# Patient Record
Sex: Female | Born: 1955
Health system: Southern US, Community
[De-identification: ages and names within clinical notes are randomized; demographics above are authoritative.]

## PROBLEM LIST (undated history)

## (undated) DIAGNOSIS — N183 Chronic kidney disease, stage 3 unspecified: Secondary | ICD-10-CM

## (undated) DIAGNOSIS — M1990 Unspecified osteoarthritis, unspecified site: Secondary | ICD-10-CM

## (undated) DIAGNOSIS — E669 Obesity, unspecified: Secondary | ICD-10-CM

## (undated) DIAGNOSIS — I10 Essential (primary) hypertension: Secondary | ICD-10-CM

## (undated) DIAGNOSIS — F79 Unspecified intellectual disabilities: Secondary | ICD-10-CM

## (undated) DIAGNOSIS — J45909 Unspecified asthma, uncomplicated: Secondary | ICD-10-CM

## (undated) DIAGNOSIS — R011 Cardiac murmur, unspecified: Secondary | ICD-10-CM

## (undated) DIAGNOSIS — G4733 Obstructive sleep apnea (adult) (pediatric): Secondary | ICD-10-CM

## (undated) DIAGNOSIS — E1169 Type 2 diabetes mellitus with other specified complication: Secondary | ICD-10-CM

## (undated) DIAGNOSIS — I5032 Chronic diastolic (congestive) heart failure: Secondary | ICD-10-CM

## (undated) DIAGNOSIS — D509 Iron deficiency anemia, unspecified: Principal | ICD-10-CM

## (undated) DIAGNOSIS — E119 Type 2 diabetes mellitus without complications: Secondary | ICD-10-CM

## (undated) DIAGNOSIS — K219 Gastro-esophageal reflux disease without esophagitis: Secondary | ICD-10-CM

## (undated) DIAGNOSIS — I4891 Unspecified atrial fibrillation: Secondary | ICD-10-CM

## (undated) DIAGNOSIS — I2510 Atherosclerotic heart disease of native coronary artery without angina pectoris: Secondary | ICD-10-CM

## (undated) HISTORY — DX: Essential (primary) hypertension: I10

## (undated) HISTORY — PX: ABDOMINAL HYSTERECTOMY: SHX81

## (undated) HISTORY — DX: Unspecified asthma, uncomplicated: J45.909

## (undated) HISTORY — DX: Iron deficiency anemia, unspecified: D50.9

## (undated) HISTORY — PX: KNEE SURGERY: SHX244

## (undated) HISTORY — DX: Unspecified osteoarthritis, unspecified site: M19.90

## (undated) HISTORY — PX: CHOLECYSTECTOMY: SHX55

## (undated) HISTORY — DX: Unspecified intellectual disabilities: F79

---

## 2006-07-16 ENCOUNTER — Ambulatory Visit (HOSPITAL_COMMUNITY): Admission: RE | Admit: 2006-07-16 | Discharge: 2006-07-16 | Payer: Self-pay | Admitting: Family Medicine

## 2008-06-21 ENCOUNTER — Encounter (HOSPITAL_COMMUNITY): Admission: RE | Admit: 2008-06-21 | Discharge: 2008-07-21 | Payer: Self-pay | Admitting: Family Medicine

## 2008-06-21 ENCOUNTER — Ambulatory Visit (HOSPITAL_COMMUNITY): Payer: Self-pay | Admitting: Family Medicine

## 2008-06-26 ENCOUNTER — Ambulatory Visit (HOSPITAL_COMMUNITY): Admission: RE | Admit: 2008-06-26 | Discharge: 2008-06-26 | Payer: Self-pay | Admitting: Family Medicine

## 2010-04-07 ENCOUNTER — Ambulatory Visit (HOSPITAL_COMMUNITY)
Admission: RE | Admit: 2010-04-07 | Discharge: 2010-04-07 | Payer: Self-pay | Source: Home / Self Care | Attending: Family Medicine | Admitting: Family Medicine

## 2010-07-10 LAB — CROSSMATCH
ABO/RH(D): A POS
Antibody Screen: NEGATIVE

## 2010-07-10 LAB — IRON AND TIBC
Iron: <10 ug/dL — ABNORMAL LOW (ref 42–135)
UIBC: 414 ug/dL

## 2010-07-10 LAB — HEMOGLOBIN AND HEMATOCRIT, BLOOD
HCT: 23.3 % — ABNORMAL LOW (ref 36.0–46.0)
HCT: 24 % — ABNORMAL LOW (ref 36.0–46.0)
Hemoglobin: 7 g/dL — CL (ref 12.0–15.0)
Hemoglobin: 7.3 g/dL — CL (ref 12.0–15.0)

## 2010-07-10 LAB — RETICULOCYTES
RBC.: 3.73 MIL/uL — ABNORMAL LOW (ref 3.87–5.11)
Retic Count, Absolute: 70.9 10*3/uL (ref 19.0–186.0)
Retic Ct Pct: 1.9 % (ref 0.4–3.1)

## 2010-07-10 LAB — FERRITIN: Ferritin: 5 ng/mL — ABNORMAL LOW (ref 10–291)

## 2010-07-10 LAB — ABO/RH: ABO/RH(D): A POS

## 2010-07-10 LAB — FOLATE

## 2010-07-10 LAB — VITAMIN B12: Vitamin B-12: 332 pg/mL (ref 211–911)

## 2010-08-29 HISTORY — PX: ESOPHAGOGASTRODUODENOSCOPY: SHX1529

## 2010-08-29 HISTORY — PX: COLONOSCOPY: SHX174

## 2010-09-05 ENCOUNTER — Inpatient Hospital Stay (HOSPITAL_COMMUNITY): Payer: Medicare PPO

## 2010-09-05 ENCOUNTER — Inpatient Hospital Stay (HOSPITAL_COMMUNITY)
Admission: RE | Admit: 2010-09-05 | Discharge: 2010-09-12 | DRG: 812 | Disposition: A | Discharge status: Home / Self Care | Payer: Medicare PPO | Source: Ambulatory Visit | Attending: Family Medicine | Admitting: Family Medicine

## 2010-09-05 DIAGNOSIS — I10 Essential (primary) hypertension: Secondary | ICD-10-CM | POA: Diagnosis present

## 2010-09-05 DIAGNOSIS — D509 Iron deficiency anemia, unspecified: Principal | ICD-10-CM | POA: Diagnosis present

## 2010-09-05 DIAGNOSIS — D126 Benign neoplasm of colon, unspecified: Secondary | ICD-10-CM | POA: Diagnosis present

## 2010-09-05 DIAGNOSIS — F79 Unspecified intellectual disabilities: Secondary | ICD-10-CM | POA: Diagnosis present

## 2010-09-05 DIAGNOSIS — D529 Folate deficiency anemia, unspecified: Secondary | ICD-10-CM | POA: Diagnosis present

## 2010-09-05 DIAGNOSIS — J40 Bronchitis, not specified as acute or chronic: Secondary | ICD-10-CM | POA: Diagnosis present

## 2010-09-05 LAB — COMPREHENSIVE METABOLIC PANEL
ALT: 7 U/L (ref 0–35)
AST: 12 U/L (ref 0–37)
Albumin: 3.6 g/dL (ref 3.5–5.2)
Alkaline Phosphatase: 91 U/L (ref 39–117)
BUN: 12 mg/dL (ref 6–23)
CO2: 27 mEq/L (ref 19–32)
Calcium: 9.2 mg/dL (ref 8.4–10.5)
Chloride: 104 mEq/L (ref 96–112)
Creatinine, Ser: 0.72 mg/dL (ref 0.4–1.2)
GFR calc Af Amer: >60 mL/min (ref >60)
GFR calc non Af Amer: >60 mL/min (ref >60)
Glucose, Bld: 149 mg/dL — ABNORMAL HIGH (ref 70–99)
Potassium: 3.9 mEq/L (ref 3.5–5.1)
Sodium: 138 mEq/L (ref 135–145)
Total Bilirubin: 0.2 mg/dL — ABNORMAL LOW (ref 0.3–1.2)
Total Protein: 7.4 g/dL (ref 6.0–8.3)

## 2010-09-05 LAB — CBC
HCT: 22.2 % — ABNORMAL LOW (ref 36.0–46.0)
Hemoglobin: 5.8 g/dL — CL (ref 12.0–15.0)
MCH: 17.1 pg — ABNORMAL LOW (ref 26.0–34.0)
MCHC: 26.1 g/dL — ABNORMAL LOW (ref 30.0–36.0)
MCV: 65.3 fL — ABNORMAL LOW (ref 78.0–100.0)
Platelets: 291 10*3/uL (ref 150–400)
RBC: 3.4 MIL/uL — ABNORMAL LOW (ref 3.87–5.11)
RDW: 19.6 % — ABNORMAL HIGH (ref 11.5–15.5)
WBC: 7.6 10*3/uL (ref 4.0–10.5)

## 2010-09-05 LAB — DIFFERENTIAL
Basophils Absolute: 0 10*3/uL (ref 0.0–0.1)
Basophils Relative: 0 % (ref 0–1)
Eosinophils Absolute: 0.2 10*3/uL (ref 0.0–0.7)
Eosinophils Relative: 2 % (ref 0–5)
Lymphocytes Relative: 21 % (ref 12–46)
Lymphs Abs: 1.6 10*3/uL (ref 0.7–4.0)
Monocytes Absolute: 0.7 10*3/uL (ref 0.1–1.0)
Monocytes Relative: 9 % (ref 3–12)
Neutro Abs: 5.1 10*3/uL (ref 1.7–7.7)
Neutrophils Relative %: 67 % (ref 43–77)

## 2010-09-05 LAB — URINALYSIS, ROUTINE W REFLEX MICROSCOPIC
Bilirubin Urine: NEGATIVE
Glucose, UA: NEGATIVE mg/dL
Hgb urine dipstick: NEGATIVE
Ketones, ur: NEGATIVE mg/dL
Leukocytes, UA: NEGATIVE
Nitrite: NEGATIVE
Protein, ur: NEGATIVE mg/dL
Specific Gravity, Urine: 1.025 (ref 1.005–1.030)
Urobilinogen, UA: 0.2 mg/dL (ref 0.0–1.0)
pH: 6 (ref 5.0–8.0)

## 2010-09-05 LAB — HEMOGLOBIN AND HEMATOCRIT, BLOOD
HCT: 24.9 % — ABNORMAL LOW (ref 36.0–46.0)
Hemoglobin: 6.8 g/dL — CL (ref 12.0–15.0)

## 2010-09-06 DIAGNOSIS — D509 Iron deficiency anemia, unspecified: Secondary | ICD-10-CM

## 2010-09-06 DIAGNOSIS — R1310 Dysphagia, unspecified: Secondary | ICD-10-CM

## 2010-09-06 LAB — HEMOGLOBIN AND HEMATOCRIT, BLOOD
HCT: 26.4 % — ABNORMAL LOW (ref 36.0–46.0)
HCT: 30.2 % — ABNORMAL LOW (ref 36.0–46.0)
Hemoglobin: 7.5 g/dL — ABNORMAL LOW (ref 12.0–15.0)
Hemoglobin: 9 g/dL — ABNORMAL LOW (ref 12.0–15.0)

## 2010-09-06 LAB — FERRITIN: Ferritin: 3 ng/mL — ABNORMAL LOW (ref 10–291)

## 2010-09-06 LAB — IRON AND TIBC
Iron: <10 ug/dL — ABNORMAL LOW (ref 42–135)
UIBC: 417 ug/dL

## 2010-09-06 LAB — FOLATE: Folate: 4.8 ng/mL

## 2010-09-06 LAB — VITAMIN B12: Vitamin B-12: 243 pg/mL (ref 211–911)

## 2010-09-07 LAB — HEMOGLOBIN AND HEMATOCRIT, BLOOD
HCT: 33.1 % — ABNORMAL LOW (ref 36.0–46.0)
Hemoglobin: 9.5 g/dL — ABNORMAL LOW (ref 12.0–15.0)

## 2010-09-07 LAB — CROSSMATCH
ABO/RH(D): A POS
Antibody Screen: NEGATIVE
Unit division: 0
Unit division: 0
Unit division: 0

## 2010-09-08 ENCOUNTER — Other Ambulatory Visit: Payer: Self-pay | Admitting: Internal Medicine

## 2010-09-08 DIAGNOSIS — D509 Iron deficiency anemia, unspecified: Secondary | ICD-10-CM

## 2010-09-08 DIAGNOSIS — D126 Benign neoplasm of colon, unspecified: Secondary | ICD-10-CM

## 2010-09-09 DIAGNOSIS — D509 Iron deficiency anemia, unspecified: Secondary | ICD-10-CM

## 2010-09-10 ENCOUNTER — Encounter: Payer: Self-pay | Admitting: Internal Medicine

## 2010-09-10 ENCOUNTER — Inpatient Hospital Stay (HOSPITAL_COMMUNITY): Payer: Medicare PPO

## 2010-09-10 DIAGNOSIS — D509 Iron deficiency anemia, unspecified: Secondary | ICD-10-CM

## 2010-09-10 LAB — DIFFERENTIAL
Basophils Absolute: 0 10*3/uL (ref 0.0–0.1)
Basophils Relative: 1 % (ref 0–1)
Eosinophils Absolute: 0.3 10*3/uL (ref 0.0–0.7)
Eosinophils Relative: 4 % (ref 0–5)
Lymphocytes Relative: 16 % (ref 12–46)
Lymphs Abs: 1.3 10*3/uL (ref 0.7–4.0)
Monocytes Absolute: 0.6 10*3/uL (ref 0.1–1.0)
Monocytes Relative: 7 % (ref 3–12)
Neutro Abs: 6.1 10*3/uL (ref 1.7–7.7)
Neutrophils Relative %: 73 % (ref 43–77)

## 2010-09-10 LAB — CBC
HCT: 30.4 % — ABNORMAL LOW (ref 36.0–46.0)
Hemoglobin: 8.7 g/dL — ABNORMAL LOW (ref 12.0–15.0)
MCH: 21 pg — ABNORMAL LOW (ref 26.0–34.0)
MCHC: 28.6 g/dL — ABNORMAL LOW (ref 30.0–36.0)
MCV: 73.4 fL — ABNORMAL LOW (ref 78.0–100.0)
Platelets: 237 10*3/uL (ref 150–400)
RBC: 4.14 MIL/uL (ref 3.87–5.11)
RDW: 25.4 % — ABNORMAL HIGH (ref 11.5–15.5)
WBC: 8.4 10*3/uL (ref 4.0–10.5)

## 2010-09-11 DIAGNOSIS — D509 Iron deficiency anemia, unspecified: Secondary | ICD-10-CM

## 2010-09-11 LAB — HEMOGLOBIN AND HEMATOCRIT, BLOOD
HCT: 36 % (ref 36.0–46.0)
Hemoglobin: 10.6 g/dL — ABNORMAL LOW (ref 12.0–15.0)

## 2010-09-12 DIAGNOSIS — D509 Iron deficiency anemia, unspecified: Secondary | ICD-10-CM

## 2010-09-12 LAB — CROSSMATCH
ABO/RH(D): A POS
Antibody Screen: NEGATIVE
Unit division: 0

## 2010-09-17 NOTE — Discharge Summary (Signed)
  NAMEILIAN, RILING              ACCOUNT NO.:  [id number]  MEDICAL RECORD NO.:  [id number]  LOCATION:  [id number]                          FACILITY:  [facility]  PHYSICIAN:  Malania Gawthrop G. Everette Rank, MD   DATE OF BIRTH:  1955-07-16  DATE OF ADMISSION:  09/05/2010 DATE OF DISCHARGE:  06/15/2012LH                              DISCHARGE SUMMARY   ADDENDUM:  HOSPITAL COURSE:  At time of her admission, she was placed on half- normal saline at 100 mL/hour.  Vital signs were monitored q.i.d.  Stool for occult blood were requested.  Compression stockings for DVT prophylaxis were started.  The patient was typed and crossmatched for 3 units of packed RBCs and was given the first unit shortly after admission.  She did have an anemia panel done which showed evidence of severe iron deficiency anemia.  She was continued on enalapril 10 mg daily and Hycodan syrup was given every 4 hours.  The patient was seen in consultation by Gastroenterology Service and set up for upper and lower endoscopy and these were completed with no evidence of gastrointestinal bleeding noted.  The patient did have elevated blood pressure and was given Norvasc 5 mg daily.  She was started on FeSO4 325 mg b.i.d.  The patient received a total of 4 units of packed RBCs while in hospital setting and was seen as well by Hematology Service, Dr. [name], who felt that this was in all likelihood a gastrointestinal bleed and recommended the capsule study which was felt it could be accomplished as an outpatient.  The patient did have CT of the abdomen which showed no acute intracranial abdominal or pelvic pathology but incidental right adrenal adenoma was noted.  The patient did have some upper respiratory symptoms during her hospital stay with occasional cough.  She was placed on folic acid 1 mg daily as well.  The cough improved during her hospital stay.  It was felt she could be discharged after 6 hospital days and further evaluation of this  problem could be done as an outpatient.  The patient did have a hemoglobin at the time of her discharge of 10.6 and hematocrit of 36.  This was up from the initial hemoglobin of 5.8 and hematocrit 22.2.  DISCHARGE MEDICATIONS:  The patient was discharged on the following medications. 1. Amlodipine 10 mg daily. 2. Folic acid 1 mg daily. 3. Enalapril 10 mg daily. 4. Ferrous sulfate 325 mg b.i.d.  CONDITION ON DISCHARGE:  The patient was stable at the time of discharge.    Savhanna Sliva G. Everette Rank, MD    AGM/MEDQ  D:  09/12/2010  T:  09/12/2010  Job:  [id number]  Electronically Signed by Marjean Donna MD on 09/17/2010 07:05:27 AM

## 2010-09-17 NOTE — H&P (Signed)
  [name], TURCOTTE              ACCOUNT NO.:  [id number]  MEDICAL RECORD NO.:  [id number]  LOCATION:  [id number]                          [facility]AC[facility]  [facility]  PHYSICIAN:  Kagan Mutchler G. Everette Rank, MD   DATE OF BIRTH:  [birth year]  DATE OF ADMISSION:  09/05/2010 DATE OF DISCHARGE:  LH                             HISTORY & PHYSICAL   A 55 year old white female was seen in the office on the day of admission with chief complaint being cough and chest congestion.  The patient on examination appeared to have unusual pallor to skin and mucous membranes.  She was felt to have anemia.  The patient was checked for occult blood in stool in the office setting and this was negative. The patient denied any symptoms of weakness or fatigue, but stated she had seen a small amount of blood after coughing for periods of time when she had had this cough over a period of approximately 1 week. Discussions were held with GI service, Dr. [name], and it was felt the patient should be admitted for transfusions and further evaluation.     [name] G. Everette Rank, MD     AGM/MEDQ  D:  09/05/2010  T:  09/06/2010  Job:  [id number]  Electronically Signed by Marjean Donna MD on 09/17/2010 07:05:13 AM

## 2010-09-17 NOTE — Group Therapy Note (Signed)
  [name], [name]              ACCOUNT NO.:  [id number]  MEDICAL RECORD NO.:  [id number]  LOCATION:  [id number]                          FACILITY:  [facility]  PHYSICIAN:  Jennah Satchell G. Everette Rank, MD   DATE OF BIRTH:  January 16, 1956  DATE OF PROCEDURE:  09/09/2010 DATE OF DISCHARGE:                                PROGRESS NOTE   SUBJECTIVE:  This patient was admitted with severe iron deficiency anemia and low hemoglobin.  She has been transfused with most recent hemoglobin of 9.5 and hematocrit of 33.1.  She did have an EGD and colonoscopy done yesterday and apparently GI service did not see the reason for her extremely low hemoglobin.  OBJECTIVE:  VITAL SIGNS:  Blood pressure 147/79, respirations 20, pulse 82, and temperature 98.4. LUNGS:  Clear to P and A. HEART:  Regular rhythm. ABDOMEN:  No palpable organs or masses.  ASSESSMENT:  The patient was admitted with severe iron deficiency anemia.  She has been transfused and is more stable. PLAN:  To obtain consultation with the Hematology Service, Dr. [name] or continue current regimen otherwise.     Azelyn Batie G. Everette Rank, MD     AGM/MEDQ  D:  09/09/2010  T:  09/09/2010  Job:  [id number]  Electronically Signed by Marjean Donna MD on 09/17/2010 07:05:36 AM

## 2010-09-17 NOTE — Group Therapy Note (Signed)
  NAMEKANDYCE, [name]              ACCOUNT NO.:  [id number]  MEDICAL RECORD NO.:  [id number]  LOCATION:  [id number]                          [facility]:  [facility]  PHYSICIAN:  Dwayne Bulkley G. Everette Rank, MD   DATE OF BIRTH:  16-Nov-1955  DATE OF PROCEDURE:  09/11/2010 DATE OF DISCHARGE:                                PROGRESS NOTE   SUBJECTIVE:  This patient was admitted with severe iron deficiency anemia and low hemoglobin.  She has been transfused.  EGD and colonoscopy apparently did not see evidence of reason for extremely low hemoglobin.  The patient did have CT of the abdomen and pelvis which showed evidence of 3.0 x 2.2 cm right adrenal mass which was felt to be adenoma, an incidental finding.  She had low hemoglobin yesterday of 8.7 and hematocrit 30.4 down from 9.5 with hematocrit of 33.1.  OBJECTIVE:  VITAL SIGNS:  Blood pressure 169/91, respirations 20, pulse 78, and temperature 97.9. LUNGS:  Clear to P and A. HEART:  Regular rhythm. ABDOMEN:  No palpable organs or masses.  ASSESSMENT:  The patient does have severe iron deficiency anemia and hypertension.  PLAN:  Transfuse another unit of blood today.  Continue current regimen.     Sharnee Douglass G. Everette Rank, MD     AGM/MEDQ  D:  09/11/2010  T:  09/11/2010  Job:  [id number]  Electronically Signed by Marjean Donna MD on 09/17/2010 07:05:39 AM

## 2010-09-17 NOTE — Group Therapy Note (Signed)
  NAMEASHLEYANNE, [name] [name]              ACCOUNT NO.:  [id number]  MEDICAL RECORD NO.:  [id number]  LOCATION:  [id number]                          [facility]  PHYSICIAN:  Kyan Yurkovich G. Everette Rank, MD   DATE OF BIRTH:  1955-06-10  DATE OF PROCEDURE: DATE OF DISCHARGE:                                PROGRESS NOTE   This patient was admitted with severe iron deficiency anemia.  She has been transfused and her hemoglobin now is 9.5 with hematocrit 33.1.  The patient feels better.  OBJECTIVE:  VITAL SIGNS:  Blood pressure 181/96, respiration 20, pulse 73, temp 98. LUNGS:  Clear to P&A. HEART:  Regular rhythm. ABDOMEN:  No palpable organs or masses.  ASSESSMENT:  The patient was admitted with severe iron deficiency anemia.  She had markedly depleted iron stores.  Plan is to proceed with endoscopy studies today.     [name] [name] G. Everette Rank, MD     AGM/MEDQ  D:  09/08/2010  T:  09/08/2010  Job:  [id number]  Electronically Signed by Marjean Donna MD on 09/17/2010 07:05:33 AM

## 2010-09-17 NOTE — Discharge Summary (Signed)
  [name], [name]              ACCOUNT NO.:  [id number]  MEDICAL RECORD NO.:  [id number]  LOCATION:  [id number]                          [facility]  PHYSICIAN:  Dwan Hemmelgarn G. Everette Rank, MD   DATE OF BIRTH:  [birth year]  DATE OF ADMISSION:  09/05/2010 DATE OF DISCHARGE:  06/15/2012LH                              DISCHARGE SUMMARY   DIAGNOSES: 1. Severe iron deficiency anemia requiring multiple transfusions. 2. Bronchitis with previous history of pneumonia.  CONDITION:  Stable and improved at the time of her discharge.  HOSPITAL COURSE:  This patient was seen in the office on day of admission with a chief complaint being cough and chest congestion.  The patient on admission had an unusual pallor to her skin and mucous membranes.  She was felt to be anemic.  The patient was checked for occult blood in her stool in the office and this was negative.  The patient denied any symptoms of weakness or fatigue but stated she had seen a small amount of blood after coughing for periods of time.  She has had this cough over a period of approximate 1 week.  The patient had office labs done which showed a hemoglobin of 5.9 with hematocrit 22.4, MCV 65.1, MCH 17.2, and MCHC 26.3.  This situation was discussed with GI service and it was felt she should be admitted to the hospital for blood transfusions and further evaluation.  LABORATORY DATA:  Admission CBC:  WBC 7.6 with RBC 3.40 with hemoglobin 5.8, hematocrit 22.2, MCV 65.3, MCH 17.1, MCHC 26.1, neutrophils 67, lymphocytes 21, and eosinophils 9.  UA essentially negative.  Anemia panel showed a serum iron less than 10.  Iron-binding capacity was not calculated, folate was 4.8, vitamin B12 243, and ferritin 3.  Subsequent hemoglobin on September 06, 2010 was 9.0 following transfusions, hematocrit 30.2 and on September 10, 2010, hemoglobin was 8.7 and hematocrit 30.4.  X-RAYS:  Chest x-ray on admission:  No active disease or significant change.  CT of  abdomen and pelvis:  No acute intra-abdominal or pelvic apology.  Her right adrenal adenoma incidentally noted.  No specific further followup needed with this finding.     Jabier Deese G. Everette Rank, MD     AGM/MEDQ  D:  09/11/2010  T:  09/12/2010  Job:  [id number]  Electronically Signed by Marjean Donna MD on 09/17/2010 07:05:21 AM

## 2010-09-17 NOTE — Consult Note (Signed)
[name], MINOR              ACCOUNT NO.:  [id number]  MEDICAL RECORD NO.:  [id number]  LOCATION:  [id number]                          [facility]  [facility]  PHYSICIAN:  [name]. Anne Stalker, MD  DATE OF BIRTH:  [birth year]  DATE OF CONSULTATION:  09/10/2010 DATE OF DISCHARGE:                                CONSULTATION   REFERRING PHYSICIAN:  [name], MD  REASON FOR REFERRAL:  Anemia.  HISTORY OF PRESENT ILLNESS:  This is a 55 year old Caucasian female who was recently seen by her primary care physician in the office for a cough and chest congestion.  At that point in time, primary care physician noticed unusual pallor of the skin and mucous membranes.  It was at that point felt that she was anemic.  As a result, she was sent to the emergency room for further evaluation.  The patient was checked for occult blood in stool in the office setting and was negative.  At that point in time, she denied any weakness or fatigue.  The patient then noted a small amount of blood after coughing for a period of time approximately 1 week prior to being seen by her primary care physician.  The patient explains to me today that she feels much better after receiving blood transfusion.  She denies any blood loss including gingival bleeding, vaginal bleeding, or rectal bleeding.  She denies any vomiting.  She denies a sore tongue.  She admits to craving ice occasionally, but I would not make that, but the way she describes, I would not relate this to iron deficiency.  She does also admit to nail biting, and therefore her fingernails are difficult to assess for flattening of the nail.  She does have what appears to be a fungal infection of the nails.  Her toenails, however, do not show any flattening of the nail or spooning.  The patient has not had any recent menstrual cycles.  She says she stopped menstruating in the 1990s.  She does occasionally use aspirin or ibuprofen.  She does admit to  occasional Rolaids for antacid relief. She denies being a vegetarian.  The patient denies any headache, dizziness, double vision, fevers, chills, night sweats, nausea, vomiting, diarrhea, constipation, abdominal pain, chest pain, heart palpitations, shortness of breath, blood in stool, black tarry stool, urinary pain, urinary burning, urinary frequency, hematuria.  The patient was evaluated by Dr. [name] who performed a colonoscopy and EGD to evaluate for gastrointestinal bleeding.  The examination revealed a normal rectum with a polyp in the sigmoid colon, otherwise normal colon and terminal ileum.  EGD findings reveal a normal esophagus, small hiatal hernia, antral erosions, duodenal erosions.  According to Dr. Roseanne [name] note, he found nothing in the upper or lower gastrointestinal tract which would explain iron- deficiency anemia to this degree via a mechanism of gastrointestinal bleeding.  As a result of his findings, Hematology/Oncology Service was consulted.  PAST MEDICAL HISTORY: 1. History of pneumonia. 2. Dyslipidemia.  ALLERGIES:  No known drug allergies.  CURRENT MEDICATIONS:  The patient denies having any home medications.  INPATIENT MEDICATIONS: 1. Amlodipine 10 mg daily. 2. Enalapril 10 mg daily. 3. Demerol 200 mg. 4. Versed 10  mg. 5. Hycodan 5 mL every 4 hours p.r.n.  PAST SURGICAL HISTORY: 1. Cholecystectomy. 2. Partial hysterectomy with ovaries remaining. 3. Right knee surgery.  FAMILY HISTORY:  The patient tells me that her mother and father are alive.  Her mother and father both have heart disease.  Her mother is 38 years old and has diabetes.  Her father is 57 years old.  The patient denies any siblings.  She does admit to having a son who is approximately 12 years old.  She explains me that he has cysts on his kidneys and lungs and is status post surgery at [place]:  The patient was born and currently resides in [place], [place].  She graduated high school via a [employer].  The patient is on disability, likely due to her mental retardation.  The patient is divorced.  She lives with her mother, father, and son.  She denies any alcohol abuse or illicit drug abuse.  She does admit to a pack-per-day smoking history for 10 years, many years ago.  GYNECOLOGIC HISTORY:  The patient is unable to tell me when she reached menarche.  She tells me she reached menopause after her hysterectomy. She is G1, P1.  She did not breastfeed her child.  The patient has had mammogram in the past.  She has not had Pap smear in a long time.  SCREENING TESTS:  The patient has undergone a colonoscopy most recently by Dr. [name].  The pathology from colon polypectomy reveals a tubular adenoma.  No high-grade dysplasia or malignancy identified.  Small intestine biopsy reveals a benign small bowel mucosa.  No villous atrophy, inflammation, or other abnormalities present.  Stomach biopsy reveals moderate chronic gastritis.  No evidence of Helicobacter pylori, intestinal metaplasia, dysplasia, or malignancy.  The patient's last mammogram appears to be June 26, 2008, and was categorizes a BI-RADS 1.  PERFORMANCE STATUS:  The patient's ECOG performance status of 3.  She explains to me that she spends 50% or more of her day light hours sitting in a chair.  PSYCHOSOCIAL HISTORY:  Unable to evaluate psychosocial history.  The patient lives with her 50 and 41 year old parents.  She also lives with her mentally retarded son as well.  PHYSICAL EXAMINATION:  VITAL SIGNS:  Vitals obtained at 0528 hours reveals a temperature 98.1, pulse 80, respirations 20, blood pressure 157/76, oxygen saturation 98% on room air. GENERAL:  The patient is seen lying in bed eating breakfast.  She is alert and oriented x3.  She appears very pleasant.  She is mentally challenged.  She does not appear to be in acute distress. HEENT:   Atraumatic, normocephalic.  Anicteric sclerae. NECK:  Trachea midline. CARDIAC:  Regular rate and rhythm with a 1/6 systolic ejection murmur heard best at the left sternal border.  No S3 or S4 appreciated. LUNGS:  Clear to auscultation bilaterally without wheezes, rales or rhonchi.  No accessory muscle use appreciated. ABDOMEN:  Positive bowel sounds in all 4 quadrants.  Soft.  Nontender. No appreciable hepatosplenomegaly.  Obese. EXTREMITIES:  No upper or lower extremity edema appreciated bilaterally. No tenderness to palpation of the calves or popliteal fossa bilaterally. The pedal pulses are intact bilaterally.  The fingernails reveal biting trauma and question of a fungal infection of the fingernails.  Toenails did not reveal any flattening of the nail bed or spooning. LYMPHATICS:  No infraclavicular or supraclavicular lymphadenopathy noted.  No epitrochlear nodes appreciated. SKIN:  Capillary refill less than 1  second fingers.  Skin is warm and dry.  Rapid turgor.  Radial pulse regular strong 2+. NEURO:  No focal deficits appreciated.  The patient is alert and oriented x3.  LABORATORY DATA:  Laboratory data obtained on September 07, 2010, revealed a hemoglobin 9.5, hematocrit 33.1.  Laboratory work obtained on September 06, 2010 reveals a hemoglobin 7.5, hematocrit 26.4.  Anemia panel performed on September 05, 2010, reveals an iron of less than 10, vitamin B12 243, serum folate 4.8, ferritin 3.  Complete metabolic panel obtained on September 05, 2010, reveals sodium 138, potassium 3.9, chloride 104, bicarbonate 27, BUN 12, creatinine 0.72, glucose 149.  RADIOGRAPHIC STUDIES:  Chest x-ray performed on September 05, 2010, reveals no acute disease.  No significant change.  ASSESSMENT: 1. Microcystic hypochromic anemia. 2. Iron-deficiency anemia. 3. Borderline folate deficiency. 4. Mentally challenged.  PLAN:  The plan is as follows. 1. I will discuss the patient with Dr. Tressie [name].  I suspect we will      perform a CT of abdomen and pelvis with contrast to further     evaluate whether there is an intra-abdominal or intrapelvic cause     for her iron-deficiency anemia, namely bleeding. 2. We will likely introduce 1 mg of folic acid daily to help resolve     any nutritional deficiency in this regard.  All questions were answered.  The patient knows to call the clinic with any problems, questions, or concerns.  I will discuss the patient with Dr. Tressie [name].    ______________________________ Manon Hilding Kefalas III, PA-C   ______________________________ [name]. Anne Stalker, MD    TSK/MEDQ  D:  09/10/2010  T:  09/11/2010  Job:  [id number]  Electronically Signed by Robynn Pane III PA on 09/17/2010 02:07:31 PM Electronically Signed by Everardo All MD on 09/17/2010 02:27:55 PM

## 2010-09-17 NOTE — Group Therapy Note (Signed)
  [name], [name]              ACCOUNT NO.:  [id number]  MEDICAL RECORD NO.:  [id number]  LOCATION:  [id number]                          [facility]AC[facility]  PHYSICIAN:  Syrai Gladwin G. Everette Rank, MD   DATE OF BIRTH:  [birth year]  DATE OF PROCEDURE: DATE OF DISCHARGE:                                PROGRESS NOTE   This patient was admitted with severe anemia with hemoglobin on admission being 5.8 with hematocrit 22.2.  She did have an anemia panel done since admission which showed serum iron less than 10.  She has received 2 units of packed RBCs since admission and is feeling some better.  There is no evidence of GI bleeding at present time.  OBJECTIVE:  VITAL SIGNS:  Blood pressure 159/75, respirations 16, pulse 75, temperature 97.9. HEART:  Regular rhythm. LUNGS:  Occasional rhonchus heard over lower lung field and upper lobes. ABDOMEN:  No palpable organs or masses. SKIN:  Warm and dry.  ASSESSMENT:  The patient was admitted with severe anemia which appears to be iron deficiency-type anemia.  She has been transfused.  A chest x- ray done shows no active disease.  The patient will be seen by a Gastroenterology Service with reference to this problem.  Continue to monitor hemoglobin and hematocrit.     Alizza Sacra G. Everette Rank, MD     AGM/MEDQ  D:  09/06/2010  T:  09/06/2010  Job:  [id number]  Electronically Signed by Marjean Donna MD on 09/17/2010 07:05:30 AM

## 2010-09-17 NOTE — H&P (Signed)
  [name], [name]              ACCOUNT NO.:  [id number]  MEDICAL RECORD NO.:  [id number]  LOCATION:  [id number]                          [facility]  [facility]  PHYSICIAN:  Anterrio Mccleery G. Everette Rank, MD   DATE OF BIRTH:  [birth year]  DATE OF ADMISSION:  09/05/2010 DATE OF DISCHARGE:  LH                             HISTORY & PHYSICAL   ADDENDUM  The patient had office labs done which showed a hemoglobin of 5.9 with hematocrit of 22.4, MCV 65.1, MCH 17.2, and MCHC 26.3.  The patient was as stated examined in the office setting and was heme negative on stool but she stated she had had noted some blood after coughing episodes and she was therefore admitted with these problems.  SOCIAL HISTORY:  The patient was a former cigarette smoker but does not smoke now.  Does not drink alcohol or use drugs.  She has no known allergies.  FAMILY HISTORY:  Not obtainable.  PAST SURGICAL HISTORY:  The patient has previous history of cholecystectomy, hysterectomy, and previous knee surgery.  She has had previous pneumonia and has had dyslipidemia.  REVIEW OF SYSTEMS:  HEENT: Negative.  CARDIOPULMONARY:  The patient has episodes of coughing recently.  GI:  No nausea, vomiting, or diarrhea. GU:  No dysuria, frequency, or urgency.  PHYSICAL EXAMINATION:  GENERAL:  An alert female. VITAL SIGNS:  Blood pressure 140/80, pulse 60, respirations 18, and temperature 98. HEENT:  Eyes, PERRLA.  Oropharynx benign.  Pallor of mucous membranes. NECK:  Supple.  No JVD or thyroid abnormalities. HEART:  Regular rhythm.  No murmurs. LUNGS:  Clear to P and A. ABDOMEN:  No palpable organs or masses.  No organomegaly. EXTERNAL GENITALIA:  Normal. RECTAL:  Negative.  No evidence of bleeding. EXTREMITIES:  Free of edema.  ASSESSMENT:  The patient is being admitted to hospital with severe anemia, iron deficiency type.  She is admitted for transfusion and further evaluation.  I have discussed this with Dr. [name] of the GI service  and we will see if the patient following admission for further evaluation.     Sari Cogan G. Everette Rank, MD     AGM/MEDQ  D:  09/05/2010  T:  09/06/2010  Job:  [id number]  Electronically Signed by Marjean Donna MD on 09/17/2010 07:05:18 AM

## 2010-09-18 ENCOUNTER — Other Ambulatory Visit (HOSPITAL_COMMUNITY): Payer: Self-pay | Admitting: Oncology

## 2010-09-18 ENCOUNTER — Telehealth: Payer: Self-pay | Admitting: Gastroenterology

## 2010-09-18 ENCOUNTER — Encounter (HOSPITAL_COMMUNITY): Payer: Medicare PPO | Attending: Oncology | Admitting: Oncology

## 2010-09-18 DIAGNOSIS — E785 Hyperlipidemia, unspecified: Secondary | ICD-10-CM | POA: Insufficient documentation

## 2010-09-18 DIAGNOSIS — D649 Anemia, unspecified: Secondary | ICD-10-CM | POA: Insufficient documentation

## 2010-09-18 DIAGNOSIS — Z79899 Other long term (current) drug therapy: Secondary | ICD-10-CM | POA: Insufficient documentation

## 2010-09-18 DIAGNOSIS — D509 Iron deficiency anemia, unspecified: Secondary | ICD-10-CM

## 2010-09-18 LAB — CBC
HCT: 37 % (ref 36.0–46.0)
Hemoglobin: 10.8 g/dL — ABNORMAL LOW (ref 12.0–15.0)
MCH: 22.4 pg — ABNORMAL LOW (ref 26.0–34.0)
MCHC: 29.2 g/dL — ABNORMAL LOW (ref 30.0–36.0)
MCV: 76.6 fL — ABNORMAL LOW (ref 78.0–100.0)
Platelets: 380 10*3/uL (ref 150–400)
RBC: 4.83 MIL/uL (ref 3.87–5.11)
RDW: 25.5 % — ABNORMAL HIGH (ref 11.5–15.5)
WBC: 8 10*3/uL (ref 4.0–10.5)

## 2010-09-18 LAB — COMPREHENSIVE METABOLIC PANEL
ALT: 13 U/L (ref 0–35)
AST: 22 U/L (ref 0–37)
Albumin: 3.9 g/dL (ref 3.5–5.2)
Alkaline Phosphatase: 92 U/L (ref 39–117)
BUN: 12 mg/dL (ref 6–23)
CO2: 25 mEq/L (ref 19–32)
Calcium: 10 mg/dL (ref 8.4–10.5)
Chloride: 105 mEq/L (ref 96–112)
Creatinine, Ser: 0.67 mg/dL (ref 0.50–1.10)
GFR calc Af Amer: >60 mL/min (ref >60)
GFR calc non Af Amer: >60 mL/min (ref >60)
Glucose, Bld: 126 mg/dL — ABNORMAL HIGH (ref 70–99)
Potassium: 4.2 mEq/L (ref 3.5–5.1)
Sodium: 139 mEq/L (ref 135–145)
Total Bilirubin: 0.3 mg/dL (ref 0.3–1.2)
Total Protein: 7.3 g/dL (ref 6.0–8.3)

## 2010-09-18 LAB — DIFFERENTIAL
Basophils Absolute: 0.1 10*3/uL (ref 0.0–0.1)
Basophils Relative: 1 % (ref 0–1)
Eosinophils Absolute: 0.2 10*3/uL (ref 0.0–0.7)
Eosinophils Relative: 2 % (ref 0–5)
Lymphocytes Relative: 16 % (ref 12–46)
Lymphs Abs: 1.3 10*3/uL (ref 0.7–4.0)
Monocytes Absolute: 0.6 10*3/uL (ref 0.1–1.0)
Monocytes Relative: 7 % (ref 3–12)
Neutro Abs: 5.9 10*3/uL (ref 1.7–7.7)
Neutrophils Relative %: 74 % (ref 43–77)

## 2010-09-18 LAB — IRON AND TIBC
Iron: 137 ug/dL — ABNORMAL HIGH (ref 42–135)
Saturation Ratios: 38 % (ref 20–55)
TIBC: 358 ug/dL (ref 250–470)
UIBC: 221 ug/dL

## 2010-09-18 LAB — RETICULOCYTES
RBC.: 4.83 MIL/uL (ref 3.87–5.11)
Retic Count, Absolute: 33.8 10*3/uL (ref 19.0–186.0)
Retic Ct Pct: 0.7 % (ref 0.4–3.1)

## 2010-09-18 LAB — FOLATE: Folate: >20 ng/mL

## 2010-09-18 LAB — VITAMIN B12: Vitamin B-12: 371 pg/mL (ref 211–911)

## 2010-09-18 LAB — FERRITIN: Ferritin: 973 ng/mL — ABNORMAL HIGH (ref 10–291)

## 2010-09-18 NOTE — Telephone Encounter (Signed)
Patient was seen last week in hospital for anemia (IDA). Needs heme X 3 and OV to follow to discuss if sb capsule endoscopy indicated.

## 2010-09-23 NOTE — Telephone Encounter (Signed)
Pt aware, heme cards in mail. Please schedule appt.

## 2010-10-06 ENCOUNTER — Ambulatory Visit: Payer: Medicare PPO | Admitting: Internal Medicine

## 2010-10-06 DIAGNOSIS — D649 Anemia, unspecified: Secondary | ICD-10-CM

## 2010-10-06 LAB — POC HEMOCCULT BLD/STL (HOME/3-CARD/SCREEN)
Card #2 Fecal Occult Blod, POC: NEGATIVE
Card #3 Fecal Occult Blood, POC: NEGATIVE
Fecal Occult Blood, POC: NEGATIVE

## 2010-10-06 NOTE — Progress Notes (Signed)
Pt returned 3 heme cards and they were negative

## 2010-10-13 NOTE — Op Note (Signed)
[name], [name]              ACCOUNT NO.:  [id number]  MEDICAL RECORD NO.:  [id number]  LOCATION:  [id number]                          FACILITY:  [facility]  PHYSICIAN:  R. Garfield Cornea, MD [place]:  04/30/1955  DATE OF PROCEDURE:  09/05/2010 DATE OF DISCHARGE:                              OPERATIVE REPORT   Ileocolonoscopy with snare polypectomy followed by EGD with gastric small bowel biopsy.  INDICATIONS FOR PROCEDURE:  A 55 year old lady presented with symptomatic severe iron deficiency anemia, Hemoccult negative. Clinically, no other GI symptoms.  Also, B12 level lower limit of normal.  Colonoscopy and possible EGD now to follow and further evaluate iron deficiency anemia.  Risks, benefits, limitations, alternatives, and ponderables have been discussed previously and again today at the bedside.  Please see the documentation in the medical record.  PROCEDURE NOTE:  O2 saturation, blood pressure, pulse, respirations were monitored throughout the entirety of the procedure.  CONSCIOUS SEDATION:  Versed 6 mg IV, Demerol 125 mg IV in divided doses. Cetacaine spray for topical pharyngeal anesthesia.  INSTRUMENT:  Pentax video chip system.  FINDINGS:  Digital rectal exam revealed no abnormalities other than single anal papilla.  Endoscopic Findings:  Prep was adequate.  Colon:  Colonic mucosa was surveyed from the rectosigmoid junction through the left transverse, right colon, appendiceal orifice, ileocecal valve/cecum.  These structures were well seen and photographed for the record.  Terminal ileum was intubated to 15 cm from this level.  Scope was slowly and cautiously withdrawn.  All previously mentioned mucosal surfaces were again seen.  The patient had a 6-mm polyp in the midsigmoid which was hot snared and recovered all the way in.  The remainder of colonic mucosa and terminal ileum mucosa, however, appeared otherwise normal.  Scope was pulled down to the  rectum where thorough examination of rectal mucosa including retroflex view of the anal verge demonstrated no abnormalities.  The patient tolerated the procedure well.  Cecal withdrawal time 8 minutes.  The patient was subsequently prepared for EGD.  EGD Findings:  Examination of tubular esophagus revealed no mucosal abnormalities.  EG junction was easily traversed.  Stomach:  Gas cavity was emptied and insufflated well with air. Thorough examination of gastric mucosa including the retroflexed view of the proximal stomach and esophagogastric junction demonstrated only a small hiatal hernia and a couple of antral erosions.  There was no ulcer infiltrating process or other abnormality.  Pylorus was patent and easily traversed.  Examination of the bulb, second, and third portion revealed some bulbar and D2 erosions.  Otherwise, D1 through D3 appeared entirely normal.  THERAPY/DIAGNOSTIC MANEUVERS PERFORMED:  Biopsies of D2 and D3 were taken and subsequently biopsies of the antral mucosa were taken.  The patient tolerated both procedures well and was reactive to endoscopy.  IMPRESSION: 1. Colonoscopy, anal papilla, otherwise normal rectum. 2. Polyp in sigmoid colon status post hot snare polypectomy, otherwise     normal colon and terminal ileum.  EGD findings; normal esophagus,     small hiatal hernia, antral erosions, duodenal erosions as     described above status post biopsy.  I found nothing in the upper or lower gastrointestinal tract which  would explain iron deficiency anemia to this degree via a mechanism of gastrointestinal bleeding.  RECOMMENDATIONS: 1. Followup on path. 2. Advance diet. 3. She may ultimately need a Hematology consultation.     Bridgette Habermann, MD FACP [facility]     RMR/MEDQ  D:  09/08/2010  T:  09/08/2010  Job:  [id number]  cc:   [name], MD Fax: [phone]  Electronically Signed by Jannette Spanner M.D. on 10/13/2010 09:17:57 AM

## 2010-10-13 NOTE — H&P (Signed)
NAMESICILIA, TWIGGS              ACCOUNT NO.:  [id number]  MEDICAL RECORD NO.:  [id number]  LOCATION:  [id number]                          FACILITY:  [facility]  PHYSICIAN:  R. Garfield Cornea, MD FACP [place] BIRTH:  Jan 20, 1956  DATE OF ADMISSION:  09/05/2010 DATE OF DISCHARGE:  LH                             HISTORY & PHYSICAL   REASON FOR CONSULTATION:  Profound iron deficiency anemia.  HISTORY OF PRESENT ILLNESS:  [name] is a pleasant 55-year lady from [place] presented to Dr. Everette Rank' office yesterday with complaint of coughing and weakness.  She was found to be extremely pale and indeed an H&H in the office revealed a hemoglobin and hematocrit of 5.8 and 22.2.  She was Hemoccult negative.  She was admitted for further evaluation.  This lady denies having any melena or hematochezia.  No hematemesis.  She denies odynophagia, dysphagia, or acid reflux symptoms, nausea, vomiting, abdominal pain, melena, hematochezia, constipation, or diarrhea.  Denies any fever, chills, or change in weight.  No family history of GI malignancy.  She has never had a colonoscopy.  Overnight, she has remained entirely stable.  No stools for Hemoccult.  She received 2 units of packed red blood cells this morning.  Her H&H was 7.5 and 26.4, 30 units has been ordered.  She does tell me she takes occasional ibuprofen to see if it would help, but not even 1 dose weekly for various aches and pains.  She denies any craving for clay or dirt.  No family history of anemia.  PAST MEDICAL HISTORY:  She denies chronic illnesses.  PAST SURGERIES:  Cholecystectomy, hysterectomy, right knee surgery. Also reportedly has history of prior pneumonia and dyslipidemia.  MEDICATIONS ON ADMISSION:  Reportedly none.  FAMILY HISTORY:  Negative for chronic GI or liver illness.  One son has problems with cysts on his kidneys and lungs and has had surgery at [facility].  Further information not available.  SOCIAL  HISTORY:  The patient is single.  She has 1 son.  She lives with her family here in [place].  Distant prior smoking history none now. No alcohol.  No illicit drugs.  REVIEW OF SYSTEMS:  Pretty much as in history of present illness.  PHYSICAL EXAMINATION:  GENERAL:  Pleasant 55 year old lady found in no acute distress.  She is ambulating in her room 324.  She is pleasant, well-oriented. VITAL SIGNS:  Temperature 97.9, pulse 75, respiratory rate 16, BP 159/75. SKIN:  Warm and dry.  No cutaneous stigmata of chronic liver disease. HEENT:  Conjunctivae are pale. CHEST:  Lungs are clear to auscultation. HEART:  Regular rate and rhythm without murmur, gallop, or rub. BREASTS:  Deferred. ABDOMEN:  Somewhat obese, positive bowel sounds, entirely soft and nontender without appreciable mass or organomegaly. EXTREMITIES:  No edema.  ADDITIONAL LABORATORY DATA:  Sodium 138, potassium 3.9, chloride 104, CO2 27, glucose 149, BUN 12, creatinine 0.72, total bilirubin 0.2, alkaline phosphatase 91, AST 12, ALT 7, total protein 7.4, albumin 3.6, calcium 9.2.  Anemia profile:  Iron less than 10, IBC not calculated, B12 level 243, serum folate 4.8, ferritin 3.  IMPRESSION:  [name] is a pleasant 55 year old lady essentially devoid  of any lower GI tract symptoms admitted to the hospital with a profound iron deficiency anemia.  She is Hemoccult negative.  Anemia profile indicates markedly depleted iron stores.  It is also notable she has lower limit at normal serum B12 and folate levels.  She does not have any symptoms of Pica.  She does not have any peculiar eating habits.  I grew with Dr. Everette Rank' this nice lady needs further evaluation starting with her GI tract.  We need to rule out an occult lesion in her colon.  She also needs to be screened for celiac disease.  She may ultimately come to Hematology consultation.  RECOMMENDATIONS:  We will plan for a diagnostic ileal colonoscopy  on September 08, 2010.  If ileocolonoscopy is unrevealing as to a cause for profound iron-deficiency anemia, she will undergo subsequent EGD with small bowel biopsies.  Risks, benefits, limitations, alternatives, and imponderables have been discussed, questions answered.  She is agreeable.  Again, she may ultimately need hematology evaluation. Further recommendations to follow.  Followup on Hemoccult is ordered here.  I would like to thank Dr. Marjean Donna for allowing me to see this nice lady today in the hospital.     R. Garfield Cornea, MD [facility]     RMR/MEDQ  D:  09/06/2010  T:  09/06/2010  Job:  [id number]  cc:   [name], MD Fax: [phone]  Electronically Signed by Jannette Spanner M.D. on 10/13/2010 09:17:53 AM

## 2010-10-14 NOTE — Progress Notes (Signed)
F/U OPV 10/21/10 W/LL

## 2010-10-14 NOTE — Telephone Encounter (Signed)
Pt is scheduled to see LL on 07/26

## 2010-10-16 ENCOUNTER — Other Ambulatory Visit (HOSPITAL_COMMUNITY): Payer: Self-pay | Admitting: Oncology

## 2010-10-16 ENCOUNTER — Encounter (HOSPITAL_COMMUNITY): Payer: Self-pay | Admitting: Oncology

## 2010-10-16 ENCOUNTER — Encounter (HOSPITAL_COMMUNITY): Payer: Medicare PPO | Attending: Oncology

## 2010-10-16 ENCOUNTER — Encounter (HOSPITAL_COMMUNITY): Payer: Medicare PPO | Admitting: Oncology

## 2010-10-16 VITALS — BP 177/100 | HR 74 | Temp 98.1°F | Ht 63.5 in | Wt 216.6 lb

## 2010-10-16 DIAGNOSIS — F79 Unspecified intellectual disabilities: Secondary | ICD-10-CM

## 2010-10-16 DIAGNOSIS — D509 Iron deficiency anemia, unspecified: Secondary | ICD-10-CM | POA: Insufficient documentation

## 2010-10-16 HISTORY — DX: Unspecified intellectual disabilities: F79

## 2010-10-16 HISTORY — DX: Iron deficiency anemia, unspecified: D50.9

## 2010-10-16 LAB — CBC
HCT: 42.5 % (ref 36.0–46.0)
Hemoglobin: 13.4 g/dL (ref 12.0–15.0)
MCH: 26.5 pg (ref 26.0–34.0)
MCHC: 31.5 g/dL (ref 30.0–36.0)
MCV: 84.2 fL (ref 78.0–100.0)
Platelets: 232 10*3/uL (ref 150–400)
RBC: 5.05 MIL/uL (ref 3.87–5.11)
RDW: 26.4 % — ABNORMAL HIGH (ref 11.5–15.5)
WBC: 7.5 10*3/uL (ref 4.0–10.5)

## 2010-10-16 LAB — RETICULOCYTES
RBC.: 5.05 MIL/uL (ref 3.87–5.11)
Retic Count, Absolute: 70.7 10*3/uL (ref 19.0–186.0)
Retic Ct Pct: 1.4 % (ref 0.4–3.1)

## 2010-10-16 LAB — IRON AND TIBC
Iron: 58 ug/dL (ref 42–135)
Saturation Ratios: 21 % (ref 20–55)
TIBC: 278 ug/dL (ref 250–470)
UIBC: 220 ug/dL

## 2010-10-16 LAB — DIFFERENTIAL
Basophils Absolute: 0.1 10*3/uL (ref 0.0–0.1)
Basophils Relative: 1 % (ref 0–1)
Eosinophils Absolute: 0.3 10*3/uL (ref 0.0–0.7)
Eosinophils Relative: 4 % (ref 0–5)
Lymphocytes Relative: 20 % (ref 12–46)
Lymphs Abs: 1.5 10*3/uL (ref 0.7–4.0)
Monocytes Absolute: 0.7 10*3/uL (ref 0.1–1.0)
Monocytes Relative: 10 % (ref 3–12)
Neutro Abs: 5 10*3/uL (ref 1.7–7.7)
Neutrophils Relative %: 66 % (ref 43–77)

## 2010-10-16 LAB — FERRITIN: Ferritin: 136 ng/mL (ref 10–291)

## 2010-10-16 NOTE — Patient Instructions (Addendum)
[facility]  Discharge Instructions  RECOMMENDATIONS MADE BY THE CONSULTANT AND ANY TEST RESULTS WILL BE SENT TO YOUR REFERRING DOCTOR.   EXAM FINDINGS BY MD TODAY AND SIGNS AND SYMPTOMS TO REPORT TO CLINIC OR PRIMARY MD: Doing well hemoglobin is coming up.  Complete current bottle of folic acid then stop it.  Continue the ferrous sulfate.     SPECIAL INSTRUCTIONS/FOLLOW-UP: Lab work Needed on 9/17 at 9:40am and you will see the PA on 12/17/10 at 9:30.   I acknowledge that I have been informed and understand all the instructions given to me and received a copy. I do not have any more questions at this time, but understand that I may call the [facility] at [facility] at [phone] during business hours should I have any further questions or need assistance in obtaining follow-up care.    __________________________________________  _____________  __________ Signature of Patient or Authorized Representative            Date                   Time    __________________________________________ Nurse's Signature

## 2010-10-16 NOTE — Progress Notes (Signed)
Labs drawn today for cbc/diff,ferr,ret,FeIbc
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
## 2010-10-17 ENCOUNTER — Ambulatory Visit (HOSPITAL_COMMUNITY): Payer: Medicare PPO | Admitting: Oncology

## 2010-10-21 ENCOUNTER — Encounter: Payer: Self-pay | Admitting: Gastroenterology

## 2010-10-21 ENCOUNTER — Ambulatory Visit (INDEPENDENT_AMBULATORY_CARE_PROVIDER_SITE_OTHER): Payer: Medicare PPO | Admitting: Gastroenterology

## 2010-10-21 VITALS — BP 170/110 | HR 85 | Temp 97.2°F | Ht 63.0 in | Wt 216.4 lb

## 2010-10-21 DIAGNOSIS — B372 Candidiasis of skin and nail: Secondary | ICD-10-CM | POA: Insufficient documentation

## 2010-10-21 DIAGNOSIS — D509 Iron deficiency anemia, unspecified: Secondary | ICD-10-CM

## 2010-10-21 DIAGNOSIS — I10 Essential (primary) hypertension: Secondary | ICD-10-CM

## 2010-10-21 MED ORDER — NYSTATIN 100000 UNIT/GM EX POWD: Freq: Three times a day (TID) | CUTANEOUS | Status: DC

## 2010-10-21 NOTE — Progress Notes (Signed)
Primary Care Physician: Lanette Hampshire, MD  Primary Gastroenterologist:  Garfield Cornea, MD  Hematologist:  Everardo All, MD  Chief Complaint  Patient presents with  . Follow-up    Anemia    HPI: [name] is a 55 y.o. female here for followup of iron deficiency anemia. She was seen in June 2012 when she presented to the hospital with profound iron deficiency anemia. Colonoscopy with ileoscopy and EGD failed to reveal source for her anemia. Biopsies were negative for celiac disease or H. pylori. She had a tubalar adenoma removed from her sigmoid colon and will have surveillance colonoscopy in June 2017. She was Hemoccult negative in the hospital and returned 3 negative Hemoccults last month. Without evidence of occult GI bleeding, small bowel capsule endoscopy was not indicated. She has been followed by hematology and receiving iron infusions.  She denies any melena, rectal bleeding, constipation, diarrhea, abdominal pain, anorexia, vomiting, heartburn, dysphagia, unintentional weight loss. She complains of a rash underneath her breast and at her prior cesarean scar. She was noted to have elevated blood pressures during our visit as well as recently at her hematology visit. She had multiple elevated blood pressures during her hospital stay as well. Denies headaches, dizziness, numbness.  Current Outpatient Prescriptions  Medication Sig Dispense Refill  . Acetaminophen (TYLENOL PO) Take 2 tablets by mouth as needed.        . enalapril (VASOTEC) 10 MG tablet Take 10 mg by mouth daily.        . ferrous sulfate 325 (65 FE) MG tablet Take 325 mg by mouth daily.        . folic acid (FOLVITE) 1 MG tablet Take 1 mg by mouth daily.        Marland Kitchen nystatin (MYCOSTATIN) powder Apply topically 3 (three) times daily. Apply to affected areas TID until 3 days after rash gone. Keep area dry.  15 g  1    Allergies as of 10/21/2010  . (No Known Allergies)    ROS:  General: Negative for anorexia,  weight loss, fever, chills, fatigue, weakness. ENT: Negative for hoarseness, difficulty swallowing , nasal congestion. CV: Negative for chest pain, angina, palpitations, dyspnea on exertion, peripheral edema.  Respiratory: Negative for dyspnea at rest, dyspnea on exertion, cough, sputum, wheezing.  GI: See history of present illness. GU:  Negative for dysuria, hematuria, urinary incontinence, urinary frequency, nocturnal urination.  Endo: Negative for unusual weight change.    Physical Examination:   BP 170/110  Pulse 85  Temp(Src) 97.2 F (36.2 C) (Temporal)  Ht 5\' 3"  (1.6 m)  Wt 216 lb 6.4 oz (98.158 kg)  BMI 38.33 kg/m2  General: Well-nourished, well-developed in no acute distress.  Eyes: No icterus. Mouth: Oropharyngeal mucosa moist and pink , no lesions erythema or exudate. Lungs: Clear to auscultation bilaterally.  Heart: Regular rate and rhythm, no murmurs rubs or gallops.  Abdomen: Bowel sounds are normal, nontender, nondistended, no hepatosplenomegaly or masses, no abdominal bruits or hernia , no rebound or guarding.   Extremities: No lower extremity edema.  No clubbing or deformities. Signs of significant nail biting.  Neuro: Alert and oriented x 4   Skin: Warm and dry, no jaundice.  She has an erythematous, macular rash at her prior cesarean site and underneath the breast consistent with cutaneous candidiasis Psych: Alert and cooperative, normal mood and affect.  Labs:  Recent Results (from the past 336 hour(s))  CBC   Collection Time   10/16/10  9:45 AM  Component Value Range   WBC 7.5  4.0 - 10.5 (K/uL)   RBC 5.05  3.87 - 5.11 (MIL/uL)   Hemoglobin 13.4  12.0 - 15.0 (g/dL)   HCT 42.5  36.0 - 46.0 (%)   MCV 84.2  78.0 - 100.0 (fL)   MCH 26.5  26.0 - 34.0 (pg)   MCHC 31.5  30.0 - 36.0 (g/dL)   RDW 26.4 (*) 11.5 - 15.5 (%)   Platelets 232  150 - 400 (K/uL)  DIFFERENTIAL   Collection Time   10/16/10  9:45 AM      Component Value Range   Neutrophils Relative  66  43 - 77 (%)   Neutro Abs 5.0  1.7 - 7.7 (K/uL)   Lymphocytes Relative 20  12 - 46 (%)   Lymphs Abs 1.5  0.7 - 4.0 (K/uL)   Monocytes Relative 10  3 - 12 (%)   Monocytes Absolute 0.7  0.1 - 1.0 (K/uL)   Eosinophils Relative 4  0 - 5 (%)   Eosinophils Absolute 0.3  0.0 - 0.7 (K/uL)   Basophils Relative 1  0 - 1 (%)   Basophils Absolute 0.1  0.0 - 0.1 (K/uL)  IRON AND TIBC   Collection Time   10/16/10  9:45 AM      Component Value Range   Iron 58  42 - 135 (ug/dL)   TIBC 278  250 - 470 (ug/dL)   Saturation Ratios 21  20 - 55 (%)   UIBC 220    FERRITIN   Collection Time   10/16/10  9:45 AM      Component Value Range   Ferritin 136  10 - 291 (ng/mL)  RETICULOCYTES   Collection Time   10/16/10  9:45 AM      Component Value Range   Retic Ct Pct 1.4  0.4 - 3.1 (%)   RBC. 5.05  3.87 - 5.11 (MIL/uL)   Retic Count, Manual 70.7  19.0 - 186.0 (K/uL)

## 2010-10-21 NOTE — Assessment & Plan Note (Addendum)
Consistently elevated blood pressures noted during hospitalization, at our office visit and at the hematology clinic (177/110). Today her blood pressure ranged from 170/110-160/100 depending on arm. I have advised that she needs to followup with her primary care physician this week regarding her blood pressure. She states she will make the appointment. Warned her of risk of elevated blood pressures including stroke and heart disease.

## 2010-10-21 NOTE — Assessment & Plan Note (Signed)
Iron deficiency anemia without evidence of occult GI bleeding. Continue management by hematology. We will recheck three stool Hemoccults and if negative no further workup by GI. If she has a positive Hemoccult, then would consider small bowel capsule endoscopy.  Patient had multiple questions regarding dietary iron. We discussed various food options and written sheet provided. She states she is not a good reader but would have a family member available to assist her.

## 2010-10-21 NOTE — Patient Instructions (Signed)
Iron Rich Diet An iron rich diet contains foods that are good sources of iron. Iron is an important mineral. It is used to make hemoglobin. Hemoglobin is a protein needed for red blood cells so that oxygen can be carried through the body. The iron level in the blood can be low. Reasons for low iron in the blood include:  Lack of iron in the diet.   Blood loss.   During times of growth such as the growth and development of children or pregnancy.  Low levels of iron can cause a decrease in the number of red blood cells. This can result in iron deficiency anemia. Iron deficiency anemia symptoms include:  Lack of energy.   Increased chance of infection.   Other health problems.  Males older than 55 years of age need 8mg  of iron per day. Women ages 62-50 need 18mg  per day. Pregnant women need 27 mg per day, and women who are over 74 years of age and breastfeeding need 9mg  per day. Women over the age of 78 need 8mg  of iron per day. SOURCES OF IRON: There are two types of iron that are found in food; heme and non-heme iron. Heme iron is absorbed by the body better than non-heme iron. Heme iron is found in meat, poultry and fish. Non-heme iron is found in grains, beans, and vegetables. Heme iron sources:  Food Amount of iron in milligrams (mg)  3 oz. chicken liver.  10  3 oz. beef liver. 5.5  3 oz. oysters. 8  3 oz. beef. 2-3  3 oz. shrimp. 2.8  3 oz. [place]. 2  3 oz. chicken. 1  3 oz. fish (tuna, halibut). 1  3 oz. pork. 0.9  Non-heme iron sources: Food Amount of iron in milligrams (mg)  Ready-to-eat breakfast cereal, iron fortified. 3.9-7   cup tofu. 3.4   cup kidney beans. 2.6  Baked potato with skin. 2.7   cup asparagus. 2.2  Avocado. 2   cup dried peaches. 1.6   cup raisins. 1.5   1 cup soy milk. 1.5  1 slice whole wheat bread. 1.2  1 cup spinach. 0.8   cup broccoli. 0.6  CERTAIN FOODS INCREASE AND DECREASE IRON ABSORPTION: Foods that can DECREASE the body's absorption  of iron include:  Coffee.  Tea.   Fiber.  Soy.   Try to avoid these foods and beverages while eating meals with iron containing foods. Foods containing vitamin C INCREASE the body's absorption of iron. Foods that are high in vitamin C include many fruits and vegetables. Some good sources are:  Fresh orange juice.  Oranges.   Strawberries.   Mangos.   Grapefruit.   Red bell peppers.  Green bell peppers.   Broccoli.   Potatoes with skin.   Tomato juice.   Foods containing vitamin C can help increase the amount of iron your body absorbs from iron sources, especially from non-heme sources. Eat foods with vitamin C along with iron containing foods to increase your iron absorption. Document Released: 10/28/2004 Document Re-Released: 04/05/2007 [facility] Patient Information 2011 [place].

## 2010-10-21 NOTE — Assessment & Plan Note (Signed)
Cutaneous candidiasis underneath the breast and panniculus. Prescription for nystatin powder provided. Advised to keep the area dry and clean as much possible.

## 2010-10-22 NOTE — Progress Notes (Signed)
Cc to PCP 

## 2010-11-03 ENCOUNTER — Ambulatory Visit: Payer: Medicare PPO | Admitting: Internal Medicine

## 2010-11-03 DIAGNOSIS — D649 Anemia, unspecified: Secondary | ICD-10-CM

## 2010-11-06 NOTE — Progress Notes (Signed)
Pt returned 3 hemoccult cards and all 3 were negative.

## 2010-11-13 NOTE — Progress Notes (Signed)
Please let patient know her stool is heme neg X 3 again! Continue to follow with Dr. [name] for IDA. No further w/u needed here.

## 2010-12-15 ENCOUNTER — Encounter (HOSPITAL_COMMUNITY): Payer: Medicare PPO | Attending: Oncology

## 2010-12-15 DIAGNOSIS — D509 Iron deficiency anemia, unspecified: Secondary | ICD-10-CM | POA: Insufficient documentation

## 2010-12-15 LAB — CBC
HCT: 42.8 % (ref 36.0–46.0)
Hemoglobin: 14.1 g/dL (ref 12.0–15.0)
MCH: 30.5 pg (ref 26.0–34.0)
MCHC: 32.9 g/dL (ref 30.0–36.0)
MCV: 92.6 fL (ref 78.0–100.0)
Platelets: 212 10*3/uL (ref 150–400)
RBC: 4.62 MIL/uL (ref 3.87–5.11)
RDW: 14.5 % (ref 11.5–15.5)
WBC: 8.2 10*3/uL (ref 4.0–10.5)

## 2010-12-15 LAB — COMPREHENSIVE METABOLIC PANEL
ALT: 21 U/L (ref 0–35)
AST: 19 U/L (ref 0–37)
Albumin: 3.6 g/dL (ref 3.5–5.2)
Alkaline Phosphatase: 103 U/L (ref 39–117)
BUN: 15 mg/dL (ref 6–23)
CO2: 25 mEq/L (ref 19–32)
Calcium: 8.9 mg/dL (ref 8.4–10.5)
Chloride: 104 mEq/L (ref 96–112)
Creatinine, Ser: 0.58 mg/dL (ref 0.50–1.10)
GFR calc Af Amer: >60 mL/min (ref >60)
GFR calc non Af Amer: >60 mL/min (ref >60)
Glucose, Bld: 152 mg/dL — ABNORMAL HIGH (ref 70–99)
Potassium: 4 mEq/L (ref 3.5–5.1)
Sodium: 138 mEq/L (ref 135–145)
Total Bilirubin: 0.3 mg/dL (ref 0.3–1.2)
Total Protein: 7.4 g/dL (ref 6.0–8.3)

## 2010-12-15 LAB — RETICULOCYTES
RBC.: 4.62 MIL/uL (ref 3.87–5.11)
Retic Count, Absolute: 60.1 10*3/uL (ref 19.0–186.0)
Retic Ct Pct: 1.3 % (ref 0.4–3.1)

## 2010-12-15 LAB — IRON AND TIBC
Iron: 58 ug/dL (ref 42–135)
Saturation Ratios: 21 % (ref 20–55)
TIBC: 278 ug/dL (ref 250–470)
UIBC: 220 ug/dL (ref 125–400)

## 2010-12-15 LAB — VITAMIN B12: Vitamin B-12: 307 pg/mL (ref 211–911)

## 2010-12-15 LAB — FOLATE: Folate: 10.7 ng/mL

## 2010-12-15 LAB — FERRITIN: Ferritin: 73 ng/mL (ref 10–291)

## 2010-12-15 NOTE — Progress Notes (Signed)
Labs drawn today for epo,cmp,retic,ferr,cbc,ferr,feibc,vb12,folate

## 2010-12-16 ENCOUNTER — Encounter (HOSPITAL_BASED_OUTPATIENT_CLINIC_OR_DEPARTMENT_OTHER): Payer: Medicare PPO | Admitting: Oncology

## 2010-12-16 DIAGNOSIS — D509 Iron deficiency anemia, unspecified: Secondary | ICD-10-CM

## 2010-12-16 DIAGNOSIS — L539 Erythematous condition, unspecified: Secondary | ICD-10-CM

## 2010-12-16 LAB — ERYTHROPOIETIN: Erythropoietin: 28.2 m[IU]/mL (ref 2.6–34.0)

## 2010-12-16 NOTE — Progress Notes (Signed)
Anne Hampshire, MD [place] [address]  1. Iron deficiency anemia  CBC, Vitamin B12, Folate, Iron and TIBC, Ferritin    CURRENT THERAPY:S/P Feraheme 1020 mg on 09/12/2010 while an inpatient at the [facility]: [name] 55 y.o. female returns for  regular  visit for followup of iron deficiency anemia.  She had a work-up by GI, Dr. [name], which was negative.    The patient denies any complaints.  She reports that her mother and father are doing well.  He mentally handicapped son is doing well too.    The patient denies any epistaxis, gingival bleeding, vaginal bleeding, rectal bleeding, blood in stool, black tarry stool, and hematuria.  She denies any ice cravings and pica.   The patient does have a rash at the superior portion of her sternum.  She tells me that it is from "tape" that was applied to her skin while at another physicians office.  She is unable to elaborate much more than that, but I wonder if it is from an EKG although if this is true, this spot reveals that there may have been poor placement of the leads.  She denies any pain associated with the rash.  She reports that it occasionally is pruritic.  She explains that it is improving.  Past Medical History  Diagnosis Date  . Hypertension   . Iron deficiency anemia 10/16/2010  . Mental handicap 10/16/2010    has Iron deficiency anemia; Mental handicap; Cutaneous candidiasis; and Hypertension on her problem list.      has no known allergies.  [name] does not currently have medications on file.  Past Surgical History  Procedure Date  . Cholecystectomy   . Abdominal hysterectomy   . Knee surgery     right knee @ 55 years of age  . Cesarean section   . Colonoscopy 08/2010    normal TI, sigmoid polyp (adenoma ). Next TCS due  08/2015,  . Esophagogastroduodenoscopy 08/2010    antral and duodenal erosions s/p bx (chronic gastritis, no h.pylori, no celiac dz ), hiatal  hernia    Denies any headaches, dizziness, double vision, fevers, chills, night sweats, nausea, vomiting, diarrhea, constipation, chest pain, heart palpitations, shortness of breath, blood in stool, black tarry stool, urinary pain, urinary burning, urinary frequency, hematuria.   PHYSICAL EXAMINATION  ECOG PERFORMANCE STATUS: 0 - Asymptomatic  There were no vitals filed for this visit.  GENERAL:alert, no distress, well nourished, well developed, comfortable, cooperative, obese, smiling and slow mentation SKIN: skin color, texture, turgor are normal, Atopic dermatitis noted at the superior portion of the chest.  It is erythematous and is pruritic occasionally.  It is a rash patch measuring approximately 8 x 4 cm in size. HEAD: Normocephalic EYES: normal EARS: External ears normal OROPHARYNX:mucous membranes are moist  NECK: trachea midline LYMPH:  No epitrochlear nodes BREAST:not examined LUNGS: clear to auscultation  HEART: regular rate & rhythm, no murmurs, no gallops, S1 normal and S2 normal ABDOMEN:abdomen soft, non-tender, obese and normal bowel sounds BACK: Back symmetric, no curvature. EXTREMITIES:less then 2 second capillary refill, no joint deformities, effusion, or inflammation, no skin discoloration, no clubbing, no cyanosis  NEURO: alert & oriented x 3 with fluent speech, slow mentation   LABORATORY DATA: CBC    Component Value Date/Time   WBC 8.2 12/15/2010 0919   RBC 4.62 12/15/2010 0919   HGB 14.1 12/15/2010 0919   HCT 42.8 12/15/2010 0919  PLT 212 12/15/2010 0919   MCV 92.6 12/15/2010 0919   MCH 30.5 12/15/2010 0919   MCHC 32.9 12/15/2010 0919   RDW 14.5 12/15/2010 0919   LYMPHSABS 1.5 10/16/2010 0945   MONOABS 0.7 10/16/2010 0945   EOSABS 0.3 10/16/2010 0945   BASOSABS 0.1 10/16/2010 0945    \   Chemistry      Component Value Date/Time   NA 138 12/15/2010 0919   K 4.0 12/15/2010 0919   CL 104 12/15/2010 0919   CO2 25 12/15/2010 0919   BUN 15 12/15/2010 0919    CREATININE 0.58 12/15/2010 0919      Component Value Date/Time   CALCIUM 8.9 12/15/2010 0919   ALKPHOS 103 12/15/2010 0919   AST 19 12/15/2010 0919   ALT 21 12/15/2010 0919   BILITOT 0.3 12/15/2010 0919     Lab Results  Component Value Date   IRON 58 12/15/2010   TIBC 278 12/15/2010   FERRITIN 73 12/15/2010    ASSESSMENT:  1. Iron deficiency anemia, unclear etiology.  Negative GI work-up. 2. Mentally handicapped 3. Erythematous, raised patch that is occasionally pruritic at the superior portion of sternum   PLAN:  1. Lab work in 2 months: CBC, Anemia panel 2. Return in 2 months for follow-up. 3. Patient will call if rash does not improve/resolve 4. I personally reviewed and went over laboratory results with the patient. 5. Will order IV Feraheme PRN pending next lab work    All questions were answered. The patient knows to call the clinic with any problems, questions or concerns. We can certainly see the patient much sooner if necessary.  The patient and plan discussed with Everardo All, MD and he is in agreement with the aforementioned.   [name]

## 2010-12-17 ENCOUNTER — Ambulatory Visit (HOSPITAL_COMMUNITY): Payer: Medicare PPO | Admitting: Oncology

## 2011-02-16 ENCOUNTER — Encounter (HOSPITAL_COMMUNITY): Payer: Medicare PPO | Attending: Oncology

## 2011-02-16 DIAGNOSIS — D509 Iron deficiency anemia, unspecified: Secondary | ICD-10-CM

## 2011-02-16 LAB — CBC
HCT: 42 % (ref 36.0–46.0)
Hemoglobin: 13.6 g/dL (ref 12.0–15.0)
MCH: 31 pg (ref 26.0–34.0)
MCHC: 32.4 g/dL (ref 30.0–36.0)
MCV: 95.7 fL (ref 78.0–100.0)
Platelets: 231 10*3/uL (ref 150–400)
RBC: 4.39 MIL/uL (ref 3.87–5.11)
RDW: 13.1 % (ref 11.5–15.5)
WBC: 9.8 10*3/uL (ref 4.0–10.5)

## 2011-02-16 LAB — IRON AND TIBC
Iron: 32 ug/dL — ABNORMAL LOW (ref 42–135)
Saturation Ratios: 11 % — ABNORMAL LOW (ref 20–55)
TIBC: 299 ug/dL (ref 250–470)
UIBC: 267 ug/dL (ref 125–400)

## 2011-02-16 LAB — FERRITIN: Ferritin: 82 ng/mL (ref 10–291)

## 2011-02-16 LAB — FOLATE: Folate: 12.6 ng/mL

## 2011-02-16 LAB — VITAMIN B12: Vitamin B-12: 335 pg/mL (ref 211–911)

## 2011-02-16 NOTE — Progress Notes (Signed)
Labs drawn today for cbc,vb12,folate,Iron and IBC,Ferr

## 2011-02-24 ENCOUNTER — Encounter (HOSPITAL_COMMUNITY): Payer: Self-pay | Admitting: Oncology

## 2011-02-24 ENCOUNTER — Encounter (HOSPITAL_BASED_OUTPATIENT_CLINIC_OR_DEPARTMENT_OTHER): Payer: Medicare PPO | Admitting: Oncology

## 2011-02-24 DIAGNOSIS — D509 Iron deficiency anemia, unspecified: Secondary | ICD-10-CM

## 2011-02-24 DIAGNOSIS — B372 Candidiasis of skin and nail: Secondary | ICD-10-CM

## 2011-02-24 DIAGNOSIS — F79 Unspecified intellectual disabilities: Secondary | ICD-10-CM

## 2011-02-24 NOTE — Patient Instructions (Signed)
[facility]  Discharge Instructions  Anne MULLADY  DO:[id number] 03-07-1956   RECOMMENDATIONS MADE BY THE CONSULTANT AND ANY TEST RESULTS WILL BE SENT TO YOUR REFERRING DOCTOR.   EXAM FINDINGS BY MD TODAY AND SIGNS AND SYMPTOMS TO REPORT TO CLINIC OR PRIMARY MD:  Exam per Kirby Crigler PA  INSTRUCTIONS GIVEN AND DISCUSSED: Other : ok to get labs across street at [place]. Do this in late March 2013  SPECIAL INSTRUCTIONS/FOLLOW-UP: Return to Clinic on : early april   I acknowledge that I have been informed and understand all the instructions given to me and received a copy. I do not have any more questions at this time, but understand that I may call the Specialty Clinic at [facility] at [phone] during business hours should I have any further questions or need assistance in obtaining follow-up care.    __________________________________________  _____________  __________ Signature of Patient or Authorized Representative            Date                   Time    __________________________________________ Nurse's Signature

## 2011-02-24 NOTE — Progress Notes (Signed)
Anne Hampshire, MD [place] [address]  1. Iron deficiency anemia   2. Mental handicap   3. Cutaneous candidiasis     CURRENT THERAPY: PRN feraheme infusions  INTERVAL HISTORY: [name] 55 y.o. female returns for  regular  visit for followup of iron deficiency anemia.  Patient denies any complaints. Ms. Gales is pleasant as usual. 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 She and her son perform household activities in order care for her elderly parents. She reports that her son does most to work because he injects her mothers abdomen with her medication.  The patient reports that she had a nice Thanksgiving holiday. She denies any problems.  She is pleased that her energy level is sufficient. The specimen time going over her laboratory work. I personally reviewed and went over laboratory results with the patient. Her hemoglobin is quite steady along with her ferritin level. Actually her ferritin level has increased slightly with the aid of oral iron. I've asked her to continue with her PO ferrous sulfate along with her folic acid in order to maintain her levels.  Explained to the patient that we will continue monitoring her with regular laboratory work. I've proposed that we perform laboratory work in 2 months time and again in 4 months time. She'll then return in approximately 4 months time to go over laboratory work. We will intervene with any interventions if necessary according to her laboratory results.  The patient has requested to get her laboratory work performed "across [employer]."  As result, I wrote a prescription for her to have her complete blood count and a ferritin level performed at the end of January 2013 and the end of March 2013. She'll then return at the beginning of April 2013 over laboratory work. As mentioned above, we will intervene if necessary according to her laboratory results.   The patient continues to have cutaneous  candidiasis of her fingers secondary to her biting her fingernails. She reports that this is been a bad habit of her since she was a little girl. It is likely secondary to anxiety.  Towards the end of our visit, the patient does report some intermittent abdominal pain that occurs after she lays on her side for a period of time. It does subside, and she denies his pain currently. She denies any increase of the pain during that palpation. The pain is not reproducible at this point time. She reiterates the fact that it comes and goes and is not presently bothering her.  Past Medical History  Diagnosis Date  . Hypertension   . Iron deficiency anemia 10/16/2010  . Mental handicap 10/16/2010    has Iron deficiency anemia; Mental handicap; Cutaneous candidiasis; and Hypertension on her problem list.      has no known allergies.  Ms. Willhoit does not currently have medications on file.  Past Surgical History  Procedure Date  . Cholecystectomy   . Abdominal hysterectomy   . Knee surgery     right knee @ 55 years of age  . Cesarean section   . Colonoscopy 08/2010    normal TI, sigmoid polyp (adenoma ). Next TCS due  08/2015,  . Esophagogastroduodenoscopy 08/2010    antral and duodenal erosions s/p bx (chronic gastritis, no h.pylori, no celiac dz ), hiatal hernia    Denies any headaches, dizziness, double vision, fevers, chills, night sweats, nausea, vomiting, diarrhea, constipation, chest pain, heart palpitations, shortness of breath, blood in stool, black  tarry stool, urinary pain, urinary burning, urinary frequency, hematuria.   PHYSICAL EXAMINATION  ECOG PERFORMANCE STATUS: 0 - Asymptomatic  Filed Vitals:   02/24/11 1000  BP: 190/124  Pulse: 83  Temp: 97.7 F (36.5 C)    GENERAL:alert, no distress, well nourished, well developed, comfortable, cooperative, obese, smiling and mentally handicapped SKIN: skin color, texture, turgor are normal HEAD: Normocephalic EYES: normal EARS:  External ears normal OROPHARYNX:mucous membranes are moist  NECK: supple, trachea midline LYMPH:  no palpable lymphadenopathy BREAST:not examined LUNGS: clear to auscultation  HEART: regular rate & rhythm, no murmurs, no gallops, S1 normal and S2 normal ABDOMEN:abdomen soft, non-tender, obese and normal bowel sounds BACK: Back symmetric, no curvature. EXTREMITIES:less then 2 second capillary refill, no joint deformities, effusion, or inflammation, no edema, no clubbing, no cyanosis, positive findings:  Cutaneous candidiasis of finger nails secondary to finger no biting habit. A number fingernails are distorted.  NEURO: alert & oriented x 3 with fluent speech, no focal motor/sensory deficits, gait normal, slow mentation secondary to mental handicap.  LABORATORY DATA: CBC    Component Value Date/Time   WBC 9.8 02/16/2011 1026   RBC 4.39 02/16/2011 1026   HGB 13.6 02/16/2011 1026   HCT 42.0 02/16/2011 1026   PLT 231 02/16/2011 1026   MCV 95.7 02/16/2011 1026   MCH 31.0 02/16/2011 1026   MCHC 32.4 02/16/2011 1026   RDW 13.1 02/16/2011 1026   LYMPHSABS 1.5 10/16/2010 0945   MONOABS 0.7 10/16/2010 0945   EOSABS 0.3 10/16/2010 0945   BASOSABS 0.1 10/16/2010 0945    Lab Results  Component Value Date   IRON 32* 02/16/2011   TIBC 299 02/16/2011   FERRITIN 82 02/16/2011      ASSESSMENT:  1. Iron deficiency anemia, unclear etiology. Negative GI work-up.  2. Mentally handicapped  3. Intermittent abdominal pain that resolves on its own, unknown etiology at this time.   PLAN:  1. Continue oral ferrous sulfate and folic acid 2. Lab work in 2 months and 4 months: CBC, ferritin 3. Lab work will be performed across the street from the clinic at [facility] 4. I personally reviewed and went over laboratory results with the patient. 5. Return at the beginning of April 2013 for follow-up.   All questions were answered. The patient knows to call the clinic with any problems, questions or  concerns. We can certainly see the patient much sooner if necessary.  The patient and plan discussed with Everardo All, MD and he is in agreement with the aforementioned.   Anderson Middlebrooks

## 2011-03-31 DIAGNOSIS — I2510 Atherosclerotic heart disease of native coronary artery without angina pectoris: Secondary | ICD-10-CM

## 2011-03-31 HISTORY — DX: Atherosclerotic heart disease of native coronary artery without angina pectoris: I25.10

## 2011-06-30 ENCOUNTER — Ambulatory Visit (HOSPITAL_COMMUNITY): Payer: Medicare PPO | Admitting: Oncology

## 2011-07-02 ENCOUNTER — Encounter (HOSPITAL_COMMUNITY): Payer: Self-pay | Admitting: Oncology

## 2011-07-02 ENCOUNTER — Encounter (HOSPITAL_COMMUNITY): Payer: Medicare PPO | Attending: Oncology | Admitting: Oncology

## 2011-07-02 VITALS — BP 186/115 | HR 72 | Temp 98.2°F | Wt 251.7 lb

## 2011-07-02 DIAGNOSIS — D509 Iron deficiency anemia, unspecified: Secondary | ICD-10-CM

## 2011-07-02 DIAGNOSIS — F79 Unspecified intellectual disabilities: Secondary | ICD-10-CM

## 2011-07-02 NOTE — Patient Instructions (Signed)
[name]  WE:[id number] Nov 27, 1955   [facility]  Discharge Instructions  RECOMMENDATIONS MADE BY THE CONSULTANT AND ANY TEST RESULTS WILL BE SENT TO YOUR REFERRING DOCTOR.   EXAM FINDINGS BY MD TODAY AND SIGNS AND SYMPTOMS TO REPORT TO CLINIC OR PRIMARY MD: Exam findings as discussed by T. Kefalas, PA-C.  SPECIAL INSTRUCTIONS/FOLLOW-UP: 1.  Continue taking your Ferrous Sulfate. 2.  Follow up with Dr. Everette Rank regarding your elevated blood pressure. 3.  Return in 3 months and again in 6 months to have your CBC and ferritin repeated. 4.  You will see T. Kefalas, PA-C again in 6 months as well.   I acknowledge that I have been informed and understand all the instructions given to me and received a copy. I do not have any more questions at this time, but understand that I may call the Specialty Clinic at [facility] at [phone] during business hours should I have any further questions or need assistance in obtaining follow-up care.    __________________________________________  _____________  __________ Signature of Patient or Authorized Representative            Date                   Time    __________________________________________ Nurse's Signature

## 2011-07-02 NOTE — Progress Notes (Signed)
Anne Hampshire, MD, MD [place] [address]  1. Iron deficiency anemia  CBC, Ferritin, CBC, Ferritin    CURRENT THERAPY:Ferrous Sulfate PO  INTERVAL HISTORY: [name] 56 y.o. female returns for  regular  visit for followup of iron deficiency anemia.  [name] is doing very well. She is tolerating her oral ferrous sulfate well without any nausea, vomiting, upset stomach, or constipation.  I personally reviewed and went over laboratory results with the patient.  Her hemoglobin is within normal limits and her ferritin most recently on 06/29/2011 was 92. Her hemoglobin on 06/29/2011 was 14.1. White blood cell count 7.6, platelet count 240, and all other parameters are within normal limits.  We spoke briefly about the patient's elevated blood pressure. She reports that her blood pressure does increase when she gets excited. She explains that she does not feel excited presently, but she reports, "I must be excited because I am biting my fingernails." She is taking an antihypertensive medication, namely enalapril 10 mg. I will defer the patient's hypertension her primary care doctor, Dr. Everette Rank.  Otherwise, the patient denies any complaints.  ROS: No TIA's or unusual headaches, no dysphagia.  No prolonged cough. No dyspnea or chest pain on exertion.  No abdominal pain, change in bowel habits, black or bloody stools.  No urinary tract symptoms.  No new or unusual musculoskeletal symptoms.  Normal menses, no abnormal vaginal bleeding, discharge or unexpected pelvic pain. No new breast lumps, breast pain or nipple discharge.  She did undergo a colonoscopy at time of diagnosis of her iron deficiency anemia. She reports that she usually gets a mammogram performed every year. Her last mammogram was in 2010. She is overdue for her next mammogram. She reports that she will try and the one done before she returns for a followup appointment.  Past Medical History  Diagnosis Date  .  Hypertension   . Iron deficiency anemia 10/16/2010  . Mental handicap 10/16/2010    has Iron deficiency anemia; Mental handicap; Cutaneous candidiasis; and Hypertension on her problem list.      has no known allergies.  Ms. Pettway had no medications administered during this visit.  Past Surgical History  Procedure Date  . Cholecystectomy   . Abdominal hysterectomy   . Knee surgery     right knee @ 56 years of age  . Cesarean section   . Colonoscopy 08/2010    normal TI, sigmoid polyp (adenoma ). Next TCS due  08/2015,  . Esophagogastroduodenoscopy 08/2010    antral and duodenal erosions s/p bx (chronic gastritis, no h.pylori, no celiac dz ), hiatal hernia    Denies any headaches, dizziness, double vision, fevers, chills, night sweats, nausea, vomiting, diarrhea, constipation, chest pain, heart palpitations, shortness of breath, blood in stool, black tarry stool, urinary pain, urinary burning, urinary frequency, hematuria.   PHYSICAL EXAMINATION  ECOG PERFORMANCE STATUS: 0 - Asymptomatic  Filed Vitals:   07/02/11 1058  BP: 186/115  Pulse: 72  Temp: 98.2 F (36.8 C)    GENERAL:alert, no distress, well nourished, well developed, comfortable, cooperative, obese and mentally handicapped SKIN: skin color, texture, turgor are normal, no rashes or significant lesions HEAD: Normocephalic, No masses, lesions, tenderness or abnormalities EYES: normal, EOMI, Conjunctiva are pink and non-injected EARS: External ears normal OROPHARYNX:lips, buccal mucosa, and tongue normal and mucous membranes are moist  NECK: supple, trachea midline LYMPH:  no palpable lymphadenopathy BREAST:not examined LUNGS: clear to auscultation and percussion, poor respiratory effort HEART: regular  rate & rhythm, no murmurs, no gallops, S1 normal and S2 normal ABDOMEN:abdomen soft, non-tender, obese and normal bowel sounds BACK: Back symmetric, no curvature., No CVA tenderness EXTREMITIES:less then 2 second  capillary refill, no joint deformities, effusion, or inflammation, no edema, no skin discoloration, no clubbing, no cyanosis, positive findings:  positive findings: Cutaneous candidiasis of finger nails secondary to fingernail biting habit. A number of fingernails are distorted.   NEURO: alert & oriented x 3 with fluent speech, no focal motor/sensory deficits, gait normal, positive findings: Slow mentation secondary to mental handicap   LABORATORY DATA: Most recent laboratory work performed at [facility] reveals a white blood cell count of 7.6, hemoglobin 14.1, hematocrit 43.6, MCV 97.5, platelet count 240,000, ferritin 92     ASSESSMENT:  1. Iron deficiency anemia, unclear etiology. Negative GI work-up. Tolerating oral ferrous sulfate well. 2. Mentally handicapped    PLAN:  1. Continue once daily oral ferrous sulfate. 2. I personally reviewed and went over laboratory results with the patient. 3. Laboratory work in 3 months and 6 months time: CBC, ferritin 4. Encourage the patient to follow up with her primary care physician regarding her hypertension. Blood pressure today noted to be 186/115. 5. Return to clinic in 6 months time for followup.   All questions were answered. The patient knows to call the clinic with any problems, questions or concerns. We can certainly see the patient much sooner if necessary.   Shannara Winbush

## 2011-08-10 ENCOUNTER — Encounter: Payer: Self-pay | Admitting: Oncology

## 2011-09-19 ENCOUNTER — Emergency Department (HOSPITAL_COMMUNITY): Payer: Medicare HMO

## 2011-09-19 ENCOUNTER — Inpatient Hospital Stay (HOSPITAL_COMMUNITY)
Admission: EM | Admit: 2011-09-19 | Discharge: 2011-09-22 | DRG: 287 | Disposition: A | Discharge status: Home / Self Care | Payer: Medicare HMO | Attending: Cardiovascular Disease | Admitting: Cardiovascular Disease

## 2011-09-19 ENCOUNTER — Encounter (HOSPITAL_COMMUNITY): Payer: Self-pay

## 2011-09-19 DIAGNOSIS — J4489 Other specified chronic obstructive pulmonary disease: Secondary | ICD-10-CM | POA: Diagnosis present

## 2011-09-19 DIAGNOSIS — E669 Obesity, unspecified: Secondary | ICD-10-CM | POA: Diagnosis present

## 2011-09-19 DIAGNOSIS — E119 Type 2 diabetes mellitus without complications: Secondary | ICD-10-CM | POA: Diagnosis present

## 2011-09-19 DIAGNOSIS — Z833 Family history of diabetes mellitus: Secondary | ICD-10-CM

## 2011-09-19 DIAGNOSIS — I5021 Acute systolic (congestive) heart failure: Principal | ICD-10-CM | POA: Diagnosis present

## 2011-09-19 DIAGNOSIS — I161 Hypertensive emergency: Secondary | ICD-10-CM

## 2011-09-19 DIAGNOSIS — Z79899 Other long term (current) drug therapy: Secondary | ICD-10-CM

## 2011-09-19 DIAGNOSIS — J449 Chronic obstructive pulmonary disease, unspecified: Secondary | ICD-10-CM | POA: Diagnosis present

## 2011-09-19 DIAGNOSIS — I2789 Other specified pulmonary heart diseases: Secondary | ICD-10-CM | POA: Diagnosis present

## 2011-09-19 DIAGNOSIS — I509 Heart failure, unspecified: Secondary | ICD-10-CM

## 2011-09-19 DIAGNOSIS — I10 Essential (primary) hypertension: Secondary | ICD-10-CM | POA: Diagnosis present

## 2011-09-19 DIAGNOSIS — Z87891 Personal history of nicotine dependence: Secondary | ICD-10-CM

## 2011-09-19 DIAGNOSIS — Z6841 Body Mass Index (BMI) 40.0 and over, adult: Secondary | ICD-10-CM

## 2011-09-19 DIAGNOSIS — I2510 Atherosclerotic heart disease of native coronary artery without angina pectoris: Secondary | ICD-10-CM | POA: Diagnosis present

## 2011-09-19 LAB — BASIC METABOLIC PANEL
BUN: 15 mg/dL (ref 6–23)
CO2: 23 mEq/L (ref 19–32)
Calcium: 9.6 mg/dL (ref 8.4–10.5)
Chloride: 97 mEq/L (ref 96–112)
Creatinine, Ser: 0.7 mg/dL (ref 0.50–1.10)
GFR calc Af Amer: >90 mL/min (ref >90)
GFR calc non Af Amer: >90 mL/min (ref >90)
Glucose, Bld: 205 mg/dL — ABNORMAL HIGH (ref 70–99)
Potassium: 4.3 mEq/L (ref 3.5–5.1)
Sodium: 134 mEq/L — ABNORMAL LOW (ref 135–145)

## 2011-09-19 LAB — DIFFERENTIAL
Basophils Absolute: 0 10*3/uL (ref 0.0–0.1)
Basophils Relative: 0 % (ref 0–1)
Eosinophils Absolute: 0.1 10*3/uL (ref 0.0–0.7)
Eosinophils Relative: 1 % (ref 0–5)
Lymphocytes Relative: 20 % (ref 12–46)
Lymphs Abs: 2 10*3/uL (ref 0.7–4.0)
Monocytes Absolute: 0.6 10*3/uL (ref 0.1–1.0)
Monocytes Relative: 6 % (ref 3–12)
Neutro Abs: 7.7 10*3/uL (ref 1.7–7.7)
Neutrophils Relative %: 74 % (ref 43–77)

## 2011-09-19 LAB — CBC
HCT: 43.7 % (ref 36.0–46.0)
HCT: 44.1 % (ref 36.0–46.0)
Hemoglobin: 14.4 g/dL (ref 12.0–15.0)
Hemoglobin: 15.4 g/dL — ABNORMAL HIGH (ref 12.0–15.0)
MCH: 31 pg (ref 26.0–34.0)
MCH: 32 pg (ref 26.0–34.0)
MCHC: 33 g/dL (ref 30.0–36.0)
MCHC: 34.9 g/dL (ref 30.0–36.0)
MCV: 94.2 fL (ref 78.0–100.0)
MCV: 91.5 fL (ref 78.0–100.0)
Platelets: 239 10*3/uL (ref 150–400)
Platelets: 258 10*3/uL (ref 150–400)
RBC: 4.64 MIL/uL (ref 3.87–5.11)
RBC: 4.82 MIL/uL (ref 3.87–5.11)
RDW: 13.6 % (ref 11.5–15.5)
RDW: 13.5 % (ref 11.5–15.5)
WBC: 10.4 10*3/uL (ref 4.0–10.5)
WBC: 10.4 10*3/uL (ref 4.0–10.5)

## 2011-09-19 LAB — URINALYSIS, ROUTINE W REFLEX MICROSCOPIC
Bilirubin Urine: NEGATIVE
Glucose, UA: 250 mg/dL — AB
Leukocytes, UA: NEGATIVE
Nitrite: NEGATIVE
Protein, ur: NEGATIVE mg/dL
Specific Gravity, Urine: 1.01 (ref 1.005–1.030)
Urobilinogen, UA: 0.2 mg/dL (ref 0.0–1.0)
pH: 5.5 (ref 5.0–8.0)

## 2011-09-19 LAB — URINE MICROSCOPIC-ADD ON

## 2011-09-19 LAB — PRO B NATRIURETIC PEPTIDE: Pro B Natriuretic peptide (BNP): 1538 pg/mL — ABNORMAL HIGH (ref 0–125)

## 2011-09-19 LAB — TROPONIN I: Troponin I: 0.98 ng/mL (ref <0.30)

## 2011-09-19 LAB — CREATININE, SERUM
Creatinine, Ser: 0.64 mg/dL (ref 0.50–1.10)
GFR calc Af Amer: >90 mL/min (ref >90)
GFR calc non Af Amer: >90 mL/min (ref >90)

## 2011-09-19 LAB — CARDIAC PANEL(CRET KIN+CKTOT+MB+TROPI)
CK, MB: 4.3 ng/mL — ABNORMAL HIGH (ref 0.3–4.0)
Relative Index: INVALID (ref 0.0–2.5)
Total CK: 42 U/L (ref 7–177)
Troponin I: <0.3 ng/mL (ref <0.30)

## 2011-09-19 MED ORDER — IPRATROPIUM BROMIDE 0.02 % IN SOLN
0.5000 mg | Freq: Once | RESPIRATORY_TRACT | Status: AC
  Administered 2011-09-19: 0.5 mg via RESPIRATORY_TRACT
  Filled 2011-09-19: qty 2.5

## 2011-09-19 MED ORDER — DIGOXIN 125 MCG PO TABS
0.1250 mg | ORAL_TABLET | Freq: Every day | ORAL | Status: DC
  Administered 2011-09-20 – 2011-09-22 (×2): 0.125 mg via ORAL
  Filled 2011-09-19 (×3): qty 1

## 2011-09-19 MED ORDER — NITROGLYCERIN 2 % TD OINT
1.0000 [in_us] | TOPICAL_OINTMENT | Freq: Once | TRANSDERMAL | Status: AC
  Administered 2011-09-19: 1 [in_us] via TOPICAL
  Filled 2011-09-19: qty 1

## 2011-09-19 MED ORDER — SPIRONOLACTONE 25 MG PO TABS
25.0000 mg | ORAL_TABLET | Freq: Every day | ORAL | Status: DC
  Administered 2011-09-20 – 2011-09-22 (×3): 25 mg via ORAL
  Filled 2011-09-19 (×5): qty 1

## 2011-09-19 MED ORDER — HEPARIN SODIUM (PORCINE) 5000 UNIT/ML IJ SOLN
5000.0000 [IU] | Freq: Three times a day (TID) | INTRAMUSCULAR | Status: DC
  Administered 2011-09-19 – 2011-09-22 (×8): 5000 [IU] via SUBCUTANEOUS
  Filled 2011-09-19 (×14): qty 1

## 2011-09-19 MED ORDER — ALBUTEROL SULFATE (5 MG/ML) 0.5% IN NEBU
5.0000 mg | INHALATION_SOLUTION | Freq: Once | RESPIRATORY_TRACT | Status: AC
  Administered 2011-09-19: 5 mg via RESPIRATORY_TRACT
  Filled 2011-09-19: qty 1

## 2011-09-19 MED ORDER — ONDANSETRON HCL 4 MG/2ML IJ SOLN: 4.0000 mg | Freq: Four times a day (QID) | INTRAMUSCULAR | Status: DC | PRN

## 2011-09-19 MED ORDER — ASPIRIN 81 MG PO CHEW
324.0000 mg | CHEWABLE_TABLET | Freq: Once | ORAL | Status: AC
  Administered 2011-09-19: 324 mg via ORAL
  Filled 2011-09-19: qty 4

## 2011-09-19 MED ORDER — ISOSORB DINITRATE-HYDRALAZINE 20-37.5 MG PO TABS
1.0000 | ORAL_TABLET | Freq: Three times a day (TID) | ORAL | Status: DC
  Administered 2011-09-19 – 2011-09-22 (×7): 1 via ORAL
  Filled 2011-09-19 (×14): qty 1

## 2011-09-19 MED ORDER — POTASSIUM CHLORIDE CRYS ER 20 MEQ PO TBCR
20.0000 meq | EXTENDED_RELEASE_TABLET | Freq: Two times a day (BID) | ORAL | Status: DC
  Administered 2011-09-19 – 2011-09-21 (×4): 20 meq via ORAL
  Filled 2011-09-19 (×6): qty 1

## 2011-09-19 MED ORDER — LISINOPRIL 10 MG PO TABS
10.0000 mg | ORAL_TABLET | Freq: Every day | ORAL | Status: DC
  Filled 2011-09-19 (×2): qty 1

## 2011-09-19 MED ORDER — CARVEDILOL 3.125 MG PO TABS
3.1250 mg | ORAL_TABLET | Freq: Two times a day (BID) | ORAL | Status: DC
  Administered 2011-09-19 – 2011-09-22 (×5): 3.125 mg via ORAL
  Filled 2011-09-19 (×11): qty 1

## 2011-09-19 MED ORDER — ALPRAZOLAM 0.25 MG PO TABS
0.2500 mg | ORAL_TABLET | Freq: Two times a day (BID) | ORAL | Status: DC | PRN
  Administered 2011-09-20 (×2): 0.25 mg via ORAL
  Filled 2011-09-19 (×2): qty 1

## 2011-09-19 MED ORDER — FUROSEMIDE 10 MG/ML IJ SOLN
40.0000 mg | Freq: Two times a day (BID) | INTRAMUSCULAR | Status: DC
  Administered 2011-09-20 – 2011-09-22 (×3): 40 mg via INTRAVENOUS
  Filled 2011-09-19 (×7): qty 4

## 2011-09-19 MED ORDER — ACETAMINOPHEN 325 MG PO TABS
650.0000 mg | ORAL_TABLET | ORAL | Status: DC | PRN
  Administered 2011-09-19 – 2011-09-22 (×4): 650 mg via ORAL
  Filled 2011-09-19 (×4): qty 2

## 2011-09-19 MED ORDER — SODIUM CHLORIDE 0.9 % IV SOLN: 250.0000 mL | INTRAVENOUS | Status: DC | PRN

## 2011-09-19 MED ORDER — PREDNISONE 20 MG PO TABS
60.0000 mg | ORAL_TABLET | Freq: Once | ORAL | Status: AC
  Administered 2011-09-19: 60 mg via ORAL
  Filled 2011-09-19: qty 3

## 2011-09-19 MED ORDER — NITROGLYCERIN IN D5W 200-5 MCG/ML-% IV SOLN
5.0000 ug/min | INTRAVENOUS | Status: DC
  Administered 2011-09-19: 5 ug/min via INTRAVENOUS
  Administered 2011-09-20: 50 ug/min via INTRAVENOUS
  Filled 2011-09-19 (×2): qty 250

## 2011-09-19 MED ORDER — SODIUM CHLORIDE 0.9 % IJ SOLN: 3.0000 mL | INTRAMUSCULAR | Status: DC | PRN

## 2011-09-19 MED ORDER — SODIUM CHLORIDE 0.9 % IJ SOLN
3.0000 mL | Freq: Two times a day (BID) | INTRAMUSCULAR | Status: DC
  Administered 2011-09-20 – 2011-09-22 (×4): 3 mL via INTRAVENOUS

## 2011-09-19 MED ORDER — FUROSEMIDE 40 MG PO TABS
40.0000 mg | ORAL_TABLET | Freq: Once | ORAL | Status: AC
  Administered 2011-09-19: 40 mg via ORAL
  Filled 2011-09-19: qty 1

## 2011-09-19 NOTE — ED Notes (Signed)
Sob and diarrhea

## 2011-09-19 NOTE — ED Notes (Signed)
Increased nitroglycerin infusion to 34mcg/min, 69ml/hr due to B/P - 220/132.

## 2011-09-19 NOTE — ED Provider Notes (Signed)
History   This chart was scribed for Anne Cable, MD by [name]. The patient was seen in room APA06/APA06 and the patient's care was started at 3:37PM.    CSN: [id number]  Arrival date & time 09/19/11  1348   First MD Initiated Contact with Patient 09/19/11 1533      Chief Complaint  Patient presents with  . Shortness of Breath     HPI [name] is a 56 y.o. female who presents [name] the Emergency Department complaining of persistent, moderate [name] severe SOB with associated wheezing with an onset 2 days ago. Pt states it hurts [name] breathe and that she feels like she has a fever, sweats, and mild CP. Laying flat aggravates the SOB. No HA, neck pain, sore throat, rash, back pain, abd pain, n/v/d, dysuria, or extremity pain,  weakness, numbness, or tingling. Family Hx of heart attacks - mother and father. No known allergies. No other pertinent medical symptoms.    Past Medical History  Diagnosis Date  . Hypertension   . Iron deficiency anemia 10/16/2010  . Mental handicap 10/16/2010    Past Surgical History  Procedure Date  . Cholecystectomy   . Abdominal hysterectomy   . Knee surgery     right knee @ 56 years of age  . Cesarean section   . Colonoscopy 08/2010    normal TI, sigmoid polyp (adenoma ). Next TCS due  08/2015,  . Esophagogastroduodenoscopy 08/2010    antral and duodenal erosions s/p bx (chronic gastritis, no h.pylori, no celiac dz ), hiatal hernia    Family History  Problem Relation Age of Onset  . Colon cancer Neg Hx   . Kidney disease      son reportedly has had cyst on kidney and lung s/p surgery at [facility]  . Diabetes Mother     History  Substance Use Topics  . Smoking status: Former Smoker -- 1.0 packs/day    Types: Cigarettes  . Smokeless tobacco: Never Used   Comment: quit a couple year ago  . Alcohol Use: No    OB History    Grav Para Term Preterm Abortions TAB SAB Ect Mult Living                  Review of Systems 10 Systems  reviewed and all are negative for acute change except as noted in the HPI.   Allergies  Review of patient's allergies indicates no known allergies.  Home Medications   Current Outpatient Rx  Name Route Sig Dispense Refill  . ACETAMINOPHEN 500 MG PO TABS Oral Take 1,000 mg by mouth every 6 (six) hours as needed. Pain/fever    . FERROUS SULFATE 325 (65 FE) MG PO TABS Oral Take 325 mg by mouth daily.      . NEBIVOLOL HCL 20 MG PO TABS Oral Take 20 mg by mouth daily.      BP 231/134  Pulse 74  Temp 97.3 F (36.3 C) (Oral)  Resp 16  Wt 269 lb 5 oz (122.159 kg)  SpO2 96%  Physical Exam CONSTITUTIONAL: Well developed/well nourished HEAD AND FACE: Normocephalic/atraumatic EYES: EOMI/PERRL ENMT: Mucous membranes moist NECK: supple no meningeal signs SPINE:entire spine nontender CV: S1/S2 noted, no murmurs/rubs/gallops noted LUNGS: Wheezing bilaterally but able [name] speak [name] clearly ABDOMEN: soft, nontender, no rebound or guarding GU:no cva tenderness NEURO: Pt is awake/alert, moves all extremitiesx4 EXTREMITIES: pulses normal, full ROM SKIN: warm, color normal PSYCH: no abnormalities of mood noted  ED Course  Procedures   CRITICAL CARE Performed by: [name]   Total critical care time: 45  Critical care time was exclusive of separately billable procedures and treating other patients.  Critical care was necessary [name] treat or prevent imminent or life-threatening deterioration.  Critical care was time spent personally by me on the following activities: development of treatment plan with patient and/or surrogate as well as nursing, discussions with consultants, evaluation of patient's response [name] treatment, examination of patient, obtaining history from patient or surrogate, ordering and performing treatments and interventions, ordering and review of laboratory studies, ordering and review of radiographic studies, pulse oximetry and re-evaluation of patient's  condition.   DIAGNOSTIC STUDIES: Oxygen Saturation is 98% on room air, normal by my interpretation.    COORDINATION OF CARE:  3:42PM - CXR and breathing tx will be ordered for the pt. 4:34PM - recheck; Pt denies known h/o CHF, but with wheezing, CXR findings and BP, likely new onset CHF Will start lasix and NTG 5:52PM -  Recheck; pt still has hypotension; pt will be admitted. 6:16 PM Pt with +troponin, after d/w [name], he prefers transfer [name] [name] [name] will accept [name] stepdown as patient will likely need cath NTG drip started Pt stabilized for transport   Labs Reviewed  CBC  DIFFERENTIAL  BASIC METABOLIC PANEL  PRO B NATRIURETIC PEPTIDE  TROPONIN I   Dg Chest 2 View  09/19/2011  *RADIOLOGY REPORT*  Clinical Data: Short of breath  CHEST - 2 VIEW  Comparison: 09/05/2010  Findings: Cardiac enlargement.  Vascular congestion with mild interstitial edema.  Right lower lobe atelectasis or infiltrate is present.  Small pleural effusions are present.  IMPRESSION: Fluid overload with interstitial edema and small pleural effusions.  Original Report Authenticated By: Truett Perna, M.D.      MDM  Nursing notes including past medical history and social history reviewed and considered in documentation xrays reviewed and considered All labs/vitals reviewed and considered     Date: 09/19/2011  Rate: 70  Rhythm: normal sinus rhythm  QRS Axis: normal  Intervals: normal  ST/T Wave abnormalities: normal  Conduction Disutrbances:none    I personally performed the services described in this documentation, which was scribed in my presence. The recorded information has been reviewed and considered.          Anne Cable, MD 09/19/11 [phone]

## 2011-09-19 NOTE — ED Notes (Signed)
CRITICAL VALUE ALERT  Critical value received:  Troponin 0.98  Date of notification:  09/19/11  Time of notification:  1800  Critical value read back:yes  Nurse who received alert:  c Marquisa Salih rn  MD notified (1st page):  Dr Amed Datta Gentles  Time of first page:  52  MD notified (2nd page):  Time of second page:  Responding MD:  Dr Aqil Goetting Gentles who is paging dr Legrand Rams to advise  Time MD responded:  1800

## 2011-09-19 NOTE — H&P (Addendum)
[name] is an 56 y.o. female.   Chief Complaint: Shortness of breath. HPI: 56 years old white female with 2 day history of worsening shortness of breath. Also has chest discomfort, orhtopnea and wheezing. H/O hypertension and Iron deficiency anemia.  Past Medical History  Diagnosis Date  . Hypertension   . Iron deficiency anemia 10/16/2010  . Mental handicap 10/16/2010      Past Surgical History  Procedure Date  . Cholecystectomy   . Abdominal hysterectomy   . Knee surgery     right knee @ 56 years of age  . Cesarean section   . Colonoscopy 08/2010    normal TI, sigmoid polyp (adenoma ). Next TCS due  08/2015,  . Esophagogastroduodenoscopy 08/2010    antral and duodenal erosions [name]/p bx (chronic gastritis, no h.pylori, no celiac dz ), hiatal hernia    Family History  Problem Relation Age of Onset  . Colon cancer Neg Hx   . Kidney disease      son reportedly has had cyst on kidney and lung [name]/p surgery at [facility]  . Diabetes Mother    Social History:  reports that she has quit smoking. Her smoking use included Cigarettes. She smoked 1 pack per day. She has never used smokeless tobacco. She reports that she does not drink alcohol or use illicit drugs.  Allergies: No Known Allergies  Medications Prior to Admission  Medication Sig Dispense Refill  . acetaminophen (TYLENOL) 500 MG tablet Take 1,000 mg by mouth every 6 (six) hours as needed. Pain/fever      . ferrous sulfate 325 (65 FE) MG tablet Take 325 mg by mouth daily.        . Nebivolol HCl (BYSTOLIC) 20 MG TABS Take 20 mg by mouth daily.        Results for orders placed during the hospital encounter of 09/19/11 (from the past 48 hour([name]))  CBC     Status: Normal   Collection Time   09/19/11  4:16 PM      Component Value Range Comment   WBC 10.4  4.0 - 10.5 K/uL    RBC 4.64  3.87 - 5.11 MIL/uL    Hemoglobin 14.4  12.0 - 15.0 g/dL    HCT 43.7  36.0 - 46.0 %    MCV 94.2  78.0 - 100.0 fL    MCH 31.0  26.0 - 34.0 pg    MCHC 33.0  30.0 - 36.0 g/dL    RDW 13.6  11.5 - 15.5 %    Platelets 239  150 - 400 K/uL   DIFFERENTIAL     Status: Normal   Collection Time   09/19/11  4:16 PM      Component Value Range Comment   Neutrophils Relative 74  43 - 77 %    Neutro Abs 7.7  1.7 - 7.7 K/uL    Lymphocytes Relative 20  12 - 46 %    Lymphs Abs 2.0  0.7 - 4.0 K/uL    Monocytes Relative 6  3 - 12 %    Monocytes Absolute 0.6  0.1 - 1.0 K/uL    Eosinophils Relative 1  0 - 5 %    Eosinophils Absolute 0.1  0.0 - 0.7 K/uL    Basophils Relative 0  0 - 1 %    Basophils Absolute 0.0  0.0 - 0.1 K/uL   BASIC METABOLIC PANEL     Status: Abnormal   Collection Time   09/19/11  4:16 PM  Component Value Range Comment   Sodium 134 (*) 135 - 145 mEq/L    Potassium 4.3  3.5 - 5.1 mEq/L    Chloride 97  96 - 112 mEq/L    CO2 23  19 - 32 mEq/L    Glucose, Bld 205 (*) 70 - 99 mg/dL    BUN 15  6 - 23 mg/dL    Creatinine, Ser 0.70  0.50 - 1.10 mg/dL    Calcium 9.6  8.4 - 10.5 mg/dL    GFR calc non Af Amer >90  >90 mL/min    GFR calc Af Amer >90  >90 mL/min   PRO B NATRIURETIC PEPTIDE     Status: Abnormal   Collection Time   09/19/11  4:16 PM      Component Value Range Comment   Pro B Natriuretic peptide (BNP) 1538.0 (*) 0 - 125 pg/mL   TROPONIN I     Status: Abnormal   Collection Time   09/19/11  4:16 PM      Component Value Range Comment   Troponin I 0.98 (*) <0.30 ng/mL   URINALYSIS, ROUTINE W REFLEX MICROSCOPIC     Status: Abnormal   Collection Time   09/19/11  8:25 PM      Component Value Range Comment   Color, Urine STRAW (*) YELLOW    APPearance HAZY (*) CLEAR    Specific Gravity, Urine 1.010  1.005 - 1.030    pH 5.5  5.0 - 8.0    Glucose, UA 250 (*) NEGATIVE mg/dL    Hgb urine dipstick SMALL (*) NEGATIVE    Bilirubin Urine NEGATIVE  NEGATIVE    Ketones, ur TRACE (*) NEGATIVE mg/dL    Protein, ur NEGATIVE  NEGATIVE mg/dL    Urobilinogen, UA 0.2  0.0 - 1.0 mg/dL    Nitrite NEGATIVE  NEGATIVE    Leukocytes, UA  NEGATIVE  NEGATIVE   URINE MICROSCOPIC-ADD ON     Status: Abnormal   Collection Time   09/19/11  8:25 PM      Component Value Range Comment   Squamous Epithelial / LPF RARE  RARE    WBC, UA 3-6  <3 WBC/hpf    RBC / HPF 3-6  <3 RBC/hpf    Bacteria, UA MANY (*) RARE   CBC     Status: Abnormal   Collection Time   09/19/11 10:45 PM      Component Value Range Comment   WBC 10.4  4.0 - 10.5 K/uL    RBC 4.82  3.87 - 5.11 MIL/uL    Hemoglobin 15.4 (*) 12.0 - 15.0 g/dL    HCT 44.1  36.0 - 46.0 %    MCV 91.5  78.0 - 100.0 fL    MCH 32.0  26.0 - 34.0 pg    MCHC 34.9  30.0 - 36.0 g/dL    RDW 13.5  11.5 - 15.5 %    Platelets 258  150 - 400 K/uL    Dg Chest 2 View  09/19/2011  *RADIOLOGY REPORT*  Clinical Data: Short of breath  CHEST - 2 VIEW  Comparison: 09/05/2010  Findings: Cardiac enlargement.  Vascular congestion with mild interstitial edema.  Right lower lobe atelectasis or infiltrate is present.  Small pleural effusions are present.  IMPRESSION: Fluid overload with interstitial edema and small pleural effusions.  Original Report Authenticated By: Truett Perna, M.D.    @ROS @  Blood pressure 211/91, pulse 81, temperature 98.6 F (37 C), temperature source Oral, resp. rate  22, weight 119.7 kg (263 lb 14.3 oz), SpO2 96.00%. Physical Exam  CONSTITUTIONAL: Well developed/well nourished, in mild distress  HEAD AND FACE: Normocephalic/atraumatic  EYES: EOMI/PERRL  ENT: Mucous membranes moist, Tongue pink, moist and midline NECK: supple, No JVD or bruit.  CV: S1/S2 noted, no rubs/gallops noted Gr. II/VI systolic murmur LSB LUNGS: Basal crackles bilaterally and able to speak to clearly  ABDOMEN: soft, nontender, no rebound or guarding  GU:no cva tenderness  NEURO: Pt is awake/alert, moves all extremities x 4  EXTREMITIES: pulses normal, full ROM  SKIN: warm, color normal  PSYCH: no abnormalities of mood noted   Assessment/Plan Acute left heart systolic  failure COPD Hypertension Obesity  Admit, IV lasix, add Imdur, lisinopril, B-blocker  [name] 09/19/2011, 11:38 PM

## 2011-09-20 LAB — BASIC METABOLIC PANEL
BUN: 15 mg/dL (ref 6–23)
CO2: 24 mEq/L (ref 19–32)
Calcium: 9.1 mg/dL (ref 8.4–10.5)
Chloride: 99 mEq/L (ref 96–112)
Creatinine, Ser: 0.68 mg/dL (ref 0.50–1.10)
GFR calc Af Amer: >90 mL/min (ref >90)
GFR calc non Af Amer: >90 mL/min (ref >90)
Glucose, Bld: 256 mg/dL — ABNORMAL HIGH (ref 70–99)
Potassium: 3.6 mEq/L (ref 3.5–5.1)
Sodium: 138 mEq/L (ref 135–145)

## 2011-09-20 LAB — CARDIAC PANEL(CRET KIN+CKTOT+MB+TROPI)
CK, MB: 3.4 ng/mL (ref 0.3–4.0)
CK, MB: 3.1 ng/mL (ref 0.3–4.0)
Relative Index: INVALID (ref 0.0–2.5)
Relative Index: 2.8 — ABNORMAL HIGH (ref 0.0–2.5)
Total CK: 50 U/L (ref 7–177)
Total CK: 109 U/L (ref 7–177)
Troponin I: 0.45 ng/mL (ref <0.30)
Troponin I: <0.3 ng/mL (ref <0.30)

## 2011-09-20 LAB — MRSA PCR SCREENING: MRSA by PCR: NEGATIVE

## 2011-09-20 LAB — TSH: TSH: 1.076 u[IU]/mL (ref 0.350–4.500)

## 2011-09-20 MED ORDER — SODIUM CHLORIDE 0.9 % IV SOLN: 250.0000 mL | INTRAVENOUS | Status: DC | PRN

## 2011-09-20 MED ORDER — HYDROCODONE-ACETAMINOPHEN 5-325 MG PO TABS
1.0000 | ORAL_TABLET | ORAL | Status: DC | PRN
  Administered 2011-09-20 – 2011-09-21 (×3): 1 via ORAL
  Filled 2011-09-20 (×3): qty 1

## 2011-09-20 MED ORDER — LISINOPRIL 20 MG PO TABS
20.0000 mg | ORAL_TABLET | Freq: Every day | ORAL | Status: DC
  Administered 2011-09-20: 10 mg via ORAL
  Administered 2011-09-21 – 2011-09-22 (×2): 20 mg via ORAL
  Filled 2011-09-20 (×4): qty 1

## 2011-09-20 MED ORDER — DIAZEPAM 5 MG PO TABS
5.0000 mg | ORAL_TABLET | ORAL | Status: AC
  Administered 2011-09-21: 5 mg via ORAL
  Filled 2011-09-20: qty 1

## 2011-09-20 MED ORDER — FUROSEMIDE 40 MG PO TABS
40.0000 mg | ORAL_TABLET | Freq: Every day | ORAL | Status: DC
  Administered 2011-09-20: 40 mg via ORAL
  Filled 2011-09-20 (×2): qty 1

## 2011-09-20 MED ORDER — ASPIRIN 81 MG PO CHEW
324.0000 mg | CHEWABLE_TABLET | ORAL | Status: AC
  Administered 2011-09-21: 324 mg via ORAL
  Filled 2011-09-20: qty 4

## 2011-09-20 MED ORDER — SODIUM CHLORIDE 0.9 % IJ SOLN: 3.0000 mL | Freq: Two times a day (BID) | INTRAMUSCULAR | Status: DC

## 2011-09-20 MED ORDER — SODIUM CHLORIDE 0.9 % IJ SOLN: 3.0000 mL | INTRAMUSCULAR | Status: DC | PRN

## 2011-09-20 NOTE — Progress Notes (Signed)
Subjective:  No chest pain. Good diuresis with lasix use.  Objective:  Vital Signs in the last 24 hours: Temp:  [97.3 F (36.3 C)-98.6 F (37 C)] 97.6 F (36.4 C) (06/23 0759) Pulse Rate:  [65-84] 77  (06/23 0800) Cardiac Rhythm:  [-] Normal sinus rhythm (06/23 0800) Resp:  [16-23] 20  (06/23 0800) BP: (164-241)/(72-134) 188/90 mmHg (06/23 0800) SpO2:  [94 %-98 %] 98 % (06/23 0800) FiO2 (%):  [21 %] 21 % (06/22 2209) Weight:  [119.7 kg (263 lb 14.3 oz)-122.159 kg (269 lb 5 oz)] 119.7 kg (263 lb 14.3 oz) (06/23 0500)  Physical Exam: BP Readings from Last 1 Encounters:  09/20/11 188/90    Wt Readings from Last 1 Encounters:  09/20/11 119.7 kg (263 lb 14.3 oz)    Weight change:   HEENT: [place]/AT, Eyes-Blue, PERL, EOMI, Conjunctiva-Pink, Sclera-Non-icteric Neck: No JVD, No bruit, Trachea midline. Lungs:  Clear, Bilateral. Cardiac:  Regular rhythm, normal S1 and S2, no S3.  Abdomen:  Soft, non-tender. Extremities:  Trace edema present. No cyanosis. No clubbing. CNS: AxOx3, Cranial nerves grossly intact, moves all 4 extremities. Right handed. Skin: Warm and dry.   Intake/Output from previous day: 06/22 0701 - 06/23 0700 In: 151.3 [I.V.:151.3] Out: 2950 [Urine:2950]    Lab Results: BMET    Component Value Date/Time   NA 138 09/20/2011 0555   K 3.6 09/20/2011 0555   CL 99 09/20/2011 0555   CO2 24 09/20/2011 0555   GLUCOSE 256* 09/20/2011 0555   BUN 15 09/20/2011 0555   CREATININE 0.68 09/20/2011 0555   CALCIUM 9.1 09/20/2011 0555   GFRNONAA >90 09/20/2011 0555   GFRAA >90 09/20/2011 0555   CBC    Component Value Date/Time   WBC 10.4 09/19/2011 2245   RBC 4.82 09/19/2011 2245   HGB 15.4* 09/19/2011 2245   HCT 44.1 09/19/2011 2245   PLT 258 09/19/2011 2245   MCV 91.5 09/19/2011 2245   MCH 32.0 09/19/2011 2245   MCHC 34.9 09/19/2011 2245   RDW 13.5 09/19/2011 2245   LYMPHSABS 2.0 09/19/2011 1616   MONOABS 0.6 09/19/2011 1616   EOSABS 0.1 09/19/2011 1616   BASOSABS 0.0 09/19/2011 1616    CARDIAC ENZYMES Lab Results  Component Value Date   CKTOTAL 50 09/20/2011   CKMB 3.4 09/20/2011   TROPONINI 0.45* 09/20/2011    Assessment/Plan:  Patient Active Hospital Problem List:  Acute left heart systolic failure  COPD  Hypertension  Obesity  R and Left heart cath in AM   LOS: 1 day    Dixie Dials  MD  09/20/2011, 9:14 AM

## 2011-09-21 ENCOUNTER — Encounter (HOSPITAL_COMMUNITY)
Admission: EM | Disposition: A | Discharge status: Home / Self Care | Payer: Self-pay | Source: Home / Self Care | Attending: Cardiovascular Disease

## 2011-09-21 HISTORY — PX: LEFT AND RIGHT HEART CATHETERIZATION WITH CORONARY ANGIOGRAM: SHX5449

## 2011-09-21 LAB — POCT ACTIVATED CLOTTING TIME: Activated Clotting Time: 119 seconds

## 2011-09-21 LAB — BASIC METABOLIC PANEL
BUN: 18 mg/dL (ref 6–23)
CO2: 25 mEq/L (ref 19–32)
Calcium: 8.9 mg/dL (ref 8.4–10.5)
Chloride: 94 mEq/L — ABNORMAL LOW (ref 96–112)
Creatinine, Ser: 0.74 mg/dL (ref 0.50–1.10)
GFR calc Af Amer: >90 mL/min (ref >90)
GFR calc non Af Amer: >90 mL/min (ref >90)
Glucose, Bld: 275 mg/dL — ABNORMAL HIGH (ref 70–99)
Potassium: 3.4 mEq/L — ABNORMAL LOW (ref 3.5–5.1)
Sodium: 131 mEq/L — ABNORMAL LOW (ref 135–145)

## 2011-09-21 LAB — GLUCOSE, CAPILLARY
Glucose-Capillary: 274 mg/dL — ABNORMAL HIGH (ref 70–99)
Glucose-Capillary: 267 mg/dL — ABNORMAL HIGH (ref 70–99)
Glucose-Capillary: 250 mg/dL — ABNORMAL HIGH (ref 70–99)

## 2011-09-21 LAB — POCT I-STAT 3, VENOUS BLOOD GAS (G3P V)
Acid-base deficit: 4 mmol/L — ABNORMAL HIGH (ref 0.0–2.0)
Bicarbonate: 22.3 mEq/L (ref 20.0–24.0)
O2 Saturation: 61 %
TCO2: 24 mmol/L (ref 0–100)
pCO2, Ven: 42.3 mmHg — ABNORMAL LOW (ref 45.0–50.0)
pH, Ven: 7.33 — ABNORMAL HIGH (ref 7.250–7.300)
pO2, Ven: 34 mmHg (ref 30.0–45.0)

## 2011-09-21 LAB — CBC
HCT: 39.7 % (ref 36.0–46.0)
Hemoglobin: 13.4 g/dL (ref 12.0–15.0)
MCH: 31.2 pg (ref 26.0–34.0)
MCHC: 33.8 g/dL (ref 30.0–36.0)
MCV: 92.5 fL (ref 78.0–100.0)
Platelets: 256 10*3/uL (ref 150–400)
RBC: 4.29 MIL/uL (ref 3.87–5.11)
RDW: 13.9 % (ref 11.5–15.5)
WBC: 12.7 10*3/uL — ABNORMAL HIGH (ref 4.0–10.5)

## 2011-09-21 LAB — POCT I-STAT 3, ART BLOOD GAS (G3+)
Acid-Base Excess: 3 mmol/L — ABNORMAL HIGH (ref 0.0–2.0)
Bicarbonate: 26.7 mEq/L — ABNORMAL HIGH (ref 20.0–24.0)
O2 Saturation: 97 %
TCO2: 28 mmol/L (ref 0–100)
pCO2 arterial: 36.4 mmHg (ref 35.0–45.0)
pH, Arterial: 7.474 — ABNORMAL HIGH (ref 7.350–7.400)
pO2, Arterial: 84 mmHg (ref 80.0–100.0)

## 2011-09-21 LAB — PROTIME-INR
INR: 1 (ref 0.00–1.49)
Prothrombin Time: 13.4 seconds (ref 11.6–15.2)

## 2011-09-21 SURGERY — LEFT AND RIGHT HEART CATHETERIZATION WITH CORONARY ANGIOGRAM
Anesthesia: Moderate Sedation

## 2011-09-21 MED ORDER — GLIPIZIDE 5 MG PO TABS
5.0000 mg | ORAL_TABLET | Freq: Two times a day (BID) | ORAL | Status: DC
  Administered 2011-09-22: 5 mg via ORAL
  Filled 2011-09-21 (×4): qty 1

## 2011-09-21 MED ORDER — HEPARIN (PORCINE) IN NACL 2-0.9 UNIT/ML-% IJ SOLN
INTRAMUSCULAR | Status: AC
  Filled 2011-09-21: qty 2000

## 2011-09-21 MED ORDER — INSULIN ASPART 100 UNIT/ML ~~LOC~~ SOLN: 0.0000 [IU] | Freq: Three times a day (TID) | SUBCUTANEOUS | Status: DC

## 2011-09-21 MED ORDER — SODIUM CHLORIDE 0.9 % IV SOLN
250.0000 mL | INTRAVENOUS | Status: DC
  Administered 2011-09-21: 250 mL via INTRAVENOUS

## 2011-09-21 MED ORDER — LIDOCAINE HCL (PF) 1 % IJ SOLN
INTRAMUSCULAR | Status: AC
  Filled 2011-09-21: qty 30

## 2011-09-21 MED ORDER — POTASSIUM CHLORIDE CRYS ER 20 MEQ PO TBCR
20.0000 meq | EXTENDED_RELEASE_TABLET | Freq: Three times a day (TID) | ORAL | Status: DC
  Administered 2011-09-21 – 2011-09-22 (×2): 20 meq via ORAL
  Filled 2011-09-21 (×4): qty 1

## 2011-09-21 MED ORDER — NITROGLYCERIN 0.2 MG/ML ON CALL CATH LAB
INTRAVENOUS | Status: AC
  Filled 2011-09-21: qty 1

## 2011-09-21 MED ORDER — INSULIN ASPART 100 UNIT/ML ~~LOC~~ SOLN
0.0000 [IU] | Freq: Three times a day (TID) | SUBCUTANEOUS | Status: DC
  Administered 2011-09-21 – 2011-09-22 (×2): 5 [IU] via SUBCUTANEOUS
  Administered 2011-09-22: 2 [IU] via SUBCUTANEOUS

## 2011-09-21 MED ORDER — MIDAZOLAM HCL 2 MG/2ML IJ SOLN
INTRAMUSCULAR | Status: AC
  Filled 2011-09-21: qty 2

## 2011-09-21 MED ORDER — NITROGLYCERIN IN D5W 200-5 MCG/ML-% IV SOLN
INTRAVENOUS | Status: AC
  Filled 2011-09-21: qty 250

## 2011-09-21 MED ORDER — OXYCODONE-ACETAMINOPHEN 5-325 MG PO TABS: 1.0000 | ORAL_TABLET | ORAL | Status: DC | PRN

## 2011-09-21 NOTE — Progress Notes (Signed)
Using aseptic technique removed foley, pt tolerated well

## 2011-09-21 NOTE — Progress Notes (Signed)
Call to Dr Doylene Canard to make aware of Pt having elevated CBG's today. After heart cath pt had CBG of 275, at 1200 CBG of 267 at 1645  CBG of 250 Orders received. Delray Alt RN

## 2011-09-21 NOTE — Progress Notes (Signed)
  Echocardiogram 2D Echocardiogram has been performed.  Diamond Nickel 09/21/2011, 4:34 PM

## 2011-09-21 NOTE — Progress Notes (Signed)
Subjective:  Here for R and L heart cath. Feeling better  Objective:  Vital Signs in the last 24 hours: Temp:  [97.6 F (36.4 C)-98.4 F (36.9 C)] 98 F (36.7 C) (06/24 0338) Pulse Rate:  [58-77] 58  (06/24 0400) Cardiac Rhythm:  [-] Normal sinus rhythm (06/23 2000) Resp:  [16-20] 18  (06/24 0400) BP: (127-188)/(74-90) 145/86 mmHg (06/24 0338) SpO2:  [95 %-98 %] 95 % (06/24 0400) Weight:  [116.6 kg (257 lb 0.9 oz)] 116.6 kg (257 lb 0.9 oz) (06/24 0500)  Physical Exam: BP Readings from Last 1 Encounters:  09/21/11 145/86    Wt Readings from Last 1 Encounters:  09/21/11 116.6 kg (257 lb 0.9 oz)    Weight change: -5.559 kg (-12 lb 4.1 oz)  HEENT: [place]/AT, Eyes-Blue, PERL, EOMI, Conjunctiva-Pink, Sclera-Non-icteric Neck: No JVD, No bruit, Trachea midline. Lungs:  Clear, Bilateral. Cardiac:  Regular rhythm, normal S1 and S2, no S3.  Abdomen:  Soft, non-tender. Extremities:  No edema present. No cyanosis. No clubbing. CNS: AxOx3, Cranial nerves grossly intact, moves all 4 extremities. Right handed. Skin: Warm and dry.   Intake/Output from previous day: 06/23 0701 - 06/24 0700 In: 1340 [P.O.:840; I.V.:500] Out: 950 [Urine:950]    Lab Results: BMET    Component Value Date/Time   NA 131* 09/21/2011 0042   K 3.4* 09/21/2011 0042   CL 94* 09/21/2011 0042   CO2 25 09/21/2011 0042   GLUCOSE 275* 09/21/2011 0042   BUN 18 09/21/2011 0042   CREATININE 0.74 09/21/2011 0042   CALCIUM 8.9 09/21/2011 0042   GFRNONAA >90 09/21/2011 0042   GFRAA >90 09/21/2011 0042   CBC    Component Value Date/Time   WBC 12.7* 09/21/2011 0042   RBC 4.29 09/21/2011 0042   HGB 13.4 09/21/2011 0042   HCT 39.7 09/21/2011 0042   PLT 256 09/21/2011 0042   MCV 92.5 09/21/2011 0042   MCH 31.2 09/21/2011 0042   MCHC 33.8 09/21/2011 0042   RDW 13.9 09/21/2011 0042   LYMPHSABS 2.0 09/19/2011 1616   MONOABS 0.6 09/19/2011 1616   EOSABS 0.1 09/19/2011 1616   BASOSABS 0.0 09/19/2011 1616   CARDIAC ENZYMES Lab Results    Component Value Date   CKTOTAL 109 09/20/2011   CKMB 3.1 09/20/2011   TROPONINI <0.30 09/20/2011    Assessment/Plan:  Patient Active Hospital Problem List: Acute Left Heart Systolic Failure. COPD Hypertension Obesity  R and L heart cath in AM   LOS: 2 days    Dixie Dials  MD  09/21/2011, 7:36 AM

## 2011-09-21 NOTE — CV Procedure (Signed)
PROCEDURE: Right and Left heart catheterization with selective coronary angiography, left ventriculogram.  CLINICAL HISTORY:  This is a 56 years old white female with 2 day history of shortness of breath + chest discomfort.  The risks, benefits, and details of the procedure were explained to the patient.  The patient verbalized understanding and wanted to proceed.  Informed written consent was obtained.  PROCEDURE TECHNIQUE:  The patient was approached from the right femoral artery using a 5 French short sheath.  Left coronary angiography was done using a Judkins L4 guide catheter.  Right coronary angiography was done using a Judkins R4 guide catheter.  Left ventriculography was done using a pigtail catheter. A 7 French short sheath was placed in right femoral vein and right heart pressures and cardiac output were measured using 7 French swan-Ganz catheter.   CONTRAST:  Total of 55 cc.  COMPLICATIONS:  None.  At the end of the procedure a manual device was used for hemostasis.    HEMODYNAMICS:  Aortic pressure was 194/91; LV pressure was 210/9; LVEDP 20.  There was no gradient between the left ventricle and aorta.  PA pressure was 39/13, Wedge was 11, RV was 44/6 and RA 11/8. Oxygen sat was 61 % in PA and 97 % in Aorta. Cardiac out put was 4.3L/min by Fick and 4.4L/min by Thermal method.  ANGIOGRAM/CORONARY ARTERIOGRAM:   The left main coronary artery is short and unremarkable..  The left anterior descending artery is without significant disease. Diagonal 1 is unremarkable  The left circumflex artery is codominant with proximal luminal irregularities. Ramus has osteal 30 % narrowing.  The right coronary artery is co-dominant and unremarkable.  LEFT VENTRICULOGRAM:  Left ventricular angiogram was done in the 30 RAO projection and revealed dilated left ventricle with generalized hypokinesia and moderate systolic dysfunction with an estimated ejection fraction of 35%.  LVEDP was 20  mmHg.  IMPRESSION OF HEART CATHETERIZATION:   1. Normal left main coronary artery. 2. Minimal disease of left anterior descending artery and its branches. 3. Mild disease of left circumflex artery and its branches. 4. Normal right coronary artery. 5. Modeate left ventricular systolic dysfunction.  LVEDP 20 mmHg.  Ejection fraction 35%. 6.  Mild pulmonary hypertension.  RECOMMENDATION:   Medical treatment with blood pressure control.

## 2011-09-21 NOTE — Progress Notes (Signed)
Pt arrived from cath lab placed in bed monitor applied. Rt femoral PP 2+Cath site level zero drsg clean and dry. IV NTG at 50 mcg/min BP 160/78 Pt alert & oriented

## 2011-09-21 NOTE — Plan of Care (Signed)
Problem: Phase I Progression Outcomes Goal: Pain controlled with appropriate interventions Outcome: Progressing Pt denies chest pain; however, has intermittent headache from NTG gtt  Problem: Consults Goal: Cardiac Cath Patient Education (See Patient Education module for education specifics.) Outcome: Completed/Met Date Met:  09/21/11 Pt watched cardiac cath/PCI video; verbalized understanding; signed consent form

## 2011-09-22 LAB — DIFFERENTIAL
Basophils Absolute: 0 10*3/uL (ref 0.0–0.1)
Basophils Relative: 0 % (ref 0–1)
Eosinophils Absolute: 0.1 10*3/uL (ref 0.0–0.7)
Eosinophils Relative: 1 % (ref 0–5)
Lymphocytes Relative: 19 % (ref 12–46)
Lymphs Abs: 2.3 10*3/uL (ref 0.7–4.0)
Monocytes Absolute: 1.2 10*3/uL — ABNORMAL HIGH (ref 0.1–1.0)
Monocytes Relative: 10 % (ref 3–12)
Neutro Abs: 8.4 10*3/uL — ABNORMAL HIGH (ref 1.7–7.7)
Neutrophils Relative %: 70 % (ref 43–77)

## 2011-09-22 LAB — GLUCOSE, CAPILLARY
Glucose-Capillary: 209 mg/dL — ABNORMAL HIGH (ref 70–99)
Glucose-Capillary: 215 mg/dL — ABNORMAL HIGH (ref 70–99)
Glucose-Capillary: 142 mg/dL — ABNORMAL HIGH (ref 70–99)

## 2011-09-22 LAB — CBC
HCT: 41 % (ref 36.0–46.0)
Hemoglobin: 13.9 g/dL (ref 12.0–15.0)
MCH: 31.3 pg (ref 26.0–34.0)
MCHC: 33.9 g/dL (ref 30.0–36.0)
MCV: 92.3 fL (ref 78.0–100.0)
Platelets: 288 10*3/uL (ref 150–400)
RBC: 4.44 MIL/uL (ref 3.87–5.11)
RDW: 14 % (ref 11.5–15.5)
WBC: 12 10*3/uL — ABNORMAL HIGH (ref 4.0–10.5)

## 2011-09-22 LAB — URINE CULTURE
Colony Count: NO GROWTH
Culture  Setup Time: 201306232041
Culture: NO GROWTH

## 2011-09-22 LAB — BASIC METABOLIC PANEL
BUN: 20 mg/dL (ref 6–23)
CO2: 23 mEq/L (ref 19–32)
Calcium: 8.9 mg/dL (ref 8.4–10.5)
Chloride: 96 mEq/L (ref 96–112)
Creatinine, Ser: 0.74 mg/dL (ref 0.50–1.10)
GFR calc Af Amer: >90 mL/min (ref >90)
GFR calc non Af Amer: >90 mL/min (ref >90)
Glucose, Bld: 220 mg/dL — ABNORMAL HIGH (ref 70–99)
Potassium: 3.7 mEq/L (ref 3.5–5.1)
Sodium: 132 mEq/L — ABNORMAL LOW (ref 135–145)

## 2011-09-22 MED ORDER — GLIPIZIDE 5 MG PO TABS: 5.0000 mg | ORAL_TABLET | Freq: Two times a day (BID) | ORAL | Status: DC

## 2011-09-22 MED ORDER — SPIRONOLACTONE 25 MG PO TABS: 25.0000 mg | ORAL_TABLET | Freq: Every day | ORAL | Status: DC

## 2011-09-22 MED ORDER — DIGOXIN 125 MCG PO TABS: 125.0000 ug | ORAL_TABLET | Freq: Every day | ORAL | Status: DC

## 2011-09-22 MED ORDER — LISINOPRIL 20 MG PO TABS: 20.0000 mg | ORAL_TABLET | Freq: Every day | ORAL | Status: DC

## 2011-09-22 MED ORDER — ISOSORB DINITRATE-HYDRALAZINE 20-37.5 MG PO TABS: 1.0000 | ORAL_TABLET | Freq: Three times a day (TID) | ORAL | Status: AC

## 2011-09-22 MED ORDER — POTASSIUM CHLORIDE CRYS ER 20 MEQ PO TBCR: 20.0000 meq | EXTENDED_RELEASE_TABLET | Freq: Two times a day (BID) | ORAL | Status: DC

## 2011-09-22 MED ORDER — FUROSEMIDE 40 MG PO TABS: 40.0000 mg | ORAL_TABLET | Freq: Every day | ORAL | Status: DC

## 2011-09-22 NOTE — Discharge Summary (Signed)
Physician Discharge Summary  Patient ID: [name] MRN: [id number] DOB/AGE: 1956/01/21 56 y.o.  Admit date: 09/19/2011 Discharge date: 09/22/2011  Admission Diagnoses: Acute Left Heart Systolic Failure.  COPD  Hypertension  Obesity  Discharge Diagnoses:  Principle diagnosis:  * Acute Left Heart Systolic Failure*.  COPD  Hypertension  Obesity DM, II, New onset  Discharged Condition: good  Hospital Course: 56 years old white female with 2 day history of worsening shortness of breath has acute systolic left heart failure. She responded well to IV Lasix. She underwent right and left heart catheterization showing mild one vessel Coronary Artery Disease. She was also found to be diabetic and was started on Glipizide. She was advised to decrease salt by 50 % and food like potato chips by 50-75 %. She will be followed by me in 2 weeks.  Consults: cardiology  Significant Diagnostic Studies: labs: Normal Hgb/Hct, Low Na of 132 meq/L, normal K+ post replacement, elevated glucose of 200-275 mg/dl range and angiography: Mild Ramus branch osteal disease and low EF of 35 % with global hypokinesia.  Treatments: cardiac meds: lisinopril (Zestril), carvedilol and furosemide and procedures: Right and left heart catheterization.  Discharge Exam: Blood pressure 129/65, pulse 67, temperature 97.2 F (36.2 C), temperature source Oral, resp. rate 18, height 5' (1.524 m), weight 115 kg (253 lb 8.5 oz), SpO2 96.00%. HEENT: [place]/AT, Eyes-Blue, PERL, EOMI, Conjunctiva-Pink, Sclera-Non-icteric  Neck: No JVD, No bruit, Trachea midline.  Lungs: Clear, Bilateral.  Cardiac: Regular rhythm, normal S1 and S2, no S3.  Abdomen: Soft, non-tender.  Extremities: No edema present. No cyanosis. No clubbing.  CNS: AxOx3, Cranial nerves grossly intact, moves all 4 extremities. Right handed.  Skin: Warm and dry.   Disposition: 01-Home or Self Care  Discharge Orders    Future Appointments: Provider: Department:  Dept Phone: Center:   10/02/2011 9:00 AM [place] [phone] None   01/01/2012 9:20 AM [place] [phone] None   01/05/2012 10:30 AM [facility] [place] [phone] None     Medication List  As of 09/22/2011  9:42 AM   STOP taking these medications         ferrous sulfate 325 (65 FE) MG tablet         TAKE these medications         acetaminophen 500 MG tablet   Commonly known as: TYLENOL   Take 1,000 mg by mouth every 6 (six) hours as needed. Pain/fever      BYSTOLIC 20 MG Tabs   Generic drug: Nebivolol HCl   Take 20 mg by mouth daily.      digoxin 0.125 MG tablet   Commonly known as: LANOXIN   Take 1 tablet (125 mcg total) by mouth daily.      furosemide 40 MG tablet   Commonly known as: LASIX   Take 1 tablet (40 mg total) by mouth daily.      glipiZIDE 5 MG tablet   Commonly known as: GLUCOTROL   Take 1 tablet (5 mg total) by mouth 2 (two) times daily before a meal.      potassium chloride SA 20 MEQ tablet   Commonly known as: K-DUR,KLOR-CON   Take 1 tablet (20 mEq total) by mouth 2 (two) times daily.      spironolactone 25 MG tablet   Commonly known as: ALDACTONE   Take 1 tablet (25 mg total) by mouth daily.  Follow-up Information    Follow up with [facility] S, MD. Schedule an appointment as soon as possible for a visit in 2 weeks.   Contact information:   [address] [place] [phone]          Signed: Birdie Riddle 09/22/2011, 9:42 AM

## 2011-09-22 NOTE — Progress Notes (Signed)
All d/c instructions explained and given to pt and son.  Verbalized understanding.  Karie Kirks, Therapist, sports.

## 2011-09-22 NOTE — Progress Notes (Signed)
[place]   Agencies that are Medicare-Certified and are affiliated with The [place]  Telephone Number Address  [place] has ownership interest in this company; however, you are under no obligation to use this agency. [phone] or  [place], [place] [address]   Agencies that are Medicare-Certified and are not affiliated with [facility] [place] Telephone Number Address  [facility] [phone] Fax [phone] [address] [place], [place]  [address]  [facility] [phone] or [phone] Fax [phone] [id number] [place], [place] [address]  [facility] [phone] Fax [phone] [place], [place] [address]  [place] [phone] Fax [phone] [address] [place], [place]  [address]  [facility] [phone] Fax [phone] [address] [place], [place] [address]  [facility] [place], [place] [address]  [facility] [phone]  [address], [place] [address]  [facility] [phone] or [phone] Fax [phone] [place] [address] [place] [address] [place], [place]  [address]  [facility] [phone] Fax [phone] [place], [place]  [address]  [place]  [phone] Fax [phone] [address] [place], [place] [address]               Agencies that are not Medicare-Certified and are not affiliated with The [place] Telephone Number Address  [facility] or [phone] Fax [phone] [address] [place] [address], [place]  [address]  [facility] [phone] Fax [phone] [address] [place], [place]  [address]  [facility]  [phone] Fax [phone] [address] [place], [place] [address]  [facility] [phone] Fax [phone] [address] [place], [place] [address]  [facility] [phone] or [phone] Fax [phone] [address] [place], [place]  [address]  [facility] [place] [phone] or [phone] Fax [phone] [address] [place], [place]  [address]  [facility] [phone] Fax [phone] [phone] [place], [place]  [address]  [facility] [phone] [address] [place], [place]  [address]  [place] [phone] Fax [phone] [address] [place], [place]  [address]  [place]. [phone] Fax [phone] [address] [place], [place]  [address]  [facility] [phone] Fax [phone] [address] [place], [place] [address]  [facility] [phone] Fax [phone] [address] [place],   [address]   In to speak with patient regarding possible need for home health RN for CHF and DM management. Choice of  agencies offered to patient. [facility]. Appropriate referrals will be made.

## 2011-09-22 NOTE — Discharge Instructions (Signed)
Arterial Hypertension Arterial hypertension (high blood pressure) is a condition of elevated pressure in your blood vessels. Hypertension over a long period of time is a risk factor for strokes, heart attacks, and heart failure. It is also the leading cause of kidney (renal) failure.  CAUSES   In Adults -- Over 90% of all hypertension has no known cause. This is called essential or primary hypertension. In the other 10% of people with hypertension, the increase in blood pressure is caused by another disorder. This is called secondary hypertension. Important causes of secondary hypertension are:   Heavy alcohol use.   Obstructive sleep apnea.   Hyperaldosterosim (Conn's syndrome).   Steroid use.   Chronic kidney failure.   Hyperparathyroidism.   Medications.   Renal artery stenosis.   Pheochromocytoma.   Cushing's disease.   Coarctation of the aorta.   Scleroderma renal crisis.   Licorice (in excessive amounts).   Drugs (cocaine, methamphetamine).  Your caregiver can explain any items above that apply to you.  In Children -- Secondary hypertension is more common and should always be considered.   Pregnancy -- Few women of childbearing age have high blood pressure. However, up to 10% of them develop hypertension of pregnancy. Generally, this will not harm the woman. It may be a sign of 3 complications of pregnancy: preeclampsia, HELLP syndrome, and eclampsia. Follow up and control with medication is necessary.  SYMPTOMS   This condition normally does not produce any noticeable symptoms. It is usually found during a routine exam.   Malignant hypertension is a late problem of high blood pressure. It may have the following symptoms:   Headaches.   Blurred vision.   End-organ damage (this means your kidneys, heart, lungs, and other organs are being damaged).   Stressful situations can increase the blood pressure. If a person with normal blood pressure has their blood  pressure go up while being seen by their caregiver, this is often termed "white coat hypertension." Its importance is not known. It may be related with eventually developing hypertension or complications of hypertension.   Hypertension is often confused with mental tension, stress, and anxiety.  DIAGNOSIS  The diagnosis is made by 3 separate blood pressure measurements. They are taken at least 1 week apart from each other. If there is organ damage from hypertension, the diagnosis may be made without repeat measurements. Hypertension is usually identified by having blood pressure readings:  Above 140/90 mmHg measured in both arms, at 3 separate times, over a couple weeks.   Over 130/80 mmHg should be considered a risk factor and may require treatment in patients with diabetes.  Blood pressure readings over 120/80 mmHg are called "pre-hypertension" even in non-diabetic patients. To get a true blood pressure measurement, use the following guidelines. Be aware of the factors that can alter blood pressure readings.  Take measurements at least 1 hour after caffeine.   Take measurements 30 minutes after smoking and without any stress. This is another reason to quit smoking - it raises your blood pressure.   Use a proper cuff size. Ask your caregiver if you are not sure about your cuff size.   Most home blood pressure cuffs are automatic. They will measure systolic and diastolic pressures. The systolic pressure is the pressure reading at the start of sounds. Diastolic pressure is the pressure at which the sounds disappear. If you are elderly, measure pressures in multiple postures. Try sitting, lying or standing.   Sit at rest for a minimum of   5 minutes before taking measurements.   You should not be on any medications like decongestants. These are found in many cold medications.   Record your blood pressure readings and review them with your caregiver.  If you have hypertension:  Your caregiver  may do tests to be sure you do not have secondary hypertension (see "causes" above).   Your caregiver may also look for signs of metabolic syndrome. This is also called Syndrome X or Insulin Resistance Syndrome. You may have this syndrome if you have type 2 diabetes, abdominal obesity, and abnormal blood lipids in addition to hypertension.   Your caregiver will take your medical and family history and perform a physical exam.   Diagnostic tests may include blood tests (for glucose, cholesterol, potassium, and kidney function), a urinalysis, or an EKG. Other tests may also be necessary depending on your condition.  PREVENTION  There are important lifestyle issues that you can adopt to reduce your chance of developing hypertension:  Maintain a normal weight.   Limit the amount of salt (sodium) in your diet.   Exercise often.   Limit alcohol intake.   Get enough potassium in your diet. Discuss specific advice with your caregiver.   Follow a DASH diet (dietary approaches to stop hypertension). This diet is rich in fruits, vegetables, and low-fat dairy products, and avoids certain fats.  PROGNOSIS  Essential hypertension cannot be cured. Lifestyle changes and medical treatment can lower blood pressure and reduce complications. The prognosis of secondary hypertension depends on the underlying cause. Many people whose hypertension is controlled with medicine or lifestyle changes can live a normal, healthy life.  RISKS AND COMPLICATIONS  While high blood pressure alone is not an illness, it often requires treatment due to its short- and long-term effects on many organs. Hypertension increases your risk for:  CVAs or strokes (cerebrovascular accident).   Heart failure due to chronically high blood pressure (hypertensive cardiomyopathy).   Heart attack (myocardial infarction).   Damage to the retina (hypertensive retinopathy).   Kidney failure (hypertensive nephropathy).  Your caregiver can  explain list items above that apply to you. Treatment of hypertension can significantly reduce the risk of complications. TREATMENT   For overweight patients, weight loss and regular exercise are recommended. Physical fitness lowers blood pressure.   Mild hypertension is usually treated with diet and exercise. A diet rich in fruits and vegetables, fat-free dairy products, and foods low in fat and salt (sodium) can help lower blood pressure. Decreasing salt intake decreases blood pressure in a 1/3 of people.   Stop smoking if you are a smoker.  The steps above are highly effective in reducing blood pressure. While these actions are easy to suggest, they are difficult to achieve. Most patients with moderate or severe hypertension end up requiring medications to bring their blood pressure down to a normal level. There are several classes of medications for treatment. Blood pressure pills (antihypertensives) will lower blood pressure by their different actions. Lowering the blood pressure by 10 mmHg may decrease the risk of complications by as much as 25%. The goal of treatment is effective blood pressure control. This will reduce your risk for complications. Your caregiver will help you determine the best treatment for you according to your lifestyle. What is excellent treatment for one person, may not be for you. HOME CARE INSTRUCTIONS   Do not smoke.   Follow the lifestyle changes outlined in the "Prevention" section.   If you are on medications, follow the directions   carefully. Blood pressure medications must be taken as prescribed. Skipping doses reduces their benefit. It also puts you at risk for problems.   Follow up with your caregiver, as directed.   If you are asked to monitor your blood pressure at home, follow the guidelines in the "Diagnosis" section above.  SEEK MEDICAL CARE IF:   You think you are having medication side effects.   You have recurrent headaches or lightheadedness.     You have swelling in your ankles.   You have trouble with your vision.  SEEK IMMEDIATE MEDICAL CARE IF:   You have sudden onset of chest pain or pressure, difficulty breathing, or other symptoms of a heart attack.   You have a severe headache.   You have symptoms of a stroke (such as sudden weakness, difficulty speaking, difficulty walking).  MAKE SURE YOU:   Understand these instructions.   Will watch your condition.   Will get help right away if you are not doing well or get worse.  Document Released: 03/16/2005 Document Revised: 03/05/2011 Document Reviewed: 10/14/2006 [facility] Patient Information 2012 [place].Heart Failure Heart failure (HF) is a condition in which the heart has trouble pumping blood. This means your heart does not pump blood efficiently for your body to work well. In some cases of HF, fluid may back up into your lungs or you may have swelling (edema) in your lower legs. HF is a long-term (chronic) condition. It is important for you to take good care of yourself and follow your caregiver's treatment plan. CAUSES   Health conditions:   High blood pressure (hypertension) causes the heart muscle to work harder than normal. When pressure in the blood vessels is high, the heart needs to pump (contract) with more force in order to circulate blood throughout the body. High blood pressure eventually causes the heart to become stiff and weak.   Coronary artery disease (CAD) is the buildup of cholesterol and fat (plaques) in the arteries of the heart. The blockage in the arteries deprives the heart muscle of oxygen and blood. This can cause chest pain and may lead to a heart attack. High blood pressure can also contribute to CAD.   Heart attack (myocardial infarction) occurs when 1 or more arteries in the heart become blocked. The loss of oxygen damages the muscle tissue of the heart. When this happens, part of the heart muscle dies. The injured tissue does not  contract as well and weakens the heart's ability to pump blood.   Abnormal heart valves can cause HF when the heart valves do not open and close properly. This makes the heart muscle pump harder to keep the blood flowing.   Heart muscle disease (cardiomyopathy or myocarditis) is damage to the heart muscle from a variety of causes. These can include drug or alcohol abuse, infections, or unknown reasons. These can increase the risk of HF.   Lung disease makes the heart work harder because the lungs do not work properly. This can cause a strain on the heart leading it to fail.   Diabetes increases the risk of HF. High blood sugar contributes to high fat (lipid) levels in the blood. Diabetes can also cause slow damage to tiny blood vessels that carry important nutrients to the heart muscle. When the heart does not get enough oxygen and food, it can cause the heart to become weak and stiff. This leads to a heart that does not contract efficiently.   Other diseases can contribute to HF. These  include abnormal heart rhythms, thyroid problems, and low blood counts (anemia).   Unhealthy lifestyle habits:   Obesity.   Smoking.   Eating foods high in fat and cholesterol.   Eating or drinking beverages high in salt.   Drug or alcohol abuse.   Lack of exercise.  SYMPTOMS  HF symptoms may vary and can be hard to detect. Symptoms may include:  Shortness of breath with activity, such as climbing stairs.   Persistent cough.   Swelling of the feet, ankles, legs, or abdomen.   Unexplained weight gain.   Difficulty breathing when lying flat.   Waking from sleep because of the need to sit up and get more air.   Rapid heartbeat.   Fatigue and loss of energy.   Feeling lightheaded or close to fainting.  DIAGNOSIS  A diagnosis of HF is based on your history, symptoms, physical examination, and diagnostic tests. Diagnostic tests for HF may include:  EKG.   Chest X-ray.   Blood tests.    Exercise stress test.   Blood oxygen test (arterial blood gas).   Evaluation by a heart doctor (cardiologist).   Ultrasound evaluation of the heart (echocardiogram).   Heart artery test to look for blockages (angiogram).   Radioactive imaging to look at the heart (radionuclide test).  TREATMENT  Treatment is aimed at managing the symptoms of HF. Medicines, lifestyle changes, or surgical intervention may be necessary to treat HF.  Medicines to help treat HF may include:   Angiotensin-converting enzyme (ACE) inhibitors. These block the effects of a blood protein called angiotensin-converting enzyme. ACE inhibitors relax (dilate) the blood vessels and help lower blood pressure. This decreases the workload of the heart, slows the progression of HF, and improves symptoms.   Angiotensin receptor blockers (ARBs). These medications work similar to ACE inhibitors. ARBs may be an alternative for people who cannot tolerate an ACE inhibitor.   Aldosterone antagonists. This medication helps get rid of extra fluid from your body. This lowers the volume of blood the heart has to pump.   Water pills (diuretics). Diuretics cause the kidneys to remove salt and water from the blood. The extra fluid is removed by urination. By removing extra fluid from the body, diuretics help lower the workload of the heart and help prevent fluid buildup in the lungs so breathing is easier.   Beta blockers. These prevent the heart from beating too fast and improve heart muscle strength. Beta blockers help maintain a normal heart rate, control blood pressure, and improve HF symptoms.   Digitalis. This increases the force of the heartbeat and may be helpful to people with HF or heart rhythm problems.   Healthy lifestyle changes include:   Stopping smoking.   Eating a healthy diet. Avoid foods high in fat. Avoid foods fried in oil or made with fat. A dietician can help with healthy food choices.   Limiting how much  salt you eat.   Limiting alcohol intake to no more than 1 drink per day for women and 2 drinks per day for men. Drinking more than that is harmful to your heart. If your heart has already been damaged by alcohol or you have severe HF, drinking alcohol should be stopped completely.   Exercising as directed by your caregiver.   Surgical treatment for HF may include:   Procedures to open blocked arteries, repair damaged heart valves, or remove damaged heart muscle tissue.   A pacemaker to help heart muscle function and to control certain  abnormal heart rhythms.   A defibrillator to possibly prevent sudden cardiac death.  HOME CARE INSTRUCTIONS   Activity level. Your caregiver can help you determine what type of exercise program may be helpful. It is important to maintain your strength. Pace your physical activity to avoid shortness of breath or chest pain. Rest for 1 hour before and after meals. A cardiac rehabilitation program may be helpful to some people with HF.   Diet. Eat a heart healthy diet. Food choices should be low in saturated fat and cholesterol. Talk to a dietician to learn about heart healthy foods.   Salt intake. When you have HF, you need to limit the amount of salt you eat. Eat less than 1500 milligrams (mg) of salt per day or as recommended by your caregiver.   Weight monitoring. Weigh yourself every day. You should weigh yourself in the morning after you urinate and before you eat breakfast. Wear the same amount of clothing each time you weigh yourself. Record your weight daily. Bring your recorded weights to your clinic visits. Tell your caregiver right away if you have gained 3 lb/1.4 kg in 1 day, or 5 lb/2.3 kg in a week or whatever amount you were told to report.   Blood pressure monitoring. This should be done as directed by your caregiver. A home blood pressure cuff can be purchased at a drugstore. Record your blood pressure numbers and bring them to your clinic visits.  Tell your caregiver if you become dizzy or lightheaded upon standing up.   Smoking. If you are currently a smoker, it is time to quit. Nicotine makes your heart work harder by causing your blood vessels to constrict. Do not use nicotine gum or patches before talking to your caregiver.   Follow up. Be sure to schedule a follow-up visit with your caregiver. Keep all your appointments.  SEEK MEDICAL CARE IF:   Your weight increases by 3 lb/1.4 kg in 1 day or 5 lb/2.3 kg in a week.   You notice increasing shortness of breath that is unusual for you. This may happen during rest, sleep, or with activity.   You cough more than normal, especially with physical activity.   You notice more swelling in your hands, feet, ankles, or belly (abdomen).   You are unable to sleep because it is hard to breathe.   You cough up bloody mucus (sputum).   You begin to feel "jumping" or "fluttering" sensations (palpitations) in your chest.  SEEK IMMEDIATE MEDICAL CARE IF:   You have severe chest pain or pressure which may include symptoms such as:   Pain or pressure in the arms, neck, jaw, or back.   Feeling sweaty.   Feeling sick to your stomach (nauseous).   Feeling short of breath while at rest.   Having a fast or irregular heartbeat.   You experience stroke symptoms. These symptoms include:   Facial weakness or numbness.   Weakness or numbness in an arm, leg, or on one side of your body.   Blurred vision.   Difficulty talking or thinking.   Dizziness or fainting.   Severe headache.  THESE ARE MEDICAL EMERGENCIES. Do not wait to see if the symptoms go away. Call your local emergency services (911 in [place] DO NOT drive yourself to the hospital. IMPORTANT  Make a list of every medicine, vitamin, or herbal supplement you are taking. Keep the list with you at all times. Show it to your caregiver at every visit. Keep the  list up-to-date.   Ask your caregiver or pharmacist to write an  explanation of each medicine you are taking. This should include:   Why you are taking it.   The possible side effects.   The best time of day to take it.   Foods to take with it or what foods to avoid.   When to stop taking it.  MAKE SURE YOU:   Understand these instructions.   Will watch your condition.   Will get help right away if you are not doing well or get worse.  Document Released: 03/16/2005 Document Revised: 03/05/2011 Document Reviewed: 06/28/2009 [facility] Patient Information 2012 [place].

## 2011-09-22 NOTE — Progress Notes (Signed)
Inpatient Diabetes Program Recommendations  AACE/ADA: New Consensus Statement on Inpatient Glycemic Control (2009)  Target Ranges:  Prepandial:   less than 140 mg/dL      Peak postprandial:   less than 180 mg/dL (1-2 hours)      Critically ill patients:  140 - 180 mg/dL   Reason for Visit: Consult - newly diagnosed DM   56 years old white female with 2 day history of worsening shortness of breath. Also has chest discomfort, orhtopnea and wheezing. H/O hypertension and Iron deficiency anemia.  Results for RENEZMAE, DENKER (MRN [id number]) as of 09/22/2011 15:28  Ref. Range 09/19/2011 16:16 09/20/2011 05:55 09/21/2011 00:42 09/22/2011 05:30  Glucose Latest Range: 70-99 mg/dL 205 (H) 256 (H) 275 (H) 220 (H)  Results for ISADORE, KAMRATH (MRN [id number]) as of 09/22/2011 15:28  Ref. Range 09/22/2011 12:02  Glucose-Capillary Latest Range: 70-99 mg/dL 142 (H)   Note: Pt being discharged.  Briefly discussed diabetes, importance of taking meds, eating healthy and avoiding regular sodas and sweet, and exercise.  Answered dietary questions.  States mother has diabetes and she will assist her in checking blood sugars and making lifestyle changes.  Would recommend HgbA1C at MD visit to assess glycemic control over past 2 - 3 months.

## 2011-09-22 NOTE — Plan of Care (Signed)
Problem: Food- and Nutrition-Related Knowledge Deficit (NB-1.1) Goal: Nutrition education Formal process to instruct or train a patient/client in a skill or to impart knowledge to help patients/clients voluntarily manage or modify food choices and eating behavior to maintain or improve health.  Outcome: Completed/Met Date Met:  09/22/11 RD consulted for diet education for new onset DM. Pt also with CHF, has had education from RN for low sodium diet. Pt provided with Academy of [employer]etics hand out for type 2 Diabetes.  Pt states she has difficulty seeing, RD went over the hand out with pt. Pt with very limited knowledge of food, RD explained what carbohydrate are and how they will effect blood sugars. Went over how much carbohydrate to eat at each meal and basic portion sizes. Pt with many food specific questions, RD answered questions and encouraged pt to share the handout with family members who would be helping her with meals. RD also encouraged pt to follow low sodium diet. Pt reports her mother is also diabetic and they will try to make changes together.  Body mass index is 49.51 kg/(m^2). Pt is morbidly obese.  CBG (last 3)   Basename 09/22/11 0549 09/21/11 2123 09/21/11 1649  GLUCAP 215* 209* 250*   No results found for this basename: HGBA1C   No A1c on file, recommend obtaining to confirm dx of DM.  No additional nutrition interventions at this time. Please re-consult if needed.   Orson Slick [place]

## 2011-09-22 NOTE — Care Management Note (Signed)
    Page 1 of 1   09/22/2011     2:35:31 PM   CARE MANAGEMENT NOTE 09/22/2011  Patient:  TRUST, AHEARN A   Account Number:  [id number]  Date Initiated:  09/22/2011  Documentation initiated by:  Kellie Moor  Subjective/Objective Assessment:   admitted on 09/19/11 with c/o shortness of breath.     Action/Plan:   telemetry  enzymes  cath   Anticipated DC Date:  09/22/2011   Anticipated DC Plan:  [place]  CM consult  CM consult      [facility] Choice  HOME HEALTH   Choice offered to / List presented to:  C-1 Patient        [place] arranged  [place].   Status of service:  Completed, signed off Medicare Important Message given?   (If response is "NO", the following Medicare IM given date fields will be blank) Date Medicare IM given:   Date Additional Medicare IM given:    Discharge Disposition:  [place]  Per UR Regulation:  Reviewed for med. necessity/level of care/duration of stay  If discussed at [place] of Stay Meetings, dates discussed:    Comments:  09/22/11-1150-J.Jania Steinke,RN,BSN [id number]      In to speak to patient regarding need for home health RN for disease management. Choice of agencies offered to patient. [facility]. Marie,RN with [facility] notified of referral.

## 2011-10-02 ENCOUNTER — Other Ambulatory Visit (HOSPITAL_COMMUNITY): Payer: Medicare PPO

## 2011-10-05 ENCOUNTER — Other Ambulatory Visit (HOSPITAL_COMMUNITY): Payer: Self-pay | Admitting: Family Medicine

## 2011-10-05 ENCOUNTER — Ambulatory Visit (HOSPITAL_COMMUNITY)
Admission: RE | Admit: 2011-10-05 | Discharge: 2011-10-05 | Disposition: A | Discharge status: Home / Self Care | Payer: Medicare PPO | Source: Ambulatory Visit | Attending: Family Medicine | Admitting: Family Medicine

## 2011-10-05 DIAGNOSIS — M542 Cervicalgia: Secondary | ICD-10-CM

## 2011-10-06 ENCOUNTER — Encounter (HOSPITAL_COMMUNITY): Payer: Self-pay | Admitting: *Deleted

## 2011-10-06 ENCOUNTER — Inpatient Hospital Stay (HOSPITAL_COMMUNITY)
Admission: EM | Admit: 2011-10-06 | Discharge: 2011-10-09 | DRG: 683 | Disposition: A | Discharge status: Home Health | Payer: Medicare PPO | Attending: Family Medicine | Admitting: Family Medicine

## 2011-10-06 DIAGNOSIS — E875 Hyperkalemia: Secondary | ICD-10-CM | POA: Diagnosis present

## 2011-10-06 DIAGNOSIS — N289 Disorder of kidney and ureter, unspecified: Secondary | ICD-10-CM

## 2011-10-06 DIAGNOSIS — Z6841 Body Mass Index (BMI) 40.0 and over, adult: Secondary | ICD-10-CM

## 2011-10-06 DIAGNOSIS — E119 Type 2 diabetes mellitus without complications: Secondary | ICD-10-CM | POA: Diagnosis present

## 2011-10-06 DIAGNOSIS — N179 Acute kidney failure, unspecified: Principal | ICD-10-CM | POA: Diagnosis present

## 2011-10-06 DIAGNOSIS — Z79899 Other long term (current) drug therapy: Secondary | ICD-10-CM

## 2011-10-06 DIAGNOSIS — N189 Chronic kidney disease, unspecified: Secondary | ICD-10-CM

## 2011-10-06 DIAGNOSIS — E860 Dehydration: Secondary | ICD-10-CM

## 2011-10-06 DIAGNOSIS — I10 Essential (primary) hypertension: Secondary | ICD-10-CM | POA: Diagnosis present

## 2011-10-06 DIAGNOSIS — E669 Obesity, unspecified: Secondary | ICD-10-CM | POA: Diagnosis present

## 2011-10-06 DIAGNOSIS — D509 Iron deficiency anemia, unspecified: Secondary | ICD-10-CM | POA: Diagnosis present

## 2011-10-06 LAB — BASIC METABOLIC PANEL
BUN: 50 mg/dL — ABNORMAL HIGH (ref 6–23)
CO2: 17 mEq/L — ABNORMAL LOW (ref 19–32)
Calcium: 9.7 mg/dL (ref 8.4–10.5)
Chloride: 112 mEq/L (ref 96–112)
Creatinine, Ser: 2.31 mg/dL — ABNORMAL HIGH (ref 0.50–1.10)
GFR calc Af Amer: 26 mL/min — ABNORMAL LOW (ref >90)
GFR calc non Af Amer: 23 mL/min — ABNORMAL LOW (ref >90)
Glucose, Bld: 72 mg/dL (ref 70–99)
Potassium: 5.7 mEq/L — ABNORMAL HIGH (ref 3.5–5.1)
Sodium: 139 mEq/L (ref 135–145)

## 2011-10-06 MED ORDER — HYDROCODONE-ACETAMINOPHEN 5-325 MG PO TABS
1.0000 | ORAL_TABLET | ORAL | Status: DC | PRN
  Administered 2011-10-06 – 2011-10-07 (×2): 1 via ORAL
  Filled 2011-10-06: qty 1
  Filled 2011-10-06: qty 29

## 2011-10-06 MED ORDER — NEBIVOLOL HCL 10 MG PO TABS
20.0000 mg | ORAL_TABLET | Freq: Every day | ORAL | Status: DC
  Administered 2011-10-07 – 2011-10-09 (×3): 20 mg via ORAL
  Filled 2011-10-06 (×8): qty 2

## 2011-10-06 MED ORDER — SODIUM CHLORIDE 0.9 % IV SOLN
INTRAVENOUS | Status: DC
  Administered 2011-10-06 – 2011-10-07 (×2): via INTRAVENOUS

## 2011-10-06 MED ORDER — GLIPIZIDE 5 MG PO TABS
5.0000 mg | ORAL_TABLET | Freq: Two times a day (BID) | ORAL | Status: DC
  Administered 2011-10-07 – 2011-10-09 (×5): 5 mg via ORAL
  Filled 2011-10-06 (×8): qty 1

## 2011-10-06 MED ORDER — ZOLPIDEM TARTRATE 5 MG PO TABS: 5.0000 mg | ORAL_TABLET | Freq: Every evening | ORAL | Status: DC | PRN

## 2011-10-06 MED ORDER — LISINOPRIL 10 MG PO TABS
20.0000 mg | ORAL_TABLET | Freq: Every day | ORAL | Status: DC
  Administered 2011-10-07: 20 mg via ORAL
  Filled 2011-10-06 (×3): qty 2

## 2011-10-06 MED ORDER — DIGOXIN 125 MCG PO TABS
125.0000 ug | ORAL_TABLET | Freq: Every day | ORAL | Status: DC
  Administered 2011-10-07: 125 ug via ORAL
  Filled 2011-10-06: qty 1

## 2011-10-06 MED ORDER — ISOSORB DINITRATE-HYDRALAZINE 20-37.5 MG PO TABS
1.0000 | ORAL_TABLET | Freq: Three times a day (TID) | ORAL | Status: DC
  Administered 2011-10-07 (×2): 1 via ORAL
  Filled 2011-10-06 (×9): qty 1

## 2011-10-06 MED ORDER — ACETAMINOPHEN 500 MG PO TABS
500.0000 mg | ORAL_TABLET | ORAL | Status: DC | PRN
  Administered 2011-10-06: 500 mg via ORAL
  Filled 2011-10-06: qty 1

## 2011-10-06 NOTE — ED Provider Notes (Signed)
History     CSN: [id number]  Arrival date & time 10/06/11  1618   First MD Initiated Contact with Patient 10/06/11 1747      Chief Complaint  Patient presents with  . Follow-up    (Consider location/radiation/quality/duration/timing/severity/associated sxs/prior treatment) Patient is a 56 y.o. female presenting with weakness. The history is provided by the patient (jpt complains of weakness and she was sent to the hospital by her md with high potassium). No language interpreter was used.  Weakness The primary symptoms include dizziness. Primary symptoms do not include headaches, seizures or loss of sensation. The symptoms began 2 days ago. The symptoms are unchanged. The neurological symptoms are multifocal. Context: nothing.  Dizziness also occurs with weakness.   Additional symptoms include weakness. Additional symptoms do not include hallucinations. Medical issues do not include alcohol use.    Past Medical History  Diagnosis Date  . Hypertension   . Iron deficiency anemia 10/16/2010  . Mental handicap 10/16/2010    Past Surgical History  Procedure Date  . Cholecystectomy   . Abdominal hysterectomy   . Knee surgery     right knee @ 56 years of age  . Cesarean section   . Colonoscopy 08/2010    normal TI, sigmoid polyp (adenoma ). Next TCS due  08/2015,  . Esophagogastroduodenoscopy 08/2010    antral and duodenal erosions s/p bx (chronic gastritis, no h.pylori, no celiac dz ), hiatal hernia    Family History  Problem Relation Age of Onset  . Colon cancer Neg Hx   . Kidney disease      son reportedly has had cyst on kidney and lung s/p surgery at [facility]  . Diabetes Mother     History  Substance Use Topics  . Smoking status: Former Smoker -- 1.0 packs/day    Types: Cigarettes  . Smokeless tobacco: Never Used   Comment: quit a couple year ago  . Alcohol Use: No    OB History    Grav Para Term Preterm Abortions TAB SAB Ect Mult Living                  Review  of Systems  Constitutional: Negative for fatigue.  HENT: Negative for congestion, sinus pressure and ear discharge.   Eyes: Negative for discharge.  Respiratory: Negative for cough.   Cardiovascular: Negative for chest pain.  Gastrointestinal: Negative for abdominal pain and diarrhea.  Genitourinary: Negative for frequency and hematuria.  Musculoskeletal: Negative for back pain.  Skin: Negative for rash.  Neurological: Positive for dizziness and weakness. Negative for seizures and headaches.  Hematological: Negative.   Psychiatric/Behavioral: Negative for hallucinations.    Allergies  Review of patient's allergies indicates no known allergies.  Home Medications   Current Outpatient Rx  Name Route Sig Dispense Refill  . DIGOXIN 0.125 MG PO TABS Oral Take 1 tablet (125 mcg total) by mouth daily. 30 tablet 1  . FUROSEMIDE 40 MG PO TABS Oral Take 1 tablet (40 mg total) by mouth daily. 30 tablet 1  . GLIPIZIDE 5 MG PO TABS Oral Take 1 tablet (5 mg total) by mouth 2 (two) times daily before a meal. 60 tablet 1  . HYDROCODONE-ACETAMINOPHEN 5-325 MG PO TABS Oral Take 1 tablet by mouth every 4 (four) hours as needed.    . ISOSORB DINITRATE-HYDRALAZINE 20-37.5 MG PO TABS Oral Take 1 tablet by mouth 3 (three) times daily. 90 tablet 1  . LISINOPRIL 20 MG PO TABS Oral Take 1 tablet (20 mg  total) by mouth daily. 30 tablet 1  . NEBIVOLOL HCL 20 MG PO TABS Oral Take 20 mg by mouth daily.    Marland Kitchen SPIRONOLACTONE 25 MG PO TABS Oral Take 1 tablet (25 mg total) by mouth daily. 30 tablet 1    BP 106/55  Pulse 59  Resp 20  SpO2 98%  Physical Exam  Constitutional: She is oriented to person, place, and time. She appears well-developed.  HENT:  Head: Normocephalic and atraumatic.  Eyes: Conjunctivae and EOM are normal. No scleral icterus.  Neck: Neck supple. No thyromegaly present.  Cardiovascular: Normal rate and regular rhythm.  Exam reveals no gallop and no friction rub.   No murmur  heard. Pulmonary/Chest: No stridor. She has no wheezes. She has no rales. She exhibits no tenderness.  Abdominal: She exhibits no distension. There is no tenderness. There is no rebound.  Musculoskeletal: Normal range of motion. She exhibits no edema.  Lymphadenopathy:    She has no cervical adenopathy.  Neurological: She is oriented to person, place, and time. Coordination normal.  Skin: No rash noted. No erythema.  Psychiatric: She has a normal mood and affect. Her behavior is normal.    ED Course  Procedures (including critical care time)  Labs Reviewed  BASIC METABOLIC PANEL - Abnormal; Notable for the following:    Potassium 5.7 (*)     CO2 17 (*)     BUN 50 (*)     Creatinine, Ser 2.31 (*)     GFR calc non Af Amer 23 (*)     GFR calc Af Amer 26 (*)     All other components within normal limits   Dg Cervical Spine Complete  10/05/2011  *RADIOLOGY REPORT*  Clinical Data: Posterior neck pain, worse with movement.  CERVICAL SPINE - 4+ VIEWS  Comparison:  None.  Findings:  There is no evidence of cervical spine fracture or prevertebral soft tissue swelling.  Alignment is normal.  No other significant bone abnormalities are identified.  IMPRESSION: Negative cervical spine radiographs.  Original Report Authenticated By: Staci Righter, M.D.     1. Hyperkalemia   2. Dehydration   3. Renal insufficiency     Date: 10/06/2011  Rate: 60  Rhythm: normal sinus rhythm  QRS Axis: normal  Intervals: normal  ST/T Wave abnormalities: nonspecific ST changes  Conduction Disutrbances:none  Narrative Interpretation:   Old EKG Reviewed: unchanged     MDM          Maudry Diego, MD 10/06/11 [phone]

## 2011-10-06 NOTE — ED Notes (Signed)
Pt states was sent to ED secondary to elevated K+ from Dr Everette Rank  Office. Pt reports is asymptomatic at this time. No arrythmia's noted on ekg or cardiac monitor. Pt denies leg pains. pottassium supplement stopped yesterday per Dr's office orders. No distress noted.

## 2011-10-06 NOTE — ED Notes (Addendum)
Pt states that she was told by Dr. Everette Rank office to come to er that her potassium was elevated, pt denies any complaints, recently discharged from [facility] on 09/22/2011, pt unsure of what her potassium level was.

## 2011-10-06 NOTE — H&P (Signed)
[name], [name]              ACCOUNT NO.:  [id number]  MEDICAL RECORD NO.:  [id number]  LOCATION:  APA12                         FACILITY:  [facility]  PHYSICIAN:  Estill Bamberg. Karie Kirks, M.D.DATE OF BIRTH:  1955/11/21  DATE OF ADMISSION:  10/06/2011 DATE OF DISCHARGE:  [facility]                             HISTORY & PHYSICAL   HISTORY OF PRESENT ILLNESS:  This 56 year old presented to the [facility] emergency department with fatigue.  She was sent over from the office of Dr. Marjean Donna, her local medical doctor.  She has generally been healthy, but developed weakness last month, and ended up at [facility] with a hypertensive emergency.  She is felt to have congestive heart failure with diabetes.  She was put on medication, and had an echo which showed mild concentric hypertrophy and a 50-55 percent ejection fraction.  She also had a cardiac catheterization June 24, which found a 30 percent narrowing of the ramus and some very mild LAD disease.  Ejection fraction at that time was 35 percent.  Further review of the record showed that colonoscopy showed a tubular adenoma, June, 2012, and an EGD at the same time show chronic gastritis, both these procedures at [facility].  Apparently, after her hospitalization, she was discharged home on medication, and was seen by Dr. Everette Rank in follow up today.  CURRENT MEDICATIONS:  Digoxin 0.125 mg daily, furosemide 40 mg daily, spironolactone 25 mg daily, lisinopril 20 mg daily, Bystolic 20 mg daily, isosorbide/hydralazine 20/37.5mg  daily, glipizide 5 mg b.i.d., and hydrocodone/APAP 5/325 q.4 hours as needed.  She tells me she lives with her mom and dad.  The record notes that she suffers from a mental handicap of which she seems to be a fairly good historian.  The record also noted she has iron deficiency anemia and hypertension.  In the past she has required iron infusions by hematology at this  hospital.  EXAM:  GENERAL:  This evening showed a pleasant middle-aged woman who was obese.  Her speech was normal. VITAL SIGNS:  Her pulse is 59, respiratory 20, blood pressure 106/55, O2 saturation 98%. HEART:  Regular rhythm today at a rate of about 60. LUNGS:  Her lungs appeared to be clear throughout.  She is moving air well.  Her abdomen was soft and obese without organomegaly or mass and she had just trace edema of the ankles and feet.  LABORATORY DATA:  Admission blood work showed a potassium 5.7, BUN 50, creatinine 2.31, up from a BUN of 20, creatinine 0.74 just 2 weeks ago. Her bicarb was 17.  Cervical spine x-rays were negative.  Yesterday and at 12 lead EKG today showed a normal sinus rhythm, incomplete left bundle branch block.  ADMISSION DIAGNOSIS: 1. Fatigue. 2. Renal insufficiency probably due to over diuresis. 3. Mild hyperkalemia. 4. Type 2 diabetes. 5. Benign essential hypertension. 6. Obesity. 7. Colonic polyps. 8. Chronic gastritis. 9. History of iron deficiency anemia. 10.Mental handicap by history.  At present I am going to continue on her current meds, but hold her spironolactone and furosemide.  She will be on IV normal saline at 75 mL an hour and we will repeat a  CBC, BMP in the morning.  She will be seen by her LMD, Dr. Everette Rank in the morning.     Estill Bamberg. Karie Kirks, M.D.     SDK/MEDQ  D:  10/06/2011  T:  10/06/2011  Job:  [id number]

## 2011-10-07 DIAGNOSIS — N189 Chronic kidney disease, unspecified: Secondary | ICD-10-CM

## 2011-10-07 DIAGNOSIS — N179 Acute kidney failure, unspecified: Principal | ICD-10-CM

## 2011-10-07 DIAGNOSIS — E875 Hyperkalemia: Secondary | ICD-10-CM

## 2011-10-07 LAB — CBC
HCT: 38.8 % (ref 36.0–46.0)
Hemoglobin: 12.4 g/dL (ref 12.0–15.0)
MCH: 30.8 pg (ref 26.0–34.0)
MCHC: 32 g/dL (ref 30.0–36.0)
MCV: 96.5 fL (ref 78.0–100.0)
Platelets: 231 10*3/uL (ref 150–400)
RBC: 4.02 MIL/uL (ref 3.87–5.11)
RDW: 14 % (ref 11.5–15.5)
WBC: 7.5 10*3/uL (ref 4.0–10.5)

## 2011-10-07 LAB — BASIC METABOLIC PANEL
BUN: 48 mg/dL — ABNORMAL HIGH (ref 6–23)
CO2: 19 mEq/L (ref 19–32)
Calcium: 9.5 mg/dL (ref 8.4–10.5)
Chloride: 111 mEq/L (ref 96–112)
Creatinine, Ser: 1.82 mg/dL — ABNORMAL HIGH (ref 0.50–1.10)
GFR calc Af Amer: 35 mL/min — ABNORMAL LOW (ref >90)
GFR calc non Af Amer: 30 mL/min — ABNORMAL LOW (ref >90)
Glucose, Bld: 108 mg/dL — ABNORMAL HIGH (ref 70–99)
Potassium: 5.7 mEq/L — ABNORMAL HIGH (ref 3.5–5.1)
Sodium: 138 mEq/L (ref 135–145)

## 2011-10-07 LAB — POTASSIUM
Potassium: 5.5 mEq/L — ABNORMAL HIGH (ref 3.5–5.1)
Potassium: 5.1 mEq/L (ref 3.5–5.1)

## 2011-10-07 LAB — PRO B NATRIURETIC PEPTIDE: Pro B Natriuretic peptide (BNP): 432.7 pg/mL — ABNORMAL HIGH (ref 0–125)

## 2011-10-07 LAB — MRSA PCR SCREENING: MRSA by PCR: NEGATIVE

## 2011-10-07 MED ORDER — INSULIN REGULAR HUMAN 100 UNIT/ML IJ SOLN
10.0000 [IU] | INTRAMUSCULAR | Status: AC
Start: 2011-10-07 — End: 2011-10-07
  Administered 2011-10-07: 10 [IU] via SUBCUTANEOUS
  Filled 2011-10-07: qty 0.1

## 2011-10-07 MED ORDER — DEXTROSE 50 % IV SOLN
INTRAVENOUS | Status: AC
  Administered 2011-10-07: 11:00:00
  Filled 2011-10-07: qty 50

## 2011-10-07 MED ORDER — DEXTROSE 50 % IV SOLN
50.0000 mL | INTRAVENOUS | Status: AC
  Administered 2011-10-07: 50 mL via INTRAVENOUS

## 2011-10-07 MED ORDER — INSULIN REGULAR HUMAN 100 UNIT/ML IJ SOLN
10.0000 [IU] | Freq: Once | INTRAMUSCULAR | Status: AC | PRN
  Administered 2011-10-07: 10 [IU] via SUBCUTANEOUS
  Filled 2011-10-07: qty 0.1

## 2011-10-07 MED ORDER — ENOXAPARIN SODIUM 40 MG/0.4ML ~~LOC~~ SOLN
40.0000 mg | Freq: Every day | SUBCUTANEOUS | Status: DC
  Administered 2011-10-07: 40 mg via SUBCUTANEOUS
  Filled 2011-10-07: qty 0.4

## 2011-10-07 MED ORDER — DEXTROSE 50 % IV SOLN
INTRAVENOUS | Status: AC
  Administered 2011-10-07: 50 mL via INTRAVENOUS
  Filled 2011-10-07: qty 50

## 2011-10-07 MED ORDER — SODIUM CHLORIDE 0.9 % IV SOLN
INTRAVENOUS | Status: DC
  Administered 2011-10-07: 13:00:00 via INTRAVENOUS
  Administered 2011-10-08: 1000 mL via INTRAVENOUS

## 2011-10-07 MED ORDER — DEXTROSE 50 % IV SOLN
50.0000 mL | Freq: Once | INTRAVENOUS | Status: AC | PRN
  Administered 2011-10-07: 50 mL via INTRAVENOUS

## 2011-10-07 NOTE — Progress Notes (Signed)
UR Chart Review Completed  

## 2011-10-07 NOTE — Consult Note (Signed)
[name]  56 y.o.  female  Subjective: Cardiology consultation kindly requested by Dr. Everette Rank for this nice woman with acute renal failure. She recently was admitted to [facility] with congestive heart failure following a previous admission for anemia requiring transfusion. She was referred to the hospital after complaining of weakness and was found to have hyperkalemia and an acute and moderate increase in BUN and creatinine.  Allergy: Review of patient's allergies indicates no known allergies.  Objective: Vital signs in last 24 hours: Temp:  [97.3 F (36.3 C)-97.6 F (36.4 C)] 97.3 F (36.3 C) (07/10 1600) Pulse Rate:  [61-70] 61  (07/10 1600) Resp:  [18-20] 18  (07/10 1600) BP: (96-158)/(56-74) 110/63 mmHg (07/10 1600) SpO2:  [96 %-98 %] 96 % (07/10 1600) Weight:  [113.7 kg (250 lb 10.6 oz)] 113.7 kg (250 lb 10.6 oz) (07/09 2210)  113.7 kg (250 lb 10.6 oz) Body mass index is 40.46 kg/(m^2).  Weight change:  Last BM Date: 10/06/11  Intake/Output from previous day: 07/09 0701 - 07/10 0700 In: 315 [P.O.:240; I.V.:75] Out: -   General- Well developed; no acute distress; moderately overweight Neck- No JVD, modest bilateral carotid bruits versus transmitted murmur Lungs- clear lung fields; normal I:E ratio; decreased breath sounds at the bases Cardiovascular- normal PMI; normal S1 and S2; modest basilar systolic ejection murmur Abdomen- normal bowel sounds; soft and non-tender without masses or organomegaly Skin- Warm, no significant lesions Extremities- Nl distal pulses; no edema  Lab Results: Cardiac Markers:  No results found for this basename: TROPONINI:2,CK,MB:2 in the last 72 hours CBC:   Basename 10/07/11 0440  WBC 7.5  HGB 12.4  HCT 38.8  PLT 231   BMET:  Basename 10/07/11 1207 10/07/11 0908 10/07/11 0440 10/06/11 1807  NA -- -- 138 139  K 5.1 5.5* -- --  CL -- -- 111 112  CO2 -- -- 19 17*  GLUCOSE -- -- 108* 72  BUN -- -- 48* 50*  CREATININE  -- -- 1.82* 2.31*  CALCIUM -- -- 9.5 9.7   Imaging: Imaging results have been reviewed.  Chest x-ray on 6/22 consistent with congestive heart failure; no followup film available.  Medications: I have reviewed the patient's current medications.  Infusions:     . sodium chloride 75 mL/hr at 10/07/11 1200  . sodium chloride 50 mL/hr at 10/07/11 1700   Assessment/Plan: Patient unfortunately presents for her third hospitalization following iron deficiency anemia of uncertain etiology.  A possible chain of events is that transfusions caused congestive heart failure. Aggressive therapy for CHF is now currently caused acute renal failure. Once renal function has returned to normal, weights can be followed closely and diuretics were resumed as required. If she does not need Aldactone or ACE inhibitor with normal systolic cardiac function. She is scheduled to return to see Dr. Doylene Canard, but I will be happy to assist with her care in hospital.   LOS: 1 day   Jacqulyn Ducking 10/07/2011, 5:28 PM

## 2011-10-07 NOTE — Progress Notes (Signed)
[name], [name]              ACCOUNT NO.:  [id number]  MEDICAL RECORD NO.:  [id number]  LOCATION:  IC06                          FACILITY:  [facility]  PHYSICIAN:  Skai Lickteig G. Everette Rank, MD   DATE OF BIRTH:  13-Jul-1955  DATE OF PROCEDURE: DATE OF DISCHARGE:                                PROGRESS NOTE   SUBJECTIVE:  This patient was admitted to the hospital through the emergency department.  She had been sent there because of elevated serum potassium.  This was above 6 in the office.  She had been on diuretics at home, mainly Lasix; this was stopped.  She had been recently treated for congestive heart failure in [place].  She had had a heart catheterization, which showed mild LAD disease.  Ejection fraction was 35%.  The patient was treated in the emergency department and subsequently was admitted to the intensive care unit.  OBJECTIVE:  VITAL SIGNS:  Blood pressure 96/62, respirations 20, pulse 64, temp 97.5. LUNGS:  Diminished breath sounds. HEART:  Regular rhythm. ABDOMEN:  No palpable organs or masses.  Slight obesity.  ASSESSMENT:  The patient was admitted with mild hyperkalemia, type 2 diabetes, essential hypertension, obesity.  PLAN:  To repeat BMET today.  She will obtain cardiology consult as well.  We will hold spironolactone and furosemide.     [name] G. Everette Rank, MD     AGM/MEDQ  D:  10/07/2011  T:  10/07/2011  Job:  [id number]

## 2011-10-07 NOTE — Clinical Documentation Improvement (Signed)
RENAL FAILURE DOCUMENTATION CLARIFICATION QUERY  THIS DOCUMENT IS NOT A PERMANENT PART OF THE MEDICAL RECORD  TO RESPOND TO THE THIS QUERY, FOLLOW THE INSTRUCTIONS BELOW:  1. If needed, update documentation for the patient's encounter via the notes activity.  2. Access this query again and click edit on the In [place].  3. After updating, or not, click F2 to complete all highlighted (required) fields concerning your review. Select "additional documentation in the medical record" OR "no additional documentation provided".  4. Click Sign note button.  5. The deficiency will fall out of your In Basket *Please let us know if you are not able to complete this workflow by phone or e-mail (listed below).  Please update your documentation within the medical record to reflect your response to this query.                                                                                    10/07/11  Dear Dr  Everette Rank,  In a better effort to capture your patient's severity of illness, reflect appropriate length of stay and utilization of resources, a review of the patient medical record has revealed the following indicators.  Based on your clinical judgment, please clarify and document in a progress note and/or discharge summary the clinical condition associated with the following supporting information:  In responding to this query please exercise your independent judgment.  The fact that a query is asked, does not imply that any particular answer is desired or expected.  Possible Clinical Conditions?  Acute Renal Failure Acute Kidney Injury Acute Tubular Necrosis Acute on Chronic Renal Failure Chronic Renal Failure Other Condition_____________ Cannot Clinically Determine     Supporting Information:  Clinical Information:  Risk Factors: Dehydration ED provider states "renal insufficency"  Diagnostics: BUN/CR/GFR: 6/25=20/0.74/>90 7/9=50/2.31/23 7/10=48/1.83/30  Treatments: IV  NS@75ml /h Monitoring BMP  You may use possible, probable, or suspect with inpatient documentation. possible, probable, suspected diagnoses MUST be documented at the time of discharge  Reviewed: additional documentation in the medical record Dr. Lattie Haw wrote "acute renal failure"  Thank You,  Estella Husk RN, MSN Clinical Documentation Specialist: Office# [phone] [place]

## 2011-10-08 ENCOUNTER — Inpatient Hospital Stay (HOSPITAL_COMMUNITY): Payer: Medicare PPO

## 2011-10-08 DIAGNOSIS — I10 Essential (primary) hypertension: Secondary | ICD-10-CM

## 2011-10-08 DIAGNOSIS — E860 Dehydration: Secondary | ICD-10-CM

## 2011-10-08 LAB — BASIC METABOLIC PANEL
BUN: 29 mg/dL — ABNORMAL HIGH (ref 6–23)
BUN: 28 mg/dL — ABNORMAL HIGH (ref 6–23)
CO2: 20 mEq/L (ref 19–32)
CO2: 20 mEq/L (ref 19–32)
Calcium: 9.3 mg/dL (ref 8.4–10.5)
Calcium: 9.2 mg/dL (ref 8.4–10.5)
Chloride: 113 mEq/L — ABNORMAL HIGH (ref 96–112)
Chloride: 114 mEq/L — ABNORMAL HIGH (ref 96–112)
Creatinine, Ser: 1.27 mg/dL — ABNORMAL HIGH (ref 0.50–1.10)
Creatinine, Ser: 1.25 mg/dL — ABNORMAL HIGH (ref 0.50–1.10)
GFR calc Af Amer: 54 mL/min — ABNORMAL LOW (ref >90)
GFR calc Af Amer: 55 mL/min — ABNORMAL LOW (ref >90)
GFR calc non Af Amer: 47 mL/min — ABNORMAL LOW (ref >90)
GFR calc non Af Amer: 48 mL/min — ABNORMAL LOW (ref >90)
Glucose, Bld: 123 mg/dL — ABNORMAL HIGH (ref 70–99)
Glucose, Bld: 132 mg/dL — ABNORMAL HIGH (ref 70–99)
Potassium: 5.2 mEq/L — ABNORMAL HIGH (ref 3.5–5.1)
Potassium: 5.6 mEq/L — ABNORMAL HIGH (ref 3.5–5.1)
Sodium: 140 mEq/L (ref 135–145)
Sodium: 140 mEq/L (ref 135–145)

## 2011-10-08 MED ORDER — SODIUM POLYSTYRENE SULFONATE 15 GM/60ML PO SUSP
15.0000 g | Freq: Once | ORAL | Status: AC
  Administered 2011-10-08: 15 g via ORAL
  Filled 2011-10-08: qty 60

## 2011-10-08 MED ORDER — ENOXAPARIN SODIUM 60 MG/0.6ML ~~LOC~~ SOLN
50.0000 mg | SUBCUTANEOUS | Status: DC
  Administered 2011-10-09: 50 mg via SUBCUTANEOUS
  Filled 2011-10-08: qty 0.6

## 2011-10-08 MED ORDER — ENOXAPARIN SODIUM 40 MG/0.4ML ~~LOC~~ SOLN
40.0000 mg | SUBCUTANEOUS | Status: DC
  Administered 2011-10-08: 40 mg via SUBCUTANEOUS
  Filled 2011-10-08: qty 0.4

## 2011-10-08 NOTE — Discharge Summary (Signed)
[name], SIGG              ACCOUNT NO.:  [id number]  MEDICAL RECORD NO.:  [id number]  LOCATION:  [id number]                          FACILITY:  [facility]  PHYSICIAN:  Glorious Flicker G. Everette Rank, MD   DATE OF BIRTH:  Sep 05, 1955  DATE OF ADMISSION:  10/06/2011 DATE OF DISCHARGE:  07/12/2013LH                              DISCHARGE SUMMARY   DIAGNOSES: 1. Fatigue. 2. Hyperkalemia. 3. Acute renal insufficiency secondary to over diuresis. 4. Congestive heart failure with low ejection fraction, resolved. 5. Hypertension. 6. Obesity. 7. Mental handicap.  CONDITION:  Stable and improved at the time of discharge.  This 56 year old patient presented to [facility] Emergency Department with fatigue.  She had been seen previously in the office and had been found to have elevated serum potassium on 2 occasions.  She was subsequently seen by emergency room physician.  The patient had previously been hospitalized at [facility] with congestive heart failure and diabetes, had mild concentric hypertrophy with 50% to 55% ejection fraction, had a cardiac catheterization on September 21, 2011, which found 30% narrowing of the ramus and some very mild LAD disease. Estimated ejection fraction at that time was 35%.  The patient was treated in the emergency department and subsequently was admitted.  Her spironolactone and furosemide were withheld at the time of admission.  PHYSICAL EXAMINATION ON ADMISSION:  VITAL SIGNS:  Pleasant female with blood pressure 106/55, heart rate 59, respirations 20. HEART:  Regular rhythm, rate 60. LUNGS:  Clear to P and A. ABDOMEN:  No palpable organs or masses. EXTREMITIES:  No ankle edema.  LAB DATA:  On admission, showed potassium 5.7, BUN 50, creatinine 2.31, up from a BUN of 20 and creatinine 0.74 just 2 weeks prior to this admission, her bicarb was 17.  She was admitted with acute renal insufficiency and mild hyperkalemia, type 2 diabetes, benign essential  hypertension.  The patient was placed in ICU as overflow type patient.  At the time of admission, she was placed on a sodium-restricted diet, subcutaneous Lovenox was started. She was continued on glipizide 5 mg b.i.d., Bystolic 20 mg daily.  The patient was seen in consultation by Cardiology  during her stay in the ICU and was thought of aggressive therapy for CHF, which had occurred on a prior hospitalization, I called acute renal failure.  Dr. Lattie Haw felt that she did not need Aldactone or ACE inhibitor with normal cardiac function.  She had been scheduled to see Dr. Doylene Canard as an outpatient later.  Chest x-ray was consistent with congestive heart failure.  The patient's serum potassium improved, but on October 07, 2011, she received Kayexalate because of persistent elevation of potassium 5.5.  On October 08, 2011, the patient was again seen by Cardiology.  It was felt that her renal function just about normalized, and x-ray showed resolution of CHF.  Recommendation was that she avoid ACEs and ARBs for a period time and resume diuretic if needed based on weight.  I felt that she could be discharged as she had stabilized.     Winslow Verrill G. Everette Rank, MD     AGM/MEDQ  D:  10/08/2011  T:  10/08/2011  Job:  [id number]

## 2011-10-08 NOTE — Plan of Care (Signed)
Problem: Phase I Progression Outcomes Goal: OOB as tolerated unless otherwise ordered Outcome: Completed/Met Date Met:  10/08/11 Up to [facility] with minimal assistance

## 2011-10-08 NOTE — Care Management Note (Signed)
    Page 1 of 2   10/09/2011     1:14:33 PM   CARE MANAGEMENT NOTE 10/09/2011  Patient:  [name], [name]   Account Number:  [id number]  Date Initiated:  10/08/2011  Documentation initiated by:  Theophilus Kinds  Subjective/Objective Assessment:   Pt admitted from home with renal insufficiency. Pt lives with family and will return home at discharge. Pt is currently active with [facility]. Pt is fairly independent with ADL's.     Action/Plan:   CM will arrange resumption of HH at discharge with [facility]. No other CM needs noted.   Anticipated DC Date:  10/10/2011   Anticipated DC Plan:  [place]  CM consult      [facility] Choice  Resumption Of Svcs/PTA Provider   Choice offered to / List presented to:  C-1 Patient        [place] arranged  HH-1 RN  [place].   Status of service:  Completed, signed off Medicare Important Message given?   (If response is "NO", the following Medicare IM given date fields will be blank) Date Medicare IM given:   Date Additional Medicare IM given:    Discharge Disposition:  [place]  Per UR Regulation:    If discussed at Long Length of Stay Meetings, dates discussed:    Comments:  10/09/11 [place], RN BSN CM Pt discharged home today with [facility] RN. Romualdo Bolk of [facility] is aware and will collect the pts information from the chart. No DME needs noted.Pt and pts nurse aware of discharge arrangements.  10/07/11 [place], RN BSN CM

## 2011-10-08 NOTE — Progress Notes (Signed)
DR MCINNIS CALLED CONDERNING K+ 5.6.   ORDERS RECEIVED.

## 2011-10-08 NOTE — Progress Notes (Signed)
[name], [name]              ACCOUNT NO.:  [id number]  MEDICAL RECORD NO.:  [id number]  LOCATION:  IC06                          FACILITY:  [facility]  PHYSICIAN:  Charline Hoskinson G. Everette Rank, MD   DATE OF BIRTH:  1955-12-11  DATE OF PROCEDURE: DATE OF DISCHARGE:                                PROGRESS NOTE   SUBJECTIVE:  This patient was admitted to the hospital initially because of elevated serum potassium.  She had been on diuretics at home, mainly Lasix.  This was stopped.  She had been recently treated for congestive heart failure in [place].  Has a low ejection fraction 35%.  The patient was given IV glucose and insulin initially.  Her current potassium is 5.1.  Admitting creatinine was 1.82.  OBJECTIVE:  VITAL SIGNS:  Blood pressure 104/64, respirations 18, pulse 59, temp 98.4, and previously was 2.31. LUNGS:  Showed diminished breath sounds. HEART:  Regular rhythm. ABDOMEN:  No palpable organs or masses.  She is slightly obese. EXTREMITIES:  She has minimal edema in her extremities.  ASSESSMENT:  The patient was admitted with mild hyperkalemia, type 2 diabetes, essential hypertension, and evidence of recent congestive heart failure.  She does have elevated creatinine.  She does have iron- deficiency anemia.  Her renal function has improved and the patient could be moved out to regular hospital bed.     Besan Ketchem G. Everette Rank, MD     AGM/MEDQ  D:  10/08/2011  T:  10/08/2011  Job:  [id number]

## 2011-10-08 NOTE — Progress Notes (Signed)
PT TRANSFERRED TO ROOM 324. REPORT CALLED TO ABBY ON 300. PT Startup. IV IN RT HAND PATENT. DENIES ANY PAIN. VSS

## 2011-10-08 NOTE — Progress Notes (Signed)
[name]  56 y.o.  female  Subjective: Denies dyspnea, edema, chest discomfort.  No complaints whatsoever.  Allergy: Review of patient's allergies indicates no known allergies.  Objective: Vital signs in last 24 hours: Temp:  [97.3 F (36.3 C)-98.7 F (37.1 C)] 97.7 F (36.5 C) (07/11 1620) Pulse Rate:  [58-62] 61  (07/11 1620) Resp:  [18-20] 18  (07/11 1620) BP: (104-180)/(64-84) 176/82 mmHg (07/11 1620) SpO2:  [95 %-98 %] 95 % (07/11 1620) Weight:  [113.1 kg (249 lb 5.4 oz)] 113.1 kg (249 lb 5.4 oz) (07/11 0500)  113.1 kg (249 lb 5.4 oz) Body mass index is 40.24 kg/(m^2).  Weight change: -0.6 kg (-1 lb 5.2 oz) Last BM Date: 10/06/11  BP increased to 155-170/<90.  Wt stable at approx 250.  I/O +1L since admit.  Intake/Output from previous day: 07/10 0701 - 07/11 0700 In: 2427.5 [P.O.:1080; I.V.:1347.5] Out: 2625 [Urine:2625]  General- Well developed; no acute distress, moderately overwt Neck- No JVD, bilat carotid bruits Lungs- clear lung fields; normal I:E ratio; decreased BS on right Cardiovascular- normal PMI; normal S1 and S2 Abdomen- normal bowel sounds; soft and non-tender without masses or organomegaly Skin- Warm, no significant lesions Extremities- Nl distal pulses; no edema  Basename 10/07/11 0440  WBC 7.5  HGB 12.4  HCT 38.8  PLT 231   BMET:  Basename 10/08/11 0706 10/08/11 0521  NA 140 140  K 5.6* 5.2*  CL 114* 113*  CO2 20 20  GLUCOSE 132* 123*  BUN 28* 29*  CREATININE 1.25* 1.27*  CALCIUM 9.2 9.3   Imaging Studies/Results: Dg Chest 2 View  10/08/2011  *RADIOLOGY REPORT*  Clinical Data: Congestive heart failure.  CHEST - 2 VIEW  Comparison: 09/19/2011  Findings: There is slight cardiomegaly, less prominent than on the prior study.  The slight interstitial edema has resolved and the pulmonary vascularity is now normal.  No effusions.  No acute osseous abnormality.  IMPRESSION: Resolution of congestive heart failure.  Original Report  Authenticated By: Larey Seat, M.D.   Imaging: Imaging results have been reviewed  Medications: I have reviewed the patient's current medications.  Active Problems:  Iron deficiency anemia  Hypertension  Acute renal failure  Hyperkalemia   Assessment/Plan: Renal function has just about normalized.  CXR verifies resolution of CHF.  DC IV.  Avoid ACE/ARB and resume diuretic as/if needed based on weight.  OK for discharge in AM.  LOS: 2 days   [name] 10/08/2011, 6:17 PM

## 2011-10-09 LAB — BASIC METABOLIC PANEL
BUN: 17 mg/dL (ref 6–23)
CO2: 21 mEq/L (ref 19–32)
Calcium: 9.1 mg/dL (ref 8.4–10.5)
Chloride: 112 mEq/L (ref 96–112)
Creatinine, Ser: 1.02 mg/dL (ref 0.50–1.10)
GFR calc Af Amer: 70 mL/min — ABNORMAL LOW (ref >90)
GFR calc non Af Amer: 61 mL/min — ABNORMAL LOW (ref >90)
Glucose, Bld: 144 mg/dL — ABNORMAL HIGH (ref 70–99)
Potassium: 4.6 mEq/L (ref 3.5–5.1)
Sodium: 140 mEq/L (ref 135–145)

## 2011-10-09 NOTE — Progress Notes (Signed)
UR chart review completed.  

## 2011-10-09 NOTE — Progress Notes (Signed)
Discharge Summary: a/o.vss. Up ad lib. Saline lock removed. Discharge instructions given. Pt verbalized understanding of instructions. Awaiting for family to arrive for discharge.

## 2011-10-09 NOTE — Discharge Summary (Signed)
[name], [name]              ACCOUNT NO.:  [id number]  MEDICAL RECORD NO.:  [id number]  LOCATION:  [id number]                          FACILITY:  [facility]  PHYSICIAN:  Jenefer Woerner G. Everette Rank, MD   DATE OF BIRTH:  [birth year]  DATE OF ADMISSION:  10/06/2011 DATE OF DISCHARGE:  LH                              DISCHARGE SUMMARY   ADDENDUM  The patient was sent home on the following medications: 1. Bystolic 20 mg daily. 2. Digoxin 0.125 mg daily. 3. Furosemide 40 mg daily. 4. Glipizide 5 mg 2 times daily. 5. Norco 5/325 one every 4 hours as needed for pain. 6. Isosorbide hydralazine 20/37.5 t.i.d. 7. Lisinopril 20 mg daily.  The patient was stable at the time of her discharge.     Kaliel Bolds G. Everette Rank, MD     AGM/MEDQ  D:  10/09/2011  T:  10/09/2011  Job:  [id number]

## 2012-01-01 ENCOUNTER — Other Ambulatory Visit (HOSPITAL_COMMUNITY): Payer: Medicare PPO

## 2012-01-05 ENCOUNTER — Encounter (HOSPITAL_COMMUNITY): Payer: Self-pay | Admitting: Oncology

## 2012-01-05 ENCOUNTER — Ambulatory Visit (HOSPITAL_COMMUNITY): Payer: Medicare PPO | Admitting: Oncology

## 2012-03-21 ENCOUNTER — Other Ambulatory Visit (HOSPITAL_COMMUNITY): Payer: Self-pay | Admitting: Family Medicine

## 2012-03-21 DIAGNOSIS — Z139 Encounter for screening, unspecified: Secondary | ICD-10-CM

## 2012-03-24 ENCOUNTER — Ambulatory Visit (HOSPITAL_COMMUNITY)
Admission: RE | Admit: 2012-03-24 | Discharge: 2012-03-24 | Disposition: A | Discharge status: Home / Self Care | Payer: Medicare HMO | Source: Ambulatory Visit | Attending: Family Medicine | Admitting: Family Medicine

## 2012-03-24 DIAGNOSIS — Z139 Encounter for screening, unspecified: Secondary | ICD-10-CM

## 2012-03-24 DIAGNOSIS — Z1231 Encounter for screening mammogram for malignant neoplasm of breast: Secondary | ICD-10-CM | POA: Insufficient documentation

## 2013-02-01 ENCOUNTER — Inpatient Hospital Stay (HOSPITAL_COMMUNITY)
Admission: EM | Admit: 2013-02-01 | Discharge: 2013-02-06 | DRG: 637 | Disposition: A | Discharge status: Home / Self Care | Payer: Medicare Other | Attending: Family Medicine | Admitting: Family Medicine

## 2013-02-01 ENCOUNTER — Encounter (HOSPITAL_COMMUNITY): Payer: Self-pay | Admitting: Emergency Medicine

## 2013-02-01 DIAGNOSIS — N182 Chronic kidney disease, stage 2 (mild): Secondary | ICD-10-CM | POA: Diagnosis present

## 2013-02-01 DIAGNOSIS — Z833 Family history of diabetes mellitus: Secondary | ICD-10-CM

## 2013-02-01 DIAGNOSIS — I10 Essential (primary) hypertension: Secondary | ICD-10-CM

## 2013-02-01 DIAGNOSIS — I5033 Acute on chronic diastolic (congestive) heart failure: Secondary | ICD-10-CM | POA: Diagnosis present

## 2013-02-01 DIAGNOSIS — E860 Dehydration: Secondary | ICD-10-CM

## 2013-02-01 DIAGNOSIS — E119 Type 2 diabetes mellitus without complications: Secondary | ICD-10-CM

## 2013-02-01 DIAGNOSIS — Z23 Encounter for immunization: Secondary | ICD-10-CM

## 2013-02-01 DIAGNOSIS — N179 Acute kidney failure, unspecified: Secondary | ICD-10-CM | POA: Diagnosis present

## 2013-02-01 DIAGNOSIS — R9431 Abnormal electrocardiogram [ECG] [EKG]: Secondary | ICD-10-CM | POA: Diagnosis present

## 2013-02-01 DIAGNOSIS — D509 Iron deficiency anemia, unspecified: Secondary | ICD-10-CM | POA: Diagnosis present

## 2013-02-01 DIAGNOSIS — I509 Heart failure, unspecified: Secondary | ICD-10-CM | POA: Diagnosis present

## 2013-02-01 DIAGNOSIS — I129 Hypertensive chronic kidney disease with stage 1 through stage 4 chronic kidney disease, or unspecified chronic kidney disease: Secondary | ICD-10-CM | POA: Diagnosis present

## 2013-02-01 DIAGNOSIS — E875 Hyperkalemia: Secondary | ICD-10-CM | POA: Diagnosis present

## 2013-02-01 DIAGNOSIS — E871 Hypo-osmolality and hyponatremia: Secondary | ICD-10-CM | POA: Diagnosis present

## 2013-02-01 DIAGNOSIS — Z6836 Body mass index (BMI) 36.0-36.9, adult: Secondary | ICD-10-CM

## 2013-02-01 DIAGNOSIS — R739 Hyperglycemia, unspecified: Secondary | ICD-10-CM

## 2013-02-01 DIAGNOSIS — E669 Obesity, unspecified: Secondary | ICD-10-CM | POA: Diagnosis present

## 2013-02-01 DIAGNOSIS — Z794 Long term (current) use of insulin: Secondary | ICD-10-CM

## 2013-02-01 DIAGNOSIS — IMO0001 Reserved for inherently not codable concepts without codable children: Principal | ICD-10-CM | POA: Diagnosis present

## 2013-02-01 DIAGNOSIS — Z87891 Personal history of nicotine dependence: Secondary | ICD-10-CM

## 2013-02-01 LAB — GLUCOSE, CAPILLARY
Glucose-Capillary: 552 mg/dL (ref 70–99)
Glucose-Capillary: 349 mg/dL — ABNORMAL HIGH (ref 70–99)
Glucose-Capillary: 245 mg/dL — ABNORMAL HIGH (ref 70–99)
Glucose-Capillary: 239 mg/dL — ABNORMAL HIGH (ref 70–99)
Glucose-Capillary: 302 mg/dL — ABNORMAL HIGH (ref 70–99)
Glucose-Capillary: 329 mg/dL — ABNORMAL HIGH (ref 70–99)
Glucose-Capillary: 313 mg/dL — ABNORMAL HIGH (ref 70–99)
Glucose-Capillary: 233 mg/dL — ABNORMAL HIGH (ref 70–99)
Glucose-Capillary: 177 mg/dL — ABNORMAL HIGH (ref 70–99)
Glucose-Capillary: 158 mg/dL — ABNORMAL HIGH (ref 70–99)
Glucose-Capillary: 134 mg/dL — ABNORMAL HIGH (ref 70–99)

## 2013-02-01 LAB — BASIC METABOLIC PANEL
BUN: 91 mg/dL — ABNORMAL HIGH (ref 6–23)
CO2: 22 mEq/L (ref 19–32)
Calcium: 10 mg/dL (ref 8.4–10.5)
Chloride: 87 mEq/L — ABNORMAL LOW (ref 96–112)
Creatinine, Ser: 2.58 mg/dL — ABNORMAL HIGH (ref 0.50–1.10)
GFR calc Af Amer: 23 mL/min — ABNORMAL LOW (ref >90)
GFR calc non Af Amer: 19 mL/min — ABNORMAL LOW (ref >90)
Glucose, Bld: 565 mg/dL (ref 70–99)
Potassium: 4.8 mEq/L (ref 3.5–5.1)
Sodium: 124 mEq/L — ABNORMAL LOW (ref 135–145)

## 2013-02-01 LAB — URINALYSIS, ROUTINE W REFLEX MICROSCOPIC
Bilirubin Urine: NEGATIVE
Glucose, UA: 500 mg/dL — AB
Hgb urine dipstick: NEGATIVE
Ketones, ur: NEGATIVE mg/dL
Nitrite: NEGATIVE
Protein, ur: NEGATIVE mg/dL
Specific Gravity, Urine: 1.02 (ref 1.005–1.030)
Urobilinogen, UA: 0.2 mg/dL (ref 0.0–1.0)
pH: 5.5 (ref 5.0–8.0)

## 2013-02-01 LAB — DIGOXIN LEVEL: Digoxin Level: 2.1 ng/mL — ABNORMAL HIGH (ref 0.8–2.0)

## 2013-02-01 LAB — CBC
HCT: 37.9 % (ref 36.0–46.0)
Hemoglobin: 12.9 g/dL (ref 12.0–15.0)
MCH: 30.2 pg (ref 26.0–34.0)
MCHC: 34 g/dL (ref 30.0–36.0)
MCV: 88.8 fL (ref 78.0–100.0)
Platelets: 372 10*3/uL (ref 150–400)
RBC: 4.27 MIL/uL (ref 3.87–5.11)
RDW: 13.7 % (ref 11.5–15.5)
WBC: 7.4 10*3/uL (ref 4.0–10.5)

## 2013-02-01 LAB — URINE MICROSCOPIC-ADD ON

## 2013-02-01 LAB — MRSA PCR SCREENING: MRSA by PCR: NEGATIVE

## 2013-02-01 MED ORDER — ISOSORB DINITRATE-HYDRALAZINE 20-37.5 MG PO TABS
1.0000 | ORAL_TABLET | Freq: Three times a day (TID) | ORAL | Status: DC
  Administered 2013-02-01 – 2013-02-06 (×14): 1 via ORAL
  Filled 2013-02-01 (×15): qty 1

## 2013-02-01 MED ORDER — SODIUM CHLORIDE 0.9 % IV SOLN
Freq: Once | INTRAVENOUS | Status: AC
  Administered 2013-02-01: 13:00:00 via INTRAVENOUS

## 2013-02-01 MED ORDER — ISOSORB DINITRATE-HYDRALAZINE 20-37.5 MG PO TABS
ORAL_TABLET | ORAL | Status: AC
  Filled 2013-02-01: qty 1

## 2013-02-01 MED ORDER — SODIUM CHLORIDE 0.9 % IV SOLN
INTRAVENOUS | Status: DC
  Administered 2013-02-01: 2.9 [IU]/h via INTRAVENOUS
  Filled 2013-02-01: qty 1

## 2013-02-01 MED ORDER — SODIUM CHLORIDE 0.9 % IV BOLUS (SEPSIS)
1000.0000 mL | Freq: Once | INTRAVENOUS | Status: AC
  Administered 2013-02-01: 1000 mL via INTRAVENOUS

## 2013-02-01 MED ORDER — FUROSEMIDE 40 MG PO TABS
40.0000 mg | ORAL_TABLET | Freq: Every day | ORAL | Status: DC
  Administered 2013-02-01 – 2013-02-06 (×6): 40 mg via ORAL
  Filled 2013-02-01 (×6): qty 1

## 2013-02-01 MED ORDER — ENOXAPARIN SODIUM 30 MG/0.3ML ~~LOC~~ SOLN
30.0000 mg | SUBCUTANEOUS | Status: DC
  Administered 2013-02-01: 30 mg via SUBCUTANEOUS
  Filled 2013-02-01: qty 0.3

## 2013-02-01 MED ORDER — LISINOPRIL 10 MG PO TABS
20.0000 mg | ORAL_TABLET | Freq: Every day | ORAL | Status: DC
  Administered 2013-02-01 – 2013-02-06 (×6): 20 mg via ORAL
  Filled 2013-02-01 (×6): qty 2

## 2013-02-01 MED ORDER — INSULIN GLARGINE 100 UNIT/ML ~~LOC~~ SOLN
30.0000 [IU] | Freq: Every day | SUBCUTANEOUS | Status: DC
  Administered 2013-02-02 (×2): 30 [IU] via SUBCUTANEOUS
  Filled 2013-02-01 (×3): qty 0.3

## 2013-02-01 MED ORDER — SODIUM CHLORIDE 0.9 % IV SOLN
1.0000 g | Freq: Once | INTRAVENOUS | Status: AC
  Administered 2013-02-01: 1 g via INTRAVENOUS
  Filled 2013-02-01: qty 10

## 2013-02-01 MED ORDER — INSULIN ASPART 100 UNIT/ML ~~LOC~~ SOLN
10.0000 [IU] | Freq: Once | SUBCUTANEOUS | Status: AC
  Administered 2013-02-01: 10 [IU] via INTRAVENOUS
  Filled 2013-02-01: qty 1

## 2013-02-01 MED ORDER — ACETAMINOPHEN 325 MG PO TABS
650.0000 mg | ORAL_TABLET | ORAL | Status: DC | PRN
Start: 2013-02-01 — End: 2013-02-06
  Administered 2013-02-01 – 2013-02-05 (×2): 650 mg via ORAL
  Filled 2013-02-01 (×2): qty 2

## 2013-02-01 MED ORDER — SODIUM CHLORIDE 0.9 % IV SOLN: INTRAVENOUS | Status: DC

## 2013-02-01 MED ORDER — INFLUENZA VAC SPLIT QUAD 0.5 ML IM SUSP
0.5000 mL | INTRAMUSCULAR | Status: AC
  Administered 2013-02-02: 0.5 mL via INTRAMUSCULAR
  Filled 2013-02-01: qty 0.5

## 2013-02-01 MED ORDER — DEXTROSE-NACL 5-0.45 % IV SOLN
INTRAVENOUS | Status: DC
  Administered 2013-02-01: 15:00:00 via INTRAVENOUS

## 2013-02-01 MED ORDER — LINAGLIPTIN 5 MG PO TABS
5.0000 mg | ORAL_TABLET | Freq: Every day | ORAL | Status: DC
  Administered 2013-02-01 – 2013-02-02 (×2): 5 mg via ORAL
  Filled 2013-02-01 (×2): qty 1

## 2013-02-01 MED ORDER — DIGOXIN 125 MCG PO TABS
0.1250 mg | ORAL_TABLET | Freq: Every day | ORAL | Status: DC
  Administered 2013-02-01 – 2013-02-06 (×6): 0.125 mg via ORAL
  Filled 2013-02-01 (×6): qty 1

## 2013-02-01 NOTE — Progress Notes (Signed)
Dr. Karie Kirks called to inform him that patient was present on floor. Orders given to change to SSI sensitive scale with no HS coverage when patient is in target range x4hrs per protocol. Order also given to administer Lantus 30 units SQ 2hrs before d/cing insulin gtt, and then HS after that.

## 2013-02-01 NOTE — ED Provider Notes (Signed)
CSN: [id number]     Arrival date & time 02/01/13  1007 History  This chart was scribed for [name] B. Karle Starch, MD by Roxan Diesel, ED scribe.  This patient was seen in room APA18/APA18 and the patient's care was started at 11:27 AM.   Chief Complaint  Patient presents with  . Hyperglycemia  . Weakness    The history is provided by the patient. No language interpreter was used.    PCP: Lanette Hampshire, MD  HPI Comments: [name] is a 57 y.o. female who presents to the Emergency Department complaining of hyperglycemia, weakness, and abnormal lab result at PCP's office.  Pt  states that for the past week she has had worsening generalized weakness, fatigue, and generalized body aches.  She also complains of occasional hot spells.  She denies vomiting, diarrhea, or abdominal pain.  Pt was seen by her PCP yesterday and had lab work done which revealed elevated blood sugar, elevated potassium, and impaired kidney function.  She was diagnosed with DM "close to a year ago."  She states she has been taking all her medications as instructed and denies recent missed doses.  She denies h/o kidney issues to her knowledge.    Past Medical History  Diagnosis Date  . Hypertension   . Iron deficiency anemia 10/16/2010  . Mental handicap 10/16/2010  . Diabetes mellitus without complication   . CHF (congestive heart failure)     Past Surgical History  Procedure Laterality Date  . Cholecystectomy    . Abdominal hysterectomy    . Knee surgery      right knee @ 57 years of age  . Cesarean section    . Colonoscopy  08/2010    normal TI, sigmoid polyp (adenoma ). Next TCS due  08/2015,  . Esophagogastroduodenoscopy  08/2010    antral and duodenal erosions s/p bx (chronic gastritis, no h.pylori, no celiac dz ), hiatal hernia    Family History  Problem Relation Age of Onset  . Colon cancer Neg Hx   . Kidney disease      son reportedly has had cyst on kidney and lung s/p surgery at [facility]  .  Diabetes Mother     History  Substance Use Topics  . Smoking status: Former Smoker -- 1.00 packs/day for 0 years    Types: Cigarettes  . Smokeless tobacco: Never Used     Comment: quit a couple year ago  . Alcohol Use: No    OB History   Grav Para Term Preterm Abortions TAB SAB Ect Mult Living                  Review of Systems A complete 10 system review of systems was obtained and all systems are negative except as noted in the HPI and PMH.    Allergies  Review of patient's allergies indicates no known allergies.  Home Medications   Current Outpatient Rx  Name  Route  Sig  Dispense  Refill  . EXPIRED: digoxin (LANOXIN) 0.125 MG tablet   Oral   Take 1 tablet (125 mcg total) by mouth daily.   30 tablet   1   . EXPIRED: furosemide (LASIX) 40 MG tablet   Oral   Take 1 tablet (40 mg total) by mouth daily.   30 tablet   1   . EXPIRED: glipiZIDE (GLUCOTROL) 5 MG tablet   Oral   Take 1 tablet (5 mg total) by mouth 2 (two) times daily before a  meal.   60 tablet   1   . HYDROcodone-acetaminophen (NORCO) 5-325 MG per tablet   Oral   Take 1 tablet by mouth every 4 (four) hours as needed.         Marland Kitchen EXPIRED: lisinopril (PRINIVIL,ZESTRIL) 20 MG tablet   Oral   Take 1 tablet (20 mg total) by mouth daily.   30 tablet   1   . Nebivolol HCl (BYSTOLIC) 20 MG TABS   Oral   Take 20 mg by mouth daily.          BP 115/63  Pulse 98  Temp(Src) 96.1 F (35.6 C) (Oral)  Resp 26  Ht 5\' 2"  (1.575 m)  Wt 197 lb (89.359 kg)  BMI 36.02 kg/m2  SpO2 95%  Physical Exam  Nursing note and vitals reviewed. Constitutional: She is oriented to person, place, and time. She appears well-developed and well-nourished.  HENT:  Head: Normocephalic and atraumatic.  Mouth/Throat: Mucous membranes are dry.  Eyes: EOM are normal. Pupils are equal, round, and reactive to light.  Neck: Normal range of motion. Neck supple.  Cardiovascular: Normal rate, normal heart sounds and intact  distal pulses.   Pulmonary/Chest: Effort normal and breath sounds normal.  Abdominal: Bowel sounds are normal. She exhibits no distension. There is no tenderness.  Musculoskeletal: Normal range of motion. She exhibits no edema and no tenderness.  Neurological: She is alert and oriented to person, place, and time. She has normal strength. No cranial nerve deficit or sensory deficit.  Skin: Skin is warm and dry. No rash noted.  Psychiatric: She has a normal mood and affect.    ED Course  Procedures (including critical care time)  DIAGNOSTIC STUDIES: Oxygen Saturation is 95% on room air, adequate by my interpretation.    COORDINATION OF CARE: 11:32 AM-Discussed treatment plan which includes insulin injection, EKG, IV fluids and labs with pt at bedside and pt agreed to plan.    Labs Review Labs Reviewed  GLUCOSE, CAPILLARY - Abnormal; Notable for the following:    Glucose-Capillary 552 (*)    All other components within normal limits  BASIC METABOLIC PANEL - Abnormal; Notable for the following:    Sodium 124 (*)    Chloride 87 (*)    Glucose, Bld 565 (*)    BUN 91 (*)    Creatinine, Ser 2.58 (*)    GFR calc non Af Amer 19 (*)    GFR calc Af Amer 23 (*)    All other components within normal limits  URINALYSIS, ROUTINE W REFLEX MICROSCOPIC - Abnormal; Notable for the following:    Glucose, UA 500 (*)    Leukocytes, UA TRACE (*)    All other components within normal limits  CBC  URINE MICROSCOPIC-ADD ON  DIGOXIN LEVEL   Imaging Review No results found.  EKG Interpretation     Ventricular Rate:  81 PR Interval:  186 QRS Duration: 104 QT Interval:  326 QTC Calculation: 378 R Axis:   36 Text Interpretation:  Normal sinus rhythm Possible Anterior infarct , age undetermined ST \\T \ T wave abnormality, consider inferolateral ischemia Abnormal ECG When compared with ECG of 06-Oct-2011 18:11, T wave inversion now evident in Inferior leads T wave inversion now evident in Lateral  leads            MDM   1. Hyperglycemia   2. Dehydration   3. Acute renal failure     Labs reviewed, hyperglycemia with AFR but no hyperkalemia. Given insulin  IV and CaGlu initially due to reported high K yesterday but no acute EKG changes. Discussed with Dr. Karie Kirks who will admit.    I personally performed the services described in this documentation, which was scribed in my presence. The recorded information has been reviewed and is accurate.      Judie Hollick B. Karle Starch, MD 02/01/13 1240

## 2013-02-01 NOTE — ED Notes (Signed)
Pt reports feeling weak and tired (more than usual) for several days, her bs has been over 600 for 2 days.   Went to her pmd and had lab work yesterday sent here today for further eval.

## 2013-02-01 NOTE — ED Notes (Signed)
Assisted to and from [facility].

## 2013-02-02 DIAGNOSIS — R9431 Abnormal electrocardiogram [ECG] [EKG]: Secondary | ICD-10-CM

## 2013-02-02 DIAGNOSIS — N179 Acute kidney failure, unspecified: Secondary | ICD-10-CM

## 2013-02-02 DIAGNOSIS — I10 Essential (primary) hypertension: Secondary | ICD-10-CM

## 2013-02-02 LAB — GLUCOSE, CAPILLARY
Glucose-Capillary: 108 mg/dL — ABNORMAL HIGH (ref 70–99)
Glucose-Capillary: 149 mg/dL — ABNORMAL HIGH (ref 70–99)
Glucose-Capillary: 158 mg/dL — ABNORMAL HIGH (ref 70–99)
Glucose-Capillary: 160 mg/dL — ABNORMAL HIGH (ref 70–99)
Glucose-Capillary: 205 mg/dL — ABNORMAL HIGH (ref 70–99)
Glucose-Capillary: 321 mg/dL — ABNORMAL HIGH (ref 70–99)
Glucose-Capillary: 334 mg/dL — ABNORMAL HIGH (ref 70–99)
Glucose-Capillary: 335 mg/dL — ABNORMAL HIGH (ref 70–99)
Glucose-Capillary: 346 mg/dL — ABNORMAL HIGH (ref 70–99)
Glucose-Capillary: 355 mg/dL — ABNORMAL HIGH (ref 70–99)
Glucose-Capillary: 392 mg/dL — ABNORMAL HIGH (ref 70–99)
Glucose-Capillary: 465 mg/dL — ABNORMAL HIGH (ref 70–99)
Glucose-Capillary: 480 mg/dL — ABNORMAL HIGH (ref 70–99)
Glucose-Capillary: 533 mg/dL — ABNORMAL HIGH (ref 70–99)

## 2013-02-02 LAB — BASIC METABOLIC PANEL
BUN: 72 mg/dL — ABNORMAL HIGH (ref 6–23)
BUN: 61 mg/dL — ABNORMAL HIGH (ref 6–23)
BUN: 59 mg/dL — ABNORMAL HIGH (ref 6–23)
CO2: 22 mEq/L (ref 19–32)
CO2: 24 mEq/L (ref 19–32)
CO2: 24 mEq/L (ref 19–32)
Calcium: 9.1 mg/dL (ref 8.4–10.5)
Calcium: 9.3 mg/dL (ref 8.4–10.5)
Calcium: 9.1 mg/dL (ref 8.4–10.5)
Chloride: 101 mEq/L (ref 96–112)
Chloride: 96 mEq/L (ref 96–112)
Chloride: 98 mEq/L (ref 96–112)
Creatinine, Ser: 2.08 mg/dL — ABNORMAL HIGH (ref 0.50–1.10)
Creatinine, Ser: 2.14 mg/dL — ABNORMAL HIGH (ref 0.50–1.10)
Creatinine, Ser: 2.38 mg/dL — ABNORMAL HIGH (ref 0.50–1.10)
GFR calc Af Amer: 29 mL/min — ABNORMAL LOW (ref >90)
GFR calc Af Amer: 28 mL/min — ABNORMAL LOW (ref >90)
GFR calc Af Amer: 25 mL/min — ABNORMAL LOW (ref >90)
GFR calc non Af Amer: 25 mL/min — ABNORMAL LOW (ref >90)
GFR calc non Af Amer: 24 mL/min — ABNORMAL LOW (ref >90)
GFR calc non Af Amer: 21 mL/min — ABNORMAL LOW (ref >90)
Glucose, Bld: 249 mg/dL — ABNORMAL HIGH (ref 70–99)
Glucose, Bld: 364 mg/dL — ABNORMAL HIGH (ref 70–99)
Glucose, Bld: 335 mg/dL — ABNORMAL HIGH (ref 70–99)
Potassium: 4.8 mEq/L (ref 3.5–5.1)
Potassium: 4.7 mEq/L (ref 3.5–5.1)
Potassium: 4.5 mEq/L (ref 3.5–5.1)
Sodium: 133 mEq/L — ABNORMAL LOW (ref 135–145)
Sodium: 131 mEq/L — ABNORMAL LOW (ref 135–145)
Sodium: 133 mEq/L — ABNORMAL LOW (ref 135–145)

## 2013-02-02 LAB — DIGOXIN LEVEL: Digoxin Level: 1.8 ng/mL (ref 0.8–2.0)

## 2013-02-02 LAB — GLUCOSE, RANDOM: Glucose, Bld: 531 mg/dL — ABNORMAL HIGH (ref 70–99)

## 2013-02-02 LAB — CBC
HCT: 32.7 % — ABNORMAL LOW (ref 36.0–46.0)
Hemoglobin: 11 g/dL — ABNORMAL LOW (ref 12.0–15.0)
MCH: 30 pg (ref 26.0–34.0)
MCHC: 33.6 g/dL (ref 30.0–36.0)
MCV: 89.1 fL (ref 78.0–100.0)
Platelets: 254 10*3/uL (ref 150–400)
RBC: 3.67 MIL/uL — ABNORMAL LOW (ref 3.87–5.11)
RDW: 13.7 % (ref 11.5–15.5)
WBC: 8.4 10*3/uL (ref 4.0–10.5)

## 2013-02-02 MED ORDER — INSULIN GLARGINE 100 UNIT/ML ~~LOC~~ SOLN
30.0000 [IU] | Freq: Every day | SUBCUTANEOUS | Status: DC
  Filled 2013-02-02: qty 0.3

## 2013-02-02 MED ORDER — DEXTROSE-NACL 5-0.45 % IV SOLN
INTRAVENOUS | Status: AC
  Administered 2013-02-02: 23:00:00 via INTRAVENOUS

## 2013-02-02 MED ORDER — SODIUM CHLORIDE 0.45 % IV SOLN
INTRAVENOUS | Status: DC
  Administered 2013-02-02: 22:00:00 via INTRAVENOUS

## 2013-02-02 MED ORDER — SODIUM CHLORIDE 0.9 % IV SOLN
INTRAVENOUS | Status: AC
  Administered 2013-02-02: 5.5 [IU]/h via INTRAVENOUS
  Administered 2013-02-02: 3 [IU]/h via INTRAVENOUS
  Administered 2013-02-03: 4.2 [IU]/h via INTRAVENOUS
  Filled 2013-02-02: qty 1

## 2013-02-02 MED ORDER — INSULIN ASPART 100 UNIT/ML ~~LOC~~ SOLN
20.0000 [IU] | Freq: Once | SUBCUTANEOUS | Status: AC
  Administered 2013-02-02: 20 [IU] via SUBCUTANEOUS

## 2013-02-02 MED ORDER — DEXTROSE 50 % IV SOLN: 25.0000 mL | INTRAVENOUS | Status: DC | PRN

## 2013-02-02 MED ORDER — INSULIN ASPART 100 UNIT/ML ~~LOC~~ SOLN
0.0000 [IU] | Freq: Three times a day (TID) | SUBCUTANEOUS | Status: DC
  Administered 2013-02-02: 7 [IU] via SUBCUTANEOUS

## 2013-02-02 MED ORDER — INSULIN REGULAR BOLUS VIA INFUSION
0.0000 [IU] | Freq: Three times a day (TID) | INTRAVENOUS | Status: DC
  Administered 2013-02-02: 6 [IU] via INTRAVENOUS
  Filled 2013-02-02: qty 10

## 2013-02-02 MED ORDER — SODIUM CHLORIDE 0.45 % IV SOLN
INTRAVENOUS | Status: DC
  Administered 2013-02-02: 13:00:00 via INTRAVENOUS

## 2013-02-02 MED ORDER — ENOXAPARIN SODIUM 40 MG/0.4ML ~~LOC~~ SOLN
40.0000 mg | SUBCUTANEOUS | Status: DC
  Administered 2013-02-02: 40 mg via SUBCUTANEOUS
  Filled 2013-02-02: qty 0.4

## 2013-02-02 NOTE — Progress Notes (Signed)
Report called to [facility] on department 300.  No acute distress noted.  Patient doing well and ambulating to the bathroom without assistance.  Patient ready for transfer to room 312.

## 2013-02-02 NOTE — Progress Notes (Signed)
Inpatient Diabetes Program Recommendations  AACE/ADA: New Consensus Statement on Inpatient Glycemic Control (2013)  Target Ranges:  Prepandial:   less than 140 mg/dL      Peak postprandial:   less than 180 mg/dL (1-2 hours)      Critically ill patients:  140 - 180 mg/dL   Results for [name], [name] (MRN [id number]) as of 02/02/2013 16:14  Ref. Range 02/02/2013 01:55 02/02/2013 08:08 02/02/2013 11:10 02/02/2013 14:03 02/02/2013 15:00  Glucose-Capillary Latest Range: 70-99 mg/dL 158 (H) 321 (H) 480 (H) 533 (H) 465 (H)   Inpatient Diabetes Program Recommendations Insulin - IV drip/GlucoStabilizer: Recommend patient transfer to ICU as stepdown to be placed on insulin drip via Glucostabilizer. Referral: Consult Dr. Dorris Fetch for diabetes managment.  Note: Patient was transitioned from IV insulin to SQ insulin this morning; patient received Levemir 30 units at 00:01 and insulin drip stopped at 2:00 am.  Fasting glucose noted to be 321 mg/dl at 8:08 and patient received Novolog 7 units for correction.  Blood glucose up to 480 mg/dl at 11:10 and patient received Novolog 20 units for correction.  Patient received Levemir 30 units at 9:54 am.  Blood glucose noted to be 465 mg/dl at 15:00.  Called Dr. Everette Rank to discuss glycemic control.  Per Dr. Everette Rank, patient is to be transferred to ICU as Stepdown to be placed back on an insulin drip via Glucostabilizer and Dr. Dorris Fetch will be consulted to manage diabetes.  Linna Hoff, RN to inform of changes and to request she notify Dr. Dorris Fetch of consult.    Thanks, Barnie Alderman, RN, MSN, CCRN Diabetes Coordinator Inpatient Diabetes Program [phone] (Team Pager) [phone] ([facility] office) [phone] [facility] office)

## 2013-02-02 NOTE — Progress Notes (Signed)
Pt transferring to ICU, report has been called to [facility], RN. Pt in NAD.

## 2013-02-02 NOTE — Consult Note (Signed)
The patient was seen and examined, and I agree with the assessment and plan as documented above, with modifications as noted below. Pt denies chest pain and had unremarkable cardiac catheterization shortly over a year ago. ECG findings may be related to electrolyte abnormalities. Would monitor clinically but I do not recommend any noninvasive studies at this time, with respect to stress testing. Would check serial ECG's as deemed necessary. Patients with diabetes can have microvascular ischemia (with no epicardial coronary artery stenosis) which can sometimes lead to the aforementioned ECG findings. With respect to the 2014 Guidelines, I would recommend statin therapy if there are no contraindications.

## 2013-02-02 NOTE — Consult Note (Signed)
CARDIOLOGY CONSULT NOTE   Patient ID: [name] MRN: [id number] DOB/AGE: Dec 26, 1955 57 y.o.  Admit Date: 02/01/2013 Referring Physician: Marjean Donna MD Primary Physician: Lanette Hampshire, MD Consulting Cardiologist: Kate Sable MD Primary Cardiologist: Doylene Canard Reason for Consultation: Abnormal EKG  Clinical Summary [name] is a 57 y.o.female admitted with hyperglycemia with blood glucose level of 565, impaired renal function creatinine of 2.58. She apparently was seen in PCP office with complaints of generalized fatigue and weakness with body aches. EKG shows new T-wave inversions, inferior and lateral. We are asked for recommendations. Most recent cath in 2013 demonstrated minimal disease in the CX and branches only.    She states she was being treated for cough and congestion by Dr. Everette Rank with antibiotics. She has not been seen by cardiologist since cath in 2013. She denies medical or dietary non-compliance. She is a difficult historian. Hx of hypertension, diastolic CHF, iron deficiency anemia.    On arrival to ER BP 115/63 HR 98, Temp 96.1. Na+ 124, Glucose 565, Creatinine 2.58. CBC was unremarkable.She was treated with NS, calcium gluconate, and insulin. She denied NVD, chest pain or dyspnea.  She is currently being transferred to telemetry.        No Known Allergies  Medications Scheduled Medications: . digoxin  0.125 mg Oral Daily  . enoxaparin (LOVENOX) injection  30 mg Subcutaneous Q24H  . furosemide  40 mg Oral Daily  . influenza vac split quadrivalent PF  0.5 mL Intramuscular Tomorrow-1000  . insulin aspart  0-9 Units Subcutaneous TID WC  . insulin glargine  30 Units Subcutaneous Daily  . insulin glargine  30 Units Subcutaneous QHS  . isosorbide-hydrALAZINE  1 tablet Oral TID  . linagliptin  5 mg Oral Daily  . lisinopril  20 mg Oral Daily     Infusions: . dextrose 5 % and 0.45% NaCl 75 mL/hr at 02/01/13 1452     PRN  Medications:  acetaminophen   Past Medical History  Diagnosis Date  . Hypertension   . Iron deficiency anemia 10/16/2010  . Mental handicap 10/16/2010  . Diabetes mellitus without complication   . CHF (congestive heart failure)     Past Surgical History  Procedure Laterality Date  . Cholecystectomy    . Abdominal hysterectomy    . Knee surgery      right knee @ 57 years of age  . Cesarean section    . Colonoscopy  08/2010    normal TI, sigmoid polyp (adenoma ). Next TCS due  08/2015,  . Esophagogastroduodenoscopy  08/2010    antral and duodenal erosions s/p bx (chronic gastritis, no h.pylori, no celiac dz ), hiatal hernia    Family History  Problem Relation Age of Onset  . Colon cancer Neg Hx   . Kidney disease      son reportedly has had cyst on kidney and lung s/p surgery at [facility]  . Diabetes Mother     Social History [name] reports that she has quit smoking. Her smoking use included Cigarettes. She smoked 1.00 pack per day for 0 years. She has never used smokeless tobacco. [name] reports that she does not drink alcohol.  Review of Systems Otherwise reviewed and negative except as outlined.  Physical Examination Blood pressure 95/45, pulse 83, temperature 98 F (36.7 C), temperature source Oral, resp. rate 13, height 5\' 3"  (1.6 m), weight 201 lb 11.5 oz (91.5 kg), SpO2 98.00%.  Intake/Output Summary (Last 24 hours) at 02/02/13 0848 Last data filed  at 02/02/13 0728  Gross per 24 hour  Intake    675 ml  Output   1975 ml  Net  -1300 ml    Telemetry: NSR  HEENT: Conjunctiva and lids normal, oropharynx clear with moist mucosa. Neck: Supple, no elevated JVP or carotid bruits, no thyromegaly. Lungs: Mild crackles in the bases. No coughing or wheezes.  Cardiac: Regular rate and rhythm, no S3 or significant systolic murmur, no pericardial rub. Abdomen: Soft, nontender, no hepatomegaly, bowel sounds present, no guarding or rebound. Extremities: No pitting edema,  distal pulses 2+. Skin: Warm and dry. Musculoskeletal: No kyphosis. Neuropsychiatric: Alert and oriented x3, affect grossly appropriate.  Prior Cardiac Testing/Procedures 1. Echocardiogram: 09/21/2011 Left ventricle: The cavity size was normal. There was mild concentric hypertrophy. Systolic function was normal. The estimated ejection fraction was in the range of 50% to 55%. There is mild hypokinesis of the basal-midanteroseptal myocardium.   2. Cardiac Cath 09/21/2011 IMPRESSION OF HEART CATHETERIZATION:  1. Normal left main coronary artery. 2. Minimal disease of left anterior descending artery and its branches. 3. Mild disease of left circumflex artery and its branches. 4. Normal right coronary artery. 5. Modeate left ventricular systolic dysfunction. LVEDP 20 mmHg. Ejection fraction 35%. 6. Mild pulmonary hypertension   Lab Results  Basic Metabolic Panel:  Recent Labs Lab 02/01/13 1107 02/02/13 0500  NA 124* 133*  K 4.8 4.8  CL 87* 101  CO2 22 22  GLUCOSE 565* 249*  BUN 91* 72*  CREATININE 2.58* 2.08*  CALCIUM 10.0 9.1    CBC:  Recent Labs Lab 02/01/13 1107 02/02/13 0500  WBC 7.4 8.4  HGB 12.9 11.0*  HCT 37.9 32.7*  MCV 88.8 89.1  PLT 372 254    Cardiac Enzymes: Pending  Radiology:Pending   ECG: NSR with T-wave inversion in the inferior/lateral leads.    Impression and Recommendations:  1. Abnormal EKG: She was found to be without symptoms with the exception of fatigue. EKG changes likely related to electrolyte imbalance with hyperglycemia.  Most recent cath one year ago demonstrated minimal disease in the CX and branches, but no other disease elsewhere. Repeat echo.  Repeat EKG now that she is improved on status. CXR today as one was not completed on admission, due to complaints of congestion  2. Hypertension: Blood pressure is well controlled currently. She remains on lasix and digoxin. Would hold digoxin at this time due to renal insufficiency.  Consider changing to BB instead if HR control is necessary. Lisinopril may need to be adjusted if renal function does not improve.   3. Renal insufficiency: Related to hyperglycemia. With IV fluids, she is improved. Lasix will be decreased to 20 mg daily. Echo pending for LV fx and diastolic dysfunction.  4. Uncontrolled diabetes; Per PCP. Now improved.     Signed: Phill Myron. Purcell Nails NP [facility] 02/02/2013, 8:48 AM Co-Sign MD:

## 2013-02-02 NOTE — Progress Notes (Signed)
Rechecked CBG it is 533. Md paged, he stated to recheck in another hour. Will recheck in one hour.

## 2013-02-02 NOTE — Consult Note (Signed)
[name] MRN: [id number] DOB/AGE: 1955/04/17 57 y.o. Primary Care Physician:MCINNIS,ANGUS G, MD Admit date: 02/01/2013 Chief Complaint: Consult for uncontrolled type 2 DM HPI: [name]  is a 22- yr- old female patient with multiple medical problems as follows. Patient is being seen in consultation for currently uncontrolled type 2 DM requested by Dr. Everette Rank. Patient was diagnosed with type 2 DM approximately 1 yrs ago. On January 31, 2013 , she presents to the Emergency Department complaining of hyperglycemia, weakness, and abnormal lab results from Dr.McInnis office, including severe hyperglycemia and acute renal failure.  She denies vomiting, diarrhea, or abdominal pain. No reported interruption of her medications. Sh is a poor historian, reports to me that she takes 2 different types of insulin daily  And some "pills". Chart review shos she was on Tradjenta. She was treated with insulin drip x 13 hours, converted to basal insulin, and has had rebound hyperglycemia on the floor. This prompted Endocrinology consult. Patient's has no  recent A1c in the Epic.  Patient reports moderate degree of polydipsia, polyuria, and nocturia.  She  admits to not monitoring BG regularly. Also has HTN, CHF on treatment and has been overweight to obese most of her adult life.  Pt gives a  family hx of type 2 DM in her mother. Patient is a former smoker, does not participate in a regular exercise program.  Past Medical History  Diagnosis Date  . Hypertension   . Iron deficiency anemia 10/16/2010  . Mental handicap 10/16/2010  . Diabetes mellitus without complication   . CHF (congestive heart failure)     Family History  Problem Relation Age of Onset  . Colon cancer Neg Hx   . Kidney disease      son reportedly has had cyst on kidney and lung s/p surgery at [facility]  . Diabetes Mother    Social History:  reports that she has quit smoking. Her smoking use included Cigarettes. She smoked 1.00 pack per day  for 0 years. She has never used smokeless tobacco. She reports that she does not drink alcohol or use illicit drugs.  Allergies: No Known Allergies  Medications Prior to Admission  Medication Sig Dispense Refill  . cefdinir (OMNICEF) 300 MG capsule Take 300 mg by mouth 2 (two) times daily.      . Choline Fenofibrate (FENOFIBRIC ACID) 135 MG CPDR Take 1 capsule by mouth daily.      . digoxin (LANOXIN) 0.125 MG tablet Take 0.125 mg by mouth daily.      . furosemide (LASIX) 40 MG tablet Take 40 mg by mouth daily.      Marland Kitchen HYDROcodone-homatropine (HYCODAN) 5-1.5 MG/5ML syrup Take 5 mLs by mouth every 4 (four) hours as needed for cough.      . insulin detemir (LEVEMIR) 100 UNIT/ML injection Inject 40 Units into the skin daily.      . isosorbide-hydrALAZINE (BIDIL) 20-37.5 MG per tablet Take 1 tablet by mouth 3 (three) times daily.      Marland Kitchen lisinopril (PRINIVIL,ZESTRIL) 20 MG tablet Take 20 mg by mouth daily.      . saxagliptin HCl (ONGLYZA) 5 MG TABS tablet Take 5 mg by mouth daily.        [id number] from the symptoms mentioned above,there are no other symptoms referable to all systems reviewed.  Physical Exam: Blood pressure 137/83, pulse 95, temperature 98.1 F (36.7 C), temperature source Oral, resp. rate 18, height 5\' 3"  (1.6 m), weight 91.5 kg (201 lb 11.5 oz), SpO2  98.00%. Gen: awake an No acute distress. HEENT: Moist Mucus membranes. Neck: no JVD Chest : CTAB CVS: s1 and s2 well heard, no murmur Abd: soft , non tender.  Ext: no edema. Neuro: non focal Skin: no rash, no hyperemia   Recent Labs  02/01/13 1107 02/02/13 0500  WBC 7.4 8.4  HGB 12.9 11.0*  HCT 37.9 32.7*  MCV 88.8 89.1  PLT 372 254    Recent Labs  02/01/13 1107 02/02/13 0500 02/02/13 1131  NA 124* 133*  --   K 4.8 4.8  --   CL 87* 101  --   CO2 22 22  --   GLUCOSE 565* 249* 531*  BUN 91* 72*  --   CREATININE 2.58* 2.08*  --   CALCIUM 10.0 9.1  --    No results found for this basename: AST, ALT,  ALKPHOS, BILITOT, PROT, ALBUMIN,  in the last 72 hours Recent Results (from the past 240 hour(s))  MRSA PCR SCREENING     Status: None   Collection Time    02/01/13  2:20 PM      Result Value Range Status   MRSA by PCR NEGATIVE  NEGATIVE Final   Comment:            The GeneXpert MRSA Assay (FDA     approved for NASAL specimens     only), is one component of a     comprehensive MRSA colonization     surveillance program. It is not     intended to diagnose MRSA     infection nor to guide or     monitor treatment for     MRSA infections.    No results found. Impression: 1) Uncontrolled type 2 DM with severe hyperglycemia. 2) CHF 3) Obesity 4) HTN 5) Acute renal failure Active Problems:   * No active hospital problems. *    Plan: Patient has currently uncontrolled symptomatic type 2 DM of approximately 1 year duration, with a1c pending. Acutely , to control glycemia she will need to be back on insulin drip. She is being prepared to return to ICU for same.  Her rebound hyperglycemia is likely a result of rapid conversion to basal/bolus insulin. She will be considered for appropriate basal/bolus insulin tomorrow. I will discontinue her linagliptin for now. On the long term, she  remains at a high risk for acute and chronic complications which include CAD, CVA, CKD, retinopathy, and neuropathy. These are all discussed in detail with the patient.  I have counseled the patient on diet management and weight loss, by adopting a carbohydrate restricted diet.  I noticed she is s/p diabetes education by the bed side, she will benefit form out patient endocrinology followup as well and i offered her that option. She will be reconsidered for appropriate oral medication options as out patient. I will follow her up in house.  Emalene Welte 02/02/2013, 5:11 PM

## 2013-02-02 NOTE — H&P (Signed)
[name], [name]              ACCOUNT NO.:  [id number]  MEDICAL RECORD NO.:  [id number]  LOCATION:  IC12                          FACILITY:  [facility]  PHYSICIAN:  Estill Bamberg. Karie Kirks, M.D.DATE OF BIRTH:  1955/11/11  DATE OF ADMISSION:  02/01/2013 DATE OF DISCHARGE:  LH                             HISTORY & PHYSICAL   HISTORY OF PRESENT ILLNESS:  This 57 year old woman presented to the office of Dr. Marjean Donna yesterday.  Apparently, she had been short of breath and coughing the day before.  She had lab tests done, which were quite abnormal, including a high potassium and very high sugar.  She ended up at [facility] for further evaluation.  Apparently also she has had fatigue and generalized myalgias with occasional fever.  She does have a history of iron-deficiency anemia, hypertension, renal failure acutely, hyperkalemia, and mental handicap.  She has also had congestive heart failure as well as diabetes and hypertension.  Surgeries include cholecystectomy, abdominal hysterectomy, surgery on her right knee at 58 years of age, cesarean section, colonoscopy, and EGD.  CURRENT MEDICATIONS AT HOME: 1. Omnicef 300 mg b.i.d. 2. Fenofibric acid 135 mg daily. 3. Digoxin 0.125 mg daily. 4. Furosemide 40 mg daily. 5. Levemir insulin 40 units at bedtime. 6. BiDil 20/37.5 t.i.d. 7. Lisinopril 20 mg daily. 8. Onglyza 5 mg daily.  PHYSICAL EXAMINATION:  GENERAL:  She is currently in the ICU.  She is in no acute distress.  She is well-developed and overweight. VITAL SIGNS:  Temperature is 97.6, pulse 83, respiratory rate 13, blood pressure 90/48, but it was 124/67 earlier. NEUROLOGIC:  Her speech appears to be normal.  She is a fairly good historian. SKIN:  Turgor is normal. HEENT:  Her mucous membranes are moist. HEART:  Her heart has a regular rhythm.  Rate of 80. LUNGS:  Clear throughout my exam. ABDOMEN:  Soft and somewhat obese without  organomegaly or mass or tenderness. EXTREMITIES:  There is no edema of the ankles.  LABORATORY DATA:  Her admission white cell count is 7400, hemoglobin 12.9.  Her serum sodium is 124, chloride 87, BUN 91, creatinine 2.58, and estimated GFR 19.  Her glucose is 565.  A 12-lead EKG showed a normal sinus rhythm.  T-wave inversion in inferior leads and lateral leads was noted.  ADMISSION DIAGNOSES: 1. Hyperglycemia. 2. Electrolyte abnormalities including hyponatremia and hypochloremia. 3. Benign essential hypertension. 4. Congestive heart failure by history. 5. Iron-deficiency anemia.  She is admitted to the ICU.  Her sugars have already dropped to the 200 range.  She is feeling better.  A CBC and BMP are pending in the morning.  She will see her LMD, Dr. Everette Rank, tomorrow on rounds.     Estill Bamberg. Karie Kirks, M.D.     SDK/MEDQ  D:  02/01/2013  T:  02/02/2013  Job:  [id number]

## 2013-02-02 NOTE — Progress Notes (Signed)
Inpatient Diabetes Program Recommendations  AACE/ADA: New Consensus Statement on Inpatient Glycemic Control (2013)  Target Ranges:  Prepandial:   less than 140 mg/dL      Peak postprandial:   less than 180 mg/dL (1-2 hours)      Critically ill patients:  140 - 180 mg/dL  Results for [name], [name] (MRN [id number]) as of 02/02/2013 08:07  Ref. Range 02/01/2013 10:49 02/01/2013 12:41 02/01/2013 13:58 02/01/2013 15:13 02/01/2013 16:13 02/01/2013 17:39 02/01/2013 18:43 02/01/2013 19:49 02/01/2013 20:52 02/01/2013 21:45 02/01/2013 22:45  Glucose-Capillary Latest Range: 70-99 mg/dL 552 (HH) 349 (H) 245 (H) 239 (H) 302 (H) 329 (H) 313 (H) 233 (H) 177 (H) 158 (H) 134 (H)   Results for [name], [name] (MRN [id number]) as of 02/02/2013 08:07  Ref. Range 02/02/2013 00:07 02/02/2013 01:06 02/02/2013 01:55 02/02/2013 08:08  Glucose-Capillary Latest Range: 70-99 mg/dL 160 (H) 149 (H) 158 (H) 321 (H)    Inpatient Diabetes Program Recommendations IV Fluids: Please re-evaluate need for dextrose in IVF; currently ordered D5 0.45% NS @ 75 ml/hr. Insulin - Basal: Please consider increasing Lantus to 40 units QHS. Correction (SSI): Please consider increasing Novolog correction to moderate scale and add Novolog bedtime correction. HgbA1C: Please consider ordering A1C to determine glycemic control over the past 2-3 months.  Note: According to the chart, patient was diagnosed with diabetes about a year ago and takes Levemir 40 units daily and Onglyza 5 mg daily as an outpatient for diabetes management.  Initial lab glucose was 565 mg/dl and patient was started on an insulin drip and then transitioned to SQ insulin this morning (given Lantus 30 units at 00:15 and IV insulin was stopped at 2:00 am).  Patient was on the insulin drip for approximately 13 hours and received a total of Novolin R 73.2 units over those 13 hours.  Currently, patient is ordered to receive Lantus 30 units QHS, Novolog 0-9 units AC, and Tradjenta 5 mg daily  for inpatient glycemic control.  Blood glucose was 158 mg/dl at 1:55 am and at 8:08 am blood glucose was up to 321 mg/dl.  Please increase Lantus to 40 units QHS, increase Novolog correction to moderate scale, add Novolog bedtime correction, and order an A1C.  Also, please re-evaluate need for dextrose in IV fluids.  Will continue to follow.  Thanks, Barnie Alderman, RN, MSN, CCRN Diabetes Coordinator Inpatient Diabetes Program [phone] (Team Pager) [phone] ([facility] office) [phone] [facility] office)

## 2013-02-02 NOTE — Progress Notes (Signed)
Pt CBG 480 and lab blood glucose 531. MD was called and he ordered 20 units of novolog stat. Will recheck CBG in one hour.

## 2013-02-02 NOTE — Progress Notes (Addendum)
Rechecked CBG it is 465. Diabetes coordinator recommends patient go back on glucose stabilizer. MD was notified and agreeable. Pt to be transferred back to ICU as a stepdown pt.

## 2013-02-03 LAB — BASIC METABOLIC PANEL
BUN: 60 mg/dL — ABNORMAL HIGH (ref 6–23)
BUN: 62 mg/dL — ABNORMAL HIGH (ref 6–23)
BUN: 60 mg/dL — ABNORMAL HIGH (ref 6–23)
CO2: 25 mEq/L (ref 19–32)
CO2: 24 mEq/L (ref 19–32)
CO2: 25 mEq/L (ref 19–32)
Calcium: 9 mg/dL (ref 8.4–10.5)
Calcium: 9.1 mg/dL (ref 8.4–10.5)
Calcium: 8.8 mg/dL (ref 8.4–10.5)
Chloride: 100 mEq/L (ref 96–112)
Chloride: 101 mEq/L (ref 96–112)
Chloride: 100 mEq/L (ref 96–112)
Creatinine, Ser: 2.67 mg/dL — ABNORMAL HIGH (ref 0.50–1.10)
Creatinine, Ser: 2.91 mg/dL — ABNORMAL HIGH (ref 0.50–1.10)
Creatinine, Ser: 2.56 mg/dL — ABNORMAL HIGH (ref 0.50–1.10)
GFR calc Af Amer: 22 mL/min — ABNORMAL LOW (ref >90)
GFR calc Af Amer: 20 mL/min — ABNORMAL LOW (ref >90)
GFR calc Af Amer: 23 mL/min — ABNORMAL LOW (ref >90)
GFR calc non Af Amer: 19 mL/min — ABNORMAL LOW (ref >90)
GFR calc non Af Amer: 17 mL/min — ABNORMAL LOW (ref >90)
GFR calc non Af Amer: 20 mL/min — ABNORMAL LOW (ref >90)
Glucose, Bld: 120 mg/dL — ABNORMAL HIGH (ref 70–99)
Glucose, Bld: 217 mg/dL — ABNORMAL HIGH (ref 70–99)
Glucose, Bld: 249 mg/dL — ABNORMAL HIGH (ref 70–99)
Potassium: 4.6 mEq/L (ref 3.5–5.1)
Potassium: 4.5 mEq/L (ref 3.5–5.1)
Potassium: 5 mEq/L (ref 3.5–5.1)
Sodium: 135 mEq/L (ref 135–145)
Sodium: 136 mEq/L (ref 135–145)
Sodium: 134 mEq/L — ABNORMAL LOW (ref 135–145)

## 2013-02-03 LAB — GLUCOSE, CAPILLARY
Glucose-Capillary: 110 mg/dL — ABNORMAL HIGH (ref 70–99)
Glucose-Capillary: 125 mg/dL — ABNORMAL HIGH (ref 70–99)
Glucose-Capillary: 157 mg/dL — ABNORMAL HIGH (ref 70–99)
Glucose-Capillary: 184 mg/dL — ABNORMAL HIGH (ref 70–99)
Glucose-Capillary: 184 mg/dL — ABNORMAL HIGH (ref 70–99)
Glucose-Capillary: 193 mg/dL — ABNORMAL HIGH (ref 70–99)
Glucose-Capillary: 200 mg/dL — ABNORMAL HIGH (ref 70–99)
Glucose-Capillary: 212 mg/dL — ABNORMAL HIGH (ref 70–99)
Glucose-Capillary: 241 mg/dL — ABNORMAL HIGH (ref 70–99)
Glucose-Capillary: 257 mg/dL — ABNORMAL HIGH (ref 70–99)
Glucose-Capillary: 304 mg/dL — ABNORMAL HIGH (ref 70–99)

## 2013-02-03 LAB — HEMOGLOBIN A1C
Hgb A1c MFr Bld: 14.1 % — ABNORMAL HIGH (ref <5.7)
Mean Plasma Glucose: 358 mg/dL — ABNORMAL HIGH (ref <117)

## 2013-02-03 LAB — TSH: TSH: 1.857 u[IU]/mL (ref 0.350–4.500)

## 2013-02-03 LAB — T4, FREE: Free T4: 1.36 ng/dL (ref 0.80–1.80)

## 2013-02-03 MED ORDER — SODIUM CHLORIDE 0.45 % IV SOLN
INTRAVENOUS | Status: DC
  Administered 2013-02-03: 06:00:00 via INTRAVENOUS

## 2013-02-03 MED ORDER — INSULIN ASPART 100 UNIT/ML ~~LOC~~ SOLN
0.0000 [IU] | Freq: Three times a day (TID) | SUBCUTANEOUS | Status: DC
  Administered 2013-02-03: 5 [IU] via SUBCUTANEOUS
  Administered 2013-02-03: 3 [IU] via SUBCUTANEOUS
  Administered 2013-02-03: 4 [IU] via SUBCUTANEOUS
  Administered 2013-02-04: 2 [IU] via SUBCUTANEOUS
  Administered 2013-02-04: 1 [IU] via SUBCUTANEOUS
  Administered 2013-02-04: 2 [IU] via SUBCUTANEOUS
  Administered 2013-02-05: 1 [IU] via SUBCUTANEOUS
  Administered 2013-02-05: 3 [IU] via SUBCUTANEOUS
  Administered 2013-02-05 – 2013-02-06 (×2): 2 [IU] via SUBCUTANEOUS

## 2013-02-03 MED ORDER — INSULIN GLARGINE 100 UNIT/ML ~~LOC~~ SOLN
50.0000 [IU] | Freq: Every day | SUBCUTANEOUS | Status: DC
  Administered 2013-02-03 – 2013-02-06 (×4): 50 [IU] via SUBCUTANEOUS
  Filled 2013-02-03 (×4): qty 0.5

## 2013-02-03 MED ORDER — ENOXAPARIN SODIUM 30 MG/0.3ML ~~LOC~~ SOLN
30.0000 mg | SUBCUTANEOUS | Status: DC
  Administered 2013-02-03: 30 mg via SUBCUTANEOUS
  Filled 2013-02-03: qty 0.3

## 2013-02-03 MED ORDER — INSULIN ASPART 100 UNIT/ML ~~LOC~~ SOLN
10.0000 [IU] | Freq: Three times a day (TID) | SUBCUTANEOUS | Status: DC
  Administered 2013-02-03 – 2013-02-06 (×10): 10 [IU] via SUBCUTANEOUS

## 2013-02-03 MED ORDER — INSULIN GLARGINE 100 UNIT/ML ~~LOC~~ SOLN
30.0000 [IU] | Freq: Once | SUBCUTANEOUS | Status: AC
  Administered 2013-02-03: 30 [IU] via SUBCUTANEOUS
  Filled 2013-02-03: qty 0.3

## 2013-02-03 MED ORDER — INSULIN ASPART 100 UNIT/ML ~~LOC~~ SOLN: 0.0000 [IU] | SUBCUTANEOUS | Status: DC

## 2013-02-03 MED ORDER — INSULIN GLARGINE 100 UNIT/ML ~~LOC~~ SOLN
50.0000 [IU] | Freq: Every day | SUBCUTANEOUS | Status: DC
  Filled 2013-02-03: qty 0.5

## 2013-02-03 MED ORDER — INSULIN GLARGINE 100 UNIT/ML ~~LOC~~ SOLN: 30.0000 [IU] | Freq: Two times a day (BID) | SUBCUTANEOUS | Status: DC

## 2013-02-03 NOTE — Progress Notes (Signed)
KOLBEE QUILL MRN: [id number] DOB/AGE: 05/06/1955 57 y.o. Primary Care Physician:MCINNIS,ANGUS G, MD Admit date: 02/01/2013 Chief Complaint: F/U  for uncontrolled type 2 DM S: did well overnight on insulin drip. Eating well. No hypoglycemia.  Physical Exam: Blood pressure 113/59, pulse 83, temperature 97.6 F (36.4 C), temperature source Oral, resp. rate 14, height 5\' 3"  (1.6 m), weight 91.6 kg (201 lb 15.1 oz), SpO2 98.00%. Gen: awake an No acute distress. HEENT: Moist Mucus membranes. Neck: no JVD Chest : CTAB CVS: s1 and s2 well heard, no murmur Abd: soft , non tender.  Ext: no edema. Neuro: non focal Skin: no rash, no hyperemia   Recent Labs  02/01/13 1107 02/02/13 0500  WBC 7.4 8.4  HGB 12.9 11.0*  HCT 37.9 32.7*  MCV 88.8 89.1  PLT 372 254    Recent Labs  02/03/13 0451 02/03/13 0805  NA 136 134*  K 4.5 5.0  CL 101 100  CO2 24 25  GLUCOSE 217* 249*  BUN 62* 60*  CREATININE 2.91* 2.56*  CALCIUM 9.1 8.8    Impression: 1) Uncontrolled type 2 DM with severe hyperglycemia. 2) CHF 3) Obesity 4) HTN 5) Acute renal failure Active Problems:   * No active hospital problems. *    Plan: Patient has currently uncontrolled symptomatic type 2 DM of approximately 1 year duration, with a1c  14.1%.  I will convert her to basal /bolus insulin as in orders. She may be transferred back to floors today.  On the long term, she  remains at a high risk for acute and chronic complications which include CAD, CVA, CKD, retinopathy, and neuropathy. These are all discussed in detail with the patient.  I have counseled the patient on diet management and weight loss, by adopting a carbohydrate restricted diet.  I noticed she is s/p diabetes education by the bed side, she will benefit form out patient endocrinology followup as well and i offered her that option. She will be reconsidered for appropriate oral medication options as out patient. I will follow her up in  house.  Leston Schueller 02/03/2013, 11:38 AM

## 2013-02-03 NOTE — Progress Notes (Signed)
  RD consulted for nutrition education regarding diabetes.   Lab Results  Component Value Date   HGBA1C 14.1* 02/02/2013    RD provided "Carbohydrate Counting for People with Diabetes" handout from the Academy of Nutrition and Dietetics. Discussed different food groups and their effects on blood sugar, emphasizing carbohydrate-containing foods. Provided list of carbohydrates and recommended serving sizes of common foods.  Discussed importance of controlled and consistent carbohydrate intake throughout the day. Provided examples of ways to balance meals/snacks and encouraged intake of high-fiber, whole grain foods. Teach back method used. Expect fair compliance.   Body mass index is 35.78 kg/(m^2). Pt meets criteria for obesity class IIbased on current BMI.  Current diet order is CHO Modifed patient is consuming approximately 100% of meals at this time. Labs and medications reviewed. No further nutrition interventions warranted at this time. RD contact information provided. Recommend follow up with outpatient RD for additional education to reinforce basic DM diet principles and improve patient adherance.  Colman Cater MS,RD,CSG,LDN Office: [phone] Pager: [phone]

## 2013-02-03 NOTE — Progress Notes (Signed)
[name], [name]              ACCOUNT NO.:  [id number]  MEDICAL RECORD NO.:  [id number]  LOCATION:  IC12                          FACILITY:  [facility]  PHYSICIAN:  Trystin Hargrove G. Everette Rank, MD   DATE OF BIRTH:  09-May-1955  DATE OF PROCEDURE: DATE OF DISCHARGE:                                PROGRESS NOTE   SUBJECTIVE:  This patient was admitted through the emergency department with weakness and markedly elevated blood sugar, potassium, and impaired kidney function.  By the time she came to the emergency room, her potassium was more normal.  She was started on IV fluids, saline, sliding scale, insulin, Humulin R, as well as Lantus insulin.  Her blood sugars were extremely high initially, but this morning blood sugars were 249, potassium 4.8.  OBJECTIVE:  GENERAL:  She is alert and oriented. VITAL SIGNS:  Blood pressure 110/53, respirations 16, pulse 98, temp 98. HEENT:  Eyes, PERRLA.  TMs negative.  Oropharynx benign. NECK:  Supple.  No JVD or thyroid abnormalities. HEART:  Regular rhythm.  No murmurs. LUNGS:  Clear to P and A. ABDOMEN:  No palpable organs or masses.  No organomegaly. EXTREMITIES:  Free of edema.  ASSESSMENT:  The patient has diabetes, was admitted with markedly elevated blood sugars.  Hyperkalemia, which corrected and elevated creatinine.  She does have abnormal EKG, which was T-wave abnormalities, which is new since previous EKG.  PLAN:  To continue current IV fluids.  Continue to monitor diabetes. Continue with the sliding scale, insulin, and basal insulin.  We will obtain Cardiology consult with regard to the abnormality on the EKG.     Lion Fernandez G. Everette Rank, MD     AGM/MEDQ  D:  02/02/2013  T:  02/03/2013  Job:  [id number]

## 2013-02-04 LAB — BASIC METABOLIC PANEL
BUN: 50 mg/dL — ABNORMAL HIGH (ref 6–23)
CO2: 25 mEq/L (ref 19–32)
Calcium: 8.9 mg/dL (ref 8.4–10.5)
Chloride: 101 mEq/L (ref 96–112)
Creatinine, Ser: 2.11 mg/dL — ABNORMAL HIGH (ref 0.50–1.10)
GFR calc Af Amer: 29 mL/min — ABNORMAL LOW (ref >90)
GFR calc non Af Amer: 25 mL/min — ABNORMAL LOW (ref >90)
Glucose, Bld: 165 mg/dL — ABNORMAL HIGH (ref 70–99)
Potassium: 4.3 mEq/L (ref 3.5–5.1)
Sodium: 136 mEq/L (ref 135–145)

## 2013-02-04 LAB — GLUCOSE, CAPILLARY
Glucose-Capillary: 170 mg/dL — ABNORMAL HIGH (ref 70–99)
Glucose-Capillary: 156 mg/dL — ABNORMAL HIGH (ref 70–99)
Glucose-Capillary: 154 mg/dL — ABNORMAL HIGH (ref 70–99)
Glucose-Capillary: 196 mg/dL — ABNORMAL HIGH (ref 70–99)
Glucose-Capillary: 213 mg/dL — ABNORMAL HIGH (ref 70–99)

## 2013-02-04 MED ORDER — FLUCONAZOLE 100 MG PO TABS
150.0000 mg | ORAL_TABLET | Freq: Once | ORAL | Status: AC
  Administered 2013-02-04: 150 mg via ORAL
  Filled 2013-02-04: qty 2

## 2013-02-04 MED ORDER — ENOXAPARIN SODIUM 40 MG/0.4ML ~~LOC~~ SOLN
40.0000 mg | SUBCUTANEOUS | Status: DC
  Administered 2013-02-04 – 2013-02-05 (×2): 40 mg via SUBCUTANEOUS
  Filled 2013-02-04 (×2): qty 0.4

## 2013-02-04 NOTE — Progress Notes (Signed)
Patient transferred to dept 300, room 309. Patient transported in wheelchair with her belongings. Bedside report given to Sharyn Blitz, RN. Patient alert, oriented and in stable condition at the time of transport.

## 2013-02-04 NOTE — Progress Notes (Signed)
NAMEAUDREIGH, [name]              ACCOUNT NO.:  [id number]  MEDICAL RECORD NO.:  [id number]  LOCATION:  IC12                          FACILITY:  [facility]  PHYSICIAN:  Rahkim Rabalais G. Zaniya Mcaulay, MD   DATE OF BIRTH:  17-Sep-1955  DATE OF PROCEDURE: DATE OF DISCHARGE:                                PROGRESS NOTE   SUBJECTIVE:  This patient had to be moved back to the step-down unit and in intensive care because of elevated blood sugars and because of the fact she needed to go back on IV insulin Glucommander protocol.  Her sugars now seem to be more stable in the 157-200 range.  S had been treated with IV fluids, saline, now she is on Lantus insulin and Humulin R sliding scale.  OBJECTIVE:  VITAL SIGNS:  Blood pressure 91/45, respirations 12, pulse 95, temp 97.9. HEENT:  Eyes PERRLA.  TM negative.  Oropharynx benign. NECK:  Supple.  No JVD or thyroid abnormalities. HEART:  Regular rhythm.  No murmurs. LUNGS:  Clear to P and A. ABDOMEN:  No palpable organs or masses.  No organomegaly. EXTREMITIES:  Free of edema.  ASSESSMENT:  The patient has diabetes with markedly elevated blood sugars.  She did have hyperkalemia which corrected.  She does have elevated creatinine.  An EKG with T-wave abnormalities.  PLAN:  To continue current IV fluids.  Continue sliding scale insulin and basal insulin which will have to be increased.     Claudetta Sallie G. Everette Rank, MD     AGM/MEDQ  D:  02/03/2013  T:  02/04/2013  Job:  [id number]

## 2013-02-05 ENCOUNTER — Encounter (HOSPITAL_COMMUNITY): Payer: Self-pay

## 2013-02-05 LAB — GLUCOSE, CAPILLARY
Glucose-Capillary: 143 mg/dL — ABNORMAL HIGH (ref 70–99)
Glucose-Capillary: 175 mg/dL — ABNORMAL HIGH (ref 70–99)
Glucose-Capillary: 177 mg/dL — ABNORMAL HIGH (ref 70–99)
Glucose-Capillary: 234 mg/dL — ABNORMAL HIGH (ref 70–99)

## 2013-02-05 NOTE — Progress Notes (Signed)
NAMEMEL, MALCOM              ACCOUNT NO.:  [id number]  MEDICAL RECORD NO.:  [id number]  LOCATION:  A309                          FACILITY:  [facility]  PHYSICIAN:  Violeta Lecount G. Everette Rank, MD   DATE OF BIRTH:  01/21/1956  DATE OF PROCEDURE: DATE OF DISCHARGE:                                PROGRESS NOTE   SUBJECTIVE:  This patient now is on sliding scale insulin and Lantus insulin.  She is stable.  Her creatinine has improved and she is feeling better.  Blood sugars ranged from 170-213.  OBJECTIVE:  VITAL SIGNS:  Blood pressure 99/50, respirations 20, pulse 82, temp 97.4. HEENT:  Eyes, PERRLA.  TMs negative.  Oropharynx benign. NECK:  Supple.  No JVD or thyroid abnormalities. HEART:  Regular rhythm.  No murmurs. LUNGS:  Clear to P and A. ABDOMEN:  No palpable organs or masses. EXTREMITIES:  Free of edema.  ASSESSMENT:  The patient was admitted with markedly elevated blood sugar.  She does have diabetes, did have hyperkalemia which was corrected.  Her renal function seems to be improving as well.  PLAN:  To continue current regimen, sliding scale insulin, and continue current dosage of Lantus insulin and the patient will be discharged in a.m.     Amit Leece G. Everette Rank, MD     AGM/MEDQ  D:  02/05/2013  T:  02/05/2013  Job:  [id number]

## 2013-02-05 NOTE — Progress Notes (Signed)
[name], [name]             ACCOUNT NO.:  [id number]  MEDICAL RECORD NO.:  [id number]  LOCATION:                                 FACILITY:  PHYSICIAN:  Angelicia Lessner G. Alzada Brazee, MD        DATE OF BIRTH:  DATE OF PROCEDURE: DATE OF DISCHARGE:                                PROGRESS NOTE   This patient had to be brought back to step-down bed in ICU yesterday because the fact she had to be put back on IV insulin.  Her sugars have progressively came down with this treatment and are in lower range now. Sugars ranged from 217 to 165.  Her creatinine was dropping to 211 with a BUN of 50 and she is feeling some better.  OBJECTIVE:  VITAL SIGNS:  Blood pressure 107/51, respirations 15, pulse 92, temp 97.7. HEENT:  Eyes, PERRLA.  TMs negative.  Oropharynx benign. NECK:  Supple.  No JVD or thyroid abnormalities. HEART:  Regular rhythm.  No murmurs. LUNGS:  Clear to P and A. ABDOMEN:  No palpable organs or masses. EXTREMITIES:  Free of edema.  ASSESSMENT:  The patient has diabetes, was admitted with markedly elevated blood sugars, hyperkalemia which corrected and elevated creatinine.  She is improving.  PLAN:  To continue current IV fluids.  Continue sliding scale insulin coverage and current dose of Lantus insulin.  The patient can be moved out of ICU to med/surg bed.     Devantae Babe G. Everette Rank, MD     AGM/MEDQ  D:  02/04/2013  T:  02/05/2013  Job:  [id number]

## 2013-02-06 LAB — GLUCOSE, CAPILLARY: Glucose-Capillary: 170 mg/dL — ABNORMAL HIGH (ref 70–99)

## 2013-02-06 NOTE — Progress Notes (Signed)
Spoke with patient about diabetes management. Discussed A1C results (14.1% on 02/02/2013) and explained what an A1C is, basic pathophysiology of DM Type 2, basic home care, importance of checking CBGs and maintaining good CBG control to prevent long-term and short-term complications. Patient reports that she checks her blood glucose 3-4 times a day at home already.  Asked that she continue to check blood glucose 4 times a day and to take record results and take them with her to follow up visit with Dr. Everette Rank.  Discussed impact of nutrition, stress, and exercise on diabetes control.  Reviewed carbohydrates and how to read food labels to determine amount of carbohydrates in food and drink choices.  Patient states that she drinks regular drinks mostly and diet drinks rarely.  Asked that patient only drink fluids that are diet and those without added sugars unless her blood glucose is low (less than 70 mg/dl).  Patient states that she will start drinking only diet drinks and water.  Provided patient with handout on Daily Meal Planning Guide, Taking Charge by Counting Carbs, and information on free outpatient Diabetes Education class held at [place], encouraged patient to discuss option of being referred to Dr. Dorris Fetch for diabetes managment with Dr. Everette Rank and provided patient with business card for Dr. Dorris Fetch.  Patient verbalized understanding of information discussed and reports that she has no further questions at this time.    Thanks, Barnie Alderman, RN, MSN, CCRN Diabetes Coordinator Inpatient Diabetes Program [phone] (Team Pager) [phone] ([facility] office) [phone] [facility] office)

## 2013-02-06 NOTE — Care Management Note (Signed)
    Page 1 of 1   02/06/2013     9:12:25 AM   CARE MANAGEMENT NOTE 02/06/2013  Patient:  [name], [name]   Account Number:  [id number]  Date Initiated:  02/06/2013  Documentation initiated by:  Theophilus Kinds  Subjective/Objective Assessment:   Pt admitted from home with CHF. Pt lives with her mother and father and son. Pt is independent with ADL's.     Action/Plan:   Pt discharged home today. No CM needs noted.   Anticipated DC Date:  02/06/2013   Anticipated DC Plan:  [place]  CM consult      Choice offered to / List presented to:             Status of service:  Completed, signed off Medicare Important Message given?  YES (If response is "NO", the following Medicare IM given date fields will be blank) Date Medicare IM given:  02/06/2013 Date Additional Medicare IM given:    Discharge Disposition:  HOME/SELF CARE  Per UR Regulation:    If discussed at Long Length of Stay Meetings, dates discussed:    Comments:  02/06/13 [place], RN BSN CM

## 2013-02-06 NOTE — Discharge Summary (Signed)
[name], RAJEWSKI              ACCOUNT NO.:  [id number]  MEDICAL RECORD NO.:  [id number]  LOCATION:  A309                          FACILITY:  [facility]  PHYSICIAN:  Daymien Goth G. Everette Rank, MD   DATE OF BIRTH:  1955/08/09  DATE OF ADMISSION:  02/01/2013 DATE OF DISCHARGE:  11/10/2014LH                              DISCHARGE SUMMARY   Five days hospitalization.  DIAGNOSES: 1. Type 2 diabetes with hyperglycemia. 2. Hyponatremia. 3. Hypochloremia. 4. Benign essential hypertension. 5. Iron-deficiency anemia. 6. Acute on chronic renal insufficiency, stage II. 7. Exogenous obesity. 8. Diastolic congestive heart failure by history.  CONDITION:  Stable at the time of her discharge.  HISTORY OF PRESENT ILLNESS:  This patient presented to the office of her primary care doctor, Dr. Everette Rank 1 day prior to this admission.  She then had some cough and dyspnea prior to her being seen.  Lab tests were done, which were quite abnormal including high potassium, high blood sugar.  She presented to at [facility] Emergency Room for further evaluation.  She apparently had been experiencing some fatigue and generalized myalgia.  She does have a history of iron-deficiency anemia, hypertension, renal failure, acutely hyperkalemia, mental handicap.  She also had history of congestive heart failure as well as diabetes and hypertension.  Her elevated serum potassium was prior to her being seen in emergency room and seen by admitting physician.  It was felt she should be admitted for further evaluation.  PHYSICAL EXAMINATION:  GENERAL:  On admission, well-developed, overweight patient. VITAL SIGNS:  Blood pressure 90/48, respirations 13, pulse 83, temp 97.6. HEENT:  Eyes, PERRLA.  TMs negative.  Oropharynx benign. NECK:  Supple.  No JVD or thyroid abnormalities. LUNGS:  Clear to P and A. HEART:  Regular rhythm with a rate of 80. ABDOMEN:  Obese with no organomegaly. EXTREMITIES:  Free of  edema.  LABORATORY DATA:  On admission, CBC is 7400 with hemoglobin 12.9.  Her chemistry showed a sodium 124, chloride 87, BUN 91, creatinine 2.58. Her glucose was 565 on admission.  Subsequent labs, UA on admission negative.  Chemistries on February 05, 2013, glucose 234, sodium 136, potassium 4.3, chloride 101, CO2 of 25, BUN 50, creatinine 211 calcium 8.9, GFR 25.  Thyroid TSH 1.857, free T4 1.36.  Digoxin level 1.8.  X- rays, no results.  HOSPITAL COURSE:  The patient initially was started and continued on Lanoxin 0.125 daily, Lovenox 40 mg q.24 hours.  Injection for DVT prophylaxis was continued.  She was continued on Lasix 40 mg daily, isosorbide hydralazine 20/37.5 mg t.i.d., and lisinopril 20 mg daily. She is also started on intravenous fluids saline.  She was initially started on low-dose Lantus insulin and NovoLog insulin according to sliding scale.  Her sugars remain elevated in the 300-400 range and she is put in ICU and started on insulin drip.  She is also seen in consultation by an endocrinologist Dr. Payton Mccallum who increased her Lantus insulin and modified sliding scale.  The Lantus insulin was increased up to 50 units daily.  IV fluids were continued.  Her sugars did return to more stable range in the 150-200, 25 range.  Diabetic diet was continued.  The patient did stabilize on this regimen.  The endocrinologist discussing chronic complications of diabetes with the patient and recommended that she continue on diet and the same dosage of insulin.  He offered her the option of seeing him as an outpatient for better diabetic control.     Kleo Dungee G. Everette Rank, MD     AGM/MEDQ  D:  02/05/2013  T:  02/06/2013  Job:  [id number]

## 2013-02-06 NOTE — Discharge Summary (Signed)
NAMETIARA, FENLEY              ACCOUNT NO.:  [id number]  MEDICAL RECORD NO.:  [id number]  LOCATION:  A309                          FACILITY:  [facility]  PHYSICIAN:  Teela Narducci G. Everette Rank, MD   DATE OF BIRTH:  09/13/1955  DATE OF ADMISSION:  02/01/2013 DATE OF DISCHARGE:  LH                              DISCHARGE SUMMARY   ADDENDUM:  DIAGNOSIS: Systolic congestive heart failure, improved.  The patient will be sent home on the following medications:  Lanoxin 0.125 mg daily, furosemide 40 mg daily, Omnicef 300 mg b.i.d., Hycodan syrup a teaspoon every 4 hours as needed for cough, Prinivil 20 mg daily, Onglyza 5 mg daily, Levemir insulin 50 units daily, fenofibrate 135 mg daily, isosorbide, hydralazine 37.5 mg daily.  T  The patient is to return to primary care physician's office Thursday, February 09, 2013.  CONDITION:  Stable.     Treyana Sturgell G. Everette Rank, MD     AGM/MEDQ  D:  02/06/2013  T:  02/06/2013  Job:  [id number]

## 2013-02-06 NOTE — Discharge Planning (Signed)
Pt stated she was ready to go home and she had no pain.  Pt's IV was removed and she was given DC papers and educated about diabetes maintenance and CHF protocol.  Pt indicate that she already uses a scale at home.  Pt told of FU appointments.  Pt will be wheeled to car by PCT and family when ride arrives.

## 2013-02-06 NOTE — Discharge Planning (Signed)
Pt educated about the proper way to weigh herself daily at home.  Pt indicated that she has a scale at home and was already keeping track daily and writing her weights down for the doctor.

## 2013-02-10 NOTE — Discharge Summary (Signed)
NAMEHILDRETH, MASTEN              ACCOUNT NO.:  [id number]  MEDICAL RECORD NO.:  [id number]  LOCATION:  A309                          FACILITY:  [facility]  PHYSICIAN:  Trayson Stitely G. Everette Rank, MD   DATE OF BIRTH:  1956/01/28  DATE OF ADMISSION:  02/01/2013 DATE OF DISCHARGE:  11/10/2014LH                              DISCHARGE SUMMARY   ADDENDUM:  Diastolic congestive heart failure acute and chronic.     Ronae Noell G. Everette Rank, MD     AGM/MEDQ  D:  02/08/2013  T:  02/09/2013  Job:  [id number]

## 2014-02-19 ENCOUNTER — Encounter: Payer: Medicare Other | Attending: "Endocrinology | Admitting: Nutrition

## 2014-02-19 ENCOUNTER — Encounter: Payer: Self-pay | Admitting: Nutrition

## 2014-02-19 VITALS — Ht 62.5 in | Wt 216.0 lb

## 2014-02-19 DIAGNOSIS — Z713 Dietary counseling and surveillance: Secondary | ICD-10-CM | POA: Insufficient documentation

## 2014-02-19 DIAGNOSIS — E118 Type 2 diabetes mellitus with unspecified complications: Secondary | ICD-10-CM | POA: Insufficient documentation

## 2014-02-19 DIAGNOSIS — IMO0002 Reserved for concepts with insufficient information to code with codable children: Secondary | ICD-10-CM

## 2014-02-19 DIAGNOSIS — Z794 Long term (current) use of insulin: Secondary | ICD-10-CM | POA: Diagnosis not present

## 2014-02-19 DIAGNOSIS — E1165 Type 2 diabetes mellitus with hyperglycemia: Secondary | ICD-10-CM

## 2014-02-19 NOTE — Progress Notes (Signed)
She doesn't have a rash or vaginal itching and doesn't need to  See her PCP for that.

## 2014-02-19 NOTE — Progress Notes (Signed)
  Medical Nutrition Therapy:  Appt start time: 1130 end time:  1200.   Assessment:  Primary concerns today: DIabetes. Lives by herself. Does her own shopping and cooking. Sometimes skips breakfast: 80 units of Levemir at night and 15" units of Novolog with meals. Complains of thirst, increased hunger and increased urination. Eats a lot of snacks and junk food late at night. Gained 10+ lbs over the last year or more. Here to see Dr. Dorris Fetch today.  PT notes she has a rash under per abdomen and vaginal itching. Recommended she contact per PCP, Dr. Everette Rank to address those health issues.    Preferred Learning Style:   No preference indicated    Learning Readiness:     Ready  Change in progress   MEDICATIONS See list   DIETARY INTAKE:    24-hr recall:  B ( AM): Skips breakfast Snk ( AM): none  L ( PM): Sometimes left over Snk ( PM): chips, cookies, cracakers D ( PM):  Pork chop, corn,  Snk ( PM): anything; usually snacks late at night. Beverages: water, 2% milk,Soda-regular, Tea-sweet Usual physical activity:  none  Estimated energy needs: 1500 calories 170 g carbohydrates 112 g protein 42 g fat  Progress Towards Goal(s):  In progress.   Nutritional Diagnosis:  NB-1.1 Food and nutrition-related knowledge deficit As related to Diabetes.  As evidenced by A1C 10.7%.    Intervention:  Nutrition counseling and diabetes education on diet, exercise, weight loss and reducing complications.  Plan:  Aim for 2-3 Carb Choices per meal (30-45 grams) +/- 1 either way  No snacks between meals Cut out sodas, sweet tea and sweets Increase water intake to 8 glasses per day Include protein in moderation with your meals  Consider  increasing your activity level by 15 for 30 minutes daily as tolerated Continue checking BG 4 times per day per Dr. Dorris Fetch. Consider taking medication  Levemir 80 units daily, Trajenta daily and Novolog with meals and metformin as directed by MD Goal: Get A1C  down to 8% in three months. 2. Lose 1 lb per week til next visit. 3. Follow up with PCP about fungal infections/rash.   Teaching Method Utilized: Visual Auditory Hands on  Handouts given during visit include: The Plate Method Meal Plan Card   Barriers to learning/adherence to lifestyle change: mental handicap  Demonstrated degree of understanding via:  Teach Back   Monitoring/Evaluation:  Dietary intake, exercise, meal planning, and body weight in 3 week(s) for more detailed diabetes education.Marland Kitchen

## 2014-03-08 ENCOUNTER — Encounter (HOSPITAL_COMMUNITY): Payer: Self-pay | Admitting: Cardiovascular Disease

## 2014-03-09 ENCOUNTER — Encounter: Payer: Medicare Other | Admitting: Nutrition

## 2014-07-19 ENCOUNTER — Other Ambulatory Visit (HOSPITAL_COMMUNITY): Payer: Self-pay | Admitting: Family Medicine

## 2014-07-19 DIAGNOSIS — Z1231 Encounter for screening mammogram for malignant neoplasm of breast: Secondary | ICD-10-CM

## 2014-07-23 ENCOUNTER — Encounter: Payer: Medicare Other | Attending: "Endocrinology | Admitting: Nutrition

## 2014-07-23 ENCOUNTER — Encounter: Payer: Self-pay | Admitting: Nutrition

## 2014-07-23 VITALS — Ht 62.0 in | Wt 209.0 lb

## 2014-07-23 DIAGNOSIS — Z713 Dietary counseling and surveillance: Secondary | ICD-10-CM | POA: Insufficient documentation

## 2014-07-23 DIAGNOSIS — E118 Type 2 diabetes mellitus with unspecified complications: Secondary | ICD-10-CM | POA: Insufficient documentation

## 2014-07-23 DIAGNOSIS — E1165 Type 2 diabetes mellitus with hyperglycemia: Secondary | ICD-10-CM

## 2014-07-23 DIAGNOSIS — IMO0002 Reserved for concepts with insufficient information to code with codable children: Secondary | ICD-10-CM

## 2014-07-23 DIAGNOSIS — Z794 Long term (current) use of insulin: Secondary | ICD-10-CM | POA: Insufficient documentation

## 2014-07-23 DIAGNOSIS — Z6838 Body mass index (BMI) 38.0-38.9, adult: Secondary | ICD-10-CM | POA: Diagnosis not present

## 2014-07-23 DIAGNOSIS — E669 Obesity, unspecified: Secondary | ICD-10-CM | POA: Diagnosis not present

## 2014-07-23 NOTE — Progress Notes (Signed)
  Medical Nutrition Therapy:  Appt start time: 1100 end time:  1130.  Assessment:  Primary concerns today: Diabetes. Follow up She has cut out snacks. She is drinking more water. She still gets cravings for sweets at times. Caseworker is here with her today. Needs cooking tools to bake and broil foods. GOing to the grocery store to buy items for breakfast and lunch Most recent A1C down to 10.9% from 14%.  Taking 80 units of Levemir at night and Novolog 24 units with meals plus sliding scale.   Meal pattern is inconsistent. Needs more lower carb vegetables and fresh fruit and eat breakfast daily.  Tends to skip meals at times. Has ran out of her Novolog. Will give a sample today to cover til she sees DR. Nida next week for prescription. Not exercising. Doesn't like to go places by herself.   Preferred Learning Style  Auditory  Visual  Hands on   Learning Readiness:  Ready  Change in progress   MEDICATIONS: See list  DIETARY INTAKE:  24-hr recall:  B ( AM): Skips breakfast- says she doesn't get hungry and eating can make her sick. Snk ( AM): none  L ( PM): TV meal; meatloaf, green beans and potatoes,with sugar free tea. Snk ( PM): D ( PM):  TV dinner. water Snk ( PM): anything; usually snacks late at night. Beverages: water, unsweet tea,  Usual physical activity: none.  Estimated energy needs: 1500 calories 170 g carbohydrates 112 g protein 42 g fat  Progress Towards Goal(s):  In progress.   Nutritional Diagnosis:  NB-1.1 Food and nutrition-related knowledge deficit As related to Diabetes.  As evidenced by A1C 10.7%.    Intervention:  Nutrition counseling meal planning and My Plate for better balanced meals. Reviewed target ranges for blood sugars and prevention of complications.  Plan:  Goal. Cut out diet sodas and try to just more water.. Add more vegetables and fresh fruit. Eat 1 egg and 2 pieces of toast for breakfast OR 1 bowl of cherrios, 1 cup milk and 1  egg. Do not skip meals. Increase fresh fruits and low carb meals for improved blood sugar. Start exercising 30 minutes daily. Take medications as prescribed. Lose 1 lb per week til next visit. Get A1C down to 8% in three months  Teaching Method Utilized: Visual Auditory Hands on  Handouts given during visit include: The Plate Method Meal Plan Card  Barriers to learning/adherence to lifestyle change: mental handicap  Demonstrated degree of understanding via:  Teach Back   Monitoring/Evaluation:  Dietary intake, exercise, meal planning, and body weight in 1- 2 month(s) for more detailed diabetes education and improved DM.

## 2014-07-23 NOTE — Patient Instructions (Addendum)
Goal. Cut out diet sodas and try to just more water.. Add more vegetables and fresh fruit. Eat 1 egg and 2 pieces of toast for breakfast OR 1 bowl of cherrios, 1 cup milk and 1 egg. Do not skip meals. Increase fresh fruits and low carb meals for improved blood sugar. Start exercising 30 minutes daily. Take medications as prescribed. Lose 1 lb per week til next visit. Get A1C down to 8% in three months

## 2014-08-10 ENCOUNTER — Ambulatory Visit (HOSPITAL_COMMUNITY)
Admission: RE | Admit: 2014-08-10 | Discharge: 2014-08-10 | Disposition: A | Discharge status: Home / Self Care | Payer: Medicare Other | Source: Ambulatory Visit | Attending: Family Medicine | Admitting: Family Medicine

## 2014-08-10 DIAGNOSIS — Z1231 Encounter for screening mammogram for malignant neoplasm of breast: Secondary | ICD-10-CM | POA: Diagnosis not present

## 2014-10-04 ENCOUNTER — Encounter: Payer: Medicare Other | Attending: "Endocrinology | Admitting: Nutrition

## 2014-10-04 DIAGNOSIS — Z713 Dietary counseling and surveillance: Secondary | ICD-10-CM | POA: Insufficient documentation

## 2014-10-04 DIAGNOSIS — E669 Obesity, unspecified: Secondary | ICD-10-CM | POA: Diagnosis not present

## 2014-10-04 DIAGNOSIS — Z6838 Body mass index (BMI) 38.0-38.9, adult: Secondary | ICD-10-CM | POA: Insufficient documentation

## 2014-10-04 DIAGNOSIS — E118 Type 2 diabetes mellitus with unspecified complications: Secondary | ICD-10-CM | POA: Diagnosis present

## 2014-10-04 DIAGNOSIS — Z794 Long term (current) use of insulin: Secondary | ICD-10-CM | POA: Insufficient documentation

## 2014-10-04 NOTE — Patient Instructions (Signed)
Goal: 1. Eat 5 fruit and vegetables per day. 2. Will walking 15 mins twice a day,. 3. Get a1c to 8% in three months. 4. Lose 3 pounds a month.

## 2014-10-04 NOTE — Progress Notes (Signed)
  Medical Nutrition Therapy:  Appt start time: 1100 end time:  1130.  Assessment:  Primary concerns today: Diabetes. Follow up  Caseworker here today. BS 157 this am. 80 units of Levemir, 1000 mg of Metformin and Novolog with meals of 26 units with meals plus sliding scale. Cut out snacks. Has been walking dialy.  She has cut out snacks. She is drinking more water. She still gets cravings for sweets at times. Caseworker is here with her today. Needs cooking tools to bake and broil foods. GOing to the grocery store to buy items for breakfast and lunch Most recent A1C down to 10.9% from 14%.   Preferred Learning Style  Auditory  Visual  Hands on   Learning Readiness:  Ready  Change in progress   MEDICATIONS: See list  DIETARY INTAKE:  24-hr recall:  B ( AM): 1 egg, 1 slice toast, water. Snk ( AM): none  L ( PM): Fish baked, and  Mashed potattoes, waterSnk ( PM): D ( PM):  Tv dinner . water Snk ( PM): anything; usually snacks late at night. Beverages: water, unsweet tea,  Usual physical activity: none.  Estimated energy needs: 1500 calories 170 g carbohydrates 112 g protein 42 g fat  Progress Towards Goal(s):  In progress.   Nutritional Diagnosis:  NB-1.1 Food and nutrition-related knowledge deficit As related to Diabetes.  As evidenced by A1C 10.7%.    Intervention:  Nutrition counseling meal planning and My Plate for better balanced meals. Reviewed target ranges for blood sugars and prevention of complications.  Plan:  Goal. Cut out diet sodas and try to just more water.. Add more vegetables and fresh fruit. Eat 1 egg and 2 pieces of toast for breakfast OR 1 bowl of cherrios, 1 cup milk and 1 egg. Do not skip meals. Increase fresh fruits and low carb meals for improved blood sugar. Start exercising 30 minutes daily. Take medications as prescribed. Lose 1 lb per week til next visit. Get A1C down to 8% in three months  Teaching Method Utilized:  Visual Auditory Hands on  Handouts given during visit include: The Plate Method Meal Plan Card  Barriers to learning/adherence to lifestyle change: mental handicap  Demonstrated degree of understanding via:  Teach Back   Monitoring/Evaluation:  Dietary intake, exercise, meal planning, and body weight in 1- 2 month(s) for more detailed diabetes education and improved DM.

## 2015-01-01 ENCOUNTER — Other Ambulatory Visit: Payer: Self-pay | Admitting: "Endocrinology

## 2015-01-02 ENCOUNTER — Ambulatory Visit: Payer: Medicare Other | Admitting: Nutrition

## 2015-01-07 ENCOUNTER — Encounter: Payer: Medicare Other | Attending: "Endocrinology | Admitting: Nutrition

## 2015-01-07 ENCOUNTER — Encounter: Payer: Self-pay | Admitting: Nutrition

## 2015-01-07 VITALS — Ht 62.0 in | Wt 220.4 lb

## 2015-01-07 DIAGNOSIS — E1165 Type 2 diabetes mellitus with hyperglycemia: Secondary | ICD-10-CM | POA: Insufficient documentation

## 2015-01-07 DIAGNOSIS — Z6841 Body Mass Index (BMI) 40.0 and over, adult: Secondary | ICD-10-CM | POA: Insufficient documentation

## 2015-01-07 DIAGNOSIS — Z794 Long term (current) use of insulin: Secondary | ICD-10-CM | POA: Insufficient documentation

## 2015-01-07 DIAGNOSIS — Z713 Dietary counseling and surveillance: Secondary | ICD-10-CM | POA: Insufficient documentation

## 2015-01-07 DIAGNOSIS — E118 Type 2 diabetes mellitus with unspecified complications: Secondary | ICD-10-CM

## 2015-01-07 DIAGNOSIS — IMO0002 Reserved for concepts with insufficient information to code with codable children: Secondary | ICD-10-CM

## 2015-01-07 NOTE — Patient Instructions (Signed)
Plan: Follow Plate Method Goal. Add more vegetables  Eat 1 egg and 2 pieces of toast for breakfast OR 1 bowl of cherrios, 1 cup milk and 1 egg. Do not skip meals. Reduce fruit to 1 serving per meal. Lose 1 lb per week til next visit. Get A1C down to 8% in three months Get a watch and walk for 30 minutes per day.

## 2015-01-07 NOTE — Progress Notes (Signed)
  Medical Nutrition Therapy:  Appt start time: 1100 end time:  1130.  Assessment:  Primary concerns today: Diabetes. Follow up  Caseworker here with her today.  Got more water and drinking cystal light now instead of diet sodas. Taking 80 units of Levemir,, Metformin 1000 mg BID , Invokana and Onglyza(?).  Started walking yesterday. Gained 10 lbs since last visit three months ago. Denies over eating. Willing to start exercising. Has been overating with more carbs per meal than recommended which accounts for elevated blood sugars. Meter brought in. AVG 281 mg/dl.. BS log reports she is correctly  Dosing sliding scale insulin. Suggested she get a watch to time herself to walk 30 minutes and get a scale to weigh at home to track her weight for greater compliance with diet and weight loss needs. Needs more fresh fruits and vegetables. Has candy and some sweets at the house. Has been trying to cook the Healthy Choice and Smart One Meals at times.   No recent A1C. Last A1C 10.9%.  Lab Results  Component Value Date   HGBA1C 14.1* 02/02/2013     Wt Readings from Last 3 Encounters:  01/07/15 220 lb 6.4 oz (99.973 kg)  10/04/14 210 lb 6.4 oz (95.437 kg)  07/23/14 209 lb (94.802 kg)   Ht Readings from Last 3 Encounters:  01/07/15 '5\' 2"'$  (1.575 m)  10/04/14 '5\' 2"'$  (1.575 m)  07/23/14 '5\' 2"'$  (1.575 m)   Body mass index is 40.3 kg/(m^2).    Preferred Learning Style  Auditory  Visual  Hands on   Learning Readiness:  Ready  Change in progress   MEDICATIONS: See list  DIETARY INTAKE:  24-hr recall:  B ( AM): TV dinner chicken, gravy and vegetables, water Snk ( AM): none  L ( PM): Ham sandwich on brown bread, mayo and mustand, banana and orage. Green beans, : D ( PM):  Tv dinner . water Snk ( PM):Beverages: water, unsweet tea,  Usual physical activity: none.  Estimated energy needs: 1500 calories 170 g carbohydrates 112 g protein 42 g fat  Progress Towards Goal(s):  In  progress.   Nutritional Diagnosis:  NB-1.1 Food and nutrition-related knowledge deficit As related to Diabetes.  As evidenced by A1C 10.7%.    Intervention:  Nutrition counseling meal planning and My Plate for better balanced meals. Reviewed target ranges for blood sugars and prevention of complications.  Plan: Follow Plate Method Goal. Add more vegetables  Eat 1 egg and 2 pieces of toast for breakfast OR 1 bowl of cherrios, 1 cup milk and 1 egg. Do not skip meals. Reduce fruit to 1 serving per meal. Lose 1 lb per week til next visit. Get A1C down to 8% in three months Get a watch and walk for 30 minutes per day. Get a scale and weigh self weekly.   Teaching Method Utilized: Visual Auditory Hands on  Handouts given during visit include: The Plate Method Meal Plan Card  Barriers to learning/adherence to lifestyle change: mental handicap  Demonstrated degree of understanding via:  Teach Back   Monitoring/Evaluation:  Dietary intake, exercise, meal planning, and body weight in 1- 2 month(s) for more detailed diabetes education and improved DM.

## 2015-01-21 ENCOUNTER — Other Ambulatory Visit: Payer: Self-pay | Admitting: "Endocrinology

## 2015-01-31 ENCOUNTER — Ambulatory Visit: Payer: Self-pay | Admitting: "Endocrinology

## 2015-02-12 LAB — HEMOGLOBIN A1C: Hgb A1c MFr Bld: 10.6 % — AB (ref 4.0–6.0)

## 2015-02-19 ENCOUNTER — Other Ambulatory Visit: Payer: Self-pay | Admitting: "Endocrinology

## 2015-02-26 ENCOUNTER — Ambulatory Visit (INDEPENDENT_AMBULATORY_CARE_PROVIDER_SITE_OTHER): Payer: Medicare Other | Admitting: "Endocrinology

## 2015-02-26 ENCOUNTER — Encounter: Payer: Self-pay | Admitting: "Endocrinology

## 2015-02-26 VITALS — Ht 62.5 in

## 2015-02-26 DIAGNOSIS — I10 Essential (primary) hypertension: Secondary | ICD-10-CM | POA: Diagnosis not present

## 2015-02-26 DIAGNOSIS — E1122 Type 2 diabetes mellitus with diabetic chronic kidney disease: Secondary | ICD-10-CM | POA: Diagnosis not present

## 2015-02-26 DIAGNOSIS — E1165 Type 2 diabetes mellitus with hyperglycemia: Secondary | ICD-10-CM

## 2015-02-26 DIAGNOSIS — Z794 Long term (current) use of insulin: Secondary | ICD-10-CM | POA: Diagnosis not present

## 2015-02-26 DIAGNOSIS — N182 Chronic kidney disease, stage 2 (mild): Secondary | ICD-10-CM

## 2015-02-26 DIAGNOSIS — N184 Chronic kidney disease, stage 4 (severe): Secondary | ICD-10-CM | POA: Insufficient documentation

## 2015-02-26 MED ORDER — INSULIN DETEMIR 100 UNIT/ML FLEXPEN: 80.0000 [IU] | PEN_INJECTOR | Freq: Every day | SUBCUTANEOUS | Status: DC

## 2015-02-26 MED ORDER — METFORMIN HCL 500 MG PO TABS: 500.0000 mg | ORAL_TABLET | Freq: Two times a day (BID) | ORAL | Status: DC

## 2015-02-26 MED ORDER — INSULIN LISPRO 200 UNIT/ML ~~LOC~~ SOPN: 30.0000 [IU] | PEN_INJECTOR | Freq: Three times a day (TID) | SUBCUTANEOUS | Status: DC

## 2015-02-26 NOTE — Patient Instructions (Signed)

## 2015-02-26 NOTE — Progress Notes (Signed)
Subjective:    Patient ID: [name], female    DOB: 1955/07/07, PCP Lanette Hampshire, MD   Past Medical History  Diagnosis Date  . Hypertension   . Iron deficiency anemia 10/16/2010  . Diabetes mellitus without complication ([place])   . CHF (congestive heart failure) ([place])   . Mental handicap 10/16/2010   Past Surgical History  Procedure Laterality Date  . Cholecystectomy    . Abdominal hysterectomy    . Knee surgery      right knee @ 59 years of age  . Cesarean section    . Colonoscopy  08/2010    normal TI, sigmoid polyp (adenoma ). Next TCS due  08/2015,  . Esophagogastroduodenoscopy  08/2010    antral and duodenal erosions s/p bx (chronic gastritis, no h.pylori, no celiac dz ), hiatal hernia  . Left and right heart catheterization with coronary angiogram N/A 09/21/2011    Procedure: LEFT AND RIGHT HEART CATHETERIZATION WITH CORONARY ANGIOGRAM;  Surgeon: Birdie Riddle, MD;  Location: [facility];  Service: Cardiovascular;  Laterality: N/A;   Social History   Social History  . Marital Status: Divorced    Spouse Name: N/A  . Number of Children: 1  . Years of Education: N/A   Occupational History  .     Social History Main Topics  . Smoking status: Former Smoker -- 1.00 packs/day for 0 years    Types: Cigarettes  . Smokeless tobacco: Never Used     Comment: quit a couple year ago  . Alcohol Use: No  . Drug Use: No  . Sexual Activity: No   Other Topics Concern  . None   Social History Narrative   Lives with parents.    Outpatient Encounter Prescriptions as of 02/26/2015  Medication Sig  . metFORMIN (GLUCOPHAGE) 500 MG tablet Take 1 tablet (500 mg total) by mouth 2 (two) times daily with a meal.  . [DISCONTINUED] metFORMIN (GLUMETZA) 1000 MG (MOD) 24 hr tablet Take 500 mg by mouth 2 (two) times daily with a meal.  . ACCU-CHEK AVIVA PLUS test strip USE TO TEST FOUR TIMES DAILY.  Marland Kitchen aspirin 81 MG tablet Take 81 mg by mouth once.  . cefdinir (OMNICEF) 300 MG  capsule Take 300 mg by mouth 2 (two) times daily.  . Choline Fenofibrate (FENOFIBRIC ACID) 135 MG CPDR Take 1 capsule by mouth daily.  . digoxin (LANOXIN) 0.125 MG tablet Take 0.125 mg by mouth daily.  . furosemide (LASIX) 40 MG tablet Take 40 mg by mouth daily.  Marland Kitchen HYDROcodone-homatropine (HYCODAN) 5-1.5 MG/5ML syrup Take 5 mLs by mouth every 4 (four) hours as needed for cough.  . Insulin Detemir (LEVEMIR FLEXTOUCH) 100 UNIT/ML Pen Inject 80 Units into the skin daily at 10 pm.  . Insulin Lispro (HUMALOG KWIKPEN) 200 UNIT/ML SOPN Inject 30-36 Units into the skin 3 (three) times daily before meals.  . isosorbide-hydrALAZINE (BIDIL) 20-37.5 MG per tablet Take 1 tablet by mouth 3 (three) times daily.  Marland Kitchen lisinopril (PRINIVIL,ZESTRIL) 20 MG tablet Take 20 mg by mouth daily.  . [DISCONTINUED] canagliflozin (INVOKANA) 100 MG TABS tablet Take 100 mg by mouth.  . [DISCONTINUED] HUMALOG KWIKPEN 200 UNIT/ML SOPN INJECT 20 UNITS INTO THE SKIN THREE TIMES DAILY.  . [DISCONTINUED] LEVEMIR FLEXTOUCH 100 UNIT/ML Pen INJECT 80 UNITS SUBCUTANEOUSLY AT BEDTIME.  . [DISCONTINUED] saxagliptin HCl (ONGLYZA) 5 MG TABS tablet Take 5 mg by mouth daily.   No facility-administered encounter medications on file as of 02/26/2015.  ALLERGIES: No Known Allergies VACCINATION STATUS: Immunization History  Administered Date(s) Administered  . Influenza,inj,Quad PF,36+ Mos 02/02/2013    Diabetes She presents for her follow-up diabetic visit. She has type 2 diabetes mellitus. Onset time: she was diagnosed at approximate age of 62 years. Her disease course has been worsening. There are no hypoglycemic associated symptoms. Associated symptoms include blurred vision, polydipsia and polyuria. There are no hypoglycemic complications. Symptoms are worsening. Diabetic complications include nephropathy. Risk factors for coronary artery disease include diabetes mellitus, dyslipidemia, hypertension, obesity and sedentary lifestyle.  Current diabetic treatment includes intensive insulin program. She is following a generally unhealthy diet. She has had a previous visit with a dietitian. She never participates in exercise. Her breakfast blood glucose range is generally >200 mg/dl. Her lunch blood glucose range is generally >200 mg/dl. Her dinner blood glucose range is generally >200 mg/dl. Her highest blood glucose is >200 mg/dl. Her overall blood glucose range is >200 mg/dl. An ACE inhibitor/angiotensin II receptor blocker is being taken.  Hypertension This is a chronic problem. The current episode started more than 1 year ago. The problem is controlled. Associated symptoms include blurred vision. Risk factors for coronary artery disease include diabetes mellitus, dyslipidemia, obesity and sedentary lifestyle. Past treatments include ACE inhibitors.     Review of Systems  Eyes: Positive for blurred vision.  Endocrine: Positive for polydipsia and polyuria.    Objective:    Ht 5' 2.5" (1.588 m)  Wt Readings from Last 3 Encounters:  01/07/15 220 lb 6.4 oz (99.973 kg)  10/04/14 210 lb 6.4 oz (95.437 kg)  07/23/14 209 lb (94.802 kg)    Physical Exam  Results for orders placed or performed in visit on 02/26/15  Hemoglobin A1c  Result Value Ref Range   Hgb A1c MFr Bld 10.6 (A) 4.0 - 6.0 %   Complete Blood Count (Most recent): Lab Results  Component Value Date   WBC 8.4 02/02/2013   HGB 11.0* 02/02/2013   HCT 32.7* 02/02/2013   MCV 89.1 02/02/2013   PLT 254 02/02/2013   Chemistry (most recent): Lab Results  Component Value Date   NA 136 02/04/2013   K 4.3 02/04/2013   CL 101 02/04/2013   CO2 25 02/04/2013   BUN 50* 02/04/2013   CREATININE 2.11* 02/04/2013   Diabetic Labs (most recent): Lab Results  Component Value Date   HGBA1C 10.6* 02/12/2015   HGBA1C 14.1* 02/02/2013   Lipid profile (most recent): No results found for: TRIG, CHOL       Assessment & Plan:   1. Type 2 diabetes mellitus with  stage 2 chronic kidney disease, with long-term current use of insulin (HCC)   -Her diabetes is  complicated by CKD and patient remains at a high risk for more acute and chronic complications of diabetes which include CAD, CVA, CKD, retinopathy, and neuropathy. These are all discussed in detail with the patient.  Patient came with persistently elevated glucose profile despite large dose of insulin, and  recent A1c of 10.6 %.  Glucose logs and insulin administration records pertaining to this visit,  to be scanned into patient's records.   There is a concern that she may not be injecting her insulin due to fear of hypoglycemia. Recent labs reviewed.   - I have re-counseled the patient on diet management and weight loss  by adopting a carbohydrate restricted / protein rich  Diet.  - Suggestion is made for patient to avoid simple carbohydrates   from their diet including  Cakes , Desserts, Ice Cream,  Soda (  diet and regular) , Sweet Tea , Candies,  Chips, Cookies, Artificial Sweeteners,   and "Sugar-free" Products .  This will help patient to have stable blood glucose profile and potentially avoid unintended  Weight gain.  - Patient is advised to stick to a routine mealtimes to eat 3 meals  a day and avoid unnecessary snacks ( to snack only to correct hypoglycemia).  - The patient  Has been   scheduled with Jearld Fenton, RDN, CDE for individualized DM education.  - I have approached patient with the following individualized plan to manage diabetes and patient agrees. -there   Is a a big concern that she may not be taking her insulin for fear of hypoglycemia, no needle marks on her skin.  -Continue Levemir 80 units QAM , increase Humalog to 30 units TIDAC plus correction dose..  -Continue Metformin '500mg'$  po BID  - Patient will be considered for incretin therapy as appropriate next visit. - Patient specific target  for A1c; LDL, HDL, Triglycerides, and  Waist Circumference were discussed in  detail.  2) BP/HTN: controlled. Continue current medications including ACEI/ARB. 3) Lipids/HPL:  continue statins. 4)  Weight/Diet: CDE consult in progress, exercise, and carbohydrates information provided.  5) Chronic Care/Health Maintenance:  -Patient is  on ACEI and encouraged to continue to follow up with Ophthalmology, Podiatrist at least yearly or according to recommendations, and advised to stay away from smoking. I have recommended yearly flu vaccine and pneumonia vaccination at least every 5 years; moderate intensity exercise for up to 150 minutes weekly; and  sleep for at least 7 hours a day. -she has serious non compliance to dietary recomendations.    - 25 minutes of time was spent on the care of this patient , 50% of which was applied for counseling on diabetes complications and their preventions.  - I advised patient to maintain close follow up with Lanette Hampshire, MD for primary care needs.  Patient is asked to bring meter and  blood glucose logs during their next visit.   Follow up plan: -Return in about 2 weeks (around 03/12/2015) for diabetes, high blood pressure, high cholesterol, follow up with meter and logs- no labs.  Glade Lloyd, MD Phone: [phone]  Fax: [phone]   02/26/2015, 6:53 PM

## 2015-03-07 ENCOUNTER — Other Ambulatory Visit: Payer: Self-pay | Admitting: "Endocrinology

## 2015-03-07 ENCOUNTER — Encounter: Payer: Medicare Other | Attending: "Endocrinology | Admitting: Nutrition

## 2015-03-07 ENCOUNTER — Encounter: Payer: Self-pay | Admitting: Nutrition

## 2015-03-07 VITALS — Ht 62.0 in | Wt 220.0 lb

## 2015-03-07 DIAGNOSIS — Z6841 Body Mass Index (BMI) 40.0 and over, adult: Secondary | ICD-10-CM | POA: Diagnosis not present

## 2015-03-07 DIAGNOSIS — E1165 Type 2 diabetes mellitus with hyperglycemia: Secondary | ICD-10-CM | POA: Diagnosis not present

## 2015-03-07 DIAGNOSIS — Z713 Dietary counseling and surveillance: Secondary | ICD-10-CM | POA: Diagnosis not present

## 2015-03-07 DIAGNOSIS — Z794 Long term (current) use of insulin: Secondary | ICD-10-CM | POA: Diagnosis not present

## 2015-03-07 DIAGNOSIS — E118 Type 2 diabetes mellitus with unspecified complications: Secondary | ICD-10-CM

## 2015-03-07 DIAGNOSIS — IMO0002 Reserved for concepts with insufficient information to code with codable children: Secondary | ICD-10-CM

## 2015-03-07 NOTE — Progress Notes (Signed)
  Medical Nutrition Therapy:  Appt start time: 1100 end time:  1130.  Assessment:  Primary concerns today: Diabetes. Follow up   Came by herself but her social worker brought her.. Her mom is the hospital. FBS 133 mg/dl. 200 mg/dl yesterday am. [facility] or [employer] ones but says its too spicy food.  She has been walking more. DIdn't get a scale. Hasn't really changed much with her diet and food choices. Didn't bring meter or BS log. Wt is the same as last visit. She notes she has Congestive Heart Failure and that is what makes her BS go up and fluid overload. Cognitive function may limit her in following through with recommendations of diet and exercise. She didn't get a scale to weigh herself daily which is recommended for her CHF along with her DM. Didn't get a watch to monitor her time of exercise either. She offers a lot of excuses as to why she can't do what has been recommended. May have a fear of hypoglycemia and doesn't like it when BS are in the 100's. Diet is still inconsistent and not meeting her needs for good blood sugar control and needed weight loss. Doees't want to give up her bacon or sausage.   Lab Results  Component Value Date   HGBA1C 10.6* 02/12/2015   .last Wt Readings from Last 3 Encounters:  03/07/15 220 lb (99.791 kg)  01/07/15 220 lb 6.4 oz (99.973 kg)  10/04/14 210 lb 6.4 oz (95.437 kg)   Ht Readings from Last 3 Encounters:  03/07/15 '5\' 2"'$  (1.575 m)  02/26/15 5' 2.5" (1.588 m)  01/07/15 '5\' 2"'$  (1.575 m)   Body mass index is 40.23 kg/(m^2).    Preferred Learning Style  Auditory  Visual  Hands on   Learning Readiness:  Ready  Change in progress   MEDICATIONS: See list  DIETARY INTAKE:  24-hr recall:  B ( AM): Hfasn't eaten anything. 1 cup 2% Snk ( AM): none  L ( PM): [place], french fries, 1 slice bread, D ( PM):  Can't remember. Snk ( PM):Beverages: water, unsweet tea,  Usual physical activity: none.  Estimated energy  needs: 1500 calories 170 g carbohydrates 112 g protein 42 g fat  Progress Towards Goal(s):  In progress.   Nutritional Diagnosis:  NB-1.1 Food and nutrition-related knowledge deficit As related to Diabetes.  As evidenced by A1C 10.7%.    Intervention:  Nutrition counseling meal planning and My Plate for better balanced meals. Reviewed target ranges for blood sugars and prevention of complications.  Plan: Follow Plate Method Goal. Add more vegetables  Eat 1 egg and 2 pieces of toast for breakfast OR 1 bowl of cherrios, 1 cup milk and 1 egg. Do not skip meals. Reduce fruit to 1 serving per meal. Lose 1 lb per week til next visit. Get A1C down to 8% in three months Get a watch and walk for 30 minutes per day. Get a scale and weigh self weekly.   Teaching Method Utilized: Visual Auditory Hands on  Handouts given during visit include: The Plate Method Meal Plan Card  Barriers to learning/adherence to lifestyle change: mental handicap  Demonstrated degree of understanding via:  Teach Back   Monitoring/Evaluation:  Dietary intake, exercise, meal planning, and body weight in 1- 2 month(s) for more detailed diabetes education and improved DM.

## 2015-03-07 NOTE — Patient Instructions (Signed)
Plan: Follow Plate Method Goal. Add more vegetables  Eat 1 egg and 2 pieces of toast for breakfast OR 1 bowl of cherrios, 1 cup milk and 1 egg. Do not skip meals. Reduce fruit to 1 serving per meal. Lose 1 lb per week til next visit. Get A1C down to 8% in three months Get a watch and walk for 30 minutes per day. Get a scale and weigh self weekly.

## 2015-03-18 ENCOUNTER — Ambulatory Visit: Payer: Medicare Other | Admitting: "Endocrinology

## 2015-04-04 ENCOUNTER — Telehealth: Payer: Self-pay | Admitting: "Endocrinology

## 2015-04-04 NOTE — Telephone Encounter (Signed)
[facility] is requesting a doctor's note for Home Health to make visits to see [name] and help her with her diabetes. They do not want to put her in assisted living if they can get her help. Her social worker is [place] @ [phone].

## 2015-04-22 ENCOUNTER — Telehealth: Payer: Self-pay

## 2015-04-22 NOTE — Telephone Encounter (Signed)
[facility] is requesting a doctor's note for Home Health to make visits to see [name] and help her with her diabetes. They do not want to put her in assisted living if they can get her help. Her social worker is [place] @ [phone]. She states this can be written on a prescription and faxed to her.

## 2015-04-24 ENCOUNTER — Other Ambulatory Visit: Payer: Self-pay | Admitting: "Endocrinology

## 2015-04-24 NOTE — Telephone Encounter (Signed)
Order faxed.

## 2015-04-30 ENCOUNTER — Ambulatory Visit: Payer: Medicare Other | Admitting: "Endocrinology

## 2015-05-06 ENCOUNTER — Ambulatory Visit (INDEPENDENT_AMBULATORY_CARE_PROVIDER_SITE_OTHER): Payer: Medicare Other | Admitting: "Endocrinology

## 2015-05-06 ENCOUNTER — Encounter: Payer: Self-pay | Admitting: "Endocrinology

## 2015-05-06 VITALS — BP 124/82 | HR 94 | Ht 62.0 in | Wt 231.0 lb

## 2015-05-06 DIAGNOSIS — E1122 Type 2 diabetes mellitus with diabetic chronic kidney disease: Secondary | ICD-10-CM | POA: Diagnosis not present

## 2015-05-06 DIAGNOSIS — N182 Chronic kidney disease, stage 2 (mild): Secondary | ICD-10-CM

## 2015-05-06 DIAGNOSIS — Z794 Long term (current) use of insulin: Secondary | ICD-10-CM

## 2015-05-06 DIAGNOSIS — I10 Essential (primary) hypertension: Secondary | ICD-10-CM | POA: Diagnosis not present

## 2015-05-06 MED ORDER — INSULIN DETEMIR 100 UNIT/ML FLEXPEN
60.0000 [IU] | PEN_INJECTOR | Freq: Two times a day (BID) | SUBCUTANEOUS | Status: DC
Start: 2015-05-06 — End: 2015-08-07

## 2015-05-06 NOTE — Patient Instructions (Signed)

## 2015-05-06 NOTE — Progress Notes (Signed)
Subjective:    Patient ID: [name], female    DOB: June 05, 1955, PCP Anne Hampshire, MD   Past Medical History  Diagnosis Date  . Hypertension   . Iron deficiency anemia 10/16/2010  . Diabetes mellitus without complication ([place])   . CHF (congestive heart failure) ([place])   . Mental handicap 10/16/2010   Past Surgical History  Procedure Laterality Date  . Cholecystectomy    . Abdominal hysterectomy    . Knee surgery      right knee @ 60 years of age  . Cesarean section    . Colonoscopy  08/2010    normal TI, sigmoid polyp (adenoma ). Next TCS due  08/2015,  . Esophagogastroduodenoscopy  08/2010    antral and duodenal erosions s/p bx (chronic gastritis, no h.pylori, no celiac dz ), hiatal hernia  . Left and right heart catheterization with coronary angiogram N/A 09/21/2011    Procedure: LEFT AND RIGHT HEART CATHETERIZATION WITH CORONARY ANGIOGRAM;  Surgeon: Birdie Riddle, MD;  Location: [facility];  Service: Cardiovascular;  Laterality: N/A;   Social History   Social History  . Marital Status: Divorced    Spouse Name: N/A  . Number of Children: 1  . Years of Education: N/A   Occupational History  .     Social History Main Topics  . Smoking status: Former Smoker -- 1.00 packs/day for 0 years    Types: Cigarettes  . Smokeless tobacco: Never Used     Comment: quit a couple year ago  . Alcohol Use: No  . Drug Use: No  . Sexual Activity: No   Other Topics Concern  . None   Social History Narrative   Lives with parents.    Outpatient Encounter Prescriptions as of 05/06/2015  Medication Sig  . ACCU-CHEK AVIVA PLUS test strip USE TO TEST FOUR TIMES DAILY.  Marland Kitchen aspirin 81 MG tablet Take 81 mg by mouth once.  . cefdinir (OMNICEF) 300 MG capsule Take 300 mg by mouth 2 (two) times daily.  . Choline Fenofibrate (FENOFIBRIC ACID) 135 MG CPDR Take 1 capsule by mouth daily.  . digoxin (LANOXIN) 0.125 MG tablet Take 0.125 mg by mouth daily.  . furosemide (LASIX) 40 MG  tablet Take 40 mg by mouth daily.  Marland Kitchen HYDROcodone-homatropine (HYCODAN) 5-1.5 MG/5ML syrup Take 5 mLs by mouth every 4 (four) hours as needed for cough.  . Insulin Detemir (LEVEMIR FLEXTOUCH) 100 UNIT/ML Pen Inject 60 Units into the skin 2 times daily at 12 noon and 4 pm.  . Insulin Lispro (HUMALOG KWIKPEN) 200 UNIT/ML SOPN Inject 30-36 Units into the skin 3 (three) times daily before meals.  . isosorbide-hydrALAZINE (BIDIL) 20-37.5 MG per tablet Take 1 tablet by mouth 3 (three) times daily.  Marland Kitchen lisinopril (PRINIVIL,ZESTRIL) 20 MG tablet Take 20 mg by mouth daily.  . metFORMIN (GLUCOPHAGE) 500 MG tablet Take 1 tablet (500 mg total) by mouth 2 (two) times daily with a meal.  . [DISCONTINUED] LEVEMIR FLEXTOUCH 100 UNIT/ML Pen INJECT 80 UNITS SUBCUTANEOUSLY AT 10 PM.   No facility-administered encounter medications on file as of 05/06/2015.   ALLERGIES: No Known Allergies VACCINATION STATUS: Immunization History  Administered Date(s) Administered  . Influenza,inj,Quad PF,36+ Mos 02/02/2013    Diabetes She presents for her follow-up diabetic visit. She has type 2 diabetes mellitus. Onset time: she was diagnosed at approximate age of 25 years. Her disease course has been worsening. There are no hypoglycemic associated symptoms. Pertinent negatives for hypoglycemia include  no confusion, headaches, pallor or seizures. Associated symptoms include blurred vision, fatigue, polydipsia and polyuria. Pertinent negatives for diabetes include no chest pain and no polyphagia. There are no hypoglycemic complications. Symptoms are worsening. Diabetic complications include nephropathy. Risk factors for coronary artery disease include diabetes mellitus, dyslipidemia, hypertension, obesity and sedentary lifestyle. Current diabetic treatment includes intensive insulin program. She is following a generally unhealthy diet. She has had a previous visit with a dietitian. She never participates in exercise. Home blood sugar  record trend: Lately, she is skipping some mealtime insulin opportunities. Her breakfast blood glucose range is generally >200 mg/dl. Her lunch blood glucose range is generally >200 mg/dl. Her dinner blood glucose range is generally >200 mg/dl. Her highest blood glucose is >200 mg/dl. Her overall blood glucose range is >200 mg/dl. An ACE inhibitor/angiotensin II receptor blocker is being taken.  Hypertension This is a chronic problem. The current episode started more than 1 year ago. The problem is controlled. Associated symptoms include blurred vision. Pertinent negatives include no chest pain, headaches, palpitations or shortness of breath. Risk factors for coronary artery disease include diabetes mellitus, dyslipidemia, obesity and sedentary lifestyle. Past treatments include ACE inhibitors.     Review of Systems  Constitutional: Positive for fatigue and unexpected weight change. Negative for fever and chills.  HENT: Negative for trouble swallowing and voice change.   Eyes: Positive for blurred vision. Negative for visual disturbance.  Respiratory: Negative for cough, shortness of breath and wheezing.   Cardiovascular: Negative for chest pain, palpitations and leg swelling.  Gastrointestinal: Negative for nausea, vomiting and diarrhea.  Endocrine: Positive for polydipsia and polyuria. Negative for cold intolerance, heat intolerance and polyphagia.  Musculoskeletal: Negative for myalgias and arthralgias.  Skin: Negative for color change, pallor, rash and wound.  Neurological: Negative for seizures and headaches.  Psychiatric/Behavioral: Negative for suicidal ideas and confusion.    Objective:    BP 124/82 mmHg  Pulse 94  Ht '5\' 2"'$  (1.575 m)  Wt 231 lb (104.781 kg)  BMI 42.24 kg/m2  SpO2 97%  Wt Readings from Last 3 Encounters:  05/06/15 231 lb (104.781 kg)  03/07/15 220 lb (99.791 kg)  01/07/15 220 lb 6.4 oz (99.973 kg)    Physical Exam  Constitutional: She is oriented to person,  place, and time. She appears well-developed.  HENT:  Head: Normocephalic and atraumatic.  Eyes: EOM are normal.  Neck: Normal range of motion. Neck supple. No tracheal deviation present. No thyromegaly present.  Cardiovascular: Normal rate and regular rhythm.   Pulmonary/Chest: Effort normal and breath sounds normal.  Abdominal: Soft. Bowel sounds are normal. There is no tenderness. There is no guarding.  Musculoskeletal: Normal range of motion. She exhibits no edema.  Neurological: She is alert and oriented to person, place, and time. She has normal reflexes. No cranial nerve deficit. Coordination normal.  Skin: Skin is warm and dry. No rash noted. No erythema. No pallor.  Psychiatric: She has a normal mood and affect. Judgment normal.    Results for orders placed or performed in visit on 02/26/15  Hemoglobin A1c  Result Value Ref Range   Hgb A1c MFr Bld 10.6 (A) 4.0 - 6.0 %   Complete Blood Count (Most recent): Lab Results  Component Value Date   WBC 8.4 02/02/2013   HGB 11.0* 02/02/2013   HCT 32.7* 02/02/2013   MCV 89.1 02/02/2013   PLT 254 02/02/2013   Chemistry (most recent): Lab Results  Component Value Date   NA 136 02/04/2013  K 4.3 02/04/2013   CL 101 02/04/2013   CO2 25 02/04/2013   BUN 50* 02/04/2013   CREATININE 2.11* 02/04/2013   Diabetic Labs (most recent): Lab Results  Component Value Date   HGBA1C 10.6* 02/12/2015   HGBA1C 14.1* 02/02/2013     Assessment & Plan:   1. Type 2 diabetes mellitus with stage 2 chronic kidney disease, with long-term current use of insulin (HCC)   -Her diabetes is  complicated by CKD and patient remains at a high risk for more acute and chronic complications of diabetes which include CAD, CVA, CKD, retinopathy, and neuropathy. These are all discussed in detail with the patient.  Patient came with persistently elevated glucose profile despite large dose of insulin, and  recent A1c of 10.6 %.  Glucose logs and insulin  administration records pertaining to this visit,  to be scanned into patient's records.   There is a concern that she may not be injecting her insulin due to fear of hypoglycemia. Recent labs reviewed.   - I have re-counseled the patient on diet management and weight loss  by adopting a carbohydrate restricted / protein rich  Diet.  - Suggestion is made for patient to avoid simple carbohydrates   from their diet including Cakes , Desserts, Ice Cream,  Soda (  diet and regular) , Sweet Tea , Candies,  Chips, Cookies, Artificial Sweeteners,   and "Sugar-free" Products .  This will help patient to have stable blood glucose profile and potentially avoid unintended  Weight gain.  - Patient is advised to stick to a routine mealtimes to eat 3 meals  a day and avoid unnecessary snacks ( to snack only to correct hypoglycemia).  - The patient  Has been   scheduled with Jearld Fenton, RDN, CDE for individualized DM education.  - I have approached patient with the following individualized plan to manage diabetes and patient agrees. -there   Is a a big concern that she may not be taking her insulin for fear of hypoglycemia, no needle marks on her skin.  -Increase  Levemir  to 60 units twice a day , continue Humalog  30 units TIDAC plus correction dose. - At this point is clear that this patient may not be Able to bring this intensive insulin treatment made worse by her excessive fear of hypoglycemia. She would definitely benefit from either live-in aid  or placement in group home or assisted living facility. -I shared my concern with her social worker in the exam room. -Continue Metformin '500mg'$  po BID.   - Patient specific target  for A1c; LDL, HDL, Triglycerides, and  Waist Circumference were discussed in detail.  2) BP/HTN: controlled. Continue current medications including ACEI/ARB. 3) Lipids/HPL:  continue statins. 4)  Weight/Diet: CDE consult in progress, exercise, and carbohydrates information  provided.  5) Chronic Care/Health Maintenance:  -Patient is  on ACEI and encouraged to continue to follow up with Ophthalmology, Podiatrist at least yearly or according to recommendations, and advised to stay away from smoking. I have recommended yearly flu vaccine and pneumonia vaccination at least every 5 years; moderate intensity exercise for up to 150 minutes weekly; and  sleep for at least 7 hours a day. -she has serious non compliance to dietary recomendations.    - 25 minutes of time was spent on the care of this patient , 50% of which was applied for counseling on diabetes complications and their preventions.  - I advised patient to maintain close follow up with  Anne Hampshire, MD for primary care needs.  Patient is asked to bring meter and  blood glucose logs during their next visit.   Follow up plan: -Return in about 8 weeks (around 07/01/2015) for diabetes, high blood pressure, high cholesterol, follow up with pre-visit labs, meter, and logs.  Glade Lloyd, MD Phone: [phone]  Fax: [phone]   05/06/2015, 4:35 PM

## 2015-05-08 ENCOUNTER — Encounter: Payer: Medicare Other | Admitting: Nutrition

## 2015-05-17 ENCOUNTER — Other Ambulatory Visit: Payer: Self-pay | Admitting: "Endocrinology

## 2015-06-18 ENCOUNTER — Other Ambulatory Visit: Payer: Self-pay | Admitting: "Endocrinology

## 2015-06-24 ENCOUNTER — Other Ambulatory Visit: Payer: Self-pay | Admitting: "Endocrinology

## 2015-06-24 LAB — HEMOGLOBIN A1C
Hgb A1c MFr Bld: 9.6 % — ABNORMAL HIGH (ref <5.7)
Mean Plasma Glucose: 229 mg/dL

## 2015-06-24 LAB — BASIC METABOLIC PANEL
BUN: 39 mg/dL — ABNORMAL HIGH (ref 7–25)
CO2: 24 mmol/L (ref 20–31)
Calcium: 8.8 mg/dL (ref 8.6–10.4)
Chloride: 103 mmol/L (ref 98–110)
Creat: 1.02 mg/dL (ref 0.50–1.05)
Glucose, Bld: 224 mg/dL — ABNORMAL HIGH (ref 65–99)
Potassium: 4.4 mmol/L (ref 3.5–5.3)
Sodium: 139 mmol/L (ref 135–146)

## 2015-06-24 LAB — TSH: TSH: 1.6 mIU/L

## 2015-07-02 ENCOUNTER — Ambulatory Visit (INDEPENDENT_AMBULATORY_CARE_PROVIDER_SITE_OTHER): Payer: Medicare Other | Admitting: "Endocrinology

## 2015-07-02 ENCOUNTER — Encounter: Payer: Self-pay | Admitting: "Endocrinology

## 2015-07-02 VITALS — BP 140/83 | HR 91 | Ht 62.0 in | Wt 234.0 lb

## 2015-07-02 DIAGNOSIS — Z794 Long term (current) use of insulin: Secondary | ICD-10-CM | POA: Diagnosis not present

## 2015-07-02 DIAGNOSIS — I10 Essential (primary) hypertension: Secondary | ICD-10-CM | POA: Diagnosis not present

## 2015-07-02 DIAGNOSIS — E1122 Type 2 diabetes mellitus with diabetic chronic kidney disease: Secondary | ICD-10-CM | POA: Diagnosis not present

## 2015-07-02 DIAGNOSIS — N182 Chronic kidney disease, stage 2 (mild): Secondary | ICD-10-CM

## 2015-07-02 MED ORDER — INSULIN LISPRO 200 UNIT/ML ~~LOC~~ SOPN: 30.0000 [IU] | PEN_INJECTOR | Freq: Three times a day (TID) | SUBCUTANEOUS | Status: DC

## 2015-07-02 NOTE — Progress Notes (Signed)
Subjective:    Patient ID: [name], female    DOB: 10-May-1955, PCP Lanette Hampshire, MD   Past Medical History  Diagnosis Date  . Hypertension   . Iron deficiency anemia 10/16/2010  . Diabetes mellitus without complication ([place])   . CHF (congestive heart failure) ([place])   . Mental handicap 10/16/2010   Past Surgical History  Procedure Laterality Date  . Cholecystectomy    . Abdominal hysterectomy    . Knee surgery      right knee @ 60 years of age  . Cesarean section    . Colonoscopy  08/2010    normal TI, sigmoid polyp (adenoma ). Next TCS due  08/2015,  . Esophagogastroduodenoscopy  08/2010    antral and duodenal erosions s/p bx (chronic gastritis, no h.pylori, no celiac dz ), hiatal hernia  . Left and right heart catheterization with coronary angiogram N/A 09/21/2011    Procedure: LEFT AND RIGHT HEART CATHETERIZATION WITH CORONARY ANGIOGRAM;  Surgeon: Birdie Riddle, MD;  Location: [facility] CATH [facility];  Service: Cardiovascular;  Laterality: N/A;   Social History   Social History  . Marital Status: Divorced    Spouse Name: N/A  . Number of Children: 1  . Years of Education: N/A   Occupational History  .     Social History Main Topics  . Smoking status: Former Smoker -- 1.00 packs/day for 0 years    Types: Cigarettes  . Smokeless tobacco: Never Used     Comment: quit a couple year ago  . Alcohol Use: No  . Drug Use: No  . Sexual Activity: No   Other Topics Concern  . None   Social History Narrative   Lives with parents.    Outpatient Encounter Prescriptions as of 07/02/2015  Medication Sig  . ACCU-CHEK AVIVA PLUS test strip USE TO TEST FOUR TIMES DAILY.  Marland Kitchen aspirin 81 MG tablet Take 81 mg by mouth once.  . B-D ULTRAFINE III SHORT PEN 31G X 8 MM MISC USE AS DIRECTED WITH LEVEMIR AND NOVOLOG. UP TO FOUR TIMES DAILY.  Marland Kitchen Choline Fenofibrate (FENOFIBRIC ACID) 135 MG CPDR Take 1 capsule by mouth daily.  . digoxin (LANOXIN) 0.125 MG tablet Take 0.125 mg by mouth daily.   . furosemide (LASIX) 40 MG tablet Take 40 mg by mouth daily.  . Insulin Detemir (LEVEMIR FLEXTOUCH) 100 UNIT/ML Pen Inject 60 Units into the skin 2 times daily at 12 noon and 4 pm.  . Insulin Lispro (HUMALOG KWIKPEN) 200 UNIT/ML SOPN Inject 30-36 Units into the skin 3 (three) times daily before meals.  . isosorbide-hydrALAZINE (BIDIL) 20-37.5 MG per tablet Take 1 tablet by mouth 3 (three) times daily.  Marland Kitchen lisinopril (PRINIVIL,ZESTRIL) 20 MG tablet Take 20 mg by mouth daily.  Marland Kitchen loratadine (CLARITIN) 10 MG tablet Take 10 mg by mouth daily.  . metFORMIN (GLUCOPHAGE) 500 MG tablet Take 1 tablet (500 mg total) by mouth 2 (two) times daily with a meal.  . [DISCONTINUED] HUMALOG KWIKPEN 200 UNIT/ML SOPN INJECT 30 TO 36 UNITS INTO THE SKIN THREE TIMES DAILY.  . [DISCONTINUED] cefdinir (OMNICEF) 300 MG capsule Take 300 mg by mouth 2 (two) times daily.  . [DISCONTINUED] HYDROcodone-homatropine (HYCODAN) 5-1.5 MG/5ML syrup Take 5 mLs by mouth every 4 (four) hours as needed for cough.   No facility-administered encounter medications on file as of 07/02/2015.   ALLERGIES: No Known Allergies VACCINATION STATUS: Immunization History  Administered Date(s) Administered  . Influenza,inj,Quad PF,36+ Mos 02/02/2013  Diabetes She presents for her follow-up diabetic visit. She has type 2 diabetes mellitus. Onset time: she was diagnosed at approximate age of 30 years. Her disease course has been worsening. There are no hypoglycemic associated symptoms. Pertinent negatives for hypoglycemia include no confusion, headaches, pallor or seizures. Associated symptoms include polydipsia and polyuria. Pertinent negatives for diabetes include no blurred vision, no chest pain, no fatigue and no polyphagia. There are no hypoglycemic complications. Symptoms are worsening. Diabetic complications include nephropathy. Risk factors for coronary artery disease include diabetes mellitus, dyslipidemia, hypertension, obesity and  sedentary lifestyle. Current diabetic treatment includes intensive insulin program. She is following a generally unhealthy diet. She has had a previous visit with a dietitian. She never participates in exercise. Home blood sugar record trend: Lately, she is skipping some mealtime insulin opportunities. Her breakfast blood glucose range is generally >200 mg/dl. Her lunch blood glucose range is generally >200 mg/dl. Her dinner blood glucose range is generally >200 mg/dl. Her highest blood glucose is >200 mg/dl. Her overall blood glucose range is >200 mg/dl. An ACE inhibitor/angiotensin II receptor blocker is being taken.  Hypertension This is a chronic problem. The current episode started more than 1 year ago. The problem is controlled. Pertinent negatives include no blurred vision, chest pain, headaches, palpitations or shortness of breath. Risk factors for coronary artery disease include diabetes mellitus, dyslipidemia, obesity and sedentary lifestyle. Past treatments include ACE inhibitors.     Review of Systems  Constitutional: Positive for unexpected weight change. Negative for fever, chills and fatigue.  HENT: Negative for trouble swallowing and voice change.   Eyes: Negative for blurred vision and visual disturbance.  Respiratory: Negative for cough, shortness of breath and wheezing.   Cardiovascular: Negative for chest pain, palpitations and leg swelling.  Gastrointestinal: Negative for nausea, vomiting and diarrhea.  Endocrine: Positive for polydipsia and polyuria. Negative for cold intolerance, heat intolerance and polyphagia.  Musculoskeletal: Negative for myalgias and arthralgias.  Skin: Negative for color change, pallor, rash and wound.  Neurological: Negative for seizures and headaches.  Psychiatric/Behavioral: Negative for suicidal ideas and confusion.    Objective:    BP 140/83 mmHg  Pulse 91  Ht '5\' 2"'$  (1.575 m)  Wt 234 lb (106.142 kg)  BMI 42.79 kg/m2  SpO2 96%  Wt Readings  from Last 3 Encounters:  07/02/15 234 lb (106.142 kg)  05/06/15 231 lb (104.781 kg)  03/07/15 220 lb (99.791 kg)    Physical Exam  Constitutional: She is oriented to person, place, and time. She appears well-developed.  HENT:  Head: Normocephalic and atraumatic.  Eyes: EOM are normal.  Neck: Normal range of motion. Neck supple. No tracheal deviation present. No thyromegaly present.  Cardiovascular: Normal rate and regular rhythm.   Pulmonary/Chest: Effort normal and breath sounds normal.  Abdominal: Soft. Bowel sounds are normal. There is no tenderness. There is no guarding.  Musculoskeletal: Normal range of motion. She exhibits no edema.  Neurological: She is alert and oriented to person, place, and time. She has normal reflexes. No cranial nerve deficit. Coordination normal.  Skin: Skin is warm and dry. No rash noted. No erythema. No pallor.  Psychiatric: She has a normal mood and affect. Judgment normal.    Results for orders placed or performed in visit on 42/35/36  Basic metabolic panel  Result Value Ref Range   Sodium 139 135 - 146 mmol/L   Potassium 4.4 3.5 - 5.3 mmol/L   Chloride 103 98 - 110 mmol/L   CO2 24 20 -  31 mmol/L   Glucose, Bld 224 (H) 65 - 99 mg/dL   BUN 39 (H) 7 - 25 mg/dL   Creat 1.02 0.50 - 1.05 mg/dL   Calcium 8.8 8.6 - 10.4 mg/dL  TSH  Result Value Ref Range   TSH 1.60 mIU/L  Hemoglobin A1c  Result Value Ref Range   Hgb A1c MFr Bld 9.6 (H) <5.7 %   Mean Plasma Glucose 229 mg/dL   Complete Blood Count (Most recent): Lab Results  Component Value Date   WBC 8.4 02/02/2013   HGB 11.0* 02/02/2013   HCT 32.7* 02/02/2013   MCV 89.1 02/02/2013   PLT 254 02/02/2013   Chemistry (most recent): Lab Results  Component Value Date   NA 139 06/24/2015   K 4.4 06/24/2015   CL 103 06/24/2015   CO2 24 06/24/2015   BUN 39* 06/24/2015   CREATININE 1.02 06/24/2015   Diabetic Labs (most recent): Lab Results  Component Value Date   HGBA1C 9.6* 06/24/2015    HGBA1C 10.6* 02/12/2015   HGBA1C 14.1* 02/02/2013     Assessment & Plan:   1. Type 2 diabetes mellitus with stage 2 chronic kidney disease, with long-term current use of insulin (HCC)   -Her diabetes is  complicated by CKD and patient remains at a high risk for more acute and chronic complications of diabetes which include CAD, CVA, CKD, retinopathy, and neuropathy. These are all discussed in detail with the patient.  Patient came with  likely better however persistently elevated glucose profile despite large dose of insulin. Once he has improved to 9.6 overall from 14.1%. -  Glucose logs and insulin administration records pertaining to this visit,  to be scanned into patient's records.   There is a concern that she may not be injecting her insulin due to fear of hypoglycemia. Recent labs reviewed.   - I have re-counseled the patient on diet management and weight loss  by adopting a carbohydrate restricted / protein rich  Diet.  - Suggestion is made for patient to avoid simple carbohydrates   from their diet including Cakes , Desserts, Ice Cream,  Soda (  diet and regular) , Sweet Tea , Candies,  Chips, Cookies, Artificial Sweeteners,   and "Sugar-free" Products .  This will help patient to have stable blood glucose profile and potentially avoid unintended  Weight gain.  - Patient is advised to stick to a routine mealtimes to eat 3 meals  a day and avoid unnecessary snacks ( to snack only to correct hypoglycemia).  - The patient  Has been   scheduled with Jearld Fenton, RDN, CDE for individualized DM education.  - I have approached patient with the following individualized plan to manage diabetes and patient agrees. -there   Is a a big concern that she may not be taking her insulin for fear of hypoglycemia, no needle marks on her skin.  - Continue  Levemir   60 units twice a day , continue Humalog  30 units TIDAC plus correction dose. -The patient is a currently having a visiting  nurse aide who is helping with managing her diet and reminding her on insulin administration. She will continue to benefit from this help.  -Continue Metformin '500mg'$  po BID.   - Patient specific target  for A1c; LDL, HDL, Triglycerides, and  Waist Circumference were discussed in detail.  2) BP/HTN: controlled. Continue current medications including ACEI/ARB. 3) Lipids/HPL:  continue statins. 4)  Weight/Diet: CDE consult in progress, exercise, and carbohydrates information  provided.  5) Chronic Care/Health Maintenance:  -Patient is  on ACEI and encouraged to continue to follow up with Ophthalmology, Podiatrist at least yearly or according to recommendations, and advised to stay away from smoking. I have recommended yearly flu vaccine and pneumonia vaccination at least every 5 years; moderate intensity exercise for up to 150 minutes weekly; and  sleep for at least 7 hours a day. -she has serious non compliance to dietary recomendations.    - 25 minutes of time was spent on the care of this patient , 50% of which was applied for counseling on diabetes complications and their preventions.  - I advised patient to maintain close follow up with Lanette Hampshire, MD for primary care needs.  Patient is asked to bring meter and  blood glucose logs during their next visit.   Follow up plan: -Return in about 3 months (around 10/01/2015) for diabetes, high blood pressure, high cholesterol, follow up with pre-visit labs, meter, and logs.  Glade Lloyd, MD Phone: [phone]  Fax: [phone]   07/02/2015, 5:32 PM

## 2015-07-02 NOTE — Patient Instructions (Signed)

## 2015-07-11 ENCOUNTER — Other Ambulatory Visit (HOSPITAL_COMMUNITY): Payer: Self-pay | Admitting: Internal Medicine

## 2015-07-11 DIAGNOSIS — Z1231 Encounter for screening mammogram for malignant neoplasm of breast: Secondary | ICD-10-CM

## 2015-07-26 ENCOUNTER — Encounter: Payer: Medicare Other | Attending: "Endocrinology | Admitting: Nutrition

## 2015-07-26 ENCOUNTER — Encounter: Payer: Self-pay | Admitting: Nutrition

## 2015-07-26 DIAGNOSIS — E1122 Type 2 diabetes mellitus with diabetic chronic kidney disease: Secondary | ICD-10-CM | POA: Insufficient documentation

## 2015-07-26 DIAGNOSIS — E1165 Type 2 diabetes mellitus with hyperglycemia: Secondary | ICD-10-CM

## 2015-07-26 DIAGNOSIS — Z794 Long term (current) use of insulin: Secondary | ICD-10-CM | POA: Diagnosis present

## 2015-07-26 DIAGNOSIS — IMO0002 Reserved for concepts with insufficient information to code with codable children: Secondary | ICD-10-CM

## 2015-07-26 DIAGNOSIS — E118 Type 2 diabetes mellitus with unspecified complications: Secondary | ICD-10-CM

## 2015-07-26 DIAGNOSIS — N182 Chronic kidney disease, stage 2 (mild): Secondary | ICD-10-CM | POA: Diagnosis present

## 2015-07-26 NOTE — Patient Instructions (Signed)
Plan: Follow Plate Method Goal. Add more vegetables  Eat 1 egg and 2 pieces of toast for breakfast OR 1 bowl of cherrios, 1 cup milk and 1 egg. Choose healthier foods when eating out Test blood sugar before meals and inject Humalog insulin within 5-10 minutes of eating meal.  DO NOT GIVE HUMALOG AND THEN GO OUT TO EAT--your blood sugar will drop quickly. Take insulin and meter with you if eating out and test and inject insulin before meal is served. Increase walking to 30 minutes every day Cut out fried foods and fast foods Lose 1 lb per week til next visit. Get A1C down to 8% in three months Get a scale and weigh self weekly.

## 2015-07-26 NOTE — Progress Notes (Signed)
Medical Nutrition Therapy:  Appt start time: 1100 end time:  1130.  Assessment:  Primary concerns today: Diabetes. Follow up   A1C down to 9.6%, down from >10%.  She is here with her Education officer, museum. She has gained  20 lbs since last visit with me 02/2015.  She is taking 60 units of Lantus BID and 30 units of Humalog with meals plus sliding scale. She is non compliant with her medication management and diet causing weight gain. She notes she is walking some. Has a fear of hypoglycemia that makes her want to eat. Has been taking her Humalog after meals at times instead of before. Eating a higher fat, higher salt diet from a lot of fast foods. Has a CNA in the home now that will be assisting with meal preparations and hopefully medication compliance. Diet is excessive in calories, fat, carb and insuffient in fresh fruits and vegetables. Has limited finances but encouraged her to buy more foods for CNA to fix rather than eat out often to make money stretch.  Lab Results  Component Value Date   HGBA1C 9.6* 06/24/2015   .last Wt Readings from Last 3 Encounters:  07/26/15 240 lb (108.863 kg)  07/02/15 234 lb (106.142 kg)  05/06/15 231 lb (104.781 kg)   Ht Readings from Last 3 Encounters:  07/26/15 '5\' 3"'$  (1.6 m)  07/02/15 '5\' 2"'$  (1.575 m)  05/06/15 '5\' 2"'$  (1.575 m)   Body mass index is 42.52 kg/(m^2).    Preferred Learning Style  Auditory  Visual  Hands on   Learning Readiness:  Ready  Change in progress   MEDICATIONS: See list  DIETARY INTAKE:  24-hr recall:  B ( AM):  Sugar pops,  2% milk,  water Snk ( AM): none  L ( PM): [employer], big mac with  [place] fries, D ( PM):  CHicken baked,  Corn, Water. Snk ( PM):Beverages: water, unsweet tea,  Usual physical activity: none.  Estimated energy needs: 1500 calories 170 g carbohydrates 112 g protein 42 g fat  Progress Towards Goal(s):  In progress.   Nutritional Diagnosis:  NB-1.1 Food and nutrition-related knowledge  deficit As related to Diabetes.  As evidenced by A1C 10.7%.    Intervention:  Nutrition counseling meal planning and My Plate for better balanced meals. Reviewed target ranges for blood sugars and prevention of complications.  Plan: Follow Plate Method Goal. Add more vegetables  Eat 1 egg and 2 pieces of toast for breakfast OR 1 bowl of cherrios, 1 cup milk and 1 egg. Choose healthier foods when eating out Test blood sugar before meals and inject Humalog insulin within 5-10 minutes of eating meal.  DO NOT GIVE HUMALOG AND THEN GO OUT TO EAT--your blood sugar will drop quickly. Take insulin and meter with you if eating out and test and inject insulin before meal is served. Increase walking to 30 minutes every day Cut out fried foods and fast foods Lose 1 lb per week til next visit. Get A1C down to 8% in three months Get a scale and weigh self weekly.  Teaching Method Utilized: Visual Auditory Hands on  Handouts given during visit include: The Plate Method Meal Plan Card  Barriers to learning/adherence to lifestyle change: mental handicap  Demonstrated degree of understanding via:  Teach Back   Monitoring/Evaluation:  Dietary intake, exercise, meal planning, and body weight in 1- 2 month(s) for more detailed diabetes education and improved DM. Social worker will give my number to her aid and then will  schedule follow up appt so CNA can attend session and learn what she would eat, time of meals and amounts for better blood sugars and weight loss.

## 2015-08-02 ENCOUNTER — Other Ambulatory Visit: Payer: Self-pay | Admitting: "Endocrinology

## 2015-08-07 ENCOUNTER — Other Ambulatory Visit: Payer: Self-pay | Admitting: "Endocrinology

## 2015-08-12 ENCOUNTER — Ambulatory Visit (HOSPITAL_COMMUNITY)
Admission: RE | Admit: 2015-08-12 | Discharge: 2015-08-12 | Disposition: A | Discharge status: Home / Self Care | Payer: Medicare Other | Source: Ambulatory Visit | Attending: Internal Medicine | Admitting: Internal Medicine

## 2015-08-12 DIAGNOSIS — Z1231 Encounter for screening mammogram for malignant neoplasm of breast: Secondary | ICD-10-CM | POA: Insufficient documentation

## 2015-08-15 ENCOUNTER — Telehealth: Payer: Self-pay

## 2015-08-15 NOTE — Telephone Encounter (Signed)
Pts caregiver brought in BG readings for Dr Dorris Fetch to review. Dr Dorris Fetch changed pts levemir dosage to 70 units bid. Pt notified and voices understanding.

## 2015-08-21 ENCOUNTER — Other Ambulatory Visit: Payer: Self-pay | Admitting: "Endocrinology

## 2015-09-09 ENCOUNTER — Encounter: Payer: Medicare Other | Attending: "Endocrinology | Admitting: Nutrition

## 2015-09-09 VITALS — Ht 63.0 in | Wt 238.0 lb

## 2015-09-09 DIAGNOSIS — N182 Chronic kidney disease, stage 2 (mild): Secondary | ICD-10-CM | POA: Diagnosis present

## 2015-09-09 DIAGNOSIS — Z794 Long term (current) use of insulin: Secondary | ICD-10-CM | POA: Diagnosis present

## 2015-09-09 DIAGNOSIS — E1122 Type 2 diabetes mellitus with diabetic chronic kidney disease: Secondary | ICD-10-CM | POA: Insufficient documentation

## 2015-09-09 DIAGNOSIS — E1165 Type 2 diabetes mellitus with hyperglycemia: Secondary | ICD-10-CM

## 2015-09-09 DIAGNOSIS — E118 Type 2 diabetes mellitus with unspecified complications: Secondary | ICD-10-CM

## 2015-09-09 DIAGNOSIS — IMO0002 Reserved for concepts with insufficient information to code with codable children: Secondary | ICD-10-CM

## 2015-09-09 NOTE — Progress Notes (Signed)
  Medical Nutrition Therapy:  Appt start time: 1100 end time:  1130.  Assessment:  Primary concerns today: Diabetes. Follow up    70 units of Lantus BID. 30 units of Humaog with meals. Questionable compliance with medications. She is not compliant with her diet. Her CNA is here today and reports pt. Eats a lot of foods she isn't suppose to; junk food, chips, crackers. She eats out 2-3 times per week fast food. She is not following the recommendations stated at each visit. Her diet is low in fresh fruits, vegetables and whole grains. BS log nor meter brought with her. Not walking per her CNA. Usually sits and does word searches. Offers lots of excuses of why she can't do what is being recommended. Drinks excessive amounts of milk because she says thirsty.  Lab Results  Component Value Date   HGBA1C 9.6* 06/24/2015   .last Wt Readings from Last 3 Encounters:  09/09/15 238 lb (107.956 kg)  07/26/15 240 lb (108.863 kg)  07/02/15 234 lb (106.142 kg)   Ht Readings from Last 3 Encounters:  09/09/15 '5\' 3"'$  (1.6 m)  07/26/15 '5\' 3"'$  (1.6 m)  07/02/15 '5\' 2"'$  (1.575 m)   Body mass index is 42.17 kg/(m^2).    Preferred Learning Style  Auditory  Visual  Hands on   Learning Readiness:  Ready  Change in progress   MEDICATIONS: See list  DIETARY INTAKE:  24-hr recall:  B ( AM):  [place] bar, 2% milk.  Snk ( AM): sometimes cheese its, chips or cookies L ( PM): [place] [employer] fish and fries, hush puppies  Diet coke   D ( PM):  Ham sandiwich with cheese,  Peach,water none Snk ( PM): MIsc chips or cookies  Beverages: water, diet soda , unsweet tea,  Usual physical activity: none.  Estimated energy needs: 1500 calories 170 g carbohydrates 112 g protein 42 g fat  Progress Towards Goal(s):  In progress.   Nutritional Diagnosis:  NB-1.1 Food and nutrition-related knowledge deficit As related to Diabetes.  As evidenced by A1C 10.7%.    Intervention:  Nutrition counseling meal  planning and My Plate for better balanced meals. Reviewed target ranges for blood sugars and prevention of complications.  Plan: Follow Plate Method Goal. Add more vegetables  1,  Cut out sweets and snacks between meals 2. Eat baked and broiled foods 3. Cut down on eating bacon 4. ONly 1 glass of milk with meals. 5. Drink more water. 6.Eat more low carb vegetables. 7. Walk 15 minutes a day.    Teaching Method Utilized: Visual Auditory Hands on  Handouts given during visit include: The Plate Method Meal Plan Card  Barriers to learning/adherence to lifestyle change: mental handicap  Demonstrated degree of understanding via:  Teach Back   Monitoring/Evaluation:  Dietary intake, exercise, meal planning, and body weight in 1- 2 month(s) for more detailed diabetes education and improved DM.

## 2015-09-09 NOTE — Patient Instructions (Signed)
Plan: Follow Plate Method Goal. Add more vegetables  1,  Cut out sweets and snacks between meals 2. Eat baked and broiled foods 3. Cut down on eating bacon to 1 per week and 2 slices. 4. ONly 1 glass of milk with meals. 5. Drink more water. 6.Eat more low carb vegetables. 7. Walk 15 minutes a day. 8  Lose 5 lbs per week.

## 2015-09-10 ENCOUNTER — Other Ambulatory Visit (HOSPITAL_COMMUNITY): Payer: Self-pay | Admitting: Internal Medicine

## 2015-09-10 DIAGNOSIS — Z780 Asymptomatic menopausal state: Secondary | ICD-10-CM

## 2015-09-13 ENCOUNTER — Ambulatory Visit (HOSPITAL_COMMUNITY): Payer: Medicare Other

## 2015-09-19 ENCOUNTER — Other Ambulatory Visit: Payer: Self-pay | Admitting: "Endocrinology

## 2015-09-21 ENCOUNTER — Other Ambulatory Visit: Payer: Self-pay | Admitting: "Endocrinology

## 2015-09-24 ENCOUNTER — Other Ambulatory Visit: Payer: Self-pay | Admitting: "Endocrinology

## 2015-09-24 LAB — BASIC METABOLIC PANEL
BUN: 32 mg/dL — ABNORMAL HIGH (ref 7–25)
CO2: 22 mmol/L (ref 20–31)
Calcium: 9.2 mg/dL (ref 8.6–10.4)
Chloride: 101 mmol/L (ref 98–110)
Creat: 1.16 mg/dL — ABNORMAL HIGH (ref 0.50–1.05)
Glucose, Bld: 218 mg/dL — ABNORMAL HIGH (ref 65–99)
Potassium: 4.1 mmol/L (ref 3.5–5.3)
Sodium: 139 mmol/L (ref 135–146)

## 2015-09-24 LAB — HEMOGLOBIN A1C
Hgb A1c MFr Bld: 9.7 % — ABNORMAL HIGH (ref <5.7)
Mean Plasma Glucose: 232 mg/dL

## 2015-09-30 ENCOUNTER — Other Ambulatory Visit: Payer: Medicare Other | Admitting: Obstetrics and Gynecology

## 2015-10-03 ENCOUNTER — Ambulatory Visit (INDEPENDENT_AMBULATORY_CARE_PROVIDER_SITE_OTHER): Payer: Medicare Other | Admitting: "Endocrinology

## 2015-10-03 ENCOUNTER — Encounter: Payer: Self-pay | Admitting: "Endocrinology

## 2015-10-03 VITALS — BP 136/83 | HR 64 | Ht 63.0 in | Wt 236.0 lb

## 2015-10-03 DIAGNOSIS — E1122 Type 2 diabetes mellitus with diabetic chronic kidney disease: Secondary | ICD-10-CM | POA: Diagnosis not present

## 2015-10-03 DIAGNOSIS — N182 Chronic kidney disease, stage 2 (mild): Secondary | ICD-10-CM

## 2015-10-03 DIAGNOSIS — Z794 Long term (current) use of insulin: Secondary | ICD-10-CM | POA: Diagnosis not present

## 2015-10-03 DIAGNOSIS — I10 Essential (primary) hypertension: Secondary | ICD-10-CM

## 2015-10-03 NOTE — Patient Instructions (Signed)

## 2015-10-03 NOTE — Progress Notes (Signed)
Subjective:    Patient ID: [name], female    DOB: October 13, 1955, PCP Lanette Hampshire, MD   Past Medical History  Diagnosis Date  . Hypertension   . Iron deficiency anemia 10/16/2010  . Diabetes mellitus without complication ([place])   . CHF (congestive heart failure) ([place])   . Mental handicap 10/16/2010   Past Surgical History  Procedure Laterality Date  . Cholecystectomy    . Abdominal hysterectomy    . Knee surgery      right knee @ 60 years of age  . Cesarean section    . Colonoscopy  08/2010    normal TI, sigmoid polyp (adenoma ). Next TCS due  08/2015,  . Esophagogastroduodenoscopy  08/2010    antral and duodenal erosions s/p bx (chronic gastritis, no h.pylori, no celiac dz ), hiatal hernia  . Left and right heart catheterization with coronary angiogram N/A 09/21/2011    Procedure: LEFT AND RIGHT HEART CATHETERIZATION WITH CORONARY ANGIOGRAM;  Surgeon: Birdie Riddle, MD;  Location: [facility];  Service: Cardiovascular;  Laterality: N/A;   Social History   Social History  . Marital Status: Divorced    Spouse Name: N/A  . Number of Children: 1  . Years of Education: N/A   Occupational History  .     Social History Main Topics  . Smoking status: Former Smoker -- 1.00 packs/day for 0 years    Types: Cigarettes  . Smokeless tobacco: Never Used     Comment: quit a couple year ago  . Alcohol Use: No  . Drug Use: No  . Sexual Activity: No   Other Topics Concern  . None   Social History Narrative   Lives with parents.    Outpatient Encounter Prescriptions as of 10/03/2015  Medication Sig  . aspirin 81 MG tablet Take 81 mg by mouth once.  . Choline Fenofibrate (FENOFIBRIC ACID) 135 MG CPDR Take 1 capsule by mouth daily.  . digoxin (LANOXIN) 0.125 MG tablet Take 0.125 mg by mouth daily.  . furosemide (LASIX) 40 MG tablet Take 40 mg by mouth daily.  . Insulin Detemir (LEVEMIR FLEXTOUCH [place]) Inject 70 Units into the skin 2 (two) times daily. At 8AM and 8PM daily   . Insulin Lispro (HUMALOG KWIKPEN [place]) Inject 32-38 Units into the skin 3 (three) times daily with meals.  . isosorbide-hydrALAZINE (BIDIL) 20-37.5 MG per tablet Take 1 tablet by mouth 3 (three) times daily.  Marland Kitchen lisinopril (PRINIVIL,ZESTRIL) 20 MG tablet Take 20 mg by mouth daily.  Marland Kitchen loratadine (CLARITIN) 10 MG tablet Take 10 mg by mouth daily.  . metFORMIN (GLUCOPHAGE) 500 MG tablet Take 1 tablet (500 mg total) by mouth 2 (two) times daily with a meal.  . [DISCONTINUED] HUMALOG KWIKPEN 200 UNIT/ML SOPN INJECT 30 TO 36 UNITS INTO THE SKIN THREE TIMES DAILY BEFORE MEALS.  . [DISCONTINUED] LEVEMIR FLEXTOUCH 100 UNIT/ML Pen INJECT 60 UNITS SUBCUTANEOUSLY TWICE DAILY AT 12 NOON AND 4 PM.  . ACCU-CHEK SOFTCLIX LANCETS lancets USE TO TEST FOUR TIMES DAILY.  Marland Kitchen B-D ULTRAFINE III SHORT PEN 31G X 8 MM MISC USE AS DIRECTED WITH LEVEMIR AND NOVOLOG. UP TO FOUR TIMES DAILY.  Marland Kitchen glucose blood (ACCU-CHEK AVIVA PLUS) test strip 4 x daily as directed. E11.65   No facility-administered encounter medications on file as of 10/03/2015.   ALLERGIES: No Known Allergies VACCINATION STATUS: Immunization History  Administered Date(s) Administered  . Influenza,inj,Quad PF,36+ Mos 02/02/2013    Diabetes She presents for her  follow-up diabetic visit. She has type 2 diabetes mellitus. Onset time: she was diagnosed at approximate age of 60 years. Her disease course has been worsening. There are no hypoglycemic associated symptoms. Pertinent negatives for hypoglycemia include no confusion, headaches, pallor or seizures. Associated symptoms include polydipsia and polyuria. Pertinent negatives for diabetes include no blurred vision, no chest pain, no fatigue and no polyphagia. There are no hypoglycemic complications. Symptoms are worsening. Diabetic complications include nephropathy. Risk factors for coronary artery disease include diabetes mellitus, dyslipidemia, hypertension, obesity and sedentary lifestyle. Current diabetic  treatment includes intensive insulin program. She is following a generally unhealthy diet. She has had a previous visit with a dietitian. She never participates in exercise. Home blood sugar record trend: Lately, she is skipping some mealtime insulin opportunities. She has a general fear of insulin/hypoglycemia. Her breakfast blood glucose range is generally >200 mg/dl. Her lunch blood glucose range is generally >200 mg/dl. Her dinner blood glucose range is generally >200 mg/dl. Her highest blood glucose is >200 mg/dl. Her overall blood glucose range is >200 mg/dl. An ACE inhibitor/angiotensin II receptor blocker is being taken.  Hypertension This is a chronic problem. The current episode started more than 1 year ago. The problem is controlled. Pertinent negatives include no blurred vision, chest pain, headaches, palpitations or shortness of breath. Risk factors for coronary artery disease include diabetes mellitus, dyslipidemia, obesity and sedentary lifestyle. Past treatments include ACE inhibitors.     Review of Systems  Constitutional: Positive for unexpected weight change. Negative for fever, chills and fatigue.  HENT: Negative for trouble swallowing and voice change.   Eyes: Negative for blurred vision and visual disturbance.  Respiratory: Negative for cough, shortness of breath and wheezing.   Cardiovascular: Negative for chest pain, palpitations and leg swelling.  Gastrointestinal: Negative for nausea, vomiting and diarrhea.  Endocrine: Positive for polydipsia and polyuria. Negative for cold intolerance, heat intolerance and polyphagia.  Musculoskeletal: Negative for myalgias and arthralgias.  Skin: Negative for color change, pallor, rash and wound.  Neurological: Negative for seizures and headaches.  Psychiatric/Behavioral: Negative for suicidal ideas and confusion.    Objective:    BP 136/83 mmHg  Pulse 64  Ht '5\' 3"'$  (1.6 m)  Wt 236 lb (107.049 kg)  BMI 41.82 kg/m2  Wt Readings  from Last 3 Encounters:  10/03/15 236 lb (107.049 kg)  09/09/15 238 lb (107.956 kg)  07/26/15 240 lb (108.863 kg)    Physical Exam  Constitutional: She is oriented to person, place, and time. She appears well-developed.  HENT:  Head: Normocephalic and atraumatic.  Eyes: EOM are normal.  Neck: Normal range of motion. Neck supple. No tracheal deviation present. No thyromegaly present.  Cardiovascular: Normal rate and regular rhythm.   Pulmonary/Chest: Effort normal and breath sounds normal.  Abdominal: Soft. Bowel sounds are normal. There is no tenderness. There is no guarding.  Musculoskeletal: Normal range of motion. She exhibits no edema.  Neurological: She is alert and oriented to person, place, and time. She has normal reflexes. No cranial nerve deficit. Coordination normal.  Skin: Skin is warm and dry. No rash noted. No erythema. No pallor.  Psychiatric: She has a normal mood and affect. Judgment normal.    Results for orders placed or performed in visit on 35/32/99  Basic metabolic panel  Result Value Ref Range   Sodium 139 135 - 146 mmol/L   Potassium 4.1 3.5 - 5.3 mmol/L   Chloride 101 98 - 110 mmol/L   CO2 22 20 -  31 mmol/L   Glucose, Bld 218 (H) 65 - 99 mg/dL   BUN 32 (H) 7 - 25 mg/dL   Creat 1.16 (H) 0.50 - 1.05 mg/dL   Calcium 9.2 8.6 - 10.4 mg/dL  Hemoglobin A1c  Result Value Ref Range   Hgb A1c MFr Bld 9.7 (H) <5.7 %   Mean Plasma Glucose 232 mg/dL   Complete Blood Count (Most recent): Lab Results  Component Value Date   WBC 8.4 02/02/2013   HGB 11.0* 02/02/2013   HCT 32.7* 02/02/2013   MCV 89.1 02/02/2013   PLT 254 02/02/2013   Chemistry (most recent): Lab Results  Component Value Date   NA 139 09/24/2015   K 4.1 09/24/2015   CL 101 09/24/2015   CO2 22 09/24/2015   BUN 32* 09/24/2015   CREATININE 1.16* 09/24/2015   Diabetic Labs (most recent): Lab Results  Component Value Date   HGBA1C 9.7* 09/24/2015   HGBA1C 9.6* 06/24/2015   HGBA1C 10.6*  02/12/2015     Assessment & Plan:   1. Type 2 diabetes mellitus with stage 2 chronic kidney disease, with long-term current use of insulin (HCC)   -Her diabetes is  complicated by CKD and patient remains at a high risk for more acute and chronic complications of diabetes which include CAD, CVA, CKD, retinopathy, and neuropathy. These are all discussed in detail with the patient.  Patient came with  likely better however persistently elevated glucose profile despite large dose of insulin. - Her A1c has not changed from last visit at  9.7%, although  Overall improved  from 14.1%. -  Glucose logs and insulin administration records pertaining to this visit,  to be scanned into patient's records.   There is a concern that she may not be injecting her insulin due to fear of hypoglycemia. Recent labs reviewed.   - I have re-counseled the patient on diet management and weight loss  by adopting a carbohydrate restricted / protein rich  Diet.  - Suggestion is made for patient to avoid simple carbohydrates   from their diet including Cakes , Desserts, Ice Cream,  Soda (  diet and regular) , Sweet Tea , Candies,  Chips, Cookies, Artificial Sweeteners,   and "Sugar-free" Products .  This will help patient to have stable blood glucose profile and potentially avoid unintended  Weight gain.  - Patient is advised to stick to a routine mealtimes to eat 3 meals  a day and avoid unnecessary snacks ( to snack only to correct hypoglycemia).  - The patient  Has been   scheduled with Jearld Fenton, RDN, CDE for individualized DM education.  - I have approached patient with the following individualized plan to manage diabetes and patient agrees. -there   Is a a big concern that she may not be taking her insulin for fear of hypoglycemia, no needle marks on her skin.  - Skin examination on her abdomen does not suggest that she is injecting more than prescribed insulin despite her insistence. - She still has  serious dietary indiscretion consumes a lot of processed carbs.  - Continue  Levemir   70 units twice a day , continue Humalog  32 units TIDAC plus correction dose. -The patient is  accompanied by her case manager with offering to help her get placement. This will be an ideal situation for her given the fact that she has struggled to control glycemia for various reasons including fear of insulin/hypoglycemia and consumption of bad carbs.  - If  her glycemic profile does not improve and/or her A1c does not reverse, she will need either 24 /7 supervision at home or placement in group homes. -Continue Metformin '500mg'$  po BID.  - Patient specific target  for A1c; LDL, HDL, Triglycerides, and  Waist Circumference were discussed in detail.  2) BP/HTN: controlled. Continue current medications including ACEI/ARB. 3) Lipids/HPL:  continue statins. 4)  Weight/Diet: CDE consult in progress, exercise, and carbohydrates information provided.  5) Chronic Care/Health Maintenance:  -Patient is  on ACEI and encouraged to continue to follow up with Ophthalmology, Podiatrist at least yearly or according to recommendations, and advised to stay away from smoking. I have recommended yearly flu vaccine and pneumonia vaccination at least every 5 years; moderate intensity exercise for up to 150 minutes weekly; and  sleep for at least 7 hours a day. -she has serious non compliance to dietary recomendations.    - 25 minutes of time was spent on the care of this patient , 50% of which was applied for counseling on diabetes complications and their preventions.  - I advised patient to maintain close follow up with Lanette Hampshire, MD for primary care needs.  Patient is asked to bring meter and  blood glucose logs during their next visit.   Follow up plan: -Return in about 3 months (around 01/03/2016) for meter, and logs.  Glade Lloyd, MD Phone: [phone]  Fax: [phone]   10/03/2015, 11:38 AM

## 2015-10-16 ENCOUNTER — Encounter: Payer: Medicare Other | Attending: "Endocrinology | Admitting: Nutrition

## 2015-10-16 DIAGNOSIS — Z794 Long term (current) use of insulin: Secondary | ICD-10-CM | POA: Insufficient documentation

## 2015-10-16 DIAGNOSIS — E1122 Type 2 diabetes mellitus with diabetic chronic kidney disease: Secondary | ICD-10-CM | POA: Insufficient documentation

## 2015-10-16 DIAGNOSIS — E118 Type 2 diabetes mellitus with unspecified complications: Secondary | ICD-10-CM

## 2015-10-16 DIAGNOSIS — N182 Chronic kidney disease, stage 2 (mild): Secondary | ICD-10-CM | POA: Diagnosis present

## 2015-10-16 DIAGNOSIS — IMO0002 Reserved for concepts with insufficient information to code with codable children: Secondary | ICD-10-CM

## 2015-10-16 DIAGNOSIS — E1165 Type 2 diabetes mellitus with hyperglycemia: Secondary | ICD-10-CM

## 2015-10-16 NOTE — Patient Instructions (Signed)
Plan: Follow Plate Method Goal. Add more vegetables  1, Follow up Plate Method 2. Eat baked and broiled food 4. ONly 1 glass of milk with meals. 5. Drink more water. 6.Eat more low carb vegetables. 7. Walk 15 minutes a day.

## 2015-10-16 NOTE — Progress Notes (Signed)
  Medical Nutrition Therapy:  Appt start time: 1100 end time:  1130.  Assessment:  Primary concerns today: Diabetes. Follow up . A1C 9.7%, up from 9.6%. Taking 70 units of Levemir  BID. 30 units of Humaog with meals.  Changes made: Eating better, cut out chips and eating meals on time.  Aide is here and comes to see her on MWF.  Taking insulin with meals. Drinking 1% milk now. Lost 4 lbs.  Downloaded meter. Reveals BS are consistently high at all meals. Needs better compliance with diet and needed weight loss. Her CNA reports compliance with lunch meals and meds at this time.    Lab Results  Component Value Date   HGBA1C 9.7* 09/24/2015   .last Wt Readings from Last 3 Encounters:  10/16/15 234 lb 3.2 oz (106.232 kg)  10/03/15 236 lb (107.049 kg)  09/09/15 238 lb (107.956 kg)   Ht Readings from Last 3 Encounters:  10/16/15 '5\' 3"'$  (1.6 m)  10/03/15 '5\' 3"'$  (1.6 m)  09/09/15 '5\' 3"'$  (1.6 m)   Body mass index is 41.5 kg/(m^2).    Preferred Learning Style  Auditory  Visual  Hands on   Learning Readiness:  Ready  Change in progress   MEDICATIONS: See list  DIETARY INTAKE:  24-hr recall:  B ( AM):  [place] bar, 2% milk.  Snk ( AM):  L ( PM): baked pork chop, lima beans and water D ( PM): Meat or sandwich,. none Snk ( PM): MIsc chips or cookies  Beverages: water, diet soda , unsweet tea,  Usual physical activity: none.  Estimated energy needs: 1500 calories 170 g carbohydrates 112 g protein 42 g fat  Progress Towards Goal(s):  In progress.   Nutritional Diagnosis:  NB-1.1 Food and nutrition-related knowledge deficit As related to Diabetes.  As evidenced by A1C 10.7%.    Intervention:  Nutrition counseling meal planning and My Plate for better balanced meals. Reviewed target ranges for blood sugars and prevention of complications.  Plan: Follow Plate Method Goal. Add more vegetables  CUT OUT SNACKS between meals. 1, Follow up Plate Method 2. Eat baked and  broiled food 4. Only 1 glass of milk with meals. 5. Drink more water. 6.Eat more low carb vegetables. 7. Walk 15 minutes a day.    Teaching Method Utilized: Visual Auditory Hands on  Handouts given during visit include: The Plate Method Meal Plan Card  Barriers to learning/adherence to lifestyle change: mental handicap  Demonstrated degree of understanding via:  Teach Back   Monitoring/Evaluation:  Dietary intake, exercise, meal planning, and body weight in 1- 2 month(s) for more detailed diabetes education and improved DM.

## 2015-10-30 ENCOUNTER — Other Ambulatory Visit: Payer: Self-pay | Admitting: "Endocrinology

## 2015-11-09 ENCOUNTER — Other Ambulatory Visit: Payer: Self-pay | Admitting: "Endocrinology

## 2015-11-12 ENCOUNTER — Other Ambulatory Visit: Payer: Self-pay | Admitting: "Endocrinology

## 2015-11-28 ENCOUNTER — Other Ambulatory Visit: Payer: Self-pay | Admitting: "Endocrinology

## 2015-12-04 ENCOUNTER — Encounter: Payer: Medicare Other | Attending: "Endocrinology | Admitting: Nutrition

## 2015-12-04 VITALS — Ht 63.0 in | Wt 239.2 lb

## 2015-12-04 DIAGNOSIS — E1122 Type 2 diabetes mellitus with diabetic chronic kidney disease: Secondary | ICD-10-CM | POA: Insufficient documentation

## 2015-12-04 DIAGNOSIS — E1165 Type 2 diabetes mellitus with hyperglycemia: Secondary | ICD-10-CM

## 2015-12-04 DIAGNOSIS — IMO0002 Reserved for concepts with insufficient information to code with codable children: Secondary | ICD-10-CM

## 2015-12-04 DIAGNOSIS — Z794 Long term (current) use of insulin: Secondary | ICD-10-CM | POA: Diagnosis present

## 2015-12-04 DIAGNOSIS — N182 Chronic kidney disease, stage 2 (mild): Secondary | ICD-10-CM | POA: Diagnosis present

## 2015-12-04 DIAGNOSIS — E118 Type 2 diabetes mellitus with unspecified complications: Secondary | ICD-10-CM

## 2015-12-04 NOTE — Patient Instructions (Signed)
Goal 1.  Cut out snacks between meals 2 Drink only water 3. Cut out sodas and cheetos 4. Lose 5 lbs. 5.  Walk 30 minutes  With aide three times per week.

## 2015-12-04 NOTE — Progress Notes (Signed)
  Medical Nutrition Therapy:  Appt start time: 1030end time:  1100  Assessment:  Primary concerns today: Diabetes Type 2.  Follow up . A1C 9.7%  Here with her CNA today. CNA is helping cooking meals baked and broiled. Pt fries some food and gets fast food at times. Hasn't made any significant changes. Gained 3 lbs. BS are mostly 200's. Lost her friend who use to take her to grocery store recently. Still snacking on cheese puffs and soda and popcorn. Not exercising. Remains non compliant with diet and questionable compliance with medications.   Lab Results  Component Value Date   HGBA1C 9.7 (H) 09/24/2015   .last Wt Readings from Last 3 Encounters:  12/04/15 239 lb 3.2 oz (108.5 kg)  10/16/15 234 lb 3.2 oz (106.2 kg)  10/03/15 236 lb (107 kg)   Ht Readings from Last 3 Encounters:  12/04/15 '5\' 3"'$  (1.6 m)  10/16/15 '5\' 3"'$  (1.6 m)  10/03/15 '5\' 3"'$  (1.6 m)   Body mass index is 42.37 kg/m.    Preferred Learning Style  Auditory  Visual  Hands on   Learning Readiness:  Ready  Change in progress   MEDICATIONS: See list  DIETARY INTAKE:  24-hr recall:  B ( AM):  Banana,  Snk ( AM):  L ( PM):fast food  D ( PM) Can't remember.  none Snk ( PM): MIsc chips or cookies  Beverages: water, diet soda , unsweet tea,  Usual physical activity: none.  Estimated energy needs: 1500 calories 170 g carbohydrates 112 g protein 42 g fat  Progress Towards Goal(s):  In progress.   Nutritional Diagnosis:  NB-1.1 Food and nutrition-related knowledge deficit As related to Diabetes.  As evidenced by A1C 10.7%.    Intervention:  Nutrition counseling meal planning and My Plate for better balanced meals. Reviewed target ranges for blood sugars and prevention of complications.  Plan: Follow Plate Method Goal 1.  Cut out snacks between meals 2 Drink only water 3. Cut out sodas and cheetos 4. Lose 5 lbs. 5.  Walk 30 minutes  With aide three times per week.  Teaching Method Utilized:  Visual Auditory Hands on  Handouts given during visit include: The Plate Method Meal Plan Card  Barriers to learning/adherence to lifestyle change: mental handicap  Demonstrated degree of understanding via:  Teach Back   Monitoring/Evaluation:  Dietary intake, exercise, meal planning, and body weight in 1- 2 month(s) for more detailed diabetes education and improved DM.

## 2015-12-06 ENCOUNTER — Other Ambulatory Visit: Payer: Self-pay | Admitting: "Endocrinology

## 2015-12-09 NOTE — Telephone Encounter (Signed)
error 

## 2015-12-20 ENCOUNTER — Other Ambulatory Visit: Payer: Self-pay | Admitting: "Endocrinology

## 2016-01-01 ENCOUNTER — Other Ambulatory Visit: Payer: Self-pay | Admitting: "Endocrinology

## 2016-01-01 LAB — COMPLETE METABOLIC PANEL WITH GFR
ALT: 29 U/L (ref 6–29)
AST: 25 U/L (ref 10–35)
Albumin: 4 g/dL (ref 3.6–5.1)
Alkaline Phosphatase: 77 U/L (ref 33–130)
BUN: 38 mg/dL — ABNORMAL HIGH (ref 7–25)
CO2: 23 mmol/L (ref 20–31)
Calcium: 9.4 mg/dL (ref 8.6–10.4)
Chloride: 103 mmol/L (ref 98–110)
Creat: 1.15 mg/dL — ABNORMAL HIGH (ref 0.50–0.99)
GFR, Est African American: 60 mL/min (ref >=60)
GFR, Est Non African American: 52 mL/min — ABNORMAL LOW (ref >=60)
Glucose, Bld: 182 mg/dL — ABNORMAL HIGH (ref 65–99)
Potassium: 4.3 mmol/L (ref 3.5–5.3)
Sodium: 138 mmol/L (ref 135–146)
Total Bilirubin: 0.2 mg/dL (ref 0.2–1.2)
Total Protein: 7.2 g/dL (ref 6.1–8.1)

## 2016-01-02 LAB — HEMOGLOBIN A1C
Hgb A1c MFr Bld: 8.8 % — ABNORMAL HIGH (ref <5.7)
Mean Plasma Glucose: 206 mg/dL

## 2016-01-08 ENCOUNTER — Encounter: Payer: Self-pay | Admitting: "Endocrinology

## 2016-01-08 ENCOUNTER — Ambulatory Visit (INDEPENDENT_AMBULATORY_CARE_PROVIDER_SITE_OTHER): Payer: Medicare Other | Admitting: "Endocrinology

## 2016-01-08 ENCOUNTER — Encounter: Payer: Medicare Other | Attending: "Endocrinology | Admitting: Nutrition

## 2016-01-08 VITALS — Ht 63.0 in | Wt 239.0 lb

## 2016-01-08 VITALS — BP 139/84 | HR 100 | Ht 63.0 in | Wt 239.0 lb

## 2016-01-08 DIAGNOSIS — Z794 Long term (current) use of insulin: Secondary | ICD-10-CM

## 2016-01-08 DIAGNOSIS — N182 Chronic kidney disease, stage 2 (mild): Secondary | ICD-10-CM | POA: Diagnosis not present

## 2016-01-08 DIAGNOSIS — E118 Type 2 diabetes mellitus with unspecified complications: Secondary | ICD-10-CM

## 2016-01-08 DIAGNOSIS — E1122 Type 2 diabetes mellitus with diabetic chronic kidney disease: Secondary | ICD-10-CM | POA: Insufficient documentation

## 2016-01-08 DIAGNOSIS — I10 Essential (primary) hypertension: Secondary | ICD-10-CM

## 2016-01-08 MED ORDER — INSULIN REGULAR HUMAN (CONC) 500 UNIT/ML ~~LOC~~ SOPN: 50.0000 [IU] | PEN_INJECTOR | Freq: Three times a day (TID) | SUBCUTANEOUS | 2 refills | Status: DC

## 2016-01-08 NOTE — Patient Instructions (Signed)
Goal 1 Walk three days a week and 30 minutes. 2.  Drink more water 3.  Cut out snacks between meals 4. Eat apple with meals instead of between meal.

## 2016-01-08 NOTE — Progress Notes (Signed)
  Medical Nutrition Therapy:  Appt start time: 1030end time:  1100  Assessment:  Primary concerns today: Diabetes Type 2.   A1C 9.7% was and now down to 8.8%. Her guardian social worker, [name] is here with her today. She has an aide that is with her Monday, Wednesdays and Fridays to assist with medications, meal management and ADL.    She is testing her blood sugars three times per week.    Gained 3 lbs but that is probably from better blood sugar control.     Takes 70 units of Levemir daily and 32 units of HUmalog with meals. Saw Dr. Dorris Fetch today. Anticipating changing over to U500 insulin for better BS control.     Exercising by walking some but not consisten and not often enough for needed weight loss. Doesn't want to walk with her aide often.. Her dad has been in hospital recently and now at [place] center and her son takes her there to see him.    Cooks a lot of meals her self. Says she is getting rid of sweets and chips at home but still has chips and PB nabs etc at home.  Eats apple for snacks.  Lab Results  Component Value Date   HGBA1C 8.8 (H) 01/01/2016   .last Wt Readings from Last 3 Encounters:  01/08/16 239 lb (108.4 kg)  12/04/15 239 lb 3.2 oz (108.5 kg)  10/16/15 234 lb 3.2 oz (106.2 kg)   Ht Readings from Last 3 Encounters:  01/08/16 '5\' 3"'$  (1.6 m)  12/04/15 '5\' 3"'$  (1.6 m)  10/16/15 '5\' 3"'$  (1.6 m)   There is no height or weight on file to calculate BMI.    Preferred Learning Style  Auditory  Visual  Hands on   Learning Readiness:  Ready  Change in progress   MEDICATIONS: See list  DIETARY INTAKE:  24-hr recall:  B ( AM): sugar pop,  Snk ( AM): apple L ( PM): short sugar, double cheese burger,  D ( PM) Steak, corn, water Apple   none Snk ( PM): Usual physical activity: none.  Estimated energy needs: 1500 calories 170 g carbohydrates 112 g protein 42 g fat  Progress Towards Goal(s):  In progress.   Nutritional Diagnosis:  NB-1.1  Food and nutrition-related knowledge deficit As related to Diabetes.  As evidenced by A1C 10.7%.    Intervention:  Nutrition counseling meal planning and My Plate for better balanced meals. Reviewed target ranges for blood sugars and prevention of complications.  Plan: Follow Plate Method Goal 1.  Cut out snacks between meals 2 Drink only water 3. Cut out sodas and cheetos 4. Lose 5 lbs. 5.  Walk 30 minutes  With aide three times per week.  Teaching Method Utilized: Visual Auditory Hands on  Handouts given during visit include: The Plate Method Meal Plan Card  Barriers to learning/adherence to lifestyle change: mental handicap  Demonstrated degree of understanding via:  Teach Back   Monitoring/Evaluation:  Dietary intake, exercise, meal planning, and body weight in2 weeks for more detailed diabetes education and improved DM. Will start U 500 insulin.

## 2016-01-08 NOTE — Progress Notes (Signed)
Subjective:    Patient ID: [name], female    DOB: [birth year], PCP Lanette Hampshire, MD   Past Medical History:  Diagnosis Date  . CHF (congestive heart failure) ([place])   . Diabetes mellitus without complication ([place])   . Hypertension   . Iron deficiency anemia 10/16/2010  . Mental handicap 10/16/2010   Past Surgical History:  Procedure Laterality Date  . ABDOMINAL HYSTERECTOMY    . CESAREAN SECTION    . CHOLECYSTECTOMY    . COLONOSCOPY  08/2010   normal TI, sigmoid polyp (adenoma ). Next TCS due  08/2015,  . ESOPHAGOGASTRODUODENOSCOPY  08/2010   antral and duodenal erosions s/p bx (chronic gastritis, no h.pylori, no celiac dz ), hiatal hernia  . KNEE SURGERY     right knee @ 60 years of age  . LEFT AND RIGHT HEART CATHETERIZATION WITH CORONARY ANGIOGRAM N/A 09/21/2011   Procedure: LEFT AND RIGHT HEART CATHETERIZATION WITH CORONARY ANGIOGRAM;  Surgeon: Birdie Riddle, MD;  Location: [facility];  Service: Cardiovascular;  Laterality: N/A;   Social History   Social History  . Marital status: Divorced    Spouse name: N/A  . Number of children: 1  . Years of education: N/A   Occupational History  .  Unemployed   Social History Main Topics  . Smoking status: Former Smoker    Packs/day: 1.00    Years: 0.00    Types: Cigarettes  . Smokeless tobacco: Never Used     Comment: quit a couple year ago  . Alcohol use No  . Drug use: No  . Sexual activity: No   Other Topics Concern  . Not on file   Social History Narrative   Lives with parents.    Outpatient Encounter Prescriptions as of 01/08/2016  Medication Sig  . aspirin 81 MG tablet Take 81 mg by mouth once.  . Choline Fenofibrate (FENOFIBRIC ACID) 135 MG CPDR Take 1 capsule by mouth daily.  . digoxin (LANOXIN) 0.125 MG tablet Take 0.125 mg by mouth daily.  . furosemide (LASIX) 40 MG tablet Take 40 mg by mouth daily.  . insulin regular human CONCENTRATED (HUMULIN R U-500 KWIKPEN) 500 UNIT/ML kwikpen Inject 50  Units into the skin 3 (three) times daily with meals.  . isosorbide-hydrALAZINE (BIDIL) 20-37.5 MG per tablet Take 1 tablet by mouth 3 (three) times daily.  Marland Kitchen lisinopril (PRINIVIL,ZESTRIL) 20 MG tablet Take 20 mg by mouth daily.  Marland Kitchen loratadine (CLARITIN) 10 MG tablet Take 10 mg by mouth daily.  . metFORMIN (GLUCOPHAGE) 500 MG tablet Take 1 tablet (500 mg total) by mouth 2 (two) times daily with a meal.  . [DISCONTINUED] ACCU-CHEK SOFTCLIX LANCETS lancets USE TO TEST FOUR TIMES DAILY.  . [DISCONTINUED] glucose blood (ACCU-CHEK AVIVA PLUS) test strip 4 x daily as directed. E11.65  . [DISCONTINUED] Insulin Detemir (LEVEMIR FLEXTOUCH) 100 UNIT/ML Pen Inject 70 units sq twice daily at 12 noon and 4pm  . [DISCONTINUED] Insulin Lispro (HUMALOG KWIKPEN [place]) Inject 32-38 Units into the skin 3 (three) times daily with meals.  . [DISCONTINUED] Insulin Lispro (HUMALOG KWIKPEN) 200 UNIT/ML SOPN Inject 32-38 Units into the skin 3 (three) times daily.  . [DISCONTINUED] SURE COMFORT PEN NEEDLES 31G X 8 MM MISC USE AS DIRECTED WITH LEVEMIR AND NOVOLOG. UP TO FOUR TIMES DAILY.   No facility-administered encounter medications on file as of 01/08/2016.    ALLERGIES: No Known Allergies VACCINATION STATUS: Immunization History  Administered Date(s) Administered  . Influenza,inj,Quad PF,36+ Mos  02/02/2013    Diabetes  She presents for her follow-up diabetic visit. She has type 2 diabetes mellitus. Onset time: she was diagnosed at approximate age of 69 years. Her disease course has been improving. There are no hypoglycemic associated symptoms. Pertinent negatives for hypoglycemia include no confusion, headaches, pallor or seizures. Associated symptoms include polydipsia and polyuria. Pertinent negatives for diabetes include no blurred vision, no chest pain, no fatigue and no polyphagia. There are no hypoglycemic complications. Symptoms are improving. Diabetic complications include nephropathy. Risk factors for coronary  artery disease include diabetes mellitus, dyslipidemia, hypertension, obesity and sedentary lifestyle. Current diabetic treatment includes intensive insulin program. Her weight is stable (No success on weight loss.). She is following a generally unhealthy diet. She has had a previous visit with a dietitian. She never participates in exercise. Home blood sugar record trend: Lately, she is skipping some mealtime insulin opportunities. She has a general fear of insulin/hypoglycemia. Her breakfast blood glucose range is generally >200 mg/dl. Her lunch blood glucose range is generally >200 mg/dl. Her dinner blood glucose range is generally >200 mg/dl. Her highest blood glucose is >200 mg/dl. Her overall blood glucose range is >200 mg/dl. An ACE inhibitor/angiotensin II receptor blocker is being taken.  Hypertension  This is a chronic problem. The current episode started more than 1 year ago. The problem is controlled. Pertinent negatives include no blurred vision, chest pain, headaches, palpitations or shortness of breath. Risk factors for coronary artery disease include diabetes mellitus, dyslipidemia, obesity and sedentary lifestyle. Past treatments include ACE inhibitors.     Review of Systems  Constitutional: Positive for unexpected weight change. Negative for chills, fatigue and fever.  HENT: Negative for trouble swallowing and voice change.   Eyes: Negative for blurred vision and visual disturbance.  Respiratory: Negative for cough, shortness of breath and wheezing.   Cardiovascular: Negative for chest pain, palpitations and leg swelling.  Gastrointestinal: Negative for diarrhea, nausea and vomiting.  Endocrine: Positive for polydipsia and polyuria. Negative for cold intolerance, heat intolerance and polyphagia.  Musculoskeletal: Negative for arthralgias and myalgias.  Skin: Negative for color change, pallor, rash and wound.  Neurological: Negative for seizures and headaches.   Psychiatric/Behavioral: Negative for confusion and suicidal ideas.    Objective:    BP 139/84   Pulse 100   Ht '5\' 3"'$  (1.6 m)   Wt 239 lb (108.4 kg)   BMI 42.34 kg/m   Wt Readings from Last 3 Encounters:  01/08/16 239 lb (108.4 kg)  12/04/15 239 lb 3.2 oz (108.5 kg)  10/16/15 234 lb 3.2 oz (106.2 kg)    Physical Exam  Constitutional: She is oriented to person, place, and time. She appears well-developed.  HENT:  Head: Normocephalic and atraumatic.  Eyes: EOM are normal.  Neck: Normal range of motion. Neck supple. No tracheal deviation present. No thyromegaly present.  Cardiovascular: Normal rate and regular rhythm.   Pulmonary/Chest: Effort normal and breath sounds normal.  Abdominal: Soft. Bowel sounds are normal. There is no tenderness. There is no guarding.  Musculoskeletal: Normal range of motion. She exhibits no edema.  Neurological: She is alert and oriented to person, place, and time. She has normal reflexes. No cranial nerve deficit. Coordination normal.  Skin: Skin is warm and dry. No rash noted. No erythema. No pallor.  Psychiatric: She has a normal mood and affect. Judgment normal.    Results for orders placed or performed in visit on 01/01/16  COMPLETE METABOLIC PANEL WITH GFR  Result Value Ref Range  Sodium 138 135 - 146 mmol/L   Potassium 4.3 3.5 - 5.3 mmol/L   Chloride 103 98 - 110 mmol/L   CO2 23 20 - 31 mmol/L   Glucose, Bld 182 (H) 65 - 99 mg/dL   BUN 38 (H) 7 - 25 mg/dL   Creat 1.15 (H) 0.50 - 0.99 mg/dL   Total Bilirubin 0.2 0.2 - 1.2 mg/dL   Alkaline Phosphatase 77 33 - 130 U/L   AST 25 10 - 35 U/L   ALT 29 6 - 29 U/L   Total Protein 7.2 6.1 - 8.1 g/dL   Albumin 4.0 3.6 - 5.1 g/dL   Calcium 9.4 8.6 - 10.4 mg/dL   GFR, Est African American 60 >=60 mL/min   GFR, Est Non African American 52 (L) >=60 mL/min  Hemoglobin A1c  Result Value Ref Range   Hgb A1c MFr Bld 8.8 (H) <5.7 %   Mean Plasma Glucose 206 mg/dL   Complete Blood Count (Most  recent): Lab Results  Component Value Date   WBC 8.4 02/02/2013   HGB 11.0 (L) 02/02/2013   HCT 32.7 (L) 02/02/2013   MCV 89.1 02/02/2013   PLT 254 02/02/2013   Chemistry (most recent): Lab Results  Component Value Date   NA 138 01/01/2016   K 4.3 01/01/2016   CL 103 01/01/2016   CO2 23 01/01/2016   BUN 38 (H) 01/01/2016   CREATININE 1.15 (H) 01/01/2016   Diabetic Labs (most recent): Lab Results  Component Value Date   HGBA1C 8.8 (H) 01/01/2016   HGBA1C 9.7 (H) 09/24/2015   HGBA1C 9.6 (H) 06/24/2015     Assessment & Plan:   1. Type 2 diabetes mellitus with stage 2 chronic kidney disease, with long-term current use of insulin (HCC)   -Her diabetes is  complicated by CKD and patient remains at a high risk for more acute and chronic complications of diabetes which include CAD, CVA, CKD, retinopathy, and neuropathy. These are all discussed in detail with the patient.  Patient came with  likely better however persistently elevated glucose profile despite large dose of insulin. - Her A1c has Improved to 8.8 %,   Overall improved  from 14.1%. -  Glucose logs and insulin administration records pertaining to this visit,  to be scanned into patient's records.    Recent labs reviewed.   - I have re-counseled the patient on diet management and weight loss  by adopting a carbohydrate restricted / protein rich  Diet.  - Suggestion is made for patient to avoid simple carbohydrates   from their diet including Cakes , Desserts, Ice Cream,  Soda (  diet and regular) , Sweet Tea , Candies,  Chips, Cookies, Artificial Sweeteners,   and "Sugar-free" Products .  This will help patient to have stable blood glucose profile and potentially avoid unintended  Weight gain.  - Patient is advised to stick to a routine mealtimes to eat 3 meals  a day and avoid unnecessary snacks ( to snack only to correct hypoglycemia).  - The patient  Has been   scheduled with Jearld Fenton, RDN, CDE for  individualized DM education.  - I have approached patient with the following individualized plan to manage diabetes and patient agrees. -there   Is a a big concern that she may not be taking her insulin for fear of hypoglycemia, no needle marks on her skin.  -  There is some  Skin evidence that she is administering insulin this time.   -  She still has serious dietary indiscretion consumes a lot of processed carbs.  -  She will be considered for U500 insulin after she uses up her current supplies of Levemir and Humalog.  - I advised her to increase   Levemir   To 80 units twice a day , continue Humalog  35 units TIDAC plus correction dose. -The patient is  accompanied by her case manager with offering to help her get placement. This will be an ideal situation for her given the fact that she has struggled to control glycemia for various reasons including fear of insulin/hypoglycemia and consumption of bad carbs.  - She is asked to bring all insulin leftovers as well as her new prescription for insulin U500 with her in 12 days. -Continue Metformin '500mg'$  po BID.  - Patient specific target  for A1c; LDL, HDL, Triglycerides, and  Waist Circumference were discussed in detail.  2) BP/HTN: controlled. Continue current medications including ACEI/ARB. 3) Lipids/HPL:  continue statins. 4)  Weight/Diet: CDE consult in progress, exercise, and carbohydrates information provided.  5) Chronic Care/Health Maintenance:  -Patient is  on ACEI and encouraged to continue to follow up with Ophthalmology, Podiatrist at least yearly or according to recommendations, and advised to stay away from smoking. I have recommended yearly flu vaccine and pneumonia vaccination at least every 5 years; moderate intensity exercise for up to 150 minutes weekly; and  sleep for at least 7 hours a day. -she has serious non compliance to dietary recomendations.    - 25 minutes of time was spent on the care of this patient , 50% of which  was applied for counseling on diabetes complications and their preventions.  - I advised patient to maintain close follow up with Lanette Hampshire, MD for primary care needs.  Patient is asked to bring meter and  blood glucose logs during their next visit.   Follow up plan: -Return in about 12 days (around 01/20/2016) for follow up with meter and logs- no labs, she will bring all leftover insulin and her new U500.  Glade Lloyd, MD Phone: [phone]  Fax: [phone]   01/08/2016, 10:51 AM

## 2016-01-09 ENCOUNTER — Encounter: Payer: Self-pay | Admitting: Nutrition

## 2016-01-20 ENCOUNTER — Encounter: Payer: Medicare Other | Admitting: Nutrition

## 2016-01-20 ENCOUNTER — Encounter: Payer: Self-pay | Admitting: "Endocrinology

## 2016-01-20 ENCOUNTER — Encounter: Payer: Self-pay | Admitting: Nutrition

## 2016-01-20 ENCOUNTER — Ambulatory Visit (INDEPENDENT_AMBULATORY_CARE_PROVIDER_SITE_OTHER): Payer: Medicare Other | Admitting: "Endocrinology

## 2016-01-20 ENCOUNTER — Telehealth: Payer: Self-pay | Admitting: "Endocrinology

## 2016-01-20 VITALS — BP 134/79 | HR 87 | Ht 63.0 in | Wt 239.0 lb

## 2016-01-20 VITALS — Ht 66.0 in | Wt 239.0 lb

## 2016-01-20 DIAGNOSIS — Z794 Long term (current) use of insulin: Secondary | ICD-10-CM

## 2016-01-20 DIAGNOSIS — E1122 Type 2 diabetes mellitus with diabetic chronic kidney disease: Secondary | ICD-10-CM | POA: Diagnosis not present

## 2016-01-20 DIAGNOSIS — E669 Obesity, unspecified: Secondary | ICD-10-CM

## 2016-01-20 DIAGNOSIS — IMO0002 Reserved for concepts with insufficient information to code with codable children: Secondary | ICD-10-CM

## 2016-01-20 DIAGNOSIS — N182 Chronic kidney disease, stage 2 (mild): Secondary | ICD-10-CM

## 2016-01-20 DIAGNOSIS — I10 Essential (primary) hypertension: Secondary | ICD-10-CM

## 2016-01-20 DIAGNOSIS — E118 Type 2 diabetes mellitus with unspecified complications: Principal | ICD-10-CM

## 2016-01-20 DIAGNOSIS — E1165 Type 2 diabetes mellitus with hyperglycemia: Secondary | ICD-10-CM

## 2016-01-20 MED ORDER — INSULIN REGULAR HUMAN (CONC) 500 UNIT/ML ~~LOC~~ SOPN: 60.0000 [IU] | PEN_INJECTOR | Freq: Three times a day (TID) | SUBCUTANEOUS | 2 refills | Status: DC

## 2016-01-20 NOTE — Progress Notes (Signed)
  Medical Nutrition Therapy:  Appt start time: 1030end time:  1100  Assessment:  Primary concerns today: Diabetes Type 2. She is here with her guardian Education officer, [employer]. Saw Dr. Dorris Fetch today. Started on U 500 insulin today- will take 60 units TID before meals. To test 4 times per day.    Meal times are not consistent. Needs an alarm clock to wake up and eat breakfast on time. Says she has been walking some with her brother and aide. Denies missing doses of insulin. Social Worker agreed to dispose of all other insulin in her home.   Admits to snacking between meals. Still cooks for herself. Drinks sweet tea but she says she dilutes it.     Wt stable.  Needs better compliance with diet.   Lab Results  Component Value Date   HGBA1C 8.8 (H) 01/01/2016   .last Wt Readings from Last 3 Encounters:  01/20/16 239 lb (108.4 kg)  01/09/16 239 lb (108.4 kg)  01/08/16 239 lb (108.4 kg)   Ht Readings from Last 3 Encounters:  01/20/16 '5\' 3"'$  (1.6 m)  01/09/16 '5\' 3"'$  (1.6 m)  01/08/16 '5\' 3"'$  (1.6 m)   There is no height or weight on file to calculate BMI.    Preferred Learning Style  Auditory  Visual  Hands on   Learning Readiness:  Ready  Change in progress   MEDICATIONS: See list  DIETARY INTAKE:  24-hr recall:  B ( AM): Oatmeal bar, or unsweet tea or water, Snk ( AM):  L ( PM): Chicken salad on wheat bread, tomato and sweet tea  Snack; apple  D ( PM)  Can't remember none Snk ( PM): Usual physical activity: none.  Estimated energy needs: 1500 calories 170 g carbohydrates 112 g protein 42 g fat  Progress Towards Goal(s):  In progress.   Nutritional Diagnosis:  NB-1.1 Food and nutrition-related knowledge deficit As related to Diabetes.  As evidenced by A1C 10.7%.    Intervention:  Nutrition counseling meal planning and My Plate for better balanced meals. Reviewed target ranges for blood sugars and prevention of complications. Stressed need to take u500 20 minutes before her  meals and test BS before meals and at bedtime. Social worker confirmed teaching and willing to remove all other insulin out of her house.  Stressed need for more walking, drinking only water and more low carb vegetables .  Plan: Follow Plate Method Goals 1. Eat three meals per day at times discussed. 2. Increase fresh fruits and low carb vegetables. 3. Drink only water 4.  Cut out sweet team 5. Walk 30 minutes a day.   Teaching Method Utilized: Visual Auditory Hands on  Handouts given during visit include: The Plate Method Meal Plan Card  Barriers to learning/adherence to lifestyle change: mental handicap  Demonstrated degree of understanding via:  Teach Back   Monitoring/Evaluation:  Dietary intake, exercise, meal planning, and body weight in 4 weeks.

## 2016-01-20 NOTE — Patient Instructions (Signed)
Goals 1. Eat three meals per day at times discussed. 2. Increase fresh fruits and low carb vegetables. 3. Drink only water 4.  Cut out sweet team 5. Walk 30 minutes a day.

## 2016-01-20 NOTE — Telephone Encounter (Signed)
[name] wants to know if you still want her to take the 2 white pills? She didn't know the name of the pills though.Marland KitchenMarland Kitchen

## 2016-01-20 NOTE — Telephone Encounter (Signed)
Patient aware that she should continue Metformin.

## 2016-01-20 NOTE — Progress Notes (Signed)
Subjective:    Patient ID: [name], female    DOB: May 02, 1955, PCP Rosita Fire, MD   Past Medical History:  Diagnosis Date  . CHF (congestive heart failure) ([place])   . Diabetes mellitus without complication ([place])   . Hypertension   . Iron deficiency anemia 10/16/2010  . Mental handicap 10/16/2010   Past Surgical History:  Procedure Laterality Date  . ABDOMINAL HYSTERECTOMY    . CESAREAN SECTION    . CHOLECYSTECTOMY    . COLONOSCOPY  08/2010   normal TI, sigmoid polyp (adenoma ). Next TCS due  08/2015,  . ESOPHAGOGASTRODUODENOSCOPY  08/2010   antral and duodenal erosions s/p bx (chronic gastritis, no h.pylori, no celiac dz ), hiatal hernia  . KNEE SURGERY     right knee @ 60 years of age  . LEFT AND RIGHT HEART CATHETERIZATION WITH CORONARY ANGIOGRAM N/A 09/21/2011   Procedure: LEFT AND RIGHT HEART CATHETERIZATION WITH CORONARY ANGIOGRAM;  Surgeon: Birdie Riddle, MD;  Location: [facility];  Service: Cardiovascular;  Laterality: N/A;   Social History   Social History  . Marital status: Divorced    Spouse name: N/A  . Number of children: 1  . Years of education: N/A   Occupational History  .  Unemployed   Social History Main Topics  . Smoking status: Former Smoker    Packs/day: 1.00    Years: 0.00    Types: Cigarettes  . Smokeless tobacco: Never Used     Comment: quit a couple year ago  . Alcohol use No  . Drug use: No  . Sexual activity: No   Other Topics Concern  . None   Social History Narrative   Lives with parents.    Outpatient Encounter Prescriptions as of 01/20/2016  Medication Sig  . aspirin 81 MG tablet Take 81 mg by mouth once.  . Choline Fenofibrate (FENOFIBRIC ACID) 135 MG CPDR Take 1 capsule by mouth daily.  . digoxin (LANOXIN) 0.125 MG tablet Take 0.125 mg by mouth daily.  . furosemide (LASIX) 40 MG tablet Take 40 mg by mouth daily.  . insulin regular human CONCENTRATED (HUMULIN R U-500 KWIKPEN) 500 UNIT/ML kwikpen Inject 60 Units  into the skin 3 (three) times daily with meals.  . isosorbide-hydrALAZINE (BIDIL) 20-37.5 MG per tablet Take 1 tablet by mouth 3 (three) times daily.  Marland Kitchen lisinopril (PRINIVIL,ZESTRIL) 20 MG tablet Take 20 mg by mouth daily.  Marland Kitchen loratadine (CLARITIN) 10 MG tablet Take 10 mg by mouth daily.  . metFORMIN (GLUCOPHAGE) 500 MG tablet Take 1 tablet (500 mg total) by mouth 2 (two) times daily with a meal.  . [DISCONTINUED] insulin regular human CONCENTRATED (HUMULIN R U-500 KWIKPEN) 500 UNIT/ML kwikpen Inject 50 Units into the skin 3 (three) times daily with meals.   No facility-administered encounter medications on file as of 01/20/2016.    ALLERGIES: No Known Allergies VACCINATION STATUS: Immunization History  Administered Date(s) Administered  . Influenza,inj,Quad PF,36+ Mos 02/02/2013    Diabetes  She presents for her follow-up diabetic visit. She has type 2 diabetes mellitus. Onset time: she was diagnosed at approximate age of 48 years. Her disease course has been improving. There are no hypoglycemic associated symptoms. Pertinent negatives for hypoglycemia include no confusion, headaches, pallor or seizures. Associated symptoms include polydipsia and polyuria. Pertinent negatives for diabetes include no blurred vision, no chest pain, no fatigue and no polyphagia. There are no hypoglycemic complications. Symptoms are improving. Diabetic complications include nephropathy. Risk factors for coronary  artery disease include diabetes mellitus, dyslipidemia, hypertension, obesity and sedentary lifestyle. Current diabetic treatment includes intensive insulin program. Her weight is stable (No success on weight loss.). She is following a generally unhealthy diet. She has had a previous visit with a dietitian. She never participates in exercise. Home blood sugar record trend: Lately, she is skipping some mealtime insulin opportunities. She has a general fear of insulin/hypoglycemia. Her breakfast blood glucose  range is generally >200 mg/dl. Her lunch blood glucose range is generally >200 mg/dl. Her dinner blood glucose range is generally >200 mg/dl. Her highest blood glucose is >200 mg/dl. Her overall blood glucose range is >200 mg/dl. An ACE inhibitor/angiotensin II receptor blocker is being taken.  Hypertension  This is a chronic problem. The current episode started more than 1 year ago. The problem is controlled. Pertinent negatives include no blurred vision, chest pain, headaches, palpitations or shortness of breath. Risk factors for coronary artery disease include diabetes mellitus, dyslipidemia, obesity and sedentary lifestyle. Past treatments include ACE inhibitors.     Review of Systems  Constitutional: Positive for unexpected weight change. Negative for chills, fatigue and fever.  HENT: Negative for trouble swallowing and voice change.   Eyes: Negative for blurred vision and visual disturbance.  Respiratory: Negative for cough, shortness of breath and wheezing.   Cardiovascular: Negative for chest pain, palpitations and leg swelling.  Gastrointestinal: Negative for diarrhea, nausea and vomiting.  Endocrine: Positive for polydipsia and polyuria. Negative for cold intolerance, heat intolerance and polyphagia.  Musculoskeletal: Negative for arthralgias and myalgias.  Skin: Negative for color change, pallor, rash and wound.  Neurological: Negative for seizures and headaches.  Psychiatric/Behavioral: Negative for confusion and suicidal ideas.    Objective:    BP 134/79   Pulse 87   Ht '5\' 3"'$  (1.6 m)   Wt 239 lb (108.4 kg)   BMI 42.34 kg/m   Wt Readings from Last 3 Encounters:  01/20/16 239 lb (108.4 kg)  01/09/16 239 lb (108.4 kg)  01/08/16 239 lb (108.4 kg)    Physical Exam  Constitutional: She is oriented to person, place, and time. She appears well-developed.  HENT:  Head: Normocephalic and atraumatic.  Eyes: EOM are normal.  Neck: Normal range of motion. Neck supple. No  tracheal deviation present. No thyromegaly present.  Cardiovascular: Normal rate and regular rhythm.   Pulmonary/Chest: Effort normal and breath sounds normal.  Abdominal: Soft. Bowel sounds are normal. There is no tenderness. There is no guarding.  Musculoskeletal: Normal range of motion. She exhibits no edema.  Neurological: She is alert and oriented to person, place, and time. She has normal reflexes. No cranial nerve deficit. Coordination normal.  Skin: Skin is warm and dry. No rash noted. No erythema. No pallor.  Psychiatric: She has a normal mood and affect. Judgment normal.    Results for orders placed or performed in visit on 01/01/16  COMPLETE METABOLIC PANEL WITH GFR  Result Value Ref Range   Sodium 138 135 - 146 mmol/L   Potassium 4.3 3.5 - 5.3 mmol/L   Chloride 103 98 - 110 mmol/L   CO2 23 20 - 31 mmol/L   Glucose, Bld 182 (H) 65 - 99 mg/dL   BUN 38 (H) 7 - 25 mg/dL   Creat 1.15 (H) 0.50 - 0.99 mg/dL   Total Bilirubin 0.2 0.2 - 1.2 mg/dL   Alkaline Phosphatase 77 33 - 130 U/L   AST 25 10 - 35 U/L   ALT 29 6 - 29 U/L  Total Protein 7.2 6.1 - 8.1 g/dL   Albumin 4.0 3.6 - 5.1 g/dL   Calcium 9.4 8.6 - 10.4 mg/dL   GFR, Est African American 60 >=60 mL/min   GFR, Est Non African American 52 (L) >=60 mL/min  Hemoglobin A1c  Result Value Ref Range   Hgb A1c MFr Bld 8.8 (H) <5.7 %   Mean Plasma Glucose 206 mg/dL   Complete Blood Count (Most recent): Lab Results  Component Value Date   WBC 8.4 02/02/2013   HGB 11.0 (L) 02/02/2013   HCT 32.7 (L) 02/02/2013   MCV 89.1 02/02/2013   PLT 254 02/02/2013   Chemistry (most recent): Lab Results  Component Value Date   NA 138 01/01/2016   K 4.3 01/01/2016   CL 103 01/01/2016   CO2 23 01/01/2016   BUN 38 (H) 01/01/2016   CREATININE 1.15 (H) 01/01/2016   Diabetic Labs (most recent): Lab Results  Component Value Date   HGBA1C 8.8 (H) 01/01/2016   HGBA1C 9.7 (H) 09/24/2015   HGBA1C 9.6 (H) 06/24/2015     Assessment  & Plan:   1. Type 2 diabetes mellitus with stage 2 chronic kidney disease, with long-term current use of insulin (HCC)   -Her diabetes is  complicated by CKD and patient remains at a high risk for more acute and chronic complications of diabetes which include CAD, CVA, CKD, retinopathy, and neuropathy. These are all discussed in detail with the patient.  Patient came with  likely better however persistently elevated glucose profile despite large dose of insulin. - Her A1c has Improved to 8.8 %,   Overall improved  from 14.1%. -  Glucose logs and insulin administration records pertaining to this visit,  to be scanned into patient's records.    Recent labs reviewed.   - I have re-counseled the patient on diet management and weight loss  by adopting a carbohydrate restricted / protein rich  Diet.  - Suggestion is made for patient to avoid simple carbohydrates   from their diet including Cakes , Desserts, Ice Cream,  Soda (  diet and regular) , Sweet Tea , Candies,  Chips, Cookies, Artificial Sweeteners,   and "Sugar-free" Products .  This will help patient to have stable blood glucose profile and potentially avoid unintended  Weight gain.  - Patient is advised to stick to a routine mealtimes to eat 3 meals  a day and avoid unnecessary snacks ( to snack only to correct hypoglycemia).  - The patient  Has been   scheduled with Jearld Fenton, RDN, CDE for individualized DM education.  - I have approached patient with the following individualized plan to manage diabetes and patient agrees. -there   Is a a big concern that she may not be taking her insulin for fear of hypoglycemia, no needle marks on her skin.  -  There is some  Skin evidence that she is administering insulin this time.   - She still has serious dietary indiscretion consumes a lot of processed carbs.  -  Despite up to 280 units of U100 insulin daily, she continues to have persistent hyperglycemia. - She will be switched to U500  insulin , stopping Levemir and Humalog. - I would initiate U500 60 units 3 times a day before meals associated with monitoring of blood glucose before meals and at bedtime. - She is advised to skip insulin if pre-meal blood glucose is below 90 mg/dL. -Continue Metformin '500mg'$  po BID.  - Patient specific target  for  A1c; LDL, HDL, Triglycerides, and  Waist Circumference were discussed in detail.  2) BP/HTN: controlled. Continue current medications including ACEI/ARB. 3) Lipids/HPL:  continue statins. 4)  Weight/Diet: CDE consult in progress, exercise, and carbohydrates information provided.  5) Chronic Care/Health Maintenance:  -Patient is  on ACEI and encouraged to continue to follow up with Ophthalmology, Podiatrist at least yearly or according to recommendations, and advised to stay away from smoking. I have recommended yearly flu vaccine and pneumonia vaccination at least every 5 years; moderate intensity exercise for up to 150 minutes weekly; and  sleep for at least 7 hours a day. -she has serious non compliance to dietary recomendations.    - 25 minutes of time was spent on the care of this patient , 50% of which was applied for counseling on diabetes complications and their preventions.  - I advised patient to maintain close follow up with [facility], MD for primary care needs.  Patient is asked to bring meter and  blood glucose logs during their next visit.   Follow up plan: -Return in about 2 weeks (around 02/03/2016).  Glade Lloyd, MD Phone: [phone]  Fax: [phone]   01/20/2016, 10:27 AM

## 2016-01-20 NOTE — Patient Instructions (Signed)

## 2016-01-20 NOTE — Telephone Encounter (Signed)
[name], If Mrs . [name] is asking about metformin, yes I want her to continue metformin 500 mg by mouth twice a day.

## 2016-02-06 ENCOUNTER — Encounter: Payer: Medicare Other | Attending: "Endocrinology | Admitting: Nutrition

## 2016-02-06 ENCOUNTER — Encounter: Payer: Self-pay | Admitting: "Endocrinology

## 2016-02-06 ENCOUNTER — Ambulatory Visit (INDEPENDENT_AMBULATORY_CARE_PROVIDER_SITE_OTHER): Payer: Medicare Other | Admitting: "Endocrinology

## 2016-02-06 VITALS — BP 130/84 | HR 81 | Ht 63.0 in | Wt 240.0 lb

## 2016-02-06 DIAGNOSIS — E1122 Type 2 diabetes mellitus with diabetic chronic kidney disease: Secondary | ICD-10-CM

## 2016-02-06 DIAGNOSIS — N182 Chronic kidney disease, stage 2 (mild): Secondary | ICD-10-CM

## 2016-02-06 DIAGNOSIS — E1165 Type 2 diabetes mellitus with hyperglycemia: Secondary | ICD-10-CM

## 2016-02-06 DIAGNOSIS — Z794 Long term (current) use of insulin: Secondary | ICD-10-CM | POA: Diagnosis present

## 2016-02-06 DIAGNOSIS — E118 Type 2 diabetes mellitus with unspecified complications: Secondary | ICD-10-CM

## 2016-02-06 DIAGNOSIS — IMO0002 Reserved for concepts with insufficient information to code with codable children: Secondary | ICD-10-CM

## 2016-02-06 DIAGNOSIS — I10 Essential (primary) hypertension: Secondary | ICD-10-CM

## 2016-02-06 MED ORDER — INSULIN REGULAR HUMAN (CONC) 500 UNIT/ML ~~LOC~~ SOPN: 70.0000 [IU] | PEN_INJECTOR | Freq: Three times a day (TID) | SUBCUTANEOUS | 2 refills | Status: DC

## 2016-02-06 NOTE — Progress Notes (Signed)
  Medical Nutrition Therapy:  Appt start time: 1030end time:  1100  Assessment:  Primary concerns today: Diabetes Type 2.  Getting up earlier now.  Trying to eat meals on time. U500 60 units per day.   A1C 8.8%,  Here with her Education officer, museum. She is trying to drink more water and cut out sweets and snacks. Getting up earlier now for breakfast and getting meds on better schedule.   Has some memory issues when it comes to remember things and what she has eaten for meals. Denies missing medications.  Lab Results  Component Value Date   HGBA1C 8.8 (H) 01/01/2016   .last Wt Readings from Last 3 Encounters:  02/06/16 240 lb (108.9 kg)  01/20/16 239 lb (108.4 kg)  01/20/16 239 lb (108.4 kg)   Ht Readings from Last 3 Encounters:  02/06/16 '5\' 3"'$  (1.6 m)  01/20/16 '5\' 6"'$  (1.676 m)  01/20/16 '5\' 3"'$  (1.6 m)   There is no height or weight on file to calculate BMI.    Preferred Learning Style  Auditory  Visual  Hands on   Learning Readiness:  Ready  Change in progress   MEDICATIONS: See list  DIETARY INTAKE:  24-hr recall:  B ( AM): Banana and milk, OR  Snk ( AM):  L ( PM):   Snack; apple  D ( PM) PImento cheese on wheat bread  none Snk ( PM): Usual physical activity: none.  Estimated energy needs: 1500 calories 170 g carbohydrates 112 g protein 42 g fat  Progress Towards Goal(s):  In progress.   Nutritional Diagnosis:  NB-1.1 Food and nutrition-related knowledge deficit As related to Diabetes.  As evidenced by A1C 10.7%.    Intervention:  Nutrition counseling meal planning and My Plate for better balanced meals. Reviewed target ranges for blood sugars and prevention of complications. Stressed need to take u500 20 minutes before her meals and test BS before meals and at bedtime. Social worker confirmed teaching and willing to remove all other insulin out of her house.  Stressed need for more walking, drinking only water and more low carb vegetables .  Plan: Follow  Plate Method Goals Only drink water with meals and at night. No milk Stick to meal schedule Increase fresh fruits and more vegetables. Increase physical activity for weight loss.  Teaching Method Utilized: Visual Auditory Hands on  Handouts given during visit include: The Plate Method Meal Plan Card  Barriers to learning/adherence to lifestyle change: mental handicap  Demonstrated degree of understanding via:  Teach Back   Monitoring/Evaluation:  Dietary intake, exercise, meal planning, and body weight in 2-3 months.Marland Kitchen

## 2016-02-06 NOTE — Patient Instructions (Addendum)
Goals Only drink water with meals and at night. No milk Stick to meal schedule Increase fresh fruits and more vegetables. Increase physical activity for weight loss.

## 2016-02-06 NOTE — Progress Notes (Signed)
Subjective:    Patient ID: [name], female    DOB: Nov 30, 1955, PCP Rosita Fire, MD   Past Medical History:  Diagnosis Date  . CHF (congestive heart failure) ([place])   . Diabetes mellitus without complication ([place])   . Hypertension   . Iron deficiency anemia 10/16/2010  . Mental handicap 10/16/2010   Past Surgical History:  Procedure Laterality Date  . ABDOMINAL HYSTERECTOMY    . CESAREAN SECTION    . CHOLECYSTECTOMY    . COLONOSCOPY  08/2010   normal TI, sigmoid polyp (adenoma ). Next TCS due  08/2015,  . ESOPHAGOGASTRODUODENOSCOPY  08/2010   antral and duodenal erosions s/p bx (chronic gastritis, no h.pylori, no celiac dz ), hiatal hernia  . KNEE SURGERY     right knee @ 60 years of age  . LEFT AND RIGHT HEART CATHETERIZATION WITH CORONARY ANGIOGRAM N/A 09/21/2011   Procedure: LEFT AND RIGHT HEART CATHETERIZATION WITH CORONARY ANGIOGRAM;  Surgeon: Birdie Riddle, MD;  Location: [facility];  Service: Cardiovascular;  Laterality: N/A;   Social History   Social History  . Marital status: Divorced    Spouse name: N/A  . Number of children: 1  . Years of education: N/A   Occupational History  .  Unemployed   Social History Main Topics  . Smoking status: Former Smoker    Packs/day: 1.00    Years: 0.00    Types: Cigarettes  . Smokeless tobacco: Never Used     Comment: quit a couple year ago  . Alcohol use No  . Drug use: No  . Sexual activity: No   Other Topics Concern  . None   Social History Narrative   Lives with parents.    Outpatient Encounter Prescriptions as of 02/06/2016  Medication Sig  . aspirin 81 MG tablet Take 81 mg by mouth once.  . Choline Fenofibrate (FENOFIBRIC ACID) 135 MG CPDR Take 1 capsule by mouth daily.  . digoxin (LANOXIN) 0.125 MG tablet Take 0.125 mg by mouth daily.  . furosemide (LASIX) 40 MG tablet Take 40 mg by mouth daily.  . insulin regular human CONCENTRATED (HUMULIN R U-500 KWIKPEN) 500 UNIT/ML kwikpen Inject 70 Units into  the skin 3 (three) times daily with meals.  . isosorbide-hydrALAZINE (BIDIL) 20-37.5 MG per tablet Take 1 tablet by mouth 3 (three) times daily.  Marland Kitchen lisinopril (PRINIVIL,ZESTRIL) 20 MG tablet Take 20 mg by mouth daily.  Marland Kitchen loratadine (CLARITIN) 10 MG tablet Take 10 mg by mouth daily.  . metFORMIN (GLUCOPHAGE) 500 MG tablet Take 1 tablet (500 mg total) by mouth 2 (two) times daily with a meal.  . [DISCONTINUED] insulin regular human CONCENTRATED (HUMULIN R U-500 KWIKPEN) 500 UNIT/ML kwikpen Inject 60 Units into the skin 3 (three) times daily with meals.   No facility-administered encounter medications on file as of 02/06/2016.    ALLERGIES: No Known Allergies VACCINATION STATUS: Immunization History  Administered Date(s) Administered  . Influenza,inj,Quad PF,36+ Mos 02/02/2013    Diabetes  She presents for her follow-up diabetic visit. She has type 2 diabetes mellitus. Onset time: she was diagnosed at approximate age of 4 years. Her disease course has been improving. There are no hypoglycemic associated symptoms. Pertinent negatives for hypoglycemia include no confusion, headaches, pallor or seizures. Associated symptoms include polydipsia and polyuria. Pertinent negatives for diabetes include no blurred vision, no chest pain, no fatigue and no polyphagia. There are no hypoglycemic complications. Symptoms are improving. Diabetic complications include nephropathy. Risk factors for coronary  artery disease include diabetes mellitus, dyslipidemia, hypertension, obesity and sedentary lifestyle. Current diabetic treatment includes intensive insulin program. Her weight is stable (No success on weight loss.). She is following a generally unhealthy diet. She has had a previous visit with a dietitian. She never participates in exercise. Home blood sugar record trend: Lately, she is skipping some mealtime insulin opportunities. She has a general fear of insulin/hypoglycemia. Her breakfast blood glucose range is  generally >200 mg/dl. Her lunch blood glucose range is generally 180-200 mg/dl. Her dinner blood glucose range is generally >200 mg/dl. Her overall blood glucose range is >200 mg/dl. An ACE inhibitor/angiotensin II receptor blocker is being taken.  Hypertension  This is a chronic problem. The current episode started more than 1 year ago. The problem is controlled. Pertinent negatives include no blurred vision, chest pain, headaches, palpitations or shortness of breath. Risk factors for coronary artery disease include diabetes mellitus, dyslipidemia, obesity and sedentary lifestyle. Past treatments include ACE inhibitors.     Review of Systems  Constitutional: Positive for unexpected weight change. Negative for chills, fatigue and fever.  HENT: Negative for trouble swallowing and voice change.   Eyes: Negative for blurred vision and visual disturbance.  Respiratory: Negative for cough, shortness of breath and wheezing.   Cardiovascular: Negative for chest pain, palpitations and leg swelling.  Gastrointestinal: Negative for diarrhea, nausea and vomiting.  Endocrine: Positive for polydipsia and polyuria. Negative for cold intolerance, heat intolerance and polyphagia.  Musculoskeletal: Negative for arthralgias and myalgias.  Skin: Negative for color change, pallor, rash and wound.  Neurological: Negative for seizures and headaches.  Psychiatric/Behavioral: Negative for confusion and suicidal ideas.    Objective:    BP 130/84   Pulse 81   Ht '5\' 3"'$  (1.6 m)   Wt 240 lb (108.9 kg)   BMI 42.51 kg/m   Wt Readings from Last 3 Encounters:  02/06/16 240 lb (108.9 kg)  01/20/16 239 lb (108.4 kg)  01/20/16 239 lb (108.4 kg)    Physical Exam  Constitutional: She is oriented to person, place, and time. She appears well-developed.  HENT:  Head: Normocephalic and atraumatic.  Eyes: EOM are normal.  Neck: Normal range of motion. Neck supple. No tracheal deviation present. No thyromegaly present.   Cardiovascular: Normal rate and regular rhythm.   Pulmonary/Chest: Effort normal and breath sounds normal.  Abdominal: Soft. Bowel sounds are normal. There is no tenderness. There is no guarding.  Musculoskeletal: Normal range of motion. She exhibits no edema.  Neurological: She is alert and oriented to person, place, and time. She has normal reflexes. No cranial nerve deficit. Coordination normal.  Skin: Skin is warm and dry. No rash noted. No erythema. No pallor.  Psychiatric: She has a normal mood and affect. Judgment normal.    Results for orders placed or performed in visit on 01/01/16  COMPLETE METABOLIC PANEL WITH GFR  Result Value Ref Range   Sodium 138 135 - 146 mmol/L   Potassium 4.3 3.5 - 5.3 mmol/L   Chloride 103 98 - 110 mmol/L   CO2 23 20 - 31 mmol/L   Glucose, Bld 182 (H) 65 - 99 mg/dL   BUN 38 (H) 7 - 25 mg/dL   Creat 1.15 (H) 0.50 - 0.99 mg/dL   Total Bilirubin 0.2 0.2 - 1.2 mg/dL   Alkaline Phosphatase 77 33 - 130 U/L   AST 25 10 - 35 U/L   ALT 29 6 - 29 U/L   Total Protein 7.2 6.1 - 8.1  g/dL   Albumin 4.0 3.6 - 5.1 g/dL   Calcium 9.4 8.6 - 10.4 mg/dL   GFR, Est African American 60 >=60 mL/min   GFR, Est Non African American 52 (L) >=60 mL/min  Hemoglobin A1c  Result Value Ref Range   Hgb A1c MFr Bld 8.8 (H) <5.7 %   Mean Plasma Glucose 206 mg/dL   Complete Blood Count (Most recent): Lab Results  Component Value Date   WBC 8.4 02/02/2013   HGB 11.0 (L) 02/02/2013   HCT 32.7 (L) 02/02/2013   MCV 89.1 02/02/2013   PLT 254 02/02/2013   Chemistry (most recent): Lab Results  Component Value Date   NA 138 01/01/2016   K 4.3 01/01/2016   CL 103 01/01/2016   CO2 23 01/01/2016   BUN 38 (H) 01/01/2016   CREATININE 1.15 (H) 01/01/2016   Diabetic Labs (most recent): Lab Results  Component Value Date   HGBA1C 8.8 (H) 01/01/2016   HGBA1C 9.7 (H) 09/24/2015   HGBA1C 9.6 (H) 06/24/2015     Assessment & Plan:   1. Type 2 diabetes mellitus with stage 2  chronic kidney disease, with long-term current use of insulin (HCC)   -Her diabetes is  complicated by CKD and patient remains at a high risk for more acute and chronic complications of diabetes which include CAD, CVA, CKD, retinopathy, and neuropathy. These are all discussed in detail with the patient.  Patient came with  likely better however persistently elevated glucose profile despite large dose of insulin. - Her  recent A1c was 8.8  %,   Overall improved  from 14.1%. -  Glucose logs and insulin administration records pertaining to this visit,  to be scanned into patient's records.    Recent labs reviewed.   - I have re-counseled the patient on diet management and weight loss  by adopting a carbohydrate restricted / protein rich  Diet.  - Suggestion is made for patient to avoid simple carbohydrates   from their diet including Cakes , Desserts, Ice Cream,  Soda (  diet and regular) , Sweet Tea , Candies,  Chips, Cookies, Artificial Sweeteners,   and "Sugar-free" Products .  This will help patient to have stable blood glucose profile and potentially avoid unintended  Weight gain.  - Patient is advised to stick to a routine mealtimes to eat 3 meals  a day and avoid unnecessary snacks ( to snack only to correct hypoglycemia).  - The patient  Has been   scheduled with Jearld Fenton, RDN, CDE for individualized DM education.  - I have approached patient with the following individualized plan to manage diabetes and patient agrees. -there   Is a a big concern that she may not be taking her insulin for fear of hypoglycemia, no needle marks on her skin.  -  There is some  Skin evidence that she is administering insulin this time.   - She still has serious dietary indiscretion consumes a lot of processed carbs.  - She seems to be doing much better on Humulin U500 insulin  - I would increase  U500  To 70 units 3 times a day before meals associated with monitoring of blood glucose before meals  and at bedtime. - She is advised to skip insulin if pre-meal blood glucose is below 90 mg/dL. -Continue Metformin '500mg'$  po BID.  - Patient specific target  for A1c; LDL, HDL, Triglycerides, and  Waist Circumference were discussed in detail.  2) BP/HTN: controlled. Continue current medications  including ACEI/ARB. 3) Lipids/HPL:  continue statins. 4)  Weight/Diet: CDE consult in progress, exercise, and carbohydrates information provided.  5) Chronic Care/Health Maintenance:  -Patient is  on ACEI and encouraged to continue to follow up with Ophthalmology, Podiatrist at least yearly or according to recommendations, and advised to stay away from smoking. I have recommended yearly flu vaccine and pneumonia vaccination at least every 5 years; moderate intensity exercise for up to 150 minutes weekly; and  sleep for at least 7 hours a day. -she has serious non compliance to dietary recomendations.    - 25 minutes of time was spent on the care of this patient , 50% of which was applied for counseling on diabetes complications and their preventions.  - I advised patient to maintain close follow up with [facility], MD for primary care needs.  Patient is asked to bring meter and  blood glucose logs during their next visit.   Follow up plan: -Return in about 9 weeks (around 04/09/2016) for follow up with pre-visit labs, meter, and logs.  Glade Lloyd, MD Phone: [phone]  Fax: [phone]   02/06/2016, 11:37 AM

## 2016-02-06 NOTE — Patient Instructions (Signed)

## 2016-03-04 ENCOUNTER — Telehealth: Payer: Self-pay

## 2016-03-04 NOTE — Telephone Encounter (Signed)
Unfortunately we cannot help  fix this, this is what I have suspected and unexpected. But it is good that we have documentation.

## 2016-03-04 NOTE — Telephone Encounter (Signed)
FYI  Pts home care nurse went to [name] home today. She wanted to let us know that [name] is being very noncompliant with what she is eating. She states that she is drinking nothing but sweet tea & coke. She states that she is also hiding all of the "sugary" foods under the couch and in drawers. The nurse says that [name] can tell her exactly what she is supposed to be eating but is not doing this. They state the pt becomes very aggravated when they offer help with meals.

## 2016-03-10 ENCOUNTER — Other Ambulatory Visit: Payer: Self-pay | Admitting: "Endocrinology

## 2016-04-02 ENCOUNTER — Other Ambulatory Visit: Payer: Self-pay | Admitting: "Endocrinology

## 2016-04-02 LAB — COMPREHENSIVE METABOLIC PANEL
ALT: 21 U/L (ref 6–29)
AST: 16 U/L (ref 10–35)
Albumin: 4 g/dL (ref 3.6–5.1)
Alkaline Phosphatase: 70 U/L (ref 33–130)
BUN: 47 mg/dL — ABNORMAL HIGH (ref 7–25)
CO2: 24 mmol/L (ref 20–31)
Calcium: 9.5 mg/dL (ref 8.6–10.4)
Chloride: 101 mmol/L (ref 98–110)
Creat: 1.68 mg/dL — ABNORMAL HIGH (ref 0.50–0.99)
Glucose, Bld: 210 mg/dL — ABNORMAL HIGH (ref 65–99)
Potassium: 4.3 mmol/L (ref 3.5–5.3)
Sodium: 137 mmol/L (ref 135–146)
Total Bilirubin: 0.2 mg/dL (ref 0.2–1.2)
Total Protein: 7 g/dL (ref 6.1–8.1)

## 2016-04-02 LAB — HEMOGLOBIN A1C
Hgb A1c MFr Bld: 8.2 % — ABNORMAL HIGH (ref <5.7)
Mean Plasma Glucose: 189 mg/dL

## 2016-04-09 ENCOUNTER — Encounter: Payer: Self-pay | Admitting: "Endocrinology

## 2016-04-09 ENCOUNTER — Encounter: Payer: Medicare Other | Attending: "Endocrinology | Admitting: Nutrition

## 2016-04-09 ENCOUNTER — Ambulatory Visit (INDEPENDENT_AMBULATORY_CARE_PROVIDER_SITE_OTHER): Payer: Medicare Other | Admitting: "Endocrinology

## 2016-04-09 VITALS — BP 138/84 | HR 101 | Ht 63.0 in | Wt 241.0 lb

## 2016-04-09 DIAGNOSIS — E1122 Type 2 diabetes mellitus with diabetic chronic kidney disease: Secondary | ICD-10-CM | POA: Diagnosis not present

## 2016-04-09 DIAGNOSIS — Z794 Long term (current) use of insulin: Secondary | ICD-10-CM | POA: Insufficient documentation

## 2016-04-09 DIAGNOSIS — I10 Essential (primary) hypertension: Secondary | ICD-10-CM

## 2016-04-09 DIAGNOSIS — N182 Chronic kidney disease, stage 2 (mild): Secondary | ICD-10-CM | POA: Insufficient documentation

## 2016-04-09 DIAGNOSIS — E118 Type 2 diabetes mellitus with unspecified complications: Secondary | ICD-10-CM

## 2016-04-09 DIAGNOSIS — IMO0002 Reserved for concepts with insufficient information to code with codable children: Secondary | ICD-10-CM

## 2016-04-09 DIAGNOSIS — E1165 Type 2 diabetes mellitus with hyperglycemia: Secondary | ICD-10-CM

## 2016-04-09 MED ORDER — DEXAMETHASONE 1 MG PO TABS: 1.0000 mg | ORAL_TABLET | Freq: Once | ORAL | 0 refills | Status: AC

## 2016-04-09 MED ORDER — INSULIN REGULAR HUMAN (CONC) 500 UNIT/ML ~~LOC~~ SOPN: PEN_INJECTOR | SUBCUTANEOUS | 0 refills | Status: DC

## 2016-04-09 NOTE — Progress Notes (Signed)
Subjective:    Patient ID: [name], female    DOB: June 17, 1955, PCP Rosita Fire, MD   Past Medical History:  Diagnosis Date  . CHF (congestive heart failure) ([place])   . Diabetes mellitus without complication ([place])   . Hypertension   . Iron deficiency anemia 10/16/2010  . Mental handicap 10/16/2010   Past Surgical History:  Procedure Laterality Date  . ABDOMINAL HYSTERECTOMY    . CESAREAN SECTION    . CHOLECYSTECTOMY    . COLONOSCOPY  08/2010   normal TI, sigmoid polyp (adenoma ). Next TCS due  08/2015,  . ESOPHAGOGASTRODUODENOSCOPY  08/2010   antral and duodenal erosions s/p bx (chronic gastritis, no h.pylori, no celiac dz ), hiatal hernia  . KNEE SURGERY     right knee @ 61 years of age  . LEFT AND RIGHT HEART CATHETERIZATION WITH CORONARY ANGIOGRAM N/A 09/21/2011   Procedure: LEFT AND RIGHT HEART CATHETERIZATION WITH CORONARY ANGIOGRAM;  Surgeon: Birdie Riddle, MD;  Location: [facility];  Service: Cardiovascular;  Laterality: N/A;   Social History   Social History  . Marital status: Divorced    Spouse name: N/A  . Number of children: 1  . Years of education: N/A   Occupational History  .  Unemployed   Social History Main Topics  . Smoking status: Former Smoker    Packs/day: 1.00    Years: 0.00    Types: Cigarettes  . Smokeless tobacco: Never Used     Comment: quit a couple year ago  . Alcohol use No  . Drug use: No  . Sexual activity: No   Other Topics Concern  . None   Social History Narrative   Lives with parents.    Outpatient Encounter Prescriptions as of 04/09/2016  Medication Sig  . ACCU-CHEK AVIVA PLUS test strip USE TO TEST FOUR TIMES DAILY.  Marland Kitchen aspirin 81 MG tablet Take 81 mg by mouth once.  . Choline Fenofibrate (FENOFIBRIC ACID) 135 MG CPDR Take 1 capsule by mouth daily.  Marland Kitchen dexamethasone (DECADRON) 1 MG tablet Take 1 tablet (1 mg total) by mouth once.  . digoxin (LANOXIN) 0.125 MG tablet Take 0.125 mg by mouth daily.  . furosemide  (LASIX) 40 MG tablet Take 40 mg by mouth daily.  . insulin regular human CONCENTRATED (HUMULIN R U-500 KWIKPEN) 500 UNIT/ML kwikpen Inject 70 units with breakfast, 70 units with lunch, and 80 units with supper when blood glucose is above 90.  . isosorbide-hydrALAZINE (BIDIL) 20-37.5 MG per tablet Take 1 tablet by mouth 3 (three) times daily.  Marland Kitchen lisinopril (PRINIVIL,ZESTRIL) 20 MG tablet Take 20 mg by mouth daily.  Marland Kitchen loratadine (CLARITIN) 10 MG tablet Take 10 mg by mouth daily.  . metFORMIN (GLUCOPHAGE) 500 MG tablet Take 1 tablet (500 mg total) by mouth 2 (two) times daily with a meal.  . [DISCONTINUED] insulin regular human CONCENTRATED (HUMULIN R U-500 KWIKPEN) 500 UNIT/ML kwikpen Inject 70 Units into the skin 3 (three) times daily with meals.   No facility-administered encounter medications on file as of 04/09/2016.    ALLERGIES: No Known Allergies VACCINATION STATUS: Immunization History  Administered Date(s) Administered  . Influenza,inj,Quad PF,36+ Mos 02/02/2013    Diabetes  She presents for her follow-up diabetic visit. She has type 2 diabetes mellitus. Onset time: she was diagnosed at approximate age of 74 years. Her disease course has been improving. There are no hypoglycemic associated symptoms. Pertinent negatives for hypoglycemia include no confusion, headaches, pallor or seizures. Associated  symptoms include polydipsia and polyuria. Pertinent negatives for diabetes include no blurred vision, no chest pain, no fatigue and no polyphagia. There are no hypoglycemic complications. Symptoms are improving. Diabetic complications include nephropathy. Risk factors for coronary artery disease include diabetes mellitus, dyslipidemia, hypertension, obesity and sedentary lifestyle. Current diabetic treatment includes intensive insulin program. Her weight is stable (No success on weight loss.). She is following a generally unhealthy diet. She has had a previous visit with a dietitian. She never  participates in exercise. Home blood sugar record trend: Lately, she is skipping some mealtime insulin opportunities. She has a general fear of insulin/hypoglycemia. Her breakfast blood glucose range is generally >200 mg/dl. Her lunch blood glucose range is generally 180-200 mg/dl. Her dinner blood glucose range is generally 180-200 mg/dl. Her overall blood glucose range is >200 mg/dl. An ACE inhibitor/angiotensin II receptor blocker is being taken.  Hypertension  This is a chronic problem. The current episode started more than 1 year ago. The problem is controlled. Pertinent negatives include no blurred vision, chest pain, headaches, palpitations or shortness of breath. Risk factors for coronary artery disease include diabetes mellitus, dyslipidemia, obesity and sedentary lifestyle. Past treatments include ACE inhibitors.     Review of Systems  Constitutional: Positive for unexpected weight change. Negative for chills, fatigue and fever.  HENT: Negative for trouble swallowing and voice change.   Eyes: Negative for blurred vision and visual disturbance.  Respiratory: Negative for cough, shortness of breath and wheezing.   Cardiovascular: Negative for chest pain, palpitations and leg swelling.  Gastrointestinal: Negative for diarrhea, nausea and vomiting.  Endocrine: Positive for polydipsia and polyuria. Negative for cold intolerance, heat intolerance and polyphagia.  Musculoskeletal: Negative for arthralgias and myalgias.  Skin: Negative for color change, pallor, rash and wound.  Neurological: Negative for seizures and headaches.  Psychiatric/Behavioral: Negative for confusion and suicidal ideas.    Objective:    BP 138/84   Pulse (!) 101   Ht '5\' 3"'$  (1.6 m)   Wt 241 lb (109.3 kg)   BMI 42.69 kg/m   Wt Readings from Last 3 Encounters:  04/09/16 241 lb (109.3 kg)  02/06/16 240 lb (108.9 kg)  01/20/16 239 lb (108.4 kg)    Physical Exam  Constitutional: She is oriented to person, place,  and time. She appears well-developed.  HENT:  Head: Normocephalic and atraumatic.  Eyes: EOM are normal.  Neck: Normal range of motion. Neck supple. No tracheal deviation present. No thyromegaly present.  Cardiovascular: Normal rate and regular rhythm.   Pulmonary/Chest: Effort normal and breath sounds normal.  Abdominal: Soft. Bowel sounds are normal. There is no tenderness. There is no guarding.  Musculoskeletal: Normal range of motion. She exhibits no edema.  Neurological: She is alert and oriented to person, place, and time. She has normal reflexes. No cranial nerve deficit. Coordination normal.  Skin: Skin is warm and dry. No rash noted. No erythema. No pallor.  Psychiatric: She has a normal mood and affect. Judgment normal.    Results for orders placed or performed in visit on 04/02/16  Comprehensive metabolic panel  Result Value Ref Range   Sodium 137 135 - 146 mmol/L   Potassium 4.3 3.5 - 5.3 mmol/L   Chloride 101 98 - 110 mmol/L   CO2 24 20 - 31 mmol/L   Glucose, Bld 210 (H) 65 - 99 mg/dL   BUN 47 (H) 7 - 25 mg/dL   Creat 1.68 (H) 0.50 - 0.99 mg/dL   Total Bilirubin 0.2 0.2 -  1.2 mg/dL   Alkaline Phosphatase 70 33 - 130 U/L   AST 16 10 - 35 U/L   ALT 21 6 - 29 U/L   Total Protein 7.0 6.1 - 8.1 g/dL   Albumin 4.0 3.6 - 5.1 g/dL   Calcium 9.5 8.6 - 10.4 mg/dL  Hemoglobin A1c  Result Value Ref Range   Hgb A1c MFr Bld 8.2 (H) <5.7 %   Mean Plasma Glucose 189 mg/dL   Complete Blood Count (Most recent): Lab Results  Component Value Date   WBC 8.4 02/02/2013   HGB 11.0 (L) 02/02/2013   HCT 32.7 (L) 02/02/2013   MCV 89.1 02/02/2013   PLT 254 02/02/2013   Chemistry (most recent): Lab Results  Component Value Date   NA 137 04/02/2016   K 4.3 04/02/2016   CL 101 04/02/2016   CO2 24 04/02/2016   BUN 47 (H) 04/02/2016   CREATININE 1.68 (H) 04/02/2016   Diabetic Labs (most recent): Lab Results  Component Value Date   HGBA1C 8.2 (H) 04/02/2016   HGBA1C 8.8 (H)  01/01/2016   HGBA1C 9.7 (H) 09/24/2015     Assessment & Plan:   1. Type 2 diabetes mellitus with stage 2 chronic kidney disease, with long-term current use of insulin (HCC)   -Her diabetes is  complicated by CKD and patient remains at a high risk for more acute and chronic complications of diabetes which include CAD, CVA, CKD, retinopathy, and neuropathy. These are all discussed in detail with the patient.  Patient came with  likely better however persistently elevated glucose profile despite large dose of insulin. - Her  recent A1c was 8.2  %,   Overall improved  from 14.1%. -  Glucose logs and insulin administration records pertaining to this visit,  to be scanned into patient's records.    Recent labs reviewed.   - I have re-counseled the patient on diet management and weight loss  by adopting a carbohydrate restricted / protein rich  Diet.  - Suggestion is made for patient to avoid simple carbohydrates   from their diet including Cakes , Desserts, Ice Cream,  Soda (  diet and regular) , Sweet Tea , Candies,  Chips, Cookies, Artificial Sweeteners,   and "Sugar-free" Products .  This will help patient to have stable blood glucose profile and potentially avoid unintended  Weight gain.  - Patient is advised to stick to a routine mealtimes to eat 3 meals  a day and avoid unnecessary snacks ( to snack only to correct hypoglycemia).  - The patient  Has been   scheduled with Jearld Fenton, RDN, CDE for individualized DM education.  - I have approached patient with the following individualized plan to manage diabetes and patient agrees. -there   Is a a big concern that she may not be taking her insulin for fear of hypoglycemia, no needle marks on her skin.  -  There is some Skin evidence that she is administering insulin this time.   - She still has serious dietary indiscretion consumes a lot of processed carbs.  - She seems to be doing much better on Humulin U500 insulin  - I will  Continue  U500   70 units  with breakfast, 70 units with lunch, and increase it to 80 units before supper for pre-meal blood glucose above 90 associated with monitoring of blood glucose before meals and at bedtime. - She is advised to skip insulin if pre-meal blood glucose is below 90 mg/dL. -Continue Metformin  $'500mg'G$  po BID. - I will do 1 mg dexamethasone suppression test to screen for hypercortisolism given her high demand for insulin.  - Patient specific target  for A1c; LDL, HDL, Triglycerides, and  Waist Circumference were discussed in detail.  2) BP/HTN: controlled. Continue current medications including ACEI/ARB. 3) Lipids/HPL:  continue statins. 4)  Weight/Diet: CDE consult in progress, exercise, and carbohydrates information provided.  5) Chronic Care/Health Maintenance:  -Patient is  on ACEI and encouraged to continue to follow up with Ophthalmology, Podiatrist at least yearly or according to recommendations, and advised to stay away from smoking. I have recommended yearly flu vaccine and pneumonia vaccination at least every 5 years; moderate intensity exercise for up to 150 minutes weekly; and  sleep for at least 7 hours a day. -she has serious non compliance to dietary recomendations.    - 25 minutes of time was spent on the care of this patient , 50% of which was applied for counseling on diabetes complications and their preventions.  - I advised patient to maintain close follow up with [facility], MD for primary care needs.  Patient is asked to bring meter and  blood glucose logs during their next visit.   Follow up plan: -Return in about 2 weeks (around 04/23/2016) for meter, and logs.  Glade Lloyd, MD Phone: [phone]  Fax: [phone]   04/09/2016, 9:26 AM

## 2016-04-09 NOTE — Progress Notes (Signed)
  Medical Nutrition Therapy:  Appt start time: 930 0end time:  1000   Assessment:  Primary concerns today: Diabetes Type 2.  Getting up earlier now.  Trying to eat meals on time. Has a new aide helping her at home. Saw Dr. Dorris Fetch today. Increased insulin to U500 70 with breakfast and lunch and 80 units at supper. Social worker is here with her today. Gained 1 lb. A1C down to 8.2%. From 8.8%. Creatine up to 1.68 alevated BUN showing signs of dehydration. Getting labs redone and will rechcek GFR next visit per DR. Nida.  Diet remains excessive to meet her needs with weight gain and she is not drinking enough water. Discussed need for increase water intake and not sodas, juices, milk or tea.  Lab Results  Component Value Date   HGBA1C 8.2 (H) 04/02/2016   .last Wt Readings from Last 3 Encounters:  04/09/16 241 lb (109.3 kg)  02/06/16 240 lb (108.9 kg)  01/20/16 239 lb (108.4 kg)   Ht Readings from Last 3 Encounters:  04/09/16 '5\' 3"'$  (1.6 m)  02/06/16 '5\' 3"'$  (1.6 m)  01/20/16 '5\' 6"'$  (1.676 m)   There is no height or weight on file to calculate BMI.    Preferred Learning Style  Auditory  Visual  Hands on   Learning Readiness:  Ready  Change in progress   MEDICATIONS: See list  DIETARY INTAKE:  24-hr recall:  Eats 3 meals per day. Snacks on popcorn, LIkes to drink milk. Can't remember what she eats daily. Drinks 1-2 bottles of water daily.  Estimated energy needs: 1500 calories 170 g carbohydrates 112 g protein 42 g fat  Progress Towards Goal(s):  In progress.   Nutritional Diagnosis:  NB-1.1 Food and nutrition-related knowledge deficit As related to Diabetes.  As evidenced by A1C 10.7%.    Intervention:  Nutrition counseling meal planning and My Plate for better balanced meals. Reviewed target ranges for blood sugars and prevention of complications. Stressed need to take u500 20 minutes before her meals and test BS before meals and at bedtime. Social worker confirmed  teaching and willing to remove all other insulin out of her house.  Stressed need for more walking, drinking only water and more low carb vegetables .  Goals 1. Drink 3 bottles of water per day 2. Increase low carb vegetables. 3.Walk 30 minutes a day.  Lose 10 lbs Take insulin 20-30 units before meals  Teaching Method Utilized: Visual Auditory Hands on  Handouts given during visit include: The Plate Method Meal Plan Card  Barriers to learning/adherence to lifestyle change: mental handicap  Demonstrated degree of understanding via:  Teach Back   Monitoring/Evaluation:  Dietary intake, exercise, meal planning, and body weight in 2 weeks.Marland KitchenMarland Kitchen

## 2016-04-09 NOTE — Patient Instructions (Addendum)
Goals 1. Drink 3 bottles of water per day 2. Increase low carb vegetables. 3.Walk 30 minutes a day.  Lose 10 lbs Take insulin 20-30 units before meals

## 2016-04-09 NOTE — Patient Instructions (Signed)

## 2016-04-11 LAB — CORTISOL-AM, BLOOD: Cortisol - AM: 3.5 ug/dL — ABNORMAL LOW

## 2016-04-13 LAB — ACTH: C206 ACTH: <5 pg/mL — ABNORMAL LOW (ref 6–50)

## 2016-04-27 ENCOUNTER — Ambulatory Visit (INDEPENDENT_AMBULATORY_CARE_PROVIDER_SITE_OTHER): Payer: Medicare Other | Admitting: "Endocrinology

## 2016-04-27 ENCOUNTER — Encounter: Payer: Medicare Other | Attending: "Endocrinology | Admitting: Nutrition

## 2016-04-27 ENCOUNTER — Encounter: Payer: Self-pay | Admitting: "Endocrinology

## 2016-04-27 VITALS — Wt 240.0 lb

## 2016-04-27 VITALS — BP 131/88 | HR 98 | Ht 63.0 in | Wt 240.0 lb

## 2016-04-27 DIAGNOSIS — E1122 Type 2 diabetes mellitus with diabetic chronic kidney disease: Secondary | ICD-10-CM

## 2016-04-27 DIAGNOSIS — Z713 Dietary counseling and surveillance: Secondary | ICD-10-CM | POA: Diagnosis not present

## 2016-04-27 DIAGNOSIS — Z794 Long term (current) use of insulin: Secondary | ICD-10-CM | POA: Diagnosis not present

## 2016-04-27 DIAGNOSIS — E118 Type 2 diabetes mellitus with unspecified complications: Secondary | ICD-10-CM

## 2016-04-27 DIAGNOSIS — I10 Essential (primary) hypertension: Secondary | ICD-10-CM

## 2016-04-27 DIAGNOSIS — E270 Other adrenocortical overactivity: Secondary | ICD-10-CM

## 2016-04-27 DIAGNOSIS — N182 Chronic kidney disease, stage 2 (mild): Secondary | ICD-10-CM | POA: Diagnosis not present

## 2016-04-27 NOTE — Progress Notes (Signed)
  Medical Nutrition Therapy:  Appt start time: 26 0end time:  915 Assessment:  Primary concerns today: Diabetes Type 2.   Here with her social worker today.   Has been baking her foods more and cooking more at home and eating out less.. Has cut down on snacks and eating more soups since she has had bronchitis 2 weeks ago and was put on Prednisone and antibiotics. Says she doesn't have any taste buds and hasn't been eating as much. Has to go to grocery store because she is out some of her foods..  New aide comes  M, W and Fri..  gott a new aide last week.    Meter bought and BS logs.  70 units of u500 untis for with breakfast and dinner and 80 units at night time. Did have a 55 and 95 last week when she wasn't eating as much. She knew how to correct for low blood sugar and ate a sandwich. Does drink some 1/2 and 1/2 tea when she goes out.  Drinking water or milk, coffee other wise. Not walking due to being sick.   Lost 1 lb. A1C 8.2%, down from 8.8%.  Didn't eat breakfast this am because she is out of milk. Did NOT take her meal time insulin.    Making slow progress. Needs more low carb vegetables and increased exercise. FBS 230-280's. BS still running mid 200 mostly during day and at night now getting down to 130-140's before bed.   Lab Results  Component Value Date   HGBA1C 8.2 (H) 04/02/2016   .last Wt Readings from Last 3 Encounters:  04/09/16 241 lb (109.3 kg)  02/06/16 240 lb (108.9 kg)  01/20/16 239 lb (108.4 kg)   Ht Readings from Last 3 Encounters:  04/09/16 '5\' 3"'$  (1.6 m)  02/06/16 '5\' 3"'$  (1.6 m)  01/20/16 '5\' 6"'$  (1.676 m)   There is no height or weight on file to calculate BMI.    Preferred Learning Style  Auditory  Visual  Hands on   Learning Readiness:  Ready  Change in progress   MEDICATIONS: See list  DIETARY INTAKE:  24-hr recall:  Breakfast: Didn't eat breakfast  This morning.because she didn't have milk and didn't take insulin. Cheerios with milk usually  for breakfast.,  Lunch/Dinner: can't remember.    Estimated energy needs: 1500 calories 170 g carbohydrates 112 g protein 42 g fat  Progress Towards Goal(s):  In progress.   Nutritional Diagnosis:  NB-1.1 Food and nutrition-related knowledge deficit As related to Diabetes.  As evidenced by A1C 10.7%.    Intervention:  Nutrition counseling meal planning and My Plate for better balanced meals. Reviewed target ranges for blood sugars and prevention of complications. Stressed need to take u500 20 minutes before her meals and test BS before meals and at bedtime. Social worker confirmed teaching and willing to remove all other insulin out of her house.  Stressed need for more walking, drinking only water and more low carb vegetables .  Goals 1. Drink 3 bottles of water per day 2. Increase low carb vegetables. 3.Walk 30 minutes a day.  Lose 10 lbs Take insulin 20-30 units before meals  Teaching Method Utilized: Visual Auditory Hands on  Handouts given during visit include: The Plate Method Meal Plan Card  Barriers to learning/adherence to lifestyle change: mental handicap  Demonstrated degree of understanding via:  Teach Back   Monitoring/Evaluation:  Dietary intake, exercise, meal planning, and body weight in 3 months.Marland KitchenMarland KitchenMarland Kitchen

## 2016-04-27 NOTE — Progress Notes (Signed)
Subjective:    Patient ID: [name], female    DOB: 1955-08-18, PCP Rosita Fire, MD   Past Medical History:  Diagnosis Date  . CHF (congestive heart failure) ([place])   . Diabetes mellitus without complication ([place])   . Hypertension   . Iron deficiency anemia 10/16/2010  . Mental handicap 10/16/2010   Past Surgical History:  Procedure Laterality Date  . ABDOMINAL HYSTERECTOMY    . CESAREAN SECTION    . CHOLECYSTECTOMY    . COLONOSCOPY  08/2010   normal TI, sigmoid polyp (adenoma ). Next TCS due  08/2015,  . ESOPHAGOGASTRODUODENOSCOPY  08/2010   antral and duodenal erosions s/p bx (chronic gastritis, no h.pylori, no celiac dz ), hiatal hernia  . KNEE SURGERY     right knee @ 61 years of age  . LEFT AND RIGHT HEART CATHETERIZATION WITH CORONARY ANGIOGRAM N/A 09/21/2011   Procedure: LEFT AND RIGHT HEART CATHETERIZATION WITH CORONARY ANGIOGRAM;  Surgeon: Birdie Riddle, MD;  Location: [facility];  Service: Cardiovascular;  Laterality: N/A;   Social History   Social History  . Marital status: Divorced    Spouse name: N/A  . Number of children: 1  . Years of education: N/A   Occupational History  .  Unemployed   Social History Main Topics  . Smoking status: Former Smoker    Packs/day: 1.00    Years: 0.00    Types: Cigarettes  . Smokeless tobacco: Never Used     Comment: quit a couple year ago  . Alcohol use No  . Drug use: No  . Sexual activity: No   Other Topics Concern  . None   Social History Narrative   Lives with parents.    Outpatient Encounter Prescriptions as of 04/27/2016  Medication Sig  . ACCU-CHEK AVIVA PLUS test strip USE TO TEST FOUR TIMES DAILY.  Marland Kitchen aspirin 81 MG tablet Take 81 mg by mouth once.  . Choline Fenofibrate (FENOFIBRIC ACID) 135 MG CPDR Take 1 capsule by mouth daily.  . digoxin (LANOXIN) 0.125 MG tablet Take 0.125 mg by mouth daily.  . furosemide (LASIX) 40 MG tablet Take 40 mg by mouth daily.  . insulin regular human CONCENTRATED  (HUMULIN R U-500 KWIKPEN) 500 UNIT/ML kwikpen Inject 70 units with breakfast, 70 units with lunch, and 80 units with supper when blood glucose is above 90.  . isosorbide-hydrALAZINE (BIDIL) 20-37.5 MG per tablet Take 1 tablet by mouth 3 (three) times daily.  Marland Kitchen lisinopril (PRINIVIL,ZESTRIL) 20 MG tablet Take 20 mg by mouth daily.  Marland Kitchen loratadine (CLARITIN) 10 MG tablet Take 10 mg by mouth daily.  . metFORMIN (GLUCOPHAGE) 500 MG tablet Take 1 tablet (500 mg total) by mouth 2 (two) times daily with a meal.   No facility-administered encounter medications on file as of 04/27/2016.    ALLERGIES: No Known Allergies VACCINATION STATUS: Immunization History  Administered Date(s) Administered  . Influenza,inj,Quad PF,36+ Mos 02/02/2013    Diabetes  She presents for her follow-up diabetic visit. She has type 2 diabetes mellitus. Onset time: she was diagnosed at approximate age of 23 years. Her disease course has been improving. There are no hypoglycemic associated symptoms. Pertinent negatives for hypoglycemia include no confusion, headaches, pallor or seizures. Associated symptoms include polydipsia and polyuria. Pertinent negatives for diabetes include no blurred vision, no chest pain, no fatigue and no polyphagia. There are no hypoglycemic complications. Symptoms are improving. Diabetic complications include nephropathy. Risk factors for coronary artery disease include diabetes mellitus,  dyslipidemia, hypertension, obesity and sedentary lifestyle. Current diabetic treatment includes intensive insulin program. Her weight is stable (No success on weight loss.). She is following a generally unhealthy diet. She has had a previous visit with a dietitian. She never participates in exercise. Home blood sugar record trend: Lately, she is skipping some mealtime insulin opportunities. She has a general fear of insulin/hypoglycemia. Her breakfast blood glucose range is generally >200 mg/dl. Her lunch blood glucose range  is generally 180-200 mg/dl. Her dinner blood glucose range is generally 180-200 mg/dl. Her overall blood glucose range is >200 mg/dl. An ACE inhibitor/angiotensin II receptor blocker is being taken.  Hypertension  This is a chronic problem. The current episode started more than 1 year ago. The problem is controlled. Pertinent negatives include no blurred vision, chest pain, headaches, palpitations or shortness of breath. Risk factors for coronary artery disease include diabetes mellitus, dyslipidemia, obesity and sedentary lifestyle. Past treatments include ACE inhibitors.     Review of Systems  Constitutional: Positive for unexpected weight change. Negative for chills, fatigue and fever.  HENT: Negative for trouble swallowing and voice change.   Eyes: Negative for blurred vision and visual disturbance.  Respiratory: Negative for cough, shortness of breath and wheezing.   Cardiovascular: Negative for chest pain, palpitations and leg swelling.  Gastrointestinal: Negative for diarrhea, nausea and vomiting.  Endocrine: Positive for polydipsia and polyuria. Negative for cold intolerance, heat intolerance and polyphagia.  Musculoskeletal: Negative for arthralgias and myalgias.  Skin: Negative for color change, pallor, rash and wound.  Neurological: Negative for seizures and headaches.  Psychiatric/Behavioral: Negative for confusion and suicidal ideas.    Objective:    BP 131/88   Pulse 98   Ht '5\' 3"'$  (1.6 m)   Wt 240 lb (108.9 kg)   BMI 42.51 kg/m   Wt Readings from Last 3 Encounters:  04/27/16 240 lb (108.9 kg)  04/27/16 240 lb (108.9 kg)  04/09/16 241 lb (109.3 kg)    Physical Exam  Constitutional: She is oriented to person, place, and time. She appears well-developed.  HENT:  Head: Normocephalic and atraumatic.  Eyes: EOM are normal.  Neck: Normal range of motion. Neck supple. No tracheal deviation present. No thyromegaly present.  Cardiovascular: Normal rate and regular rhythm.    Pulmonary/Chest: Effort normal and breath sounds normal.  Abdominal: Soft. Bowel sounds are normal. There is no tenderness. There is no guarding.  Musculoskeletal: Normal range of motion. She exhibits no edema.  Neurological: She is alert and oriented to person, place, and time. She has normal reflexes. No cranial nerve deficit. Coordination normal.  Skin: Skin is warm and dry. No rash noted. No erythema. No pallor.  Psychiatric: She has a normal mood and affect. Judgment normal.    Results for orders placed or performed in visit on 04/09/16  Cortisol-am, blood  Result Value Ref Range   Cortisol - AM 3.5 (L) mcg/dL  ACTH  Result Value Ref Range   C206 ACTH <5 (L) 6 - 50 pg/mL   Complete Blood Count (Most recent): Lab Results  Component Value Date   WBC 8.4 02/02/2013   HGB 11.0 (L) 02/02/2013   HCT 32.7 (L) 02/02/2013   MCV 89.1 02/02/2013   PLT 254 02/02/2013   Chemistry (most recent): Lab Results  Component Value Date   NA 137 04/02/2016   K 4.3 04/02/2016   CL 101 04/02/2016   CO2 24 04/02/2016   BUN 47 (H) 04/02/2016   CREATININE 1.68 (H) 04/02/2016  Diabetic Labs (most recent): Lab Results  Component Value Date   HGBA1C 8.2 (H) 04/02/2016   HGBA1C 8.8 (H) 01/01/2016   HGBA1C 9.7 (H) 09/24/2015     Assessment & Plan:   1. Type 2 diabetes mellitus with stage 2 chronic kidney disease, with long-term current use of insulin (HCC)   -Her diabetes is  complicated by CKD and patient remains at a high risk for more acute and chronic complications of diabetes which include CAD, CVA, CKD, retinopathy, and neuropathy. These are all discussed in detail with the patient.  Patient came with  likely better however persistently elevated glucose profile despite large dose of insulin. - Her  recent A1c was 8.2  %,   Overall improved  from 14.1%. -  Glucose logs and insulin administration records pertaining to this visit,  to be scanned into patient's records.    Recent  labs reviewed.   - I have re-counseled the patient on diet management and weight loss  by adopting a carbohydrate restricted / protein rich  Diet.  - Suggestion is made for patient to avoid simple carbohydrates   from their diet including Cakes , Desserts, Ice Cream,  Soda (  diet and regular) , Sweet Tea , Candies,  Chips, Cookies, Artificial Sweeteners,   and "Sugar-free" Products .  This will help patient to have stable blood glucose profile and potentially avoid unintended  Weight gain.  - Patient is advised to stick to a routine mealtimes to eat 3 meals  a day and avoid unnecessary snacks ( to snack only to correct hypoglycemia).  - The patient  Has been   scheduled with Jearld Fenton, RDN, CDE for individualized DM education.  - I have approached patient with the following individualized plan to manage diabetes and patient agrees. -there   Is a a big concern that she may not be taking her insulin for fear of hypoglycemia, no needle marks on her skin.  -  There is some Skin evidence that she is administering insulin this time.   - She still has serious dietary indiscretion consumes a lot of processed carbs.  - She seems to be doing much better on Humulin U500 insulin  - I will Continue  U500   70 units  with breakfast, 70 units with lunch, and increase it to 80 units before supper for pre-meal blood glucose above 90 associated with monitoring of blood glucose before meals and at bedtime. - She is advised to skip insulin if pre-meal blood glucose is below 90 mg/dL. -Continue Metformin '500mg'$  po BID. - Her 1 mg dexamethasone suppression test to screen for hypercortisolism  is abnormal. - 8 AM cortisol of 3.5 associated with suppressed ACTH to less than 5. - I will proceed to obtain  CT scan of adrenals without contrast given her CKD. - She would be considered and may benefit from a new  cortisol lowering therapy Korlym, if she does not have adrenal adenoma which would require immediate  surgery. - Patient specific target  for A1c; LDL, HDL, Triglycerides, and  Waist Circumference were discussed in detail.  2) BP/HTN: controlled. Continue current medications including ACEI/ARB. 3) Lipids/HPL:  continue statins. 4)  Weight/Diet: CDE consult in progress, exercise, and carbohydrates information provided.  5) Chronic Care/Health Maintenance:  -Patient is  on ACEI and encouraged to continue to follow up with Ophthalmology, Podiatrist at least yearly or according to recommendations, and advised to stay away from smoking. I have recommended yearly flu vaccine and pneumonia  vaccination at least every 5 years; moderate intensity exercise for up to 150 minutes weekly; and  sleep for at least 7 hours a day. -she has serious non compliance to dietary recomendations.    - 25 minutes of time was spent on the care of this patient , 50% of which was applied for counseling on diabetes complications and their preventions.  - I advised patient to maintain close follow up with [facility], [facility] for primary care needs.  Patient is asked to bring meter and  blood glucose logs during their next visit.   Follow up plan: -Return in about 3 months (around 07/26/2016) for follow up with pre-visit labs, meter, and logs.  Glade Lloyd, MD Phone: [phone]  Fax: [phone]   04/27/2016, 11:31 AM

## 2016-04-27 NOTE — Patient Instructions (Signed)
Goals 1. Increase low carb vegetables 2. Eat protein of eggs greek yogurt  3. Keep drinking water 4.Exercise 30 minutes 5. Cut out tea 1/2 and 1/2 tea. Get A1C 7.5 %.

## 2016-04-27 NOTE — Patient Instructions (Signed)

## 2016-04-29 ENCOUNTER — Telehealth: Payer: Self-pay

## 2016-04-29 NOTE — Telephone Encounter (Signed)
Social worker Rip Harbour was given appt for [name] CT which is scheduled on 05/12/16 @ 10:00

## 2016-05-05 ENCOUNTER — Other Ambulatory Visit: Payer: Self-pay | Admitting: "Endocrinology

## 2016-05-12 ENCOUNTER — Ambulatory Visit (HOSPITAL_COMMUNITY): Payer: Medicare Other

## 2016-05-21 ENCOUNTER — Ambulatory Visit (HOSPITAL_COMMUNITY)
Admission: RE | Admit: 2016-05-21 | Discharge: 2016-05-21 | Disposition: A | Discharge status: Home / Self Care | Payer: Medicare Other | Source: Ambulatory Visit | Attending: "Endocrinology | Admitting: "Endocrinology

## 2016-05-21 DIAGNOSIS — R160 Hepatomegaly, not elsewhere classified: Secondary | ICD-10-CM | POA: Insufficient documentation

## 2016-05-21 DIAGNOSIS — D3501 Benign neoplasm of right adrenal gland: Secondary | ICD-10-CM | POA: Insufficient documentation

## 2016-05-21 DIAGNOSIS — E270 Other adrenocortical overactivity: Secondary | ICD-10-CM | POA: Diagnosis present

## 2016-06-03 ENCOUNTER — Other Ambulatory Visit: Payer: Self-pay | Admitting: "Endocrinology

## 2016-06-27 ENCOUNTER — Other Ambulatory Visit: Payer: Self-pay | Admitting: "Endocrinology

## 2016-07-12 ENCOUNTER — Other Ambulatory Visit: Payer: Self-pay | Admitting: "Endocrinology

## 2016-07-23 ENCOUNTER — Other Ambulatory Visit: Payer: Self-pay | Admitting: "Endocrinology

## 2016-07-23 LAB — COMPREHENSIVE METABOLIC PANEL
ALT: 23 U/L (ref 6–29)
AST: 20 U/L (ref 10–35)
Albumin: 4 g/dL (ref 3.6–5.1)
Alkaline Phosphatase: 71 U/L (ref 33–130)
BUN: 41 mg/dL — ABNORMAL HIGH (ref 7–25)
CO2: 24 mmol/L (ref 20–31)
Calcium: 9.1 mg/dL (ref 8.6–10.4)
Chloride: 104 mmol/L (ref 98–110)
Creat: 1.4 mg/dL — ABNORMAL HIGH (ref 0.50–0.99)
Glucose, Bld: 258 mg/dL — ABNORMAL HIGH (ref 65–99)
Potassium: 4.4 mmol/L (ref 3.5–5.3)
Sodium: 139 mmol/L (ref 135–146)
Total Bilirubin: 0.2 mg/dL (ref 0.2–1.2)
Total Protein: 6.7 g/dL (ref 6.1–8.1)

## 2016-07-24 LAB — HEMOGLOBIN A1C
Hgb A1c MFr Bld: 8.7 % — ABNORMAL HIGH (ref <5.7)
Mean Plasma Glucose: 203 mg/dL

## 2016-07-29 ENCOUNTER — Ambulatory Visit (INDEPENDENT_AMBULATORY_CARE_PROVIDER_SITE_OTHER): Payer: Medicare Other | Admitting: "Endocrinology

## 2016-07-29 ENCOUNTER — Encounter: Payer: Self-pay | Admitting: "Endocrinology

## 2016-07-29 VITALS — BP 153/97 | HR 86 | Ht 63.0 in | Wt 242.0 lb

## 2016-07-29 DIAGNOSIS — N182 Chronic kidney disease, stage 2 (mild): Secondary | ICD-10-CM | POA: Diagnosis not present

## 2016-07-29 DIAGNOSIS — I10 Essential (primary) hypertension: Secondary | ICD-10-CM

## 2016-07-29 DIAGNOSIS — E270 Other adrenocortical overactivity: Secondary | ICD-10-CM | POA: Diagnosis not present

## 2016-07-29 DIAGNOSIS — Z794 Long term (current) use of insulin: Secondary | ICD-10-CM

## 2016-07-29 DIAGNOSIS — E1122 Type 2 diabetes mellitus with diabetic chronic kidney disease: Secondary | ICD-10-CM | POA: Diagnosis not present

## 2016-07-29 NOTE — Patient Instructions (Signed)

## 2016-07-29 NOTE — Progress Notes (Signed)
Subjective:    Patient ID: [name], female    DOB: 31-May-1955, PCP Rosita Fire, MD   Past Medical History:  Diagnosis Date  . CHF (congestive heart failure) ([place])   . Diabetes mellitus without complication ([place])   . Hypertension   . Iron deficiency anemia 10/16/2010  . Mental handicap 10/16/2010   Past Surgical History:  Procedure Laterality Date  . ABDOMINAL HYSTERECTOMY    . CESAREAN SECTION    . CHOLECYSTECTOMY    . COLONOSCOPY  08/2010   normal TI, sigmoid polyp (adenoma ). Next TCS due  08/2015,  . ESOPHAGOGASTRODUODENOSCOPY  08/2010   antral and duodenal erosions s/p bx (chronic gastritis, no h.pylori, no celiac dz ), hiatal hernia  . KNEE SURGERY     right knee @ 61 years of age  . LEFT AND RIGHT HEART CATHETERIZATION WITH CORONARY ANGIOGRAM N/A 09/21/2011   Procedure: LEFT AND RIGHT HEART CATHETERIZATION WITH CORONARY ANGIOGRAM;  Surgeon: Birdie Riddle, MD;  Location: [facility];  Service: Cardiovascular;  Laterality: N/A;   Social History   Social History  . Marital status: Divorced    Spouse name: N/A  . Number of children: 1  . Years of education: N/A   Occupational History  .  Unemployed   Social History Main Topics  . Smoking status: Former Smoker    Packs/day: 1.00    Years: 0.00    Types: Cigarettes  . Smokeless tobacco: Never Used     Comment: quit a couple year ago  . Alcohol use No  . Drug use: No  . Sexual activity: No   Other Topics Concern  . None   Social History Narrative   Lives with parents.    Outpatient Encounter Prescriptions as of 07/29/2016  Medication Sig  . ACCU-CHEK AVIVA PLUS test strip USE TO TEST FOUR TIMES DAILY.  Marland Kitchen aspirin 81 MG tablet Take 81 mg by mouth once.  . Choline Fenofibrate (FENOFIBRIC ACID) 135 MG CPDR Take 1 capsule by mouth daily.  . digoxin (LANOXIN) 0.125 MG tablet Take 0.125 mg by mouth daily.  . furosemide (LASIX) 40 MG tablet Take 40 mg by mouth daily.  Marland Kitchen HUMULIN R U-500 KWIKPEN 500 UNIT/ML  kwikpen INJECT 70 UNITS INTO THE SKIN AT BREAKFAST AND LUNCH, AND 80 UNITS WITH SUPPER WHEN BLOOD SUGAR >90.  . isosorbide-hydrALAZINE (BIDIL) 20-37.5 MG per tablet Take 1 tablet by mouth 3 (three) times daily.  Marland Kitchen lisinopril (PRINIVIL,ZESTRIL) 20 MG tablet Take 20 mg by mouth daily.  Marland Kitchen loratadine (CLARITIN) 10 MG tablet Take 10 mg by mouth daily.  . metFORMIN (GLUCOPHAGE) 500 MG tablet Take 1 tablet (500 mg total) by mouth 2 (two) times daily with a meal.  . SURE COMFORT PEN NEEDLES 31G X 8 MM MISC USE AS DIRECTED WITH LEVEMIR AND NOVOLOG. UP TO FOUR TIMES DAILY.   No facility-administered encounter medications on file as of 07/29/2016.    ALLERGIES: No Known Allergies VACCINATION STATUS: Immunization History  Administered Date(s) Administered  . Influenza,inj,Quad PF,36+ Mos 02/02/2013    Diabetes  She presents for her follow-up diabetic visit. She has type 2 diabetes mellitus. Onset time: she was diagnosed at approximate age of 61 years. Her disease course has been worsening. There are no hypoglycemic associated symptoms. Pertinent negatives for hypoglycemia include no confusion, headaches, pallor or seizures. Associated symptoms include polydipsia and polyuria. Pertinent negatives for diabetes include no blurred vision, no chest pain, no fatigue and no polyphagia. There are no hypoglycemic  complications. Symptoms are worsening. Diabetic complications include nephropathy. Risk factors for coronary artery disease include diabetes mellitus, dyslipidemia, hypertension, obesity and sedentary lifestyle. Current diabetic treatment includes intensive insulin program. Her weight is stable (No success on weight loss.). She is following a generally unhealthy diet. She has had a previous visit with a dietitian. She never participates in exercise. Home blood sugar record trend: Lately, she is skipping some mealtime insulin opportunities. She has a general fear of insulin/hypoglycemia. Her breakfast blood  glucose range is generally 180-200 mg/dl. Her lunch blood glucose range is generally 180-200 mg/dl. Her dinner blood glucose range is generally 180-200 mg/dl. Her overall blood glucose range is 180-200 mg/dl. An ACE inhibitor/angiotensin II receptor blocker is being taken.  Hypertension  This is a chronic problem. The current episode started more than 1 year ago. The problem is controlled. Pertinent negatives include no blurred vision, chest pain, headaches, palpitations or shortness of breath. Risk factors for coronary artery disease include diabetes mellitus, dyslipidemia, obesity and sedentary lifestyle. Past treatments include ACE inhibitors.     Review of Systems  Constitutional: Positive for unexpected weight change. Negative for chills, fatigue and fever.  HENT: Negative for trouble swallowing and voice change.   Eyes: Negative for blurred vision and visual disturbance.  Respiratory: Negative for cough, shortness of breath and wheezing.   Cardiovascular: Negative for chest pain, palpitations and leg swelling.  Gastrointestinal: Negative for diarrhea, nausea and vomiting.  Endocrine: Positive for polydipsia and polyuria. Negative for cold intolerance, heat intolerance and polyphagia.  Musculoskeletal: Negative for arthralgias and myalgias.  Skin: Negative for color change, pallor, rash and wound.  Neurological: Negative for seizures and headaches.  Psychiatric/Behavioral: Negative for confusion and suicidal ideas.    Objective:    BP (!) 153/97   Pulse 86   Ht '5\' 3"'$  (1.6 m)   Wt 242 lb (109.8 kg)   BMI 42.87 kg/m   Wt Readings from Last 3 Encounters:  07/29/16 242 lb (109.8 kg)  04/27/16 240 lb (108.9 kg)  04/27/16 240 lb (108.9 kg)    Physical Exam  Constitutional: She is oriented to person, place, and time. She appears well-developed.  HENT:  Head: Normocephalic and atraumatic.  Eyes: EOM are normal.  Neck: Normal range of motion. Neck supple. No tracheal deviation  present. No thyromegaly present.  Cardiovascular: Normal rate and regular rhythm.   Pulmonary/Chest: Effort normal and breath sounds normal.  Abdominal: Soft. Bowel sounds are normal. There is no tenderness. There is no guarding.  Musculoskeletal: Normal range of motion. She exhibits no edema.  Neurological: She is alert and oriented to person, place, and time. She has normal reflexes. No cranial nerve deficit. Coordination normal.  Skin: Skin is warm and dry. No rash noted. No erythema. No pallor.  Psychiatric: She has a normal mood and affect. Judgment normal.    Results for orders placed or performed in visit on 07/23/16  Comprehensive metabolic panel  Result Value Ref Range   Sodium 139 135 - 146 mmol/L   Potassium 4.4 3.5 - 5.3 mmol/L   Chloride 104 98 - 110 mmol/L   CO2 24 20 - 31 mmol/L   Glucose, Bld 258 (H) 65 - 99 mg/dL   BUN 41 (H) 7 - 25 mg/dL   Creat 1.40 (H) 0.50 - 0.99 mg/dL   Total Bilirubin 0.2 0.2 - 1.2 mg/dL   Alkaline Phosphatase 71 33 - 130 U/L   AST 20 10 - 35 U/L   ALT 23 6 -  29 U/L   Total Protein 6.7 6.1 - 8.1 g/dL   Albumin 4.0 3.6 - 5.1 g/dL   Calcium 9.1 8.6 - 10.4 mg/dL  Hemoglobin A1c  Result Value Ref Range   Hgb A1c MFr Bld 8.7 (H) <5.7 %   Mean Plasma Glucose 203 mg/dL   Complete Blood Count (Most recent): Lab Results  Component Value Date   WBC 8.4 02/02/2013   HGB 11.0 (L) 02/02/2013   HCT 32.7 (L) 02/02/2013   MCV 89.1 02/02/2013   PLT 254 02/02/2013   Chemistry (most recent): Lab Results  Component Value Date   NA 139 07/23/2016   K 4.4 07/23/2016   CL 104 07/23/2016   CO2 24 07/23/2016   BUN 41 (H) 07/23/2016   CREATININE 1.40 (H) 07/23/2016   Diabetic Labs (most recent): Lab Results  Component Value Date   HGBA1C 8.7 (H) 07/23/2016   HGBA1C 8.2 (H) 04/02/2016   HGBA1C 8.8 (H) 01/01/2016     Assessment & Plan:   1. Type 2 diabetes mellitus with stage 2 chronic kidney disease, with long-term current use of insulin  (HCC)   -Her diabetes is  complicated by CKD and patient remains at a high risk for more acute and chronic complications of diabetes which include CAD, CVA, CKD, retinopathy, and neuropathy. These are all discussed in detail with the patient.  Patient came with  likely better however persistently elevated glucose profile despite large dose of insulin. - Her  recent A1c  is 8.7% increasing from 8.2  %,  After overall improved  from 14.1%. -  Glucose logs and insulin administration records pertaining to this visit,  to be scanned into patient's records.    Recent labs reviewed.   - I have re-counseled the patient on diet management and weight loss  by adopting a carbohydrate restricted / protein rich  Diet.  - Suggestion is made for patient to avoid simple carbohydrates   from their diet including Cakes , Desserts, Ice Cream,  Soda (  diet and regular) , Sweet Tea , Candies,  Chips, Cookies, Artificial Sweeteners,   and "Sugar-free" Products .  This will help patient to have stable blood glucose profile and potentially avoid unintended  Weight gain.  - Patient is advised to stick to a routine mealtimes to eat 3 meals  a day and avoid unnecessary snacks ( to snack only to correct hypoglycemia).  - The patient  Has been   scheduled with Jearld Fenton, RDN, CDE for individualized DM education.  - I have approached patient with the following individualized plan to manage diabetes and patient agrees. -there   Is a a big concern that she may not be taking her insulin for fear of hypoglycemia, no needle marks on her skin.  - She still has serious dietary indiscretion consumes a lot of processed carbs.  - She seems to be doing much better on Humulin U500 insulin  - I will Continue  U500   70 units  with breakfast, 70 units with lunch, and 80 units before supper for pre-meal blood glucose above 90 associated with monitoring of blood glucose before meals and at bedtime. - She is advised to skip  insulin if pre-meal blood glucose is below 90 mg/dL. -Continue Metformin '500mg'$  po BID. - Her 1 mg dexamethasone suppression test to screen for hypercortisolism  is abnormal. - 8 AM cortisol of 3.5 associated with suppressed ACTH to less than 5. - She has stable 2.1 cm left adrenal adenoma  which may be contributing for the hypercortisolemia. She would not require immediate surgery, in the meantime  She will be considered and may benefit from a new  cortisol lowering therapy Korlym. - This comes from the special pharmacy, paperwork filled and will be faxed today. - She will need repeat CMP 2 weeks after initiation and return for clinic visit in 3 weeks from now.  - Patient specific target  for A1c; LDL, HDL, Triglycerides, and  Waist Circumference were discussed in detail.  2) BP/HTN: controlled. Continue current medications including ACEI/ARB. 3) Lipids/HPL:  continue statins. 4)  Weight/Diet: CDE consult in progress, exercise, and carbohydrates information provided.  5) Chronic Care/Health Maintenance:  -Patient is  on ACEI and encouraged to continue to follow up with Ophthalmology, Podiatrist at least yearly or according to recommendations, and advised to stay away from smoking. I have recommended yearly flu vaccine and pneumonia vaccination at least every 5 years; moderate intensity exercise for up to 150 minutes weekly; and  sleep for at least 7 hours a day. -she has serious non compliance to dietary recomendations.    - 25 minutes of time was spent on the care of this patient , 50% of which was applied for counseling on diabetes complications and their preventions.  - I advised patient to maintain close follow up with [facility], MD for primary care needs.  Patient is asked to bring meter and  blood glucose logs during her next visit.   Follow up plan: -Return in about 3 weeks (around 08/19/2016) for follow up with pre-visit labs.  Glade Lloyd, MD Phone: [phone]  Fax:  [phone]   07/29/2016, 11:01 AM

## 2016-08-03 ENCOUNTER — Other Ambulatory Visit: Payer: Self-pay | Admitting: "Endocrinology

## 2016-08-14 ENCOUNTER — Telehealth: Payer: Self-pay | Admitting: "Endocrinology

## 2016-08-14 NOTE — Telephone Encounter (Signed)
[facility] / Prior auth form faxed.

## 2016-08-14 NOTE — Telephone Encounter (Signed)
[name] is calling from [facility] in regards to needing more info for a prior auth on [name] medication. Case # [id number] Call [phone]

## 2016-08-19 ENCOUNTER — Ambulatory Visit: Payer: Medicare Other | Admitting: "Endocrinology

## 2016-08-23 ENCOUNTER — Other Ambulatory Visit: Payer: Self-pay | Admitting: "Endocrinology

## 2016-08-27 ENCOUNTER — Ambulatory Visit: Payer: Medicare Other | Admitting: "Endocrinology

## 2016-09-15 ENCOUNTER — Other Ambulatory Visit: Payer: Self-pay | Admitting: "Endocrinology

## 2016-10-01 ENCOUNTER — Telehealth: Payer: Self-pay

## 2016-10-01 NOTE — Telephone Encounter (Signed)
Pharmacy called to make [place] aware of possible interaction between [facility] and Digoxin which was prescribed by Dr Legrand Rams. Pt has not received medication yet. It has been shipped and should arrive this week. Dr Nicky Pugh office is aware.

## 2016-10-06 NOTE — Telephone Encounter (Signed)
Noted, we need to plan to do digoxin level 2 weeks after she starts the [facility].

## 2016-10-10 ENCOUNTER — Other Ambulatory Visit: Payer: Self-pay | Admitting: "Endocrinology

## 2016-11-06 ENCOUNTER — Encounter: Payer: Self-pay | Admitting: *Deleted

## 2016-11-08 NOTE — Progress Notes (Signed)
Cardiology Office Note   Date:  11/09/2016   ID:  [name], DOB 05/20/1955, MRN [id number]  PCP:  Anne Fire, MD  Cardiologist:   Jenkins Rouge, MD   No chief complaint on file.     History of Present Illness: [name] is a 61 y.o. female who presents for consultation regarding CHF. Referred by Dr [name].  CRF;s include HTN and DM.  Notes reviewed from Dr [name] 2013 indicated "CHF: but in setting ARF, Anemia and transfusions. Last seen by Dr [name] 02/02/2013 Cath Dr [name] 2013 no CAD and echo EF 50-55% DM poorly controlled A1c 07/29/16 8.7  With stage 2 kidney disease Current daily smoker   Dig level 10/15/16 normal 1.2  Cr 1.3  A1c 8.9 LDL not calculated due to Triglycerides 499   She has a very good Education officer, museum with her today Patient has some mental disability. She has cushing's and needs To start a new med to blunt cortisol and it raises digoxin levels  She primarily complains of back and neck pain  No dyspnea, palpitations syncope or chest pain Mild chronic LE edema   Past Medical History:  Diagnosis Date  . Asthma   . CHF (congestive heart failure) ([name])   . Diabetes mellitus without complication ([name])   . Hypertension   . Iron deficiency anemia 10/16/2010  . Mental handicap 10/16/2010    Past Surgical History:  Procedure Laterality Date  . ABDOMINAL HYSTERECTOMY    . CESAREAN SECTION    . CHOLECYSTECTOMY    . COLONOSCOPY  08/2010   normal TI, sigmoid polyp (adenoma ). Next TCS due  08/2015,  . ESOPHAGOGASTRODUODENOSCOPY  08/2010   antral and duodenal erosions s/p bx (chronic gastritis, no h.pylori, no celiac dz ), hiatal hernia  . KNEE SURGERY     right knee @ 61 years of age  . LEFT AND RIGHT HEART CATHETERIZATION WITH CORONARY ANGIOGRAM N/A 09/21/2011   Procedure: LEFT AND RIGHT HEART CATHETERIZATION WITH CORONARY ANGIOGRAM;  Surgeon: Anne Riddle, MD;  Location: [facility]ATH [facility];  Service: Cardiovascular;  Laterality: N/A;     Current  Outpatient Prescriptions  Medication Sig Dispense Refill  . ACCU-CHEK AVIVA PLUS test strip USE TO TEST FOUR TIMES DAILY. 150 each 5  . ACCU-CHEK SOFTCLIX LANCETS lancets USE TO TEST FOUR TIMES DAILY. 150 each 5  . aspirin 325 MG tablet Take 325 mg by mouth daily.    . budesonide-formoterol (SYMBICORT) 160-4.5 MCG/ACT inhaler Inhale 2 puffs into the lungs 2 (two) times daily.    . Choline Fenofibrate (FENOFIBRIC ACID) 135 MG CPDR Take 1 capsule by mouth daily.    . Choline Fenofibrate (FENOFIBRIC ACID) 135 MG CPDR Take by mouth.    . digoxin (LANOXIN) 0.125 MG tablet Take 0.125 mg by mouth daily.    . furosemide (LASIX) 40 MG tablet Take 40 mg by mouth daily.    Marland Kitchen HUMULIN R U-500 KWIKPEN 500 UNIT/ML kwikpen INJECT 70 UNITS INTO THE SKIN AT BREAKFAST AND LUNCH, AND 80 UNITS WITH SUPPER WHEN BLOOD SUGAR >90. 6 mL 2  . hydrALAZINE (APRESOLINE) 25 MG tablet Take 25 mg by mouth 3 (three) times daily.    . isosorbide dinitrate (ISORDIL) 20 MG tablet Take 20 mg by mouth 3 (three) times daily.    . isosorbide-hydrALAZINE (BIDIL) 20-37.5 MG per tablet Take 1 tablet by mouth 3 (three) times daily.    Marland Kitchen lisinopril (PRINIVIL,ZESTRIL) 20 MG tablet Take 20 mg by mouth daily.    Marland Kitchen  loratadine (CLARITIN) 10 MG tablet Take 10 mg by mouth daily.    . metFORMIN (GLUCOPHAGE) 500 MG tablet Take 1 tablet (500 mg total) by mouth 2 (two) times daily with a meal. 60 tablet 2  . MiFEPRIStone (KORLYM) 300 MG TABS Take 300 mg by mouth.    . Multiple Vitamin (MULTIVITAMIN) capsule Take 1 capsule by mouth daily.    . SURE COMFORT PEN NEEDLES 31G X 8 MM MISC USE AS DIRECTED WITH LEVEMIR AND NOVOLOG. UP TO FOUR TIMES DAILY. 100 each 5   No current facility-administered medications for this visit.     Allergies:   Patient has no known allergies.    Social History:  The patient  reports that she quit smoking about 20 years ago. Her smoking use included Cigarettes. She smoked 1.00 pack per day for 0.00 years. She has never  used smokeless tobacco. She reports that she does not drink alcohol or use drugs.   Family History:  The patient's family history includes Diabetes in her mother; Hypertension in her father, mother, and son; Kidney disease in her unknown relative.    ROS:  Please see the history of present illness.   Otherwise, review of systems are positive for none.   All other systems are reviewed and negative.    PHYSICAL EXAM: VS:  BP 126/72 (BP Location: Right Arm)   Pulse (!) 114   Ht 5' 4.5" (1.638 m)   Wt 250 lb (113.4 kg)   SpO2 95%   BMI 42.25 kg/m  , BMI Body mass index is 42.25 kg/m. Affect appropriate Obese white female  HEENT: normal Neck supple with no adenopathy JVP normal no bruits no thyromegaly Lungs clear with no wheezing and good diaphragmatic motion Heart:  S1/S2 no murmur, no rub, gallop or click PMI normal Abdomen: benighn, BS positve, no tenderness, no AAA no bruit.  No HSM or HJR Distal pulses intact with no bruits Trace bilateral LE edema Neuro non-focal Skin warm and dry No muscular weakness    EKG:  02/02/13  SR rate 82 low voltage nonspecific ST changes    Recent Labs: 07/23/2016: ALT 23; BUN 41; Creat 1.40; Potassium 4.4; Sodium 139    Lipid Panel No results found for: CHOL, TRIG, HDL, CHOLHDL, VLDL, LDLCALC, LDLDIRECT    Wt Readings from Last 3 Encounters:  11/09/16 250 lb (113.4 kg)  07/29/16 242 lb (109.8 kg)  04/27/16 240 lb (108.9 kg)      Other studies Reviewed: Additional studies/ records that were reviewed today include: Notes Dr Christin Bach and [facility] cardiology Notes Dr [name] primary and labs .    ASSESSMENT AND PLAN:  1.  CHF not clear this is a real diagnosis for her. Previously fluid overload related to co morbidities Digoxin clearly not Indicated in setting of normal EF will stop and avoid any interaction with new meds for Cushing's F/U echo to assess Systolic and diastolic function  2. DM Discussed low carb diet.   Target hemoglobin A1c is 6.5 or less.  Continue current medications. 3. CRF follow labs avoid nephrotoxic drugs 4. HTN should improve more with Rx Cushing's continue current meds 5. Cushing's has adrenal adenoma by CT failed stim test start ok to start [place]    Current medicines are reviewed at length with the patient today.  The patient does not have concerns regarding medicines.  The following changes have been made:  no change  Labs/ tests ordered today include: Echo   Orders Placed This  Encounter  Procedures  . EKG 12-Lead  . ECHOCARDIOGRAM COMPLETE     Disposition:   FU with Dr Jacinta Shoe in a year      Signed, Jenkins Rouge, MD  11/09/2016 2:06 PM    [facility] [address], [place], [place]  [address] Phone: [phone]; Fax: [phone]

## 2016-11-09 ENCOUNTER — Ambulatory Visit (INDEPENDENT_AMBULATORY_CARE_PROVIDER_SITE_OTHER): Payer: Medicare Other | Admitting: Cardiovascular Disease

## 2016-11-09 ENCOUNTER — Encounter: Payer: Self-pay | Admitting: Cardiovascular Disease

## 2016-11-09 VITALS — BP 126/72 | HR 114 | Ht 64.5 in | Wt 250.0 lb

## 2016-11-09 DIAGNOSIS — I10 Essential (primary) hypertension: Secondary | ICD-10-CM

## 2016-11-09 DIAGNOSIS — I509 Heart failure, unspecified: Secondary | ICD-10-CM

## 2016-11-09 NOTE — Patient Instructions (Signed)
Medication Instructions:  STOP DIGOXIN   Labwork: NONE  Testing/Procedures: Your physician has requested that you have an echocardiogram. Echocardiography is a painless test that uses sound waves to create images of your heart. It provides your doctor with information about the size and shape of your heart and how well your heart's chambers and valves are working. This procedure takes approximately one hour. There are no restrictions for this procedure.    Follow-Up: Your physician recommends that you schedule a follow-up appointment in: AS NEEDED   Any Other Special Instructions Will Be Listed Below (If Applicable).     If you need a refill on your cardiac medications before your next appointment, please call your pharmacy.

## 2016-11-18 ENCOUNTER — Ambulatory Visit (HOSPITAL_COMMUNITY)
Admission: RE | Admit: 2016-11-18 | Discharge: 2016-11-18 | Disposition: A | Discharge status: Home / Self Care | Payer: Medicare Other | Source: Ambulatory Visit | Attending: Cardiovascular Disease | Admitting: Cardiovascular Disease

## 2016-11-18 DIAGNOSIS — I509 Heart failure, unspecified: Secondary | ICD-10-CM

## 2016-11-18 DIAGNOSIS — E119 Type 2 diabetes mellitus without complications: Secondary | ICD-10-CM | POA: Diagnosis not present

## 2016-11-18 DIAGNOSIS — I110 Hypertensive heart disease with heart failure: Secondary | ICD-10-CM | POA: Insufficient documentation

## 2016-11-18 LAB — ECHOCARDIOGRAM COMPLETE
E decel time: 218 msec
E/e' ratio: 9.62
FS: 34 % (ref 28–44)
IVS/LV PW RATIO, ED: 1.06
LA ID, A-P, ES: 32 mm
LA diam end sys: 32 mm
LA diam index: 1.37 cm/m2
LA vol A4C: 45.4 ml
LA vol index: 17.3 mL/m2
LA vol: 40.3 mL
LV E/e' medial: 9.62
LV E/e'average: 9.62
LV PW d: 11.3 mm — AB (ref 0.6–1.1)
LV dias vol index: 22 mL/m2
LV dias vol: 52 mL (ref 46–106)
LV e' LATERAL: 6.09 cm/s
LV sys vol index: 9 mL/m2
LV sys vol: 22 mL
LVOT SV: 73 mL
LVOT VTI: 23.1 cm
LVOT area: 3.14 cm2
LVOT diameter: 20 mm
LVOT peak grad rest: 6 mmHg
LVOT peak vel: 121 cm/s
Lateral S' vel: 13.5 cm/s
MV Dec: 218
MV pk A vel: 89.5 m/s
MV pk E vel: 58.6 m/s
Simpson's disk: 58
Stroke v: 30 ml
TAPSE: 17.1 mm
TDI e' lateral: 6.09
TDI e' medial: 3.7

## 2016-11-18 NOTE — Progress Notes (Signed)
*  PRELIMINARY RESULTS* Echocardiogram 2D Echocardiogram has been performed.  [name] 11/18/2016, 11:15 AM

## 2016-11-23 ENCOUNTER — Other Ambulatory Visit: Payer: Self-pay | Admitting: "Endocrinology

## 2016-11-23 LAB — COMPREHENSIVE METABOLIC PANEL
ALT: 19 U/L (ref 6–29)
AST: 18 U/L (ref 10–35)
Albumin: 4 g/dL (ref 3.6–5.1)
Alkaline Phosphatase: 63 U/L (ref 33–130)
BUN: 36 mg/dL — ABNORMAL HIGH (ref 7–25)
CO2: 29 mmol/L (ref 20–32)
Calcium: 9.2 mg/dL (ref 8.6–10.4)
Chloride: 98 mmol/L (ref 98–110)
Creat: 1.35 mg/dL — ABNORMAL HIGH (ref 0.50–0.99)
Glucose, Bld: 288 mg/dL — ABNORMAL HIGH (ref 65–99)
Potassium: 4.4 mmol/L (ref 3.5–5.3)
Sodium: 136 mmol/L (ref 135–146)
Total Bilirubin: 0.3 mg/dL (ref 0.2–1.2)
Total Protein: 6.9 g/dL (ref 6.1–8.1)

## 2016-12-02 ENCOUNTER — Ambulatory Visit: Payer: Medicare Other | Admitting: "Endocrinology

## 2016-12-05 ENCOUNTER — Other Ambulatory Visit: Payer: Self-pay | Admitting: "Endocrinology

## 2016-12-09 ENCOUNTER — Other Ambulatory Visit: Payer: Self-pay

## 2016-12-09 ENCOUNTER — Encounter: Payer: Self-pay | Admitting: "Endocrinology

## 2016-12-09 ENCOUNTER — Encounter: Payer: Medicare Other | Admitting: "Endocrinology

## 2016-12-10 NOTE — Progress Notes (Signed)
This encounter was created in error - please disregard.

## 2016-12-22 ENCOUNTER — Other Ambulatory Visit (HOSPITAL_COMMUNITY): Payer: Self-pay | Admitting: Internal Medicine

## 2016-12-22 ENCOUNTER — Ambulatory Visit (HOSPITAL_COMMUNITY)
Admission: RE | Admit: 2016-12-22 | Discharge: 2016-12-22 | Disposition: A | Discharge status: Home / Self Care | Payer: Medicare Other | Source: Ambulatory Visit | Attending: Internal Medicine | Admitting: Internal Medicine

## 2016-12-22 DIAGNOSIS — I517 Cardiomegaly: Secondary | ICD-10-CM | POA: Insufficient documentation

## 2016-12-22 DIAGNOSIS — J449 Chronic obstructive pulmonary disease, unspecified: Secondary | ICD-10-CM | POA: Insufficient documentation

## 2016-12-22 DIAGNOSIS — J984 Other disorders of lung: Secondary | ICD-10-CM | POA: Diagnosis not present

## 2016-12-24 ENCOUNTER — Ambulatory Visit (INDEPENDENT_AMBULATORY_CARE_PROVIDER_SITE_OTHER): Payer: Medicare Other | Admitting: "Endocrinology

## 2016-12-24 ENCOUNTER — Encounter: Payer: Self-pay | Admitting: "Endocrinology

## 2016-12-24 DIAGNOSIS — N182 Chronic kidney disease, stage 2 (mild): Secondary | ICD-10-CM

## 2016-12-24 DIAGNOSIS — Z794 Long term (current) use of insulin: Secondary | ICD-10-CM | POA: Diagnosis not present

## 2016-12-24 DIAGNOSIS — E1122 Type 2 diabetes mellitus with diabetic chronic kidney disease: Secondary | ICD-10-CM | POA: Diagnosis not present

## 2016-12-24 DIAGNOSIS — I10 Essential (primary) hypertension: Secondary | ICD-10-CM

## 2016-12-24 NOTE — Patient Instructions (Signed)

## 2016-12-24 NOTE — Progress Notes (Signed)
Subjective:    Patient ID: [name], female    DOB: [birth year], PCP Rosita Fire, MD   Past Medical History:  Diagnosis Date  . Asthma   . CHF (congestive heart failure) ([place])   . Diabetes mellitus without complication ([place])   . Hypertension   . Iron deficiency anemia 10/16/2010  . Mental handicap 10/16/2010   Past Surgical History:  Procedure Laterality Date  . ABDOMINAL HYSTERECTOMY    . CESAREAN SECTION    . CHOLECYSTECTOMY    . COLONOSCOPY  08/2010   normal TI, sigmoid polyp (adenoma ). Next TCS due  08/2015,  . ESOPHAGOGASTRODUODENOSCOPY  08/2010   antral and duodenal erosions s/p bx (chronic gastritis, no h.pylori, no celiac dz ), hiatal hernia  . KNEE SURGERY     right knee @ 61 years of age  . LEFT AND RIGHT HEART CATHETERIZATION WITH CORONARY ANGIOGRAM N/A 09/21/2011   Procedure: LEFT AND RIGHT HEART CATHETERIZATION WITH CORONARY ANGIOGRAM;  Surgeon: Birdie Riddle, MD;  Location: [facility];  Service: Cardiovascular;  Laterality: N/A;   Social History   Social History  . Marital status: Divorced    Spouse name: N/A  . Number of children: 1  . Years of education: N/A   Occupational History  .  Unemployed   Social History Main Topics  . Smoking status: Former Smoker    Packs/day: 1.00    Years: 0.00    Types: Cigarettes    Quit date: 04/11/1996  . Smokeless tobacco: Never Used     Comment: quit a couple year ago  . Alcohol use No  . Drug use: No  . Sexual activity: No   Other Topics Concern  . None   Social History Narrative   Lives with parents.    Outpatient Encounter Prescriptions as of 12/24/2016  Medication Sig  . ACCU-CHEK AVIVA PLUS test strip USE TO TEST FOUR TIMES DAILY.  Marland Kitchen ACCU-CHEK SOFTCLIX LANCETS lancets USE TO TEST FOUR TIMES DAILY.  Marland Kitchen aspirin 325 MG tablet Take 325 mg by mouth daily.  . budesonide-formoterol (SYMBICORT) 160-4.5 MCG/ACT inhaler Inhale 2 puffs into the lungs 2 (two) times daily.  . Choline Fenofibrate  (FENOFIBRIC ACID) 135 MG CPDR Take 1 capsule by mouth daily.  . furosemide (LASIX) 40 MG tablet Take 40 mg by mouth daily.  Marland Kitchen HUMULIN R U-500 KWIKPEN 500 UNIT/ML kwikpen INJECT 70 UNITS INTO THE SKIN AT BREAKFAST AND LUNCH, AND 80 UNITS WITH SUPPER WHEN BLOOD SUGAR >90.  . hydrALAZINE (APRESOLINE) 25 MG tablet Take 25 mg by mouth 3 (three) times daily.  . isosorbide dinitrate (ISORDIL) 20 MG tablet Take 20 mg by mouth 3 (three) times daily.  . isosorbide-hydrALAZINE (BIDIL) 20-37.5 MG per tablet Take 1 tablet by mouth 3 (three) times daily.  Marland Kitchen lisinopril (PRINIVIL,ZESTRIL) 20 MG tablet Take 20 mg by mouth daily.  Marland Kitchen loratadine (CLARITIN) 10 MG tablet Take 10 mg by mouth daily.  . metFORMIN (GLUCOPHAGE) 500 MG tablet Take 1 tablet (500 mg total) by mouth 2 (two) times daily with a meal.  . Multiple Vitamin (MULTIVITAMIN) capsule Take 1 capsule by mouth daily.  . SURE COMFORT PEN NEEDLES 31G X 8 MM MISC USE AS DIRECTED WITH LEVEMIR AND NOVOLOG. UP TO FOUR TIMES DAILY.  . [DISCONTINUED] MiFEPRIStone (KORLYM) 300 MG TABS Take 300 mg by mouth.   No facility-administered encounter medications on file as of 12/24/2016.    ALLERGIES: No Known Allergies VACCINATION STATUS: Immunization History  Administered Date(s)  Administered  . Influenza,inj,Quad PF,6+ Mos 02/02/2013    Diabetes  She presents for her follow-up diabetic visit. She has type 2 diabetes mellitus. Onset time: she was diagnosed at approximate age of 84 years. Her disease course has been worsening. There are no hypoglycemic associated symptoms. Pertinent negatives for hypoglycemia include no confusion, headaches, pallor or seizures. Associated symptoms include polydipsia and polyuria. Pertinent negatives for diabetes include no blurred vision, no chest pain, no fatigue and no polyphagia. There are no hypoglycemic complications. Symptoms are worsening. Diabetic complications include nephropathy. Risk factors for coronary artery disease  include diabetes mellitus, dyslipidemia, hypertension, obesity and sedentary lifestyle. Current diabetic treatment includes intensive insulin program. Her weight is stable (No success on weight loss.). She is following a generally unhealthy diet. She has had a previous visit with a dietitian. She never participates in exercise. Her breakfast blood glucose range is generally >200 mg/dl. Her lunch blood glucose range is generally >200 mg/dl. Her dinner blood glucose range is generally >200 mg/dl. Her overall blood glucose range is >200 mg/dl. An ACE inhibitor/angiotensin II receptor blocker is being taken.  Hypertension  This is a chronic problem. The current episode started more than 1 year ago. The problem is controlled. Pertinent negatives include no blurred vision, chest pain, headaches, palpitations or shortness of breath. Risk factors for coronary artery disease include diabetes mellitus, dyslipidemia, obesity and sedentary lifestyle. Past treatments include ACE inhibitors.    Review of Systems  Constitutional: Positive for unexpected weight change. Negative for chills, fatigue and fever.  HENT: Negative for trouble swallowing and voice change.   Eyes: Negative for blurred vision and visual disturbance.  Respiratory: Negative for cough, shortness of breath and wheezing.   Cardiovascular: Negative for chest pain, palpitations and leg swelling.  Gastrointestinal: Negative for diarrhea, nausea and vomiting.  Endocrine: Positive for polydipsia and polyuria. Negative for cold intolerance, heat intolerance and polyphagia.  Musculoskeletal: Negative for arthralgias and myalgias.  Skin: Negative for color change, pallor, rash and wound.  Neurological: Negative for seizures and headaches.  Psychiatric/Behavioral: Negative for confusion and suicidal ideas.    Objective:    There were no vitals taken for this visit.  Wt Readings from Last 3 Encounters:  11/09/16 250 lb (113.4 kg)  07/29/16 242 lb  (109.8 kg)  04/27/16 240 lb (108.9 kg)    Physical Exam  Constitutional: She is oriented to person, place, and time. She appears well-developed.  HENT:  Head: Normocephalic and atraumatic.  Eyes: EOM are normal.  Neck: Normal range of motion. Neck supple. No tracheal deviation present. No thyromegaly present.  Cardiovascular: Normal rate and regular rhythm.   Pulmonary/Chest: Effort normal and breath sounds normal.  Abdominal: Soft. Bowel sounds are normal. There is no tenderness. There is no guarding.  Musculoskeletal: Normal range of motion. She exhibits no edema.  Neurological: She is alert and oriented to person, place, and time. She has normal reflexes. No cranial nerve deficit. Coordination normal.  Skin: Skin is warm and dry. No rash noted. No erythema. No pallor.  Psychiatric: She has a normal mood and affect. Judgment normal.    Results for orders placed or performed in visit on 11/23/16  Comprehensive metabolic panel  Result Value Ref Range   Sodium 136 135 - 146 mmol/L   Potassium 4.4 3.5 - 5.3 mmol/L   Chloride 98 98 - 110 mmol/L   CO2 29 20 - 32 mmol/L   Glucose, Bld 288 (H) 65 - 99 mg/dL   BUN 36 (  H) 7 - 25 mg/dL   Creat 1.35 (H) 0.50 - 0.99 mg/dL   Total Bilirubin 0.3 0.2 - 1.2 mg/dL   Alkaline Phosphatase 63 33 - 130 U/L   AST 18 10 - 35 U/L   ALT 19 6 - 29 U/L   Total Protein 6.9 6.1 - 8.1 g/dL   Albumin 4.0 3.6 - 5.1 g/dL   Calcium 9.2 8.6 - 10.4 mg/dL   Complete Blood Count (Most recent): Lab Results  Component Value Date   WBC 8.4 02/02/2013   HGB 11.0 (L) 02/02/2013   HCT 32.7 (L) 02/02/2013   MCV 89.1 02/02/2013   PLT 254 02/02/2013   Chemistry (most recent): Lab Results  Component Value Date   NA 136 11/23/2016   K 4.4 11/23/2016   CL 98 11/23/2016   CO2 29 11/23/2016   BUN 36 (H) 11/23/2016   CREATININE 1.35 (H) 11/23/2016   Diabetic Labs (most recent): Lab Results  Component Value Date   HGBA1C 8.7 (H) 07/23/2016   HGBA1C 8.2 (H)  04/02/2016   HGBA1C 8.8 (H) 01/01/2016     Assessment & Plan:   1. Type 2 diabetes mellitus with stage 2 chronic kidney disease, with long-term current use of insulin (HCC)  -Her diabetes is  complicated by CKD and patient remains at a high risk for more acute and chronic complications of diabetes which include CAD, CVA, CKD, retinopathy, and neuropathy. These are all discussed in detail with the patient.  Patient came with persistently elevated glucose profile despite large dose of insulin and despite recent initiation of therapy with Korlym. - She admits to dietary indiscretion including consumption of large concepts of soda and juice.  - Her  recent A1c  is 8.7% increasing from 8.2  %,  After overall improved  from 14.1%. -  Glucose logs and insulin administration records pertaining to this visit,  to be scanned into patient's records.    - I have re-counseled the patient on diet management and weight loss  by adopting a carbohydrate restricted / protein rich  Diet.  Suggestion is made for her to avoid simple carbohydrates  from her diet including Cakes, Sweet Desserts, Ice Cream, Soda (diet and regular), Sweet Tea, Candies, Chips, Cookies, Store Bought Juices, Alcohol in Excess of  1-2 drinks a day, Artificial Sweeteners, and "Sugar-free" Products. This will help patient to have stable blood glucose profile and potentially avoid unintended weight gain.   - Patient is advised to stick to a routine mealtimes to eat 3 meals  a day and avoid unnecessary snacks ( to snack only to correct hypoglycemia).  - I have approached patient with the following individualized plan to manage diabetes and patient agrees. -there   Is a a big concern that she may not be taking her insulin for fear of hypoglycemia, no needle marks on her skin.  - She still has serious dietary indiscretion consumes a lot of processed carbs.  - Humulin RU500 is still a better option of insulin for her.  - I will increase  her dose of  U500   to 80 units  with breakfast, 80 units with lunch, and 80 units before supper for pre-meal blood glucose above 90 associated with monitoring of blood glucose before meals and at bedtime. - She is advised to skip insulin if pre-meal blood glucose is below 90 mg/dL. -Continue Metformin 500mg  by mouth twice a day.  Phineas Semen did not make any difference in her glucose profile, EKG for  the last 7 days is 245, increasing from 229 for the last 14 days. She gained 10 pounds since last visit.  - I advised her to discontinue Korlym after she finishes her current supplies.  - She did not have her potassium checked after initiation of Korlym, she will be sent to lab today.   - Patient specific target  for A1c; LDL, HDL, Triglycerides, and  Waist Circumference were discussed in detail.  2) BP/HTN: controlled. Continue current medications including ACEI/ARB. 3) Lipids/HPL:  continue statins. 4)  Weight/Diet: CDE consult in progress, exercise, and carbohydrates information provided.  5) Chronic Care/Health Maintenance:  -Patient is  on ACEI and encouraged to continue to follow up with Ophthalmology, Podiatrist at least yearly or according to recommendations, and advised to stay away from smoking. I have recommended yearly flu vaccine and pneumonia vaccination at least every 5 years;  and  sleep for at least 7 hours a day. -she has serious non compliance to dietary recomendations.     - Time spent with the patient: 25 min, of which >50% was spent in reviewing her sugar logs , discussing her hypo- and hyper-glycemic episodes, reviewing her current and  previous labs and insulin doses and developing a plan to avoid hypo- and hyper-glycemia.    - I advised patient to maintain close follow up with Rosita Fire, MD for primary care needs..   Follow up plan: -Return in about 3 months (around 03/25/2017) for meter, and logs.  Glade Lloyd, MD Phone: [phone]  Fax: [phone]  This  note was partially dictated with voice recognition software. Similar sounding words can be transcribed inadequately or may not  be corrected upon review.  12/24/2016, 1:57 PM

## 2016-12-25 ENCOUNTER — Other Ambulatory Visit: Payer: Self-pay | Admitting: "Endocrinology

## 2016-12-25 LAB — COMPLETE METABOLIC PANEL WITH GFR
AG Ratio: 1.5 (calc) (ref 1.0–2.5)
ALT: 13 U/L (ref 6–29)
AST: 19 U/L (ref 10–35)
Albumin: 4.1 g/dL (ref 3.6–5.1)
Alkaline phosphatase (APISO): 40 U/L (ref 33–130)
BUN/Creatinine Ratio: 23 (calc) — ABNORMAL HIGH (ref 6–22)
BUN: 38 mg/dL — ABNORMAL HIGH (ref 7–25)
CO2: 23 mmol/L (ref 20–32)
Calcium: 9.6 mg/dL (ref 8.6–10.4)
Chloride: 101 mmol/L (ref 98–110)
Creat: 1.67 mg/dL — ABNORMAL HIGH (ref 0.50–0.99)
GFR, Est African American: 38 mL/min/{1.73_m2} — ABNORMAL LOW (ref >=60)
GFR, Est Non African American: 33 mL/min/{1.73_m2} — ABNORMAL LOW (ref >=60)
Globulin: 2.8 g/dL (calc) (ref 1.9–3.7)
Glucose, Bld: 169 mg/dL — ABNORMAL HIGH (ref 65–139)
Potassium: 5 mmol/L (ref 3.5–5.3)
Sodium: 136 mmol/L (ref 135–146)
Total Bilirubin: 0.4 mg/dL (ref 0.2–1.2)
Total Protein: 6.9 g/dL (ref 6.1–8.1)

## 2016-12-29 ENCOUNTER — Other Ambulatory Visit: Payer: Self-pay | Admitting: "Endocrinology

## 2017-01-07 ENCOUNTER — Ambulatory Visit (HOSPITAL_COMMUNITY)
Admission: RE | Admit: 2017-01-07 | Discharge: 2017-01-07 | Disposition: A | Discharge status: Home / Self Care | Payer: Medicare Other | Source: Ambulatory Visit | Attending: Internal Medicine | Admitting: Internal Medicine

## 2017-01-07 ENCOUNTER — Other Ambulatory Visit (HOSPITAL_COMMUNITY): Payer: Self-pay | Admitting: Internal Medicine

## 2017-01-07 DIAGNOSIS — M170 Bilateral primary osteoarthritis of knee: Secondary | ICD-10-CM | POA: Insufficient documentation

## 2017-01-07 DIAGNOSIS — M25461 Effusion, right knee: Secondary | ICD-10-CM | POA: Diagnosis not present

## 2017-01-07 DIAGNOSIS — M25562 Pain in left knee: Secondary | ICD-10-CM

## 2017-01-07 DIAGNOSIS — M25561 Pain in right knee: Secondary | ICD-10-CM

## 2017-01-07 DIAGNOSIS — M25762 Osteophyte, left knee: Secondary | ICD-10-CM | POA: Diagnosis not present

## 2017-01-21 ENCOUNTER — Encounter: Payer: Self-pay | Admitting: Orthopaedic Surgery

## 2017-01-21 ENCOUNTER — Ambulatory Visit (INDEPENDENT_AMBULATORY_CARE_PROVIDER_SITE_OTHER): Payer: Medicare Other | Admitting: Orthopaedic Surgery

## 2017-01-21 VITALS — BP 149/85 | HR 89 | Temp 97.4°F

## 2017-01-21 DIAGNOSIS — Z794 Long term (current) use of insulin: Secondary | ICD-10-CM | POA: Diagnosis not present

## 2017-01-21 DIAGNOSIS — E1122 Type 2 diabetes mellitus with diabetic chronic kidney disease: Secondary | ICD-10-CM | POA: Diagnosis not present

## 2017-01-21 DIAGNOSIS — I10 Essential (primary) hypertension: Secondary | ICD-10-CM

## 2017-01-21 DIAGNOSIS — G8929 Other chronic pain: Secondary | ICD-10-CM | POA: Diagnosis not present

## 2017-01-21 DIAGNOSIS — N182 Chronic kidney disease, stage 2 (mild): Secondary | ICD-10-CM | POA: Diagnosis not present

## 2017-01-21 DIAGNOSIS — M25562 Pain in left knee: Secondary | ICD-10-CM

## 2017-01-21 DIAGNOSIS — F79 Unspecified intellectual disabilities: Secondary | ICD-10-CM | POA: Diagnosis not present

## 2017-01-21 DIAGNOSIS — M25561 Pain in right knee: Secondary | ICD-10-CM

## 2017-01-21 MED ORDER — NAPROXEN 500 MG PO TABS: 500.0000 mg | ORAL_TABLET | Freq: Two times a day (BID) | ORAL | 5 refills | Status: DC

## 2017-01-21 NOTE — Progress Notes (Signed)
Subjective:    Patient ID: [name], female    DOB: 03-12-1956, 60 y.o.   MRN: [id number]  HPI She has bilateral knee pain and has had it for years.  She has swelling and popping but no giving way, no redness, no trauma. She has tried Tylenol and heat with little help.  She does not walk much.  She is in an assistive living home. She has history of heart failure but is not on any anti-coagulant.  She refuses to have injections to the knees. She had x-rays of the knee recently and I have reviewed them.  She would like something to help relieve her knee pain.   Review of Systems  HENT: Negative for congestion.   Respiratory: Positive for shortness of breath. Negative for cough.   Cardiovascular: Positive for leg swelling. Negative for chest pain.  Endocrine: Positive for cold intolerance.  Musculoskeletal: Positive for arthralgias, back pain, gait problem and joint swelling.  Allergic/Immunologic: Positive for environmental allergies.   Past Medical History:  Diagnosis Date  . Arthritis   . Asthma   . CHF (congestive heart failure) ([place])   . Diabetes mellitus without complication ([place])   . Hypertension   . Iron deficiency anemia 10/16/2010  . Mental handicap 10/16/2010    Past Surgical History:  Procedure Laterality Date  . ABDOMINAL HYSTERECTOMY    . CESAREAN SECTION    . CHOLECYSTECTOMY    . COLONOSCOPY  08/2010   normal TI, sigmoid polyp (adenoma ). Next TCS due  08/2015,  . ESOPHAGOGASTRODUODENOSCOPY  08/2010   antral and duodenal erosions s/p bx (chronic gastritis, no h.pylori, no celiac dz ), hiatal hernia  . KNEE SURGERY     right knee @ 61 years of age  . LEFT AND RIGHT HEART CATHETERIZATION WITH CORONARY ANGIOGRAM N/A 09/21/2011   Procedure: LEFT AND RIGHT HEART CATHETERIZATION WITH CORONARY ANGIOGRAM;  Surgeon: Birdie Riddle, MD;  Location: [facility];  Service: Cardiovascular;  Laterality: N/A;    Current Outpatient Prescriptions on File Prior to Visit    Medication Sig Dispense Refill  . ACCU-CHEK AVIVA PLUS test strip USE TO TEST FOUR TIMES DAILY. 150 each 0  . ACCU-CHEK SOFTCLIX LANCETS lancets USE TO TEST FOUR TIMES DAILY. 150 each 5  . aspirin 325 MG tablet Take 325 mg by mouth daily.    . budesonide-formoterol (SYMBICORT) 160-4.5 MCG/ACT inhaler Inhale 2 puffs into the lungs 2 (two) times daily.    . Choline Fenofibrate (FENOFIBRIC ACID) 135 MG CPDR Take 1 capsule by mouth daily.    . furosemide (LASIX) 40 MG tablet Take 40 mg by mouth daily.    Marland Kitchen HUMULIN R U-500 KWIKPEN 500 UNIT/ML kwikpen INJECT 70 UNITS INTO THE SKIN AT BREAKFAST AND LUNCH, AND 80 UNITS WITH SUPPER WHEN BLOOD SUGAR >90. 6 mL 0  . hydrALAZINE (APRESOLINE) 25 MG tablet Take 25 mg by mouth 3 (three) times daily.    . isosorbide dinitrate (ISORDIL) 20 MG tablet Take 20 mg by mouth 3 (three) times daily.    . isosorbide-hydrALAZINE (BIDIL) 20-37.5 MG per tablet Take 1 tablet by mouth 3 (three) times daily.    Marland Kitchen lisinopril (PRINIVIL,ZESTRIL) 20 MG tablet Take 20 mg by mouth daily.    Marland Kitchen loratadine (CLARITIN) 10 MG tablet Take 10 mg by mouth daily.    . metFORMIN (GLUCOPHAGE) 500 MG tablet Take 1 tablet (500 mg total) by mouth 2 (two) times daily with a meal. 60 tablet 2  .  Multiple Vitamin (MULTIVITAMIN) capsule Take 1 capsule by mouth daily.    . SURE COMFORT PEN NEEDLES 31G X 8 MM MISC USE AS DIRECTED WITH LEVEMIR AND NOVOLOG. UP TO FOUR TIMES DAILY. 100 each 5   No current facility-administered medications on file prior to visit.     Social History   Social History  . Marital status: Divorced    Spouse name: N/A  . Number of children: 1  . Years of education: N/A   Occupational History  .  Unemployed   Social History Main Topics  . Smoking status: Former Smoker    Packs/day: 1.00    Years: 0.00    Types: Cigarettes    Quit date: 04/11/1996  . Smokeless tobacco: Never Used     Comment: quit a couple year ago  . Alcohol use No  . Drug use: No  . Sexual  activity: No   Other Topics Concern  . Not on file   Social History Narrative   Lives with parents.     Family History  Problem Relation Age of Onset  . Diabetes Mother   . Hypertension Mother   . Hypertension Father   . Kidney disease Unknown        son reportedly has had cyst on kidney and lung s/p surgery at [facility]  . Hypertension Son   . Colon cancer Neg Hx     BP (!) 149/85   Pulse 89   Temp (!) 97.4 F (36.3 C)      Objective:   Physical Exam  Constitutional: She is oriented to person, place, and time. She appears well-developed and well-nourished.  HENT:  Head: Normocephalic and atraumatic.  Eyes: Pupils are equal, round, and reactive to light. Conjunctivae and EOM are normal.  Neck: Normal range of motion. Neck supple.  Cardiovascular: Normal rate, regular rhythm and intact distal pulses.   Pulmonary/Chest: Effort normal.  Abdominal: Soft.  Musculoskeletal: She exhibits tenderness (Both knees tender, ROM right 0 to 105, left 0 to 110, crepitus both, both stalble with medial joint line tenderness, limp right, NV intact.).  Neurological: She is alert and oriented to person, place, and time. She displays normal reflexes. No cranial nerve deficit. She exhibits normal muscle tone. Coordination normal.  Skin: Skin is warm and dry.  Psychiatric: She has a normal mood and affect. Her behavior is normal. Judgment and thought content normal.  Vitals reviewed.         Assessment & Plan:   Encounter Diagnoses  Name Primary?  . Chronic pain of left knee Yes  . Chronic pain of right knee   . Essential hypertension, benign   . Type 2 diabetes mellitus with stage 2 chronic kidney disease, with long-term current use of insulin ([place])   . Mental handicap    I will begin Naprosyn 500 po bid pc. Return in one month.  Call if any problem.  Precautions discussed.   Electronically Signed Sanjuana Kava, MD 10/25/20181:55 PM

## 2017-01-25 ENCOUNTER — Other Ambulatory Visit: Payer: Self-pay

## 2017-01-25 MED ORDER — ACCU-CHEK AVIVA DEVI: 0 refills | Status: AC

## 2017-02-12 ENCOUNTER — Other Ambulatory Visit: Payer: Self-pay | Admitting: "Endocrinology

## 2017-02-20 ENCOUNTER — Other Ambulatory Visit: Payer: Self-pay | Admitting: "Endocrinology

## 2017-02-24 ENCOUNTER — Ambulatory Visit: Payer: Medicare Other | Admitting: Orthopaedic Surgery

## 2017-02-24 ENCOUNTER — Encounter: Payer: Self-pay | Admitting: Orthopaedic Surgery

## 2017-02-24 VITALS — BP 151/79 | HR 116 | Temp 96.5°F | Ht 64.5 in | Wt 253.0 lb

## 2017-02-24 DIAGNOSIS — E1122 Type 2 diabetes mellitus with diabetic chronic kidney disease: Secondary | ICD-10-CM | POA: Diagnosis not present

## 2017-02-24 DIAGNOSIS — M25562 Pain in left knee: Secondary | ICD-10-CM

## 2017-02-24 DIAGNOSIS — N182 Chronic kidney disease, stage 2 (mild): Secondary | ICD-10-CM | POA: Diagnosis not present

## 2017-02-24 DIAGNOSIS — I10 Essential (primary) hypertension: Secondary | ICD-10-CM

## 2017-02-24 DIAGNOSIS — G8929 Other chronic pain: Secondary | ICD-10-CM

## 2017-02-24 DIAGNOSIS — M25561 Pain in right knee: Secondary | ICD-10-CM | POA: Diagnosis not present

## 2017-02-24 DIAGNOSIS — Z794 Long term (current) use of insulin: Secondary | ICD-10-CM | POA: Diagnosis not present

## 2017-02-24 NOTE — Progress Notes (Signed)
Patient [name], female DOB:12-27-1955, 61 y.o. MEQ:[id number]  Chief Complaint  Patient presents with  . Follow-up    Bilateral Knee Pain    HPI  [name] is a 61 y.o. female who has bilateral knee pain, more on the left.  She is taking her medicine.  She has pain with changes in weather and more swelling of the left knee.  She has no giving way, no locking, no redness.  She uses a rub at times. HPI  Body mass index is 42.76 kg/m.  ROS  Review of Systems  HENT: Negative for congestion.   Respiratory: Positive for shortness of breath. Negative for cough.   Cardiovascular: Positive for leg swelling. Negative for chest pain.  Endocrine: Positive for cold intolerance.  Musculoskeletal: Positive for arthralgias, back pain, gait problem and joint swelling.  Allergic/Immunologic: Positive for environmental allergies.  All other systems reviewed and are negative.   Past Medical History:  Diagnosis Date  . Arthritis   . Asthma   . CHF (congestive heart failure) ([place])   . Diabetes mellitus without complication ([place])   . Hypertension   . Iron deficiency anemia 10/16/2010  . Mental handicap 10/16/2010    Past Surgical History:  Procedure Laterality Date  . ABDOMINAL HYSTERECTOMY    . CESAREAN SECTION    . CHOLECYSTECTOMY    . COLONOSCOPY  08/2010   normal TI, sigmoid polyp (adenoma ). Next TCS due  08/2015,  . ESOPHAGOGASTRODUODENOSCOPY  08/2010   antral and duodenal erosions s/p bx (chronic gastritis, no h.pylori, no celiac dz ), hiatal hernia  . KNEE SURGERY     right knee @ 61 years of age  . LEFT AND RIGHT HEART CATHETERIZATION WITH CORONARY ANGIOGRAM N/A 09/21/2011   Procedure: LEFT AND RIGHT HEART CATHETERIZATION WITH CORONARY ANGIOGRAM;  Surgeon: Birdie Riddle, MD;  Location: [facility] [facility];  Service: Cardiovascular;  Laterality: N/A;    Family History  Problem Relation Age of Onset  . Diabetes Mother   . Hypertension Mother   . Hypertension Father   .  Kidney disease Unknown        son reportedly has had cyst on kidney and lung s/p surgery at [facility]  . Hypertension Son   . Colon cancer Neg Hx     Social History Social History   Tobacco Use  . Smoking status: Former Smoker    Packs/day: 1.00    Years: 0.00    Pack years: 0.00    Types: Cigarettes    Last attempt to quit: 04/11/1996    Years since quitting: 20.8  . Smokeless tobacco: Never Used  . Tobacco comment: quit a couple year ago  Substance Use Topics  . Alcohol use: No  . Drug use: No    No Known Allergies  Current Outpatient Medications  Medication Sig Dispense Refill  . ACCU-CHEK AVIVA PLUS test strip USE TO TEST FOUR TIMES DAILY. 150 each 5  . ACCU-CHEK SOFTCLIX LANCETS lancets USE TO TEST FOUR TIMES DAILY. 150 each 5  . aspirin 325 MG tablet Take 325 mg by mouth daily.    . Blood Glucose Monitoring Suppl (ACCU-CHEK AVIVA) device Use as instructed qid. e11.65 1 each 0  . budesonide-formoterol (SYMBICORT) 160-4.5 MCG/ACT inhaler Inhale 2 puffs into the lungs 2 (two) times daily.    . Choline Fenofibrate (FENOFIBRIC ACID) 135 MG CPDR Take 1 capsule by mouth daily.    . furosemide (LASIX) 40 MG tablet Take 40 mg by mouth daily.    Marland Kitchen  HUMULIN R U-500 KWIKPEN 500 UNIT/ML kwikpen INJECT 70 UNITS INTO THE SKIN AT BREAKFAST AND LUNCH, AND 80 UNITS WITH SUPPER WHEN BLOOD SUGAR >90. 6 mL 0  . hydrALAZINE (APRESOLINE) 25 MG tablet Take 25 mg by mouth 3 (three) times daily.    . isosorbide dinitrate (ISORDIL) 20 MG tablet Take 20 mg by mouth 3 (three) times daily.    . isosorbide-hydrALAZINE (BIDIL) 20-37.5 MG per tablet Take 1 tablet by mouth 3 (three) times daily.    Marland Kitchen lisinopril (PRINIVIL,ZESTRIL) 20 MG tablet Take 20 mg by mouth daily.    Marland Kitchen loratadine (CLARITIN) 10 MG tablet Take 10 mg by mouth daily.    . metFORMIN (GLUCOPHAGE) 500 MG tablet Take 1 tablet (500 mg total) by mouth 2 (two) times daily with a meal. 60 tablet 2  . Multiple Vitamin (MULTIVITAMIN) capsule Take 1  capsule by mouth daily.    . naproxen (NAPROSYN) 500 MG tablet Take 1 tablet (500 mg total) by mouth 2 (two) times daily with a meal. 60 tablet 5  . SURE COMFORT PEN NEEDLES 31G X 8 MM MISC USE AS DIRECTED WITH LEVEMIR AND NOVOLOG. UP TO FOUR TIMES DAILY. 100 each 5   No current facility-administered medications for this visit.      Physical Exam  Blood pressure (!) 151/79, pulse (!) 116, temperature (!) 96.5 F (35.8 C), height 5' 4.5" (1.638 m), weight 253 lb (114.8 kg).  Constitutional: overall normal hygiene, normal nutrition, well developed, normal grooming, normal body habitus. Assistive device:none  Musculoskeletal: gait and station Limp left, muscle tone and strength are normal, no tremors or atrophy is present.  .  Neurological: coordination overall normal.  Deep tendon reflex/nerve stretch intact.  Sensation normal.  Cranial nerves II-XII intact.   Skin:   Normal overall no scars, lesions, ulcers or rashes. No psoriasis.  Psychiatric: Alert and oriented x 3.  Recent memory intact, remote memory unclear.  Normal mood and affect. Well groomed.  Good eye contact.  Cardiovascular: overall no swelling, no varicosities, no edema bilaterally, normal temperatures of the legs and arms, no clubbing, cyanosis and good capillary refill.  Lymphatic: palpation is normal.  All other systems reviewed and are negative   The left lower extremity is examined:  Inspection:  Thigh:  Non-tender and no defects  Knee has swelling 1+ effusion.                        Joint tenderness is present                        Patient is tender over the medial joint line  Lower Leg:  Has normal appearance and no tenderness or defects  Ankle:  Non-tender and no defects  Foot:  Non-tender and no defects Range of Motion:  Knee:  Range of motion is: 0-105                        Crepitus is  present  Ankle:  Range of motion is normal. Strength and Tone:  The left lower extremity has normal strength and  tone. Stability:  Knee:  The knee is stable.  Ankle:  The ankle is stable.   The patient has been educated about the nature of the problem(s) and counseled on treatment options.  The patient appeared to understand what I have discussed and is in agreement with it.  Encounter Diagnoses  Name  Primary?  . Chronic pain of left knee Yes  . Chronic pain of right knee   . Essential hypertension, benign   . Type 2 diabetes mellitus with stage 2 chronic kidney disease, with long-term current use of insulin ([place])     PLAN Call if any problems.  Precautions discussed.  Continue current medications.   Return to clinic 3 months   Electronically Signed Sanjuana Kava, MD 11/28/20189:36 AM

## 2017-03-27 ENCOUNTER — Other Ambulatory Visit: Payer: Self-pay | Admitting: "Endocrinology

## 2017-04-01 ENCOUNTER — Ambulatory Visit: Payer: Medicare Other | Admitting: "Endocrinology

## 2017-04-08 LAB — COMPLETE METABOLIC PANEL WITH GFR
AG Ratio: 1.2 (calc) (ref 1.0–2.5)
ALT: 18 U/L (ref 6–29)
AST: 19 U/L (ref 10–35)
Albumin: 3.6 g/dL (ref 3.6–5.1)
Alkaline phosphatase (APISO): 77 U/L (ref 33–130)
BUN/Creatinine Ratio: 44 (calc) — ABNORMAL HIGH (ref 6–22)
BUN: 64 mg/dL — ABNORMAL HIGH (ref 7–25)
CO2: 26 mmol/L (ref 20–32)
Calcium: 9.3 mg/dL (ref 8.6–10.4)
Chloride: 103 mmol/L (ref 98–110)
Creat: 1.44 mg/dL — ABNORMAL HIGH (ref 0.50–0.99)
GFR, Est African American: 45 mL/min/{1.73_m2} — ABNORMAL LOW (ref >=60)
GFR, Est Non African American: 39 mL/min/{1.73_m2} — ABNORMAL LOW (ref >=60)
Globulin: 2.9 g/dL (calc) (ref 1.9–3.7)
Glucose, Bld: 200 mg/dL — ABNORMAL HIGH (ref 65–99)
Potassium: 5.2 mmol/L (ref 3.5–5.3)
Sodium: 137 mmol/L (ref 135–146)
Total Bilirubin: 0.2 mg/dL (ref 0.2–1.2)
Total Protein: 6.5 g/dL (ref 6.1–8.1)

## 2017-04-08 LAB — HEMOGLOBIN A1C
Hgb A1c MFr Bld: 9.3 % of total Hgb — ABNORMAL HIGH (ref <5.7)
Mean Plasma Glucose: 220 (calc)
eAG (mmol/L): 12.2 (calc)

## 2017-04-08 LAB — T4, FREE: Free T4: 1.2 ng/dL (ref 0.8–1.8)

## 2017-04-08 LAB — LIPID PANEL
Cholesterol: 152 mg/dL (ref <200)
HDL: 38 mg/dL — ABNORMAL LOW (ref >50)
LDL Cholesterol (Calc): 80 mg/dL (calc)
Non-HDL Cholesterol (Calc): 114 mg/dL (calc) (ref <130)
Total CHOL/HDL Ratio: 4 (calc) (ref <5.0)
Triglycerides: 244 mg/dL — ABNORMAL HIGH (ref <150)

## 2017-04-08 LAB — TSH: TSH: 2.11 mIU/L (ref 0.40–4.50)

## 2017-04-14 ENCOUNTER — Encounter: Payer: Self-pay | Admitting: "Endocrinology

## 2017-04-14 ENCOUNTER — Ambulatory Visit (INDEPENDENT_AMBULATORY_CARE_PROVIDER_SITE_OTHER): Payer: Medicare Other | Admitting: "Endocrinology

## 2017-04-14 ENCOUNTER — Other Ambulatory Visit: Payer: Self-pay | Admitting: "Endocrinology

## 2017-04-14 VITALS — BP 145/80 | HR 91 | Ht 64.5 in | Wt 261.0 lb

## 2017-04-14 DIAGNOSIS — E1122 Type 2 diabetes mellitus with diabetic chronic kidney disease: Secondary | ICD-10-CM | POA: Diagnosis not present

## 2017-04-14 DIAGNOSIS — N182 Chronic kidney disease, stage 2 (mild): Secondary | ICD-10-CM

## 2017-04-14 DIAGNOSIS — Z794 Long term (current) use of insulin: Secondary | ICD-10-CM

## 2017-04-14 DIAGNOSIS — I10 Essential (primary) hypertension: Secondary | ICD-10-CM | POA: Diagnosis not present

## 2017-04-14 MED ORDER — INSULIN REGULAR HUMAN (CONC) 500 UNIT/ML ~~LOC~~ SOPN: PEN_INJECTOR | SUBCUTANEOUS | 2 refills | Status: DC

## 2017-04-14 MED ORDER — METFORMIN HCL 500 MG PO TABS: 500.0000 mg | ORAL_TABLET | Freq: Two times a day (BID) | ORAL | 2 refills | Status: DC

## 2017-04-14 NOTE — Progress Notes (Signed)
Subjective:    Patient ID: [name], female    DOB: 23-Apr-1955, PCP Rosita Fire, MD   Past Medical History:  Diagnosis Date  . Arthritis   . Asthma   . CHF (congestive heart failure) ([place])   . Diabetes mellitus without complication ([place])   . Hypertension   . Iron deficiency anemia 10/16/2010  . Mental handicap 10/16/2010   Past Surgical History:  Procedure Laterality Date  . ABDOMINAL HYSTERECTOMY    . CESAREAN SECTION    . CHOLECYSTECTOMY    . COLONOSCOPY  08/2010   normal TI, sigmoid polyp (adenoma ). Next TCS due  08/2015,  . ESOPHAGOGASTRODUODENOSCOPY  08/2010   antral and duodenal erosions s/p bx (chronic gastritis, no h.pylori, no celiac dz ), hiatal hernia  . KNEE SURGERY     right knee @ 62 years of age  . LEFT AND RIGHT HEART CATHETERIZATION WITH CORONARY ANGIOGRAM N/A 09/21/2011   Procedure: LEFT AND RIGHT HEART CATHETERIZATION WITH CORONARY ANGIOGRAM;  Surgeon: Birdie Riddle, MD;  Location: [facility] CATH [facility];  Service: Cardiovascular;  Laterality: N/A;   Social History   Socioeconomic History  . Marital status: Divorced    Spouse name: None  . Number of children: 1  . Years of education: None  . Highest education level: None  Social Needs  . Financial resource strain: None  . Food insecurity - worry: None  . Food insecurity - inability: None  . Transportation needs - medical: None  . Transportation needs - non-medical: None  Occupational History    Employer: UNEMPLOYED  Tobacco Use  . Smoking status: Former Smoker    Packs/day: 1.00    Years: 0.00    Pack years: 0.00    Types: Cigarettes    Last attempt to quit: 04/11/1996    Years since quitting: 21.0  . Smokeless tobacco: Never Used  . Tobacco comment: quit a couple year ago  Substance and Sexual Activity  . Alcohol use: No  . Drug use: No  . Sexual activity: No  Other Topics Concern  . None  Social History Narrative   Lives with parents.    Outpatient Encounter Medications as of 04/14/2017   Medication Sig  . ACCU-CHEK AVIVA PLUS test strip USE TO TEST FOUR TIMES DAILY.  Marland Kitchen ACCU-CHEK SOFTCLIX LANCETS lancets USE TO TEST FOUR TIMES DAILY.  Marland Kitchen aspirin 325 MG tablet Take 325 mg by mouth daily.  . Blood Glucose Monitoring Suppl (ACCU-CHEK AVIVA) device Use as instructed qid. e11.65  . budesonide-formoterol (SYMBICORT) 160-4.5 MCG/ACT inhaler Inhale 2 puffs into the lungs 2 (two) times daily.  . Choline Fenofibrate (FENOFIBRIC ACID) 135 MG CPDR Take 1 capsule by mouth daily.  . furosemide (LASIX) 40 MG tablet Take 40 mg by mouth daily.  . hydrALAZINE (APRESOLINE) 25 MG tablet Take 25 mg by mouth 3 (three) times daily.  . insulin regular human CONCENTRATED (HUMULIN R U-500 KWIKPEN) 500 UNIT/ML kwikpen Inject 90 units with breakfast, 90 units with lunch, and 80 units with supper when blood glucose before food is greater than 90 mg/dL  . isosorbide dinitrate (ISORDIL) 20 MG tablet Take 20 mg by mouth 3 (three) times daily.  . isosorbide-hydrALAZINE (BIDIL) 20-37.5 MG per tablet Take 1 tablet by mouth 3 (three) times daily.  Marland Kitchen lisinopril (PRINIVIL,ZESTRIL) 20 MG tablet Take 20 mg by mouth daily.  Marland Kitchen loratadine (CLARITIN) 10 MG tablet Take 10 mg by mouth daily.  . metFORMIN (GLUCOPHAGE) 500 MG tablet Take 1 tablet (500  mg total) by mouth 2 (two) times daily with a meal.  . Multiple Vitamin (MULTIVITAMIN) capsule Take 1 capsule by mouth daily.  . naproxen (NAPROSYN) 500 MG tablet Take 1 tablet (500 mg total) by mouth 2 (two) times daily with a meal.  . SURE COMFORT PEN NEEDLES 31G X 8 MM MISC USE AS DIRECTED WITH LEVEMIR AND NOVOLOG. UP TO FOUR TIMES DAILY.  . [DISCONTINUED] HUMULIN R U-500 KWIKPEN 500 UNIT/ML kwikpen INJECT 80 UNITS INTO THE SKIN AT BREAKFAST, LUNCH, AND SUPPER.  . [DISCONTINUED] metFORMIN (GLUCOPHAGE) 500 MG tablet Take 1 tablet (500 mg total) by mouth 2 (two) times daily with a meal.   No facility-administered encounter medications on file as of 04/14/2017.     ALLERGIES: No Known Allergies VACCINATION STATUS: Immunization History  Administered Date(s) Administered  . Influenza,inj,Quad PF,6+ Mos 02/02/2013    Diabetes  She presents for her follow-up diabetic visit. She has type 2 diabetes mellitus. Onset time: she was diagnosed at approximate age of 62 years. Her disease course has been worsening. There are no hypoglycemic associated symptoms. Pertinent negatives for hypoglycemia include no confusion, headaches, pallor or seizures. Associated symptoms include polydipsia and polyuria. Pertinent negatives for diabetes include no blurred vision, no chest pain, no fatigue and no polyphagia. There are no hypoglycemic complications. Symptoms are worsening. Diabetic complications include nephropathy. Risk factors for coronary artery disease include diabetes mellitus, dyslipidemia, hypertension, obesity and sedentary lifestyle. Current diabetic treatment includes intensive insulin program. Her weight is stable (No success on weight loss.). She is following a generally unhealthy diet. She has had a previous visit with a dietitian. She never participates in exercise. Her home blood glucose trend is fluctuating minimally. Her breakfast blood glucose range is generally >200 mg/dl. Her lunch blood glucose range is generally >200 mg/dl. Her dinner blood glucose range is generally >200 mg/dl. Her overall blood glucose range is >200 mg/dl. (She has not been consistent in using her insulin, did not cover a third of her meals with insulin for fear of hypoglycemia. ) An ACE inhibitor/angiotensin II receptor blocker is being taken.  Hypertension  This is a chronic problem. The current episode started more than 1 year ago. The problem is controlled. Pertinent negatives include no blurred vision, chest pain, headaches, palpitations or shortness of breath. Risk factors for coronary artery disease include diabetes mellitus, dyslipidemia, obesity and sedentary lifestyle. Past  treatments include ACE inhibitors.    Review of Systems  Constitutional: Positive for unexpected weight change. Negative for chills, fatigue and fever.  HENT: Negative for trouble swallowing and voice change.   Eyes: Negative for blurred vision and visual disturbance.  Respiratory: Negative for cough, shortness of breath and wheezing.   Cardiovascular: Negative for chest pain, palpitations and leg swelling.  Gastrointestinal: Negative for diarrhea, nausea and vomiting.  Endocrine: Positive for polydipsia and polyuria. Negative for cold intolerance, heat intolerance and polyphagia.  Musculoskeletal: Negative for arthralgias and myalgias.  Skin: Negative for color change, pallor, rash and wound.  Neurological: Negative for seizures and headaches.  Psychiatric/Behavioral: Negative for confusion and suicidal ideas.    Objective:    BP (!) 145/80   Pulse 91   Ht 5' 4.5" (1.638 m)   Wt 261 lb (118.4 kg)   BMI 44.11 kg/m   Wt Readings from Last 3 Encounters:  04/14/17 261 lb (118.4 kg)  02/24/17 253 lb (114.8 kg)  11/09/16 250 lb (113.4 kg)    Physical Exam  Constitutional: She is oriented to person, place,  and time. She appears well-developed.  HENT:  Head: Normocephalic and atraumatic.  Eyes: EOM are normal.  Neck: Normal range of motion. Neck supple. No tracheal deviation present. No thyromegaly present.  Cardiovascular: Normal rate and regular rhythm.  Pulmonary/Chest: Effort normal and breath sounds normal.  Abdominal: Soft. Bowel sounds are normal. There is no tenderness. There is no guarding.  Musculoskeletal: Normal range of motion. She exhibits no edema.  Neurological: She is alert and oriented to person, place, and time. She has normal reflexes. No cranial nerve deficit. Coordination normal.  Skin: Skin is warm and dry. No rash noted. No erythema. No pallor.  Psychiatric: She has a normal mood and affect. Judgment normal.    Results for orders placed or performed in  visit on 12/24/16  COMPLETE METABOLIC PANEL WITH GFR  Result Value Ref Range   Glucose, Bld 200 (H) 65 - 99 mg/dL   BUN 64 (H) 7 - 25 mg/dL   Creat 1.44 (H) 0.50 - 0.99 mg/dL   GFR, Est Non African American 39 (L) > OR = 60 mL/min/1.64m2   GFR, Est African American 45 (L) > OR = 60 mL/min/1.51m2   BUN/Creatinine Ratio 44 (H) 6 - 22 (calc)   Sodium 137 135 - 146 mmol/L   Potassium 5.2 3.5 - 5.3 mmol/L   Chloride 103 98 - 110 mmol/L   CO2 26 20 - 32 mmol/L   Calcium 9.3 8.6 - 10.4 mg/dL   Total Protein 6.5 6.1 - 8.1 g/dL   Albumin 3.6 3.6 - 5.1 g/dL   Globulin 2.9 1.9 - 3.7 g/dL (calc)   AG Ratio 1.2 1.0 - 2.5 (calc)   Total Bilirubin 0.2 0.2 - 1.2 mg/dL   Alkaline phosphatase (APISO) 77 33 - 130 U/L   AST 19 10 - 35 U/L   ALT 18 6 - 29 U/L  Hemoglobin A1c  Result Value Ref Range   Hgb A1c MFr Bld 9.3 (H) <5.7 % of total Hgb   Mean Plasma Glucose 220 (calc)   eAG (mmol/L) 12.2 (calc)  TSH  Result Value Ref Range   TSH 2.11 0.40 - 4.50 mIU/L  T4, free  Result Value Ref Range   Free T4 1.2 0.8 - 1.8 ng/dL  Lipid panel  Result Value Ref Range   Cholesterol 152 <200 mg/dL   HDL 38 (L) >50 mg/dL   Triglycerides 244 (H) <150 mg/dL   LDL Cholesterol (Calc) 80 mg/dL (calc)   Total CHOL/HDL Ratio 4.0 <5.0 (calc)   Non-HDL Cholesterol (Calc) 114 <130 mg/dL (calc)  COMPLETE METABOLIC PANEL WITH GFR  Result Value Ref Range   Glucose, Bld 169 (H) 65 - 139 mg/dL   BUN 38 (H) 7 - 25 mg/dL   Creat 1.67 (H) 0.50 - 0.99 mg/dL   GFR, Est Non African American 33 (L) > OR = 60 mL/min/1.1m2   GFR, Est African American 38 (L) > OR = 60 mL/min/1.55m2   BUN/Creatinine Ratio 23 (H) 6 - 22 (calc)   Sodium 136 135 - 146 mmol/L   Potassium 5.0 3.5 - 5.3 mmol/L   Chloride 101 98 - 110 mmol/L   CO2 23 20 - 32 mmol/L   Calcium 9.6 8.6 - 10.4 mg/dL   Total Protein 6.9 6.1 - 8.1 g/dL   Albumin 4.1 3.6 - 5.1 g/dL   Globulin 2.8 1.9 - 3.7 g/dL (calc)   AG Ratio 1.5 1.0 - 2.5 (calc)   Total  Bilirubin 0.4 0.2 - 1.2 mg/dL  Alkaline phosphatase (APISO) 40 33 - 130 U/L   AST 19 10 - 35 U/L   ALT 13 6 - 29 U/L   Complete Blood Count (Most recent): Lab Results  Component Value Date   WBC 8.4 02/02/2013   HGB 11.0 (L) 02/02/2013   HCT 32.7 (L) 02/02/2013   MCV 89.1 02/02/2013   PLT 254 02/02/2013   Chemistry (most recent): Lab Results  Component Value Date   NA 137 04/07/2017   K 5.2 04/07/2017   CL 103 04/07/2017   CO2 26 04/07/2017   BUN 64 (H) 04/07/2017   CREATININE 1.44 (H) 04/07/2017   Diabetic Labs (most recent): Lab Results  Component Value Date   HGBA1C 9.3 (H) 04/07/2017   HGBA1C 8.7 (H) 07/23/2016   HGBA1C 8.2 (H) 04/02/2016     Assessment & Plan:   1. Type 2 diabetes mellitus with stage 2 chronic kidney disease, with long-term current use of insulin ([facility])  -Her diabetes is  complicated by CKD and patient remains at a high risk for more acute and chronic complications of diabetes which include CAD, CVA, CKD, retinopathy, and neuropathy. These are all discussed in detail with the patient.  Patient came with persistently elevated glucose profile despite large dose of insulin. - She missed the third of her insulin opportunities due to fear of hypoglycemia. - She admits to dietary indiscretion including consumption of large quantities of soda and juice.  - Her  recent A1c  is   9.3%, slowly increasing from 8.2%.  After overall improved  from 14.1%. -  Glucose logs and insulin administration records pertaining to this visit,  to be scanned into patient's records.    - I have re-counseled the patient on diet management and weight loss  by adopting a carbohydrate restricted / protein rich  Diet.  -  Suggestion is made for her to avoid simple carbohydrates  from her diet including Cakes, Sweet Desserts / Pastries, Ice Cream, Soda (diet and regular), Sweet Tea, Candies, Chips, Cookies, Store Bought Juices, Alcohol in Excess of  1-2 drinks a day, Artificial  Sweeteners, and "Sugar-free" Products. This will help patient to have stable blood glucose profile and potentially avoid unintended weight gain. - She is accompanied by her Education officer, museum, however she wishes to lead an independent life at her house with minimal intervention by other people.  - Patient is advised to stick to a routine mealtimes to eat 3 meals  a day and avoid unnecessary snacks ( to snack only to correct hypoglycemia).  - I have approached patient with the following individualized plan to manage diabetes and patient agrees.  - She still has serious dietary indiscretion consumes a lot of processed carbs.  - Humulin RU500 is still a better option of insulin for her.  - I will increase her dose of  U500   to 90 units  with breakfast, 90 units with lunch, and 80 units before supper for pre-meal blood glucose above 90 associated with monitoring of blood glucose before meals and at bedtime. - She is advised to skip insulin if pre-meal blood glucose is below 90 mg/dL. -Continue Metformin 500mg  by mouth twice a day. - She is observed to struggle following this lately, get it insulin regimen. Her best option would be placement in group home for skilled care assistance.  - Patient specific target  for A1c; LDL, HDL, Triglycerides, and  Waist Circumference were discussed in detail.  2) BP/HTN: uncontrolled. Continue current medications including ACEI/ARB. 3)  Lipids/HPL:  continue statins. 4)  Weight/Diet: CDE consult in progress, exercise, and carbohydrates information provided.  5) Chronic Care/Health Maintenance:  -Patient is  on ACEI and encouraged to continue to follow up with Ophthalmology, Podiatrist at least yearly or according to recommendations, and advised to stay away from smoking. I have recommended yearly flu vaccine and pneumonia vaccination at least every 5 years;  and  sleep for at least 7 hours a day. -she has serious non compliance to dietary recomendations.     - Time  spent with the patient: 25 min, of which >50% was spent in reviewing her blood glucose logs , discussing her hypo- and hyper-glycemic episodes, reviewing her current and  previous labs and insulin doses and developing a plan to avoid hypo- and hyper-glycemia. Please refer to Patient Instructions for Blood Glucose Monitoring and Insulin/Medications Dosing Guide"  in media tab for additional information.  - I advised patient to maintain close follow up with Rosita Fire, MD for primary care needs..   Follow up plan: -No Follow-up on file.  Glade Lloyd, MD Phone: [phone]  Fax: [phone]  This note was partially dictated with voice recognition software. Similar sounding words can be transcribed inadequately or may not  be corrected upon review.  04/14/2017, 1:21 PM

## 2017-04-14 NOTE — Patient Instructions (Signed)

## 2017-05-26 ENCOUNTER — Encounter: Payer: Self-pay | Admitting: Orthopaedic Surgery

## 2017-05-26 ENCOUNTER — Ambulatory Visit: Payer: Medicare Other | Admitting: Orthopaedic Surgery

## 2017-05-26 VITALS — BP 151/87 | HR 99 | Temp 97.2°F | Ht 64.5 in | Wt 259.0 lb

## 2017-05-26 DIAGNOSIS — M25562 Pain in left knee: Secondary | ICD-10-CM

## 2017-05-26 DIAGNOSIS — G8929 Other chronic pain: Secondary | ICD-10-CM

## 2017-05-26 DIAGNOSIS — M25561 Pain in right knee: Secondary | ICD-10-CM | POA: Diagnosis not present

## 2017-05-26 NOTE — Progress Notes (Signed)
Patient ZD:[id number] A Flatley, female DOB:1955-11-05, 62 y.o. QQV:[id number]  Chief Complaint  Patient presents with  . Knee Pain    bilateral    HPI  [name] is a 62 y.o. female who has chronic bilateral knee pain. She has popping and swelling. She has no giving way.  She has no redness or new trauma.  She is taking her medicine and doing her exercises. The cold weather recently made her worse. HPI  Body mass index is 43.77 kg/m.  ROS  Review of Systems  HENT: Negative for congestion.   Respiratory: Positive for shortness of breath. Negative for cough.   Cardiovascular: Positive for leg swelling. Negative for chest pain.  Endocrine: Positive for cold intolerance.  Musculoskeletal: Positive for arthralgias, back pain, gait problem and joint swelling.  Allergic/Immunologic: Positive for environmental allergies.  All other systems reviewed and are negative.   Past Medical History:  Diagnosis Date  . Arthritis   . Asthma   . CHF (congestive heart failure) ([place])   . Diabetes mellitus without complication ([place])   . Hypertension   . Iron deficiency anemia 10/16/2010  . Mental handicap 10/16/2010    Past Surgical History:  Procedure Laterality Date  . ABDOMINAL HYSTERECTOMY    . CESAREAN SECTION    . CHOLECYSTECTOMY    . COLONOSCOPY  08/2010   normal TI, sigmoid polyp (adenoma ). Next TCS due  08/2015,  . ESOPHAGOGASTRODUODENOSCOPY  08/2010   antral and duodenal erosions s/p bx (chronic gastritis, no h.pylori, no celiac dz ), hiatal hernia  . KNEE SURGERY     right knee @ 62 years of age  . LEFT AND RIGHT HEART CATHETERIZATION WITH CORONARY ANGIOGRAM N/A 09/21/2011   Procedure: LEFT AND RIGHT HEART CATHETERIZATION WITH CORONARY ANGIOGRAM;  Surgeon: Birdie Riddle, MD;  Location: [facility];  Service: Cardiovascular;  Laterality: N/A;    Family History  Problem Relation Age of Onset  . Diabetes Mother   . Hypertension Mother   . Hypertension Father   . Kidney disease  Unknown        son reportedly has had cyst on kidney and lung s/p surgery [facility]  . Hypertension Son   . Colon cancer Neg Hx     Social History Social History   Tobacco Use  . Smoking status: Former Smoker    Packs/day: 1.00    Years: 0.00    Pack years: 0.00    Types: Cigarettes    Last attempt to quit: 04/11/1996    Years since quitting: 21.1  . Smokeless tobacco: Never Used  . Tobacco comment: quit a couple year ago  Substance Use Topics  . Alcohol use: No  . Drug use: No    No Known Allergies  Current Outpatient Medications  Medication Sig Dispense Refill  . ACCU-CHEK AVIVA PLUS test strip USE TO TEST FOUR TIMES DAILY. 150 each 5  . ACCU-CHEK SOFTCLIX LANCETS lancets USE TO TEST FOUR TIMES DAILY. 150 each 5  . aspirin 325 MG tablet Take 325 mg by mouth daily.    . Blood Glucose Monitoring Suppl (ACCU-CHEK AVIVA) device Use as instructed qid. e11.65 1 each 0  . budesonide-formoterol (SYMBICORT) 160-4.5 MCG/ACT inhaler Inhale 2 puffs into the lungs 2 (two) times daily.    . Choline Fenofibrate (FENOFIBRIC ACID) 135 MG CPDR Take 1 capsule by mouth daily.    . furosemide (LASIX) 40 MG tablet Take 40 mg by mouth daily.    . hydrALAZINE (APRESOLINE) 25  MG tablet Take 25 mg by mouth 3 (three) times daily.    . insulin regular human CONCENTRATED (HUMULIN R U-500 KWIKPEN) 500 UNIT/ML kwikpen Inject 90 units with breakfast, 90 units with lunch, and 80 units with supper when blood glucose before food is greater than 90 mg/dL 12 mL 2  . isosorbide dinitrate (ISORDIL) 20 MG tablet Take 20 mg by mouth 3 (three) times daily.    . isosorbide-hydrALAZINE (BIDIL) 20-37.5 MG per tablet Take 1 tablet by mouth 3 (three) times daily.    Marland Kitchen lisinopril (PRINIVIL,ZESTRIL) 20 MG tablet Take 20 mg by mouth daily.    Marland Kitchen loratadine (CLARITIN) 10 MG tablet Take 10 mg by mouth daily.    . metFORMIN (GLUCOPHAGE) 500 MG tablet Take 1 tablet (500 mg total) by mouth 2 (two) times daily with a meal. 180  tablet 2  . Multiple Vitamin (MULTIVITAMIN) capsule Take 1 capsule by mouth daily.    . naproxen (NAPROSYN) 500 MG tablet Take 1 tablet (500 mg total) by mouth 2 (two) times daily with a meal. 60 tablet 5  . SURE COMFORT PEN NEEDLES 31G X 8 MM MISC USE AS DIRECTED WITH LEVEMIR AND NOVOLOG. UP TO FOUR TIMES DAILY. 100 each 5   No current facility-administered medications for this visit.      Physical Exam  Blood pressure (!) 151/87, pulse 99, temperature (!) 97.2 F (36.2 C), height 5' 4.5" (1.638 m), weight 259 lb (117.5 kg).  Constitutional: overall normal hygiene, normal nutrition, well developed, normal grooming, normal body habitus. Assistive device:none  Musculoskeletal: gait and station Limp right, muscle tone and strength are normal, no tremors or atrophy is present.  .  Neurological: coordination overall normal.  Deep tendon reflex/nerve stretch intact.  Sensation normal.  Cranial nerves II-XII intact.   Skin:   Normal overall no scars, lesions, ulcers or rashes. No psoriasis.  Psychiatric: Alert and oriented x 3.  Recent memory intact, remote memory unclear.  Normal mood and affect. Well groomed.  Good eye contact.  Cardiovascular: overall no swelling, no varicosities, no edema bilaterally, normal temperatures of the legs and arms, no clubbing, cyanosis and good capillary refill.  Lymphatic: palpation is normal.  The bilateral lower extremity is examined:  Inspection:  Thigh:  Non-tender and no defects  Knee has swelling 1+ effusion.                        Joint tenderness is present                        Patient is tender over the medial joint line  Lower Leg:  Has normal appearance and no tenderness or defects  Ankle:  Non-tender and no defects  Foot:  Non-tender and no defects Range of Motion:  Knee:  Range of motion is: 0-105 right, 0-110 left                        Crepitus is  present  Ankle:  Range of motion is normal. Strength and Tone:  The bilateral  lower extremity has normal strength and tone. Stability:  Knee:  The knee is stable.  Ankle:  The ankle is stable.   All other systems reviewed and are negative   The patient has been educated about the nature of the problem(s) and counseled on treatment options.  The patient appeared to understand what I have discussed and is in  agreement with it.  Encounter Diagnoses  Name Primary?  . Chronic pain of left knee Yes  . Chronic pain of right knee     PLAN Call if any problems.  Precautions discussed.  Continue current medications.   Return to clinic 3 months   Electronically Signed Sanjuana Kava, MD 2/27/20199:28 AM

## 2017-05-27 ENCOUNTER — Other Ambulatory Visit: Payer: Self-pay

## 2017-05-27 MED ORDER — INSULIN REGULAR HUMAN (CONC) 500 UNIT/ML ~~LOC~~ SOPN: PEN_INJECTOR | SUBCUTANEOUS | 2 refills | Status: DC

## 2017-05-31 ENCOUNTER — Telehealth: Payer: Self-pay | Admitting: Orthopedic Surgery

## 2017-05-31 NOTE — Telephone Encounter (Signed)
Butch Penny from [facility] called and stated that Ms. Vassell has been transferred to her facility.  She has a question regarding pt's Naprosyn.  Please call Butch Penny at [phone]  Thanks

## 2017-06-01 MED ORDER — NAPROXEN 500 MG PO TABS: 500.0000 mg | ORAL_TABLET | Freq: Two times a day (BID) | ORAL | 5 refills | Status: DC

## 2017-06-01 NOTE — Telephone Encounter (Signed)
Sent in, faxed copy to Butch Penny [phone]

## 2017-06-01 NOTE — Telephone Encounter (Signed)
yes

## 2017-06-01 NOTE — Telephone Encounter (Signed)
Ok to refill Naprosyn 500 bid previously written by Dr Luna Glasgow ?

## 2017-06-01 NOTE — Telephone Encounter (Signed)
Help.

## 2017-06-01 NOTE — Telephone Encounter (Signed)
They need Rx sent to [facility], and copy faxed to them at the facility for the Naprosyn, it was left off of her FL2 form   Can you send in the Rx? Naprosyn 500 bid previously written by Dr Luna Glasgow.

## 2017-06-10 ENCOUNTER — Telehealth: Payer: Self-pay | Admitting: "Endocrinology

## 2017-06-10 NOTE — Telephone Encounter (Signed)
Brittney is calling from [facility] reporting that yesterday evening Before Dinner [name] Blood Sugar Dropped to 61 but after dinner it went back up to 148

## 2017-06-10 NOTE — Telephone Encounter (Signed)
Advise to lower her U500 to 70 units with 3 meals a day if pre-meal blood glucose readings are above 90 mg/dl .

## 2017-06-11 NOTE — Telephone Encounter (Signed)
New orders faxed to [facility]

## 2017-06-28 ENCOUNTER — Ambulatory Visit (INDEPENDENT_AMBULATORY_CARE_PROVIDER_SITE_OTHER): Payer: Medicare Other | Admitting: "Endocrinology

## 2017-06-28 ENCOUNTER — Encounter: Payer: Self-pay | Admitting: "Endocrinology

## 2017-06-28 VITALS — BP 159/93 | HR 97 | Ht 64.5 in | Wt 269.0 lb

## 2017-06-28 DIAGNOSIS — Z794 Long term (current) use of insulin: Secondary | ICD-10-CM | POA: Diagnosis not present

## 2017-06-28 DIAGNOSIS — E1122 Type 2 diabetes mellitus with diabetic chronic kidney disease: Secondary | ICD-10-CM | POA: Diagnosis not present

## 2017-06-28 DIAGNOSIS — N182 Chronic kidney disease, stage 2 (mild): Secondary | ICD-10-CM

## 2017-06-28 DIAGNOSIS — I10 Essential (primary) hypertension: Secondary | ICD-10-CM

## 2017-06-28 MED ORDER — INSULIN REGULAR HUMAN (CONC) 500 UNIT/ML ~~LOC~~ SOPN: PEN_INJECTOR | SUBCUTANEOUS | 2 refills | Status: DC

## 2017-06-28 NOTE — Patient Instructions (Signed)

## 2017-06-28 NOTE — Progress Notes (Signed)
Subjective:    Patient ID: [name], female    DOB: Dec 01, 1955, PCP Rosita Fire, MD   Past Medical History:  Diagnosis Date  . Arthritis   . Asthma   . CHF (congestive heart failure) ([place])   . Diabetes mellitus without complication ([place])   . Hypertension   . Iron deficiency anemia 10/16/2010  . Mental handicap 10/16/2010   Past Surgical History:  Procedure Laterality Date  . ABDOMINAL HYSTERECTOMY    . CESAREAN SECTION    . CHOLECYSTECTOMY    . COLONOSCOPY  08/2010   normal TI, sigmoid polyp (adenoma ). Next TCS due  08/2015,  . ESOPHAGOGASTRODUODENOSCOPY  08/2010   antral and duodenal erosions s/p bx (chronic gastritis, no h.pylori, no celiac dz ), hiatal hernia  . KNEE SURGERY     right knee @ 62 years of age  . LEFT AND RIGHT HEART CATHETERIZATION WITH CORONARY ANGIOGRAM N/A 09/21/2011   Procedure: LEFT AND RIGHT HEART CATHETERIZATION WITH CORONARY ANGIOGRAM;  Surgeon: Birdie Riddle, MD;  Location: [facility];  Service: Cardiovascular;  Laterality: N/A;   Social History   Socioeconomic History  . Marital status: Divorced    Spouse name: Not on file  . Number of children: 1  . Years of education: Not on file  . Highest education level: Not on file  Occupational History    Employer: UNEMPLOYED  Social Needs  . Financial resource strain: Not on file  . Food insecurity:    Worry: Not on file    Inability: Not on file  . Transportation needs:    Medical: Not on file    Non-medical: Not on file  Tobacco Use  . Smoking status: Former Smoker    Packs/day: 1.00    Years: 0.00    Pack years: 0.00    Types: Cigarettes    Last attempt to quit: 04/11/1996    Years since quitting: 21.2  . Smokeless tobacco: Never Used  . Tobacco comment: quit a couple year ago  Substance and Sexual Activity  . Alcohol use: No  . Drug use: No  . Sexual activity: Never  Lifestyle  . Physical activity:    Days per week: Not on file    Minutes per session: Not on file  .  Stress: Not on file  Relationships  . Social connections:    Talks on phone: Not on file    Gets together: Not on file    Attends religious service: Not on file    Active member of club or organization: Not on file    Attends meetings of clubs or organizations: Not on file    Relationship status: Not on file  Other Topics Concern  . Not on file  Social History Narrative   Lives with parents.    Outpatient Encounter Medications as of 06/28/2017  Medication Sig  . ACCU-CHEK AVIVA PLUS test strip USE TO TEST FOUR TIMES DAILY.  Marland Kitchen ACCU-CHEK SOFTCLIX LANCETS lancets USE TO TEST FOUR TIMES DAILY.  Marland Kitchen aspirin 325 MG tablet Take 325 mg by mouth daily.  . Blood Glucose Monitoring Suppl (ACCU-CHEK AVIVA) device Use as instructed qid. e11.65  . budesonide-formoterol (SYMBICORT) 160-4.5 MCG/ACT inhaler Inhale 2 puffs into the lungs 2 (two) times daily.  . Choline Fenofibrate (FENOFIBRIC ACID) 135 MG CPDR Take 1 capsule by mouth daily.  . furosemide (LASIX) 40 MG tablet Take 40 mg by mouth daily.  . hydrALAZINE (APRESOLINE) 25 MG tablet Take 25 mg by mouth  3 (three) times daily.  . insulin regular human CONCENTRATED (HUMULIN R U-500 KWIKPEN) 500 UNIT/ML kwikpen Inject 50 units with breakfast, 50 units with lunch, and 50 units with supper when blood glucose before food is greater than 90 mg/dL  . isosorbide dinitrate (ISORDIL) 20 MG tablet Take 20 mg by mouth 3 (three) times daily.  . isosorbide-hydrALAZINE (BIDIL) 20-37.5 MG per tablet Take 1 tablet by mouth 3 (three) times daily.  Marland Kitchen lisinopril (PRINIVIL,ZESTRIL) 20 MG tablet Take 20 mg by mouth daily.  Marland Kitchen loratadine (CLARITIN) 10 MG tablet Take 10 mg by mouth daily.  . metFORMIN (GLUCOPHAGE) 500 MG tablet Take 1 tablet (500 mg total) by mouth 2 (two) times daily with a meal.  . Multiple Vitamin (MULTIVITAMIN) capsule Take 1 capsule by mouth daily.  . naproxen (NAPROSYN) 500 MG tablet Take 1 tablet (500 mg total) by mouth 2 (two) times daily with a  meal.  . SURE COMFORT PEN NEEDLES 31G X 8 MM MISC USE AS DIRECTED WITH LEVEMIR AND NOVOLOG. UP TO FOUR TIMES DAILY.  . [DISCONTINUED] insulin regular human CONCENTRATED (HUMULIN R U-500 KWIKPEN) 500 UNIT/ML kwikpen Inject 90 units with breakfast, 90 units with lunch, and 80 units with supper when blood glucose before food is greater than 90 mg/dL (Patient taking differently: Inject 70 units with breakfast, 70 units with lunch, and 60 units with supper when blood glucose before food is greater than 90 mg/dL)   No facility-administered encounter medications on file as of 06/28/2017.    ALLERGIES: No Known Allergies VACCINATION STATUS: Immunization History  Administered Date(s) Administered  . Influenza,inj,Quad PF,6+ Mos 02/02/2013    Diabetes  She presents for her follow-up diabetic visit. She has type 2 diabetes mellitus. Onset time: she was diagnosed at approximate age of 11 years. Her disease course has been improving. There are no hypoglycemic associated symptoms. Pertinent negatives for hypoglycemia include no confusion, headaches, pallor or seizures. Associated symptoms include polydipsia and polyuria. Pertinent negatives for diabetes include no blurred vision, no chest pain, no fatigue and no polyphagia. There are no hypoglycemic complications. Symptoms are improving. Diabetic complications include nephropathy. Risk factors for coronary artery disease include diabetes mellitus, dyslipidemia, hypertension, obesity and sedentary lifestyle. Current diabetic treatment includes intensive insulin program. Her weight is increasing steadily. She is following a generally unhealthy diet. She has had a previous visit with a dietitian. She never participates in exercise. Her home blood glucose trend is fluctuating minimally. Her breakfast blood glucose range is generally 140-180 mg/dl. Her lunch blood glucose range is generally 140-180 mg/dl. Her dinner blood glucose range is generally 140-180 mg/dl. Her  bedtime blood glucose range is generally 130-140 mg/dl. Her overall blood glucose range is 140-180 mg/dl. An ACE inhibitor/angiotensin II receptor blocker is being taken.  Hypertension  This is a chronic problem. The current episode started more than 1 year ago. The problem is controlled. Pertinent negatives include no blurred vision, chest pain, headaches, palpitations or shortness of breath. Risk factors for coronary artery disease include diabetes mellitus, dyslipidemia, obesity and sedentary lifestyle. Past treatments include ACE inhibitors.    Review of Systems  Constitutional: Positive for unexpected weight change. Negative for chills, fatigue and fever.  HENT: Negative for trouble swallowing and voice change.   Eyes: Negative for blurred vision and visual disturbance.  Respiratory: Negative for cough, shortness of breath and wheezing.   Cardiovascular: Negative for chest pain, palpitations and leg swelling.  Gastrointestinal: Negative for diarrhea, nausea and vomiting.  Endocrine: Positive for polydipsia  and polyuria. Negative for cold intolerance, heat intolerance and polyphagia.  Musculoskeletal: Negative for arthralgias and myalgias.  Skin: Negative for color change, pallor, rash and wound.  Neurological: Negative for seizures and headaches.  Psychiatric/Behavioral: Negative for confusion and suicidal ideas.    Objective:    BP (!) 159/93   Pulse 97   Ht 5' 4.5" (1.638 m)   Wt 269 lb (122 kg)   BMI 45.46 kg/m   Wt Readings from Last 3 Encounters:  06/28/17 269 lb (122 kg)  05/26/17 259 lb (117.5 kg)  04/14/17 261 lb (118.4 kg)    Physical Exam  Constitutional: She is oriented to person, place, and time. She appears well-developed.  HENT:  Head: Normocephalic and atraumatic.  Eyes: EOM are normal.  Neck: Normal range of motion. Neck supple. No tracheal deviation present. No thyromegaly present.  Cardiovascular: Normal rate.  Pulmonary/Chest: Effort normal.  Abdominal:  There is no tenderness. There is no guarding.  Musculoskeletal: Normal range of motion. She exhibits no edema.  Neurological: She is alert and oriented to person, place, and time. She has normal reflexes. No cranial nerve deficit. Coordination normal.  Skin: Skin is warm and dry. No rash noted. No erythema. No pallor.  Psychiatric: She has a normal mood and affect. Judgment normal.    Results for orders placed or performed in visit on 12/24/16  COMPLETE METABOLIC PANEL WITH GFR  Result Value Ref Range   Glucose, Bld 200 (H) 65 - 99 mg/dL   BUN 64 (H) 7 - 25 mg/dL   Creat 1.44 (H) 0.50 - 0.99 mg/dL   GFR, Est Non African American 39 (L) > OR = 60 mL/min/1.79m2   GFR, Est African American 45 (L) > OR = 60 mL/min/1.75m2   BUN/Creatinine Ratio 44 (H) 6 - 22 (calc)   Sodium 137 135 - 146 mmol/L   Potassium 5.2 3.5 - 5.3 mmol/L   Chloride 103 98 - 110 mmol/L   CO2 26 20 - 32 mmol/L   Calcium 9.3 8.6 - 10.4 mg/dL   Total Protein 6.5 6.1 - 8.1 g/dL   Albumin 3.6 3.6 - 5.1 g/dL   Globulin 2.9 1.9 - 3.7 g/dL (calc)   AG Ratio 1.2 1.0 - 2.5 (calc)   Total Bilirubin 0.2 0.2 - 1.2 mg/dL   Alkaline phosphatase (APISO) 77 33 - 130 U/L   AST 19 10 - 35 U/L   ALT 18 6 - 29 U/L  Hemoglobin A1c  Result Value Ref Range   Hgb A1c MFr Bld 9.3 (H) <5.7 % of total Hgb   Mean Plasma Glucose 220 (calc)   eAG (mmol/L) 12.2 (calc)  TSH  Result Value Ref Range   TSH 2.11 0.40 - 4.50 mIU/L  T4, free  Result Value Ref Range   Free T4 1.2 0.8 - 1.8 ng/dL  Lipid panel  Result Value Ref Range   Cholesterol 152 <200 mg/dL   HDL 38 (L) >50 mg/dL   Triglycerides 244 (H) <150 mg/dL   LDL Cholesterol (Calc) 80 mg/dL (calc)   Total CHOL/HDL Ratio 4.0 <5.0 (calc)   Non-HDL Cholesterol (Calc) 114 <130 mg/dL (calc)  COMPLETE METABOLIC PANEL WITH GFR  Result Value Ref Range   Glucose, Bld 169 (H) 65 - 139 mg/dL   BUN 38 (H) 7 - 25 mg/dL   Creat 1.67 (H) 0.50 - 0.99 mg/dL   GFR, Est Non African American 33  (L) > OR = 60 mL/min/1.101m2   GFR, Est African American  38 (L) > OR = 60 mL/min/1.68m2   BUN/Creatinine Ratio 23 (H) 6 - 22 (calc)   Sodium 136 135 - 146 mmol/L   Potassium 5.0 3.5 - 5.3 mmol/L   Chloride 101 98 - 110 mmol/L   CO2 23 20 - 32 mmol/L   Calcium 9.6 8.6 - 10.4 mg/dL   Total Protein 6.9 6.1 - 8.1 g/dL   Albumin 4.1 3.6 - 5.1 g/dL   Globulin 2.8 1.9 - 3.7 g/dL (calc)   AG Ratio 1.5 1.0 - 2.5 (calc)   Total Bilirubin 0.4 0.2 - 1.2 mg/dL   Alkaline phosphatase (APISO) 40 33 - 130 U/L   AST 19 10 - 35 U/L   ALT 13 6 - 29 U/L   Complete Blood Count (Most recent): Lab Results  Component Value Date   WBC 8.4 02/02/2013   HGB 11.0 (L) 02/02/2013   HCT 32.7 (L) 02/02/2013   MCV 89.1 02/02/2013   PLT 254 02/02/2013   Chemistry (most recent): Lab Results  Component Value Date   NA 137 04/07/2017   K 5.2 04/07/2017   CL 103 04/07/2017   CO2 26 04/07/2017   BUN 64 (H) 04/07/2017   CREATININE 1.44 (H) 04/07/2017   Diabetic Labs (most recent): Lab Results  Component Value Date   HGBA1C 9.3 (H) 04/07/2017   HGBA1C 8.7 (H) 07/23/2016   HGBA1C 8.2 (H) 04/02/2016   Lipid Panel     Component Value Date/Time   CHOL 152 04/07/2017 0928   TRIG 244 (H) 04/07/2017 0928   HDL 38 (L) 04/07/2017 0928   CHOLHDL 4.0 04/07/2017 0928   LDLCALC 80 04/07/2017 0928    Assessment & Plan:   1. Type 2 diabetes mellitus with stage 2 chronic kidney disease, with long-term current use of insulin (HCC)  -Her diabetes is  complicated by CKD and patient remains at a high risk for more acute and chronic complications of diabetes which include CAD, CVA, CKD, retinopathy, and neuropathy. These are all discussed in detail with the patient.  Patient is now in a nursing home, returns before her scheduled visit due to significant hypoglycemia.  She is more consistent getting her timely insulin injections this time.  - Her  recent A1c  is   9.3%, slowly increasing from 8.2%.  After overall  improved  from 14.1%. -  Glucose logs and insulin administration records pertaining to this visit,  to be scanned into patient's records.    - I have re-counseled the patient on diet management and weight loss  by adopting a carbohydrate restricted / protein rich  Diet.  -  Suggestion is made for her to avoid simple carbohydrates  from her diet including Cakes, Sweet Desserts / Pastries, Ice Cream, Soda (diet and regular), Sweet Tea, Candies, Chips, Cookies, Store Bought Juices, Alcohol in Excess of  1-2 drinks a day, Artificial Sweeteners, and "Sugar-free" Products. This will help patient to have stable blood glucose profile and potentially avoid unintended weight gain.  - Patient is advised to stick to a routine mealtimes to eat 3 meals  a day and avoid unnecessary snacks ( to snack only to correct hypoglycemia).  - I have approached patient with the following individualized plan to manage diabetes and patient agrees.  -Her placement in group home is the best development since her last visit.   -Her presenting hypoglycemia episodes are due to less caloric intake compared to her previous life.   -I will proceed to lower her insulin dose.  She will still be continued on insulin Humulin R U500.    - I will decrease her dose of  U500   to 50 units  with breakfast, 50 units with lunch, and 50 units before supper for pre-meal blood glucose above 90 associated with monitoring of blood glucose before meals and at bedtime. - She is advised to skip insulin if pre-meal blood glucose is below 90 mg/dL. -Continue Metformin 500mg  by mouth twice a day.   - Patient specific target  for A1c; LDL, HDL, Triglycerides, and  Waist Circumference were discussed in detail.  2) BP/HTN: Her blood pressure is not controlled to target.  I advised her to continue current medications including ACEI/ARB. 3) Lipids/HPL: Controlled with LDL of 80.  She is advised to continue statins. 4)  Weight/Diet: CDE consult in  progress, exercise, and carbohydrates information provided.  5) Chronic Care/Health Maintenance:  -Patient is  on ACEI and encouraged to continue to follow up with Ophthalmology, Podiatrist at least yearly or according to recommendations, and advised to stay away from smoking. I have recommended yearly flu vaccine and pneumonia vaccination at least every 5 years;  and  sleep for at least 7 hours a day. -she has serious non compliance to dietary recomendations.     - I advised patient to maintain close follow up with Rosita Fire, MD for primary care needs.  - Time spent with the patient: 25 min, of which >50% was spent in reviewing her blood glucose logs , discussing her hypo- and hyper-glycemic episodes, reviewing her current and  previous labs and insulin doses and developing a plan to avoid hypo- and hyper-glycemia. Please refer to Patient Instructions for Blood Glucose Monitoring and Insulin/Medications Dosing Guide"  in media tab for additional information. [name] participated in the discussions, expressed understanding, and voiced agreement with the above plans.  All questions were answered to her satisfaction. she is encouraged to contact clinic should she have any questions or concerns prior to her return visit.   Follow up plan: -Return Keep regular appointment, for meter, and logs.  Glade Lloyd, MD Phone: [phone]  Fax: [phone]  This note was partially dictated with voice recognition software. Similar sounding words can be transcribed inadequately or may not  be corrected upon review.  06/28/2017, 12:57 PM

## 2017-07-14 LAB — HEMOGLOBIN A1C
Hgb A1c MFr Bld: 7.2 % of total Hgb — ABNORMAL HIGH (ref <5.7)
Mean Plasma Glucose: 160 (calc)
eAG (mmol/L): 8.9 (calc)

## 2017-07-14 LAB — COMPLETE METABOLIC PANEL WITH GFR
AG Ratio: 1.2 (calc) (ref 1.0–2.5)
ALT: 15 U/L (ref 6–29)
AST: 14 U/L (ref 10–35)
Albumin: 3.5 g/dL — ABNORMAL LOW (ref 3.6–5.1)
Alkaline phosphatase (APISO): 89 U/L (ref 33–130)
BUN/Creatinine Ratio: 18 (calc) (ref 6–22)
BUN: 32 mg/dL — ABNORMAL HIGH (ref 7–25)
CO2: 29 mmol/L (ref 20–32)
Calcium: 8.7 mg/dL (ref 8.6–10.4)
Chloride: 104 mmol/L (ref 98–110)
Creat: 1.82 mg/dL — ABNORMAL HIGH (ref 0.50–0.99)
GFR, Est African American: 34 mL/min/{1.73_m2} — ABNORMAL LOW (ref >=60)
GFR, Est Non African American: 29 mL/min/{1.73_m2} — ABNORMAL LOW (ref >=60)
Globulin: 2.9 g/dL (calc) (ref 1.9–3.7)
Glucose, Bld: 49 mg/dL — ABNORMAL LOW (ref 65–139)
Potassium: 4.5 mmol/L (ref 3.5–5.3)
Sodium: 141 mmol/L (ref 135–146)
Total Bilirubin: 0.2 mg/dL (ref 0.2–1.2)
Total Protein: 6.4 g/dL (ref 6.1–8.1)

## 2017-07-14 LAB — MICROALBUMIN / CREATININE URINE RATIO
Creatinine, Urine: 22 mg/dL (ref 20–275)
Microalb Creat Ratio: 9 mcg/mg creat (ref <30)
Microalb, Ur: 0.2 mg/dL

## 2017-07-14 LAB — VITAMIN D 25 HYDROXY (VIT D DEFICIENCY, FRACTURES): Vit D, 25-Hydroxy: 34 ng/mL (ref 30–100)

## 2017-07-20 DIAGNOSIS — M79674 Pain in right toe(s): Secondary | ICD-10-CM | POA: Diagnosis not present

## 2017-07-20 DIAGNOSIS — B351 Tinea unguium: Secondary | ICD-10-CM | POA: Diagnosis not present

## 2017-07-20 DIAGNOSIS — M79675 Pain in left toe(s): Secondary | ICD-10-CM | POA: Diagnosis not present

## 2017-07-21 ENCOUNTER — Telehealth: Payer: Self-pay | Admitting: "Endocrinology

## 2017-07-21 ENCOUNTER — Ambulatory Visit (INDEPENDENT_AMBULATORY_CARE_PROVIDER_SITE_OTHER): Payer: Medicare Other | Admitting: "Endocrinology

## 2017-07-21 ENCOUNTER — Encounter: Payer: Self-pay | Admitting: "Endocrinology

## 2017-07-21 VITALS — BP 153/77 | HR 70 | Ht 64.5 in | Wt 265.0 lb

## 2017-07-21 DIAGNOSIS — IMO0002 Reserved for concepts with insufficient information to code with codable children: Secondary | ICD-10-CM

## 2017-07-21 DIAGNOSIS — I10 Essential (primary) hypertension: Secondary | ICD-10-CM | POA: Diagnosis not present

## 2017-07-21 DIAGNOSIS — E1165 Type 2 diabetes mellitus with hyperglycemia: Secondary | ICD-10-CM

## 2017-07-21 DIAGNOSIS — E782 Mixed hyperlipidemia: Secondary | ICD-10-CM | POA: Diagnosis not present

## 2017-07-21 DIAGNOSIS — N184 Chronic kidney disease, stage 4 (severe): Secondary | ICD-10-CM

## 2017-07-21 DIAGNOSIS — E1122 Type 2 diabetes mellitus with diabetic chronic kidney disease: Secondary | ICD-10-CM | POA: Diagnosis not present

## 2017-07-21 NOTE — Patient Instructions (Signed)

## 2017-07-21 NOTE — Progress Notes (Signed)
Subjective:    Patient ID: [name], female    DOB: [birth year], PCP Rosita Fire, MD   Past Medical History:  Diagnosis Date  . Arthritis   . Asthma   . CHF (congestive heart failure) ([place])   . Diabetes mellitus without complication ([place])   . Hypertension   . Iron deficiency anemia 10/16/2010  . Mental handicap 10/16/2010   Past Surgical History:  Procedure Laterality Date  . ABDOMINAL HYSTERECTOMY    . CESAREAN SECTION    . CHOLECYSTECTOMY    . COLONOSCOPY  08/2010   normal TI, sigmoid polyp (adenoma ). Next TCS due  08/2015,  . ESOPHAGOGASTRODUODENOSCOPY  08/2010   antral and duodenal erosions s/p bx (chronic gastritis, no h.pylori, no celiac dz ), hiatal hernia  . KNEE SURGERY     right knee @ 62 years of age  . LEFT AND RIGHT HEART CATHETERIZATION WITH CORONARY ANGIOGRAM N/A 09/21/2011   Procedure: LEFT AND RIGHT HEART CATHETERIZATION WITH CORONARY ANGIOGRAM;  Surgeon: Birdie Riddle, MD;  Location: [facility];  Service: Cardiovascular;  Laterality: N/A;   Social History   Socioeconomic History  . Marital status: Divorced    Spouse name: Not on file  . Number of children: 1  . Years of education: Not on file  . Highest education level: Not on file  Occupational History    Employer: UNEMPLOYED  Social Needs  . Financial resource strain: Not on file  . Food insecurity:    Worry: Not on file    Inability: Not on file  . Transportation needs:    Medical: Not on file    Non-medical: Not on file  Tobacco Use  . Smoking status: Former Smoker    Packs/day: 1.00    Years: 0.00    Pack years: 0.00    Types: Cigarettes    Last attempt to quit: 04/11/1996    Years since quitting: 21.2  . Smokeless tobacco: Never Used  . Tobacco comment: quit a couple year ago  Substance and Sexual Activity  . Alcohol use: No  . Drug use: No  . Sexual activity: Never  Lifestyle  . Physical activity:    Days per week: Not on file    Minutes per session: Not on file  .  Stress: Not on file  Relationships  . Social connections:    Talks on phone: Not on file    Gets together: Not on file    Attends religious service: Not on file    Active member of club or organization: Not on file    Attends meetings of clubs or organizations: Not on file    Relationship status: Not on file  Other Topics Concern  . Not on file  Social History Narrative   Lives with parents.    Outpatient Encounter Medications as of 07/21/2017  Medication Sig  . furosemide (LASIX) 40 MG tablet Take 40 mg by mouth daily.  . hydrALAZINE (APRESOLINE) 25 MG tablet Take 25 mg by mouth 3 (three) times daily.  . insulin regular human CONCENTRATED (HUMULIN R U-500 KWIKPEN) 500 UNIT/ML kwikpen Inject 50 units with breakfast, 50 units with lunch, and 50 units with supper when blood glucose before food is greater than 90 mg/dL  . isosorbide-hydrALAZINE (BIDIL) 20-37.5 MG per tablet Take 1 tablet by mouth 3 (three) times daily.  Marland Kitchen lisinopril (PRINIVIL,ZESTRIL) 20 MG tablet Take 20 mg by mouth daily.  . [DISCONTINUED] metFORMIN (GLUCOPHAGE) 500 MG tablet Take 1 tablet (500  mg total) by mouth 2 (two) times daily with a meal.  . ACCU-CHEK AVIVA PLUS test strip USE TO TEST FOUR TIMES DAILY.  Marland Kitchen ACCU-CHEK SOFTCLIX LANCETS lancets USE TO TEST FOUR TIMES DAILY.  Marland Kitchen aspirin 325 MG tablet Take 325 mg by mouth daily.  . Blood Glucose Monitoring Suppl (ACCU-CHEK AVIVA) device Use as instructed qid. e11.65  . budesonide-formoterol (SYMBICORT) 160-4.5 MCG/ACT inhaler Inhale 2 puffs into the lungs 2 (two) times daily.  . Choline Fenofibrate (FENOFIBRIC ACID) 135 MG CPDR Take 1 capsule by mouth daily.  . isosorbide dinitrate (ISORDIL) 20 MG tablet Take 20 mg by mouth 3 (three) times daily.  Marland Kitchen loratadine (CLARITIN) 10 MG tablet Take 10 mg by mouth daily.  . Multiple Vitamin (MULTIVITAMIN) capsule Take 1 capsule by mouth daily.  . naproxen (NAPROSYN) 500 MG tablet Take 1 tablet (500 mg total) by mouth 2 (two) times  daily with a meal.  . SURE COMFORT PEN NEEDLES 31G X 8 MM MISC USE AS DIRECTED WITH LEVEMIR AND NOVOLOG. UP TO FOUR TIMES DAILY.   No facility-administered encounter medications on file as of 07/21/2017.    ALLERGIES: No Known Allergies VACCINATION STATUS: Immunization History  Administered Date(s) Administered  . Influenza,inj,Quad PF,6+ Mos 02/02/2013    Diabetes  She presents for her follow-up diabetic visit. She has type 2 diabetes mellitus. Onset time: she was diagnosed at approximate age of 25 years. Her disease course has been improving. There are no hypoglycemic associated symptoms. Pertinent negatives for hypoglycemia include no confusion, headaches, pallor or seizures. Associated symptoms include polydipsia and polyuria. Pertinent negatives for diabetes include no blurred vision, no chest pain, no fatigue and no polyphagia. There are no hypoglycemic complications. Symptoms are improving. Diabetic complications include nephropathy. Risk factors for coronary artery disease include diabetes mellitus, dyslipidemia, hypertension, obesity and sedentary lifestyle. Current diabetic treatment includes intensive insulin program. Her weight is fluctuating minimally. She is following a generally unhealthy diet. She has had a previous visit with a dietitian. She never participates in exercise. Her home blood glucose trend is decreasing steadily. Her breakfast blood glucose range is generally 140-180 mg/dl. Her lunch blood glucose range is generally 140-180 mg/dl. Her dinner blood glucose range is generally 140-180 mg/dl. Her bedtime blood glucose range is generally 140-180 mg/dl. Her overall blood glucose range is 140-180 mg/dl. An ACE inhibitor/angiotensin II receptor blocker is being taken.  Hypertension  This is a chronic problem. The current episode started more than 1 year ago. The problem is uncontrolled. Pertinent negatives include no blurred vision, chest pain, headaches, palpitations or  shortness of breath. Risk factors for coronary artery disease include diabetes mellitus, dyslipidemia, obesity and sedentary lifestyle. Past treatments include ACE inhibitors. Hypertensive end-organ damage includes kidney disease. Identifiable causes of hypertension include chronic renal disease.  Hyperlipidemia  This is a chronic problem. The current episode started more than 1 year ago. The problem is uncontrolled. Exacerbating diseases include chronic renal disease, diabetes and obesity. Pertinent negatives include no chest pain, myalgias or shortness of breath. Current antihyperlipidemic treatment includes statins and fibric acid derivatives. Risk factors for coronary artery disease include diabetes mellitus, dyslipidemia, obesity, a sedentary lifestyle and hypertension.    Review of Systems  Constitutional: Positive for unexpected weight change. Negative for chills, fatigue and fever.  HENT: Negative for trouble swallowing and voice change.   Eyes: Negative for blurred vision and visual disturbance.  Respiratory: Negative for cough, shortness of breath and wheezing.   Cardiovascular: Negative for chest pain, palpitations  and leg swelling.  Gastrointestinal: Negative for diarrhea, nausea and vomiting.  Endocrine: Positive for polydipsia and polyuria. Negative for cold intolerance, heat intolerance and polyphagia.  Musculoskeletal: Negative for arthralgias and myalgias.  Skin: Negative for color change, pallor, rash and wound.  Neurological: Negative for seizures and headaches.  Psychiatric/Behavioral: Negative for confusion and suicidal ideas.    Objective:    BP (!) 153/77   Pulse 70   Ht 5' 4.5" (1.638 m)   Wt 265 lb (120.2 kg)   BMI 44.78 kg/m   Wt Readings from Last 3 Encounters:  07/21/17 265 lb (120.2 kg)  06/28/17 269 lb (122 kg)  05/26/17 259 lb (117.5 kg)    Physical Exam  Constitutional: She is oriented to person, place, and time. She appears well-developed.  HENT:   Head: Normocephalic and atraumatic.  Eyes: EOM are normal.  Neck: Normal range of motion. Neck supple. No tracheal deviation present. No thyromegaly present.  Cardiovascular: Normal rate.  Pulmonary/Chest: Effort normal.  Abdominal: There is no tenderness. There is no guarding.  Musculoskeletal: Normal range of motion. She exhibits no edema.  Neurological: She is alert and oriented to person, place, and time. She has normal reflexes. No cranial nerve deficit. Coordination normal.  Skin: Skin is warm and dry. No rash noted. No erythema. No pallor.  Psychiatric: She has a normal mood and affect. Judgment normal.    Results for orders placed or performed in visit on 04/14/17  COMPLETE METABOLIC PANEL WITH GFR  Result Value Ref Range   Glucose, Bld 49 (L) 65 - 139 mg/dL   BUN 32 (H) 7 - 25 mg/dL   Creat 1.82 (H) 0.50 - 0.99 mg/dL   GFR, Est Non African American 29 (L) > OR = 60 mL/min/1.61m2   GFR, Est African American 34 (L) > OR = 60 mL/min/1.26m2   BUN/Creatinine Ratio 18 6 - 22 (calc)   Sodium 141 135 - 146 mmol/L   Potassium 4.5 3.5 - 5.3 mmol/L   Chloride 104 98 - 110 mmol/L   CO2 29 20 - 32 mmol/L   Calcium 8.7 8.6 - 10.4 mg/dL   Total Protein 6.4 6.1 - 8.1 g/dL   Albumin 3.5 (L) 3.6 - 5.1 g/dL   Globulin 2.9 1.9 - 3.7 g/dL (calc)   AG Ratio 1.2 1.0 - 2.5 (calc)   Total Bilirubin 0.2 0.2 - 1.2 mg/dL   Alkaline phosphatase (APISO) 89 33 - 130 U/L   AST 14 10 - 35 U/L   ALT 15 6 - 29 U/L  Hemoglobin A1c  Result Value Ref Range   Hgb A1c MFr Bld 7.2 (H) <5.7 % of total Hgb   Mean Plasma Glucose 160 (calc)   eAG (mmol/L) 8.9 (calc)  Microalbumin / creatinine urine ratio  Result Value Ref Range   Creatinine, Urine 22 20 - 275 mg/dL   Microalb, Ur 0.2 mg/dL   Microalb Creat Ratio 9 <30 mcg/mg creat  VITAMIN D 25 Hydroxy (Vit-D Deficiency, Fractures)  Result Value Ref Range   Vit D, 25-Hydroxy 34 30 - 100 ng/mL   Complete Blood Count (Most recent): Lab Results   Component Value Date   WBC 8.4 02/02/2013   HGB 11.0 (L) 02/02/2013   HCT 32.7 (L) 02/02/2013   MCV 89.1 02/02/2013   PLT 254 02/02/2013   Chemistry (most recent): Lab Results  Component Value Date   NA 141 07/13/2017   K 4.5 07/13/2017   CL 104 07/13/2017   CO2 29  07/13/2017   BUN 32 (H) 07/13/2017   CREATININE 1.82 (H) 07/13/2017   Diabetic Labs (most recent): Lab Results  Component Value Date   HGBA1C 7.2 (H) 07/13/2017   HGBA1C 9.3 (H) 04/07/2017   HGBA1C 8.7 (H) 07/23/2016   Lipid Panel     Component Value Date/Time   CHOL 152 04/07/2017 0928   TRIG 244 (H) 04/07/2017 0928   HDL 38 (L) 04/07/2017 0928   CHOLHDL 4.0 04/07/2017 0928   LDLCALC 80 04/07/2017 0928    Assessment & Plan:   1. Type 2 diabetes mellitus with stage 2 chronic kidney disease, with long-term current use of insulin (HCC)  -Her diabetes is  complicated by CKD and patient remains at a high risk for more acute and chronic complications of diabetes which include CAD, CVA, CKD, retinopathy, and neuropathy. These are all discussed in detail with the patient.  Patient is now in a nursing home, returns with continued improvement in her glucose profile.  No documented or reported major hypoglycemia.  She is more consistent getting her timely insulin injections this time.  - Her previsit labs show improved A1c of 7.2% compared to 9.3% during last visit, overall improving from 14.1%.    -  Glucose logs and insulin administration records pertaining to this visit,  to be scanned into patient's records.    - I have re-counseled the patient on diet management and weight loss  by adopting a carbohydrate restricted / protein rich  Diet.  -  Suggestion is made for her to avoid simple carbohydrates  from her diet including Cakes, Sweet Desserts / Pastries, Ice Cream, Soda (diet and regular), Sweet Tea, Candies, Chips, Cookies, Store Bought Juices, Alcohol in Excess of  1-2 drinks a day, Artificial Sweeteners,  and "Sugar-free" Products. This will help patient to have stable blood glucose profile and potentially avoid unintended weight gain.  - Patient is advised to stick to a routine mealtimes to eat 3 meals  a day and avoid unnecessary snacks ( to snack only to correct hypoglycemia).  - I have approached patient with the following individualized plan to manage diabetes and patient agrees.  -Her placement in group home is the best development for her lately.  -Her presenting glycemic profile is favorable.   -She will still require intensive insulin treatment to maintain control of diabetes with  Humulin R U500.    - I advised her to continue Humulin U500  at 50 units  with breakfast, 50 units with lunch, and 50 units before supper for pre-meal blood glucose above 90 associated with monitoring of blood glucose before meals and at bedtime. - She is advised to skip insulin if pre-meal blood glucose is below 90 mg/dL. -Due to advancing renal insufficiency, she is advised to discontinue metformin.    - Patient specific target  for A1c; LDL, HDL, Triglycerides, and  Waist Circumference were discussed in detail.  2) BP/HTN: Her blood pressure is not controlled to target.  She is advised to restrict salt intake and I advised her to continue current medications including ACEI/ARB. 3) Lipids/HPL: Controlled with LDL of 80.  She is on fenofibrate's and advised to continue.   4)  Weight/Diet: CDE consult in progress, exercise, and carbohydrates information provided.  5) Chronic Care/Health Maintenance:  -Patient is  on ACEI and encouraged to continue to follow up with Ophthalmology, Podiatrist at least yearly or according to recommendations, and advised to stay away from smoking. I have recommended yearly flu vaccine and pneumonia  vaccination at least every 5 years;  and  sleep for at least 7 hours a day. -she has serious non compliance to dietary recomendations.     - I advised patient to maintain close  follow up with Rosita Fire, MD for primary care needs.  - Time spent with the patient: 25 min, of which >50% was spent in reviewing her blood glucose logs , discussing her hypo- and hyper-glycemic episodes, reviewing her current and  previous labs and insulin doses and developing a plan to avoid hypo- and hyper-glycemia. Please refer to Patient Instructions for Blood Glucose Monitoring and Insulin/Medications Dosing Guide"  in media tab for additional information. [name] participated in the discussions, expressed understanding, and voiced agreement with the above plans.  All questions were answered to her satisfaction. she is encouraged to contact clinic should she have any questions or concerns prior to her return visit.  Follow up plan: -Return in about 4 months (around 11/20/2017) for meter, and logs, follow up with pre-visit labs, meter, and logs.  Glade Lloyd, MD Phone: [phone]  Fax: [phone]  This note was partially dictated with voice recognition software. Similar sounding words can be transcribed inadequately or may not  be corrected upon review.  07/21/2017, 12:43 PM

## 2017-07-21 NOTE — Telephone Encounter (Signed)
Silvia from [facility] long term care called to give fasting sugar levels for patient 11:45 > 72 1pm > 84  130pm> 84 They have her eating candy and drinking a drink.  She had lunch w/ no shot.  Cb#: [phone]

## 2017-07-22 ENCOUNTER — Ambulatory Visit (HOSPITAL_COMMUNITY)
Admission: RE | Admit: 2017-07-22 | Discharge: 2017-07-22 | Disposition: A | Discharge status: Home / Self Care | Payer: Medicare Other | Source: Ambulatory Visit | Attending: Internal Medicine | Admitting: Internal Medicine

## 2017-07-22 ENCOUNTER — Other Ambulatory Visit (HOSPITAL_COMMUNITY): Payer: Self-pay | Admitting: Internal Medicine

## 2017-07-22 DIAGNOSIS — R05 Cough: Secondary | ICD-10-CM | POA: Diagnosis not present

## 2017-07-22 DIAGNOSIS — R059 Cough, unspecified: Secondary | ICD-10-CM

## 2017-07-22 DIAGNOSIS — I517 Cardiomegaly: Secondary | ICD-10-CM | POA: Insufficient documentation

## 2017-07-22 NOTE — Telephone Encounter (Signed)
Called [facility] and asked them to fax over BG logs

## 2017-07-23 ENCOUNTER — Other Ambulatory Visit (HOSPITAL_COMMUNITY): Payer: Self-pay | Admitting: Internal Medicine

## 2017-07-23 DIAGNOSIS — R9389 Abnormal findings on diagnostic imaging of other specified body structures: Secondary | ICD-10-CM

## 2017-07-29 ENCOUNTER — Other Ambulatory Visit: Payer: Self-pay

## 2017-07-29 MED ORDER — INSULIN PEN NEEDLE 30G X 8 MM MISC: 1.0000 | Freq: Three times a day (TID) | 5 refills | Status: DC

## 2017-08-10 ENCOUNTER — Ambulatory Visit (HOSPITAL_COMMUNITY)
Admission: RE | Admit: 2017-08-10 | Discharge: 2017-08-10 | Disposition: A | Discharge status: Home / Self Care | Payer: Medicare Other | Source: Ambulatory Visit | Attending: Internal Medicine | Admitting: Internal Medicine

## 2017-08-10 DIAGNOSIS — J9811 Atelectasis: Secondary | ICD-10-CM | POA: Diagnosis not present

## 2017-08-10 DIAGNOSIS — R9389 Abnormal findings on diagnostic imaging of other specified body structures: Secondary | ICD-10-CM

## 2017-08-10 DIAGNOSIS — X58XXXA Exposure to other specified factors, initial encounter: Secondary | ICD-10-CM | POA: Insufficient documentation

## 2017-08-10 DIAGNOSIS — S22079A Unspecified fracture of T9-T10 vertebra, initial encounter for closed fracture: Secondary | ICD-10-CM | POA: Insufficient documentation

## 2017-08-10 DIAGNOSIS — E279 Disorder of adrenal gland, unspecified: Secondary | ICD-10-CM | POA: Diagnosis not present

## 2017-08-10 DIAGNOSIS — I700 Atherosclerosis of aorta: Secondary | ICD-10-CM | POA: Diagnosis not present

## 2017-08-10 DIAGNOSIS — R918 Other nonspecific abnormal finding of lung field: Secondary | ICD-10-CM | POA: Insufficient documentation

## 2017-08-10 LAB — POCT I-STAT CREATININE: Creatinine, Ser: 1.6 mg/dL — ABNORMAL HIGH (ref 0.44–1.00)

## 2017-08-10 MED ORDER — IOPAMIDOL (ISOVUE-300) INJECTION 61%
75.0000 mL | Freq: Once | INTRAVENOUS | Status: AC | PRN
  Administered 2017-08-10: 60 mL via INTRAVENOUS

## 2017-08-12 ENCOUNTER — Other Ambulatory Visit: Payer: Self-pay | Admitting: "Endocrinology

## 2017-08-12 ENCOUNTER — Other Ambulatory Visit (HOSPITAL_COMMUNITY): Payer: Self-pay | Admitting: Internal Medicine

## 2017-08-12 DIAGNOSIS — I700 Atherosclerosis of aorta: Secondary | ICD-10-CM

## 2017-08-12 DIAGNOSIS — R935 Abnormal findings on diagnostic imaging of other abdominal regions, including retroperitoneum: Secondary | ICD-10-CM

## 2017-08-19 ENCOUNTER — Encounter (HOSPITAL_COMMUNITY): Payer: Self-pay

## 2017-08-19 ENCOUNTER — Ambulatory Visit (HOSPITAL_COMMUNITY)
Admission: RE | Admit: 2017-08-19 | Discharge: 2017-08-19 | Disposition: A | Discharge status: Home / Self Care | Payer: Medicare Other | Source: Ambulatory Visit | Attending: Internal Medicine | Admitting: Internal Medicine

## 2017-08-19 DIAGNOSIS — R935 Abnormal findings on diagnostic imaging of other abdominal regions, including retroperitoneum: Secondary | ICD-10-CM

## 2017-08-19 DIAGNOSIS — I700 Atherosclerosis of aorta: Secondary | ICD-10-CM

## 2017-08-24 ENCOUNTER — Ambulatory Visit (INDEPENDENT_AMBULATORY_CARE_PROVIDER_SITE_OTHER): Payer: Medicare Other | Admitting: Orthopaedic Surgery

## 2017-08-24 ENCOUNTER — Encounter: Payer: Self-pay | Admitting: Orthopaedic Surgery

## 2017-08-24 VITALS — BP 142/79 | HR 74 | Ht 64.5 in | Wt 263.0 lb

## 2017-08-24 DIAGNOSIS — I10 Essential (primary) hypertension: Secondary | ICD-10-CM | POA: Diagnosis not present

## 2017-08-24 DIAGNOSIS — G8929 Other chronic pain: Secondary | ICD-10-CM | POA: Diagnosis not present

## 2017-08-24 DIAGNOSIS — F79 Unspecified intellectual disabilities: Secondary | ICD-10-CM

## 2017-08-24 DIAGNOSIS — M25562 Pain in left knee: Secondary | ICD-10-CM

## 2017-08-24 DIAGNOSIS — M25561 Pain in right knee: Secondary | ICD-10-CM

## 2017-08-24 NOTE — Progress Notes (Signed)
Patient VW:[id number] A Lingo, female DOB:16-Jun-1955, 62 y.o. NWG:[id number]  Chief Complaint  Patient presents with  . Follow-up    chronic bilateral knee pain    HPI  [name] is a 62 y.o. female who has bilateral knee pain, swelling and popping.  She has had increased pain in the left knee and slight tendency to give way.  She does not want to consider surgery or injection.  She is resident at local rest home. HPI  Body mass index is 44.45 kg/m.  ROS  Review of Systems  HENT: Negative for congestion.   Respiratory: Positive for shortness of breath. Negative for cough.   Cardiovascular: Positive for leg swelling. Negative for chest pain.  Endocrine: Positive for cold intolerance.  Musculoskeletal: Positive for arthralgias, back pain, gait problem and joint swelling.  Allergic/Immunologic: Positive for environmental allergies.  All other systems reviewed and are negative.   Past Medical History:  Diagnosis Date  . Arthritis   . Asthma   . CHF (congestive heart failure) ([place])   . Diabetes mellitus without complication ([place])   . Hypertension   . Iron deficiency anemia 10/16/2010  . Mental handicap 10/16/2010    Past Surgical History:  Procedure Laterality Date  . ABDOMINAL HYSTERECTOMY    . CESAREAN SECTION    . CHOLECYSTECTOMY    . COLONOSCOPY  08/2010   normal TI, sigmoid polyp (adenoma ). Next TCS due  08/2015,  . ESOPHAGOGASTRODUODENOSCOPY  08/2010   antral and duodenal erosions s/p bx (chronic gastritis, no h.pylori, no celiac dz ), hiatal hernia  . KNEE SURGERY     right knee @ 62 years of age  . LEFT AND RIGHT HEART CATHETERIZATION WITH CORONARY ANGIOGRAM N/A 09/21/2011   Procedure: LEFT AND RIGHT HEART CATHETERIZATION WITH CORONARY ANGIOGRAM;  Surgeon: Birdie Riddle, MD;  Location: [facility];  Service: Cardiovascular;  Laterality: N/A;    Family History  Problem Relation Age of Onset  . Diabetes Mother   . Hypertension Mother   . Hypertension Father    . Kidney disease Unknown        son reportedly has had cyst on kidney and lung s/p surgery [facility]  . Hypertension Son   . Colon cancer Neg Hx     Social History Social History   Tobacco Use  . Smoking status: Former Smoker    Packs/day: 1.00    Years: 0.00    Pack years: 0.00    Types: Cigarettes    Last attempt to quit: 04/11/1996    Years since quitting: 21.3  . Smokeless tobacco: Never Used  . Tobacco comment: quit a couple year ago  Substance Use Topics  . Alcohol use: No  . Drug use: No    No Known Allergies  Current Outpatient Medications  Medication Sig Dispense Refill  . ACCU-CHEK AVIVA PLUS test strip USE TO TEST FOUR TIMES DAILY. 150 each 5  . ACCU-CHEK SOFTCLIX LANCETS lancets USE TO TEST FOUR TIMES DAILY. 150 each 5  . aspirin 325 MG tablet Take 325 mg by mouth daily.    . Blood Glucose Monitoring Suppl (ACCU-CHEK AVIVA) device Use as instructed qid. e11.65 1 each 0  . budesonide-formoterol (SYMBICORT) 160-4.5 MCG/ACT inhaler Inhale 2 puffs into the lungs 2 (two) times daily.    . Choline Fenofibrate (FENOFIBRIC ACID) 135 MG CPDR Take 1 capsule by mouth daily.    . furosemide (LASIX) 40 MG tablet Take 40 mg by mouth daily.    Marland Kitchen  hydrALAZINE (APRESOLINE) 25 MG tablet Take 25 mg by mouth 3 (three) times daily.    . insulin regular human CONCENTRATED (HUMULIN R U-500 KWIKPEN) 500 UNIT/ML kwikpen Inject 50 units with breakfast, 50 units with lunch, and 50 units with supper when blood glucose before food is greater than 90 mg/dL 12 mL 2  . isosorbide dinitrate (ISORDIL) 20 MG tablet Take 20 mg by mouth 3 (three) times daily.    . isosorbide-hydrALAZINE (BIDIL) 20-37.5 MG per tablet Take 1 tablet by mouth 3 (three) times daily.    Marland Kitchen lisinopril (PRINIVIL,ZESTRIL) 20 MG tablet Take 20 mg by mouth daily.    Marland Kitchen loratadine (CLARITIN) 10 MG tablet Take 10 mg by mouth daily.    . Multiple Vitamin (MULTIVITAMIN) capsule Take 1 capsule by mouth daily.    . naproxen (NAPROSYN)  500 MG tablet Take 1 tablet (500 mg total) by mouth 2 (two) times daily with a meal. 60 tablet 5  . NOVOFINE AUTOCOVER 30G X 8 MM MISC USE AS DIRECTED FOR INSULIN ADMINISTRATION. 100 each 3  . SURE COMFORT PEN NEEDLES 31G X 8 MM MISC USE AS DIRECTED WITH LEVEMIR AND NOVOLOG. UP TO FOUR TIMES DAILY. 100 each 5   No current facility-administered medications for this visit.      Physical Exam  Blood pressure (!) 142/79, pulse 74, height 5' 4.5" (1.638 m), weight 263 lb (119.3 kg).  Constitutional: overall normal hygiene, normal nutrition, well developed, normal grooming, normal body habitus. Assistive device:none  Musculoskeletal: gait and station Limp left, muscle tone and strength are normal, no tremors or atrophy is present.  .  Neurological: coordination overall normal.  Deep tendon reflex/nerve stretch intact.  Sensation normal.  Cranial nerves II-XII intact.   Skin:   Normal overall no scars, lesions, ulcers or rashes. No psoriasis.  Psychiatric: Alert and oriented x 3.  Recent memory intact, remote memory unclear.  Normal mood and affect. Well groomed.  Good eye contact.  Cardiovascular: overall no swelling, no varicosities, no edema bilaterally, normal temperatures of the legs and arms, no clubbing, cyanosis and good capillary refill.  Lymphatic: palpation is normal.  The bilateral lower extremity is examined:  Inspection:  Thigh:  Non-tender and no defects  Knee has swelling 1+ effusion.                        Joint tenderness is present                        Patient is tender over the medial joint line  Lower Leg:  Has normal appearance and no tenderness or defects  Ankle:  Non-tender and no defects  Foot:  Non-tender and no defects Range of Motion:  Knee:  Range of motion is: 0-110 right, 0 to 105 left                        Crepitus is  present  Ankle:  Range of motion is normal. Strength and Tone:  The bilateral lower extremity has normal strength and  tone. Stability:  Knee:  The knee is stable.  Ankle:  The ankle is stable.   All other systems reviewed and are negative   The patient has been educated about the nature of the problem(s) and counseled on treatment options.  The patient appeared to understand what I have discussed and is in agreement with it.  Encounter Diagnoses  Name  Primary?  . Chronic pain of left knee Yes  . Chronic pain of right knee   . Mental handicap   . Essential hypertension, benign     PLAN Call if any problems.  Precautions discussed.  Continue current medications.   Return to clinic 3 months  Forms for rest home completed.  Electronically Signed Sanjuana Kava, MD 5/28/20199:59 AM

## 2017-09-03 ENCOUNTER — Ambulatory Visit (HOSPITAL_COMMUNITY)
Admission: RE | Admit: 2017-09-03 | Discharge: 2017-09-03 | Disposition: A | Discharge status: Home / Self Care | Payer: Medicare Other | Source: Ambulatory Visit | Attending: Pulmonary Disease | Admitting: Pulmonary Disease

## 2017-09-03 ENCOUNTER — Other Ambulatory Visit (HOSPITAL_COMMUNITY): Payer: Self-pay | Admitting: Pulmonary Disease

## 2017-09-03 DIAGNOSIS — J9811 Atelectasis: Secondary | ICD-10-CM | POA: Diagnosis not present

## 2017-09-03 DIAGNOSIS — M438X4 Other specified deforming dorsopathies, thoracic region: Secondary | ICD-10-CM | POA: Diagnosis not present

## 2017-09-03 DIAGNOSIS — J42 Unspecified chronic bronchitis: Secondary | ICD-10-CM

## 2017-09-03 DIAGNOSIS — I517 Cardiomegaly: Secondary | ICD-10-CM | POA: Insufficient documentation

## 2017-09-03 DIAGNOSIS — J449 Chronic obstructive pulmonary disease, unspecified: Secondary | ICD-10-CM | POA: Insufficient documentation

## 2017-09-07 ENCOUNTER — Other Ambulatory Visit (HOSPITAL_BASED_OUTPATIENT_CLINIC_OR_DEPARTMENT_OTHER): Payer: Self-pay

## 2017-09-07 DIAGNOSIS — G4730 Sleep apnea, unspecified: Secondary | ICD-10-CM

## 2017-09-07 DIAGNOSIS — R0683 Snoring: Secondary | ICD-10-CM

## 2017-09-07 DIAGNOSIS — G4710 Hypersomnia, unspecified: Secondary | ICD-10-CM

## 2017-09-17 ENCOUNTER — Ambulatory Visit: Payer: Medicare Other | Attending: Pulmonary Disease | Admitting: Neurology

## 2017-09-17 DIAGNOSIS — G4710 Hypersomnia, unspecified: Secondary | ICD-10-CM | POA: Insufficient documentation

## 2017-09-17 DIAGNOSIS — Z7982 Long term (current) use of aspirin: Secondary | ICD-10-CM | POA: Insufficient documentation

## 2017-09-17 DIAGNOSIS — Z794 Long term (current) use of insulin: Secondary | ICD-10-CM | POA: Diagnosis not present

## 2017-09-17 DIAGNOSIS — R0683 Snoring: Secondary | ICD-10-CM | POA: Insufficient documentation

## 2017-09-17 DIAGNOSIS — G4730 Sleep apnea, unspecified: Secondary | ICD-10-CM

## 2017-09-17 DIAGNOSIS — G4733 Obstructive sleep apnea (adult) (pediatric): Secondary | ICD-10-CM | POA: Diagnosis not present

## 2017-09-20 ENCOUNTER — Other Ambulatory Visit (HOSPITAL_BASED_OUTPATIENT_CLINIC_OR_DEPARTMENT_OTHER): Payer: Self-pay

## 2017-09-20 DIAGNOSIS — R0683 Snoring: Secondary | ICD-10-CM

## 2017-09-20 DIAGNOSIS — G4730 Sleep apnea, unspecified: Secondary | ICD-10-CM

## 2017-09-20 DIAGNOSIS — G4710 Hypersomnia, unspecified: Secondary | ICD-10-CM

## 2017-09-21 DIAGNOSIS — M79674 Pain in right toe(s): Secondary | ICD-10-CM | POA: Diagnosis not present

## 2017-09-21 DIAGNOSIS — M79675 Pain in left toe(s): Secondary | ICD-10-CM | POA: Diagnosis not present

## 2017-09-21 DIAGNOSIS — B351 Tinea unguium: Secondary | ICD-10-CM | POA: Diagnosis not present

## 2017-10-03 NOTE — Procedures (Signed)
Nanakuli A. Merlene Laughter, MD     [email or web address]             NOCTURNAL POLYSOMNOGRAPHY   LOCATION: [facility]  Patient Name: [name], [name] Study Date: 09/17/2017 Gender: Female D.O.B: 1956/01/15 Age (years): 61 Referring Provider: Sinda Du Height (inches): 65 Interpreting Physician: Phillips Odor MD, ABSM Weight (lbs): 263 RPSGT: Peak, Robert BMI: 44 MRN: [id number] Neck Size: 18.00 CLINICAL INFORMATION The patient is referred for a split night study with BPAP.  MEDICATIONS Medications self-administered by patient taken the night of the study : N/A  Current Outpatient Medications:  .  ACCU-CHEK AVIVA PLUS test strip, USE TO TEST FOUR TIMES DAILY., Disp: 150 each, Rfl: 5 .  ACCU-CHEK SOFTCLIX LANCETS lancets, USE TO TEST FOUR TIMES DAILY., Disp: 150 each, Rfl: 5 .  aspirin 325 MG tablet, Take 325 mg by mouth daily., Disp: , Rfl:  .  Blood Glucose Monitoring Suppl (ACCU-CHEK AVIVA) device, Use as instructed qid. e11.65, Disp: 1 each, Rfl: 0 .  budesonide-formoterol (SYMBICORT) 160-4.5 MCG/ACT inhaler, Inhale 2 puffs into the lungs 2 (two) times daily., Disp: , Rfl:  .  Choline Fenofibrate (FENOFIBRIC ACID) 135 MG CPDR, Take 1 capsule by mouth daily., Disp: , Rfl:  .  furosemide (LASIX) 40 MG tablet, Take 40 mg by mouth daily., Disp: , Rfl:  .  hydrALAZINE (APRESOLINE) 25 MG tablet, Take 25 mg by mouth 3 (three) times daily., Disp: , Rfl:  .  insulin regular human CONCENTRATED (HUMULIN R U-500 KWIKPEN) 500 UNIT/ML kwikpen, Inject 50 units with breakfast, 50 units with lunch, and 50 units with supper when blood glucose before food is greater than 90 mg/dL, Disp: 12 mL, Rfl: 2 .  isosorbide dinitrate (ISORDIL) 20 MG tablet, Take 20 mg by mouth 3 (three) times daily., Disp: , Rfl:  .  isosorbide-hydrALAZINE (BIDIL) 20-37.5 MG per tablet, Take 1 tablet by mouth 3 (three) times daily., Disp: , Rfl:  .  lisinopril (PRINIVIL,ZESTRIL) 20 MG tablet, Take 20 mg  by mouth daily., Disp: , Rfl:  .  loratadine (CLARITIN) 10 MG tablet, Take 10 mg by mouth daily., Disp: , Rfl:  .  Multiple Vitamin (MULTIVITAMIN) capsule, Take 1 capsule by mouth daily., Disp: , Rfl:  .  naproxen (NAPROSYN) 500 MG tablet, Take 1 tablet (500 mg total) by mouth 2 (two) times daily with a meal., Disp: 60 tablet, Rfl: 5 .  NOVOFINE AUTOCOVER 30G X 8 MM MISC, USE AS DIRECTED FOR INSULIN ADMINISTRATION., Disp: 100 each, Rfl: 3 .  SURE COMFORT PEN NEEDLES 31G X 8 MM MISC, USE AS DIRECTED WITH LEVEMIR AND NOVOLOG. UP TO FOUR TIMES DAILY., Disp: 100 each, Rfl: 5   SLEEP STUDY TECHNIQUE As per the AASM Manual for the Scoring of Sleep and Associated Events v2.3 (April 2016) with a hypopnea requiring 4% desaturations.  The channels recorded and monitored were frontal, central and occipital EEG, electrooculogram (EOG), submentalis EMG (chin), nasal and oral airflow, thoracic and abdominal wall motion, anterior tibialis EMG, snore microphone, electrocardiogram, and pulse oximetry. Bi-level positive airway pressure (BiPAP) was initiated when the patient met split night criteria and was titrated according to treat sleep-disordered breathing.  RESPIRATORY PARAMETERS Diagnostic  Total AHI (/hr): 125.0 RDI (/hr): 133.9 OA Index (/hr): 18.9 CA Index (/hr): 0.0 REM AHI (/hr): N/A NREM AHI (/hr): 125.0 Supine AHI (/hr): 118.0 Non-supine AHI (/hr): 137.11 Min O2 Sat (%): 76.0 Mean O2 (%): 92.2 Time below 88% (min): 16.4   Titration  Optimal IPAP Pressure (  cm): 18 Optimal EPAP Pressure (cm): 12 AHI at Optimal Pressure (/hr): N/A Min O2 at Optimal Pressure (%): 67.0 Sleep % at Optimal (%): N/A Supine % at Optimal (%): N/A     SLEEP ARCHITECTURE The study was initiated at 10:01:30 PM and terminated at 5:10:06 AM. The total recorded time was 428.6 minutes. EEG confirmed total sleep time was 355.5 minutes yielding a sleep efficiency of 82.9%%. Sleep onset after lights out was 5.2 minutes with a REM  latency of 189.5 minutes. The patient spent 6.2%% of the night in stage N1 sleep, 42.2%% in stage N2 sleep, 17.2%% in stage N3 and 34.46% in REM. Wake after sleep onset (WASO) was 67.9 minutes. The Arousal Index was 38.6/hour.  LEG MOVEMENT DATA The total Periodic Limb Movements of Sleep (PLMS) were 0. The PLMS index was 0.0 .  CARDIAC DATA The 2 lead EKG demonstrated sinus rhythm. The mean heart rate was 100.0 beats per minute. Other EKG findings include: None.   IMPRESSIONS Severe obstructive sleep apnea occurred during the diagnostic portion of the study (AHI = 125.0 /hour). The optimal BIPAP selected for this patient are ( 18 / 12 cm of water).   Delano Metz, MD Diplomate, American Board of Sleep Medicine. ELECTRONICALLY SIGNED ON:  10/03/2017, 3:58 PM [place] PH: [phone]   FX: [phone] [place]

## 2017-10-22 ENCOUNTER — Other Ambulatory Visit (HOSPITAL_COMMUNITY): Payer: Self-pay | Admitting: Medical

## 2017-10-22 DIAGNOSIS — N183 Chronic kidney disease, stage 3 unspecified: Secondary | ICD-10-CM

## 2017-11-05 ENCOUNTER — Encounter: Payer: Self-pay | Admitting: "Endocrinology

## 2017-11-17 ENCOUNTER — Ambulatory Visit (HOSPITAL_COMMUNITY)
Admission: RE | Admit: 2017-11-17 | Discharge: 2017-11-17 | Disposition: A | Discharge status: Home / Self Care | Payer: Medicare Other | Source: Ambulatory Visit | Attending: Medical | Admitting: Medical

## 2017-11-17 DIAGNOSIS — N183 Chronic kidney disease, stage 3 unspecified: Secondary | ICD-10-CM

## 2017-11-19 LAB — COMPLETE METABOLIC PANEL WITH GFR
AG Ratio: 1.4 (calc) (ref 1.0–2.5)
ALT: 18 U/L (ref 6–29)
AST: 24 U/L (ref 10–35)
Albumin: 3.9 g/dL (ref 3.6–5.1)
Alkaline phosphatase (APISO): 63 U/L (ref 33–130)
BUN/Creatinine Ratio: 19 (calc) (ref 6–22)
BUN: 38 mg/dL — ABNORMAL HIGH (ref 7–25)
CO2: 25 mmol/L (ref 20–32)
Calcium: 9.2 mg/dL (ref 8.6–10.4)
Chloride: 102 mmol/L (ref 98–110)
Creat: 2.05 mg/dL — ABNORMAL HIGH (ref 0.50–0.99)
GFR, Est African American: 30 mL/min/{1.73_m2} — ABNORMAL LOW (ref >=60)
GFR, Est Non African American: 26 mL/min/{1.73_m2} — ABNORMAL LOW (ref >=60)
Globulin: 2.8 g/dL (calc) (ref 1.9–3.7)
Glucose, Bld: 178 mg/dL — ABNORMAL HIGH (ref 65–99)
Potassium: 5.7 mmol/L — ABNORMAL HIGH (ref 3.5–5.3)
Sodium: 139 mmol/L (ref 135–146)
Total Bilirubin: 0.4 mg/dL (ref 0.2–1.2)
Total Protein: 6.7 g/dL (ref 6.1–8.1)

## 2017-11-19 LAB — T4, FREE: Free T4: 1.4 ng/dL (ref 0.8–1.8)

## 2017-11-19 LAB — HEMOGLOBIN A1C
Hgb A1c MFr Bld: 6.2 % of total Hgb — ABNORMAL HIGH (ref <5.7)
Mean Plasma Glucose: 131 (calc)
eAG (mmol/L): 7.3 (calc)

## 2017-11-19 LAB — TSH: TSH: 1.03 mIU/L (ref 0.40–4.50)

## 2017-11-22 ENCOUNTER — Ambulatory Visit: Payer: Medicare Other | Admitting: "Endocrinology

## 2017-11-23 ENCOUNTER — Encounter: Payer: Self-pay | Admitting: "Endocrinology

## 2017-11-23 ENCOUNTER — Ambulatory Visit (INDEPENDENT_AMBULATORY_CARE_PROVIDER_SITE_OTHER): Payer: Medicare Other | Admitting: "Endocrinology

## 2017-11-23 VITALS — BP 109/67 | HR 97 | Ht 64.5 in | Wt 262.0 lb

## 2017-11-23 DIAGNOSIS — M79674 Pain in right toe(s): Secondary | ICD-10-CM | POA: Diagnosis not present

## 2017-11-23 DIAGNOSIS — M79675 Pain in left toe(s): Secondary | ICD-10-CM | POA: Diagnosis not present

## 2017-11-23 DIAGNOSIS — B351 Tinea unguium: Secondary | ICD-10-CM | POA: Diagnosis not present

## 2017-11-23 DIAGNOSIS — E1122 Type 2 diabetes mellitus with diabetic chronic kidney disease: Secondary | ICD-10-CM | POA: Diagnosis not present

## 2017-11-23 DIAGNOSIS — E782 Mixed hyperlipidemia: Secondary | ICD-10-CM | POA: Diagnosis not present

## 2017-11-23 DIAGNOSIS — Z794 Long term (current) use of insulin: Secondary | ICD-10-CM

## 2017-11-23 DIAGNOSIS — I10 Essential (primary) hypertension: Secondary | ICD-10-CM

## 2017-11-23 DIAGNOSIS — E875 Hyperkalemia: Secondary | ICD-10-CM

## 2017-11-23 DIAGNOSIS — N184 Chronic kidney disease, stage 4 (severe): Secondary | ICD-10-CM

## 2017-11-23 MED ORDER — INSULIN REGULAR HUMAN (CONC) 500 UNIT/ML ~~LOC~~ SOPN: 30.0000 [IU] | PEN_INJECTOR | Freq: Three times a day (TID) | SUBCUTANEOUS | 2 refills | Status: DC

## 2017-11-23 NOTE — Progress Notes (Signed)
Endocrinology follow-up note   Subjective:    Patient ID: [name], female    DOB: 1955-11-06, PCP Rosita Fire, MD   Past Medical History:  Diagnosis Date  . Arthritis   . Asthma   . CHF (congestive heart failure) ([place])   . Diabetes mellitus without complication ([place])   . Hypertension   . Iron deficiency anemia 10/16/2010  . Mental handicap 10/16/2010   Past Surgical History:  Procedure Laterality Date  . ABDOMINAL HYSTERECTOMY    . CESAREAN SECTION    . CHOLECYSTECTOMY    . COLONOSCOPY  08/2010   normal TI, sigmoid polyp (adenoma ). Next TCS due  08/2015,  . ESOPHAGOGASTRODUODENOSCOPY  08/2010   antral and duodenal erosions s/p bx (chronic gastritis, no h.pylori, no celiac dz ), hiatal hernia  . KNEE SURGERY     right knee @ 62 years of age  . LEFT AND RIGHT HEART CATHETERIZATION WITH CORONARY ANGIOGRAM N/A 09/21/2011   Procedure: LEFT AND RIGHT HEART CATHETERIZATION WITH CORONARY ANGIOGRAM;  Surgeon: Birdie Riddle, MD;  Location: [facility];  Service: Cardiovascular;  Laterality: N/A;   Social History   Socioeconomic History  . Marital status: Divorced    Spouse name: Not on file  . Number of children: 1  . Years of education: Not on file  . Highest education level: Not on file  Occupational History    Employer: UNEMPLOYED  Social Needs  . Financial resource strain: Not on file  . Food insecurity:    Worry: Not on file    Inability: Not on file  . Transportation needs:    Medical: Not on file    Non-medical: Not on file  Tobacco Use  . Smoking status: Former Smoker    Packs/day: 1.00    Years: 0.00    Pack years: 0.00    Types: Cigarettes    Last attempt to quit: 04/11/1996    Years since quitting: 21.6  . Smokeless tobacco: Never Used  . Tobacco comment: quit a couple year ago  Substance and Sexual Activity  . Alcohol use: No  . Drug use: No  . Sexual activity: Never  Lifestyle  . Physical activity:    Days per week: Not on file    Minutes  per session: Not on file  . Stress: Not on file  Relationships  . Social connections:    Talks on phone: Not on file    Gets together: Not on file    Attends religious service: Not on file    Active member of club or organization: Not on file    Attends meetings of clubs or organizations: Not on file    Relationship status: Not on file  Other Topics Concern  . Not on file  Social History Narrative   Lives with parents.    Outpatient Encounter Medications as of 11/23/2017  Medication Sig  . aspirin EC 81 MG tablet Take 81 mg by mouth daily.  . Choline Fenofibrate (FENOFIBRIC ACID) 135 MG CPDR Take 1 capsule by mouth daily.  . fluticasone (VERAMYST) 27.5 MCG/SPRAY nasal spray Place 2 sprays into the nose daily.  . Fluticasone-Umeclidin-Vilant (TRELEGY ELLIPTA) 100-62.5-25 MCG/INH AEPB Inhale into the lungs daily.  . furosemide (LASIX) 40 MG tablet Take 40 mg by mouth daily.  . hydrALAZINE (APRESOLINE) 25 MG tablet Take 25 mg by mouth 3 (three) times daily.  . insulin regular human CONCENTRATED (HUMULIN R U-500 KWIKPEN) 500 UNIT/ML kwikpen Inject 30 Units into the skin  3 (three) times daily with meals. Inject 50 units with breakfast, 50 units with lunch, and 50 units with supper when blood glucose before food is greater than 90 mg/dL  . isosorbide dinitrate (ISORDIL) 20 MG tablet Take 20 mg by mouth 3 (three) times daily.  Marland Kitchen lisinopril (PRINIVIL,ZESTRIL) 20 MG tablet Take 20 mg by mouth daily.  Marland Kitchen loratadine (CLARITIN) 10 MG tablet Take 10 mg by mouth daily.  . potassium chloride SA (K-DUR,KLOR-CON) 20 MEQ tablet daily.  . [DISCONTINUED] insulin regular human CONCENTRATED (HUMULIN R U-500 KWIKPEN) 500 UNIT/ML kwikpen Inject 50 units with breakfast, 50 units with lunch, and 50 units with supper when blood glucose before food is greater than 90 mg/dL (Patient taking differently: 30 Units 3 (three) times daily with meals. Inject 50 units with breakfast, 50 units with lunch, and 50 units with  supper when blood glucose before food is greater than 90 mg/dL)  . ACCU-CHEK AVIVA PLUS test strip USE TO TEST FOUR TIMES DAILY.  Marland Kitchen ACCU-CHEK SOFTCLIX LANCETS lancets USE TO TEST FOUR TIMES DAILY.  Marland Kitchen Blood Glucose Monitoring Suppl (ACCU-CHEK AVIVA) device Use as instructed qid. e11.65  . NOVOFINE AUTOCOVER 30G X 8 MM MISC USE AS DIRECTED FOR INSULIN ADMINISTRATION.  . SURE COMFORT PEN NEEDLES 31G X 8 MM MISC USE AS DIRECTED WITH LEVEMIR AND NOVOLOG. UP TO FOUR TIMES DAILY.  . [DISCONTINUED] aspirin 325 MG tablet Take 325 mg by mouth daily.  . [DISCONTINUED] budesonide-formoterol (SYMBICORT) 160-4.5 MCG/ACT inhaler Inhale 2 puffs into the lungs 2 (two) times daily.  . [DISCONTINUED] isosorbide-hydrALAZINE (BIDIL) 20-37.5 MG per tablet Take 1 tablet by mouth 3 (three) times daily.  . [DISCONTINUED] Multiple Vitamin (MULTIVITAMIN) capsule Take 1 capsule by mouth daily.  . [DISCONTINUED] naproxen (NAPROSYN) 500 MG tablet Take 1 tablet (500 mg total) by mouth 2 (two) times daily with a meal.   No facility-administered encounter medications on file as of 11/23/2017.    ALLERGIES: No Known Allergies VACCINATION STATUS: Immunization History  Administered Date(s) Administered  . Influenza,inj,Quad PF,6+ Mos 02/02/2013    Diabetes  She presents for her follow-up diabetic visit. She has type 2 diabetes mellitus. Onset time: she was diagnosed at approximate age of 38 years. Her disease course has been improving. There are no hypoglycemic associated symptoms. Pertinent negatives for hypoglycemia include no confusion, headaches, pallor or seizures. Associated symptoms include polydipsia and polyuria. Pertinent negatives for diabetes include no blurred vision, no chest pain, no fatigue and no polyphagia. There are no hypoglycemic complications. Symptoms are improving. Diabetic complications include nephropathy. Risk factors for coronary artery disease include diabetes mellitus, dyslipidemia, hypertension,  obesity and sedentary lifestyle. Current diabetic treatment includes intensive insulin program. Her weight is stable. She is following a generally unhealthy diet. She has had a previous visit with a dietitian. She never participates in exercise. Her home blood glucose trend is decreasing steadily. Her breakfast blood glucose range is generally 130-140 mg/dl. Her lunch blood glucose range is generally 140-180 mg/dl. Her dinner blood glucose range is generally 130-140 mg/dl. Her bedtime blood glucose range is generally 140-180 mg/dl. Her overall blood glucose range is 140-180 mg/dl. An ACE inhibitor/angiotensin II receptor blocker is being taken.  Hypertension  This is a chronic problem. The current episode started more than 1 year ago. The problem is uncontrolled. Pertinent negatives include no blurred vision, chest pain, headaches, palpitations or shortness of breath. Risk factors for coronary artery disease include diabetes mellitus, dyslipidemia, obesity and sedentary lifestyle. Past treatments include ACE inhibitors.  Hypertensive end-organ damage includes kidney disease. Identifiable causes of hypertension include chronic renal disease.  Hyperlipidemia  This is a chronic problem. The current episode started more than 1 year ago. The problem is uncontrolled. Exacerbating diseases include chronic renal disease, diabetes and obesity. Pertinent negatives include no chest pain, myalgias or shortness of breath. Current antihyperlipidemic treatment includes statins and fibric acid derivatives. Risk factors for coronary artery disease include diabetes mellitus, dyslipidemia, obesity, a sedentary lifestyle and hypertension.    Review of Systems  Constitutional: Positive for unexpected weight change. Negative for chills, fatigue and fever.  HENT: Negative for trouble swallowing and voice change.   Eyes: Negative for blurred vision and visual disturbance.  Respiratory: Negative for cough, shortness of breath and  wheezing.   Cardiovascular: Negative for chest pain, palpitations and leg swelling.  Gastrointestinal: Negative for diarrhea, nausea and vomiting.  Endocrine: Positive for polydipsia and polyuria. Negative for cold intolerance, heat intolerance and polyphagia.  Musculoskeletal: Negative for arthralgias and myalgias.  Skin: Negative for color change, pallor, rash and wound.  Neurological: Negative for seizures and headaches.  Psychiatric/Behavioral: Negative for confusion and suicidal ideas.    Objective:    BP 109/67   Pulse 97   Ht 5' 4.5" (1.638 m)   Wt 262 lb (118.8 kg)   BMI 44.28 kg/m   Wt Readings from Last 3 Encounters:  11/23/17 262 lb (118.8 kg)  08/24/17 263 lb (119.3 kg)  07/21/17 265 lb (120.2 kg)    Physical Exam  Constitutional: She is oriented to person, place, and time. She appears well-developed.  HENT:  Head: Normocephalic and atraumatic.  Eyes: EOM are normal.  Neck: Normal range of motion. Neck supple. No tracheal deviation present. No thyromegaly present.  Cardiovascular: Normal rate.  Pulmonary/Chest: Effort normal.  Abdominal: There is no tenderness. There is no guarding.  Musculoskeletal: Normal range of motion. She exhibits no edema.  Neurological: She is alert and oriented to person, place, and time. No cranial nerve deficit. Coordination normal.  Skin: Skin is warm and dry. No rash noted. No erythema. No pallor.  Psychiatric: She has a normal mood and affect. Judgment normal.    Results for orders placed or performed during the hospital encounter of 08/10/17  I-STAT creatinine  Result Value Ref Range   Creatinine, Ser 1.60 (H) 0.44 - 1.00 mg/dL   Complete Blood Count (Most recent): Lab Results  Component Value Date   WBC 8.4 02/02/2013   HGB 11.0 (L) 02/02/2013   HCT 32.7 (L) 02/02/2013   MCV 89.1 02/02/2013   PLT 254 02/02/2013   Chemistry (most recent): Lab Results  Component Value Date   NA 139 11/18/2017   K 5.7 (H) 11/18/2017    CL 102 11/18/2017   CO2 25 11/18/2017   BUN 38 (H) 11/18/2017   CREATININE 2.05 (H) 11/18/2017   Diabetic Labs (most recent): Lab Results  Component Value Date   HGBA1C 6.2 (H) 11/18/2017   HGBA1C 7.2 (H) 07/13/2017   HGBA1C 9.3 (H) 04/07/2017   Lipid Panel     Component Value Date/Time   CHOL 152 04/07/2017 0928   TRIG 244 (H) 04/07/2017 0928   HDL 38 (L) 04/07/2017 0928   CHOLHDL 4.0 04/07/2017 0928   LDLCALC 80 04/07/2017 0928    Assessment & Plan:   1. Type 2 diabetes mellitus with stage 4 chronic kidney disease, with long-term current use of insulin (HCC)  -Her diabetes is  complicated by CKD and patient remains at a high  risk for more acute and chronic complications of diabetes which include CAD, CVA, CKD, retinopathy, and neuropathy. These are all discussed in detail with the patient.  Patient is now in a nursing home, returns with continued improvement in her glucose profile.  No documented or reported major hypoglycemia.  She is more consistent getting her timely insulin injections this time.  - Her previsit labs show improved A1c of 6.2%, overall improving from 14.1%.   -  Glucose logs and insulin administration records pertaining to this visit,  to be scanned into patient's records.    - I have re-counseled the patient on diet management and weight loss  by adopting a carbohydrate restricted / protein rich  Diet.  -  Suggestion is made for her to avoid simple carbohydrates  from her diet including Cakes, Sweet Desserts / Pastries, Ice Cream, Soda (diet and regular), Sweet Tea, Candies, Chips, Cookies, Store Bought Juices, Alcohol in Excess of  1-2 drinks a day, Artificial Sweeteners, and "Sugar-free" Products. This will help patient to have stable blood glucose profile and potentially avoid unintended weight gain.  - Patient is advised to stick to a routine mealtimes to eat 3 meals  a day and avoid unnecessary snacks ( to snack only to correct hypoglycemia).  - I  have approached patient with the following individualized plan to manage diabetes and patient agrees.  -Her placement in group home is the best development for her lately.  -Her presenting glycemic profile is favorable.   -She will still require intensive insulin treatment to maintain control of diabetes with  Humulin R U500.    - I advised her to continueHumulin U500  at 30 units  with breakfast, 30 units with lunch, and 30 units before supper for pre-meal blood glucose above 90 associated with monitoring of blood glucose before meals and at bedtime. - She is advised to skip insulin if pre-meal blood glucose is below 90 mg/dL. -Due to advancing renal insufficiency, she is advised to discontinue metformin.    - Patient specific target  for A1c; LDL, HDL, Triglycerides, and  Waist Circumference were discussed in detail.  2) BP/HTN: Her blood pressure is controlled to target.  She is advised to restrict salt intake and I advised her to continue current medications including lisinopril 20 mg p.o. daily, hydralazine 25 mg p.o. 3 times daily, furosemide 40 mg p.o. Daily.  3) Lipids/HPL: Her recent lipid panel showed controlled  LDL of 80.  She is on fenofibrate's and advised to continue.   4)  Weight/Diet: CDE consult in progress, exercise, and carbohydrates information provided.  5) hyperkalemia: Likely multifactorial including CKD.  She is currently on potassium chloride 20 mg daily.  I advised her to hold this supplement for 10 days and repeat CMP.  6) Chronic Care/Health Maintenance:  -Patient is  on ACEI and encouraged to continue to follow up with Ophthalmology, Podiatrist at least yearly or according to recommendations, and advised to stay away from smoking. I have recommended yearly flu vaccine and pneumonia vaccination at least every 5 years;  and  sleep for at least 7 hours a day.    - I advised patient to maintain close follow up with Rosita Fire, MD for primary care needs.  - Time  spent with the patient: 25 min, of which >50% was spent in reviewing her blood glucose logs , discussing her hypo- and hyper-glycemic episodes, reviewing her current and  previous labs and insulin doses and developing a plan to avoid hypo-  and hyper-glycemia. Please refer to Patient Instructions for Blood Glucose Monitoring and Insulin/Medications Dosing Guide"  in media tab for additional information. [name] participated in the discussions, expressed understanding, and voiced agreement with the above plans.  All questions were answered to her satisfaction. she is encouraged to contact clinic should she have any questions or concerns prior to her return visit.  Follow up plan: -Return in about 3 months (around 02/23/2018) for Meter, and Logs, separate labs in 10 days.Glade Lloyd, MD Phone: [phone]  Fax: [phone]  This note was partially dictated with voice recognition software. Similar sounding words can be transcribed inadequately or may not  be corrected upon review.  11/23/2017, 1:14 PM

## 2017-11-23 NOTE — Patient Instructions (Signed)

## 2017-11-24 ENCOUNTER — Encounter: Payer: Self-pay | Admitting: Orthopaedic Surgery

## 2017-11-24 ENCOUNTER — Ambulatory Visit (INDEPENDENT_AMBULATORY_CARE_PROVIDER_SITE_OTHER): Payer: Medicare Other | Admitting: Orthopaedic Surgery

## 2017-11-24 VITALS — BP 108/52 | HR 90 | Ht 60.0 in | Wt 260.0 lb

## 2017-11-24 DIAGNOSIS — F79 Unspecified intellectual disabilities: Secondary | ICD-10-CM

## 2017-11-24 DIAGNOSIS — Z6841 Body Mass Index (BMI) 40.0 and over, adult: Secondary | ICD-10-CM

## 2017-11-24 DIAGNOSIS — G8929 Other chronic pain: Secondary | ICD-10-CM

## 2017-11-24 DIAGNOSIS — M25562 Pain in left knee: Secondary | ICD-10-CM | POA: Diagnosis not present

## 2017-11-24 DIAGNOSIS — M25561 Pain in right knee: Secondary | ICD-10-CM | POA: Diagnosis not present

## 2017-11-24 NOTE — Progress Notes (Signed)
Patient [name], female DOB:[birth year], 62 y.o. URK:[id number]  Chief Complaint  Patient presents with  . Joint Swelling    Bilateral knee pain.    HPI  [name] is a 62 y.o. female who has bilateral knee pain.  She has swelling and popping but no giving way.  She has no new trauma.  She is a resident at a local rest home.  I have their records.   Body mass index is 50.78 kg/m.   The patient meets the AMA guidelines for Morbid (severe) obesity with a BMI > 40.0 and I have recommended weight loss.  ROS  Review of Systems  All other systems reviewed and are negative.  The following is a summary of the past history medically, past history surgically, known current medicines, social history and family history.  This information is gathered electronically by the computer from prior information and documentation.  I review this each visit and have found including this information at this point in the chart is beneficial and informative.    Past Medical History:  Diagnosis Date  . Arthritis   . Asthma   . CHF (congestive heart failure) ([place])   . Diabetes mellitus without complication ([place])   . Hypertension   . Iron deficiency anemia 10/16/2010  . Mental handicap 10/16/2010    Past Surgical History:  Procedure Laterality Date  . ABDOMINAL HYSTERECTOMY    . CESAREAN SECTION    . CHOLECYSTECTOMY    . COLONOSCOPY  08/2010   normal TI, sigmoid polyp (adenoma ). Next TCS due  08/2015,  . ESOPHAGOGASTRODUODENOSCOPY  08/2010   antral and duodenal erosions s/p bx (chronic gastritis, no h.pylori, no celiac dz ), hiatal hernia  . KNEE SURGERY     right knee @ 62 years of age  . LEFT AND RIGHT HEART CATHETERIZATION WITH CORONARY ANGIOGRAM N/A 09/21/2011   Procedure: LEFT AND RIGHT HEART CATHETERIZATION WITH CORONARY ANGIOGRAM;  Surgeon: Birdie Riddle, MD;  Location: [facility];  Service: Cardiovascular;  Laterality: N/A;    Family History  Problem Relation Age of Onset   . Diabetes Mother   . Hypertension Mother   . Hypertension Father   . Kidney disease Unknown        son reportedly has had cyst on kidney and lung s/p surgery at [facility]  . Hypertension Son   . Colon cancer Neg Hx     Social History Social History   Tobacco Use  . Smoking status: Former Smoker    Packs/day: 1.00    Years: 0.00    Pack years: 0.00    Types: Cigarettes    Last attempt to quit: 04/11/1996    Years since quitting: 21.6  . Smokeless tobacco: Never Used  . Tobacco comment: quit a couple year ago  Substance Use Topics  . Alcohol use: No  . Drug use: No    No Known Allergies  Current Outpatient Medications  Medication Sig Dispense Refill  . ACCU-CHEK AVIVA PLUS test strip USE TO TEST FOUR TIMES DAILY. 150 each 5  . ACCU-CHEK SOFTCLIX LANCETS lancets USE TO TEST FOUR TIMES DAILY. 150 each 5  . aspirin EC 81 MG tablet Take 81 mg by mouth daily.    . Blood Glucose Monitoring Suppl (ACCU-CHEK AVIVA) device Use as instructed qid. e11.65 1 each 0  . Choline Fenofibrate (FENOFIBRIC ACID) 135 MG CPDR Take 1 capsule by mouth daily.    . fluticasone (VERAMYST) 27.5 MCG/SPRAY nasal spray Place 2  sprays into the nose daily.    . Fluticasone-Umeclidin-Vilant (TRELEGY ELLIPTA) 100-62.5-25 MCG/INH AEPB Inhale into the lungs daily.    . furosemide (LASIX) 40 MG tablet Take 40 mg by mouth daily.    . hydrALAZINE (APRESOLINE) 25 MG tablet Take 25 mg by mouth 3 (three) times daily.    . insulin regular human CONCENTRATED (HUMULIN R U-500 KWIKPEN) 500 UNIT/ML kwikpen Inject 30 Units into the skin 3 (three) times daily with meals. Inject 50 units with breakfast, 50 units with lunch, and 50 units with supper when blood glucose before food is greater than 90 mg/dL 4 pen 2  . isosorbide dinitrate (ISORDIL) 20 MG tablet Take 20 mg by mouth 3 (three) times daily.    Marland Kitchen lisinopril (PRINIVIL,ZESTRIL) 20 MG tablet Take 20 mg by mouth daily.    Marland Kitchen loratadine (CLARITIN) 10 MG tablet Take 10 mg by  mouth daily.    Marland Kitchen NOVOFINE AUTOCOVER 30G X 8 MM MISC USE AS DIRECTED FOR INSULIN ADMINISTRATION. 100 each 3  . potassium chloride SA (K-DUR,KLOR-CON) 20 MEQ tablet daily.    . SURE COMFORT PEN NEEDLES 31G X 8 MM MISC USE AS DIRECTED WITH LEVEMIR AND NOVOLOG. UP TO FOUR TIMES DAILY. 100 each 5   No current facility-administered medications for this visit.      Physical Exam  Blood pressure (!) 108/52, pulse 90, height 5' (1.524 m), weight 260 lb (117.9 kg).  Constitutional: overall normal hygiene, normal nutrition, well developed, normal grooming, normal body habitus. Assistive device:none  Musculoskeletal: gait and station Limp right, muscle tone and strength are normal, no tremors or atrophy is present.  .  Neurological: coordination overall normal.  Deep tendon reflex/nerve stretch intact.  Sensation normal.  Cranial nerves II-XII intact.   Skin:   Normal overall no scars, lesions, ulcers or rashes. No psoriasis.  Psychiatric: Alert and oriented x 3.  Recent memory intact, remote memory unclear.  Normal mood and affect. Well groomed.  Good eye contact.  Cardiovascular: overall no swelling, no varicosities, no edema bilaterally, normal temperatures of the legs and arms, no clubbing, cyanosis and good capillary refill.  Lymphatic: palpation is normal.  Both knees have effusion 1+, crepitus is present bilaterally but she has little pain. NV intact.  ROM is 0 to 105 right and 0 to 110 left.  She has a slight limp to the right.  All other systems reviewed and are negative   The patient has been educated about the nature of the problem(s) and counseled on treatment options.  The patient appeared to understand what I have discussed and is in agreement with it.  Encounter Diagnoses  Name Primary?  . Chronic pain of left knee Yes  . Chronic pain of right knee   . Mental handicap   . Body mass index 50.0-59.9, adult ([place])   . Morbid obesity ([place])     PLAN Call if any problems.   Precautions discussed.  Continue current medications.   Return to clinic 3 months   Forms completed for the rest home.  Electronically Signed Sanjuana Kava, MD 8/28/20199:31 AM

## 2017-12-02 DIAGNOSIS — J449 Chronic obstructive pulmonary disease, unspecified: Secondary | ICD-10-CM | POA: Diagnosis not present

## 2017-12-02 DIAGNOSIS — I509 Heart failure, unspecified: Secondary | ICD-10-CM | POA: Diagnosis not present

## 2017-12-02 DIAGNOSIS — I10 Essential (primary) hypertension: Secondary | ICD-10-CM | POA: Diagnosis not present

## 2017-12-02 DIAGNOSIS — J9811 Atelectasis: Secondary | ICD-10-CM | POA: Diagnosis not present

## 2017-12-03 ENCOUNTER — Other Ambulatory Visit: Payer: Self-pay | Admitting: "Endocrinology

## 2017-12-06 DIAGNOSIS — E875 Hyperkalemia: Secondary | ICD-10-CM | POA: Diagnosis not present

## 2017-12-06 LAB — COMPLETE METABOLIC PANEL WITH GFR
AG Ratio: 1.2 (calc) (ref 1.0–2.5)
ALT: 14 U/L (ref 6–29)
AST: 17 U/L (ref 10–35)
Albumin: 3.6 g/dL (ref 3.6–5.1)
Alkaline phosphatase (APISO): 61 U/L (ref 33–130)
BUN/Creatinine Ratio: 19 (calc) (ref 6–22)
BUN: 39 mg/dL — ABNORMAL HIGH (ref 7–25)
CO2: 29 mmol/L (ref 20–32)
Calcium: 9.2 mg/dL (ref 8.6–10.4)
Chloride: 101 mmol/L (ref 98–110)
Creat: 2.04 mg/dL — ABNORMAL HIGH (ref 0.50–0.99)
GFR, Est African American: 30 mL/min/{1.73_m2} — ABNORMAL LOW (ref >=60)
GFR, Est Non African American: 26 mL/min/{1.73_m2} — ABNORMAL LOW (ref >=60)
Globulin: 3 g/dL (calc) (ref 1.9–3.7)
Glucose, Bld: 155 mg/dL — ABNORMAL HIGH (ref 65–139)
Potassium: 5 mmol/L (ref 3.5–5.3)
Sodium: 139 mmol/L (ref 135–146)
Total Bilirubin: 0.3 mg/dL (ref 0.2–1.2)
Total Protein: 6.6 g/dL (ref 6.1–8.1)

## 2017-12-29 DIAGNOSIS — Z23 Encounter for immunization: Secondary | ICD-10-CM | POA: Diagnosis not present

## 2017-12-31 DIAGNOSIS — Z1159 Encounter for screening for other viral diseases: Secondary | ICD-10-CM | POA: Diagnosis not present

## 2017-12-31 DIAGNOSIS — E559 Vitamin D deficiency, unspecified: Secondary | ICD-10-CM | POA: Diagnosis not present

## 2017-12-31 DIAGNOSIS — R809 Proteinuria, unspecified: Secondary | ICD-10-CM | POA: Diagnosis not present

## 2017-12-31 DIAGNOSIS — I10 Essential (primary) hypertension: Secondary | ICD-10-CM | POA: Diagnosis not present

## 2017-12-31 DIAGNOSIS — N183 Chronic kidney disease, stage 3 (moderate): Secondary | ICD-10-CM | POA: Diagnosis not present

## 2017-12-31 DIAGNOSIS — Z79899 Other long term (current) drug therapy: Secondary | ICD-10-CM | POA: Diagnosis not present

## 2017-12-31 DIAGNOSIS — D509 Iron deficiency anemia, unspecified: Secondary | ICD-10-CM | POA: Diagnosis not present

## 2018-01-06 DIAGNOSIS — H6120 Impacted cerumen, unspecified ear: Secondary | ICD-10-CM | POA: Diagnosis not present

## 2018-01-06 DIAGNOSIS — I10 Essential (primary) hypertension: Secondary | ICD-10-CM | POA: Diagnosis not present

## 2018-01-06 DIAGNOSIS — J329 Chronic sinusitis, unspecified: Secondary | ICD-10-CM | POA: Diagnosis not present

## 2018-01-06 DIAGNOSIS — J449 Chronic obstructive pulmonary disease, unspecified: Secondary | ICD-10-CM | POA: Diagnosis not present

## 2018-01-13 ENCOUNTER — Encounter (HOSPITAL_COMMUNITY): Payer: Self-pay

## 2018-01-13 ENCOUNTER — Encounter (HOSPITAL_COMMUNITY)
Admission: RE | Admit: 2018-01-13 | Discharge: 2018-01-13 | Disposition: A | Discharge status: Home / Self Care | Payer: Medicare Other | Source: Ambulatory Visit | Attending: Nephrology | Admitting: Nephrology

## 2018-01-13 DIAGNOSIS — D509 Iron deficiency anemia, unspecified: Secondary | ICD-10-CM | POA: Diagnosis not present

## 2018-01-13 MED ORDER — SODIUM CHLORIDE 0.9 % IV SOLN
510.0000 mg | INTRAVENOUS | Status: DC
  Administered 2018-01-13: 510 mg via INTRAVENOUS
  Filled 2018-01-13: qty 17

## 2018-01-13 MED ORDER — SODIUM CHLORIDE 0.9 % IV SOLN
INTRAVENOUS | Status: DC
  Administered 2018-01-13: 10:00:00 via INTRAVENOUS

## 2018-01-20 ENCOUNTER — Encounter (HOSPITAL_COMMUNITY)
Admission: RE | Admit: 2018-01-20 | Discharge: 2018-01-20 | Disposition: A | Discharge status: Home / Self Care | Payer: Medicare Other | Source: Ambulatory Visit | Attending: Nephrology | Admitting: Nephrology

## 2018-01-20 DIAGNOSIS — D509 Iron deficiency anemia, unspecified: Secondary | ICD-10-CM | POA: Diagnosis not present

## 2018-01-20 MED ORDER — SODIUM CHLORIDE 0.9 % IV SOLN
510.0000 mg | Freq: Once | INTRAVENOUS | Status: AC
  Administered 2018-01-20: 510 mg via INTRAVENOUS
  Filled 2018-01-20: qty 17

## 2018-01-20 MED ORDER — SODIUM CHLORIDE 0.9 % IV SOLN
Freq: Once | INTRAVENOUS | Status: AC
  Administered 2018-01-20: 13:00:00 via INTRAVENOUS

## 2018-02-16 DIAGNOSIS — H5213 Myopia, bilateral: Secondary | ICD-10-CM | POA: Diagnosis not present

## 2018-02-16 DIAGNOSIS — E1165 Type 2 diabetes mellitus with hyperglycemia: Secondary | ICD-10-CM | POA: Diagnosis not present

## 2018-02-16 DIAGNOSIS — E1122 Type 2 diabetes mellitus with diabetic chronic kidney disease: Secondary | ICD-10-CM | POA: Diagnosis not present

## 2018-02-16 DIAGNOSIS — N184 Chronic kidney disease, stage 4 (severe): Secondary | ICD-10-CM | POA: Diagnosis not present

## 2018-02-16 DIAGNOSIS — H524 Presbyopia: Secondary | ICD-10-CM | POA: Diagnosis not present

## 2018-02-16 DIAGNOSIS — H52223 Regular astigmatism, bilateral: Secondary | ICD-10-CM | POA: Diagnosis not present

## 2018-02-17 LAB — COMPLETE METABOLIC PANEL WITH GFR
AG Ratio: 1.4 (calc) (ref 1.0–2.5)
ALT: 17 U/L (ref 6–29)
AST: 20 U/L (ref 10–35)
Albumin: 3.8 g/dL (ref 3.6–5.1)
Alkaline phosphatase (APISO): 67 U/L (ref 33–130)
BUN/Creatinine Ratio: 19 (calc) (ref 6–22)
BUN: 29 mg/dL — ABNORMAL HIGH (ref 7–25)
CO2: 28 mmol/L (ref 20–32)
Calcium: 9.2 mg/dL (ref 8.6–10.4)
Chloride: 101 mmol/L (ref 98–110)
Creat: 1.5 mg/dL — ABNORMAL HIGH (ref 0.50–0.99)
GFR, Est African American: 43 mL/min/{1.73_m2} — ABNORMAL LOW (ref >=60)
GFR, Est Non African American: 37 mL/min/{1.73_m2} — ABNORMAL LOW (ref >=60)
Globulin: 2.8 g/dL (calc) (ref 1.9–3.7)
Glucose, Bld: 235 mg/dL — ABNORMAL HIGH (ref 65–99)
Potassium: 4.5 mmol/L (ref 3.5–5.3)
Sodium: 138 mmol/L (ref 135–146)
Total Bilirubin: 0.3 mg/dL (ref 0.2–1.2)
Total Protein: 6.6 g/dL (ref 6.1–8.1)

## 2018-02-17 LAB — HEMOGLOBIN A1C
Hgb A1c MFr Bld: 7.4 % of total Hgb — ABNORMAL HIGH (ref <5.7)
Mean Plasma Glucose: 166 (calc)
eAG (mmol/L): 9.2 (calc)

## 2018-02-23 ENCOUNTER — Ambulatory Visit (INDEPENDENT_AMBULATORY_CARE_PROVIDER_SITE_OTHER): Payer: Medicare Other | Admitting: "Endocrinology

## 2018-02-23 ENCOUNTER — Encounter: Payer: Self-pay | Admitting: "Endocrinology

## 2018-02-23 ENCOUNTER — Other Ambulatory Visit: Payer: Self-pay | Admitting: "Endocrinology

## 2018-02-23 ENCOUNTER — Ambulatory Visit: Payer: Medicare Other | Admitting: Orthopaedic Surgery

## 2018-02-23 VITALS — BP 166/92 | HR 89 | Ht 64.5 in | Wt 266.0 lb

## 2018-02-23 DIAGNOSIS — E1122 Type 2 diabetes mellitus with diabetic chronic kidney disease: Secondary | ICD-10-CM | POA: Diagnosis not present

## 2018-02-23 DIAGNOSIS — E782 Mixed hyperlipidemia: Secondary | ICD-10-CM | POA: Diagnosis not present

## 2018-02-23 DIAGNOSIS — N184 Chronic kidney disease, stage 4 (severe): Secondary | ICD-10-CM | POA: Diagnosis not present

## 2018-02-23 DIAGNOSIS — I10 Essential (primary) hypertension: Secondary | ICD-10-CM

## 2018-02-23 DIAGNOSIS — Z794 Long term (current) use of insulin: Secondary | ICD-10-CM

## 2018-02-23 MED ORDER — INSULIN REGULAR HUMAN (CONC) 500 UNIT/ML ~~LOC~~ SOPN: 40.0000 [IU] | PEN_INJECTOR | Freq: Three times a day (TID) | SUBCUTANEOUS | 2 refills | Status: DC

## 2018-02-23 NOTE — Progress Notes (Signed)
Endocrinology follow-up note   Subjective:    Patient ID: [name], female    DOB: 02-Nov-1955, PCP Rosita Fire, MD   Past Medical History:  Diagnosis Date  . Arthritis   . Asthma   . CHF (congestive heart failure) ([place])   . Diabetes mellitus without complication ([place])   . Hypertension   . Iron deficiency anemia 10/16/2010  . Mental handicap 10/16/2010   Past Surgical History:  Procedure Laterality Date  . ABDOMINAL HYSTERECTOMY    . CESAREAN SECTION    . CHOLECYSTECTOMY    . COLONOSCOPY  08/2010   normal TI, sigmoid polyp (adenoma ). Next TCS due  08/2015,  . ESOPHAGOGASTRODUODENOSCOPY  08/2010   antral and duodenal erosions s/p bx (chronic gastritis, no h.pylori, no celiac dz ), hiatal hernia  . KNEE SURGERY     right knee @ 63 years of age  . LEFT AND RIGHT HEART CATHETERIZATION WITH CORONARY ANGIOGRAM N/A 09/21/2011   Procedure: LEFT AND RIGHT HEART CATHETERIZATION WITH CORONARY ANGIOGRAM;  Surgeon: Birdie Riddle, MD;  Location: [facility];  Service: Cardiovascular;  Laterality: N/A;   Social History   Socioeconomic History  . Marital status: Divorced    Spouse name: Not on file  . Number of children: 1  . Years of education: Not on file  . Highest education level: Not on file  Occupational History    Employer: UNEMPLOYED  Social Needs  . Financial resource strain: Not on file  . Food insecurity:    Worry: Not on file    Inability: Not on file  . Transportation needs:    Medical: Not on file    Non-medical: Not on file  Tobacco Use  . Smoking status: Former Smoker    Packs/day: 1.00    Years: 0.00    Pack years: 0.00    Types: Cigarettes    Last attempt to quit: 04/11/1996    Years since quitting: 21.8  . Smokeless tobacco: Never Used  . Tobacco comment: quit a couple year ago  Substance and Sexual Activity  . Alcohol use: No  . Drug use: No  . Sexual activity: Never  Lifestyle  . Physical activity:    Days per week: Not on file    Minutes  per session: Not on file  . Stress: Not on file  Relationships  . Social connections:    Talks on phone: Not on file    Gets together: Not on file    Attends religious service: Not on file    Active member of club or organization: Not on file    Attends meetings of clubs or organizations: Not on file    Relationship status: Not on file  Other Topics Concern  . Not on file  Social History Narrative   Lives with parents.    Outpatient Encounter Medications as of 02/23/2018  Medication Sig  . ACCU-CHEK AVIVA PLUS test strip USE TO TEST FOUR TIMES DAILY.  Marland Kitchen ACCU-CHEK SOFTCLIX LANCETS lancets USE TO TEST FOUR TIMES DAILY.  Marland Kitchen aspirin EC 81 MG tablet Take 81 mg by mouth daily.  . Choline Fenofibrate (FENOFIBRIC ACID) 135 MG CPDR Take 1 capsule by mouth daily.  . fluticasone (VERAMYST) 27.5 MCG/SPRAY nasal spray Place 2 sprays into the nose daily.  . Fluticasone-Umeclidin-Vilant (TRELEGY ELLIPTA) 100-62.5-25 MCG/INH AEPB Inhale into the lungs daily.  . furosemide (LASIX) 40 MG tablet Take 40 mg by mouth daily.  . hydrALAZINE (APRESOLINE) 25 MG tablet Take 25 mg  by mouth 3 (three) times daily.  . insulin regular human CONCENTRATED (HUMULIN R U-500 KWIKPEN) 500 UNIT/ML kwikpen Inject 40 Units into the skin 3 (three) times daily with meals. Inject 40 units with breakfast, 40 units with lunch, and 40 units with supper when blood glucose before food is greater than 90 mg/dL  . isosorbide dinitrate (ISORDIL) 20 MG tablet Take 20 mg by mouth 3 (three) times daily.  Marland Kitchen lisinopril (PRINIVIL,ZESTRIL) 20 MG tablet Take 20 mg by mouth daily.  Marland Kitchen loratadine (CLARITIN) 10 MG tablet Take 10 mg by mouth daily.  Marland Kitchen NOVOFINE AUTOCOVER 30G X 8 MM MISC USE AS DIRECTED FOR INSULIN ADMINISTRATION.  Marland Kitchen potassium chloride SA (K-DUR,KLOR-CON) 20 MEQ tablet daily.  . SURE COMFORT PEN NEEDLES 31G X 8 MM MISC USE AS DIRECTED WITH LEVEMIR AND NOVOLOG. UP TO FOUR TIMES DAILY.  . [DISCONTINUED] insulin regular human  CONCENTRATED (HUMULIN R U-500 KWIKPEN) 500 UNIT/ML kwikpen Inject 30 Units into the skin 3 (three) times daily with meals. Inject 50 units with breakfast, 50 units with lunch, and 50 units with supper when blood glucose before food is greater than 90 mg/dL   No facility-administered encounter medications on file as of 02/23/2018.    ALLERGIES: No Known Allergies VACCINATION STATUS: Immunization History  Administered Date(s) Administered  . Influenza,inj,Quad PF,6+ Mos 02/02/2013    Diabetes  She presents for her follow-up diabetic visit. She has type 2 diabetes mellitus. Onset time: she was diagnosed at approximate age of 17 years. Her disease course has been worsening. There are no hypoglycemic associated symptoms. Pertinent negatives for hypoglycemia include no confusion, headaches, pallor or seizures. Associated symptoms include polydipsia and polyuria. Pertinent negatives for diabetes include no blurred vision, no chest pain, no fatigue and no polyphagia. There are no hypoglycemic complications. Symptoms are worsening. Diabetic complications include nephropathy. Risk factors for coronary artery disease include diabetes mellitus, dyslipidemia, hypertension, obesity and sedentary lifestyle. Current diabetic treatment includes intensive insulin program. Her weight is fluctuating minimally. She is following a generally unhealthy diet. She has had a previous visit with a dietitian. She never participates in exercise. Her home blood glucose trend is increasing steadily. Her breakfast blood glucose range is generally 180-200 mg/dl. Her lunch blood glucose range is generally 180-200 mg/dl. Her dinner blood glucose range is generally 180-200 mg/dl. Her bedtime blood glucose range is generally 180-200 mg/dl. Her overall blood glucose range is 180-200 mg/dl. An ACE inhibitor/angiotensin II receptor blocker is being taken.  Hypertension  This is a chronic problem. The current episode started more than 1 year  ago. The problem is uncontrolled. Pertinent negatives include no blurred vision, chest pain, headaches, palpitations or shortness of breath. Risk factors for coronary artery disease include diabetes mellitus, dyslipidemia, obesity and sedentary lifestyle. Past treatments include ACE inhibitors. Hypertensive end-organ damage includes kidney disease. Identifiable causes of hypertension include chronic renal disease.  Hyperlipidemia  This is a chronic problem. The current episode started more than 1 year ago. The problem is uncontrolled. Exacerbating diseases include chronic renal disease, diabetes and obesity. Pertinent negatives include no chest pain, myalgias or shortness of breath. Current antihyperlipidemic treatment includes statins and fibric acid derivatives. Risk factors for coronary artery disease include diabetes mellitus, dyslipidemia, obesity, a sedentary lifestyle and hypertension.    Review of Systems  Constitutional: Positive for unexpected weight change. Negative for chills, fatigue and fever.  HENT: Negative for trouble swallowing and voice change.   Eyes: Negative for blurred vision and visual disturbance.  Respiratory: Negative  for cough, shortness of breath and wheezing.   Cardiovascular: Negative for chest pain, palpitations and leg swelling.  Gastrointestinal: Negative for diarrhea, nausea and vomiting.  Endocrine: Positive for polydipsia and polyuria. Negative for cold intolerance, heat intolerance and polyphagia.  Musculoskeletal: Negative for arthralgias and myalgias.  Skin: Negative for color change, pallor, rash and wound.  Neurological: Negative for seizures and headaches.  Psychiatric/Behavioral: Negative for confusion and suicidal ideas.    Objective:    BP (!) 166/92   Pulse 89   Ht 5' 4.5" (1.638 m)   Wt 266 lb (120.7 kg)   BMI 44.95 kg/m   Wt Readings from Last 3 Encounters:  02/23/18 266 lb (120.7 kg)  01/13/18 259 lb 14.8 oz (117.9 kg)  11/24/17 260 lb  (117.9 kg)    Physical Exam  Constitutional: She is oriented to person, place, and time. She appears well-developed.  HENT:  Head: Normocephalic and atraumatic.  Eyes: EOM are normal.  Neck: Normal range of motion. Neck supple. No tracheal deviation present. No thyromegaly present.  Cardiovascular: Normal rate.  Pulmonary/Chest: Effort normal.  Abdominal: There is no tenderness. There is no guarding.  Musculoskeletal: Normal range of motion. She exhibits no edema.  Neurological: She is alert and oriented to person, place, and time. No cranial nerve deficit. Coordination normal.  Skin: Skin is warm and dry. No rash noted. No erythema. No pallor.  Psychiatric: She has a normal mood and affect. Judgment normal.    Results for orders placed or performed in visit on 11/23/17  Hemoglobin A1c  Result Value Ref Range   Hgb A1c MFr Bld 7.4 (H) <5.7 % of total Hgb   Mean Plasma Glucose 166 (calc)   eAG (mmol/L) 9.2 (calc)  COMPLETE METABOLIC PANEL WITH GFR  Result Value Ref Range   Glucose, Bld 235 (H) 65 - 99 mg/dL   BUN 29 (H) 7 - 25 mg/dL   Creat 1.50 (H) 0.50 - 0.99 mg/dL   GFR, Est Non African American 37 (L) > OR = 60 mL/min/1.61m2   GFR, Est African American 43 (L) > OR = 60 mL/min/1.5m2   BUN/Creatinine Ratio 19 6 - 22 (calc)   Sodium 138 135 - 146 mmol/L   Potassium 4.5 3.5 - 5.3 mmol/L   Chloride 101 98 - 110 mmol/L   CO2 28 20 - 32 mmol/L   Calcium 9.2 8.6 - 10.4 mg/dL   Total Protein 6.6 6.1 - 8.1 g/dL   Albumin 3.8 3.6 - 5.1 g/dL   Globulin 2.8 1.9 - 3.7 g/dL (calc)   AG Ratio 1.4 1.0 - 2.5 (calc)   Total Bilirubin 0.3 0.2 - 1.2 mg/dL   Alkaline phosphatase (APISO) 67 33 - 130 U/L   AST 20 10 - 35 U/L   ALT 17 6 - 29 U/L  COMPLETE METABOLIC PANEL WITH GFR  Result Value Ref Range   Glucose, Bld 155 (H) 65 - 139 mg/dL   BUN 39 (H) 7 - 25 mg/dL   Creat 2.04 (H) 0.50 - 0.99 mg/dL   GFR, Est Non African American 26 (L) > OR = 60 mL/min/1.23m2   GFR, Est African  American 30 (L) > OR = 60 mL/min/1.60m2   BUN/Creatinine Ratio 19 6 - 22 (calc)   Sodium 139 135 - 146 mmol/L   Potassium 5.0 3.5 - 5.3 mmol/L   Chloride 101 98 - 110 mmol/L   CO2 29 20 - 32 mmol/L   Calcium 9.2 8.6 - 10.4 mg/dL  Total Protein 6.6 6.1 - 8.1 g/dL   Albumin 3.6 3.6 - 5.1 g/dL   Globulin 3.0 1.9 - 3.7 g/dL (calc)   AG Ratio 1.2 1.0 - 2.5 (calc)   Total Bilirubin 0.3 0.2 - 1.2 mg/dL   Alkaline phosphatase (APISO) 61 33 - 130 U/L   AST 17 10 - 35 U/L   ALT 14 6 - 29 U/L   Complete Blood Count (Most recent): Lab Results  Component Value Date   WBC 8.4 02/02/2013   HGB 11.0 (L) 02/02/2013   HCT 32.7 (L) 02/02/2013   MCV 89.1 02/02/2013   PLT 254 02/02/2013   Chemistry (most recent): Diabetic Labs (most recent): Lab Results  Component Value Date   HGBA1C 7.4 (H) 02/16/2018   HGBA1C 6.2 (H) 11/18/2017   HGBA1C 7.2 (H) 07/13/2017   Lipid Panel     Component Value Date/Time   CHOL 152 04/07/2017 0928   TRIG 244 (H) 04/07/2017 0928   HDL 38 (L) 04/07/2017 0928   CHOLHDL 4.0 04/07/2017 0928   LDLCALC 80 04/07/2017 0928    Assessment & Plan:   1. Type 2 diabetes mellitus with stage 4 chronic kidney disease, with long-term current use of insulin (HCC)  -Her diabetes is  complicated by CKD, obesity/sedentary life, and patient remains at extremely high risk for more acute and chronic complications of diabetes which include CAD, CVA, CKD, retinopathy, and neuropathy. These are all discussed in detail with the patient.  Patient is now in a nursing home, returns alone today with her logs showing significantly higher than target glycemic profile.  Her previsit A1c is 7.4% increasing from 6.2%.   -  Glucose logs and insulin administration records pertaining to this visit,  to be scanned into patient's records.    - I have re-counseled the patient on diet management and weight loss  by adopting a carbohydrate restricted / protein rich  Diet.  -  Suggestion is made for  her to avoid simple carbohydrates  from her diet including Cakes, Sweet Desserts / Pastries, Ice Cream, Soda (diet and regular), Sweet Tea, Candies, Chips, Cookies, Store Bought Juices, Alcohol in Excess of  1-2 drinks a day, Artificial Sweeteners, and "Sugar-free" Products. This will help patient to have stable blood glucose profile and potentially avoid unintended weight gain.   - Patient is advised to stick to a routine mealtimes to eat 3 meals  a day and avoid unnecessary snacks ( to snack only to correct hypoglycemia).  - I have approached patient with the following individualized plan to manage diabetes and patient agrees.  -Her placement in group home is the best development for her lately.  -Her presenting glycemic profile is above favorable targets, will need adjustment in her insulin doses.    -She will remain on Humulin RU 500 insulin for simplicity reasons.   -She is advised to increase Humulin U500  to 40 units  with breakfast, 40 units with lunch, and 40 units before supper for pre-meal blood glucose above 90 associated with monitoring of blood glucose before meals and at bedtime. - She is advised to skip insulin if pre-meal blood glucose is below 90 mg/dL or if she is not eating. -She is not a candidate for metformin, SGLT2 inhibitors due to CKD.   - Patient specific target  for A1c; LDL, HDL, Triglycerides, and  Waist Circumference were discussed in detail.  2) BP/HTN: Her blood pressure is not controlled to target.   She is advised to restrict salt  intake and I advised her to continue current medications including lisinopril 20 mg p.o. daily, hydralazine 25 mg p.o. 3 times daily, furosemide 40 mg p.o. Daily.  3) Lipids/HPL: Her recent lipid panel showed controlled  LDL of 80.  She is on fenofibrate's and advised to continue.   4)  Weight/Diet: CDE consult in progress, exercise, and carbohydrates information provided.  5) hyperkalemia: Likely multifactorial including CKD.  She is  currently on potassium chloride 20 mg daily.  I advised her to hold this supplement for 10 days and repeat CMP.  6) Chronic Care/Health Maintenance:  -Patient is  on ACEI and encouraged to continue to follow up with Ophthalmology, Podiatrist at least yearly or according to recommendations, and advised to stay away from smoking. I have recommended yearly flu vaccine and pneumonia vaccination at least every 5 years;  and  sleep for at least 7 hours a day.    - I advised patient to maintain close follow up with Rosita Fire, MD for primary care needs.  - Time spent with the patient: 25 min, of which >50% was spent in reviewing her blood glucose logs , discussing her hypo- and hyper-glycemic episodes, reviewing her current and  previous labs and insulin doses and developing a plan to avoid hypo- and hyper-glycemia. Please refer to Patient Instructions for Blood Glucose Monitoring and Insulin/Medications Dosing Guide"  in media tab for additional information. [name] participated in the discussions, expressed understanding, and voiced agreement with the above plans.  All questions were answered to her satisfaction. she is encouraged to contact clinic should she have any questions or concerns prior to her return visit.   Follow up plan: -Return in about 3 months (around 05/26/2018) for Follow up with Pre-visit Labs, Meter, and Logs.  Glade Lloyd, MD Phone: [phone]  Fax: [phone]  This note was partially dictated with voice recognition software. Similar sounding words can be transcribed inadequately or may not  be corrected upon review.  02/23/2018, 12:54 PM

## 2018-02-23 NOTE — Patient Instructions (Signed)

## 2018-03-01 DIAGNOSIS — M79674 Pain in right toe(s): Secondary | ICD-10-CM | POA: Diagnosis not present

## 2018-03-01 DIAGNOSIS — M79675 Pain in left toe(s): Secondary | ICD-10-CM | POA: Diagnosis not present

## 2018-03-01 DIAGNOSIS — B351 Tinea unguium: Secondary | ICD-10-CM | POA: Diagnosis not present

## 2018-03-02 ENCOUNTER — Ambulatory Visit (INDEPENDENT_AMBULATORY_CARE_PROVIDER_SITE_OTHER): Payer: Medicare Other | Admitting: Orthopaedic Surgery

## 2018-03-02 ENCOUNTER — Encounter: Payer: Self-pay | Admitting: Orthopaedic Surgery

## 2018-03-02 VITALS — BP 148/76 | HR 73 | Ht 64.5 in | Wt 266.0 lb

## 2018-03-02 DIAGNOSIS — F79 Unspecified intellectual disabilities: Secondary | ICD-10-CM | POA: Diagnosis not present

## 2018-03-02 DIAGNOSIS — Z6841 Body Mass Index (BMI) 40.0 and over, adult: Secondary | ICD-10-CM

## 2018-03-02 DIAGNOSIS — M25561 Pain in right knee: Secondary | ICD-10-CM

## 2018-03-02 DIAGNOSIS — M25562 Pain in left knee: Secondary | ICD-10-CM | POA: Diagnosis not present

## 2018-03-02 DIAGNOSIS — G8929 Other chronic pain: Secondary | ICD-10-CM

## 2018-03-02 NOTE — Progress Notes (Signed)
Patient [name], female DOB:Jan 11, 1956, 62 y.o. XMI:[id number]  Chief Complaint  Patient presents with  . Knee Pain    HPI  [name] is a 62 y.o. female who has bilateral knee pain that is worse with the cold weather.  She has popping and swelling but no giving way.  She is taking her medicine.  She is resident at [facility].  She has no trauma.   Body mass index is 44.95 kg/m.  The patient meets the AMA guidelines for Morbid (severe) obesity with a BMI > 40.0 and I have recommended weight loss.   ROS  Review of Systems  HENT: Negative for congestion.   Respiratory: Positive for shortness of breath. Negative for cough.   Cardiovascular: Positive for leg swelling. Negative for chest pain.  Endocrine: Positive for cold intolerance.  Musculoskeletal: Positive for arthralgias, back pain, gait problem and joint swelling.  Allergic/Immunologic: Positive for environmental allergies.  All other systems reviewed and are negative.   All other systems reviewed and are negative.  The following is a summary of the past history medically, past history surgically, known current medicines, social history and family history.  This information is gathered electronically by the computer from prior information and documentation.  I review this each visit and have found including this information at this point in the chart is beneficial and informative.    Past Medical History:  Diagnosis Date  . Arthritis   . Asthma   . CHF (congestive heart failure) ([place])   . Diabetes mellitus without complication ([place])   . Hypertension   . Iron deficiency anemia 10/16/2010  . Mental handicap 10/16/2010    Past Surgical History:  Procedure Laterality Date  . ABDOMINAL HYSTERECTOMY    . CESAREAN SECTION    . CHOLECYSTECTOMY    . COLONOSCOPY  08/2010   normal TI, sigmoid polyp (adenoma ). Next TCS due  08/2015,  . ESOPHAGOGASTRODUODENOSCOPY  08/2010   antral and duodenal  erosions s/p bx (chronic gastritis, no h.pylori, no celiac dz ), hiatal hernia  . KNEE SURGERY     right knee @ 62 years of age  . LEFT AND RIGHT HEART CATHETERIZATION WITH CORONARY ANGIOGRAM N/A 09/21/2011   Procedure: LEFT AND RIGHT HEART CATHETERIZATION WITH CORONARY ANGIOGRAM;  Surgeon: Birdie Riddle, MD;  Location: [facility];  Service: Cardiovascular;  Laterality: N/A;    Family History  Problem Relation Age of Onset  . Diabetes Mother   . Hypertension Mother   . Hypertension Father   . Kidney disease Unknown        son reportedly has had cyst on kidney and lung s/p surgery at [facility]  . Hypertension Son   . Colon cancer Neg Hx     Social History Social History   Tobacco Use  . Smoking status: Former Smoker    Packs/day: 1.00    Years: 0.00    Pack years: 0.00    Types: Cigarettes    Last attempt to quit: 04/11/1996    Years since quitting: 21.9  . Smokeless tobacco: Never Used  . Tobacco comment: quit a couple year ago  Substance Use Topics  . Alcohol use: No  . Drug use: No    No Known Allergies  Current Outpatient Medications  Medication Sig Dispense Refill  . ACCU-CHEK AVIVA PLUS test strip USE TO TEST THREE TIMES DAILY. 100 each 5  . ACCU-CHEK SOFTCLIX LANCETS lancets USE TO TEST THREE TIMES DAILY. 100 each  5  . aspirin EC 81 MG tablet Take 81 mg by mouth daily.    . Choline Fenofibrate (FENOFIBRIC ACID) 135 MG CPDR Take 1 capsule by mouth daily.    . fluticasone (VERAMYST) 27.5 MCG/SPRAY nasal spray Place 2 sprays into the nose daily.    . Fluticasone-Umeclidin-Vilant (TRELEGY ELLIPTA) 100-62.5-25 MCG/INH AEPB Inhale into the lungs daily.    . furosemide (LASIX) 40 MG tablet Take 40 mg by mouth daily.    . hydrALAZINE (APRESOLINE) 25 MG tablet Take 25 mg by mouth 3 (three) times daily.    . insulin regular human CONCENTRATED (HUMULIN R U-500 KWIKPEN) 500 UNIT/ML kwikpen Inject 40 Units into the skin 3 (three) times daily with meals. Inject 40 units with  breakfast, 40 units with lunch, and 40 units with supper when blood glucose before food is greater than 90 mg/dL 4 pen 2  . isosorbide dinitrate (ISORDIL) 20 MG tablet Take 20 mg by mouth 3 (three) times daily.    Marland Kitchen lisinopril (PRINIVIL,ZESTRIL) 20 MG tablet Take 20 mg by mouth daily.    Marland Kitchen loratadine (CLARITIN) 10 MG tablet Take 10 mg by mouth daily.    Marland Kitchen NOVOFINE AUTOCOVER 30G X 8 MM MISC USE AS DIRECTED FOR INSULIN ADMINISTRATION. 100 each 3  . potassium chloride SA (K-DUR,KLOR-CON) 20 MEQ tablet daily.    . SURE COMFORT PEN NEEDLES 31G X 8 MM MISC USE AS DIRECTED WITH LEVEMIR AND NOVOLOG. UP TO FOUR TIMES DAILY. 100 each 5   No current facility-administered medications for this visit.      Physical Exam  Blood pressure (!) 148/76, pulse 73, height 5' 4.5" (1.638 m), weight 266 lb (120.7 kg).  Constitutional: overall normal hygiene, normal nutrition, well developed, normal grooming, normal body habitus. Assistive device:none  Musculoskeletal: gait and station Limp none, muscle tone and strength are normal, no tremors or atrophy is present.  .  Neurological: coordination overall normal.  Deep tendon reflex/nerve stretch intact.  Sensation normal.  Cranial nerves II-XII intact.   Skin:   Normal overall no scars, lesions, ulcers or rashes. No psoriasis.  Psychiatric: Alert and oriented x 3.  Recent memory intact, remote memory unclear.  Normal mood and affect. Well groomed.  Good eye contact.  Cardiovascular: overall no swelling, no varicosities, no edema bilaterally, normal temperatures of the legs and arms, no clubbing, cyanosis and good capillary refill.  Lymphatic: palpation is normal.  The bilateral lower extremity is examined:  Inspection:  Thigh:  Non-tender and no defects  Knee has swelling 1+ effusion.                        Joint tenderness is present                        Patient is tender over the medial joint line  Lower Leg:  Has normal appearance and no tenderness  or defects  Ankle:  Non-tender and no defects  Foot:  Non-tender and no defects Range of Motion:  Knee:  Range of motion is: 0 to 105 right, 0 to 110 left                        Crepitus is  present  Ankle:  Range of motion is normal. Strength and Tone:  The bilateral lower extremity has normal strength and tone. Stability:  Knee:  The knee is stable.  Ankle:  The ankle  is stable.   All other systems reviewed and are negative   The patient has been educated about the nature of the problem(s) and counseled on treatment options.  The patient appeared to understand what I have discussed and is in agreement with it.  Encounter Diagnoses  Name Primary?  . Chronic pain of left knee Yes  . Chronic pain of right knee   . Mental handicap   . Body mass index 50.0-59.9, adult ([place])   . Morbid obesity ([place])     PLAN Call if any problems.  Precautions discussed.  Continue current medications.   Return to clinic 3 months   Forms for [facility] Completed.  Electronically Signed Sanjuana Kava, MD 12/4/201910:10 AM

## 2018-03-03 DIAGNOSIS — I2510 Atherosclerotic heart disease of native coronary artery without angina pectoris: Secondary | ICD-10-CM | POA: Diagnosis not present

## 2018-03-03 DIAGNOSIS — I10 Essential (primary) hypertension: Secondary | ICD-10-CM | POA: Diagnosis not present

## 2018-03-03 DIAGNOSIS — I509 Heart failure, unspecified: Secondary | ICD-10-CM | POA: Diagnosis not present

## 2018-03-03 DIAGNOSIS — J449 Chronic obstructive pulmonary disease, unspecified: Secondary | ICD-10-CM | POA: Diagnosis not present

## 2018-03-31 ENCOUNTER — Ambulatory Visit: Payer: Medicare Other | Admitting: Orthopaedic Surgery

## 2018-04-01 ENCOUNTER — Other Ambulatory Visit: Payer: Self-pay | Admitting: "Endocrinology

## 2018-04-07 DIAGNOSIS — I10 Essential (primary) hypertension: Secondary | ICD-10-CM | POA: Diagnosis not present

## 2018-04-07 DIAGNOSIS — E1165 Type 2 diabetes mellitus with hyperglycemia: Secondary | ICD-10-CM | POA: Diagnosis not present

## 2018-04-07 DIAGNOSIS — J329 Chronic sinusitis, unspecified: Secondary | ICD-10-CM | POA: Diagnosis not present

## 2018-04-21 DIAGNOSIS — R197 Diarrhea, unspecified: Secondary | ICD-10-CM | POA: Diagnosis not present

## 2018-04-21 DIAGNOSIS — I5032 Chronic diastolic (congestive) heart failure: Secondary | ICD-10-CM | POA: Diagnosis not present

## 2018-04-21 DIAGNOSIS — E1165 Type 2 diabetes mellitus with hyperglycemia: Secondary | ICD-10-CM | POA: Diagnosis not present

## 2018-04-21 DIAGNOSIS — J019 Acute sinusitis, unspecified: Secondary | ICD-10-CM | POA: Diagnosis not present

## 2018-05-03 DIAGNOSIS — M79674 Pain in right toe(s): Secondary | ICD-10-CM | POA: Diagnosis not present

## 2018-05-03 DIAGNOSIS — M79675 Pain in left toe(s): Secondary | ICD-10-CM | POA: Diagnosis not present

## 2018-05-03 DIAGNOSIS — B351 Tinea unguium: Secondary | ICD-10-CM | POA: Diagnosis not present

## 2018-05-12 DIAGNOSIS — J441 Chronic obstructive pulmonary disease with (acute) exacerbation: Secondary | ICD-10-CM | POA: Diagnosis not present

## 2018-05-12 DIAGNOSIS — I10 Essential (primary) hypertension: Secondary | ICD-10-CM | POA: Diagnosis not present

## 2018-05-12 DIAGNOSIS — J189 Pneumonia, unspecified organism: Secondary | ICD-10-CM | POA: Diagnosis not present

## 2018-05-12 DIAGNOSIS — J449 Chronic obstructive pulmonary disease, unspecified: Secondary | ICD-10-CM | POA: Diagnosis not present

## 2018-05-12 DIAGNOSIS — R05 Cough: Secondary | ICD-10-CM | POA: Diagnosis not present

## 2018-05-12 DIAGNOSIS — R197 Diarrhea, unspecified: Secondary | ICD-10-CM | POA: Diagnosis not present

## 2018-05-12 DIAGNOSIS — R918 Other nonspecific abnormal finding of lung field: Secondary | ICD-10-CM | POA: Diagnosis not present

## 2018-05-12 DIAGNOSIS — I5032 Chronic diastolic (congestive) heart failure: Secondary | ICD-10-CM | POA: Diagnosis not present

## 2018-05-13 ENCOUNTER — Emergency Department (HOSPITAL_COMMUNITY): Payer: Medicare Other

## 2018-05-13 ENCOUNTER — Inpatient Hospital Stay (HOSPITAL_COMMUNITY)
Admission: EM | Admit: 2018-05-13 | Discharge: 2018-05-16 | DRG: 194 | Payer: Medicare Other | Source: Skilled Nursing Facility | Attending: Internal Medicine | Admitting: Internal Medicine

## 2018-05-13 ENCOUNTER — Inpatient Hospital Stay (HOSPITAL_COMMUNITY): Payer: Medicare Other

## 2018-05-13 ENCOUNTER — Other Ambulatory Visit: Payer: Self-pay

## 2018-05-13 ENCOUNTER — Encounter (HOSPITAL_COMMUNITY): Payer: Self-pay | Admitting: Emergency Medicine

## 2018-05-13 DIAGNOSIS — E1165 Type 2 diabetes mellitus with hyperglycemia: Secondary | ICD-10-CM | POA: Diagnosis not present

## 2018-05-13 DIAGNOSIS — J45909 Unspecified asthma, uncomplicated: Secondary | ICD-10-CM | POA: Diagnosis not present

## 2018-05-13 DIAGNOSIS — J181 Lobar pneumonia, unspecified organism: Secondary | ICD-10-CM

## 2018-05-13 DIAGNOSIS — Z79899 Other long term (current) drug therapy: Secondary | ICD-10-CM | POA: Diagnosis not present

## 2018-05-13 DIAGNOSIS — I130 Hypertensive heart and chronic kidney disease with heart failure and stage 1 through stage 4 chronic kidney disease, or unspecified chronic kidney disease: Secondary | ICD-10-CM | POA: Diagnosis present

## 2018-05-13 DIAGNOSIS — I10 Essential (primary) hypertension: Secondary | ICD-10-CM

## 2018-05-13 DIAGNOSIS — N184 Chronic kidney disease, stage 4 (severe): Secondary | ICD-10-CM | POA: Diagnosis not present

## 2018-05-13 DIAGNOSIS — Z87891 Personal history of nicotine dependence: Secondary | ICD-10-CM | POA: Diagnosis not present

## 2018-05-13 DIAGNOSIS — I5032 Chronic diastolic (congestive) heart failure: Secondary | ICD-10-CM | POA: Diagnosis present

## 2018-05-13 DIAGNOSIS — Z833 Family history of diabetes mellitus: Secondary | ICD-10-CM

## 2018-05-13 DIAGNOSIS — Z90710 Acquired absence of both cervix and uterus: Secondary | ICD-10-CM | POA: Diagnosis not present

## 2018-05-13 DIAGNOSIS — Z23 Encounter for immunization: Secondary | ICD-10-CM | POA: Diagnosis not present

## 2018-05-13 DIAGNOSIS — E1122 Type 2 diabetes mellitus with diabetic chronic kidney disease: Secondary | ICD-10-CM | POA: Diagnosis present

## 2018-05-13 DIAGNOSIS — R05 Cough: Secondary | ICD-10-CM | POA: Diagnosis not present

## 2018-05-13 DIAGNOSIS — N189 Chronic kidney disease, unspecified: Secondary | ICD-10-CM | POA: Diagnosis not present

## 2018-05-13 DIAGNOSIS — Z7982 Long term (current) use of aspirin: Secondary | ICD-10-CM | POA: Diagnosis not present

## 2018-05-13 DIAGNOSIS — Z794 Long term (current) use of insulin: Secondary | ICD-10-CM | POA: Diagnosis not present

## 2018-05-13 DIAGNOSIS — N179 Acute kidney failure, unspecified: Secondary | ICD-10-CM | POA: Diagnosis not present

## 2018-05-13 DIAGNOSIS — Z8249 Family history of ischemic heart disease and other diseases of the circulatory system: Secondary | ICD-10-CM | POA: Diagnosis not present

## 2018-05-13 DIAGNOSIS — Z86010 Personal history of colonic polyps: Secondary | ICD-10-CM

## 2018-05-13 DIAGNOSIS — D631 Anemia in chronic kidney disease: Secondary | ICD-10-CM | POA: Diagnosis not present

## 2018-05-13 DIAGNOSIS — F79 Unspecified intellectual disabilities: Secondary | ICD-10-CM

## 2018-05-13 DIAGNOSIS — Z9049 Acquired absence of other specified parts of digestive tract: Secondary | ICD-10-CM

## 2018-05-13 DIAGNOSIS — R1110 Vomiting, unspecified: Secondary | ICD-10-CM | POA: Diagnosis not present

## 2018-05-13 DIAGNOSIS — Z8711 Personal history of peptic ulcer disease: Secondary | ICD-10-CM | POA: Diagnosis not present

## 2018-05-13 DIAGNOSIS — Z6841 Body Mass Index (BMI) 40.0 and over, adult: Secondary | ICD-10-CM | POA: Diagnosis not present

## 2018-05-13 DIAGNOSIS — J189 Pneumonia, unspecified organism: Secondary | ICD-10-CM | POA: Diagnosis not present

## 2018-05-13 DIAGNOSIS — R000 Tachycardia, unspecified: Secondary | ICD-10-CM | POA: Diagnosis not present

## 2018-05-13 LAB — CBC WITH DIFFERENTIAL/PLATELET
Abs Immature Granulocytes: 0.21 10*3/uL — ABNORMAL HIGH (ref 0.00–0.07)
Basophils Absolute: 0.1 10*3/uL (ref 0.0–0.1)
Basophils Relative: 0 %
Eosinophils Absolute: 0.1 10*3/uL (ref 0.0–0.5)
Eosinophils Relative: 1 %
HCT: 33.2 % — ABNORMAL LOW (ref 36.0–46.0)
Hemoglobin: 9.8 g/dL — ABNORMAL LOW (ref 12.0–15.0)
Immature Granulocytes: 1 %
Lymphocytes Relative: 5 %
Lymphs Abs: 0.8 10*3/uL (ref 0.7–4.0)
MCH: 27.8 pg (ref 26.0–34.0)
MCHC: 29.5 g/dL — ABNORMAL LOW (ref 30.0–36.0)
MCV: 94.3 fL (ref 80.0–100.0)
Monocytes Absolute: 1.4 10*3/uL — ABNORMAL HIGH (ref 0.1–1.0)
Monocytes Relative: 9 %
Neutro Abs: 12.4 10*3/uL — ABNORMAL HIGH (ref 1.7–7.7)
Neutrophils Relative %: 84 %
Platelets: 515 10*3/uL — ABNORMAL HIGH (ref 150–400)
RBC: 3.52 MIL/uL — ABNORMAL LOW (ref 3.87–5.11)
RDW: 13.7 % (ref 11.5–15.5)
WBC: 15 10*3/uL — ABNORMAL HIGH (ref 4.0–10.5)
nRBC: 0 % (ref 0.0–0.2)

## 2018-05-13 LAB — COMPREHENSIVE METABOLIC PANEL
ALT: 32 U/L (ref 0–44)
AST: 29 U/L (ref 15–41)
Albumin: 2.8 g/dL — ABNORMAL LOW (ref 3.5–5.0)
Alkaline Phosphatase: 93 U/L (ref 38–126)
Anion gap: 11 (ref 5–15)
BUN: 39 mg/dL — ABNORMAL HIGH (ref 8–23)
CO2: 23 mmol/L (ref 22–32)
Calcium: 9 mg/dL (ref 8.9–10.3)
Chloride: 103 mmol/L (ref 98–111)
Creatinine, Ser: 1.83 mg/dL — ABNORMAL HIGH (ref 0.44–1.00)
GFR calc Af Amer: 34 mL/min — ABNORMAL LOW (ref >60)
GFR calc non Af Amer: 29 mL/min — ABNORMAL LOW (ref >60)
Glucose, Bld: 75 mg/dL (ref 70–99)
Potassium: 4.6 mmol/L (ref 3.5–5.1)
Sodium: 137 mmol/L (ref 135–145)
Total Bilirubin: 0.6 mg/dL (ref 0.3–1.2)
Total Protein: 8.6 g/dL — ABNORMAL HIGH (ref 6.5–8.1)

## 2018-05-13 LAB — INFLUENZA PANEL BY PCR (TYPE A & B)
Influenza A By PCR: NEGATIVE
Influenza B By PCR: NEGATIVE

## 2018-05-13 LAB — PROCALCITONIN: Procalcitonin: 0.3 ng/mL

## 2018-05-13 LAB — MAGNESIUM: Magnesium: 2.3 mg/dL (ref 1.7–2.4)

## 2018-05-13 LAB — LIPASE, BLOOD: Lipase: 24 U/L (ref 11–51)

## 2018-05-13 LAB — LACTIC ACID, PLASMA: Lactic Acid, Venous: 0.7 mmol/L (ref 0.5–1.9)

## 2018-05-13 LAB — CBG MONITORING, ED: Glucose-Capillary: 173 mg/dL — ABNORMAL HIGH (ref 70–99)

## 2018-05-13 MED ORDER — FENOFIBRATE 160 MG PO TABS
160.0000 mg | ORAL_TABLET | Freq: Every day | ORAL | Status: DC
  Administered 2018-05-14 – 2018-05-16 (×3): 160 mg via ORAL
  Filled 2018-05-13 (×5): qty 1

## 2018-05-13 MED ORDER — ENOXAPARIN SODIUM 40 MG/0.4ML ~~LOC~~ SOLN
40.0000 mg | SUBCUTANEOUS | Status: DC
  Administered 2018-05-13: 40 mg via SUBCUTANEOUS
  Filled 2018-05-13: qty 0.4

## 2018-05-13 MED ORDER — LORATADINE 10 MG PO TABS
10.0000 mg | ORAL_TABLET | Freq: Every day | ORAL | Status: DC
  Administered 2018-05-14 – 2018-05-16 (×3): 10 mg via ORAL
  Filled 2018-05-13 (×3): qty 1

## 2018-05-13 MED ORDER — ASPIRIN EC 81 MG PO TBEC
81.0000 mg | DELAYED_RELEASE_TABLET | Freq: Every day | ORAL | Status: DC
  Administered 2018-05-14 – 2018-05-16 (×3): 81 mg via ORAL
  Filled 2018-05-13 (×3): qty 1

## 2018-05-13 MED ORDER — HYDRALAZINE HCL 25 MG PO TABS
25.0000 mg | ORAL_TABLET | Freq: Three times a day (TID) | ORAL | Status: DC
  Administered 2018-05-14 – 2018-05-16 (×7): 25 mg via ORAL
  Filled 2018-05-13 (×7): qty 1

## 2018-05-13 MED ORDER — FLUTICASONE-UMECLIDIN-VILANT 100-62.5-25 MCG/INH IN AEPB: INHALATION_SPRAY | Freq: Every day | RESPIRATORY_TRACT | Status: DC

## 2018-05-13 MED ORDER — ONDANSETRON HCL 4 MG/2ML IJ SOLN: 4.0000 mg | Freq: Four times a day (QID) | INTRAMUSCULAR | Status: DC | PRN

## 2018-05-13 MED ORDER — SODIUM CHLORIDE 0.9 % IV BOLUS
500.0000 mL | Freq: Once | INTRAVENOUS | Status: AC
  Administered 2018-05-13: 500 mL via INTRAVENOUS

## 2018-05-13 MED ORDER — POTASSIUM CHLORIDE IN NACL 20-0.9 MEQ/L-% IV SOLN
INTRAVENOUS | Status: DC
  Administered 2018-05-13: 23:00:00 via INTRAVENOUS
  Filled 2018-05-13: qty 1000

## 2018-05-13 MED ORDER — LEVOFLOXACIN IN D5W 750 MG/150ML IV SOLN: 750.0000 mg | INTRAVENOUS | Status: DC

## 2018-05-13 MED ORDER — FLUTICASONE PROPIONATE 50 MCG/ACT NA SUSP
2.0000 | Freq: Every day | NASAL | Status: DC
  Administered 2018-05-14 – 2018-05-16 (×3): 2 via NASAL
  Filled 2018-05-13 (×2): qty 16

## 2018-05-13 MED ORDER — ISOSORBIDE DINITRATE 20 MG PO TABS
20.0000 mg | ORAL_TABLET | Freq: Three times a day (TID) | ORAL | Status: DC
  Administered 2018-05-14 – 2018-05-16 (×7): 20 mg via ORAL
  Filled 2018-05-13 (×13): qty 1

## 2018-05-13 MED ORDER — LEVOFLOXACIN IN D5W 500 MG/100ML IV SOLN
500.0000 mg | Freq: Once | INTRAVENOUS | Status: AC
  Administered 2018-05-13: 500 mg via INTRAVENOUS
  Filled 2018-05-13: qty 100

## 2018-05-13 MED ORDER — ACETAMINOPHEN 325 MG PO TABS
ORAL_TABLET | ORAL | Status: AC
  Administered 2018-05-13: 650 mg via ORAL
  Filled 2018-05-13: qty 2

## 2018-05-13 MED ORDER — ACETAMINOPHEN 325 MG PO TABS
650.0000 mg | ORAL_TABLET | Freq: Four times a day (QID) | ORAL | Status: DC | PRN
  Administered 2018-05-13 – 2018-05-14 (×3): 650 mg via ORAL
  Filled 2018-05-13: qty 2

## 2018-05-13 MED ORDER — INSULIN REGULAR HUMAN (CONC) 500 UNIT/ML ~~LOC~~ SOPN
40.0000 [IU] | PEN_INJECTOR | Freq: Three times a day (TID) | SUBCUTANEOUS | Status: DC
  Administered 2018-05-14 – 2018-05-15 (×3): 40 [IU] via SUBCUTANEOUS
  Filled 2018-05-13: qty 3

## 2018-05-13 MED ORDER — IOHEXOL 300 MG/ML  SOLN: 75.0000 mL | Freq: Once | INTRAMUSCULAR | Status: DC | PRN

## 2018-05-13 MED ORDER — INSULIN ASPART 100 UNIT/ML ~~LOC~~ SOLN: 0.0000 [IU] | Freq: Every day | SUBCUTANEOUS | Status: DC

## 2018-05-13 MED ORDER — INSULIN ASPART 100 UNIT/ML ~~LOC~~ SOLN
0.0000 [IU] | Freq: Three times a day (TID) | SUBCUTANEOUS | Status: DC
  Administered 2018-05-14: 5 [IU] via SUBCUTANEOUS
  Administered 2018-05-14: 3 [IU] via SUBCUTANEOUS
  Administered 2018-05-14: 5 [IU] via SUBCUTANEOUS
  Administered 2018-05-15: 8 [IU] via SUBCUTANEOUS
  Administered 2018-05-15: 3 [IU] via SUBCUTANEOUS
  Filled 2018-05-13 (×2): qty 1

## 2018-05-13 NOTE — ED Triage Notes (Addendum)
Reports vomiting, diarrhea and cough for 3 weeks.  States she vomited x 3 today and had diarrhea x 2

## 2018-05-13 NOTE — H&P (Signed)
History and Physical  TIFFINE HENIGAN JJO:[id number] DOB: 30-Dec-1955 DOA: 05/13/2018  Referring physician: Dr Lita Mains, ED physician PCP: Rosita Fire, MD  Outpatient Specialists:   Patient Coming From: nursing home  Chief Complaint: cough, emesis  HPI: DULCE MARTIAN is a 63 y.o. female with a history of cognitive delay, HTN, DM2, grade 1 diastolic heart failure with preserved EF (last echo 10/2016), Asthma. She presents with worsening cough with thick sputum over the past week. Also complains of fever and chills and has had episodes of vomiting. Emesis described as phlegm. Multiple other residents with flu-like symptoms. No palliating or provoking features.   Emergency Department Course: CXR increasing mass-like density in LLL at site of previous atelectasis and scarring.   Review of Systems:   Pt denies any constipation, abdominal pain, palpitations, headache, vision changes, lightheadedness, dizziness, melena, rectal bleeding.  Review of systems are otherwise negative  Past Medical History:  Diagnosis Date  . Arthritis   . Asthma   . CHF (congestive heart failure) ([place])   . Diabetes mellitus without complication ([place])   . Hypertension   . Iron deficiency anemia 10/16/2010  . Mental handicap 10/16/2010   Past Surgical History:  Procedure Laterality Date  . ABDOMINAL HYSTERECTOMY    . CESAREAN SECTION    . CHOLECYSTECTOMY    . COLONOSCOPY  08/2010   normal TI, sigmoid polyp (adenoma ). Next TCS due  08/2015,  . ESOPHAGOGASTRODUODENOSCOPY  08/2010   antral and duodenal erosions s/p bx (chronic gastritis, no h.pylori, no celiac dz ), hiatal hernia  . KNEE SURGERY     right knee @ 63 years of age  . LEFT AND RIGHT HEART CATHETERIZATION WITH CORONARY ANGIOGRAM N/A 09/21/2011   Procedure: LEFT AND RIGHT HEART CATHETERIZATION WITH CORONARY ANGIOGRAM;  Surgeon: Birdie Riddle, MD;  Location: [facility];  Service: Cardiovascular;  Laterality: N/A;   Social History:  reports that  she quit smoking about 22 years ago. Her smoking use included cigarettes. She smoked 1.00 pack per day for 0.00 years. She has never used smokeless tobacco. She reports that she does not drink alcohol or use drugs. Patient lives at nursing home  No Known Allergies  Family History  Problem Relation Age of Onset  . Diabetes Mother   . Hypertension Mother   . Hypertension Father   . Kidney disease Other        son reportedly has had cyst on kidney and lung s/p surgery at [facility]  . Hypertension Son   . Colon cancer Neg Hx       Prior to Admission medications   Medication Sig Start Date End Date Taking? Authorizing Provider  ACCU-CHEK AVIVA PLUS test strip USE TO TEST THREE TIMES DAILY. 02/23/18  Yes Nida, Marella Chimes, MD  ACCU-CHEK SOFTCLIX LANCETS lancets USE TO TEST THREE TIMES DAILY. 02/23/18  Yes Cassandria Anger, MD  aspirin EC 81 MG tablet Take 81 mg by mouth daily.   Yes [provider]  Choline Fenofibrate (FENOFIBRIC ACID) 135 MG CPDR Take 1 capsule by mouth daily.   Yes [provider]  fluticasone (VERAMYST) 27.5 MCG/SPRAY nasal spray Place 2 sprays into the nose daily.   Yes [provider]  Fluticasone-Umeclidin-Vilant (TRELEGY ELLIPTA) 100-62.5-25 MCG/INH AEPB Inhale into the lungs daily.   Yes [provider]  furosemide (LASIX) 40 MG tablet Take 40 mg by mouth daily.   Yes [provider]  hydrALAZINE (APRESOLINE) 25 MG tablet Take 25 mg by mouth  3 (three) times daily.   Yes [provider]  insulin regular human CONCENTRATED (HUMULIN R U-500 KWIKPEN) 500 UNIT/ML kwikpen Inject 40 Units into the skin 3 (three) times daily with meals. Inject 40 units with breakfast, 40 units with lunch, and 40 units with supper when blood glucose before food is greater than 90 mg/dL 02/23/18  Yes Nida, Marella Chimes, MD  isosorbide dinitrate (ISORDIL) 20 MG tablet Take 20 mg by mouth 3 (three) times daily.   Yes [provider]  lisinopril (PRINIVIL,ZESTRIL) 20 MG tablet Take 20 mg by mouth daily.   Yes [provider]  loratadine (CLARITIN) 10 MG tablet Take 10 mg by mouth daily.   Yes [provider]  losartan (COZAAR) 100 MG tablet Take 1 tablet by mouth daily. 05/09/18  Yes [provider]  NOVOFINE AUTOCOVER 30G X 8 MM MISC USE AS DIRECTED FOR 3 TIMES DAILY INSULIN ADMINISTRATION. 04/04/18  Yes Nida, Marella Chimes, MD  potassium chloride SA (K-DUR,KLOR-CON) 20 MEQ tablet daily. 11/10/17  Yes [provider]  SURE COMFORT PEN NEEDLES 31G X 8 MM [employer] USE AS DIRECTED WITH LEVEMIR AND NOVOLOG. UP TO FOUR TIMES DAILY. 02/12/17  Yes Cassandria Anger, MD    Physical Exam: BP 130/77   Pulse (!) 116   Temp 99.6 F (37.6 C) (Temporal)   Resp (!) 29   Wt 125 kg   SpO2 94%   BMI 46.57 kg/m   . General: Elderly caucasian female. Awake and alert and oriented x3. No acute cardiopulmonary distress.  Marland Kitchen HEENT: Normocephalic atraumatic.  Right and left ears normal in appearance.  Pupils equal, round, reactive to light. Extraocular muscles are intact. Sclerae anicteric and noninjected.  Moist mucosal membranes. No mucosal lesions.  . Neck: Neck supple without lymphadenopathy. No carotid bruits. No masses palpated.  . Cardiovascular: Regular rate with normal S1-S2 sounds. No murmurs, rubs, gallops auscultated. No JVD.  Marland Kitchen Respiratory: decreased respiratory effort with rales in left base. No accessory muscle use. . Abdomen: Soft, nontender, nondistended. Active bowel sounds. No masses or hepatosplenomegaly  . Skin: No rashes, lesions, or ulcerations.  Dry, warm to touch. 2+ dorsalis pedis and radial pulses. . Musculoskeletal: No calf or leg pain. All major joints not erythematous nontender.  No upper or lower joint deformation.  Good ROM.  No contractures  . Psychiatric: Intact judgment and insight. Pleasant and cooperative. . Neurologic: No focal neurological deficits.  Strength is 5/5 and symmetric in upper and lower extremities.  Cranial nerves II through XII are grossly intact.           Labs on Admission: I have personally reviewed following labs and imaging studies  CBC: Recent Labs  Lab 05/13/18 1817  WBC 15.0*  NEUTROABS 12.4*  HGB 9.8*  HCT 33.2*  MCV 94.3  PLT 545*   Basic Metabolic Panel: Recent Labs  Lab 05/13/18 1817  NA 137  K 4.6  CL 103  CO2 23  GLUCOSE 75  BUN 39*  CREATININE 1.83*  CALCIUM 9.0  MG 2.3   GFR: Estimated Creatinine Clearance: 42 mL/min (A) (by C-G formula based on SCr of 1.83 mg/dL (H)). Liver Function Tests: Recent Labs  Lab 05/13/18 1817  AST 29  ALT 32  ALKPHOS 93  BILITOT 0.6  PROT 8.6*  ALBUMIN 2.8*   Recent Labs  Lab 05/13/18 1817  LIPASE 24   No results for input(s): AMMONIA in the last 168 hours. Coagulation Profile: No results for input(s): INR,  PROTIME in the last 168 hours. Cardiac Enzymes: No results for input(s): CKTOTAL, CKMB, CKMBINDEX, TROPONINI in the last 168 hours. BNP (last 3 results) No results for input(s): PROBNP in the last 8760 hours. HbA1C: No results for input(s): HGBA1C in the last 72 hours. CBG: No results for input(s): GLUCAP in the last 168 hours. Lipid Profile: No results for input(s): CHOL, HDL, LDLCALC, TRIG, CHOLHDL, LDLDIRECT in the last 72 hours. Thyroid Function Tests: No results for input(s): TSH, T4TOTAL, FREET4, T3FREE, THYROIDAB in the last 72 hours. Anemia Panel: No results for input(s): VITAMINB12, FOLATE, FERRITIN, TIBC, IRON, RETICCTPCT in the last 72 hours. Urine analysis:    Component Value Date/Time   COLORURINE YELLOW 02/01/2013 1145   APPEARANCEUR CLEAR 02/01/2013 1145   LABSPEC 1.020 02/01/2013 1145   PHURINE 5.5 02/01/2013 1145   GLUCOSEU 500 (A) 02/01/2013 1145   HGBUR NEGATIVE 02/01/2013 1145   BILIRUBINUR NEGATIVE 02/01/2013 1145   KETONESUR NEGATIVE 02/01/2013 1145   PROTEINUR NEGATIVE 02/01/2013 1145   UROBILINOGEN  0.2 02/01/2013 1145   NITRITE NEGATIVE 02/01/2013 1145   LEUKOCYTESUR TRACE (A) 02/01/2013 1145   Sepsis Labs: @LABRCNTIP (procalcitonin:4,lacticidven:4) ) Recent Results (from the past 240 hour(s))  Culture, blood (Routine X 2) w Reflex to ID Panel     Status: None (Preliminary result)   Collection Time: 05/13/18  6:17 PM  Result Value Ref Range Status   Specimen Description RIGHT ANTECUBITAL  Final   Special Requests   Final    BOTTLES DRAWN AEROBIC ONLY Blood Culture adequate volume Performed at [facility], [address], [place], [place] [address]    Culture PENDING  Incomplete   Report Status PENDING  Incomplete  Culture, blood (Routine X 2) w Reflex to ID Panel     Status: None (Preliminary result)   Collection Time: 05/13/18  6:24 PM  Result Value Ref Range Status   Specimen Description LEFT ANTECUBITAL  Final   Special Requests   Final    BOTTLES DRAWN AEROBIC AND ANAEROBIC Blood Culture adequate volume Performed at [facility], [address], [place], [place] [address]    Culture PENDING  Incomplete   Report Status PENDING  Incomplete     Radiological Exams on Admission: Dg Chest 2 View  Result Date: 05/13/2018 CLINICAL DATA:  Cough with vomiting, diarrhea for 3 weeks. EXAM: CHEST - 2 VIEW COMPARISON:  Radiographs 09/03/2017.  CT 08/10/2017. FINDINGS: The heart size is stable at the upper limits of normal for AP technique. There is chronic left lower lobe opacity which has become more mass-like on today's lateral view, previously linear. The lungs are otherwise clear. There is no pleural effusion or pneumothorax. The bones appear unchanged. IMPRESSION: Increased, mass-like density surrounding the previously demonstrated linear left lower lobe atelectasis/scarring. This could be secondary to superimposed inflammation, although repeat chest CT recommended to exclude developing neoplasm. Electronically Signed   By: Richardean Sale M.D.   On: 05/13/2018 19:40     Assessment/Plan: Principal Problem:   CAP (community acquired pneumonia) Active Problems:   Mental handicap   Essential hypertension, benign   Acute kidney injury superimposed on chronic kidney disease ([place])   Uncontrolled type 2 diabetes mellitus with stage 4 chronic kidney disease ([place])   Morbid obesity due to excess calories [facility])    This patient was discussed with the ED physician, including pertinent vitals, physical exam findings, labs, and imaging.  We also discussed care given by the ED provider.  1. CAP Antibiotics: levoquin Robitussin Blood cultures drawn  in the emergency department Sputum cultures CBC tomorrow Strep antigen by urine Influenza negative procalcitonin Will get CT chest to evaluate LLQ 2. DM2 a. Continue home insulin and SSI 3. HTN a. Continue antihypertensives, except ARB and ACE (I'm unsure why patient is on both) 4. AKI on CKD a. Hold ARB and ACE until Cr returns to baseline b. Will give light IVF 5. Diastolic CHF a. Compensated b. Hold lasix and gently hydrate 6. Obesity 7. Cognitive delay  DVT prophylaxis: lovenox Consultants: none Code Status: full code currently Family Communication: none  Disposition Plan: return to nursin home following improvement.   Truett Mainland, DO

## 2018-05-13 NOTE — ED Provider Notes (Signed)
[facility] EMERGENCY DEPARTMENT Provider Note   CSN: [id number] Arrival date & time: 05/13/18  1721     History   Chief Complaint Chief Complaint  Patient presents with  . Emesis    HPI [name] is a 63 y.o. female.  HPI Patient is coming from nursing home.  She is had several weeks of cough productive of thick sputum.  This is worsened in the last week.  She is had shaking chills and subjective fever.  Patient also states she has had multiple episodes of vomiting and diarrhea.  No blood in the stool or in the vomit.  There have been multiple other nursing home residents with flulike symptoms. Past Medical History:  Diagnosis Date  . Arthritis   . Asthma   . CHF (congestive heart failure) ([place])   . Diabetes mellitus without complication ([place])   . Hypertension   . Iron deficiency anemia 10/16/2010  . Mental handicap 10/16/2010    Patient Active Problem List   Diagnosis Date Noted  . CAP (community acquired pneumonia) 05/13/2018  . Mixed hyperlipidemia 11/23/2017  . Hypercortisolemia ([place]) 04/27/2016  . Morbid obesity due to excess calories ([place]) 07/02/2015  . Uncontrolled type 2 diabetes mellitus with stage 4 chronic kidney disease ([place]) 02/26/2015  . Acute kidney injury superimposed on chronic kidney disease ([place]) 10/07/2011  . Cutaneous candidiasis 10/21/2010  . Essential hypertension, benign 10/21/2010  . Iron deficiency anemia 10/16/2010  . Mental handicap 10/16/2010    Past Surgical History:  Procedure Laterality Date  . ABDOMINAL HYSTERECTOMY    . CESAREAN SECTION    . CHOLECYSTECTOMY    . COLONOSCOPY  08/2010   normal TI, sigmoid polyp (adenoma ). Next TCS due  08/2015,  . ESOPHAGOGASTRODUODENOSCOPY  08/2010   antral and duodenal erosions s/p bx (chronic gastritis, no h.pylori, no celiac dz ), hiatal hernia  . KNEE SURGERY     right knee @ 63 years of age  . LEFT AND RIGHT HEART CATHETERIZATION WITH CORONARY ANGIOGRAM N/A 09/21/2011   Procedure: LEFT  AND RIGHT HEART CATHETERIZATION WITH CORONARY ANGIOGRAM;  Surgeon: Birdie Riddle, MD;  Location: [facility] CATH LAB;  Service: Cardiovascular;  Laterality: N/A;     OB History   No obstetric history on file.      Home Medications    Prior to Admission medications   Medication Sig Start Date End Date Taking? Authorizing Provider  ACCU-CHEK AVIVA PLUS test strip USE TO TEST THREE TIMES DAILY. 02/23/18  Yes Nida, Marella Chimes, MD  ACCU-CHEK SOFTCLIX LANCETS lancets USE TO TEST THREE TIMES DAILY. 02/23/18  Yes Cassandria Anger, MD  aspirin EC 81 MG tablet Take 81 mg by mouth daily.   Yes [provider]  Choline Fenofibrate (FENOFIBRIC ACID) 135 MG CPDR Take 1 capsule by mouth daily.   Yes [provider]  fluticasone (VERAMYST) 27.5 MCG/SPRAY nasal spray Place 2 sprays into the nose daily.   Yes [provider]  Fluticasone-Umeclidin-Vilant (TRELEGY ELLIPTA) 100-62.5-25 MCG/INH AEPB Inhale into the lungs daily.   Yes [provider]  furosemide (LASIX) 40 MG tablet Take 40 mg by mouth daily.   Yes [provider]  hydrALAZINE (APRESOLINE) 25 MG tablet Take 25 mg by mouth 3 (three) times daily.   Yes [provider]  insulin regular human CONCENTRATED (HUMULIN R U-500 KWIKPEN) 500 UNIT/ML kwikpen Inject 40 Units into the skin 3 (three) times daily with meals. Inject 40 units with breakfast, 40 units with lunch, and  40 units with supper when blood glucose before food is greater than 90 mg/dL 02/23/18  Yes Nida, Marella Chimes, MD  isosorbide dinitrate (ISORDIL) 20 MG tablet Take 20 mg by mouth 3 (three) times daily.   Yes [provider]  lisinopril (PRINIVIL,ZESTRIL) 20 MG tablet Take 20 mg by mouth daily.   Yes [provider]  loratadine (CLARITIN) 10 MG tablet Take 10 mg by mouth daily.   Yes [provider]  losartan (COZAAR) 100 MG tablet Take 1 tablet by mouth daily. 05/09/18  Yes [provider]    NOVOFINE AUTOCOVER 30G X 8 MM MISC USE AS DIRECTED FOR 3 TIMES DAILY INSULIN ADMINISTRATION. 04/04/18  Yes Nida, Marella Chimes, MD  potassium chloride SA (K-DUR,KLOR-CON) 20 MEQ tablet daily. 11/10/17  Yes [provider]  SURE COMFORT PEN NEEDLES 31G X 8 MM [place] USE AS DIRECTED WITH LEVEMIR AND NOVOLOG. UP TO FOUR TIMES DAILY. 02/12/17  Yes Cassandria Anger, MD    Family History Family History  Problem Relation Age of Onset  . Diabetes Mother   . Hypertension Mother   . Hypertension Father   . Kidney disease Other        son reportedly has had cyst on kidney and lung s/p [facility]  . Hypertension Son   . Colon cancer Neg Hx     Social History Social History   Tobacco Use  . Smoking status: Former Smoker    Packs/day: 1.00    Years: 0.00    Pack years: 0.00    Types: Cigarettes    Last attempt to quit: 04/11/1996    Years since quitting: 22.1  . Smokeless tobacco: Never Used  . Tobacco comment: quit a couple year ago  Substance Use Topics  . Alcohol use: No  . Drug use: No     Allergies   Patient has no known allergies.   Review of Systems Review of Systems  Constitutional: Positive for chills, fatigue and fever.  HENT: Negative for congestion, sore throat and trouble swallowing.   Eyes: Negative for pain and visual disturbance.  Respiratory: Positive for cough. Negative for shortness of breath and wheezing.   Cardiovascular: Negative for chest pain.  Gastrointestinal: Positive for abdominal pain, diarrhea, nausea and vomiting. Negative for blood in stool.  Genitourinary: Negative for dysuria, flank pain and frequency.  Musculoskeletal: Negative for back pain, myalgias and neck pain.  Skin: Negative for rash and wound.  Neurological: Negative for dizziness, weakness, light-headedness, numbness and headaches.  All other systems reviewed and are negative.    Physical Exam Updated Vital Signs BP 130/77   Pulse (!) 116   Temp 99.6 F (37.6  C) (Temporal)   Resp (!) 29   Wt 125 kg   SpO2 94%   BMI 46.57 kg/m   Physical Exam Vitals signs and nursing note reviewed.  Constitutional:      Appearance: She is well-developed. She is obese.  HENT:     Head: Normocephalic and atraumatic.     Nose: Nose normal.     Mouth/Throat:     Mouth: Mucous membranes are moist.     Pharynx: No oropharyngeal exudate or posterior oropharyngeal erythema.  Eyes:     Extraocular Movements: Extraocular movements intact.     Conjunctiva/sclera: Conjunctivae normal.     Pupils: Pupils are equal, round, and reactive to light.  Neck:     Musculoskeletal: Normal range of motion and neck supple. No neck rigidity or muscular tenderness.  Cardiovascular:     Rate and Rhythm: Regular rhythm. Tachycardia present.     Heart sounds: No murmur. No friction rub. No gallop.   Pulmonary:     Effort: Pulmonary effort is normal.     Breath sounds: Rhonchi present.     Comments: Diminished breath sounds and scattered rhonchi in bilateral bases. Abdominal:     General: Bowel sounds are normal.     Palpations: Abdomen is soft.     Tenderness: There is abdominal tenderness. There is no guarding or rebound.     Comments: Mild diffuse tenderness to palpation without focality.  No rebound or guarding.  Musculoskeletal: Normal range of motion.        General: No swelling, tenderness, deformity or signs of injury.     Right lower leg: No edema.     Left lower leg: No edema.     Comments: No midline thoracic or lumbar tenderness.  No CVA tenderness.  Lymphadenopathy:     Cervical: No cervical adenopathy.  Skin:    General: Skin is warm and dry.     Capillary Refill: Capillary refill takes less than 2 seconds.     Findings: No erythema or rash.  Neurological:     General: No focal deficit present.     Mental Status: She is alert and oriented to person, place, and time.  Psychiatric:        Mood and Affect: Mood normal.        Behavior: Behavior normal.       ED Treatments / Results  Labs (all labs ordered are listed, but only abnormal results are displayed) Labs Reviewed  CBC WITH DIFFERENTIAL/PLATELET - Abnormal; Notable for the following components:      Result Value   WBC 15.0 (*)    RBC 3.52 (*)    Hemoglobin 9.8 (*)    HCT 33.2 (*)    MCHC 29.5 (*)    Platelets 515 (*)    Neutro Abs 12.4 (*)    Monocytes Absolute 1.4 (*)    Abs Immature Granulocytes 0.21 (*)    All other components within normal limits  COMPREHENSIVE METABOLIC PANEL - Abnormal; Notable for the following components:   BUN 39 (*)    Creatinine, Ser 1.83 (*)    Total Protein 8.6 (*)    Albumin 2.8 (*)    GFR calc non Af Amer 29 (*)    GFR calc Af Amer 34 (*)    All other components within normal limits  CULTURE, BLOOD (ROUTINE X 2)  CULTURE, BLOOD (ROUTINE X 2)  LIPASE, BLOOD  LACTIC ACID, PLASMA  MAGNESIUM  INFLUENZA PANEL BY PCR (TYPE A & B)  URINALYSIS, ROUTINE W REFLEX MICROSCOPIC    EKG EKG Interpretation  Date/Time:  Friday May 13 2018 21:18:06 EST Ventricular Rate:  114 PR Interval:    QRS Duration: 91 QT Interval:  300 QTC Calculation: 414 R Axis:   58 Text Interpretation:  Sinus tachycardia Borderline T abnormalities, inferior leads Confirmed by Julianne Rice [phone]) on 05/13/2018 9:21:17 PM   Radiology Dg Chest 2 View  Result Date: 05/13/2018 CLINICAL DATA:  Cough with vomiting, diarrhea for 3 weeks. EXAM: CHEST - 2 VIEW COMPARISON:  Radiographs 09/03/2017.  CT 08/10/2017. FINDINGS: The heart size is stable at the upper limits of normal for AP technique. There is chronic left lower lobe opacity which has become more mass-like on today's lateral view, previously linear. The lungs are otherwise clear. There is no pleural  effusion or pneumothorax. The bones appear unchanged. IMPRESSION: Increased, mass-like density surrounding the previously demonstrated linear left lower lobe atelectasis/scarring. This could be secondary to  superimposed inflammation, although repeat chest CT recommended to exclude developing neoplasm. Electronically Signed   By: Richardean Sale M.D.   On: 05/13/2018 19:40    Procedures Procedures (including critical care time)  Medications Ordered in ED Medications  sodium chloride 0.9 % bolus 500 mL (has no administration in time range)  levofloxacin (LEVAQUIN) IVPB 500 mg (has no administration in time range)     Initial Impression / Assessment and Plan / ED Course  I have reviewed the triage vital signs and the nursing notes.  Pertinent labs & imaging results that were available during my care of the patient were reviewed by me and considered in my medical decision making (see chart for details).    Elevated white blood cell count.  Infiltrate in the left base.  Possible atelectasis but with history of fever, productive cough and elevated white blood cell count.  Will treat as pneumonia.  Discussed with hospitalist who will see patient in the emergency department.   Final Clinical Impressions(s) / ED Diagnoses   Final diagnoses:  Community acquired pneumonia of left lower lobe of lung [facility])    ED Discharge Orders    None       Julianne Rice, MD 05/13/18 2121

## 2018-05-14 ENCOUNTER — Other Ambulatory Visit: Payer: Self-pay

## 2018-05-14 LAB — CBG MONITORING, ED
Glucose-Capillary: 193 mg/dL — ABNORMAL HIGH (ref 70–99)
Glucose-Capillary: 243 mg/dL — ABNORMAL HIGH (ref 70–99)

## 2018-05-14 LAB — CBC
HCT: 30.1 % — ABNORMAL LOW (ref 36.0–46.0)
Hemoglobin: 9 g/dL — ABNORMAL LOW (ref 12.0–15.0)
MCH: 28.7 pg (ref 26.0–34.0)
MCHC: 29.9 g/dL — ABNORMAL LOW (ref 30.0–36.0)
MCV: 95.9 fL (ref 80.0–100.0)
Platelets: 412 10*3/uL — ABNORMAL HIGH (ref 150–400)
RBC: 3.14 MIL/uL — ABNORMAL LOW (ref 3.87–5.11)
RDW: 13.9 % (ref 11.5–15.5)
WBC: 15.2 10*3/uL — ABNORMAL HIGH (ref 4.0–10.5)
nRBC: 0 % (ref 0.0–0.2)

## 2018-05-14 LAB — BASIC METABOLIC PANEL
Anion gap: 11 (ref 5–15)
BUN: 30 mg/dL — ABNORMAL HIGH (ref 8–23)
CO2: 22 mmol/L (ref 22–32)
Calcium: 8.4 mg/dL — ABNORMAL LOW (ref 8.9–10.3)
Chloride: 102 mmol/L (ref 98–111)
Creatinine, Ser: 1.56 mg/dL — ABNORMAL HIGH (ref 0.44–1.00)
GFR calc Af Amer: 41 mL/min — ABNORMAL LOW (ref >60)
GFR calc non Af Amer: 35 mL/min — ABNORMAL LOW (ref >60)
Glucose, Bld: 185 mg/dL — ABNORMAL HIGH (ref 70–99)
Potassium: 5 mmol/L (ref 3.5–5.1)
Sodium: 135 mmol/L (ref 135–145)

## 2018-05-14 LAB — STREP PNEUMONIAE URINARY ANTIGEN: Strep Pneumo Urinary Antigen: NEGATIVE

## 2018-05-14 LAB — MRSA PCR SCREENING: MRSA by PCR: NEGATIVE

## 2018-05-14 LAB — PROCALCITONIN: Procalcitonin: 0.34 ng/mL

## 2018-05-14 LAB — GLUCOSE, CAPILLARY
Glucose-Capillary: 207 mg/dL — ABNORMAL HIGH (ref 70–99)
Glucose-Capillary: 193 mg/dL — ABNORMAL HIGH (ref 70–99)

## 2018-05-14 LAB — URINALYSIS, ROUTINE W REFLEX MICROSCOPIC
Bilirubin Urine: NEGATIVE
Glucose, UA: 50 mg/dL — AB
Hgb urine dipstick: NEGATIVE
Ketones, ur: NEGATIVE mg/dL
Leukocytes,Ua: NEGATIVE
Nitrite: NEGATIVE
Protein, ur: NEGATIVE mg/dL
Specific Gravity, Urine: 1.009 (ref 1.005–1.030)
pH: 5 (ref 5.0–8.0)

## 2018-05-14 MED ORDER — FLUTICASONE FUROATE-VILANTEROL 100-25 MCG/INH IN AEPB
1.0000 | INHALATION_SPRAY | Freq: Every day | RESPIRATORY_TRACT | Status: DC
  Filled 2018-05-14 (×2): qty 28

## 2018-05-14 MED ORDER — LOSARTAN POTASSIUM 50 MG PO TABS
100.0000 mg | ORAL_TABLET | Freq: Every day | ORAL | Status: DC
  Administered 2018-05-15 – 2018-05-16 (×2): 100 mg via ORAL
  Filled 2018-05-14 (×2): qty 2

## 2018-05-14 MED ORDER — PNEUMOCOCCAL VAC POLYVALENT 25 MCG/0.5ML IJ INJ
0.5000 mL | INJECTION | INTRAMUSCULAR | Status: AC
  Administered 2018-05-15: 0.5 mL via INTRAMUSCULAR
  Filled 2018-05-14: qty 0.5

## 2018-05-14 MED ORDER — INFLUENZA VAC SPLIT QUAD 0.5 ML IM SUSY
0.5000 mL | PREFILLED_SYRINGE | INTRAMUSCULAR | Status: AC
  Administered 2018-05-15: 0.5 mL via INTRAMUSCULAR
  Filled 2018-05-14: qty 0.5

## 2018-05-14 MED ORDER — ENOXAPARIN SODIUM 60 MG/0.6ML ~~LOC~~ SOLN
60.0000 mg | SUBCUTANEOUS | Status: DC
  Administered 2018-05-14 – 2018-05-15 (×2): 60 mg via SUBCUTANEOUS
  Filled 2018-05-14 (×2): qty 0.6

## 2018-05-14 MED ORDER — LEVOFLOXACIN IN D5W 500 MG/100ML IV SOLN
500.0000 mg | INTRAVENOUS | Status: DC
  Administered 2018-05-14 – 2018-05-15 (×2): 500 mg via INTRAVENOUS
  Filled 2018-05-14 (×2): qty 100

## 2018-05-14 MED ORDER — GUAIFENESIN 100 MG/5ML PO SOLN
5.0000 mL | ORAL | Status: DC | PRN
  Administered 2018-05-14 – 2018-05-15 (×4): 100 mg via ORAL
  Filled 2018-05-14 (×4): qty 5

## 2018-05-14 MED ORDER — UMECLIDINIUM BROMIDE 62.5 MCG/INH IN AEPB
1.0000 | INHALATION_SPRAY | Freq: Every day | RESPIRATORY_TRACT | Status: DC
  Administered 2018-05-14 – 2018-05-16 (×3): 1 via RESPIRATORY_TRACT
  Filled 2018-05-14: qty 7

## 2018-05-14 NOTE — Progress Notes (Signed)
PROGRESS NOTE    [name]  ZOX:[id number] DOB: 1955-09-10 DOA: 05/13/2018 PCP: Rosita Fire, MD   Brief Narrative:  Per HPI: [name] is a 63 y.o. female with a history of cognitive delay, HTN, DM2, grade 1 diastolic heart failure with preserved EF (last echo 10/2016), Asthma. She presents with worsening cough with thick sputum over the past week. Also complains of fever and chills and has had episodes of vomiting. Emesis described as phlegm. Multiple other residents with flu-like symptoms. No palliating or provoking features.   Patient has been admitted with community-acquired pneumonia and started on Levaquin.  She was also noted to have some AKI which is improving.  Additionally, CT chest does appear to demonstrate a potential neoplastic process to her left lower lung base which will require repeat CT in the next 4 weeks for further evaluation.  Assessment & Plan:   Principal Problem:   CAP (community acquired pneumonia) Active Problems:   Mental handicap   Essential hypertension, benign   Acute kidney injury superimposed on chronic kidney disease ([place])   Uncontrolled type 2 diabetes mellitus with stage 4 chronic kidney disease ([place])   Morbid obesity due to excess calories ([place])  1. CAP a. Continue Levaquin and await further culture results b. Robitussin c. Strep antigen by urine pending d. Influenza negative e. procalcitonin elevated f. CT chest noted with left lower lobe mass which will need further evaluation in 4 weeks with repeat after pneumonia resolution 2. DM2-with hyperglycemia a. Continue home insulin and SSI; adjust as needed 3. HTN a. Continue antihypertensives and will resume ARB today b. Will discontinue IV fluid today 4. AKI on CKD-improved a. Resume ARB today and hold ACE b. DC IV fluid 5. Diastolic CHF a. Compensated b. Hold Lasix for now and resume by a.m. if volume begins to rise 6. Obesity 7. Cognitive delay   DVT prophylaxis:  Lovenox Code Status: None Family Communication: Full Disposition Plan: Return to SNF after improvement on Levaquin.   Consultants:   None  Procedures:   None  Antimicrobials:   Levaquin 2/14->   Subjective: Patient seen and evaluated today with no new acute complaints or concerns. No acute concerns or events noted overnight.  She continues to have left lower chest pain/left upper quadrant abdominal pain.  Objective: Vitals:   05/14/18 0500 05/14/18 0600 05/14/18 0700 05/14/18 0734  BP: 135/65 (!) 150/77 (!) 155/81 (!) 155/81  Pulse: 94 90 90 95  Resp: 19 (!) 21 20 18   Temp:    98.4 F (36.9 C)  TempSrc:    Oral  SpO2: 91% 95% 94% 98%  Weight:        Intake/Output Summary (Last 24 hours) at 05/14/2018 0834 Last data filed at 05/14/2018 0736 Gross per 24 hour  Intake 831.73 ml  Output -  Net 831.73 ml   Filed Weights   05/13/18 1728  Weight: 125 kg    Examination:  General exam: Appears calm and comfortable, obese Respiratory system: Clear to auscultation. Respiratory effort normal.  Currently on room air Cardiovascular system: S1 & S2 heard, RRR. No JVD, murmurs, rubs, gallops or clicks. No pedal edema. Gastrointestinal system: Abdomen is nondistended, soft and nontender. No organomegaly or masses felt. Normal bowel sounds heard. Central nervous system: Alert and oriented. No focal neurological deficits. Extremities: Symmetric 5 x 5 power. Skin: No rashes, lesions or ulcers Psychiatry: Difficult to assess.    Data Reviewed: I have personally reviewed following labs and imaging studies  CBC: Recent Labs  Lab 05/13/18 1817 05/14/18 0614  WBC 15.0* 15.2*  NEUTROABS 12.4*  --   HGB 9.8* 9.0*  HCT 33.2* 30.1*  MCV 94.3 95.9  PLT 515* 741*   Basic Metabolic Panel: Recent Labs  Lab 05/13/18 1817 05/14/18 0614  NA 137 135  K 4.6 5.0  CL 103 102  CO2 23 22  GLUCOSE 75 185*  BUN 39* 30*  CREATININE 1.83* 1.56*  CALCIUM 9.0 8.4*  MG 2.3  --     GFR: Estimated Creatinine Clearance: 49.3 mL/min (A) (by C-G formula based on SCr of 1.56 mg/dL (H)). Liver Function Tests: Recent Labs  Lab 05/13/18 1817  AST 29  ALT 32  ALKPHOS 93  BILITOT 0.6  PROT 8.6*  ALBUMIN 2.8*   Recent Labs  Lab 05/13/18 1817  LIPASE 24   No results for input(s): AMMONIA in the last 168 hours. Coagulation Profile: No results for input(s): INR, PROTIME in the last 168 hours. Cardiac Enzymes: No results for input(s): CKTOTAL, CKMB, CKMBINDEX, TROPONINI in the last 168 hours. BNP (last 3 results) No results for input(s): PROBNP in the last 8760 hours. HbA1C: No results for input(s): HGBA1C in the last 72 hours. CBG: Recent Labs  Lab 05/13/18 2245 05/14/18 0737  GLUCAP 173* 193*   Lipid Profile: No results for input(s): CHOL, HDL, LDLCALC, TRIG, CHOLHDL, LDLDIRECT in the last 72 hours. Thyroid Function Tests: No results for input(s): TSH, T4TOTAL, FREET4, T3FREE, THYROIDAB in the last 72 hours. Anemia Panel: No results for input(s): VITAMINB12, FOLATE, FERRITIN, TIBC, IRON, RETICCTPCT in the last 72 hours. Sepsis Labs: Recent Labs  Lab 05/13/18 1817 05/14/18 0614  PROCALCITON 0.30 0.34  LATICACIDVEN 0.7  --     Recent Results (from the past 240 hour(s))  Culture, blood (Routine X 2) w Reflex to ID Panel     Status: None (Preliminary result)   Collection Time: 05/13/18  6:17 PM  Result Value Ref Range Status   Specimen Description RIGHT ANTECUBITAL  Final   Special Requests   Final    BOTTLES DRAWN AEROBIC ONLY Blood Culture adequate volume Performed at [facility], [address], [place], [place] [address]    Culture PENDING  Incomplete   Report Status PENDING  Incomplete  Culture, blood (Routine X 2) w Reflex to ID Panel     Status: None (Preliminary result)   Collection Time: 05/13/18  6:24 PM  Result Value Ref Range Status   Specimen Description LEFT ANTECUBITAL  Final   Special Requests   Final    BOTTLES DRAWN AEROBIC  AND ANAEROBIC Blood Culture adequate volume Performed at [facility], [address] [place], [place] [address]    Culture PENDING  Incomplete   Report Status PENDING  Incomplete         Radiology Studies: Dg Chest 2 View  Result Date: 05/13/2018 CLINICAL DATA:  Cough with vomiting, diarrhea for 3 weeks. EXAM: CHEST - 2 VIEW COMPARISON:  Radiographs 09/03/2017.  CT 08/10/2017. FINDINGS: The heart size is stable at the upper limits of normal for AP technique. There is chronic left lower lobe opacity which has become more mass-like on today's lateral view, previously linear. The lungs are otherwise clear. There is no pleural effusion or pneumothorax. The bones appear unchanged. IMPRESSION: Increased, mass-like density surrounding the previously demonstrated linear left lower lobe atelectasis/scarring. This could be secondary to superimposed inflammation, although repeat chest CT recommended to exclude developing neoplasm. Electronically Signed   By: Gwyndolyn Saxon  Lin Landsman M.D.   On: 05/13/2018 19:40   Ct Chest Wo Contrast  Result Date: 05/14/2018 CLINICAL DATA:  Chest wall pain. No IV contrast due to poor renal function. Evaluate masslike density left lower lobe. Worsening cough over the past week. Fever and chills with episodes of vomiting. EXAM: CT CHEST WITHOUT CONTRAST TECHNIQUE: Multidetector CT imaging of the chest was performed following the standard protocol without IV contrast. COMPARISON:  Chest CT 08/10/2017 and abdominal CT 05/21/2016 FINDINGS: Cardiovascular: Heart is normal size. Calcification over the left main coronary artery. Minimal calcified plaque over the thoracic aorta. Vascular structures are otherwise unremarkable. Mediastinum/Nodes: Few shotty mediastinal lymph nodes which are more prominent than seen previously. Suggestion of left infrahilar adenopathy although difficult to assess without intravenous contrast. Remaining mediastinal structures are unremarkable. Lungs/Pleura: Lungs  are adequately inflated. There is masslike consolidation over the posterior left lower lobe measuring approximately 5 x 8.2 cm. This is present over the area of previously seen linear airspace density. There is cut off of the left lower lobe bronchus entering into this region of the lower lobe just proximal to this masslike consolidation. There is minimal adjacent patchy airspace density in the left lower lobe. Tiny amount of left pleural fluid remainder of the left lung as well as the right lung are clear. Upper Abdomen: Right adrenal mass without significant change from the prior exam although difficult to evaluate without intravenous contrast as it abuts the adjacent IVC. Previous cholecystectomy. Minimal calcified plaque over the abdominal aorta. Musculoskeletal: Mild degenerate change of the spine. Slight interval progression of a moderate T10 compression fracture IMPRESSION: Masslike consolidation measuring 5 x 8.2 cm over the left lower lobe. Cut off of the left lower lobe bronchus proximal to this masslike density. Subtle adjacent patchy airspace density in the left lower lobe as well as tiny amount of left pleural fluid. Suggestion of left hilar adenopathy, although difficult to evaluate without intravenous contrast. Findings are worrisome for a neoplastic process and less likely infection or inflammatory process. Given patient's infectious clinical presentation, a short interval follow-up CT in 4 weeks may be helpful. If findings unchanged than recommend thoracic surgery consultation. Indeterminate left adrenal mass as seen on the previous exam exams as difficult to assess change as it is incompletely imaged on this exam. Minimal atherosclerotic coronary artery disease. Aortic Atherosclerosis (ICD10-I70.0). Slight interval progression of patient's known moderate T10 compression fracture. Electronically Signed   By: Marin Olp M.D.   On: 05/14/2018 00:58        Scheduled Meds: . aspirin EC  81 mg  Oral Daily  . enoxaparin (LOVENOX) injection  40 mg Subcutaneous Q24H  . fenofibrate  160 mg Oral Daily  . fluticasone  2 spray Each Nare Daily  . fluticasone furoate-vilanterol  1 puff Inhalation Daily   And  . umeclidinium bromide  1 puff Inhalation Daily  . hydrALAZINE  25 mg Oral TID  . insulin aspart  0-15 Units Subcutaneous TID WC  . insulin aspart  0-5 Units Subcutaneous QHS  . insulin regular human CONCENTRATED  40 Units Subcutaneous TID WC  . isosorbide dinitrate  20 mg Oral TID  . loratadine  10 mg Oral Daily   Continuous Infusions: . levofloxacin (LEVAQUIN) IV       LOS: 1 day    Time spent: 30 minutes    Aishia Barkey Darleen Crocker, DO [facility] Pager [phone]  If 7PM-7AM, please contact night-coverage [email or web address] Password [facility] 05/14/2018, 8:34 AM

## 2018-05-14 NOTE — Progress Notes (Addendum)
Pt arrived to unit via wheelchair. No O2 in place. NAD.

## 2018-05-14 NOTE — ED Notes (Signed)
PT moved from stretcher to Hospital bed at this time.

## 2018-05-14 NOTE — ED Notes (Signed)
Pt placed in hospital bed at this time

## 2018-05-14 NOTE — Progress Notes (Signed)
Called [facility] to get additional admission information from facility. Spoke to [employer]. Stated that "Butch Penny" was the only person who could give information. Left message requesting that Butch Penny call floor in AM when she arrived back to work. Flu and PNA vaccine ordered, awaiting admin due to lack of current information from facility.

## 2018-05-15 LAB — GLUCOSE, CAPILLARY
Glucose-Capillary: 171 mg/dL — ABNORMAL HIGH (ref 70–99)
Glucose-Capillary: 257 mg/dL — ABNORMAL HIGH (ref 70–99)
Glucose-Capillary: 65 mg/dL — ABNORMAL LOW (ref 70–99)
Glucose-Capillary: 91 mg/dL (ref 70–99)
Glucose-Capillary: 81 mg/dL (ref 70–99)

## 2018-05-15 LAB — PROCALCITONIN: Procalcitonin: 0.23 ng/mL

## 2018-05-15 LAB — HIV ANTIBODY (ROUTINE TESTING W REFLEX): HIV Screen 4th Generation wRfx: NONREACTIVE

## 2018-05-15 LAB — BASIC METABOLIC PANEL
Anion gap: 9 (ref 5–15)
BUN: 20 mg/dL (ref 8–23)
CO2: 22 mmol/L (ref 22–32)
Calcium: 8.7 mg/dL — ABNORMAL LOW (ref 8.9–10.3)
Chloride: 105 mmol/L (ref 98–111)
Creatinine, Ser: 1.36 mg/dL — ABNORMAL HIGH (ref 0.44–1.00)
GFR calc Af Amer: 48 mL/min — ABNORMAL LOW (ref >60)
GFR calc non Af Amer: 42 mL/min — ABNORMAL LOW (ref >60)
Glucose, Bld: 202 mg/dL — ABNORMAL HIGH (ref 70–99)
Potassium: 4.6 mmol/L (ref 3.5–5.1)
Sodium: 136 mmol/L (ref 135–145)

## 2018-05-15 LAB — CBC
HCT: 31.4 % — ABNORMAL LOW (ref 36.0–46.0)
Hemoglobin: 9.3 g/dL — ABNORMAL LOW (ref 12.0–15.0)
MCH: 29.1 pg (ref 26.0–34.0)
MCHC: 29.6 g/dL — ABNORMAL LOW (ref 30.0–36.0)
MCV: 98.1 fL (ref 80.0–100.0)
Platelets: 404 10*3/uL — ABNORMAL HIGH (ref 150–400)
RBC: 3.2 MIL/uL — ABNORMAL LOW (ref 3.87–5.11)
RDW: 13.7 % (ref 11.5–15.5)
WBC: 10.9 10*3/uL — ABNORMAL HIGH (ref 4.0–10.5)
nRBC: 0 % (ref 0.0–0.2)

## 2018-05-15 MED ORDER — LACTATED RINGERS IV SOLN
INTRAVENOUS | Status: AC
  Administered 2018-05-15: 11:00:00 via INTRAVENOUS

## 2018-05-15 MED ORDER — LACTATED RINGERS IV SOLN: INTRAVENOUS | Status: DC

## 2018-05-15 NOTE — Progress Notes (Signed)
PROGRESS NOTE    [name]  TDD:[id number] DOB: [birth year] DOA: 05/13/2018 PCP: Rosita Fire, MD   Brief Narrative:  Per HPI: [name] a 63 y.o.femalewith a history of cognitive delay, HTN, DM2, grade 1 diastolic heart failure with preserved EF (last echo 10/2016), Asthma. She presents with worsening cough with thick sputum over the past week. Also complains of fever and chills and has had episodes of vomiting. Emesis described as phlegm. Multiple other residents with flu-like symptoms. No palliating or provoking features.  Patient has been admitted with community-acquired pneumonia and started on Levaquin.  She was also noted to have some AKI which is improving.  Additionally, CT chest does appear to demonstrate a potential neoplastic process to her left lower lung base which will require repeat CT in the next 4 weeks for further evaluation.  Assessment & Plan:   Principal Problem:   CAP (community acquired pneumonia) Active Problems:   Mental handicap   Essential hypertension, benign   Acute kidney injury superimposed on chronic kidney disease ([place])   Uncontrolled type 2 diabetes mellitus with stage 4 chronic kidney disease ([place])   Morbid obesity due to excess calories ([place])  1. CAP a. Continue Levaquin and await further culture results b. Robitussin c. Strep pneumo negative d. Influenzanegative e. procalcitonin elevated f. CT chest noted with left lower lobe mass which will need further evaluation in 4 weeks with repeat after pneumonia resolution 2. DM2-with hyperglycemia a. Continue home insulin and SSI; adjust as needed 3. HTN a. Continue antihypertensives and will resume ARB  b. Will discontinue IV fluid today 4. AKI on CKD-improved a. Resume ARB  b. Resume some gentle IV fluid until dietary intake stabilizes 5. Diastolic CHF a. Compensated b. Hold Lasix for now and resume by a.m. if volume begins to rise 6. Obesity 7. Cognitive  delay   DVT prophylaxis: Lovenox Code Status: None Family Communication: Full Disposition Plan: Return to SNF after improvement on Levaquin hopefully next 24 hours.   Consultants:   None  Procedures:   None  Antimicrobials:   Levaquin 2/14->  Subjective: Patient seen and evaluated today with no new acute complaints or concerns. No acute concerns or events noted overnight.  She continues to have some nausea and cannot eat most of her meals due to stomach upset.  Objective: Vitals:   05/14/18 1531 05/14/18 2117 05/15/18 0350 05/15/18 0632  BP: (!) 173/78 (!) 155/75  (!) 154/86  Pulse: (!) 101 (!) 102  (!) 104  Resp: 20 (!) 22  19  Temp: 99.1 F (37.3 C) 98.7 F (37.1 C)  98.2 F (36.8 C)  TempSrc: Oral Oral  Oral  SpO2: 93% 93% 96% 94%  Weight: 115.6 kg     Height: 5\' 4"  (1.626 m)       Intake/Output Summary (Last 24 hours) at 05/15/2018 0855 Last data filed at 05/15/2018 0600 Gross per 24 hour  Intake 820 ml  Output 2301 ml  Net -1481 ml   Filed Weights   05/13/18 1728 05/14/18 1531  Weight: 125 kg 115.6 kg    Examination:  General exam: Appears calm and comfortable  Respiratory system: Clear to auscultation. Respiratory effort normal.  Currently on room air Cardiovascular system: S1 & S2 heard, RRR. No JVD, murmurs, rubs, gallops or clicks. No pedal edema. Gastrointestinal system: Abdomen is nondistended, soft and nontender. No organomegaly or masses felt. Normal bowel sounds heard. Central nervous system: Alert and oriented. No focal neurological deficits. Extremities: Symmetric 5  x 5 power. Skin: No rashes, lesions or ulcers Psychiatry: Judgement and insight appear normal. Mood & affect appropriate.     Data Reviewed: I have personally reviewed following labs and imaging studies  CBC: Recent Labs  Lab 05/13/18 1817 05/14/18 0614 05/15/18 0706  WBC 15.0* 15.2* 10.9*  NEUTROABS 12.4*  --   --   HGB 9.8* 9.0* 9.3*  HCT 33.2* 30.1* 31.4*   MCV 94.3 95.9 98.1  PLT 515* 412* 176*   Basic Metabolic Panel: Recent Labs  Lab 05/13/18 1817 05/14/18 0614 05/15/18 0706  NA 137 135 136  K 4.6 5.0 4.6  CL 103 102 105  CO2 23 22 22   GLUCOSE 75 185* 202*  BUN 39* 30* 20  CREATININE 1.83* 1.56* 1.36*  CALCIUM 9.0 8.4* 8.7*  MG 2.3  --   --    GFR: Estimated Creatinine Clearance: 53.6 mL/min (A) (by C-G formula based on SCr of 1.36 mg/dL (H)). Liver Function Tests: Recent Labs  Lab 05/13/18 1817  AST 29  ALT 32  ALKPHOS 93  BILITOT 0.6  PROT 8.6*  ALBUMIN 2.8*   Recent Labs  Lab 05/13/18 1817  LIPASE 24   No results for input(s): AMMONIA in the last 168 hours. Coagulation Profile: No results for input(s): INR, PROTIME in the last 168 hours. Cardiac Enzymes: No results for input(s): CKTOTAL, CKMB, CKMBINDEX, TROPONINI in the last 168 hours. BNP (last 3 results) No results for input(s): PROBNP in the last 8760 hours. HbA1C: No results for input(s): HGBA1C in the last 72 hours. CBG: Recent Labs  Lab 05/14/18 0737 05/14/18 1204 05/14/18 1650 05/14/18 2227 05/15/18 0715  GLUCAP 193* 243* 207* 193* 171*   Lipid Profile: No results for input(s): CHOL, HDL, LDLCALC, TRIG, CHOLHDL, LDLDIRECT in the last 72 hours. Thyroid Function Tests: No results for input(s): TSH, T4TOTAL, FREET4, T3FREE, THYROIDAB in the last 72 hours. Anemia Panel: No results for input(s): VITAMINB12, FOLATE, FERRITIN, TIBC, IRON, RETICCTPCT in the last 72 hours. Sepsis Labs: Recent Labs  Lab 05/13/18 1817 05/14/18 0614 05/15/18 0706  PROCALCITON 0.30 0.34 0.23  LATICACIDVEN 0.7  --   --     Recent Results (from the past 240 hour(s))  Culture, blood (Routine X 2) w Reflex to ID Panel     Status: None (Preliminary result)   Collection Time: 05/13/18  6:17 PM  Result Value Ref Range Status   Specimen Description RIGHT ANTECUBITAL  Final   Special Requests   Final    BOTTLES DRAWN AEROBIC ONLY Blood Culture adequate volume    Culture   Final    NO GROWTH 2 DAYS Performed at [facility], [address], [place], [place] [address]    Report Status PENDING  Incomplete  Culture, blood (Routine X 2) w Reflex to ID Panel     Status: None (Preliminary result)   Collection Time: 05/13/18  6:24 PM  Result Value Ref Range Status   Specimen Description LEFT ANTECUBITAL  Final   Special Requests   Final    BOTTLES DRAWN AEROBIC AND ANAEROBIC Blood Culture adequate volume   Culture   Final    NO GROWTH 2 DAYS Performed at [facility], [address] [place], [place] [address]    Report Status PENDING  Incomplete  MRSA PCR Screening     Status: None   Collection Time: 05/14/18  6:15 PM  Result Value Ref Range Status   MRSA by PCR NEGATIVE NEGATIVE Final    Comment:  The GeneXpert MRSA Assay (FDA approved for NASAL specimens only), is one component of a comprehensive MRSA colonization surveillance program. It is not intended to diagnose MRSA infection nor to guide or monitor treatment for MRSA infections. Performed at [facility], [address] [place], [place] [address]          Radiology Studies: Dg Chest 2 View  Result Date: 05/13/2018 CLINICAL DATA:  Cough with vomiting, diarrhea for 3 weeks. EXAM: CHEST - 2 VIEW COMPARISON:  Radiographs 09/03/2017.  CT 08/10/2017. FINDINGS: The heart size is stable at the upper limits of normal for AP technique. There is chronic left lower lobe opacity which has become more mass-like on today's lateral view, previously linear. The lungs are otherwise clear. There is no pleural effusion or pneumothorax. The bones appear unchanged. IMPRESSION: Increased, mass-like density surrounding the previously demonstrated linear left lower lobe atelectasis/scarring. This could be secondary to superimposed inflammation, although repeat chest CT recommended to exclude developing neoplasm. Electronically Signed   By: Richardean Sale M.D.   On: 05/13/2018 19:40   Ct Chest Wo  Contrast  Result Date: 05/14/2018 CLINICAL DATA:  Chest wall pain. No IV contrast due to poor renal function. Evaluate masslike density left lower lobe. Worsening cough over the past week. Fever and chills with episodes of vomiting. EXAM: CT CHEST WITHOUT CONTRAST TECHNIQUE: Multidetector CT imaging of the chest was performed following the standard protocol without IV contrast. COMPARISON:  Chest CT 08/10/2017 and abdominal CT 05/21/2016 FINDINGS: Cardiovascular: Heart is normal size. Calcification over the left main coronary artery. Minimal calcified plaque over the thoracic aorta. Vascular structures are otherwise unremarkable. Mediastinum/Nodes: Few shotty mediastinal lymph nodes which are more prominent than seen previously. Suggestion of left infrahilar adenopathy although difficult to assess without intravenous contrast. Remaining mediastinal structures are unremarkable. Lungs/Pleura: Lungs are adequately inflated. There is masslike consolidation over the posterior left lower lobe measuring approximately 5 x 8.2 cm. This is present over the area of previously seen linear airspace density. There is cut off of the left lower lobe bronchus entering into this region of the lower lobe just proximal to this masslike consolidation. There is minimal adjacent patchy airspace density in the left lower lobe. Tiny amount of left pleural fluid remainder of the left lung as well as the right lung are clear. Upper Abdomen: Right adrenal mass without significant change from the prior exam although difficult to evaluate without intravenous contrast as it abuts the adjacent IVC. Previous cholecystectomy. Minimal calcified plaque over the abdominal aorta. Musculoskeletal: Mild degenerate change of the spine. Slight interval progression of a moderate T10 compression fracture IMPRESSION: Masslike consolidation measuring 5 x 8.2 cm over the left lower lobe. Cut off of the left lower lobe bronchus proximal to this masslike  density. Subtle adjacent patchy airspace density in the left lower lobe as well as tiny amount of left pleural fluid. Suggestion of left hilar adenopathy, although difficult to evaluate without intravenous contrast. Findings are worrisome for a neoplastic process and less likely infection or inflammatory process. Given patient's infectious clinical presentation, a short interval follow-up CT in 4 weeks may be helpful. If findings unchanged than recommend thoracic surgery consultation. Indeterminate left adrenal mass as seen on the previous exam exams as difficult to assess change as it is incompletely imaged on this exam. Minimal atherosclerotic coronary artery disease. Aortic Atherosclerosis (ICD10-I70.0). Slight interval progression of patient's known moderate T10 compression fracture. Electronically Signed   By: Marin Olp M.D.   On: 05/14/2018 00:58  Scheduled Meds: . aspirin EC  81 mg Oral Daily  . enoxaparin (LOVENOX) injection  60 mg Subcutaneous Q24H  . fenofibrate  160 mg Oral Daily  . fluticasone  2 spray Each Nare Daily  . fluticasone furoate-vilanterol  1 puff Inhalation Daily   And  . umeclidinium bromide  1 puff Inhalation Daily  . hydrALAZINE  25 mg Oral TID  . Influenza vac split quadrivalent PF  0.5 mL Intramuscular Tomorrow-1000  . insulin aspart  0-15 Units Subcutaneous TID WC  . insulin aspart  0-5 Units Subcutaneous QHS  . insulin regular human CONCENTRATED  40 Units Subcutaneous TID WC  . isosorbide dinitrate  20 mg Oral TID  . loratadine  10 mg Oral Daily  . losartan  100 mg Oral Daily  . pneumococcal 23 valent vaccine  0.5 mL Intramuscular Tomorrow-1000   Continuous Infusions: . levofloxacin (LEVAQUIN) IV 500 mg (05/14/18 2317)     LOS: 2 days    Time spent: 30 minutes    Gaila Engebretsen Darleen Crocker, DO [facility] Pager [phone]  If 7PM-7AM, please contact night-coverage [email or web address] Password [facility] 05/15/2018, 8:55 AM

## 2018-05-15 NOTE — Progress Notes (Signed)
CRITICAL VALUE ALERT  Critical Value:  CBG   65  Date & Time Notied:  05/15/2018 @ 9678     Dinner tray at bedside.  Patient ate dinner.  Recheck of CBG postprandial was 91.

## 2018-05-16 DIAGNOSIS — Z23 Encounter for immunization: Secondary | ICD-10-CM | POA: Diagnosis not present

## 2018-05-16 LAB — BASIC METABOLIC PANEL
Anion gap: 10 (ref 5–15)
BUN: 24 mg/dL — ABNORMAL HIGH (ref 8–23)
CO2: 24 mmol/L (ref 22–32)
Calcium: 8.9 mg/dL (ref 8.9–10.3)
Chloride: 101 mmol/L (ref 98–111)
Creatinine, Ser: 1.63 mg/dL — ABNORMAL HIGH (ref 0.44–1.00)
GFR calc Af Amer: 39 mL/min — ABNORMAL LOW (ref >60)
GFR calc non Af Amer: 33 mL/min — ABNORMAL LOW (ref >60)
Glucose, Bld: 77 mg/dL (ref 70–99)
Potassium: 5.1 mmol/L (ref 3.5–5.1)
Sodium: 135 mmol/L (ref 135–145)

## 2018-05-16 LAB — CBC
HCT: 31.2 % — ABNORMAL LOW (ref 36.0–46.0)
Hemoglobin: 9.3 g/dL — ABNORMAL LOW (ref 12.0–15.0)
MCH: 28.9 pg (ref 26.0–34.0)
MCHC: 29.8 g/dL — ABNORMAL LOW (ref 30.0–36.0)
MCV: 96.9 fL (ref 80.0–100.0)
Platelets: 455 10*3/uL — ABNORMAL HIGH (ref 150–400)
RBC: 3.22 MIL/uL — ABNORMAL LOW (ref 3.87–5.11)
RDW: 13.6 % (ref 11.5–15.5)
WBC: 8.7 10*3/uL (ref 4.0–10.5)
nRBC: 0 % (ref 0.0–0.2)

## 2018-05-16 LAB — GLUCOSE, CAPILLARY
Glucose-Capillary: 83 mg/dL (ref 70–99)
Glucose-Capillary: 146 mg/dL — ABNORMAL HIGH (ref 70–99)

## 2018-05-16 MED ORDER — INSULIN REGULAR HUMAN (CONC) 500 UNIT/ML ~~LOC~~ SOPN: 20.0000 [IU] | PEN_INJECTOR | Freq: Three times a day (TID) | SUBCUTANEOUS | 2 refills | Status: DC

## 2018-05-16 MED ORDER — LEVOFLOXACIN IN D5W 250 MG/50ML IV SOLN
250.0000 mg | INTRAVENOUS | Status: DC
  Filled 2018-05-16: qty 50

## 2018-05-16 MED ORDER — GUAIFENESIN 100 MG/5ML PO SOLN: 5.0000 mL | ORAL | 0 refills | Status: DC | PRN

## 2018-05-16 MED ORDER — INSULIN ASPART 100 UNIT/ML ~~LOC~~ SOLN
0.0000 [IU] | Freq: Three times a day (TID) | SUBCUTANEOUS | Status: DC
  Administered 2018-05-16: 1 [IU] via SUBCUTANEOUS

## 2018-05-16 MED ORDER — INSULIN REGULAR HUMAN (CONC) 500 UNIT/ML ~~LOC~~ SOPN
20.0000 [IU] | PEN_INJECTOR | Freq: Three times a day (TID) | SUBCUTANEOUS | Status: DC
  Administered 2018-05-16: 20 [IU] via SUBCUTANEOUS
  Filled 2018-05-16: qty 3

## 2018-05-16 MED ORDER — LEVOFLOXACIN 500 MG PO TABS: 500.0000 mg | ORAL_TABLET | Freq: Every day | ORAL | 0 refills | Status: DC

## 2018-05-16 NOTE — Care Management (Addendum)
  CM consulted for patient being from SNF. Patient is from [facility]. CSW has also been consulted. No CM needs at this time. Will clear consult.

## 2018-05-16 NOTE — Clinical Social Work Note (Signed)
Patient is from [facility]. Butch Penny at [facility] advised of discharge and clinicals sent. A message was left for  Guardian at [place], Netta Cedars, advising of discharge.  Facility to pick patient up.  LCSW signing off.      Izzac Rockett, Clydene Pugh, LCSW

## 2018-05-16 NOTE — Discharge Summary (Signed)
Physician Discharge Summary  [name] MHW:[id number] DOB: 07/24/1955 DOA: 05/13/2018  PCP: Rosita Fire, MD  Admit date: 05/13/2018  Discharge date: 05/16/2018  Admitted From:ALF  Disposition:  ALF  Recommendations for Outpatient Follow-up:  1. Follow up with PCP in 1-2 weeks 2. Continue on Levaquin for 5 more days to finish 7-day course of treatment 3. Use Robitussin as needed for cough 4. Will need repeat CT chest in next 4 weeks for further evaluation of lung mass  Home Health: None  Equipment/Devices: None  Discharge Condition: Stable  CODE STATUS: Full  Diet recommendation: Heart Healthy/carb modified  Brief/Interim Summary: Per HPI: [name] a 63 y.o.femalewith a history of cognitive delay, HTN, DM2, grade 1 diastolic heart failure with preserved EF (last echo 10/2016), Asthma. She presents with worsening cough with thick sputum over the past week. Also complains of fever and chills and has had episodes of vomiting. Emesis described as phlegm. Multiple other residents with flu-like symptoms. No palliating or provoking features.  Patient has been admitted with community-acquired pneumonia and was started on Levaquin. She was also noted to have some AKI which is improving. Additionally, CT chest does appear to demonstrate a potential neoplastic process to her left lower lung base which will require repeat CT in the next 4 weeks for further evaluation.  She is doing much better now on this day of discharge and is stable to return to assisted living facility and will continue on Levaquin for 5 more days to finish course of treatment.  Discharge Diagnoses:  Principal Problem:   CAP (community acquired pneumonia) Active Problems:   Mental handicap   Essential hypertension, benign   Acute kidney injury superimposed on chronic kidney disease ([place])   Uncontrolled type 2 diabetes mellitus with stage 4 chronic kidney disease ([place])   Morbid obesity due to  excess calories ([place])  Principal discharge diagnosis: Community-acquired pneumonia.  Discharge Instructions  Discharge Instructions    Diet - low sodium heart healthy   Complete by:  As directed    Increase activity slowly   Complete by:  As directed      Allergies as of 05/16/2018   No Known Allergies     Medication List    STOP taking these medications   lisinopril 20 MG tablet Commonly known as:  PRINIVIL,ZESTRIL     TAKE these medications   ACCU-CHEK AVIVA PLUS test strip Generic drug:  glucose blood USE TO TEST THREE TIMES DAILY.   ACCU-CHEK SOFTCLIX LANCETS lancets USE TO TEST THREE TIMES DAILY.   aspirin EC 81 MG tablet Take 81 mg by mouth daily.   Fenofibric Acid 135 MG Cpdr Take 1 capsule by mouth daily.   fluticasone 27.5 MCG/SPRAY nasal spray Commonly known as:  VERAMYST Place 2 sprays into the nose daily.   furosemide 40 MG tablet Commonly known as:  LASIX Take 40 mg by mouth daily.   guaiFENesin 100 MG/5ML Soln Commonly known as:  ROBITUSSIN Take 5 mLs (100 mg total) by mouth every 4 (four) hours as needed for cough or to loosen phlegm.   hydrALAZINE 25 MG tablet Commonly known as:  APRESOLINE Take 25 mg by mouth 3 (three) times daily.   insulin regular human CONCENTRATED 500 UNIT/ML kwikpen Commonly known as:  HUMULIN R U-500 KWIKPEN Inject 20 Units into the skin 3 (three) times daily with meals. Inject 40 units with breakfast, 40 units with lunch, and 40 units with supper when blood glucose before food is greater than  90 mg/dL What changed:  how much to take   isosorbide dinitrate 20 MG tablet Commonly known as:  ISORDIL Take 20 mg by mouth 3 (three) times daily.   levofloxacin 500 MG tablet Commonly known as:  LEVAQUIN Take 1 tablet (500 mg total) by mouth daily for 5 days.   loratadine 10 MG tablet Commonly known as:  CLARITIN Take 10 mg by mouth daily.   losartan 100 MG tablet Commonly known as:  COZAAR Take 1 tablet by mouth  daily.   potassium chloride SA 20 MEQ tablet Commonly known as:  K-DUR,KLOR-CON daily.   SURE COMFORT PEN NEEDLES 31G X 8 MM Misc Generic drug:  Insulin Pen Needle USE AS DIRECTED WITH LEVEMIR AND NOVOLOG. UP TO FOUR TIMES DAILY.   NOVOFINE AUTOCOVER 30G X 8 MM Misc Generic drug:  Insulin Pen Needle USE AS DIRECTED FOR 3 TIMES DAILY INSULIN ADMINISTRATION.   TRELEGY ELLIPTA 100-62.5-25 MCG/INH Aepb Generic drug:  Fluticasone-Umeclidin-Vilant Inhale into the lungs daily.      Follow-up Information    Rosita Fire, MD Follow up in 1 week(s).   Specialty:  Internal Medicine Contact information: [place] [address] [phone]          No Known Allergies  Consultations:  None   Procedures/Studies: Dg Chest 2 View  Result Date: 05/13/2018 CLINICAL DATA:  Cough with vomiting, diarrhea for 3 weeks. EXAM: CHEST - 2 VIEW COMPARISON:  Radiographs 09/03/2017.  CT 08/10/2017. FINDINGS: The heart size is stable at the upper limits of normal for AP technique. There is chronic left lower lobe opacity which has become more mass-like on today's lateral view, previously linear. The lungs are otherwise clear. There is no pleural effusion or pneumothorax. The bones appear unchanged. IMPRESSION: Increased, mass-like density surrounding the previously demonstrated linear left lower lobe atelectasis/scarring. This could be secondary to superimposed inflammation, although repeat chest CT recommended to exclude developing neoplasm. Electronically Signed   By: Richardean Sale M.D.   On: 05/13/2018 19:40   Ct Chest Wo Contrast  Result Date: 05/14/2018 CLINICAL DATA:  Chest wall pain. No IV contrast due to poor renal function. Evaluate masslike density left lower lobe. Worsening cough over the past week. Fever and chills with episodes of vomiting. EXAM: CT CHEST WITHOUT CONTRAST TECHNIQUE: Multidetector CT imaging of the chest was performed following the standard  protocol without IV contrast. COMPARISON:  Chest CT 08/10/2017 and abdominal CT 05/21/2016 FINDINGS: Cardiovascular: Heart is normal size. Calcification over the left main coronary artery. Minimal calcified plaque over the thoracic aorta. Vascular structures are otherwise unremarkable. Mediastinum/Nodes: Few shotty mediastinal lymph nodes which are more prominent than seen previously. Suggestion of left infrahilar adenopathy although difficult to assess without intravenous contrast. Remaining mediastinal structures are unremarkable. Lungs/Pleura: Lungs are adequately inflated. There is masslike consolidation over the posterior left lower lobe measuring approximately 5 x 8.2 cm. This is present over the area of previously seen linear airspace density. There is cut off of the left lower lobe bronchus entering into this region of the lower lobe just proximal to this masslike consolidation. There is minimal adjacent patchy airspace density in the left lower lobe. Tiny amount of left pleural fluid remainder of the left lung as well as the right lung are clear. Upper Abdomen: Right adrenal mass without significant change from the prior exam although difficult to evaluate without intravenous contrast as it abuts the adjacent IVC. Previous cholecystectomy. Minimal calcified plaque over the abdominal aorta. Musculoskeletal: Mild degenerate  change of the spine. Slight interval progression of a moderate T10 compression fracture IMPRESSION: Masslike consolidation measuring 5 x 8.2 cm over the left lower lobe. Cut off of the left lower lobe bronchus proximal to this masslike density. Subtle adjacent patchy airspace density in the left lower lobe as well as tiny amount of left pleural fluid. Suggestion of left hilar adenopathy, although difficult to evaluate without intravenous contrast. Findings are worrisome for a neoplastic process and less likely infection or inflammatory process. Given patient's infectious clinical  presentation, a short interval follow-up CT in 4 weeks may be helpful. If findings unchanged than recommend thoracic surgery consultation. Indeterminate left adrenal mass as seen on the previous exam exams as difficult to assess change as it is incompletely imaged on this exam. Minimal atherosclerotic coronary artery disease. Aortic Atherosclerosis (ICD10-I70.0). Slight interval progression of patient's known moderate T10 compression fracture. Electronically Signed   By: Marin Olp M.D.   On: 05/14/2018 00:58     Discharge Exam: Vitals:   05/16/18 0654 05/16/18 0716  BP: 136/79   Pulse: 93   Resp: 20   Temp: 99.3 F (37.4 C)   SpO2: 93% 94%   Vitals:   05/15/18 1401 05/15/18 2055 05/16/18 0654 05/16/18 0716  BP: (!) 153/89 131/75 136/79   Pulse: (!) 105 98 93   Resp: 20 20 20    Temp: 98.4 F (36.9 C) 98.3 F (36.8 C) 99.3 F (37.4 C)   TempSrc: Oral Oral Oral   SpO2: 95% 93% 93% 94%  Weight:      Height:        General: Pt is alert, awake, not in acute distress Cardiovascular: RRR, S1/S2 +, no rubs, no gallops Respiratory: CTA bilaterally, no wheezing, no rhonchi Abdominal: Soft, NT, ND, bowel sounds + Extremities: no edema, no cyanosis    The results of significant diagnostics from this hospitalization (including imaging, microbiology, ancillary and laboratory) are listed below for reference.     Microbiology: Recent Results (from the past 240 hour(s))  Culture, blood (Routine X 2) w Reflex to ID Panel     Status: None (Preliminary result)   Collection Time: 05/13/18  6:17 PM  Result Value Ref Range Status   Specimen Description RIGHT ANTECUBITAL  Final   Special Requests   Final    BOTTLES DRAWN AEROBIC ONLY Blood Culture adequate volume   Culture   Final    NO GROWTH 3 DAYS Performed at [facility], [address], [place], [place] [address]    Report Status PENDING  Incomplete  Culture, blood (Routine X 2) w Reflex to ID Panel     Status: None (Preliminary  result)   Collection Time: 05/13/18  6:24 PM  Result Value Ref Range Status   Specimen Description LEFT ANTECUBITAL  Final   Special Requests   Final    BOTTLES DRAWN AEROBIC AND ANAEROBIC Blood Culture adequate volume   Culture   Final    NO GROWTH 3 DAYS Performed at [facility], [address], [place],  [address]    Report Status PENDING  Incomplete  MRSA PCR Screening     Status: None   Collection Time: 05/14/18  6:15 PM  Result Value Ref Range Status   MRSA by PCR NEGATIVE NEGATIVE Final    Comment:        The GeneXpert MRSA Assay (FDA approved for NASAL specimens only), is one component of a comprehensive MRSA colonization surveillance program. It is not intended to diagnose MRSA  infection nor to guide or monitor treatment for MRSA infections. Performed at [facility], [address], [place], [place] [address]   Culture, respiratory     Status: None (Preliminary result)   Collection Time: 05/15/18  4:00 AM  Result Value Ref Range Status   Specimen Description   Final    SPUTUM Performed at [facility], [address] [place], [place] [address]    Special Requests   Final    NONE Performed at [facility], [address], [place], [place] [address]    Gram Stain   Final    MODERATE WBC PRESENT,BOTH PMN AND MONONUCLEAR FEW SQUAMOUS EPITHELIAL CELLS PRESENT MODERATE GRAM NEGATIVE RODS MODERATE GRAM POSITIVE COCCI IN PAIRS FEW GRAM POSITIVE RODS Performed at [facility], [place] [address] [place],  [address]    Culture PENDING  Incomplete   Report Status PENDING  Incomplete     Labs: BNP (last 3 results) No results for input(s): BNP in the last 8760 hours. Basic Metabolic Panel: Recent Labs  Lab 05/13/18 1817 05/14/18 0614 05/15/18 0706 05/16/18 0407  NA 137 135 136 135  K 4.6 5.0 4.6 5.1  CL 103 102 105 101  CO2 23 22 22 24   GLUCOSE 75 185* 202* 77  BUN 39* 30* 20 24*  CREATININE 1.83* 1.56* 1.36* 1.63*  CALCIUM 9.0 8.4* 8.7*  8.9  MG 2.3  --   --   --    Liver Function Tests: Recent Labs  Lab 05/13/18 1817  AST 29  ALT 32  ALKPHOS 93  BILITOT 0.6  PROT 8.6*  ALBUMIN 2.8*   Recent Labs  Lab 05/13/18 1817  LIPASE 24   No results for input(s): AMMONIA in the last 168 hours. CBC: Recent Labs  Lab 05/13/18 1817 05/14/18 0614 05/15/18 0706 05/16/18 0407  WBC 15.0* 15.2* 10.9* 8.7  NEUTROABS 12.4*  --   --   --   HGB 9.8* 9.0* 9.3* 9.3*  HCT 33.2* 30.1* 31.4* 31.2*  MCV 94.3 95.9 98.1 96.9  PLT 515* 412* 404* 455*   Cardiac Enzymes: No results for input(s): CKTOTAL, CKMB, CKMBINDEX, TROPONINI in the last 168 hours. BNP: Invalid input(s): POCBNP CBG: Recent Labs  Lab 05/15/18 1103 05/15/18 1719 05/15/18 1852 05/15/18 2052 05/16/18 0737  GLUCAP 257* 65* 91 81 83   D-Dimer No results for input(s): DDIMER in the last 72 hours. Hgb A1c No results for input(s): HGBA1C in the last 72 hours. Lipid Profile No results for input(s): CHOL, HDL, LDLCALC, TRIG, CHOLHDL, LDLDIRECT in the last 72 hours. Thyroid function studies No results for input(s): TSH, T4TOTAL, T3FREE, THYROIDAB in the last 72 hours.  Invalid input(s): FREET3 Anemia work up No results for input(s): VITAMINB12, FOLATE, FERRITIN, TIBC, IRON, RETICCTPCT in the last 72 hours. Urinalysis    Component Value Date/Time   COLORURINE YELLOW 05/14/2018 1200   APPEARANCEUR CLEAR 05/14/2018 1200   LABSPEC 1.009 05/14/2018 1200   PHURINE 5.0 05/14/2018 1200   GLUCOSEU 50 (A) 05/14/2018 1200   HGBUR NEGATIVE 05/14/2018 1200   BILIRUBINUR NEGATIVE 05/14/2018 1200   KETONESUR NEGATIVE 05/14/2018 1200   PROTEINUR NEGATIVE 05/14/2018 1200   UROBILINOGEN 0.2 02/01/2013 1145   NITRITE NEGATIVE 05/14/2018 1200   LEUKOCYTESUR NEGATIVE 05/14/2018 1200   Sepsis Labs Invalid input(s): PROCALCITONIN,  WBC,  LACTICIDVEN Microbiology Recent Results (from the past 240 hour(s))  Culture, blood (Routine X 2) w Reflex to ID Panel     Status:  None (Preliminary result)  Collection Time: 05/13/18  6:17 PM  Result Value Ref Range Status   Specimen Description RIGHT ANTECUBITAL  Final   Special Requests   Final    BOTTLES DRAWN AEROBIC ONLY Blood Culture adequate volume   Culture   Final    NO GROWTH 3 DAYS Performed at [facility], [address], [place], [place] [address]    Report Status PENDING  Incomplete  Culture, blood (Routine X 2) w Reflex to ID Panel     Status: None (Preliminary result)   Collection Time: 05/13/18  6:24 PM  Result Value Ref Range Status   Specimen Description LEFT ANTECUBITAL  Final   Special Requests   Final    BOTTLES DRAWN AEROBIC AND ANAEROBIC Blood Culture adequate volume   Culture   Final    NO GROWTH 3 DAYS Performed at [facility], [address] [place], [place] [address]    Report Status PENDING  Incomplete  MRSA PCR Screening     Status: None   Collection Time: 05/14/18  6:15 PM  Result Value Ref Range Status   MRSA by PCR NEGATIVE NEGATIVE Final    Comment:        The GeneXpert MRSA Assay (FDA approved for NASAL specimens only), is one component of a comprehensive MRSA colonization surveillance program. It is not intended to diagnose MRSA infection nor to guide or monitor treatment for MRSA infections. Performed at [facility], [address] [place], [place] [address]   Culture, respiratory     Status: None (Preliminary result)   Collection Time: 05/15/18  4:00 AM  Result Value Ref Range Status   Specimen Description   Final    SPUTUM Performed at [facility], [address], [place], [place] [address]    Special Requests   Final    NONE Performed at [facility], [address] [place], [place] [address]    Gram Stain   Final    MODERATE WBC PRESENT,BOTH PMN AND MONONUCLEAR FEW SQUAMOUS EPITHELIAL CELLS PRESENT MODERATE GRAM NEGATIVE RODS MODERATE GRAM POSITIVE COCCI IN PAIRS FEW GRAM POSITIVE RODS Performed at [facility], [place] [address],  [place], [place] [address]    Culture PENDING  Incomplete   Report Status PENDING  Incomplete     Time coordinating discharge: 35 minutes  SIGNED:   Rodena Goldmann, DO [facility] 05/16/2018, 9:50 AM  If 7PM-7AM, please contact night-coverage [email or web address] Password [email or web address]

## 2018-05-16 NOTE — Progress Notes (Signed)
Discharge instructions reviewed with patient. Copy of AVS sent with patient to [facility]. Verbalized understanding of instructions, follow-up with PCP, and that prescriptions have been sent to [facility]. IV site d/c'd by nurse tech, site within normal limits. Pt left floor in stable condition via w/c accompanied by nurse tech and [facility] ALF staff. Donavan Foil, RN

## 2018-05-16 NOTE — Care Management Important Message (Signed)
Important Message  Patient Details  Name: [name] MRN: [id number] Date of Birth: [birth year]   Medicare Important Message Given:  Yes    Tommy Medal 05/16/2018, 4:18 PM

## 2018-05-16 NOTE — NC FL2 (Signed)
[place] LEVEL OF CARE SCREENING TOOL     IDENTIFICATION  Patient Name: [name] Birthdate: 29-Mar-1956 Sex: female Admission Date (Current Location): 05/13/2018  [facility] and [place] Number:  [employer] and Address:  [place] [address], [place]      Provider Number: [id number]  Attending Physician Name and Address:  Rodena Goldmann, DO  Relative Name and Phone Number:       Current Level of Care: Hospital Recommended Level of Care: [place] Prior Approval Number:    Date Approved/Denied:   PASRR Number:    Discharge Plan: Domiciliary (Rest home)(ALF)    Current Diagnoses: Patient Active Problem List   Diagnosis Date Noted  . CAP (community acquired pneumonia) 05/13/2018  . Mixed hyperlipidemia 11/23/2017  . Hypercortisolemia ([place]) 04/27/2016  . Morbid obesity due to excess calories ([place]) 07/02/2015  . Uncontrolled type 2 diabetes mellitus with stage 4 chronic kidney disease ([place]) 02/26/2015  . Acute kidney injury superimposed on chronic kidney disease ([place]) 10/07/2011  . Cutaneous candidiasis 10/21/2010  . Essential hypertension, benign 10/21/2010  . Iron deficiency anemia 10/16/2010  . Mental handicap 10/16/2010    Orientation RESPIRATION BLADDER Height & Weight     Self, Time, Situation, Place  Normal Incontinent Weight: 254 lb 13.6 oz (115.6 kg) Height:  5\' 4"  (162.6 cm)  BEHAVIORAL SYMPTOMS/MOOD NEUROLOGICAL BOWEL NUTRITION STATUS      Incontinent (Low sodium/heart healthy (at facility patient is on reduced concentrated sweets diet no added salt at table))  AMBULATORY STATUS COMMUNICATION OF NEEDS Skin   Independent Verbally Normal                       Personal Care Assistance Level of Assistance  Bathing, Feeding, Dressing Bathing Assistance: Maximum assistance Feeding assistance: Independent Dressing Assistance: Maximum assistance     Functional Limitations Info   Sight, Hearing, Speech Sight Info: Adequate Hearing Info: Adequate Speech Info: Adequate    SPECIAL CARE FACTORS FREQUENCY                       Contractures Contractures Info: Not present    Additional Factors Info  Code Status, Allergies Code Status Info: Full Code Allergies Info: NKA           Current Medications (05/16/2018):  This is the current hospital active medication list Current Facility-Administered Medications  Medication Dose Route Frequency Provider Last Rate Last Dose  . acetaminophen (TYLENOL) tablet 650 mg  650 mg Oral Q6H PRN Lovey Newcomer T, NP   650 mg at 05/14/18 2118  . aspirin EC tablet 81 mg  81 mg Oral Daily Truett Mainland, DO   81 mg at 05/16/18 1041  . enoxaparin (LOVENOX) injection 60 mg  60 mg Subcutaneous Q24H Manuella Ghazi, Pratik D, DO   60 mg at 05/15/18 2230  . fenofibrate tablet 160 mg  160 mg Oral Daily Truett Mainland, DO   160 mg at 05/16/18 1042  . fluticasone (FLONASE) 50 MCG/ACT nasal spray 2 spray  2 spray Each Nare Daily Truett Mainland, DO   2 spray at 05/16/18 1043  . fluticasone furoate-vilanterol (BREO ELLIPTA) 100-25 MCG/INH 1 puff  1 puff Inhalation Daily Shah, Pratik D, DO       And  . umeclidinium bromide (INCRUSE ELLIPTA) 62.5 MCG/INH 1 puff  1 puff Inhalation Daily Shah, Pratik D, DO   1 puff at  05/16/18 0716  . guaiFENesin (ROBITUSSIN) 100 MG/5ML solution 100 mg  5 mL Oral Q4H PRN Manuella Ghazi, Pratik D, DO   100 mg at 05/15/18 2230  . hydrALAZINE (APRESOLINE) tablet 25 mg  25 mg Oral TID Truett Mainland, DO   25 mg at 05/16/18 1041  . insulin aspart (novoLOG) injection 0-5 Units  0-5 Units Subcutaneous QHS Stinson, Jacob J, DO      . insulin aspart (novoLOG) injection 0-9 Units  0-9 Units Subcutaneous TID WC Shah, Pratik D, DO   1 Units at 05/16/18 1133  . insulin regular human CONCENTRATED (HUMULIN R) 500 UNIT/ML kwikpen 20 Units  20 Units Subcutaneous TID WC Shah, Pratik D, DO   20 Units at 05/16/18 1134  . iohexol (OMNIPAQUE)  300 MG/ML solution 75 mL  75 mL Intravenous Once PRN Truett Mainland, DO      . isosorbide dinitrate (ISORDIL) tablet 20 mg  20 mg Oral TID Truett Mainland, DO   20 mg at 05/16/18 1043  . levofloxacin (LEVAQUIN) IVPB 500 mg  500 mg Intravenous Q24H Heath Lark D, DO 100 mL/hr at 05/15/18 2349 500 mg at 05/15/18 2349  . loratadine (CLARITIN) tablet 10 mg  10 mg Oral Daily Truett Mainland, DO   10 mg at 05/16/18 1042  . losartan (COZAAR) tablet 100 mg  100 mg Oral Daily Manuella Ghazi, Pratik D, DO   100 mg at 05/16/18 1042  . ondansetron (ZOFRAN) injection 4 mg  4 mg Intravenous Q6H PRN Truett Mainland, DO         Discharge Medications: TAKE these medications   ACCU-CHEK AVIVA PLUS test strip Generic drug:  glucose blood USE TO TEST THREE TIMES DAILY.   ACCU-CHEK SOFTCLIX LANCETS lancets USE TO TEST THREE TIMES DAILY.   aspirin EC 81 MG tablet Take 81 mg by mouth daily.   Fenofibric Acid 135 MG Cpdr Take 1 capsule by mouth daily.   fluticasone 27.5 MCG/SPRAY nasal spray Commonly known as:  VERAMYST Place 2 sprays into the nose daily.   furosemide 40 MG tablet Commonly known as:  LASIX Take 40 mg by mouth daily.   guaiFENesin 100 MG/5ML Soln Commonly known as:  ROBITUSSIN Take 5 mLs (100 mg total) by mouth every 4 (four) hours as needed for cough or to loosen phlegm.   hydrALAZINE 25 MG tablet Commonly known as:  APRESOLINE Take 25 mg by mouth 3 (three) times daily.   insulin regular human CONCENTRATED 500 UNIT/ML kwikpen Commonly known as:  HUMULIN R U-500 KWIKPEN Inject 20 Units into the skin 3 (three) times daily with meals. Inject 40 units with breakfast, 40 units with lunch, and 40 units with supper when blood glucose before food is greater than 90 mg/dL What changed:  how much to take   isosorbide dinitrate 20 MG tablet Commonly known as:  ISORDIL Take 20 mg by mouth 3 (three) times daily.   levofloxacin 500 MG tablet Commonly known as:  LEVAQUIN Take 1  tablet (500 mg total) by mouth daily for 5 days.   loratadine 10 MG tablet Commonly known as:  CLARITIN Take 10 mg by mouth daily.   losartan 100 MG tablet Commonly known as:  COZAAR Take 1 tablet by mouth daily.   potassium chloride SA 20 MEQ tablet Commonly known as:  K-DUR,KLOR-CON daily.   SURE COMFORT PEN NEEDLES 31G X 8 MM Misc Generic drug:  Insulin Pen Needle USE AS DIRECTED WITH LEVEMIR AND NOVOLOG. UP TO FOUR  TIMES DAILY.   NOVOFINE AUTOCOVER 30G X 8 MM Misc Generic drug:  Insulin Pen Needle USE AS DIRECTED FOR 3 TIMES DAILY INSULIN ADMINISTRATION.   TRELEGY ELLIPTA 100-62.5-25 MCG/INH Aepb Generic drug:  Fluticasone-Umeclidin-Vilant Inhale into the lungs daily.    Relevant Imaging Results:  Relevant Lab Results:   Additional Information    Calvin Jablonowski, Clydene Pugh, LCSW

## 2018-05-16 NOTE — Progress Notes (Signed)
Inpatient Diabetes Program Recommendations  AACE/ADA: New Consensus Statement on Inpatient Glycemic Control (2015)  Target Ranges:  Prepandial:   less than 140 mg/dL      Peak postprandial:   less than 180 mg/dL (1-2 hours)      Critically ill patients:  140 - 180 mg/dL   Lab Results  Component Value Date   GLUCAP 83 05/16/2018   HGBA1C 7.4 (H) 02/16/2018    Review of Glycemic Control Results for [name], [name] (MRN [id number]) as of 05/16/2018 09:19  Ref. Range 05/15/2018 11:03 05/15/2018 17:19 05/15/2018 18:52 05/15/2018 20:52 05/16/2018 07:37  Glucose-Capillary Latest Ref Range: 70 - 99 mg/dL 257 (H) 65 (L) 91 81 83   Diabetes history: DM2 Outpatient Diabetes medications: U-500 40 units tid with meals Current orders for Inpatient glycemic control: U-500 40 units tid +Novolog moderate correction tid  Inpatient Diabetes Program Recommendations:   While in the hospital: Decrease U-500 to 20 units tid Decrease Novolog correction to sensitive tid + hs  Thank you, Nani Gasser. Jerrelle Michelsen, RN, MSN, CDE  Diabetes Coordinator Inpatient Glycemic Control Team Team Pager [phone] (8am-5pm) 05/16/2018 9:21 AM

## 2018-05-17 DIAGNOSIS — R296 Repeated falls: Secondary | ICD-10-CM | POA: Diagnosis not present

## 2018-05-17 DIAGNOSIS — J449 Chronic obstructive pulmonary disease, unspecified: Secondary | ICD-10-CM | POA: Diagnosis not present

## 2018-05-17 DIAGNOSIS — M6281 Muscle weakness (generalized): Secondary | ICD-10-CM | POA: Diagnosis not present

## 2018-05-17 LAB — CULTURE, RESPIRATORY W GRAM STAIN: Culture: NORMAL

## 2018-05-18 LAB — CULTURE, BLOOD (ROUTINE X 2)
Culture: NO GROWTH
Culture: NO GROWTH
Special Requests: ADEQUATE
Special Requests: ADEQUATE

## 2018-05-19 DIAGNOSIS — N183 Chronic kidney disease, stage 3 (moderate): Secondary | ICD-10-CM | POA: Diagnosis not present

## 2018-05-19 DIAGNOSIS — J189 Pneumonia, unspecified organism: Secondary | ICD-10-CM | POA: Diagnosis not present

## 2018-05-19 DIAGNOSIS — I10 Essential (primary) hypertension: Secondary | ICD-10-CM | POA: Diagnosis not present

## 2018-05-19 DIAGNOSIS — R739 Hyperglycemia, unspecified: Secondary | ICD-10-CM | POA: Diagnosis not present

## 2018-05-19 DIAGNOSIS — I110 Hypertensive heart disease with heart failure: Secondary | ICD-10-CM | POA: Diagnosis not present

## 2018-05-19 DIAGNOSIS — J449 Chronic obstructive pulmonary disease, unspecified: Secondary | ICD-10-CM | POA: Diagnosis not present

## 2018-05-19 DIAGNOSIS — E1165 Type 2 diabetes mellitus with hyperglycemia: Secondary | ICD-10-CM | POA: Diagnosis not present

## 2018-05-19 DIAGNOSIS — R296 Repeated falls: Secondary | ICD-10-CM | POA: Diagnosis not present

## 2018-05-19 DIAGNOSIS — M6281 Muscle weakness (generalized): Secondary | ICD-10-CM | POA: Diagnosis not present

## 2018-05-20 ENCOUNTER — Other Ambulatory Visit: Payer: Self-pay | Admitting: Pulmonary Disease

## 2018-05-20 DIAGNOSIS — R918 Other nonspecific abnormal finding of lung field: Secondary | ICD-10-CM

## 2018-05-21 ENCOUNTER — Inpatient Hospital Stay (HOSPITAL_COMMUNITY): Payer: Medicare Other

## 2018-05-21 ENCOUNTER — Emergency Department (HOSPITAL_COMMUNITY): Payer: Medicare Other

## 2018-05-21 ENCOUNTER — Inpatient Hospital Stay (HOSPITAL_COMMUNITY)
Admission: EM | Admit: 2018-05-21 | Discharge: 2018-06-09 | DRG: 673 | Disposition: A | Discharge status: Skilled Nursing Facility | Payer: Medicare Other | Attending: Internal Medicine | Admitting: Internal Medicine

## 2018-05-21 ENCOUNTER — Encounter (HOSPITAL_COMMUNITY): Payer: Self-pay | Admitting: Emergency Medicine

## 2018-05-21 DIAGNOSIS — A419 Sepsis, unspecified organism: Secondary | ICD-10-CM | POA: Diagnosis not present

## 2018-05-21 DIAGNOSIS — R0989 Other specified symptoms and signs involving the circulatory and respiratory systems: Secondary | ICD-10-CM | POA: Diagnosis not present

## 2018-05-21 DIAGNOSIS — I480 Paroxysmal atrial fibrillation: Secondary | ICD-10-CM | POA: Diagnosis not present

## 2018-05-21 DIAGNOSIS — E872 Acidosis: Secondary | ICD-10-CM | POA: Diagnosis not present

## 2018-05-21 DIAGNOSIS — Z87891 Personal history of nicotine dependence: Secondary | ICD-10-CM

## 2018-05-21 DIAGNOSIS — J690 Pneumonitis due to inhalation of food and vomit: Secondary | ICD-10-CM | POA: Diagnosis not present

## 2018-05-21 DIAGNOSIS — Z992 Dependence on renal dialysis: Secondary | ICD-10-CM | POA: Diagnosis not present

## 2018-05-21 DIAGNOSIS — N170 Acute kidney failure with tubular necrosis: Secondary | ICD-10-CM | POA: Diagnosis not present

## 2018-05-21 DIAGNOSIS — I132 Hypertensive heart and chronic kidney disease with heart failure and with stage 5 chronic kidney disease, or end stage renal disease: Secondary | ICD-10-CM | POA: Diagnosis not present

## 2018-05-21 DIAGNOSIS — Z4901 Encounter for fitting and adjustment of extracorporeal dialysis catheter: Secondary | ICD-10-CM | POA: Diagnosis not present

## 2018-05-21 DIAGNOSIS — Z452 Encounter for adjustment and management of vascular access device: Secondary | ICD-10-CM

## 2018-05-21 DIAGNOSIS — I358 Other nonrheumatic aortic valve disorders: Secondary | ICD-10-CM

## 2018-05-21 DIAGNOSIS — N186 End stage renal disease: Secondary | ICD-10-CM | POA: Diagnosis present

## 2018-05-21 DIAGNOSIS — R092 Respiratory arrest: Secondary | ICD-10-CM | POA: Diagnosis present

## 2018-05-21 DIAGNOSIS — I2510 Atherosclerotic heart disease of native coronary artery without angina pectoris: Secondary | ICD-10-CM | POA: Diagnosis present

## 2018-05-21 DIAGNOSIS — Z7982 Long term (current) use of aspirin: Secondary | ICD-10-CM

## 2018-05-21 DIAGNOSIS — R1310 Dysphagia, unspecified: Secondary | ICD-10-CM | POA: Diagnosis not present

## 2018-05-21 DIAGNOSIS — I330 Acute and subacute infective endocarditis: Secondary | ICD-10-CM | POA: Diagnosis not present

## 2018-05-21 DIAGNOSIS — R849 Unspecified abnormal finding in specimens from respiratory organs and thorax: Secondary | ICD-10-CM | POA: Diagnosis not present

## 2018-05-21 DIAGNOSIS — D631 Anemia in chronic kidney disease: Secondary | ICD-10-CM | POA: Diagnosis present

## 2018-05-21 DIAGNOSIS — E249 Cushing's syndrome, unspecified: Secondary | ICD-10-CM | POA: Diagnosis present

## 2018-05-21 DIAGNOSIS — I5043 Acute on chronic combined systolic (congestive) and diastolic (congestive) heart failure: Secondary | ICD-10-CM | POA: Diagnosis not present

## 2018-05-21 DIAGNOSIS — K0889 Other specified disorders of teeth and supporting structures: Secondary | ICD-10-CM | POA: Diagnosis not present

## 2018-05-21 DIAGNOSIS — N179 Acute kidney failure, unspecified: Secondary | ICD-10-CM | POA: Diagnosis not present

## 2018-05-21 DIAGNOSIS — I509 Heart failure, unspecified: Secondary | ICD-10-CM | POA: Diagnosis not present

## 2018-05-21 DIAGNOSIS — M1990 Unspecified osteoarthritis, unspecified site: Secondary | ICD-10-CM | POA: Diagnosis not present

## 2018-05-21 DIAGNOSIS — F79 Unspecified intellectual disabilities: Secondary | ICD-10-CM | POA: Diagnosis present

## 2018-05-21 DIAGNOSIS — N189 Chronic kidney disease, unspecified: Secondary | ICD-10-CM | POA: Diagnosis not present

## 2018-05-21 DIAGNOSIS — J9600 Acute respiratory failure, unspecified whether with hypoxia or hypercapnia: Secondary | ICD-10-CM

## 2018-05-21 DIAGNOSIS — J189 Pneumonia, unspecified organism: Secondary | ICD-10-CM | POA: Diagnosis not present

## 2018-05-21 DIAGNOSIS — D3A00 Benign carcinoid tumor of unspecified site: Secondary | ICD-10-CM | POA: Diagnosis present

## 2018-05-21 DIAGNOSIS — Z01810 Encounter for preprocedural cardiovascular examination: Secondary | ICD-10-CM | POA: Diagnosis not present

## 2018-05-21 DIAGNOSIS — Z8249 Family history of ischemic heart disease and other diseases of the circulatory system: Secondary | ICD-10-CM | POA: Diagnosis not present

## 2018-05-21 DIAGNOSIS — C7A098 Malignant carcinoid tumors of other sites: Secondary | ICD-10-CM | POA: Diagnosis not present

## 2018-05-21 DIAGNOSIS — J449 Chronic obstructive pulmonary disease, unspecified: Secondary | ICD-10-CM | POA: Diagnosis present

## 2018-05-21 DIAGNOSIS — E782 Mixed hyperlipidemia: Secondary | ICD-10-CM | POA: Diagnosis present

## 2018-05-21 DIAGNOSIS — G4730 Sleep apnea, unspecified: Secondary | ICD-10-CM | POA: Diagnosis not present

## 2018-05-21 DIAGNOSIS — J9602 Acute respiratory failure with hypercapnia: Secondary | ICD-10-CM | POA: Diagnosis present

## 2018-05-21 DIAGNOSIS — E875 Hyperkalemia: Secondary | ICD-10-CM | POA: Diagnosis not present

## 2018-05-21 DIAGNOSIS — Z4682 Encounter for fitting and adjustment of non-vascular catheter: Secondary | ICD-10-CM | POA: Diagnosis not present

## 2018-05-21 DIAGNOSIS — I472 Ventricular tachycardia, unspecified: Secondary | ICD-10-CM

## 2018-05-21 DIAGNOSIS — Z833 Family history of diabetes mellitus: Secondary | ICD-10-CM

## 2018-05-21 DIAGNOSIS — Z6841 Body Mass Index (BMI) 40.0 and over, adult: Secondary | ICD-10-CM | POA: Diagnosis not present

## 2018-05-21 DIAGNOSIS — J45909 Unspecified asthma, uncomplicated: Secondary | ICD-10-CM | POA: Diagnosis not present

## 2018-05-21 DIAGNOSIS — R34 Anuria and oliguria: Secondary | ICD-10-CM | POA: Diagnosis not present

## 2018-05-21 DIAGNOSIS — G4733 Obstructive sleep apnea (adult) (pediatric): Secondary | ICD-10-CM | POA: Diagnosis present

## 2018-05-21 DIAGNOSIS — R5381 Other malaise: Secondary | ICD-10-CM | POA: Diagnosis not present

## 2018-05-21 DIAGNOSIS — Z743 Need for continuous supervision: Secondary | ICD-10-CM | POA: Diagnosis not present

## 2018-05-21 DIAGNOSIS — R0689 Other abnormalities of breathing: Secondary | ICD-10-CM | POA: Diagnosis not present

## 2018-05-21 DIAGNOSIS — Z8701 Personal history of pneumonia (recurrent): Secondary | ICD-10-CM | POA: Diagnosis not present

## 2018-05-21 DIAGNOSIS — Z7951 Long term (current) use of inhaled steroids: Secondary | ICD-10-CM

## 2018-05-21 DIAGNOSIS — I10 Essential (primary) hypertension: Secondary | ICD-10-CM | POA: Diagnosis not present

## 2018-05-21 DIAGNOSIS — E871 Hypo-osmolality and hyponatremia: Secondary | ICD-10-CM | POA: Diagnosis not present

## 2018-05-21 DIAGNOSIS — Z9889 Other specified postprocedural states: Secondary | ICD-10-CM

## 2018-05-21 DIAGNOSIS — D3A090 Benign carcinoid tumor of the bronchus and lung: Secondary | ICD-10-CM

## 2018-05-21 DIAGNOSIS — Z9911 Dependence on respirator [ventilator] status: Secondary | ICD-10-CM | POA: Diagnosis not present

## 2018-05-21 DIAGNOSIS — Z794 Long term (current) use of insulin: Secondary | ICD-10-CM

## 2018-05-21 DIAGNOSIS — J9601 Acute respiratory failure with hypoxia: Secondary | ICD-10-CM | POA: Diagnosis present

## 2018-05-21 DIAGNOSIS — I129 Hypertensive chronic kidney disease with stage 1 through stage 4 chronic kidney disease, or unspecified chronic kidney disease: Secondary | ICD-10-CM | POA: Diagnosis not present

## 2018-05-21 DIAGNOSIS — E1169 Type 2 diabetes mellitus with other specified complication: Secondary | ICD-10-CM | POA: Diagnosis not present

## 2018-05-21 DIAGNOSIS — R072 Precordial pain: Secondary | ICD-10-CM | POA: Diagnosis not present

## 2018-05-21 DIAGNOSIS — R419 Unspecified symptoms and signs involving cognitive functions and awareness: Secondary | ICD-10-CM | POA: Diagnosis not present

## 2018-05-21 DIAGNOSIS — E1122 Type 2 diabetes mellitus with diabetic chronic kidney disease: Secondary | ICD-10-CM | POA: Diagnosis not present

## 2018-05-21 DIAGNOSIS — I5033 Acute on chronic diastolic (congestive) heart failure: Secondary | ICD-10-CM | POA: Diagnosis not present

## 2018-05-21 DIAGNOSIS — Q211 Atrial septal defect: Secondary | ICD-10-CM | POA: Diagnosis not present

## 2018-05-21 DIAGNOSIS — E1165 Type 2 diabetes mellitus with hyperglycemia: Secondary | ICD-10-CM | POA: Diagnosis present

## 2018-05-21 DIAGNOSIS — M6281 Muscle weakness (generalized): Secondary | ICD-10-CM | POA: Diagnosis not present

## 2018-05-21 DIAGNOSIS — D509 Iron deficiency anemia, unspecified: Secondary | ICD-10-CM | POA: Diagnosis present

## 2018-05-21 DIAGNOSIS — R296 Repeated falls: Secondary | ICD-10-CM | POA: Diagnosis not present

## 2018-05-21 DIAGNOSIS — E8770 Fluid overload, unspecified: Secondary | ICD-10-CM | POA: Diagnosis not present

## 2018-05-21 DIAGNOSIS — Z515 Encounter for palliative care: Secondary | ICD-10-CM | POA: Diagnosis not present

## 2018-05-21 DIAGNOSIS — E119 Type 2 diabetes mellitus without complications: Secondary | ICD-10-CM | POA: Diagnosis not present

## 2018-05-21 DIAGNOSIS — I4891 Unspecified atrial fibrillation: Secondary | ICD-10-CM | POA: Diagnosis not present

## 2018-05-21 DIAGNOSIS — R279 Unspecified lack of coordination: Secondary | ICD-10-CM | POA: Diagnosis not present

## 2018-05-21 DIAGNOSIS — R609 Edema, unspecified: Secondary | ICD-10-CM | POA: Diagnosis not present

## 2018-05-21 DIAGNOSIS — Z7189 Other specified counseling: Secondary | ICD-10-CM | POA: Diagnosis not present

## 2018-05-21 DIAGNOSIS — I959 Hypotension, unspecified: Secondary | ICD-10-CM | POA: Diagnosis not present

## 2018-05-21 DIAGNOSIS — R000 Tachycardia, unspecified: Secondary | ICD-10-CM | POA: Diagnosis not present

## 2018-05-21 DIAGNOSIS — R042 Hemoptysis: Secondary | ICD-10-CM | POA: Diagnosis not present

## 2018-05-21 DIAGNOSIS — R918 Other nonspecific abnormal finding of lung field: Secondary | ICD-10-CM | POA: Diagnosis not present

## 2018-05-21 DIAGNOSIS — J9690 Respiratory failure, unspecified, unspecified whether with hypoxia or hypercapnia: Secondary | ICD-10-CM | POA: Diagnosis not present

## 2018-05-21 DIAGNOSIS — R846 Abnormal cytological findings in specimens from respiratory organs and thorax: Secondary | ICD-10-CM | POA: Diagnosis not present

## 2018-05-21 HISTORY — DX: Obstructive sleep apnea (adult) (pediatric): G47.33

## 2018-05-21 HISTORY — DX: Chronic diastolic (congestive) heart failure: I50.32

## 2018-05-21 HISTORY — DX: Morbid (severe) obesity due to excess calories: E66.01

## 2018-05-21 HISTORY — DX: Type 2 diabetes mellitus with other specified complication: E66.9

## 2018-05-21 HISTORY — DX: Obesity, unspecified: E11.69

## 2018-05-21 HISTORY — DX: Type 2 diabetes mellitus without complications: E11.9

## 2018-05-21 HISTORY — DX: Chronic kidney disease, stage 3 unspecified: N18.30

## 2018-05-21 HISTORY — DX: Chronic kidney disease, stage 3 (moderate): N18.3

## 2018-05-21 HISTORY — DX: Obesity, unspecified: E66.9

## 2018-05-21 HISTORY — DX: Atherosclerotic heart disease of native coronary artery without angina pectoris: I25.10

## 2018-05-21 LAB — COMPREHENSIVE METABOLIC PANEL
ALT: 20 U/L (ref 0–44)
ALT: 38 U/L (ref 0–44)
AST: 30 U/L (ref 15–41)
AST: 93 U/L — ABNORMAL HIGH (ref 15–41)
Albumin: 2.9 g/dL — ABNORMAL LOW (ref 3.5–5.0)
Albumin: 2.5 g/dL — ABNORMAL LOW (ref 3.5–5.0)
Alkaline Phosphatase: 54 U/L (ref 38–126)
Alkaline Phosphatase: 56 U/L (ref 38–126)
Anion gap: 11 (ref 5–15)
Anion gap: 7 (ref 5–15)
BUN: 91 mg/dL — ABNORMAL HIGH (ref 8–23)
BUN: 89 mg/dL — ABNORMAL HIGH (ref 8–23)
CO2: 15 mmol/L — ABNORMAL LOW (ref 22–32)
CO2: 19 mmol/L — ABNORMAL LOW (ref 22–32)
Calcium: 8.4 mg/dL — ABNORMAL LOW (ref 8.9–10.3)
Calcium: 8.1 mg/dL — ABNORMAL LOW (ref 8.9–10.3)
Chloride: 100 mmol/L (ref 98–111)
Chloride: 101 mmol/L (ref 98–111)
Creatinine, Ser: 4.31 mg/dL — ABNORMAL HIGH (ref 0.44–1.00)
Creatinine, Ser: 4.65 mg/dL — ABNORMAL HIGH (ref 0.44–1.00)
GFR calc Af Amer: 12 mL/min — ABNORMAL LOW (ref >60)
GFR calc Af Amer: 11 mL/min — ABNORMAL LOW (ref >60)
GFR calc non Af Amer: 10 mL/min — ABNORMAL LOW (ref >60)
GFR calc non Af Amer: 9 mL/min — ABNORMAL LOW (ref >60)
Glucose, Bld: 322 mg/dL — ABNORMAL HIGH (ref 70–99)
Glucose, Bld: 391 mg/dL — ABNORMAL HIGH (ref 70–99)
Potassium: >7.5 mmol/L (ref 3.5–5.1)
Potassium: 7.5 mmol/L (ref 3.5–5.1)
Sodium: 126 mmol/L — ABNORMAL LOW (ref 135–145)
Sodium: 127 mmol/L — ABNORMAL LOW (ref 135–145)
Total Bilirubin: 0.3 mg/dL (ref 0.3–1.2)
Total Bilirubin: 0.6 mg/dL (ref 0.3–1.2)
Total Protein: 7.4 g/dL (ref 6.5–8.1)
Total Protein: 6.7 g/dL (ref 6.5–8.1)

## 2018-05-21 LAB — GLUCOSE, CAPILLARY
Glucose-Capillary: 356 mg/dL — ABNORMAL HIGH (ref 70–99)
Glucose-Capillary: 319 mg/dL — ABNORMAL HIGH (ref 70–99)
Glucose-Capillary: 189 mg/dL — ABNORMAL HIGH (ref 70–99)

## 2018-05-21 LAB — URINALYSIS, ROUTINE W REFLEX MICROSCOPIC
Bilirubin Urine: NEGATIVE
Glucose, UA: NEGATIVE mg/dL
Ketones, ur: NEGATIVE mg/dL
Leukocytes,Ua: NEGATIVE
Nitrite: NEGATIVE
Protein, ur: 100 mg/dL — AB
RBC / HPF: >50 RBC/hpf — ABNORMAL HIGH (ref 0–5)
Specific Gravity, Urine: 1.014 (ref 1.005–1.030)
pH: 5 (ref 5.0–8.0)

## 2018-05-21 LAB — POCT I-STAT EG7
Acid-base deficit: 9 mmol/L — ABNORMAL HIGH (ref 0.0–2.0)
Bicarbonate: 19.1 mmol/L — ABNORMAL LOW (ref 20.0–28.0)
Calcium, Ion: 1.2 mmol/L (ref 1.15–1.40)
HCT: 26 % — ABNORMAL LOW (ref 36.0–46.0)
Hemoglobin: 8.8 g/dL — ABNORMAL LOW (ref 12.0–15.0)
O2 Saturation: 60 %
Patient temperature: 97
Potassium: 7.5 mmol/L (ref 3.5–5.1)
Sodium: 127 mmol/L — ABNORMAL LOW (ref 135–145)
TCO2: 21 mmol/L — ABNORMAL LOW (ref 22–32)
pCO2, Ven: 52.3 mmHg (ref 44.0–60.0)
pH, Ven: 7.166 — CL (ref 7.250–7.430)
pO2, Ven: 38 mmHg (ref 32.0–45.0)

## 2018-05-21 LAB — CBC WITH DIFFERENTIAL/PLATELET
Abs Immature Granulocytes: 0.44 10*3/uL — ABNORMAL HIGH (ref 0.00–0.07)
Basophils Absolute: 0.1 10*3/uL (ref 0.0–0.1)
Basophils Relative: 1 %
Eosinophils Absolute: 0.1 10*3/uL (ref 0.0–0.5)
Eosinophils Relative: 1 %
HCT: 32.4 % — ABNORMAL LOW (ref 36.0–46.0)
Hemoglobin: 9.4 g/dL — ABNORMAL LOW (ref 12.0–15.0)
Immature Granulocytes: 4 %
Lymphocytes Relative: 12 %
Lymphs Abs: 1.4 10*3/uL (ref 0.7–4.0)
MCH: 27.8 pg (ref 26.0–34.0)
MCHC: 29 g/dL — ABNORMAL LOW (ref 30.0–36.0)
MCV: 95.9 fL (ref 80.0–100.0)
Monocytes Absolute: 0.6 10*3/uL (ref 0.1–1.0)
Monocytes Relative: 5 %
Neutro Abs: 9.3 10*3/uL — ABNORMAL HIGH (ref 1.7–7.7)
Neutrophils Relative %: 77 %
Platelets: 572 10*3/uL — ABNORMAL HIGH (ref 150–400)
RBC: 3.38 MIL/uL — ABNORMAL LOW (ref 3.87–5.11)
RDW: 14.3 % (ref 11.5–15.5)
WBC: 11.8 10*3/uL — ABNORMAL HIGH (ref 4.0–10.5)
nRBC: 0 % (ref 0.0–0.2)

## 2018-05-21 LAB — POCT I-STAT 7, (LYTES, BLD GAS, ICA,H+H)
Acid-base deficit: 4 mmol/L — ABNORMAL HIGH (ref 0.0–2.0)
Bicarbonate: 22.3 mmol/L (ref 20.0–28.0)
Calcium, Ion: 1.12 mmol/L — ABNORMAL LOW (ref 1.15–1.40)
HCT: 24 % — ABNORMAL LOW (ref 36.0–46.0)
Hemoglobin: 8.2 g/dL — ABNORMAL LOW (ref 12.0–15.0)
O2 Saturation: 99 %
Patient temperature: 98.6
Potassium: 7 mmol/L (ref 3.5–5.1)
Sodium: 129 mmol/L — ABNORMAL LOW (ref 135–145)
TCO2: 24 mmol/L (ref 22–32)
pCO2 arterial: 46.1 mmHg (ref 32.0–48.0)
pH, Arterial: 7.293 — ABNORMAL LOW (ref 7.350–7.450)
pO2, Arterial: 169 mmHg — ABNORMAL HIGH (ref 83.0–108.0)

## 2018-05-21 LAB — BLOOD GAS, ARTERIAL
Acid-base deficit: 10.9 mmol/L — ABNORMAL HIGH (ref 0.0–2.0)
Bicarbonate: 15.2 mmol/L — ABNORMAL LOW (ref 20.0–28.0)
FIO2: 50
O2 Saturation: 94.1 %
PEEP: 5 cmH2O
Patient temperature: 36.4
RATE: 18 resp/min
pCO2 arterial: 48.6 mmHg — ABNORMAL HIGH (ref 32.0–48.0)
pH, Arterial: 7.152 — CL (ref 7.350–7.450)
pO2, Arterial: 90.5 mmHg (ref 83.0–108.0)

## 2018-05-21 LAB — PROCALCITONIN: Procalcitonin: 0.56 ng/mL

## 2018-05-21 LAB — TROPONIN I: Troponin I: <0.03 ng/mL (ref <0.03)

## 2018-05-21 LAB — LACTIC ACID, PLASMA
Lactic Acid, Venous: 3.2 mmol/L (ref 0.5–1.9)
Lactic Acid, Venous: 1.7 mmol/L (ref 0.5–1.9)

## 2018-05-21 LAB — BRAIN NATRIURETIC PEPTIDE: B Natriuretic Peptide: 125 pg/mL — ABNORMAL HIGH (ref 0.0–100.0)

## 2018-05-21 LAB — CBG MONITORING, ED: Glucose-Capillary: 341 mg/dL — ABNORMAL HIGH (ref 70–99)

## 2018-05-21 MED ORDER — MIDAZOLAM 50MG/50ML (1MG/ML) PREMIX INFUSION: 0.5000 mg/h | INTRAVENOUS | Status: DC

## 2018-05-21 MED ORDER — SODIUM CHLORIDE 0.9 % IV SOLN
1.0000 g | Freq: Once | INTRAVENOUS | Status: AC
  Administered 2018-05-21: 1 g via INTRAVENOUS

## 2018-05-21 MED ORDER — INSULIN ASPART 100 UNIT/ML ~~LOC~~ SOLN
SUBCUTANEOUS | Status: AC
  Filled 2018-05-21: qty 1

## 2018-05-21 MED ORDER — HEPARIN SODIUM (PORCINE) 5000 UNIT/ML IJ SOLN
5000.0000 [IU] | Freq: Three times a day (TID) | INTRAMUSCULAR | Status: DC
  Administered 2018-05-21 – 2018-05-29 (×24): 5000 [IU] via SUBCUTANEOUS
  Filled 2018-05-21 (×24): qty 1

## 2018-05-21 MED ORDER — SODIUM CHLORIDE 0.9 % IV SOLN
2.0000 g | Freq: Once | INTRAVENOUS | Status: AC
  Administered 2018-05-21: 2 g via INTRAVENOUS
  Filled 2018-05-21: qty 2

## 2018-05-21 MED ORDER — SODIUM CHLORIDE 0.9 % FOR CRRT
INTRAVENOUS_CENTRAL | Status: DC | PRN
  Filled 2018-05-21: qty 1000

## 2018-05-21 MED ORDER — DEXTROSE 5 % IV SOLN
300.0000 mg | Freq: Once | INTRAVENOUS | Status: AC
  Administered 2018-05-21: 300 mg via INTRAVENOUS

## 2018-05-21 MED ORDER — SODIUM BICARBONATE 8.4 % IV SOLN
50.0000 meq | Freq: Once | INTRAVENOUS | Status: AC
  Administered 2018-05-21: 50 meq via INTRAVENOUS
  Filled 2018-05-21: qty 50

## 2018-05-21 MED ORDER — FENTANYL 2500MCG IN NS 250ML (10MCG/ML) PREMIX INFUSION
0.0000 ug/h | INTRAVENOUS | Status: DC
  Administered 2018-05-21: 25 ug/h via INTRAVENOUS

## 2018-05-21 MED ORDER — PROPOFOL 1000 MG/100ML IV EMUL
5.0000 ug/kg/min | INTRAVENOUS | Status: DC
  Administered 2018-05-21: 5 ug/kg/min via INTRAVENOUS
  Administered 2018-05-21: 50 mg via INTRAVENOUS

## 2018-05-21 MED ORDER — SODIUM BICARBONATE 8.4 % IV SOLN
INTRAVENOUS | Status: AC
  Administered 2018-05-21: 100 meq via INTRAVENOUS
  Filled 2018-05-21: qty 100

## 2018-05-21 MED ORDER — SUCCINYLCHOLINE CHLORIDE 20 MG/ML IJ SOLN
INTRAMUSCULAR | Status: AC | PRN
  Administered 2018-05-21: 150 mg via INTRAVENOUS
  Administered 2018-05-21 (×2): 50 mg via INTRAVENOUS

## 2018-05-21 MED ORDER — ARFORMOTEROL TARTRATE 15 MCG/2ML IN NEBU
15.0000 ug | INHALATION_SOLUTION | Freq: Two times a day (BID) | RESPIRATORY_TRACT | Status: DC
  Administered 2018-05-22 – 2018-06-08 (×33): 15 ug via RESPIRATORY_TRACT
  Filled 2018-05-21 (×39): qty 2

## 2018-05-21 MED ORDER — IPRATROPIUM-ALBUTEROL 0.5-2.5 (3) MG/3ML IN SOLN
3.0000 mL | Freq: Four times a day (QID) | RESPIRATORY_TRACT | Status: DC
  Administered 2018-05-22 – 2018-05-28 (×25): 3 mL via RESPIRATORY_TRACT
  Filled 2018-05-21 (×24): qty 3

## 2018-05-21 MED ORDER — SODIUM BICARBONATE 8.4 % IV SOLN
100.0000 meq | Freq: Once | INTRAVENOUS | Status: AC
  Administered 2018-05-21: 100 meq via INTRAVENOUS

## 2018-05-21 MED ORDER — ALTEPLASE 2 MG IJ SOLR
2.0000 mg | Freq: Once | INTRAMUSCULAR | Status: DC | PRN
  Filled 2018-05-21: qty 2

## 2018-05-21 MED ORDER — SODIUM CHLORIDE 0.9 % IV BOLUS
1000.0000 mL | Freq: Once | INTRAVENOUS | Status: AC
  Administered 2018-05-21: 1000 mL via INTRAVENOUS

## 2018-05-21 MED ORDER — PRISMASOL BGK 0/2.5 32-2.5 MEQ/L IV SOLN
INTRAVENOUS | Status: DC
  Administered 2018-05-21: 22:00:00 via INTRAVENOUS_CENTRAL
  Filled 2018-05-21 (×3): qty 5000

## 2018-05-21 MED ORDER — IPRATROPIUM-ALBUTEROL 0.5-2.5 (3) MG/3ML IN SOLN
3.0000 mL | Freq: Once | RESPIRATORY_TRACT | Status: AC
  Administered 2018-05-21: 3 mL via RESPIRATORY_TRACT
  Filled 2018-05-21: qty 3

## 2018-05-21 MED ORDER — VANCOMYCIN HCL IN DEXTROSE 1-5 GM/200ML-% IV SOLN: 1000.0000 mg | INTRAVENOUS | Status: DC

## 2018-05-21 MED ORDER — FENTANYL 2500MCG IN NS 250ML (10MCG/ML) PREMIX INFUSION
INTRAVENOUS | Status: AC
  Filled 2018-05-21: qty 250

## 2018-05-21 MED ORDER — PROPOFOL 10 MG/ML IV BOLUS
INTRAVENOUS | Status: AC
  Administered 2018-05-21: 50 mg via INTRAVENOUS
  Filled 2018-05-21: qty 20

## 2018-05-21 MED ORDER — HEPARIN SODIUM (PORCINE) 1000 UNIT/ML DIALYSIS
1000.0000 [IU] | INTRAMUSCULAR | Status: DC | PRN
  Administered 2018-05-22: 2200 [IU] via INTRAVENOUS_CENTRAL
  Filled 2018-05-21 (×2): qty 6

## 2018-05-21 MED ORDER — INSULIN ASPART 100 UNIT/ML ~~LOC~~ SOLN
10.0000 [IU] | Freq: Once | SUBCUTANEOUS | Status: AC
  Administered 2018-05-21: 10 [IU] via INTRAVENOUS

## 2018-05-21 MED ORDER — PROPOFOL 1000 MG/100ML IV EMUL: 5.0000 ug/kg/min | INTRAVENOUS | Status: DC

## 2018-05-21 MED ORDER — SODIUM ZIRCONIUM CYCLOSILICATE 5 G PO PACK
10.0000 g | PACK | Freq: Once | ORAL | Status: AC
  Administered 2018-05-21: 10 g via ORAL
  Filled 2018-05-21: qty 2

## 2018-05-21 MED ORDER — FENTANYL 2500MCG IN NS 250ML (10MCG/ML) PREMIX INFUSION
25.0000 ug/h | INTRAVENOUS | Status: DC
  Administered 2018-05-22 – 2018-05-23 (×2): 100 ug/h via INTRAVENOUS
  Filled 2018-05-21 (×3): qty 250

## 2018-05-21 MED ORDER — BUDESONIDE 0.5 MG/2ML IN SUSP
0.5000 mg | Freq: Two times a day (BID) | RESPIRATORY_TRACT | Status: DC
  Administered 2018-05-22 – 2018-06-08 (×33): 0.5 mg via RESPIRATORY_TRACT
  Filled 2018-05-21 (×40): qty 2

## 2018-05-21 MED ORDER — DEXTROSE 50 % IV SOLN
1.0000 | Freq: Once | INTRAVENOUS | Status: AC
  Administered 2018-05-21: 50 mL via INTRAVENOUS

## 2018-05-21 MED ORDER — FENTANYL BOLUS VIA INFUSION
50.0000 ug | INTRAVENOUS | Status: DC | PRN
  Administered 2018-05-23: 50 ug via INTRAVENOUS
  Filled 2018-05-21: qty 50

## 2018-05-21 MED ORDER — VANCOMYCIN HCL IN DEXTROSE 1-5 GM/200ML-% IV SOLN
1000.0000 mg | Freq: Once | INTRAVENOUS | Status: AC
  Administered 2018-05-21: 1000 mg via INTRAVENOUS
  Filled 2018-05-21: qty 200

## 2018-05-21 MED ORDER — SODIUM CHLORIDE 0.9 % IV SOLN
INTRAVENOUS | Status: DC
  Administered 2018-05-21: 18:00:00 via INTRAVENOUS
  Filled 2018-05-21: qty 50

## 2018-05-21 MED ORDER — DEXTROSE 50 % IV SOLN
INTRAVENOUS | Status: AC
  Filled 2018-05-21: qty 50

## 2018-05-21 MED ORDER — SODIUM CHLORIDE 0.9 % IV SOLN
1.0000 g | Freq: Once | INTRAVENOUS | Status: DC
  Filled 2018-05-21: qty 10

## 2018-05-21 MED ORDER — PRISMASOL BGK 0/2.5 32-2.5 MEQ/L IV SOLN
INTRAVENOUS | Status: DC
  Administered 2018-05-21 – 2018-05-22 (×7): via INTRAVENOUS_CENTRAL
  Filled 2018-05-21 (×12): qty 5000

## 2018-05-21 MED ORDER — VANCOMYCIN HCL IN DEXTROSE 1-5 GM/200ML-% IV SOLN
1000.0000 mg | Freq: Once | INTRAVENOUS | Status: DC
  Filled 2018-05-21: qty 200

## 2018-05-21 MED ORDER — PROPOFOL 1000 MG/100ML IV EMUL
INTRAVENOUS | Status: AC
  Filled 2018-05-21: qty 100

## 2018-05-21 MED ORDER — MIDAZOLAM HCL 50 MG/10ML IJ SOLN
INTRAMUSCULAR | Status: AC
  Filled 2018-05-21: qty 1

## 2018-05-21 MED ORDER — INSULIN ASPART 100 UNIT/ML IV SOLN
10.0000 [IU] | Freq: Once | INTRAVENOUS | Status: AC
  Administered 2018-05-21: 10 [IU] via INTRAVENOUS

## 2018-05-21 MED ORDER — CALCIUM GLUCONATE-NACL 1-0.675 GM/50ML-% IV SOLN
INTRAVENOUS | Status: AC
  Administered 2018-05-21: 1000 mg
  Filled 2018-05-21: qty 50

## 2018-05-21 MED ORDER — FAMOTIDINE 40 MG/5ML PO SUSR
20.0000 mg | Freq: Every day | ORAL | Status: DC
  Administered 2018-05-22 – 2018-05-26 (×5): 20 mg
  Filled 2018-05-21 (×7): qty 2.5

## 2018-05-21 MED ORDER — CALCIUM GLUCONATE 10 % IV SOLN
INTRAVENOUS | Status: AC
  Filled 2018-05-21: qty 10

## 2018-05-21 MED ORDER — PRISMASOL BGK 0/2.5 32-2.5 MEQ/L IV SOLN
INTRAVENOUS | Status: DC
  Administered 2018-05-21: 22:00:00 via INTRAVENOUS_CENTRAL
  Filled 2018-05-21 (×2): qty 5000

## 2018-05-21 MED ORDER — INSULIN ASPART 100 UNIT/ML ~~LOC~~ SOLN
2.0000 [IU] | SUBCUTANEOUS | Status: DC
  Administered 2018-05-21 – 2018-05-22 (×2): 4 [IU] via SUBCUTANEOUS
  Administered 2018-05-22: 2 [IU] via SUBCUTANEOUS
  Administered 2018-05-22: 3 [IU] via SUBCUTANEOUS
  Administered 2018-05-22 – 2018-05-24 (×9): 2 [IU] via SUBCUTANEOUS
  Administered 2018-05-24: 4 [IU] via SUBCUTANEOUS
  Administered 2018-05-24: 2 [IU] via SUBCUTANEOUS
  Administered 2018-05-25 (×4): 4 [IU] via SUBCUTANEOUS
  Administered 2018-05-26: 2 [IU] via SUBCUTANEOUS
  Administered 2018-05-26: 4 [IU] via SUBCUTANEOUS
  Administered 2018-05-26: 2 [IU] via SUBCUTANEOUS
  Administered 2018-05-26: 4 [IU] via SUBCUTANEOUS
  Administered 2018-05-26: 2 [IU] via SUBCUTANEOUS
  Administered 2018-05-26: 4 [IU] via SUBCUTANEOUS
  Administered 2018-05-27 (×4): 2 [IU] via SUBCUTANEOUS
  Administered 2018-05-27 – 2018-05-28 (×3): 4 [IU] via SUBCUTANEOUS

## 2018-05-21 NOTE — ED Notes (Signed)
DSS supervisor Jolene Schimke to be notified of any changes or for consent.  Her number is [phone].

## 2018-05-21 NOTE — Procedures (Signed)
Hemodialysis Catheter Insertion Procedure Note [name] [id number] 09/07/1955  Procedure: Insertion of Hemodialysis Catheter Indications: Dialysis Access   Procedure Details Consent: Unable to obtain consent because of emergent medical necessity. Time Out: Verified patient identification, verified procedure, site/side was marked, verified correct patient position, special equipment/implants available, medications/allergies/relevent history reviewed, required imaging and test results available.  Performed  Maximum sterile technique was used including antiseptics, cap, gloves, gown, hand hygiene, mask and sheet. Skin prep: Chlorhexidine; local anesthetic administered Triple lumen hemodialysis catheter was inserted into right internal jugular vein using the Seldinger technique.  Evaluation Blood flow good Complications: No apparent complications Patient did tolerate procedure well. Chest X-ray ordered to verify placement.  CXR: pending.  Hayden Pedro, AGACNP-BC [facility]  PCCM Pgr: [phone]

## 2018-05-21 NOTE — Progress Notes (Signed)
Wasted 25 ml of versed in the sink. Witnessed by Jonnie Kind, RN.

## 2018-05-21 NOTE — Code Documentation (Addendum)
Dr. Lacinda Axon attempting intubation at this time.  Pt rapidly desaturates with no bagging.  Difficult to palpate pulse at this time, but femoral is present.

## 2018-05-21 NOTE — ED Notes (Signed)
Pt now in narrow complex rhythm resembling idioventricular.

## 2018-05-21 NOTE — ED Notes (Signed)
Contacted Jolene Schimke with DSS as pt is [place] [facility] to verify pt's full code status.  She will return my call.

## 2018-05-21 NOTE — ED Triage Notes (Signed)
Pt brought in from [place] for evaluation of respiratory distress starting earlier today.  Pt is alert and oriented at this time.  Found to be in [place] on arrival to ED.

## 2018-05-21 NOTE — ED Provider Notes (Addendum)
[facility] EMERGENCY DEPARTMENT Provider Note   CSN: [id number] Arrival date & time: 05/21/18  1504    History   Chief Complaint Chief Complaint  Patient presents with  . Respiratory Distress    HPI [name] is a 63 y.o. female.     Level 5 caveat for acuity of condition.  Patient presents from home with profound respiratory distress.  She was admitted to the hospital in mid February for pneumonia.  Past medical history includes morbid obesity, diabetes, hypertension, mental health handicap, CHF, asthma.  Upon presentation to the emergency department patient appeared to be in ventricular tachycardia.  Patient was alert with a stable BP.     Past Medical History:  Diagnosis Date  . Arthritis   . Asthma   . CHF (congestive heart failure) ([place])   . Diabetes mellitus without complication ([place])   . Hypertension   . Iron deficiency anemia 10/16/2010  . Mental handicap 10/16/2010    Patient Active Problem List   Diagnosis Date Noted  . Respiratory arrest ([place]) 05/21/2018  . CAP (community acquired pneumonia) 05/13/2018  . Mixed hyperlipidemia 11/23/2017  . Hypercortisolemia ([place]) 04/27/2016  . Morbid obesity due to excess calories ([place]) 07/02/2015  . Uncontrolled type 2 diabetes mellitus with stage 4 chronic kidney disease ([place]) 02/26/2015  . Acute kidney injury superimposed on chronic kidney disease ([place]) 10/07/2011  . Cutaneous candidiasis 10/21/2010  . Essential hypertension, benign 10/21/2010  . Iron deficiency anemia 10/16/2010  . Mental handicap 10/16/2010    Past Surgical History:  Procedure Laterality Date  . ABDOMINAL HYSTERECTOMY    . CESAREAN SECTION    . CHOLECYSTECTOMY    . COLONOSCOPY  08/2010   normal TI, sigmoid polyp (adenoma ). Next TCS due  08/2015,  . ESOPHAGOGASTRODUODENOSCOPY  08/2010   antral and duodenal erosions s/p bx (chronic gastritis, no h.pylori, no celiac dz ), hiatal hernia  . KNEE SURGERY     right knee @ 63 years of age  .  LEFT AND RIGHT HEART CATHETERIZATION WITH CORONARY ANGIOGRAM N/A 09/21/2011   Procedure: LEFT AND RIGHT HEART CATHETERIZATION WITH CORONARY ANGIOGRAM;  Surgeon: Birdie Riddle, MD;  Location: [place] CATH LAB;  Service: Cardiovascular;  Laterality: N/A;     OB History   No obstetric history on file.      Home Medications    Prior to Admission medications   Medication Sig Start Date End Date Taking? Authorizing Provider  ACCU-CHEK AVIVA PLUS test strip USE TO TEST THREE TIMES DAILY. 02/23/18   Cassandria Anger, MD  ACCU-CHEK SOFTCLIX LANCETS lancets USE TO TEST THREE TIMES DAILY. 02/23/18   Cassandria Anger, MD  aspirin EC 81 MG tablet Take 81 mg by mouth daily.    [provider]  Choline Fenofibrate (FENOFIBRIC ACID) 135 MG CPDR Take 1 capsule by mouth daily.    [provider]  fluticasone (VERAMYST) 27.5 MCG/SPRAY nasal spray Place 2 sprays into the nose daily.    [provider]  Fluticasone-Umeclidin-Vilant (TRELEGY ELLIPTA) 100-62.5-25 MCG/INH AEPB Inhale into the lungs daily.    [provider]  furosemide (LASIX) 40 MG tablet Take 40 mg by mouth daily.    [provider]  guaiFENesin (ROBITUSSIN) 100 MG/5ML SOLN Take 5 mLs (100 mg total) by mouth every 4 (four) hours as needed for cough or to loosen phlegm. 05/16/18   Manuella Ghazi, Pratik D, DO  hydrALAZINE (APRESOLINE) 25 MG tablet Take 25 mg by mouth 3 (three) times daily.  [provider]  insulin regular human CONCENTRATED (HUMULIN R U-500 KWIKPEN) 500 UNIT/ML kwikpen Inject 20 Units into the skin 3 (three) times daily with meals. Inject 40 units with breakfast, 40 units with lunch, and 40 units with supper when blood glucose before food is greater than 90 mg/dL 05/16/18   Manuella Ghazi, Pratik D, DO  isosorbide dinitrate (ISORDIL) 20 MG tablet Take 20 mg by mouth 3 (three) times daily.    [provider]  levofloxacin (LEVAQUIN) 500 MG tablet Take 1 tablet (500 mg total) by mouth  daily for 5 days. 05/16/18 05/21/18  Manuella Ghazi, Pratik D, DO  loratadine (CLARITIN) 10 MG tablet Take 10 mg by mouth daily.    [provider]  losartan (COZAAR) 100 MG tablet Take 1 tablet by mouth daily. 05/09/18   [provider]  NOVOFINE AUTOCOVER 30G X 8 MM MISC USE AS DIRECTED FOR 3 TIMES DAILY INSULIN ADMINISTRATION. 04/04/18   Nida, Marella Chimes, MD  potassium chloride SA (K-DUR,KLOR-CON) 20 MEQ tablet daily. 11/10/17   [provider]  SURE COMFORT PEN NEEDLES 31G X 8 MM MISC USE AS DIRECTED WITH LEVEMIR AND NOVOLOG. UP TO FOUR TIMES DAILY. 02/12/17   Cassandria Anger, MD    Family History Family History  Problem Relation Age of Onset  . Diabetes Mother   . Hypertension Mother   . Hypertension Father   . Kidney disease Other        son reportedly has had cyst on kidney and lung s/p surgery at [facility]  . Hypertension Son   . Colon cancer Neg Hx     Social History Social History   Tobacco Use  . Smoking status: Former Smoker    Packs/day: 1.00    Years: 0.00    Pack years: 0.00    Types: Cigarettes    Last attempt to quit: 04/11/1996    Years since quitting: 22.1  . Smokeless tobacco: Never Used  . Tobacco comment: quit a couple year ago  Substance Use Topics  . Alcohol use: No  . Drug use: No     Allergies   Patient has no known allergies.   Review of Systems Review of Systems  Unable to perform ROS: Acuity of condition     Physical Exam Updated Vital Signs BP 110/65   Pulse 88   Temp (!) 97.5 F (36.4 C)   Resp 20   Ht _0  (1.626 m)   Wt 115.6 kg   SpO2 95% Comment: Simultaneous filing. User may not have seen previous data.  BMI 43.75 kg/m   Physical Exam   ED Treatments / Results  Labs (all labs ordered are listed, but only abnormal results are displayed) Labs Reviewed  CBC WITH DIFFERENTIAL/PLATELET - Abnormal; Notable for the following components:      Result Value   WBC 11.8 (*)    RBC 3.38 (*)     Hemoglobin 9.4 (*)    HCT 32.4 (*)    MCHC 29.0 (*)    Platelets 572 (*)    Neutro Abs 9.3 (*)    Abs Immature Granulocytes 0.44 (*)    All other components within normal limits  COMPREHENSIVE METABOLIC PANEL - Abnormal; Notable for the following components:   Sodium 126 (*)    Potassium >7.5 (*)    CO2 15 (*)    Glucose, Bld 322 (*)    BUN 91 (*)    Creatinine, Ser 4.31 (*)    Calcium 8.4 (*)  Albumin 2.9 (*)    GFR calc non Af Amer 10 (*)    GFR calc Af Amer 12 (*)    All other components within normal limits  BRAIN NATRIURETIC PEPTIDE - Abnormal; Notable for the following components:   B Natriuretic Peptide 125.0 (*)    All other components within normal limits  LACTIC ACID, PLASMA - Abnormal; Notable for the following components:   Lactic Acid, Venous 3.2 (*)    All other components within normal limits  BLOOD GAS, ARTERIAL - Abnormal; Notable for the following components:   pH, Arterial 7.152 (*)    pCO2 arterial 48.6 (*)    Bicarbonate 15.2 (*)    Acid-base deficit 10.9 (*)    All other components within normal limits  CBG MONITORING, ED - Abnormal; Notable for the following components:   Glucose-Capillary 341 (*)    All other components within normal limits  CULTURE, BLOOD (ROUTINE X 2)  CULTURE, BLOOD (ROUTINE X 2)  TROPONIN I  URINALYSIS, ROUTINE W REFLEX MICROSCOPIC  LACTIC ACID, PLASMA  BASIC METABOLIC PANEL    EKG None  Radiology Dg Chest Portable 1 View  Result Date: 05/21/2018 CLINICAL DATA:  This post intubation EXAM: PORTABLE CHEST 1 VIEW COMPARISON:  05/13/2018 FINDINGS: Endotracheal to is in the right mainstem bronchus. Recommend retracting approximately 2-3 cm. Cardiomegaly. Low lung volumes. Increasing left perihilar and lower lobe airspace opacity which may reflect atelectasis from the right mainstem intubation. Vascular congestion. No visible effusions or acute bony abnormality. IMPRESSION: Endotracheal tube just into the right mainstem  bronchus. Recommend retracting 2-3 cm. Increasing left perihilar and lower lobe opacity could reflect associated atelectasis related to right mainstem intubation. Vascular congestion, low lung volumes. These results were called by telephone at the time of interpretation on 05/21/2018 at 4:08 pm to Dr. Nat Christen , who verbally acknowledged these results. Electronically Signed   By: Rolm Baptise M.D.   On: 05/21/2018 16:08    Procedures Date/Time: 05/21/2018 3:30 PM Performed by: Nat Christen, MD Pre-anesthesia Checklist: Suction available and Patient being monitored Oxygen Delivery Method: Non-rebreather mask Preoxygenation: Pre-oxygenation with 100% oxygen Induction Type: Rapid sequence Ventilation: Mask ventilation with difficulty Laryngoscope Size: Glidescope and Mac Grade View: Grade IV Tube type: Subglottic suction tube Tube size: 7.5 mm Number of attempts: 2 Airway Equipment and Method: Patient positioned with wedge pillow,  Stylet and Video-laryngoscopy Placement Confirmation: ETT inserted through vocal cords under direct vision Tube secured with: ETT holder Difficulty Due To: Difficulty was anticipated, Difficult Airway- due to reduced neck mobility, Difficult Airway- due to anterior larynx and Difficult Airway- due to limited oral opening      (including critical care time)  Medications Ordered in ED Medications  calcium gluconate 10 % injection (  Not Given 05/21/18 1620)  propofol (DIPRIVAN) 1000 MG/100ML infusion (has no administration in time range)  propofol (DIPRIVAN) 1000 MG/100ML infusion (5 mcg/kg/min  115.6 kg Intravenous New Bag/Given 05/21/18 1610)  vancomycin (VANCOCIN) IVPB 1000 mg/200 mL premix (has no administration in time range)    Followed by  vancomycin (VANCOCIN) IVPB 1000 mg/200 mL premix (has no administration in time range)  calcium gluconate 1 g in sodium chloride 0.9 % 100 mL IVPB (has no administration in time range)  sodium chloride 0.9 % bolus  1,000 mL (1,000 mLs Intravenous New Bag/Given 05/21/18 1650)  insulin aspart (novoLOG) 100 UNIT/ML injection (has no administration in time range)  dextrose 50 % solution (has no administration in time range)  propofol (  DIPRIVAN) 1000 MG/100ML infusion (has no administration in time range)  sodium zirconium cyclosilicate (LOKELMA) packet 10 g (has no administration in time range)  amiodarone (CORDARONE) 300 mg in dextrose 5 % 100 mL bolus (0 mg Intravenous Stopped 05/21/18 1535)  succinylcholine (ANECTINE) injection (50 mg Intravenous Given 05/21/18 1537)  calcium gluconate 1 g in sodium chloride 0.9 % 100 mL IVPB (1 g Intravenous New Bag/Given 05/21/18 1530)  ceFEPIme (MAXIPIME) 2 g in sodium chloride 0.9 % 100 mL IVPB (2 g Intravenous New Bag/Given 05/21/18 1612)  sodium bicarbonate injection 50 mEq (50 mEq Intravenous Given 05/21/18 1651)  ipratropium-albuterol (DUONEB) 0.5-2.5 (3) MG/3ML nebulizer solution 3 mL (3 mLs Nebulization Given 05/21/18 1654)  dextrose 50 % solution 50 mL (50 mLs Intravenous Given 05/21/18 1650)  insulin aspart (novoLOG) injection 10 Units (10 Units Intravenous Given 05/21/18 1651)  calcium gluconate in NaCl 1-0.675 GM/50ML-% IVPB (1,000 mg  New Bag/Given 05/21/18 1652)     Initial Impression / Assessment and Plan / ED Course  I have reviewed the triage vital signs and the nursing notes.  Pertinent labs & imaging results that were available during my care of the patient were reviewed by me and considered in my medical decision making (see chart for details).       Patient presents with respiratory arrest and ventricular tachycardia 10/28/1968.  She was placed on BiPAP by EMS.  Patient was alert with pulses initially.  Amiodarone 300 mg bolus administered.  I was preparing to shock the patient, when she converted to a junctional rhythm rate of 70.  Intravenous calcium administered.  Pati chest x-ray and labs pending at this time.  Pt intubated with a 7.5 endotracheal tube p  premedication with propofol, etomidate, succinylcholine.  Patient vomited during the procedure.  Aggressive suctioning was performed.  An NG tube was placed.  Patient's rhythm remained junctional at approximately 70 bpm.  Venous vancomycin and Maxipime started long with a fluid bolus.  Her blood pressure was stable.  Discussed care with critical care physician Dr. Lake Bells.  Transfer to [employer].  1635: Potassium greater than 7.5.  Will initiate intravenous bicarb, intravenous calcium, albuterol nebulizer treatment, D50 and insulin bolus.  Fluid bolus continues.  1735: ABG obtained.  pH 7.15.  PCO2 48.  Lactate elevated.  We will continue to hydrate and hyperventilate to blow off CO2.  CRITICAL CARE Performed by: Nat Christen Total critical care time: 95 minutes Critical care time was exclusive of separately billable procedures and treating other patients. Critical care was necessary to treat or prevent imminent or life-threatening deterioration. Critical care was time spent personally by me on the following activities: development of treatment plan with patient and/or surrogate as well as nursing, discussions with consultants, evaluation of patient's response to treatment, examination of patient, obtaining history from patient or surrogate, ordering and performing treatments and interventions, ordering and review of laboratory studies, ordering and review of radiographic studies, pulse oximetry and re-evaluation of patient's condition.  Final Clinical Impressions(s) / ED Diagnoses   Final diagnoses:  Respiratory arrest [employer] [facility] [employer] [facility])  Ventricular tachycardia ([place])  Hyperkalemia  AKI (acute kidney injury) [facility])    ED Discharge Orders    None       Nat Christen, MD 05/21/18 1606    Nat Christen, MD 05/21/18 1609    Nat Christen, MD 05/21/18 1649    Nat Christen, MD 05/21/18 [phone]

## 2018-05-21 NOTE — ED Notes (Signed)
Date and time results received: 05/21/18 1736 (use smartphrase ".now" to insert current time)  Test:Ph Critical Value: 7.152  Name of Provider Notified: Dr Lacinda Axon Orders Received? Or Actions Taken?:NA

## 2018-05-21 NOTE — Consult Note (Signed)
Renal Service Consult Note [facility]  AVONELL LENIG 05/21/2018 Sol Blazing Requesting Physician:  Dr Randell Patient, Lenna Sciara.   Reason for Consult:  AKI on CKD HPI: The patient is a 63 y.o. year-old w/ hx of mental deficiency, fe def, anemia, systolic CHF, HFpEF, COPD, DM, HTN, AKI, CKD 2/3, and recently admitted 2/14- 05/16/18 for CAP w/ fevers, chills, sputum, n/v.  She now presents w/ resp distress sent from LTC to [facility] then transferred to [facility]. Had Vtach on arrival to ED.  Converted to [facility] in ED spont.  Labs returned K+ > 7.5 at outside facility , reported Creat was around 4 (from [facility]). Intubated and treated acutely for hyperkalemia, repeat K+ still > 7.5.  Asked to see for AKI/ hyperkalemia.    Patient intubated, not able to give history.   ROS  n/a   Past Medical History  Past Medical History:  Diagnosis Date  . Arthritis   . Asthma   . CHF (congestive heart failure) ([place])   . Diabetes mellitus without complication ([place])   . Hypertension   . Iron deficiency anemia 10/16/2010  . Mental handicap 10/16/2010   Past Surgical History  Past Surgical History:  Procedure Laterality Date  . ABDOMINAL HYSTERECTOMY    . CESAREAN SECTION    . CHOLECYSTECTOMY    . COLONOSCOPY  08/2010   normal TI, sigmoid polyp (adenoma ). Next TCS due  08/2015,  . ESOPHAGOGASTRODUODENOSCOPY  08/2010   antral and duodenal erosions s/p bx (chronic gastritis, no h.pylori, no celiac dz ), hiatal hernia  . KNEE SURGERY     right knee @ 63 years of age  . LEFT AND RIGHT HEART CATHETERIZATION WITH CORONARY ANGIOGRAM N/A 09/21/2011   Procedure: LEFT AND RIGHT HEART CATHETERIZATION WITH CORONARY ANGIOGRAM;  Surgeon: Birdie Riddle, MD;  Location: [facility]ATH [facility];  Service: Cardiovascular;  Laterality: N/A;   Family History  Family History  Problem Relation Age of Onset  . Diabetes Mother   . Hypertension Mother   . Hypertension Father   . Kidney disease Other        son reportedly has had cyst on  kidney and lung s/p surgery at [facility]  . Hypertension Son   . Colon cancer Neg Hx    Social History  reports that she quit smoking about 22 years ago. Her smoking use included cigarettes. She smoked 1.00 pack per day for 0.00 years. She has never used smokeless tobacco. She reports that she does not drink alcohol or use drugs. Allergies No Known Allergies Home medications Prior to Admission medications   Medication Sig Start Date End Date Taking? Authorizing Provider  ACCU-CHEK AVIVA PLUS test strip USE TO TEST THREE TIMES DAILY. 02/23/18   Cassandria Anger, MD  ACCU-CHEK SOFTCLIX LANCETS lancets USE TO TEST THREE TIMES DAILY. 02/23/18   Cassandria Anger, MD  aspirin EC 81 MG tablet Take 81 mg by mouth daily.    [provider]  Choline Fenofibrate (FENOFIBRIC ACID) 135 MG CPDR Take 1 capsule by mouth daily.    [provider]  fluticasone (VERAMYST) 27.5 MCG/SPRAY nasal spray Place 2 sprays into the nose daily.    [provider]  Fluticasone-Umeclidin-Vilant (TRELEGY ELLIPTA) 100-62.5-25 MCG/INH AEPB Inhale into the lungs daily.    [provider]  furosemide (LASIX) 40 MG tablet Take 40 mg by mouth daily.    [provider]  guaiFENesin (ROBITUSSIN) 100 MG/5ML SOLN Take 5 mLs (100 mg total) by  mouth every 4 (four) hours as needed for cough or to loosen phlegm. 05/16/18   Manuella Ghazi, Pratik D, DO  hydrALAZINE (APRESOLINE) 25 MG tablet Take 25 mg by mouth 3 (three) times daily.    [provider]  insulin regular human CONCENTRATED (HUMULIN R U-500 KWIKPEN) 500 UNIT/ML kwikpen Inject 20 Units into the skin 3 (three) times daily with meals. Inject 40 units with breakfast, 40 units with lunch, and 40 units with supper when blood glucose before food is greater than 90 mg/dL 05/16/18   Manuella Ghazi, Pratik D, DO  isosorbide dinitrate (ISORDIL) 20 MG tablet Take 20 mg by mouth 3 (three) times daily.    [provider]  levofloxacin (LEVAQUIN)  500 MG tablet Take 1 tablet (500 mg total) by mouth daily for 5 days. 05/16/18 05/21/18  Manuella Ghazi, Pratik D, DO  loratadine (CLARITIN) 10 MG tablet Take 10 mg by mouth daily.    [provider]  losartan (COZAAR) 100 MG tablet Take 1 tablet by mouth daily. 05/09/18   [provider]  NOVOFINE AUTOCOVER 30G X 8 MM MISC USE AS DIRECTED FOR 3 TIMES DAILY INSULIN ADMINISTRATION. 04/04/18   Nida, Marella Chimes, MD  potassium chloride SA (K-DUR,KLOR-CON) 20 MEQ tablet daily. 11/10/17   [provider]  SURE COMFORT PEN NEEDLES 31G X 8 MM MISC USE AS DIRECTED WITH LEVEMIR AND NOVOLOG. UP TO FOUR TIMES DAILY. 02/12/17   Cassandria Anger, MD   Liver Function Tests Recent Labs  Lab 05/21/18 1515  AST 30  ALT 20  ALKPHOS 54  BILITOT 0.3  PROT 7.4  ALBUMIN 2.9*   No results for input(s): LIPASE, AMYLASE in the last 168 hours. CBC Recent Labs  Lab 05/15/18 0706 05/16/18 0407 05/21/18 1515 05/21/18 1958 05/21/18 2019  WBC 10.9* 8.7 11.8*  --   --   NEUTROABS  --   --  9.3*  --   --   HGB 9.3* 9.3* 9.4* 8.8* 8.2*  HCT 31.4* 31.2* 32.4* 26.0* 24.0*  MCV 98.1 96.9 95.9  --   --   PLT 404* 455* 572*  --   --    Basic Metabolic Panel Recent Labs  Lab 05/15/18 0706 05/16/18 0407 05/21/18 1515 05/21/18 1958 05/21/18 2019  NA 136 135 126* 127* 129*  K 4.6 5.1 >7.5* 7.5* 7.0*  CL 105 101 100  --   --   CO2 22 24 15*  --   --   GLUCOSE 202* 77 322*  --   --   BUN 20 24* 91*  --   --   CREATININE 1.36* 1.63* 4.31*  --   --   CALCIUM 8.7* 8.9 8.4*  --   --    Iron/TIBC/Ferritin/ %Sat    Component Value Date/Time   IRON 32 (L) 02/16/2011 1026   TIBC 299 02/16/2011 1026   FERRITIN 82 02/16/2011 1026   IRONPCTSAT 11 (L) 02/16/2011 1026    Vitals:   05/21/18 1745 05/21/18 1800 05/21/18 1806 05/21/18 1815  BP: (!) 97/49 133/70  110/76  Pulse: 86 94  87  Resp: 18 (!) 23  (!) 21  Temp: (!) 97.2 F (36.2 C) (!) 97.2 F (36.2 C)  (!) 97.3 F (36.3 C)  SpO2:  96% 97%  98%  Weight:      Height:   _0  (1.626 m)    Exam Gen pt intubated and sedated, not responsive, on vent No rash, cyanosis or gangrene Sclera anicteric, throat w ETT  ++  JVD Chest clear bilat ant and lat RRR no MRG noted Abd soft ntnd no mass or ascites +bs obese GU foley in place w/ small amts clear yellow urine MS no joint effusions or deformity Ext 2-3+ bilat LE edema from feet to knees,  no wounds or ulcers Neuro is sedated on vent    Home meds:  - furosemide 40 qd/ hydralazine 50 tid/ losartan 100 qd/ Kdur qd  - aspirin 81/ isosorbide dinitrate 20 tid/ fenofibrate 135 qd  - insulin regular 40 u tid ac if BS> 90  - fluticasone-umeclidin-vilant qd   Date  Creat   eGFR  2012-13 0.5- 0.7  2013- 2018 1.2- 1.9      2019  1.4- 2.0  26- 39       05/13/18 1.83   29     05/16/18 1.63   33  05/21/18 4.65   9      05/14/18  UA negative   05/21/18  UA cloudy neg >50 rbc, 0-5 epi, 11-20 wbc  Renal US 11/17/17 > 10.8/ 12.5 cm kidneys, normal echo and no hydro bilat  CXR 2/22 > vasc congestion, LLL infiltrate  Assessment: 1. AKI - not sure etiology, sepsis/ decomp CHF + ARB. Not in shock.  2. Hyperkalemia - severe, wide complex earlier now QRS is narrow 3. Vol overload - LE edema, hx syst CHF but more recently HFpEF.  4. CKD 3/4 - baseline creat 1.4- 2.0, possibly cardiorenal. Neg UA at baseline 5. Combined resp and metabolic acidosis w/ CO2 retention, ?OHS  6. HTN - on ARB/ hydral/ lasix at home. Holding now.  7. Mental deficiency 8. DM2 on insulin long-term    P: 1. Recommend CRRT tonight for K+ correction, low K+ in RF's and dialysate. Correct acidosis. Will follow.       [facility] 05/21/2018, 8:37 PM

## 2018-05-21 NOTE — ED Notes (Signed)
ET tube adjusted and is now 22cm at lip

## 2018-05-21 NOTE — Progress Notes (Signed)
ANTIBIOTIC CONSULT NOTE-Preliminary  Pharmacy Consult for Vancomycin Indication: pneumonia, ? sepsis  No Known Allergies  Patient Measurements: Height: 5\' 4"  (162.6 cm) Weight: 254 lb 13.6 oz (115.6 kg) IBW/kg (Calculated) : 54.7  Vital Signs: Temp: 93.9 F (34.4 C) (02/22 1602) BP: 126/65 (02/22 1600) Pulse Rate: 88 (02/22 1602)  Labs: Recent Labs    05/21/18 1515  WBC 11.8*  HGB 9.4*  PLT 572*    Estimated Creatinine Clearance: 44.7 mL/min (A) (by C-G formula based on SCr of 1.63 mg/dL (H)).  No results for input(s): VANCOTROUGH, VANCOPEAK, VANCORANDOM, GENTTROUGH, GENTPEAK, GENTRANDOM, TOBRATROUGH, TOBRAPEAK, TOBRARND, AMIKACINPEAK, AMIKACINTROU, AMIKACIN in the last 72 hours.   Microbiology: Recent Results (from the past 720 hour(s))  Culture, blood (Routine X 2) w Reflex to ID Panel     Status: None   Collection Time: 05/13/18  6:17 PM  Result Value Ref Range Status   Specimen Description RIGHT ANTECUBITAL  Final   Special Requests   Final    BOTTLES DRAWN AEROBIC ONLY Blood Culture adequate volume   Culture   Final    NO GROWTH 5 DAYS Performed at [facility], [address], [place], [place] [address]    Report Status 05/18/2018 FINAL  Final  Culture, blood (Routine X 2) w Reflex to ID Panel     Status: None   Collection Time: 05/13/18  6:24 PM  Result Value Ref Range Status   Specimen Description LEFT ANTECUBITAL  Final   Special Requests   Final    BOTTLES DRAWN AEROBIC AND ANAEROBIC Blood Culture adequate volume   Culture   Final    NO GROWTH 5 DAYS Performed at [facility], [address], [place], [place] [address]    Report Status 05/18/2018 FINAL  Final  MRSA PCR Screening     Status: None   Collection Time: 05/14/18  6:15 PM  Result Value Ref Range Status   MRSA by PCR NEGATIVE NEGATIVE Final    Comment:        The GeneXpert MRSA Assay (FDA approved for NASAL specimens only), is one component of a comprehensive MRSA  colonization surveillance program. It is not intended to diagnose MRSA infection nor to guide or monitor treatment for MRSA infections. Performed at [facility], [address], [place], [place] [address]   Culture, respiratory     Status: None   Collection Time: 05/15/18  4:00 AM  Result Value Ref Range Status   Specimen Description   Final    SPUTUM Performed at [facility], [address], [place], [place] [address]    Special Requests   Final    NONE Performed at [facility], [address] [place], [place] [address]    Gram Stain   Final    MODERATE WBC PRESENT,BOTH PMN AND MONONUCLEAR FEW SQUAMOUS EPITHELIAL CELLS PRESENT MODERATE GRAM NEGATIVE RODS MODERATE GRAM POSITIVE COCCI IN PAIRS FEW GRAM POSITIVE RODS    Culture   Final    MODERATE Consistent with normal respiratory flora. Performed at [facility], [place] [address] [place], [place] [address]    Report Status 05/17/2018 FINAL  Final    Medical History: Past Medical History:  Diagnosis Date  . Arthritis   . Asthma   . CHF (congestive heart failure) ([place])   . Diabetes mellitus without complication ([place])   . Hypertension   . Iron deficiency anemia 10/16/2010  . Mental handicap 10/16/2010    Medications:  (Not in a hospital admission)  Assessment: 63yo female admitted with suspected pneumonia.  Asked to initiate Vancomycin per protocol.   Goal of Therapy:  Vancomycin trough level 15-20 mcg/ml  Plan:  Preliminary review of pertinent patient information completed.  Protocol will be initiated with dose(s) of Vancomycin 2000mg  as loading dose.  Forestine Na clinical pharmacist will complete review during morning rounds to assess patient and finalize treatment regimen if needed.  Hart Robinsons A, RPH 05/21/2018,4:29 PM

## 2018-05-21 NOTE — ED Notes (Signed)
Date and time results received: 05/21/18 1648 (use smartphrase ".now" to insert current time)  Test: k+ Critical Value: >7.5 Name of Provider Notified: dr Lacinda Axon  Orders Received? Or Actions Taken?: see chart

## 2018-05-21 NOTE — ED Notes (Signed)
Pt in [place] at this time with pulse, BP stable.  Preparing to cardiovert when pt had a rhythm change to a wide complex tachycardia with distinguishable QRS.  Consulting cardiology for further recommendations.

## 2018-05-21 NOTE — H&P (Addendum)
NAME:  [name], MRN:  [id number], DOB:  July 22, 1955, LOS: 0 ADMISSION DATE:  05/21/2018, CONSULTATION DATE:  05/21/2018 REFERRING MD:  Dr. Lacinda Axon, CHIEF COMPLAINT:  Respiratory Distress   History of present illness   63 year old female presents from [place] to [facility] with reported one day of respiratory distress, placed on BiPAP by EMS. On arrival to ED found to be in [place]. Given Amiodarone bolus Cardiology consulted. Preparing for cardioversion, however patient converted into wide complex tachycardia. Intubated, during intubation had notable emesis. Given Vancomycin/Zosyn. K 7.5, Crt 4.31. Administered Bicarb, Calcium, Albuterol, Insulin/Dextrose. ABG 7.15/45. LA 3.2. Transferred to [facility] for further treatment.   Recent admission 2/14-2/17 with CAP. Noted to have AKI which improved. At discharge Cr 1.63. CT Chest concerning for neoplastic process of left lower lung base.   Past Medical History  Obesity, HTN, Mental Health Handicap, grade 1 diastolic heart failure, Asthma  Significant Hospital Events   2/22 > Presents to ED   Consults:  PCCM 2/22  Procedures:  ETT 2/22 >>   Significant Diagnostic Tests:  CT Chest 2/14 > Masslike consolidation measuring 5 x 8.2 cm over the left lower lobe  CXR 2/22 > Endotracheal tube just into the right mainstem bronchus. Recommend retracting 2-3 cm. Increasing left perihilar and lower lobe opacity could reflect associated atelectasis related to right mainstem intubation.  Micro Data:  Blood 2/22 >> Tracheal Asp 2/22 >> U/A 2/22 > Negative   Antimicrobials:  Cefepime 2/22 >> Vancomycin 2/22 >>    Interim history/subjective:    Objective   Blood pressure 110/76, pulse 87, temperature (!) 97.3 F (36.3 C), resp. rate (!) 21, height 5\' 4"  (1.626 m), weight 115.6 kg, SpO2 98 %.    Vent Mode: PRVC FiO2 (%):  [50 %] 50 % Set Rate:  [18 bmp] 18 bmp Vt Set:  [450 mL] 450 mL PEEP:  [5 cmH20] 5 cmH20 Plateau Pressure:  [25 cmH20] 25  cmH20   Intake/Output Summary (Last 24 hours) at 05/21/2018 1928 Last data filed at 05/21/2018 1829 Gross per 24 hour  Intake 398.49 ml  Output -  Net 398.49 ml   Filed Weights   05/21/18 1512  Weight: 115.6 kg    Examination: General: Adult female, on vent  HENT: Dry MM  Lungs: Rhonchi, no wheeze/crackles  Cardiovascular: RRR, no MRG  Abdomen: Obese, soft, non-tender  Extremities: +3 pedal edema  Neuro: Lethargic, follows commands, pupils intact  GU: foley in place  Resolved Hospital Problem list     Assessment & Plan:   Acute Hypoxic/Hypercarbic Respiratory Failure  Left Lower Lobe Consolidation on previous CT concerning for Neoplastic Process  H/O Asthma, COPD  Plan -Vent Support -Trend ABG/CXR -Pulmonary Hygiene  -Scheduled Nebs   Junctional Rhythm with prolonged QTC in setting of hyperkalemia  Decompensated Heart Failure   Grade 1 Diastolic Heart Failure H/O HTN Plan  -Cardiac Monitoring -Maintain MAP >65  -ECHO pending -Repeat EKG -Hold home Hydralazine, Cozaar, Lasix    Acute on Chronic Kidney Disease with Hyperkalemia  -dehydration and medication effect? Does not appear septic, takes K supplement at home  Non-Gap Metabolic Acidosis Lactic Acidosis   Plan  -Temporized with Bicarb/Insulin/Calcium at OSH -Nephrology consulted -Due to lack of Nephrology nurses will start CRRT -Trend BMP  -Renal US pending   Hyperglycemia  DM Plan  -Trend Glucose -SSI   Leukocytosis Recent CAP Treatment  Plan -PAN Culture -Received Cefepime/Vancomycin in ED  -Trend WBC and Fever Curve  -  PCT   Chronic Anemia  Plan -Trend CBC  -Heparin for DVT ppx   Sedation Needs  Plan  -RASS Goal 0/-1 -Titrate Fentanyl for RASS goal   Best practice:  Diet: NPO Pain/Anxiety/Delirium protocol  VAP protocol  DVT prophylaxis: Heparin SQ GI prophylaxis: Pepcid  Glucose control: SSI  Mobility: Bedrest  Code Status: Full Code  Family Communication: [place]    Disposition: Continue ICU care   Labs   CBC: Recent Labs  Lab 05/15/18 0706 05/16/18 0407 05/21/18 1515  WBC 10.9* 8.7 11.8*  NEUTROABS  --   --  9.3*  HGB 9.3* 9.3* 9.4*  HCT 31.4* 31.2* 32.4*  MCV 98.1 96.9 95.9  PLT 404* 455* 572*    Basic Metabolic Panel: Recent Labs  Lab 05/15/18 0706 05/16/18 0407 05/21/18 1515  NA 136 135 126*  K 4.6 5.1 >7.5*  CL 105 101 100  CO2 22 24 15*  GLUCOSE 202* 77 322*  BUN 20 24* 91*  CREATININE 1.36* 1.63* 4.31*  CALCIUM 8.7* 8.9 8.4*   GFR: Estimated Creatinine Clearance: 16.9 mL/min (A) (by C-G formula based on SCr of 4.31 mg/dL (H)). Recent Labs  Lab 05/15/18 0706 05/16/18 0407 05/21/18 1515 05/21/18 1544 05/21/18 1840  PROCALCITON 0.23  --   --   --   --   WBC 10.9* 8.7 11.8*  --   --   LATICACIDVEN  --   --   --  3.2* 1.7    Liver Function Tests: Recent Labs  Lab 05/21/18 1515  AST 30  ALT 20  ALKPHOS 54  BILITOT 0.3  PROT 7.4  ALBUMIN 2.9*   No results for input(s): LIPASE, AMYLASE in the last 168 hours. No results for input(s): AMMONIA in the last 168 hours.  ABG    Component Value Date/Time   PHART 7.152 (LL) 05/21/2018 1717   PCO2ART 48.6 (H) 05/21/2018 1717   PO2ART 90.5 05/21/2018 1717   HCO3 15.2 (L) 05/21/2018 1717   TCO2 28 09/21/2011 0811   ACIDBASEDEF 10.9 (H) 05/21/2018 1717   O2SAT 94.1 05/21/2018 1717     Coagulation Profile: No results for input(s): INR, PROTIME in the last 168 hours.  Cardiac Enzymes: Recent Labs  Lab 05/21/18 1515  TROPONINI <0.03    HbA1C: Hgb A1c MFr Bld  Date/Time Value Ref Range Status  02/16/2018 07:41 AM 7.4 (H) <5.7 % of total Hgb Final    Comment:    For someone without known diabetes, a hemoglobin A1c value of 6.5% or greater indicates that they may have  diabetes and this should be confirmed with a follow-up  test. . For someone with known diabetes, a value <7% indicates  that their diabetes is well controlled and a value  greater than or  equal to 7% indicates suboptimal  control. A1c targets should be individualized based on  duration of diabetes, age, comorbid conditions, and  other considerations. . Currently, no consensus exists regarding use of hemoglobin A1c for diagnosis of diabetes for children. Marland Kitchen   11/18/2017 07:57 AM 6.2 (H) <5.7 % of total Hgb Final    Comment:    For someone without known diabetes, a hemoglobin  A1c value between 5.7% and 6.4% is consistent with prediabetes and should be confirmed with a  follow-up test. . For someone with known diabetes, a value <7% indicates that their diabetes is well controlled. A1c targets should be individualized based on duration of diabetes, age, comorbid conditions, and other considerations. . This assay  result is consistent with an increased risk of diabetes. . Currently, no consensus exists regarding use of hemoglobin A1c for diagnosis of diabetes for children. .     CBG: Recent Labs  Lab 05/15/18 1852 05/15/18 2052 05/16/18 0737 05/16/18 1126 05/21/18 1523  GLUCAP 91 81 83 146* 341*    Review of Systems:   Unable to review as patient is intubated/sedated   Past Medical History  She,  has a past medical history of Arthritis, Asthma, CHF (congestive heart failure) ([place]), Diabetes mellitus without complication ([place]), Hypertension, Iron deficiency anemia (10/16/2010), and Mental handicap (10/16/2010).   Surgical History    Past Surgical History:  Procedure Laterality Date  . ABDOMINAL HYSTERECTOMY    . CESAREAN SECTION    . CHOLECYSTECTOMY    . COLONOSCOPY  08/2010   normal TI, sigmoid polyp (adenoma ). Next TCS due  08/2015,  . ESOPHAGOGASTRODUODENOSCOPY  08/2010   antral and duodenal erosions s/p bx (chronic gastritis, no h.pylori, no celiac dz ), hiatal hernia  . KNEE SURGERY     right knee @ 63 years of age  . LEFT AND RIGHT HEART CATHETERIZATION WITH CORONARY ANGIOGRAM N/A 09/21/2011   Procedure: LEFT AND RIGHT HEART CATHETERIZATION WITH  CORONARY ANGIOGRAM;  Surgeon: Birdie Riddle, MD;  Location: [facility];  Service: Cardiovascular;  Laterality: N/A;     Social History   reports that she quit smoking about 22 years ago. Her smoking use included cigarettes. She smoked 1.00 pack per day for 0.00 years. She has never used smokeless tobacco. She reports that she does not drink alcohol or use drugs.   Family History   Her family history includes Diabetes in her mother; Hypertension in her father, mother, and son; Kidney disease in an other family member. There is no history of Colon cancer.   Allergies No Known Allergies   Home Medications  Prior to Admission medications   Medication Sig Start Date End Date Taking? Authorizing Provider  ACCU-CHEK AVIVA PLUS test strip USE TO TEST THREE TIMES DAILY. 02/23/18   Cassandria Anger, MD  ACCU-CHEK SOFTCLIX LANCETS lancets USE TO TEST THREE TIMES DAILY. 02/23/18   Cassandria Anger, MD  aspirin EC 81 MG tablet Take 81 mg by mouth daily.    [provider]  Choline Fenofibrate (FENOFIBRIC ACID) 135 MG CPDR Take 1 capsule by mouth daily.    [provider]  fluticasone (VERAMYST) 27.5 MCG/SPRAY nasal spray Place 2 sprays into the nose daily.    [provider]  Fluticasone-Umeclidin-Vilant (TRELEGY ELLIPTA) 100-62.5-25 MCG/INH AEPB Inhale into the lungs daily.    [provider]  furosemide (LASIX) 40 MG tablet Take 40 mg by mouth daily.    [provider]  guaiFENesin (ROBITUSSIN) 100 MG/5ML SOLN Take 5 mLs (100 mg total) by mouth every 4 (four) hours as needed for cough or to loosen phlegm. 05/16/18   Manuella Ghazi, Pratik D, DO  hydrALAZINE (APRESOLINE) 25 MG tablet Take 25 mg by mouth 3 (three) times daily.    [provider]  insulin regular human CONCENTRATED (HUMULIN R U-500 KWIKPEN) 500 UNIT/ML kwikpen Inject 20 Units into the skin 3 (three) times daily with meals. Inject 40 units with breakfast, 40 units with lunch, and 40  units with supper when blood glucose before food is greater than 90 mg/dL 05/16/18   Manuella Ghazi, Pratik D, DO  isosorbide dinitrate (ISORDIL) 20 MG tablet Take 20 mg by mouth 3 (three) times daily.    [provider]  levofloxacin (LEVAQUIN) 500 MG tablet Take 1 tablet (500 mg total) by mouth daily for 5 days. 05/16/18 05/21/18  Manuella Ghazi, Pratik D, DO  loratadine (CLARITIN) 10 MG tablet Take 10 mg by mouth daily.    [provider]  losartan (COZAAR) 100 MG tablet Take 1 tablet by mouth daily. 05/09/18   [provider]  NOVOFINE AUTOCOVER 30G X 8 MM MISC USE AS DIRECTED FOR 3 TIMES DAILY INSULIN ADMINISTRATION. 04/04/18   Nida, Marella Chimes, MD  potassium chloride SA (K-DUR,KLOR-CON) 20 MEQ tablet daily. 11/10/17   [provider]  SURE COMFORT PEN NEEDLES 31G X 8 MM MISC USE AS DIRECTED WITH LEVEMIR AND NOVOLOG. UP TO FOUR TIMES DAILY. 02/12/17   Cassandria Anger, MD     Critical care time: 62 minutes

## 2018-05-21 NOTE — Code Documentation (Signed)
Pt had rhythm change with significant pauses, however returned to baseline with oxygenation.

## 2018-05-21 NOTE — ED Notes (Signed)
Pt converted to slower rhythm at this time.

## 2018-05-21 NOTE — ED Notes (Signed)
Report given to Lonnie with [facility] 

## 2018-05-21 NOTE — ED Notes (Signed)
Date and time results received: 05/21/18 1621 (use smartphrase ".now" to insert current time)  Test: Lactic Acid Critical Value: 3.2  Name of Provider Notified: Dr Lacinda Axon Orders Received? Or Actions Taken?: NA

## 2018-05-22 ENCOUNTER — Inpatient Hospital Stay (HOSPITAL_COMMUNITY): Payer: Medicare Other

## 2018-05-22 DIAGNOSIS — N179 Acute kidney failure, unspecified: Principal | ICD-10-CM

## 2018-05-22 DIAGNOSIS — R092 Respiratory arrest: Secondary | ICD-10-CM

## 2018-05-22 DIAGNOSIS — R072 Precordial pain: Secondary | ICD-10-CM

## 2018-05-22 LAB — POCT I-STAT 7, (LYTES, BLD GAS, ICA,H+H)
Acid-base deficit: 1 mmol/L (ref 0.0–2.0)
Bicarbonate: 25.6 mmol/L (ref 20.0–28.0)
Calcium, Ion: 1.16 mmol/L (ref 1.15–1.40)
HCT: 24 % — ABNORMAL LOW (ref 36.0–46.0)
Hemoglobin: 8.2 g/dL — ABNORMAL LOW (ref 12.0–15.0)
O2 Saturation: 97 %
Patient temperature: 36.5
Potassium: 4.8 mmol/L (ref 3.5–5.1)
Sodium: 135 mmol/L (ref 135–145)
TCO2: 27 mmol/L (ref 22–32)
pCO2 arterial: 47.6 mmHg (ref 32.0–48.0)
pH, Arterial: 7.337 — ABNORMAL LOW (ref 7.350–7.450)
pO2, Arterial: 98 mmHg (ref 83.0–108.0)

## 2018-05-22 LAB — RENAL FUNCTION PANEL
Albumin: 2.3 g/dL — ABNORMAL LOW (ref 3.5–5.0)
Anion gap: 11 (ref 5–15)
BUN: 64 mg/dL — ABNORMAL HIGH (ref 8–23)
CO2: 22 mmol/L (ref 22–32)
Calcium: 7.7 mg/dL — ABNORMAL LOW (ref 8.9–10.3)
Chloride: 100 mmol/L (ref 98–111)
Creatinine, Ser: 3.42 mg/dL — ABNORMAL HIGH (ref 0.44–1.00)
GFR calc Af Amer: 16 mL/min — ABNORMAL LOW (ref >60)
GFR calc non Af Amer: 14 mL/min — ABNORMAL LOW (ref >60)
Glucose, Bld: 169 mg/dL — ABNORMAL HIGH (ref 70–99)
Phosphorus: 4.7 mg/dL — ABNORMAL HIGH (ref 2.5–4.6)
Potassium: 5.6 mmol/L — ABNORMAL HIGH (ref 3.5–5.1)
Sodium: 133 mmol/L — ABNORMAL LOW (ref 135–145)

## 2018-05-22 LAB — URINALYSIS, ROUTINE W REFLEX MICROSCOPIC
Bilirubin Urine: NEGATIVE
Glucose, UA: 50 mg/dL — AB
Ketones, ur: 5 mg/dL — AB
Nitrite: NEGATIVE
Protein, ur: 100 mg/dL — AB
Specific Gravity, Urine: 1.025 (ref 1.005–1.030)
pH: 5 (ref 5.0–8.0)

## 2018-05-22 LAB — BASIC METABOLIC PANEL
Anion gap: 9 (ref 5–15)
Anion gap: 9 (ref 5–15)
BUN: 80 mg/dL — ABNORMAL HIGH (ref 8–23)
BUN: 49 mg/dL — ABNORMAL HIGH (ref 8–23)
CO2: 21 mmol/L — ABNORMAL LOW (ref 22–32)
CO2: 23 mmol/L (ref 22–32)
Calcium: 7.9 mg/dL — ABNORMAL LOW (ref 8.9–10.3)
Calcium: 8 mg/dL — ABNORMAL LOW (ref 8.9–10.3)
Chloride: 101 mmol/L (ref 98–111)
Chloride: 101 mmol/L (ref 98–111)
Creatinine, Ser: 4.08 mg/dL — ABNORMAL HIGH (ref 0.44–1.00)
Creatinine, Ser: 2.91 mg/dL — ABNORMAL HIGH (ref 0.44–1.00)
GFR calc Af Amer: 13 mL/min — ABNORMAL LOW (ref >60)
GFR calc Af Amer: 19 mL/min — ABNORMAL LOW (ref >60)
GFR calc non Af Amer: 11 mL/min — ABNORMAL LOW (ref >60)
GFR calc non Af Amer: 17 mL/min — ABNORMAL LOW (ref >60)
Glucose, Bld: 215 mg/dL — ABNORMAL HIGH (ref 70–99)
Glucose, Bld: 119 mg/dL — ABNORMAL HIGH (ref 70–99)
Potassium: 6.4 mmol/L (ref 3.5–5.1)
Potassium: 4.8 mmol/L (ref 3.5–5.1)
Sodium: 131 mmol/L — ABNORMAL LOW (ref 135–145)
Sodium: 133 mmol/L — ABNORMAL LOW (ref 135–145)

## 2018-05-22 LAB — CBC
HCT: 24.2 % — ABNORMAL LOW (ref 36.0–46.0)
Hemoglobin: 7.4 g/dL — ABNORMAL LOW (ref 12.0–15.0)
MCH: 28.5 pg (ref 26.0–34.0)
MCHC: 30.6 g/dL (ref 30.0–36.0)
MCV: 93.1 fL (ref 80.0–100.0)
Platelets: 321 10*3/uL (ref 150–400)
RBC: 2.6 MIL/uL — ABNORMAL LOW (ref 3.87–5.11)
RDW: 14.2 % (ref 11.5–15.5)
WBC: 8.4 10*3/uL (ref 4.0–10.5)
nRBC: 0 % (ref 0.0–0.2)

## 2018-05-22 LAB — BLOOD GAS, ARTERIAL
Acid-base deficit: 4.2 mmol/L — ABNORMAL HIGH (ref 0.0–2.0)
Bicarbonate: 20.9 mmol/L (ref 20.0–28.0)
FIO2: 0.4
MECHVT: 430 mL
O2 Saturation: 97.8 %
PEEP: 5 cmH2O
Patient temperature: 98.6
RATE: 22 resp/min
pCO2 arterial: 41.7 mmHg (ref 32.0–48.0)
pH, Arterial: 7.32 — ABNORMAL LOW (ref 7.350–7.450)
pO2, Arterial: 109 mmHg — ABNORMAL HIGH (ref 83.0–108.0)

## 2018-05-22 LAB — GLUCOSE, CAPILLARY
Glucose-Capillary: 160 mg/dL — ABNORMAL HIGH (ref 70–99)
Glucose-Capillary: 130 mg/dL — ABNORMAL HIGH (ref 70–99)
Glucose-Capillary: 118 mg/dL — ABNORMAL HIGH (ref 70–99)
Glucose-Capillary: 126 mg/dL — ABNORMAL HIGH (ref 70–99)
Glucose-Capillary: 130 mg/dL — ABNORMAL HIGH (ref 70–99)

## 2018-05-22 LAB — ECHOCARDIOGRAM COMPLETE
Height: 64 in
Weight: 4546.77 oz

## 2018-05-22 LAB — SODIUM, URINE, RANDOM: Sodium, Ur: 12 mmol/L

## 2018-05-22 LAB — MAGNESIUM: Magnesium: 2.3 mg/dL (ref 1.7–2.4)

## 2018-05-22 LAB — CREATININE, URINE, RANDOM: Creatinine, Urine: 304.96 mg/dL

## 2018-05-22 LAB — APTT: aPTT: 29 seconds (ref 24–36)

## 2018-05-22 LAB — PROCALCITONIN: Procalcitonin: 2.07 ng/mL

## 2018-05-22 LAB — MRSA PCR SCREENING: MRSA by PCR: NEGATIVE

## 2018-05-22 LAB — CK: Total CK: 129 U/L (ref 38–234)

## 2018-05-22 MED ORDER — CHLORHEXIDINE GLUCONATE 0.12% ORAL RINSE (MEDLINE KIT)
15.0000 mL | Freq: Two times a day (BID) | OROMUCOSAL | Status: DC
  Administered 2018-05-22 – 2018-05-27 (×11): 15 mL via OROMUCOSAL

## 2018-05-22 MED ORDER — ORAL CARE MOUTH RINSE
15.0000 mL | OROMUCOSAL | Status: DC
  Administered 2018-05-22 – 2018-05-27 (×57): 15 mL via OROMUCOSAL

## 2018-05-22 NOTE — Progress Notes (Signed)
  Echocardiogram 2D Echocardiogram has been performed.  [name] 05/22/2018, 3:00 PM

## 2018-05-22 NOTE — Progress Notes (Signed)
[facility] Progress Note  Subjective: K+ down, last EKG 10pm last night showed NSR. Pt on vent , has woken up a bit, eyes open, coughing on vent. Minimal UOP overnight.   Vitals:   05/22/18 1145 05/22/18 1200 05/22/18 1215 05/22/18 1230  BP: (!) 143/74 (!) 144/76 (!) 150/78 (!) 142/72  Pulse: 81 86 83 82  Resp: 15 17 (!) 22 14  Temp: 97.9 F (36.6 C) 97.9 F (36.6 C) 97.7 F (36.5 C) 97.7 F (36.5 C)  TempSrc:      SpO2: 99% 98% 98% 98%  Weight:      Height:        Inpatient medications: . arformoterol  15 mcg Nebulization BID  . budesonide (PULMICORT) nebulizer solution  0.5 mg Nebulization BID  . chlorhexidine gluconate (MEDLINE KIT)  15 mL Mouth Rinse BID  . famotidine  20 mg Per Tube Daily  . heparin  5,000 Units Subcutaneous Q8H  . insulin aspart  2-6 Units Subcutaneous Q4H  . ipratropium-albuterol  3 mL Nebulization Q6H  . mouth rinse  15 mL Mouth Rinse 10 times per day   . fentaNYL infusion INTRAVENOUS 100 mcg/hr (05/22/18 1200)  . prismasol bgk replacement solution with potassium 400 mL/hr at 05/21/18 2142  . prismasol bgk replacement solution with potassium 200 mL/hr at 05/21/18 2142  . prismasol BGK 2/2.5 dialysis solution 2,000 mL/hr at 05/22/18 1041   alteplase, fentaNYL, heparin, sodium chloride  Iron/TIBC/Ferritin/ %Sat    Component Value Date/Time   IRON 32 (L) 02/16/2011 1026   TIBC 299 02/16/2011 1026   FERRITIN 82 02/16/2011 1026   IRONPCTSAT 11 (L) 02/16/2011 1026    Exam: Gen pt intubated, awake not following commands No rash, cyanosis or gangrene Sclera anicteric, throat w ETT  Chest clear ant and lat RRR no MRG noted Abd soft ntnd no mass or ascites +bs obese GU foley in place w/ small amts clear yellow urine MS no joint effusions or deformity Ext 2-3+ bilat LE edema from feet to knees,  no wounds or ulcers Neuro is sedated on vent    Home meds:  - furosemide 40 qd/ hydralazine 50 tid/ losartan 100 qd/ Kdur qd  -  aspirin 81/ isosorbide dinitrate 20 tid/ fenofibrate 135 qd  - insulin regular 40 u tid ac if BS> 90  - fluticasone-umeclidin-vilant qd   Date                Creat                           eGFR  2012-13          0.5- 0.7  2013- 2018     1.2- 1.9                                                  2019               1.4- 2.0                        26- 39  05/13/18           1.83                             29                                  05/16/18           1.63                             33  05/21/18           4.65                             9      05/14/18  UA negative   05/21/18  UA cloudy 100 prot, >50 rbc, 0-5 epi, 11-20 wbc  05/22/18   UA cloudy 100 prot 11-20 rbc, 0-5 epi, 6-10 wbc  Renal US 11/17/17 > 10.8/ 12.5 cm kidneys, normal echo and no hydro bilat  CXR 2/22 > vasc congestion, LLL infiltrate  Assessment/ Plan: 1. AKI on CKD3- baseline creat ~1.5, creat 4 on admission now. Pt oliguric, sepsis vs cardiorenal vs other. Holding ARB. Vol overload w/ low UNa, low FeNa c/w cardiac. +edematous. ECHO done, results pending.  CRRT done primarily for ^^K+. Not in shock, CVP 8 today. Will dc CRRT and do reg HD as needed.  2. Vol excess/ LE edema 3. CKD 3/4 - baseline creat 1.4- 2.0 4. Combined resp/ metabolic acidosis w/ CO2 retention 5. HTN - on ARB/ hydral/ lasix at home. Holding now.  6. Mental deficiency 7. DM2 on insulin long-term  [facility] 05/22/2018, 12:50 PM  Recent Labs  Lab 05/21/18 2000  05/21/18 2326 05/22/18 0441 05/22/18 1217  NA 127*   < > 131* 133* 135  K 7.5*   < > 6.4* 5.6* 4.8  CL 101  --  101 100  --   CO2 19*  --  21* 22  --   GLUCOSE 391*  --  215* 169*  --   BUN 89*  --  80* 64*  --   CREATININE 4.65*  --  4.08* 3.42*  --   CALCIUM 8.1*  --  7.9* 7.7*  --   PHOS  --   --   --  4.7*  --   ALBUMIN 2.5*  --   --  2.3*  --    < > = values in this interval not displayed.   Recent Labs  Lab  05/21/18 1515 05/21/18 2000  AST 30 93*  ALT 20 38  ALKPHOS 54 56  BILITOT 0.3 0.6  PROT 7.4 6.7   Recent Labs  Lab 05/21/18 1515  05/22/18 0441 05/22/18 1217  WBC 11.8*  --  8.4  --   NEUTROABS 9.3*  --   --   --   HGB 9.4*   < > 7.4* 8.2*  HCT 32.4*   < > 24.2* 24.0*  MCV 95.9  --  93.1  --   PLT 572*  --  321  --    < > = values in this interval not displayed.

## 2018-05-22 NOTE — Progress Notes (Addendum)
NAME:  [name], MRN:  [id number], DOB:  1956-02-17, LOS: 1 ADMISSION DATE:  05/21/2018, CONSULTATION DATE:  05/21/2018 REFERRING MD:  Dr. Lacinda Axon, CHIEF COMPLAINT:  Respiratory Distress   History of present illness   63 year old female presents from [place] to [facility] with reported one day of respiratory distress, placed on BiPAP by EMS. On arrival to ED found to be in [place]. Given Amiodarone bolus Cardiology consulted. Preparing for cardioversion, however patient converted into wide complex tachycardia. Intubated, during intubation had notable emesis. Given Vancomycin/Zosyn. K 7.5, Crt 4.31. Administered Bicarb, Calcium, Albuterol, Insulin/Dextrose. ABG 7.15/45. LA 3.2. Transferred to [facility] for further treatment.   Recent admission 2/14-2/17 with CAP. Noted to have AKI which improved. At discharge Cr 1.63. CT Chest concerning for neoplastic process of left lower lung base.  Was treated with a course of Levaquin  Past Medical History  Obesity, HTN, Mental Health Handicap, grade 1 diastolic heart failure, Asthma Underlying obstructive lung disease  Significant Hospital Events   2/22 > Presents to ED   Consults:  PCCM 2/22  Procedures:  ETT 2/22 >>   Significant Diagnostic Tests:  CT Chest 2/14 > Masslike consolidation measuring 5 x 8.2 cm over the left lower lobe  CXR 2/22 > Endotracheal tube just into the right mainstem bronchus. Recommend retracting 2-3 cm. Increasing left perihilar and lower lobe opacity could reflect associated atelectasis related to right mainstem intubation.  Micro Data:  Blood 2/22 >> Tracheal Asp 2/22 >> U/A 2/22 > Negative   Antimicrobials:  Cefepime 2/22 >> Vancomycin 2/22 >>    Interim history/subjective:  Currently on CRRT Patient awake and interactive  Objective   Blood pressure 136/71, pulse 85, temperature 98.2 F (36.8 C), resp. rate (!) 21, height 5\' 4"  (1.626 m), weight 128.9 kg, SpO2 97 %.    Vent Mode: CPAP;PSV FiO2 (%):  [40  %-50 %] 40 % Set Rate:  [18 bmp-22 bmp] 22 bmp Vt Set:  [430 mL-450 mL] 430 mL PEEP:  [5 cmH20] 5 cmH20 Pressure Support:  [12 cmH20] 12 cmH20 Plateau Pressure:  [18 cmH20-25 cmH20] 19 cmH20   Intake/Output Summary (Last 24 hours) at 05/22/2018 1129 Last data filed at 05/22/2018 1100 Gross per 24 hour  Intake 529.14 ml  Output 824 ml  Net -294.86 ml   Filed Weights   05/21/18 1512 05/22/18 0500  Weight: 115.6 kg 128.9 kg    Examination: General: Adult female, endotracheal tube in place on vent, does not appear to be in respiratory distress HENT: Dry MM  Lungs: Rhonchi, no wheeze/crackles  Cardiovascular: S1-S2 appreciated, RRR, no MRG  Abdomen: Obese, soft, non-tender  Extremities: +3 pedal edema  Neuro: Awake, follows commands GU: foley in place  Resolved Hospital Problem list     Assessment & Plan:   Acute hypoxic/hypercarbic respiratory failure Left lower lobe consolidation versus mass lesion-recent concern for neoplastic process History of COPD -Continue vent support-tolerating CPAP pressure support -Chest x-ray reviewed showing haziness at the base-we will follow -Continue bronchodilator treatments follow ABG  Junctional Rhythm with prolonged QTC in setting of hyperkalemia  Episode of V tach Decompensated Heart Failure   Grade 1 Diastolic Heart Failure H/O HTN Plan   -Cardiac Monitoring  -Maintain MAP >65  -ECHO scheduled/pending -Repeat EKG -Hold home cardiac medications - correcting predisposing electrolyte derangement   Acute on Chronic Kidney Disease with Hyperkalemia  -dehydration and medication effect? Does not appear septic, takes K supplement at home  Non-Gap Metabolic Acidosis Lactic  Acidosis   Plan  -Temporized with Bicarb/Insulin/Calcium at OSH -Started on CRRT, seen by Dr. Jonnie Finner -Trend BMP  -follow renal US  Hyperglycemia  DM Plan  -SSI   Leukocytosis Recent CAP Treatment  Plan -PAN Culture -Received Cefepime/Vancomycin in ED    -Trend WBC and Fever Curve  -Blood culture 2/22 2020-negative so far -MRSA by PCR negative -PCT  -Hold off on antibiotics at the present time, was recently fully treated with a course of Levaquin for community-acquired pneumonia  Abnormal CT scan of the chest showing left lower lobe masslike lesion with airway compromise An underlying process was present from 2014, linear atelectasis with preservation of the airway in 2014 May be progressive process -Follow-up CT versus PET scanning in 4 weeks -Will likely need invasive intervention when more stable  Chronic Anemia  Plan -Trend CBC  -Heparin for DVT ppx  -May require transfusion  Sedation Needs  Plan  -RASS Goal 0/-1 -Titrate Fentanyl for RASS goal   History of obstructive sleep apnea from a sleep study from 09/17/2017 Titrated to a BiPAP of 18/12  She is not ready yet for extubation  Continue management for acute kidney injury, hyperkalemia  Best practice:  Diet: NPO Pain/Anxiety/Delirium protocol  VAP protocol  DVT prophylaxis: Heparin SQ GI prophylaxis: Pepcid  Glucose control: SSI  Mobility: Bedrest  Code Status: Full Code  Family Communication: [place]  Disposition: Continue ICU care   Labs   CBC: Recent Labs  Lab 05/16/18 0407 05/21/18 1515 05/21/18 1958 05/21/18 2019 05/22/18 0441  WBC 8.7 11.8*  --   --  8.4  NEUTROABS  --  9.3*  --   --   --   HGB 9.3* 9.4* 8.8* 8.2* 7.4*  HCT 31.2* 32.4* 26.0* 24.0* 24.2*  MCV 96.9 95.9  --   --  93.1  PLT 455* 572*  --   --  967    Basic Metabolic Panel: Recent Labs  Lab 05/16/18 0407 05/21/18 1515 05/21/18 1958 05/21/18 2000 05/21/18 2019 05/21/18 2326 05/22/18 0441  NA 135 126* 127* 127* 129* 131* 133*  K 5.1 >7.5* 7.5* 7.5* 7.0* 6.4* 5.6*  CL 101 100  --  101  --  101 100  CO2 24 15*  --  19*  --  21* 22  GLUCOSE 77 322*  --  391*  --  215* 169*  BUN 24* 91*  --  89*  --  80* 64*  CREATININE 1.63* 4.31*  --  4.65*  --  4.08* 3.42*   CALCIUM 8.9 8.4*  --  8.1*  --  7.9* 7.7*  MG  --   --   --   --   --   --  2.3  PHOS  --   --   --   --   --   --  4.7*   GFR: Estimated Creatinine Clearance: 22.7 mL/min (A) (by C-G formula based on SCr of 3.42 mg/dL (H)). Recent Labs  Lab 05/16/18 0407 05/21/18 1515 05/21/18 1544 05/21/18 1840 05/21/18 2000 05/22/18 0441  PROCALCITON  --   --   --   --  0.56 2.07  WBC 8.7 11.8*  --   --   --  8.4  LATICACIDVEN  --   --  3.2* 1.7  --   --     Liver Function Tests: Recent Labs  Lab 05/21/18 1515 05/21/18 2000 05/22/18 0441  AST 30 93*  --   ALT 20 38  --  ALKPHOS 54 56  --   BILITOT 0.3 0.6  --   PROT 7.4 6.7  --   ALBUMIN 2.9* 2.5* 2.3*   No results for input(s): LIPASE, AMYLASE in the last 168 hours. No results for input(s): AMMONIA in the last 168 hours.  ABG    Component Value Date/Time   PHART 7.320 (L) 05/22/2018 0308   PCO2ART 41.7 05/22/2018 0308   PO2ART 109 (H) 05/22/2018 0308   HCO3 20.9 05/22/2018 0308   TCO2 24 05/21/2018 2019   ACIDBASEDEF 4.2 (H) 05/22/2018 0308   O2SAT 97.8 05/22/2018 0308     Coagulation Profile: No results for input(s): INR, PROTIME in the last 168 hours.  Cardiac Enzymes: Recent Labs  Lab 05/21/18 1515  TROPONINI <0.03     CBG: Recent Labs  Lab 05/21/18 1957 05/21/18 2058 05/21/18 2329 05/22/18 0404 05/22/18 0744  GLUCAP 356* 319* 189* 160* 130*    Review of Systems:   Unable to review as patient is intubated/sedated   Past Medical History  She,  has a past medical history of Arthritis, Asthma, CHF (congestive heart failure) ([place]), Diabetes mellitus without complication ([place]), Hypertension, Iron deficiency anemia (10/16/2010), and Mental handicap (10/16/2010).   Surgical History    Past Surgical History:  Procedure Laterality Date  . ABDOMINAL HYSTERECTOMY    . CESAREAN SECTION    . CHOLECYSTECTOMY    . COLONOSCOPY  08/2010   normal TI, sigmoid polyp (adenoma ). Next TCS due  08/2015,  .  ESOPHAGOGASTRODUODENOSCOPY  08/2010   antral and duodenal erosions s/p bx (chronic gastritis, no h.pylori, no celiac dz ), hiatal hernia  . KNEE SURGERY     right knee @ 63 years of age  . LEFT AND RIGHT HEART CATHETERIZATION WITH CORONARY ANGIOGRAM N/A 09/21/2011   Procedure: LEFT AND RIGHT HEART CATHETERIZATION WITH CORONARY ANGIOGRAM;  Surgeon: Birdie Riddle, MD;  Location: [place] CATH LAB;  Service: Cardiovascular;  Laterality: N/A;     Social History   reports that she quit smoking about 22 years ago. Her smoking use included cigarettes. She smoked 1.00 pack per day for 0.00 years. She has never used smokeless tobacco. She reports that she does not drink alcohol or use drugs.   Family History   Her family history includes Diabetes in her mother; Hypertension in her father, mother, and son; Kidney disease in an other family member. There is no history of Colon cancer.   Allergies No Known Allergies    Critical care time: 30 minutes spent evaluating patient, reviewing records, reviewing radiological data, discussed with Dr. Jonnie Finner

## 2018-05-22 NOTE — Progress Notes (Signed)
CRITICAL VALUE ALERT  Critical Value:  Potassium 6.4   Date & Time Notied:  0010  Provider Notified: ELink  Orders Received/Actions taken:

## 2018-05-22 NOTE — Progress Notes (Signed)
Pt arrived from [facility] via [place]. Pt drowsy following commands nods head appropriately. Pt orally intubated, ogt in place, fentanyl and versed drip infusing RPIV. NSR on cardiac monitor. Louretta Parma, NP to bedside.

## 2018-05-23 DIAGNOSIS — Z9911 Dependence on respirator [ventilator] status: Secondary | ICD-10-CM

## 2018-05-23 DIAGNOSIS — J690 Pneumonitis due to inhalation of food and vomit: Secondary | ICD-10-CM

## 2018-05-23 DIAGNOSIS — J9601 Acute respiratory failure with hypoxia: Secondary | ICD-10-CM

## 2018-05-23 LAB — CBC
HCT: 22.6 % — ABNORMAL LOW (ref 36.0–46.0)
Hemoglobin: 6.7 g/dL — CL (ref 12.0–15.0)
MCH: 28 pg (ref 26.0–34.0)
MCHC: 29.6 g/dL — ABNORMAL LOW (ref 30.0–36.0)
MCV: 94.6 fL (ref 80.0–100.0)
Platelets: 252 10*3/uL (ref 150–400)
RBC: 2.39 MIL/uL — ABNORMAL LOW (ref 3.87–5.11)
RDW: 14.5 % (ref 11.5–15.5)
WBC: 8.1 10*3/uL (ref 4.0–10.5)
nRBC: 0 % (ref 0.0–0.2)

## 2018-05-23 LAB — BASIC METABOLIC PANEL
Anion gap: 8 (ref 5–15)
BUN: 59 mg/dL — ABNORMAL HIGH (ref 8–23)
CO2: 24 mmol/L (ref 22–32)
Calcium: 7.9 mg/dL — ABNORMAL LOW (ref 8.9–10.3)
Chloride: 101 mmol/L (ref 98–111)
Creatinine, Ser: 4.24 mg/dL — ABNORMAL HIGH (ref 0.44–1.00)
GFR calc Af Amer: 12 mL/min — ABNORMAL LOW (ref >60)
GFR calc non Af Amer: 11 mL/min — ABNORMAL LOW (ref >60)
Glucose, Bld: 139 mg/dL — ABNORMAL HIGH (ref 70–99)
Potassium: 5.6 mmol/L — ABNORMAL HIGH (ref 3.5–5.1)
Sodium: 133 mmol/L — ABNORMAL LOW (ref 135–145)

## 2018-05-23 LAB — GLUCOSE, CAPILLARY
Glucose-Capillary: 102 mg/dL — ABNORMAL HIGH (ref 70–99)
Glucose-Capillary: 103 mg/dL — ABNORMAL HIGH (ref 70–99)
Glucose-Capillary: 111 mg/dL — ABNORMAL HIGH (ref 70–99)
Glucose-Capillary: 122 mg/dL — ABNORMAL HIGH (ref 70–99)
Glucose-Capillary: 123 mg/dL — ABNORMAL HIGH (ref 70–99)
Glucose-Capillary: 128 mg/dL — ABNORMAL HIGH (ref 70–99)
Glucose-Capillary: 131 mg/dL — ABNORMAL HIGH (ref 70–99)

## 2018-05-23 LAB — PROCALCITONIN: Procalcitonin: 3.87 ng/mL

## 2018-05-23 LAB — MAGNESIUM: Magnesium: 2.4 mg/dL (ref 1.7–2.4)

## 2018-05-23 LAB — ABO/RH: ABO/RH(D): A POS

## 2018-05-23 LAB — PREPARE RBC (CROSSMATCH)

## 2018-05-23 MED ORDER — SODIUM CHLORIDE 0.9 % IV SOLN
2.0000 g | Freq: Once | INTRAVENOUS | Status: AC
  Administered 2018-05-23: 2 g via INTRAVENOUS
  Filled 2018-05-23: qty 2

## 2018-05-23 MED ORDER — HEPARIN SODIUM (PORCINE) 1000 UNIT/ML IJ SOLN
INTRAMUSCULAR | Status: AC
  Administered 2018-05-23: 2400 [IU] via INTRAVENOUS_CENTRAL
  Filled 2018-05-23: qty 4

## 2018-05-23 MED ORDER — SODIUM CHLORIDE 0.9 % IV SOLN
1.5000 g | INTRAVENOUS | Status: DC
  Administered 2018-05-23 – 2018-05-24 (×2): 1.5 g via INTRAVENOUS
  Filled 2018-05-23 (×2): qty 1.5

## 2018-05-23 MED ORDER — SODIUM CHLORIDE 0.9 % IV SOLN: 1.5000 g | INTRAVENOUS | Status: DC

## 2018-05-23 MED ORDER — PHENYLEPHRINE HCL-NACL 10-0.9 MG/250ML-% IV SOLN
0.0000 ug/min | INTRAVENOUS | Status: DC
  Administered 2018-05-23: 10 ug/min via INTRAVENOUS
  Administered 2018-05-23: 30 ug/min via INTRAVENOUS
  Filled 2018-05-23 (×3): qty 250

## 2018-05-23 MED ORDER — SODIUM CHLORIDE 0.9% IV SOLUTION
Freq: Once | INTRAVENOUS | Status: AC
  Administered 2018-05-23: 07:00:00 via INTRAVENOUS

## 2018-05-23 MED ORDER — VANCOMYCIN HCL 10 G IV SOLR
2000.0000 mg | Freq: Once | INTRAVENOUS | Status: AC
  Administered 2018-05-23: 2000 mg via INTRAVENOUS
  Filled 2018-05-23: qty 2000

## 2018-05-23 MED ORDER — SODIUM CHLORIDE 0.9 % IV SOLN
INTRAVENOUS | Status: DC | PRN
  Administered 2018-05-23: 500 mL via INTRAVENOUS
  Administered 2018-05-29: 250 mL via INTRAVENOUS
  Administered 2018-06-06: 16:00:00 via INTRAVENOUS

## 2018-05-23 MED ORDER — HEPARIN SODIUM (PORCINE) 1000 UNIT/ML DIALYSIS
2400.0000 [IU] | INTRAMUSCULAR | Status: DC | PRN
  Administered 2018-05-23: 2400 [IU] via INTRAVENOUS_CENTRAL

## 2018-05-23 MED ORDER — VANCOMYCIN VARIABLE DOSE PER UNSTABLE RENAL FUNCTION (PHARMACIST DOSING): Status: DC

## 2018-05-23 MED ORDER — VITAL HIGH PROTEIN PO LIQD
1000.0000 mL | ORAL | Status: DC
  Administered 2018-05-23: 1000 mL

## 2018-05-23 MED ORDER — SODIUM CHLORIDE 0.9 % IV SOLN
1.5000 g | INTRAVENOUS | Status: DC
  Filled 2018-05-23: qty 1.5

## 2018-05-23 MED ORDER — SODIUM CHLORIDE 0.9 % IV SOLN: 1.0000 g | INTRAVENOUS | Status: DC

## 2018-05-23 MED ORDER — HEPARIN SODIUM (PORCINE) 1000 UNIT/ML DIALYSIS: 20.0000 [IU]/kg | INTRAMUSCULAR | Status: DC | PRN

## 2018-05-23 MED FILL — Medication: Qty: 1 | Status: AC

## 2018-05-23 NOTE — Progress Notes (Signed)
CRITICAL VALUE ALERT  Critical Value:  Hemoglobin 6.7  Date & Time Notied:  3312   05/23/2018  Provider Notified: Warren Lacy  Orders Received/Actions taken:

## 2018-05-23 NOTE — Progress Notes (Signed)
Received pt from [place], [place]  HD tx initiated @ 1730 via HD cath by Andria Meuse, RN  w/o problem, per report Bilat ports: pull/push/flush equally w/o problem, per report VSS, per report Will continue to monitor while on HD tx

## 2018-05-23 NOTE — Progress Notes (Signed)
Pharmacy Antibiotic Note  [name] is a 63 y.o. female admitted on 05/21/2018 with sepsis.  Pharmacy has been consulted for vancomycin and cefepime dosing. She received vanc and cefepime afternoon 2/22.  She was on CRRT but that was dced yesterday and plan to do iHD.  Plan: Vancomycin 2gm IV x 1.  F/u HD plans for redose Cefepime 2gm IV x 1 then 1gm IV q24 hours F/u HD plans, cultures and clinical course  Height: 5\' 4"  (162.6 cm) Weight: 278 lb 14.1 oz (126.5 kg) IBW/kg (Calculated) : 54.7  Temp (24hrs), Avg:98.5 F (36.9 C), Min:97.7 F (36.5 C), Max:99.5 F (37.5 C)  Recent Labs  Lab 05/21/18 1515 05/21/18 1544 05/21/18 1840 05/21/18 2000 05/21/18 2326 05/22/18 0441 05/22/18 1156 05/23/18 0329  WBC 11.8*  --   --   --   --  8.4  --  8.1  CREATININE 4.31*  --   --  4.65* 4.08* 3.42* 2.91* 4.24*  LATICACIDVEN  --  3.2* 1.7  --   --   --   --   --     Estimated Creatinine Clearance: 18.1 mL/min (A) (by C-G formula based on SCr of 4.24 mg/dL (H)).    No Known Allergies   Thank you for allowing pharmacy to be a part of this patient's care.  Beverlee Nims 05/23/2018 5:53 AM

## 2018-05-23 NOTE — Progress Notes (Signed)
HD tx completed @ 2100 w/o problem UF goal met Blood rinsed back VSS Report given to Levonne Spiller, RN

## 2018-05-23 NOTE — Progress Notes (Signed)
[place] Progress Note Patient Name: [name] DOB: Jun 08, 1955 MRN: [id number]   Date of Service  05/23/2018  HPI/Events of Note  Anemia - Hgb = 6.7.   eICU Interventions  Will transfuse 1 unit PRBC now.      Intervention Category Major Interventions: Other:  Yanky Vanderburg Cornelia Copa 05/23/2018, 4:21 AM

## 2018-05-23 NOTE — Progress Notes (Signed)
NAME:  [name], MRN:  [id number], DOB:  04-11-1955, LOS: 2 ADMISSION DATE:  05/21/2018, CONSULTATION DATE:  05/21/2018 REFERRING MD:  Dr. Lacinda Axon, CHIEF COMPLAINT:  Respiratory Distress   History of present illness   63 year old female presents from [place] to [facility] with reported one day of respiratory distress, placed on BiPAP by EMS. On arrival to ED found to be in [place]. Given Amiodarone bolus Cardiology consulted. Preparing for cardioversion, however patient converted into wide complex tachycardia. Intubated, during intubation had notable emesis. Given Vancomycin/Zosyn. K 7.5, Crt 4.31. Administered Bicarb, Calcium, Albuterol, Insulin/Dextrose. ABG 7.15/45. LA 3.2. Transferred to [facility] for further treatment.   Recent admission 2/14-2/17 with CAP. Noted to have AKI which improved. At discharge Cr 1.63. CT Chest concerning for neoplastic process of left lower lung base.  Was treated with a course of Levaquin  Past Medical History  Obesity, HTN, Mental Health Handicap, grade 1 diastolic heart failure, Asthma Underlying obstructive lung disease  Significant Hospital Events   2/22 > Presents to ED   Consults:  PCCM 2/22  Procedures:  ETT 2/22 >>   Significant Diagnostic Tests:  CT Chest 2/14 > Masslike consolidation measuring 5 x 8.2 cm over the left lower lobe  CXR 2/22 > Endotracheal tube just into the right mainstem bronchus. Recommend retracting 2-3 cm. Increasing left perihilar and lower lobe opacity could reflect associated atelectasis related to right mainstem intubation.  Micro Data:  Blood 2/22 >> Tracheal Asp 2/22 >> U/A 2/22 > Negative   Antimicrobials:  Cefepime 2/22 >> 224 stop Vancomycin 2/22 >> 2-24 stop Unasyn 224 2020 x 5 days then stop  Interim history/subjective:  No issues overnight.  Failed SBT this morning.  Became very tachypneic and agitated.  Also while asleep her blood pressure would continue to drift off within the mean arterial pressure  less than 60.  Patient was restarted on low-dose phenylephrine.  Objective   Blood pressure 135/61, pulse 81, temperature 98.3 F (36.8 C), temperature source Oral, resp. rate 18, height 5\' 4"  (1.626 m), weight 126.5 kg, SpO2 100 %. CVP:  [8 mmHg-14 mmHg] 14 mmHg  Vent Mode: PRVC FiO2 (%):  [40 %] 40 % Set Rate:  [22 bmp] 22 bmp Vt Set:  [430 mL] 430 mL PEEP:  [5 cmH20] 5 cmH20 Pressure Support:  [12 cmH20] 12 cmH20 Plateau Pressure:  [15 cmH20-22 cmH20] 22 cmH20   Intake/Output Summary (Last 24 hours) at 05/23/2018 1139 Last data filed at 05/23/2018 1015 Gross per 24 hour  Intake 1351.29 ml  Output 161 ml  Net 1190.29 ml   Filed Weights   05/21/18 1512 05/22/18 0500 05/23/18 0347  Weight: 115.6 kg 128.9 kg 126.5 kg    Examination: General: Elderly female, intubated, mechanical ventilation HENT: NCAT, sclera clear, tracking appropriately Neck: Endotracheal tube in place Lungs: Bilateral ventilated breath sounds, no crackles, no wheeze Cardiovascular: Regular rate and rhythm, S1-S2, no MRG's Abdomen: Wheeze abdomen, bowel sounds present, soft, nontender Extremities: Edema lower extremities bilaterally Neuro: Alert, follows commands able to answer yes/no questions by head nods on the ventilator GU: Foley in place  Resolved Hospital Problem list     Assessment & Plan:   Acute hypoxic/hypercarbic respiratory failure Left lower lobe consolidation versus mass lesion-recent concern for neoplastic process -Prior CT imaging with cut off of the left lower lobe proximal bronchus with evidence of hilar adenopathy concerning for neoplasm History of COPD -Continue current ventilatory support, will attempt SBT SAT daily - Will  likely need bronchoscopic evaluation however just had episode of VT. -Will likely need to be completed at bedside under ICU evaluation. -We will need to obtain consent from family -Continue bronchodilators  Junctional Rhythm with prolonged QTC in setting of  hyperkalemia  Episode of V tach Decompensated Heart Failure   Grade 1 Diastolic Heart Failure H/O HTN Plan   -Continue cardiac telemetry monitoring -Wean off of Neo-Synephrine to maintain mean arterial pressure greater than 65 -Echo pending - Holding home cardiac medications -Likely arrhythmias secondary to abnormal electrolyte derangements. -Appreciate nephrology recommendations regarding management of electrolytes and need for dialysis.   Acute on Chronic Kidney Disease with Hyperkalemia  Metabolic acidosis Lactic Acidosis   Plan  -Initially temporized with bicarb, insulin and calcium -Started CRRT per nephrology -We will continue to follow per nephrology recommendations with any need for continued renal replacement. -Renal ultrasound no hydronephrosis, no mass, adrenal adenoma 3.6 x 3.6 x 2.3  Hyperglycemia  DM Plan  -SSI  Leukocytosis Recent CAP Treatment  Plan - We will de-escalate antimicrobials to Unasyn to complete additional 5 days -All cultures so far are negative - MRSA PCR negative -PCT is elevated however does have CKD we will continue to observe.  Abnormal CT scan of the chest showing left lower lobe masslike lesion with airway compromise An underlying process was present from 2014, linear atelectasis with preservation of the airway in 2014 May be progressive process -I think would benefit from bronchoscopy for further evaluation at some point. -Patient is a ward of the state and will need consent prior to having this done. -She did recently have a V. tach/wide complex tachycardia event presumed related to electrolyte abnormalities. -We will need to ensure all electrolytes are stable and does not have any cardiac irritability be prior to proceeding with a procedure.-However I do think it would be safe and wise to have this completed while she is in the intensive care unit for observation versus done electively outpatient.  Chronic Anemia  Plan Follow  CBC -Continue DVT prophylaxis  Sedation Needs  Plan  Continue fentanyl dosing PRN to maintain goal RA SS  History of obstructive sleep apnea from a sleep study from 09/17/2017 Once extubated can go back to home nightly BiPAP 18/12.   Best practice:  Diet: NPO Pain/Anxiety/Delirium protocol  VAP protocol  DVT prophylaxis: Heparin SQ GI prophylaxis: Pepcid  Glucose control: SSI  Mobility: Bedrest  Code Status: Full Code  Family Communication: Ward of State  Disposition: Continue ICU care   Labs   CBC: Recent Labs  Lab 05/21/18 1515 05/21/18 1958 05/21/18 2019 05/22/18 0441 05/22/18 1217 05/23/18 0329  WBC 11.8*  --   --  8.4  --  8.1  NEUTROABS 9.3*  --   --   --   --   --   HGB 9.4* 8.8* 8.2* 7.4* 8.2* 6.7*  HCT 32.4* 26.0* 24.0* 24.2* 24.0* 22.6*  MCV 95.9  --   --  93.1  --  94.6  PLT 572*  --   --  321  --  401    Basic Metabolic Panel: Recent Labs  Lab 05/21/18 2000  05/21/18 2326 05/22/18 0441 05/22/18 1156 05/22/18 1217 05/23/18 0329  NA 127*   < > 131* 133* 133* 135 133*  K 7.5*   < > 6.4* 5.6* 4.8 4.8 5.6*  CL 101  --  101 100 101  --  101  CO2 19*  --  21* 22 23  --  24  GLUCOSE 391*  --  215* 169* 119*  --  139*  BUN 89*  --  80* 64* 49*  --  59*  CREATININE 4.65*  --  4.08* 3.42* 2.91*  --  4.24*  CALCIUM 8.1*  --  7.9* 7.7* 8.0*  --  7.9*  MG  --   --   --  2.3  --   --  2.4  PHOS  --   --   --  4.7*  --   --   --    < > = values in this interval not displayed.   GFR: Estimated Creatinine Clearance: 18.1 mL/min (A) (by C-G formula based on SCr of 4.24 mg/dL (H)). Recent Labs  Lab 05/21/18 1515 05/21/18 1544 05/21/18 1840 05/21/18 2000 05/22/18 0441 05/23/18 0329  PROCALCITON  --   --   --  0.56 2.07 3.87  WBC 11.8*  --   --   --  8.4 8.1  LATICACIDVEN  --  3.2* 1.7  --   --   --     Liver Function Tests: Recent Labs  Lab 05/21/18 1515 05/21/18 2000 05/22/18 0441  AST 30 93*  --   ALT 20 38  --   ALKPHOS 54 56  --    BILITOT 0.3 0.6  --   PROT 7.4 6.7  --   ALBUMIN 2.9* 2.5* 2.3*   No results for input(s): LIPASE, AMYLASE in the last 168 hours. No results for input(s): AMMONIA in the last 168 hours.  ABG    Component Value Date/Time   PHART 7.337 (L) 05/22/2018 1217   PCO2ART 47.6 05/22/2018 1217   PO2ART 98.0 05/22/2018 1217   HCO3 25.6 05/22/2018 1217   TCO2 27 05/22/2018 1217   ACIDBASEDEF 1.0 05/22/2018 1217   O2SAT 97.0 05/22/2018 1217     Coagulation Profile: No results for input(s): INR, PROTIME in the last 168 hours.  Cardiac Enzymes: Recent Labs  Lab 05/21/18 1515 05/22/18 1156  CKTOTAL  --  129  TROPONINI <0.03  --      CBG: Recent Labs  Lab 05/22/18 1957 05/22/18 2328 05/23/18 0332 05/23/18 0750 05/23/18 1130  GLUCAP 126* 130* 128* 123* 122*    This patient is critically ill with multiple organ system failure; which, requires frequent high complexity decision making, assessment, support, evaluation, and titration of therapies. This was completed through the application of advanced monitoring technologies and extensive interpretation of multiple databases. During this encounter critical care time was devoted to patient care services described in this note for 38 minutes.   Garner Nash, DO [facility] 05/23/2018 12:01 PM  Personal pager: [phone] If unanswered, please page CCM On-call: [phone]

## 2018-05-23 NOTE — Progress Notes (Signed)
[facility] Progress Note Patient Name: [name] DOB: September 29, 1955 MRN: [id number]   Date of Service  05/23/2018  HPI/Events of Note  Hypotension - BP = 81/38 with a MAP = 48. CVP = 10.   eICU Interventions  Will order: 1. Phenylephrine IV infusion. Titrate to MAP > 65.      Intervention Category Major Interventions: Hypotension - evaluation and management  Reece Mcbroom Eugene 05/23/2018, 3:20 AM

## 2018-05-23 NOTE — Progress Notes (Signed)
Social worker from legal guardianship co called to inquire about pt. Social worker's name was [name], and she can be reached at [phone] ext. [phone] during working hours.

## 2018-05-23 NOTE — Progress Notes (Signed)
Initial Nutrition Assessment  DOCUMENTATION CODES:   Morbid obesity  INTERVENTION:    Vital High Protein at 60 ml/h (1440 ml per day)  Provides 1440 kcal, 126 gm protein, 1204 ml free water daily  NUTRITION DIAGNOSIS:   Inadequate oral intake related to inability to eat as evidenced by NPO status.  GOAL:   Provide needs based on ASPEN/SCCM guidelines  MONITOR:   Vent status, Labs, I & O's, TF tolerance  REASON FOR ASSESSMENT:   Ventilator, Consult( Verbal) Enteral/tube feeding initiation and management  ASSESSMENT:   63 yo female with PMH of HTN, DM, anemia, CHF, mental handicap, asthma, arthritis who was admitted from LTC to [facility] with respiratory distress, V Tach; intubated and transferred to [facility] 2/22.  Spoke with CCM regarding starting enteral nutrition. OGT in place. RD to order TF.   Patient is currently intubated on ventilator support MV: 9.1 L/min Temp (24hrs), Avg:99.1 F (37.3 C), Min:98.1 F (36.7 C), Max:99.5 F (37.5 C)   Labs reviewed. Sodium 133 (L), potassium 5.6 (H), BUN 59 (H), creatinine 4.24 (H), Hgb 6.7 (L) CBG's: 128-123-122 Medications reviewed and include Novolog, neosynephrine.   NUTRITION - FOCUSED PHYSICAL EXAM:    Most Recent Value  Orbital Region  No depletion  Upper Arm Region  No depletion  Thoracic and Lumbar Region  No depletion  Buccal Region  No depletion  Temple Region  No depletion  Clavicle Bone Region  No depletion  Clavicle and Acromion Bone Region  No depletion  Scapular Bone Region  Unable to assess  Dorsal Hand  No depletion  Patellar Region  No depletion  Anterior Thigh Region  No depletion  Posterior Calf Region  No depletion  Edema (RD Assessment)  Severe  Hair  Reviewed  Eyes  Unable to assess  Mouth  Unable to assess  Skin  Reviewed  Nails  Reviewed       Diet Order:   Diet Order    None      EDUCATION NEEDS:   No education needs have been identified at this time  Skin:  Skin Assessment:  Reviewed RN Assessment  Last BM:  2/22  Height:   Ht Readings from Last 1 Encounters:  05/21/18 5\' 4"  (1.626 m)    Weight:   Wt Readings from Last 1 Encounters:  05/23/18 126.5 kg    Ideal Body Weight:  54.5 kg  BMI:  Body mass index is 47.87 kg/m.  Estimated Nutritional Needs:   Kcal:  1390-1770  Protein:  110-135 gm  Fluid:  >/= 1.7 L    Molli Barrows, RD, LDN, CNSC Pager [phone] After Hours Pager [phone]

## 2018-05-23 NOTE — Progress Notes (Signed)
[facility] Progress Note Patient Name: [name] DOB: 11/12/1955 MRN: [id number]   Date of Service  05/23/2018  HPI/Events of Note  Procalcitonin = 3.87. Physician progress note indicates that patient is on Vancomycin and Cefepime, however, no antibiotics are ordered.   eICU Interventions  Will order: 1. Vancomycin and Cefepime per pharmacy consult.     Intervention Category Major Interventions: Infection - evaluation and management  Kingslee Mairena Eugene 05/23/2018, 5:41 AM

## 2018-05-23 NOTE — Progress Notes (Signed)
Patient ID: [name], female   DOB: October 27, 1955, 63 y.o.   MRN: [id number] S: awake and intubated, no events overnight O:BP (!) 155/75   Pulse 99   Temp 99 F (37.2 C)   Resp 20   Ht _0  (1.626 m)   Wt 126.5 kg   SpO2 99%   BMI 47.87 kg/m   Intake/Output Summary (Last 24 hours) at 05/23/2018 1027 Last data filed at 05/23/2018 1015 Gross per 24 hour  Intake 1361.12 ml  Output 234 ml  Net 1127.12 ml   Intake/Output: I/O last 3 completed shifts: In: 563.8 [I.V.:473.3; IV Piggyback:90.5] Out: 973 [Urine:118; Other:855]  Intake/Output this shift:  Total I/O In: 918.1 [I.V.:133.1; Blood:416; IV Piggyback:369] Out: 12 [Urine:12] Weight change: 10.9 kg Gen: intubated but awake CVS: no rub Resp: cta Abd: +BS, soft, obese, NT Ext: 1+ edema  Recent Labs  Lab 05/21/18 1515  05/21/18 2000 05/21/18 2019 05/21/18 2326 05/22/18 0441 05/22/18 1156 05/22/18 1217 05/23/18 0329  NA 126*   < > 127* 129* 131* 133* 133* 135 133*  K >7.5*   < > 7.5* 7.0* 6.4* 5.6* 4.8 4.8 5.6*  CL 100  --  101  --  101 100 101  --  101  CO2 15*  --  19*  --  21* 22 23  --  24  GLUCOSE 322*  --  391*  --  215* 169* 119*  --  139*  BUN 91*  --  89*  --  80* 64* 49*  --  59*  CREATININE 4.31*  --  4.65*  --  4.08* 3.42* 2.91*  --  4.24*  ALBUMIN 2.9*  --  2.5*  --   --  2.3*  --   --   --   CALCIUM 8.4*  --  8.1*  --  7.9* 7.7* 8.0*  --  7.9*  PHOS  --   --   --   --   --  4.7*  --   --   --   AST 30  --  93*  --   --   --   --   --   --   ALT 20  --  38  --   --   --   --   --   --    < > = values in this interval not displayed.   Liver Function Tests: Recent Labs  Lab 05/21/18 1515 05/21/18 2000 05/22/18 0441  AST 30 93*  --   ALT 20 38  --   ALKPHOS 54 56  --   BILITOT 0.3 0.6  --   PROT 7.4 6.7  --   ALBUMIN 2.9* 2.5* 2.3*   No results for input(s): LIPASE, AMYLASE in the last 168 hours. No results for input(s): AMMONIA in the last 168 hours. CBC: Recent Labs  Lab  05/21/18 1515  05/22/18 0441 05/22/18 1217 05/23/18 0329  WBC 11.8*  --  8.4  --  8.1  NEUTROABS 9.3*  --   --   --   --   HGB 9.4*   < > 7.4* 8.2* 6.7*  HCT 32.4*   < > 24.2* 24.0* 22.6*  MCV 95.9  --  93.1  --  94.6  PLT 572*  --  321  --  252   < > = values in this interval not displayed.   Cardiac Enzymes: Recent Labs  Lab 05/21/18 1515 05/22/18 1156  CKTOTAL  --  129  TROPONINI <0.03  --    CBG: Recent Labs  Lab 05/22/18 1602 05/22/18 1957 05/22/18 2328 05/23/18 0332 05/23/18 0750  GLUCAP 118* 126* 130* 128* 123*    Iron Studies: No results for input(s): IRON, TIBC, TRANSFERRIN, FERRITIN in the last 72 hours. Studies/Results: US Renal  Result Date: 05/21/2018 CLINICAL DATA:  Acute kidney injury. EXAM: RENAL / URINARY TRACT ULTRASOUND COMPLETE COMPARISON:  11/17/2017 FINDINGS: Right Kidney: Renal measurements: 10.4 x 5.4 x 4.9 cm = volume: 142 mL . Echogenicity within normal limits. No mass or hydronephrosis visualized. Left Kidney: Renal measurements: 11.5 x 6.0 x 5.1 cm = volume: 181 mL. Echogenicity within normal limits. No mass or hydronephrosis visualized. Bladder: Foley catheter in place, decompressed. The previously seen adrenal nodule/adenoma noted, measuring 3.6 x 3.6 x 2.3 cm. IMPRESSION: No acute renal abnormality.  No hydronephrosis. Electronically Signed   By: Rolm Baptise M.D.   On: 05/21/2018 22:20   Dg Chest Port 1 View  Result Date: 05/22/2018 CLINICAL DATA:  Respiratory failure EXAM: PORTABLE CHEST 1 VIEW COMPARISON:  1 day prior FINDINGS: Endotracheal tube is unchanged in position. Nasogastric tube extends beyond the inferior aspect of the film. Right internal jugular line unchanged with tip at mid to low SVC. Cardiomegaly accentuated by AP portable technique. No right-sided pleural effusion. Cannot exclude small left pleural effusion. No pneumothorax. Improved inspiratory effort and aeration. No overt congestive failure. Retrocardiac left lower lobe  airspace disease is felt to be slightly improved. IMPRESSION: Improved aeration, with resolved interstitial edema and decreased left base opacity. Suspect residual small left pleural effusion. Appropriate position of support apparatus. Electronically Signed   By: Abigail Miyamoto M.D.   On: 05/22/2018 14:59   Dg Chest Port 1 View  Result Date: 05/21/2018 CLINICAL DATA:  Central line placement. EXAM: PORTABLE CHEST 1 VIEW COMPARISON:  Earlier same day FINDINGS: 2119 hours. Endotracheal tube tip is 3 cm above the base of the carina. The NG tube passes into the stomach although the distal tip position is not included on the film. Right IJ central line tip overlies the proximal SVC. No evidence for pneumothorax or substantial pleural effusion. Lung volumes are low. The cardio pericardial silhouette is enlarged. Persistent retrocardiac left base collapse/consolidation. There is pulmonary vascular congestion without overt pulmonary edema. Telemetry leads overlie the chest. IMPRESSION: 1. Right IJ central line placement with tip overlying the proximal SVC level. No pneumothorax or substantial pleural effusion. 2. Cardiomegaly with vascular congestion and low lung volumes. 3. Similar appearance of the retrocardiac left base collapse/consolidation. Electronically Signed   By: Misty Stanley M.D.   On: 05/21/2018 21:45   Dg Chest Port 1 View  Result Date: 05/21/2018 CLINICAL DATA:  Ventilator dependent. EXAM: PORTABLE CHEST 1 VIEW COMPARISON:  05/21/2018 FINDINGS: Endotracheal tube is 3 cm above the carina. NG tube enters the stomach. Cardiomegaly with vascular congestion. Left lower lobe airspace opacity again noted, unchanged. No focal opacity on the right. No visible significant effusions or acute bony abnormality. IMPRESSION: Left lower lobe airspace opacity, stable. Mild cardiomegaly and vascular congestion. Electronically Signed   By: Rolm Baptise M.D.   On: 05/21/2018 20:38   Dg Chest Portable 1 View  Result  Date: 05/21/2018 CLINICAL DATA:  This post intubation EXAM: PORTABLE CHEST 1 VIEW COMPARISON:  05/13/2018 FINDINGS: Endotracheal to is in the right mainstem bronchus. Recommend retracting approximately 2-3 cm. Cardiomegaly. Low lung volumes. Increasing left perihilar and lower lobe airspace opacity which may reflect atelectasis from the right  mainstem intubation. Vascular congestion. No visible effusions or acute bony abnormality. IMPRESSION: Endotracheal tube just into the right mainstem bronchus. Recommend retracting 2-3 cm. Increasing left perihilar and lower lobe opacity could reflect associated atelectasis related to right mainstem intubation. Vascular congestion, low lung volumes. These results were called by telephone at the time of interpretation on 05/21/2018 at 4:08 pm to Dr. Nat Christen , who verbally acknowledged these results. Electronically Signed   By: Rolm Baptise M.D.   On: 05/21/2018 16:08   . arformoterol  15 mcg Nebulization BID  . budesonide (PULMICORT) nebulizer solution  0.5 mg Nebulization BID  . chlorhexidine gluconate (MEDLINE KIT)  15 mL Mouth Rinse BID  . famotidine  20 mg Per Tube Daily  . heparin  5,000 Units Subcutaneous Q8H  . insulin aspart  2-6 Units Subcutaneous Q4H  . ipratropium-albuterol  3 mL Nebulization Q6H  . mouth rinse  15 mL Mouth Rinse 10 times per day  . vancomycin variable dose per unstable renal function (pharmacist dosing)   Does not apply See admin instructions    BMET    Component Value Date/Time   NA 133 (L) 05/23/2018 0329   K 5.6 (H) 05/23/2018 0329   CL 101 05/23/2018 0329   CO2 24 05/23/2018 0329   GLUCOSE 139 (H) 05/23/2018 0329   BUN 59 (H) 05/23/2018 0329   CREATININE 4.24 (H) 05/23/2018 0329   CREATININE 1.50 (H) 02/16/2018 0741   CALCIUM 7.9 (L) 05/23/2018 0329   GFRNONAA 11 (L) 05/23/2018 0329   GFRNONAA 37 (L) 02/16/2018 0741   GFRAA 12 (L) 05/23/2018 0329   GFRAA 43 (L) 02/16/2018 0741   CBC    Component Value Date/Time    WBC 8.1 05/23/2018 0329   RBC 2.39 (L) 05/23/2018 0329   HGB 6.7 (LL) 05/23/2018 0329   HCT 22.6 (L) 05/23/2018 0329   PLT 252 05/23/2018 0329   MCV 94.6 05/23/2018 0329   MCH 28.0 05/23/2018 0329   MCHC 29.6 (L) 05/23/2018 0329   RDW 14.5 05/23/2018 0329   LYMPHSABS 1.4 05/21/2018 1515   MONOABS 0.6 05/21/2018 1515   EOSABS 0.1 05/21/2018 1515   BASOSABS 0.1 05/21/2018 1515     Assessment/Plan:  1. AKI/CKD stage 3 in setting of sepsis vs cardiorenal syndrome and concomitant ARB therapy.  Also with volume overload and low FeNa.  Started on CVVHD 05/21/18 due to volume and hyperkalemia.  To transition to IHD today.  Remains oliguric and dialysis dependent. 1. Temp HD cath placed 05/21/18 2. Acute on chronic dCHF- now on HD for UF 3. VDRF- hypoxic and hypercarbic.  Mass vs consolidation of LLL.  Per PCCM 4. Lung mass - in LLL present in 2014 and appears to have progressed.  Will need CT vs. PET scan for follow up. 5. Anemia of CKD- transfuse prn. 6. HTN- was on ARB/hydralazine/lasix pta.  On hold for now 7. AMS 8. DM type 2  Donetta Potts, MD [facility] [phone]

## 2018-05-24 DIAGNOSIS — Z8701 Personal history of pneumonia (recurrent): Secondary | ICD-10-CM

## 2018-05-24 DIAGNOSIS — I330 Acute and subacute infective endocarditis: Secondary | ICD-10-CM

## 2018-05-24 DIAGNOSIS — J9602 Acute respiratory failure with hypercapnia: Secondary | ICD-10-CM

## 2018-05-24 DIAGNOSIS — K0889 Other specified disorders of teeth and supporting structures: Secondary | ICD-10-CM

## 2018-05-24 DIAGNOSIS — Z87891 Personal history of nicotine dependence: Secondary | ICD-10-CM

## 2018-05-24 DIAGNOSIS — I10 Essential (primary) hypertension: Secondary | ICD-10-CM

## 2018-05-24 DIAGNOSIS — E872 Acidosis: Secondary | ICD-10-CM

## 2018-05-24 DIAGNOSIS — E119 Type 2 diabetes mellitus without complications: Secondary | ICD-10-CM

## 2018-05-24 DIAGNOSIS — R419 Unspecified symptoms and signs involving cognitive functions and awareness: Secondary | ICD-10-CM

## 2018-05-24 DIAGNOSIS — N170 Acute kidney failure with tubular necrosis: Secondary | ICD-10-CM

## 2018-05-24 DIAGNOSIS — R918 Other nonspecific abnormal finding of lung field: Secondary | ICD-10-CM

## 2018-05-24 LAB — RENAL FUNCTION PANEL
Albumin: 2.2 g/dL — ABNORMAL LOW (ref 3.5–5.0)
Anion gap: 6 (ref 5–15)
BUN: 31 mg/dL — ABNORMAL HIGH (ref 8–23)
CO2: 27 mmol/L (ref 22–32)
Calcium: 7.7 mg/dL — ABNORMAL LOW (ref 8.9–10.3)
Chloride: 102 mmol/L (ref 98–111)
Creatinine, Ser: 3.47 mg/dL — ABNORMAL HIGH (ref 0.44–1.00)
GFR calc Af Amer: 16 mL/min — ABNORMAL LOW (ref >60)
GFR calc non Af Amer: 13 mL/min — ABNORMAL LOW (ref >60)
Glucose, Bld: 174 mg/dL — ABNORMAL HIGH (ref 70–99)
Phosphorus: 5.4 mg/dL — ABNORMAL HIGH (ref 2.5–4.6)
Potassium: 4.6 mmol/L (ref 3.5–5.1)
Sodium: 135 mmol/L (ref 135–145)

## 2018-05-24 LAB — HEPATITIS B SURFACE ANTIGEN: Hepatitis B Surface Ag: NEGATIVE

## 2018-05-24 LAB — CBC
HCT: 25.9 % — ABNORMAL LOW (ref 36.0–46.0)
Hemoglobin: 7.7 g/dL — ABNORMAL LOW (ref 12.0–15.0)
MCH: 27.8 pg (ref 26.0–34.0)
MCHC: 29.7 g/dL — ABNORMAL LOW (ref 30.0–36.0)
MCV: 93.5 fL (ref 80.0–100.0)
Platelets: 218 10*3/uL (ref 150–400)
RBC: 2.77 MIL/uL — ABNORMAL LOW (ref 3.87–5.11)
RDW: 14.8 % (ref 11.5–15.5)
WBC: 7.5 10*3/uL (ref 4.0–10.5)
nRBC: 0 % (ref 0.0–0.2)

## 2018-05-24 LAB — TYPE AND SCREEN
ABO/RH(D): A POS
Antibody Screen: NEGATIVE
Unit division: 0

## 2018-05-24 LAB — HEPATITIS B SURFACE ANTIBODY,QUALITATIVE: Hep B S Ab: NONREACTIVE

## 2018-05-24 LAB — BPAM RBC
Blood Product Expiration Date: 202003182359
ISSUE DATE / TIME: 202002240644
Unit Type and Rh: 6200

## 2018-05-24 LAB — GLUCOSE, CAPILLARY
Glucose-Capillary: 131 mg/dL — ABNORMAL HIGH (ref 70–99)
Glucose-Capillary: 137 mg/dL — ABNORMAL HIGH (ref 70–99)
Glucose-Capillary: 140 mg/dL — ABNORMAL HIGH (ref 70–99)
Glucose-Capillary: 142 mg/dL — ABNORMAL HIGH (ref 70–99)
Glucose-Capillary: 149 mg/dL — ABNORMAL HIGH (ref 70–99)
Glucose-Capillary: 152 mg/dL — ABNORMAL HIGH (ref 70–99)

## 2018-05-24 LAB — HEPATITIS B CORE ANTIBODY, IGM: Hep B C IgM: NEGATIVE

## 2018-05-24 MED ORDER — LABETALOL HCL 5 MG/ML IV SOLN
5.0000 mg | INTRAVENOUS | Status: DC | PRN
  Administered 2018-05-28 – 2018-05-29 (×2): 5 mg via INTRAVENOUS
  Filled 2018-05-24 (×3): qty 4

## 2018-05-24 MED ORDER — FUROSEMIDE 10 MG/ML IJ SOLN
120.0000 mg | Freq: Two times a day (BID) | INTRAVENOUS | Status: DC
  Filled 2018-05-24: qty 12

## 2018-05-24 MED ORDER — VITAL AF 1.2 CAL PO LIQD
1000.0000 mL | ORAL | Status: DC
  Administered 2018-05-24 – 2018-05-26 (×3): 1000 mL
  Filled 2018-05-24 (×6): qty 1000

## 2018-05-24 MED ORDER — FUROSEMIDE 10 MG/ML IJ SOLN
120.0000 mg | Freq: Two times a day (BID) | INTRAVENOUS | Status: AC
  Administered 2018-05-24 – 2018-05-25 (×3): 120 mg via INTRAVENOUS
  Filled 2018-05-24: qty 10
  Filled 2018-05-24: qty 2
  Filled 2018-05-24: qty 10

## 2018-05-24 MED ORDER — HYDRALAZINE HCL 25 MG PO TABS
25.0000 mg | ORAL_TABLET | Freq: Three times a day (TID) | ORAL | Status: DC
  Administered 2018-05-24 – 2018-05-25 (×3): 25 mg via ORAL
  Filled 2018-05-24 (×3): qty 1

## 2018-05-24 MED ORDER — PRO-STAT SUGAR FREE PO LIQD
30.0000 mL | Freq: Two times a day (BID) | ORAL | Status: DC
  Administered 2018-05-24 – 2018-06-01 (×13): 30 mL
  Filled 2018-05-24 (×14): qty 30

## 2018-05-24 MED ORDER — DEXMEDETOMIDINE HCL IN NACL 400 MCG/100ML IV SOLN
0.4000 ug/kg/h | INTRAVENOUS | Status: DC
  Administered 2018-05-24 (×2): 0.8 ug/kg/h via INTRAVENOUS
  Administered 2018-05-24: 0.4 ug/kg/h via INTRAVENOUS
  Administered 2018-05-24: 0.8 ug/kg/h via INTRAVENOUS
  Administered 2018-05-25: 0.7 ug/kg/h via INTRAVENOUS
  Filled 2018-05-24 (×5): qty 100

## 2018-05-24 MED ORDER — DIPHENHYDRAMINE HCL 50 MG/ML IJ SOLN
12.5000 mg | Freq: Four times a day (QID) | INTRAMUSCULAR | Status: DC | PRN
  Administered 2018-05-24: 12.5 mg via INTRAVENOUS
  Filled 2018-05-24: qty 1

## 2018-05-24 NOTE — H&P (View-Only) (Signed)
[place] for Infectious Disease  Total days of antibiotics 4               Reason for Consult:native AV endocarditis    Referring Physician: icard  Active Problems:   Respiratory arrest ([place])   Acute renal failure (ARF) ([place])    HPI: [name] is a 63 y.o. female with hx of DM, HTN, cognitive delay, who was recently hospitalized for CAP from 2/17-2/20 and discharged back to SNF. She was readmitted on 2/22 for increased respiratory distress in the setting of having wide complex tachycardia, AKI, hyperkalemia and mild lactic acidosis requiring intubation. TTE found her to have new AV vegetation of 1.1 x 0.5cm. her infectious work up included blood cx which are NGTD at [place]. Repeat blood cx taken today. She remains intubated/sedated. On her previous admission, she did not have any positive cultures. She remains on vancomycin and amp/sub  Past Medical History:  Diagnosis Date  . Arthritis   . Asthma   . CHF (congestive heart failure) ([place])   . Diabetes mellitus without complication ([place])   . Hypertension   . Iron deficiency anemia 10/16/2010  . Mental handicap 10/16/2010    Allergies: No Known Allergies  MEDICATIONS: . arformoterol  15 mcg Nebulization BID  . budesonide (PULMICORT) nebulizer solution  0.5 mg Nebulization BID  . chlorhexidine gluconate (MEDLINE KIT)  15 mL Mouth Rinse BID  . famotidine  20 mg Per Tube Daily  . feeding supplement (PRO-STAT SUGAR FREE 64)  30 mL Per Tube BID  . heparin  5,000 Units Subcutaneous Q8H  . insulin aspart  2-6 Units Subcutaneous Q4H  . ipratropium-albuterol  3 mL Nebulization Q6H  . mouth rinse  15 mL Mouth Rinse 10 times per day    Social History   Tobacco Use  . Smoking status: Former Smoker    Packs/day: 1.00    Years: 0.00    Pack years: 0.00    Types: Cigarettes    Last attempt to quit: 04/11/1996    Years since quitting: 22.1  . Smokeless tobacco: Never Used  . Tobacco comment: quit a couple year ago    Substance Use Topics  . Alcohol use: No  . Drug use: No    Family History  Problem Relation Age of Onset  . Diabetes Mother   . Hypertension Mother   . Hypertension Father   . Kidney disease Other        son reportedly has had cyst on kidney and lung s/p surgery at [facility]  . Hypertension Son   . Colon cancer Neg Hx     ROS: unable to obtain due to being intubated/sedated   OBJECTIVE: Temp:  [98.8 F (37.1 C)-99.7 F (37.6 C)] 99.3 F (37.4 C) (02/25 1445) Pulse Rate:  [70-97] 70 (02/25 1445) Resp:  [15-25] 22 (02/25 1445) BP: (76-167)/(35-126) 167/77 (02/25 1445) SpO2:  [83 %-100 %] 97 % (02/25 1445) FiO2 (%):  [40 %] 40 % (02/25 1330) Weight:  [125.3 kg-128.5 kg] 125.3 kg (02/25 0500) Physical Exam  Constitutional:  Sedated and intubated. appears well-developed and well-nourished. No distress.  HENT: [place]/AT, OETT in place Mouth/Throat: poor dentition Cardiovascular: Normal rate, regular rhythm and normal heart sounds. Exam reveals no gallop and no friction rub.  No murmur heard.  Pulmonary/Chest: Effort normal and breath sounds normal. No respiratory distress.  has no wheezes.  Neck = supple, no nuchal rigidity Abdominal: Soft. Bowel sounds are normal.  exhibits no distension.  There is no tenderness.  Lymphadenopathy: no cervical adenopathy. No axillary adenopathy Neurological: sedated. Withdraws extremities to tactile stimuli Skin: Skin is warm and dry. No rash noted. No erythema.   LABS: Results for orders placed or performed during the hospital encounter of 05/21/18 (from the past 48 hour(s))  Glucose, capillary     Status: Abnormal   Collection Time: 05/22/18  4:02 PM  Result Value Ref Range   Glucose-Capillary 118 (H) 70 - 99 mg/dL  Glucose, capillary     Status: Abnormal   Collection Time: 05/22/18  7:57 PM  Result Value Ref Range   Glucose-Capillary 126 (H) 70 - 99 mg/dL  Glucose, capillary     Status: Abnormal   Collection Time: 05/22/18 11:28 PM   Result Value Ref Range   Glucose-Capillary 130 (H) 70 - 99 mg/dL  Procalcitonin     Status: None   Collection Time: 05/23/18  3:29 AM  Result Value Ref Range   Procalcitonin 3.87 ng/mL    Comment:        Interpretation: PCT > 2 ng/mL: Systemic infection (sepsis) is likely, unless other causes are known. (NOTE)       Sepsis PCT Algorithm           Lower Respiratory Tract                                      Infection PCT Algorithm    ----------------------------     ----------------------------         PCT < 0.25 ng/mL                PCT < 0.10 ng/mL         Strongly encourage             Strongly discourage   discontinuation of antibiotics    initiation of antibiotics    ----------------------------     -----------------------------       PCT 0.25 - 0.50 ng/mL            PCT 0.10 - 0.25 ng/mL               OR       >80% decrease in PCT            Discourage initiation of                                            antibiotics      Encourage discontinuation           of antibiotics    ----------------------------     -----------------------------         PCT >= 0.50 ng/mL              PCT 0.26 - 0.50 ng/mL               AND       <80% decrease in PCT              Encourage initiation of                                             antibiotics  Encourage continuation           of antibiotics    ----------------------------     -----------------------------        PCT >= 0.50 ng/mL                  PCT > 0.50 ng/mL               AND         increase in PCT                  Strongly encourage                                      initiation of antibiotics    Strongly encourage escalation           of antibiotics                                     -----------------------------                                           PCT <= 0.25 ng/mL                                                 OR                                        > 80% decrease in PCT                                      Discontinue / Do not initiate                                             antibiotics Performed at [facility], [address] [place], [place] [address]   Basic metabolic panel     Status: Abnormal   Collection Time: 05/23/18  3:29 AM  Result Value Ref Range   Sodium 133 (L) 135 - 145 mmol/L   Potassium 5.6 (H) 3.5 - 5.1 mmol/L   Chloride 101 98 - 111 mmol/L   CO2 24 22 - 32 mmol/L   Glucose, Bld 139 (H) 70 - 99 mg/dL   BUN 59 (H) 8 - 23 mg/dL   Creatinine, Ser 4.24 (H) 0.44 - 1.00 mg/dL    Comment: DELTA CHECK NOTED   Calcium 7.9 (L) 8.9 - 10.3 mg/dL   GFR calc non Af Amer 11 (L) >60 mL/min   GFR calc Af Amer 12 (L) >60 mL/min   Anion gap 8 5 - 15    Comment: Performed at [facility] [address], [place], [place] [address]  CBC     Status: Abnormal   Collection Time: 05/23/18  3:29 AM  Result Value Ref Range   WBC 8.1 4.0 - 10.5 K/uL   RBC 2.39 (L) 3.87 - 5.11 MIL/uL   Hemoglobin 6.7 (LL) 12.0 - 15.0 g/dL    Comment: REPEATED TO VERIFY THIS CRITICAL RESULT HAS VERIFIED AND BEEN CALLED TO M.WHITE,RN BY GEOFFREY MCADOO ON 02 24 2020 AT 0412, AND HAS BEEN READ BACK.     HCT 22.6 (L) 36.0 - 46.0 %   MCV 94.6 80.0 - 100.0 fL   MCH 28.0 26.0 - 34.0 pg   MCHC 29.6 (L) 30.0 - 36.0 g/dL   RDW 14.5 11.5 - 15.5 %   Platelets 252 150 - 400 K/uL   nRBC 0.0 0.0 - 0.2 %    Comment: Performed at [facility] [address], [place], [place] [address]  Magnesium     Status: None   Collection Time: 05/23/18  3:29 AM  Result Value Ref Range   Magnesium 2.4 1.7 - 2.4 mg/dL    Comment: Performed at [facility] [address], [place], [place] [address]  Glucose, capillary     Status: Abnormal   Collection Time: 05/23/18  3:32 AM  Result Value Ref Range   Glucose-Capillary 128 (H) 70 - 99 mg/dL  Prepare RBC     Status: None   Collection Time: 05/23/18  4:32 AM  Result Value Ref Range   Order Confirmation      ORDER PROCESSED BY BLOOD  BANK Performed at [facility], [place] [address], [place], [place] [address]   Type and screen [place]     Status: None   Collection Time: 05/23/18  5:00 AM  Result Value Ref Range   ABO/RH(D) A POS    Antibody Screen NEG    Sample Expiration 05/26/2018    Unit Number [id number]    Blood Component Type RED CELLS,LR    Unit division 00    Status of Unit ISSUED,FINAL    Transfusion Status OK TO TRANSFUSE    Crossmatch Result      Compatible Performed at [facility], [place] [address], [place], [place] [address]   ABO/Rh     Status: None   Collection Time: 05/23/18  5:00 AM  Result Value Ref Range   ABO/RH(D)      A POS Performed at [facility] [address] [place], [place] [address]   Glucose, capillary     Status: Abnormal   Collection Time: 05/23/18  7:50 AM  Result Value Ref Range   Glucose-Capillary 123 (H) 70 - 99 mg/dL  Glucose, capillary     Status: Abnormal   Collection Time: 05/23/18 11:30 AM  Result Value Ref Range   Glucose-Capillary 122 (H) 70 - 99 mg/dL  Glucose, capillary     Status: Abnormal   Collection Time: 05/23/18  3:29 PM  Result Value Ref Range   Glucose-Capillary 103 (H) 70 - 99 mg/dL  Hepatitis B surface antigen     Status: None   Collection Time: 05/23/18  5:16 PM  Result Value Ref Range   Hepatitis B Surface Ag Negative Negative    Comment: (NOTE) Performed At: [facility] [place], [place] [id number] Rush Farmer MD [id number]   Hepatitis B surface antibody,qualitative     Status: None   Collection Time: 05/23/18  5:16 PM  Result Value Ref Range   Hep B S Ab Non Reactive     Comment: (NOTE)  Non Reactive: Inconsistent with immunity,                            less than 10 mIU/mL              Reactive:     Consistent with immunity,                            greater than 9.9 mIU/mL Performed At: [facility] [place], [place]  [id number] Rush Farmer MD [id number]   Hepatitis B core antibody, IgM     Status: None   Collection Time: 05/23/18  5:16 PM  Result Value Ref Range   Hep B C IgM Negative Negative    Comment: (NOTE) Performed At: [facility] [address] [place], [place] [id number] Rush Farmer MD [id number]   Glucose, capillary     Status: Abnormal   Collection Time: 05/23/18  7:20 PM  Result Value Ref Range   Glucose-Capillary 102 (H) 70 - 99 mg/dL  Glucose, capillary     Status: Abnormal   Collection Time: 05/23/18 11:52 PM  Result Value Ref Range   Glucose-Capillary 131 (H) 70 - 99 mg/dL  Glucose, capillary     Status: Abnormal   Collection Time: 05/24/18  3:37 AM  Result Value Ref Range   Glucose-Capillary 142 (H) 70 - 99 mg/dL  Renal function panel     Status: Abnormal   Collection Time: 05/24/18  5:00 AM  Result Value Ref Range   Sodium 135 135 - 145 mmol/L   Potassium 4.6 3.5 - 5.1 mmol/L    Comment: NO VISIBLE HEMOLYSIS   Chloride 102 98 - 111 mmol/L   CO2 27 22 - 32 mmol/L   Glucose, Bld 174 (H) 70 - 99 mg/dL   BUN 31 (H) 8 - 23 mg/dL   Creatinine, Ser 3.47 (H) 0.44 - 1.00 mg/dL   Calcium 7.7 (L) 8.9 - 10.3 mg/dL   Phosphorus 5.4 (H) 2.5 - 4.6 mg/dL   Albumin 2.2 (L) 3.5 - 5.0 g/dL   GFR calc non Af Amer 13 (L) >60 mL/min   GFR calc Af Amer 16 (L) >60 mL/min   Anion gap 6 5 - 15    Comment: Performed at [facility], [place] [address] [place], [place] [address]  CBC     Status: Abnormal   Collection Time: 05/24/18  5:00 AM  Result Value Ref Range   WBC 7.5 4.0 - 10.5 K/uL   RBC 2.77 (L) 3.87 - 5.11 MIL/uL   Hemoglobin 7.7 (L) 12.0 - 15.0 g/dL   HCT 25.9 (L) 36.0 - 46.0 %   MCV 93.5 80.0 - 100.0 fL   MCH 27.8 26.0 - 34.0 pg   MCHC 29.7 (L) 30.0 - 36.0 g/dL   RDW 14.8 11.5 - 15.5 %   Platelets 218 150 - 400 K/uL   nRBC 0.0 0.0 - 0.2 %    Comment: Performed at [facility], [place] [address] [place], [place] [address]  Glucose, capillary      Status: Abnormal   Collection Time: 05/24/18  7:53 AM  Result Value Ref Range   Glucose-Capillary 152 (H) 70 - 99 mg/dL  Glucose, capillary     Status: Abnormal   Collection Time: 05/24/18 11:28 AM  Result Value Ref Range   Glucose-Capillary 137 (H) 70 - 99 mg/dL  MICRO: Follow up on blood cx IMAGING: No results found.  TTE:   1. The left ventricle has hyperdynamic systolic function, with an ejection fraction of >65%. The cavity size was normal. Left ventricular diastolic Doppler parameters are consistent with impaired relaxation.  2. The right ventricle has normal systolic function. The cavity was normal. There is no increase in right ventricular wall thickness.  3. The mitral valve is normal in structure.  4. The tricuspid valve is normal in structure.  5. The aortic valve is tricuspid Mild thickening of the aortic valve Mild calcification of the aortic valve.  6. There is a small mobile density (1.1 x 0.5cm) on the ventricular surface of the aortic valve. Challenging to visualize. Consider TEE if clinically warranted. Chest CT: There is masslike consolidation over the posterior left lower lobe measuring approximately 5 x 8.2 cm.   Assessment/Plan:  63yo F with concern for culture negative vs. [place] AV lesion, concerning for IE since she also recently had been hospitalized with pneumonia though no documented bacteremia  - recommend TEE - continue on vancomycin plus amp/sub as part of culture negative endocarditis - will plan on checking ur strep pneumonia, chlamydia species, bartonella ab. As part of culture negative work up.  Will watch for vancomycin toxicity given that she may need 6 wk of therapy   Pulmonary mass in LLL = concerning for malignancy per dr icard who at this time deferring bronchoscopy given that she came in with WCT/VT. Will address as she is more stabilized.

## 2018-05-24 NOTE — Progress Notes (Signed)
Nutrition Follow-up  DOCUMENTATION CODES:   Morbid obesity  INTERVENTION:   Change TF:  Vital AF 1.2 at 45 ml/h  Pro-stat 30 ml BID  Provides 1496 kcal, 111 gm protein, 876 ml free water, 1777 mg potassium daily  NUTRITION DIAGNOSIS:   Inadequate oral intake related to inability to eat as evidenced by NPO status.  ongoing  GOAL:   Provide needs based on ASPEN/SCCM guidelines  Met with TF  MONITOR:   Vent status, Labs, I & O's, TF tolerance  REASON FOR ASSESSMENT:   Consult(verbal) Enteral/tube feeding initiation and management(evaluate potassium content of TF formula)  ASSESSMENT:   63 yo female with PMH of HTN, DM, anemia, CHF, mental handicap, asthma, arthritis who was admitted from LTC to [facility] with respiratory distress, V Tach; intubated and transferred to [facility] 2/22.  Patient remains intubated on ventilator support Temp (24hrs), Avg:99.2 F (37.3 C), Min:98.8 F (37.1 C), Max:99.7 F (37.6 C)   Labs and medications reviewed.  Potassium has decreased from 5.6 (H) yesterday to 4.6 WNL today.  CBG's: 142-152 Remains on neosynephrine; also receiving lasix.   Current TF is providing 2.3 gm K, which is less than the recommended potassium intake for a HD patient (<2.4 gm/d). Will change TF to provide less volume and less potassium. Discussed plans with Dr. Marval Regal.    Diet Order:   Diet Order    None      EDUCATION NEEDS:   No education needs have been identified at this time  Skin:  Skin Assessment: Reviewed RN Assessment  Last BM:  2/22  Height:   Ht Readings from Last 1 Encounters:  05/21/18 '5\' 4"'$  (1.626 m)    Weight:   Wt Readings from Last 1 Encounters:  05/24/18 125.3 kg    Ideal Body Weight:  54.5 kg  BMI:  Body mass index is 47.42 kg/m.  Estimated Nutritional Needs:   Kcal:  1390-1770  Protein:  110-135 gm  Fluid:  >/= 1.7 L    Molli Barrows, RD, LDN, CNSC Pager [phone] After Hours Pager [phone]

## 2018-05-24 NOTE — Progress Notes (Signed)
Patient ID: [name], female   DOB: Jan 07, 1956, 63 y.o.   MRN: [id number] S: pt tolerated HD well but continues to have issues with hyperkalemia O:BP (!) 97/52   Pulse 81   Temp 99.5 F (37.5 C)   Resp (!) 25   Ht _0  (1.626 m)   Wt 125.3 kg   SpO2 98%   BMI 47.42 kg/m   Intake/Output Summary (Last 24 hours) at 05/24/2018 1039 Last data filed at 05/24/2018 1000 Gross per 24 hour  Intake 1204.6 ml  Output 3251 ml  Net -2046.4 ml   Intake/Output: I/O last 3 completed shifts: In: 2612.8 [I.V.:897.7; Blood:416; NG/GT:602; IV PCHEKBTCY:818] Out: 5909 [Urine:92; Emesis/NG output:200; Other:3000]  Intake/Output this shift:  Total I/O In: 39.5 [I.V.:39.5] Out: -  Weight change: 2 kg Gen: intubated but awake CVS: no rub Resp: cta Abd:+BS, soft, NT/ND Ext: trace edema  Recent Labs  Lab 05/21/18 1515  05/21/18 2000 05/21/18 2019 05/21/18 2326 05/22/18 0441 05/22/18 1156 05/22/18 1217 05/23/18 0329  NA 126*   < > 127* 129* 131* 133* 133* 135 133*  K >7.5*   < > 7.5* 7.0* 6.4* 5.6* 4.8 4.8 5.6*  CL 100  --  101  --  101 100 101  --  101  CO2 15*  --  19*  --  21* 22 23  --  24  GLUCOSE 322*  --  391*  --  215* 169* 119*  --  139*  BUN 91*  --  89*  --  80* 64* 49*  --  59*  CREATININE 4.31*  --  4.65*  --  4.08* 3.42* 2.91*  --  4.24*  ALBUMIN 2.9*  --  2.5*  --   --  2.3*  --   --   --   CALCIUM 8.4*  --  8.1*  --  7.9* 7.7* 8.0*  --  7.9*  PHOS  --   --   --   --   --  4.7*  --   --   --   AST 30  --  93*  --   --   --   --   --   --   ALT 20  --  38  --   --   --   --   --   --    < > = values in this interval not displayed.   Liver Function Tests: Recent Labs  Lab 05/21/18 1515 05/21/18 2000 05/22/18 0441  AST 30 93*  --   ALT 20 38  --   ALKPHOS 54 56  --   BILITOT 0.3 0.6  --   PROT 7.4 6.7  --   ALBUMIN 2.9* 2.5* 2.3*   No results for input(s): LIPASE, AMYLASE in the last 168 hours. No results for input(s): AMMONIA in the last 168  hours. CBC: Recent Labs  Lab 05/21/18 1515  05/22/18 0441 05/22/18 1217 05/23/18 0329 05/24/18 0500  WBC 11.8*  --  8.4  --  8.1 7.5  NEUTROABS 9.3*  --   --   --   --   --   HGB 9.4*   < > 7.4* 8.2* 6.7* 7.7*  HCT 32.4*   < > 24.2* 24.0* 22.6* 25.9*  MCV 95.9  --  93.1  --  94.6 93.5  PLT 572*  --  321  --  252 218   < > = values in this interval not displayed.  Cardiac Enzymes: Recent Labs  Lab 05/21/18 1515 05/22/18 1156  CKTOTAL  --  129  TROPONINI <0.03  --    CBG: Recent Labs  Lab 05/23/18 1529 05/23/18 1920 05/23/18 2352 05/24/18 0337 05/24/18 0753  GLUCAP 103* 102* 131* 142* 152*    Iron Studies: No results for input(s): IRON, TIBC, TRANSFERRIN, FERRITIN in the last 72 hours. Studies/Results: Dg Chest Port 1 View  Result Date: 05/22/2018 CLINICAL DATA:  Respiratory failure EXAM: PORTABLE CHEST 1 VIEW COMPARISON:  1 day prior FINDINGS: Endotracheal tube is unchanged in position. Nasogastric tube extends beyond the inferior aspect of the film. Right internal jugular line unchanged with tip at mid to low SVC. Cardiomegaly accentuated by AP portable technique. No right-sided pleural effusion. Cannot exclude small left pleural effusion. No pneumothorax. Improved inspiratory effort and aeration. No overt congestive failure. Retrocardiac left lower lobe airspace disease is felt to be slightly improved. IMPRESSION: Improved aeration, with resolved interstitial edema and decreased left base opacity. Suspect residual small left pleural effusion. Appropriate position of support apparatus. Electronically Signed   By: Abigail Miyamoto M.D.   On: 05/22/2018 14:59   . arformoterol  15 mcg Nebulization BID  . budesonide (PULMICORT) nebulizer solution  0.5 mg Nebulization BID  . chlorhexidine gluconate (MEDLINE KIT)  15 mL Mouth Rinse BID  . famotidine  20 mg Per Tube Daily  . feeding supplement (VITAL HIGH PROTEIN)  1,000 mL Per Tube Q24H  . heparin  5,000 Units Subcutaneous Q8H   . insulin aspart  2-6 Units Subcutaneous Q4H  . ipratropium-albuterol  3 mL Nebulization Q6H  . mouth rinse  15 mL Mouth Rinse 10 times per day    BMET    Component Value Date/Time   NA 133 (L) 05/23/2018 0329   K 5.6 (H) 05/23/2018 0329   CL 101 05/23/2018 0329   CO2 24 05/23/2018 0329   GLUCOSE 139 (H) 05/23/2018 0329   BUN 59 (H) 05/23/2018 0329   CREATININE 4.24 (H) 05/23/2018 0329   CREATININE 1.50 (H) 02/16/2018 0741   CALCIUM 7.9 (L) 05/23/2018 0329   GFRNONAA 11 (L) 05/23/2018 0329   GFRNONAA 37 (L) 02/16/2018 0741   GFRAA 12 (L) 05/23/2018 0329   GFRAA 43 (L) 02/16/2018 0741   CBC    Component Value Date/Time   WBC 7.5 05/24/2018 0500   RBC 2.77 (L) 05/24/2018 0500   HGB 7.7 (L) 05/24/2018 0500   HCT 25.9 (L) 05/24/2018 0500   PLT 218 05/24/2018 0500   MCV 93.5 05/24/2018 0500   MCH 27.8 05/24/2018 0500   MCHC 29.7 (L) 05/24/2018 0500   RDW 14.8 05/24/2018 0500   LYMPHSABS 1.4 05/21/2018 1515   MONOABS 0.6 05/21/2018 1515   EOSABS 0.1 05/21/2018 1515   BASOSABS 0.1 05/21/2018 1515     Assessment/Plan:  1. AKI/CKD stage 3 in setting of sepsis vs cardiorenal syndrome and concomitant ARB therapy.  Also with volume overload and low FeNa.  Started on CVVHD 05/21/18 due to volume and hyperkalemia.  To transition to IHD today.  Remains oliguric and dialysis dependent. 1. Temp HD cath placed 05/21/18 2. Tolerated IHD well yesterday.  Plan for HD again tomorrow. 2. Hyperkalemia- due to #1 and protein supplements.  Will discuss with RD to change to low potassium supplements.  Plan for HD again tomorrow.  Will also add furosemide IV to see if this will help her K and UOP.  3. Acute on chronic dCHF- now on HD for UF 4. VDRF- hypoxic  and hypercarbic.  Mass vs consolidation of LLL.  Per PCCM 5. Lung mass - in LLL present in 2014 and appears to have progressed.  Will need CT vs. PET scan for follow up. 6. Anemia of CKD- transfuse prn. 7. HTN- was on ARB/hydralazine/lasix  pta.  On hold for now 8. AMS 9. DM type 2  Donetta Potts, MD [facility] [phone]

## 2018-05-24 NOTE — Progress Notes (Signed)
[facility] Progress Note Patient Name: [name] DOB: 1956/03/23 MRN: [id number]   Date of Service  05/24/2018  HPI/Events of Note  Itching   eICU Interventions  Will order: 1. Benadryl 12.5 mg IV Q 6 hours PRN itching.      Intervention Category Major Interventions: Other:  [name] 05/24/2018, 2:14 AM

## 2018-05-24 NOTE — Progress Notes (Addendum)
NAME:  [name], MRN:  [id number], DOB:  July 23, 1955, LOS: 3 ADMISSION DATE:  05/21/2018, CONSULTATION DATE:  05/21/2018 REFERRING MD:  Dr. Lacinda Axon, CHIEF COMPLAINT:  Respiratory Distress   History of present illness   63 year old female presents from [place] to [facility] with reported one day of respiratory distress, placed on BiPAP by EMS. On arrival to ED found to be in [place]. Given Amiodarone bolus Cardiology consulted. Preparing for cardioversion, however patient converted into wide complex tachycardia. Intubated, during intubation had notable emesis. Given Vancomycin/Zosyn. K 7.5, Crt 4.31. Administered Bicarb, Calcium, Albuterol, Insulin/Dextrose. ABG 7.15/45. LA 3.2. Transferred to [facility] for further treatment.   Recent admission 2/14-2/17 with CAP. Noted to have AKI which improved. At discharge Cr 1.63. CT Chest concerning for neoplastic process of left lower lung base.  Was treated with a course of Levaquin  Past Medical History  Obesity, HTN, Mental Health Handicap, grade 1 diastolic heart failure, Asthma Underlying obstructive lung disease  Significant Hospital Events   2/22 > Presents to ED   Consults:  PCCM 2/22 ID 2/25 - AoV Veg   Procedures:  ETT 2/22 >>   Significant Diagnostic Tests:  CT Chest 2/14 > Masslike consolidation measuring 5 x 8.2 cm over the left lower lobe  CXR 2/22 > Endotracheal tube just into the right mainstem bronchus. Recommend retracting 2-3 cm. Increasing left perihilar and lower lobe opacity could reflect associated atelectasis related to right mainstem intubation.  ECHO 2/23:   1. The left ventricle has hyperdynamic systolic function, with an ejection fraction of >65%. The cavity size was normal. Left ventricular diastolic Doppler parameters are consistent with impaired relaxation.  2. The right ventricle has normal systolic function. The cavity was normal. There is no increase in right ventricular wall thickness.  3. The mitral valve is  normal in structure.  4. The tricuspid valve is normal in structure.  5. The aortic valve is tricuspid Mild thickening of the aortic valve Mild calcification of the aortic valve.  6. There is a small mobile density (1.1 x 0.5cm) on the ventricular surface of the aortic valve. Challenging to visualize. Consider TEE if clinically warranted.  7. The pulmonic valve was normal in structure.  Micro Data:  Blood 2/22 >> Tracheal Asp 2/22 >> U/A 2/22 > Negative   Antimicrobials:  Cefepime 2/22 >> 224 stop Vancomycin 2/22 >> 2-24 stop Unasyn 224 2020 x 5 days then stop  Interim history/subjective:  05/24/2018: Doing well overnight.  Unable to tolerate SBT this morning as she did not draw spontaneous breaths.  Overall working at decreasing sedation.  As they do this she becomes a little bit more fidgety pulling at things and concern for self extubation therefore we will make some adjustments this morning.  Talking with the patient otherwise doing well.  Has no complaints.  Except for her ear itching and she keeps trying to reach for her ear to scratch it.  Objective   Blood pressure (!) 101/54, pulse 81, temperature 99.1 F (37.3 C), resp. rate 15, height 5\' 4"  (1.626 m), weight 125.3 kg, SpO2 99 %. CVP:  [3 mmHg-15 mmHg] 15 mmHg  Vent Mode: PRVC FiO2 (%):  [40 %] 40 % Set Rate:  [22 bmp] 22 bmp Vt Set:  [430 mL] 430 mL PEEP:  [5 cmH20] 5 cmH20 Plateau Pressure:  [20 cmH20-22 cmH20] 20 cmH20   Intake/Output Summary (Last 24 hours) at 05/24/2018 1138 Last data filed at 05/24/2018 1000 Gross per 24  hour  Intake 1155.71 ml  Output 3196 ml  Net -2040.29 ml   Filed Weights   05/23/18 1725 05/23/18 2115 05/24/18 0500  Weight: 128.5 kg 125.5 kg 125.3 kg    Examination: General: Female, intubated, lightly sedated on mechanical ventilation HENT: NCAT, sclera clear, tracking appropriately following all commands Neck: Endotracheal tube in place Lungs: No crackles, no wheeze, bilateral  ventilated breath sounds Cardiovascular: Regular rate and rhythm, S1-S2, no MRG Abdomen: Soft, nontender, nondistended Extremities: Some dependent edema in the lateral flanks and thighs Neuro: Alert, following commands, no focal deficit GU: Foley in place  Resolved Hospital Problem list     Assessment & Plan:   Acute hypoxic/hypercarbic respiratory failure Left lower lobe consolidation versus mass lesion-recent concern for neoplastic process -Prior CT imaging with cut off of the left lower lobe proximal bronchus with evidence of hilar adenopathy concerning for potential neoplasm History of COPD -Patient is currently on mechanical ventilatory support, we attempted SBT SAT today however was not drawing spontaneous breaths.  Therefore failed. -We will decrease sedation from fentanyl continuous drip. -Transition to Precedex -We will need to discuss risk benefits and alternatives of proceeding with bronchoscopic evaluation of the lower lobe lesion. -She does have what appears to be potential mediastinal adenopathy therefore the best procedure may be to wait until she is clinically stable and off of the ventilator and consideration for procedure in the OR for better capabilities for bronchoscopy to include endobronchial ultrasound and staging of the mediastinum. We will discuss this with the patient's guardian.   1.1 x 0.5 cm mobile density of the ventricular surface of the aortic valve, Cardiac vegetation -Blood cultures no growth to date - We will repeat blood cultures again today. -Transesophageal echocardiogram pending  Junctional Rhythm with prolonged QTC in setting of hyperkalemia  Episode of V tach Decompensated Heart Failure   Grade 1 Diastolic Heart Failure H/O HTN Plan   -We will continue on cardiac telemetry -Weaned off of vasopressors today. -Echocardiogram with normal ejection fraction there is a small 1.1 x 0.5 cm mobile density on the ventricular surface of the aortic  valve. -Will order transesophageal echocardiogram for further evaluation. -This will need to be completed while she is intubated to make this simpler.   Acute on Chronic Kidney Disease with Hyperkalemia  Metabolic acidosis Lactic Acidosis   Plan  -Initially temporized with bicarb, insulin and calcium -Renal ultrasound no hydronephrosis, no mass, adrenal adenoma 3.6 x 3.6 x 2.3 - Renal replacement needs per nephrology, we appreciate the recommendations  Hyperglycemia  DM Plan  -SSI  Leukocytosis Recent CAP Treatment  Question of endocarditis with aortic valve vegetation present Plan - Antimicrobials de-escalated to Unasyn - We will consult infectious disease pending transesophageal echocardiogram results.  Abnormal CT scan of the chest showing left lower lobe masslike lesion with airway compromise An underlying process was present from 2014, linear atelectasis with preservation of the airway in 2014 May be progressive process -At some point patient will benefit from bronchoscopic evaluation -We will need to arrange this. -We will discussed risk benefits and alternatives with patient's guardian. -She is high risk due to her recent cardiac irritability and V. tach, bronchoscopy is significantly vaguely stimulating.  And can predispose people to tacky arrhythmias.  Chronic Anemia  Plan Follow CBC -Continue DVT prophylaxis  Sedation Needs  Plan  - Decreased continuous fentanyl, placed on continuous Precedex -Attempt to wean off fentanyl  History of obstructive sleep apnea from a sleep study from 09/17/2017 Extubated will  restart home BiPAP settings 18/12.    Best practice:  Diet: NPO Pain/Anxiety/Delirium protocol  VAP protocol  DVT prophylaxis: Heparin SQ GI prophylaxis: Pepcid  Glucose control: SSI  Mobility: Bedrest  Code Status: Full Code  Family Communication: [place] of [place]  Disposition: Continue ICU care   Labs   CBC: Recent Labs  Lab 05/21/18 1515   05/21/18 2019 05/22/18 0441 05/22/18 1217 05/23/18 0329 05/24/18 0500  WBC 11.8*  --   --  8.4  --  8.1 7.5  NEUTROABS 9.3*  --   --   --   --   --   --   HGB 9.4*   < > 8.2* 7.4* 8.2* 6.7* 7.7*  HCT 32.4*   < > 24.0* 24.2* 24.0* 22.6* 25.9*  MCV 95.9  --   --  93.1  --  94.6 93.5  PLT 572*  --   --  321  --  252 218   < > = values in this interval not displayed.    Basic Metabolic Panel: Recent Labs  Lab 05/21/18 2326 05/22/18 0441 05/22/18 1156 05/22/18 1217 05/23/18 0329 05/24/18 0500  NA 131* 133* 133* 135 133* 135  K 6.4* 5.6* 4.8 4.8 5.6* 4.6  CL 101 100 101  --  101 102  CO2 21* 22 23  --  24 27  GLUCOSE 215* 169* 119*  --  139* 174*  BUN 80* 64* 49*  --  59* 31*  CREATININE 4.08* 3.42* 2.91*  --  4.24* 3.47*  CALCIUM 7.9* 7.7* 8.0*  --  7.9* 7.7*  MG  --  2.3  --   --  2.4  --   PHOS  --  4.7*  --   --   --  5.4*   GFR: Estimated Creatinine Clearance: 22 mL/min (A) (by C-G formula based on SCr of 3.47 mg/dL (H)). Recent Labs  Lab 05/21/18 1515 05/21/18 1544 05/21/18 1840 05/21/18 2000 05/22/18 0441 05/23/18 0329 05/24/18 0500  PROCALCITON  --   --   --  0.56 2.07 3.87  --   WBC 11.8*  --   --   --  8.4 8.1 7.5  LATICACIDVEN  --  3.2* 1.7  --   --   --   --     Liver Function Tests: Recent Labs  Lab 05/21/18 1515 05/21/18 2000 05/22/18 0441 05/24/18 0500  AST 30 93*  --   --   ALT 20 38  --   --   ALKPHOS 54 56  --   --   BILITOT 0.3 0.6  --   --   PROT 7.4 6.7  --   --   ALBUMIN 2.9* 2.5* 2.3* 2.2*   No results for input(s): LIPASE, AMYLASE in the last 168 hours. No results for input(s): AMMONIA in the last 168 hours.  ABG    Component Value Date/Time   PHART 7.337 (L) 05/22/2018 1217   PCO2ART 47.6 05/22/2018 1217   PO2ART 98.0 05/22/2018 1217   HCO3 25.6 05/22/2018 1217   TCO2 27 05/22/2018 1217   ACIDBASEDEF 1.0 05/22/2018 1217   O2SAT 97.0 05/22/2018 1217     Coagulation Profile: No results for input(s): INR, PROTIME in the  last 168 hours.  Cardiac Enzymes: Recent Labs  Lab 05/21/18 1515 05/22/18 1156  CKTOTAL  --  129  TROPONINI <0.03  --     CBG: Recent Labs  Lab 05/23/18 1920 05/23/18 2352 05/24/18 0337 05/24/18 0753 05/24/18 1128  GLUCAP 102* 131* 142* 152* 137*    This patient is critically ill with multiple organ system failure; which, requires frequent high complexity decision making, assessment, support, evaluation, and titration of therapies. This was completed through the application of advanced monitoring technologies and extensive interpretation of multiple databases. During this encounter critical care time was devoted to patient care services described in this note for 39 minutes.   Garner Nash, DO [facility] 05/24/2018 11:49 AM  Personal pager: [phone] If unanswered, please page CCM On-call: [phone]

## 2018-05-24 NOTE — Progress Notes (Signed)
MD paged for rising Bps (s/p discontinued Fentanyl gtt). Will continue to monitor.  Donnetta Simpers, RN

## 2018-05-24 NOTE — Consult Note (Signed)
[facility] for Infectious Disease  Total days of antibiotics 4               Reason for Consult:native AV endocarditis    Referring Physician: icard  Active Problems:   Respiratory arrest ([place])   Acute renal failure (ARF) ([place])    HPI: [name] is a 63 y.o. female with hx of DM, HTN, cognitive delay, who was recently hospitalized for CAP from 2/17-2/20 and discharged back to SNF. She was readmitted on 2/22 for increased respiratory distress in the setting of having wide complex tachycardia, AKI, hyperkalemia and mild lactic acidosis requiring intubation. TTE found her to have new AV vegetation of 1.1 x 0.5cm. her infectious work up included blood cx which are NGTD at [facility]. Repeat blood cx taken today. She remains intubated/sedated. On her previous admission, she did not have any positive cultures. She remains on vancomycin and amp/sub  Past Medical History:  Diagnosis Date  . Arthritis   . Asthma   . CHF (congestive heart failure) ([place])   . Diabetes mellitus without complication ([place])   . Hypertension   . Iron deficiency anemia 10/16/2010  . Mental handicap 10/16/2010    Allergies: No Known Allergies  MEDICATIONS: . arformoterol  15 mcg Nebulization BID  . budesonide (PULMICORT) nebulizer solution  0.5 mg Nebulization BID  . chlorhexidine gluconate (MEDLINE KIT)  15 mL Mouth Rinse BID  . famotidine  20 mg Per Tube Daily  . feeding supplement (PRO-STAT SUGAR FREE 64)  30 mL Per Tube BID  . heparin  5,000 Units Subcutaneous Q8H  . insulin aspart  2-6 Units Subcutaneous Q4H  . ipratropium-albuterol  3 mL Nebulization Q6H  . mouth rinse  15 mL Mouth Rinse 10 times per day    Social History   Tobacco Use  . Smoking status: Former Smoker    Packs/day: 1.00    Years: 0.00    Pack years: 0.00    Types: Cigarettes    Last attempt to quit: 04/11/1996    Years since quitting: 22.1  . Smokeless tobacco: Never Used  . Tobacco comment: quit a couple year ago    Substance Use Topics  . Alcohol use: No  . Drug use: No    Family History  Problem Relation Age of Onset  . Diabetes Mother   . Hypertension Mother   . Hypertension Father   . Kidney disease Other        son reportedly has had cyst on kidney and lung s/p surgery at [facility]  . Hypertension Son   . Colon cancer Neg Hx     ROS: unable to obtain due to being intubated/sedated   OBJECTIVE: Temp:  [98.8 F (37.1 C)-99.7 F (37.6 C)] 99.3 F (37.4 C) (02/25 1445) Pulse Rate:  [70-97] 70 (02/25 1445) Resp:  [15-25] 22 (02/25 1445) BP: (76-167)/(35-126) 167/77 (02/25 1445) SpO2:  [83 %-100 %] 97 % (02/25 1445) FiO2 (%):  [40 %] 40 % (02/25 1330) Weight:  [125.3 kg-128.5 kg] 125.3 kg (02/25 0500) Physical Exam  Constitutional:  Sedated and intubated. appears well-developed and well-nourished. No distress.  HENT: [place]/AT, OETT in place Mouth/Throat: poor dentition Cardiovascular: Normal rate, regular rhythm and normal heart sounds. Exam reveals no gallop and no friction rub.  No murmur heard.  Pulmonary/Chest: Effort normal and breath sounds normal. No respiratory distress.  has no wheezes.  Neck = supple, no nuchal rigidity Abdominal: Soft. Bowel sounds are normal.  exhibits no distension.  There is no tenderness.  Lymphadenopathy: no cervical adenopathy. No axillary adenopathy Neurological: sedated. Withdraws extremities to tactile stimuli Skin: Skin is warm and dry. No rash noted. No erythema.   LABS: Results for orders placed or performed during the hospital encounter of 05/21/18 (from the past 48 hour(s))  Glucose, capillary     Status: Abnormal   Collection Time: 05/22/18  4:02 PM  Result Value Ref Range   Glucose-Capillary 118 (H) 70 - 99 mg/dL  Glucose, capillary     Status: Abnormal   Collection Time: 05/22/18  7:57 PM  Result Value Ref Range   Glucose-Capillary 126 (H) 70 - 99 mg/dL  Glucose, capillary     Status: Abnormal   Collection Time: 05/22/18 11:28 PM   Result Value Ref Range   Glucose-Capillary 130 (H) 70 - 99 mg/dL  Procalcitonin     Status: None   Collection Time: 05/23/18  3:29 AM  Result Value Ref Range   Procalcitonin 3.87 ng/mL    Comment:        Interpretation: PCT > 2 ng/mL: Systemic infection (sepsis) is likely, unless other causes are known. (NOTE)       Sepsis PCT Algorithm           Lower Respiratory Tract                                      Infection PCT Algorithm    ----------------------------     ----------------------------         PCT < 0.25 ng/mL                PCT < 0.10 ng/mL         Strongly encourage             Strongly discourage   discontinuation of antibiotics    initiation of antibiotics    ----------------------------     -----------------------------       PCT 0.25 - 0.50 ng/mL            PCT 0.10 - 0.25 ng/mL               OR       >80% decrease in PCT            Discourage initiation of                                            antibiotics      Encourage discontinuation           of antibiotics    ----------------------------     -----------------------------         PCT >= 0.50 ng/mL              PCT 0.26 - 0.50 ng/mL               AND       <80% decrease in PCT              Encourage initiation of                                             antibiotics  Encourage continuation           of antibiotics    ----------------------------     -----------------------------        PCT >= 0.50 ng/mL                  PCT > 0.50 ng/mL               AND         increase in PCT                  Strongly encourage                                      initiation of antibiotics    Strongly encourage escalation           of antibiotics                                     -----------------------------                                           PCT <= 0.25 ng/mL                                                 OR                                        > 80% decrease in PCT                                      Discontinue / Do not initiate                                             antibiotics Performed at [facility], [address] [place], [place] [address]   Basic metabolic panel     Status: Abnormal   Collection Time: 05/23/18  3:29 AM  Result Value Ref Range   Sodium 133 (L) 135 - 145 mmol/L   Potassium 5.6 (H) 3.5 - 5.1 mmol/L   Chloride 101 98 - 111 mmol/L   CO2 24 22 - 32 mmol/L   Glucose, Bld 139 (H) 70 - 99 mg/dL   BUN 59 (H) 8 - 23 mg/dL   Creatinine, Ser 4.24 (H) 0.44 - 1.00 mg/dL    Comment: DELTA CHECK NOTED   Calcium 7.9 (L) 8.9 - 10.3 mg/dL   GFR calc non Af Amer 11 (L) >60 mL/min   GFR calc Af Amer 12 (L) >60 mL/min   Anion gap 8 5 - 15    Comment: Performed at [facility] [address] [place], [place] [address]  CBC     Status: Abnormal   Collection Time: 05/23/18  3:29 AM  Result Value Ref Range   WBC 8.1 4.0 - 10.5 K/uL   RBC 2.39 (L) 3.87 - 5.11 MIL/uL   Hemoglobin 6.7 (LL) 12.0 - 15.0 g/dL    Comment: REPEATED TO VERIFY THIS CRITICAL RESULT HAS VERIFIED AND BEEN CALLED TO M.WHITE,RN BY GEOFFREY MCADOO ON 02 24 2020 AT 0412, AND HAS BEEN READ BACK.     HCT 22.6 (L) 36.0 - 46.0 %   MCV 94.6 80.0 - 100.0 fL   MCH 28.0 26.0 - 34.0 pg   MCHC 29.6 (L) 30.0 - 36.0 g/dL   RDW 14.5 11.5 - 15.5 %   Platelets 252 150 - 400 K/uL   nRBC 0.0 0.0 - 0.2 %    Comment: Performed at [facility] [address], [place], [place] [address]  Magnesium     Status: None   Collection Time: 05/23/18  3:29 AM  Result Value Ref Range   Magnesium 2.4 1.7 - 2.4 mg/dL    Comment: Performed at [facility] [address], [place], [place] [address]  Glucose, capillary     Status: Abnormal   Collection Time: 05/23/18  3:32 AM  Result Value Ref Range   Glucose-Capillary 128 (H) 70 - 99 mg/dL  Prepare RBC     Status: None   Collection Time: 05/23/18  4:32 AM  Result Value Ref Range   Order Confirmation      ORDER PROCESSED BY BLOOD  BANK Performed at [facility], [place] [address], [place], [place] [address]   Type and screen [place]     Status: None   Collection Time: 05/23/18  5:00 AM  Result Value Ref Range   ABO/RH(D) A POS    Antibody Screen NEG    Sample Expiration 05/26/2018    Unit Number [id number]    Blood Component Type RED CELLS,LR    Unit division 00    Status of Unit ISSUED,FINAL    Transfusion Status OK TO TRANSFUSE    Crossmatch Result      Compatible Performed at [facility], [place] [address], [place], [place] [address]   ABO/Rh     Status: None   Collection Time: 05/23/18  5:00 AM  Result Value Ref Range   ABO/RH(D)      A POS Performed at [facility] [address] [place], [place] [address]   Glucose, capillary     Status: Abnormal   Collection Time: 05/23/18  7:50 AM  Result Value Ref Range   Glucose-Capillary 123 (H) 70 - 99 mg/dL  Glucose, capillary     Status: Abnormal   Collection Time: 05/23/18 11:30 AM  Result Value Ref Range   Glucose-Capillary 122 (H) 70 - 99 mg/dL  Glucose, capillary     Status: Abnormal   Collection Time: 05/23/18  3:29 PM  Result Value Ref Range   Glucose-Capillary 103 (H) 70 - 99 mg/dL  Hepatitis B surface antigen     Status: None   Collection Time: 05/23/18  5:16 PM  Result Value Ref Range   Hepatitis B Surface Ag Negative Negative    Comment: (NOTE) Performed At: [facility] [place], [place] [id number] Rush Farmer MD FY:[id number]   Hepatitis B surface antibody,qualitative     Status: None   Collection Time: 05/23/18  5:16 PM  Result Value Ref Range   Hep B S Ab Non Reactive     Comment: (NOTE)  Non Reactive: Inconsistent with immunity,                            less than 10 mIU/mL              Reactive:     Consistent with immunity,                            greater than 9.9 mIU/mL Performed At: [facility] [place], [place]  [id number] Rush Farmer MD TD:[id number]   Hepatitis B core antibody, IgM     Status: None   Collection Time: 05/23/18  5:16 PM  Result Value Ref Range   Hep B C IgM Negative Negative    Comment: (NOTE) Performed At: [facility] [address] [place], [place] [id number] Rush Farmer MD CB:[id number]   Glucose, capillary     Status: Abnormal   Collection Time: 05/23/18  7:20 PM  Result Value Ref Range   Glucose-Capillary 102 (H) 70 - 99 mg/dL  Glucose, capillary     Status: Abnormal   Collection Time: 05/23/18 11:52 PM  Result Value Ref Range   Glucose-Capillary 131 (H) 70 - 99 mg/dL  Glucose, capillary     Status: Abnormal   Collection Time: 05/24/18  3:37 AM  Result Value Ref Range   Glucose-Capillary 142 (H) 70 - 99 mg/dL  Renal function panel     Status: Abnormal   Collection Time: 05/24/18  5:00 AM  Result Value Ref Range   Sodium 135 135 - 145 mmol/L   Potassium 4.6 3.5 - 5.1 mmol/L    Comment: NO VISIBLE HEMOLYSIS   Chloride 102 98 - 111 mmol/L   CO2 27 22 - 32 mmol/L   Glucose, Bld 174 (H) 70 - 99 mg/dL   BUN 31 (H) 8 - 23 mg/dL   Creatinine, Ser 3.47 (H) 0.44 - 1.00 mg/dL   Calcium 7.7 (L) 8.9 - 10.3 mg/dL   Phosphorus 5.4 (H) 2.5 - 4.6 mg/dL   Albumin 2.2 (L) 3.5 - 5.0 g/dL   GFR calc non Af Amer 13 (L) >60 mL/min   GFR calc Af Amer 16 (L) >60 mL/min   Anion gap 6 5 - 15    Comment: Performed at [facility], [place] [address], [place], [place] [address]  CBC     Status: Abnormal   Collection Time: 05/24/18  5:00 AM  Result Value Ref Range   WBC 7.5 4.0 - 10.5 K/uL   RBC 2.77 (L) 3.87 - 5.11 MIL/uL   Hemoglobin 7.7 (L) 12.0 - 15.0 g/dL   HCT 25.9 (L) 36.0 - 46.0 %   MCV 93.5 80.0 - 100.0 fL   MCH 27.8 26.0 - 34.0 pg   MCHC 29.7 (L) 30.0 - 36.0 g/dL   RDW 14.8 11.5 - 15.5 %   Platelets 218 150 - 400 K/uL   nRBC 0.0 0.0 - 0.2 %    Comment: Performed at [facility], [place] [address], [place], [place] [address]  Glucose, capillary      Status: Abnormal   Collection Time: 05/24/18  7:53 AM  Result Value Ref Range   Glucose-Capillary 152 (H) 70 - 99 mg/dL  Glucose, capillary     Status: Abnormal   Collection Time: 05/24/18 11:28 AM  Result Value Ref Range   Glucose-Capillary 137 (H) 70 - 99 mg/dL  MICRO: Follow up on blood cx IMAGING: No results found.  TTE:   1. The left ventricle has hyperdynamic systolic function, with an ejection fraction of >65%. The cavity size was normal. Left ventricular diastolic Doppler parameters are consistent with impaired relaxation.  2. The right ventricle has normal systolic function. The cavity was normal. There is no increase in right ventricular wall thickness.  3. The mitral valve is normal in structure.  4. The tricuspid valve is normal in structure.  5. The aortic valve is tricuspid Mild thickening of the aortic valve Mild calcification of the aortic valve.  6. There is a small mobile density (1.1 x 0.5cm) on the ventricular surface of the aortic valve. Challenging to visualize. Consider TEE if clinically warranted. Chest CT: There is masslike consolidation over the posterior left lower lobe measuring approximately 5 x 8.2 cm.   Assessment/Plan:  63yo F with concern for culture negative vs. [place] AV lesion, concerning for IE since she also recently had been hospitalized with pneumonia though no documented bacteremia  - recommend TEE - continue on vancomycin plus amp/sub as part of culture negative endocarditis - will plan on checking ur strep pneumonia, chlamydia species, bartonella ab. As part of culture negative work up.  Will watch for vancomycin toxicity given that she may need 6 wk of therapy   Pulmonary mass in LLL = concerning for malignancy per dr icard who at this time deferring bronchoscopy given that she came in with WCT/VT. Will address as she is more stabilized.

## 2018-05-25 DIAGNOSIS — A419 Sepsis, unspecified organism: Secondary | ICD-10-CM

## 2018-05-25 LAB — RENAL FUNCTION PANEL
Albumin: 2.3 g/dL — ABNORMAL LOW (ref 3.5–5.0)
Anion gap: 11 (ref 5–15)
BUN: 47 mg/dL — ABNORMAL HIGH (ref 8–23)
CO2: 23 mmol/L (ref 22–32)
Calcium: 8.3 mg/dL — ABNORMAL LOW (ref 8.9–10.3)
Chloride: 100 mmol/L (ref 98–111)
Creatinine, Ser: 4.88 mg/dL — ABNORMAL HIGH (ref 0.44–1.00)
GFR calc Af Amer: 10 mL/min — ABNORMAL LOW (ref >60)
GFR calc non Af Amer: 9 mL/min — ABNORMAL LOW (ref >60)
Glucose, Bld: 163 mg/dL — ABNORMAL HIGH (ref 70–99)
Phosphorus: 7.2 mg/dL — ABNORMAL HIGH (ref 2.5–4.6)
Potassium: 5.2 mmol/L — ABNORMAL HIGH (ref 3.5–5.1)
Sodium: 134 mmol/L — ABNORMAL LOW (ref 135–145)

## 2018-05-25 LAB — GLUCOSE, CAPILLARY
Glucose-Capillary: 107 mg/dL — ABNORMAL HIGH (ref 70–99)
Glucose-Capillary: 156 mg/dL — ABNORMAL HIGH (ref 70–99)
Glucose-Capillary: 165 mg/dL — ABNORMAL HIGH (ref 70–99)
Glucose-Capillary: 167 mg/dL — ABNORMAL HIGH (ref 70–99)
Glucose-Capillary: 168 mg/dL — ABNORMAL HIGH (ref 70–99)
Glucose-Capillary: 187 mg/dL — ABNORMAL HIGH (ref 70–99)

## 2018-05-25 LAB — VANCOMYCIN, RANDOM: Vancomycin Rm: 15

## 2018-05-25 MED ORDER — HEPARIN SODIUM (PORCINE) 1000 UNIT/ML IJ SOLN
2400.0000 [IU] | Freq: Once | INTRAMUSCULAR | Status: AC
  Administered 2018-05-25: 2400 [IU]
  Filled 2018-05-25: qty 3

## 2018-05-25 MED ORDER — VANCOMYCIN HCL IN DEXTROSE 1-5 GM/200ML-% IV SOLN
1000.0000 mg | Freq: Once | INTRAVENOUS | Status: DC
  Filled 2018-05-25: qty 200

## 2018-05-25 MED ORDER — SODIUM CHLORIDE 0.9 % IV SOLN
3.0000 g | Freq: Two times a day (BID) | INTRAVENOUS | Status: DC
  Administered 2018-05-25 – 2018-05-27 (×4): 3 g via INTRAVENOUS
  Filled 2018-05-25 (×5): qty 3

## 2018-05-25 MED ORDER — VANCOMYCIN HCL IN DEXTROSE 1-5 GM/200ML-% IV SOLN: 1000.0000 mg | INTRAVENOUS | Status: DC

## 2018-05-25 MED ORDER — VANCOMYCIN HCL 10 G IV SOLR
1500.0000 mg | Freq: Once | INTRAVENOUS | Status: AC
  Administered 2018-05-25: 1500 mg via INTRAVENOUS
  Filled 2018-05-25: qty 1500

## 2018-05-25 MED ORDER — SODIUM CHLORIDE 0.9 % IV SOLN
INTRAVENOUS | Status: DC
  Administered 2018-05-25 – 2018-05-27 (×2): via INTRAVENOUS

## 2018-05-25 MED ORDER — HYDRALAZINE HCL 25 MG PO TABS
25.0000 mg | ORAL_TABLET | Freq: Three times a day (TID) | ORAL | Status: DC
  Administered 2018-05-25 – 2018-05-27 (×6): 25 mg
  Filled 2018-05-25 (×10): qty 1

## 2018-05-25 MED ORDER — SODIUM CHLORIDE 0.9 % IV SOLN
3.0000 g | Freq: Two times a day (BID) | INTRAVENOUS | Status: DC
  Filled 2018-05-25: qty 3

## 2018-05-25 NOTE — Progress Notes (Signed)
NAME:  [name], MRN:  [id number], DOB:  [birth year], LOS: 4 ADMISSION DATE:  05/21/2018, CONSULTATION DATE:  05/21/2018 REFERRING MD:  Dr. Lacinda Axon, CHIEF COMPLAINT:  Respiratory Distress   History of present illness   63 year old female presents from [place] to [facility] with reported one day of respiratory distress, placed on BiPAP by EMS. On arrival to ED found to be in [place]. Given Amiodarone bolus Cardiology consulted. Preparing for cardioversion, however patient converted into wide complex tachycardia. Intubated, during intubation had notable emesis. Given Vancomycin/Zosyn. K 7.5, Crt 4.31. Administered Bicarb, Calcium, Albuterol, Insulin/Dextrose. ABG 7.15/45. LA 3.2. Transferred to [facility] for further treatment.   Recent admission 2/14-2/17 with CAP. Noted to have AKI which improved. At discharge Cr 1.63. CT Chest concerning for neoplastic process of left lower lung base.  Was treated with a course of Levaquin  Past Medical History  Obesity, HTN, Mental Health Handicap, grade 1 diastolic heart failure, Asthma Underlying obstructive lung disease  Significant Hospital Events   2/22 > Presents to ED   Consults:  PCCM 2/22 ID 2/25 - AoV Veg   Procedures:  ETT 2/22 >>   Significant Diagnostic Tests:  CT Chest 2/14 > Masslike consolidation measuring 5 x 8.2 cm over the left lower lobe  CXR 2/22 > Endotracheal tube just into the right mainstem bronchus. Recommend retracting 2-3 cm. Increasing left perihilar and lower lobe opacity could reflect associated atelectasis related to right mainstem intubation.  ECHO 2/23:   1. The left ventricle has hyperdynamic systolic function, with an ejection fraction of >65%. The cavity size was normal. Left ventricular diastolic Doppler parameters are consistent with impaired relaxation.  2. The right ventricle has normal systolic function. The cavity was normal. There is no increase in right ventricular wall thickness.  3. The mitral valve is  normal in structure.  4. The tricuspid valve is normal in structure.  5. The aortic valve is tricuspid Mild thickening of the aortic valve Mild calcification of the aortic valve.  6. There is a small mobile density (1.1 x 0.5cm) on the ventricular surface of the aortic valve. Challenging to visualize. Consider TEE if clinically warranted.  7. The pulmonic valve was normal in structure.  Micro Data:  Blood 2/22 >> Tracheal Asp 2/22 >> U/A 2/22 > Negative   Antimicrobials:  Cefepime 2/22 >> 224 stop Vancomycin 2/22 >> 2-24 stop Unasyn 224 2020 x 5 days then stop  Interim history/subjective:  05/25/2018: doing well overnight. Still failed SBT this AM. Intubated and sedated.   Objective   Blood pressure 115/63, pulse 76, temperature 98 F (36.7 C), temperature source Axillary, resp. rate (!) 22, height 5\' 4"  (1.626 m), weight 128 kg, SpO2 99 %. CVP:  [2 mmHg-15 mmHg] 2 mmHg  Vent Mode: PRVC FiO2 (%):  [40 %] 40 % Set Rate:  [22 bmp] 22 bmp Vt Set:  [430 mL] 430 mL PEEP:  [5 cmH20] 5 cmH20 Plateau Pressure:  [20 cmH20-25 cmH20] 23 cmH20   Intake/Output Summary (Last 24 hours) at 05/25/2018 1017 Last data filed at 05/25/2018 0800 Gross per 24 hour  Intake 1432.92 ml  Output 78 ml  Net 1354.92 ml   Filed Weights   05/24/18 0500 05/25/18 0500 05/25/18 0837  Weight: 125.3 kg 127.8 kg 128 kg    Examination: General: Elderly female, intubated, sedated on mechanical ventilation HENT: NCAT, sclera clear pupils reactive Neck: Endotracheal tube in place Lungs: No crackles, no wheeze, bilateral ventilated breath sounds Cardiovascular:  The rate and rhythm, S1, S2, no MRG Abdomen: Soft, nontender, nondistended, bowel sounds present Extremities: Dependent edema in the thighs Neuro: Alert, following commands GU: Foley in place  Resolved Hospital Problem list     Assessment & Plan:   Acute hypoxic/hypercarbic respiratory failure Left lower lobe consolidation versus mass  lesion-recent concern for neoplastic process -Prior CT imaging with cut off of the left lower lobe proximal bronchus with evidence of hilar adenopathy concerning for potential neoplasm History of COPD -Continue current mechanical ventilatory support -Failed spontaneous breathing trials due to inability to actuate ventilator. Off all continuous sedation at this time. She needs bronchoscopic evaluation. - Hesitant to do this due to the mobile aortic valve density.  As well as recent VT event. We will discuss risk benefits and alternatives with patient's guardian this afternoon.  1.1 x 0.5 cm mobile density of the ventricular surface of the aortic valve, Cardiac vegetation - Blood cultures no growth to date - Culture negative lab work pending -Appreciate infectious disease recommendations -At risk due to access for IHD.  Junctional Rhythm with prolonged QTC in setting of hyperkalemia  Episode of V tach Decompensated Heart Failure   Grade 1 Diastolic Heart Failure H/O HTN Plan   -On cardiac telemetry, weaned off pressors -Stable, continue to monitor   Acute on Chronic Kidney Disease with Hyperkalemia  Metabolic acidosis Lactic Acidosis   Plan  -Initially temporized with bicarb, insulin and calcium -Renal ultrasound no hydronephrosis, no mass, adrenal adenoma 3.6 x 3.6 x 2.3 - IHD per nephrology appreciate their recommendations  Hyperglycemia  DM Plan  -SSI  Leukocytosis Recent CAP Treatment  Question of endocarditis with aortic valve vegetation present Plan - TEE pending  Chronic Anemia  Plan Follow CBC -Continue DVT prophylaxis  Sedation Needs  Plan  -Off all sedation at this time  History of obstructive sleep apnea from a sleep study from 09/17/2017 Extubated will restart home BiPAP settings 18/12.    Best practice:  Diet: NPO Pain/Anxiety/Delirium protocol  VAP protocol  DVT prophylaxis: Heparin SQ GI prophylaxis: Pepcid  Glucose control: SSI  Mobility:  Bedrest  Code Status: Full Code  Family Communication: [place]  Disposition: Continue ICU care   Labs   CBC: Recent Labs  Lab 05/21/18 1515  05/21/18 2019 05/22/18 0441 05/22/18 1217 05/23/18 0329 05/24/18 0500  WBC 11.8*  --   --  8.4  --  8.1 7.5  NEUTROABS 9.3*  --   --   --   --   --   --   HGB 9.4*   < > 8.2* 7.4* 8.2* 6.7* 7.7*  HCT 32.4*   < > 24.0* 24.2* 24.0* 22.6* 25.9*  MCV 95.9  --   --  93.1  --  94.6 93.5  PLT 572*  --   --  321  --  252 218   < > = values in this interval not displayed.    Basic Metabolic Panel: Recent Labs  Lab 05/22/18 0441 05/22/18 1156 05/22/18 1217 05/23/18 0329 05/24/18 0500 05/25/18 0444  NA 133* 133* 135 133* 135 134*  K 5.6* 4.8 4.8 5.6* 4.6 5.2*  CL 100 101  --  101 102 100  CO2 22 23  --  24 27 23   GLUCOSE 169* 119*  --  139* 174* 163*  BUN 64* 49*  --  59* 31* 47*  CREATININE 3.42* 2.91*  --  4.24* 3.47* 4.88*  CALCIUM 7.7* 8.0*  --  7.9* 7.7* 8.3*  MG 2.3  --   --  2.4  --   --   PHOS 4.7*  --   --   --  5.4* 7.2*   GFR: Estimated Creatinine Clearance: 15.9 mL/min (A) (by C-G formula based on SCr of 4.88 mg/dL (H)). Recent Labs  Lab 05/21/18 1515 05/21/18 1544 05/21/18 1840 05/21/18 2000 05/22/18 0441 05/23/18 0329 05/24/18 0500  PROCALCITON  --   --   --  0.56 2.07 3.87  --   WBC 11.8*  --   --   --  8.4 8.1 7.5  LATICACIDVEN  --  3.2* 1.7  --   --   --   --     Liver Function Tests: Recent Labs  Lab 05/21/18 1515 05/21/18 2000 05/22/18 0441 05/24/18 0500 05/25/18 0444  AST 30 93*  --   --   --   ALT 20 38  --   --   --   ALKPHOS 54 56  --   --   --   BILITOT 0.3 0.6  --   --   --   PROT 7.4 6.7  --   --   --   ALBUMIN 2.9* 2.5* 2.3* 2.2* 2.3*   No results for input(s): LIPASE, AMYLASE in the last 168 hours. No results for input(s): AMMONIA in the last 168 hours.  ABG    Component Value Date/Time   PHART 7.337 (L) 05/22/2018 1217   PCO2ART 47.6 05/22/2018 1217   PO2ART 98.0  05/22/2018 1217   HCO3 25.6 05/22/2018 1217   TCO2 27 05/22/2018 1217   ACIDBASEDEF 1.0 05/22/2018 1217   O2SAT 97.0 05/22/2018 1217     Coagulation Profile: No results for input(s): INR, PROTIME in the last 168 hours.  Cardiac Enzymes: Recent Labs  Lab 05/21/18 1515 05/22/18 1156  CKTOTAL  --  129  TROPONINI <0.03  --     CBG: Recent Labs  Lab 05/24/18 1532 05/24/18 1916 05/24/18 2335 05/25/18 0355 05/25/18 0737  GLUCAP 140* 131* 149* 156* 165*    This patient is critically ill with multiple organ system failure; which, requires frequent high complexity decision making, assessment, support, evaluation, and titration of therapies. This was completed through the application of advanced monitoring technologies and extensive interpretation of multiple databases. During this encounter critical care time was devoted to patient care services described in this note for 39 minutes.   Garner Nash, DO [facility] 05/25/2018 11:31 AM  Personal pager: [phone] If unanswered, please page CCM On-call: [phone]

## 2018-05-25 NOTE — Progress Notes (Signed)
Pharmacy Antibiotic Note  [name] is a 63 y.o. female admitted on 05/21/2018 with possible culture negative endocarditis.  TTE on 2/23 showed a 1.1 x 0.5 cm vegetation on the ventricular surface of the aortic valve. Pharmacy has been consulted for vancomycin dosing. Patient was AKI on CKD and was initially on CRRT during this admission. She has since been converted to [facility] with her first session on 2/24. She did receive an initial 2 gm load of Vancomycin on 2/24 prior to HD but has not received any subsequent doses.   Vanc Random this morning = 15 and currently getting dialysis   Plan: Vancomycin 1500 mg x 1 today post-dialysis Then Vanc 1 gm Q HD MWF Monitor HD schedule, renal status, TEE results  Will also increase Unasyn to 3 gm IV Q 12 to target endocarditis   Height: 5\' 4"  (162.6 cm) Weight: 282 lb 3 oz (128 kg) IBW/kg (Calculated) : 54.7  Temp (24hrs), Avg:99.2 F (37.3 C), Min:98 F (36.7 C), Max:99.5 F (37.5 C)  Recent Labs  Lab 05/21/18 1515 05/21/18 1544 05/21/18 1840  05/22/18 0441 05/22/18 1156 05/23/18 0329 05/24/18 0500 05/25/18 0444 05/25/18 1006  WBC 11.8*  --   --   --  8.4  --  8.1 7.5  --   --   CREATININE 4.31*  --   --    < > 3.42* 2.91* 4.24* 3.47* 4.88*  --   LATICACIDVEN  --  3.2* 1.7  --   --   --   --   --   --   --   VANCORANDOM  --   --   --   --   --   --   --   --   --  15   < > = values in this interval not displayed.    Estimated Creatinine Clearance: 15.9 mL/min (A) (by C-G formula based on SCr of 4.88 mg/dL (H)).    No Known Allergies   Thank you for allowing pharmacy to be a part of this patient's care.  Jimmy Footman, PharmD, BCPS, BCIDP Infectious Diseases Clinical Pharmacist Phone: [phone] 05/25/2018 11:00 AM

## 2018-05-25 NOTE — Progress Notes (Signed)
   [name] has been requested to perform a transesophageal echocardiogram on [name] for aortic valve vegetation noted on transthoracic echocardiogram.  After careful review of history and examination, the risks and benefits of transesophageal echocardiogram have been explained including risks of esophageal damage, perforation (1:10,000 risk), bleeding, pharyngeal hematoma as well as other potential complications associated with conscious sedation including aspiration, arrhythmia, respiratory failure and death. Alternatives to treatment were discussed, questions were answered. Patient is currently intubated and has a legal guardian.   Spoke with legal guardian, Betsey Amen, on the phone who gave consent to proceed with the procedure. Sindy Messing, RN, and I both signed the consent form and it was placed in the patient's paper chart.   Darreld Mclean, PA-C 05/25/2018 3:04 PM

## 2018-05-25 NOTE — Progress Notes (Signed)
Patient ID: [name], female   DOB: 1955-12-29, 63 y.o.   MRN: [id number] S: ECHO revealed new AV vegetation.  ID consulted. O:BP 120/63   Pulse 71 Comment: Simultaneous filing. User may not have seen previous data.  Temp 99 F (37.2 C)   Resp (!) 22 Comment: Simultaneous filing. User may not have seen previous data.  Ht '5\' 4"'$  (1.626 m)   Wt 128 kg   SpO2 100%   BMI 48.44 kg/m   Intake/Output Summary (Last 24 hours) at 05/25/2018 1126 Last data filed at 05/25/2018 1100 Gross per 24 hour  Intake 1805.92 ml  Output 83 ml  Net 1722.92 ml   Intake/Output: I/O last 3 completed shifts: In: 2448.7 [I.V.:717.5; NG/GT:1407; IV Piggyback:324.1] Out: 3128 [Urine:78; Emesis/NG output:50; Other:3000]  Intake/Output this shift:  Total I/O In: 312.8 [I.V.:10.7; NG/GT:240; IV Piggyback:62.1] Out: 20 [Urine:20] Weight change: -0.7 kg Gen: Intubated but awake and following commands CVS: no rub Resp: cta Abd: benign Ext: no edema  Recent Labs  Lab 05/21/18 1515  05/21/18 2000  05/21/18 2326 05/22/18 0441 05/22/18 1156 05/22/18 1217 05/23/18 0329 05/24/18 0500 05/25/18 0444  NA 126*   < > 127*   < > 131* 133* 133* 135 133* 135 134*  K >7.5*   < > 7.5*   < > 6.4* 5.6* 4.8 4.8 5.6* 4.6 5.2*  CL 100  --  101  --  101 100 101  --  101 102 100  CO2 15*  --  19*  --  21* 22 23  --  '24 27 23  '$ GLUCOSE 322*  --  391*  --  215* 169* 119*  --  139* 174* 163*  BUN 91*  --  89*  --  80* 64* 49*  --  59* 31* 47*  CREATININE 4.31*  --  4.65*  --  4.08* 3.42* 2.91*  --  4.24* 3.47* 4.88*  ALBUMIN 2.9*  --  2.5*  --   --  2.3*  --   --   --  2.2* 2.3*  CALCIUM 8.4*  --  8.1*  --  7.9* 7.7* 8.0*  --  7.9* 7.7* 8.3*  PHOS  --   --   --   --   --  4.7*  --   --   --  5.4* 7.2*  AST 30  --  93*  --   --   --   --   --   --   --   --   ALT 20  --  38  --   --   --   --   --   --   --   --    < > = values in this interval not displayed.   Liver Function Tests: Recent Labs  Lab 05/21/18 1515  05/21/18 2000 05/22/18 0441 05/24/18 0500 05/25/18 0444  AST 30 93*  --   --   --   ALT 20 38  --   --   --   ALKPHOS 54 56  --   --   --   BILITOT 0.3 0.6  --   --   --   PROT 7.4 6.7  --   --   --   ALBUMIN 2.9* 2.5* 2.3* 2.2* 2.3*   No results for input(s): LIPASE, AMYLASE in the last 168 hours. No results for input(s): AMMONIA in the last 168 hours. CBC: Recent Labs  Lab 05/21/18 1515  05/22/18 0441  05/22/18 1217 05/23/18 0329 05/24/18 0500  WBC 11.8*  --  8.4  --  8.1 7.5  NEUTROABS 9.3*  --   --   --   --   --   HGB 9.4*   < > 7.4* 8.2* 6.7* 7.7*  HCT 32.4*   < > 24.2* 24.0* 22.6* 25.9*  MCV 95.9  --  93.1  --  94.6 93.5  PLT 572*  --  321  --  252 218   < > = values in this interval not displayed.   Cardiac Enzymes: Recent Labs  Lab 05/21/18 1515 05/22/18 1156  CKTOTAL  --  129  TROPONINI <0.03  --    CBG: Recent Labs  Lab 05/24/18 1532 05/24/18 1916 05/24/18 2335 05/25/18 0355 05/25/18 0737  GLUCAP 140* 131* 149* 156* 165*    Iron Studies: No results for input(s): IRON, TIBC, TRANSFERRIN, FERRITIN in the last 72 hours. Studies/Results: No results found. Marland Kitchen arformoterol  15 mcg Nebulization BID  . budesonide (PULMICORT) nebulizer solution  0.5 mg Nebulization BID  . chlorhexidine gluconate (MEDLINE KIT)  15 mL Mouth Rinse BID  . famotidine  20 mg Per Tube Daily  . feeding supplement (PRO-STAT SUGAR FREE 64)  30 mL Per Tube BID  . heparin  2,400 Units Intracatheter Once  . heparin  5,000 Units Subcutaneous Q8H  . hydrALAZINE  25 mg Per Tube Q8H  . insulin aspart  2-6 Units Subcutaneous Q4H  . ipratropium-albuterol  3 mL Nebulization Q6H  . mouth rinse  15 mL Mouth Rinse 10 times per day    BMET    Component Value Date/Time   NA 134 (L) 05/25/2018 0444   K 5.2 (H) 05/25/2018 0444   CL 100 05/25/2018 0444   CO2 23 05/25/2018 0444   GLUCOSE 163 (H) 05/25/2018 0444   BUN 47 (H) 05/25/2018 0444   CREATININE 4.88 (H) 05/25/2018 0444    CREATININE 1.50 (H) 02/16/2018 0741   CALCIUM 8.3 (L) 05/25/2018 0444   GFRNONAA 9 (L) 05/25/2018 0444   GFRNONAA 37 (L) 02/16/2018 0741   GFRAA 10 (L) 05/25/2018 0444   GFRAA 43 (L) 02/16/2018 0741   CBC    Component Value Date/Time   WBC 7.5 05/24/2018 0500   RBC 2.77 (L) 05/24/2018 0500   HGB 7.7 (L) 05/24/2018 0500   HCT 25.9 (L) 05/24/2018 0500   PLT 218 05/24/2018 0500   MCV 93.5 05/24/2018 0500   MCH 27.8 05/24/2018 0500   MCHC 29.7 (L) 05/24/2018 0500   RDW 14.8 05/24/2018 0500   LYMPHSABS 1.4 05/21/2018 1515   MONOABS 0.6 05/21/2018 1515   EOSABS 0.1 05/21/2018 1515   BASOSABS 0.1 05/21/2018 1515    Assessment/Plan:  1. AKI/CKD stage 3 in setting of sepsis vs cardiorenal syndrome and concomitant ARB therapy. Also with volume overload and low FeNa. Started on CVVHD 05/21/18 due to volume and hyperkalemia. transitioned to IHD on 05/23/18.  Remains oliguric and dialysis dependent. 1. Temp HD cath placed 05/21/18 2. Seen on HD today and tolerating it well but decreased UF goal due to dropping BP. 3. Continue with IHD on MWF schedule for now.  2. Aortic valve vegetation- concern for culture negative vs marantic AV lesion.  Awaiting TEE and cont with vanc plus zosyn per ID. 3. Hyperkalemia- due to #1 and protein supplements.  Discussed with RD who changed to different formulation of protein supplementation with less potassium 1. added furosemide IV 05/24/18 to see if this will help her  K and UOP.  4. Acute on chronic dCHF- now on HD for UF 5. VDRF- hypoxic and hypercarbic. Mass vs consolidation of LLL. Per PCCM 6. Lung mass - in LLL present in 2014 and appears to have progressed. Will need CT vs. PET scan for follow up. 7. Anemia of CKD- transfuse prn. 8. HTN- was on ARB/hydralazine/lasix pta. On hold for now 9. AMS 10. DM type 2 Donetta Potts, MD [facility] [phone]

## 2018-05-26 ENCOUNTER — Encounter (HOSPITAL_COMMUNITY)
Admission: EM | Disposition: A | Discharge status: Skilled Nursing Facility | Payer: Self-pay | Source: Home / Self Care | Attending: Internal Medicine

## 2018-05-26 ENCOUNTER — Ambulatory Visit: Payer: Medicare Other | Admitting: "Endocrinology

## 2018-05-26 ENCOUNTER — Inpatient Hospital Stay (HOSPITAL_COMMUNITY): Payer: Medicare Other

## 2018-05-26 DIAGNOSIS — I358 Other nonrheumatic aortic valve disorders: Secondary | ICD-10-CM

## 2018-05-26 DIAGNOSIS — Z992 Dependence on renal dialysis: Secondary | ICD-10-CM

## 2018-05-26 DIAGNOSIS — Q211 Atrial septal defect: Secondary | ICD-10-CM

## 2018-05-26 DIAGNOSIS — N186 End stage renal disease: Secondary | ICD-10-CM

## 2018-05-26 LAB — CULTURE, BLOOD (ROUTINE X 2)
Culture: NO GROWTH
Culture: NO GROWTH
Special Requests: ADEQUATE
Special Requests: ADEQUATE

## 2018-05-26 LAB — GLUCOSE, CAPILLARY
Glucose-Capillary: 120 mg/dL — ABNORMAL HIGH (ref 70–99)
Glucose-Capillary: 133 mg/dL — ABNORMAL HIGH (ref 70–99)
Glucose-Capillary: 135 mg/dL — ABNORMAL HIGH (ref 70–99)
Glucose-Capillary: 136 mg/dL — ABNORMAL HIGH (ref 70–99)
Glucose-Capillary: 154 mg/dL — ABNORMAL HIGH (ref 70–99)
Glucose-Capillary: 171 mg/dL — ABNORMAL HIGH (ref 70–99)

## 2018-05-26 LAB — STREP PNEUMONIAE URINARY ANTIGEN: Strep Pneumo Urinary Antigen: POSITIVE — AB

## 2018-05-26 LAB — RENAL FUNCTION PANEL
Albumin: 2.1 g/dL — ABNORMAL LOW (ref 3.5–5.0)
Anion gap: 10 (ref 5–15)
BUN: 36 mg/dL — ABNORMAL HIGH (ref 8–23)
CO2: 26 mmol/L (ref 22–32)
Calcium: 7.7 mg/dL — ABNORMAL LOW (ref 8.9–10.3)
Chloride: 99 mmol/L (ref 98–111)
Creatinine, Ser: 3.76 mg/dL — ABNORMAL HIGH (ref 0.44–1.00)
GFR calc Af Amer: 14 mL/min — ABNORMAL LOW (ref >60)
GFR calc non Af Amer: 12 mL/min — ABNORMAL LOW (ref >60)
Glucose, Bld: 192 mg/dL — ABNORMAL HIGH (ref 70–99)
Phosphorus: 4.6 mg/dL (ref 2.5–4.6)
Potassium: 4.4 mmol/L (ref 3.5–5.1)
Sodium: 135 mmol/L (ref 135–145)

## 2018-05-26 LAB — HIV ANTIBODY (ROUTINE TESTING W REFLEX): HIV Screen 4th Generation wRfx: NONREACTIVE

## 2018-05-26 SURGERY — VIDEO BRONCHOSCOPY WITHOUT FLUORO
Anesthesia: Moderate Sedation | Laterality: Bilateral

## 2018-05-26 SURGERY — ECHOCARDIOGRAM, TRANSESOPHAGEAL
Anesthesia: Moderate Sedation

## 2018-05-26 MED ORDER — METOPROLOL TARTRATE 5 MG/5ML IV SOLN
INTRAVENOUS | Status: AC
  Filled 2018-05-26: qty 5

## 2018-05-26 MED ORDER — METOPROLOL TARTRATE 5 MG/5ML IV SOLN: 5.0000 mg | INTRAVENOUS | Status: DC | PRN

## 2018-05-26 MED ORDER — METOPROLOL TARTRATE 5 MG/5ML IV SOLN
INTRAVENOUS | Status: AC
  Administered 2018-05-26: 5 mg via INTRAVENOUS
  Filled 2018-05-26: qty 5

## 2018-05-26 MED ORDER — FENTANYL CITRATE (PF) 100 MCG/2ML IJ SOLN
50.0000 ug | INTRAMUSCULAR | Status: DC | PRN
  Administered 2018-05-26 – 2018-05-28 (×5): 50 ug via INTRAVENOUS
  Filled 2018-05-26 (×2): qty 2

## 2018-05-26 MED ORDER — METOPROLOL TARTRATE 25 MG/10 ML ORAL SUSPENSION: 25.0000 mg | Freq: Three times a day (TID) | ORAL | Status: DC

## 2018-05-26 MED ORDER — SODIUM CHLORIDE 0.9 % IV BOLUS
500.0000 mL | Freq: Once | INTRAVENOUS | Status: AC
  Administered 2018-05-26: 500 mL via INTRAVENOUS

## 2018-05-26 MED ORDER — METOPROLOL TARTRATE 5 MG/5ML IV SOLN
5.0000 mg | Freq: Once | INTRAVENOUS | Status: AC
  Administered 2018-05-26: 5 mg via INTRAVENOUS

## 2018-05-26 MED ORDER — METOPROLOL TARTRATE 25 MG/10 ML ORAL SUSPENSION
12.5000 mg | Freq: Three times a day (TID) | ORAL | Status: DC
  Administered 2018-05-26 – 2018-05-27 (×3): 12.5 mg via ORAL
  Filled 2018-05-26 (×5): qty 5

## 2018-05-26 MED ORDER — METOPROLOL TARTRATE 5 MG/5ML IV SOLN
5.0000 mg | INTRAVENOUS | Status: DC | PRN
  Administered 2018-05-26 – 2018-05-27 (×6): 5 mg via INTRAVENOUS
  Filled 2018-05-26 (×6): qty 5

## 2018-05-26 MED ORDER — METOPROLOL TARTRATE 25 MG/10 ML ORAL SUSPENSION
25.0000 mg | Freq: Once | ORAL | Status: AC
  Administered 2018-05-26: 25 mg
  Filled 2018-05-26: qty 10

## 2018-05-26 MED ORDER — FENTANYL CITRATE (PF) 100 MCG/2ML IJ SOLN
INTRAMUSCULAR | Status: AC
  Administered 2018-05-26: 50 ug via INTRAVENOUS
  Filled 2018-05-26: qty 4

## 2018-05-26 MED ORDER — PROPOFOL 1000 MG/100ML IV EMUL
5.0000 ug/kg/min | INTRAVENOUS | Status: DC
  Administered 2018-05-26: 5 ug/kg/min via INTRAVENOUS
  Filled 2018-05-26 (×2): qty 100

## 2018-05-26 NOTE — Procedures (Signed)
Video Bronchoscopy Procedure Note  Date of Operation: 05/26/2018  Pre-op Diagnosis: LLL lung mass   Post-op Diagnosis: Same  Surgeon: Garner Nash, DO   Assistants: none  Anesthesia: intubated in ICU, on propofol and prn fentanyl given  Meds Given: fentanyl 122mg, propofol infusion titrated to effect   Operation: Flexible video fiberoptic bronchoscopy and biopsies.  Estimated Blood Loss: <<7WG Complications: none noted  Indications and History: Anne LUBKEis 63y.o. with CT evidence concerning for left lower lobe lung mass.  Recommendation was to perform video fiberoptic bronchoscopy with biopsies. The risks, benefits, complications, treatment options and expected outcomes were discussed with the patient's legal guardian.  The possibilities of pneumothorax, pneumonia, reaction to medication, pulmonary aspiration, perforation of a viscus, bleeding, failure to diagnose a condition and creating a complication requiring transfusion or operation were discussed with the patient who freely signed the consent.    Description of Procedure: The patient was seen in the ICU, was examined and was deemed appropriate to proceed.  The patient was, identified as Anne Cellaand the procedure verified as Flexible Video Fiberoptic Bronchoscopy.  A Time Out was held and the above information confirmed.   Sedation was provided by ICU nursing staff and myself to include a continuous propofol infusion and PRN fentanyl. The video fiberoptic bronchoscope was introduced via the pre-existing endotracheal tube and a general inspection was performed which menstruated normal trachea, normal main carina. The R sided airways were inspected and showed normal RUL, BI, RML and RLL. The L side was then inspected. The LLL, Lingular and LUL airways.  There was near-total occlusion of the lateral and posterior segment of the left lower lobe with visible endobronchial tumor present.  Additionally along the  anterior medial wall of the left mainstem there was a visible tumor.  All surrounding mucosa was hyperemic and friable.  A Wang needle bx was performed to the left lower lobe lesion.  Upon the third past there was significant blood return and the needle use was discontinued.  We also completed endobronchial brushings and 5 endobronchial forceps biopsies were performed of the left lower lobe lesion as well as the lesion seen upon the anterior medial left wall of the mainstem. There was some initial bleeding that stopped quickly. Finally endobronchial washings were performed in the left mainstem to be sent for cytology.  Finally, we completed a BAL to the left lower lobe to be sent for cultures as well as cytology.  The patient tolerated the procedure well. The bronchoscope was removed. There were no obvious complications.   Samples: 1. Endobronchial brushings from left lower lobe 2. Wang needle biopsies from lower lobe endobronchial lesion 3. Endobronchial forceps biopsies from left lower lobe 4. Bronchial washings from left lower lobe 5. BAL from left lower lobe   Plans:  We will review the cytology, pathology and microbiology results with the patient when they become available.    LEFT LOWER LOBE LESION:     BGarner Nash DO [place] [facility] 05/26/2018 12:21 PM  Personal pager: #[phone] unanswered, please page CCM On-call: #[phone]

## 2018-05-26 NOTE — Consult Note (Signed)
   [facility] CM Inpatient Consult   05/26/2018  [name] 02-02-1956 [id number]   Patient chart has been reviewed for readmissions less than 30 days and for high risk score, 23%, for unplanned readmissions.   Chart review reveals patient long term care resident at [facility]. No [facility] Care Management needs.  Anne Cedars, MSN, [facility] Liaison Nurse Mobile Phone [phone]  Toll free office [phone]

## 2018-05-26 NOTE — CV Procedure (Signed)
INDICATIONS: aortic valve vegetation  PROCEDURE:   Informed consent was obtained prior to the procedure. The risks, benefits and alternatives for the procedure were discussed and the patient comprehended these risks.  Risks include, but are not limited to, cough, sore throat, vomiting, nausea, somnolence, esophageal and stomach trauma or perforation, bleeding, low blood pressure, aspiration, pneumonia, infection, trauma to the teeth and death.    After a procedural time-out, sedation was administered.  During this procedure the patient was administered propofol and fentanyl at the direction of Dr. Gardiner Fanti to achieve and maintain moderate sedation.  The patient's heart rate, blood pressure, and oxygen saturationweare monitored continuously during the procedure. The period of conscious sedation was 30 minutes, of which I was present face-to-face 100% of this time.  The transesophageal probe was inserted in the esophagus and stomach without difficulty and multiple views were obtained.  The patient was kept under observation until the procedure was complete. The procedure was performed at the bedside.   Agitated microbubble saline contrast was not administered.  COMPLICATIONS:    There were no immediate complications.  FINDINGS:  Aortic valve lesion noted, which appears to be heterogenous and calcified, suggesting a possible subacute/healed vegetation or calcified lesion in the setting of dialysis. Trivial aortic valve regurgitation. No lesions noted on mitral valve, tricuspid valve or pulmonary valve.   RECOMMENDATIONS:    Discussed with Dr. Valeta Harms, no other recommendations at this time.   Time Spent Directly with the Patient:  60 minutes   [name] 05/26/2018, 11:21 AM

## 2018-05-26 NOTE — Progress Notes (Signed)
Patient ID: [name], female   DOB: Aug 27, 1955, 63 y.o.   MRN: [id number] S:She did well overnight, no issues O:BP 133/72   Pulse (!) 105   Temp 100 F (37.8 C)   Resp (!) 28   Ht '5\' 4"'$  (1.626 m)   Wt 127.1 kg   SpO2 99%   BMI 48.10 kg/m   Intake/Output Summary (Last 24 hours) at 05/26/2018 1137 Last data filed at 05/26/2018 0900 Gross per 24 hour  Intake 1275.41 ml  Output 2597 ml  Net -1321.59 ml   Intake/Output: I/O last 3 completed shifts: In: 2683.7 [I.V.:461.8; NG/GT:1360; IV Piggyback:861.9] Out: 2647 [Urine:147; Other:2500]  Intake/Output this shift:  Total I/O In: 40.1 [I.V.:40.1] Out: 15 [Urine:15] Weight change: 0.2 kg Gen: WD obese WF intubated but awake and alert CVS: tachy at 105 Resp: cta Abd: obese +BS, soft, NT Ext: no edema  Recent Labs  Lab 05/21/18 1515  05/21/18 2000  05/21/18 2326 05/22/18 0441 05/22/18 1156 05/22/18 1217 05/23/18 0329 05/24/18 0500 05/25/18 0444 05/26/18 0420  NA 126*   < > 127*   < > 131* 133* 133* 135 133* 135 134* 135  K >7.5*   < > 7.5*   < > 6.4* 5.6* 4.8 4.8 5.6* 4.6 5.2* 4.4  CL 100  --  101  --  101 100 101  --  101 102 100 99  CO2 15*  --  19*  --  21* 22 23  --  '24 27 23 26  '$ GLUCOSE 322*  --  391*  --  215* 169* 119*  --  139* 174* 163* 192*  BUN 91*  --  89*  --  80* 64* 49*  --  59* 31* 47* 36*  CREATININE 4.31*  --  4.65*  --  4.08* 3.42* 2.91*  --  4.24* 3.47* 4.88* 3.76*  ALBUMIN 2.9*  --  2.5*  --   --  2.3*  --   --   --  2.2* 2.3* 2.1*  CALCIUM 8.4*  --  8.1*  --  7.9* 7.7* 8.0*  --  7.9* 7.7* 8.3* 7.7*  PHOS  --   --   --   --   --  4.7*  --   --   --  5.4* 7.2* 4.6  AST 30  --  93*  --   --   --   --   --   --   --   --   --   ALT 20  --  38  --   --   --   --   --   --   --   --   --    < > = values in this interval not displayed.   Liver Function Tests: Recent Labs  Lab 05/21/18 1515 05/21/18 2000  05/24/18 0500 05/25/18 0444 05/26/18 0420  AST 30 93*  --   --   --   --   ALT 20 38   --   --   --   --   ALKPHOS 54 56  --   --   --   --   BILITOT 0.3 0.6  --   --   --   --   PROT 7.4 6.7  --   --   --   --   ALBUMIN 2.9* 2.5*   < > 2.2* 2.3* 2.1*   < > = values in this interval not displayed.   No  results for input(s): LIPASE, AMYLASE in the last 168 hours. No results for input(s): AMMONIA in the last 168 hours. CBC: Recent Labs  Lab 05/21/18 1515  05/22/18 0441 05/22/18 1217 05/23/18 0329 05/24/18 0500  WBC 11.8*  --  8.4  --  8.1 7.5  NEUTROABS 9.3*  --   --   --   --   --   HGB 9.4*   < > 7.4* 8.2* 6.7* 7.7*  HCT 32.4*   < > 24.2* 24.0* 22.6* 25.9*  MCV 95.9  --  93.1  --  94.6 93.5  PLT 572*  --  321  --  252 218   < > = values in this interval not displayed.   Cardiac Enzymes: Recent Labs  Lab 05/21/18 1515 05/22/18 1156  CKTOTAL  --  129  TROPONINI <0.03  --    CBG: Recent Labs  Lab 05/25/18 1917 05/25/18 2351 05/26/18 0341 05/26/18 0739 05/26/18 1115  GLUCAP 167* 168* 171* 136* 133*    Iron Studies: No results for input(s): IRON, TIBC, TRANSFERRIN, FERRITIN in the last 72 hours. Studies/Results: No results found. Marland Kitchen arformoterol  15 mcg Nebulization BID  . budesonide (PULMICORT) nebulizer solution  0.5 mg Nebulization BID  . chlorhexidine gluconate (MEDLINE KIT)  15 mL Mouth Rinse BID  . famotidine  20 mg Per Tube Daily  . feeding supplement (PRO-STAT SUGAR FREE 64)  30 mL Per Tube BID  . fentaNYL      . heparin  5,000 Units Subcutaneous Q8H  . hydrALAZINE  25 mg Per Tube Q8H  . insulin aspart  2-6 Units Subcutaneous Q4H  . ipratropium-albuterol  3 mL Nebulization Q6H  . mouth rinse  15 mL Mouth Rinse 10 times per day    BMET    Component Value Date/Time   NA 135 05/26/2018 0420   K 4.4 05/26/2018 0420   CL 99 05/26/2018 0420   CO2 26 05/26/2018 0420   GLUCOSE 192 (H) 05/26/2018 0420   BUN 36 (H) 05/26/2018 0420   CREATININE 3.76 (H) 05/26/2018 0420   CREATININE 1.50 (H) 02/16/2018 0741   CALCIUM 7.7 (L) 05/26/2018 0420    GFRNONAA 12 (L) 05/26/2018 0420   GFRNONAA 37 (L) 02/16/2018 0741   GFRAA 14 (L) 05/26/2018 0420   GFRAA 43 (L) 02/16/2018 0741   CBC    Component Value Date/Time   WBC 7.5 05/24/2018 0500   RBC 2.77 (L) 05/24/2018 0500   HGB 7.7 (L) 05/24/2018 0500   HCT 25.9 (L) 05/24/2018 0500   PLT 218 05/24/2018 0500   MCV 93.5 05/24/2018 0500   MCH 27.8 05/24/2018 0500   MCHC 29.7 (L) 05/24/2018 0500   RDW 14.8 05/24/2018 0500   LYMPHSABS 1.4 05/21/2018 1515   MONOABS 0.6 05/21/2018 1515   EOSABS 0.1 05/21/2018 1515   BASOSABS 0.1 05/21/2018 1515     Assessment/Plan:  1. AKI/CKD stage 3 in setting of sepsis vs cardiorenal syndrome and concomitant ARB therapy. Also with volume overload and low FeNa. Started on CVVHD 05/21/18 due to volume and hyperkalemia. transitioned to IHD on 05/23/18.  Remains oliguric and dialysis dependent. 1. Temp HD cath placed 05/21/18 2. Tolerated HD well 05/25/18 but decreased UF goal due to dropping BP. 3. Continue with IHD on MWF schedule for now.  4. Remains oliguric 2. Aortic valve vegetation- concern for culture negative vs mar[place] AV lesion.   1. For TEE today 2. cont with vanc plus zosyn per ID. 3. Hyperkalemia- due to #1  and protein supplements. Discussed with RD who changed to different formulation of protein supplementation with less potassium 1. added furosemide IV 05/24/18 to see if this will help her K and UOP.  2. K stable 4. Acute on chronic dCHF- now on HD for UF.  Started lasix without much UOP.  5. VDRF- hypoxic and hypercarbic. Mass vs consolidation of LLL. Per PCCM 6. Lung mass - in LLL present in 2014 and appears to have progressed. Will need CT vs. PET scan for follow up. 1. Dr. Valeta Harms to attempt bronch to help with ventillation and identification of mass. 7. Anemia of CKD- transfuse prn. 8. HTN- was on ARB/hydralazine/lasix pta. On hold for now 9. AMS 10. DM type 2  Donetta Potts, MD [facility] [phone]

## 2018-05-26 NOTE — Progress Notes (Signed)
NAME:  [name], MRN:  [id number], DOB:  1955-12-08, LOS: 5 ADMISSION DATE:  05/21/2018, CONSULTATION DATE:  05/21/2018 REFERRING MD:  Dr. Lacinda Axon, CHIEF COMPLAINT:  Respiratory Distress   History of present illness   63 year old female presents from [place] to [facility] with reported one day of respiratory distress, placed on BiPAP by EMS. On arrival to ED found to be in [place]. Given Amiodarone bolus Cardiology consulted. Preparing for cardioversion, however patient converted into wide complex tachycardia. Intubated, during intubation had notable emesis. Given Vancomycin/Zosyn. K 7.5, Crt 4.31. Administered Bicarb, Calcium, Albuterol, Insulin/Dextrose. ABG 7.15/45. LA 3.2. Transferred to [facility] for further treatment.   Recent admission 2/14-2/17 with CAP. Noted to have AKI which improved. At discharge Cr 1.63. CT Chest concerning for neoplastic process of left lower lung base.  Was treated with a course of Levaquin  Past Medical History  Obesity, HTN, Mental Health Handicap, grade 1 diastolic heart failure, Asthma Underlying obstructive lung disease  Significant Hospital Events   2/22 > Presents to ED   Consults:  PCCM 2/22 ID 2/25 - AoV Veg   Procedures:  ETT 2/22 >>   Significant Diagnostic Tests:  CT Chest 2/14 > Masslike consolidation measuring 5 x 8.2 cm over the left lower lobe  CXR 2/22 > Endotracheal tube just into the right mainstem bronchus. Recommend retracting 2-3 cm. Increasing left perihilar and lower lobe opacity could reflect associated atelectasis related to right mainstem intubation.  ECHO 2/23:   1. The left ventricle has hyperdynamic systolic function, with an ejection fraction of >65%. The cavity size was normal. Left ventricular diastolic Doppler parameters are consistent with impaired relaxation.  2. The right ventricle has normal systolic function. The cavity was normal. There is no increase in right ventricular wall thickness.  3. The mitral valve is  normal in structure.  4. The tricuspid valve is normal in structure.  5. The aortic valve is tricuspid Mild thickening of the aortic valve Mild calcification of the aortic valve.  6. There is a small mobile density (1.1 x 0.5cm) on the ventricular surface of the aortic valve. Challenging to visualize. Consider TEE if clinically warranted.  7. The pulmonic valve was normal in structure.  Micro Data:  Blood 2/22 >> Tracheal Asp 2/22 >> U/A 2/22 > Negative   Antimicrobials:  Cefepime 2/22 >> 224 stop Vancomycin 2/22 >> 2-24 stop Unasyn 224 2020 x 5 days then stop  Interim history/subjective:  05/26/2018: No issues overnight.  Planned TEE and bronchoscopy today in the intensive care unit.  All risk benefits and alternatives discussed with the patient's legal guardian prior to procedures.  Consent obtained via phone and located within the chart.  Patient remains intubated on mechanical ventilation.  Objective   Blood pressure 133/72, pulse (!) 105, temperature 100 F (37.8 C), resp. rate (!) 28, height 5\' 4"  (1.626 m), weight 127.1 kg, SpO2 99 %. CVP:  [9 mmHg-13 mmHg] 9 mmHg  Vent Mode: PRVC FiO2 (%):  [40 %] 40 % Set Rate:  [22 bmp] 22 bmp Vt Set:  [430 mL] 430 mL PEEP:  [5 cmH20] 5 cmH20 Pressure Support:  [5 cmH20-10 cmH20] 10 cmH20 Plateau Pressure:  [21 TKW40-97 cmH20] 24 cmH20   Intake/Output Summary (Last 24 hours) at 05/26/2018 1115 Last data filed at 05/26/2018 0900 Gross per 24 hour  Intake 1275.41 ml  Output 2597 ml  Net -1321.59 ml   Filed Weights   05/25/18 0837 05/25/18 1247 05/26/18 0421  Weight: 128 kg 126 kg 127.1 kg    Examination: General: Elderly female, intubated on mechanical ventilation HENT: NCAT, sclera clear pupils reactive Neck: Endotracheal tube in place Lungs: No crackles, no wheeze, bilateral ventilated breath sounds Cardiovascular: Regular rate and rhythm, S1-S2, no MRG Abdomen: Soft, nontender, nondistended, bowel sounds present, morbidly  obese pannus Extremities: Large legs, no significant edema Neuro: Alert, following commands no focal deficit GU: Foley in place  Resolved Hospital Problem list     Assessment & Plan:   Acute hypoxic/hypercarbic respiratory failure Left lower lobe consolidation versus mass lesion-recent concern for neoplastic process -Prior CT imaging with cut off of the left lower lobe proximal bronchus with evidence of hilar adenopathy concerning for potential neoplasm History of COPD -At this time we will continue current mechanical ventilatory support. -Plan procedures today for TEE and bronchoscopy. -After bronchoscopy and TEE will plan for potential liberation from the ventilator. Sedation will be given with propofol. -All risk benefits and alternatives were discussed with patient's legal guardian to obtain consent prior to doing procedures.  1.1 x 0.5 cm mobile density of the ventricular surface of the aortic valve, Cardiac vegetation - Blood cultures no growth to date - Culture negative lab work pending -Appreciate infectious disease recommendations as well as cardiology's recommendations regarding TEE. -Antibiotics per infectious disease  Junctional Rhythm with prolonged QTC in setting of hyperkalemia  Episode of V tach Decompensated Heart Failure   Grade 1 Diastolic Heart Failure H/O HTN Plan   -He is on cardiac telemetry We will continue to follow.  Volume management per nephrology with HD.   Acute on Chronic Kidney Disease with Hyperkalemia  Metabolic acidosis Lactic Acidosis   Plan  -Initially temporized with bicarb, insulin and calcium -Renal ultrasound no hydronephrosis, no mass, adrenal adenoma 3.6 x 3.6 x 2.3 - IHD per nephrology  Hyperglycemia  DM Plan  SSI  Chronic Anemia  Plan Follow CBC -Continue with DVT prophylaxis  History of obstructive sleep apnea from a sleep study from 09/17/2017 -Once extubated will need BiPAP 18/12  Best practice:  Diet:  NPO Pain/Anxiety/Delirium protocol  VAP protocol  DVT prophylaxis: Heparin SQ GI prophylaxis: Pepcid  Glucose control: SSI  Mobility: Bedrest  Code Status: Full Code  Family Communication: [place]  Disposition: Continue ICU care   Labs   CBC: Recent Labs  Lab 05/21/18 1515  05/21/18 2019 05/22/18 0441 05/22/18 1217 05/23/18 0329 05/24/18 0500  WBC 11.8*  --   --  8.4  --  8.1 7.5  NEUTROABS 9.3*  --   --   --   --   --   --   HGB 9.4*   < > 8.2* 7.4* 8.2* 6.7* 7.7*  HCT 32.4*   < > 24.0* 24.2* 24.0* 22.6* 25.9*  MCV 95.9  --   --  93.1  --  94.6 93.5  PLT 572*  --   --  321  --  252 218   < > = values in this interval not displayed.    Basic Metabolic Panel: Recent Labs  Lab 05/22/18 0441 05/22/18 1156 05/22/18 1217 05/23/18 0329 05/24/18 0500 05/25/18 0444 05/26/18 0420  NA 133* 133* 135 133* 135 134* 135  K 5.6* 4.8 4.8 5.6* 4.6 5.2* 4.4  CL 100 101  --  101 102 100 99  CO2 22 23  --  24 27 23 26   GLUCOSE 169* 119*  --  139* 174* 163* 192*  BUN 64* 49*  --  59* 31* 47* 36*  CREATININE 3.42* 2.91*  --  4.24* 3.47* 4.88* 3.76*  CALCIUM 7.7* 8.0*  --  7.9* 7.7* 8.3* 7.7*  MG 2.3  --   --  2.4  --   --   --   PHOS 4.7*  --   --   --  5.4* 7.2* 4.6   GFR: Estimated Creatinine Clearance: 20.5 mL/min (A) (by C-G formula based on SCr of 3.76 mg/dL (H)). Recent Labs  Lab 05/21/18 1515 05/21/18 1544 05/21/18 1840 05/21/18 2000 05/22/18 0441 05/23/18 0329 05/24/18 0500  PROCALCITON  --   --   --  0.56 2.07 3.87  --   WBC 11.8*  --   --   --  8.4 8.1 7.5  LATICACIDVEN  --  3.2* 1.7  --   --   --   --     Liver Function Tests: Recent Labs  Lab 05/21/18 1515 05/21/18 2000 05/22/18 0441 05/24/18 0500 05/25/18 0444 05/26/18 0420  AST 30 93*  --   --   --   --   ALT 20 38  --   --   --   --   ALKPHOS 54 56  --   --   --   --   BILITOT 0.3 0.6  --   --   --   --   PROT 7.4 6.7  --   --   --   --   ALBUMIN 2.9* 2.5* 2.3* 2.2* 2.3* 2.1*   No  results for input(s): LIPASE, AMYLASE in the last 168 hours. No results for input(s): AMMONIA in the last 168 hours.  ABG    Component Value Date/Time   PHART 7.337 (L) 05/22/2018 1217   PCO2ART 47.6 05/22/2018 1217   PO2ART 98.0 05/22/2018 1217   HCO3 25.6 05/22/2018 1217   TCO2 27 05/22/2018 1217   ACIDBASEDEF 1.0 05/22/2018 1217   O2SAT 97.0 05/22/2018 1217     Coagulation Profile: No results for input(s): INR, PROTIME in the last 168 hours.  Cardiac Enzymes: Recent Labs  Lab 05/21/18 1515 05/22/18 1156  CKTOTAL  --  129  TROPONINI <0.03  --     CBG: Recent Labs  Lab 05/25/18 1531 05/25/18 1917 05/25/18 2351 05/26/18 0341 05/26/18 0739  GLUCAP 187* 167* 168* 171* 136*    This patient is critically ill with multiple organ system failure; which, requires frequent high complexity decision making, assessment, support, evaluation, and titration of therapies. This was completed through the application of advanced monitoring technologies and extensive interpretation of multiple databases. During this encounter critical care time was devoted to patient care services described in this note for 37 minutes.   Garner Nash, DO [facility] 05/26/2018 11:15 AM  Personal pager: [phone] If unanswered, please page CCM On-call: [phone]

## 2018-05-26 NOTE — Progress Notes (Signed)
Video bronchoscopy performed.  Intervention bronchial brushing. Intervention bronchial biopsy. Intervention bronchial washing. Intervention bronchial lavage. Intervention needle biopsy.  Patient tolerated well.  No complications noted.

## 2018-05-26 NOTE — Progress Notes (Signed)
Per Dr Margaretann Loveless  patient will have TEE done bedside. Jobe Igo, RN

## 2018-05-26 NOTE — Progress Notes (Signed)
[facility] for Infectious Disease  Date of Admission:  05/21/2018     Total days of antibiotics 5   Vanco + Ampicillin sulbactam           Patient ID: [name] is a 63 y.o. female with  Active Problems:   Respiratory arrest ([place])   Acute renal failure (ARF) ([place])   Aortic valve mass   . arformoterol  15 mcg Nebulization BID  . budesonide (PULMICORT) nebulizer solution  0.5 mg Nebulization BID  . chlorhexidine gluconate (MEDLINE KIT)  15 mL Mouth Rinse BID  . famotidine  20 mg Per Tube Daily  . feeding supplement (PRO-STAT SUGAR FREE 64)  30 mL Per Tube BID  . heparin  5,000 Units Subcutaneous Q8H  . hydrALAZINE  25 mg Per Tube Q8H  . insulin aspart  2-6 Units Subcutaneous Q4H  . ipratropium-albuterol  3 mL Nebulization Q6H  . mouth rinse  15 mL Mouth Rinse 10 times per day  . metoprolol tartrate  12.5 mg Oral Q8H  . metoprolol tartrate        SUBJECTIVE: Intubated - interval history noted for bronchoscopy and TEE today. She has continued to have a temperature 100 - 100.6 F.   Review of Systems: Review of Systems  Unable to perform ROS: Intubated   No Known Allergies  OBJECTIVE: Vitals:   05/26/18 1515 05/26/18 1555 05/26/18 1556 05/26/18 1609  BP: 131/65     Pulse: (!) 115     Resp: (!) 22     Temp: 100 F (37.8 C)     TempSrc:      SpO2: 100% 98% 98% 100%  Weight:      Height:       Body mass index is 48.1 kg/m.  Lab Results Lab Results  Component Value Date   WBC 7.5 05/24/2018   HGB 7.7 (L) 05/24/2018   HCT 25.9 (L) 05/24/2018   MCV 93.5 05/24/2018   PLT 218 05/24/2018    Lab Results  Component Value Date   CREATININE 3.76 (H) 05/26/2018   BUN 36 (H) 05/26/2018   NA 135 05/26/2018   K 4.4 05/26/2018   CL 99 05/26/2018   CO2 26 05/26/2018    Lab Results  Component Value Date   ALT 38 05/21/2018   AST 93 (H) 05/21/2018   ALKPHOS 56 05/21/2018   BILITOT 0.6 05/21/2018     Microbiology: Recent Results (from the  past 240 hour(s))  Blood culture (routine x 2)     Status: None   Collection Time: 05/21/18  4:36 PM  Result Value Ref Range Status   Specimen Description BLOOD LEFT HAND  Final   Special Requests   Final    BOTTLES DRAWN AEROBIC AND ANAEROBIC Blood Culture adequate volume   Culture   Final    NO GROWTH 5 DAYS Performed at [facility], [address] [place], [place] [address]    Report Status 05/26/2018 FINAL  Final  Blood culture (routine x 2)     Status: None   Collection Time: 05/21/18  4:36 PM  Result Value Ref Range Status   Specimen Description BLOOD LEFT ARM  Final   Special Requests   Final    BOTTLES DRAWN AEROBIC ONLY Blood Culture adequate volume   Culture   Final    NO GROWTH 5 DAYS Performed at [facility], [address], [place], [place] [address]    Report Status  05/26/2018 FINAL  Final  MRSA PCR Screening     Status: None   Collection Time: 05/21/18 11:32 PM  Result Value Ref Range Status   MRSA by PCR NEGATIVE NEGATIVE Final    Comment:        The GeneXpert MRSA Assay (FDA approved for NASAL specimens only), is one component of a comprehensive MRSA colonization surveillance program. It is not intended to diagnose MRSA infection nor to guide or monitor treatment for MRSA infections. Performed at [facility], [place] [address] [place], [place] [address]   Culture, blood (routine x 2)     Status: None (Preliminary result)   Collection Time: 05/24/18  1:04 PM  Result Value Ref Range Status   Specimen Description BLOOD RIGHT ANTECUBITAL  Final   Special Requests AEROBIC BOTTLE ONLY Blood Culture adequate volume  Final   Culture   Final    NO GROWTH 2 DAYS Performed at [facility], [place] [address], [place], [place] [address]    Report Status PENDING  Incomplete  Culture, blood (routine x 2)     Status: None (Preliminary result)   Collection Time: 05/24/18  1:08 PM  Result Value Ref Range Status   Specimen Description BLOOD BLOOD RIGHT HAND   Final   Special Requests AEROBIC BOTTLE ONLY Blood Culture adequate volume  Final   Culture   Final    NO GROWTH 2 DAYS Performed at [facility], [place] [address] [place], [place] [address]    Report Status PENDING  Incomplete  Culture, bal-quantitative     Status: None (Preliminary result)   Collection Time: 05/26/18 12:17 PM  Result Value Ref Range Status   Specimen Description BRONCHIAL ALVEOLAR LAVAGE  Final   Special Requests LLL  Final   Gram Stain   Final    ABUNDANT WBC PRESENT, PREDOMINANTLY PMN NO ORGANISMS SEEN Performed at [facility], [place] [address], [place],  [address]    Culture PENDING  Incomplete   Report Status PENDING  Incomplete   ASSESSMENT & PLAN:  63 y.o. female admitted with CAP 2/17 - 2/20 with discharge to SNF. Readmitted 2/22 with increased respiratory distress in the setting of wide complex tachycardia, AKI, hyperkalemia which required intubation. TTE during work up revealed possible vegetation and ID asked to weigh in for CN endocarditis.   1. AV Mass = TEE characterizes AV lesion to be "calcified, heterogeneous, primarily immobile lesion of the left coronary cusp on the ventricular aspect - ?subacute or healed vegetation vs calcified nodule in the setting of chronic dialysis. Considering her negative cultures would suspect this is sterile process and non-infectious. Will stop her vancomycin with negative nasal colonization on PCR.   2. Respiratory Failure, Endobronchial Tumor = endobronchial tumor present with near total occlusion of lateral and posterior segment of the left lower lobe with visible tumor along the anterior medial wall of the left mainstem. Suspect this is contributing to #3. Continue ampicillin-sulbactam for now to cover possible aspiration event; her strep pneumo urine antigen is positive however in the context of recent treatment for CAP vs biologic false positive I am not sure of the significance of this.   Please call  back with questions or should her condition change. Thank you.   Janene Madeira, MSN, NP-C [facility] for Infectious [place] Cell: [phone] Pager: [phone]  05/26/2018  4:12 PM

## 2018-05-26 NOTE — Progress Notes (Signed)
  Echocardiogram Echocardiogram Transesophageal has been performed.  [name] 05/26/2018, 11:42 AM

## 2018-05-26 NOTE — Interval H&P Note (Signed)
History and Physical Interval Note:  05/26/2018 10:01 AM  [name]  has presented today for surgery, with the diagnosis of aortic vavle veg  The various methods of treatment have been discussed with the patient and family. After consideration of risks, benefits and other options for treatment, the patient has consented to  Procedure(s): TRANSESOPHAGEAL ECHOCARDIOGRAM (TEE) (N/A) as a surgical intervention .  The patient's history has been reviewed, patient examined, no change in status, stable for surgery.  I have reviewed the patient's chart and labs.  Questions were answered to the patient's satisfaction.     Elouise Munroe

## 2018-05-27 LAB — RENAL FUNCTION PANEL
Albumin: 2.2 g/dL — ABNORMAL LOW (ref 3.5–5.0)
Anion gap: 10 (ref 5–15)
BUN: 55 mg/dL — ABNORMAL HIGH (ref 8–23)
CO2: 26 mmol/L (ref 22–32)
Calcium: 8.2 mg/dL — ABNORMAL LOW (ref 8.9–10.3)
Chloride: 101 mmol/L (ref 98–111)
Creatinine, Ser: 5.11 mg/dL — ABNORMAL HIGH (ref 0.44–1.00)
GFR calc Af Amer: 10 mL/min — ABNORMAL LOW (ref >60)
GFR calc non Af Amer: 8 mL/min — ABNORMAL LOW (ref >60)
Glucose, Bld: 163 mg/dL — ABNORMAL HIGH (ref 70–99)
Phosphorus: 6.7 mg/dL — ABNORMAL HIGH (ref 2.5–4.6)
Potassium: 4.4 mmol/L (ref 3.5–5.1)
Sodium: 137 mmol/L (ref 135–145)

## 2018-05-27 LAB — BODY FLUID CELL COUNT WITH DIFFERENTIAL
Lymphs, Fluid: 2 %
Monocyte-Macrophage-Serous Fluid: 2 % — ABNORMAL LOW (ref 50–90)
Neutrophil Count, Fluid: 96 % — ABNORMAL HIGH (ref 0–25)
Total Nucleated Cell Count, Fluid: 640 cu mm (ref 0–1000)

## 2018-05-27 LAB — CBC
HCT: 24.6 % — ABNORMAL LOW (ref 36.0–46.0)
Hemoglobin: 7.5 g/dL — ABNORMAL LOW (ref 12.0–15.0)
MCH: 28.2 pg (ref 26.0–34.0)
MCHC: 30.5 g/dL (ref 30.0–36.0)
MCV: 92.5 fL (ref 80.0–100.0)
Platelets: 194 10*3/uL (ref 150–400)
RBC: 2.66 MIL/uL — ABNORMAL LOW (ref 3.87–5.11)
RDW: 14.3 % (ref 11.5–15.5)
WBC: 7.2 10*3/uL (ref 4.0–10.5)
nRBC: 0 % (ref 0.0–0.2)

## 2018-05-27 LAB — GLUCOSE, CAPILLARY
Glucose-Capillary: 130 mg/dL — ABNORMAL HIGH (ref 70–99)
Glucose-Capillary: 154 mg/dL — ABNORMAL HIGH (ref 70–99)
Glucose-Capillary: 124 mg/dL — ABNORMAL HIGH (ref 70–99)
Glucose-Capillary: 140 mg/dL — ABNORMAL HIGH (ref 70–99)
Glucose-Capillary: 127 mg/dL — ABNORMAL HIGH (ref 70–99)
Glucose-Capillary: 102 mg/dL — ABNORMAL HIGH (ref 70–99)

## 2018-05-27 LAB — BARTONELLA ANTIBODY PANEL
B Quintana IgM: NEGATIVE titer
B henselae IgG: NEGATIVE titer
B henselae IgM: NEGATIVE titer
B quintana IgG: NEGATIVE titer

## 2018-05-27 MED ORDER — SODIUM CHLORIDE 0.9 % IV SOLN
3.0000 g | Freq: Two times a day (BID) | INTRAVENOUS | Status: DC
  Administered 2018-05-27 – 2018-05-28 (×2): 3 g via INTRAVENOUS
  Filled 2018-05-27 (×3): qty 3

## 2018-05-27 MED ORDER — HEPARIN SODIUM (PORCINE) 1000 UNIT/ML IJ SOLN
INTRAMUSCULAR | Status: AC
  Administered 2018-05-27: 2400 [IU] via INTRAVENOUS_CENTRAL
  Filled 2018-05-27: qty 3

## 2018-05-27 MED ORDER — ORAL CARE MOUTH RINSE
15.0000 mL | Freq: Two times a day (BID) | OROMUCOSAL | Status: DC
  Administered 2018-05-28: 15 mL via OROMUCOSAL

## 2018-05-27 MED ORDER — CHLORHEXIDINE GLUCONATE 0.12 % MT SOLN
15.0000 mL | Freq: Two times a day (BID) | OROMUCOSAL | Status: DC
  Administered 2018-05-28 (×2): 15 mL via OROMUCOSAL
  Filled 2018-05-27 (×2): qty 15

## 2018-05-27 MED ORDER — HEPARIN SODIUM (PORCINE) 1000 UNIT/ML DIALYSIS
20.0000 [IU]/kg | INTRAMUSCULAR | Status: DC | PRN
  Administered 2018-05-27 – 2018-05-30 (×2): 2400 [IU] via INTRAVENOUS_CENTRAL
  Filled 2018-05-27 (×2): qty 3

## 2018-05-27 MED ORDER — METOPROLOL TARTRATE 5 MG/5ML IV SOLN
5.0000 mg | Freq: Three times a day (TID) | INTRAVENOUS | Status: DC
  Administered 2018-05-27 – 2018-05-28 (×2): 5 mg via INTRAVENOUS
  Filled 2018-05-27 (×2): qty 5

## 2018-05-27 MED ORDER — HYDRALAZINE HCL 20 MG/ML IJ SOLN
10.0000 mg | INTRAMUSCULAR | Status: DC | PRN
  Administered 2018-05-28 – 2018-05-31 (×7): 10 mg via INTRAVENOUS
  Filled 2018-05-27 (×7): qty 1

## 2018-05-27 NOTE — Progress Notes (Signed)
Patient ID: [name], female   DOB: 06/26/1955, 63 y.o.   MRN: [id number] S: TEE revealed calcified lesion not consistent with SBE.  Bronch unfortunately revealed a lower lobe lesion suspicious for malignancy. O:BP (!) 177/82   Pulse 75   Temp 99 F (37.2 C)   Resp (!) 22   Ht _0  (1.626 m)   Wt 126.3 kg   SpO2 99%   BMI 47.79 kg/m   Intake/Output Summary (Last 24 hours) at 05/27/2018 1056 Last data filed at 05/27/2018 1000 Gross per 24 hour  Intake 1274.68 ml  Output 153 ml  Net 1121.68 ml   Intake/Output: I/O last 3 completed shifts: In: 1550.1 [I.V.:1170; NG/GT:80; IV Piggyback:300.1] Out: 237 [Urine:237]  Intake/Output this shift:  Total I/O In: 60.2 [I.V.:60.2] Out: 15 [Urine:15] Weight change: -1.6 kg Gen: obese WF intubated but awake and alert in NAD CVS: no rub Resp: scattered rhonchi bilaterally Abd: obese, +BS, soft, NT Ext: no edema  Recent Labs  Lab 05/21/18 1515  05/21/18 2000  05/22/18 0441 05/22/18 1156 05/22/18 1217 05/23/18 0329 05/24/18 0500 05/25/18 0444 05/26/18 0420 05/27/18 0427  NA 126*   < > 127*   < > 133* 133* 135 133* 135 134* 135 137  K >7.5*   < > 7.5*   < > 5.6* 4.8 4.8 5.6* 4.6 5.2* 4.4 4.4  CL 100  --  101   < > 100 101  --  101 102 100 99 101  CO2 15*  --  19*   < > 22 23  --  _1 GLUCOSE 322*  --  391*   < > 169* 119*  --  139* 174* 163* 192* 163*  BUN 91*  --  89*   < > 64* 49*  --  59* 31* 47* 36* 55*  CREATININE 4.31*  --  4.65*   < > 3.42* 2.91*  --  4.24* 3.47* 4.88* 3.76* 5.11*  ALBUMIN 2.9*  --  2.5*  --  2.3*  --   --   --  2.2* 2.3* 2.1* 2.2*  CALCIUM 8.4*  --  8.1*   < > 7.7* 8.0*  --  7.9* 7.7* 8.3* 7.7* 8.2*  PHOS  --   --   --   --  4.7*  --   --   --  5.4* 7.2* 4.6 6.7*  AST 30  --  93*  --   --   --   --   --   --   --   --   --   ALT 20  --  38  --   --   --   --   --   --   --   --   --    < > = values in this interval not displayed.   Liver Function Tests: Recent Labs  Lab  05/21/18 1515 05/21/18 2000  05/25/18 0444 05/26/18 0420 05/27/18 0427  AST 30 93*  --   --   --   --   ALT 20 38  --   --   --   --   ALKPHOS 54 56  --   --   --   --   BILITOT 0.3 0.6  --   --   --   --   PROT 7.4 6.7  --   --   --   --   ALBUMIN 2.9* 2.5*   < >  2.3* 2.1* 2.2*   < > = values in this interval not displayed.   No results for input(s): LIPASE, AMYLASE in the last 168 hours. No results for input(s): AMMONIA in the last 168 hours. CBC: Recent Labs  Lab 05/21/18 1515  05/22/18 0441  05/23/18 0329 05/24/18 0500 05/27/18 0711  WBC 11.8*  --  8.4  --  8.1 7.5 7.2  NEUTROABS 9.3*  --   --   --   --   --   --   HGB 9.4*   < > 7.4*   < > 6.7* 7.7* 7.5*  HCT 32.4*   < > 24.2*   < > 22.6* 25.9* 24.6*  MCV 95.9  --  93.1  --  94.6 93.5 92.5  PLT 572*  --  321  --  252 218 194   < > = values in this interval not displayed.   Cardiac Enzymes: Recent Labs  Lab 05/21/18 1515 05/22/18 1156  CKTOTAL  --  129  TROPONINI <0.03  --    CBG: Recent Labs  Lab 05/26/18 1115 05/26/18 1524 05/26/18 1927 05/26/18 2342 05/27/18 0347  GLUCAP 133* 120* 135* 154* 130*    Iron Studies: No results for input(s): IRON, TIBC, TRANSFERRIN, FERRITIN in the last 72 hours. Studies/Results: Dg Chest Port 1 View  Result Date: 05/26/2018 CLINICAL DATA:  Post bronchoscopy. EXAM: PORTABLE CHEST 1 VIEW COMPARISON:  05/22/2018. FINDINGS: Right IJ line noted with tip over superior vena cava. NG tube noted with tip below left hemidiaphragm. Cardiomegaly with bilateral pulmonary infiltrates/edema left side greater than right consistent with CHF. Bilateral pneumonia can not be excluded. Findings have progressed from prior exam. Small left pleural effusion. No pneumothorax. IMPRESSION: Lines and tubes in stable position. Congestive heart failure bilateral pulmonary edema left side greater right and left-sided pleural effusion. Findings consistent CHF. Bilateral pneumonia can not be excluded.  Findings have progressed from prior exam. Electronically Signed   By: [place]   On: 05/26/2018 14:02   . arformoterol  15 mcg Nebulization BID  . budesonide (PULMICORT) nebulizer solution  0.5 mg Nebulization BID  . chlorhexidine gluconate (MEDLINE KIT)  15 mL Mouth Rinse BID  . famotidine  20 mg Per Tube Daily  . feeding supplement (PRO-STAT SUGAR FREE 64)  30 mL Per Tube BID  . heparin      . heparin  5,000 Units Subcutaneous Q8H  . hydrALAZINE  25 mg Per Tube Q8H  . insulin aspart  2-6 Units Subcutaneous Q4H  . ipratropium-albuterol  3 mL Nebulization Q6H  . mouth rinse  15 mL Mouth Rinse 10 times per day  . metoprolol tartrate  12.5 mg Oral Q8H    BMET    Component Value Date/Time   NA 137 05/27/2018 0427   K 4.4 05/27/2018 0427   CL 101 05/27/2018 0427   CO2 26 05/27/2018 0427   GLUCOSE 163 (H) 05/27/2018 0427   BUN 55 (H) 05/27/2018 0427   CREATININE 5.11 (H) 05/27/2018 0427   CREATININE 1.50 (H) 02/16/2018 0741   CALCIUM 8.2 (L) 05/27/2018 0427   GFRNONAA 8 (L) 05/27/2018 0427   GFRNONAA 37 (L) 02/16/2018 0741   GFRAA 10 (L) 05/27/2018 0427   GFRAA 43 (L) 02/16/2018 0741   CBC    Component Value Date/Time   WBC 7.2 05/27/2018 0711   RBC 2.66 (L) 05/27/2018 0711   HGB 7.5 (L) 05/27/2018 0711   HCT 24.6 (L) 05/27/2018 0711   PLT 194 05/27/2018  0711   MCV 92.5 05/27/2018 0711   MCH 28.2 05/27/2018 0711   MCHC 30.5 05/27/2018 0711   RDW 14.3 05/27/2018 0711   LYMPHSABS 1.4 05/21/2018 1515   MONOABS 0.6 05/21/2018 1515   EOSABS 0.1 05/21/2018 1515   BASOSABS 0.1 05/21/2018 1515     Assessment/Plan:  1. AKI/CKD stage 3 in setting of sepsis vs cardiorenal syndrome and concomitant ARB therapy. Also with volume overload and low FeNa. Started on CVVHD 05/21/18 due to volume and hyperkalemia. transitioned to IHD on 05/23/18.Remains oliguric and dialysis dependent. 1. Temp HD cath placed 05/21/18 2. Seen on HD today and tolerating it well 3. Continue  with IHD on MWF schedule for now.  4. Remains oliguric 2. Aortic valve vegetation- concern for culture negative vs marantic AV lesion.  1. TEE 05/26/18 revealed calcified immobile lesion that appears to be a subacute or healed vegetation vs calcified nodule (but she is not a chronic HD patient) 2. cont with vanc plus zosyn per ID.  Consider stopping antibiotics given TEE findings. 3. Hyperkalemia- due to #1 and protein supplements.Discussedwith RDwho changed to different formulation of protein supplementation with lesspotassium 1. addedfurosemide IV 2/25/20to see if this will help her K and UOP.  2. K stable 4. Acute on chronic dCHF- now on HD for UF.  Started lasix without much UOP.  5. VDRF- hypoxic and hypercarbic. Mass vs consolidation of LLL. Per PCCM 6. Lung mass - in LLL present in 2014 and appears to have progressed. s/p bronch with biopsies per Dr. Valeta Harms.  Concerning for malignancy.   7. Anemia of CKD- transfuse prn. 8. HTN- was on ARB/hydralazine/lasix pta. On hold for now 9. AMS 10. DM type 2  Donetta Potts, MD [facility] [phone]

## 2018-05-27 NOTE — Procedures (Signed)
Extubation Procedure Note  Patient Details:   Name: [name] DOB: 1955/10/22 MRN: [id number]   Airway Documentation:    Vent end date: 05/27/18 Vent end time: 1545   Evaluation  O2 sats: stable throughout Complications: No apparent complications Patient did tolerate procedure well. Bilateral Breath Sounds: Rhonchi, Diminished   Yes, Placed on 4L [place]  SPO2 98%  Gonzella Lex 05/27/2018, 3:51 PM

## 2018-05-27 NOTE — Progress Notes (Signed)
Nutrition Follow-up  DOCUMENTATION CODES:   Morbid obesity  INTERVENTION:    Continue current TF for now. Vital AF 1.2 at 45 ml/h with Pro-stat 30 ml BID via OGT.  Diet advancement as able after extubation. RD to monitor for adequacy of PO intake.  NUTRITION DIAGNOSIS:   Inadequate oral intake related to inability to eat as evidenced by NPO status.  Ongoing   GOAL:   Provide needs based on ASPEN/SCCM guidelines  Unmet  MONITOR:   Vent status, Labs, I & O's, TF tolerance  ASSESSMENT:   63 yo female with PMH of HTN, DM, anemia, CHF, mental handicap, asthma, arthritis who was admitted from LTC to [facility] with respiratory distress, V Tach; intubated and transferred to [facility] 2/22.  Patient is currently receiving Vital AF 1.2 via OGT at 45 ml/h (1080 ml/day) with Prostat 30 ml BID to provide 1496 kcals, 111 gm protein, 876 ml free water, 1777 mg potassium, 1084 mg phosphorus daily.   TEE 2/27 showed calcified lesion. Bronchoscopy 2/27 showed lower lobe lesion suspicious for malignancy.   Patient is currently intubated on ventilator support. Plans for extubation after dialysis today.   Temp (24hrs), Avg:99.6 F (37.6 C), Min:98.8 F (37.1 C), Max:100.2 F (37.9 C)   Labs reviewed. Phosphorus 6.7 (H), potassium 4.4 WNL CBG's: 154-130 Medications reviewed.   Diet Order:   Diet Order            Diet NPO time specified  Diet effective midnight              EDUCATION NEEDS:   No education needs have been identified at this time  Skin:  Skin Assessment: Reviewed RN Assessment  Last BM:  2/27  Height:   Ht Readings from Last 1 Encounters:  05/21/18 5\' 4"  (1.626 m)    Weight:   Wt Readings from Last 1 Encounters:  05/27/18 126.3 kg    Ideal Body Weight:  54.5 kg  BMI:  Body mass index is 47.79 kg/m.  Estimated Nutritional Needs:   Kcal:  1390-1770  Protein:  110-135 gm  Fluid:  >/= 1.7 L    Molli Barrows, RD, LDN, CNSC Pager  [phone] After Hours Pager [phone]

## 2018-05-27 NOTE — Progress Notes (Signed)
NAME:  [name], MRN:  [id number], DOB:  [birth year], LOS: 6 ADMISSION DATE:  05/21/2018, CONSULTATION DATE:  05/21/2018 REFERRING MD:  Dr. Lacinda Axon, CHIEF COMPLAINT:  Respiratory Distress   History of present illness   63 year old female presents from [place] to [facility] with reported one day of respiratory distress, placed on BiPAP by EMS. On arrival to ED found to be in [place]. Given Amiodarone bolus Cardiology consulted. Preparing for cardioversion, however patient converted into wide complex tachycardia. Intubated, during intubation had notable emesis. Given Vancomycin/Zosyn. K 7.5, Crt 4.31. Administered Bicarb, Calcium, Albuterol, Insulin/Dextrose. ABG 7.15/45. LA 3.2. Transferred to [facility] for further treatment.   Recent admission 2/14-2/17 with CAP. Noted to have AKI which improved. At discharge Cr 1.63. CT Chest concerning for neoplastic process of left lower lung base.  Was treated with a course of Levaquin  Past Medical History  Obesity, HTN, Mental Health Handicap, grade 1 diastolic heart failure, Asthma Underlying obstructive lung disease  Significant Hospital Events   2/22 > Presents to ED   Consults:  PCCM 2/22 ID 2/25 - AoV Veg   Procedures:  ETT 2/22 >>   Significant Diagnostic Tests:  CT Chest 2/14 > Masslike consolidation measuring 5 x 8.2 cm over the left lower lobe  CXR 2/22 > Endotracheal tube just into the right mainstem bronchus. Recommend retracting 2-3 cm. Increasing left perihilar and lower lobe opacity could reflect associated atelectasis related to right mainstem intubation.  ECHO 2/23:   1. The left ventricle has hyperdynamic systolic function, with an ejection fraction of >65%. The cavity size was normal. Left ventricular diastolic Doppler parameters are consistent with impaired relaxation.  2. The right ventricle has normal systolic function. The cavity was normal. There is no increase in right ventricular wall thickness.  3. The mitral valve is  normal in structure.  4. The tricuspid valve is normal in structure.  5. The aortic valve is tricuspid Mild thickening of the aortic valve Mild calcification of the aortic valve.  6. There is a small mobile density (1.1 x 0.5cm) on the ventricular surface of the aortic valve. Challenging to visualize. Consider TEE if clinically warranted.  7. The pulmonic valve was normal in structure.  Micro Data:  Blood 2/22 >> Tracheal Asp 2/22 >> U/A 2/22 > Negative   Antimicrobials:  Cefepime 2/22 >> 224 stop Vancomycin 2/22 >> 2-24 stop Unasyn 224 2020 x 5 days then stop  Interim history/subjective:  05/27/2018: No issues overnight.  Tolerating SBT SAT this morning.  After session of dialysis will plans for liberation from mechanical ventilator.  Objective   Blood pressure (!) 174/86, pulse 73, temperature 99 F (37.2 C), resp. rate (!) 22, height 5\' 4"  (1.626 m), weight 126.3 kg, SpO2 100 %. CVP:  [11 mmHg-13 mmHg] 12 mmHg  Vent Mode: PRVC FiO2 (%):  [40 %] 40 % Set Rate:  [22 bmp] 22 bmp Vt Set:  [430 mL] 430 mL PEEP:  [5 cmH20] 5 cmH20 Plateau Pressure:  [11 cmH20-24 cmH20] 20 cmH20   Intake/Output Summary (Last 24 hours) at 05/27/2018 1040 Last data filed at 05/27/2018 1000 Gross per 24 hour  Intake 1274.68 ml  Output 153 ml  Net 1121.68 ml   Filed Weights   05/26/18 0421 05/27/18 0446 05/27/18 0700  Weight: 127.1 kg 126.4 kg 126.3 kg    Examination: General: Elderly female, intubated, on mechanical ventilation HENT: NCAT, sclera clear, pupils reactive Neck: Endotracheal tube in place Lungs: No crackles, no  wheeze, bilateral ventilated breath sounds Cardiovascular: Regular rate and rhythm, S1-S2, no MRG Abdomen: Soft, nontender, nondistended, bowel sounds present, morbidly obese pannus Extremities: No significant edema Neuro: Alert, oriented, following commands, no focal deficit GU: Foley in place  Resolved Hospital Problem list     Assessment & Plan:   Acute  hypoxic/hypercarbic respiratory failure Concerning for postobstructive pneumonia, Left lower lobe lung mass with endobronchial lesion visible on bronchoscopy -Biopsies pending, visualization under bronchoscopy concerning for neoplasm History of COPD -Patient remains on mechanical ventilatory support. -Will make plans to withdraw from mechanical support today and liberate her from the ventilator as she has been doing well and SBT SAT. -We will follow-up on final pathology reports  Cardiac vegetation, presumed calcification  - routine follow up, unlikely IE   Junctional Rhythm with prolonged QTC in setting of hyperkalemia  Episode of V tach Decompensated Heart Failure   Grade 1 Diastolic Heart Failure H/O HTN Plan   -HD per nephrology. -We appreciate nephrology's recommendations -After HD session today we will plan for liberation from mechanical ventilator   Acute on Chronic Kidney Disease with Hyperkalemia  Metabolic acidosis Lactic Acidosis   Plan  -Initially temporized with bicarb, insulin and calcium -Renal ultrasound no hydronephrosis, no mass, adrenal adenoma 3.6 x 3.6 x 2.3 -Stable we will continue to follow  Hyperglycemia  DM Plan  SSI  Chronic Anemia  Plan -Continue DVT prophylaxis  History of obstructive sleep apnea from a sleep study from 09/17/2017 -Once extubated will need BiPAP QHS at bedtime 18/12  Best practice:  Diet: NPO Pain/Anxiety/Delirium protocol  VAP protocol  DVT prophylaxis: Heparin SQ GI prophylaxis: Pepcid  Glucose control: SSI  Mobility: Bedrest  Code Status: Full Code  Family Communication: [place]  Disposition: Continue ICU care   Labs   CBC: Recent Labs  Lab 05/21/18 1515  05/22/18 0441 05/22/18 1217 05/23/18 0329 05/24/18 0500 05/27/18 0711  WBC 11.8*  --  8.4  --  8.1 7.5 7.2  NEUTROABS 9.3*  --   --   --   --   --   --   HGB 9.4*   < > 7.4* 8.2* 6.7* 7.7* 7.5*  HCT 32.4*   < > 24.2* 24.0* 22.6* 25.9* 24.6*  MCV  95.9  --  93.1  --  94.6 93.5 92.5  PLT 572*  --  321  --  252 218 194   < > = values in this interval not displayed.    Basic Metabolic Panel: Recent Labs  Lab 05/22/18 0441  05/23/18 0329 05/24/18 0500 05/25/18 0444 05/26/18 0420 05/27/18 0427  NA 133*   < > 133* 135 134* 135 137  K 5.6*   < > 5.6* 4.6 5.2* 4.4 4.4  CL 100   < > 101 102 100 99 101  CO2 22   < > 24 27 23 26 26   GLUCOSE 169*   < > 139* 174* 163* 192* 163*  BUN 64*   < > 59* 31* 47* 36* 55*  CREATININE 3.42*   < > 4.24* 3.47* 4.88* 3.76* 5.11*  CALCIUM 7.7*   < > 7.9* 7.7* 8.3* 7.7* 8.2*  MG 2.3  --  2.4  --   --   --   --   PHOS 4.7*  --   --  5.4* 7.2* 4.6 6.7*   < > = values in this interval not displayed.   GFR: Estimated Creatinine Clearance: 15 mL/min (A) (by C-G formula based on  SCr of 5.11 mg/dL (H)). Recent Labs  Lab 05/21/18 1544 05/21/18 1840 05/21/18 2000 05/22/18 0441 05/23/18 0329 05/24/18 0500 05/27/18 0711  PROCALCITON  --   --  0.56 2.07 3.87  --   --   WBC  --   --   --  8.4 8.1 7.5 7.2  LATICACIDVEN 3.2* 1.7  --   --   --   --   --     Liver Function Tests: Recent Labs  Lab 05/21/18 1515 05/21/18 2000 05/22/18 0441 05/24/18 0500 05/25/18 0444 05/26/18 0420 05/27/18 0427  AST 30 93*  --   --   --   --   --   ALT 20 38  --   --   --   --   --   ALKPHOS 54 56  --   --   --   --   --   BILITOT 0.3 0.6  --   --   --   --   --   PROT 7.4 6.7  --   --   --   --   --   ALBUMIN 2.9* 2.5* 2.3* 2.2* 2.3* 2.1* 2.2*   No results for input(s): LIPASE, AMYLASE in the last 168 hours. No results for input(s): AMMONIA in the last 168 hours.  ABG    Component Value Date/Time   PHART 7.337 (L) 05/22/2018 1217   PCO2ART 47.6 05/22/2018 1217   PO2ART 98.0 05/22/2018 1217   HCO3 25.6 05/22/2018 1217   TCO2 27 05/22/2018 1217   ACIDBASEDEF 1.0 05/22/2018 1217   O2SAT 97.0 05/22/2018 1217     Coagulation Profile: No results for input(s): INR, PROTIME in the last 168  hours.  Cardiac Enzymes: Recent Labs  Lab 05/21/18 1515 05/22/18 1156  CKTOTAL  --  129  TROPONINI <0.03  --     CBG: Recent Labs  Lab 05/26/18 1115 05/26/18 1524 05/26/18 1927 05/26/18 2342 05/27/18 0347  GLUCAP 133* 120* 135* 154* 130*   This patient is critically ill with multiple organ system failure; which, requires frequent high complexity decision making, assessment, support, evaluation, and titration of therapies. This was completed through the application of advanced monitoring technologies and extensive interpretation of multiple databases. During this encounter critical care time was devoted to patient care services described in this note for 35 minutes.   Garner Nash, DO [facility] 05/27/2018 10:40 AM  Personal pager: [phone] If unanswered, please page CCM On-call: [phone]

## 2018-05-28 DIAGNOSIS — R042 Hemoptysis: Secondary | ICD-10-CM

## 2018-05-28 LAB — GLUCOSE, CAPILLARY
Glucose-Capillary: 105 mg/dL — ABNORMAL HIGH (ref 70–99)
Glucose-Capillary: 112 mg/dL — ABNORMAL HIGH (ref 70–99)
Glucose-Capillary: 113 mg/dL — ABNORMAL HIGH (ref 70–99)
Glucose-Capillary: 117 mg/dL — ABNORMAL HIGH (ref 70–99)
Glucose-Capillary: 162 mg/dL — ABNORMAL HIGH (ref 70–99)
Glucose-Capillary: 165 mg/dL — ABNORMAL HIGH (ref 70–99)
Glucose-Capillary: 166 mg/dL — ABNORMAL HIGH (ref 70–99)

## 2018-05-28 LAB — CULTURE, BAL-QUANTITATIVE W GRAM STAIN: Culture: 30000 — AB

## 2018-05-28 LAB — RENAL FUNCTION PANEL
Albumin: 2.2 g/dL — ABNORMAL LOW (ref 3.5–5.0)
Anion gap: 11 (ref 5–15)
BUN: 30 mg/dL — ABNORMAL HIGH (ref 8–23)
CO2: 27 mmol/L (ref 22–32)
Calcium: 8.1 mg/dL — ABNORMAL LOW (ref 8.9–10.3)
Chloride: 99 mmol/L (ref 98–111)
Creatinine, Ser: 3.44 mg/dL — ABNORMAL HIGH (ref 0.44–1.00)
GFR calc Af Amer: 16 mL/min — ABNORMAL LOW (ref >60)
GFR calc non Af Amer: 14 mL/min — ABNORMAL LOW (ref >60)
Glucose, Bld: 119 mg/dL — ABNORMAL HIGH (ref 70–99)
Phosphorus: 6.3 mg/dL — ABNORMAL HIGH (ref 2.5–4.6)
Potassium: 3.8 mmol/L (ref 3.5–5.1)
Sodium: 137 mmol/L (ref 135–145)

## 2018-05-28 MED ORDER — AMOXICILLIN-POT CLAVULANATE 500-125 MG PO TABS
1.0000 | ORAL_TABLET | Freq: Every day | ORAL | Status: AC
  Administered 2018-05-28 – 2018-05-29 (×2): 500 mg via ORAL
  Filled 2018-05-28 (×2): qty 1

## 2018-05-28 MED ORDER — METOPROLOL SUCCINATE ER 50 MG PO TB24
50.0000 mg | ORAL_TABLET | Freq: Every day | ORAL | Status: DC
  Administered 2018-05-28 – 2018-05-29 (×2): 50 mg via ORAL
  Filled 2018-05-28 (×2): qty 1

## 2018-05-28 MED ORDER — IPRATROPIUM-ALBUTEROL 0.5-2.5 (3) MG/3ML IN SOLN
3.0000 mL | Freq: Three times a day (TID) | RESPIRATORY_TRACT | Status: DC
  Administered 2018-05-28 – 2018-06-05 (×23): 3 mL via RESPIRATORY_TRACT
  Filled 2018-05-28 (×26): qty 3

## 2018-05-28 NOTE — Progress Notes (Signed)
Patient ID: [name], female   DOB: 06-24-1955, 63 y.o.   MRN: [id number] S: Extubated yesterday and is doing well O:BP (!) 170/87   Pulse 86   Temp 99.1 F (37.3 C)   Resp (!) 24   Ht 5\' 4"  (1.626 m)   Wt 124.9 kg   SpO2 97%   BMI 47.26 kg/m   Intake/Output Summary (Last 24 hours) at 05/28/2018 0957 Last data filed at 05/28/2018 5056 Gross per 24 hour  Intake 666.09 ml  Output 2600 ml  Net -1933.91 ml   Intake/Output: I/O last 3 completed shifts: In: 1004.6 [I.V.:704.5; IV Piggyback:300.1] Out: 2695 [Urine:195; Other:2500]  Intake/Output this shift:  Total I/O In: 49.4 [I.V.:49.4] Out: -  Weight change: -2.9 kg Gen: obese WF sitting up in bed CVS: no rub, rrr Resp: scattered rhonchi Abd: obese, +BS, soft, NT Ext: no edema  Recent Labs  Lab 05/21/18 1515  05/21/18 2000  05/22/18 0441 05/22/18 1156 05/22/18 1217 05/23/18 0329 05/24/18 0500 05/25/18 0444 05/26/18 0420 05/27/18 0427 05/28/18 0416  NA 126*   < > 127*   < > 133* 133* 135 133* 135 134* 135 137 137  K >7.5*   < > 7.5*   < > 5.6* 4.8 4.8 5.6* 4.6 5.2* 4.4 4.4 3.8  CL 100  --  101   < > 100 101  --  101 102 100 99 101 99  CO2 15*  --  19*   < > 22 23  --  24 27 23 26 26 27   GLUCOSE 322*  --  391*   < > 169* 119*  --  139* 174* 163* 192* 163* 119*  BUN 91*  --  89*   < > 64* 49*  --  59* 31* 47* 36* 55* 30*  CREATININE 4.31*  --  4.65*   < > 3.42* 2.91*  --  4.24* 3.47* 4.88* 3.76* 5.11* 3.44*  ALBUMIN 2.9*  --  2.5*  --  2.3*  --   --   --  2.2* 2.3* 2.1* 2.2* 2.2*  CALCIUM 8.4*  --  8.1*   < > 7.7* 8.0*  --  7.9* 7.7* 8.3* 7.7* 8.2* 8.1*  PHOS  --   --   --   --  4.7*  --   --   --  5.4* 7.2* 4.6 6.7* 6.3*  AST 30  --  93*  --   --   --   --   --   --   --   --   --   --   ALT 20  --  38  --   --   --   --   --   --   --   --   --   --    < > = values in this interval not displayed.   Liver Function Tests: Recent Labs  Lab 05/21/18 1515 05/21/18 2000  05/26/18 0420 05/27/18 0427  05/28/18 0416  AST 30 93*  --   --   --   --   ALT 20 38  --   --   --   --   ALKPHOS 54 56  --   --   --   --   BILITOT 0.3 0.6  --   --   --   --   PROT 7.4 6.7  --   --   --   --   ALBUMIN 2.9* 2.5*   < >  2.1* 2.2* 2.2*   < > = values in this interval not displayed.   No results for input(s): LIPASE, AMYLASE in the last 168 hours. No results for input(s): AMMONIA in the last 168 hours. CBC: Recent Labs  Lab 05/21/18 1515  05/22/18 0441  05/23/18 0329 05/24/18 0500 05/27/18 0711  WBC 11.8*  --  8.4  --  8.1 7.5 7.2  NEUTROABS 9.3*  --   --   --   --   --   --   HGB 9.4*   < > 7.4*   < > 6.7* 7.7* 7.5*  HCT 32.4*   < > 24.2*   < > 22.6* 25.9* 24.6*  MCV 95.9  --  93.1  --  94.6 93.5 92.5  PLT 572*  --  321  --  252 218 194   < > = values in this interval not displayed.   Cardiac Enzymes: Recent Labs  Lab 05/21/18 1515 05/22/18 1156  CKTOTAL  --  129  TROPONINI <0.03  --    CBG: Recent Labs  Lab 05/27/18 1518 05/27/18 1950 05/27/18 2325 05/28/18 0357 05/28/18 0748  GLUCAP 140* 127* 102* 105* 113*    Iron Studies: No results for input(s): IRON, TIBC, TRANSFERRIN, FERRITIN in the last 72 hours. Studies/Results: Dg Chest Port 1 View  Result Date: 05/26/2018 CLINICAL DATA:  Post bronchoscopy. EXAM: PORTABLE CHEST 1 VIEW COMPARISON:  05/22/2018. FINDINGS: Right IJ line noted with tip over superior vena cava. NG tube noted with tip below left hemidiaphragm. Cardiomegaly with bilateral pulmonary infiltrates/edema left side greater than right consistent with CHF. Bilateral pneumonia can not be excluded. Findings have progressed from prior exam. Small left pleural effusion. No pneumothorax. IMPRESSION: Lines and tubes in stable position. Congestive heart failure bilateral pulmonary edema left side greater right and left-sided pleural effusion. Findings consistent CHF. Bilateral pneumonia can not be excluded. Findings have progressed from prior exam. Electronically Signed    By: Pike   On: 05/26/2018 14:02   . amoxicillin-clavulanate  1 tablet Oral Daily  . arformoterol  15 mcg Nebulization BID  . budesonide (PULMICORT) nebulizer solution  0.5 mg Nebulization BID  . chlorhexidine  15 mL Mouth Rinse BID  . feeding supplement (PRO-STAT SUGAR FREE 64)  30 mL Per Tube BID  . heparin  5,000 Units Subcutaneous Q8H  . insulin aspart  2-6 Units Subcutaneous Q4H  . ipratropium-albuterol  3 mL Nebulization TID  . mouth rinse  15 mL Mouth Rinse q12n4p  . metoprolol succinate  50 mg Oral Daily    BMET    Component Value Date/Time   NA 137 05/28/2018 0416   K 3.8 05/28/2018 0416   CL 99 05/28/2018 0416   CO2 27 05/28/2018 0416   GLUCOSE 119 (H) 05/28/2018 0416   BUN 30 (H) 05/28/2018 0416   CREATININE 3.44 (H) 05/28/2018 0416   CREATININE 1.50 (H) 02/16/2018 0741   CALCIUM 8.1 (L) 05/28/2018 0416   GFRNONAA 14 (L) 05/28/2018 0416   GFRNONAA 37 (L) 02/16/2018 0741   GFRAA 16 (L) 05/28/2018 0416   GFRAA 43 (L) 02/16/2018 0741   CBC    Component Value Date/Time   WBC 7.2 05/27/2018 0711   RBC 2.66 (L) 05/27/2018 0711   HGB 7.5 (L) 05/27/2018 0711   HCT 24.6 (L) 05/27/2018 0711   PLT 194 05/27/2018 0711   MCV 92.5 05/27/2018 0711   MCH 28.2 05/27/2018 0711   MCHC 30.5 05/27/2018 0711   RDW 14.3  05/27/2018 0711   LYMPHSABS 1.4 05/21/2018 1515   MONOABS 0.6 05/21/2018 1515   EOSABS 0.1 05/21/2018 1515   BASOSABS 0.1 05/21/2018 1515     Assessment/Plan:  1. AKI/CKD stage 3 in setting of sepsis vs cardiorenal syndrome and concomitant ARB therapy. Also with volume overload and low FeNa. Started on CVVHD 05/21/18 due to volume and hyperkalemia. transitioned to IHD on 05/23/18.Remains oliguric and dialysis dependent. 1. Temp HD cath placed 05/21/18 2. Seen on HD today and tolerating it well 3. Continue with IHD on MWF schedule for now. 4. Remains oliguric and dialysis dependent, so will ask IR to place tunneled HD catheter on Monday or  Tuesday. 2. Aortic valve vegetation- concern for culture negative vs marantic AV lesion. 1. TEE 05/26/18 revealed calcified immobile lesion that appears to be a subacute or healed vegetation vs calcified nodule (but she is not a chronic HD patient) 2. Off of vanc/zosyn given TEE finding and now on augmentin for presumed post-obstructive pna. 3. Hyperkalemia- due to #1 and protein supplements.Discussedwith RDwho changed to different formulation of protein supplementation with lesspotassium 1. addedfurosemide IV 2/25/20to see if this will help her K and UOP. 2. K stable with new supplements 4. Acute on chronic dCHF- now on HD for UF. Started lasix without much UOP.  5. VDRF- hypoxic and hypercarbic. Mass vs consolidation of LLL. Per PCCM 6. Lung mass - in LLL present in 2014 and appears to have progressed. s/p bronch with biopsies per Dr. Valeta Harms.  Concerning for malignancy.   7. Anemia of CKD- transfuse prn. 8. HTN- was on ARB/hydralazine/lasix pta. On hold for now 9. AMS 10. DM type 2   Donetta Potts, MD [facility] [phone]

## 2018-05-28 NOTE — Progress Notes (Addendum)
NAME:  [name], MRN:  [id number], DOB:  [birth year], LOS: 7 ADMISSION DATE:  05/21/2018, CONSULTATION DATE:  05/21/2018 REFERRING MD:  Dr. Lacinda Axon, CHIEF COMPLAINT:  Respiratory Distress   History of present illness   63 year old female presents from  [facility] to [facility] with reported one day of respiratory distress, placed on BiPAP by EMS. On arrival to ED found to be in [place]. Given Amiodarone bolus Cardiology consulted. Preparing for cardioversion, however patient converted into wide complex tachycardia. Intubated, during intubation had notable emesis. Given Vancomycin/Zosyn. K 7.5, Crt 4.31. Administered Bicarb, Calcium, Albuterol, Insulin/Dextrose. ABG 7.15/45. LA 3.2. Transferred to [facility] for further treatment.   Recent admission 2/14-2/17 with CAP. Noted to have AKI which improved. At discharge Cr 1.63. CT Chest concerning for neoplastic process of left lower lung base. Was treated with a course of Levaquin   Past Medical History  Obesity, HTN, Mental Health Handicap, grade 1 diastolic heart failure, Asthma Underlying obstructive lung disease  Significant Hospital Events   2/22 > Presents to ED   Consults:  PCCM 2/22 ID 2/25 - AoV Veg   Procedures:  ETT 2/22 >>   Significant Diagnostic Tests:  CT Chest 2/14 > Masslike consolidation measuring 5 x 8.2 cm over the left lower lobe  CXR 2/22 > Endotracheal tube just into the right mainstem bronchus. Recommend retracting 2-3 cm. Increasing left perihilar and lower lobe opacity could reflect associated atelectasis related to right mainstem intubation.  ECHO 2/23:   1. The left ventricle has hyperdynamic systolic function, with an ejection fraction of >65%. The cavity size was normal. Left ventricular diastolic Doppler parameters are consistent with impaired relaxation.  2. The right ventricle has normal systolic function. The cavity was normal. There is no increase in right ventricular wall thickness.  3. The mitral valve is  normal in structure.  4. The tricuspid valve is normal in structure.  5. The aortic valve is tricuspid Mild thickening of the aortic valve Mild calcification of the aortic valve.  6. There is a small mobile density (1.1 x 0.5cm) on the ventricular surface of the aortic valve. Challenging to visualize. Consider TEE if clinically warranted.  7. The pulmonic valve was normal in structure.  Micro Data:  Blood 2/22 >> Tracheal Asp 2/22 >> U/A 2/22 > Negative   Antimicrobials:  Cefepime 2/22 >> 224 stop Vancomycin 2/22 >> 2-24 stop Unasyn 224 2020 x 5 days then stop  Interim history/subjective:  05/28/2018: No issues overnight.  Did well post extubation.  Advancing diet today.  Up in chair.  Objective   Blood pressure (!) 170/87, pulse 86, temperature 99.1 F (37.3 C), resp. rate (!) 24, height 5\' 4"  (1.626 m), weight 124.9 kg, SpO2 97 %. CVP:  [11 mmHg-20 mmHg] 11 mmHg  Vent Mode: CPAP;PSV FiO2 (%):  [40 %] 40 % Set Rate:  [22 bmp] 22 bmp Vt Set:  [430 mL] 430 mL PEEP:  [5 cmH20] 5 cmH20 Pressure Support:  [8 cmH20] 8 cmH20 Plateau Pressure:  [13 cmH20] 13 cmH20   Intake/Output Summary (Last 24 hours) at 05/28/2018 0939 Last data filed at 05/28/2018 0800 Gross per 24 hour  Intake 636.71 ml  Output 2600 ml  Net -1963.29 ml   Filed Weights   05/27/18 0700 05/27/18 1125 05/28/18 0416  Weight: 126.3 kg 123.5 kg 124.9 kg    Examination: General: Elderly female, intubated, on mechanical ventilation HENT: NCAT, sclera clear, pupils reactive Neck: Endotracheal tube in place Lungs: No crackles,  no wheeze, bilateral ventilated breath sounds Cardiovascular: Regular rate and rhythm, S1-S2, no MRG Abdomen: Soft, nontender, nondistended, bowel sounds present, morbidly obese pannus Extremities: No significant edema Neuro: Alert, oriented, following commands, no focal deficit GU: Foley in place  Resolved Hospital Problem list     Assessment & Plan:   Acute hypoxic/hypercarbic  respiratory failure Concerning for postobstructive pneumonia, Left lower lobe lung mass with endobronchial lesion visible on bronchoscopy -Biopsies pending, visualization under bronchoscopy concerning for neoplasm Hemoptysis  History of COPD - Patient doing well post extubation from ventilator. -Continue antibiotics -Will await final pathology results from bronchoscopy.  Cardiac vegetation, presumed calcification  -Continued follow-up unlikely i.e. presumed aortic valve calcification  Junctional Rhythm with prolonged QTC in setting of hyperkalemia  Episode of V tach Decompensated Heart Failure   Grade 1 Diastolic Heart Failure H/O HTN Plan   -HD per nephrology   Acute on Chronic Kidney Disease with Hyperkalemia  Metabolic acidosis Lactic Acidosis   Plan  -Initially temporized with bicarb, insulin and calcium -Renal ultrasound no hydronephrosis, no mass, adrenal adenoma 3.6 x 3.6 x 2.3 -Stable we will continue to follow  Hyperglycemia  DM Plan  -SSI  Chronic Anemia  Plan -Continue DVT prophylaxis  History of obstructive sleep apnea from a sleep study from 09/17/2017 -Once extubated will need BiPAP QHS at bedtime 18/12  Atrial fibrillation with RVR -Secondary to bronchoscopy -Only lasted for a few minutes post bronchoscopy and then patient self converted.. -If she has repeat episodes of A. fib will need to consider anticoagulation needs.  Nutrition: -We will advance diet as tolerated  Case discussed with Dr. Thereasa Solo from the [facility] who is agreed to take over patient's care as of May 29, 2018.  Best practice:  Diet: NPO Pain/Anxiety/Delirium protocol  VAP protocol  DVT prophylaxis: Heparin SQ GI prophylaxis: Pepcid  Glucose control: SSI  Mobility: Bedrest  Code Status: Full Code  Family Communication: [place]  Disposition: Stable for transfer from the ICU  Labs    CBC: Recent Labs  Lab 05/21/18 1515  05/22/18 0441  05/22/18 1217 05/23/18 0329 05/24/18 0500 05/27/18 0711  WBC 11.8*  --  8.4  --  8.1 7.5 7.2  NEUTROABS 9.3*  --   --   --   --   --   --   HGB 9.4*   < > 7.4* 8.2* 6.7* 7.7* 7.5*  HCT 32.4*   < > 24.2* 24.0* 22.6* 25.9* 24.6*  MCV 95.9  --  93.1  --  94.6 93.5 92.5  PLT 572*  --  321  --  252 218 194   < > = values in this interval not displayed.    Basic Metabolic Panel: Recent Labs  Lab 05/22/18 0441  05/23/18 0329 05/24/18 0500 05/25/18 0444 05/26/18 0420 05/27/18 0427 05/28/18 0416  NA 133*   < > 133* 135 134* 135 137 137  K 5.6*   < > 5.6* 4.6 5.2* 4.4 4.4 3.8  CL 100   < > 101 102 100 99 101 99  CO2 22   < > 24 27 23 26 26 27   GLUCOSE 169*   < > 139* 174* 163* 192* 163* 119*  BUN 64*   < > 59* 31* 47* 36* 55* 30*  CREATININE 3.42*   < > 4.24* 3.47* 4.88* 3.76* 5.11* 3.44*  CALCIUM 7.7*   < > 7.9* 7.7* 8.3* 7.7* 8.2* 8.1*  MG 2.3  --  2.4  --   --   --   --   --  PHOS 4.7*  --   --  5.4* 7.2* 4.6 6.7* 6.3*   < > = values in this interval not displayed.   GFR: Estimated Creatinine Clearance: 22.2 mL/min (A) (by C-G formula based on SCr of 3.44 mg/dL (H)). Recent Labs  Lab 05/21/18 1544 05/21/18 1840 05/21/18 2000 05/22/18 0441 05/23/18 0329 05/24/18 0500 05/27/18 0711  PROCALCITON  --   --  0.56 2.07 3.87  --   --   WBC  --   --   --  8.4 8.1 7.5 7.2  LATICACIDVEN 3.2* 1.7  --   --   --   --   --     Liver Function Tests: Recent Labs  Lab 05/21/18 1515 05/21/18 2000  05/24/18 0500 05/25/18 0444 05/26/18 0420 05/27/18 0427 05/28/18 0416  AST 30 93*  --   --   --   --   --   --   ALT 20 38  --   --   --   --   --   --   ALKPHOS 54 56  --   --   --   --   --   --   BILITOT 0.3 0.6  --   --   --   --   --   --   PROT 7.4 6.7  --   --   --   --   --   --   ALBUMIN 2.9* 2.5*   < > 2.2* 2.3* 2.1* 2.2* 2.2*   < > = values in this interval not displayed.   No results for input(s): LIPASE, AMYLASE in the last 168 hours. No results for input(s):  AMMONIA in the last 168 hours.  ABG    Component Value Date/Time   PHART 7.337 (L) 05/22/2018 1217   PCO2ART 47.6 05/22/2018 1217   PO2ART 98.0 05/22/2018 1217   HCO3 25.6 05/22/2018 1217   TCO2 27 05/22/2018 1217   ACIDBASEDEF 1.0 05/22/2018 1217   O2SAT 97.0 05/22/2018 1217     Coagulation Profile: No results for input(s): INR, PROTIME in the last 168 hours.  Cardiac Enzymes: Recent Labs  Lab 05/21/18 1515 05/22/18 1156  CKTOTAL  --  129  TROPONINI <0.03  --     CBG: Recent Labs  Lab 05/27/18 1518 05/27/18 1950 05/27/18 2325 05/28/18 0357 05/28/18 0748  GLUCAP 140* 127* 102* 105* 113*    Garner Nash, DO [facility] 05/28/2018 9:39 AM  Personal pager: [phone] If unanswered, please page CCM On-call: [phone]

## 2018-05-29 DIAGNOSIS — Z794 Long term (current) use of insulin: Secondary | ICD-10-CM

## 2018-05-29 DIAGNOSIS — J189 Pneumonia, unspecified organism: Secondary | ICD-10-CM

## 2018-05-29 DIAGNOSIS — D3A090 Benign carcinoid tumor of the bronchus and lung: Secondary | ICD-10-CM

## 2018-05-29 DIAGNOSIS — E1169 Type 2 diabetes mellitus with other specified complication: Secondary | ICD-10-CM

## 2018-05-29 HISTORY — DX: Benign carcinoid tumor of the bronchus and lung: D3A.090

## 2018-05-29 LAB — GLUCOSE, CAPILLARY
Glucose-Capillary: 106 mg/dL — ABNORMAL HIGH (ref 70–99)
Glucose-Capillary: 124 mg/dL — ABNORMAL HIGH (ref 70–99)
Glucose-Capillary: 146 mg/dL — ABNORMAL HIGH (ref 70–99)
Glucose-Capillary: 102 mg/dL — ABNORMAL HIGH (ref 70–99)
Glucose-Capillary: 135 mg/dL — ABNORMAL HIGH (ref 70–99)
Glucose-Capillary: 157 mg/dL — ABNORMAL HIGH (ref 70–99)

## 2018-05-29 LAB — CULTURE, BLOOD (ROUTINE X 2)
Culture: NO GROWTH
Culture: NO GROWTH
Special Requests: ADEQUATE
Special Requests: ADEQUATE

## 2018-05-29 LAB — RENAL FUNCTION PANEL
Albumin: 2.4 g/dL — ABNORMAL LOW (ref 3.5–5.0)
Anion gap: 12 (ref 5–15)
BUN: 48 mg/dL — ABNORMAL HIGH (ref 8–23)
CO2: 26 mmol/L (ref 22–32)
Calcium: 8.4 mg/dL — ABNORMAL LOW (ref 8.9–10.3)
Chloride: 100 mmol/L (ref 98–111)
Creatinine, Ser: 4.65 mg/dL — ABNORMAL HIGH (ref 0.44–1.00)
GFR calc Af Amer: 11 mL/min — ABNORMAL LOW (ref >60)
GFR calc non Af Amer: 9 mL/min — ABNORMAL LOW (ref >60)
Glucose, Bld: 119 mg/dL — ABNORMAL HIGH (ref 70–99)
Phosphorus: 7.9 mg/dL — ABNORMAL HIGH (ref 2.5–4.6)
Potassium: 4.2 mmol/L (ref 3.5–5.1)
Sodium: 138 mmol/L (ref 135–145)

## 2018-05-29 MED ORDER — DILTIAZEM HCL 25 MG/5ML IV SOLN
10.0000 mg | Freq: Once | INTRAVENOUS | Status: AC
  Administered 2018-05-29: 10 mg via INTRAVENOUS
  Filled 2018-05-29: qty 5

## 2018-05-29 MED ORDER — INSULIN ASPART 100 UNIT/ML ~~LOC~~ SOLN
0.0000 [IU] | Freq: Three times a day (TID) | SUBCUTANEOUS | Status: DC
  Administered 2018-05-29 – 2018-05-30 (×3): 1 [IU] via SUBCUTANEOUS
  Administered 2018-05-31: 2 [IU] via SUBCUTANEOUS
  Administered 2018-06-01: 7 [IU] via SUBCUTANEOUS
  Administered 2018-06-02: 2 [IU] via SUBCUTANEOUS
  Administered 2018-06-02 (×2): 1 [IU] via SUBCUTANEOUS
  Administered 2018-06-03: 2 [IU] via SUBCUTANEOUS
  Administered 2018-06-04: 1 [IU] via SUBCUTANEOUS
  Administered 2018-06-04 (×2): 3 [IU] via SUBCUTANEOUS
  Administered 2018-06-05 – 2018-06-06 (×5): 2 [IU] via SUBCUTANEOUS
  Administered 2018-06-07: 5 [IU] via SUBCUTANEOUS
  Administered 2018-06-07: 2 [IU] via SUBCUTANEOUS
  Administered 2018-06-08: 7 [IU] via SUBCUTANEOUS
  Administered 2018-06-08: 2 [IU] via SUBCUTANEOUS

## 2018-05-29 MED ORDER — METOPROLOL SUCCINATE ER 100 MG PO TB24
100.0000 mg | ORAL_TABLET | Freq: Every day | ORAL | Status: DC
  Administered 2018-05-30 – 2018-06-09 (×11): 100 mg via ORAL
  Filled 2018-05-29 (×12): qty 1

## 2018-05-29 MED ORDER — INSULIN ASPART 100 UNIT/ML ~~LOC~~ SOLN
0.0000 [IU] | Freq: Every day | SUBCUTANEOUS | Status: DC
  Administered 2018-06-02 – 2018-06-06 (×3): 2 [IU] via SUBCUTANEOUS
  Administered 2018-06-07: 3 [IU] via SUBCUTANEOUS

## 2018-05-29 MED ORDER — ISOSORBIDE DINITRATE 10 MG PO TABS
10.0000 mg | ORAL_TABLET | Freq: Three times a day (TID) | ORAL | Status: DC
  Administered 2018-05-29 – 2018-06-09 (×33): 10 mg via ORAL
  Filled 2018-05-29 (×33): qty 1

## 2018-05-29 MED ORDER — DILTIAZEM LOAD VIA INFUSION
10.0000 mg | Freq: Once | INTRAVENOUS | Status: AC
  Administered 2018-05-29: 10 mg via INTRAVENOUS
  Filled 2018-05-29: qty 10

## 2018-05-29 MED ORDER — DILTIAZEM HCL-DEXTROSE 100-5 MG/100ML-% IV SOLN (PREMIX)
5.0000 mg/h | INTRAVENOUS | Status: DC
  Administered 2018-05-29: 5 mg/h via INTRAVENOUS
  Administered 2018-05-30 (×2): 15 mg/h via INTRAVENOUS
  Filled 2018-05-29 (×5): qty 100

## 2018-05-29 NOTE — Evaluation (Signed)
Physical Therapy Evaluation Patient Details Name: [name] MRN: [id number] DOB: Mar 19, 1956 Today's Date: 05/29/2018   History of Present Illness  [name] is a 63 y.o. female with hx of DM, HTN, cognitive delay, who was recently hospitalized for CAP from 2/17-2/20 and discharged back to SNF. She was readmitted on 2/22 for increased respiratory distress in the setting of having wide complex tachycardia, AKI, hyperkalemia and mild lactic acidosis requiring intubation. TTE found her to have new AV vegetation .PNT PMH: HTN,DM, MENTAL HANDICAPPED.   Clinical Impression   Pt admitted with above diagnosis. Pt currently with functional limitations due to the deficits listed below (see PT Problem List). Pt tells [place] she consistently walked with RW to dining hall with RW, and did not require supplemental O2 prior to this admission; Presents with generalized weakness, decr functional mobility, decr activity tolerance; Desatted with gentle physical activity on Room Air; Noted she is new to HD -- if she will continue HD as an Outpt at [facility], recommend we get in the habit of going to HD in the recliner;  Pt will benefit from skilled PT to increase their independence and safety with mobility to allow discharge to the venue listed below.       Follow Up Recommendations SNF(Unclear if she is already at SNF, or if she is at ALF currently)    Equipment Recommendations  Rolling walker with 5" wheels;3in1 (PT)    Recommendations for Other Services       Precautions / Restrictions Precautions Precautions: Fall Precaution Comments: Watch O2 sats Restrictions Weight Bearing Restrictions: No      Mobility  Bed Mobility                  Transfers Overall transfer level: Needs assistance Equipment used: Rolling walker (2 wheeled) Transfers: Sit to/from Stand Sit to Stand: Mod assist         General transfer comment: Light mod assist to power up to stand; cues for hand position and  safety; noted weakness and decr control with descent to sit; tending to brace backs of LEs against chair with standing, indicative of incr fall risk  Ambulation/Gait Ambulation/Gait assistance: Min assist Gait Distance (Feet): (March in place) Assistive device: Rolling walker (2 wheeled)       General Gait Details: Heavy dependence on RW for support; Pt politely declined any ambulation (too tired); short step height and subjective report of feeling weak  Stairs            Wheelchair Mobility    Modified Rankin (Stroke Patients Only)       Balance Overall balance assessment: Needs assistance           Standing balance-Leahy Scale: Poor                               Pertinent Vitals/Pain Pain Assessment: No/denies pain    Home Living Family/patient expects to be discharged to:: Skilled nursing facility                 Additional Comments: It is not entirely clear to me if she lived at a SNF or at an ALF    Prior Function Level of Independence: Needs assistance   Gait / Transfers Assistance Needed: Walks to and from dining hall with RW per pt report  ADL's / Homemaking Assistance Needed: PATINET REPORTS SHE HAD HELP WITH ADLS AND SHE WOULD ASSIT.  Hand Dominance   Dominant Hand: Left    Extremity/Trunk Assessment   Upper Extremity Assessment Upper Extremity Assessment: Defer to OT evaluation    Lower Extremity Assessment Lower Extremity Assessment: Generalized weakness       Communication   Communication: No difficulties  Cognition Arousal/Alertness: Awake/alert Behavior During Therapy: WFL for tasks assessed/performed Overall Cognitive Status: History of cognitive impairments - at baseline                                        General Comments General comments (skin integrity, edema, etc.): Pt reported she is not usually on supplemental O2 (this will need to be verified); Opted to monitor O2 sats with  gentle activity on Room Air,a nd she did desat to 85%, incr back to 92 or above with supplemental O2, 3L    Exercises     Assessment/Plan    PT Assessment Patient needs continued PT services  PT Problem List Decreased strength;Decreased range of motion;Decreased activity tolerance;Decreased balance;Decreased mobility;Decreased coordination;Decreased cognition;Decreased knowledge of use of DME;Decreased safety awareness;Decreased knowledge of precautions;Cardiopulmonary status limiting activity       PT Treatment Interventions DME instruction;Gait training;Functional mobility training;Therapeutic activities;Therapeutic exercise;Balance training;Neuromuscular re-education;Cognitive remediation;Patient/family education    PT Goals (Current goals can be found in the Care Plan section)  Acute Rehab PT Goals Patient Stated Goal: Asked if we can walk tomorrow; agreeable to PT/OT today PT Goal Formulation: With patient Time For Goal Achievement: 06/12/18 Potential to Achieve Goals: Good    Frequency Min 2X/week   Barriers to discharge        Co-evaluation PT/OT/SLP Co-Evaluation/Treatment: Yes Reason for Co-Treatment: Complexity of the patient's impairments (multi-system involvement) PT goals addressed during session: Mobility/safety with mobility OT goals addressed during session: ADL's and self-care       AM-PAC PT "6 Clicks" Mobility  Outcome Measure Help needed turning from your back to your side while in a flat bed without using bedrails?: A Little Help needed moving from lying on your back to sitting on the side of a flat bed without using bedrails?: A Lot Help needed moving to and from a bed to a chair (including a wheelchair)?: A Little Help needed standing up from a chair using your arms (e.g., wheelchair or bedside chair)?: A Little Help needed to walk in hospital room?: A Little Help needed climbing 3-5 steps with a railing? : A Lot 6 Click Score: 16    End of Session  Equipment Utilized During Treatment: Gait belt;Oxygen Activity Tolerance: Patient limited by fatigue Patient left: in chair;with call bell/phone within reach;with chair alarm set Nurse Communication: Mobility status PT Visit Diagnosis: Unsteadiness on feet (R26.81);Other abnormalities of gait and mobility (R26.89);Muscle weakness (generalized) (M62.81)    Time: 8185-6314 PT Time Calculation (min) (ACUTE ONLY): 23 min   Charges:   PT Evaluation $PT Eval Moderate Complexity: 1 Mod          Roney [place], [place]  Acute Rehabilitation Services Pager [phone] Office [phone]   [place]ta [place] 05/29/2018, 11:40 AM

## 2018-05-29 NOTE — Progress Notes (Signed)
Patient ID: [name], female   DOB: 08/08/1955, 63 y.o.   MRN: [id number] S: No new complaints but does have some orthopnea O:BP (!) 159/81   Pulse 92   Temp 98.1 F (36.7 C) (Oral)   Resp 20   Ht 5\' 4"  (1.626 m)   Wt 122.5 kg   SpO2 97%   BMI 46.36 kg/m   Intake/Output Summary (Last 24 hours) at 05/29/2018 1154 Last data filed at 05/29/2018 1014 Gross per 24 hour  Intake 100 ml  Output 5 ml  Net 95 ml   Intake/Output: I/O last 3 completed shifts: In: 377 [I.V.:276.9; IV Piggyback:100.1] Out: 100 [Urine:100]  Intake/Output this shift:  Total I/O In: 100 [P.O.:100] Out: -  Weight change: 0.5 kg Gen: obese WF in NAD CVS: no rub Resp: occ rhonchi Abd: obese Ext: trace pretibial edema  Recent Labs  Lab 05/23/18 0329 05/24/18 0500 05/25/18 0444 05/26/18 0420 05/27/18 0427 05/28/18 0416 05/29/18 0234  NA 133* 135 134* 135 137 137 138  K 5.6* 4.6 5.2* 4.4 4.4 3.8 4.2  CL 101 102 100 99 101 99 100  CO2 24 27 23 26 26 27 26   GLUCOSE 139* 174* 163* 192* 163* 119* 119*  BUN 59* 31* 47* 36* 55* 30* 48*  CREATININE 4.24* 3.47* 4.88* 3.76* 5.11* 3.44* 4.65*  ALBUMIN  --  2.2* 2.3* 2.1* 2.2* 2.2* 2.4*  CALCIUM 7.9* 7.7* 8.3* 7.7* 8.2* 8.1* 8.4*  PHOS  --  5.4* 7.2* 4.6 6.7* 6.3* 7.9*   Liver Function Tests: Recent Labs  Lab 05/27/18 0427 05/28/18 0416 05/29/18 0234  ALBUMIN 2.2* 2.2* 2.4*   No results for input(s): LIPASE, AMYLASE in the last 168 hours. No results for input(s): AMMONIA in the last 168 hours. CBC: Recent Labs  Lab 05/23/18 0329 05/24/18 0500 05/27/18 0711  WBC 8.1 7.5 7.2  HGB 6.7* 7.7* 7.5*  HCT 22.6* 25.9* 24.6*  MCV 94.6 93.5 92.5  PLT 252 218 194   Cardiac Enzymes: Recent Labs  Lab 05/22/18 1156  CKTOTAL 129   CBG: Recent Labs  Lab 05/28/18 1650 05/28/18 2001 05/28/18 2358 05/29/18 0405 05/29/18 0838  GLUCAP 165* 166* 117* 106* 124*    Iron Studies: No results for input(s): IRON, TIBC, TRANSFERRIN, FERRITIN in the  last 72 hours. Studies/Results: No results found. Marland Kitchen arformoterol  15 mcg Nebulization BID  . budesonide (PULMICORT) nebulizer solution  0.5 mg Nebulization BID  . feeding supplement (PRO-STAT SUGAR FREE 64)  30 mL Per Tube BID  . heparin  5,000 Units Subcutaneous Q8H  . insulin aspart  0-5 Units Subcutaneous QHS  . insulin aspart  0-9 Units Subcutaneous TID WC  . ipratropium-albuterol  3 mL Nebulization TID  . metoprolol succinate  50 mg Oral Daily    BMET    Component Value Date/Time   NA 138 05/29/2018 0234   K 4.2 05/29/2018 0234   CL 100 05/29/2018 0234   CO2 26 05/29/2018 0234   GLUCOSE 119 (H) 05/29/2018 0234   BUN 48 (H) 05/29/2018 0234   CREATININE 4.65 (H) 05/29/2018 0234   CREATININE 1.50 (H) 02/16/2018 0741   CALCIUM 8.4 (L) 05/29/2018 0234   GFRNONAA 9 (L) 05/29/2018 0234   GFRNONAA 37 (L) 02/16/2018 0741   GFRAA 11 (L) 05/29/2018 0234   GFRAA 43 (L) 02/16/2018 0741   CBC    Component Value Date/Time   WBC 7.2 05/27/2018 0711   RBC 2.66 (L) 05/27/2018 0711   HGB 7.5 (L) 05/27/2018  0711   HCT 24.6 (L) 05/27/2018 0711   PLT 194 05/27/2018 0711   MCV 92.5 05/27/2018 0711   MCH 28.2 05/27/2018 0711   MCHC 30.5 05/27/2018 0711   RDW 14.3 05/27/2018 0711   LYMPHSABS 1.4 05/21/2018 1515   MONOABS 0.6 05/21/2018 1515   EOSABS 0.1 05/21/2018 1515   BASOSABS 0.1 05/21/2018 1515     Assessment/Plan:  1. AKI/CKD stage 3 in setting of sepsis vs cardiorenal syndrome and concomitant ARB therapy. Also with volume overload and low FeNa. Started on CVVHD 05/21/18 due to volume and hyperkalemia. transitioned to IHD on 05/23/18.Remains oliguric and dialysis dependent. 1. Temp HD cath placed 05/21/18 2. Continue with IHD on MWF schedule for now. 3. Remains oliguric and dialysis dependent, so will ask IR to place tunneled HD catheter on Monday or Tuesday as it will need to be replaced soon. 2. Aortic valve vegetation- concern for culture negative vs marantic AV  lesion. 1. TEE2/27/20 revealed calcified immobile lesion that appears to be a subacute or healed vegetation vs calcified nodule (but she is not a chronic HD patient) 2. Off of vanc/zosyn given TEE finding and now on augmentin for presumed post-obstructive pna. 3. Hyperkalemia- due to #1 and protein supplements.Discussedwith RDwho changed to different formulation of protein supplementation with lesspotassium 1. addedfurosemide IV 2/25/20to see if this will help her K and UOP. 2. K stable with new supplements 4. Acute on chronic dCHF- now on HD for UF. Started lasix without much UOP.  5. VDRF- hypoxic and hypercarbic. Mass vs consolidation of LLL. Per PCCM 6. Lung mass - in LLL present in 2014 and appears to have progressed. s/p bronch with biopsies per Dr. Valeta Harms. Concerning for malignancy.  7. Anemia of CKD- transfuse prn. 8. HTN- was on ARB/hydralazine/lasix pta. On hold for now 9. AMS 10. DM type 2   Donetta Potts, MD [facility] [phone]

## 2018-05-29 NOTE — Progress Notes (Addendum)
RN called because patient in RAF rate 140s. Patient sitting in the chair, comfortable no SOB no CP 12 lead EKG done  (AF) RN spoke with MD initially given 5mg  Labetalol IV. BP 144/97  AF 130-160s  RR 16  O2 sat 97% on 4L [place] Rn notified MD, will come to bedside Awaiting orders.  1900 noted new order for Cardizem 10mg  IVP.  Bedside RN comfortable giving medication.  1950  Spoke with Celso RN 10mg  Cardizem given.  HR now 114-130 AF.  Recommended calling on call provider for additional orders.  RN to call if assistance needed.

## 2018-05-29 NOTE — Progress Notes (Signed)
Pt transferred form 6N for AFIB RVR. HR on arrival 158. Pt is alert and oriented, VS Stable(see flow sheet) Cardizem gtt initiated along with Cardizem 10mg  bolus per orders. Will continue to monitor. Jessie Foot, RN

## 2018-05-29 NOTE — Progress Notes (Signed)
Patient just transferred to 6E29 after report was given to the receiving RN.

## 2018-05-29 NOTE — Progress Notes (Signed)
Patient still on Afib rhythm with irregular HR 114-131.  On- call Traid MD was made aware and informed of Dr. Sabino Niemann earlier notes to transfer patient to SDU if no changes of the Afib situation after the admin of cardizem.  [facility] also called this RN and to inform them if a transfer is ordered.  Awaiting response.

## 2018-05-29 NOTE — Progress Notes (Signed)
Hospitalist Follow up Note  Called re: new tachycardia.  HR 140s-160s.  Irregular, narrow complex.  Labetalol PRN availabel for HTN was given, transient improvement.  BP 144/90, asymptomatic.  Ptient comfortable, no complaints.      EKG personally reviewed, Afib, no ST changes, narrow complex, irregular.    Stable recurrent Afib.  -Give diltiazem 10 mg IV now -If no response in 15 minutes, will repeat dose and start infusion, transfer to SDU -Signout given to cross coverage -Will need to discuss anticoagulation with family tomorrow

## 2018-05-29 NOTE — Progress Notes (Signed)
Physical Therapy Note  SATURATION QUALIFICATIONS: (This note is used to comply with regulatory documentation for home oxygen)  Patient Saturations on Room Air at Rest = 94%  Patient Saturations on Room Air while Marching in place = 85%  Patient Saturations on 3 Liters of oxygen  = 95%  Please briefly explain why patient needs home oxygen: Patient requires supplemental oxygen to maintain oxygen saturations at acceptable, safe levels with physical activity.  Roney Marion, [place]  Acute Rehabilitation Services Pager [phone] Office [phone]

## 2018-05-29 NOTE — Evaluation (Signed)
Occupational Therapy Evaluation Patient Details Name: [name] MRN: [id number] DOB: 1955-11-25 Today's Date: 05/29/2018    History of Present Illness [name] is a 63 y.o. female with hx of DM, HTN, cognitive delay, who was recently hospitalized for CAP from 2/17-2/20 and discharged back to SNF. She was readmitted on 2/22 for increased respiratory distress in the setting of having wide complex tachycardia, AKI, hyperkalemia and mild lactic acidosis requiring intubation. TTE found her to have new AV vegetation .PNT PMH: HTN,DM, MENTAL HANDICAPPED.    Clinical Impression   PATIENT WAS SEEN FOR SKILLED OT TO INCREASE I AND SAFETY WITH ADLS AND MOBILITY. PATIENT WILL BE FOLLOWED BY ACUTE OT TO MAXIMIZE FUNCTION AND I WITH ABILITY TO CARE FOR HERSELF.     Follow Up Recommendations  SNF    Equipment Recommendations       Recommendations for Other Services       Precautions / Restrictions Precautions Precautions: Fall Precaution Comments: Watch O2 sats Restrictions Weight Bearing Restrictions: No      Mobility Bed Mobility                  Transfers Overall transfer level: Needs assistance Equipment used: Rolling walker (2 wheeled) Transfers: Sit to/from Stand Sit to Stand: Mod assist         General transfer comment: Light mod assist to power up to stand; cues for hand position and safety; noted weakness and decr control with descent to sit; tending to brace backs of LEs against chair with standing, indicative of incr fall risk    Balance Overall balance assessment: Needs assistance           Standing balance-Leahy Scale: Poor                             ADL either performed or assessed with clinical judgement   ADL Overall ADL's : Needs assistance/impaired Eating/Feeding: Independent   Grooming: Wash/dry hands;Wash/dry face;Set up   Upper Body Bathing: Supervision/ safety;Set up;Sitting   Lower Body Bathing: Moderate  assistance;Sit to/from stand   Upper Body Dressing : Minimal assistance;Sitting   Lower Body Dressing: Maximal assistance;Sit to/from stand   Toilet Transfer: Moderate assistance;+2 for physical assistance   Toileting- Clothing Manipulation and Hygiene: Maximal assistance               Vision Baseline Vision/History: Wears glasses Wears Glasses: At all times       Perception     Praxis      Pertinent Vitals/Pain Pain Assessment: No/denies pain     Hand Dominance Left   Extremity/Trunk Assessment Upper Extremity Assessment Upper Extremity Assessment: Defer to OT evaluation   Lower Extremity Assessment Lower Extremity Assessment: Generalized weakness       Communication Communication Communication: No difficulties   Cognition Arousal/Alertness: Awake/alert Behavior During Therapy: WFL for tasks assessed/performed Overall Cognitive Status: History of cognitive impairments - at baseline                                     General Comments  Pt reported she is not usually on supplemental O2 (this will need to be verified); Opted to monitor O2 sats with gentle activity on Room Air,a nd she did desat to 85%, incr back to 92 or above with supplemental O2, 3L    Exercises     Shoulder  Instructions      Home Living Family/patient expects to be discharged to:: Skilled nursing facility                                 Additional Comments: It is not entirely clear to me if she lived at a SNF or at an ALF      Prior Functioning/Environment Level of [place]: Needs assistance  Gait / Transfers Assistance Needed: Walks to and from dining hall with RW per pt report ADL's / Homemaking Assistance Needed: PATINET REPORTS SHE HAD HELP WITH ADLS AND SHE WOULD ASSIT.            OT Problem List: Decreased activity tolerance;Decreased knowledge of use of DME or AE      OT Treatment/Interventions: Self-care/ADL training;DME and/or AE  instruction;Therapeutic activities;Patient/family education    OT Goals(Current goals can be found in the care plan section) Acute Rehab OT Goals Patient Stated Goal: Asked if we can walk tomorrow; agreeable to PT/OT today OT Goal Formulation: With patient Time For Goal Achievement: 06/12/18 Potential to Achieve Goals: Good  OT Frequency: Min 2X/week   Barriers to D/C:            Co-evaluation PT/OT/SLP Co-Evaluation/Treatment: Yes Reason for Co-Treatment: Complexity of the patient's impairments (multi-system involvement) PT goals addressed during session: Mobility/safety with mobility OT goals addressed during session: ADL's and self-care      AM-PAC OT "6 Clicks" Daily Activity     Outcome Measure Help from another person eating meals?: None Help from another person taking care of personal grooming?: A Little Help from another person toileting, which includes using toliet, bedpan, or urinal?: A Lot Help from another person bathing (including washing, rinsing, drying)?: A Lot Help from another person to put on and taking off regular upper body clothing?: A Little Help from another person to put on and taking off regular lower body clothing?: A Lot 6 Click Score: 16   End of Session Equipment Utilized During Treatment: Gait belt;Rolling walker Nurse Communication: (OK THERAPY)  Activity Tolerance: Patient tolerated treatment well Patient left: in chair;with call bell/phone within reach;with chair alarm set  OT Visit Diagnosis: Unsteadiness on feet (R26.81)                Time: 8119-1478 OT Time Calculation (min): 22 min Charges:  OT General Charges $OT Visit: 1 Visit OT Evaluation $OT Eval Low Complexity: 1 Low   6 CLICKS Kebin Maye 05/29/2018, 12:07 PM

## 2018-05-29 NOTE — Progress Notes (Signed)
PROGRESS NOTE    JLA REYNOLDS  WJX:[id number] DOB: [birth year] DOA: 05/21/2018 PCP: Rosita Fire, MD      Brief Narrative:  Mrs. Ramey is a 63 y.o. F with mental disability in LTC, asthma well-controlled, CKD III Baseline Cr 1.3-1.6, HTN, and DM who presented with severe dyspnea requiring BiPAP by EMS.  On arrival in the ER was in V. tach, cardiology consulted and given amiodarone bolus, then developed unstable wide-complex tachycardia and acute respiratory failure.  Creatinine 4.3, potassium 7.5 on admission, pH 7.15, PCO2 45, lactic acid 3.2, chest x-ray pneumonia.  Temporizing potassium measures given, broad-spectrum antibiotics started, intubated and transferred to ICU at [facility].  Extubated 2/28 after 6 days.       Assessment & Plan:   Acute respiratory failure with hypoxia and hypercarbia Multifactorial from ventricular tachycardia, fluid overload from CHF and renal failure, pneumonia, asthma.  Extubated 2/28, stable on O2 by [place]. -Wean O2 as able   Postobstructive pneumonia Left lower lobe lung mass Afebrile, HR normal, RR normal, taking orals, mentation back to baseline, but still requiring supplemental O2. -Continue Augmentin, day 7 of 10 -Follow surg path -Respiratory toilet  Acute renal failure on baseline CKD III Hyperkalemia Metabolic acidosis Lactic acidosis Creatinine 1.3-1.6 at baseline.  Now dialysis dependent.  UOP 84 yesterday. 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  Blood cultures negative x4.  Doubt culture negative endocarditis.  In setting of suspected lung cancer, this is likely marantic.  Acute on chronic diastolic CHF Hypertension Appears volume overloaded, has failed UOP with Lasix.  BP elevated. -Continue aspirin  -Restart home Isordil -Continue metoprolol -Hold home hydralazine, ARB  Ventricular  tachycardia Junctional rhythm in setting of hyperkalemia Resolved -Continue metoprolol  History of asthma -Continue aformoterol and budesonide -Duonebs TID -Hold Trelegy from home, loratadine  Diabetes Glucoses excellent. -Continue sliding scale corrections -Hold home U500  Sleep apnea -BiPAP at night  Paroxysmal atrial fibrillation This occurred in the setting of bronchoscopy.  Lasted only a few minutes post bronchoscopy and then self converted. -Agree with deferring anticoagulation at this time unless recurrence         MDM and disposition: The below labs and imaging reports were reviewed and summarized above.  Medication management as above.  The patient was admitted with respiratory failure from pneumonia VT.  Found to have lung mass.  Biopsied and biospy pending.  Unfortunately, renal function not well recovering. Will need further intermittent dialysis, but per Neph notes, not a long term dialysis candidate.  Will involve Palliative care.         DVT prophylaxis: Heparin Code Status: FULL Family Communication: Guardian by phone    Consultants:   CCM  Cardiology  Nephrology  IR  Procedures:   Intubation 2/22  Bronchoscopy 2/27  TEE 2/27  Extubation 2/28  Antimicrobials:   Vancomycin 2/22 >> 2/26  Cefepime 2/22 >> 2/23  Unasyn/Augmentin 2/24 >>      Subjective: A little dyspneic, no chest pain, fever, confusion.  Cough and sputum are mild.  Leg swelling is mild.  Objective: Vitals:   05/29/18 0527 05/29/18 0857 05/29/18 1439 05/29/18 1512  BP: (!) 159/81   (!) 178/94  Pulse: 91 92 86 88  Resp: _0 Temp: 98.1 F (36.7 C)   98.1 F (36.7 C)  TempSrc: Oral   Oral  SpO2: 99% 97% 98% 96%  Weight:  Height:        Intake/Output Summary (Last 24 hours) at 05/29/2018 1513 Last data filed at 05/29/2018 1330 Gross per 24 hour  Intake 400 ml  Output -  Net 400 ml   Filed Weights   05/28/18 0416 05/28/18 1704  05/29/18 0500  Weight: 124.9 kg 124 kg 122.5 kg    Examination: General appearance: Elderly adult female, alert and in no acute distress.  Sitting in recliner, interactive. HEENT: Anicteric, conjunctiva pink, lids and lashes normal. No nasal deformity, discharge, epistaxis.  Lips moist, dentition normal, oropharynx moist, no oral lesions, hearing normal.   Skin: Warm and dry.  No jaundice.  No suspicious rashes or lesions. Cardiac: RRR, nl S1-S2, no murmurs appreciated.  Capillary refill is brisk.  JVP not visible.  Nonpitting LE edema.  Radia  pulses 2+ and symmetric. Respiratory: Normal respiratory rate and rhythm.  CTAB without rales or wheezes. Abdomen: Abdomen soft.  No TTP. No ascites, distension, hepatosplenomegaly.   MSK: No deformities or effusions. Neuro: Awake and alert.  EOMI, moves all extremities. Speech fluent.    Psych: Sensorium intact and responding to questions, attention normal. Affect normal.  Judgment and insight appear mildly impaired.    Data Reviewed: I have personally reviewed following labs and imaging studies:  CBC: Recent Labs  Lab 05/23/18 0329 05/24/18 0500 05/27/18 0711  WBC 8.1 7.5 7.2  HGB 6.7* 7.7* 7.5*  HCT 22.6* 25.9* 24.6*  MCV 94.6 93.5 92.5  PLT 252 218 448   Basic Metabolic Panel: Recent Labs  Lab 05/23/18 0329  05/25/18 0444 05/26/18 0420 05/27/18 0427 05/28/18 0416 05/29/18 0234  NA 133*   < > 134* 135 137 137 138  K 5.6*   < > 5.2* 4.4 4.4 3.8 4.2  CL 101   < > 100 99 101 99 100  CO2 24   < > _0 GLUCOSE 139*   < > 163* 192* 163* 119* 119*  BUN 59*   < > 47* 36* 55* 30* 48*  CREATININE 4.24*   < > 4.88* 3.76* 5.11* 3.44* 4.65*  CALCIUM 7.9*   < > 8.3* 7.7* 8.2* 8.1* 8.4*  MG 2.4  --   --   --   --   --   --   PHOS  --    < > 7.2* 4.6 6.7* 6.3* 7.9*   < > = values in this interval not displayed.   GFR: Estimated Creatinine Clearance: 16.2 mL/min (A) (by C-G formula based on SCr of 4.65 mg/dL (H)). Liver  Function Tests: Recent Labs  Lab 05/25/18 0444 05/26/18 0420 05/27/18 0427 05/28/18 0416 05/29/18 0234  ALBUMIN 2.3* 2.1* 2.2* 2.2* 2.4*   No results for input(s): LIPASE, AMYLASE in the last 168 hours. No results for input(s): AMMONIA in the last 168 hours. Coagulation Profile: No results for input(s): INR, PROTIME in the last 168 hours. Cardiac Enzymes: No results for input(s): CKTOTAL, CKMB, CKMBINDEX, TROPONINI in the last 168 hours. BNP (last 3 results) No results for input(s): PROBNP in the last 8760 hours. HbA1C: No results for input(s): HGBA1C in the last 72 hours. CBG: Recent Labs  Lab 05/28/18 2001 05/28/18 2358 05/29/18 0405 05/29/18 0838 05/29/18 1220  GLUCAP 166* 117* 106* 124* 146*   Lipid Profile: No results for input(s): CHOL, HDL, LDLCALC, TRIG, CHOLHDL, LDLDIRECT in the last 72 hours. Thyroid Function Tests: No results for input(s): TSH, T4TOTAL, FREET4, T3FREE, THYROIDAB in the last 72 hours. Anemia  Panel: No results for input(s): VITAMINB12, FOLATE, FERRITIN, TIBC, IRON, RETICCTPCT in the last 72 hours. Urine analysis:    Component Value Date/Time   COLORURINE AMBER (A) 05/22/2018 0758   APPEARANCEUR CLOUDY (A) 05/22/2018 0758   LABSPEC 1.025 05/22/2018 0758   PHURINE 5.0 05/22/2018 0758   GLUCOSEU 50 (A) 05/22/2018 0758   HGBUR LARGE (A) 05/22/2018 0758   BILIRUBINUR NEGATIVE 05/22/2018 0758   KETONESUR 5 (A) 05/22/2018 0758   PROTEINUR 100 (A) 05/22/2018 0758   UROBILINOGEN 0.2 02/01/2013 1145   NITRITE NEGATIVE 05/22/2018 0758   LEUKOCYTESUR SMALL (A) 05/22/2018 0758   Sepsis Labs: _0 (procalcitonin:4,lacticacidven:4)  ) Recent Results (from the past 240 hour(s))  Blood culture (routine x 2)     Status: None   Collection Time: 05/21/18  4:36 PM  Result Value Ref Range Status   Specimen Description BLOOD LEFT HAND  Final   Special Requests   Final    BOTTLES DRAWN AEROBIC AND ANAEROBIC Blood Culture adequate volume   Culture    Final    NO GROWTH 5 DAYS Performed at [facility], [address] [place], [place] [address]    Report Status 05/26/2018 FINAL  Final  Blood culture (routine x 2)     Status: None   Collection Time: 05/21/18  4:36 PM  Result Value Ref Range Status   Specimen Description BLOOD LEFT ARM  Final   Special Requests   Final    BOTTLES DRAWN AEROBIC ONLY Blood Culture adequate volume   Culture   Final    NO GROWTH 5 DAYS Performed at [facility], [address], [place], [place] [address]    Report Status 05/26/2018 FINAL  Final  MRSA PCR Screening     Status: None   Collection Time: 05/21/18 11:32 PM  Result Value Ref Range Status   MRSA by PCR NEGATIVE NEGATIVE Final    Comment:        The GeneXpert MRSA Assay (FDA approved for NASAL specimens only), is one component of a comprehensive MRSA colonization surveillance program. It is not intended to diagnose MRSA infection nor to guide or monitor treatment for MRSA infections. Performed at [facility], [place] [address] [place], [place] [address]   Culture, blood (routine x 2)     Status: None   Collection Time: 05/24/18  1:04 PM  Result Value Ref Range Status   Specimen Description BLOOD RIGHT ANTECUBITAL  Final   Special Requests AEROBIC BOTTLE ONLY Blood Culture adequate volume  Final   Culture   Final    NO GROWTH 5 DAYS Performed at [facility], [place] [address], [place], [place] [address]    Report Status 05/29/2018 FINAL  Final  Culture, blood (routine x 2)     Status: None   Collection Time: 05/24/18  1:08 PM  Result Value Ref Range Status   Specimen Description BLOOD BLOOD RIGHT HAND  Final   Special Requests AEROBIC BOTTLE ONLY Blood Culture adequate volume  Final   Culture   Final    NO GROWTH 5 DAYS Performed at [facility], [place] [address] [place], [place] [address]    Report Status 05/29/2018 FINAL  Final  Culture, bal-quantitative     Status: Abnormal   Collection Time: 05/26/18 12:17 PM   Result Value Ref Range Status   Specimen Description BRONCHIAL ALVEOLAR LAVAGE  Final   Special Requests LLL  Final   Gram Stain   Final  ABUNDANT WBC PRESENT, PREDOMINANTLY PMN NO ORGANISMS SEEN    Culture (A)  Final    30,000 COLONIES/mL Consistent with normal respiratory flora. Performed at [facility], [place] [address], [place], [place] [address]    Report Status 05/28/2018 FINAL  Final         Radiology Studies: No results found.      Scheduled Meds: . arformoterol  15 mcg Nebulization BID  . budesonide (PULMICORT) nebulizer solution  0.5 mg Nebulization BID  . feeding supplement (PRO-STAT SUGAR FREE 64)  30 mL Per Tube BID  . heparin  5,000 Units Subcutaneous Q8H  . insulin aspart  0-5 Units Subcutaneous QHS  . insulin aspart  0-9 Units Subcutaneous TID WC  . ipratropium-albuterol  3 mL Nebulization TID  . metoprolol succinate  50 mg Oral Daily   Continuous Infusions: . sodium chloride Stopped (05/23/18 0733)  . feeding supplement (VITAL AF 1.2 CAL) Stopped (05/27/18 1900)     LOS: 8 days    Time spent: 35 minutes    Edwin Dada, MD [facility] 05/29/2018, 3:13 PM     Please page through [place]:  [email or web address] Password [email or web address] If 7PM-7AM, please contact night-coverage

## 2018-05-29 NOTE — Progress Notes (Signed)
Patient in Afib rate between 130-150s. Dr. Loleta Books notified. EKG and Labetalol ordered and given. EKG done Afib rate 139. Will continue to monitor patient.

## 2018-05-30 LAB — PROTIME-INR
INR: 1.7 — ABNORMAL HIGH (ref 0.8–1.2)
Prothrombin Time: 20 seconds — ABNORMAL HIGH (ref 11.4–15.2)

## 2018-05-30 LAB — GLUCOSE, CAPILLARY
Glucose-Capillary: 123 mg/dL — ABNORMAL HIGH (ref 70–99)
Glucose-Capillary: 83 mg/dL (ref 70–99)
Glucose-Capillary: 156 mg/dL — ABNORMAL HIGH (ref 70–99)

## 2018-05-30 LAB — CBC
HCT: 26.2 % — ABNORMAL LOW (ref 36.0–46.0)
Hemoglobin: 7.6 g/dL — ABNORMAL LOW (ref 12.0–15.0)
MCH: 27.4 pg (ref 26.0–34.0)
MCHC: 29 g/dL — ABNORMAL LOW (ref 30.0–36.0)
MCV: 94.6 fL (ref 80.0–100.0)
Platelets: 235 10*3/uL (ref 150–400)
RBC: 2.77 MIL/uL — ABNORMAL LOW (ref 3.87–5.11)
RDW: 13.9 % (ref 11.5–15.5)
WBC: 7.2 10*3/uL (ref 4.0–10.5)
nRBC: 0 % (ref 0.0–0.2)

## 2018-05-30 LAB — RENAL FUNCTION PANEL
Albumin: 2.3 g/dL — ABNORMAL LOW (ref 3.5–5.0)
Anion gap: 11 (ref 5–15)
BUN: 73 mg/dL — ABNORMAL HIGH (ref 8–23)
CO2: 26 mmol/L (ref 22–32)
Calcium: 8.1 mg/dL — ABNORMAL LOW (ref 8.9–10.3)
Chloride: 98 mmol/L (ref 98–111)
Creatinine, Ser: 5.79 mg/dL — ABNORMAL HIGH (ref 0.44–1.00)
GFR calc Af Amer: 8 mL/min — ABNORMAL LOW (ref >60)
GFR calc non Af Amer: 7 mL/min — ABNORMAL LOW (ref >60)
Glucose, Bld: 137 mg/dL — ABNORMAL HIGH (ref 70–99)
Phosphorus: 10.4 mg/dL — ABNORMAL HIGH (ref 2.5–4.6)
Potassium: 4.4 mmol/L (ref 3.5–5.1)
Sodium: 135 mmol/L (ref 135–145)

## 2018-05-30 LAB — CHLAMYDIA PANEL SERUM
C. Pneumoniae IgM Serum: <1:10 {titer}
C. Psittaci IgM Serum: <1:10 {titer}
C. Trachomatis IgM Serum: <1:10 {titer}

## 2018-05-30 MED ORDER — HEPARIN SODIUM (PORCINE) 5000 UNIT/ML IJ SOLN: 5000.0000 [IU] | Freq: Three times a day (TID) | INTRAMUSCULAR | Status: DC

## 2018-05-30 MED ORDER — FERRIC CITRATE 1 GM 210 MG(FE) PO TABS
420.0000 mg | ORAL_TABLET | Freq: Three times a day (TID) | ORAL | Status: DC
  Administered 2018-05-30 – 2018-06-02 (×6): 420 mg via ORAL
  Filled 2018-05-30 (×10): qty 2

## 2018-05-30 MED ORDER — HEPARIN SODIUM (PORCINE) 1000 UNIT/ML IJ SOLN
INTRAMUSCULAR | Status: AC
  Administered 2018-05-30: 2400 [IU] via INTRAVENOUS_CENTRAL
  Filled 2018-05-30: qty 3

## 2018-05-30 MED ORDER — CEFAZOLIN SODIUM-DEXTROSE 2-4 GM/100ML-% IV SOLN
2.0000 g | INTRAVENOUS | Status: AC
  Administered 2018-05-30: 2 g via INTRAVENOUS
  Filled 2018-05-30: qty 100

## 2018-05-30 NOTE — Clinical Social Work Note (Signed)
Clinical Social Work Assessment  Patient Details  Name: [name] MRN: [id number] Date of Birth: 05-21-1955  Date of referral:  05/30/18               Reason for consult:  Facility Placement, Discharge Planning                Permission sought to share information with:  Facility Sport and exercise psychologist, Guardian Permission granted to share information::     Name::     [name]  Agency::  [facility], and SNFs  Relationship::  legal guardian  Contact Information:  [phone]  Housing/Transportation Living arrangements for the past 2 months:  [place] of Information:  Facility, Guardian Patient Interpreter Needed:  None Criminal Activity/Legal Involvement Pertinent to Current Situation/Hospitalization:  No - Comment as needed Significant Relationships:  Adult Children Lives with:  Facility Resident Do you feel safe going back to the place where you live?  Yes Need for family participation in patient care:  Yes (Comment)  Care giving concerns: Patient from [facility] in [place]. PT recommending SNF.    Social Worker assessment / plan: CSW spoke to patient's legal guardian, [name], and to Mudlogger at her ALF, [facility]. CSW introduced self and role and discussed disposition planning.  Director at [place] reported patient's father is at [facility], and is hopeful patient could also go there if recommendation remains for rehab. ALF can accept patient back after she does some rehab. Can also check with ALF pending patient's progress to determine if they can manage patient's needs directly from hospital, or if they will recommend she go to SNF first.  Patient's guardian agreeable to SNF as well. She had questions about patient's condition and plans for dialysis. She received message from MD and will return his call to obtain updates.   Patient's PASRR pending. Will need SNF level PASRR prior to admission to a SNF. Will also need [facility]  authorization. Will also need to clarify plan for dialysis - if candidate for outpatient, will need clip.   CSW will continue to follow for medical readiness and support with disposition planning.  Employment status:  Disabled (Comment on whether or not currently receiving Disability) Insurance information:  Managed Medicare([employer]) PT Recommendations:  [place] / Referral to community resources:  [place]  Patient/Family's Response to care: Guardian appreciative of care.  Patient/Family's Understanding of and Emotional Response to Diagnosis, Current Treatment, and Prognosis: Guardian has questions about patient's condition. Agreeable to SNF.  Emotional Assessment Appearance:  Appears stated age Attitude/Demeanor/Rapport:  Unable to Assess Affect (typically observed):  Unable to Assess Orientation:  Oriented to Self, Oriented to Place, Oriented to  Time, Oriented to Situation Alcohol / Substance use:  Not Applicable Psych involvement (Current and /or in the community):  No (Comment)  Discharge Needs  Concerns to be addressed:  Discharge Planning Concerns, Care Coordination Readmission within the last 30 days:  Yes Current discharge risk:  Physical Impairment, Cognitively Impaired Barriers to Discharge:  Continued Medical Work up, Ship broker, Waiting for outpatient dialysis   Estanislado Emms, LCSW 05/30/2018, 4:56 PM

## 2018-05-30 NOTE — Progress Notes (Signed)
[facility] NEPHROLOGY PROGRESS NOTE  Assessment/ Plan: Pt is a 63 y.o. yo female with hypertension, diabetes, CKD 3 admitted with pneumonia, sepsis and possible aortic fall marantic lesion, AKI requiring CVVH in the beginning now on intermittent hemo-.  #AKI on CKD 3 in the setting of sepsis, concomitant ARB and possible cardiorenal syndrome.  Patient received CRRT from 2/22 and transition to intermittent hemo-on 2/24.  Patient remains oliguric and dialysis dependent.  Receiving dialysis today, tolerating well.  Plan for tunneled catheter by IR.  Goal UF 2 to 3 L.  Patient is still has bilateral leg edema.  #Aortic valve vegetation concern for culture-negative versus marantic AV lesion: TEE was done.  #Hyperkalemia: Improved  #Acute on chronic diastolic CHF: Now managing volume with dialysis.  #Anemia of CKD: Hb 7.6.  I will check iron stores.  May benefit from ESA depending on iron.  #Hypertension: HD today.  Monitor blood pressure.  Continue current cardiac medication.  #Hyperphosphatemia: Check PTH level.  Start [place].  Subjective: Seen and examined in dialysis unit.  Has some headache.  Denied nausea, vomiting, chest pain or shortness of breath. Objective Vital signs in last 24 hours: Vitals:   05/30/18 1230 05/30/18 1300 05/30/18 1330 05/30/18 1400  BP: (!) 157/78 (!) 159/78 (!) 160/78 (!) 158/73  Pulse: 61 62 62 65  Resp: (!) 22 (!) 22 18 17   Temp:      TempSrc:      SpO2:      Weight:      Height:       Weight change: -1.892 kg  Intake/Output Summary (Last 24 hours) at 05/30/2018 1456 Last data filed at 05/30/2018 0830 Gross per 24 hour  Intake 1427.43 ml  Output 150 ml  Net 1277.43 ml       Labs: Basic Metabolic Panel: Recent Labs  Lab 05/28/18 0416 05/29/18 0234 05/30/18 0657  NA 137 138 135  K 3.8 4.2 4.4  CL 99 100 98  CO2 27 26 26   GLUCOSE 119* 119* 137*  BUN 30* 48* 73*  CREATININE 3.44* 4.65* 5.79*  CALCIUM 8.1* 8.4* 8.1*  PHOS  6.3* 7.9* 10.4*   Liver Function Tests: Recent Labs  Lab 05/28/18 0416 05/29/18 0234 05/30/18 0657  ALBUMIN 2.2* 2.4* 2.3*   No results for input(s): LIPASE, AMYLASE in the last 168 hours. No results for input(s): AMMONIA in the last 168 hours. CBC: Recent Labs  Lab 05/24/18 0500 05/27/18 0711 05/30/18 0657  WBC 7.5 7.2 7.2  HGB 7.7* 7.5* 7.6*  HCT 25.9* 24.6* 26.2*  MCV 93.5 92.5 94.6  PLT 218 194 235   Cardiac Enzymes: No results for input(s): CKTOTAL, CKMB, CKMBINDEX, TROPONINI in the last 168 hours. CBG: Recent Labs  Lab 05/29/18 1220 05/29/18 1619 05/29/18 2026 05/29/18 2131 05/30/18 0820  GLUCAP 146* 102* 135* 157* 123*    Iron Studies: No results for input(s): IRON, TIBC, TRANSFERRIN, FERRITIN in the last 72 hours. Studies/Results: No results found.  Medications: Infusions: . sodium chloride 10 mL/hr at 05/30/18 0830  . diltiazem (CARDIZEM) infusion 15 mg/hr (05/30/18 0519)  . feeding supplement (VITAL AF 1.2 CAL) Stopped (05/27/18 1900)    Scheduled Medications: . arformoterol  15 mcg Nebulization BID  . budesonide (PULMICORT) nebulizer solution  0.5 mg Nebulization BID  . feeding supplement (PRO-STAT SUGAR FREE 64)  30 mL Per Tube BID  . [START ON 05/31/2018] heparin  5,000 Units Subcutaneous Q8H  . insulin aspart  0-5 Units Subcutaneous QHS  .  insulin aspart  0-9 Units Subcutaneous TID WC  . ipratropium-albuterol  3 mL Nebulization TID  . isosorbide dinitrate  10 mg Oral TID  . metoprolol succinate  100 mg Oral Daily    have reviewed scheduled and prn medications.  Physical Exam: General:NAD, comfortable Heart:RRR, s1s2 nl Lungs: Basal decreased breath sound, no wheezing Abdomen:soft, Non-tender, non-distended Extremities: Lower extremity pitting edema++ Dialysis Access: temp IJ catheter  Ares Tegtmeyer Prasad Latrisa Hellums 05/30/2018,2:56 PM  LOS: 9 days

## 2018-05-30 NOTE — Care Management Important Message (Signed)
Important Message  Patient Details  Name: KYANNA MAHRT MRN: [id number] Date of Birth: 1955-05-26   Medicare Important Message Given:  Yes    Sirus Labrie P Kimimila Tauzin 05/30/2018, 2:06 PM

## 2018-05-30 NOTE — Progress Notes (Signed)
Patient ID: [name], female   DOB: 26-Aug-1955, 63 y.o.   MRN: [id number]   Legal guardian [name] has called and given consent for tunneled catheter placement for [name]  Risks and benefits discussed with the patient's legal guardian [name] including, but not limited to bleeding, infection, vascular injury, pneumothorax which may require chest tube placement, air embolism or even death All of [name]'s questions were answered, She agreeable to proceed. Consent signed and in chart.

## 2018-05-30 NOTE — Consult Note (Addendum)
Chief Complaint: Patient was seen in consultation today for tunneled dialysis catheter placement Chief Complaint  Patient presents with  . Respiratory Distress   at the request of Dr Marval Regal   Supervising Physician: Aletta Edouard  Patient Status: [facility] - In-pt  History of Present Illness: [name] is a 63 y.o. female   Acute on chronic kidney disease Admitted with sepsis vs cardiorenal syndrome Volume overload Started on dialysis - temp catheter placed Rt IJ 05/21/18 per PCCM Remains dialysis dependent Dr Marval Regal requesting tunneled catheter placement  Plan for today  Pt with mental disability Has legal guardian  Past Medical History:  Diagnosis Date  . Arthritis   . Asthma   . CHF (congestive heart failure) ([place])   . Diabetes mellitus without complication ([place])   . Hypertension   . Iron deficiency anemia 10/16/2010  . Mental handicap 10/16/2010    Past Surgical History:  Procedure Laterality Date  . ABDOMINAL HYSTERECTOMY    . CESAREAN SECTION    . CHOLECYSTECTOMY    . COLONOSCOPY  08/2010   normal TI, sigmoid polyp (adenoma ). Next TCS due  08/2015,  . ESOPHAGOGASTRODUODENOSCOPY  08/2010   antral and duodenal erosions s/p bx (chronic gastritis, no h.pylori, no celiac dz ), hiatal hernia  . KNEE SURGERY     right knee @ 63 years of age  . LEFT AND RIGHT HEART CATHETERIZATION WITH CORONARY ANGIOGRAM N/A 09/21/2011   Procedure: LEFT AND RIGHT HEART CATHETERIZATION WITH CORONARY ANGIOGRAM;  Surgeon: Birdie Riddle, MD;  Location: [place] CATH LAB;  Service: Cardiovascular;  Laterality: N/A;    Allergies: Patient has no known allergies.  Medications: Prior to Admission medications   Medication Sig Start Date End Date Taking? Authorizing Provider  acetaminophen (TYLENOL) 500 MG tablet Take 500 mg by mouth 2 (two) times daily as needed for mild pain.   Yes [provider]  albuterol (PROVENTIL HFA;VENTOLIN HFA) 108 (90 Base) MCG/ACT inhaler  Inhale 2 puffs into the lungs every 6 (six) hours as needed for shortness of breath.   Yes [provider]  amoxicillin-clavulanate (AUGMENTIN) 875-125 MG tablet Take 1 tablet by mouth 2 (two) times daily. Started on 05-20-18 DS 15 05/20/18  Yes [provider]  aspirin EC 81 MG tablet Take 81 mg by mouth daily.   Yes [provider]  Choline Fenofibrate (FENOFIBRIC ACID) 135 MG CPDR Take 1 capsule by mouth daily.   Yes [provider]  Diphenhyd-Hydrocort-Nystatin (FIRST-DUKES MOUTHWASH) SUSP Use as directed 5 mLs in the mouth or throat 4 (four) times daily. Swish and spit   Yes [provider]  fluticasone (VERAMYST) 27.5 MCG/SPRAY nasal spray Place 2 sprays into the nose daily.   Yes [provider]  Fluticasone-Umeclidin-Vilant (TRELEGY ELLIPTA) 100-62.5-25 MCG/INH AEPB Inhale into the lungs daily.   Yes [provider]  furosemide (LASIX) 40 MG tablet Take 40 mg by mouth daily.   Yes [provider]  guaiFENesin (ROBITUSSIN) 100 MG/5ML SOLN Take 5 mLs (100 mg total) by mouth every 4 (four) hours as needed for cough or to loosen phlegm. 05/16/18  Yes Shah, Pratik D, DO  hydrALAZINE (APRESOLINE) 25 MG tablet Take 25 mg by mouth 3 (three) times daily.   Yes [provider]  insulin regular human CONCENTRATED (HUMULIN R U-500 KWIKPEN) 500 UNIT/ML kwikpen Inject 20 Units into the skin 3 (three) times daily with meals. Inject 40 units with breakfast, 40 units with lunch, and 40 units with supper when  blood glucose before food is greater than 90 mg/dL Patient taking differently: Inject 40 Units into the skin 3 (three) times daily before meals. Inject 40 units w food if BS greater than 90 mg/dL 05/16/18  Yes Shah, Pratik D, DO  ipratropium-albuterol (DUONEB) 0.5-2.5 (3) MG/3ML SOLN Take 3 mLs by nebulization every 6 (six) hours as needed (shortness of breath).   Yes [provider]  isosorbide dinitrate (ISORDIL) 20 MG  tablet Take 20 mg by mouth 3 (three) times daily.   Yes [provider]  loratadine (CLARITIN) 10 MG tablet Take 10 mg by mouth daily.   Yes [provider]  losartan (COZAAR) 100 MG tablet Take 100 mg by mouth daily.  05/09/18  Yes [provider]  methylPREDNISolone (MEDROL DOSEPAK) 4 MG TBPK tablet Take 4 mg by mouth as directed. On package 05/20/18  Yes [provider]  potassium chloride SA (K-DUR,KLOR-CON) 20 MEQ tablet Take 20 mEq by mouth daily.  11/10/17  Yes [provider]  triamcinolone ointment (KENALOG) 0.5 % Apply 1 application topically 2 (two) times daily as needed (rash).   Yes [provider]  ACCU-CHEK AVIVA PLUS test strip USE TO TEST THREE TIMES DAILY. 02/23/18   Cassandria Anger, MD  ACCU-CHEK SOFTCLIX LANCETS lancets USE TO TEST THREE TIMES DAILY. 02/23/18   Cassandria Anger, MD  NOVOFINE AUTOCOVER 30G X 8 MM MISC USE AS DIRECTED FOR 3 TIMES DAILY INSULIN ADMINISTRATION. 04/04/18   Nida, Marella Chimes, MD  SURE COMFORT PEN NEEDLES 31G X 8 MM MISC USE AS DIRECTED WITH LEVEMIR AND NOVOLOG. UP TO FOUR TIMES DAILY. 02/12/17   Cassandria Anger, MD     Family History  Problem Relation Age of Onset  . Diabetes Mother   . Hypertension Mother   . Hypertension Father   . Kidney disease Other        son reportedly has had cyst on kidney and lung s/p surgery [facility]  . Hypertension Son   . Colon cancer Neg Hx     Social History   Socioeconomic History  . Marital status: Divorced    Spouse name: Not on file  . Number of children: 1  . Years of education: Not on file  . Highest education level: Not on file  Occupational History    Employer: UNEMPLOYED  Social Needs  . Financial resource strain: Not on file  . Food insecurity:    Worry: Not on file    Inability: Not on file  . Transportation needs:    Medical: Not on file    Non-medical: Not on file  Tobacco Use  . Smoking status: Former Smoker     Packs/day: 1.00    Years: 0.00    Pack years: 0.00    Types: Cigarettes    Last attempt to quit: 04/11/1996    Years since quitting: 22.1  . Smokeless tobacco: Never Used  . Tobacco comment: quit a couple year ago  Substance and Sexual Activity  . Alcohol use: No  . Drug use: No  . Sexual activity: Never  Lifestyle  . Physical activity:    Days per week: Not on file    Minutes per session: Not on file  . Stress: Not on file  Relationships  . Social connections:    Talks on phone: Not on file    Gets together: Not on file    Attends religious service: Not on file    Active member of club or  organization: Not on file    Attends meetings of clubs or organizations: Not on file    Relationship status: Not on file  Other Topics Concern  . Not on file  Social History Narrative   Lives with parents.     Review of Systems: A 12 point ROS discussed and pertinent positives are indicated in the HPI above.  All other systems are negative.  Review of Systems  Constitutional: Positive for activity change. Negative for appetite change and fever.  Respiratory: Positive for cough.   Cardiovascular: Negative for chest pain.  Neurological: Positive for weakness.  Psychiatric/Behavioral: Positive for decreased concentration. Negative for behavioral problems.    Vital Signs: BP 137/86 (BP Location: Right Arm)   Pulse 64   Temp 98 F (36.7 C) (Oral)   Resp (!) 22   Ht 5\' 4"  (1.626 m)   Wt 269 lb 3.2 oz (122.1 kg)   SpO2 98%   BMI 46.21 kg/m   Physical Exam Vitals signs reviewed.  Cardiovascular:     Rate and Rhythm: Normal rate and regular rhythm.     Heart sounds: Normal heart sounds.  Pulmonary:     Breath sounds: Normal breath sounds.  Abdominal:     General: Bowel sounds are normal.     Palpations: Abdomen is soft.  Musculoskeletal: Normal range of motion.  Skin:    General: Skin is warm and dry.  Neurological:     Mental Status: She is alert. Mental status is at  baseline.  Psychiatric:     Comments: Called to [facility] Waiting to hear back for consent     Imaging: Dg Chest 2 View  Result Date: 05/13/2018 CLINICAL DATA:  Cough with vomiting, diarrhea for 3 weeks. EXAM: CHEST - 2 VIEW COMPARISON:  Radiographs 09/03/2017.  CT 08/10/2017. FINDINGS: The heart size is stable at the upper limits of normal for AP technique. There is chronic left lower lobe opacity which has become more mass-like on today's lateral view, previously linear. The lungs are otherwise clear. There is no pleural effusion or pneumothorax. The bones appear unchanged. IMPRESSION: Increased, mass-like density surrounding the previously demonstrated linear left lower lobe atelectasis/scarring. This could be secondary to superimposed inflammation, although repeat chest CT recommended to exclude developing neoplasm. Electronically Signed   By: Richardean Sale M.D.   On: 05/13/2018 19:40   Ct Chest Wo Contrast  Result Date: 05/14/2018 CLINICAL DATA:  Chest wall pain. No IV contrast due to poor renal function. Evaluate masslike density left lower lobe. Worsening cough over the past week. Fever and chills with episodes of vomiting. EXAM: CT CHEST WITHOUT CONTRAST TECHNIQUE: Multidetector CT imaging of the chest was performed following the standard protocol without IV contrast. COMPARISON:  Chest CT 08/10/2017 and abdominal CT 05/21/2016 FINDINGS: Cardiovascular: Heart is normal size. Calcification over the left main coronary artery. Minimal calcified plaque over the thoracic aorta. Vascular structures are otherwise unremarkable. Mediastinum/Nodes: Few shotty mediastinal lymph nodes which are more prominent than seen previously. Suggestion of left infrahilar adenopathy although difficult to assess without intravenous contrast. Remaining mediastinal structures are unremarkable. Lungs/Pleura: Lungs are adequately inflated. There is masslike consolidation over the posterior left lower  lobe measuring approximately 5 x 8.2 cm. This is present over the area of previously seen linear airspace density. There is cut off of the left lower lobe bronchus entering into this region of the lower lobe just proximal to this masslike consolidation. There is minimal adjacent patchy airspace density in the  left lower lobe. Tiny amount of left pleural fluid remainder of the left lung as well as the right lung are clear. Upper Abdomen: Right adrenal mass without significant change from the prior exam although difficult to evaluate without intravenous contrast as it abuts the adjacent IVC. Previous cholecystectomy. Minimal calcified plaque over the abdominal aorta. Musculoskeletal: Mild degenerate change of the spine. Slight interval progression of a moderate T10 compression fracture IMPRESSION: Masslike consolidation measuring 5 x 8.2 cm over the left lower lobe. Cut off of the left lower lobe bronchus proximal to this masslike density. Subtle adjacent patchy airspace density in the left lower lobe as well as tiny amount of left pleural fluid. Suggestion of left hilar adenopathy, although difficult to evaluate without intravenous contrast. Findings are worrisome for a neoplastic process and less likely infection or inflammatory process. Given patient's infectious clinical presentation, a short interval follow-up CT in 4 weeks may be helpful. If findings unchanged than recommend thoracic surgery consultation. Indeterminate left adrenal mass as seen on the previous exam exams as difficult to assess change as it is incompletely imaged on this exam. Minimal atherosclerotic coronary artery disease. Aortic Atherosclerosis (ICD10-I70.0). Slight interval progression of patient's known moderate T10 compression fracture. Electronically Signed   By: Marin Olp M.D.   On: 05/14/2018 00:58   US Renal  Result Date: 05/21/2018 CLINICAL DATA:  Acute kidney injury. EXAM: RENAL / URINARY TRACT ULTRASOUND COMPLETE COMPARISON:   11/17/2017 FINDINGS: Right Kidney: Renal measurements: 10.4 x 5.4 x 4.9 cm = volume: 142 mL . Echogenicity within normal limits. No mass or hydronephrosis visualized. Left Kidney: Renal measurements: 11.5 x 6.0 x 5.1 cm = volume: 181 mL. Echogenicity within normal limits. No mass or hydronephrosis visualized. Bladder: Foley catheter in place, decompressed. The previously seen adrenal nodule/adenoma noted, measuring 3.6 x 3.6 x 2.3 cm. IMPRESSION: No acute renal abnormality.  No hydronephrosis. Electronically Signed   By: Rolm Baptise M.D.   On: 05/21/2018 22:20   Dg Chest Port 1 View  Result Date: 05/26/2018 CLINICAL DATA:  Post bronchoscopy. EXAM: PORTABLE CHEST 1 VIEW COMPARISON:  05/22/2018. FINDINGS: Right IJ line noted with tip over superior vena cava. NG tube noted with tip below left hemidiaphragm. Cardiomegaly with bilateral pulmonary infiltrates/edema left side greater than right consistent with CHF. Bilateral pneumonia can not be excluded. Findings have progressed from prior exam. Small left pleural effusion. No pneumothorax. IMPRESSION: Lines and tubes in stable position. Congestive heart failure bilateral pulmonary edema left side greater right and left-sided pleural effusion. Findings consistent CHF. Bilateral pneumonia can not be excluded. Findings have progressed from prior exam. Electronically Signed   By: [place]   On: 05/26/2018 14:02   Dg Chest Port 1 View  Result Date: 05/22/2018 CLINICAL DATA:  Respiratory failure EXAM: PORTABLE CHEST 1 VIEW COMPARISON:  1 day prior FINDINGS: Endotracheal tube is unchanged in position. Nasogastric tube extends beyond the inferior aspect of the film. Right internal jugular line unchanged with tip at mid to low SVC. Cardiomegaly accentuated by AP portable technique. No right-sided pleural effusion. Cannot exclude small left pleural effusion. No pneumothorax. Improved inspiratory effort and aeration. No overt congestive failure. Retrocardiac left  lower lobe airspace disease is felt to be slightly improved. IMPRESSION: Improved aeration, with resolved interstitial edema and decreased left base opacity. Suspect residual small left pleural effusion. Appropriate position of support apparatus. Electronically Signed   By: Abigail Miyamoto M.D.   On: 05/22/2018 14:59   Dg Chest [facility]  Result Date: 05/21/2018 CLINICAL DATA:  Central line placement. EXAM: PORTABLE CHEST 1 VIEW COMPARISON:  Earlier same day FINDINGS: 2119 hours. Endotracheal tube tip is 3 cm above the base of the carina. The NG tube passes into the stomach although the distal tip position is not included on the film. Right IJ central line tip overlies the proximal SVC. No evidence for pneumothorax or substantial pleural effusion. Lung volumes are low. The cardio pericardial silhouette is enlarged. Persistent retrocardiac left base collapse/consolidation. There is pulmonary vascular congestion without overt pulmonary edema. Telemetry leads overlie the chest. IMPRESSION: 1. Right IJ central line placement with tip overlying the proximal SVC level. No pneumothorax or substantial pleural effusion. 2. Cardiomegaly with vascular congestion and low lung volumes. 3. Similar appearance of the retrocardiac left base collapse/consolidation. Electronically Signed   By: Misty Stanley M.D.   On: 05/21/2018 21:45   Dg Chest Port 1 View  Result Date: 05/21/2018 CLINICAL DATA:  Ventilator dependent. EXAM: PORTABLE CHEST 1 VIEW COMPARISON:  05/21/2018 FINDINGS: Endotracheal tube is 3 cm above the carina. NG tube enters the stomach. Cardiomegaly with vascular congestion. Left lower lobe airspace opacity again noted, unchanged. No focal opacity on the right. No visible significant effusions or acute bony abnormality. IMPRESSION: Left lower lobe airspace opacity, stable. Mild cardiomegaly and vascular congestion. Electronically Signed   By: Rolm Baptise M.D.   On: 05/21/2018 20:38   Dg Chest Portable 1  View  Result Date: 05/21/2018 CLINICAL DATA:  This post intubation EXAM: PORTABLE CHEST 1 VIEW COMPARISON:  05/13/2018 FINDINGS: Endotracheal to is in the right mainstem bronchus. Recommend retracting approximately 2-3 cm. Cardiomegaly. Low lung volumes. Increasing left perihilar and lower lobe airspace opacity which may reflect atelectasis from the right mainstem intubation. Vascular congestion. No visible effusions or acute bony abnormality. IMPRESSION: Endotracheal tube just into the right mainstem bronchus. Recommend retracting 2-3 cm. Increasing left perihilar and lower lobe opacity could reflect associated atelectasis related to right mainstem intubation. Vascular congestion, low lung volumes. These results were called by telephone at the time of interpretation on 05/21/2018 at 4:08 pm to Dr. Nat Christen , who verbally acknowledged these results. Electronically Signed   By: Rolm Baptise M.D.   On: 05/21/2018 16:08    Labs:  CBC: Recent Labs    05/23/18 0329 05/24/18 0500 05/27/18 0711 05/30/18 0657  WBC 8.1 7.5 7.2 7.2  HGB 6.7* 7.7* 7.5* 7.6*  HCT 22.6* 25.9* 24.6* 26.2*  PLT 252 218 194 235    COAGS: Recent Labs    05/22/18 0441  APTT 29    BMP: Recent Labs    05/27/18 0427 05/28/18 0416 05/29/18 0234 05/30/18 0657  NA 137 137 138 135  K 4.4 3.8 4.2 4.4  CL 101 99 100 98  CO2 26 27 26 26   GLUCOSE 163* 119* 119* 137*  BUN 55* 30* 48* 73*  CALCIUM 8.2* 8.1* 8.4* 8.1*  CREATININE 5.11* 3.44* 4.65* 5.79*  GFRNONAA 8* 14* 9* 7*  GFRAA 10* 16* 11* 8*    LIVER FUNCTION TESTS: Recent Labs    02/16/18 0741  05/13/18 1817 05/21/18 1515 05/21/18 2000  05/27/18 0427 05/28/18 0416 05/29/18 0234 05/30/18 0657  BILITOT 0.3  --  0.6 0.3 0.6  --   --   --   --   --   AST 20  --  29 30 93*  --   --   --   --   --   ALT 17  --  32 20 38  --   --   --   --   --   ALKPHOS  --   --  93 54 56  --   --   --   --   --   PROT 6.6  --  8.6* 7.4 6.7  --   --   --   --   --    ALBUMIN  --    < > 2.8* 2.9* 2.5*   < > 2.2* 2.2* 2.4* 2.3*   < > = values in this interval not displayed.    TUMOR MARKERS: No results for input(s): AFPTM, CEA, CA199, CHROMGRNA in the last 8760 hours.  Assessment and Plan:  Acute on chronic renal failure Dialysis since 05/21/18 Minimal to no recovery Need for permanent access Request for tunneled dialysis catheter placement  Awaiting legal guardian to call back for consent   Thank you for this interesting consult.  I greatly enjoyed meeting [name] and look forward to participating in their care.  A copy of this report was sent to the requesting provider on this date.  Electronically Signed: Lavonia Drafts, PA-C 05/30/2018, 8:05 AM   I spent a total of 40 Minutes    in face to face in clinical consultation, greater than 50% of which was counseling/coordinating care for tunneled dialysis catheter placement

## 2018-05-30 NOTE — Progress Notes (Signed)
PROGRESS NOTE    [name]  AOZ:[id number] DOB: 1955-06-09 DOA: 05/21/2018 PCP: Rosita Fire, MD      Brief Narrative:  [name] is a 62 y.o. F with mental disability in LTC, asthma well-controlled, CKD III Baseline Cr 1.3-1.6, HTN, and DM who presented with severe dyspnea requiring BiPAP by EMS.  On arrival in the ER was in V. tach, cardiology consulted and given amiodarone bolus, then developed unstable wide-complex tachycardia and acute respiratory failure.  Creatinine 4.3, potassium 7.5 on admission, pH 7.15, PCO2 45, lactic acid 3.2, chest x-ray pneumonia.  Temporizing potassium measures given, broad-spectrum antibiotics started, intubated and transferred to ICU at [facility].  Extubated 2/28 after 6 days.       Assessment & Plan:   Acute respiratory failure with hypoxia and hypercarbia Multifactorial from ventricular tachycardia, fluid overload from CHF and renal failure, pneumonia, asthma.  Extubated 2/28, stable on O2 by Nina. -Wean O2 as able   Postobstructive pneumonia Afebrile, HR normal, RR normal, taking orals, mentation back to baseline, but still requiring supplemental O2.   -Continue Augmentin, day 8 of 10 -Follow surg path -Respiratory toilet  Carcinoid tumor of left lower lobe Pathology from [place] shows carcinoid. -Consult Oncology  Acute renal failure on baseline CKD III Hyperkalemia Metabolic acidosis Lactic acidosis Creatinine 1.3-1.6 at baseline.  Now dialysis dependent.  Remains oliguric.  IR placed tunneled catheter this morning. 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  Blood cultures negative x4.  Doubt culture negative endocarditis.  In setting of suspected lung cancer, this is likely marantic.  Acute on chronic diastolic CHF Hypertension Appears volume overloaded, has failed UOP with Lasix.  BP better -Continue aspirin, Isordil, metoprolol -Restart  home Isordil -Continue metoprolol -Hold home hydralazine, ARB  Ventricular tachycardia Junctional rhythm in setting of hyperkalemia Resolved -Continue metoprolol  History of asthma -Continue aformoterol and budesonide -Duonebs TID -Hold Trelegy from home, loratadine  Diabetes Good glucose control -Continue sliding scale correction insulin -Hold home U500  Sleep apnea -BiPAP at night  Paroxysmal atrial fibrillation Return to atrial fibrillation last night with RVR.  Started on tilt drip, converted to sinus rhythm this morning. -Wean off Dil drip, start diltiazem 30 every 6 for now we will consolidate tomorrow -Will idsucss anticoagulation with Oncology, family         MDM and disposition: The below labs and imaging reports were reviewed and summarized above.  Medication management as above, including discussion of anticoagulation.  The patient was admitted with respiratory failure from pneumonia and ventricular tachycardia.  She was found to have a lung mass, which turns out to be a carcinoid tumor.  Unfortunately her renal function has failed and is now recovering at the moment.  She will need intermittent dialysis, involve palliative care.  Consult oncology.          DVT prophylaxis: Heparin Code Status: FULL Family Communication: Guardian by phone    Consultants:   CCM  Cardiology  Nephrology  IR  Procedures:   Intubation 2/22  Bronchoscopy 2/27  TEE 2/27  Extubation 2/28  Antimicrobials:   Vancomycin 2/22 >> 2/26  Cefepime 2/22 >> 2/23  Unasyn/Augmentin 2/24 >>      Subjective: No dyspnea, chest pain, chest tightness, fever, confusion.  A little heart fluttering last night, this is resolved.  No cough or sputum today.  Leg swelling persists.  Objective: Vitals:   05/30/18 1230 05/30/18 1300 05/30/18 1330 05/30/18 1400  BP: (!) 157/78 (!) 159/78 (!) 160/78 (!) 158/73  Pulse: 61 62 62 65  Resp: (!) 22 (!) _0 Temp:       TempSrc:      SpO2:      Weight:      Height:        Intake/Output Summary (Last 24 hours) at 05/30/2018 1551 Last data filed at 05/30/2018 0830 Gross per 24 hour  Intake 1427.43 ml  Output 150 ml  Net 1277.43 ml   Filed Weights   05/29/18 0500 05/30/18 0508 05/30/18 1204  Weight: 122.5 kg 122.1 kg 123.9 kg    Examination: General appearance: Older adult female, sitting in recliner, no acute distress, interactive HEENT: Anicteric, conjunctiva pink, lids and lashes normal. No nasal deformity, discharge, epistaxis.  Lips moist, dentition normal, oropharynx moist, no oral lesions, hearing normal.   Skin: Warm and dry.  No jaundice.  No suspicious rashes or lesions. Cardiac: Regular rate and rhythm, no murmurs appreciated, JVP not visible, pitting lower extremity edema bilaterally. Respiratory: Normal respiratory rate and rhythm, lungs clear without rales or wheezes. Abdomen: Abdomen soft, no tenderness to palpation, no ascites or distention.  MSK: No deformities or effusions. Neuro: Awake and alert, extraocular movements intact, moves all extremities, speech fluent.    Psych: Sodium intact responding to questions, attention normal, affect normal, judgment and insight appear mildly impaired.     Data Reviewed: I have personally reviewed following labs and imaging studies:  CBC: Recent Labs  Lab 05/24/18 0500 05/27/18 0711 05/30/18 0657  WBC 7.5 7.2 7.2  HGB 7.7* 7.5* 7.6*  HCT 25.9* 24.6* 26.2*  MCV 93.5 92.5 94.6  PLT 218 194 161   Basic Metabolic Panel: Recent Labs  Lab 05/26/18 0420 05/27/18 0427 05/28/18 0416 05/29/18 0234 05/30/18 0657  NA 135 137 137 138 135  K 4.4 4.4 3.8 4.2 4.4  CL 99 101 99 100 98  CO2 _1 GLUCOSE 192* 163* 119* 119* 137*  BUN 36* 55* 30* 48* 73*  CREATININE 3.76* 5.11* 3.44* 4.65* 5.79*  CALCIUM 7.7* 8.2* 8.1* 8.4* 8.1*  PHOS 4.6 6.7* 6.3* 7.9* 10.4*   GFR: Estimated Creatinine Clearance: 13.1 mL/min (A) (by C-G formula  based on SCr of 5.79 mg/dL (H)). Liver Function Tests: Recent Labs  Lab 05/26/18 0420 05/27/18 0427 05/28/18 0416 05/29/18 0234 05/30/18 0657  ALBUMIN 2.1* 2.2* 2.2* 2.4* 2.3*   No results for input(s): LIPASE, AMYLASE in the last 168 hours. No results for input(s): AMMONIA in the last 168 hours. Coagulation Profile: Recent Labs  Lab 05/30/18 1015  INR 1.7*   Cardiac Enzymes: No results for input(s): CKTOTAL, CKMB, CKMBINDEX, TROPONINI in the last 168 hours. BNP (last 3 results) No results for input(s): PROBNP in the last 8760 hours. HbA1C: No results for input(s): HGBA1C in the last 72 hours. CBG: Recent Labs  Lab 05/29/18 1220 05/29/18 1619 05/29/18 2026 05/29/18 2131 05/30/18 0820  GLUCAP 146* 102* 135* 157* 123*   Lipid Profile: No results for input(s): CHOL, HDL, LDLCALC, TRIG, CHOLHDL, LDLDIRECT in the last 72 hours. Thyroid Function Tests: No results for input(s): TSH, T4TOTAL, FREET4, T3FREE, THYROIDAB in the last 72 hours. Anemia Panel: No results for input(s): VITAMINB12, FOLATE, FERRITIN, TIBC, IRON, RETICCTPCT in the last 72 hours. Urine analysis:    Component Value Date/Time   COLORURINE AMBER (A) 05/22/2018 0758   APPEARANCEUR CLOUDY (A) 05/22/2018 0758   LABSPEC 1.025 05/22/2018 0758   PHURINE 5.0  05/22/2018 0758   GLUCOSEU 50 (A) 05/22/2018 0758   HGBUR LARGE (A) 05/22/2018 0758   BILIRUBINUR NEGATIVE 05/22/2018 0758   KETONESUR 5 (A) 05/22/2018 0758   PROTEINUR 100 (A) 05/22/2018 0758   UROBILINOGEN 0.2 02/01/2013 1145   NITRITE NEGATIVE 05/22/2018 0758   LEUKOCYTESUR SMALL (A) 05/22/2018 0758   Sepsis Labs: _0 (procalcitonin:4,lacticacidven:4)  ) Recent Results (from the past 240 hour(s))  Blood culture (routine x 2)     Status: None   Collection Time: 05/21/18  4:36 PM  Result Value Ref Range Status   Specimen Description BLOOD LEFT HAND  Final   Special Requests   Final    BOTTLES DRAWN AEROBIC AND ANAEROBIC Blood Culture  adequate volume   Culture   Final    NO GROWTH 5 DAYS Performed at [facility], [address], [place], [place] [address]    Report Status 05/26/2018 FINAL  Final  Blood culture (routine x 2)     Status: None   Collection Time: 05/21/18  4:36 PM  Result Value Ref Range Status   Specimen Description BLOOD LEFT ARM  Final   Special Requests   Final    BOTTLES DRAWN AEROBIC ONLY Blood Culture adequate volume   Culture   Final    NO GROWTH 5 DAYS Performed at [facility], [address] [place], [place] [address]    Report Status 05/26/2018 FINAL  Final  MRSA PCR Screening     Status: None   Collection Time: 05/21/18 11:32 PM  Result Value Ref Range Status   MRSA by PCR NEGATIVE NEGATIVE Final    Comment:        The GeneXpert MRSA Assay (FDA approved for NASAL specimens only), is one component of a comprehensive MRSA colonization surveillance program. It is not intended to diagnose MRSA infection nor to guide or monitor treatment for MRSA infections. Performed at [facility], [place] [address], [place], [place] [address]   Culture, blood (routine x 2)     Status: None   Collection Time: 05/24/18  1:04 PM  Result Value Ref Range Status   Specimen Description BLOOD RIGHT ANTECUBITAL  Final   Special Requests AEROBIC BOTTLE ONLY Blood Culture adequate volume  Final   Culture   Final    NO GROWTH 5 DAYS Performed at [facility], [place] [address] [place], [place] [address]    Report Status 05/29/2018 FINAL  Final  Culture, blood (routine x 2)     Status: None   Collection Time: 05/24/18  1:08 PM  Result Value Ref Range Status   Specimen Description BLOOD BLOOD RIGHT HAND  Final   Special Requests AEROBIC BOTTLE ONLY Blood Culture adequate volume  Final   Culture   Final    NO GROWTH 5 DAYS Performed at [facility], [place] [address], [place], [place] [address]    Report Status 05/29/2018 FINAL  Final  Culture, bal-quantitative     Status: Abnormal    Collection Time: 05/26/18 12:17 PM  Result Value Ref Range Status   Specimen Description BRONCHIAL ALVEOLAR LAVAGE  Final   Special Requests LLL  Final   Gram Stain   Final    ABUNDANT WBC PRESENT, PREDOMINANTLY PMN NO ORGANISMS SEEN    Culture (A)  Final    30,000 COLONIES/mL Consistent with normal respiratory flora. Performed at [facility], [place] [address], [place], [place] [address]    Report Status 05/28/2018 FINAL  Final  Radiology Studies: No results found.      Scheduled Meds: . arformoterol  15 mcg Nebulization BID  . budesonide (PULMICORT) nebulizer solution  0.5 mg Nebulization BID  . feeding supplement (PRO-STAT SUGAR FREE 64)  30 mL Per Tube BID  . ferric citrate  420 mg Oral TID WC  . [START ON 05/31/2018] heparin  5,000 Units Subcutaneous Q8H  . insulin aspart  0-5 Units Subcutaneous QHS  . insulin aspart  0-9 Units Subcutaneous TID WC  . ipratropium-albuterol  3 mL Nebulization TID  . isosorbide dinitrate  10 mg Oral TID  . metoprolol succinate  100 mg Oral Daily   Continuous Infusions: . sodium chloride 10 mL/hr at 05/30/18 0830  . diltiazem (CARDIZEM) infusion 15 mg/hr (05/30/18 0519)  . feeding supplement (VITAL AF 1.2 CAL) Stopped (05/27/18 1900)     LOS: 9 days    Time spent: 35 minutes    Edwin Dada, MD [facility] 05/30/2018, 3:51 PM     Please page through [place]:  [email or web address] Password [email or web address] If 7PM-7AM, please contact night-coverage

## 2018-05-31 ENCOUNTER — Encounter (HOSPITAL_COMMUNITY): Payer: Self-pay | Admitting: Interventional Radiology

## 2018-05-31 ENCOUNTER — Inpatient Hospital Stay (HOSPITAL_COMMUNITY): Payer: Medicare Other

## 2018-05-31 DIAGNOSIS — I358 Other nonrheumatic aortic valve disorders: Secondary | ICD-10-CM

## 2018-05-31 DIAGNOSIS — I472 Ventricular tachycardia: Secondary | ICD-10-CM

## 2018-05-31 DIAGNOSIS — Z515 Encounter for palliative care: Secondary | ICD-10-CM

## 2018-05-31 DIAGNOSIS — Z7189 Other specified counseling: Secondary | ICD-10-CM

## 2018-05-31 HISTORY — PX: IR FLUORO GUIDE CV LINE RIGHT: IMG2283

## 2018-05-31 HISTORY — PX: IR US GUIDE VASC ACCESS RIGHT: IMG2390

## 2018-05-31 LAB — RENAL FUNCTION PANEL
Albumin: 2.4 g/dL — ABNORMAL LOW (ref 3.5–5.0)
Anion gap: 10 (ref 5–15)
BUN: 38 mg/dL — ABNORMAL HIGH (ref 8–23)
CO2: 27 mmol/L (ref 22–32)
Calcium: 8.2 mg/dL — ABNORMAL LOW (ref 8.9–10.3)
Chloride: 96 mmol/L — ABNORMAL LOW (ref 98–111)
Creatinine, Ser: 3.7 mg/dL — ABNORMAL HIGH (ref 0.44–1.00)
GFR calc Af Amer: 14 mL/min — ABNORMAL LOW (ref >60)
GFR calc non Af Amer: 12 mL/min — ABNORMAL LOW (ref >60)
Glucose, Bld: 124 mg/dL — ABNORMAL HIGH (ref 70–99)
Phosphorus: 6.3 mg/dL — ABNORMAL HIGH (ref 2.5–4.6)
Potassium: 3.6 mmol/L (ref 3.5–5.1)
Sodium: 133 mmol/L — ABNORMAL LOW (ref 135–145)

## 2018-05-31 LAB — IRON AND TIBC
Iron: 35 ug/dL (ref 28–170)
Saturation Ratios: 14 % (ref 10.4–31.8)
TIBC: 258 ug/dL (ref 250–450)
UIBC: 223 ug/dL

## 2018-05-31 LAB — PROTIME-INR
INR: 1.1 (ref 0.8–1.2)
Prothrombin Time: 14.3 seconds (ref 11.4–15.2)

## 2018-05-31 LAB — GLUCOSE, CAPILLARY
Glucose-Capillary: 119 mg/dL — ABNORMAL HIGH (ref 70–99)
Glucose-Capillary: 122 mg/dL — ABNORMAL HIGH (ref 70–99)
Glucose-Capillary: 109 mg/dL — ABNORMAL HIGH (ref 70–99)
Glucose-Capillary: 174 mg/dL — ABNORMAL HIGH (ref 70–99)

## 2018-05-31 LAB — CBC
HCT: 26.1 % — ABNORMAL LOW (ref 36.0–46.0)
Hemoglobin: 7.8 g/dL — ABNORMAL LOW (ref 12.0–15.0)
MCH: 28.1 pg (ref 26.0–34.0)
MCHC: 29.9 g/dL — ABNORMAL LOW (ref 30.0–36.0)
MCV: 93.9 fL (ref 80.0–100.0)
Platelets: 242 10*3/uL (ref 150–400)
RBC: 2.78 MIL/uL — ABNORMAL LOW (ref 3.87–5.11)
RDW: 13.8 % (ref 11.5–15.5)
WBC: 6.2 10*3/uL (ref 4.0–10.5)
nRBC: 0 % (ref 0.0–0.2)

## 2018-05-31 LAB — FERRITIN: Ferritin: 266 ng/mL (ref 11–307)

## 2018-05-31 MED ORDER — FENTANYL CITRATE (PF) 100 MCG/2ML IJ SOLN
INTRAMUSCULAR | Status: AC
  Filled 2018-05-31: qty 2

## 2018-05-31 MED ORDER — CEFAZOLIN SODIUM-DEXTROSE 2-4 GM/100ML-% IV SOLN
INTRAVENOUS | Status: AC
  Filled 2018-05-31: qty 100

## 2018-05-31 MED ORDER — TRAMADOL HCL 50 MG PO TABS
50.0000 mg | ORAL_TABLET | Freq: Once | ORAL | Status: AC
  Administered 2018-05-31: 50 mg via ORAL
  Filled 2018-05-31: qty 1

## 2018-05-31 MED ORDER — GELATIN ABSORBABLE 12-7 MM EX MISC
CUTANEOUS | Status: AC | PRN
  Administered 2018-05-31: 1 via TOPICAL

## 2018-05-31 MED ORDER — NEPRO/CARBSTEADY PO LIQD
237.0000 mL | Freq: Two times a day (BID) | ORAL | Status: DC
  Administered 2018-05-31 – 2018-06-08 (×12): 237 mL via ORAL

## 2018-05-31 MED ORDER — CHLORHEXIDINE GLUCONATE CLOTH 2 % EX PADS
6.0000 | MEDICATED_PAD | Freq: Every day | CUTANEOUS | Status: DC
  Administered 2018-05-31 – 2018-06-05 (×6): 6 via TOPICAL

## 2018-05-31 MED ORDER — RENA-VITE PO TABS
1.0000 | ORAL_TABLET | Freq: Every day | ORAL | Status: DC
  Administered 2018-05-31 – 2018-06-08 (×9): 1 via ORAL
  Filled 2018-05-31 (×9): qty 1

## 2018-05-31 MED ORDER — HEPARIN SODIUM (PORCINE) 1000 UNIT/ML IJ SOLN
INTRAMUSCULAR | Status: AC
  Filled 2018-05-31: qty 1

## 2018-05-31 MED ORDER — DILTIAZEM HCL ER COATED BEADS 120 MG PO CP24
120.0000 mg | ORAL_CAPSULE | Freq: Every day | ORAL | Status: DC
  Administered 2018-05-31: 120 mg via ORAL
  Filled 2018-05-31: qty 1

## 2018-05-31 MED ORDER — MIDAZOLAM HCL 2 MG/2ML IJ SOLN
INTRAMUSCULAR | Status: AC
  Filled 2018-05-31: qty 2

## 2018-05-31 MED ORDER — CEFAZOLIN SODIUM-DEXTROSE 2-4 GM/100ML-% IV SOLN
2.0000 g | INTRAVENOUS | Status: AC
  Administered 2018-05-31: 2 g via INTRAVENOUS

## 2018-05-31 MED ORDER — SODIUM CHLORIDE 0.9 % IV SOLN
510.0000 mg | INTRAVENOUS | Status: AC
  Administered 2018-06-01: 510 mg via INTRAVENOUS
  Filled 2018-05-31 (×2): qty 17

## 2018-05-31 MED ORDER — HEPARIN SODIUM (PORCINE) 5000 UNIT/ML IJ SOLN: 5000.0000 [IU] | Freq: Three times a day (TID) | INTRAMUSCULAR | Status: DC

## 2018-05-31 MED ORDER — LIDOCAINE HCL 1 % IJ SOLN
INTRAMUSCULAR | Status: AC
  Filled 2018-05-31: qty 20

## 2018-05-31 MED ORDER — LORAZEPAM 2 MG/ML IJ SOLN
INTRAMUSCULAR | Status: AC | PRN
  Administered 2018-05-31: 1 mg via INTRAVENOUS

## 2018-05-31 MED ORDER — DARBEPOETIN ALFA 60 MCG/0.3ML IJ SOSY
60.0000 ug | PREFILLED_SYRINGE | INTRAMUSCULAR | Status: DC
  Administered 2018-06-01: 60 ug via INTRAVENOUS
  Filled 2018-05-31: qty 0.3

## 2018-05-31 MED ORDER — LIDOCAINE HCL 1 % IJ SOLN
INTRAMUSCULAR | Status: AC | PRN
  Administered 2018-05-31: 10 mL

## 2018-05-31 MED ORDER — FENTANYL CITRATE (PF) 100 MCG/2ML IJ SOLN
INTRAMUSCULAR | Status: AC | PRN
  Administered 2018-05-31: 25 ug via INTRAVENOUS

## 2018-05-31 MED ORDER — GELATIN ABSORBABLE 12-7 MM EX MISC
CUTANEOUS | Status: AC
  Filled 2018-05-31: qty 1

## 2018-05-31 NOTE — Progress Notes (Signed)
Patient ID: [name], female   DOB: 1955-05-16, 63 y.o.   MRN: [id number]  Planned for possible IR procedure today Keep npo Hold Hep inj  Will recheck INR

## 2018-05-31 NOTE — Progress Notes (Signed)
PROGRESS NOTE    [name]  IFO:[id number] DOB: Aug 02, 1955 DOA: 05/21/2018 PCP: Rosita Fire, MD      Brief Narrative:  [name] is a 63 y.o. F with mental disability in LTC, asthma well-controlled, CKD III Baseline Cr 1.3-1.6, HTN, and DM who presented with severe dyspnea requiring BiPAP by EMS.  On arrival in the ER was in V. tach, cardiology consulted and given amiodarone bolus, then developed unstable wide-complex tachycardia and acute respiratory failure.  Creatinine 4.3, potassium 7.5 on admission, pH 7.15, PCO2 45, lactic acid 3.2, chest x-ray pneumonia.  Temporizing potassium measures given, broad-spectrum antibiotics started, intubated and transferred to ICU at [facility].  Extubated 2/28 after 6 days.       Assessment & Plan:   Acute respiratory failure with hypoxia and hypercarbia Multifactorial from ventricular tachycardia, fluid overload from CHF and renal failure, pneumonia, asthma.  Extubated 2/28, stable on O2 by [place]. -Wean O2 as able   Postobstructive pneumonia Afebrile, HR normal, RR normal, taking orals, mentation back to baseline, but still requiring supplemental O2.   -Continue Augmentin, day 9 of 10 -Respiratory toilet  Carcinoid tumor of left lower lobe This is a typical carcinoid.  Discussed with oncology.  They will review, but suspect this is a slow-growing tumor, good prognosis. -Consult Oncology, appreciate expert cares  Acute renal failure on baseline CKD III Hyperkalemia Metabolic acidosis Lactic acidosis Creatinine 1.3-1.6 at baseline.  Now dialysis dependent.  Remains oliguric.   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  Blood cultures negative x4. ID were consulted, doubt culture negative endocarditis, but rather that this is either marantic or simply a calcified nodule.  Acute on chronic diastolic CHF Hypertension Appears  volume overloaded, has failed UOP with Lasix.  BP better -Continue aspirin, Isordil, metoprolol -Continue metoprolol, diltiazem -Hold home hydralazine, ARB  Ventricular tachycardia Junctional rhythm in setting of hyperkalemia Resolved -Continue metoprolol  History of asthma -Continue aformoterol and budesonide -Duonebs TID -Hold Trelegy from home, loratadine  Diabetes Glucose good -Continue sliding scale correction insulin -Hold home U500  Sleep apnea -BiPAP at night  Paroxysmal atrial fibrillation No previous diagnosis.  Post-bronchoscopy, developed an episode of self-limited Afib for <24 hours.  On 3/1, returned to Afib with RVR.  This did convert after 12 hours with diltiazem drip.    -Start diltiazem 120 mg daily -Will delay discussion of anticoagulation until [facility] placed, and Oncology treatment plan is discussed (resection of lesion or not)         MDM and disposition: The below labs and imaging reports are reviewed and summarized above.  The patient was admitted with respiratory failure from pneumonia and ventricular tachycardia failure.  She was intubated, subsequently found to have a lung mass, which turns out to be a carcinoid tumor.Oncology consulted  Unfortunately in the interim, she has had minimal recovery of renal function in the last 2 weeks, and remains on intermittent dialysis.  In discussions with her guardian/POA, [name], it is clear that patient would want aggressive and life-extending therapies, if offered, including dialysis, chemotherapy, lung resection, if these were offered.         DVT prophylaxis: Heparin Code Status: FULL Family Communication: Guardian by phone    Consultants:   CCM  Cardiology  Nephrology  IR  Procedures:   Intubation 2/22  Bronchoscopy 2/27  TEE 2/27  Extubation 2/28  Antimicrobials:   Vancomycin 2/22 >> 2/26  Cefepime 2/22 >> 2/23  Unasyn/Augmentin 2/24 >>      Subjective: No  dyspnea, chest pain, chest tightness, fever, confusion.  No vomiting, abdominal pain.  Objective: Vitals:   05/31/18 1525 05/31/18 1555 05/31/18 1625 05/31/18 1655  BP: (!) 181/83 (!) 172/85 (!) 188/85 (!) 184/86  Pulse: 72 71 72 74  Resp: _0 Temp:      TempSrc:      SpO2: 100% 98% 100% 98%  Weight:      Height:        Intake/Output Summary (Last 24 hours) at 05/31/2018 1719 Last data filed at 05/31/2018 1629 Gross per 24 hour  Intake 1005.02 ml  Output 25 ml  Net 980.02 ml   Filed Weights   05/30/18 1204 05/30/18 1620 05/31/18 0233  Weight: 123.9 kg 120.7 kg 119.3 kg    Examination: General appearance: Adult female, sitting in recliner, no acute distress, interactive HEENT: Anicteric, conjunctiva pink, lids and lashes normal. No nasal deformity, discharge, epistaxis.  Lips moist, dentition normal, oropharynx moist, no oral lesions, hearing normal.   Skin: Warm and dry.  No jaundice.  No suspicious rashes or lesions. Cardiac: Regular rate and rhythm, no murmurs, JVP not visible, pitting lower extremity edema bilaterally Respiratory: Normal respiratory rate and rhythm, lungs clear without rales or wheezes Abdomen: Abdomen soft without tenderness to palpation, ascites, or distention MSK: No deformities or effusions. Neuro: Awake and alert, extraocular movements intact, moves all extremities, speech fluent.    Psych: Sodium intact responding to questions, attention normal, affect normal, judgment and insight appear mildly impaired.     Data Reviewed: I have personally reviewed following labs and imaging studies:  CBC: Recent Labs  Lab 05/27/18 0711 05/30/18 0657 05/31/18 0609  WBC 7.2 7.2 6.2  HGB 7.5* 7.6* 7.8*  HCT 24.6* 26.2* 26.1*  MCV 92.5 94.6 93.9  PLT 194 235 481   Basic Metabolic Panel: Recent Labs  Lab 05/27/18 0427 05/28/18 0416 05/29/18 0234 05/30/18 0657 05/31/18 0609  NA 137 137 138 135 133*  K 4.4 3.8 4.2 4.4 3.6  CL 101 99 100 98 96*   CO2 _1 GLUCOSE 163* 119* 119* 137* 124*  BUN 55* 30* 48* 73* 38*  CREATININE 5.11* 3.44* 4.65* 5.79* 3.70*  CALCIUM 8.2* 8.1* 8.4* 8.1* 8.2*  PHOS 6.7* 6.3* 7.9* 10.4* 6.3*   GFR: Estimated Creatinine Clearance: 20 mL/min (A) (by C-G formula based on SCr of 3.7 mg/dL (H)). Liver Function Tests: Recent Labs  Lab 05/27/18 0427 05/28/18 0416 05/29/18 0234 05/30/18 0657 05/31/18 0609  ALBUMIN 2.2* 2.2* 2.4* 2.3* 2.4*   No results for input(s): LIPASE, AMYLASE in the last 168 hours. No results for input(s): AMMONIA in the last 168 hours. Coagulation Profile: Recent Labs  Lab 05/30/18 1015 05/31/18 0609  INR 1.7* 1.1   Cardiac Enzymes: No results for input(s): CKTOTAL, CKMB, CKMBINDEX, TROPONINI in the last 168 hours. BNP (last 3 results) No results for input(s): PROBNP in the last 8760 hours. HbA1C: No results for input(s): HGBA1C in the last 72 hours. CBG: Recent Labs  Lab 05/30/18 1716 05/30/18 2123 05/31/18 0746 05/31/18 1221 05/31/18 1710  GLUCAP 83 156* 119* 122* 109*   Lipid Profile: No results for input(s): CHOL, HDL, LDLCALC, TRIG, CHOLHDL, LDLDIRECT in the last 72 hours. Thyroid Function Tests: No results for input(s): TSH, T4TOTAL, FREET4, T3FREE, THYROIDAB in the last 72 hours. Anemia Panel: Recent Labs    05/31/18 0609  FERRITIN  266  TIBC 258  IRON 35   Urine analysis:    Component Value Date/Time   COLORURINE AMBER (A) 05/22/2018 0758   APPEARANCEUR CLOUDY (A) 05/22/2018 0758   LABSPEC 1.025 05/22/2018 0758   PHURINE 5.0 05/22/2018 0758   GLUCOSEU 50 (A) 05/22/2018 0758   HGBUR LARGE (A) 05/22/2018 0758   BILIRUBINUR NEGATIVE 05/22/2018 0758   KETONESUR 5 (A) 05/22/2018 0758   PROTEINUR 100 (A) 05/22/2018 0758   UROBILINOGEN 0.2 02/01/2013 1145   NITRITE NEGATIVE 05/22/2018 0758   LEUKOCYTESUR SMALL (A) 05/22/2018 0758   Sepsis Labs: _0 (procalcitonin:4,lacticacidven:4)  ) Recent Results (from the past 240  hour(s))  MRSA PCR Screening     Status: None   Collection Time: 05/21/18 11:32 PM  Result Value Ref Range Status   MRSA by PCR NEGATIVE NEGATIVE Final    Comment:        The GeneXpert MRSA Assay (FDA approved for NASAL specimens only), is one component of a comprehensive MRSA colonization surveillance program. It is not intended to diagnose MRSA infection nor to guide or monitor treatment for MRSA infections. Performed at [facility], [place] [address], [place], [place] [address]   Culture, blood (routine x 2)     Status: None   Collection Time: 05/24/18  1:04 PM  Result Value Ref Range Status   Specimen Description BLOOD RIGHT ANTECUBITAL  Final   Special Requests AEROBIC BOTTLE ONLY Blood Culture adequate volume  Final   Culture   Final    NO GROWTH 5 DAYS Performed at [facility], [place] [address] [place], [place] [address]    Report Status 05/29/2018 FINAL  Final  Culture, blood (routine x 2)     Status: None   Collection Time: 05/24/18  1:08 PM  Result Value Ref Range Status   Specimen Description BLOOD BLOOD RIGHT HAND  Final   Special Requests AEROBIC BOTTLE ONLY Blood Culture adequate volume  Final   Culture   Final    NO GROWTH 5 DAYS Performed at [facility], [place] [address] [place], [place] [address]    Report Status 05/29/2018 FINAL  Final  Culture, bal-quantitative     Status: Abnormal   Collection Time: 05/26/18 12:17 PM  Result Value Ref Range Status   Specimen Description BRONCHIAL ALVEOLAR LAVAGE  Final   Special Requests LLL  Final   Gram Stain   Final    ABUNDANT WBC PRESENT, PREDOMINANTLY PMN NO ORGANISMS SEEN    Culture (A)  Final    30,000 COLONIES/mL Consistent with normal respiratory flora. Performed at [facility], [place] [address], [place], [place] [address]    Report Status 05/28/2018 FINAL  Final         Radiology Studies: Ir Fluoro Guide Cv Line Right  Result Date: 05/31/2018 INDICATION: ACUTE RENAL  FAILURE, NO CURRENT PERMANENT ACCESS EXAM: ULTRASOUND GUIDANCE FOR VASCULAR ACCESS RIGHT INTERNAL JUGULAR PERMANENT HEMODIALYSIS CATHETER Date:  05/31/2018 05/31/2018 2:24 pm Radiologist:  Jerilynn Mages. Daryll Brod, MD Guidance:  ULTRASOUND AND FLUOROSCOPIC FLUOROSCOPY TIME:  Fluoroscopy Time: 0 minutes 18 seconds (5 mGy). MEDICATIONS: ANCEF 2 G ADMINISTERED WITHIN 1 HOUR OF THE PROCEDURE ANESTHESIA/SEDATION: Versed 1.0 mg IV; Fentanyl 25 mcg IV; Moderate Sedation Time:  21 minutes The patient was continuously monitored during the procedure by the interventional radiology nurse under my direct supervision. CONTRAST:  None. COMPLICATIONS: None immediate. PROCEDURE: Informed consent was obtained from the patient following explanation of the procedure, risks, benefits and  alternatives. The patient understands, agrees and consents for the procedure. All questions were addressed. A time out was performed. Maximal barrier sterile technique utilized including caps, mask, sterile gowns, sterile gloves, large sterile drape, hand hygiene, and 2% chlorhexidine scrub. Under sterile conditions and local anesthesia, right internal jugular micropuncture venous access was performed with ultrasound. Images were obtained for documentation of the patent right internal jugular vein. A guide wire was inserted followed by a transitional dilator. Next, a 0.035 guidewire was advanced into the IVC with a 5-French catheter. Measurements were obtained from the right venotomy site to the proximal right atrium. In the right infraclavicular chest, a subcutaneous tunnel was created under sterile conditions and local anesthesia. 1% lidocaine with epinephrine was utilized for this. The 19 cm tip to cuff dialysis catheter was tunneled subcutaneously to the venotomy site and inserted into the SVC/RA junction through a valved peel-away sheath. Position was confirmed with fluoroscopy. Images were obtained for documentation. Blood was aspirated from the catheter  followed by saline and heparin flushes. The appropriate volume and strength of heparin was instilled in each lumen. Caps were applied. The catheter was secured at the tunnel site with Gelfoam and a pursestring suture. The venotomy site was closed with subcuticular Vicryl suture. Dermabond was applied to the small right neck incision. A dry sterile dressing was applied. The catheter is ready for use. No immediate complications. IMPRESSION: Ultrasound and fluoroscopically guided right internal jugular tunneled hemodialysis catheter (19 cm tip to cuff dialysis catheter). Electronically Signed   By: Jerilynn Mages.  Shick M.D.   On: 05/31/2018 14:32   Ir US Guide Vasc Access Right  Result Date: 05/31/2018 INDICATION: ACUTE RENAL FAILURE, NO CURRENT PERMANENT ACCESS EXAM: ULTRASOUND GUIDANCE FOR VASCULAR ACCESS RIGHT INTERNAL JUGULAR PERMANENT HEMODIALYSIS CATHETER Date:  05/31/2018 05/31/2018 2:24 pm Radiologist:  M. Daryll Brod, MD Guidance:  ULTRASOUND AND FLUOROSCOPIC FLUOROSCOPY TIME:  Fluoroscopy Time: 0 minutes 18 seconds (5 mGy). MEDICATIONS: ANCEF 2 G ADMINISTERED WITHIN 1 HOUR OF THE PROCEDURE ANESTHESIA/SEDATION: Versed 1.0 mg IV; Fentanyl 25 mcg IV; Moderate Sedation Time:  21 minutes The patient was continuously monitored during the procedure by the interventional radiology nurse under my direct supervision. CONTRAST:  None. COMPLICATIONS: None immediate. PROCEDURE: Informed consent was obtained from the patient following explanation of the procedure, risks, benefits and alternatives. The patient understands, agrees and consents for the procedure. All questions were addressed. A time out was performed. Maximal barrier sterile technique utilized including caps, mask, sterile gowns, sterile gloves, large sterile drape, hand hygiene, and 2% chlorhexidine scrub. Under sterile conditions and local anesthesia, right internal jugular micropuncture venous access was performed with ultrasound. Images were obtained for  documentation of the patent right internal jugular vein. A guide wire was inserted followed by a transitional dilator. Next, a 0.035 guidewire was advanced into the IVC with a 5-French catheter. Measurements were obtained from the right venotomy site to the proximal right atrium. In the right infraclavicular chest, a subcutaneous tunnel was created under sterile conditions and local anesthesia. 1% lidocaine with epinephrine was utilized for this. The 19 cm tip to cuff dialysis catheter was tunneled subcutaneously to the venotomy site and inserted into the SVC/RA junction through a valved peel-away sheath. Position was confirmed with fluoroscopy. Images were obtained for documentation. Blood was aspirated from the catheter followed by saline and heparin flushes. The appropriate volume and strength of heparin was instilled in each lumen. Caps were applied. The catheter was secured at the tunnel site with Gelfoam and a  pursestring suture. The venotomy site was closed with subcuticular Vicryl suture. Dermabond was applied to the small right neck incision. A dry sterile dressing was applied. The catheter is ready for use. No immediate complications. IMPRESSION: Ultrasound and fluoroscopically guided right internal jugular tunneled hemodialysis catheter (19 cm tip to cuff dialysis catheter). Electronically Signed   By: Jerilynn Mages.  Shick M.D.   On: 05/31/2018 14:32        Scheduled Meds: . arformoterol  15 mcg Nebulization BID  . budesonide (PULMICORT) nebulizer solution  0.5 mg Nebulization BID  . Chlorhexidine Gluconate Cloth  6 each Topical Q0600  . [START ON 06/01/2018] darbepoetin (ARANESP) injection - DIALYSIS  60 mcg Intravenous Q Wed-HD  . diltiazem  120 mg Oral Daily  . feeding supplement (NEPRO CARB STEADY)  237 mL Oral BID BM  . feeding supplement (PRO-STAT SUGAR FREE 64)  30 mL Per Tube BID  . ferric citrate  420 mg Oral TID WC  . gelatin adsorbable      . heparin      . [START ON 06/01/2018] heparin   5,000 Units Subcutaneous Q8H  . insulin aspart  0-5 Units Subcutaneous QHS  . insulin aspart  0-9 Units Subcutaneous TID WC  . ipratropium-albuterol  3 mL Nebulization TID  . isosorbide dinitrate  10 mg Oral TID  . lidocaine      . metoprolol succinate  100 mg Oral Daily  . midazolam      . multivitamin  1 tablet Oral QHS   Continuous Infusions: . sodium chloride 10 mL/hr at 05/30/18 0830  . feeding supplement (VITAL AF 1.2 CAL) Stopped (05/27/18 1900)  . [START ON 06/01/2018] ferumoxytol       LOS: 10 days    Time spent: 25 minutes    Edwin Dada, MD [facility] 05/31/2018, 5:19 PM     Please page through [place]:  [email or web address] Password [email or web address] If 7PM-7AM, please contact night-coverage

## 2018-05-31 NOTE — Progress Notes (Addendum)
Nutrition Follow-up  DOCUMENTATION CODES:   Morbid obesity  INTERVENTION:    Nepro Shake po BID, each supplement provides 425 kcal and 19 grams protein  Renal multivitamin daily  NUTRITION DIAGNOSIS:   Inadequate oral intake related to acute illness, poor appetite as evidenced by meal completion < 50%.  Ongoing   GOAL:   Patient will meet greater than or equal to 90% of their needs  Progressing  MONITOR:   PO intake, Supplement acceptance, Labs, Skin  ASSESSMENT:   63 yo female with PMH of HTN, DM, anemia, CHF, mental handicap, asthma, arthritis who was admitted from [place] to [facility] with respiratory distress, V Tach; intubated and transferred to [facility] 2/22.  Extubated 2/28. TF off since extubation. S/P right IJ HD catheter placement today.  Next HD planned for tomorrow.  Patient has been consuming 0-75% of renal meals.  Intake is inadequate.  Labs and medications reviewed.  Sodium 133 (L), creatinine 3.7 (H), BUN 38 (H), phosphorus 6.3 (H) CBG's: 119-122 today   Diet Order:   Diet Order            Diet renal/carb modified with fluid restriction Diet-HS Snack? Nothing; Fluid restriction: 1200 mL Fluid; Room service appropriate? Yes; Fluid consistency: Thin  Diet effective now              EDUCATION NEEDS:   No education needs have been identified at this time  Skin:  Skin Assessment: Reviewed RN Assessment  Last BM:  3/2  Height:   Ht Readings from Last 1 Encounters:  05/21/18 5\' 4"  (1.626 m)    Weight:   Wt Readings from Last 1 Encounters:  05/31/18 119.3 kg    Ideal Body Weight:  54.5 kg  BMI:  Body mass index is 45.13 kg/m.  Estimated Nutritional Needs:   Kcal:  1700-1900  Protein:  105-120 gm  Fluid:  >/= 1.7 L    Molli Barrows, RD, LDN, CNSC Pager [phone] After Hours Pager [phone]

## 2018-05-31 NOTE — Progress Notes (Signed)
[facility] NEPHROLOGY PROGRESS NOTE  Assessment/ Plan: Pt is a 63 y.o. yo female with hypertension, diabetes, CKD 3 admitted with pneumonia, sepsis and possible aortic fall marantic lesion, AKI requiring CVVH in the beginning now on intermittent hemo-.  #AKI on CKD 3 in the setting of sepsis, concomitant ARB and possible cardiorenal syndrome.  Patient received CRRT from 2/22 and transition to intermittent hemo-on 2/24.  Patient remains oliguric and dialysis dependent.  C had dialysis yesterday and tolerated well.  Plan for tunneled catheter by IR today.  Patient still has bilateral leg edema.  Next dialysis tomorrow.  #Aortic valve vegetation concern for culture-negative versus marantic AV lesion: TEE was done.  #Hyperkalemia: Improved  #Acute on chronic diastolic CHF: Now managing volume with dialysis.  #Anemia of CKD: Iron saturation 14%.  I will order IV iron and ESA.  Monitor CBC.    #Hypertension:   Monitor blood pressure.  Continue current cardiac medication.  #Hyperphosphatemia: Follow-up PTH level.  Start [place].  Monitor phosphorus level.  Subjective: Seen and examined at bedside.  Doing well.  No new event.  Plan for IR procedure today.  Denied nausea, vomiting, chest pain or shortness of breath. Objective Vital signs in last 24 hours: Vitals:   05/31/18 0233 05/31/18 0444 05/31/18 0813 05/31/18 0935  BP:  (!) 166/77    Pulse:  69  75  Resp:  15  (!) 22  Temp:  (!) 97.4 F (36.3 C) (!) 97.3 F (36.3 C)   TempSrc:  Oral Oral   SpO2:  98%  100%  Weight: 119.3 kg     Height:       Weight change: 1.792 kg  Intake/Output Summary (Last 24 hours) at 05/31/2018 1149 Last data filed at 05/31/2018 0939 Gross per 24 hour  Intake 472 ml  Output 3025 ml  Net -2553 ml       Labs: Basic Metabolic Panel: Recent Labs  Lab 05/29/18 0234 05/30/18 0657 05/31/18 0609  NA 138 135 133*  K 4.2 4.4 3.6  CL 100 98 96*  CO2 26 26 27   GLUCOSE 119* 137* 124*   BUN 48* 73* 38*  CREATININE 4.65* 5.79* 3.70*  CALCIUM 8.4* 8.1* 8.2*  PHOS 7.9* 10.4* 6.3*   Liver Function Tests: Recent Labs  Lab 05/29/18 0234 05/30/18 0657 05/31/18 0609  ALBUMIN 2.4* 2.3* 2.4*   No results for input(s): LIPASE, AMYLASE in the last 168 hours. No results for input(s): AMMONIA in the last 168 hours. CBC: Recent Labs  Lab 05/27/18 0711 05/30/18 0657 05/31/18 0609  WBC 7.2 7.2 6.2  HGB 7.5* 7.6* 7.8*  HCT 24.6* 26.2* 26.1*  MCV 92.5 94.6 93.9  PLT 194 235 242   Cardiac Enzymes: No results for input(s): CKTOTAL, CKMB, CKMBINDEX, TROPONINI in the last 168 hours. CBG: Recent Labs  Lab 05/29/18 2131 05/30/18 0820 05/30/18 1716 05/30/18 2123 05/31/18 0746  GLUCAP 157* 123* 83 156* 119*    Iron Studies:  Recent Labs    05/31/18 0609  IRON 35  TIBC 258  FERRITIN 266   Studies/Results: No results found.  Medications: Infusions: . sodium chloride 10 mL/hr at 05/30/18 0830  .  ceFAZolin (ANCEF) IV    . feeding supplement (VITAL AF 1.2 CAL) Stopped (05/27/18 1900)    Scheduled Medications: . arformoterol  15 mcg Nebulization BID  . budesonide (PULMICORT) nebulizer solution  0.5 mg Nebulization BID  . diltiazem  120 mg Oral Daily  . feeding supplement (PRO-STAT SUGAR FREE 64)  30 mL Per Tube BID  . ferric citrate  420 mg Oral TID WC  . [START ON 06/01/2018] heparin  5,000 Units Subcutaneous Q8H  . insulin aspart  0-5 Units Subcutaneous QHS  . insulin aspart  0-9 Units Subcutaneous TID WC  . ipratropium-albuterol  3 mL Nebulization TID  . isosorbide dinitrate  10 mg Oral TID  . metoprolol succinate  100 mg Oral Daily    have reviewed scheduled and prn medications.  Physical Exam: General:NAD, comfortable Heart:RRR, s1s2 nl, no rubs Lungs: Rare bilateral, no wheezing Abdomen:soft, Non-tender, non-distended Extremities: Lower extremity pitting edema+ Dialysis Access: temp IJ catheter  Tayja Manzer Prasad Erubiel Manasco 05/31/2018,11:49 AM  LOS: 10  days

## 2018-05-31 NOTE — Consult Note (Signed)
Consultation Note Date: 05/31/2018   Patient Name: [name]  DOB: [birth year]  MRN: [id number]  Age / Sex: 63 y.o., female  PCP: Anne Fire, MD Referring Physician: Edwin [name], *  Reason for Consultation: Establishing goals of care  HPI/Patient Profile: 62 y.o. female  admitted on 05/21/2018 from [name] with complaints of respiratory distress. She has a past medical history of mental disability, hypertension, asthma, chronic kidney disease stage III, diabetes, sleep apnea, and diastolic congestive heart failure. Patient has a legal guardian. On presentation she was found to have wife complex tachycardia and required intubation. WBC 11.8, Hgb 8.2, NA 129, K+ 7.0, BUN 89, Cr 4.65. Chest x-ray showed increasing left perihilar and lower lobe opacity. CT of chest on previous admission (2/14) showed a mass measuring 5x8.2 in left lower lobe. Since admission patient has been successfully extubated (2/28), she required CRRT initially and has began on intermittent HD. She remains oliguric and dialysis dependent. Biopsy of lung mass showed typical carcinoid and [name] has been consulted. Palliative Medicine team consulted for goals of care.   Clinical Assessment and Goals of Care: I have reviewed medical records including lab results, imaging, Epic notes, and MAR, received report from the bedside RN, and assessed the patient. I spoke with patient's legal guardian, [name] to discuss diagnosis prognosis, [name], EOL wishes, disposition and options. Patient is sleepy s/p IJ catheter placement for HD. She is easily aroused and follows commands. Some mild impairment noted.   I introduced Palliative Medicine as specialized medical care for people living with serious illness. It focuses on providing relief from the symptoms and stress of a serious illness. The goal is to improve quality of life for both  the patient and the family.  Patient has been under [name] guardianship for for years. Patient has a mental disability. Prior to admission patient required assistance with ADLs. She is able to assist and feed herself. She was ambulatory with a rolling walker prior to admission. No concerns with appetite.   We discussed her current illness and what it means in the larger context of her on-going co-morbidities.  Natural disease trajectory and expectations at EOL were discussed. Legal guardian verbalized understanding of current illness.   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 [name] reports she and her supervisor would also like to have input from [name] and [name] before they intervened and made any changes in regards to her Code status or plan of care. Support given. [name] made aware patient has a referral pending for [name] to see her.   I attempted to elicit values and goals of care important to the patient.    The difference between aggressive medical intervention and comfort care was considered in light of the patient's goals of care. At this time legal guardian confirmed to continue with aggressive medical interventions including CPR, intubation, HD, and if chemotherapy is recommended to allow patient a fighting chance. [name] expressed if patient continued to decline  or prognosis is told by specialty providers that she is terminal they will proceed with a team meeting to further discuss goals.    Hospice and Palliative Care services outpatient were explained and offered. Given expressed goals are to continue with aggressive medical intervention, advised outpatient Palliative at minimum would be of benefit for patient. [name] verbalized understanding and agreement. She reported the goal is for patient to discharge once stable to rehab/SNF with hopes of returning to [name] facility.    Questions and concerns were addressed. The  family was encouraged to call with questions or concerns.  PMT will continue to support holistically.   Primary Decision Maker: LEGAL GUARDIAN/[name]   SUMMARY OF RECOMMENDATIONS    FULL CODE  Continue to treat per attending  Guardian reports she will present patient's case in their meeting tomorrow morning in regards to Code status and goals of care. She expressed current plan is for patient to go to SNF for rehab and hopefully return to [name] afterwards, however, she would also like to wait and here updates and recommendations from [name] and further updates from [name]. Depending on updates [name] team will consider DNR/DNI and completion of [name] form.   Legal guardian is aware patient [name] likely is not a candidate for long-term HD. Remains hopeful this will not be a requirement.   Outpatient Palliative at discharge at minimum.  PMT will continue to follow and support.   Code Status/Advance Care Planning:  Full code   Symptom Management:   Per attending   Palliative Prophylaxis:   Aspiration, Bowel Regimen, Delirium Protocol, Frequent Pain Assessment and Turn Reposition  Additional Recommendations (Limitations, Scope, Preferences):  Full Scope Treatment  Psycho-social/Spiritual:   Desire for further Chaplaincy support:NO  Prognosis:   Guarded to Poor in the setting of mental disability, asthma, recent V.Tach requiring intubation, s/p extubation, acute respiratory failure, postobstructive pneumonia, left lower lobe lung carcinoid, acute on chronic renal failure requiring dialysis, not a long-term candidate, endocarditis, acute on chronic diastolic CHF, hypertension, diabetes, and atrial fibrillation.   Discharge Planning: Per Legal guardian plan is for SNF with outpatient palliative with goal of returning back to [name]      Primary Diagnoses: Present on Admission: . Respiratory arrest ([place]) . Acute renal failure (ARF) ([place])   I have reviewed  the medical record, interviewed the patient and family, and examined the patient. The following aspects are pertinent.  Past Medical History:  Diagnosis Date  . Arthritis   . Asthma   . CHF (congestive heart failure) ([place])   . Diabetes mellitus without complication ([place])   . Hypertension   . Iron deficiency anemia 10/16/2010  . Mental handicap 10/16/2010   Social History   Socioeconomic History  . Marital status: Divorced    Spouse name: Not on file  . Number of children: 1  . Years of education: Not on file  . Highest education level: Not on file  Occupational History    Employer: UNEMPLOYED  Social Needs  . Financial resource strain: Not on file  . Food insecurity:    Worry: Not on file    Inability: Not on file  . Transportation needs:    Medical: Not on file    Non-medical: Not on file  Tobacco Use  . Smoking status: Former Smoker    Packs/day: 1.00    Years: 0.00    Pack years: 0.00    Types: Cigarettes    Last attempt to quit: 04/11/1996  Years since quitting: 22.1  . Smokeless tobacco: Never Used  . Tobacco comment: quit a couple year ago  Substance and Sexual Activity  . Alcohol use: No  . Drug use: No  . Sexual activity: Never  Lifestyle  . Physical activity:    Days per week: Not on file    Minutes per session: Not on file  . Stress: Not on file  Relationships  . Social connections:    Talks on phone: Not on file    Gets together: Not on file    Attends religious service: Not on file    Active member of club or organization: Not on file    Attends meetings of clubs or organizations: Not on file    Relationship status: Not on file  Other Topics Concern  . Not on file  Social History Narrative   Lives with parents.    Family History  Problem Relation Age of Onset  . Diabetes Mother   . Hypertension Mother   . Hypertension Father   . Kidney disease Other        son reportedly has had cyst on kidney and lung s/p surgery at [facility]  .  Hypertension Son   . Colon cancer Neg Hx    Scheduled Meds: . arformoterol  15 mcg Nebulization BID  . budesonide (PULMICORT) nebulizer solution  0.5 mg Nebulization BID  . Chlorhexidine Gluconate Cloth  6 each Topical Q0600  . [START ON 06/01/2018] darbepoetin (ARANESP) injection - DIALYSIS  60 mcg Intravenous Q Wed-HD  . diltiazem  120 mg Oral Daily  . feeding supplement (PRO-STAT SUGAR FREE 64)  30 mL Per Tube BID  . ferric citrate  420 mg Oral TID WC  . gelatin adsorbable      . heparin      . [START ON 06/01/2018] heparin  5,000 Units Subcutaneous Q8H  . insulin aspart  0-5 Units Subcutaneous QHS  . insulin aspart  0-9 Units Subcutaneous TID WC  . ipratropium-albuterol  3 mL Nebulization TID  . isosorbide dinitrate  10 mg Oral TID  . lidocaine      . metoprolol succinate  100 mg Oral Daily  . midazolam       Continuous Infusions: . sodium chloride 10 mL/hr at 05/30/18 0830  . feeding supplement (VITAL AF 1.2 CAL) Stopped (05/27/18 1900)  . [START ON 06/01/2018] ferumoxytol     PRN Meds:.sodium chloride, diphenhydrAMINE, hydrALAZINE Medications Prior to Admission:  Prior to Admission medications   Medication Sig Start Date End Date Taking? Authorizing Provider  acetaminophen (TYLENOL) 500 MG tablet Take 500 mg by mouth 2 (two) times daily as needed for mild pain.   Yes [provider]  albuterol (PROVENTIL HFA;VENTOLIN HFA) 108 (90 Base) MCG/ACT inhaler Inhale 2 puffs into the lungs every 6 (six) hours as needed for shortness of breath.   Yes [provider]  amoxicillin-clavulanate (AUGMENTIN) 875-125 MG tablet Take 1 tablet by mouth 2 (two) times daily. Started on 05-20-18 DS 15 05/20/18  Yes [provider]  aspirin EC 81 MG tablet Take 81 mg by mouth daily.   Yes [provider]  Choline Fenofibrate (FENOFIBRIC ACID) 135 MG CPDR Take 1 capsule by mouth daily.   Yes [provider]  Diphenhyd-Hydrocort-Nystatin (FIRST-DUKES MOUTHWASH)  SUSP Use as directed 5 mLs in the mouth or throat 4 (four) times daily. Swish and spit   Yes [provider]  fluticasone (VERAMYST) 27.5 MCG/SPRAY nasal spray Place 2 sprays into  the nose daily.   Yes [provider]  Fluticasone-Umeclidin-Vilant (TRELEGY ELLIPTA) 100-62.5-25 MCG/INH AEPB Inhale into the lungs daily.   Yes [provider]  furosemide (LASIX) 40 MG tablet Take 40 mg by mouth daily.   Yes [provider]  guaiFENesin (ROBITUSSIN) 100 MG/5ML SOLN Take 5 mLs (100 mg total) by mouth every 4 (four) hours as needed for cough or to loosen phlegm. 05/16/18  Yes Shah, Pratik D, DO  hydrALAZINE (APRESOLINE) 25 MG tablet Take 25 mg by mouth 3 (three) times daily.   Yes [provider]  insulin regular human CONCENTRATED (HUMULIN R U-500 KWIKPEN) 500 UNIT/ML kwikpen Inject 20 Units into the skin 3 (three) times daily with meals. Inject 40 units with breakfast, 40 units with lunch, and 40 units with supper when blood glucose before food is greater than 90 mg/dL Patient taking differently: Inject 40 Units into the skin 3 (three) times daily before meals. Inject 40 units w food if BS greater than 90 mg/dL 05/16/18  Yes Shah, Pratik D, DO  ipratropium-albuterol (DUONEB) 0.5-2.5 (3) MG/3ML SOLN Take 3 mLs by nebulization every 6 (six) hours as needed (shortness of breath).   Yes [provider]  isosorbide dinitrate (ISORDIL) 20 MG tablet Take 20 mg by mouth 3 (three) times daily.   Yes [provider]  loratadine (CLARITIN) 10 MG tablet Take 10 mg by mouth daily.   Yes [provider]  losartan (COZAAR) 100 MG tablet Take 100 mg by mouth daily.  05/09/18  Yes [provider]  methylPREDNISolone (MEDROL DOSEPAK) 4 MG TBPK tablet Take 4 mg by mouth as directed. On package 05/20/18  Yes [provider]  potassium chloride SA (K-DUR,KLOR-CON) 20 MEQ tablet Take 20 mEq by mouth daily.  11/10/17  Yes [provider]   triamcinolone ointment (KENALOG) 0.5 % Apply 1 application topically 2 (two) times daily as needed (rash).   Yes [provider]  ACCU-CHEK AVIVA PLUS test strip USE TO TEST THREE TIMES DAILY. 02/23/18   Cassandria Anger, MD  ACCU-CHEK SOFTCLIX LANCETS lancets USE TO TEST THREE TIMES DAILY. 02/23/18   Cassandria Anger, MD  NOVOFINE AUTOCOVER 30G X 8 MM MISC USE AS DIRECTED FOR 3 TIMES DAILY INSULIN ADMINISTRATION. 04/04/18   Nida, Marella Chimes, MD  SURE COMFORT PEN NEEDLES 31G X 8 MM MISC USE AS DIRECTED WITH LEVEMIR AND NOVOLOG. UP TO FOUR TIMES DAILY. 02/12/17   Cassandria Anger, MD   No Known Allergies Review of Systems  Constitutional: Positive for fatigue.  Neurological: Positive for weakness.  All other systems reviewed and are negative.   Physical Exam Vitals signs and nursing note reviewed.  Constitutional:      Appearance: She is ill-appearing.     Comments: Chronically ill appearing   Cardiovascular:     Rate and Rhythm: Normal rate.     Pulses: Normal pulses.     Heart sounds: Normal heart sounds.  Pulmonary:     Effort: Pulmonary effort is normal.     Breath sounds: Decreased breath sounds present.  Abdominal:     General: Bowel sounds are normal.     Palpations: Abdomen is soft.  Skin:    General: Skin is warm and dry.  Neurological:     Mental Status: She is alert and easily aroused.     Motor: Weakness present.     Comments: Sleeping s/p IJ placement   Psychiatric:        Mood and  Affect: Mood normal.        Speech: Speech normal.        Behavior: Behavior is cooperative.        Thought Content: Thought content normal.        Cognition and Memory: Cognition is impaired.     Vital Signs: BP (!) 174/91   Pulse 76   Temp (!) 97.5 F (36.4 C) (Oral)   Resp 20   Ht 5\' 4"  (1.626 m)   Wt 119.3 kg   SpO2 98%   BMI 45.13 kg/m  Pain Scale: (Procedural Areas Only) Assessment interferes with procedure   Pain Score: 0-No  pain   SpO2: SpO2: 98 % O2 Device:SpO2: 98 % O2 Flow Rate: .O2 Flow Rate (L/min): 3 L/min  IO: Intake/output summary:   Intake/Output Summary (Last 24 hours) at 05/31/2018 1546 Last data filed at 05/31/2018 1412 Gross per 24 hour  Intake 572 ml  Output 3025 ml  Net -2453 ml    LBM: Last BM Date: 05/30/18 Baseline Weight: Weight: 115.6 kg [name] recent weight: Weight: 119.3 kg     Palliative Assessment/Data: PPS 30%   Flowsheet Rows     [name] Recent Value  Intake Tab  Referral Department  Hospitalist  Unit at Time of Referral  Med/Surg Unit  Palliative Care Primary Diagnosis  Cardiac  Date Notified  05/29/18  Palliative Care Type  New Palliative care  Reason for referral  Clarify Goals of Care  Date of Admission  05/21/18  # of days IP prior to Palliative referral  8  Clinical Assessment  Psychosocial & Spiritual Assessment  Palliative Care Outcomes      Time In: 1500 Time Out: 1615 Time Total: 75 min  Greater than 50%  of this time was spent counseling and coordinating care related to the above assessment and plan.  Signed by:  Alda Lea, AGPCNP-BC Palliative Medicine Team  Phone: [phone] Fax: [phone] Pager: [phone] Amion: Bjorn Pippin    Please contact Palliative Medicine Team phone at [phone] for questions and concerns.  For individual provider: See Shea Evans

## 2018-05-31 NOTE — Progress Notes (Signed)
PT Cancellation Note  Patient Details Name: [name] MRN: [id number] DOB: [birth year]   Cancelled Treatment:    Reason Eval/Treat Not Completed: Fatigue/lethargy limiting ability to participate. Pt declining secondary to fatigue s/p RIJ HD Cath placement today. Also with HTN (RN aware and administering meds). Will follow-up for PT treatment tomorrow.  Mabeline Caras, PT, DPT Acute Rehabilitation Services  Pager [phone] Office [place] 05/31/2018, 4:38 PM

## 2018-05-31 NOTE — Procedures (Signed)
esrd  S/P RT IJ HD CATH   TIP SVCRA No comp Stable ebl min Ready for use Full report in pacs

## 2018-06-01 ENCOUNTER — Ambulatory Visit: Payer: Medicare Other | Admitting: Orthopaedic Surgery

## 2018-06-01 ENCOUNTER — Other Ambulatory Visit: Payer: Self-pay

## 2018-06-01 ENCOUNTER — Encounter (HOSPITAL_COMMUNITY): Payer: Self-pay | Admitting: Physician Assistant

## 2018-06-01 DIAGNOSIS — I509 Heart failure, unspecified: Secondary | ICD-10-CM

## 2018-06-01 DIAGNOSIS — D3A090 Benign carcinoid tumor of the bronchus and lung: Secondary | ICD-10-CM

## 2018-06-01 DIAGNOSIS — Z992 Dependence on renal dialysis: Secondary | ICD-10-CM

## 2018-06-01 DIAGNOSIS — I480 Paroxysmal atrial fibrillation: Secondary | ICD-10-CM

## 2018-06-01 DIAGNOSIS — I5033 Acute on chronic diastolic (congestive) heart failure: Secondary | ICD-10-CM

## 2018-06-01 DIAGNOSIS — N186 End stage renal disease: Secondary | ICD-10-CM

## 2018-06-01 LAB — CBC
HCT: 27.6 % — ABNORMAL LOW (ref 36.0–46.0)
HCT: 29.9 % — ABNORMAL LOW (ref 36.0–46.0)
Hemoglobin: 8.3 g/dL — ABNORMAL LOW (ref 12.0–15.0)
Hemoglobin: 9.3 g/dL — ABNORMAL LOW (ref 12.0–15.0)
MCH: 27.5 pg (ref 26.0–34.0)
MCH: 28.3 pg (ref 26.0–34.0)
MCHC: 30.1 g/dL (ref 30.0–36.0)
MCHC: 31.1 g/dL (ref 30.0–36.0)
MCV: 91.4 fL (ref 80.0–100.0)
MCV: 90.9 fL (ref 80.0–100.0)
Platelets: 281 10*3/uL (ref 150–400)
Platelets: 303 10*3/uL (ref 150–400)
RBC: 3.02 MIL/uL — ABNORMAL LOW (ref 3.87–5.11)
RBC: 3.29 MIL/uL — ABNORMAL LOW (ref 3.87–5.11)
RDW: 13.7 % (ref 11.5–15.5)
RDW: 13.7 % (ref 11.5–15.5)
WBC: 7.6 10*3/uL (ref 4.0–10.5)
WBC: 6.8 10*3/uL (ref 4.0–10.5)
nRBC: 0 % (ref 0.0–0.2)
nRBC: 0 % (ref 0.0–0.2)

## 2018-06-01 LAB — RENAL FUNCTION PANEL
Albumin: 2.4 g/dL — ABNORMAL LOW (ref 3.5–5.0)
Albumin: 2.6 g/dL — ABNORMAL LOW (ref 3.5–5.0)
Anion gap: 14 (ref 5–15)
Anion gap: 11 (ref 5–15)
BUN: 55 mg/dL — ABNORMAL HIGH (ref 8–23)
BUN: 16 mg/dL (ref 8–23)
CO2: 24 mmol/L (ref 22–32)
CO2: 27 mmol/L (ref 22–32)
Calcium: 8.2 mg/dL — ABNORMAL LOW (ref 8.9–10.3)
Calcium: 8 mg/dL — ABNORMAL LOW (ref 8.9–10.3)
Chloride: 96 mmol/L — ABNORMAL LOW (ref 98–111)
Chloride: 98 mmol/L (ref 98–111)
Creatinine, Ser: 4.44 mg/dL — ABNORMAL HIGH (ref 0.44–1.00)
Creatinine, Ser: 2 mg/dL — ABNORMAL HIGH (ref 0.44–1.00)
GFR calc Af Amer: 12 mL/min — ABNORMAL LOW (ref >60)
GFR calc Af Amer: 30 mL/min — ABNORMAL LOW (ref >60)
GFR calc non Af Amer: 10 mL/min — ABNORMAL LOW (ref >60)
GFR calc non Af Amer: 26 mL/min — ABNORMAL LOW (ref >60)
Glucose, Bld: 137 mg/dL — ABNORMAL HIGH (ref 70–99)
Glucose, Bld: 117 mg/dL — ABNORMAL HIGH (ref 70–99)
Phosphorus: 6.8 mg/dL — ABNORMAL HIGH (ref 2.5–4.6)
Phosphorus: 2.6 mg/dL (ref 2.5–4.6)
Potassium: 3.7 mmol/L (ref 3.5–5.1)
Potassium: 3.6 mmol/L (ref 3.5–5.1)
Sodium: 134 mmol/L — ABNORMAL LOW (ref 135–145)
Sodium: 136 mmol/L (ref 135–145)

## 2018-06-01 LAB — GLUCOSE, CAPILLARY
Glucose-Capillary: 128 mg/dL — ABNORMAL HIGH (ref 70–99)
Glucose-Capillary: 108 mg/dL — ABNORMAL HIGH (ref 70–99)
Glucose-Capillary: 355 mg/dL — ABNORMAL HIGH (ref 70–99)
Glucose-Capillary: 177 mg/dL — ABNORMAL HIGH (ref 70–99)

## 2018-06-01 LAB — T4, FREE: Free T4: 1.35 ng/dL (ref 0.82–1.77)

## 2018-06-01 LAB — TSH: TSH: 3.292 u[IU]/mL (ref 0.350–4.500)

## 2018-06-01 LAB — PTH, INTACT AND CALCIUM
Calcium, Total (PTH): 7.8 mg/dL — ABNORMAL LOW (ref 8.7–10.3)
PTH: 143 pg/mL — ABNORMAL HIGH (ref 15–65)

## 2018-06-01 MED ORDER — DILTIAZEM HCL 25 MG/5ML IV SOLN
10.0000 mg | Freq: Once | INTRAVENOUS | Status: AC
  Administered 2018-06-01: 10 mg via INTRAVENOUS
  Filled 2018-06-01: qty 5

## 2018-06-01 MED ORDER — LIDOCAINE-PRILOCAINE 2.5-2.5 % EX CREA: 1.0000 "application " | TOPICAL_CREAM | CUTANEOUS | Status: DC | PRN

## 2018-06-01 MED ORDER — HEPARIN SODIUM (PORCINE) 1000 UNIT/ML DIALYSIS
20.0000 [IU]/kg | INTRAMUSCULAR | Status: DC | PRN
  Filled 2018-06-01: qty 3

## 2018-06-01 MED ORDER — HEPARIN SODIUM (PORCINE) 1000 UNIT/ML DIALYSIS: 1000.0000 [IU] | INTRAMUSCULAR | Status: DC | PRN

## 2018-06-01 MED ORDER — LIDOCAINE HCL (PF) 1 % IJ SOLN: 5.0000 mL | INTRAMUSCULAR | Status: DC | PRN

## 2018-06-01 MED ORDER — HEPARIN SODIUM (PORCINE) 1000 UNIT/ML IJ SOLN
INTRAMUSCULAR | Status: AC
  Administered 2018-06-01: 1000 [IU] via INTRAVENOUS_CENTRAL
  Filled 2018-06-01: qty 4

## 2018-06-01 MED ORDER — DILTIAZEM HCL ER COATED BEADS 240 MG PO CP24
240.0000 mg | ORAL_CAPSULE | Freq: Every day | ORAL | Status: DC
  Administered 2018-06-01 – 2018-06-09 (×9): 240 mg via ORAL
  Filled 2018-06-01 (×9): qty 1

## 2018-06-01 MED ORDER — SODIUM CHLORIDE 0.9 % IV SOLN: 100.0000 mL | INTRAVENOUS | Status: DC | PRN

## 2018-06-01 MED ORDER — PENTAFLUOROPROP-TETRAFLUOROETH EX AERO: 1.0000 "application " | INHALATION_SPRAY | CUTANEOUS | Status: DC | PRN

## 2018-06-01 MED ORDER — SODIUM CHLORIDE 0.9 % IV SOLN
125.0000 mg | INTRAVENOUS | Status: DC
  Administered 2018-06-03 – 2018-06-07 (×2): 125 mg via INTRAVENOUS
  Filled 2018-06-01 (×3): qty 10

## 2018-06-01 MED ORDER — DARBEPOETIN ALFA 60 MCG/0.3ML IJ SOSY
PREFILLED_SYRINGE | INTRAMUSCULAR | Status: AC
  Administered 2018-06-01: 60 ug via INTRAVENOUS
  Filled 2018-06-01: qty 0.3

## 2018-06-01 MED ORDER — ALTEPLASE 2 MG IJ SOLR: 2.0000 mg | Freq: Once | INTRAMUSCULAR | Status: DC | PRN

## 2018-06-01 MED ORDER — HEPARIN SODIUM (PORCINE) 1000 UNIT/ML DIALYSIS
1000.0000 [IU] | INTRAMUSCULAR | Status: DC | PRN
  Administered 2018-06-01: 1000 [IU] via INTRAVENOUS_CENTRAL
  Filled 2018-06-01: qty 1

## 2018-06-01 MED ORDER — HEPARIN BOLUS VIA INFUSION
4000.0000 [IU] | Freq: Once | INTRAVENOUS | Status: AC
  Administered 2018-06-01: 4000 [IU] via INTRAVENOUS
  Filled 2018-06-01: qty 4000

## 2018-06-01 MED ORDER — HEPARIN (PORCINE) 25000 UT/250ML-% IV SOLN
2100.0000 [IU]/h | INTRAVENOUS | Status: DC
  Administered 2018-06-01: 1150 [IU]/h via INTRAVENOUS
  Administered 2018-06-02: 1750 [IU]/h via INTRAVENOUS
  Administered 2018-06-02: 1450 [IU]/h via INTRAVENOUS
  Administered 2018-06-03 – 2018-06-06 (×6): 2100 [IU]/h via INTRAVENOUS
  Filled 2018-06-01 (×10): qty 250

## 2018-06-01 NOTE — Consult Note (Signed)
[facility] [place]  Patient name: [name] MRN: [id number] DOB: 07-May-1955 Sex: female  REASON FOR CONSULT: Discuss access for permanent hemodialysis  HPI: [name] is a 63 y.o. female, who is seen for discussion of permanent hemodialysis.  She has multiple medical problems as listed below.  She is presented with progression from renal insufficiency to end-stage renal disease and has initiated dialysis via her tunneled catheter placed by interventional radiology.  We are seeing her for permanent access.  She has vein mapping pending.  She reports that she is left-handed.  She does not have a pacemaker.  She has not had a mastectomy.  Past Medical History:  Diagnosis Date  . Arthritis   . Asthma   . Chronic diastolic CHF (congestive heart failure) ([place])   . CKD (chronic kidney disease), stage III ([place])   . Diabetes mellitus type 2 in obese ([place])   . Hypertension   . Iron deficiency anemia 10/16/2010  . Mental handicap 10/16/2010  . Mild CAD 2013  . Morbid obesity ([place])   . OSA (obstructive sleep apnea)     Family History  Problem Relation Age of Onset  . Diabetes Mother   . Hypertension Mother   . Heart failure Mother   . Hypertension Father   . Atrial fibrillation Father   . Kidney disease Other        son reportedly has had cyst on kidney and lung s/p surgery at [facility]  . Hypertension Son   . Colon cancer Neg Hx     SOCIAL HISTORY: Social History   Socioeconomic History  . Marital status: Divorced    Spouse name: Not on file  . Number of children: 1  . Years of education: Not on file  . Highest education level: Not on file  Occupational History    Employer: UNEMPLOYED  Social Needs  . Financial resource strain: Not on file  . Food insecurity:    Worry: Not on file    Inability: Not on file  . Transportation needs:    Medical: Not on file    Non-medical: Not on file  Tobacco Use  .  Smoking status: Former Smoker    Packs/day: 1.00    Years: 0.00    Pack years: 0.00    Types: Cigarettes    Last attempt to quit: 04/11/1996    Years since quitting: 22.1  . Smokeless tobacco: Never Used  . Tobacco comment: quit a couple year ago  Substance and Sexual Activity  . Alcohol use: No  . Drug use: No  . Sexual activity: Never  Lifestyle  . Physical activity:    Days per week: Not on file    Minutes per session: Not on file  . Stress: Not on file  Relationships  . Social connections:    Talks on phone: Not on file    Gets together: Not on file    Attends religious service: Not on file    Active member of club or organization: Not on file    Attends meetings of clubs or organizations: Not on file    Relationship status: Not on file  . Intimate partner violence:    Fear of current or ex partner: Not on file    Emotionally abused: Not on file    Physically abused: Not on file    Forced sexual activity: Not on file  Other Topics Concern  . Not on file  Social History Narrative  Lives with parents.     No Known Allergies  Current Facility-Administered Medications  Medication Dose Route Frequency Provider Last Rate Last Dose  . 0.9 %  sodium chloride infusion   Intravenous PRN Milagros Loll, MD 10 mL/hr at 05/30/18 0830    . arformoterol (BROVANA) nebulizer solution 15 mcg  15 mcg Nebulization BID Omar Person, NP   15 mcg at 05/31/18 2214  . budesonide (PULMICORT) nebulizer solution 0.5 mg  0.5 mg Nebulization BID Hayden Pedro M, NP   0.5 mg at 05/31/18 2214  . Chlorhexidine Gluconate Cloth 2 % PADS 6 each  6 each Topical Q0600 Rosita Fire, MD   6 each at 06/01/18 0645  . Darbepoetin Alfa (ARANESP) injection 60 mcg  60 mcg Intravenous Q Wed-HD Rosita Fire, MD   60 mcg at 06/01/18 0944  . diltiazem (CARDIZEM CD) 24 hr capsule 240 mg  240 mg Oral Daily Kirby-Graham, Karsten Fells, NP   240 mg at 06/01/18 1200  . diphenhydrAMINE  (BENADRYL) injection 12.5 mg  12.5 mg Intravenous Q6H PRN Anders Simmonds, MD   12.5 mg at 05/24/18 0230  . feeding supplement (NEPRO CARB STEADY) liquid 237 mL  237 mL Oral BID BM Danford, Suann Larry, MD   237 mL at 06/01/18 1201  . feeding supplement (PRO-STAT SUGAR FREE 64) liquid 30 mL  30 mL Per Tube BID Icard, Bradley L, DO   30 mL at 06/01/18 1201  . feeding supplement (VITAL AF 1.2 CAL) liquid 1,000 mL  1,000 mL Per Tube Continuous Icard, Bradley L, DO   Stopped at 05/27/18 1900  . ferric citrate (AURYXIA) tablet 420 mg  420 mg Oral TID WC Rosita Fire, MD   420 mg at 06/01/18 1200  . [START ON 06/03/2018] ferric gluconate (NULECIT) 125 mg in sodium chloride 0.9 % 100 mL IVPB  125 mg Intravenous Q M,W,F-HD Rosita Fire, MD      . heparin ADULT infusion 100 units/mL (25000 units/262mL sodium chloride 0.45%)  1,150 Units/hr Intravenous Continuous Mikhail, [place], DO      . heparin bolus via infusion 4,000 Units  4,000 Units Intravenous Once Cristal Ford, DO      . heparin injection 2,500 Units  20 Units/kg Dialysis PRN Donato Heinz, MD      . heparin injection 2,500 Units  20 Units/kg Dialysis PRN Donato Heinz, MD      . hydrALAZINE (APRESOLINE) injection 10 mg  10 mg Intravenous Q4H PRN Deterding, Guadelupe Sabin, MD   10 mg at 05/31/18 2126  . insulin aspart (novoLOG) injection 0-5 Units  0-5 Units Subcutaneous QHS Danford, Christopher P, MD      . insulin aspart (novoLOG) injection 0-9 Units  0-9 Units Subcutaneous TID WC Edwin Dada, MD   2 Units at 05/31/18 1237  . ipratropium-albuterol (DUONEB) 0.5-2.5 (3) MG/3ML nebulizer solution 3 mL  3 mL Nebulization TID Omar Person, NP   3 mL at 06/01/18 1437  . isosorbide dinitrate (ISORDIL) tablet 10 mg  10 mg Oral TID Edwin Dada, MD   10 mg at 06/01/18 1201  . metoprolol succinate (TOPROL-XL) 24 hr tablet 100 mg  100 mg Oral Daily Edwin Dada, MD   100 mg at 06/01/18 1201    . multivitamin (RENA-VIT) tablet 1 tablet  1 tablet Oral QHS Edwin Dada, MD   1 tablet at 05/31/18 2224    REVIEW OF SYSTEMS:  Reviewed in her history and  physical with nothing to add  PHYSICAL EXAM: Vitals:   06/01/18 1100 06/01/18 1115 06/01/18 1200 06/01/18 1438  BP: (!) 141/69 (!) 158/77 (!) 160/77   Pulse: 78 77 81   Resp:  18 19   Temp:  97.6 F (36.4 C)    TempSrc:  Oral    SpO2:  96% 99% 99%  Weight:  116.8 kg    Height:  5\' 4"  (1.626 m)      GENERAL: The patient is a well-nourished female, in no acute distress. The vital signs are documented above. CARDIOVASCULAR: 2+ radial pulses bilaterally.  Very small surface veins PULMONARY: There is good air exchange  ABDOMEN: Soft and non-tender.  Morbidly obese MUSCULOSKELETAL: There are no major deformities or cyanosis. NEUROLOGIC: No focal weakness or paresthesias are detected. SKIN: There are no ulcers or rashes noted. PSYCHIATRIC: The patient has a normal affect.  DATA:  Vein map pending  MEDICAL ISSUES: I discussed long-term access for hemodialysis.  She is left-handed so we will therefore restrict her right arm.  Will plan access prior to discharge home.  From a physical exam standpoint it appears she has very small surface veins and in all likelihood will require prosthetic graft.  We will make final recommendation depending map   Rosetta Posner, MD [facility] Tel [phone] Pager [phone]

## 2018-06-01 NOTE — Progress Notes (Signed)
[facility] NEPHROLOGY PROGRESS NOTE  Assessment/ Plan: Pt is a 62 y.o. yo female with hypertension, diabetes, CKD 3 admitted with pneumonia, sepsis and possible aortic fall marantic lesion, AKI requiring CVVH in the beginning now on intermittent hemo-.  #AKI on CKD 3 in the setting of sepsis, concomitant ARB and possible cardiorenal syndrome.  Patient received CRRT from 2/22 and transition to intermittent hemo-on 2/24.  Patient remains oliguric and dialysis dependent.  -Status post TDC by IR on 3/3 -Vein mapping ordered, VVS consulted for AVF placement -Dialysis today, tolerating well. -CLIP  #Aortic valve vegetation concern for culture-negative versus marantic AV lesion: TEE was done.  #Hyperkalemia: Improved  #Acute on chronic diastolic CHF: Now managing volume with dialysis.  #Anemia of CKD: Iron saturation 14%.  Continue IV iron and Aranesp.  Monitor CBC.    #Hypertension:   Monitor blood pressure.  Continue current cardiac medication.  #Hyperphosphatemia: PTH level 143, continue Auryxia.  Monitor phosphorus level.  Subjective: Seen and examined at dialysis unit.  Tolerating well.  She got tunneled catheter.  Denied headache, dizziness, nausea vomiting.  Objective Vital signs in last 24 hours: Vitals:   06/01/18 1030 06/01/18 1100 06/01/18 1115 06/01/18 1200  BP: (!) 150/71 (!) 141/69 (!) 158/77 (!) 160/77  Pulse: 71 78 77 81  Resp:   18 19  Temp:   97.6 F (36.4 C)   TempSrc:   Oral   SpO2:   96% 99%  Weight:   116.8 kg   Height:       Weight change: -4.423 kg  Intake/Output Summary (Last 24 hours) at 06/01/2018 1250 Last data filed at 06/01/2018 1115 Gross per 24 hour  Intake 893.02 ml  Output 3100 ml  Net -2206.98 ml       Labs: Basic Metabolic Panel: Recent Labs  Lab 05/30/18 0657 05/31/18 0609 06/01/18 0335  NA 135 133* 134*  K 4.4 3.6 3.7  CL 98 96* 96*  CO2 26 27 24   GLUCOSE 137* 124* 137*  BUN 73* 38* 55*  CREATININE 5.79*  3.70* 4.44*  CALCIUM 8.1* 8.2*  7.8* 8.2*  PHOS 10.4* 6.3* 6.8*   Liver Function Tests: Recent Labs  Lab 05/30/18 0657 05/31/18 0609 06/01/18 0335  ALBUMIN 2.3* 2.4* 2.4*   No results for input(s): LIPASE, AMYLASE in the last 168 hours. No results for input(s): AMMONIA in the last 168 hours. CBC: Recent Labs  Lab 05/27/18 0711 05/30/18 0657 05/31/18 0609 06/01/18 0335  WBC 7.2 7.2 6.2 7.6  HGB 7.5* 7.6* 7.8* 8.3*  HCT 24.6* 26.2* 26.1* 27.6*  MCV 92.5 94.6 93.9 91.4  PLT 194 235 242 281   Cardiac Enzymes: No results for input(s): CKTOTAL, CKMB, CKMBINDEX, TROPONINI in the last 168 hours. CBG: Recent Labs  Lab 05/31/18 1221 05/31/18 1710 05/31/18 2051 06/01/18 0641 06/01/18 1146  GLUCAP 122* 109* 174* 128* 108*    Iron Studies:  Recent Labs    05/31/18 0609  IRON 35  TIBC 258  FERRITIN 266   Studies/Results: Ir Fluoro Guide Cv Line Right  Result Date: 05/31/2018 INDICATION: ACUTE RENAL FAILURE, NO CURRENT PERMANENT ACCESS EXAM: ULTRASOUND GUIDANCE FOR VASCULAR ACCESS RIGHT INTERNAL JUGULAR PERMANENT HEMODIALYSIS CATHETER Date:  05/31/2018 05/31/2018 2:24 pm Radiologist:  Jerilynn Mages. Daryll Brod, MD Guidance:  ULTRASOUND AND FLUOROSCOPIC FLUOROSCOPY TIME:  Fluoroscopy Time: 0 minutes 18 seconds (5 mGy). MEDICATIONS: ANCEF 2 G ADMINISTERED WITHIN 1 HOUR OF THE PROCEDURE ANESTHESIA/SEDATION: Versed 1.0 mg IV; Fentanyl 25 mcg IV; Moderate Sedation Time:  21 minutes The patient was continuously monitored during the procedure by the interventional radiology nurse under my direct supervision. CONTRAST:  None. COMPLICATIONS: None immediate. PROCEDURE: Informed consent was obtained from the patient following explanation of the procedure, risks, benefits and alternatives. The patient understands, agrees and consents for the procedure. All questions were addressed. A time out was performed. Maximal barrier sterile technique utilized including caps, mask, sterile gowns, sterile gloves,  large sterile drape, hand hygiene, and 2% chlorhexidine scrub. Under sterile conditions and local anesthesia, right internal jugular micropuncture venous access was performed with ultrasound. Images were obtained for documentation of the patent right internal jugular vein. A guide wire was inserted followed by a transitional dilator. Next, a 0.035 guidewire was advanced into the IVC with a 5-French catheter. Measurements were obtained from the right venotomy site to the proximal right atrium. In the right infraclavicular chest, a subcutaneous tunnel was created under sterile conditions and local anesthesia. 1% lidocaine with epinephrine was utilized for this. The 19 cm tip to cuff dialysis catheter was tunneled subcutaneously to the venotomy site and inserted into the SVC/RA junction through a valved peel-away sheath. Position was confirmed with fluoroscopy. Images were obtained for documentation. Blood was aspirated from the catheter followed by saline and heparin flushes. The appropriate volume and strength of heparin was instilled in each lumen. Caps were applied. The catheter was secured at the tunnel site with Gelfoam and a pursestring suture. The venotomy site was closed with subcuticular Vicryl suture. Dermabond was applied to the small right neck incision. A dry sterile dressing was applied. The catheter is ready for use. No immediate complications. IMPRESSION: Ultrasound and fluoroscopically guided right internal jugular tunneled hemodialysis catheter (19 cm tip to cuff dialysis catheter). Electronically Signed   By: Jerilynn Mages.  Shick M.D.   On: 05/31/2018 14:32   Ir US Guide Vasc Access Right  Result Date: 05/31/2018 INDICATION: ACUTE RENAL FAILURE, NO CURRENT PERMANENT ACCESS EXAM: ULTRASOUND GUIDANCE FOR VASCULAR ACCESS RIGHT INTERNAL JUGULAR PERMANENT HEMODIALYSIS CATHETER Date:  05/31/2018 05/31/2018 2:24 pm Radiologist:  M. Daryll Brod, MD Guidance:  ULTRASOUND AND FLUOROSCOPIC FLUOROSCOPY TIME:  Fluoroscopy  Time: 0 minutes 18 seconds (5 mGy). MEDICATIONS: ANCEF 2 G ADMINISTERED WITHIN 1 HOUR OF THE PROCEDURE ANESTHESIA/SEDATION: Versed 1.0 mg IV; Fentanyl 25 mcg IV; Moderate Sedation Time:  21 minutes The patient was continuously monitored during the procedure by the interventional radiology nurse under my direct supervision. CONTRAST:  None. COMPLICATIONS: None immediate. PROCEDURE: Informed consent was obtained from the patient following explanation of the procedure, risks, benefits and alternatives. The patient understands, agrees and consents for the procedure. All questions were addressed. A time out was performed. Maximal barrier sterile technique utilized including caps, mask, sterile gowns, sterile gloves, large sterile drape, hand hygiene, and 2% chlorhexidine scrub. Under sterile conditions and local anesthesia, right internal jugular micropuncture venous access was performed with ultrasound. Images were obtained for documentation of the patent right internal jugular vein. A guide wire was inserted followed by a transitional dilator. Next, a 0.035 guidewire was advanced into the IVC with a 5-French catheter. Measurements were obtained from the right venotomy site to the proximal right atrium. In the right infraclavicular chest, a subcutaneous tunnel was created under sterile conditions and local anesthesia. 1% lidocaine with epinephrine was utilized for this. The 19 cm tip to cuff dialysis catheter was tunneled subcutaneously to the venotomy site and inserted into the SVC/RA junction through a valved peel-away sheath. Position was confirmed with fluoroscopy. Images were obtained  for documentation. Blood was aspirated from the catheter followed by saline and heparin flushes. The appropriate volume and strength of heparin was instilled in each lumen. Caps were applied. The catheter was secured at the tunnel site with Gelfoam and a pursestring suture. The venotomy site was closed with subcuticular Vicryl suture.  Dermabond was applied to the small right neck incision. A dry sterile dressing was applied. The catheter is ready for use. No immediate complications. IMPRESSION: Ultrasound and fluoroscopically guided right internal jugular tunneled hemodialysis catheter (19 cm tip to cuff dialysis catheter). Electronically Signed   By: Jerilynn Mages.  Shick M.D.   On: 05/31/2018 14:32    Medications: Infusions: . sodium chloride 10 mL/hr at 05/30/18 0830  . feeding supplement (VITAL AF 1.2 CAL) Stopped (05/27/18 1900)  . ferumoxytol      Scheduled Medications: . arformoterol  15 mcg Nebulization BID  . budesonide (PULMICORT) nebulizer solution  0.5 mg Nebulization BID  . Chlorhexidine Gluconate Cloth  6 each Topical Q0600  . darbepoetin (ARANESP) injection - DIALYSIS  60 mcg Intravenous Q Wed-HD  . diltiazem  240 mg Oral Daily  . feeding supplement (NEPRO CARB STEADY)  237 mL Oral BID BM  . feeding supplement (PRO-STAT SUGAR FREE 64)  30 mL Per Tube BID  . ferric citrate  420 mg Oral TID WC  . heparin  5,000 Units Subcutaneous Q8H  . insulin aspart  0-5 Units Subcutaneous QHS  . insulin aspart  0-9 Units Subcutaneous TID WC  . ipratropium-albuterol  3 mL Nebulization TID  . isosorbide dinitrate  10 mg Oral TID  . metoprolol succinate  100 mg Oral Daily  . multivitamin  1 tablet Oral QHS    have reviewed scheduled and prn medications.  Physical Exam: General: Not in distress, comfortable Heart:RRR, s1s2 nl, no rubs Lungs: Bibasilar decreased breath sound, no wheezing Abdomen:soft, Non-tender, non-distended Extremities: Lower extremity pitting edema+ Dialysis Access: Tunneled catheter site looks clean  Braulio Kiedrowski Prasad Llewelyn Sheaffer 06/01/2018,12:50 PM  LOS: 11 days

## 2018-06-01 NOTE — Progress Notes (Addendum)
ANTICOAGULATION CONSULT NOTE - Initial Consult  Pharmacy Consult for heparin Indication: atrial fibrillation  No Known Allergies  Patient Measurements: Height: 5\' 4"  (162.6 cm) Weight: 257 lb 8 oz (116.8 kg) IBW/kg (Calculated) : 54.7 Heparin Dosing Weight: 82.9 kg  Vital Signs: Temp: 97.6 F (36.4 C) (03/04 1115) Temp Source: Oral (03/04 1115) BP: 160/77 (03/04 1200) Pulse Rate: 81 (03/04 1200)  Labs: Recent Labs    05/30/18 1015 05/31/18 0609 06/01/18 0335 06/01/18 1226  HGB  --  7.8* 8.3* 9.3*  HCT  --  26.1* 27.6* 29.9*  PLT  --  242 281 303  LABPROT 20.0* 14.3  --   --   INR 1.7* 1.1  --   --   CREATININE  --  3.70* 4.44* 2.00*    Estimated Creatinine Clearance: 36.6 mL/min (A) (by C-G formula based on SCr of 2 mg/dL (H)).   Medical History: Past Medical History:  Diagnosis Date  . Arthritis   . Asthma   . Chronic diastolic CHF (congestive heart failure) ([place])   . CKD (chronic kidney disease), stage III ([place])   . Diabetes mellitus type 2 in obese ([place])   . Hypertension   . Iron deficiency anemia 10/16/2010  . Mental handicap 10/16/2010  . Mild CAD 2013  . Morbid obesity ([place])   . OSA (obstructive sleep apnea)     Medications:  Scheduled:  . arformoterol  15 mcg Nebulization BID  . budesonide (PULMICORT) nebulizer solution  0.5 mg Nebulization BID  . Chlorhexidine Gluconate Cloth  6 each Topical Q0600  . darbepoetin (ARANESP) injection - DIALYSIS  60 mcg Intravenous Q Wed-HD  . diltiazem  240 mg Oral Daily  . feeding supplement (NEPRO CARB STEADY)  237 mL Oral BID BM  . feeding supplement (PRO-STAT SUGAR FREE 64)  30 mL Per Tube BID  . ferric citrate  420 mg Oral TID WC  . heparin  5,000 Units Subcutaneous Q8H  . insulin aspart  0-5 Units Subcutaneous QHS  . insulin aspart  0-9 Units Subcutaneous TID WC  . ipratropium-albuterol  3 mL Nebulization TID  . isosorbide dinitrate  10 mg Oral TID  . metoprolol succinate  100 mg Oral Daily  .  multivitamin  1 tablet Oral QHS    Assessment: 15 yof presenting for evaluation of VT and PAF, found to have a CHADSVASC score of 5. Was on CRRT on 2/22 now transition to intermittent HD on 2/24.  Received TD by IR on 3/3 and likely will need to AVF placement. Plan to start heparin infusion with eventually change to apixaban in future.   Hgb 9.3, plt 303. Scr 2 (CrCl 36 mL/min)- last HD session of 3/4. No s/sx of bleeding.   Goal of Therapy:  Heparin level 0.3-0.7 units/ml Monitor platelets by anticoagulation protocol: Yes   Plan:  Give 4000 units bolus x 1 Start heparin infusion at 1150 units/hr Check anti-Xa level in 8 hours and daily while on heparin Continue to monitor H&H and platelets  Antonietta Jewel, PharmD, [place] Clinical Pharmacist  Pager: [phone] Phone: [phone] 06/01/2018,2:41 PM

## 2018-06-01 NOTE — Progress Notes (Signed)
Physical Therapy Treatment Patient Details Name: [name] MRN: [id number] DOB: 08-19-1955 Today's Date: 06/01/2018    History of Present Illness Pt is a 63 y.o. female with recent admission 2/17-2/20/20 for CAP with d/c back to SNF, now readmitted 05/21/18 with increased respiratory distress in the setting of tachycardia, AKI, hyperkalemia; ETT 2/22-2/28. S/p TTE showing new AV vegetation. S/p RIJ HD cath placement 3/3. PMH includes HTN, DM, cognitive delay.    PT Comments    Pt slowly progressing with mobility. Today's session focused on LE strengthening and pre-gait activity; pt able to perform standing activity with RW and intermittent minA. Max education on importance of mobility progression; pt declining ambulation secondary to fatigue. Continue to recommend SNF-level therapies.   Follow Up Recommendations  SNF;Supervision for mobility/OOB     Equipment Recommendations  Rolling walker with 5" wheels;3in1 (PT)    Recommendations for Other Services       Precautions / Restrictions Precautions Precautions: Fall Restrictions Weight Bearing Restrictions: No    Mobility  Bed Mobility               General bed mobility comments: Received sitting in recliner  Transfers Overall transfer level: Needs assistance Equipment used: Rolling walker (2 wheeled) Transfers: Sit to/from Stand Sit to Stand: Min assist         General transfer comment: Pt able to stand on third attempt, reliant on momentum to power into standing with minA for trunk elevation to RW. Cues for correct hand placement; poor eccentric control into sitting  Ambulation/Gait Ambulation/Gait assistance: Min assist;Min guard   Assistive device: Rolling walker (2 wheeled)       General Gait Details: Pt declined ambulation, agreeable to standing pre-gait activity. Able to shift weight R/L and alternate heel raises with min guard, heavy reliance on BUE support. Required max encouragement to completely  lift each foot off ground, pt eventually able to march in place a few times with min guard   Stairs             Wheelchair Mobility    Modified Rankin (Stroke Patients Only)       Balance Overall balance assessment: Needs assistance   Sitting balance-Leahy Scale: Fair       Standing balance-Leahy Scale: Poor Standing balance comment: Reliant on UE support                            Cognition Arousal/Alertness: Awake/alert Behavior During Therapy: WFL for tasks assessed/performed Overall Cognitive Status: History of cognitive impairments - at baseline                                 General Comments: Poor insight into deficits. Answering questions appropriately, although some difficulty following commands. Apparent "cognitive delay" at baseline      Exercises General Exercises - Lower Extremity Long Arc Quad: AROM;Both;Seated Hip Flexion/Marching: AROM;Both;Seated    General Comments        Pertinent Vitals/Pain Pain Assessment: No/denies pain    Home Living Family/patient expects to be discharged to:: Skilled nursing facility                    Prior Function            PT Goals (current goals can now be found in the care plan section) Acute Rehab PT Goals Patient Stated Goal: "I'm tired and  want to rest" PT Goal Formulation: With patient Time For Goal Achievement: 06/12/18 Potential to Achieve Goals: Good Progress towards PT goals: Progressing toward goals    Frequency    Min 2X/week      PT Plan Current plan remains appropriate    Co-evaluation              AM-PAC PT "6 Clicks" Mobility   Outcome Measure  Help needed turning from your back to your side while in a flat bed without using bedrails?: A Little Help needed moving from lying on your back to sitting on the side of a flat bed without using bedrails?: A Lot Help needed moving to and from a bed to a chair (including a wheelchair)?: A  Little Help needed standing up from a chair using your arms (e.g., wheelchair or bedside chair)?: A Little Help needed to walk in hospital room?: A Little Help needed climbing 3-5 steps with a railing? : A Lot 6 Click Score: 16    End of Session Equipment Utilized During Treatment: Gait belt;Oxygen Activity Tolerance: Patient limited by fatigue Patient left: in chair;with call bell/phone within reach Nurse Communication: Mobility status PT Visit Diagnosis: Unsteadiness on feet (R26.81);Other abnormalities of gait and mobility (R26.89);Muscle weakness (generalized) (M62.81)     Time: 3875-6433 PT Time Calculation (min) (ACUTE ONLY): 12 min  Charges:  $Therapeutic Exercise: 8-22 mins                    Mabeline Caras, PT, DPT Acute Rehabilitation Services  Pager [phone] Office [place] 06/01/2018, 3:51 PM

## 2018-06-01 NOTE — Consult Note (Addendum)
Cardiology Consultation:   Patient ID: [name]; [id number]; [birth year]   Admit date: 05/21/2018 Date of Consult: 06/01/2018  Primary Care Provider: Rosita Fire, MD Primary Cardiologist: Jenkins Rouge, MD Primary Electrophysiologist:  None  Chief Complaint: shortness of breath  Patient Profile:   [name] is a 63 y.o. female with a hx of cognitive delay previously in ALF, HTN, DM, CKD III, reported chronic diastolic CHF on Lasix PTA, ?Cushing's disease, right adrenal adenoma, asthma, arthritis, iron deficiency anemia, morbid obesity, OSA who is being seen today for the evaluation of VT and PAF at the request of Dr. Ree Kida.  She was admitted with severe hyperkalemia, acute persistent renal failure requiring initiation of dialysis, possible LA vegetation, and lung mass ultimately determined to be carcinoid.   History of Present Illness:   Notes indicate that in 2013 she was admitted with acute systolic CHF with EF 19%. She underwent cath showing reported 30% narrowing of the ramus and some very mild LAD disease. She was admitted in 2014 for CHF but also in the setting of acute renal failure, so was treated with Bidil and avoidance of nephrotoxic agents. She was on digoxin for a period of time. At last cardiology OV in 2018 she was due to start a new medicine for Cushing's that could interfere with digoxin. EF had normalized and therefore Dr. Johnsie Cancel stopped it due to lack of clear indication.  She was recently admitted 2/17-2/20/20 with fever, chills, vomiting and CAP (multiple residents with ILI). During that admission she was found to have a LLL mass that would require further management. She returned to the ER 05/21/18 with severe respiratory distress requiring BiPAP. She was in a wide complex tachycardia at 170bpm that either represented ventricular tachycardia or an alternative tachyarrhythmia with widening of the QRS due to severe hyperkalemia >7.5. She was given amiodarone  300mg  bolus. Per notes, as she was being prepared to be shocked, she converted to reported junctional rhythm with a rate of 70. EKG showed sine wave morphology without obvious P waves. IV calcium was administered. She was intubated but vomited during the procedure. She was started on antibiotics, bicarb, IV calcium, nebulizer, D50 and insulin bolus. After stabilization of potassium her rhythm returned to narrow complex. Strep pneumo was positive. She was intubated for several days. In the interim, her renal failure has not improved - she has been oliguric requiring initiation of dialysis as inpatient. 2D echo 05/22/18 showed EF >65%, impaired diastolic relaxation, mild calcification of AV, small mobile density of the ventricular surface of aortic valve which was challenging to visualized. Our team was called to perform TEE which showed this was a calcified, heterogeneous, primarily immobile lesion on the left coronary cusp with appearance of subacute or healed vegetation vs calcified nodule. There was no LAA thrombus. LA size was normal. Infectious disease was consulted and in the setting of normal blood cultures, they did not feel this represented infective endocarditis. She also had bronchoscopy to evaluate her LLL mass with pathology revealing typical carcinoid, pending oncology evaluation.  During admission she was also noted to have episodes of PAF, initially diagnosed post-bronchoscopy, then again 3/1 requiring diltiazem drip (converted to oral), and again overnight last night requiring uptitration of diltiazem to 240mg . Unfortunately after she went to dialysis and back, no recent telemetry is available except from 11am on. She is maintaining NSR. She is also on Toprol 100mg  daily. She was unaware of any palpitations. She denies any CP or SOB. She is  A+Ox3 except for the year. She is able to relay family history of afib in mother and CHF in father. She remains hypertensive and on [place] O2. Seen by palliative  care to establish [place] with outcome to continue current treatment plan/full code.   Past Medical History:  Diagnosis Date  . Arthritis   . Asthma   . CHF (congestive heart failure) ([place])   . Chronic diastolic CHF (congestive heart failure) ([place])   . CKD (chronic kidney disease), stage III ([place])   . Diabetes mellitus type 2 in obese ([place])   . Hypertension   . Iron deficiency anemia 10/16/2010  . Mental handicap 10/16/2010  . Morbid obesity ([place])   . OSA (obstructive sleep apnea)     Past Surgical History:  Procedure Laterality Date  . ABDOMINAL HYSTERECTOMY    . CESAREAN SECTION    . CHOLECYSTECTOMY    . COLONOSCOPY  08/2010   normal TI, sigmoid polyp (adenoma ). Next TCS due  08/2015,  . ESOPHAGOGASTRODUODENOSCOPY  08/2010   antral and duodenal erosions s/p bx (chronic gastritis, no h.pylori, no celiac dz ), hiatal hernia  . IR FLUORO GUIDE CV LINE RIGHT  05/31/2018  . IR US GUIDE VASC ACCESS RIGHT  05/31/2018  . KNEE SURGERY     right knee @ 63 years of age  . LEFT AND RIGHT HEART CATHETERIZATION WITH CORONARY ANGIOGRAM N/A 09/21/2011   Procedure: LEFT AND RIGHT HEART CATHETERIZATION WITH CORONARY ANGIOGRAM;  Surgeon: Birdie Riddle, MD;  Location: [facility]ATH [facility];  Service: Cardiovascular;  Laterality: N/A;     Inpatient Medications: Scheduled Meds: . arformoterol  15 mcg Nebulization BID  . budesonide (PULMICORT) nebulizer solution  0.5 mg Nebulization BID  . Chlorhexidine Gluconate Cloth  6 each Topical Q0600  . darbepoetin (ARANESP) injection - DIALYSIS  60 mcg Intravenous Q Wed-HD  . diltiazem  240 mg Oral Daily  . feeding supplement (NEPRO CARB STEADY)  237 mL Oral BID BM  . feeding supplement (PRO-STAT SUGAR FREE 64)  30 mL Per Tube BID  . ferric citrate  420 mg Oral TID WC  . heparin  5,000 Units Subcutaneous Q8H  . insulin aspart  0-5 Units Subcutaneous QHS  . insulin aspart  0-9 Units Subcutaneous TID WC  . ipratropium-albuterol  3 mL Nebulization TID  . isosorbide  dinitrate  10 mg Oral TID  . metoprolol succinate  100 mg Oral Daily  . multivitamin  1 tablet Oral QHS   Continuous Infusions: . sodium chloride 10 mL/hr at 05/30/18 0830  . feeding supplement (VITAL AF 1.2 CAL) Stopped (05/27/18 1900)  . [START ON 06/03/2018] ferric gluconate (FERRLECIT/NULECIT) IV    . ferumoxytol     PRN Meds: sodium chloride, diphenhydrAMINE, heparin, heparin, hydrALAZINE  Home Meds: Prior to Admission medications   Medication Sig Start Date End Date Taking? Authorizing Provider  acetaminophen (TYLENOL) 500 MG tablet Take 500 mg by mouth 2 (two) times daily as needed for mild pain.   Yes [provider]  albuterol (PROVENTIL HFA;VENTOLIN HFA) 108 (90 Base) MCG/ACT inhaler Inhale 2 puffs into the lungs every 6 (six) hours as needed for shortness of breath.   Yes [provider]  amoxicillin-clavulanate (AUGMENTIN) 875-125 MG tablet Take 1 tablet by mouth 2 (two) times daily. Started on 05-20-18 DS 15 05/20/18  Yes [provider]  aspirin EC 81 MG tablet Take 81 mg by mouth daily.   Yes [provider]  Choline Fenofibrate (FENOFIBRIC ACID) 135 MG  CPDR Take 1 capsule by mouth daily.   Yes [provider]  Diphenhyd-Hydrocort-Nystatin (FIRST-DUKES MOUTHWASH) SUSP Use as directed 5 mLs in the mouth or throat 4 (four) times daily. Swish and spit   Yes [provider]  fluticasone (VERAMYST) 27.5 MCG/SPRAY nasal spray Place 2 sprays into the nose daily.   Yes [provider]  Fluticasone-Umeclidin-Vilant (TRELEGY ELLIPTA) 100-62.5-25 MCG/INH AEPB Inhale into the lungs daily.   Yes [provider]  furosemide (LASIX) 40 MG tablet Take 40 mg by mouth daily.   Yes [provider]  guaiFENesin (ROBITUSSIN) 100 MG/5ML SOLN Take 5 mLs (100 mg total) by mouth every 4 (four) hours as needed for cough or to loosen phlegm. 05/16/18  Yes Shah, Pratik D, DO  hydrALAZINE (APRESOLINE) 25 MG tablet Take 25 mg by  mouth 3 (three) times daily.   Yes [provider]  insulin regular human CONCENTRATED (HUMULIN R U-500 KWIKPEN) 500 UNIT/ML kwikpen Inject 20 Units into the skin 3 (three) times daily with meals. Inject 40 units with breakfast, 40 units with lunch, and 40 units with supper when blood glucose before food is greater than 90 mg/dL Patient taking differently: Inject 40 Units into the skin 3 (three) times daily before meals. Inject 40 units w food if BS greater than 90 mg/dL 05/16/18  Yes Shah, Pratik D, DO  ipratropium-albuterol (DUONEB) 0.5-2.5 (3) MG/3ML SOLN Take 3 mLs by nebulization every 6 (six) hours as needed (shortness of breath).   Yes [provider]  isosorbide dinitrate (ISORDIL) 20 MG tablet Take 20 mg by mouth 3 (three) times daily.   Yes [provider]  loratadine (CLARITIN) 10 MG tablet Take 10 mg by mouth daily.   Yes [provider]  losartan (COZAAR) 100 MG tablet Take 100 mg by mouth daily.  05/09/18  Yes [provider]  methylPREDNISolone (MEDROL DOSEPAK) 4 MG TBPK tablet Take 4 mg by mouth as directed. On package 05/20/18  Yes [provider]  potassium chloride SA (K-DUR,KLOR-CON) 20 MEQ tablet Take 20 mEq by mouth daily.  11/10/17  Yes [provider]  triamcinolone ointment (KENALOG) 0.5 % Apply 1 application topically 2 (two) times daily as needed (rash).   Yes [provider]  ACCU-CHEK AVIVA PLUS test strip USE TO TEST THREE TIMES DAILY. 02/23/18   Cassandria Anger, MD  ACCU-CHEK SOFTCLIX LANCETS lancets USE TO TEST THREE TIMES DAILY. 02/23/18   Cassandria Anger, MD  NOVOFINE AUTOCOVER 30G X 8 MM MISC USE AS DIRECTED FOR 3 TIMES DAILY INSULIN ADMINISTRATION. 04/04/18   Nida, Marella Chimes, MD  SURE COMFORT PEN NEEDLES 31G X 8 MM MISC USE AS DIRECTED WITH LEVEMIR AND NOVOLOG. UP TO FOUR TIMES DAILY. 02/12/17   Cassandria Anger, MD    Allergies:   No Known Allergies  Social History:     Social History   Socioeconomic History  . Marital status: Divorced    Spouse name: Not on file  . Number of children: 1  . Years of education: Not on file  . Highest education level: Not on file  Occupational History    Employer: UNEMPLOYED  Social Needs  . Financial resource strain: Not on file  . Food insecurity:    Worry: Not on file    Inability: Not on file  . Transportation needs:    Medical: Not on file    Non-medical: Not on file  Tobacco Use  . Smoking status: Former Smoker  Packs/day: 1.00    Years: 0.00    Pack years: 0.00    Types: Cigarettes    Last attempt to quit: 04/11/1996    Years since quitting: 22.1  . Smokeless tobacco: Never Used  . Tobacco comment: quit a couple year ago  Substance and Sexual Activity  . Alcohol use: No  . Drug use: No  . Sexual activity: Never  Lifestyle  . Physical activity:    Days per week: Not on file    Minutes per session: Not on file  . Stress: Not on file  Relationships  . Social connections:    Talks on phone: Not on file    Gets together: Not on file    Attends religious service: Not on file    Active member of club or organization: Not on file    Attends meetings of clubs or organizations: Not on file    Relationship status: Not on file  . Intimate partner violence:    Fear of current or ex partner: Not on file    Emotionally abused: Not on file    Physically abused: Not on file    Forced sexual activity: Not on file  Other Topics Concern  . Not on file  Social History Narrative   Lives with parents.     Family History:   The patient's family history includes Atrial fibrillation in her father; Diabetes in her mother; Heart failure in her mother; Hypertension in her father, mother, and son; Kidney disease in an other family member. There is no history of Colon cancer.  ROS:  Please see the history of present illness.  All other ROS reviewed and negative.     Physical Exam/Data:   Vitals:   06/01/18  1030 06/01/18 1100 06/01/18 1115 06/01/18 1200  BP: (!) 150/71 (!) 141/69 (!) 158/77 (!) 160/77  Pulse: 71 78 77 81  Resp:   18 19  Temp:   97.6 F (36.4 C)   TempSrc:   Oral   SpO2:   96% 99%  Weight:   116.8 kg   Height:        Intake/Output Summary (Last 24 hours) at 06/01/2018 1431 Last data filed at 06/01/2018 1200 Gross per 24 hour  Intake 1033.02 ml  Output 3100 ml  Net -2066.98 ml   Last 3 Weights 06/01/2018 06/01/2018 06/01/2018  Weight (lbs) 257 lb 8 oz 263 lb 6.1 oz 263 lb 6.4 oz  Weight (kg) 116.8 kg 119.47 kg 119.477 kg    Body mass index is 44.2 kg/m.  General: Well developed, well nourished obese WF, in no acute distress. Sitting in recliner. Head: Normocephalic, atraumatic, sclera non-icteric, no xanthomas, nares are without discharge.  Neck: Negative for carotid bruits. JVD not elevated. Lungs: Decreased BS at bases. No wheezes, rales, or rhonchi. Breathing is unlabored. Heart: RRR with S1 S2. No murmurs, rubs, or gallops appreciated. Abdomen: Soft, non-tender, non-distended with normoactive bowel sounds. No hepatomegaly. No rebound/guarding. No obvious abdominal masses. Msk:  Strength and tone appear normal for age. Extremities: No clubbing or cyanosis. 1+ BLE edema superimposed on baseline obese body habitus.  Distal pedal pulses are 2+ and equal bilaterally. Neuro: Alert and oriented X 3. No facial asymmetry. No focal deficit. Moves all extremities spontaneously. Psych:  Responds to questions appropriately with a normal affect.  EKG:  Multiple tracings reviewed, most current from overnight shows afib 131bpm nonspecific STT changes  Laboratory Data:  Chemistry Recent Labs  Lab 05/31/18 [phone] 06/01/18 0335 06/01/18  1226  NA 133* 134* 136  K 3.6 3.7 3.6  CL 96* 96* 98  CO2 27 24 27   GLUCOSE 124* 137* 117*  BUN 38* 55* 16  CREATININE 3.70* 4.44* 2.00*  CALCIUM 8.2*  7.8* 8.2* 8.0*  GFRNONAA 12* 10* 26*  GFRAA 14* 12* 30*  ANIONGAP 10 14 11     Recent  Labs  Lab 05/31/18 0609 06/01/18 0335 06/01/18 1226  ALBUMIN 2.4* 2.4* 2.6*   Hematology Recent Labs  Lab 05/31/18 0609 06/01/18 0335 06/01/18 1226  WBC 6.2 7.6 6.8  RBC 2.78* 3.02* 3.29*  HGB 7.8* 8.3* 9.3*  HCT 26.1* 27.6* 29.9*  MCV 93.9 91.4 90.9  MCH 28.1 27.5 28.3  MCHC 29.9* 30.1 31.1  RDW 13.8 13.7 13.7  PLT 242 281 303   Cardiac EnzymesNo results for input(s): TROPONINI in the last 168 hours. No results for input(s): TROPIPOC in the last 168 hours.  BNPNo results for input(s): BNP, PROBNP in the last 168 hours.  DDimer No results for input(s): DDIMER in the last 168 hours.  Radiology/Studies:  Ir Fluoro Guide Cv Line Right  Result Date: 05/31/2018 INDICATION: ACUTE RENAL FAILURE, NO CURRENT PERMANENT ACCESS EXAM: ULTRASOUND GUIDANCE FOR VASCULAR ACCESS RIGHT INTERNAL JUGULAR PERMANENT HEMODIALYSIS CATHETER Date:  05/31/2018 05/31/2018 2:24 pm Radiologist:  M. Daryll Brod, MD Guidance:  ULTRASOUND AND FLUOROSCOPIC FLUOROSCOPY TIME:  Fluoroscopy Time: 0 minutes 18 seconds (5 mGy). MEDICATIONS: ANCEF 2 G ADMINISTERED WITHIN 1 HOUR OF THE PROCEDURE ANESTHESIA/SEDATION: Versed 1.0 mg IV; Fentanyl 25 mcg IV; Moderate Sedation Time:  21 minutes The patient was continuously monitored during the procedure by the interventional radiology nurse under my direct supervision. CONTRAST:  None. COMPLICATIONS: None immediate. PROCEDURE: Informed consent was obtained from the patient following explanation of the procedure, risks, benefits and alternatives. The patient understands, agrees and consents for the procedure. All questions were addressed. A time out was performed. Maximal barrier sterile technique utilized including caps, mask, sterile gowns, sterile gloves, large sterile drape, hand hygiene, and 2% chlorhexidine scrub. Under sterile conditions and local anesthesia, right internal jugular micropuncture venous access was performed with ultrasound. Images were obtained for documentation of  the patent right internal jugular vein. A guide wire was inserted followed by a transitional dilator. Next, a 0.035 guidewire was advanced into the IVC with a 5-French catheter. Measurements were obtained from the right venotomy site to the proximal right atrium. In the right infraclavicular chest, a subcutaneous tunnel was created under sterile conditions and local anesthesia. 1% lidocaine with epinephrine was utilized for this. The 19 cm tip to cuff dialysis catheter was tunneled subcutaneously to the venotomy site and inserted into the SVC/RA junction through a valved peel-away sheath. Position was confirmed with fluoroscopy. Images were obtained for documentation. Blood was aspirated from the catheter followed by saline and heparin flushes. The appropriate volume and strength of heparin was instilled in each lumen. Caps were applied. The catheter was secured at the tunnel site with Gelfoam and a pursestring suture. The venotomy site was closed with subcuticular Vicryl suture. Dermabond was applied to the small right neck incision. A dry sterile dressing was applied. The catheter is ready for use. No immediate complications. IMPRESSION: Ultrasound and fluoroscopically guided right internal jugular tunneled hemodialysis catheter (19 cm tip to cuff dialysis catheter). Electronically Signed   By: Jerilynn Mages.  Shick M.D.   On: 05/31/2018 14:32   Ir US Guide Vasc Access Right  Result Date: 05/31/2018 INDICATION: ACUTE RENAL FAILURE, NO CURRENT PERMANENT ACCESS EXAM:  ULTRASOUND GUIDANCE FOR VASCULAR ACCESS RIGHT INTERNAL JUGULAR PERMANENT HEMODIALYSIS CATHETER Date:  05/31/2018 05/31/2018 2:24 pm Radiologist:  M. Daryll Brod, MD Guidance:  ULTRASOUND AND FLUOROSCOPIC FLUOROSCOPY TIME:  Fluoroscopy Time: 0 minutes 18 seconds (5 mGy). MEDICATIONS: ANCEF 2 G ADMINISTERED WITHIN 1 HOUR OF THE PROCEDURE ANESTHESIA/SEDATION: Versed 1.0 mg IV; Fentanyl 25 mcg IV; Moderate Sedation Time:  21 minutes The patient was continuously  monitored during the procedure by the interventional radiology nurse under my direct supervision. CONTRAST:  None. COMPLICATIONS: None immediate. PROCEDURE: Informed consent was obtained from the patient following explanation of the procedure, risks, benefits and alternatives. The patient understands, agrees and consents for the procedure. All questions were addressed. A time out was performed. Maximal barrier sterile technique utilized including caps, mask, sterile gowns, sterile gloves, large sterile drape, hand hygiene, and 2% chlorhexidine scrub. Under sterile conditions and local anesthesia, right internal jugular micropuncture venous access was performed with ultrasound. Images were obtained for documentation of the patent right internal jugular vein. A guide wire was inserted followed by a transitional dilator. Next, a 0.035 guidewire was advanced into the IVC with a 5-French catheter. Measurements were obtained from the right venotomy site to the proximal right atrium. In the right infraclavicular chest, a subcutaneous tunnel was created under sterile conditions and local anesthesia. 1% lidocaine with epinephrine was utilized for this. The 19 cm tip to cuff dialysis catheter was tunneled subcutaneously to the venotomy site and inserted into the SVC/RA junction through a valved peel-away sheath. Position was confirmed with fluoroscopy. Images were obtained for documentation. Blood was aspirated from the catheter followed by saline and heparin flushes. The appropriate volume and strength of heparin was instilled in each lumen. Caps were applied. The catheter was secured at the tunnel site with Gelfoam and a pursestring suture. The venotomy site was closed with subcuticular Vicryl suture. Dermabond was applied to the small right neck incision. A dry sterile dressing was applied. The catheter is ready for use. No immediate complications. IMPRESSION: Ultrasound and fluoroscopically guided right internal jugular  tunneled hemodialysis catheter (19 cm tip to cuff dialysis catheter). Electronically Signed   By: Jerilynn Mages.  Shick M.D.   On: 05/31/2018 14:32    Assessment and Plan:   1. Acute respiratory failure with hypoxia/hypercarbia and post-obstructive PNA - multifactorial respiratory failure, extubated to [place] O2. Wean O2 as able.  2. Wide complex tachycardia on admission 05/21/18 (with subsequent junctional rhythm) - difficult to know whether this represented VT or other arrhythmia with wide complex due to hyperkalemia. Regardless, has not had recurrence since correction of lytes. LVEF is normal. Continue beta blocker. Initial troponin was normal.  3. Paroxysmal atrial fib (newly recognized) - likely precipitated by numerous medical stressors which are likely to continue to be a challenge for a while - renal failure, OSA, carcinoid. CHADSVASC is 5 for h/o LV dysfunction, HTN, DM, vascular disease (plaque on TEE), female which would warrant long term anticoagulation. Hgb has stabilized, no evidence of active bleeding. She is s/p [facility] by IR 3/3, will likely need AVF placement and also consideration of surgery by oncology - therefore will start heparin per pharmacy with ability to pause when needed, but likely plan to transition to apixaban per pharmacy when no further procedures acutely needed. She seems to be doing well on higher dose of diltiazem. There is room to increase from BP standpoint if needed, but follow on present regimen. Fortunately she is asymptomatic during episodes. Will also add baseline thyroid function to labs.  3. Carcinoid of lung - per IM notes they reviewed with oncology who will review but they suspect a slow growing tumor with good prognosis.  4. Aortic valve abnormality - as above, not felt to represent infective endocarditis. Interestingly there is an association between carcinoid and valvular lesions. Follow clinically for now.  5. Acute on chronic diastolic CHF - most recent fluid issues  likely related to renal failure/oliguria, now managed with HD. She does have a hx of LV dysfunction but this has not been seen in many years. She was on nitrates/hydralazine as OP. Consider resumption of hydralazine for BP - will defer to primary team.  6. Mild CAD in 2013 - update lipid profile in AM along with f/u LFTs. Consider initiation of statin based on results since pt is diabetic as well.  For questions or updates, please contact [place] Please consult [email or web address] for contact info under Cardiology/STEMI.   Signed, Charlie Pitter, PA-C  06/01/2018 2:31 PM   ATTENDING ATTESTATION  I have seen, examined and evaluated the patient this PM along with Melina Copa, PA-C.  After reviewing all the available data and chart, we discussed the patients laboratory, study & physical findings as well as symptoms in detail. I agree with her findings, examination as well as impression recommendations as per our discussion.    Very interesting situation woman with a newly diagnosed carcinoid tumor and marantic aortic valve lesion who has now shown signs of A. fib.  On admission she had broad-wide-complex tachycardia that was eventually proven to be related to acute renal failure and hyperkalemia.  This is subsequently resolved.  She has now had episodes of A. fib noted initially during a TEE done to evaluate an aortic valve lesion.  She is now back in sinus rhythm on combination of diltiazem and beta-blocker with adequately controlled rate.  She does have an elevated CHA2DS2VASC Score of 4 (female, HTN, vascular disease, DM)  I do agree with anticoagulation which can be initiated once any procedures are completed.  For now would recommend IV heparin, especially if she goes back into A. fib.  Would potentially use Lovenox although this is less favorable in dialysis patients.  Once procedures are complete, we then potentially consider starting low-dose Eliquis.    Glenetta Hew, M.D.,  M.S. Interventional Cardiologist   Pager # [phone] Phone # [phone] [address] [place], [place] [address]

## 2018-06-01 NOTE — NC FL2 (Signed)
[place] LEVEL OF CARE SCREENING TOOL     IDENTIFICATION  Patient Name: [name] Birthdate: [birth year] Sex: female Admission Date (Current Location): 05/21/2018  [facility] and [place] Number:  Herbalist and Address:  [facility] [place]. [facility], [place] [address], [address] [place], [place] [address]      Provider Number: [id number]  Attending Physician Name and Address:  Cristal Ford, DO  Relative Name and Phone Number:  [name], guardian, [phone]    Current Level of Care: Hospital Recommended Level of Care: [place] Prior Approval Number:    Date Approved/Denied:   PASRR Number: pending  Discharge Plan: SNF    Current Diagnoses: Patient Active Problem List   Diagnosis Date Noted  . Aortic valve mass   . Respiratory arrest ([place]) 05/21/2018  . Acute renal failure (ARF) ([place]) 05/21/2018  . CAP (community acquired pneumonia) 05/13/2018  . Mixed hyperlipidemia 11/23/2017  . Hypercortisolemia ([place]) 04/27/2016  . Morbid obesity due to excess calories ([place]) 07/02/2015  . Uncontrolled type 2 diabetes mellitus with stage 4 chronic kidney disease ([place]) 02/26/2015  . Acute kidney injury superimposed on chronic kidney disease ([place]) 10/07/2011  . Cutaneous candidiasis 10/21/2010  . Essential hypertension, benign 10/21/2010  . Iron deficiency anemia 10/16/2010  . Mental handicap 10/16/2010    Orientation RESPIRATION BLADDER Height & Weight     Self, Time, Situation, Place  O2(nasal cannula 3L) Incontinent Weight: 119.5 kg Height:  5\' 4"  (162.6 cm)  BEHAVIORAL SYMPTOMS/MOOD NEUROLOGICAL BOWEL NUTRITION STATUS      Incontinent Diet(please see DC summary)  AMBULATORY STATUS COMMUNICATION OF NEEDS Skin   Limited Assist Verbally Normal                       Personal Care Assistance Level of Assistance  Bathing, Feeding, Dressing Bathing Assistance: Limited assistance Feeding assistance: Independent Dressing  Assistance: Limited assistance     Functional Limitations Info  Sight, Hearing, Speech Sight Info: Adequate Hearing Info: Adequate Speech Info: Adequate    SPECIAL CARE FACTORS FREQUENCY  PT (By licensed PT), OT (By licensed OT)     PT Frequency: 5x/week OT Frequency: 5x/week            Contractures Contractures Info: Not present    Additional Factors Info  Code Status, Allergies, Insulin Sliding Scale Code Status Info: Full Allergies Info: No known allergies   Insulin Sliding Scale Info: novolog 3x/day with meals and at bedtime       Current Medications (06/01/2018):  This is the current hospital active medication list Current Facility-Administered Medications  Medication Dose Route Frequency Provider Last Rate Last Dose  . 0.9 %  sodium chloride infusion   Intravenous PRN Milagros Loll, MD 10 mL/hr at 05/30/18 0830    . 0.9 %  sodium chloride infusion  100 mL Intravenous PRN Rosita Fire, MD      . 0.9 %  sodium chloride infusion  100 mL Intravenous PRN Rosita Fire, MD      . alteplase (CATHFLO ACTIVASE) injection 2 mg  2 mg Intracatheter Once PRN Rosita Fire, MD      . arformoterol [facility]) nebulizer solution 15 mcg  15 mcg Nebulization BID Omar Person, NP   15 mcg at 05/31/18 2214  . budesonide (PULMICORT) nebulizer solution 0.5 mg  0.5 mg Nebulization BID Hayden Pedro M, NP   0.5 mg at 05/31/18 2214  . Chlorhexidine Gluconate Cloth 2 %  PADS 6 each  6 each Topical Q0600 Rosita Fire, MD   6 each at 06/01/18 0645  . Darbepoetin Alfa (ARANESP) injection 60 mcg  60 mcg Intravenous Q Wed-HD Rosita Fire, MD   60 mcg at 06/01/18 0944  . diltiazem (CARDIZEM CD) 24 hr capsule 240 mg  240 mg Oral Daily Kirby-Graham, Karsten Fells, NP      . diphenhydrAMINE (BENADRYL) injection 12.5 mg  12.5 mg Intravenous Q6H PRN Anders Simmonds, MD   12.5 mg at 05/24/18 0230  . feeding supplement (NEPRO CARB STEADY) liquid 237 mL  237  mL Oral BID BM Danford, Suann Larry, MD   237 mL at 05/31/18 1629  . feeding supplement (PRO-STAT SUGAR FREE 64) liquid 30 mL  30 mL Per Tube BID Icard, Bradley L, DO   30 mL at 05/31/18 0957  . feeding supplement (VITAL AF 1.2 CAL) liquid 1,000 mL  1,000 mL Per Tube Continuous Icard, Bradley L, DO   Stopped at 05/27/18 1900  . ferric citrate (AURYXIA) tablet 420 mg  420 mg Oral TID WC Rosita Fire, MD   420 mg at 05/31/18 1630  . ferumoxytol (FERAHEME) 510 mg in sodium chloride 0.9 % 100 mL IVPB  510 mg Intravenous Weekly Rosita Fire, MD      . heparin injection 1,000 Units  1,000 Units Dialysis PRN Rosita Fire, MD   1,000 Units at 06/01/18 8127  . heparin injection 1,000 Units  1,000 Units Dialysis PRN Rosita Fire, MD      . heparin injection 5,000 Units  5,000 Units Subcutaneous Q8H Monia Sabal, PA-C      . hydrALAZINE (APRESOLINE) injection 10 mg  10 mg Intravenous Q4H PRN Deterding, Guadelupe Sabin, MD   10 mg at 05/31/18 2126  . insulin aspart (novoLOG) injection 0-5 Units  0-5 Units Subcutaneous QHS Danford, Christopher P, MD      . insulin aspart (novoLOG) injection 0-9 Units  0-9 Units Subcutaneous TID WC Edwin Dada, MD   2 Units at 05/31/18 1237  . ipratropium-albuterol (DUONEB) 0.5-2.5 (3) MG/3ML nebulizer solution 3 mL  3 mL Nebulization TID Omar Person, NP   3 mL at 05/31/18 2214  . isosorbide dinitrate (ISORDIL) tablet 10 mg  10 mg Oral TID Edwin Dada, MD   10 mg at 05/31/18 2127  . lidocaine (PF) (XYLOCAINE) 1 % injection 5 mL  5 mL Intradermal PRN Rosita Fire, MD      . lidocaine-prilocaine (EMLA) cream 1 application  1 application Topical PRN Rosita Fire, MD      . metoprolol succinate (TOPROL-XL) 24 hr tablet 100 mg  100 mg Oral Daily Edwin Dada, MD   100 mg at 05/31/18 0958  . multivitamin (RENA-VIT) tablet 1 tablet  1 tablet Oral QHS Edwin Dada, MD   1 tablet at  05/31/18 2224  . pentafluoroprop-tetrafluoroeth (GEBAUERS) aerosol 1 application  1 application Topical PRN Rosita Fire, MD         Discharge Medications: Please see discharge summary for a list of discharge medications.  Relevant Imaging Results:  Relevant Lab Results:   Additional Information SSN: [id number]  Estanislado Emms, LCSW

## 2018-06-01 NOTE — Progress Notes (Signed)
Pt noted to have converted back to AFIB RVR w/HR 120's to 150's. Pt asymptomatic, BP 137/85, O2 sat 97% on 2L/[place]. EKG being obtained and provider on call notified. Will continue to monitor. Jessie Foot, RN

## 2018-06-01 NOTE — Social Work (Signed)
Uploaded clinicals to PASRR for review. Patient's PASRR pending due to history of IDD. Also awaiting continued PT recommendations. Patient from ALF, and may be able to return pending progress. If not, legal guardian is agreeable to SNF.  Estanislado Emms, LCSW [phone]

## 2018-06-01 NOTE — Progress Notes (Signed)
PROGRESS NOTE    [name]  YJE:[id number] DOB: November 18, 1955 DOA: 05/21/2018 PCP: Rosita Fire, MD   Brief Narrative:  HPI on 05/21/2018 by [name] 63 year old female presents from [place] to [facility] with reported one day of respiratory distress, placed on BiPAP by EMS. On arrival to ED found to be in [place]. Given Amiodarone bolus Cardiology consulted. Preparing for cardioversion, however patient converted into wide complex tachycardia. Intubated, during intubation had notable emesis. Given Vancomycin/Zosyn. K 7.5, Crt 4.31. Administered Bicarb, Calcium, Albuterol, Insulin/Dextrose. ABG 7.15/45. LA 3.2. Transferred to [facility] for further treatment.   Recent admission 2/14-2/17 with CAP. Noted to have AKI which improved. At discharge Cr 1.63. CT Chest concerning for neoplastic process of left lower lung base.   Interim history Patient was found to have severe dyspnea requiring BiPAP by EMS.  On arrival in the ER, she was found to be in V. tach.  Patient was given an amiodarone bolus and then developed unstable wide-complex tachycardia and acute respiratory failure.  Patient was admitted by [facility] and was in the ICU and intubated.  TRH is assumed care. 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 -Oncology consulted and appreciated  Acute renal failure on chronic kidney disease, stage III/hyperkalemia/metabolic acidosis/lactic acidosis -Creatinine baseline 1.3-1.6,  now she is ill dialysis dependent -Nephrology consulted and appreciated, pending further recommendations- transitioning to intermittent hemo -Neurosurgery consulted for [facility]  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 -1 cm mobile AV vegetation -Blood cultures unremarkable x4 -Infectious disease consulted and appreciated, doubt culture-negative endocarditis but rather that this is either marantic or simply a calcified nodule  Acute on chronic diastolic CHF/hypertension -Patient appears to be volume overloaded, currently dialyzing for volume control -She failed Lasix therapy -BP stable -Continue aspirin, Isordil, metoprolol, diltiazem  Ventricular tachycardia/junctional rhythm in the setting of hyperkalemia -Resolved, continue metoprolol  Atrial fibrillation with RVR, paroxysmal -Cardiology consulted and appreciated -Patient seems to have also developed self-limited A. fib post bronchoscopy however overnight patient had repeat episode of atrial fibrillation with heart rate in the 130s -Patient was given diltiazem  Sleep apnea -Continue BiPAP  Diabetes mellitus, type II -Continue insulin sliding scale with CBG monitoring  History of asthma -Continue budesonide, aformoterol, DuoNebs  DVT Prophylaxis  Heparin  Code Status: Full  Family Communication: None at bedside  Disposition Plan: Admitted. Dispo TBD  Consultants PCCM Cardiology Nephrology Interventional radiology Vascular surgery  Procedures  Intubation/Extubation Bronchoscopy  TEE  Antibiotics   Anti-infectives (From admission, onward)   Start     Dose/Rate Route Frequency Ordered Stop   05/31/18 0730  ceFAZolin (ANCEF) IVPB 2g/100 mL premix     2 g 200 mL/hr over 30 Minutes Intravenous To Radiology 05/31/18 0557 05/31/18 1412   05/30/18 0830  ceFAZolin (ANCEF) IVPB 2g/100 mL premix     2 g 200 mL/hr over 30 Minutes Intravenous To Radiology 05/30/18 0824 05/30/18 1614   05/28/18 1000  amoxicillin-clavulanate  (AUGMENTIN) 500-125 MG per tablet 500 mg     1 tablet Oral Daily 05/28/18 0949 05/29/18 0935   05/27/18 1400  Ampicillin-Sulbactam (UNASYN) 3 g  in sodium chloride 0.9 % 100 mL IVPB  Status:  Discontinued     3 g 200 mL/hr over 30 Minutes Intravenous Every 12 hours 05/27/18 1039 05/28/18 0949   05/27/18 1200  vancomycin (VANCOCIN) IVPB 1000 mg/200 mL premix  Status:  Discontinued     1,000 mg 200 mL/hr over 60 Minutes Intravenous Every M-W-F (Hemodialysis) 05/25/18 1111 05/26/18 1600   05/25/18 1800  vancomycin (VANCOCIN) IVPB 1000 mg/200 mL premix  Status:  Discontinued     1,000 mg 200 mL/hr over 60 Minutes Intravenous Every M-W-F (Hemodialysis) 05/25/18 1106 05/25/18 1111   05/25/18 1500  vancomycin (VANCOCIN) 1,500 mg in sodium chloride 0.9 % 500 mL IVPB     1,500 mg 250 mL/hr over 120 Minutes Intravenous  Once 05/25/18 1111 05/25/18 1843   05/25/18 1400  Ampicillin-Sulbactam (UNASYN) 3 g in sodium chloride 0.9 % 100 mL IVPB  Status:  Discontinued     3 g 200 mL/hr over 30 Minutes Intravenous Every 12 hours 05/25/18 1111 05/27/18 1039   05/25/18 1200  Ampicillin-Sulbactam (UNASYN) 3 g in sodium chloride 0.9 % 100 mL IVPB  Status:  Discontinued     3 g 200 mL/hr over 30 Minutes Intravenous Every 12 hours 05/25/18 1108 05/25/18 1111   05/25/18 1130  vancomycin (VANCOCIN) IVPB 1000 mg/200 mL premix  Status:  Discontinued     1,000 mg 200 mL/hr over 60 Minutes Intravenous  Once 05/25/18 1106 05/25/18 1111   05/24/18 0600  ceFEPIme (MAXIPIME) 1 g in sodium chloride 0.9 % 100 mL IVPB  Status:  Discontinued     1 g 200 mL/hr over 30 Minutes Intravenous Every 24 hours 05/23/18 0557 05/23/18 1156   05/24/18 0600  ampicillin-sulbactam (UNASYN) 1.5 g in sodium chloride 0.9 % 100 mL IVPB  Status:  Discontinued     1.5 g 200 mL/hr over 30 Minutes Intravenous Every 24 hours 05/23/18 1159 05/23/18 1204   05/23/18 2200  ampicillin-sulbactam (UNASYN) 1.5 g in sodium chloride 0.9 % 100 mL IVPB  Status:   Discontinued     1.5 g 200 mL/hr over 30 Minutes Intravenous Every 24 hours 05/23/18 1204 05/25/18 1108   05/23/18 2000  ampicillin-sulbactam (UNASYN) 1.5 g in sodium chloride 0.9 % 100 mL IVPB  Status:  Discontinued     1.5 g 200 mL/hr over 30 Minutes Intravenous Every 24 hours 05/23/18 1156 05/23/18 1159   05/23/18 0600  ceFEPIme (MAXIPIME) 2 g in sodium chloride 0.9 % 100 mL IVPB     2 g 200 mL/hr over 30 Minutes Intravenous  Once 05/23/18 0548 05/23/18 0702   05/23/18 0600  vancomycin (VANCOCIN) 2,000 mg in sodium chloride 0.9 % 500 mL IVPB     2,000 mg 250 mL/hr over 120 Minutes Intravenous  Once 05/23/18 0548 05/23/18 0933   05/23/18 0559  vancomycin variable dose per unstable renal function (pharmacist dosing)  Status:  Discontinued      Does not apply See admin instructions 05/23/18 0559 05/23/18 1156   05/22/18 2200  vancomycin (VANCOCIN) IVPB 1000 mg/200 mL premix  Status:  Discontinued     1,000 mg 200 mL/hr over 60 Minutes Intravenous Every 24 hours 05/21/18 2047 05/21/18 2206   05/21/18 1730  vancomycin (VANCOCIN) IVPB 1000 mg/200 mL premix  Status:  Discontinued     1,000 mg 200 mL/hr over 60 Minutes Intravenous  Once 05/21/18 1621 05/21/18 2206   05/21/18 1630  vancomycin (VANCOCIN) IVPB 1000 mg/200 mL premix     1,000  mg 200 mL/hr over 60 Minutes Intravenous  Once 05/21/18 1621 05/21/18 2309   05/21/18 1545  ceFEPIme (MAXIPIME) 2 g in sodium chloride 0.9 % 100 mL IVPB     2 g 200 mL/hr over 30 Minutes Intravenous  Once 05/21/18 1543 05/21/18 1745      Subjective:   [name] seen and examined today.  Seen in dialysis.  Patient has no complaints this morning.  Only feeling thirsty and wanting to drink something.  Denies current chest pain, shortness of breath, dental pain, nausea or vomiting, diarrhea constipation, dizziness or headache.  Objective:   Vitals:   06/01/18 1115 06/01/18 1200 06/01/18 1438 06/01/18 1500  BP: (!) 158/77 (!) 160/77  (!) 143/72    Pulse: 77 81  86  Resp: _0 Temp: 97.6 F (36.4 C)   (!) 97.3 F (36.3 C)  TempSrc: Oral   Oral  SpO2: 96% 99% 99% 99%  Weight: 116.8 kg     Height: _1  (1.626 m)       Intake/Output Summary (Last 24 hours) at 06/01/2018 1620 Last data filed at 06/01/2018 1529 Gross per 24 hour  Intake 1273.02 ml  Output 3100 ml  Net -1826.98 ml   Filed Weights   06/01/18 0504 06/01/18 0700 06/01/18 1115  Weight: 119.5 kg 119.5 kg 116.8 kg    Exam  General: Well developed, well nourished, NAD, appears stated age  HEENT: NCAT, mucous membranes moist.   Neck: Supple  Cardiovascular: S1 S2 auscultated, no murmur  Respiratory: Diminished but clear breath sounds  Abdomen: Soft, nontender, nondistended, + bowel sounds  Extremities: warm dry without cyanosis clubbing. LE edema  Neuro: AAOx3, nonfocal  Psych: pleasant, appropriate mood and affect   Data Reviewed: I have personally reviewed following labs and imaging studies  CBC: Recent Labs  Lab 05/27/18 0711 05/30/18 0657 05/31/18 0609 06/01/18 0335 06/01/18 1226  WBC 7.2 7.2 6.2 7.6 6.8  HGB 7.5* 7.6* 7.8* 8.3* 9.3*  HCT 24.6* 26.2* 26.1* 27.6* 29.9*  MCV 92.5 94.6 93.9 91.4 90.9  PLT 194 235 242 281 086   Basic Metabolic Panel: Recent Labs  Lab 05/29/18 0234 05/30/18 0657 05/31/18 0609 06/01/18 0335 06/01/18 1226  NA 138 135 133* 134* 136  K 4.2 4.4 3.6 3.7 3.6  CL 100 98 96* 96* 98  CO2 _2 GLUCOSE 119* 137* 124* 137* 117*  BUN 48* 73* 38* 55* 16  CREATININE 4.65* 5.79* 3.70* 4.44* 2.00*  CALCIUM 8.4* 8.1* 8.2*  7.8* 8.2* 8.0*  PHOS 7.9* 10.4* 6.3* 6.8* 2.6   GFR: Estimated Creatinine Clearance: 36.6 mL/min (A) (by C-G formula based on SCr of 2 mg/dL (H)). Liver Function Tests: Recent Labs  Lab 05/29/18 0234 05/30/18 0657 05/31/18 0609 06/01/18 0335 06/01/18 1226  ALBUMIN 2.4* 2.3* 2.4* 2.4* 2.6*   No results for input(s): LIPASE, AMYLASE in the last 168 hours. No results for  input(s): AMMONIA in the last 168 hours. Coagulation Profile: Recent Labs  Lab 05/30/18 1015 05/31/18 0609  INR 1.7* 1.1   Cardiac Enzymes: No results for input(s): CKTOTAL, CKMB, CKMBINDEX, TROPONINI in the last 168 hours. BNP (last 3 results) No results for input(s): PROBNP in the last 8760 hours. HbA1C: No results for input(s): HGBA1C in the last 72 hours. CBG: Recent Labs  Lab 05/31/18 1221 05/31/18 1710 05/31/18 2051 06/01/18 0641 06/01/18 1146  GLUCAP 122* 109* 174* 128* 108*   Lipid Profile: No results for input(s): CHOL,  HDL, LDLCALC, TRIG, CHOLHDL, LDLDIRECT in the last 72 hours. Thyroid Function Tests: No results for input(s): TSH, T4TOTAL, FREET4, T3FREE, THYROIDAB in the last 72 hours. Anemia Panel: Recent Labs    05/31/18 0609  FERRITIN 266  TIBC 258  IRON 35   Urine analysis:    Component Value Date/Time   COLORURINE AMBER (A) 05/22/2018 0758   APPEARANCEUR CLOUDY (A) 05/22/2018 0758   LABSPEC 1.025 05/22/2018 0758   PHURINE 5.0 05/22/2018 0758   GLUCOSEU 50 (A) 05/22/2018 0758   HGBUR LARGE (A) 05/22/2018 0758   BILIRUBINUR NEGATIVE 05/22/2018 0758   KETONESUR 5 (A) 05/22/2018 0758   PROTEINUR 100 (A) 05/22/2018 0758   UROBILINOGEN 0.2 02/01/2013 1145   NITRITE NEGATIVE 05/22/2018 0758   LEUKOCYTESUR SMALL (A) 05/22/2018 0758   Sepsis Labs: _0 (procalcitonin:4,lacticidven:4)  ) Recent Results (from the past 240 hour(s))  Culture, blood (routine x 2)     Status: None   Collection Time: 05/24/18  1:04 PM  Result Value Ref Range Status   Specimen Description BLOOD RIGHT ANTECUBITAL  Final   Special Requests AEROBIC BOTTLE ONLY Blood Culture adequate volume  Final   Culture   Final    NO GROWTH 5 DAYS Performed at [facility], [place] [address] [place], [place] [address]    Report Status 05/29/2018 FINAL  Final  Culture, blood (routine x 2)     Status: None   Collection Time: 05/24/18  1:08 PM  Result Value Ref Range Status     Specimen Description BLOOD BLOOD RIGHT HAND  Final   Special Requests AEROBIC BOTTLE ONLY Blood Culture adequate volume  Final   Culture   Final    NO GROWTH 5 DAYS Performed at [facility], [place] [address], [place], [place] [address]    Report Status 05/29/2018 FINAL  Final  Culture, bal-quantitative     Status: Abnormal   Collection Time: 05/26/18 12:17 PM  Result Value Ref Range Status   Specimen Description BRONCHIAL ALVEOLAR LAVAGE  Final   Special Requests LLL  Final   Gram Stain   Final    ABUNDANT WBC PRESENT, PREDOMINANTLY PMN NO ORGANISMS SEEN    Culture (A)  Final    30,000 COLONIES/mL Consistent with normal respiratory flora. Performed at [facility], [place] [address], [place], [place] [address]    Report Status 05/28/2018 FINAL  Final      Radiology Studies: Ir Fluoro Guide Cv Line Right  Result Date: 05/31/2018 INDICATION: ACUTE RENAL FAILURE, NO CURRENT PERMANENT ACCESS EXAM: ULTRASOUND GUIDANCE FOR VASCULAR ACCESS RIGHT INTERNAL JUGULAR PERMANENT HEMODIALYSIS CATHETER Date:  05/31/2018 05/31/2018 2:24 pm Radiologist:  Jerilynn Mages. Daryll Brod, MD Guidance:  ULTRASOUND AND FLUOROSCOPIC FLUOROSCOPY TIME:  Fluoroscopy Time: 0 minutes 18 seconds (5 mGy). MEDICATIONS: ANCEF 2 G ADMINISTERED WITHIN 1 HOUR OF THE PROCEDURE ANESTHESIA/SEDATION: Versed 1.0 mg IV; Fentanyl 25 mcg IV; Moderate Sedation Time:  21 minutes The patient was continuously monitored during the procedure by the interventional radiology nurse under my direct supervision. CONTRAST:  None. COMPLICATIONS: None immediate. PROCEDURE: Informed consent was obtained from the patient following explanation of the procedure, risks, benefits and alternatives. The patient understands, agrees and consents for the procedure. All questions were addressed. A time out was performed. Maximal barrier sterile technique utilized including caps, mask, sterile gowns, sterile gloves, large sterile drape, hand hygiene, and 2%  chlorhexidine scrub. Under sterile conditions and local anesthesia, right internal jugular micropuncture venous access was performed with ultrasound. Images  were obtained for documentation of the patent right internal jugular vein. A guide wire was inserted followed by a transitional dilator. Next, a 0.035 guidewire was advanced into the IVC with a 5-French catheter. Measurements were obtained from the right venotomy site to the proximal right atrium. In the right infraclavicular chest, a subcutaneous tunnel was created under sterile conditions and local anesthesia. 1% lidocaine with epinephrine was utilized for this. The 19 cm tip to cuff dialysis catheter was tunneled subcutaneously to the venotomy site and inserted into the SVC/RA junction through a valved peel-away sheath. Position was confirmed with fluoroscopy. Images were obtained for documentation. Blood was aspirated from the catheter followed by saline and heparin flushes. The appropriate volume and strength of heparin was instilled in each lumen. Caps were applied. The catheter was secured at the tunnel site with Gelfoam and a pursestring suture. The venotomy site was closed with subcuticular Vicryl suture. Dermabond was applied to the small right neck incision. A dry sterile dressing was applied. The catheter is ready for use. No immediate complications. IMPRESSION: Ultrasound and fluoroscopically guided right internal jugular tunneled hemodialysis catheter (19 cm tip to cuff dialysis catheter). Electronically Signed   By: Jerilynn Mages.  Shick M.D.   On: 05/31/2018 14:32   Ir US Guide Vasc Access Right  Result Date: 05/31/2018 INDICATION: ACUTE RENAL FAILURE, NO CURRENT PERMANENT ACCESS EXAM: ULTRASOUND GUIDANCE FOR VASCULAR ACCESS RIGHT INTERNAL JUGULAR PERMANENT HEMODIALYSIS CATHETER Date:  05/31/2018 05/31/2018 2:24 pm Radiologist:  M. Daryll Brod, MD Guidance:  ULTRASOUND AND FLUOROSCOPIC FLUOROSCOPY TIME:  Fluoroscopy Time: 0 minutes 18 seconds (5 mGy).  MEDICATIONS: ANCEF 2 G ADMINISTERED WITHIN 1 HOUR OF THE PROCEDURE ANESTHESIA/SEDATION: Versed 1.0 mg IV; Fentanyl 25 mcg IV; Moderate Sedation Time:  21 minutes The patient was continuously monitored during the procedure by the interventional radiology nurse under my direct supervision. CONTRAST:  None. COMPLICATIONS: None immediate. PROCEDURE: Informed consent was obtained from the patient following explanation of the procedure, risks, benefits and alternatives. The patient understands, agrees and consents for the procedure. All questions were addressed. A time out was performed. Maximal barrier sterile technique utilized including caps, mask, sterile gowns, sterile gloves, large sterile drape, hand hygiene, and 2% chlorhexidine scrub. Under sterile conditions and local anesthesia, right internal jugular micropuncture venous access was performed with ultrasound. Images were obtained for documentation of the patent right internal jugular vein. A guide wire was inserted followed by a transitional dilator. Next, a 0.035 guidewire was advanced into the IVC with a 5-French catheter. Measurements were obtained from the right venotomy site to the proximal right atrium. In the right infraclavicular chest, a subcutaneous tunnel was created under sterile conditions and local anesthesia. 1% lidocaine with epinephrine was utilized for this. The 19 cm tip to cuff dialysis catheter was tunneled subcutaneously to the venotomy site and inserted into the SVC/RA junction through a valved peel-away sheath. Position was confirmed with fluoroscopy. Images were obtained for documentation. Blood was aspirated from the catheter followed by saline and heparin flushes. The appropriate volume and strength of heparin was instilled in each lumen. Caps were applied. The catheter was secured at the tunnel site with Gelfoam and a pursestring suture. The venotomy site was closed with subcuticular Vicryl suture. Dermabond was applied to the small  right neck incision. A dry sterile dressing was applied. The catheter is ready for use. No immediate complications. IMPRESSION: Ultrasound and fluoroscopically guided right internal jugular tunneled hemodialysis catheter (19 cm tip to cuff dialysis catheter). Electronically Signed  By: Eugenie Filler M.D.   On: 05/31/2018 14:32     Scheduled Meds: . arformoterol  15 mcg Nebulization BID  . budesonide (PULMICORT) nebulizer solution  0.5 mg Nebulization BID  . Chlorhexidine Gluconate Cloth  6 each Topical Q0600  . darbepoetin (ARANESP) injection - DIALYSIS  60 mcg Intravenous Q Wed-HD  . diltiazem  240 mg Oral Daily  . feeding supplement (NEPRO CARB STEADY)  237 mL Oral BID BM  . feeding supplement (PRO-STAT SUGAR FREE 64)  30 mL Per Tube BID  . ferric citrate  420 mg Oral TID WC  . insulin aspart  0-5 Units Subcutaneous QHS  . insulin aspart  0-9 Units Subcutaneous TID WC  . ipratropium-albuterol  3 mL Nebulization TID  . isosorbide dinitrate  10 mg Oral TID  . metoprolol succinate  100 mg Oral Daily  . multivitamin  1 tablet Oral QHS   Continuous Infusions: . sodium chloride 10 mL/hr at 05/30/18 0830  . feeding supplement (VITAL AF 1.2 CAL) Stopped (05/27/18 1900)  . [START ON 06/03/2018] ferric gluconate (FERRLECIT/NULECIT) IV    . heparin 1,150 Units/hr (06/01/18 1615)     LOS: 11 days   Time Spent in minutes   45 minutes (greater than 50% of time spent with patient face to face, as well as reviewing old records, calling consults, and formulating a plan)  Raylei Losurdo D.O. on 06/01/2018 at 4:20 PM  Between 7am to 7pm - Please see pager noted on [email or web address]  After 7pm go to [email or web address]  And look for the night coverage person covering for me after hours  [facility] Office  [phone]

## 2018-06-01 NOTE — Consult Note (Addendum)
[place]  [phone] [phone]   MEDICAL ONCOLOGY - INITIAL CONSULATION  Referral MD: Dr. 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 F with mental disability in LTC, asthma well-controlled, CKD III Baseline Cr 1.3-1.6, HTN, and DM who presented with severe dyspnea requiring BiPAP by EMS.  On arrival in the ER was in V. tach, cardiology consulted and given amiodarone bolus, then developed unstable wide-complex tachycardia and acute respiratory failure.  Creatinine 4.3, potassium 7.5 on admission, pH 7.15, PCO2 45, lactic acid 3.2, chest x-ray pneumonia.  Temporizing potassium measures given, broad-spectrum antibiotics started, intubated and transferred to ICU at [facility]. Extubated 2/28 after 6 days.  The patient was recently admitted to the hospital earlier in February 2020 for community-acquired pneumonia.  During her hospitalization, the patient had a CT of the chest performed on 05/13/2018 which showed a masslike consolidation measuring 5 x 8.2 cm over the left lower lobe.  It was recommended that she have a follow-up CT scan performed within the next month as an outpatient.  During this current hospitalization, it was recommended that she undergo TEE and bronchoscopy.  This was performed on 05/26/2018.  Biopsy results showed typical carcinoid tumor and atypical squamous metaplasia, favor reactive.    When seen today, the patient had just finished dialysis.  She reports having fatigue and a cough.  She states that she has occasional blood-tinged sputum when she coughs.  She denies fevers and chills.  Denies chest pain and shortness of breath.  Denies abdominal discomfort, nausea, vomiting, constipation, diarrhea.  She reports intermittent headaches. Oncology was consulted for recommendations regarding her carcinoid tumor.  Of note, the patient reside in an assisted  living facility in [place], [place].  She tells me that she has a son who visits her regularly but he is not her power of attorney.  She has a guardian [name]) who oversees her care.  Telephone numbers are on the chart.   Past Medical History:  Diagnosis Date  . Arthritis   . Asthma   . CHF (congestive heart failure) ([place])   . Diabetes mellitus without complication ([place])   . Hypertension   . Iron deficiency anemia 10/16/2010  . Mental handicap 10/16/2010  :  Past Surgical History:  Procedure Laterality Date  . ABDOMINAL HYSTERECTOMY    . CESAREAN SECTION    . CHOLECYSTECTOMY    . COLONOSCOPY  08/2010   normal TI, sigmoid polyp (adenoma ). Next TCS due  08/2015,  . ESOPHAGOGASTRODUODENOSCOPY  08/2010   antral and duodenal erosions s/p bx (chronic gastritis, no h.pylori, no celiac dz ), hiatal hernia  . IR FLUORO GUIDE CV LINE RIGHT  05/31/2018  . IR US GUIDE VASC ACCESS RIGHT  05/31/2018  . KNEE SURGERY     right knee @ 63 years of age  . LEFT AND RIGHT HEART CATHETERIZATION WITH CORONARY ANGIOGRAM N/A 09/21/2011   Procedure: LEFT AND RIGHT HEART CATHETERIZATION WITH CORONARY ANGIOGRAM;  Surgeon: Birdie Riddle, MD;  Location: [place] CATH LAB;  Service: Cardiovascular;  Laterality: N/A;  :  Current Facility-Administered Medications  Medication Dose Route Frequency Provider Last Rate Last Dose  . 0.9 %  sodium chloride infusion   Intravenous PRN Milagros Loll, MD 10 mL/hr at 05/30/18 0830    . arformoterol (BROVANA) nebulizer solution 15 mcg  15 mcg Nebulization BID Omar Person, NP  15 mcg at 05/31/18 2214  . budesonide (PULMICORT) nebulizer solution 0.5 mg  0.5 mg Nebulization BID Hayden Pedro M, NP   0.5 mg at 05/31/18 2214  . Chlorhexidine Gluconate Cloth 2 % PADS 6 each  6 each Topical Q0600 Rosita Fire, MD   6 each at 06/01/18 0645  . Darbepoetin Alfa (ARANESP) injection 60 mcg  60 mcg Intravenous Q Wed-HD Rosita Fire, MD      .  diltiazem [facility] CD) 24 hr capsule 240 mg  240 mg Oral Daily Kirby-Graham, Karsten Fells, NP      . diphenhydrAMINE (BENADRYL) injection 12.5 mg  12.5 mg Intravenous Q6H PRN Anders Simmonds, MD   12.5 mg at 05/24/18 0230  . feeding supplement (NEPRO CARB STEADY) liquid 237 mL  237 mL Oral BID BM Danford, Suann Larry, MD   237 mL at 05/31/18 1629  . feeding supplement (PRO-STAT SUGAR FREE 64) liquid 30 mL  30 mL Per Tube BID Icard, Bradley L, DO   30 mL at 05/31/18 0957  . feeding supplement (VITAL AF 1.2 CAL) liquid 1,000 mL  1,000 mL Per Tube Continuous Icard, Bradley L, DO   Stopped at 05/27/18 1900  . ferric citrate (AURYXIA) tablet 420 mg  420 mg Oral TID WC Rosita Fire, MD   420 mg at 05/31/18 1630  . ferumoxytol (FERAHEME) 510 mg in sodium chloride 0.9 % 100 mL IVPB  510 mg Intravenous Weekly Rosita Fire, MD      . heparin injection 5,000 Units  5,000 Units Subcutaneous Q8H Monia Sabal, PA-C      . hydrALAZINE (APRESOLINE) injection 10 mg  10 mg Intravenous Q4H PRN Deterding, Guadelupe Sabin, MD   10 mg at 05/31/18 2126  . insulin aspart (novoLOG) injection 0-5 Units  0-5 Units Subcutaneous QHS Danford, Christopher P, MD      . insulin aspart (novoLOG) injection 0-9 Units  0-9 Units Subcutaneous TID WC Edwin Dada, MD   2 Units at 05/31/18 1237  . ipratropium-albuterol (DUONEB) 0.5-2.5 (3) MG/3ML nebulizer solution 3 mL  3 mL Nebulization TID Omar Person, NP   3 mL at 05/31/18 2214  . isosorbide dinitrate (ISORDIL) tablet 10 mg  10 mg Oral TID Edwin Dada, MD   10 mg at 05/31/18 2127  . metoprolol succinate (TOPROL-XL) 24 hr tablet 100 mg  100 mg Oral Daily Edwin Dada, MD   100 mg at 05/31/18 0958  . multivitamin (RENA-VIT) tablet 1 tablet  1 tablet Oral QHS Edwin Dada, MD   1 tablet at 05/31/18 2224     No Known Allergies:  Family History  Problem Relation Age of Onset  . Diabetes Mother   . Hypertension Mother    . Hypertension Father   . Kidney disease Other        son reportedly has had cyst on kidney and lung s/p surgery at [facility]  . Hypertension Son   . Colon cancer Neg Hx   :  Social History   Socioeconomic History  . Marital status: Divorced    Spouse name: Not on file  . Number of children: 1  . Years of education: Not on file  . Highest education level: Not on file  Occupational History    Employer: UNEMPLOYED  Social Needs  . Financial resource strain: Not on file  . Food insecurity:    Worry: Not on file    Inability: Not on file  . Transportation  needs:    Medical: Not on file    Non-medical: Not on file  Tobacco Use  . Smoking status: Former Smoker    Packs/day: 1.00    Years: 0.00    Pack years: 0.00    Types: Cigarettes    Last attempt to quit: 04/11/1996    Years since quitting: 22.1  . Smokeless tobacco: Never Used  . Tobacco comment: quit a couple year ago  Substance and Sexual Activity  . Alcohol use: No  . Drug use: No  . Sexual activity: Never  Lifestyle  . Physical activity:    Days per week: Not on file    Minutes per session: Not on file  . Stress: Not on file  Relationships  . Social connections:    Talks on phone: Not on file    Gets together: Not on file    Attends religious service: Not on file    Active member of club or organization: Not on file    Attends meetings of clubs or organizations: Not on file    Relationship status: Not on file  . Intimate partner violence:    Fear of current or ex partner: Not on file    Emotionally abused: Not on file    Physically abused: Not on file    Forced sexual activity: Not on file  Other Topics Concern  . Not on file  Social History Narrative   Lives with parents.    REVIEW OF SYSTEMS:   Constitutional: Reports fatigue.  Negative for appetite change, chills, fever and unexpected weight change.  HENT:   Negative for mouth sores, nosebleeds, sore throat and trouble swallowing.   Eyes: Negative  for eye problems and icterus.  Respiratory: Reports cough with blood-tinged sputum.  Negative for shortness of breath and wheezing.   Cardiovascular: Negative for chest pain and leg swelling.  Gastrointestinal: Negative for abdominal pain, constipation, diarrhea, nausea and vomiting.  Genitourinary: Negative for bladder incontinence.   Musculoskeletal: Negative for back pain, gait problem, neck pain and neck stiffness.  Skin: Negative for itching and rash.  Neurological: Negative for dizziness, extremity weakness, gait problem, headaches, light-headedness and seizures.  Hematological: Negative for adenopathy. Does not bruise/bleed easily.  Psychiatric/Behavioral: Negative for confusion, depression and sleep disturbance. The patient is not nervous/anxious.   Exam: Patient Vitals for the past 24 hrs:  BP Temp Temp src Pulse Resp SpO2 Weight  06/01/18 0504 (!) 154/89 (!) 97.4 F (36.3 C) Oral 99 (!) 22 95 % 263 lb 6.4 oz (119.5 kg)  05/31/18 2214 - - - - - 96 % -  05/31/18 2053 (!) 173/83 98.3 F (36.8 C) Oral 81 18 97 % -  05/31/18 1655 (!) 184/86 - - 74 16 98 % -  05/31/18 1625 (!) 188/85 - - 72 19 100 % -  05/31/18 1555 (!) 172/85 - - 71 18 98 % -  05/31/18 1525 (!) 181/83 - - 72 17 100 % -  05/31/18 1456 (!) 180/100 (!) 97.5 F (36.4 C) Oral 73 18 98 % -  05/31/18 1430 (!) 174/91 - - 76 20 98 % -  05/31/18 1425 (!) 175/92 - - 73 19 97 % -  05/31/18 1420 (!) 162/85 - - 73 19 95 % -  05/31/18 1415 (!) 169/89 - - 73 19 95 % -  05/31/18 1410 (!) 163/85 - - 73 19 95 % -  05/31/18 1405 (!) 175/96 - - 74 20 95 % -  05/31/18 1400 (!)  179/94 - - 75 20 96 % -  05/31/18 1355 (!) 181/94 - - 74 18 96 % -  05/31/18 1345 (!) 185/97 - - 73 19 97 % -  05/31/18 1224 - 97.6 F (36.4 C) Oral - - - -  05/31/18 0935 - - - 75 (!) 22 100 % -  05/31/18 0813 - (!) 97.3 F (36.3 C) Oral - - - -    General:  well-nourished in no acute distress.  The patient is a poor historian.  Eyes:  no scleral  icterus.  ENT:  There were no oropharyngeal lesions.  Neck was without thyromegaly.  Lymphatics:  Negative cervical, supraclavicular or axillary adenopathy.  Respiratory: Decreased breath sounds bilaterally..  Cardiovascular:  Regular rate and rhythm, S1/S2, without murmur, rub or gallop.  Trace bilateral lower extremity edema.  GI:  abdomen was soft, flat, nontender, nondistended, without organomegaly.  Muscoloskeletal:  no spinal tenderness of palpation of vertebral spine.  Skin exam was without echymosis, petichae.  Neuro exam was nonfocal. Patient was alert and oriented.  Attention was good.   Language was appropriate.  Mood was normal without depression.  Speech was not pressured.  Thought content was not tangential.     Lab Results  Component Value Date   WBC 7.6 06/01/2018   HGB 8.3 (L) 06/01/2018   HCT 27.6 (L) 06/01/2018   PLT 281 06/01/2018   GLUCOSE 137 (H) 06/01/2018   CHOL 152 04/07/2017   TRIG 244 (H) 04/07/2017   HDL 38 (L) 04/07/2017   LDLCALC 80 04/07/2017   ALT 38 05/21/2018   AST 93 (H) 05/21/2018   NA 134 (L) 06/01/2018   K 3.7 06/01/2018   CL 96 (L) 06/01/2018   CREATININE 4.44 (H) 06/01/2018   BUN 55 (H) 06/01/2018   CO2 24 06/01/2018    Dg Chest 2 View  Result Date: 05/13/2018 CLINICAL DATA:  Cough with vomiting, diarrhea for 3 weeks. EXAM: CHEST - 2 VIEW COMPARISON:  Radiographs 09/03/2017.  CT 08/10/2017. FINDINGS: The heart size is stable at the upper limits of normal for AP technique. There is chronic left lower lobe opacity which has become more mass-like on today's lateral view, previously linear. The lungs are otherwise clear. There is no pleural effusion or pneumothorax. The bones appear unchanged. IMPRESSION: Increased, mass-like density surrounding the previously demonstrated linear left lower lobe atelectasis/scarring. This could be secondary to superimposed inflammation, although repeat chest CT recommended to exclude developing neoplasm. Electronically  Signed   By: Richardean Sale M.D.   On: 05/13/2018 19:40   Ct Chest Wo Contrast  Result Date: 05/14/2018 CLINICAL DATA:  Chest wall pain. No IV contrast due to poor renal function. Evaluate masslike density left lower lobe. Worsening cough over the past week. Fever and chills with episodes of vomiting. EXAM: CT CHEST WITHOUT CONTRAST TECHNIQUE: Multidetector CT imaging of the chest was performed following the standard protocol without IV contrast. COMPARISON:  Chest CT 08/10/2017 and abdominal CT 05/21/2016 FINDINGS: Cardiovascular: Heart is normal size. Calcification over the left main coronary artery. Minimal calcified plaque over the thoracic aorta. Vascular structures are otherwise unremarkable. Mediastinum/Nodes: Few shotty mediastinal lymph nodes which are more prominent than seen previously. Suggestion of left infrahilar adenopathy although difficult to assess without intravenous contrast. Remaining mediastinal structures are unremarkable. Lungs/Pleura: Lungs are adequately inflated. There is masslike consolidation over the posterior left lower lobe measuring approximately 5 x 8.2 cm. This is present over the area of previously seen linear airspace density. There  is cut off of the left lower lobe bronchus entering into this region of the lower lobe just proximal to this masslike consolidation. There is minimal adjacent patchy airspace density in the left lower lobe. Tiny amount of left pleural fluid remainder of the left lung as well as the right lung are clear. Upper Abdomen: Right adrenal mass without significant change from the prior exam although difficult to evaluate without intravenous contrast as it abuts the adjacent IVC. Previous cholecystectomy. Minimal calcified plaque over the abdominal aorta. Musculoskeletal: Mild degenerate change of the spine. Slight interval progression of a moderate T10 compression fracture IMPRESSION: Masslike consolidation measuring 5 x 8.2 cm over the left lower lobe.  Cut off of the left lower lobe bronchus proximal to this masslike density. Subtle adjacent patchy airspace density in the left lower lobe as well as tiny amount of left pleural fluid. Suggestion of left hilar adenopathy, although difficult to evaluate without intravenous contrast. Findings are worrisome for a neoplastic process and less likely infection or inflammatory process. Given patient's infectious clinical presentation, a short interval follow-up CT in 4 weeks may be helpful. If findings unchanged than recommend thoracic surgery consultation. Indeterminate left adrenal mass as seen on the previous exam exams as difficult to assess change as it is incompletely imaged on this exam. Minimal atherosclerotic coronary artery disease. Aortic Atherosclerosis (ICD10-I70.0). Slight interval progression of patient's known moderate T10 compression fracture. Electronically Signed   By: Marin Olp M.D.   On: 05/14/2018 00:58   US Renal  Result Date: 05/21/2018 CLINICAL DATA:  Acute kidney injury. EXAM: RENAL / URINARY TRACT ULTRASOUND COMPLETE COMPARISON:  11/17/2017 FINDINGS: Right Kidney: Renal measurements: 10.4 x 5.4 x 4.9 cm = volume: 142 mL . Echogenicity within normal limits. No mass or hydronephrosis visualized. Left Kidney: Renal measurements: 11.5 x 6.0 x 5.1 cm = volume: 181 mL. Echogenicity within normal limits. No mass or hydronephrosis visualized. Bladder: Foley catheter in place, decompressed. The previously seen adrenal nodule/adenoma noted, measuring 3.6 x 3.6 x 2.3 cm. IMPRESSION: No acute renal abnormality.  No hydronephrosis. Electronically Signed   By: Rolm Baptise M.D.   On: 05/21/2018 22:20   Ir Fluoro Guide Cv Line Right  Result Date: 05/31/2018 INDICATION: ACUTE RENAL FAILURE, NO CURRENT PERMANENT ACCESS EXAM: ULTRASOUND GUIDANCE FOR VASCULAR ACCESS RIGHT INTERNAL JUGULAR PERMANENT HEMODIALYSIS CATHETER Date:  05/31/2018 05/31/2018 2:24 pm Radiologist:  M. Daryll Brod, MD Guidance:   ULTRASOUND AND FLUOROSCOPIC FLUOROSCOPY TIME:  Fluoroscopy Time: 0 minutes 18 seconds (5 mGy). MEDICATIONS: ANCEF 2 G ADMINISTERED WITHIN 1 HOUR OF THE PROCEDURE ANESTHESIA/SEDATION: Versed 1.0 mg IV; Fentanyl 25 mcg IV; Moderate Sedation Time:  21 minutes The patient was continuously monitored during the procedure by the interventional radiology nurse under my direct supervision. CONTRAST:  None. COMPLICATIONS: None immediate. PROCEDURE: Informed consent was obtained from the patient following explanation of the procedure, risks, benefits and alternatives. The patient understands, agrees and consents for the procedure. All questions were addressed. A time out was performed. Maximal barrier sterile technique utilized including caps, mask, sterile gowns, sterile gloves, large sterile drape, hand hygiene, and 2% chlorhexidine scrub. Under sterile conditions and local anesthesia, right internal jugular micropuncture venous access was performed with ultrasound. Images were obtained for documentation of the patent right internal jugular vein. A guide wire was inserted followed by a transitional dilator. Next, a 0.035 guidewire was advanced into the IVC with a 5-French catheter. Measurements were obtained from the right venotomy site to the proximal right atrium. In  the right infraclavicular chest, a subcutaneous tunnel was created under sterile conditions and local anesthesia. 1% lidocaine with epinephrine was utilized for this. The 19 cm tip to cuff dialysis catheter was tunneled subcutaneously to the venotomy site and inserted into the SVC/RA junction through a valved peel-away sheath. Position was confirmed with fluoroscopy. Images were obtained for documentation. Blood was aspirated from the catheter followed by saline and heparin flushes. The appropriate volume and strength of heparin was instilled in each lumen. Caps were applied. The catheter was secured at the tunnel site with Gelfoam and a pursestring suture.  The venotomy site was closed with subcuticular Vicryl suture. Dermabond was applied to the small right neck incision. A dry sterile dressing was applied. The catheter is ready for use. No immediate complications. IMPRESSION: Ultrasound and fluoroscopically guided right internal jugular tunneled hemodialysis catheter (19 cm tip to cuff dialysis catheter). Electronically Signed   By: Jerilynn Mages.  Shick M.D.   On: 05/31/2018 14:32   Ir US Guide Vasc Access Right  Result Date: 05/31/2018 INDICATION: ACUTE RENAL FAILURE, NO CURRENT PERMANENT ACCESS EXAM: ULTRASOUND GUIDANCE FOR VASCULAR ACCESS RIGHT INTERNAL JUGULAR PERMANENT HEMODIALYSIS CATHETER Date:  05/31/2018 05/31/2018 2:24 pm Radiologist:  M. Daryll Brod, MD Guidance:  ULTRASOUND AND FLUOROSCOPIC FLUOROSCOPY TIME:  Fluoroscopy Time: 0 minutes 18 seconds (5 mGy). MEDICATIONS: ANCEF 2 G ADMINISTERED WITHIN 1 HOUR OF THE PROCEDURE ANESTHESIA/SEDATION: Versed 1.0 mg IV; Fentanyl 25 mcg IV; Moderate Sedation Time:  21 minutes The patient was continuously monitored during the procedure by the interventional radiology nurse under my direct supervision. CONTRAST:  None. COMPLICATIONS: None immediate. PROCEDURE: Informed consent was obtained from the patient following explanation of the procedure, risks, benefits and alternatives. The patient understands, agrees and consents for the procedure. All questions were addressed. A time out was performed. Maximal barrier sterile technique utilized including caps, mask, sterile gowns, sterile gloves, large sterile drape, hand hygiene, and 2% chlorhexidine scrub. Under sterile conditions and local anesthesia, right internal jugular micropuncture venous access was performed with ultrasound. Images were obtained for documentation of the patent right internal jugular vein. A guide wire was inserted followed by a transitional dilator. Next, a 0.035 guidewire was advanced into the IVC with a 5-French catheter. Measurements were obtained from  the right venotomy site to the proximal right atrium. In the right infraclavicular chest, a subcutaneous tunnel was created under sterile conditions and local anesthesia. 1% lidocaine with epinephrine was utilized for this. The 19 cm tip to cuff dialysis catheter was tunneled subcutaneously to the venotomy site and inserted into the SVC/RA junction through a valved peel-away sheath. Position was confirmed with fluoroscopy. Images were obtained for documentation. Blood was aspirated from the catheter followed by saline and heparin flushes. The appropriate volume and strength of heparin was instilled in each lumen. Caps were applied. The catheter was secured at the tunnel site with Gelfoam and a pursestring suture. The venotomy site was closed with subcuticular Vicryl suture. Dermabond was applied to the small right neck incision. A dry sterile dressing was applied. The catheter is ready for use. No immediate complications. IMPRESSION: Ultrasound and fluoroscopically guided right internal jugular tunneled hemodialysis catheter (19 cm tip to cuff dialysis catheter). Electronically Signed   By: Jerilynn Mages.  Shick M.D.   On: 05/31/2018 14:32   Dg Chest Port 1 View  Result Date: 05/26/2018 CLINICAL DATA:  Post bronchoscopy. EXAM: PORTABLE CHEST 1 VIEW COMPARISON:  05/22/2018. FINDINGS: Right IJ line noted with tip over superior vena cava. NG tube noted  with tip below left hemidiaphragm. Cardiomegaly with bilateral pulmonary infiltrates/edema left side greater than right consistent with CHF. Bilateral pneumonia can not be excluded. Findings have progressed from prior exam. Small left pleural effusion. No pneumothorax. IMPRESSION: Lines and tubes in stable position. Congestive heart failure bilateral pulmonary edema left side greater right and left-sided pleural effusion. Findings consistent CHF. Bilateral pneumonia can not be excluded. Findings have progressed from prior exam. Electronically Signed   By: [place]   On:  05/26/2018 14:02   Dg Chest Port 1 View  Result Date: 05/22/2018 CLINICAL DATA:  Respiratory failure EXAM: PORTABLE CHEST 1 VIEW COMPARISON:  1 day prior FINDINGS: Endotracheal tube is unchanged in position. Nasogastric tube extends beyond the inferior aspect of the film. Right internal jugular line unchanged with tip at mid to low SVC. Cardiomegaly accentuated by AP portable technique. No right-sided pleural effusion. Cannot exclude small left pleural effusion. No pneumothorax. Improved inspiratory effort and aeration. No overt congestive failure. Retrocardiac left lower lobe airspace disease is felt to be slightly improved. IMPRESSION: Improved aeration, with resolved interstitial edema and decreased left base opacity. Suspect residual small left pleural effusion. Appropriate position of support apparatus. Electronically Signed   By: Abigail Miyamoto M.D.   On: 05/22/2018 14:59   Dg Chest Port 1 View  Result Date: 05/21/2018 CLINICAL DATA:  Central line placement. EXAM: PORTABLE CHEST 1 VIEW COMPARISON:  Earlier same day FINDINGS: 2119 hours. Endotracheal tube tip is 3 cm above the base of the carina. The NG tube passes into the stomach although the distal tip position is not included on the film. Right IJ central line tip overlies the proximal SVC. No evidence for pneumothorax or substantial pleural effusion. Lung volumes are low. The cardio pericardial silhouette is enlarged. Persistent retrocardiac left base collapse/consolidation. There is pulmonary vascular congestion without overt pulmonary edema. Telemetry leads overlie the chest. IMPRESSION: 1. Right IJ central line placement with tip overlying the proximal SVC level. No pneumothorax or substantial pleural effusion. 2. Cardiomegaly with vascular congestion and low lung volumes. 3. Similar appearance of the retrocardiac left base collapse/consolidation. Electronically Signed   By: Misty Stanley M.D.   On: 05/21/2018 21:45   Dg Chest Port 1  View  Result Date: 05/21/2018 CLINICAL DATA:  Ventilator dependent. EXAM: PORTABLE CHEST 1 VIEW COMPARISON:  05/21/2018 FINDINGS: Endotracheal tube is 3 cm above the carina. NG tube enters the stomach. Cardiomegaly with vascular congestion. Left lower lobe airspace opacity again noted, unchanged. No focal opacity on the right. No visible significant effusions or acute bony abnormality. IMPRESSION: Left lower lobe airspace opacity, stable. Mild cardiomegaly and vascular congestion. Electronically Signed   By: Rolm Baptise M.D.   On: 05/21/2018 20:38   Dg Chest Portable 1 View  Result Date: 05/21/2018 CLINICAL DATA:  This post intubation EXAM: PORTABLE CHEST 1 VIEW COMPARISON:  05/13/2018 FINDINGS: Endotracheal to is in the right mainstem bronchus. Recommend retracting approximately 2-3 cm. Cardiomegaly. Low lung volumes. Increasing left perihilar and lower lobe airspace opacity which may reflect atelectasis from the right mainstem intubation. Vascular congestion. No visible effusions or acute bony abnormality. IMPRESSION: Endotracheal tube just into the right mainstem bronchus. Recommend retracting 2-3 cm. Increasing left perihilar and lower lobe opacity could reflect associated atelectasis related to right mainstem intubation. Vascular congestion, low lung volumes. These results were called by telephone at the time of interpretation on 05/21/2018 at 4:08 pm to Dr. Nat Christen , who verbally acknowledged these results. Electronically Signed   By:  Rolm Baptise M.D.   On: 05/21/2018 16:08   Dg Chest 2 View  Result Date: 05/13/2018 CLINICAL DATA:  Cough with vomiting, diarrhea for 3 weeks. EXAM: CHEST - 2 VIEW COMPARISON:  Radiographs 09/03/2017.  CT 08/10/2017. FINDINGS: The heart size is stable at the upper limits of normal for AP technique. There is chronic left lower lobe opacity which has become more mass-like on today's lateral view, previously linear. The lungs are otherwise clear. There is no pleural  effusion or pneumothorax. The bones appear unchanged. IMPRESSION: Increased, mass-like density surrounding the previously demonstrated linear left lower lobe atelectasis/scarring. This could be secondary to superimposed inflammation, although repeat chest CT recommended to exclude developing neoplasm. Electronically Signed   By: Richardean Sale M.D.   On: 05/13/2018 19:40   Ct Chest Wo Contrast  Result Date: 05/14/2018 CLINICAL DATA:  Chest wall pain. No IV contrast due to poor renal function. Evaluate masslike density left lower lobe. Worsening cough over the past week. Fever and chills with episodes of vomiting. EXAM: CT CHEST WITHOUT CONTRAST TECHNIQUE: Multidetector CT imaging of the chest was performed following the standard protocol without IV contrast. COMPARISON:  Chest CT 08/10/2017 and abdominal CT 05/21/2016 FINDINGS: Cardiovascular: Heart is normal size. Calcification over the left main coronary artery. Minimal calcified plaque over the thoracic aorta. Vascular structures are otherwise unremarkable. Mediastinum/Nodes: Few shotty mediastinal lymph nodes which are more prominent than seen previously. Suggestion of left infrahilar adenopathy although difficult to assess without intravenous contrast. Remaining mediastinal structures are unremarkable. Lungs/Pleura: Lungs are adequately inflated. There is masslike consolidation over the posterior left lower lobe measuring approximately 5 x 8.2 cm. This is present over the area of previously seen linear airspace density. There is cut off of the left lower lobe bronchus entering into this region of the lower lobe just proximal to this masslike consolidation. There is minimal adjacent patchy airspace density in the left lower lobe. Tiny amount of left pleural fluid remainder of the left lung as well as the right lung are clear. Upper Abdomen: Right adrenal mass without significant change from the prior exam although difficult to evaluate without intravenous  contrast as it abuts the adjacent IVC. Previous cholecystectomy. Minimal calcified plaque over the abdominal aorta. Musculoskeletal: Mild degenerate change of the spine. Slight interval progression of a moderate T10 compression fracture IMPRESSION: Masslike consolidation measuring 5 x 8.2 cm over the left lower lobe. Cut off of the left lower lobe bronchus proximal to this masslike density. Subtle adjacent patchy airspace density in the left lower lobe as well as tiny amount of left pleural fluid. Suggestion of left hilar adenopathy, although difficult to evaluate without intravenous contrast. Findings are worrisome for a neoplastic process and less likely infection or inflammatory process. Given patient's infectious clinical presentation, a short interval follow-up CT in 4 weeks may be helpful. If findings unchanged than recommend thoracic surgery consultation. Indeterminate left adrenal mass as seen on the previous exam exams as difficult to assess change as it is incompletely imaged on this exam. Minimal atherosclerotic coronary artery disease. Aortic Atherosclerosis (ICD10-I70.0). Slight interval progression of patient's known moderate T10 compression fracture. Electronically Signed   By: Marin Olp M.D.   On: 05/14/2018 00:58   US Renal  Result Date: 05/21/2018 CLINICAL DATA:  Acute kidney injury. EXAM: RENAL / URINARY TRACT ULTRASOUND COMPLETE COMPARISON:  11/17/2017 FINDINGS: Right Kidney: Renal measurements: 10.4 x 5.4 x 4.9 cm = volume: 142 mL . Echogenicity within normal limits. No mass or hydronephrosis  visualized. Left Kidney: Renal measurements: 11.5 x 6.0 x 5.1 cm = volume: 181 mL. Echogenicity within normal limits. No mass or hydronephrosis visualized. Bladder: Foley catheter in place, decompressed. The previously seen adrenal nodule/adenoma noted, measuring 3.6 x 3.6 x 2.3 cm. IMPRESSION: No acute renal abnormality.  No hydronephrosis. Electronically Signed   By: Rolm Baptise M.D.   On:  05/21/2018 22:20   Ir Fluoro Guide Cv Line Right  Result Date: 05/31/2018 INDICATION: ACUTE RENAL FAILURE, NO CURRENT PERMANENT ACCESS EXAM: ULTRASOUND GUIDANCE FOR VASCULAR ACCESS RIGHT INTERNAL JUGULAR PERMANENT HEMODIALYSIS CATHETER Date:  05/31/2018 05/31/2018 2:24 pm Radiologist:  M. Daryll Brod, MD Guidance:  ULTRASOUND AND FLUOROSCOPIC FLUOROSCOPY TIME:  Fluoroscopy Time: 0 minutes 18 seconds (5 mGy). MEDICATIONS: ANCEF 2 G ADMINISTERED WITHIN 1 HOUR OF THE PROCEDURE ANESTHESIA/SEDATION: Versed 1.0 mg IV; Fentanyl 25 mcg IV; Moderate Sedation Time:  21 minutes The patient was continuously monitored during the procedure by the interventional radiology nurse under my direct supervision. CONTRAST:  None. COMPLICATIONS: None immediate. PROCEDURE: Informed consent was obtained from the patient following explanation of the procedure, risks, benefits and alternatives. The patient understands, agrees and consents for the procedure. All questions were addressed. A time out was performed. Maximal barrier sterile technique utilized including caps, mask, sterile gowns, sterile gloves, large sterile drape, hand hygiene, and 2% chlorhexidine scrub. Under sterile conditions and local anesthesia, right internal jugular micropuncture venous access was performed with ultrasound. Images were obtained for documentation of the patent right internal jugular vein. A guide wire was inserted followed by a transitional dilator. Next, a 0.035 guidewire was advanced into the IVC with a 5-French catheter. Measurements were obtained from the right venotomy site to the proximal right atrium. In the right infraclavicular chest, a subcutaneous tunnel was created under sterile conditions and local anesthesia. 1% lidocaine with epinephrine was utilized for this. The 19 cm tip to cuff dialysis catheter was tunneled subcutaneously to the venotomy site and inserted into the SVC/RA junction through a valved peel-away sheath. Position was confirmed  with fluoroscopy. Images were obtained for documentation. Blood was aspirated from the catheter followed by saline and heparin flushes. The appropriate volume and strength of heparin was instilled in each lumen. Caps were applied. The catheter was secured at the tunnel site with Gelfoam and a pursestring suture. The venotomy site was closed with subcuticular Vicryl suture. Dermabond was applied to the small right neck incision. A dry sterile dressing was applied. The catheter is ready for use. No immediate complications. IMPRESSION: Ultrasound and fluoroscopically guided right internal jugular tunneled hemodialysis catheter (19 cm tip to cuff dialysis catheter). Electronically Signed   By: Jerilynn Mages.  Shick M.D.   On: 05/31/2018 14:32   Ir US Guide Vasc Access Right  Result Date: 05/31/2018 INDICATION: ACUTE RENAL FAILURE, NO CURRENT PERMANENT ACCESS EXAM: ULTRASOUND GUIDANCE FOR VASCULAR ACCESS RIGHT INTERNAL JUGULAR PERMANENT HEMODIALYSIS CATHETER Date:  05/31/2018 05/31/2018 2:24 pm Radiologist:  M. Daryll Brod, MD Guidance:  ULTRASOUND AND FLUOROSCOPIC FLUOROSCOPY TIME:  Fluoroscopy Time: 0 minutes 18 seconds (5 mGy). MEDICATIONS: ANCEF 2 G ADMINISTERED WITHIN 1 HOUR OF THE PROCEDURE ANESTHESIA/SEDATION: Versed 1.0 mg IV; Fentanyl 25 mcg IV; Moderate Sedation Time:  21 minutes The patient was continuously monitored during the procedure by the interventional radiology nurse under my direct supervision. CONTRAST:  None. COMPLICATIONS: None immediate. PROCEDURE: Informed consent was obtained from the patient following explanation of the procedure, risks, benefits and alternatives. The patient understands, agrees and consents for the procedure. All questions were  addressed. A time out was performed. Maximal barrier sterile technique utilized including caps, mask, sterile gowns, sterile gloves, large sterile drape, hand hygiene, and 2% chlorhexidine scrub. Under sterile conditions and local anesthesia, right internal jugular  micropuncture venous access was performed with ultrasound. Images were obtained for documentation of the patent right internal jugular vein. A guide wire was inserted followed by a transitional dilator. Next, a 0.035 guidewire was advanced into the IVC with a 5-French catheter. Measurements were obtained from the right venotomy site to the proximal right atrium. In the right infraclavicular chest, a subcutaneous tunnel was created under sterile conditions and local anesthesia. 1% lidocaine with epinephrine was utilized for this. The 19 cm tip to cuff dialysis catheter was tunneled subcutaneously to the venotomy site and inserted into the SVC/RA junction through a valved peel-away sheath. Position was confirmed with fluoroscopy. Images were obtained for documentation. Blood was aspirated from the catheter followed by saline and heparin flushes. The appropriate volume and strength of heparin was instilled in each lumen. Caps were applied. The catheter was secured at the tunnel site with Gelfoam and a pursestring suture. The venotomy site was closed with subcuticular Vicryl suture. Dermabond was applied to the small right neck incision. A dry sterile dressing was applied. The catheter is ready for use. No immediate complications. IMPRESSION: Ultrasound and fluoroscopically guided right internal jugular tunneled hemodialysis catheter (19 cm tip to cuff dialysis catheter). Electronically Signed   By: Jerilynn Mages.  Shick M.D.   On: 05/31/2018 14:32   Dg Chest Port 1 View  Result Date: 05/26/2018 CLINICAL DATA:  Post bronchoscopy. EXAM: PORTABLE CHEST 1 VIEW COMPARISON:  05/22/2018. FINDINGS: Right IJ line noted with tip over superior vena cava. NG tube noted with tip below left hemidiaphragm. Cardiomegaly with bilateral pulmonary infiltrates/edema left side greater than right consistent with CHF. Bilateral pneumonia can not be excluded. Findings have progressed from prior exam. Small left pleural effusion. No pneumothorax.  IMPRESSION: Lines and tubes in stable position. Congestive heart failure bilateral pulmonary edema left side greater right and left-sided pleural effusion. Findings consistent CHF. Bilateral pneumonia can not be excluded. Findings have progressed from prior exam. Electronically Signed   By: [place]   On: 05/26/2018 14:02   Dg Chest Port 1 View  Result Date: 05/22/2018 CLINICAL DATA:  Respiratory failure EXAM: PORTABLE CHEST 1 VIEW COMPARISON:  1 day prior FINDINGS: Endotracheal tube is unchanged in position. Nasogastric tube extends beyond the inferior aspect of the film. Right internal jugular line unchanged with tip at mid to low SVC. Cardiomegaly accentuated by AP portable technique. No right-sided pleural effusion. Cannot exclude small left pleural effusion. No pneumothorax. Improved inspiratory effort and aeration. No overt congestive failure. Retrocardiac left lower lobe airspace disease is felt to be slightly improved. IMPRESSION: Improved aeration, with resolved interstitial edema and decreased left base opacity. Suspect residual small left pleural effusion. Appropriate position of support apparatus. Electronically Signed   By: Abigail Miyamoto M.D.   On: 05/22/2018 14:59   Dg Chest Port 1 View  Result Date: 05/21/2018 CLINICAL DATA:  Central line placement. EXAM: PORTABLE CHEST 1 VIEW COMPARISON:  Earlier same day FINDINGS: 2119 hours. Endotracheal tube tip is 3 cm above the base of the carina. The NG tube passes into the stomach although the distal tip position is not included on the film. Right IJ central line tip overlies the proximal SVC. No evidence for pneumothorax or substantial pleural effusion. Lung volumes are low. The cardio pericardial silhouette is enlarged.  Persistent retrocardiac left base collapse/consolidation. There is pulmonary vascular congestion without overt pulmonary edema. Telemetry leads overlie the chest. IMPRESSION: 1. Right IJ central line placement with tip  overlying the proximal SVC level. No pneumothorax or substantial pleural effusion. 2. Cardiomegaly with vascular congestion and low lung volumes. 3. Similar appearance of the retrocardiac left base collapse/consolidation. Electronically Signed   By: Misty Stanley M.D.   On: 05/21/2018 21:45   Dg Chest Port 1 View  Result Date: 05/21/2018 CLINICAL DATA:  Ventilator dependent. EXAM: PORTABLE CHEST 1 VIEW COMPARISON:  05/21/2018 FINDINGS: Endotracheal tube is 3 cm above the carina. NG tube enters the stomach. Cardiomegaly with vascular congestion. Left lower lobe airspace opacity again noted, unchanged. No focal opacity on the right. No visible significant effusions or acute bony abnormality. IMPRESSION: Left lower lobe airspace opacity, stable. Mild cardiomegaly and vascular congestion. Electronically Signed   By: Rolm Baptise M.D.   On: 05/21/2018 20:38   Dg Chest Portable 1 View  Result Date: 05/21/2018 CLINICAL DATA:  This post intubation EXAM: PORTABLE CHEST 1 VIEW COMPARISON:  05/13/2018 FINDINGS: Endotracheal to is in the right mainstem bronchus. Recommend retracting approximately 2-3 cm. Cardiomegaly. Low lung volumes. Increasing left perihilar and lower lobe airspace opacity which may reflect atelectasis from the right mainstem intubation. Vascular congestion. No visible effusions or acute bony abnormality. IMPRESSION: Endotracheal tube just into the right mainstem bronchus. Recommend retracting 2-3 cm. Increasing left perihilar and lower lobe opacity could reflect associated atelectasis related to right mainstem intubation. Vascular congestion, low lung volumes. These results were called by telephone at the time of interpretation on 05/21/2018 at 4:08 pm to Dr. Nat Christen , who verbally acknowledged these results. Electronically Signed   By: Rolm Baptise M.D.   On: 05/21/2018 16:08   PATHOLOGY:    Diagnosis Endobronchial biopsy, Left Lower Lobe - TYPICAL CARCINOID TUMOR. - ATYPICAL SQUAMOUS  METAPLASIA, FAVOR REACTIVE. - NO MITOTIC ACTIVITY OR NECROSIS ARE IDENTIFIED - SEE NOTE. Diagnosis Note Immunostains show that the tumor cells are positive for synaptophysin and chromogranin, consistent with the above diagnosis. Ki-67 shows a proliferative index of less than 1%. Dr. Saralyn Pilar has reviewed this case and concurs with the above interpretation. Dr. Valeta Harms was paged on 05/30/2018. (NK:kh 05/30/2018) Jaquita Folds MD Pathologist, Electronic Signature (Case signed 05/30/2018)  Assessment and Plan:   This is a 63 year old female with:  1.  Typical carcinoid tumor: Management is typically surgery.  Will discuss with Dr. Irene Limbo regarding consultation with cardiothoracic surgery.  If she is not a surgical candidate, may consider a referral to Radiation Oncology.  2.  Anemia: Likely due to her renal disease.  Hemoglobin is 9.3 today.  No transfusion is indicated.  She is currently on Aranesp.  3.  Kidney disease: Remains oliguric and dialysis dependent.  Management per nephrology.   Thank you for this referral.   Mikey Bussing, DNP, AGPCNP-BC, AOCNP   ADDENDUM  .Patient was Personally and independently interviewed, examined and relevant elements of the history of present illness were reviewed in details and an assessment and plan was created. All elements of the patient's history of present illness , assessment and plan were discussed in details with Mikey Bussing. The above documentation reflects our combined findings assessment and plan.  63 year old female with with newly diagnosed bronchopulmonary carcinoid with possible community-acquired versus postobstructive pneumonia. Recommendations -Would focus on aggressive treatment and clearance of the patient's pneumonia at this time. -Patient will need staging with outpatient gallium dotatate PET CT scan  to completely evaluate her carcinoid to determine stage of her disease and if metastatic candidacy for radioactive peptide-based  treatments. -Would get urinary 24-hour 5 HIAA levels, serum chromogranin, neuron-specific amylase evaluations. -If the patient has significant signs of carcinoid syndrome including severe flushing or diarrhea might need to be started on long-acting octreotide. -After treatment of the pneumonia and imaging studies if the patient has localized disease the options would include observation if asymptomatic versus octreotide versus surgery. -If she has metastatic disease -if asymptomatic would watch however if the patient has pulmonary symptoms might consider octreotide versus radioactive peptide based treatment. -She will also likely need further characterization of her adrenal tumor with a possible biopsy based on goals of care. -Given bronchopulmonary carcinoid can potentially be associated with MEN1 syndrome patient would benefit from outpatient genetic counselor referral at the cancer center. -If she has MEN1 syndrome might need additional biochemical evaluation to rule out other associated conditions. -Not unreasonable to involve cardiothoracic surgery at this time given potential obstructive lesion to determine if she is a surgical candidate.  Sullivan Lone MD MS

## 2018-06-01 NOTE — Progress Notes (Signed)
PT Cancellation Note  Patient Details Name: [name] MRN: [id number] DOB: [birth year]   Cancelled Treatment:    Reason Eval/Treat Not Completed: Patient at procedure or test/unavailable (HD). Will follow-up for PT treatment as schedule permits.  Mabeline Caras, PT, DPT Acute Rehabilitation Services  Pager [phone] Office [place] 06/01/2018, 7:47 AM

## 2018-06-02 ENCOUNTER — Other Ambulatory Visit: Payer: Self-pay

## 2018-06-02 ENCOUNTER — Ambulatory Visit (HOSPITAL_COMMUNITY): Payer: Medicare Other

## 2018-06-02 ENCOUNTER — Encounter: Payer: Self-pay | Admitting: *Deleted

## 2018-06-02 ENCOUNTER — Other Ambulatory Visit: Payer: Self-pay | Admitting: *Deleted

## 2018-06-02 DIAGNOSIS — Z01810 Encounter for preprocedural cardiovascular examination: Secondary | ICD-10-CM

## 2018-06-02 DIAGNOSIS — D3A090 Benign carcinoid tumor of the bronchus and lung: Secondary | ICD-10-CM

## 2018-06-02 DIAGNOSIS — J449 Chronic obstructive pulmonary disease, unspecified: Secondary | ICD-10-CM | POA: Diagnosis not present

## 2018-06-02 DIAGNOSIS — M6281 Muscle weakness (generalized): Secondary | ICD-10-CM | POA: Diagnosis not present

## 2018-06-02 DIAGNOSIS — I4891 Unspecified atrial fibrillation: Secondary | ICD-10-CM

## 2018-06-02 DIAGNOSIS — R296 Repeated falls: Secondary | ICD-10-CM | POA: Diagnosis not present

## 2018-06-02 LAB — CBC
HCT: 25.6 % — ABNORMAL LOW (ref 36.0–46.0)
Hemoglobin: 7.9 g/dL — ABNORMAL LOW (ref 12.0–15.0)
MCH: 28.5 pg (ref 26.0–34.0)
MCHC: 30.9 g/dL (ref 30.0–36.0)
MCV: 92.4 fL (ref 80.0–100.0)
Platelets: 259 10*3/uL (ref 150–400)
RBC: 2.77 MIL/uL — ABNORMAL LOW (ref 3.87–5.11)
RDW: 13.7 % (ref 11.5–15.5)
WBC: 7.4 10*3/uL (ref 4.0–10.5)
nRBC: 0 % (ref 0.0–0.2)

## 2018-06-02 LAB — RENAL FUNCTION PANEL
Albumin: 2.3 g/dL — ABNORMAL LOW (ref 3.5–5.0)
Anion gap: 10 (ref 5–15)
BUN: 27 mg/dL — ABNORMAL HIGH (ref 8–23)
CO2: 27 mmol/L (ref 22–32)
Calcium: 7.8 mg/dL — ABNORMAL LOW (ref 8.9–10.3)
Chloride: 96 mmol/L — ABNORMAL LOW (ref 98–111)
Creatinine, Ser: 3.02 mg/dL — ABNORMAL HIGH (ref 0.44–1.00)
GFR calc Af Amer: 18 mL/min — ABNORMAL LOW (ref >60)
GFR calc non Af Amer: 16 mL/min — ABNORMAL LOW (ref >60)
Glucose, Bld: 118 mg/dL — ABNORMAL HIGH (ref 70–99)
Phosphorus: 3.1 mg/dL (ref 2.5–4.6)
Potassium: 3.5 mmol/L (ref 3.5–5.1)
Sodium: 133 mmol/L — ABNORMAL LOW (ref 135–145)

## 2018-06-02 LAB — HEPATIC FUNCTION PANEL
ALT: 9 U/L (ref 0–44)
AST: 20 U/L (ref 15–41)
Albumin: 2.3 g/dL — ABNORMAL LOW (ref 3.5–5.0)
Alkaline Phosphatase: 49 U/L (ref 38–126)
Bilirubin, Direct: 0.1 mg/dL (ref 0.0–0.2)
Indirect Bilirubin: 0.3 mg/dL (ref 0.3–0.9)
Total Bilirubin: 0.4 mg/dL (ref 0.3–1.2)
Total Protein: 6.5 g/dL (ref 6.5–8.1)

## 2018-06-02 LAB — GLUCOSE, CAPILLARY
Glucose-Capillary: 133 mg/dL — ABNORMAL HIGH (ref 70–99)
Glucose-Capillary: 167 mg/dL — ABNORMAL HIGH (ref 70–99)
Glucose-Capillary: 148 mg/dL — ABNORMAL HIGH (ref 70–99)
Glucose-Capillary: 211 mg/dL — ABNORMAL HIGH (ref 70–99)

## 2018-06-02 LAB — HEPARIN LEVEL (UNFRACTIONATED)
Heparin Unfractionated: 0.1 IU/mL — ABNORMAL LOW (ref 0.30–0.70)
Heparin Unfractionated: 0.23 [IU]/mL — ABNORMAL LOW (ref 0.30–0.70)
Heparin Unfractionated: <0.1 IU/mL — ABNORMAL LOW (ref 0.30–0.70)

## 2018-06-02 LAB — LIPID PANEL
Cholesterol: 141 mg/dL (ref 0–200)
HDL: 32 mg/dL — ABNORMAL LOW (ref >40)
LDL Cholesterol: 50 mg/dL (ref 0–99)
Total CHOL/HDL Ratio: 4.4 RATIO
Triglycerides: 295 mg/dL — ABNORMAL HIGH (ref <150)
VLDL: 59 mg/dL — ABNORMAL HIGH (ref 0–40)

## 2018-06-02 MED ORDER — HEPARIN BOLUS VIA INFUSION
2000.0000 [IU] | Freq: Once | INTRAVENOUS | Status: AC
  Administered 2018-06-02: 2000 [IU] via INTRAVENOUS
  Filled 2018-06-02: qty 2000

## 2018-06-02 MED ORDER — HYDRALAZINE HCL 25 MG PO TABS
25.0000 mg | ORAL_TABLET | Freq: Three times a day (TID) | ORAL | Status: DC
  Administered 2018-06-02 – 2018-06-09 (×18): 25 mg via ORAL
  Filled 2018-06-02 (×21): qty 1

## 2018-06-02 MED ORDER — HEPARIN BOLUS VIA INFUSION
2000.0000 [IU] | Freq: Once | INTRAVENOUS | Status: AC
  Administered 2018-06-03: 2000 [IU] via INTRAVENOUS
  Filled 2018-06-02: qty 2000

## 2018-06-02 MED ORDER — CHLORHEXIDINE GLUCONATE CLOTH 2 % EX PADS: 6.0000 | MEDICATED_PAD | Freq: Every day | CUTANEOUS | Status: DC

## 2018-06-02 NOTE — Care Management Important Message (Signed)
Important Message  Patient Details  Name: [name] MRN: [id number] Date of Birth: Aug 29, 1955   Medicare Important Message Given:  Yes    [name] P [place] 06/02/2018, 1:28 PM

## 2018-06-02 NOTE — Progress Notes (Signed)
The proposed treatment discussed at cancer conference 06/02/2018 is for discussion purpose only and not a binding recommendation.  The patient was not physically examined nor present for their treatment options.  Therefore, final treatment plans cannot be decided.

## 2018-06-02 NOTE — Progress Notes (Signed)
VASCULAR LAB PRELIMINARY  PRELIMINARY  PRELIMINARY  PRELIMINARY  Vein mapping completed.    Preliminary report:  See CV proc for results  Madora Barletta, RVT 06/02/2018, 1:57 PM

## 2018-06-02 NOTE — Progress Notes (Signed)
ANTICOAGULATION CONSULT NOTE  Pharmacy Consult for heparin Indication: atrial fibrillation  No Known Allergies  Patient Measurements: Height: 5\' 4"  (162.6 cm) Weight: 257 lb 8 oz (116.8 kg) IBW/kg (Calculated) : 54.7 Heparin Dosing Weight: 82.9 kg  Vital Signs: Temp: 97.7 F (36.5 C) (03/04 2103) Temp Source: Oral (03/04 2103) BP: 139/71 (03/04 2103) Pulse Rate: 67 (03/04 2108)  Labs: Recent Labs    05/30/18 1015 05/31/18 0609 06/01/18 0335 06/01/18 1226 06/02/18 0029  HGB  --  7.8* 8.3* 9.3* 7.9*  HCT  --  26.1* 27.6* 29.9* 25.6*  PLT  --  242 281 303 259  LABPROT 20.0* 14.3  --   --   --   INR 1.7* 1.1  --   --   --   HEPARINUNFRC  --   --   --   --  <0.10*  CREATININE  --  3.70* 4.44* 2.00* 3.02*    Estimated Creatinine Clearance: 24.2 mL/min (A) (by C-G formula based on SCr of 3.02 mg/dL (H)).   Assessment: 63 y.o. female with Afib for heparin  Goal of Therapy:  Heparin level 0.3-0.7 units/ml Monitor platelets by anticoagulation protocol: Yes   Plan:  Heparin 2000 units IV bolus, then increase heparin  1450 units/hr Check heparin level in 8 hours.  Phillis Knack, PharmD, BCPS  06/02/2018,2:18 AM

## 2018-06-02 NOTE — Social Work (Addendum)
2:35 pm Patient's guardian requesting clinical information to review in consideration of changing patient's code status, per palliative discussion earlier this week. Directed guardian to medical records.   9:48 am PASRR completed. [id number] E valid 3/4 - 07/01/2018. CSW to follow for disposition planning.  Estanislado Emms, LCSW [phone]

## 2018-06-02 NOTE — Progress Notes (Signed)
ANTICOAGULATION CONSULT NOTE  Pharmacy Consult for heparin Indication: atrial fibrillation  No Known Allergies  Patient Measurements: Height: 5\' 4"  (162.6 cm) Weight: 258 lb 11.2 oz (117.3 kg) IBW/kg (Calculated) : 54.7 Heparin Dosing Weight: 82.9 kg  Vital Signs: Temp: 97.6 F (36.4 C) (03/05 0511) Temp Source: Oral (03/05 0511) BP: 170/84 (03/05 1124) Pulse Rate: 68 (03/05 1124)  Labs: Recent Labs    05/31/18 0609 06/01/18 0335 06/01/18 1226 06/02/18 0029 06/02/18 1000  HGB 7.8* 8.3* 9.3* 7.9*  --   HCT 26.1* 27.6* 29.9* 25.6*  --   PLT 242 281 303 259  --   LABPROT 14.3  --   --   --   --   INR 1.1  --   --   --   --   HEPARINUNFRC  --   --   --  <0.10* 0.10*  CREATININE 3.70* 4.44* 2.00* 3.02*  --     Estimated Creatinine Clearance: 24.3 mL/min (A) (by C-G formula based on SCr of 3.02 mg/dL (H)).   Assessment: 63 y.o. female with newly recognized atrial fibrillation started on heparin drip per pharmacy. PMH includes HTN, DM, vascular disease. Plans for possible Eliquis initiation prior to discharge for long term anticoagulation.   Heparin level suptheraputic at 0.1  CBCs stable, no s/sx of bleeding or problems with heparin drip per nurse  Goal of Therapy:  Heparin level 0.3-0.7 units/ml Monitor platelets by anticoagulation protocol: Yes   Plan:  Rebolus with heparin 2000 units Increase heparin 1750 units/hr Check heparin level in 8 hours, then daily  Monitor s/sx of bleeding and CBCs  Gwenlyn Found, [place] D PGY1 Pharmacy Resident  Phone [phone] 06/02/2018   1:12 PM

## 2018-06-02 NOTE — Progress Notes (Signed)
[name]  Assessment/ Plan: Pt is a 63 y.o. yo female with hypertension, diabetes, CKD 3 admitted with pneumonia, sepsis and possible aortic fall marantic lesion, AKI requiring CVVH in the beginning now on intermittent hemo-.  #AKI on CKD 3 in the setting of sepsis, concomitant ARB and possible cardiorenal syndrome.  Patient received CRRT from 2/22 and transition to intermittent hemo-on 2/24.  Patient remains oliguric and dialysis dependent.  -Status post TDC by IR on 3/3 -Vein mapping ordered, VVS consulted for AVF placement -Dialysis  yesterday tolerated well, UF 3 L.  Plan for next treatment tomorrow. -CLIP  #Aortic valve vegetation concern for culture-negative versus marantic AV lesion: TEE was done.  #Hyperkalemia: Improved  #Acute on chronic diastolic CHF: Now managing volume with dialysis.  #Anemia of CKD: Iron saturation 14%.  Continue IV iron and Aranesp.  Monitor CBC.    #Hypertension:   Monitor blood pressure.  Continue current cardiac medication.  #Hyperphosphatemia: PTH level 143.  Phosphorus level is low therefore discontinue Auryxia.  Subjective: Seen and examined.  Reports feeling good.  Denies headache, dizziness, nausea, vomiting, chest pain or shortness of breath.  Objective Vital signs in last 24 hours: Vitals:   06/02/18 0815 06/02/18 0816 06/02/18 0822 06/02/18 1124  BP:   (!) 185/84 (!) 170/84  Pulse:  71 72 68  Resp:  18  16  Temp:      TempSrc:      SpO2: 95% 95%  98%  Weight:      Height:       Weight change: -2.677 kg  Intake/Output Summary (Last 24 hours) at 06/02/2018 1235 Last data filed at 06/02/2018 1200 Gross per 24 hour  Intake 1425.63 ml  Output 75 ml  Net 1350.63 ml       Labs: Basic Metabolic Panel: Recent Labs  Lab 06/01/18 0335 06/01/18 1226 06/02/18 0029  NA 134* 136 133*  K 3.7 3.6 3.5  CL 96* 98 96*  CO2 24 27 27   GLUCOSE 137* 117* 118*  BUN 55* 16 27*  CREATININE 4.44*  2.00* 3.02*  CALCIUM 8.2* 8.0* 7.8*  PHOS 6.8* 2.6 3.1   Liver Function Tests: Recent Labs  Lab 06/01/18 0335 06/01/18 1226 06/02/18 0029  AST  --   --  20  ALT  --   --  9  ALKPHOS  --   --  49  BILITOT  --   --  0.4  PROT  --   --  6.5  ALBUMIN 2.4* 2.6* 2.3*  2.3*   No results for input(s): LIPASE, AMYLASE in the last 168 hours. No results for input(s): AMMONIA in the last 168 hours. CBC: Recent Labs  Lab 05/30/18 0657 05/31/18 0609 06/01/18 0335 06/01/18 1226 06/02/18 0029  WBC 7.2 6.2 7.6 6.8 7.4  HGB 7.6* 7.8* 8.3* 9.3* 7.9*  HCT 26.2* 26.1* 27.6* 29.9* 25.6*  MCV 94.6 93.9 91.4 90.9 92.4  PLT 235 242 281 303 259   Cardiac Enzymes: No results for input(s): CKTOTAL, CKMB, CKMBINDEX, TROPONINI in the last 168 hours. CBG: Recent Labs  Lab 06/01/18 1146 06/01/18 1711 06/01/18 2105 06/02/18 0654 06/02/18 1130  GLUCAP 108* 355* 177* 133* 167*    Iron Studies:  Recent Labs    05/31/18 0609  IRON 35  TIBC 258  FERRITIN 266   Studies/Results: Ir Fluoro Guide Cv Line Right  Result Date: 05/31/2018 INDICATION: ACUTE RENAL FAILURE, NO CURRENT PERMANENT ACCESS EXAM: ULTRASOUND GUIDANCE FOR VASCULAR ACCESS RIGHT  INTERNAL JUGULAR PERMANENT HEMODIALYSIS CATHETER Date:  05/31/2018 05/31/2018 2:24 pm Radiologist:  M. Daryll Brod, MD Guidance:  ULTRASOUND AND FLUOROSCOPIC FLUOROSCOPY TIME:  Fluoroscopy Time: 0 minutes 18 seconds (5 mGy). MEDICATIONS: ANCEF 2 G ADMINISTERED WITHIN 1 HOUR OF THE PROCEDURE ANESTHESIA/SEDATION: Versed 1.0 mg IV; Fentanyl 25 mcg IV; Moderate Sedation Time:  21 minutes The patient was continuously monitored during the procedure by the interventional radiology nurse under my direct supervision. CONTRAST:  None. COMPLICATIONS: None immediate. PROCEDURE: Informed consent was obtained from the patient following explanation of the procedure, risks, benefits and alternatives. The patient understands, agrees and consents for the procedure. All questions  were addressed. A time out was performed. Maximal barrier sterile technique utilized including caps, mask, sterile gowns, sterile gloves, large sterile drape, hand hygiene, and 2% chlorhexidine scrub. Under sterile conditions and local anesthesia, right internal jugular micropuncture venous access was performed with ultrasound. Images were obtained for documentation of the patent right internal jugular vein. A guide wire was inserted followed by a transitional dilator. Next, a 0.035 guidewire was advanced into the IVC with a 5-French catheter. Measurements were obtained from the right venotomy site to the proximal right atrium. In the right infraclavicular chest, a subcutaneous tunnel was created under sterile conditions and local anesthesia. 1% lidocaine with epinephrine was utilized for this. The 19 cm tip to cuff dialysis catheter was tunneled subcutaneously to the venotomy site and inserted into the SVC/RA junction through a valved peel-away sheath. Position was confirmed with fluoroscopy. Images were obtained for documentation. Blood was aspirated from the catheter followed by saline and heparin flushes. The appropriate volume and strength of heparin was instilled in each lumen. Caps were applied. The catheter was secured at the tunnel site with Gelfoam and a pursestring suture. The venotomy site was closed with subcuticular Vicryl suture. Dermabond was applied to the small right neck incision. A dry sterile dressing was applied. The catheter is ready for use. No immediate complications. IMPRESSION: Ultrasound and fluoroscopically guided right internal jugular tunneled hemodialysis catheter (19 cm tip to cuff dialysis catheter). Electronically Signed   By: Jerilynn Mages.  Shick M.D.   On: 05/31/2018 14:32   Ir US Guide Vasc Access Right  Result Date: 05/31/2018 INDICATION: ACUTE RENAL FAILURE, NO CURRENT PERMANENT ACCESS EXAM: ULTRASOUND GUIDANCE FOR VASCULAR ACCESS RIGHT INTERNAL JUGULAR PERMANENT HEMODIALYSIS CATHETER  Date:  05/31/2018 05/31/2018 2:24 pm Radiologist:  M. Daryll Brod, MD Guidance:  ULTRASOUND AND FLUOROSCOPIC FLUOROSCOPY TIME:  Fluoroscopy Time: 0 minutes 18 seconds (5 mGy). MEDICATIONS: ANCEF 2 G ADMINISTERED WITHIN 1 HOUR OF THE PROCEDURE ANESTHESIA/SEDATION: Versed 1.0 mg IV; Fentanyl 25 mcg IV; Moderate Sedation Time:  21 minutes The patient was continuously monitored during the procedure by the interventional radiology nurse under my direct supervision. CONTRAST:  None. COMPLICATIONS: None immediate. PROCEDURE: Informed consent was obtained from the patient following explanation of the procedure, risks, benefits and alternatives. The patient understands, agrees and consents for the procedure. All questions were addressed. A time out was performed. Maximal barrier sterile technique utilized including caps, mask, sterile gowns, sterile gloves, large sterile drape, hand hygiene, and 2% chlorhexidine scrub. Under sterile conditions and local anesthesia, right internal jugular micropuncture venous access was performed with ultrasound. Images were obtained for documentation of the patent right internal jugular vein. A guide wire was inserted followed by a transitional dilator. Next, a 0.035 guidewire was advanced into the IVC with a 5-French catheter. Measurements were obtained from the right venotomy site to the proximal right atrium. In  the right infraclavicular chest, a subcutaneous tunnel was created under sterile conditions and local anesthesia. 1% lidocaine with epinephrine was utilized for this. The 19 cm tip to cuff dialysis catheter was tunneled subcutaneously to the venotomy site and inserted into the SVC/RA junction through a valved peel-away sheath. Position was confirmed with fluoroscopy. Images were obtained for documentation. Blood was aspirated from the catheter followed by saline and heparin flushes. The appropriate volume and strength of heparin was instilled in each lumen. Caps were applied. The  catheter was secured at the tunnel site with Gelfoam and a pursestring suture. The venotomy site was closed with subcuticular Vicryl suture. Dermabond was applied to the small right neck incision. A dry sterile dressing was applied. The catheter is ready for use. No immediate complications. IMPRESSION: Ultrasound and fluoroscopically guided right internal jugular tunneled hemodialysis catheter (19 cm tip to cuff dialysis catheter). Electronically Signed   By: Jerilynn Mages.  Shick M.D.   On: 05/31/2018 14:32    Medications: Infusions: . sodium chloride 10 mL/hr at 05/30/18 0830  . [START ON 06/03/2018] ferric gluconate (FERRLECIT/NULECIT) IV    . heparin 1,450 Units/hr (06/02/18 1200)    Scheduled Medications: . arformoterol  15 mcg Nebulization BID  . budesonide (PULMICORT) nebulizer solution  0.5 mg Nebulization BID  . Chlorhexidine Gluconate Cloth  6 each Topical Q0600  . darbepoetin (ARANESP) injection - DIALYSIS  60 mcg Intravenous Q Wed-HD  . diltiazem  240 mg Oral Daily  . feeding supplement (NEPRO CARB STEADY)  237 mL Oral BID BM  . ferric citrate  420 mg Oral TID WC  . hydrALAZINE  25 mg Oral Q8H  . insulin aspart  0-5 Units Subcutaneous QHS  . insulin aspart  0-9 Units Subcutaneous TID WC  . ipratropium-albuterol  3 mL Nebulization TID  . isosorbide dinitrate  10 mg Oral TID  . metoprolol succinate  100 mg Oral Daily  . multivitamin  1 tablet Oral QHS    have reviewed scheduled and prn medications.  Physical Exam: General: Not in distress Heart:RRR, s1s2 nl, no rubs Lungs: Clear bilateral, no wheezing Abdomen:soft, Non-tender, non-distended Extremities: Lower extremity pitting edema no significant change. Dialysis Access: Tunneled catheter site looks clean  [name] Aubreyana Saltz 06/02/2018,12:35 PM  LOS: 12 days

## 2018-06-02 NOTE — Progress Notes (Signed)
PROGRESS NOTE    [name]  XNT:[id number] DOB: 09/10/1955 DOA: 05/21/2018 PCP: Rosita Fire, MD   Brief Narrative:  HPI on 05/21/2018 by [name] 63 year old female presents from [place] to [facility] with reported one day of respiratory distress, placed on BiPAP by EMS. On arrival to ED found to be in [place]. Given Amiodarone bolus Cardiology consulted. Preparing for cardioversion, however patient converted into wide complex tachycardia. Intubated, during intubation had notable emesis. Given Vancomycin/Zosyn. K 7.5, Crt 4.31. Administered Bicarb, Calcium, Albuterol, Insulin/Dextrose. ABG 7.15/45. LA 3.2. Transferred to [facility] for further treatment.   Recent admission 2/14-2/17 with CAP. Noted to have AKI which improved. At discharge Cr 1.63. CT Chest concerning for neoplastic process of left lower lung base.   Interim history Patient was found to have severe dyspnea requiring BiPAP by EMS.  On arrival in the ER, she was found to be in V. tach.  Patient was given an amiodarone bolus and then developed unstable wide-complex tachycardia and acute respiratory failure.  Patient was admitted by [facility] and was in the ICU and intubated.  TRH is assumed care. 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 -Oncology consulted and appreciated  Acute renal failure on chronic kidney disease, stage III/hyperkalemia/metabolic acidosis/lactic acidosis -Creatinine baseline 1.3-1.6,  now she is dialysis dependent -Nephrology consulted and appreciated, pending further recommendations- transitioning to intermittent hemo -Neurosurgery consulted for [facility]  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 -1 cm mobile AV vegetation -Blood cultures unremarkable x4 -Infectious disease consulted and appreciated, doubt culture-negative endocarditis but rather that this is either marantic or simply a calcified nodule  Acute on chronic diastolic CHF/hypertension -Patient appears to be volume overloaded, currently dialyzing for volume control -She failed Lasix therapy -BP stable -Continue aspirin, Isordil, metoprolol, diltiazem  Ventricular tachycardia/junctional rhythm in the setting of hyperkalemia -Resolved, continue metoprolol  Atrial fibrillation with RVR, paroxysmal -Cardiology consulted and appreciated -Patient seems to have also developed self-limited A. fib post bronchoscopy however overnight patient had repeat episode of atrial fibrillation with heart rate in the 130s -Patient was given diltiazem  Sleep apnea -Continue BiPAP  Diabetes mellitus, type II -Continue insulin sliding scale with CBG monitoring  History of asthma -Continue budesonide, aformoterol, DuoNebs  Normocytic anemia  -hemoglobin 7.9 today -anemia panel: Iron 35, ferritin 266 -continue to monitor CBC  DVT Prophylaxis  Heparin  Code Status: Full  Family Communication: None at bedside  Disposition Plan: Admitted. Dispo TBD  Consultants PCCM Cardiology Nephrology Interventional radiology Vascular surgery  Procedures  Intubation/Extubation Bronchoscopy  TEE  Antibiotics   Anti-infectives (From admission, onward)   Start     Dose/Rate Route Frequency Ordered Stop   05/31/18 0730  ceFAZolin (ANCEF) IVPB 2g/100 mL premix     2 g 200 mL/hr over 30 Minutes Intravenous To Radiology 05/31/18 0557 05/31/18 1412   05/30/18 0830  ceFAZolin (ANCEF) IVPB 2g/100 mL premix     2 g 200 mL/hr over 30  Minutes Intravenous To Radiology 05/30/18 0824 05/30/18 1614   05/28/18 1000  amoxicillin-clavulanate (AUGMENTIN) 500-125 MG per tablet 500 mg     1  tablet Oral Daily 05/28/18 0949 05/29/18 0935   05/27/18 1400  Ampicillin-Sulbactam (UNASYN) 3 g in sodium chloride 0.9 % 100 mL IVPB  Status:  Discontinued     3 g 200 mL/hr over 30 Minutes Intravenous Every 12 hours 05/27/18 1039 05/28/18 0949   05/27/18 1200  vancomycin (VANCOCIN) IVPB 1000 mg/200 mL premix  Status:  Discontinued     1,000 mg 200 mL/hr over 60 Minutes Intravenous Every M-W-F (Hemodialysis) 05/25/18 1111 05/26/18 1600   05/25/18 1800  vancomycin (VANCOCIN) IVPB 1000 mg/200 mL premix  Status:  Discontinued     1,000 mg 200 mL/hr over 60 Minutes Intravenous Every M-W-F (Hemodialysis) 05/25/18 1106 05/25/18 1111   05/25/18 1500  vancomycin (VANCOCIN) 1,500 mg in sodium chloride 0.9 % 500 mL IVPB     1,500 mg 250 mL/hr over 120 Minutes Intravenous  Once 05/25/18 1111 05/25/18 1843   05/25/18 1400  Ampicillin-Sulbactam (UNASYN) 3 g in sodium chloride 0.9 % 100 mL IVPB  Status:  Discontinued     3 g 200 mL/hr over 30 Minutes Intravenous Every 12 hours 05/25/18 1111 05/27/18 1039   05/25/18 1200  Ampicillin-Sulbactam (UNASYN) 3 g in sodium chloride 0.9 % 100 mL IVPB  Status:  Discontinued     3 g 200 mL/hr over 30 Minutes Intravenous Every 12 hours 05/25/18 1108 05/25/18 1111   05/25/18 1130  vancomycin (VANCOCIN) IVPB 1000 mg/200 mL premix  Status:  Discontinued     1,000 mg 200 mL/hr over 60 Minutes Intravenous  Once 05/25/18 1106 05/25/18 1111   05/24/18 0600  ceFEPIme (MAXIPIME) 1 g in sodium chloride 0.9 % 100 mL IVPB  Status:  Discontinued     1 g 200 mL/hr over 30 Minutes Intravenous Every 24 hours 05/23/18 0557 05/23/18 1156   05/24/18 0600  ampicillin-sulbactam (UNASYN) 1.5 g in sodium chloride 0.9 % 100 mL IVPB  Status:  Discontinued     1.5 g 200 mL/hr over 30 Minutes Intravenous Every 24 hours 05/23/18 1159 05/23/18  1204   05/23/18 2200  ampicillin-sulbactam (UNASYN) 1.5 g in sodium chloride 0.9 % 100 mL IVPB  Status:  Discontinued     1.5 g 200 mL/hr over 30 Minutes Intravenous Every 24 hours 05/23/18 1204 05/25/18 1108   05/23/18 2000  ampicillin-sulbactam (UNASYN) 1.5 g in sodium chloride 0.9 % 100 mL IVPB  Status:  Discontinued     1.5 g 200 mL/hr over 30 Minutes Intravenous Every 24 hours 05/23/18 1156 05/23/18 1159   05/23/18 0600  ceFEPIme (MAXIPIME) 2 g in sodium chloride 0.9 % 100 mL IVPB     2 g 200 mL/hr over 30 Minutes Intravenous  Once 05/23/18 0548 05/23/18 0702   05/23/18 0600  vancomycin (VANCOCIN) 2,000 mg in sodium chloride 0.9 % 500 mL IVPB     2,000 mg 250 mL/hr over 120 Minutes Intravenous  Once 05/23/18 0548 05/23/18 0933   05/23/18 0559  vancomycin variable dose per unstable renal function (pharmacist dosing)  Status:  Discontinued      Does not apply See admin instructions 05/23/18 0559 05/23/18 1156   05/22/18 2200  vancomycin (VANCOCIN) IVPB 1000 mg/200 mL premix  Status:  Discontinued     1,000 mg 200 mL/hr over 60 Minutes Intravenous Every 24 hours 05/21/18 2047 05/21/18 2206   05/21/18 1730  vancomycin (VANCOCIN) IVPB 1000 mg/200 mL premix  Status:  Discontinued     1,000 mg 200 mL/hr over 60 Minutes Intravenous  Once 05/21/18 1621 05/21/18 2206  05/21/18 1630  vancomycin (VANCOCIN) IVPB 1000 mg/200 mL premix     1,000 mg 200 mL/hr over 60 Minutes Intravenous  Once 05/21/18 1621 05/21/18 2309   05/21/18 1545  ceFEPIme (MAXIPIME) 2 g in sodium chloride 0.9 % 100 mL IVPB     2 g 200 mL/hr over 30 Minutes Intravenous  Once 05/21/18 1543 05/21/18 1745      Subjective:   [name] seen and examined today.  She has no complaints this morning.  Denies current chest pain, shortness of breath, abdominal pain, nausea or vomiting, diarrhea constipation.  Objective:   Vitals:   06/02/18 0815 06/02/18 0816 06/02/18 0822 06/02/18 1124  BP:   (!) 185/84 (!) 170/84    Pulse:  71 72 68  Resp:  18  16  Temp:      TempSrc:      SpO2: 95% 95%  98%  Weight:      Height:        Intake/Output Summary (Last 24 hours) at 06/02/2018 1328 Last data filed at 06/02/2018 1200 Gross per 24 hour  Intake 1425.63 ml  Output 75 ml  Net 1350.63 ml   Filed Weights   06/01/18 0700 06/01/18 1115 06/02/18 0511  Weight: 119.5 kg 116.8 kg 117.3 kg   Exam  General: Well developed, well nourished, NAD, appears stated age  70: NCAT, mucous membranes moist.   Neck: Supple  Cardiovascular: S1 S2 auscultated, RRR, no murmur  Respiratory: Diminished breath sounds  Abdomen: Soft, nontender, nondistended, + bowel sounds  Extremities: warm dry without cyanosis clubbing or edema  Neuro: AAOx3, nonfocal  Psych: Pleasant, appropriate mood and affect  Data Reviewed: I have personally reviewed following labs and imaging studies  CBC: Recent Labs  Lab 05/30/18 0657 05/31/18 0609 06/01/18 0335 06/01/18 1226 06/02/18 0029  WBC 7.2 6.2 7.6 6.8 7.4  HGB 7.6* 7.8* 8.3* 9.3* 7.9*  HCT 26.2* 26.1* 27.6* 29.9* 25.6*  MCV 94.6 93.9 91.4 90.9 92.4  PLT 235 242 281 303 947   Basic Metabolic Panel: Recent Labs  Lab 05/30/18 0657 05/31/18 0609 06/01/18 0335 06/01/18 1226 06/02/18 0029  NA 135 133* 134* 136 133*  K 4.4 3.6 3.7 3.6 3.5  CL 98 96* 96* 98 96*  CO2 _0 GLUCOSE 137* 124* 137* 117* 118*  BUN 73* 38* 55* 16 27*  CREATININE 5.79* 3.70* 4.44* 2.00* 3.02*  CALCIUM 8.1* 8.2*  7.8* 8.2* 8.0* 7.8*  PHOS 10.4* 6.3* 6.8* 2.6 3.1   GFR: Estimated Creatinine Clearance: 24.3 mL/min (A) (by C-G formula based on SCr of 3.02 mg/dL (H)). Liver Function Tests: Recent Labs  Lab 05/30/18 0657 05/31/18 0609 06/01/18 0335 06/01/18 1226 06/02/18 0029  AST  --   --   --   --  20  ALT  --   --   --   --  9  ALKPHOS  --   --   --   --  49  BILITOT  --   --   --   --  0.4  PROT  --   --   --   --  6.5  ALBUMIN 2.3* 2.4* 2.4* 2.6* 2.3*  2.3*   No  results for input(s): LIPASE, AMYLASE in the last 168 hours. No results for input(s): AMMONIA in the last 168 hours. Coagulation Profile: Recent Labs  Lab 05/30/18 1015 05/31/18 0609  INR 1.7* 1.1   Cardiac Enzymes: No results for input(s): CKTOTAL, CKMB, CKMBINDEX, TROPONINI  in the last 168 hours. BNP (last 3 results) No results for input(s): PROBNP in the last 8760 hours. HbA1C: No results for input(s): HGBA1C in the last 72 hours. CBG: Recent Labs  Lab 06/01/18 1146 06/01/18 1711 06/01/18 2105 06/02/18 0654 06/02/18 1130  GLUCAP 108* 355* 177* 133* 167*   Lipid Profile: Recent Labs    06/02/18 0029  CHOL 141  HDL 32*  LDLCALC 50  TRIG 295*  CHOLHDL 4.4   Thyroid Function Tests: Recent Labs    06/01/18 1406  TSH 3.292  FREET4 1.35   Anemia Panel: Recent Labs    05/31/18 0609  FERRITIN 266  TIBC 258  IRON 35   Urine analysis:    Component Value Date/Time   COLORURINE AMBER (A) 05/22/2018 0758   APPEARANCEUR CLOUDY (A) 05/22/2018 0758   LABSPEC 1.025 05/22/2018 0758   PHURINE 5.0 05/22/2018 0758   GLUCOSEU 50 (A) 05/22/2018 0758   HGBUR LARGE (A) 05/22/2018 0758   BILIRUBINUR NEGATIVE 05/22/2018 0758   KETONESUR 5 (A) 05/22/2018 0758   PROTEINUR 100 (A) 05/22/2018 0758   UROBILINOGEN 0.2 02/01/2013 1145   NITRITE NEGATIVE 05/22/2018 0758   LEUKOCYTESUR SMALL (A) 05/22/2018 0758   Sepsis Labs: _0 (procalcitonin:4,lacticidven:4)  ) Recent Results (from the past 240 hour(s))  Culture, blood (routine x 2)     Status: None   Collection Time: 05/24/18  1:04 PM  Result Value Ref Range Status   Specimen Description BLOOD RIGHT ANTECUBITAL  Final   Special Requests AEROBIC BOTTLE ONLY Blood Culture adequate volume  Final   Culture   Final    NO GROWTH 5 DAYS Performed at [facility], [place] [address], [place], [place] [address]    Report Status 05/29/2018 FINAL  Final  Culture, blood (routine x 2)     Status: None   Collection Time:  05/24/18  1:08 PM  Result Value Ref Range Status   Specimen Description BLOOD BLOOD RIGHT HAND  Final   Special Requests AEROBIC BOTTLE ONLY Blood Culture adequate volume  Final   Culture   Final    NO GROWTH 5 DAYS Performed at [facility], [place] [address] [place], [place] [address]    Report Status 05/29/2018 FINAL  Final  Culture, bal-quantitative     Status: Abnormal   Collection Time: 05/26/18 12:17 PM  Result Value Ref Range Status   Specimen Description BRONCHIAL ALVEOLAR LAVAGE  Final   Special Requests LLL  Final   Gram Stain   Final    ABUNDANT WBC PRESENT, PREDOMINANTLY PMN NO ORGANISMS SEEN    Culture (A)  Final    30,000 COLONIES/mL Consistent with normal respiratory flora. Performed at [facility], [place] [address] [place], [place] [address]    Report Status 05/28/2018 FINAL  Final      Radiology Studies: Ir Fluoro Guide Cv Line Right  Result Date: 05/31/2018 INDICATION: ACUTE RENAL FAILURE, NO CURRENT PERMANENT ACCESS EXAM: ULTRASOUND GUIDANCE FOR VASCULAR ACCESS RIGHT INTERNAL JUGULAR PERMANENT HEMODIALYSIS CATHETER Date:  05/31/2018 05/31/2018 2:24 pm Radiologist:  Jerilynn Mages. Daryll Brod, MD Guidance:  ULTRASOUND AND FLUOROSCOPIC FLUOROSCOPY TIME:  Fluoroscopy Time: 0 minutes 18 seconds (5 mGy). MEDICATIONS: ANCEF 2 G ADMINISTERED WITHIN 1 HOUR OF THE PROCEDURE ANESTHESIA/SEDATION: Versed 1.0 mg IV; Fentanyl 25 mcg IV; Moderate Sedation Time:  21 minutes The patient was continuously monitored during the procedure by the interventional radiology nurse under my direct supervision. CONTRAST:  None. COMPLICATIONS: None immediate. PROCEDURE: Informed consent was  obtained from the patient following explanation of the procedure, risks, benefits and alternatives. The patient understands, agrees and consents for the procedure. All questions were addressed. A time out was performed. Maximal barrier sterile technique utilized including caps, mask, sterile gowns, sterile gloves,  large sterile drape, hand hygiene, and 2% chlorhexidine scrub. Under sterile conditions and local anesthesia, right internal jugular micropuncture venous access was performed with ultrasound. Images were obtained for documentation of the patent right internal jugular vein. A guide wire was inserted followed by a transitional dilator. Next, a 0.035 guidewire was advanced into the IVC with a 5-French catheter. Measurements were obtained from the right venotomy site to the proximal right atrium. In the right infraclavicular chest, a subcutaneous tunnel was created under sterile conditions and local anesthesia. 1% lidocaine with epinephrine was utilized for this. The 19 cm tip to cuff dialysis catheter was tunneled subcutaneously to the venotomy site and inserted into the SVC/RA junction through a valved peel-away sheath. Position was confirmed with fluoroscopy. Images were obtained for documentation. Blood was aspirated from the catheter followed by saline and heparin flushes. The appropriate volume and strength of heparin was instilled in each lumen. Caps were applied. The catheter was secured at the tunnel site with Gelfoam and a pursestring suture. The venotomy site was closed with subcuticular Vicryl suture. Dermabond was applied to the small right neck incision. A dry sterile dressing was applied. The catheter is ready for use. No immediate complications. IMPRESSION: Ultrasound and fluoroscopically guided right internal jugular tunneled hemodialysis catheter (19 cm tip to cuff dialysis catheter). Electronically Signed   By: Jerilynn Mages.  Shick M.D.   On: 05/31/2018 14:32   Ir US Guide Vasc Access Right  Result Date: 05/31/2018 INDICATION: ACUTE RENAL FAILURE, NO CURRENT PERMANENT ACCESS EXAM: ULTRASOUND GUIDANCE FOR VASCULAR ACCESS RIGHT INTERNAL JUGULAR PERMANENT HEMODIALYSIS CATHETER Date:  05/31/2018 05/31/2018 2:24 pm Radiologist:  M. Daryll Brod, MD Guidance:  ULTRASOUND AND FLUOROSCOPIC FLUOROSCOPY TIME:  Fluoroscopy  Time: 0 minutes 18 seconds (5 mGy). MEDICATIONS: ANCEF 2 G ADMINISTERED WITHIN 1 HOUR OF THE PROCEDURE ANESTHESIA/SEDATION: Versed 1.0 mg IV; Fentanyl 25 mcg IV; Moderate Sedation Time:  21 minutes The patient was continuously monitored during the procedure by the interventional radiology nurse under my direct supervision. CONTRAST:  None. COMPLICATIONS: None immediate. PROCEDURE: Informed consent was obtained from the patient following explanation of the procedure, risks, benefits and alternatives. The patient understands, agrees and consents for the procedure. All questions were addressed. A time out was performed. Maximal barrier sterile technique utilized including caps, mask, sterile gowns, sterile gloves, large sterile drape, hand hygiene, and 2% chlorhexidine scrub. Under sterile conditions and local anesthesia, right internal jugular micropuncture venous access was performed with ultrasound. Images were obtained for documentation of the patent right internal jugular vein. A guide wire was inserted followed by a transitional dilator. Next, a 0.035 guidewire was advanced into the IVC with a 5-French catheter. Measurements were obtained from the right venotomy site to the proximal right atrium. In the right infraclavicular chest, a subcutaneous tunnel was created under sterile conditions and local anesthesia. 1% lidocaine with epinephrine was utilized for this. The 19 cm tip to cuff dialysis catheter was tunneled subcutaneously to the venotomy site and inserted into the SVC/RA junction through a valved peel-away sheath. Position was confirmed with fluoroscopy. Images were obtained for documentation. Blood was aspirated from the catheter followed by saline and heparin flushes. The appropriate volume and strength of heparin was instilled in each lumen. Caps were applied.  The catheter was secured at the tunnel site with Gelfoam and a pursestring suture. The venotomy site was closed with subcuticular Vicryl suture.  Dermabond was applied to the small right neck incision. A dry sterile dressing was applied. The catheter is ready for use. No immediate complications. IMPRESSION: Ultrasound and fluoroscopically guided right internal jugular tunneled hemodialysis catheter (19 cm tip to cuff dialysis catheter). Electronically Signed   By: Jerilynn Mages.  Shick M.D.   On: 05/31/2018 14:32     Scheduled Meds: . arformoterol  15 mcg Nebulization BID  . budesonide (PULMICORT) nebulizer solution  0.5 mg Nebulization BID  . Chlorhexidine Gluconate Cloth  6 each Topical Q0600  . darbepoetin (ARANESP) injection - DIALYSIS  60 mcg Intravenous Q Wed-HD  . diltiazem  240 mg Oral Daily  . feeding supplement (NEPRO CARB STEADY)  237 mL Oral BID BM  . heparin  2,000 Units Intravenous Once  . hydrALAZINE  25 mg Oral Q8H  . insulin aspart  0-5 Units Subcutaneous QHS  . insulin aspart  0-9 Units Subcutaneous TID WC  . ipratropium-albuterol  3 mL Nebulization TID  . isosorbide dinitrate  10 mg Oral TID  . metoprolol succinate  100 mg Oral Daily  . multivitamin  1 tablet Oral QHS   Continuous Infusions: . sodium chloride 10 mL/hr at 05/30/18 0830  . [START ON 06/03/2018] ferric gluconate (FERRLECIT/NULECIT) IV    . heparin 1,450 Units/hr (06/02/18 1200)     LOS: 12 days   Time Spent in minutes   30 minutes  Cornelius Marullo D.O. on 06/02/2018 at 1:28 PM  Between 7am to 7pm - Please see pager noted on [email or web address]  After 7pm go to [email or web address]  And look for the night coverage person covering for me after hours  [facility] Office  [phone]

## 2018-06-02 NOTE — Progress Notes (Addendum)
Progress Note  Patient Name: [name] Date of Encounter: 06/02/2018  Primary Cardiologist: Jenkins Rouge, MD   Subjective   No complaints this morning. Sitting up in the chair resting.   Inpatient Medications    Scheduled Meds: . arformoterol  15 mcg Nebulization BID  . budesonide (PULMICORT) nebulizer solution  0.5 mg Nebulization BID  . Chlorhexidine Gluconate Cloth  6 each Topical Q0600  . darbepoetin (ARANESP) injection - DIALYSIS  60 mcg Intravenous Q Wed-HD  . diltiazem  240 mg Oral Daily  . feeding supplement (NEPRO CARB STEADY)  237 mL Oral BID BM  . ferric citrate  420 mg Oral TID WC  . hydrALAZINE  25 mg Oral Q8H  . insulin aspart  0-5 Units Subcutaneous QHS  . insulin aspart  0-9 Units Subcutaneous TID WC  . ipratropium-albuterol  3 mL Nebulization TID  . isosorbide dinitrate  10 mg Oral TID  . metoprolol succinate  100 mg Oral Daily  . multivitamin  1 tablet Oral QHS   Continuous Infusions: . sodium chloride 10 mL/hr at 05/30/18 0830  . [START ON 06/03/2018] ferric gluconate (FERRLECIT/NULECIT) IV    . heparin 1,450 Units/hr (06/02/18 0731)   PRN Meds: sodium chloride, diphenhydrAMINE, heparin, heparin, hydrALAZINE   Vital Signs    Vitals:   06/02/18 0814 06/02/18 0815 06/02/18 0816 06/02/18 0822  BP:    (!) 185/84  Pulse:   71 72  Resp:   18   Temp:      TempSrc:      SpO2: 95% 95% 95%   Weight:      Height:        Intake/Output Summary (Last 24 hours) at 06/02/2018 1020 Last data filed at 06/02/2018 0830 Gross per 24 hour  Intake 1271.68 ml  Output 3075 ml  Net -1803.32 ml   Last 3 Weights 06/02/2018 06/01/2018 06/01/2018  Weight (lbs) 258 lb 11.2 oz 257 lb 8 oz 263 lb 6.1 oz  Weight (kg) 117.346 kg 116.8 kg 119.47 kg      Telemetry    SR  - Personally Reviewed  ECG    N/a - Personally Reviewed  Physical Exam   GEN: No acute distress.   Neck: No JVD Cardiac: RRR, no murmurs, rubs, or gallops.  Respiratory: Mild crackles  bilaterally GI: Soft, nontender, non-distended  MS: No edema; No deformity. Neuro:  Nonfocal  Psych: Normal affect   Labs    Chemistry Recent Labs  Lab 06/01/18 0335 06/01/18 1226 06/02/18 0029  NA 134* 136 133*  K 3.7 3.6 3.5  CL 96* 98 96*  CO2 24 27 27   GLUCOSE 137* 117* 118*  BUN 55* 16 27*  CREATININE 4.44* 2.00* 3.02*  CALCIUM 8.2* 8.0* 7.8*  PROT  --   --  6.5  ALBUMIN 2.4* 2.6* 2.3*  2.3*  AST  --   --  20  ALT  --   --  9  ALKPHOS  --   --  49  BILITOT  --   --  0.4  GFRNONAA 10* 26* 16*  GFRAA 12* 30* 18*  ANIONGAP 14 11 10      Hematology Recent Labs  Lab 06/01/18 0335 06/01/18 1226 06/02/18 0029  WBC 7.6 6.8 7.4  RBC 3.02* 3.29* 2.77*  HGB 8.3* 9.3* 7.9*  HCT 27.6* 29.9* 25.6*  MCV 91.4 90.9 92.4  MCH 27.5 28.3 28.5  MCHC 30.1 31.1 30.9  RDW 13.7 13.7 13.7  PLT 281 303 259  Cardiac EnzymesNo results for input(s): TROPONINI in the last 168 hours. No results for input(s): TROPIPOC in the last 168 hours.   BNPNo results for input(s): BNP, PROBNP in the last 168 hours.   DDimer No results for input(s): DDIMER in the last 168 hours.   Radiology    Ir Fluoro Guide Cv Line Right  Result Date: 05/31/2018 INDICATION: ACUTE RENAL FAILURE, NO CURRENT PERMANENT ACCESS EXAM: ULTRASOUND GUIDANCE FOR VASCULAR ACCESS RIGHT INTERNAL JUGULAR PERMANENT HEMODIALYSIS CATHETER Date:  05/31/2018 05/31/2018 2:24 pm Radiologist:  M. Daryll Brod, MD Guidance:  ULTRASOUND AND FLUOROSCOPIC FLUOROSCOPY TIME:  Fluoroscopy Time: 0 minutes 18 seconds (5 mGy). MEDICATIONS: ANCEF 2 G ADMINISTERED WITHIN 1 HOUR OF THE PROCEDURE ANESTHESIA/SEDATION: Versed 1.0 mg IV; Fentanyl 25 mcg IV; Moderate Sedation Time:  21 minutes The patient was continuously monitored during the procedure by the interventional radiology nurse under my direct supervision. CONTRAST:  None. COMPLICATIONS: None immediate. PROCEDURE: Informed consent was obtained from the patient following explanation of the  procedure, risks, benefits and alternatives. The patient understands, agrees and consents for the procedure. All questions were addressed. A time out was performed. Maximal barrier sterile technique utilized including caps, mask, sterile gowns, sterile gloves, large sterile drape, hand hygiene, and 2% chlorhexidine scrub. Under sterile conditions and local anesthesia, right internal jugular micropuncture venous access was performed with ultrasound. Images were obtained for documentation of the patent right internal jugular vein. A guide wire was inserted followed by a transitional dilator. Next, a 0.035 guidewire was advanced into the IVC with a 5-French catheter. Measurements were obtained from the right venotomy site to the proximal right atrium. In the right infraclavicular chest, a subcutaneous tunnel was created under sterile conditions and local anesthesia. 1% lidocaine with epinephrine was utilized for this. The 19 cm tip to cuff dialysis catheter was tunneled subcutaneously to the venotomy site and inserted into the SVC/RA junction through a valved peel-away sheath. Position was confirmed with fluoroscopy. Images were obtained for documentation. Blood was aspirated from the catheter followed by saline and heparin flushes. The appropriate volume and strength of heparin was instilled in each lumen. Caps were applied. The catheter was secured at the tunnel site with Gelfoam and a pursestring suture. The venotomy site was closed with subcuticular Vicryl suture. Dermabond was applied to the small right neck incision. A dry sterile dressing was applied. The catheter is ready for use. No immediate complications. IMPRESSION: Ultrasound and fluoroscopically guided right internal jugular tunneled hemodialysis catheter (19 cm tip to cuff dialysis catheter). Electronically Signed   By: Jerilynn Mages.  Shick M.D.   On: 05/31/2018 14:32   Ir US Guide Vasc Access Right  Result Date: 05/31/2018 INDICATION: ACUTE RENAL FAILURE, NO  CURRENT PERMANENT ACCESS EXAM: ULTRASOUND GUIDANCE FOR VASCULAR ACCESS RIGHT INTERNAL JUGULAR PERMANENT HEMODIALYSIS CATHETER Date:  05/31/2018 05/31/2018 2:24 pm Radiologist:  M. Daryll Brod, MD Guidance:  ULTRASOUND AND FLUOROSCOPIC FLUOROSCOPY TIME:  Fluoroscopy Time: 0 minutes 18 seconds (5 mGy). MEDICATIONS: ANCEF 2 G ADMINISTERED WITHIN 1 HOUR OF THE PROCEDURE ANESTHESIA/SEDATION: Versed 1.0 mg IV; Fentanyl 25 mcg IV; Moderate Sedation Time:  21 minutes The patient was continuously monitored during the procedure by the interventional radiology nurse under my direct supervision. CONTRAST:  None. COMPLICATIONS: None immediate. PROCEDURE: Informed consent was obtained from the patient following explanation of the procedure, risks, benefits and alternatives. The patient understands, agrees and consents for the procedure. All questions were addressed. A time out was performed. Maximal barrier sterile technique utilized including caps, mask,  sterile gowns, sterile gloves, large sterile drape, hand hygiene, and 2% chlorhexidine scrub. Under sterile conditions and local anesthesia, right internal jugular micropuncture venous access was performed with ultrasound. Images were obtained for documentation of the patent right internal jugular vein. A guide wire was inserted followed by a transitional dilator. Next, a 0.035 guidewire was advanced into the IVC with a 5-French catheter. Measurements were obtained from the right venotomy site to the proximal right atrium. In the right infraclavicular chest, a subcutaneous tunnel was created under sterile conditions and local anesthesia. 1% lidocaine with epinephrine was utilized for this. The 19 cm tip to cuff dialysis catheter was tunneled subcutaneously to the venotomy site and inserted into the SVC/RA junction through a valved peel-away sheath. Position was confirmed with fluoroscopy. Images were obtained for documentation. Blood was aspirated from the catheter followed by  saline and heparin flushes. The appropriate volume and strength of heparin was instilled in each lumen. Caps were applied. The catheter was secured at the tunnel site with Gelfoam and a pursestring suture. The venotomy site was closed with subcuticular Vicryl suture. Dermabond was applied to the small right neck incision. A dry sterile dressing was applied. The catheter is ready for use. No immediate complications. IMPRESSION: Ultrasound and fluoroscopically guided right internal jugular tunneled hemodialysis catheter (19 cm tip to cuff dialysis catheter). Electronically Signed   By: Jerilynn Mages.  Shick M.D.   On: 05/31/2018 14:32    Cardiac Studies     Patient Profile     63 y.o. female with a hx of cognitive delay previously in ALF, HTN, DM, CKD III, reported chronic diastolic CHF on Lasix PTA, ?Cushing's disease, right adrenal adenoma, asthma, arthritis, iron deficiency anemia, morbid obesity, OSA who was seen for the evaluation of VT and PAF at the request of Dr. Ree Kida.  Assessment & Plan    1. Acute respiratory failure with hypoxia/hypercarbia and post-obstructive PNA - multifactorial respiratory failure, extubated to [place] O2. Wean O2 as able.  2. Wide complex tachycardia on admission 05/21/18 (with subsequent junctional rhythm) - difficult to know whether this represented VT or other arrhythmia with wide complex due to hyperkalemia. Regardless, has not had recurrence since correction of lytes. LVEF is normal.  --Continue beta blocker.   3. Paroxysmal atrial fib (newly recognized) - likely precipitated by numerous medical stressors which are likely to continue to be a challenge for a while - renal failure, OSA, carcinoid. CHADSVASC is 5 for h/o LV dysfunction, HTN, DM, vascular disease (plaque on TEE), female which would warrant long term anticoagulation. She is s/p [facility] by IR 3/3, will likely need AVF placement and also consideration of surgery by oncology. Hgb back down to 7.9 today. - continues in  [place]. Remain on Heparin until all invasive procedures completed. Plan for possible low dose Eliquis, but need to maintain a stable Hgb.  3. Carcinoid of lung - per IM notes they reviewed with oncology who will review but they suspect a slow growing tumor with good prognosis.  4. Aortic valve abnormality -  not felt to represent infective endocarditis. Interestingly there is an association between carcinoid and valvular lesions. Follow clinically for now.  5. Acute on chronic diastolic CHF - managed by HD. Had HD yesterday.   6. Mild CAD in 2013 - update lipid profile in AM along with f/u LFTs. Consider initiation of statin based on results since pt is diabetic as well.  7. HTN: blood pressures elevated this morning. Will add hydralazine 25mg  TID.  --  continue on CCB, and BB at current doses  For questions or updates, please contact [place] Please consult [email or web address] for contact info under   Signed, Reino Bellis, NP  06/02/2018, 10:20 AM     ATTENDING ATTESTATION  I have seen, examined and evaluated the patient this AM along with Reino Bellis, NP-C.  After reviewing all the available data and chart, we discussed the patients laboratory, study & physical findings as well as symptoms in detail. I agree with her findings, examination as well as impression recommendations as per our discussion.    A. fib rate is stable.  Currently using heparin for anticoagulation with plans to switch to DOAC (Eliquis) once procedures completed. Blood pressure is high, may need more adjustment.  Would defer hypertension management to nephrology as we have maxed out beta-blocker and calcium channel dosing.  We will follow along from a distance, but without further recommendations, we will not round on a daily basis. We will be happy to see if there is any recurrence of arrhythmia. Please notify our team when she is nearing discharge for assistance with discharge regimen.      Glenetta Hew,  M.D., M.S. Interventional Cardiologist   Pager # [phone] Phone # [phone] [place], [place] [address]

## 2018-06-02 NOTE — Progress Notes (Signed)
Oncology Nurse Navigator Documentation  Oncology Nurse Navigator Flowsheets 06/02/2018  Navigator Location [facility]  Navigator Encounter Type Other/I updated Dr. Valeta Harms on cancer conference discussion for Ms. Fitzmaurice 06/02/2018.  Recommendation is for patient to get dotatate PET scan then be evaluated for possible resection.   Abnormal Finding Date 05/13/2018  Confirmed Diagnosis Date 05/26/2018  Patient Visit Type Inpatient  Treatment Phase Pre-Tx/Tx Discussion  Barriers/Navigation Needs Coordination of Care  Interventions Coordination of Care  Coordination of Care Other  Acuity Level 1  Time Spent with Patient 15

## 2018-06-03 ENCOUNTER — Encounter (HOSPITAL_COMMUNITY)
Admission: EM | Disposition: A | Discharge status: Skilled Nursing Facility | Payer: Self-pay | Source: Home / Self Care | Attending: Internal Medicine

## 2018-06-03 LAB — GLUCOSE, CAPILLARY
Glucose-Capillary: 116 mg/dL — ABNORMAL HIGH (ref 70–99)
Glucose-Capillary: 160 mg/dL — ABNORMAL HIGH (ref 70–99)
Glucose-Capillary: 204 mg/dL — ABNORMAL HIGH (ref 70–99)

## 2018-06-03 LAB — RENAL FUNCTION PANEL
Albumin: 2.6 g/dL — ABNORMAL LOW (ref 3.5–5.0)
Anion gap: 12 (ref 5–15)
BUN: 49 mg/dL — ABNORMAL HIGH (ref 8–23)
CO2: 23 mmol/L (ref 22–32)
Calcium: 8.5 mg/dL — ABNORMAL LOW (ref 8.9–10.3)
Chloride: 95 mmol/L — ABNORMAL LOW (ref 98–111)
Creatinine, Ser: 4.17 mg/dL — ABNORMAL HIGH (ref 0.44–1.00)
GFR calc Af Amer: 12 mL/min — ABNORMAL LOW (ref >60)
GFR calc non Af Amer: 11 mL/min — ABNORMAL LOW (ref >60)
Glucose, Bld: 187 mg/dL — ABNORMAL HIGH (ref 70–99)
Phosphorus: 3.1 mg/dL (ref 2.5–4.6)
Potassium: 3.3 mmol/L — ABNORMAL LOW (ref 3.5–5.1)
Sodium: 130 mmol/L — ABNORMAL LOW (ref 135–145)

## 2018-06-03 LAB — CBC
HCT: 25 % — ABNORMAL LOW (ref 36.0–46.0)
Hemoglobin: 7.8 g/dL — ABNORMAL LOW (ref 12.0–15.0)
MCH: 28.9 pg (ref 26.0–34.0)
MCHC: 31.2 g/dL (ref 30.0–36.0)
MCV: 92.6 fL (ref 80.0–100.0)
Platelets: 288 10*3/uL (ref 150–400)
RBC: 2.7 MIL/uL — ABNORMAL LOW (ref 3.87–5.11)
RDW: 14 % (ref 11.5–15.5)
WBC: 8.9 10*3/uL (ref 4.0–10.5)
nRBC: 0 % (ref 0.0–0.2)

## 2018-06-03 LAB — HEPARIN LEVEL (UNFRACTIONATED): Heparin Unfractionated: 0.3 IU/mL (ref 0.30–0.70)

## 2018-06-03 SURGERY — ARTERIOVENOUS (AV) FISTULA CREATION
Anesthesia: Monitor Anesthesia Care | Laterality: Left

## 2018-06-03 MED ORDER — HEPARIN SODIUM (PORCINE) 1000 UNIT/ML IJ SOLN
INTRAMUSCULAR | Status: AC
  Administered 2018-06-03: 1000 [IU]
  Filled 2018-06-03: qty 4

## 2018-06-03 MED ORDER — ALTEPLASE 2 MG IJ SOLR: 2.0000 mg | Freq: Once | INTRAMUSCULAR | Status: DC | PRN

## 2018-06-03 MED ORDER — HEPARIN SODIUM (PORCINE) 1000 UNIT/ML DIALYSIS
1000.0000 [IU] | INTRAMUSCULAR | Status: DC | PRN
  Administered 2018-06-06: 3000 [IU] via INTRAVENOUS_CENTRAL
  Administered 2018-06-09: 1000 [IU] via INTRAVENOUS_CENTRAL
  Filled 2018-06-03 (×3): qty 1

## 2018-06-03 MED ORDER — SODIUM CHLORIDE 0.9 % IV SOLN: 100.0000 mL | INTRAVENOUS | Status: DC | PRN

## 2018-06-03 MED ORDER — HEPARIN SODIUM (PORCINE) 1000 UNIT/ML DIALYSIS
1000.0000 [IU] | INTRAMUSCULAR | Status: DC | PRN
  Filled 2018-06-03: qty 1

## 2018-06-03 MED ORDER — POTASSIUM CHLORIDE CRYS ER 20 MEQ PO TBCR
40.0000 meq | EXTENDED_RELEASE_TABLET | Freq: Once | ORAL | Status: AC
  Administered 2018-06-03: 40 meq via ORAL
  Filled 2018-06-03: qty 2

## 2018-06-03 NOTE — Social Work (Signed)
CSW called patient's legal guardian, [name], to discuss SNF bed offers. Awaiting call back.  Estanislado Emms, LCSW [phone]

## 2018-06-03 NOTE — Progress Notes (Signed)
PT Cancellation Note  Patient Details Name: [name] MRN: [id number] DOB: [birth year]   Cancelled Treatment:    Reason Eval/Treat Not Completed: Fatigue/lethargy limiting ability to participate Attempted to see pt for PT. Pt is lethargic and unable to keep eyes open to speak to this therapist. RN reports pt just returned from HD. PT will continue to follow acutely.    Salina April, PTA Acute Rehabilitation Services Pager: [phone] Office: [phone]   06/03/2018, 2:38 PM

## 2018-06-03 NOTE — Progress Notes (Signed)
[facility] NEPHROLOGY PROGRESS NOTE  Assessment/ Plan: Pt is a 63 y.o. yo female with hypertension, diabetes, CKD 3 admitted with pneumonia, sepsis and possible aortic fall marantic lesion, AKI requiring CVVH in the beginning now on intermittent hemo-.  #AKI on CKD 3 in the setting of sepsis, concomitant ARB and possible cardiorenal syndrome.  Patient received CRRT from 2/22 and transition to intermittent hemo-on 2/24.  Patient remains oliguric and dialysis dependent.  -Status post TDC by IR on 3/3 -Vein mapping done, VVS consulted for AVF placement -Dialysis today, tolerating well.  No plan for dialysis through the weekend and watch for renal recovery. -CLIP  #Aortic valve vegetation concern for culture-negative versus marantic AV lesion: TEE was done.  #Hyperkalemia: Improved  #Acute on chronic diastolic CHF: Now managing volume with dialysis.  #Anemia of CKD: Iron saturation 14%.  Continue IV iron and Aranesp.  Monitor CBC.    #Hypertension:   Monitor blood pressure.  Continue current cardiac medication.  #Hyperphosphatemia: PTH level 143.  Phosphorus level is low therefore discontinue Auryxia.  Subjective: Seen and examined.  At dialysis.  Doing well.  No chest pain, shortness of breath, nausea vomiting.  Objective Vital signs in last 24 hours: Vitals:   06/03/18 0830 06/03/18 0900 06/03/18 0930 06/03/18 1000  BP: (!) 151/76 (!) 144/76 (!) 155/76 (!) 167/72  Pulse: 71 71 74 72  Resp:      Temp:      TempSrc:      SpO2:      Weight:      Height:       Weight change: 2.677 kg  Intake/Output Summary (Last 24 hours) at 06/03/2018 1024 Last data filed at 06/03/2018 6945 Gross per 24 hour  Intake 993.95 ml  Output 100 ml  Net 893.95 ml       Labs: Basic Metabolic Panel: Recent Labs  Lab 06/01/18 1226 06/02/18 0029 06/03/18 0359  NA 136 133* 130*  K 3.6 3.5 3.3*  CL 98 96* 95*  CO2 27 27 23   GLUCOSE 117* 118* 187*  BUN 16 27* 49*  CREATININE  2.00* 3.02* 4.17*  CALCIUM 8.0* 7.8* 8.5*  PHOS 2.6 3.1 3.1   Liver Function Tests: Recent Labs  Lab 06/01/18 1226 06/02/18 0029 06/03/18 0359  AST  --  20  --   ALT  --  9  --   ALKPHOS  --  49  --   BILITOT  --  0.4  --   PROT  --  6.5  --   ALBUMIN 2.6* 2.3*  2.3* 2.6*   No results for input(s): LIPASE, AMYLASE in the last 168 hours. No results for input(s): AMMONIA in the last 168 hours. CBC: Recent Labs  Lab 05/31/18 0609 06/01/18 0335 06/01/18 1226 06/02/18 0029 06/03/18 0359  WBC 6.2 7.6 6.8 7.4 8.9  HGB 7.8* 8.3* 9.3* 7.9* 7.8*  HCT 26.1* 27.6* 29.9* 25.6* 25.0*  MCV 93.9 91.4 90.9 92.4 92.6  PLT 242 281 303 259 288   Cardiac Enzymes: No results for input(s): CKTOTAL, CKMB, CKMBINDEX, TROPONINI in the last 168 hours. CBG: Recent Labs  Lab 06/01/18 2105 06/02/18 0654 06/02/18 1130 06/02/18 1525 06/02/18 2218  GLUCAP 177* 133* 167* 148* 211*    Iron Studies:  No results for input(s): IRON, TIBC, TRANSFERRIN, FERRITIN in the last 72 hours. Studies/Results: Vas [place] Upper Ext Vein Mapping (pre-op Avf)  Result Date: 06/02/2018 UPPER EXTREMITY VEIN MAPPING  Indications: Pre-op dialysis access. Comparison Study: No prior study  Performing Technologist: Sharion Dove RVS  Examination Guidelines: A complete evaluation includes B-mode imaging, spectral Doppler, color Doppler, and power Doppler as needed of all accessible portions of each vessel. Bilateral testing is considered an integral part of a complete examination. Limited examinations for reoccurring indications may be performed as noted. +-----------------+-------------+----------+---------+ Right Cephalic   Diameter (cm)Depth (cm)Findings  +-----------------+-------------+----------+---------+ Prox upper arm       0.49        1.57             +-----------------+-------------+----------+---------+ Mid upper arm        0.43        1.34              +-----------------+-------------+----------+---------+ Dist upper arm       0.42        0.98   branching +-----------------+-------------+----------+---------+ Antecubital fossa    0.54        0.32             +-----------------+-------------+----------+---------+ Prox forearm         0.41        1.19   branching +-----------------+-------------+----------+---------+ Mid forearm          0.31        0.55             +-----------------+-------------+----------+---------+ Wrist                0.28        0.72   branching +-----------------+-------------+----------+---------+ +--------------+-------------+----------+---------+ Right Basilic Diameter (cm)Depth (cm)Findings  +--------------+-------------+----------+---------+ Dist upper arm                       branching +--------------+-------------+----------+---------+ Prox forearm                         branching +--------------+-------------+----------+---------+ Mid forearm                          branching +--------------+-------------+----------+---------+ +-----------------+-------------+----------+---------+ Left Cephalic    Diameter (cm)Depth (cm)Findings  +-----------------+-------------+----------+---------+ Prox upper arm       0.52        1.29             +-----------------+-------------+----------+---------+ Mid upper arm        0.53        1.87             +-----------------+-------------+----------+---------+ Dist upper arm       0.59        1.36   branching +-----------------+-------------+----------+---------+ Antecubital fossa    0.72        0.96             +-----------------+-------------+----------+---------+ Prox forearm         0.39        1.24             +-----------------+-------------+----------+---------+ Mid forearm          0.46        1.54   branching +-----------------+-------------+----------+---------+ Wrist                0.40        0.81              +-----------------+-------------+----------+---------+ *See table(s) above for measurements and observations.  Diagnosing physician: Servando Snare MD Electronically signed by Servando Snare MD on 06/02/2018 at 5:52:22 PM.    Final  Medications: Infusions: . sodium chloride 10 mL/hr at 05/30/18 0830  . sodium chloride    . sodium chloride    . ferric gluconate (FERRLECIT/NULECIT) IV    . heparin 2,000 Units/hr (06/03/18 0009)    Scheduled Medications: . arformoterol  15 mcg Nebulization BID  . budesonide (PULMICORT) nebulizer solution  0.5 mg Nebulization BID  . Chlorhexidine Gluconate Cloth  6 each Topical Q0600  . darbepoetin (ARANESP) injection - DIALYSIS  60 mcg Intravenous Q Wed-HD  . diltiazem  240 mg Oral Daily  . feeding supplement (NEPRO CARB STEADY)  237 mL Oral BID BM  . hydrALAZINE  25 mg Oral Q8H  . insulin aspart  0-5 Units Subcutaneous QHS  . insulin aspart  0-9 Units Subcutaneous TID WC  . ipratropium-albuterol  3 mL Nebulization TID  . isosorbide dinitrate  10 mg Oral TID  . metoprolol succinate  100 mg Oral Daily  . multivitamin  1 tablet Oral QHS    have reviewed scheduled and prn medications.  Physical Exam: General: Vertebral, lying in bed comfortable Heart:RRR, s1s2 nl, no rubs Lungs: Clear bilateral, no wheezing Abdomen:soft, Non-tender, non-distended Extremities: Lower extremity pitting edema+. Dialysis Access: Tunneled catheter site looks clean  Juliya Magill Prasad Jamya Starry 06/03/2018,10:24 AM  LOS: 13 days

## 2018-06-03 NOTE — Care Management Note (Addendum)
Case Management Note  Patient Details  Name: [name] MRN: [id number] Date of Birth: 1956-01-17  Subjective/Objective:  Pt presented for respiratory distress-transfer from ICU. Acute Renal Failure-initiated on HD- per MD notes monitoring for renal recovery. PTA from [facility].                   Action/Plan: CM did reach out to HD to see if patient needs to be Clipped for outpatient. Awaiting clarification-CM will continue to monitor for additional transition of care needs.    Expected Discharge Date:                  Expected Discharge Plan:  Assisted Living / Rest Home(From [facility])  In-House Referral:  Clinical Social Work  Discharge planning Services  CM Consult  Post Acute Care Choice:    Choice offered to:     DME Arranged:    DME Agency:     HH Arranged:    HH Agency:     Status of Service:  In process, will continue to follow  If discussed at Long Length of Stay Meetings, dates discussed:    Additional Comments:  Bethena Roys, RN 06/03/2018, 11:28 AM

## 2018-06-03 NOTE — Progress Notes (Signed)
ANTICOAGULATION CONSULT NOTE - Follow Up Consult  Pharmacy Consult for heparin Indication: atrial fibrillation  Labs: Recent Labs    06/01/18 1226  06/02/18 0029 06/02/18 1000 06/02/18 2230 06/03/18 0359 06/03/18 0945  HGB 9.3*  --  7.9*  --   --  7.8*  --   HCT 29.9*  --  25.6*  --   --  25.0*  --   PLT 303  --  259  --   --  288  --   HEPARINUNFRC  --    < > <0.10* 0.10* 0.23*  --  0.30  CREATININE 2.00*  --  3.02*  --   --  4.17*  --    < > = values in this interval not displayed.    Assessment: 63yo female remains subtherapeutic on heparin after rate increase though is increasing; no gtt issues or signs of bleeding documented. HL therapeutic at 0.3, CBCs low stable  Goal of Therapy:  Heparin level 0.3-0.7 units/ml   Plan:  Increase heparin slightly to 2100 units/hr Monitor daily heparin level, CBCs and s/sx of bleeding  Gwenlyn Found, Sherian Rein D PGY1 Pharmacy Resident  Phone [phone] 06/03/2018   10:25 AM

## 2018-06-03 NOTE — Progress Notes (Signed)
ANTICOAGULATION CONSULT NOTE - Follow Up Consult  Pharmacy Consult for heparin Indication: atrial fibrillation  Labs: Recent Labs    05/31/18 0609 06/01/18 0335 06/01/18 1226 06/02/18 0029 06/02/18 1000 06/02/18 2230  HGB 7.8* 8.3* 9.3* 7.9*  --   --   HCT 26.1* 27.6* 29.9* 25.6*  --   --   PLT 242 281 303 259  --   --   LABPROT 14.3  --   --   --   --   --   INR 1.1  --   --   --   --   --   HEPARINUNFRC  --   --   --  <0.10* 0.10* 0.23*  CREATININE 3.70* 4.44* 2.00* 3.02*  --   --     Assessment: 62yo female remains subtherapeutic on heparin after rate increase though is increasing; no gtt issues or signs of bleeding per RN.  Goal of Therapy:  Heparin level 0.3-0.7 units/ml   Plan:  Will rebolus with heparin 2000 units and increase heparin gtt by 2 units/kg/hr to 2000 units/hr and check level in 8 hours.    Wynona Neat, PharmD, BCPS  06/03/2018,12:01 AM

## 2018-06-03 NOTE — Progress Notes (Signed)
PROGRESS NOTE    [name]  SLH:[id number] DOB: [birth year] DOA: 05/21/2018 PCP: Rosita Fire, MD   Brief Narrative:  HPI on 05/21/2018 by [name] 63 year old female presents from [place] to [facility] with reported one day of respiratory distress, placed on BiPAP by EMS. On arrival to ED found to be in [place]. Given Amiodarone bolus Cardiology consulted. Preparing for cardioversion, however patient converted into wide complex tachycardia. Intubated, during intubation had notable emesis. Given Vancomycin/Zosyn. K 7.5, Crt 4.31. Administered Bicarb, Calcium, Albuterol, Insulin/Dextrose. ABG 7.15/45. LA 3.2. Transferred to [facility] for further treatment.   Recent admission 2/14-2/17 with CAP. Noted to have AKI which improved. At discharge Cr 1.63. CT Chest concerning for neoplastic process of left lower lung base.   Interim history Patient was found to have severe dyspnea requiring BiPAP by EMS.  On arrival in the ER, she was found to be in V. tach.  Patient was given an amiodarone bolus and then developed unstable wide-complex tachycardia and acute respiratory failure.  Patient was admitted by [facility] and was in the ICU and intubated.  TRH is assumed care. 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 -Oncology consulted and appreciated- will need dotatate PET scan   Acute renal failure on chronic kidney disease, stage III/hyperkalemia/metabolic acidosis/lactic  acidosis -Creatinine baseline 1.3-1.6, now she is dialysis dependent -Nephrology consulted and appreciated, pending further recommendations- transitioning to intermittent hemo -Vascular surgery consulted for [facility] and AVF placement (poss next week if still inpatient or outpatient if discharged)  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 -1 cm mobile AV vegetation -Blood cultures unremarkable x4 -Infectious disease consulted and appreciated, doubt culture-negative endocarditis but rather that this is either marantic or simply a calcified nodule  Acute on chronic diastolic CHF/hypertension -Patient appears to be volume overloaded, currently dialyzing for volume control -She failed Lasix therapy -BP stable -Continue aspirin, Isordil, metoprolol, diltiazem, hydralazine  Ventricular tachycardia/junctional rhythm in the setting of hyperkalemia -Resolved, continue metoprolol  Atrial fibrillation with RVR, paroxysmal -Cardiology consulted and appreciated- recommended Eliquis on discharge -Patient seems to have also developed self-limited A. fib post bronchoscopy however overnight patient had repeat episode of atrial fibrillation with heart rate in the 130s -Patient was given diltiazem  Sleep apnea -Continue BiPAP  Diabetes mellitus, type II -Continue insulin sliding scale with CBG monitoring  History of asthma -Continue budesonide, aformoterol, DuoNebs  Normocytic anemia  -hemoglobin 7.8 today -anemia panel: Iron 35, ferritin 266 -continue to monitor CBC  DVT Prophylaxis  Heparin  Code Status: Full  Family Communication: None at bedside  Disposition Plan: Admitted. Dispo TBD- pending nephrology recommendations  Consultants PCCM Cardiology Nephrology Interventional radiology Vascular surgery  Procedures  Intubation/Extubation Bronchoscopy  TEE  Antibiotics   Anti-infectives (From admission, onward)   Start     Dose/Rate Route Frequency Ordered Stop   05/31/18 0730  ceFAZolin  (ANCEF) IVPB 2g/100 mL premix     2 g 200 mL/hr over 30 Minutes Intravenous To Radiology 05/31/18 0557 05/31/18 1412   05/30/18 0830  ceFAZolin (ANCEF) IVPB 2g/100 mL premix     2 g 200  mL/hr over 30 Minutes Intravenous To Radiology 05/30/18 0824 05/30/18 1614   05/28/18 1000  amoxicillin-clavulanate (AUGMENTIN) 500-125 MG per tablet 500 mg     1 tablet Oral Daily 05/28/18 0949 05/29/18 0935   05/27/18 1400  Ampicillin-Sulbactam (UNASYN) 3 g in sodium chloride 0.9 % 100 mL IVPB  Status:  Discontinued     3 g 200 mL/hr over 30 Minutes Intravenous Every 12 hours 05/27/18 1039 05/28/18 0949   05/27/18 1200  vancomycin (VANCOCIN) IVPB 1000 mg/200 mL premix  Status:  Discontinued     1,000 mg 200 mL/hr over 60 Minutes Intravenous Every M-W-F (Hemodialysis) 05/25/18 1111 05/26/18 1600   05/25/18 1800  vancomycin (VANCOCIN) IVPB 1000 mg/200 mL premix  Status:  Discontinued     1,000 mg 200 mL/hr over 60 Minutes Intravenous Every M-W-F (Hemodialysis) 05/25/18 1106 05/25/18 1111   05/25/18 1500  vancomycin (VANCOCIN) 1,500 mg in sodium chloride 0.9 % 500 mL IVPB     1,500 mg 250 mL/hr over 120 Minutes Intravenous  Once 05/25/18 1111 05/25/18 1843   05/25/18 1400  Ampicillin-Sulbactam (UNASYN) 3 g in sodium chloride 0.9 % 100 mL IVPB  Status:  Discontinued     3 g 200 mL/hr over 30 Minutes Intravenous Every 12 hours 05/25/18 1111 05/27/18 1039   05/25/18 1200  Ampicillin-Sulbactam (UNASYN) 3 g in sodium chloride 0.9 % 100 mL IVPB  Status:  Discontinued     3 g 200 mL/hr over 30 Minutes Intravenous Every 12 hours 05/25/18 1108 05/25/18 1111   05/25/18 1130  vancomycin (VANCOCIN) IVPB 1000 mg/200 mL premix  Status:  Discontinued     1,000 mg 200 mL/hr over 60 Minutes Intravenous  Once 05/25/18 1106 05/25/18 1111   05/24/18 0600  ceFEPIme (MAXIPIME) 1 g in sodium chloride 0.9 % 100 mL IVPB  Status:  Discontinued     1 g 200 mL/hr over 30 Minutes Intravenous Every 24 hours 05/23/18 0557 05/23/18  1156   05/24/18 0600  ampicillin-sulbactam (UNASYN) 1.5 g in sodium chloride 0.9 % 100 mL IVPB  Status:  Discontinued     1.5 g 200 mL/hr over 30 Minutes Intravenous Every 24 hours 05/23/18 1159 05/23/18 1204   05/23/18 2200  ampicillin-sulbactam (UNASYN) 1.5 g in sodium chloride 0.9 % 100 mL IVPB  Status:  Discontinued     1.5 g 200 mL/hr over 30 Minutes Intravenous Every 24 hours 05/23/18 1204 05/25/18 1108   05/23/18 2000  ampicillin-sulbactam (UNASYN) 1.5 g in sodium chloride 0.9 % 100 mL IVPB  Status:  Discontinued     1.5 g 200 mL/hr over 30 Minutes Intravenous Every 24 hours 05/23/18 1156 05/23/18 1159   05/23/18 0600  ceFEPIme (MAXIPIME) 2 g in sodium chloride 0.9 % 100 mL IVPB     2 g 200 mL/hr over 30 Minutes Intravenous  Once 05/23/18 0548 05/23/18 0702   05/23/18 0600  vancomycin (VANCOCIN) 2,000 mg in sodium chloride 0.9 % 500 mL IVPB     2,000 mg 250 mL/hr over 120 Minutes Intravenous  Once 05/23/18 0548 05/23/18 0933   05/23/18 0559  vancomycin variable dose per unstable renal function (pharmacist dosing)  Status:  Discontinued      Does not apply See admin instructions 05/23/18 0559 05/23/18 1156   05/22/18 2200  vancomycin (VANCOCIN) IVPB 1000 mg/200 mL premix  Status:  Discontinued     1,000 mg 200 mL/hr over 60 Minutes Intravenous Every 24 hours 05/21/18 2047 05/21/18 2206   05/21/18 1730  vancomycin (  VANCOCIN) IVPB 1000 mg/200 mL premix  Status:  Discontinued     1,000 mg 200 mL/hr over 60 Minutes Intravenous  Once 05/21/18 1621 05/21/18 2206   05/21/18 1630  vancomycin (VANCOCIN) IVPB 1000 mg/200 mL premix     1,000 mg 200 mL/hr over 60 Minutes Intravenous  Once 05/21/18 1621 05/21/18 2309   05/21/18 1545  ceFEPIme (MAXIPIME) 2 g in sodium chloride 0.9 % 100 mL IVPB     2 g 200 mL/hr over 30 Minutes Intravenous  Once 05/21/18 1543 05/21/18 1745      Subjective:   [name] seen and examined today.  Patient has no complaints this morning.  Denies current  chest pain, shortness breath, abdominal pain, nausea or vomiting, diarrhea or constipation, dizziness or headache.  Objective:   Vitals:   06/03/18 0900 06/03/18 0930 06/03/18 1000 06/03/18 1030  BP: (!) 144/76 (!) 155/76 (!) 167/72 (!) 160/76  Pulse: 71 74 72 75  Resp:      Temp:      TempSrc:      SpO2:      Weight:      Height:        Intake/Output Summary (Last 24 hours) at 06/03/2018 1042 Last data filed at 06/03/2018 1245 Gross per 24 hour  Intake 993.95 ml  Output 100 ml  Net 893.95 ml   Filed Weights   06/02/18 0511 06/03/18 0514 06/03/18 0718  Weight: 117.3 kg 119.5 kg 119.3 kg   Exam  General: Well developed, well nourished, NAD, appears stated age  62: NCAT, mucous membranes moist.   Cardiovascular: S1 S2 auscultated, RRR, no murmur  Respiratory: Clear to auscultation bilaterally with equal chest rise  Abdomen: Soft, nontender, nondistended, + bowel sounds  Extremities: warm dry without cyanosis clubbing or edema  Neuro: AAOx3, nonfocal  Psych: Pleasant, appropriate mood and affect  Data Reviewed: I have personally reviewed following labs and imaging studies  CBC: Recent Labs  Lab 05/31/18 0609 06/01/18 0335 06/01/18 1226 06/02/18 0029 06/03/18 0359  WBC 6.2 7.6 6.8 7.4 8.9  HGB 7.8* 8.3* 9.3* 7.9* 7.8*  HCT 26.1* 27.6* 29.9* 25.6* 25.0*  MCV 93.9 91.4 90.9 92.4 92.6  PLT 242 281 303 259 809   Basic Metabolic Panel: Recent Labs  Lab 05/31/18 0609 06/01/18 0335 06/01/18 1226 06/02/18 0029 06/03/18 0359  NA 133* 134* 136 133* 130*  K 3.6 3.7 3.6 3.5 3.3*  CL 96* 96* 98 96* 95*  CO2 _0 GLUCOSE 124* 137* 117* 118* 187*  BUN 38* 55* 16 27* 49*  CREATININE 3.70* 4.44* 2.00* 3.02* 4.17*  CALCIUM 8.2*  7.8* 8.2* 8.0* 7.8* 8.5*  PHOS 6.3* 6.8* 2.6 3.1 3.1   GFR: Estimated Creatinine Clearance: 17.8 mL/min (A) (by C-G formula based on SCr of 4.17 mg/dL (H)). Liver Function Tests: Recent Labs  Lab 05/31/18 0609  06/01/18 0335 06/01/18 1226 06/02/18 0029 06/03/18 0359  AST  --   --   --  20  --   ALT  --   --   --  9  --   ALKPHOS  --   --   --  49  --   BILITOT  --   --   --  0.4  --   PROT  --   --   --  6.5  --   ALBUMIN 2.4* 2.4* 2.6* 2.3*  2.3* 2.6*   No results for input(s): LIPASE, AMYLASE in the last 168 hours.  No results for input(s): AMMONIA in the last 168 hours. Coagulation Profile: Recent Labs  Lab 05/30/18 1015 05/31/18 0609  INR 1.7* 1.1   Cardiac Enzymes: No results for input(s): CKTOTAL, CKMB, CKMBINDEX, TROPONINI in the last 168 hours. BNP (last 3 results) No results for input(s): PROBNP in the last 8760 hours. HbA1C: No results for input(s): HGBA1C in the last 72 hours. CBG: Recent Labs  Lab 06/01/18 2105 06/02/18 0654 06/02/18 1130 06/02/18 1525 06/02/18 2218  GLUCAP 177* 133* 167* 148* 211*   Lipid Profile: Recent Labs    06/02/18 0029  CHOL 141  HDL 32*  LDLCALC 50  TRIG 295*  CHOLHDL 4.4   Thyroid Function Tests: Recent Labs    06/01/18 1406  TSH 3.292  FREET4 1.35   Anemia Panel: No results for input(s): VITAMINB12, FOLATE, FERRITIN, TIBC, IRON, RETICCTPCT in the last 72 hours. Urine analysis:    Component Value Date/Time   COLORURINE AMBER (A) 05/22/2018 0758   APPEARANCEUR CLOUDY (A) 05/22/2018 0758   LABSPEC 1.025 05/22/2018 0758   PHURINE 5.0 05/22/2018 0758   GLUCOSEU 50 (A) 05/22/2018 0758   HGBUR LARGE (A) 05/22/2018 0758   BILIRUBINUR NEGATIVE 05/22/2018 0758   KETONESUR 5 (A) 05/22/2018 0758   PROTEINUR 100 (A) 05/22/2018 0758   UROBILINOGEN 0.2 02/01/2013 1145   NITRITE NEGATIVE 05/22/2018 0758   LEUKOCYTESUR SMALL (A) 05/22/2018 0758   Sepsis Labs: _0 (procalcitonin:4,lacticidven:4)  ) Recent Results (from the past 240 hour(s))  Culture, blood (routine x 2)     Status: None   Collection Time: 05/24/18  1:04 PM  Result Value Ref Range Status   Specimen Description BLOOD RIGHT ANTECUBITAL  Final   Special  Requests AEROBIC BOTTLE ONLY Blood Culture adequate volume  Final   Culture   Final    NO GROWTH 5 DAYS Performed at [facility], [place] [address], [place], [place] [address]    Report Status 05/29/2018 FINAL  Final  Culture, blood (routine x 2)     Status: None   Collection Time: 05/24/18  1:08 PM  Result Value Ref Range Status   Specimen Description BLOOD BLOOD RIGHT HAND  Final   Special Requests AEROBIC BOTTLE ONLY Blood Culture adequate volume  Final   Culture   Final    NO GROWTH 5 DAYS Performed at [facility], [place] [address], [place], [place] [address]    Report Status 05/29/2018 FINAL  Final  Culture, bal-quantitative     Status: Abnormal   Collection Time: 05/26/18 12:17 PM  Result Value Ref Range Status   Specimen Description BRONCHIAL ALVEOLAR LAVAGE  Final   Special Requests LLL  Final   Gram Stain   Final    ABUNDANT WBC PRESENT, PREDOMINANTLY PMN NO ORGANISMS SEEN    Culture (A)  Final    30,000 COLONIES/mL Consistent with normal respiratory flora. Performed at [facility], [place] [address], [place], [place] [address]    Report Status 05/28/2018 FINAL  Final      Radiology Studies: Vas [place] Upper Ext Vein Mapping (pre-op Avf)  Result Date: 06/02/2018 UPPER EXTREMITY VEIN MAPPING  Indications: Pre-op dialysis access. Comparison Study: No prior study Performing Technologist: Sharion Dove RVS  Examination Guidelines: A complete evaluation includes B-mode imaging, spectral Doppler, color Doppler, and power Doppler as needed of all accessible portions of each vessel. Bilateral testing is considered an integral part of a complete examination. Limited examinations for reoccurring indications may be performed as noted. +-----------------+-------------+----------+---------+  Right Cephalic   Diameter (cm)Depth (cm)Findings  +-----------------+-------------+----------+---------+ Prox upper arm       0.49        1.57              +-----------------+-------------+----------+---------+ Mid upper arm        0.43        1.34             +-----------------+-------------+----------+---------+ Dist upper arm       0.42        0.98   branching +-----------------+-------------+----------+---------+ Antecubital fossa    0.54        0.32             +-----------------+-------------+----------+---------+ Prox forearm         0.41        1.19   branching +-----------------+-------------+----------+---------+ Mid forearm          0.31        0.55             +-----------------+-------------+----------+---------+ Wrist                0.28        0.72   branching +-----------------+-------------+----------+---------+ +--------------+-------------+----------+---------+ Right Basilic Diameter (cm)Depth (cm)Findings  +--------------+-------------+----------+---------+ Dist upper arm                       branching +--------------+-------------+----------+---------+ Prox forearm                         branching +--------------+-------------+----------+---------+ Mid forearm                          branching +--------------+-------------+----------+---------+ +-----------------+-------------+----------+---------+ Left Cephalic    Diameter (cm)Depth (cm)Findings  +-----------------+-------------+----------+---------+ Prox upper arm       0.52        1.29             +-----------------+-------------+----------+---------+ Mid upper arm        0.53        1.87             +-----------------+-------------+----------+---------+ Dist upper arm       0.59        1.36   branching +-----------------+-------------+----------+---------+ Antecubital fossa    0.72        0.96             +-----------------+-------------+----------+---------+ Prox forearm         0.39        1.24             +-----------------+-------------+----------+---------+ Mid forearm          0.46        1.54    branching +-----------------+-------------+----------+---------+ Wrist                0.40        0.81             +-----------------+-------------+----------+---------+ *See table(s) above for measurements and observations.  Diagnosing physician: Servando Snare MD Electronically signed by Servando Snare MD on 06/02/2018 at 5:52:22 PM.    Final      Scheduled Meds: . arformoterol  15 mcg Nebulization BID  . budesonide (PULMICORT) nebulizer solution  0.5 mg Nebulization BID  . Chlorhexidine Gluconate Cloth  6 each Topical Q0600  . darbepoetin (ARANESP) injection - DIALYSIS  60 mcg Intravenous Q Wed-HD  . diltiazem  240 mg Oral  Daily  . feeding supplement (NEPRO CARB STEADY)  237 mL Oral BID BM  . heparin      . hydrALAZINE  25 mg Oral Q8H  . insulin aspart  0-5 Units Subcutaneous QHS  . insulin aspart  0-9 Units Subcutaneous TID WC  . ipratropium-albuterol  3 mL Nebulization TID  . isosorbide dinitrate  10 mg Oral TID  . metoprolol succinate  100 mg Oral Daily  . multivitamin  1 tablet Oral QHS   Continuous Infusions: . sodium chloride 10 mL/hr at 05/30/18 0830  . sodium chloride    . sodium chloride    . ferric gluconate (FERRLECIT/NULECIT) IV 125 mg (06/03/18 1028)  . heparin 2,000 Units/hr (06/03/18 0009)     LOS: 13 days   Time Spent in minutes   30 minutes  Brynlynn Walko D.O. on 06/03/2018 at 10:42 AM  Between 7am to 7pm - Please see pager noted on [email or web address]  After 7pm go to [email or web address]  And look for the night coverage person covering for me after hours  [facility] Office  [phone]

## 2018-06-03 NOTE — Progress Notes (Signed)
Patient ID: [name], female   DOB: [birth year], 63 y.o.   MRN: [id number] The patient is currently on hemodialysis via her right IJ catheter without difficulty.  The good news is her vein map showed large caliber cephalic vein in the upper arms bilaterally.  The veins do run deep  The bad news is that despite writing a order 2 days ago for restriction of right arm, she has a IV in her wrist, a recent venipuncture in her right antecubital vein and a blood pressure cuff on her right upper arm.  She has no pink band.  I have had the access staff place a pink band on her now.  Will allow her to continue to improve.  We will plan right upper arm AV fistula on a nondialysis day next week.  If she is ready for discharge, can do this as an outpatient

## 2018-06-03 NOTE — Progress Notes (Addendum)
See yesterday's recommendations. Unable to review tele as patient in transport to HD. Will follow from distance.   [facility] will sign off for now -- call if Afib or other arrhythmia recurs -- Pls notify Cards Master when she is getting close to d/c for final med review & recommendations. Medication Recommendations:  See yesterday's note Other recommendations (labs, testing, etc):  Per yesterday's note Follow up as an outpatient:  Will need OP f/u arranged  Glenetta Hew, MD

## 2018-06-04 LAB — RENAL FUNCTION PANEL
Albumin: 2.8 g/dL — ABNORMAL LOW (ref 3.5–5.0)
Anion gap: 9 (ref 5–15)
BUN: 24 mg/dL — ABNORMAL HIGH (ref 8–23)
CO2: 26 mmol/L (ref 22–32)
Calcium: 8.9 mg/dL (ref 8.9–10.3)
Chloride: 99 mmol/L (ref 98–111)
Creatinine, Ser: 3.09 mg/dL — ABNORMAL HIGH (ref 0.44–1.00)
GFR calc Af Amer: 18 mL/min — ABNORMAL LOW
GFR calc non Af Amer: 15 mL/min — ABNORMAL LOW
Glucose, Bld: 152 mg/dL — ABNORMAL HIGH (ref 70–99)
Phosphorus: 2.5 mg/dL (ref 2.5–4.6)
Potassium: 4.2 mmol/L (ref 3.5–5.1)
Sodium: 134 mmol/L — ABNORMAL LOW (ref 135–145)

## 2018-06-04 LAB — CBC
HCT: 27.8 % — ABNORMAL LOW (ref 36.0–46.0)
Hemoglobin: 8.2 g/dL — ABNORMAL LOW (ref 12.0–15.0)
MCH: 27.8 pg (ref 26.0–34.0)
MCHC: 29.5 g/dL — ABNORMAL LOW (ref 30.0–36.0)
MCV: 94.2 fL (ref 80.0–100.0)
Platelets: 285 K/uL (ref 150–400)
RBC: 2.95 MIL/uL — ABNORMAL LOW (ref 3.87–5.11)
RDW: 14.1 % (ref 11.5–15.5)
WBC: 9.1 K/uL (ref 4.0–10.5)
nRBC: 0.2 % (ref 0.0–0.2)

## 2018-06-04 LAB — GLUCOSE, CAPILLARY
Glucose-Capillary: 149 mg/dL — ABNORMAL HIGH (ref 70–99)
Glucose-Capillary: 219 mg/dL — ABNORMAL HIGH (ref 70–99)
Glucose-Capillary: 187 mg/dL — ABNORMAL HIGH (ref 70–99)
Glucose-Capillary: 192 mg/dL — ABNORMAL HIGH (ref 70–99)

## 2018-06-04 LAB — HEPARIN LEVEL (UNFRACTIONATED): Heparin Unfractionated: 0.55 [IU]/mL (ref 0.30–0.70)

## 2018-06-04 MED ORDER — CAMPHOR-MENTHOL 0.5-0.5 % EX LOTN
TOPICAL_LOTION | CUTANEOUS | Status: DC | PRN
  Filled 2018-06-04: qty 222

## 2018-06-04 NOTE — Progress Notes (Signed)
PROGRESS NOTE    [name]  LXB:[id number] DOB: 09-07-1955 DOA: 05/21/2018 PCP: Rosita Fire, MD   Brief Narrative:  HPI on 05/21/2018 by [name] 63 year old female presents from [place] to [facility] with reported one day of respiratory distress, placed on BiPAP by EMS. On arrival to ED found to be in [place]. Given Amiodarone bolus Cardiology consulted. Preparing for cardioversion, however patient converted into wide complex tachycardia. Intubated, during intubation had notable emesis. Given Vancomycin/Zosyn. K 7.5, Crt 4.31. Administered Bicarb, Calcium, Albuterol, Insulin/Dextrose. ABG 7.15/45. LA 3.2. Transferred to [facility] for further treatment.   Recent admission 2/14-2/17 with CAP. Noted to have AKI which improved. At discharge Cr 1.63. CT Chest concerning for neoplastic process of left lower lung base.   Interim history Patient was found to have severe dyspnea requiring BiPAP by EMS.  On arrival in the ER, she was found to be in V. tach.  Patient was given an amiodarone bolus and then developed unstable wide-complex tachycardia and acute respiratory failure.  Patient was admitted by [facility] and was in the ICU and intubated.  TRH is assumed care. 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 -Oncology consulted and appreciated- will need dotatate PET scan   Acute renal failure on chronic kidney disease, stage III/hyperkalemia/metabolic acidosis/lactic  acidosis -Creatinine baseline 1.3-1.6, now she is dialysis dependent -Nephrology consulted and appreciated, pending further recommendations- transitioning to intermittent hemo -Vascular surgery consulted for [facility] and AVF placement (poss next week if still inpatient or outpatient if discharged)  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 -1 cm mobile AV vegetation -Blood cultures unremarkable x4 -Infectious disease consulted and appreciated, doubt culture-negative endocarditis but rather that this is either marantic or simply a calcified nodule  Acute on chronic diastolic CHF/hypertension -Patient appears to be volume overloaded, currently dialyzing for volume control -She failed Lasix therapy -BP stable -Continue aspirin, Isordil, metoprolol, diltiazem, hydralazine  Ventricular tachycardia/junctional rhythm in the setting of hyperkalemia -Resolved, continue metoprolol  Atrial fibrillation with RVR, paroxysmal -Cardiology consulted and appreciated- recommended Eliquis on discharge -Patient seems to have also developed self-limited A. fib post bronchoscopy however overnight patient had repeat episode of atrial fibrillation with heart rate in the 130s -Patient was given diltiazem  Sleep apnea -Continue BiPAP  Diabetes mellitus, type II -Continue insulin sliding scale with CBG monitoring  History of asthma -Continue budesonide, aformoterol, DuoNebs  Normocytic anemia  -hemoglobin 8.2 today -anemia panel: Iron 35, ferritin 266 -continue to monitor CBC  DVT Prophylaxis  Heparin  Code Status: Full  Family Communication: None at bedside  Disposition Plan: Admitted. Dispo TBD- pending nephrology recommendations  Consultants PCCM Cardiology Nephrology Interventional radiology Vascular surgery  Procedures  Intubation/Extubation Bronchoscopy  TEE  Antibiotics   Anti-infectives (From admission, onward)   Start     Dose/Rate Route Frequency Ordered Stop   05/31/18 0730  ceFAZolin  (ANCEF) IVPB 2g/100 mL premix     2 g 200 mL/hr over 30 Minutes Intravenous To Radiology 05/31/18 0557 05/31/18 1412   05/30/18 0830  ceFAZolin (ANCEF) IVPB 2g/100 mL premix     2 g 200  mL/hr over 30 Minutes Intravenous To Radiology 05/30/18 0824 05/30/18 1614   05/28/18 1000  amoxicillin-clavulanate (AUGMENTIN) 500-125 MG per tablet 500 mg     1 tablet Oral Daily 05/28/18 0949 05/29/18 0935   05/27/18 1400  Ampicillin-Sulbactam (UNASYN) 3 g in sodium chloride 0.9 % 100 mL IVPB  Status:  Discontinued     3 g 200 mL/hr over 30 Minutes Intravenous Every 12 hours 05/27/18 1039 05/28/18 0949   05/27/18 1200  vancomycin (VANCOCIN) IVPB 1000 mg/200 mL premix  Status:  Discontinued     1,000 mg 200 mL/hr over 60 Minutes Intravenous Every M-W-F (Hemodialysis) 05/25/18 1111 05/26/18 1600   05/25/18 1800  vancomycin (VANCOCIN) IVPB 1000 mg/200 mL premix  Status:  Discontinued     1,000 mg 200 mL/hr over 60 Minutes Intravenous Every M-W-F (Hemodialysis) 05/25/18 1106 05/25/18 1111   05/25/18 1500  vancomycin (VANCOCIN) 1,500 mg in sodium chloride 0.9 % 500 mL IVPB     1,500 mg 250 mL/hr over 120 Minutes Intravenous  Once 05/25/18 1111 05/25/18 1843   05/25/18 1400  Ampicillin-Sulbactam (UNASYN) 3 g in sodium chloride 0.9 % 100 mL IVPB  Status:  Discontinued     3 g 200 mL/hr over 30 Minutes Intravenous Every 12 hours 05/25/18 1111 05/27/18 1039   05/25/18 1200  Ampicillin-Sulbactam (UNASYN) 3 g in sodium chloride 0.9 % 100 mL IVPB  Status:  Discontinued     3 g 200 mL/hr over 30 Minutes Intravenous Every 12 hours 05/25/18 1108 05/25/18 1111   05/25/18 1130  vancomycin (VANCOCIN) IVPB 1000 mg/200 mL premix  Status:  Discontinued     1,000 mg 200 mL/hr over 60 Minutes Intravenous  Once 05/25/18 1106 05/25/18 1111   05/24/18 0600  ceFEPIme (MAXIPIME) 1 g in sodium chloride 0.9 % 100 mL IVPB  Status:  Discontinued     1 g 200 mL/hr over 30 Minutes Intravenous Every 24 hours 05/23/18 0557 05/23/18  1156   05/24/18 0600  ampicillin-sulbactam (UNASYN) 1.5 g in sodium chloride 0.9 % 100 mL IVPB  Status:  Discontinued     1.5 g 200 mL/hr over 30 Minutes Intravenous Every 24 hours 05/23/18 1159 05/23/18 1204   05/23/18 2200  ampicillin-sulbactam (UNASYN) 1.5 g in sodium chloride 0.9 % 100 mL IVPB  Status:  Discontinued     1.5 g 200 mL/hr over 30 Minutes Intravenous Every 24 hours 05/23/18 1204 05/25/18 1108   05/23/18 2000  ampicillin-sulbactam (UNASYN) 1.5 g in sodium chloride 0.9 % 100 mL IVPB  Status:  Discontinued     1.5 g 200 mL/hr over 30 Minutes Intravenous Every 24 hours 05/23/18 1156 05/23/18 1159   05/23/18 0600  ceFEPIme (MAXIPIME) 2 g in sodium chloride 0.9 % 100 mL IVPB     2 g 200 mL/hr over 30 Minutes Intravenous  Once 05/23/18 0548 05/23/18 0702   05/23/18 0600  vancomycin (VANCOCIN) 2,000 mg in sodium chloride 0.9 % 500 mL IVPB     2,000 mg 250 mL/hr over 120 Minutes Intravenous  Once 05/23/18 0548 05/23/18 0933   05/23/18 0559  vancomycin variable dose per unstable renal function (pharmacist dosing)  Status:  Discontinued      Does not apply See admin instructions 05/23/18 0559 05/23/18 1156   05/22/18 2200  vancomycin (VANCOCIN) IVPB 1000 mg/200 mL premix  Status:  Discontinued     1,000 mg 200 mL/hr over 60 Minutes Intravenous Every 24 hours 05/21/18 2047 05/21/18 2206   05/21/18 1730  vancomycin (  VANCOCIN) IVPB 1000 mg/200 mL premix  Status:  Discontinued     1,000 mg 200 mL/hr over 60 Minutes Intravenous  Once 05/21/18 1621 05/21/18 2206   05/21/18 1630  vancomycin (VANCOCIN) IVPB 1000 mg/200 mL premix     1,000 mg 200 mL/hr over 60 Minutes Intravenous  Once 05/21/18 1621 05/21/18 2309   05/21/18 1545  ceFEPIme (MAXIPIME) 2 g in sodium chloride 0.9 % 100 mL IVPB     2 g 200 mL/hr over 30 Minutes Intravenous  Once 05/21/18 1543 05/21/18 1745      Subjective:   [name] seen and examined today.  Complains of her back itching.  Denies current chest pain,  shortness of breath, abdominal pain, nausea or vomiting, diarrhea constipation, dizziness or headache. Objective:   Vitals:   06/04/18 0412 06/04/18 0553 06/04/18 0742 06/04/18 0821  BP: (!) 172/120 (!) 152/68 (!) 158/79   Pulse: 69  79 71  Resp: _0 Temp: 98.9 F (37.2 C)  97.6 F (36.4 C)   TempSrc: Oral  Oral   SpO2:    99%  Weight: 116.2 kg     Height:        Intake/Output Summary (Last 24 hours) at 06/04/2018 1057 Last data filed at 06/04/2018 1610 Gross per 24 hour  Intake 840 ml  Output 4000 ml  Net -3160 ml   Filed Weights   06/03/18 0718 06/03/18 1128 06/04/18 0412  Weight: 119.3 kg 115.6 kg 116.2 kg   Exam  General: Well developed, well nourished, NAD, appears stated age  42: NCAT, mucous membranes moist.   Neck: Supple  Cardiovascular: S1 S2 auscultated, RRR, no murmur  Respiratory: Clear to auscultation bilaterally   Abdomen: Soft, obese, nontender, nondistended, + bowel sounds  Extremities: warm dry without cyanosis clubbing or edema  Neuro: AAOx3,nonfocal  Psych: Appropriate mood and affect  Data Reviewed: I have personally reviewed following labs and imaging studies  CBC: Recent Labs  Lab 06/01/18 0335 06/01/18 1226 06/02/18 0029 06/03/18 0359 06/04/18 0447  WBC 7.6 6.8 7.4 8.9 9.1  HGB 8.3* 9.3* 7.9* 7.8* 8.2*  HCT 27.6* 29.9* 25.6* 25.0* 27.8*  MCV 91.4 90.9 92.4 92.6 94.2  PLT 281 303 259 288 960   Basic Metabolic Panel: Recent Labs  Lab 06/01/18 0335 06/01/18 1226 06/02/18 0029 06/03/18 0359 06/04/18 0447  NA 134* 136 133* 130* 134*  K 3.7 3.6 3.5 3.3* 4.2  CL 96* 98 96* 95* 99  CO2 _1 GLUCOSE 137* 117* 118* 187* 152*  BUN 55* 16 27* 49* 24*  CREATININE 4.44* 2.00* 3.02* 4.17* 3.09*  CALCIUM 8.2* 8.0* 7.8* 8.5* 8.9  PHOS 6.8* 2.6 3.1 3.1 2.5   GFR: Estimated Creatinine Clearance: 23.6 mL/min (A) (by C-G formula based on SCr of 3.09 mg/dL (H)). Liver Function Tests: Recent Labs  Lab  06/01/18 0335 06/01/18 1226 06/02/18 0029 06/03/18 0359 06/04/18 0447  AST  --   --  20  --   --   ALT  --   --  9  --   --   ALKPHOS  --   --  49  --   --   BILITOT  --   --  0.4  --   --   PROT  --   --  6.5  --   --   ALBUMIN 2.4* 2.6* 2.3*  2.3* 2.6* 2.8*   No results for input(s): LIPASE, AMYLASE in the last 168  hours. No results for input(s): AMMONIA in the last 168 hours. Coagulation Profile: Recent Labs  Lab 05/30/18 1015 05/31/18 0609  INR 1.7* 1.1   Cardiac Enzymes: No results for input(s): CKTOTAL, CKMB, CKMBINDEX, TROPONINI in the last 168 hours. BNP (last 3 results) No results for input(s): PROBNP in the last 8760 hours. HbA1C: No results for input(s): HGBA1C in the last 72 hours. CBG: Recent Labs  Lab 06/02/18 2218 06/03/18 1209 06/03/18 1716 06/03/18 2124 06/04/18 0735  GLUCAP 211* 116* 160* 204* 149*   Lipid Profile: Recent Labs    06/02/18 0029  CHOL 141  HDL 32*  LDLCALC 50  TRIG 295*  CHOLHDL 4.4   Thyroid Function Tests: Recent Labs    06/01/18 1406  TSH 3.292  FREET4 1.35   Anemia Panel: No results for input(s): VITAMINB12, FOLATE, FERRITIN, TIBC, IRON, RETICCTPCT in the last 72 hours. Urine analysis:    Component Value Date/Time   COLORURINE AMBER (A) 05/22/2018 0758   APPEARANCEUR CLOUDY (A) 05/22/2018 0758   LABSPEC 1.025 05/22/2018 0758   PHURINE 5.0 05/22/2018 0758   GLUCOSEU 50 (A) 05/22/2018 0758   HGBUR LARGE (A) 05/22/2018 0758   BILIRUBINUR NEGATIVE 05/22/2018 0758   KETONESUR 5 (A) 05/22/2018 0758   PROTEINUR 100 (A) 05/22/2018 0758   UROBILINOGEN 0.2 02/01/2013 1145   NITRITE NEGATIVE 05/22/2018 0758   LEUKOCYTESUR SMALL (A) 05/22/2018 0758   Sepsis Labs: _0 (procalcitonin:4,lacticidven:4)  ) Recent Results (from the past 240 hour(s))  Culture, bal-quantitative     Status: Abnormal   Collection Time: 05/26/18 12:17 PM  Result Value Ref Range Status   Specimen Description BRONCHIAL ALVEOLAR LAVAGE   Final   Special Requests LLL  Final   Gram Stain   Final    ABUNDANT WBC PRESENT, PREDOMINANTLY PMN NO ORGANISMS SEEN    Culture (A)  Final    30,000 COLONIES/mL Consistent with normal respiratory flora. Performed at [facility], [place] [address] [place], [place] [address]    Report Status 05/28/2018 FINAL  Final      Radiology Studies: Vas [place] Upper Ext Vein Mapping (pre-op Avf)  Result Date: 06/02/2018 UPPER EXTREMITY VEIN MAPPING  Indications: Pre-op dialysis access. Comparison Study: No prior study Performing Technologist: Sharion Dove RVS  Examination Guidelines: A complete evaluation includes B-mode imaging, spectral Doppler, color Doppler, and power Doppler as needed of all accessible portions of each vessel. Bilateral testing is considered an integral part of a complete examination. Limited examinations for reoccurring indications may be performed as noted. +-----------------+-------------+----------+---------+ Right Cephalic   Diameter (cm)Depth (cm)Findings  +-----------------+-------------+----------+---------+ Prox upper arm       0.49        1.57             +-----------------+-------------+----------+---------+ Mid upper arm        0.43        1.34             +-----------------+-------------+----------+---------+ Dist upper arm       0.42        0.98   branching +-----------------+-------------+----------+---------+ Antecubital fossa    0.54        0.32             +-----------------+-------------+----------+---------+ Prox forearm         0.41        1.19   branching +-----------------+-------------+----------+---------+ Mid forearm          0.31        0.55             +-----------------+-------------+----------+---------+  Wrist                0.28        0.72   branching +-----------------+-------------+----------+---------+ +--------------+-------------+----------+---------+ Right Basilic Diameter (cm)Depth (cm)Findings   +--------------+-------------+----------+---------+ Dist upper arm                       branching +--------------+-------------+----------+---------+ Prox forearm                         branching +--------------+-------------+----------+---------+ Mid forearm                          branching +--------------+-------------+----------+---------+ +-----------------+-------------+----------+---------+ Left Cephalic    Diameter (cm)Depth (cm)Findings  +-----------------+-------------+----------+---------+ Prox upper arm       0.52        1.29             +-----------------+-------------+----------+---------+ Mid upper arm        0.53        1.87             +-----------------+-------------+----------+---------+ Dist upper arm       0.59        1.36   branching +-----------------+-------------+----------+---------+ Antecubital fossa    0.72        0.96             +-----------------+-------------+----------+---------+ Prox forearm         0.39        1.24             +-----------------+-------------+----------+---------+ Mid forearm          0.46        1.54   branching +-----------------+-------------+----------+---------+ Wrist                0.40        0.81             +-----------------+-------------+----------+---------+ *See table(s) above for measurements and observations.  Diagnosing physician: Servando Snare MD Electronically signed by Servando Snare MD on 06/02/2018 at 5:52:22 PM.    Final      Scheduled Meds: . arformoterol  15 mcg Nebulization BID  . budesonide (PULMICORT) nebulizer solution  0.5 mg Nebulization BID  . Chlorhexidine Gluconate Cloth  6 each Topical Q0600  . darbepoetin (ARANESP) injection - DIALYSIS  60 mcg Intravenous Q Wed-HD  . diltiazem  240 mg Oral Daily  . feeding supplement (NEPRO CARB STEADY)  237 mL Oral BID BM  . hydrALAZINE  25 mg Oral Q8H  . insulin aspart  0-5 Units Subcutaneous QHS  . insulin aspart  0-9 Units  Subcutaneous TID WC  . ipratropium-albuterol  3 mL Nebulization TID  . isosorbide dinitrate  10 mg Oral TID  . metoprolol succinate  100 mg Oral Daily  . multivitamin  1 tablet Oral QHS   Continuous Infusions: . sodium chloride 10 mL/hr at 05/30/18 0830  . sodium chloride    . sodium chloride    . ferric gluconate (FERRLECIT/NULECIT) IV 125 mg (06/03/18 1028)  . heparin 2,100 Units/hr (06/04/18 0015)     LOS: 14 days   Time Spent in minutes   30 minutes  Shaelee Forni D.O. on 06/04/2018 at 10:57 AM  Between 7am to 7pm - Please see pager noted on [email or web address]  After 7pm go to [email or web address]  And look for the night coverage person covering for me after hours  [facility]  Office  [phone]

## 2018-06-04 NOTE — Progress Notes (Signed)
ANTICOAGULATION CONSULT NOTE  Pharmacy Consult for heparin Indication: atrial fibrillation  No Known Allergies  Patient Measurements: Height: 5\' 4"  (162.6 cm) Weight: 256 lb 1.6 oz (116.2 kg) IBW/kg (Calculated) : 54.7 Heparin Dosing Weight: 82.9 kg  Vital Signs: Temp: 97.6 F (36.4 C) (03/07 0742) Temp Source: Oral (03/07 0742) BP: 158/79 (03/07 0742) Pulse Rate: 71 (03/07 0821)  Labs: Recent Labs    06/02/18 0029  06/02/18 2230 06/03/18 0359 06/03/18 0945 06/04/18 0447  HGB 7.9*  --   --  7.8*  --  8.2*  HCT 25.6*  --   --  25.0*  --  27.8*  PLT 259  --   --  288  --  285  HEPARINUNFRC <0.10*   < > 0.23*  --  0.30 0.55  CREATININE 3.02*  --   --  4.17*  --  3.09*   < > = values in this interval not displayed.    Estimated Creatinine Clearance: 23.6 mL/min (A) (by C-G formula based on SCr of 3.09 mg/dL (H)).   Assessment: 63 y.o. female with newly recognized atrial fibrillation started on heparin drip per pharmacy. PMH includes HTN, DM, vascular disease. Plans for possible Eliquis initiation prior to discharge for long term anticoagulation.   Heparin level this morning therapeutic at 0.55, on 2100 units/hr. No s/sx of bleeding. Hgb stable at 8.2, plt 285. No infusion issues per nursing.   Goal of Therapy:  Heparin level 0.3-0.7 units/ml Monitor platelets by anticoagulation protocol: Yes   Plan:  Continue heparin 1750 units/hr Check heparin level daily  Monitor s/sx of bleeding and CBCs  Antonietta Jewel, PharmD, [place] Clinical Pharmacist  Pager: [phone] Phone: [phone] 06/04/2018   11:11 AM

## 2018-06-04 NOTE — Progress Notes (Signed)
[name] NEPHROLOGY PROGRESS NOTE  Assessment/ Plan: Pt is a 63 y.o. yo female with hypertension, diabetes, CKD 3 admitted with pneumonia, sepsis and possible aortic fall marantic lesion, AKI requiring CVVH in the beginning now on intermittent hemo-.  #AKI on CKD 3 in the setting of sepsis, concomitant ARB and possible cardiorenal syndrome.  Patient received CRRT from 2/22 and transition to intermittent hemo-on 2/24.  Patient remains oliguric and dialysis dependent.  -Status post TDC by IR on 3/3 -Vein mapping done, VVS consulted for AVF placement -Dialysis yesterday with 4 L ultrafiltration, tolerated well.  Plan for next dialysis on Monday.  Watch for renal recovery. -CLIP  #Aortic valve vegetation concern for culture-negative versus marantic AV lesion: TEE was done.  #Hyperkalemia: Improved  #Acute on chronic diastolic CHF: Now managing volume with dialysis.  #Anemia of CKD: Iron saturation 14%.  Continue IV iron and Aranesp.  Monitor CBC.    #Hypertension:   Monitor blood pressure.  Continue current cardiac medication.  #Hyperphosphatemia: PTH level 143.  Phosphorus level is low therefore discontinued Auryxia.  Subjective: Seen and examined.  Feeling good.  No new event.  Denied chest pain, shortness of breath, nausea vomiting. Objective Vital signs in last 24 hours: Vitals:   06/04/18 0553 06/04/18 0742 06/04/18 0821 06/04/18 1135  BP: (!) 152/68 (!) 158/79  (!) 113/54  Pulse:  79 71 74  Resp:  13 16   Temp:  97.6 F (36.4 C)    TempSrc:  Oral    SpO2:   99% 100%  Weight:      Height:       Weight change: -0.177 kg  Intake/Output Summary (Last 24 hours) at 06/04/2018 1146 Last data filed at 06/04/2018 0900 Gross per 24 hour  Intake 1080 ml  Output -  Net 1080 ml       Labs: Basic Metabolic Panel: Recent Labs  Lab 06/02/18 0029 06/03/18 0359 06/04/18 0447  NA 133* 130* 134*  K 3.5 3.3* 4.2  CL 96* 95* 99  CO2 27 23 26   GLUCOSE 118* 187*  152*  BUN 27* 49* 24*  CREATININE 3.02* 4.17* 3.09*  CALCIUM 7.8* 8.5* 8.9  PHOS 3.1 3.1 2.5   Liver Function Tests: Recent Labs  Lab 06/02/18 0029 06/03/18 0359 06/04/18 0447  AST 20  --   --   ALT 9  --   --   ALKPHOS 49  --   --   BILITOT 0.4  --   --   PROT 6.5  --   --   ALBUMIN 2.3*  2.3* 2.6* 2.8*   No results for input(s): LIPASE, AMYLASE in the last 168 hours. No results for input(s): AMMONIA in the last 168 hours. CBC: Recent Labs  Lab 06/01/18 0335 06/01/18 1226 06/02/18 0029 06/03/18 0359 06/04/18 0447  WBC 7.6 6.8 7.4 8.9 9.1  HGB 8.3* 9.3* 7.9* 7.8* 8.2*  HCT 27.6* 29.9* 25.6* 25.0* 27.8*  MCV 91.4 90.9 92.4 92.6 94.2  PLT 281 303 259 288 285   Cardiac Enzymes: No results for input(s): CKTOTAL, CKMB, CKMBINDEX, TROPONINI in the last 168 hours. CBG: Recent Labs  Lab 06/03/18 1209 06/03/18 1716 06/03/18 2124 06/04/18 0735 06/04/18 1059  GLUCAP 116* 160* 204* 149* 219*    Iron Studies:  No results for input(s): IRON, TIBC, TRANSFERRIN, FERRITIN in the last 72 hours. Studies/Results: Vas [place] Upper Ext Vein Mapping (pre-op Avf)  Result Date: 06/02/2018 UPPER EXTREMITY VEIN MAPPING  Indications: Pre-op dialysis access. Comparison  Study: No prior study Performing Technologist: Sharion Dove RVS  Examination Guidelines: A complete evaluation includes B-mode imaging, spectral Doppler, color Doppler, and power Doppler as needed of all accessible portions of each vessel. Bilateral testing is considered an integral part of a complete examination. Limited examinations for reoccurring indications may be performed as noted. +-----------------+-------------+----------+---------+ Right Cephalic   Diameter (cm)Depth (cm)Findings  +-----------------+-------------+----------+---------+ Prox upper arm       0.49        1.57             +-----------------+-------------+----------+---------+ Mid upper arm        0.43        1.34              +-----------------+-------------+----------+---------+ Dist upper arm       0.42        0.98   branching +-----------------+-------------+----------+---------+ Antecubital fossa    0.54        0.32             +-----------------+-------------+----------+---------+ Prox forearm         0.41        1.19   branching +-----------------+-------------+----------+---------+ Mid forearm          0.31        0.55             +-----------------+-------------+----------+---------+ Wrist                0.28        0.72   branching +-----------------+-------------+----------+---------+ +--------------+-------------+----------+---------+ Right Basilic Diameter (cm)Depth (cm)Findings  +--------------+-------------+----------+---------+ Dist upper arm                       branching +--------------+-------------+----------+---------+ Prox forearm                         branching +--------------+-------------+----------+---------+ Mid forearm                          branching +--------------+-------------+----------+---------+ +-----------------+-------------+----------+---------+ Left Cephalic    Diameter (cm)Depth (cm)Findings  +-----------------+-------------+----------+---------+ Prox upper arm       0.52        1.29             +-----------------+-------------+----------+---------+ Mid upper arm        0.53        1.87             +-----------------+-------------+----------+---------+ Dist upper arm       0.59        1.36   branching +-----------------+-------------+----------+---------+ Antecubital fossa    0.72        0.96             +-----------------+-------------+----------+---------+ Prox forearm         0.39        1.24             +-----------------+-------------+----------+---------+ Mid forearm          0.46        1.54   branching +-----------------+-------------+----------+---------+ Wrist                0.40        0.81              +-----------------+-------------+----------+---------+ *See table(s) above for measurements and observations.  Diagnosing physician: Servando Snare MD Electronically signed by Servando Snare MD on 06/02/2018 at 5:52:22 PM.  Final     Medications: Infusions: . sodium chloride 10 mL/hr at 05/30/18 0830  . sodium chloride    . sodium chloride    . ferric gluconate (FERRLECIT/NULECIT) IV 125 mg (06/03/18 1028)  . heparin 2,100 Units/hr (06/04/18 0015)    Scheduled Medications: . arformoterol  15 mcg Nebulization BID  . budesonide (PULMICORT) nebulizer solution  0.5 mg Nebulization BID  . Chlorhexidine Gluconate Cloth  6 each Topical Q0600  . darbepoetin (ARANESP) injection - DIALYSIS  60 mcg Intravenous Q Wed-HD  . diltiazem  240 mg Oral Daily  . feeding supplement (NEPRO CARB STEADY)  237 mL Oral BID BM  . hydrALAZINE  25 mg Oral Q8H  . insulin aspart  0-5 Units Subcutaneous QHS  . insulin aspart  0-9 Units Subcutaneous TID WC  . ipratropium-albuterol  3 mL Nebulization TID  . isosorbide dinitrate  10 mg Oral TID  . metoprolol succinate  100 mg Oral Daily  . multivitamin  1 tablet Oral QHS    have reviewed scheduled and prn medications.  Physical Exam: General: Asian female, not in distress Heart:RRR, s1s2 nl, no rubs Lungs: Clear bilateral, no wheezing or crackle Abdomen:soft, Non-tender, non-distended Extremities: Lower extremity pitting edema+. Dialysis Access: Tunneled catheter site looks clean  Chasten Blaze Prasad Tory Septer 06/04/2018,11:46 AM  LOS: 14 days

## 2018-06-05 LAB — RENAL FUNCTION PANEL
Albumin: 2.8 g/dL — ABNORMAL LOW (ref 3.5–5.0)
Anion gap: 9 (ref 5–15)
BUN: 45 mg/dL — ABNORMAL HIGH (ref 8–23)
CO2: 24 mmol/L (ref 22–32)
Calcium: 8.9 mg/dL (ref 8.9–10.3)
Chloride: 99 mmol/L (ref 98–111)
Creatinine, Ser: 4.28 mg/dL — ABNORMAL HIGH (ref 0.44–1.00)
GFR calc Af Amer: 12 mL/min — ABNORMAL LOW
GFR calc non Af Amer: 10 mL/min — ABNORMAL LOW
Glucose, Bld: 205 mg/dL — ABNORMAL HIGH (ref 70–99)
Phosphorus: 2.7 mg/dL (ref 2.5–4.6)
Potassium: 4.5 mmol/L (ref 3.5–5.1)
Sodium: 132 mmol/L — ABNORMAL LOW (ref 135–145)

## 2018-06-05 LAB — GLUCOSE, CAPILLARY
Glucose-Capillary: 165 mg/dL — ABNORMAL HIGH (ref 70–99)
Glucose-Capillary: 178 mg/dL — ABNORMAL HIGH (ref 70–99)
Glucose-Capillary: 178 mg/dL — ABNORMAL HIGH (ref 70–99)

## 2018-06-05 LAB — OCCULT BLOOD X 1 CARD TO LAB, STOOL: Fecal Occult Bld: POSITIVE — AB

## 2018-06-05 LAB — HEPARIN LEVEL (UNFRACTIONATED): Heparin Unfractionated: 0.44 IU/mL (ref 0.30–0.70)

## 2018-06-05 MED ORDER — CEFAZOLIN SODIUM-DEXTROSE 2-4 GM/100ML-% IV SOLN
2.0000 g | INTRAVENOUS | Status: AC
  Administered 2018-06-06: 2 g via INTRAVENOUS
  Filled 2018-06-05 (×4): qty 100

## 2018-06-05 MED ORDER — IPRATROPIUM-ALBUTEROL 0.5-2.5 (3) MG/3ML IN SOLN
3.0000 mL | Freq: Two times a day (BID) | RESPIRATORY_TRACT | Status: DC
  Administered 2018-06-06 – 2018-06-08 (×5): 3 mL via RESPIRATORY_TRACT
  Filled 2018-06-05 (×6): qty 3

## 2018-06-05 MED ORDER — CEFAZOLIN SODIUM-DEXTROSE 1-4 GM/50ML-% IV SOLN: 1.0000 g | INTRAVENOUS | Status: DC

## 2018-06-05 MED ORDER — CHLORHEXIDINE GLUCONATE CLOTH 2 % EX PADS
6.0000 | MEDICATED_PAD | Freq: Every day | CUTANEOUS | Status: DC
  Administered 2018-06-06 – 2018-06-09 (×3): 6 via TOPICAL

## 2018-06-05 NOTE — Progress Notes (Signed)
ANTICOAGULATION CONSULT NOTE  Pharmacy Consult for heparin Indication: atrial fibrillation  No Known Allergies  Patient Measurements: Height: 5\' 4"  (162.6 cm) Weight: 259 lb 6.4 oz (117.7 kg) IBW/kg (Calculated) : 54.7 Heparin Dosing Weight: 82.9 kg  Vital Signs: Temp: 97.8 F (36.6 C) (03/08 1155) Temp Source: Oral (03/08 1155) BP: 144/64 (03/08 1155) Pulse Rate: 63 (03/08 1155)  Labs: Recent Labs    06/03/18 0359 06/03/18 0945 06/04/18 0447 06/05/18 0441  HGB 7.8*  --  8.2*  --   HCT 25.0*  --  27.8*  --   PLT 288  --  285  --   HEPARINUNFRC  --  0.30 0.55 0.44  CREATININE 4.17*  --  3.09* 4.28*    Estimated Creatinine Clearance: 17.2 mL/min (A) (by C-G formula based on SCr of 4.28 mg/dL (H)).   Assessment: 63 y.o. female with newly recognized atrial fibrillation started on heparin drip per pharmacy. PMH includes HTN, DM, vascular disease. Plans for possible Eliquis initiation prior to discharge for long term anticoagulation.   Heparin level this morning therapeutic at 0.44, on 2100 units/hr. No s/sx of bleeding. Hgb stable at 8.2, plt 285 on 3/7. No infusion issues per nursing.   Goal of Therapy:  Heparin level 0.3-0.7 units/ml Monitor platelets by anticoagulation protocol: Yes   Plan:  Continue heparin at 2100 units/hr Check heparin level daily  Monitor s/sx of bleeding and CBCs  Antonietta Jewel, PharmD, [place] Clinical Pharmacist  Pager: [phone] Phone: [phone] 06/05/2018   12:45 PM

## 2018-06-05 NOTE — Progress Notes (Signed)
[facility]  Subjective  - No complaints.   Objective 120/61 67 99 F (37.2 C) (Axillary) 17 100%  Intake/Output Summary (Last 24 hours) at 06/05/2018 1056 Last data filed at 06/05/2018 0905 Gross per 24 hour  Intake 480 ml  Output -  Net 480 ml    Right radial and brachial pulse palpable - right arm restricted RIJ tunneled dialysis catheter  Laboratory Lab Results: Recent Labs    06/03/18 0359 06/04/18 0447  WBC 8.9 9.1  HGB 7.8* 8.2*  HCT 25.0* 27.8*  PLT 288 285   BMET Recent Labs    06/04/18 0447 06/05/18 0441  NA 134* 132*  K 4.2 4.5  CL 99 99  CO2 26 24  GLUCOSE 152* 205*  BUN 24* 45*  CREATININE 3.09* 4.28*  CALCIUM 8.9 8.9    COAG Lab Results  Component Value Date   INR 1.1 05/31/2018   INR 1.7 (H) 05/30/2018   INR 1.00 09/21/2011   No results found for: PTT  Assessment/Planning:  Plan for RUE AVF vs graft placement tomorrow.  Please keep NPO after midnight and restrict right arm.  Discussed with Dr. Carolin Sicks with nephrology and ok for access placement in am and can dialyze in afternoon.  Call vascular with questions or concerns.  Marty Heck 06/05/2018 10:56 AM --

## 2018-06-05 NOTE — Progress Notes (Signed)
PROGRESS NOTE    CALA KRUCKENBERG  WRU:[id number] DOB: [birth year] DOA: 05/21/2018 PCP: Rosita Fire, MD   Brief Narrative:  HPI on 05/21/2018 by [name] 63 year old female presents from [place] to [facility] with reported one day of respiratory distress, placed on BiPAP by EMS. On arrival to ED found to be in [place]. Given Amiodarone bolus Cardiology consulted. Preparing for cardioversion, however patient converted into wide complex tachycardia. Intubated, during intubation had notable emesis. Given Vancomycin/Zosyn. K 7.5, Crt 4.31. Administered Bicarb, Calcium, Albuterol, Insulin/Dextrose. ABG 7.15/45. LA 3.2. Transferred to [facility] for further treatment.   Recent admission 2/14-2/17 with CAP. Noted to have AKI which improved. At discharge Cr 1.63. CT Chest concerning for neoplastic process of left lower lung base.   Interim history Patient was found to have severe dyspnea requiring BiPAP by EMS.  On arrival in the ER, she was found to be in V. tach.  Patient was given an amiodarone bolus and then developed unstable wide-complex tachycardia and acute respiratory failure.  Patient was admitted by [facility] and was in the ICU and intubated.  TRH assumed care. 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 -Oncology consulted and appreciated- will need dotatate PET scan   Acute renal failure on chronic kidney disease, stage III/hyperkalemia/metabolic acidosis/lactic  acidosis -Creatinine baseline 1.3-1.6, now she is dialysis dependent -Nephrology consulted and appreciated, pending further recommendations- transitioning to intermittent hemo -Vascular surgery consulted for [facility] and AVF placement (poss next week if still inpatient or outpatient if discharged)  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 -1 cm mobile AV vegetation -Blood cultures unremarkable x4 -Infectious disease consulted and appreciated, doubt culture-negative endocarditis but rather that this is either marantic or simply a calcified nodule  Acute on chronic diastolic CHF/hypertension -Patient appears to be volume overloaded, currently dialyzing for volume control -She failed Lasix therapy -BP stable -Continue aspirin, Isordil, metoprolol, diltiazem, hydralazine  Ventricular tachycardia/junctional rhythm in the setting of hyperkalemia -Resolved, continue metoprolol  Atrial fibrillation with RVR, paroxysmal -Cardiology consulted and appreciated- recommended Eliquis on discharge -Patient seems to have also developed self-limited A. fib post bronchoscopy however overnight patient had repeat episode of atrial fibrillation with heart rate in the 130s -Patient was given diltiazem  Sleep apnea -Continue BiPAP  Diabetes mellitus, type II -Continue insulin sliding scale with CBG monitoring  History of asthma -Continue budesonide, aformoterol, DuoNebs  Normocytic anemia  -hemoglobin 8.2  -anemia panel: Iron 35, ferritin 266 -continue to monitor CBC  DVT Prophylaxis  Heparin  Code Status: Full  Family Communication: None at bedside  Disposition Plan: Admitted. Dispo TBD- pending nephrology recommendations and possible vascular - access placement next week  Consultants PCCM Cardiology Nephrology Interventional radiology Vascular surgery  Procedures  Intubation/Extubation Bronchoscopy  TEE  Antibiotics   Anti-infectives (From admission, onward)   Start     Dose/Rate Route  Frequency Ordered Stop   06/06/18 0000  ceFAZolin (ANCEF) IVPB 1 g/50 mL premix    Note to Pharmacy:  Send with pt to OR   1 g 100 mL/hr over 30 Minutes Intravenous On call 06/05/18 1029 06/07/18 0000  05/31/18 0730  ceFAZolin (ANCEF) IVPB 2g/100 mL premix     2 g 200 mL/hr over 30 Minutes Intravenous To Radiology 05/31/18 0557 05/31/18 1412   05/30/18 0830  ceFAZolin (ANCEF) IVPB 2g/100 mL premix     2 g 200 mL/hr over 30 Minutes Intravenous To Radiology 05/30/18 0824 05/30/18 1614   05/28/18 1000  amoxicillin-clavulanate (AUGMENTIN) 500-125 MG per tablet 500 mg     1 tablet Oral Daily 05/28/18 0949 05/29/18 0935   05/27/18 1400  Ampicillin-Sulbactam (UNASYN) 3 g in sodium chloride 0.9 % 100 mL IVPB  Status:  Discontinued     3 g 200 mL/hr over 30 Minutes Intravenous Every 12 hours 05/27/18 1039 05/28/18 0949   05/27/18 1200  vancomycin (VANCOCIN) IVPB 1000 mg/200 mL premix  Status:  Discontinued     1,000 mg 200 mL/hr over 60 Minutes Intravenous Every M-W-F (Hemodialysis) 05/25/18 1111 05/26/18 1600   05/25/18 1800  vancomycin (VANCOCIN) IVPB 1000 mg/200 mL premix  Status:  Discontinued     1,000 mg 200 mL/hr over 60 Minutes Intravenous Every M-W-F (Hemodialysis) 05/25/18 1106 05/25/18 1111   05/25/18 1500  vancomycin (VANCOCIN) 1,500 mg in sodium chloride 0.9 % 500 mL IVPB     1,500 mg 250 mL/hr over 120 Minutes Intravenous  Once 05/25/18 1111 05/25/18 1843   05/25/18 1400  Ampicillin-Sulbactam (UNASYN) 3 g in sodium chloride 0.9 % 100 mL IVPB  Status:  Discontinued     3 g 200 mL/hr over 30 Minutes Intravenous Every 12 hours 05/25/18 1111 05/27/18 1039   05/25/18 1200  Ampicillin-Sulbactam (UNASYN) 3 g in sodium chloride 0.9 % 100 mL IVPB  Status:  Discontinued     3 g 200 mL/hr over 30 Minutes Intravenous Every 12 hours 05/25/18 1108 05/25/18 1111   05/25/18 1130  vancomycin (VANCOCIN) IVPB 1000 mg/200 mL premix  Status:  Discontinued     1,000 mg 200 mL/hr over 60 Minutes  Intravenous  Once 05/25/18 1106 05/25/18 1111   05/24/18 0600  ceFEPIme (MAXIPIME) 1 g in sodium chloride 0.9 % 100 mL IVPB  Status:  Discontinued     1 g 200 mL/hr over 30 Minutes Intravenous Every 24 hours 05/23/18 0557 05/23/18 1156   05/24/18 0600  ampicillin-sulbactam (UNASYN) 1.5 g in sodium chloride 0.9 % 100 mL IVPB  Status:  Discontinued     1.5 g 200 mL/hr over 30 Minutes Intravenous Every 24 hours 05/23/18 1159 05/23/18 1204   05/23/18 2200  ampicillin-sulbactam (UNASYN) 1.5 g in sodium chloride 0.9 % 100 mL IVPB  Status:  Discontinued     1.5 g 200 mL/hr over 30 Minutes Intravenous Every 24 hours 05/23/18 1204 05/25/18 1108   05/23/18 2000  ampicillin-sulbactam (UNASYN) 1.5 g in sodium chloride 0.9 % 100 mL IVPB  Status:  Discontinued     1.5 g 200 mL/hr over 30 Minutes Intravenous Every 24 hours 05/23/18 1156 05/23/18 1159   05/23/18 0600  ceFEPIme (MAXIPIME) 2 g in sodium chloride 0.9 % 100 mL IVPB     2 g 200 mL/hr over 30 Minutes Intravenous  Once 05/23/18 0548 05/23/18 0702   05/23/18 0600  vancomycin (VANCOCIN) 2,000 mg in sodium chloride 0.9 % 500 mL IVPB     2,000 mg 250 mL/hr over 120 Minutes Intravenous  Once 05/23/18 0548 05/23/18 0933   05/23/18 0559  vancomycin variable dose per unstable renal function (pharmacist dosing)  Status:  Discontinued      Does not apply See admin instructions  05/23/18 0559 05/23/18 1156   05/22/18 2200  vancomycin (VANCOCIN) IVPB 1000 mg/200 mL premix  Status:  Discontinued     1,000 mg 200 mL/hr over 60 Minutes Intravenous Every 24 hours 05/21/18 2047 05/21/18 2206   05/21/18 1730  vancomycin (VANCOCIN) IVPB 1000 mg/200 mL premix  Status:  Discontinued     1,000 mg 200 mL/hr over 60 Minutes Intravenous  Once 05/21/18 1621 05/21/18 2206   05/21/18 1630  vancomycin (VANCOCIN) IVPB 1000 mg/200 mL premix     1,000 mg 200 mL/hr over 60 Minutes Intravenous  Once 05/21/18 1621 05/21/18 2309   05/21/18 1545  ceFEPIme (MAXIPIME) 2 g in sodium  chloride 0.9 % 100 mL IVPB     2 g 200 mL/hr over 30 Minutes Intravenous  Once 05/21/18 1543 05/21/18 1745      Subjective:   [name] seen and examined today.  Patient has no complaints today.  Denies shortness of breath, chest pain, abdominal pain, nausea or vomiting, diarrhea or constipation, dizziness or headache. Objective:   Vitals:   06/04/18 2334 06/05/18 0334 06/05/18 0736 06/05/18 0845  BP: (!) 128/56 (!) 156/67 120/61   Pulse: 66 72 68 67  Resp: _0 Temp: 98.6 F (37 C) 98.1 F (36.7 C) 99 F (37.2 C)   TempSrc: Oral Oral Axillary   SpO2: 99% 100% 100% 100%  Weight:  117.7 kg    Height:        Intake/Output Summary (Last 24 hours) at 06/05/2018 1039 Last data filed at 06/05/2018 0905 Gross per 24 hour  Intake 480 ml  Output -  Net 480 ml   Filed Weights   06/03/18 1128 06/04/18 0412 06/05/18 0334  Weight: 115.6 kg 116.2 kg 117.7 kg   Exam  General: Well developed, well nourished, NAD, appears stated age  33: NCAT,mucous membranes moist.   Cardiovascular: S1 S2 auscultated, RRR, no murmur  Respiratory: Clear to auscultation bilaterally with equal chest rise  Abdomen: Soft, obese, nontender, nondistended, + bowel sounds  Extremities: warm dry without cyanosis clubbing or edema  Neuro: AAOx3, nonfocal  Psych: Appropriate mood and affect, pleasant   Data Reviewed: I have personally reviewed following labs and imaging studies  CBC: Recent Labs  Lab 06/01/18 0335 06/01/18 1226 06/02/18 0029 06/03/18 0359 06/04/18 0447  WBC 7.6 6.8 7.4 8.9 9.1  HGB 8.3* 9.3* 7.9* 7.8* 8.2*  HCT 27.6* 29.9* 25.6* 25.0* 27.8*  MCV 91.4 90.9 92.4 92.6 94.2  PLT 281 303 259 288 482   Basic Metabolic Panel: Recent Labs  Lab 06/01/18 1226 06/02/18 0029 06/03/18 0359 06/04/18 0447 06/05/18 0441  NA 136 133* 130* 134* 132*  K 3.6 3.5 3.3* 4.2 4.5  CL 98 96* 95* 99 99  CO2 _1 GLUCOSE 117* 118* 187* 152* 205*  BUN 16 27* 49* 24*  45*  CREATININE 2.00* 3.02* 4.17* 3.09* 4.28*  CALCIUM 8.0* 7.8* 8.5* 8.9 8.9  PHOS 2.6 3.1 3.1 2.5 2.7   GFR: Estimated Creatinine Clearance: 17.2 mL/min (A) (by C-G formula based on SCr of 4.28 mg/dL (H)). Liver Function Tests: Recent Labs  Lab 06/01/18 1226 06/02/18 0029 06/03/18 0359 06/04/18 0447 06/05/18 0441  AST  --  20  --   --   --   ALT  --  9  --   --   --   ALKPHOS  --  49  --   --   --   BILITOT  --  0.4  --   --   --   PROT  --  6.5  --   --   --   ALBUMIN 2.6* 2.3*  2.3* 2.6* 2.8* 2.8*   No results for input(s): LIPASE, AMYLASE in the last 168 hours. No results for input(s): AMMONIA in the last 168 hours. Coagulation Profile: Recent Labs  Lab 05/30/18 1015 05/31/18 0609  INR 1.7* 1.1   Cardiac Enzymes: No results for input(s): CKTOTAL, CKMB, CKMBINDEX, TROPONINI in the last 168 hours. BNP (last 3 results) No results for input(s): PROBNP in the last 8760 hours. HbA1C: No results for input(s): HGBA1C in the last 72 hours. CBG: Recent Labs  Lab 06/03/18 2124 06/04/18 0735 06/04/18 1059 06/04/18 1605 06/04/18 2133  GLUCAP 204* 149* 219* 187* 192*   Lipid Profile: No results for input(s): CHOL, HDL, LDLCALC, TRIG, CHOLHDL, LDLDIRECT in the last 72 hours. Thyroid Function Tests: No results for input(s): TSH, T4TOTAL, FREET4, T3FREE, THYROIDAB in the last 72 hours. Anemia Panel: No results for input(s): VITAMINB12, FOLATE, FERRITIN, TIBC, IRON, RETICCTPCT in the last 72 hours. Urine analysis:    Component Value Date/Time   COLORURINE AMBER (A) 05/22/2018 0758   APPEARANCEUR CLOUDY (A) 05/22/2018 0758   LABSPEC 1.025 05/22/2018 0758   PHURINE 5.0 05/22/2018 0758   GLUCOSEU 50 (A) 05/22/2018 0758   HGBUR LARGE (A) 05/22/2018 0758   BILIRUBINUR NEGATIVE 05/22/2018 0758   KETONESUR 5 (A) 05/22/2018 0758   PROTEINUR 100 (A) 05/22/2018 0758   UROBILINOGEN 0.2 02/01/2013 1145   NITRITE NEGATIVE 05/22/2018 0758   LEUKOCYTESUR SMALL (A) 05/22/2018  0758   Sepsis Labs: _0 (procalcitonin:4,lacticidven:4)  ) Recent Results (from the past 240 hour(s))  Culture, bal-quantitative     Status: Abnormal   Collection Time: 05/26/18 12:17 PM  Result Value Ref Range Status   Specimen Description BRONCHIAL ALVEOLAR LAVAGE  Final   Special Requests LLL  Final   Gram Stain   Final    ABUNDANT WBC PRESENT, PREDOMINANTLY PMN NO ORGANISMS SEEN    Culture (A)  Final    30,000 COLONIES/mL Consistent with normal respiratory flora. Performed at [facility], [place] [name] [name], [place] [address]    Report Status 05/28/2018 FINAL  Final      Radiology Studies: No results found.   Scheduled Meds: . arformoterol  15 mcg Nebulization BID  . budesonide (PULMICORT) nebulizer solution  0.5 mg Nebulization BID  . Chlorhexidine Gluconate Cloth  6 each Topical Q0600  . darbepoetin (ARANESP) injection - DIALYSIS  60 mcg Intravenous Q Wed-HD  . diltiazem  240 mg Oral Daily  . feeding supplement (NEPRO CARB STEADY)  237 mL Oral BID BM  . hydrALAZINE  25 mg Oral Q8H  . insulin aspart  0-5 Units Subcutaneous QHS  . insulin aspart  0-9 Units Subcutaneous TID WC  . ipratropium-albuterol  3 mL Nebulization TID  . isosorbide dinitrate  10 mg Oral TID  . metoprolol succinate  100 mg Oral Daily  . multivitamin  1 tablet Oral QHS   Continuous Infusions: . sodium chloride 10 mL/hr at 05/30/18 0830  . sodium chloride    . sodium chloride    . [START ON 06/06/2018]  ceFAZolin (ANCEF) IV    . ferric gluconate (FERRLECIT/NULECIT) IV 125 mg (06/03/18 1028)  . heparin 2,100 Units/hr (06/05/18 0538)     LOS: 15 days   Time Spent in minutes   20 minutes  Nikeya Maxim D.O. on 06/05/2018 at 10:39 AM  Between 7am to 7pm - Please see pager noted on [email or web address]  After 7pm go to [email or web address]  And look for the night coverage person covering for me after hours  [facility] Office  [phone]

## 2018-06-05 NOTE — Progress Notes (Signed)
[facility] NEPHROLOGY PROGRESS NOTE  Assessment/ Plan: Pt is a 63 y.o. yo female with hypertension, diabetes, CKD 3 admitted with pneumonia, sepsis and possible aortic fall marantic lesion, AKI requiring CVVH in the beginning now on intermittent hemo-.  #AKI on CKD 3 in the setting of sepsis, concomitant ARB and possible cardiorenal syndrome.  Patient received CRRT from 2/22 and transition to intermittent hemo-on 2/24.  Patient remains oliguric and dialysis dependent.  -Status post TDC by IR on 3/3 -Vein mapping done, VVS consulted for AVF placement plan for OR tomorrow discussed with Dr. Carlis Abbott. -Plan dialysis later tomorrow after the surgery, watch for renal recovery. -CLIP in progress  #Aortic valve vegetation concern for culture-negative versus marantic AV lesion: TEE was done.  #Hyperkalemia: Improved  #Acute on chronic diastolic CHF: Now managing volume with dialysis.  #Anemia of CKD: Iron saturation 14%.  Continue IV iron and Aranesp.  Monitor CBC.    #Hypertension:   Monitor blood pressure.  Continue current cardiac medication.  #Hyperphosphatemia: PTH level 143.  Phosphorus level is low therefore discontinued Auryxia.  Subjective: Seen and examined.  Denied nausea, vomiting, chest pain, shortness of breath.  No abdominal pain. Objective Vital signs in last 24 hours: Vitals:   06/05/18 0737 06/05/18 0845 06/05/18 0905 06/05/18 1155  BP: 120/61  (!) 157/58 (!) 144/64  Pulse: 68 67 68 63  Resp: 18 17 17    Temp:    97.8 F (36.6 C)  TempSrc:    Oral  SpO2: 100% 100% 100% 100%  Weight:      Height:       Weight change: -1.637 kg  Intake/Output Summary (Last 24 hours) at 06/05/2018 1322 Last data filed at 06/05/2018 0905 Gross per 24 hour  Intake 480 ml  Output -  Net 480 ml       Labs: Basic Metabolic Panel: Recent Labs  Lab 06/03/18 0359 06/04/18 0447 06/05/18 0441  NA 130* 134* 132*  K 3.3* 4.2 4.5  CL 95* 99 99  CO2 23 26 24   GLUCOSE  187* 152* 205*  BUN 49* 24* 45*  CREATININE 4.17* 3.09* 4.28*  CALCIUM 8.5* 8.9 8.9  PHOS 3.1 2.5 2.7   Liver Function Tests: Recent Labs  Lab 06/02/18 0029 06/03/18 0359 06/04/18 0447 06/05/18 0441  AST 20  --   --   --   ALT 9  --   --   --   ALKPHOS 49  --   --   --   BILITOT 0.4  --   --   --   PROT 6.5  --   --   --   ALBUMIN 2.3*  2.3* 2.6* 2.8* 2.8*   No results for input(s): LIPASE, AMYLASE in the last 168 hours. No results for input(s): AMMONIA in the last 168 hours. CBC: Recent Labs  Lab 06/01/18 0335 06/01/18 1226 06/02/18 0029 06/03/18 0359 06/04/18 0447  WBC 7.6 6.8 7.4 8.9 9.1  HGB 8.3* 9.3* 7.9* 7.8* 8.2*  HCT 27.6* 29.9* 25.6* 25.0* 27.8*  MCV 91.4 90.9 92.4 92.6 94.2  PLT 281 303 259 288 285   Cardiac Enzymes: No results for input(s): CKTOTAL, CKMB, CKMBINDEX, TROPONINI in the last 168 hours. CBG: Recent Labs  Lab 06/03/18 2124 06/04/18 0735 06/04/18 1059 06/04/18 1605 06/04/18 2133  GLUCAP 204* 149* 219* 187* 192*    Iron Studies:  No results for input(s): IRON, TIBC, TRANSFERRIN, FERRITIN in the last 72 hours. Studies/Results: No results found.  Medications: Infusions: .  sodium chloride 10 mL/hr at 05/30/18 0830  . sodium chloride    . sodium chloride    . [START ON 06/06/2018]  ceFAZolin (ANCEF) IV    . ferric gluconate (FERRLECIT/NULECIT) IV 125 mg (06/03/18 1028)  . heparin 2,100 Units/hr (06/05/18 0538)    Scheduled Medications: . arformoterol  15 mcg Nebulization BID  . budesonide (PULMICORT) nebulizer solution  0.5 mg Nebulization BID  . Chlorhexidine Gluconate Cloth  6 each Topical Q0600  . darbepoetin (ARANESP) injection - DIALYSIS  60 mcg Intravenous Q Wed-HD  . diltiazem  240 mg Oral Daily  . feeding supplement (NEPRO CARB STEADY)  237 mL Oral BID BM  . hydrALAZINE  25 mg Oral Q8H  . insulin aspart  0-5 Units Subcutaneous QHS  . insulin aspart  0-9 Units Subcutaneous TID WC  . ipratropium-albuterol  3 mL  Nebulization TID  . isosorbide dinitrate  10 mg Oral TID  . metoprolol succinate  100 mg Oral Daily  . multivitamin  1 tablet Oral QHS    have reviewed scheduled and prn medications.  Physical Exam: General: Not in distress, comfortable Heart:RRR, s1s2 nl, no rubs Lungs: Clear bilateral, no wheezing or crackle Abdomen:soft, Non-tender, non-distended Extremities: Lower extremity pitting edema+. Dialysis Access: Tunneled catheter site looks clean  [name] 06/05/2018,1:22 PM  LOS: 15 days

## 2018-06-06 ENCOUNTER — Encounter (HOSPITAL_COMMUNITY)
Admission: EM | Disposition: A | Discharge status: Skilled Nursing Facility | Payer: Self-pay | Source: Home / Self Care | Attending: Internal Medicine

## 2018-06-06 ENCOUNTER — Inpatient Hospital Stay (HOSPITAL_COMMUNITY): Payer: Medicare Other | Admitting: Certified Registered Nurse Anesthetist

## 2018-06-06 ENCOUNTER — Encounter (HOSPITAL_COMMUNITY): Payer: Self-pay

## 2018-06-06 ENCOUNTER — Other Ambulatory Visit: Payer: Self-pay

## 2018-06-06 HISTORY — PX: AV FISTULA PLACEMENT: SHX1204

## 2018-06-06 LAB — RENAL FUNCTION PANEL
Albumin: 2.9 g/dL — ABNORMAL LOW (ref 3.5–5.0)
Anion gap: 10 (ref 5–15)
BUN: 60 mg/dL — ABNORMAL HIGH (ref 8–23)
CO2: 24 mmol/L (ref 22–32)
Calcium: 9.2 mg/dL (ref 8.9–10.3)
Chloride: 99 mmol/L (ref 98–111)
Creatinine, Ser: 4.85 mg/dL — ABNORMAL HIGH (ref 0.44–1.00)
GFR calc Af Amer: 10 mL/min — ABNORMAL LOW (ref >60)
GFR calc non Af Amer: 9 mL/min — ABNORMAL LOW (ref >60)
Glucose, Bld: 176 mg/dL — ABNORMAL HIGH (ref 70–99)
Phosphorus: 3.6 mg/dL (ref 2.5–4.6)
Potassium: 4.5 mmol/L (ref 3.5–5.1)
Sodium: 133 mmol/L — ABNORMAL LOW (ref 135–145)

## 2018-06-06 LAB — CBC
HCT: 25.5 % — ABNORMAL LOW (ref 36.0–46.0)
Hemoglobin: 7.7 g/dL — ABNORMAL LOW (ref 12.0–15.0)
MCH: 29.1 pg (ref 26.0–34.0)
MCHC: 30.2 g/dL (ref 30.0–36.0)
MCV: 96.2 fL (ref 80.0–100.0)
Platelets: 283 10*3/uL (ref 150–400)
RBC: 2.65 MIL/uL — ABNORMAL LOW (ref 3.87–5.11)
RDW: 15.3 % (ref 11.5–15.5)
WBC: 10.2 10*3/uL (ref 4.0–10.5)
nRBC: 0.2 % (ref 0.0–0.2)

## 2018-06-06 LAB — GLUCOSE, CAPILLARY
Glucose-Capillary: 184 mg/dL — ABNORMAL HIGH (ref 70–99)
Glucose-Capillary: 177 mg/dL — ABNORMAL HIGH (ref 70–99)
Glucose-Capillary: 179 mg/dL — ABNORMAL HIGH (ref 70–99)
Glucose-Capillary: 143 mg/dL — ABNORMAL HIGH (ref 70–99)
Glucose-Capillary: 131 mg/dL — ABNORMAL HIGH (ref 70–99)
Glucose-Capillary: 154 mg/dL — ABNORMAL HIGH (ref 70–99)
Glucose-Capillary: 217 mg/dL — ABNORMAL HIGH (ref 70–99)

## 2018-06-06 LAB — HEPARIN LEVEL (UNFRACTIONATED): Heparin Unfractionated: 0.56 IU/mL (ref 0.30–0.70)

## 2018-06-06 LAB — PROTIME-INR
INR: 1.1 (ref 0.8–1.2)
Prothrombin Time: 13.6 seconds (ref 11.4–15.2)

## 2018-06-06 SURGERY — ARTERIOVENOUS (AV) FISTULA CREATION
Anesthesia: General | Site: Arm Upper | Laterality: Right

## 2018-06-06 MED ORDER — 0.9 % SODIUM CHLORIDE (POUR BTL) OPTIME
TOPICAL | Status: DC | PRN
  Administered 2018-06-06: 1000 mL

## 2018-06-06 MED ORDER — LIDOCAINE 2% (20 MG/ML) 5 ML SYRINGE
INTRAMUSCULAR | Status: DC | PRN
  Administered 2018-06-06: 100 mg via INTRAVENOUS

## 2018-06-06 MED ORDER — BUPIVACAINE-EPINEPHRINE (PF) 0.25% -1:200000 IJ SOLN
INTRAMUSCULAR | Status: AC
  Filled 2018-06-06: qty 30

## 2018-06-06 MED ORDER — ONDANSETRON HCL 4 MG/2ML IJ SOLN
INTRAMUSCULAR | Status: AC
  Filled 2018-06-06: qty 6

## 2018-06-06 MED ORDER — DEXAMETHASONE SODIUM PHOSPHATE 10 MG/ML IJ SOLN
INTRAMUSCULAR | Status: DC | PRN
  Administered 2018-06-06: 5 mg via INTRAVENOUS

## 2018-06-06 MED ORDER — FENTANYL CITRATE (PF) 250 MCG/5ML IJ SOLN
INTRAMUSCULAR | Status: AC
  Filled 2018-06-06: qty 5

## 2018-06-06 MED ORDER — LIDOCAINE HCL (CARDIAC) PF 100 MG/5ML IV SOSY: PREFILLED_SYRINGE | INTRAVENOUS | Status: DC | PRN

## 2018-06-06 MED ORDER — PROPOFOL 10 MG/ML IV BOLUS
INTRAVENOUS | Status: DC | PRN
  Administered 2018-06-06: 150 mg via INTRAVENOUS

## 2018-06-06 MED ORDER — LIDOCAINE 2% (20 MG/ML) 5 ML SYRINGE
INTRAMUSCULAR | Status: AC
  Filled 2018-06-06: qty 10

## 2018-06-06 MED ORDER — SODIUM CHLORIDE 0.9 % IV SOLN
INTRAVENOUS | Status: DC
  Administered 2018-06-06: 10:00:00 via INTRAVENOUS

## 2018-06-06 MED ORDER — MEPERIDINE HCL 50 MG/ML IJ SOLN: 6.2500 mg | INTRAMUSCULAR | Status: DC | PRN

## 2018-06-06 MED ORDER — EPHEDRINE SULFATE-NACL 50-0.9 MG/10ML-% IV SOSY
PREFILLED_SYRINGE | INTRAVENOUS | Status: DC | PRN
  Administered 2018-06-06 (×3): 10 mg via INTRAVENOUS

## 2018-06-06 MED ORDER — FENTANYL CITRATE (PF) 100 MCG/2ML IJ SOLN
INTRAMUSCULAR | Status: AC
  Administered 2018-06-06: 25 ug via INTRAVENOUS
  Filled 2018-06-06: qty 2

## 2018-06-06 MED ORDER — SODIUM CHLORIDE 0.9 % IV SOLN
INTRAVENOUS | Status: AC
  Filled 2018-06-06: qty 1.2

## 2018-06-06 MED ORDER — SODIUM CHLORIDE 0.9 % IV SOLN
INTRAVENOUS | Status: DC | PRN
  Administered 2018-06-06: 500 mL

## 2018-06-06 MED ORDER — FENTANYL CITRATE (PF) 100 MCG/2ML IJ SOLN
INTRAMUSCULAR | Status: DC | PRN
  Administered 2018-06-06: 50 ug via INTRAVENOUS

## 2018-06-06 MED ORDER — ROCURONIUM BROMIDE 50 MG/5ML IV SOSY
PREFILLED_SYRINGE | INTRAVENOUS | Status: AC
  Filled 2018-06-06: qty 25

## 2018-06-06 MED ORDER — SODIUM CHLORIDE 0.9 % IV SOLN
INTRAVENOUS | Status: DC | PRN
  Administered 2018-06-06: 30 ug/min via INTRAVENOUS

## 2018-06-06 MED ORDER — EPHEDRINE 5 MG/ML INJ
INTRAVENOUS | Status: AC
  Filled 2018-06-06: qty 20

## 2018-06-06 MED ORDER — PROMETHAZINE HCL 25 MG/ML IJ SOLN: 6.2500 mg | INTRAMUSCULAR | Status: DC | PRN

## 2018-06-06 MED ORDER — DEXAMETHASONE SODIUM PHOSPHATE 10 MG/ML IJ SOLN
INTRAMUSCULAR | Status: AC
  Filled 2018-06-06: qty 2

## 2018-06-06 MED ORDER — LIDOCAINE-EPINEPHRINE (PF) 1 %-1:200000 IJ SOLN
INTRAMUSCULAR | Status: AC
  Filled 2018-06-06: qty 30

## 2018-06-06 MED ORDER — DEXAMETHASONE SODIUM PHOSPHATE 10 MG/ML IJ SOLN
INTRAMUSCULAR | Status: AC
  Filled 2018-06-06: qty 1

## 2018-06-06 MED ORDER — MIDAZOLAM HCL 2 MG/2ML IJ SOLN
INTRAMUSCULAR | Status: AC
  Filled 2018-06-06: qty 2

## 2018-06-06 MED ORDER — LIDOCAINE HCL (PF) 1 % IJ SOLN
INTRAMUSCULAR | Status: AC
  Filled 2018-06-06: qty 30

## 2018-06-06 MED ORDER — ONDANSETRON HCL 4 MG/2ML IJ SOLN
INTRAMUSCULAR | Status: DC | PRN
  Administered 2018-06-06: 4 mg via INTRAVENOUS

## 2018-06-06 MED ORDER — LIDOCAINE 2% (20 MG/ML) 5 ML SYRINGE
INTRAMUSCULAR | Status: AC
  Filled 2018-06-06: qty 5

## 2018-06-06 MED ORDER — ONDANSETRON HCL 4 MG/2ML IJ SOLN
INTRAMUSCULAR | Status: AC
  Filled 2018-06-06: qty 2

## 2018-06-06 MED ORDER — LACTATED RINGERS IV SOLN: INTRAVENOUS | Status: DC

## 2018-06-06 MED ORDER — FENTANYL CITRATE (PF) 100 MCG/2ML IJ SOLN
25.0000 ug | INTRAMUSCULAR | Status: DC | PRN
  Administered 2018-06-06 (×2): 25 ug via INTRAVENOUS

## 2018-06-06 MED ORDER — HEPARIN SODIUM (PORCINE) 1000 UNIT/ML IJ SOLN
INTRAMUSCULAR | Status: AC
  Filled 2018-06-06: qty 2

## 2018-06-06 SURGICAL SUPPLY — 35 items
ADH SKN CLS APL DERMABOND .7 (GAUZE/BANDAGES/DRESSINGS) ×1
AGENT HMST SPONGE THK3/8 (HEMOSTASIS)
ARMBAND PINK RESTRICT EXTREMIT (MISCELLANEOUS) ×4 IMPLANT
CANISTER SUCT 3000ML PPV (MISCELLANEOUS) ×2 IMPLANT
CLIP VESOCCLUDE MED 6/CT (CLIP) ×2 IMPLANT
CLIP VESOCCLUDE SM WIDE 6/CT (CLIP) ×2 IMPLANT
COVER PROBE W GEL 5X96 (DRAPES) ×2 IMPLANT
COVER WAND RF STERILE (DRAPES) ×2 IMPLANT
DECANTER SPIKE VIAL GLASS SM (MISCELLANEOUS) ×2 IMPLANT
DERMABOND ADVANCED (GAUZE/BANDAGES/DRESSINGS) ×1
DERMABOND ADVANCED .7 DNX12 (GAUZE/BANDAGES/DRESSINGS) ×1 IMPLANT
ELECT REM PT RETURN 9FT ADLT (ELECTROSURGICAL) ×2
ELECTRODE REM PT RTRN 9FT ADLT (ELECTROSURGICAL) ×1 IMPLANT
GLOVE BIO SURGEON STRL SZ7.5 (GLOVE) ×2 IMPLANT
GLOVE BIOGEL PI IND STRL 8 (GLOVE) ×1 IMPLANT
GLOVE BIOGEL PI INDICATOR 8 (GLOVE) ×1
GOWN STRL REUS W/ TWL LRG LVL3 (GOWN DISPOSABLE) ×2 IMPLANT
GOWN STRL REUS W/ TWL XL LVL3 (GOWN DISPOSABLE) ×2 IMPLANT
GOWN STRL REUS W/TWL LRG LVL3 (GOWN DISPOSABLE) ×4
GOWN STRL REUS W/TWL XL LVL3 (GOWN DISPOSABLE) ×4
HEMOSTAT SPONGE AVITENE ULTRA (HEMOSTASIS) IMPLANT
KIT BASIN OR (CUSTOM PROCEDURE TRAY) ×2 IMPLANT
KIT TURNOVER KIT B (KITS) ×2 IMPLANT
NS IRRIG 1000ML POUR BTL (IV SOLUTION) ×2 IMPLANT
PACK CV ACCESS (CUSTOM PROCEDURE TRAY) ×2 IMPLANT
PAD ARMBOARD 7.5X6 YLW CONV (MISCELLANEOUS) ×4 IMPLANT
SUT MNCRL AB 4-0 PS2 18 (SUTURE) ×2 IMPLANT
SUT PROLENE 6 0 BV (SUTURE) ×3 IMPLANT
SUT PROLENE 7 0 BV 1 (SUTURE) IMPLANT
SUT SILK 3 0 SH CR/8 (SUTURE) ×1 IMPLANT
SUT VIC AB 3-0 SH 27 (SUTURE) ×4
SUT VIC AB 3-0 SH 27X BRD (SUTURE) ×1 IMPLANT
TOWEL GREEN STERILE (TOWEL DISPOSABLE) ×2 IMPLANT
UNDERPAD 30X30 (UNDERPADS AND DIAPERS) ×2 IMPLANT
WATER STERILE IRR 1000ML POUR (IV SOLUTION) ×2 IMPLANT

## 2018-06-06 NOTE — Anesthesia Preprocedure Evaluation (Addendum)
Anesthesia Evaluation  Patient identified by MRN, date of birth, ID band Patient awake    Reviewed: Allergy & Precautions, NPO status , Patient's Chart, lab work & pertinent test results  Airway Mallampati: II  TM Distance: >3 FB Neck ROM: Full    Dental  (+) Dental Advisory Given   Pulmonary asthma , sleep apnea , pneumonia, former smoker,    Pulmonary exam normal breath sounds clear to auscultation       Cardiovascular hypertension, + CAD and +CHF  Normal cardiovascular exam Rhythm:Regular Rate:Normal     Neuro/Psych negative neurological ROS  negative psych ROS   GI/Hepatic negative GI ROS, Neg liver ROS,   Endo/Other  diabetes  Renal/GU Renal disease     Musculoskeletal  (+) Arthritis ,   Abdominal (+) + obese,   Peds  Hematology  (+) Blood dyscrasia, anemia ,   Anesthesia Other Findings   Reproductive/Obstetrics negative OB ROS                           Anesthesia Physical Anesthesia Plan  ASA: III  Anesthesia Plan: General   Post-op Pain Management:    Induction: Intravenous  PONV Risk Score and Plan: 4 or greater and Ondansetron, Dexamethasone and Treatment may vary due to age or medical condition  Airway Management Planned: LMA  Additional Equipment: None  Intra-op Plan:   Post-operative Plan: Extubation in OR  Informed Consent: I have reviewed the patients History and Physical, chart, labs and discussed the procedure including the risks, benefits and alternatives for the proposed anesthesia with the patient or authorized representative who has indicated his/her understanding and acceptance.     Dental advisory given  Plan Discussed with: CRNA  Anesthesia Plan Comments:         Anesthesia Quick Evaluation

## 2018-06-06 NOTE — Anesthesia Procedure Notes (Signed)
Procedure Name: LMA Insertion Date/Time: 06/06/2018 3:43 PM Performed by: Kathryne Hitch, CRNA Pre-anesthesia Checklist: Patient identified, Emergency Drugs available, Suction available, Patient being monitored and Timeout performed Patient Re-evaluated:Patient Re-evaluated prior to induction Oxygen Delivery Method: Circle system utilized Preoxygenation: Pre-oxygenation with 100% oxygen Induction Type: IV induction Ventilation: Mask ventilation without difficulty LMA: LMA inserted LMA Size: 5.0 Number of attempts: 1 Placement Confirmation: positive ETCO2 and breath sounds checked- equal and bilateral Tube secured with: Tape Dental Injury: Teeth and Oropharynx as per pre-operative assessment

## 2018-06-06 NOTE — Social Work (Signed)
CSW spoke to patient's legal guardian, [name], on the phone and discussed SNF bed offers. Guardian chose [facility] in [place]. CSW confirmed bed offer at [facility].   CSW following for medical readiness and HD clip. Patient will need [facility] authorization prior to admitting to the facility. Facility to initiate auth when patient closer to discharge. Will need some updated therapy notes.   Estanislado Emms, LCSW [phone]

## 2018-06-06 NOTE — Progress Notes (Signed)
PROGRESS NOTE    [name]  ZOX:[id number] DOB: [birth year] DOA: 05/21/2018 PCP: Rosita Fire, MD   Brief Narrative:  HPI on 05/21/2018 by [name] 63 year old female presents from [place] to [facility] with reported one day of respiratory distress, placed on BiPAP by EMS. On arrival to ED found to be in [place]. Given Amiodarone bolus Cardiology consulted. Preparing for cardioversion, however patient converted into wide complex tachycardia. Intubated, during intubation had notable emesis. Given Vancomycin/Zosyn. K 7.5, Crt 4.31. Administered Bicarb, Calcium, Albuterol, Insulin/Dextrose. ABG 7.15/45. LA 3.2. Transferred to [facility] for further treatment.   Recent admission 2/14-2/17 with CAP. Noted to have AKI which improved. At discharge Cr 1.63. CT Chest concerning for neoplastic process of left lower lung base.   Interim history Patient was found to have severe dyspnea requiring BiPAP by EMS.  On arrival in the ER, she was found to be in V. tach.  Patient was given an amiodarone bolus and then developed unstable wide-complex tachycardia and acute respiratory failure.  Patient was admitted by [facility] and was in the ICU and intubated.  TRH assumed care. 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 -Oncology consulted and appreciated- will need dotatate PET scan   Acute renal failure on chronic kidney disease, stage III/hyperkalemia/metabolic acidosis/lactic  acidosis -Creatinine baseline 1.3-1.6, now she is dialysis dependent -Nephrology consulted and appreciated, pending further recommendations- transitioning to intermittent hemo -Vascular surgery consulted for [facility] and AVF placement- pending today  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 -1 cm mobile AV vegetation -Blood cultures unremarkable x4 -Infectious disease consulted and appreciated, doubt culture-negative endocarditis but rather that this is either marantic or simply a calcified nodule  Acute on chronic diastolic CHF/hypertension -Patient appears to be volume overloaded, currently dialyzing for volume control -She failed Lasix therapy -BP stable -Continue aspirin, Isordil, metoprolol, diltiazem, hydralazine  Ventricular tachycardia/junctional rhythm in the setting of hyperkalemia -Resolved, continue metoprolol  Atrial fibrillation with RVR, paroxysmal -currently back in SR -Cardiology consulted and appreciated- recommended Eliquis on discharge -Patient seems to have also developed self-limited A. fib post bronchoscopy however overnight patient had repeat episode of atrial fibrillation with heart rate in the 130s -Patient was given diltiazem  Sleep apnea -Continue BiPAP  Diabetes mellitus, type II -Continue insulin sliding scale with CBG monitoring  History of asthma -Continue budesonide, aformoterol, DuoNebs  Normocytic anemia  -hemoglobin 8.2  -anemia panel: Iron 35, ferritin 266 -continue to monitor CBC  DVT Prophylaxis  Heparin  Code Status: Full  Family Communication: None at bedside  Disposition Plan: Admitted. Dispo TBD- pending CLIP  Consultants PCCM Cardiology Nephrology Interventional radiology Vascular surgery  Procedures  Intubation/Extubation Bronchoscopy  TEE  Antibiotics   Anti-infectives (From admission, onward)   Start     Dose/Rate Route Frequency Ordered Stop   06/06/18 0000  ceFAZolin (ANCEF) IVPB 1 g/50 mL premix  Status:  Discontinued     Note to Pharmacy:  Send with pt to OR   1 g 100 mL/hr over 30 Minutes Intravenous On call 06/05/18 1029 06/05/18 1559   06/06/18 0000  [MAR Hold]  ceFAZolin (  ANCEF) IVPB 2g/100 mL premix     (MAR Hold since Mon 06/06/2018 at 0938. Reason: Transfer to a Procedural area.)  Note to Pharmacy:  Send with pt to OR   2 g 200 mL/hr over 30 Minutes Intravenous On call 06/05/18 1559 06/07/18 0000   05/31/18 0730  ceFAZolin (ANCEF) IVPB 2g/100 mL premix     2 g 200 mL/hr over 30 Minutes Intravenous To Radiology 05/31/18 0557 05/31/18 1412   05/30/18 0830  ceFAZolin (ANCEF) IVPB 2g/100 mL premix     2 g 200 mL/hr over 30 Minutes Intravenous To Radiology 05/30/18 0824 05/30/18 1614   05/28/18 1000  amoxicillin-clavulanate (AUGMENTIN) 500-125 MG per tablet 500 mg     1 tablet Oral Daily 05/28/18 0949 05/29/18 0935   05/27/18 1400  Ampicillin-Sulbactam (UNASYN) 3 g in sodium chloride 0.9 % 100 mL IVPB  Status:  Discontinued     3 g 200 mL/hr over 30 Minutes Intravenous Every 12 hours 05/27/18 1039 05/28/18 0949   05/27/18 1200  vancomycin (VANCOCIN) IVPB 1000 mg/200 mL premix  Status:  Discontinued     1,000 mg 200 mL/hr over 60 Minutes Intravenous Every M-W-F (Hemodialysis) 05/25/18 1111 05/26/18 1600   05/25/18 1800  vancomycin (VANCOCIN) IVPB 1000 mg/200 mL premix  Status:  Discontinued     1,000 mg 200 mL/hr over 60 Minutes Intravenous Every M-W-F (Hemodialysis) 05/25/18 1106 05/25/18 1111   05/25/18 1500  vancomycin (VANCOCIN) 1,500 mg in sodium chloride 0.9 % 500 mL IVPB     1,500 mg 250 mL/hr over 120 Minutes Intravenous  Once 05/25/18 1111 05/25/18 1843   05/25/18 1400  Ampicillin-Sulbactam (UNASYN) 3 g in sodium chloride 0.9 % 100 mL IVPB  Status:  Discontinued     3 g 200 mL/hr over 30 Minutes Intravenous Every 12 hours 05/25/18 1111 05/27/18 1039   05/25/18 1200  Ampicillin-Sulbactam (UNASYN) 3 g in sodium chloride 0.9 % 100 mL IVPB  Status:  Discontinued     3 g 200 mL/hr over 30 Minutes  Intravenous Every 12 hours 05/25/18 1108 05/25/18 1111   05/25/18 1130  vancomycin (VANCOCIN) IVPB 1000 mg/200 mL premix  Status:  Discontinued     1,000 mg 200 mL/hr over 60 Minutes Intravenous  Once 05/25/18 1106 05/25/18 1111   05/24/18 0600  ceFEPIme (MAXIPIME) 1 g in sodium chloride 0.9 % 100 mL IVPB  Status:  Discontinued     1 g 200 mL/hr over 30 Minutes Intravenous Every 24 hours 05/23/18 0557 05/23/18 1156   05/24/18 0600  ampicillin-sulbactam (UNASYN) 1.5 g in sodium chloride 0.9 % 100 mL IVPB  Status:  Discontinued     1.5 g 200 mL/hr over 30 Minutes Intravenous Every 24 hours 05/23/18 1159 05/23/18 1204   05/23/18 2200  ampicillin-sulbactam (UNASYN) 1.5 g in sodium chloride 0.9 % 100 mL IVPB  Status:  Discontinued     1.5 g 200 mL/hr over 30 Minutes Intravenous Every 24 hours 05/23/18 1204 05/25/18 1108   05/23/18 2000  ampicillin-sulbactam (UNASYN) 1.5 g in sodium chloride 0.9 % 100 mL IVPB  Status:  Discontinued     1.5 g 200 mL/hr over 30 Minutes Intravenous Every 24 hours 05/23/18 1156 05/23/18 1159   05/23/18 0600  ceFEPIme (MAXIPIME) 2 g in sodium chloride 0.9 % 100 mL IVPB     2 g 200 mL/hr over 30 Minutes Intravenous  Once 05/23/18 0548 05/23/18 0702   05/23/18 0600  vancomycin (VANCOCIN) 2,000 mg in sodium chloride 0.9 %  500 mL IVPB     2,000 mg 250 mL/hr over 120 Minutes Intravenous  Once 05/23/18 0548 05/23/18 0933   05/23/18 0559  vancomycin variable dose per unstable renal function (pharmacist dosing)  Status:  Discontinued      Does not apply See admin instructions 05/23/18 0559 05/23/18 1156   05/22/18 2200  vancomycin (VANCOCIN) IVPB 1000 mg/200 mL premix  Status:  Discontinued     1,000 mg 200 mL/hr over 60 Minutes Intravenous Every 24 hours 05/21/18 2047 05/21/18 2206   05/21/18 1730  vancomycin (VANCOCIN) IVPB 1000 mg/200 mL premix  Status:  Discontinued     1,000 mg 200 mL/hr over 60 Minutes Intravenous  Once 05/21/18 1621 05/21/18 2206   05/21/18 1630   vancomycin (VANCOCIN) IVPB 1000 mg/200 mL premix     1,000 mg 200 mL/hr over 60 Minutes Intravenous  Once 05/21/18 1621 05/21/18 2309   05/21/18 1545  ceFEPIme (MAXIPIME) 2 g in sodium chloride 0.9 % 100 mL IVPB     2 g 200 mL/hr over 30 Minutes Intravenous  Once 05/21/18 1543 05/21/18 1745      Subjective:   [name] seen and examined today.  Patient has no complaints today.  Denies current chest pain, shortness breath, abdominal pain, nausea or vomiting, diarrhea or constipation, dizziness or headache.   Objective:   Vitals:   06/05/18 2052 06/06/18 0030 06/06/18 0455 06/06/18 0824  BP:  94/82 (!) 125/52 (!) 154/67  Pulse:  (!) 59 63 71  Resp:  19 19 (!) 22  Temp:  98.9 F (37.2 C) 98.6 F (37 C)   TempSrc:  Oral Axillary   SpO2: 96% 100% 93% 99%  Weight:   118.8 kg   Height:        Intake/Output Summary (Last 24 hours) at 06/06/2018 1230 Last data filed at 06/06/2018 0800 Gross per 24 hour  Intake 1689.67 ml  Output -  Net 1689.67 ml   Filed Weights   06/04/18 0412 06/05/18 0334 06/06/18 0455  Weight: 116.2 kg 117.7 kg 118.8 kg   Exam  General: Well developed, well nourished, NAD, appears stated age  HEENT: NCAT, mucous membranes moist.   Cardiovascular: S1 S2 auscultated, RRR, no murmur  Extremities: warm dry without cyanosis clubbing. LE Edema  Neuro: AAOx3, nonfocal  Psych: Appropriate mood and affect, pleasant   Data Reviewed: I have personally reviewed following labs and imaging studies  CBC: Recent Labs  Lab 06/01/18 1226 06/02/18 0029 06/03/18 0359 06/04/18 0447 06/06/18 0352  WBC 6.8 7.4 8.9 9.1 10.2  HGB 9.3* 7.9* 7.8* 8.2* 7.7*  HCT 29.9* 25.6* 25.0* 27.8* 25.5*  MCV 90.9 92.4 92.6 94.2 96.2  PLT 303 259 288 285 597   Basic Metabolic Panel: Recent Labs  Lab 06/02/18 0029 06/03/18 0359 06/04/18 0447 06/05/18 0441 06/06/18 0352  NA 133* 130* 134* 132* 133*  K 3.5 3.3* 4.2 4.5 4.5  CL 96* 95* 99 99 99  CO2 27 23 26 24 24     GLUCOSE 118* 187* 152* 205* 176*  BUN 27* 49* 24* 45* 60*  CREATININE 3.02* 4.17* 3.09* 4.28* 4.85*  CALCIUM 7.8* 8.5* 8.9 8.9 9.2  PHOS 3.1 3.1 2.5 2.7 3.6   GFR: Estimated Creatinine Clearance: 15.2 mL/min (A) (by C-G formula based on SCr of 4.85 mg/dL (H)). Liver Function Tests: Recent Labs  Lab 06/02/18 0029 06/03/18 0359 06/04/18 0447 06/05/18 0441 06/06/18 0352  AST 20  --   --   --   --  ALT 9  --   --   --   --   ALKPHOS 49  --   --   --   --   BILITOT 0.4  --   --   --   --   PROT 6.5  --   --   --   --   ALBUMIN 2.3*  2.3* 2.6* 2.8* 2.8* 2.9*   No results for input(s): LIPASE, AMYLASE in the last 168 hours. No results for input(s): AMMONIA in the last 168 hours. Coagulation Profile: Recent Labs  Lab 05/31/18 0609 06/06/18 0352  INR 1.1 1.1   Cardiac Enzymes: No results for input(s): CKTOTAL, CKMB, CKMBINDEX, TROPONINI in the last 168 hours. BNP (last 3 results) No results for input(s): PROBNP in the last 8760 hours. HbA1C: No results for input(s): HGBA1C in the last 72 hours. CBG: Recent Labs  Lab 06/05/18 1149 06/05/18 1612 06/05/18 2233 06/06/18 0750 06/06/18 0957  GLUCAP 165* 178* 178* 177* 179*   Lipid Profile: No results for input(s): CHOL, HDL, LDLCALC, TRIG, CHOLHDL, LDLDIRECT in the last 72 hours. Thyroid Function Tests: No results for input(s): TSH, T4TOTAL, FREET4, T3FREE, THYROIDAB in the last 72 hours. Anemia Panel: No results for input(s): VITAMINB12, FOLATE, FERRITIN, TIBC, IRON, RETICCTPCT in the last 72 hours. Urine analysis:    Component Value Date/Time   COLORURINE AMBER (A) 05/22/2018 0758   APPEARANCEUR CLOUDY (A) 05/22/2018 0758   LABSPEC 1.025 05/22/2018 0758   PHURINE 5.0 05/22/2018 0758   GLUCOSEU 50 (A) 05/22/2018 0758   HGBUR LARGE (A) 05/22/2018 0758   BILIRUBINUR NEGATIVE 05/22/2018 0758   KETONESUR 5 (A) 05/22/2018 0758   PROTEINUR 100 (A) 05/22/2018 0758   UROBILINOGEN 0.2 02/01/2013 1145   NITRITE NEGATIVE  05/22/2018 0758   LEUKOCYTESUR SMALL (A) 05/22/2018 0758   Sepsis Labs: @LABRCNTIP (procalcitonin:4,lacticidven:4)  ) No results found for this or any previous visit (from the past 240 hour(s)).    Radiology Studies: No results found.   Scheduled Meds: . [MAR Hold] arformoterol  15 mcg Nebulization BID  . [MAR Hold] budesonide (PULMICORT) nebulizer solution  0.5 mg Nebulization BID  . [MAR Hold] Chlorhexidine Gluconate Cloth  6 each Topical Q0600  . [MAR Hold] darbepoetin (ARANESP) injection - DIALYSIS  60 mcg Intravenous Q Wed-HD  . [MAR Hold] diltiazem  240 mg Oral Daily  . [MAR Hold] feeding supplement (NEPRO CARB STEADY)  237 mL Oral BID BM  . [MAR Hold] hydrALAZINE  25 mg Oral Q8H  . [MAR Hold] insulin aspart  0-5 Units Subcutaneous QHS  . [MAR Hold] insulin aspart  0-9 Units Subcutaneous TID WC  . [MAR Hold] ipratropium-albuterol  3 mL Nebulization BID  . [MAR Hold] isosorbide dinitrate  10 mg Oral TID  . [MAR Hold] metoprolol succinate  100 mg Oral Daily  . [MAR Hold] multivitamin  1 tablet Oral QHS   Continuous Infusions: . [MAR Hold] sodium chloride 10 mL/hr at 05/30/18 0830  . [MAR Hold] sodium chloride    . [MAR Hold] sodium chloride    . sodium chloride 10 mL/hr at 06/06/18 0955  . [MAR Hold]  ceFAZolin (ANCEF) IV    . [MAR Hold] ferric gluconate (FERRLECIT/NULECIT) IV 125 mg (06/03/18 1028)  . heparin 2,100 Units/hr (06/06/18 0255)     LOS: 16 days   Time Spent in minutes   20 minutes  Toshio Slusher D.O. on 06/06/2018 at 12:30 PM  Between 7am to 7pm - Please see pager noted on [email or web address]  After  7pm go to [email or web address]  And look for the night coverage person covering for me after hours  [facility] Office  [phone]

## 2018-06-06 NOTE — Progress Notes (Signed)
Nutrition Follow-up  DOCUMENTATION CODES:   Morbid obesity  INTERVENTION:   Continue:  Nepro Shake po BID, each supplement provides 425 kcal and 19 grams protein  Renal multivitamin daily  NUTRITION DIAGNOSIS:   Inadequate oral intake related to acute illness, poor appetite as evidenced by meal completion < 50%.  Ongoing  GOAL:   Patient will meet greater than or equal to 90% of their needs  Progressing   MONITOR:   PO intake, Supplement acceptance, Labs, Skin  ASSESSMENT:   63 yo female with PMH of HTN, DM, anemia, CHF, mental handicap, asthma, arthritis who was admitted from [place] to [facility] with respiratory distress, V Tach; intubated and transferred to [facility] 2/22.  Patient currently out of her room for AV fistula placement.  HD dependent since 2/22. HD scheduled for today.   Awaiting clip process for outpatient HD.   Remains on renal diet, consuming 50-100% of all meals.  Also receiving Nepro supplement BID. Labs and medications reviewed. Sodium 133 (L), creatinine 4.85 (H), BUN 60 (H) CBG's: [phone]   Diet Order:   Diet Order            Diet NPO time specified Except for: Sips with Meds  Diet effective midnight              EDUCATION NEEDS:   No education needs have been identified at this time  Skin:  Skin Assessment: Reviewed RN Assessment  Last BM:  3/8  Height:   Ht Readings from Last 1 Encounters:  06/01/18 5\' 4"  (1.626 m)    Weight:   Wt Readings from Last 1 Encounters:  06/06/18 118.8 kg    Ideal Body Weight:  54.5 kg  BMI:  Body mass index is 44.97 kg/m.  Estimated Nutritional Needs:   Kcal:  1700-1900  Protein:  105-120 gm  Fluid:  1 L + UOP    Molli Barrows, RD, LDN, [place] Pager [phone] After Hours Pager [phone]

## 2018-06-06 NOTE — Transfer of Care (Signed)
Immediate Anesthesia Transfer of Care Note  Patient: [name]  Procedure(s) Performed: ARTERIOVENOUS (AV) FISTULA CREATION (Right Arm Upper)  Patient Location: PACU  Anesthesia Type:General  Level of Consciousness: drowsy  Airway & Oxygen Therapy: Patient Spontanous Breathing and Patient connected to face mask oxygen  Post-op Assessment: Report given to RN and Post -op Vital signs reviewed and stable  Post vital signs: Reviewed and stable  Last Vitals:  Vitals Value Taken Time  BP 140/85 06/06/2018  5:16 PM  Temp    Pulse 64 06/06/2018  5:17 PM  Resp 16 06/06/2018  5:17 PM  SpO2 99 % 06/06/2018  5:17 PM  Vitals shown include unvalidated device data.  Last Pain:  Vitals:   06/06/18 0825  TempSrc:   PainSc: 0-No pain      Patients Stated Pain Goal: 0 (95/28/41 3244)  Complications: No apparent anesthesia complications

## 2018-06-06 NOTE — Progress Notes (Signed)
[facility] ROUNDING NOTE   Subjective:   This is a 63 year old lady with hypertension diabetes chronic kidney disease stage III with a baseline serum creatinine 1.5 to 2 mg/dL was admitted with pneumonia and sepsis.  She was a resident of LTC and was transported to [facility] on 05/21/2018 with 1 day history of respiratory distress and placed on BiPAP.  In the emergency department she was found to be in V. tach and was given amiodarone.  Renal function at that time showed a creatinine of 4.31 and a potassium of 7.5.  She was transferred from [facility] for further treatment. She required a CVVHD until 05/21/2018 and is now on intermittent dialysis since 05/23/2018 she is oliguric and remains dialysis dependent she is status post [facility] placement by interventional radiology on 05/31/2018 she is undergone vein mapping and is scheduled for AV fistula placement 06/06/2018.  Her next dialysis treatment is 06/06/2018.  Blood pressure 134/77 pulse 70 temperature 98.6 O2 sats 95% nasal cannula 3 L no urine output last ultrafiltration with dialysis 4 L 06/03/2018  Sodium 133 potassium 4.5 chloride 99 CO2 24 BUN 60 creatinine 4.85 glucose 176 albumin 2.9 phosphorus 3.6 WBC 10.2 hemoglobin 7.7 platelets 283  Cardizem 240 mg daily, hydralazine 25 mg 3 times daily, isosorbide 10 mg 3 times daily, Toprol 100 mg daily, Aranesp 60 mcg weekly, I am 125 mg x 10 doses.  Pulmicort twice daily, DuoNeb twice daily.    Objective:  Vital signs in last 24 hours:  Temp:  [97.8 F (36.6 C)-98.9 F (37.2 C)] 98.6 F (37 C) (03/09 0455) Pulse Rate:  [59-71] 71 (03/09 0824) Resp:  [13-22] 22 (03/09 0824) BP: (94-154)/(52-82) 154/67 (03/09 0824) SpO2:  [93 %-100 %] 99 % (03/09 0824) Weight:  [118.8 kg] 118.8 kg (03/09 0455)  Weight change: 1.179 kg Filed Weights   06/04/18 0412 06/05/18 0334 06/06/18 0455  Weight: 116.2 kg 117.7 kg 118.8 kg    Intake/Output: I/O last 3 completed shifts: In: 2109.7  [P.O.:720; I.V.:1389.7] Out: -    Intake/Output this shift:  Total I/O In: 81 [P.O.:60] Out: -  Nondistressed CVS- RRR no murmurs rubs or gallops RS- CTA no wheezes rales no crackles ABD- BS present soft non-distended EXT-1+ pitting edema tunneled dialysis catheter site clean dry   Basic Metabolic Panel: Recent Labs  Lab 06/02/18 0029 06/03/18 0359 06/04/18 0447 06/05/18 0441 06/06/18 0352  NA 133* 130* 134* 132* 133*  K 3.5 3.3* 4.2 4.5 4.5  CL 96* 95* 99 99 99  CO2 _0 GLUCOSE 118* 187* 152* 205* 176*  BUN 27* 49* 24* 45* 60*  CREATININE 3.02* 4.17* 3.09* 4.28* 4.85*  CALCIUM 7.8* 8.5* 8.9 8.9 9.2  PHOS 3.1 3.1 2.5 2.7 3.6    Liver Function Tests: Recent Labs  Lab 06/02/18 0029 06/03/18 0359 06/04/18 0447 06/05/18 0441 06/06/18 0352  AST 20  --   --   --   --   ALT 9  --   --   --   --   ALKPHOS 49  --   --   --   --   BILITOT 0.4  --   --   --   --   PROT 6.5  --   --   --   --   ALBUMIN 2.3*  2.3* 2.6* 2.8* 2.8* 2.9*   No results for input(s): LIPASE, AMYLASE in the last 168 hours. No results for input(s): AMMONIA in the  last 168 hours.  CBC: Recent Labs  Lab 06/01/18 1226 06/02/18 0029 06/03/18 0359 06/04/18 0447 06/06/18 0352  WBC 6.8 7.4 8.9 9.1 10.2  HGB 9.3* 7.9* 7.8* 8.2* 7.7*  HCT 29.9* 25.6* 25.0* 27.8* 25.5*  MCV 90.9 92.4 92.6 94.2 96.2  PLT 303 259 288 285 283    Cardiac Enzymes: No results for input(s): CKTOTAL, CKMB, CKMBINDEX, TROPONINI in the last 168 hours.  BNP: Invalid input(s): POCBNP  CBG: Recent Labs  Lab 06/05/18 1149 06/05/18 1612 06/05/18 2233 06/06/18 0750 06/06/18 0957  GLUCAP 165* 178* 178* 177* 179*    Microbiology: Results for orders placed or performed during the hospital encounter of 05/21/18  Blood culture (routine x 2)     Status: None   Collection Time: 05/21/18  4:36 PM  Result Value Ref Range Status   Specimen Description BLOOD LEFT HAND  Final   Special Requests   Final     BOTTLES DRAWN AEROBIC AND ANAEROBIC Blood Culture adequate volume   Culture   Final    NO GROWTH 5 DAYS Performed at [facility], [address], [place], [place] [address]    Report Status 05/26/2018 FINAL  Final  Blood culture (routine x 2)     Status: None   Collection Time: 05/21/18  4:36 PM  Result Value Ref Range Status   Specimen Description BLOOD LEFT ARM  Final   Special Requests   Final    BOTTLES DRAWN AEROBIC ONLY Blood Culture adequate volume   Culture   Final    NO GROWTH 5 DAYS Performed at [facility], [address] [place], [place] [address]    Report Status 05/26/2018 FINAL  Final  MRSA PCR Screening     Status: None   Collection Time: 05/21/18 11:32 PM  Result Value Ref Range Status   MRSA by PCR NEGATIVE NEGATIVE Final    Comment:        The GeneXpert MRSA Assay (FDA approved for NASAL specimens only), is one component of a comprehensive MRSA colonization surveillance program. It is not intended to diagnose MRSA infection nor to guide or monitor treatment for MRSA infections. Performed at [facility], [place] [address], [place], [place] [address]   Culture, blood (routine x 2)     Status: None   Collection Time: 05/24/18  1:04 PM  Result Value Ref Range Status   Specimen Description BLOOD RIGHT ANTECUBITAL  Final   Special Requests AEROBIC BOTTLE ONLY Blood Culture adequate volume  Final   Culture   Final    NO GROWTH 5 DAYS Performed at [facility], [place] [address] [place],  [address]    Report Status 05/29/2018 FINAL  Final  Culture, blood (routine x 2)     Status: None   Collection Time: 05/24/18  1:08 PM  Result Value Ref Range Status   Specimen Description BLOOD BLOOD RIGHT HAND  Final   Special Requests AEROBIC BOTTLE ONLY Blood Culture adequate volume  Final   Culture   Final    NO GROWTH 5 DAYS Performed at [facility], [place] [address] [place],  [address]    Report Status 05/29/2018 FINAL  Final   Culture, bal-quantitative     Status: Abnormal   Collection Time: 05/26/18 12:17 PM  Result Value Ref Range Status   Specimen Description BRONCHIAL ALVEOLAR LAVAGE  Final   Special Requests LLL  Final   Gram Stain   Final    ABUNDANT  WBC PRESENT, PREDOMINANTLY PMN NO ORGANISMS SEEN    Culture (A)  Final    30,000 COLONIES/mL Consistent with normal respiratory flora. Performed at [facility], [place] [address], [place], [place] [address]    Report Status 05/28/2018 FINAL  Final    Coagulation Studies: Recent Labs    06/06/18 0352  LABPROT 13.6  INR 1.1    Urinalysis: No results for input(s): COLORURINE, LABSPEC, PHURINE, GLUCOSEU, HGBUR, BILIRUBINUR, KETONESUR, PROTEINUR, UROBILINOGEN, NITRITE, LEUKOCYTESUR in the last 72 hours.  Invalid input(s): APPERANCEUR    Imaging: No results found.   Medications:   . [MAR Hold] sodium chloride 10 mL/hr at 05/30/18 0830  . [MAR Hold] sodium chloride    . [MAR Hold] sodium chloride    . sodium chloride 10 mL/hr at 06/06/18 0955  . [MAR Hold]  ceFAZolin (ANCEF) IV    . [MAR Hold] ferric gluconate (FERRLECIT/NULECIT) IV 125 mg (06/03/18 1028)  . heparin 2,100 Units/hr (06/06/18 0255)   . [MAR Hold] arformoterol  15 mcg Nebulization BID  . [MAR Hold] budesonide (PULMICORT) nebulizer solution  0.5 mg Nebulization BID  . [MAR Hold] Chlorhexidine Gluconate Cloth  6 each Topical Q0600  . [MAR Hold] darbepoetin (ARANESP) injection - DIALYSIS  60 mcg Intravenous Q Wed-HD  . [MAR Hold] diltiazem  240 mg Oral Daily  . [MAR Hold] feeding supplement (NEPRO CARB STEADY)  237 mL Oral BID BM  . [MAR Hold] hydrALAZINE  25 mg Oral Q8H  . [MAR Hold] insulin aspart  0-5 Units Subcutaneous QHS  . [MAR Hold] insulin aspart  0-9 Units Subcutaneous TID WC  . [MAR Hold] ipratropium-albuterol  3 mL Nebulization BID  . [MAR Hold] isosorbide dinitrate  10 mg Oral TID  . [MAR Hold] metoprolol succinate  100 mg Oral Daily  . [MAR Hold]  multivitamin  1 tablet Oral QHS   [MAR Hold] sodium chloride, [MAR Hold] sodium chloride, [MAR Hold] sodium chloride, [MAR Hold] alteplase, [MAR Hold] camphor-menthol, [MAR Hold] diphenhydrAMINE, [MAR Hold] heparin, [MAR Hold] heparin, [MAR Hold] hydrALAZINE  Assessment/ Plan:   Acute kidney injury in the setting of sepsis appears that she has had a prolonged episode of sepsis requiring now intermittent hemodialysis with no recovery remains oliguric and dialysis dependent since 05/21/2018.  Preparations are being made for AV fistula placement.  She is a tunneled dialysis catheter and clip process.  Baseline on serum creatinine appeared to be about 2 mg/dL, the does not appear to be any signs of recovery.  Hypertension/volume continue to remove fluid with dialysis and titrate antihypertensive medications appropriately  Anemia continues on Aranesp and iron supplementation continue to follow  Bones continue to follow calcium phosphorus will check PTH  Diabetes mellitus as per primary team  Post obstructive pneumonia patient requiring supplemental oxygen has completed antibiotics she did develop hypoxic respiratory failure.  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  Infectious disease consulted.  Diastolic congestive heart failure.  Atrial fibrillation with rapid ventricular rate Eliquis to be given on discharge.    LOS: [place] _0 _1 :20 AM

## 2018-06-06 NOTE — Op Note (Signed)
    OPERATIVE NOTE   PROCEDURE: right brachiocephalic arteriovenous fistula placement  PRE-OPERATIVE DIAGNOSIS: ESRD  POST-OPERATIVE DIAGNOSIS: same as above   SURGEON: Marty Heck, MD  ASSISTANT(S): OR staff  ANESTHESIA: MAC  ESTIMATED BLOOD LOSS: <50 cc  FINDING(S): 1.  Cephalic vein: 4-5 mm, acceptable 2.  Brachial artery: 3 mm, atherosclerotic disease evident 3.  Venous outflow: palpable thrill  4.  Radial flow: dopplerable radial signal  SPECIMEN(S):  none  INDICATIONS:   [name] is a 62 y.o. female who presents with ESRD and need for permanent hemodialysis access.  The patient is scheduled for right brachiocephalic arteriovenous fistula placement.  The patient is aware the risks include but are not limited to: bleeding, infection, steal syndrome, nerve damage, ischemic monomelic neuropathy, failure to mature, and need for additional procedures.  The patient is aware of the risks of the procedure and elects to proceed forward.   DESCRIPTION: After full informed written consent was obtained from the patient, the patient was brought back to the operating room and placed supine upon the operating table.  Prior to induction, the patient received IV antibiotics.   After obtaining adequate anesthesia, the patient was then prepped and draped in the standard fashion for a right arm access procedure.  I turned my attention first to identifying the patient's cephalic vein and brachial artery.  Using SonoSite guidance, the location of these vessels were marked out on the skin.      I made a transverse incision at the level of the antecubitum and dissected through the subcutaneous tissue and fascia to gain exposure of the brachial artery.  This was noted to be 3.5 mm in diameter externally.  This was dissected out proximally and distally and controlled with vessel loops .  I then dissected out the cephalic vein.  This was noted to be 4-5 mm in diameter externally.  The  distal segment of the vein was ligated with a  2-0 silk, and the vein was transected.  The proximal segment was interrogated with serial dilators.  The vein accepted up to a 5 mm dilator without any difficulty.  I then instilled the heparinized saline into the vein and clamped it.  At this point, I reset my exposure of the brachial artery and placed the artery under tension proximally and distally.  I made an arteriotomy with a #11 blade, and then I extended the arteriotomy with a Potts scissor.  I injected heparinized saline proximal and distal to this arteriotomy.  The vein was then sewn to the artery in an end-to-side configuration with a running stitch of 6-0 Prolene.  Prior to completing this anastomosis, I allowed the vein and artery to backbleed.  There was no evidence of clot from any vessels.  I completed the anastomosis in the usual fashion and then released all vessel loops and clamps.    There was a palpable thrill in the venous outflow, and there was a dopplerable radial signal.  At this point, I irrigated out the surgical wound.  There was no further active bleeding.  The subcutaneous tissue was reapproximated with a running stitch of 3-0 Vicryl.  The skin was then reapproximated with a running subcuticular stitch of 4-0 Vicryl.  The skin was then cleaned, dried, and reinforced with Dermabond.  The patient tolerated this procedure well.   COMPLICATIONS: None  CONDITION: Stable  Marty Heck, MD [facility] Office: [phone] Pager: [phone]  06/06/2018, 5:03 PM

## 2018-06-06 NOTE — Patient Outreach (Signed)
[facility]) Care Management  06/06/2018  [name] January 30, 1956 [id number]   Medication Adherence call to [name] patient is at a home care facility at this time patient is showing Losartan 100 mg Pharmacy said last time they filled this prescription was on 04/08/18. [name] is showing past due under [place].   [address] Management Direct Dial [phone]  Fax [phone] [email or web address]

## 2018-06-06 NOTE — Progress Notes (Signed)
[facility]  Subjective  - No complaints.   Objective (!) 154/67 71 98.6 F (37 C) (Axillary) 15 100%  Intake/Output Summary (Last 24 hours) at 06/06/2018 1530 Last data filed at 06/06/2018 1500 Gross per 24 hour  Intake 2021.38 ml  Output -  Net 2021.38 ml    Right radial and brachial pulse palpable - right arm restricted RIJ tunneled dialysis catheter  Laboratory Lab Results: Recent Labs    06/04/18 0447 06/06/18 0352  WBC 9.1 10.2  HGB 8.2* 7.7*  HCT 27.8* 25.5*  PLT 285 283   BMET Recent Labs    06/05/18 0441 06/06/18 0352  NA 132* 133*  K 4.5 4.5  CL 99 99  CO2 24 24  GLUCOSE 205* 176*  BUN 45* 60*  CREATININE 4.28* 4.85*  CALCIUM 8.9 9.2    COAG Lab Results  Component Value Date   INR 1.1 06/06/2018   INR 1.1 05/31/2018   INR 1.7 (H) 05/30/2018   No results found for: PTT  Assessment/Planning:  Plan for RUE AVF vs graft placement today.  Will need superficialization in the future likely. Marty Heck 06/06/2018 3:30 PM --

## 2018-06-07 ENCOUNTER — Encounter (HOSPITAL_COMMUNITY): Payer: Self-pay | Admitting: Vascular Surgery

## 2018-06-07 LAB — CBC
HCT: 26.5 % — ABNORMAL LOW (ref 36.0–46.0)
Hemoglobin: 7.9 g/dL — ABNORMAL LOW (ref 12.0–15.0)
MCH: 28.5 pg (ref 26.0–34.0)
MCHC: 29.8 g/dL — ABNORMAL LOW (ref 30.0–36.0)
MCV: 95.7 fL (ref 80.0–100.0)
Platelets: 295 10*3/uL (ref 150–400)
RBC: 2.77 MIL/uL — ABNORMAL LOW (ref 3.87–5.11)
RDW: 15.8 % — ABNORMAL HIGH (ref 11.5–15.5)
WBC: 6.9 10*3/uL (ref 4.0–10.5)
nRBC: 0 % (ref 0.0–0.2)

## 2018-06-07 LAB — RENAL FUNCTION PANEL
Albumin: 3 g/dL — ABNORMAL LOW (ref 3.5–5.0)
Anion gap: 12 (ref 5–15)
BUN: 72 mg/dL — ABNORMAL HIGH (ref 8–23)
CO2: 20 mmol/L — ABNORMAL LOW (ref 22–32)
Calcium: 9.1 mg/dL (ref 8.9–10.3)
Chloride: 96 mmol/L — ABNORMAL LOW (ref 98–111)
Creatinine, Ser: 5.09 mg/dL — ABNORMAL HIGH (ref 0.44–1.00)
GFR calc Af Amer: 10 mL/min — ABNORMAL LOW (ref >60)
GFR calc non Af Amer: 8 mL/min — ABNORMAL LOW (ref >60)
Glucose, Bld: 300 mg/dL — ABNORMAL HIGH (ref 70–99)
Phosphorus: 5.2 mg/dL — ABNORMAL HIGH (ref 2.5–4.6)
Potassium: 6.1 mmol/L — ABNORMAL HIGH (ref 3.5–5.1)
Sodium: 128 mmol/L — ABNORMAL LOW (ref 135–145)

## 2018-06-07 LAB — GLUCOSE, CAPILLARY
Glucose-Capillary: 191 mg/dL — ABNORMAL HIGH (ref 70–99)
Glucose-Capillary: 276 mg/dL — ABNORMAL HIGH (ref 70–99)

## 2018-06-07 MED ORDER — PENTAFLUOROPROP-TETRAFLUOROETH EX AERO: 1.0000 "application " | INHALATION_SPRAY | CUTANEOUS | Status: DC | PRN

## 2018-06-07 MED ORDER — MENTHOL 3 MG MT LOZG
1.0000 | LOZENGE | OROMUCOSAL | Status: DC | PRN
  Filled 2018-06-07: qty 9

## 2018-06-07 MED ORDER — APIXABAN 5 MG PO TABS
5.0000 mg | ORAL_TABLET | Freq: Two times a day (BID) | ORAL | Status: DC
  Administered 2018-06-07 – 2018-06-09 (×5): 5 mg via ORAL
  Filled 2018-06-07 (×6): qty 1

## 2018-06-07 MED ORDER — HEPARIN SODIUM (PORCINE) 1000 UNIT/ML IJ SOLN
INTRAMUSCULAR | Status: AC
  Filled 2018-06-07: qty 1

## 2018-06-07 MED ORDER — MORPHINE SULFATE (PF) 2 MG/ML IV SOLN
2.0000 mg | Freq: Four times a day (QID) | INTRAVENOUS | Status: DC | PRN
  Filled 2018-06-07: qty 1

## 2018-06-07 MED ORDER — LIDOCAINE-PRILOCAINE 2.5-2.5 % EX CREA: 1.0000 "application " | TOPICAL_CREAM | CUTANEOUS | Status: DC | PRN

## 2018-06-07 MED ORDER — SODIUM CHLORIDE 0.9 % IV SOLN: 100.0000 mL | INTRAVENOUS | Status: DC | PRN

## 2018-06-07 MED ORDER — ALTEPLASE 2 MG IJ SOLR: 2.0000 mg | Freq: Once | INTRAMUSCULAR | Status: DC | PRN

## 2018-06-07 MED ORDER — HEPARIN SODIUM (PORCINE) 1000 UNIT/ML DIALYSIS: 1000.0000 [IU] | INTRAMUSCULAR | Status: DC | PRN

## 2018-06-07 MED ORDER — PHENOL 1.4 % MT LIQD
1.0000 | OROMUCOSAL | Status: DC | PRN
  Filled 2018-06-07: qty 177

## 2018-06-07 MED ORDER — LIDOCAINE HCL (PF) 1 % IJ SOLN: 5.0000 mL | INTRAMUSCULAR | Status: DC | PRN

## 2018-06-07 MED ORDER — ACETAMINOPHEN 325 MG PO TABS
650.0000 mg | ORAL_TABLET | Freq: Four times a day (QID) | ORAL | Status: DC | PRN
  Administered 2018-06-07 – 2018-06-08 (×2): 650 mg via ORAL
  Filled 2018-06-07 (×2): qty 2

## 2018-06-07 MED ORDER — DARBEPOETIN ALFA 60 MCG/0.3ML IJ SOSY
60.0000 ug | PREFILLED_SYRINGE | INTRAMUSCULAR | Status: DC
  Administered 2018-06-09: 60 ug via INTRAVENOUS
  Filled 2018-06-07: qty 0.3

## 2018-06-07 NOTE — Progress Notes (Signed)
Occupational Therapy Treatment Patient Details Name: [name] MRN: [id number] DOB: September 10, 1955 Today's Date: 06/07/2018    History of present illness Pt is a 63 y.o. female with recent admission 2/17-2/20/20 for CAP with d/c back to SNF, now readmitted 05/21/18 with increased respiratory distress in the setting of tachycardia, AKI, hyperkalemia; ETT 2/22-2/28. S/p TTE showing new AV vegetation. S/p RIJ HD cath placement 3/3. S/p RUE AV fistula creation 3/9. PMH includes HTN, DM, cognitive delay.    OT comments  Pt progressing toward OT goals this session. Pt reluctant to participate but eventually with encouragement performing 48 ft functional mobility with min guard overall. Pt still appropriate for SNF at d/c. OT will continue to follow.    Follow Up Recommendations  SNF    Equipment Recommendations  None recommended by OT    Recommendations for Other Services PT consult    Precautions / Restrictions Precautions Precautions: Fall Precaution Comments: Watch O2 sats Restrictions Weight Bearing Restrictions: No       Mobility Bed Mobility               General bed mobility comments: Received sitting in recliner  Transfers Overall transfer level: Needs assistance Equipment used: Rolling walker (2 wheeled) Transfers: Sit to/from Stand Sit to Stand: Mod assist         General transfer comment: Pt unable to stand initially with +1 assist, when +2 provided pt able to stand with mod A. Pt reluctant to attempt functional mobility initially but with encouragement, completed 48 ft with min guard using RW    Balance Overall balance assessment: Needs assistance Sitting-balance support: Feet supported Sitting balance-Leahy Scale: Fair     Standing balance support: During functional activity;Bilateral upper extremity supported Standing balance-Leahy Scale: Fair Standing balance comment: Reliant on UE support, able to complete ~48 ft functional mobility with min  guard                           ADL either performed or assessed with clinical judgement   ADL Overall ADL's : Needs assistance/impaired                                     Functional mobility during ADLs: Moderate assistance;Rolling walker                 Cognition Arousal/Alertness: Awake/alert Behavior During Therapy: WFL for tasks assessed/performed Overall Cognitive Status: History of cognitive impairments - at baseline                                 General Comments: Pt resistant to participation, conversing with therapist appropriately. Anxious re mobility         Exercises Exercises: General Lower Extremity General Exercises - Lower Extremity Ankle Circles/Pumps: AROM;Both Quad Sets: Both;Strengthening;Seated      General Comments Pt on 2L [place] initially, SpO2 at 95%. pt complete functional mobility on RA with no significant change in SpO2. Pt with edematous B feet, pitting edema present.  Feet left elevated using recliner and ankle pumps performed to promote fluid return    Pertinent Vitals/ Pain       Pain Assessment: Faces Faces Pain Scale: Hurts little more Pain Location: R elbow Pain Descriptors / Indicators: Aching Pain Intervention(s): Monitored during session  Frequency  Min 2X/week        Progress Toward Goals  OT Goals(current goals can now be found in the care plan section)  Progress towards OT goals: Progressing toward goals  Acute Rehab OT Goals Patient Stated Goal: none stated OT Goal Formulation: With patient Time For Goal Achievement: 06/12/18 Potential to Achieve Goals: Good  Plan Discharge plan remains appropriate    Co-evaluation    PT/OT/SLP Co-Evaluation/Treatment: Yes     OT goals addressed during session: Strengthening/ROM;Proper use of Adaptive equipment and DME      AM-PAC OT "6 Clicks" Daily Activity     Outcome Measure   Help from another person eating meals?:  None Help from another person taking care of personal grooming?: A Little Help from another person toileting, which includes using toliet, bedpan, or urinal?: A Little Help from another person bathing (including washing, rinsing, drying)?: A Lot Help from another person to put on and taking off regular upper body clothing?: A Lot Help from another person to put on and taking off regular lower body clothing?: A Lot 6 Click Score: 16    End of Session Equipment Utilized During Treatment: Gait belt;Rolling walker  OT Visit Diagnosis: Unsteadiness on feet (R26.81)   Activity Tolerance Patient tolerated treatment well   Patient Left in chair;with call bell/phone within reach;with chair alarm set   Nurse Communication Mobility status        Time: 4621-9471 OT Time Calculation (min): 32 min  Charges: OT General Charges $OT Visit: 1 Visit OT Treatments $Therapeutic Activity: 8-22 mins   Curtis Sites OTR/L  06/07/2018, 3:15 PM

## 2018-06-07 NOTE — Progress Notes (Signed)
PT Cancellation Note  Patient Details Name: [name] MRN: [id number] DOB: [birth year]   Cancelled Treatment:    Reason Eval/Treat Not Completed: Patient at procedure or test/unavailable. Will follow-up for PT treatment as schedule permits.  Mabeline Caras, PT, DPT Acute Rehabilitation Services  Pager [phone] Office [place] 06/07/2018, 7:33 AM

## 2018-06-07 NOTE — Anesthesia Postprocedure Evaluation (Signed)
Anesthesia Post Note  Patient: [name]  Procedure(s) Performed: ARTERIOVENOUS (AV) FISTULA CREATION (Right Arm Upper)     Patient location during evaluation: PACU Anesthesia Type: General Level of consciousness: awake and alert Pain management: pain level controlled Vital Signs Assessment: post-procedure vital signs reviewed and stable Respiratory status: spontaneous breathing, nonlabored ventilation, respiratory function stable and patient connected to nasal cannula oxygen Cardiovascular status: blood pressure returned to baseline and stable Postop Assessment: no apparent nausea or vomiting Anesthetic complications: no    Last Vitals:  Vitals:   06/06/18 2122 06/07/18 0353  BP: 129/70 (!) 144/70  Pulse: 65 65  Resp:    Temp: 36.8 C 37 C  SpO2: 98% 99%    Last Pain:  Vitals:   06/07/18 0353  TempSrc: Axillary  PainSc:                  Tiajuana Amass

## 2018-06-07 NOTE — Progress Notes (Signed)
Physical Therapy Treatment Patient Details Name: [name] MRN: [id number] DOB: [birth year] Today's Date: 06/07/2018    History of Present Illness Pt is a 63 y.o. female with recent admission 2/17-2/20/20 for CAP with d/c back to SNF, now readmitted 05/21/18 with increased respiratory distress in the setting of tachycardia, AKI, hyperkalemia; ETT 2/22-2/28. S/p TTE showing new AV vegetation. S/p RIJ HD cath placement 3/3. S/p RUE AV fistula creation 3/9. PMH includes HTN, DM, cognitive delay.    PT Comments    Pt progressing with mobility. Required modA to stand secondary to RUE pain s/p AV fistula placement. Amb 81' with RW and min guard for balance. SpO2 >/90% on RA. Pt limited by generalized weakness, fatigue and fear of falling; at high risk for falls. Continue to recommend SNF-level therapies to maximize functional mobility and independence prior to return home.    Follow Up Recommendations  SNF;Supervision for mobility/OOB     Equipment Recommendations  Rolling walker with 5" wheels;3in1 (PT)    Recommendations for Other Services       Precautions / Restrictions Precautions Precautions: Fall Precaution Comments: Watch O2 sats Restrictions Weight Bearing Restrictions: No    Mobility  Bed Mobility               General bed mobility comments: Received sitting in recliner  Transfers Overall transfer level: Needs assistance Equipment used: Rolling walker (2 wheeled) Transfers: Sit to/from Stand Sit to Stand: Mod assist         General transfer comment: Pt reluctant to stand with +1 assist, limited by RUE pain; when +2 assist provided, pt able to stand with LUE support and modA+2  Ambulation/Gait Ambulation/Gait assistance: Min guard Gait Distance (Feet): 48 Feet Assistive device: Rolling walker (2 wheeled) Gait Pattern/deviations: Step-through pattern;Decreased stride length Gait velocity: Decreased Gait velocity interpretation: <1.8 ft/sec, indicate of  risk for recurrent falls General Gait Details: Agreeable to ambulation with +2 assist for confidence/chair follow; pt ambulating 48' with RW and min guard, did not require chair follow. Further mobility limited by fatigue. SpO2 90-93% on RA   Stairs             Wheelchair Mobility    Modified Rankin (Stroke Patients Only)       Balance Overall balance assessment: Needs assistance Sitting-balance support: Feet supported Sitting balance-Leahy Scale: Fair     Standing balance support: During functional activity;Bilateral upper extremity supported Standing balance-Leahy Scale: Poor Standing balance comment: Reliant on UE support                            Cognition Arousal/Alertness: Awake/alert Behavior During Therapy: WFL for tasks assessed/performed Overall Cognitive Status: History of cognitive impairments - at baseline                                 General Comments: Pt resistant to participation, conversing with therapist appropriately. Anxious regarding mobility       Exercises General Exercises - Lower Extremity Ankle Circles/Pumps: AROM;Both Quad Sets: Both;Strengthening;Seated    General Comments General comments (skin integrity, edema, etc.): Pitting edema in bilateral lower legs and feet; socks removed and BLEs elevated at end of session      Pertinent Vitals/Pain Pain Assessment: Faces Faces Pain Scale: Hurts little more Pain Location: RUE (fistula site) Pain Descriptors / Indicators: Aching Pain Intervention(s): Limited activity within patient's tolerance;Monitored during session  Home Living                      Prior Function            PT Goals (current goals can now be found in the care plan section) Acute Rehab PT Goals Patient Stated Goal: none stated PT Goal Formulation: With patient Time For Goal Achievement: 06/12/18 Potential to Achieve Goals: Good Progress towards PT goals: Progressing toward  goals    Frequency    Min 2X/week      PT Plan Current plan remains appropriate    Co-evaluation PT/OT/SLP Co-Evaluation/Treatment: Yes Reason for Co-Treatment: To address functional/ADL transfers(Pt limited by fatigue, not agreeable to additional session) PT goals addressed during session: Mobility/safety with mobility;Balance;Proper use of DME OT goals addressed during session: Strengthening/ROM;Proper use of Adaptive equipment and DME      AM-PAC PT "6 Clicks" Mobility   Outcome Measure  Help needed turning from your back to your side while in a flat bed without using bedrails?: A Little Help needed moving from lying on your back to sitting on the side of a flat bed without using bedrails?: A Lot Help needed moving to and from a bed to a chair (including a wheelchair)?: A Lot Help needed standing up from a chair using your arms (e.g., wheelchair or bedside chair)?: A Little Help needed to walk in hospital room?: A Little Help needed climbing 3-5 steps with a railing? : A Lot 6 Click Score: 15    End of Session Equipment Utilized During Treatment: Gait belt;Oxygen Activity Tolerance: Patient tolerated treatment well Patient left: in chair;with call bell/phone within reach;with nursing/sitter in room Nurse Communication: Mobility status PT Visit Diagnosis: Unsteadiness on feet (R26.81);Other abnormalities of gait and mobility (R26.89);Muscle weakness (generalized) (M62.81)     Time: 4782-9562 PT Time Calculation (min) (ACUTE ONLY): 14 min  Charges:  $Gait Training: 8-22 mins                    Mabeline Caras, PT, DPT Acute Rehabilitation Services  Pager [phone] Office [place] 06/07/2018, 4:28 PM

## 2018-06-07 NOTE — Social Work (Signed)
Clip in process for outpatient dialysis. Patient will be clipped to [facility] in [place], as the [facility] center has no availability. CSW referred to [facility] yesterday. [facility] followed up today. Patient has tentative chair time of 9 am TTS. Awaiting follow up for available outpatient HD start date.  Patient has bed available at [facility] in [place]. Fortunato Curling has started [facility] authorization, but will need updated PT notes to complete auth. Auth required before patient can admit to the SNF.   CSW to follow.  Estanislado Emms, LCSW [phone]

## 2018-06-07 NOTE — Progress Notes (Signed)
[facility] ROUNDING NOTE   Subjective:   This is a 63 year old lady with hypertension diabetes chronic kidney disease stage III with a baseline serum creatinine 1.5 to 2 mg/dL was admitted with pneumonia and sepsis.  She was a resident of LTC and was transported to [facility] on 05/21/2018 with 1 day history of respiratory distress and placed on BiPAP.  In the emergency department she was found to be in V. tach and was given amiodarone.  Renal function at that time showed a creatinine of 4.31 and a potassium of 7.5.  She was transferred from [facility] for further treatment. She required a CVVHD until 05/21/2018 and is now on intermittent dialysis since 05/23/2018 she is oliguric and remains dialysis dependent she is status post [facility] placement by interventional radiology on 05/31/2018 she is undergone vein mapping and is scheduled for AV fistula placement 06/06/2018.  Dialysis planned 06/07/2018  Blood pressure 138/62 pulse 74 temperature 97.9 O2 sats 98% 2 L nasal cannula  Sodium 128 potassium 6.1 chloride 96 CO2 20 glucose 300 BUN 72 creatinine 5.09 phosphorus 5.2 albumin 3.0 calcium 9.1 WBC 6.9 hemoglobin 7.9 platelets 295  Cardizem 240 mg daily, hydralazine 25 mg 3 times daily, isosorbide 10 mg 3 times daily, Toprol 100 mg daily, Aranesp 60 mcg weekly, I am 125 mg x 10 doses.  Pulmicort twice daily, DuoNeb twice daily.   nebulized Brovana inhaler    Objective:  Vital signs in last 24 hours:  Temp:  [97.7 F (36.5 C)-98.6 F (37 C)] 97.9 F (36.6 C) (03/10 0641) Pulse Rate:  [56-75] 75 (03/10 0900) Resp:  [10-15] 11 (03/10 0641) BP: (100-154)/(47-85) 138/62 (03/10 0900) SpO2:  [95 %-99 %] 98 % (03/10 0641) Weight:  [118.9 kg-119 kg] 119 kg (03/10 0641)  Weight change: 0.091 kg Filed Weights   06/06/18 0455 06/07/18 0353 06/07/18 0641  Weight: 118.8 kg 118.9 kg 119 kg    Intake/Output: I/O last 3 completed shifts: In: 1531.7 [P.O.:600; I.V.:931.7] Out: 75  [Blood:75]   Intake/Output this shift:  No intake/output data recorded. Nondistressed CVS- RRR no murmurs rubs or gallops RS- CTA no wheezes rales no crackles ABD- BS present soft non-distended EXT-1+ pitting edema tunneled dialysis catheter site clean dry   Basic Metabolic Panel: Recent Labs  Lab 06/03/18 0359 06/04/18 0447 06/05/18 0441 06/06/18 0352 06/07/18 0403  NA 130* 134* 132* 133* 128*  K 3.3* 4.2 4.5 4.5 6.1*  CL 95* 99 99 99 96*  CO2 _0 20*  GLUCOSE 187* 152* 205* 176* 300*  BUN 49* 24* 45* 60* 72*  CREATININE 4.17* 3.09* 4.28* 4.85* 5.09*  CALCIUM 8.5* 8.9 8.9 9.2 9.1  PHOS 3.1 2.5 2.7 3.6 5.2*    Liver Function Tests: Recent Labs  Lab 06/02/18 0029 06/03/18 0359 06/04/18 0447 06/05/18 0441 06/06/18 0352 06/07/18 0403  AST 20  --   --   --   --   --   ALT 9  --   --   --   --   --   ALKPHOS 49  --   --   --   --   --   BILITOT 0.4  --   --   --   --   --   PROT 6.5  --   --   --   --   --   ALBUMIN 2.3*  2.3* 2.6* 2.8* 2.8* 2.9* 3.0*   No results for input(s): LIPASE, AMYLASE in the last  168 hours. No results for input(s): AMMONIA in the last 168 hours.  CBC: Recent Labs  Lab 06/02/18 0029 06/03/18 0359 06/04/18 0447 06/06/18 0352 06/07/18 0403  WBC 7.4 8.9 9.1 10.2 6.9  HGB 7.9* 7.8* 8.2* 7.7* 7.9*  HCT 25.6* 25.0* 27.8* 25.5* 26.5*  MCV 92.4 92.6 94.2 96.2 95.7  PLT 259 288 285 283 295    Cardiac Enzymes: No results for input(s): CKTOTAL, CKMB, CKMBINDEX, TROPONINI in the last 168 hours.  BNP: Invalid input(s): POCBNP  CBG: Recent Labs  Lab 06/06/18 0957 06/06/18 1259 06/06/18 1524 06/06/18 1715 06/06/18 2121  GLUCAP 179* 143* 131* 154* 27*    Microbiology: Results for orders placed or performed during the hospital encounter of 05/21/18  Blood culture (routine x 2)     Status: None   Collection Time: 05/21/18  4:36 PM  Result Value Ref Range Status   Specimen Description BLOOD LEFT HAND  Final   Special  Requests   Final    BOTTLES DRAWN AEROBIC AND ANAEROBIC Blood Culture adequate volume   Culture   Final    NO GROWTH 5 DAYS Performed at [facility], [address] [place], [place] [address]    Report Status 05/26/2018 FINAL  Final  Blood culture (routine x 2)     Status: None   Collection Time: 05/21/18  4:36 PM  Result Value Ref Range Status   Specimen Description BLOOD LEFT ARM  Final   Special Requests   Final    BOTTLES DRAWN AEROBIC ONLY Blood Culture adequate volume   Culture   Final    NO GROWTH 5 DAYS Performed at [facility], [address], [place], [place] [address]    Report Status 05/26/2018 FINAL  Final  MRSA PCR Screening     Status: None   Collection Time: 05/21/18 11:32 PM  Result Value Ref Range Status   MRSA by PCR NEGATIVE NEGATIVE Final    Comment:        The GeneXpert MRSA Assay (FDA approved for NASAL specimens only), is one component of a comprehensive MRSA colonization surveillance program. It is not intended to diagnose MRSA infection nor to guide or monitor treatment for MRSA infections. Performed at [facility], [place] [address] [place], [place] [address]   Culture, blood (routine x 2)     Status: None   Collection Time: 05/24/18  1:04 PM  Result Value Ref Range Status   Specimen Description BLOOD RIGHT ANTECUBITAL  Final   Special Requests AEROBIC BOTTLE ONLY Blood Culture adequate volume  Final   Culture   Final    NO GROWTH 5 DAYS Performed at [facility], [place] [address] [place], [place] [address]    Report Status 05/29/2018 FINAL  Final  Culture, blood (routine x 2)     Status: None   Collection Time: 05/24/18  1:08 PM  Result Value Ref Range Status   Specimen Description BLOOD BLOOD RIGHT HAND  Final   Special Requests AEROBIC BOTTLE ONLY Blood Culture adequate volume  Final   Culture   Final    NO GROWTH 5 DAYS Performed at [facility], [place] [address], [place], [place] [address]    Report Status 05/29/2018  FINAL  Final  Culture, bal-quantitative     Status: Abnormal   Collection Time: 05/26/18 12:17 PM  Result Value Ref Range Status   Specimen Description BRONCHIAL ALVEOLAR LAVAGE  Final   Special Requests LLL  Final  Gram Stain   Final    ABUNDANT WBC PRESENT, PREDOMINANTLY PMN NO ORGANISMS SEEN    Culture (A)  Final    30,000 COLONIES/mL Consistent with normal respiratory flora. Performed at [facility], [place] [address], [place], [place] [address]    Report Status 05/28/2018 FINAL  Final    Coagulation Studies: Recent Labs    06/06/18 0352  LABPROT 13.6  INR 1.1    Urinalysis: No results for input(s): COLORURINE, LABSPEC, PHURINE, GLUCOSEU, HGBUR, BILIRUBINUR, KETONESUR, PROTEINUR, UROBILINOGEN, NITRITE, LEUKOCYTESUR in the last 72 hours.  Invalid input(s): APPERANCEUR    Imaging: No results found.   Medications:   . sodium chloride 10 mL/hr at 05/30/18 0830  . sodium chloride    . sodium chloride    . sodium chloride    . sodium chloride    . ferric gluconate (FERRLECIT/NULECIT) IV 125 mg (06/03/18 1028)   . arformoterol  15 mcg Nebulization BID  . budesonide (PULMICORT) nebulizer solution  0.5 mg Nebulization BID  . Chlorhexidine Gluconate Cloth  6 each Topical Q0600  . darbepoetin (ARANESP) injection - DIALYSIS  60 mcg Intravenous Q Wed-HD  . diltiazem  240 mg Oral Daily  . feeding supplement (NEPRO CARB STEADY)  237 mL Oral BID BM  . hydrALAZINE  25 mg Oral Q8H  . insulin aspart  0-5 Units Subcutaneous QHS  . insulin aspart  0-9 Units Subcutaneous TID WC  . ipratropium-albuterol  3 mL Nebulization BID  . isosorbide dinitrate  10 mg Oral TID  . metoprolol succinate  100 mg Oral Daily  . multivitamin  1 tablet Oral QHS   sodium chloride, sodium chloride, sodium chloride, sodium chloride, sodium chloride, acetaminophen, alteplase, alteplase, camphor-menthol, diphenhydrAMINE, heparin, heparin, heparin, hydrALAZINE, lidocaine (PF), lidocaine-prilocaine,  pentafluoroprop-tetrafluoroeth  Assessment/ Plan:   Acute kidney injury in the setting of sepsis appears that she has had a prolonged episode of sepsis requiring now intermittent hemodialysis with no recovery remains oliguric and dialysis dependent since 05/21/2018.  AV fistula placed 06/06/2018 appreciate the assistance of vein and vascular surgery.  She is a tunneled dialysis catheter and clip process.  Baseline on serum creatinine appeared to be about 2 mg/dL, the does not appear to be any signs of recovery.  Patient is receiving dialysis 06/07/2018  Hyperkalemia patient is scheduled for dialysis 06/07/2018  Hypertension/volume continue to remove fluid with dialysis and titrate antihypertensive medications appropriately  Anemia continues on Aranesp and iron supplementation continue to follow  Bones continue to follow calcium phosphorus   PTH 143 05/31/2018  Diabetes mellitus as per primary team  Post obstructive pneumonia patient requiring supplemental oxygen has completed antibiotics she did develop hypoxic respiratory failure.  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  Infectious disease consulted.  Diastolic congestive heart failure.  Atrial fibrillation with rapid ventricular rate Eliquis to be given on discharge.    LOS: [place] _0 _1 :01 AM

## 2018-06-07 NOTE — Progress Notes (Signed)
PROGRESS NOTE    [name]  ZOX:[id number] DOB: 1955-12-26 DOA: 05/21/2018 PCP: Rosita Fire, MD   Brief Narrative:  HPI on 05/21/2018 by [name] 63 year old female presents from [place] to [facility] with reported one day of respiratory distress, placed on BiPAP by EMS. On arrival to ED found to be in [place]. Given Amiodarone bolus Cardiology consulted. Preparing for cardioversion, however patient converted into wide complex tachycardia. Intubated, during intubation had notable emesis. Given Vancomycin/Zosyn. K 7.5, Crt 4.31. Administered Bicarb, Calcium, Albuterol, Insulin/Dextrose. ABG 7.15/45. LA 3.2. Transferred to [facility] for further treatment.   Recent admission 2/14-2/17 with CAP. Noted to have AKI which improved. At discharge Cr 1.63. CT Chest concerning for neoplastic process of left lower lung base.   Interim history Patient was found to have severe dyspnea requiring BiPAP by EMS.  On arrival in the ER, she was found to be in V. tach.  Patient was given an amiodarone bolus and then developed unstable wide-complex tachycardia and acute respiratory failure.  Patient was admitted by [facility] and was in the ICU and intubated.  TRH assumed care. 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 -Oncology consulted and appreciated- will need dotatate PET scan   Acute renal failure on chronic kidney disease, stage III/hyperkalemia/metabolic  acidosis/lactic acidosis -Creatinine baseline 1.3-1.6, now she is dialysis dependent -Nephrology consulted and appreciated, pending further recommendations- transitioning to intermittent hemo -Vascular surgery consulted for [facility] and AVF placement- s/p right brachiocephalic AV fistula placement  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 -1 cm mobile AV vegetation -Blood cultures unremarkable x4 -Infectious disease consulted and appreciated, doubt culture-negative endocarditis but rather that this is either marantic or simply a calcified nodule  Acute on chronic diastolic CHF/hypertension -Patient appears to be volume overloaded, currently dialyzing for volume control -She failed Lasix therapy -BP stable -Continue aspirin, Isordil, metoprolol, diltiazem, hydralazine  Ventricular tachycardia/junctional rhythm in the setting of hyperkalemia -Resolved, continue metoprolol  Atrial fibrillation with RVR, paroxysmal -currently back in SR -Cardiology consulted and appreciated- recommended Eliquis on discharge -Patient seems to have also developed self-limited A. fib post bronchoscopy however overnight patient had repeat episode of atrial fibrillation with heart rate in the 130s -Patient was given diltiazem -Will start Eliquis today  Sleep apnea -Continue BiPAP  Diabetes mellitus, type II -Continue insulin sliding scale with CBG monitoring  History of asthma -Continue budesonide, aformoterol, DuoNebs  Normocytic anemia  -hemoglobin 7.9 -anemia panel: Iron 35, ferritin 266 -continue to monitor CBC  DVT Prophylaxis  Heparin  Code Status: Full  Family Communication: None at bedside  Disposition Plan: Admitted. Dispo TBD- pending [facility] Nephrology Interventional radiology Vascular surgery  Procedures  Intubation/Extubation Bronchoscopy  TEE Right brachiocephalic AV fistula placement  Antibiotics   Anti-infectives (From admission, onward)   Start      Dose/Rate Route Frequency Ordered Stop   06/06/18 0000  ceFAZolin (ANCEF) IVPB 1 g/50 mL premix  Status:  Discontinued    Note to Pharmacy:  Send with pt to OR   1 g 100 mL/hr over 30 Minutes Intravenous On  call 06/05/18 1029 06/05/18 1559   06/06/18 0000  ceFAZolin (ANCEF) IVPB 2g/100 mL premix    Note to Pharmacy:  Send with pt to OR   2 g 200 mL/hr over 30 Minutes Intravenous On call 06/05/18 1559 06/06/18 1615   05/31/18 0730  ceFAZolin (ANCEF) IVPB 2g/100 mL premix     2 g 200 mL/hr over 30 Minutes Intravenous To Radiology 05/31/18 0557 05/31/18 1412   05/30/18 0830  ceFAZolin (ANCEF) IVPB 2g/100 mL premix     2 g 200 mL/hr over 30 Minutes Intravenous To Radiology 05/30/18 0824 05/30/18 1614   05/28/18 1000  amoxicillin-clavulanate (AUGMENTIN) 500-125 MG per tablet 500 mg     1 tablet Oral Daily 05/28/18 0949 05/29/18 0935   05/27/18 1400  Ampicillin-Sulbactam (UNASYN) 3 g in sodium chloride 0.9 % 100 mL IVPB  Status:  Discontinued     3 g 200 mL/hr over 30 Minutes Intravenous Every 12 hours 05/27/18 1039 05/28/18 0949   05/27/18 1200  vancomycin (VANCOCIN) IVPB 1000 mg/200 mL premix  Status:  Discontinued     1,000 mg 200 mL/hr over 60 Minutes Intravenous Every M-W-F (Hemodialysis) 05/25/18 1111 05/26/18 1600   05/25/18 1800  vancomycin (VANCOCIN) IVPB 1000 mg/200 mL premix  Status:  Discontinued     1,000 mg 200 mL/hr over 60 Minutes Intravenous Every M-W-F (Hemodialysis) 05/25/18 1106 05/25/18 1111   05/25/18 1500  vancomycin (VANCOCIN) 1,500 mg in sodium chloride 0.9 % 500 mL IVPB     1,500 mg 250 mL/hr over 120 Minutes Intravenous  Once 05/25/18 1111 05/25/18 1843   05/25/18 1400  Ampicillin-Sulbactam (UNASYN) 3 g in sodium chloride 0.9 % 100 mL IVPB  Status:  Discontinued     3 g 200 mL/hr over 30 Minutes Intravenous Every 12 hours 05/25/18 1111 05/27/18 1039   05/25/18 1200  Ampicillin-Sulbactam (UNASYN) 3 g in sodium chloride 0.9 % 100 mL IVPB  Status:  Discontinued      3 g 200 mL/hr over 30 Minutes Intravenous Every 12 hours 05/25/18 1108 05/25/18 1111   05/25/18 1130  vancomycin (VANCOCIN) IVPB 1000 mg/200 mL premix  Status:  Discontinued     1,000 mg 200 mL/hr over 60 Minutes Intravenous  Once 05/25/18 1106 05/25/18 1111   05/24/18 0600  ceFEPIme (MAXIPIME) 1 g in sodium chloride 0.9 % 100 mL IVPB  Status:  Discontinued     1 g 200 mL/hr over 30 Minutes Intravenous Every 24 hours 05/23/18 0557 05/23/18 1156   05/24/18 0600  ampicillin-sulbactam (UNASYN) 1.5 g in sodium chloride 0.9 % 100 mL IVPB  Status:  Discontinued     1.5 g 200 mL/hr over 30 Minutes Intravenous Every 24 hours 05/23/18 1159 05/23/18 1204   05/23/18 2200  ampicillin-sulbactam (UNASYN) 1.5 g in sodium chloride 0.9 % 100 mL IVPB  Status:  Discontinued     1.5 g 200 mL/hr over 30 Minutes Intravenous Every 24 hours 05/23/18 1204 05/25/18 1108   05/23/18 2000  ampicillin-sulbactam (UNASYN) 1.5 g in sodium chloride 0.9 % 100 mL IVPB  Status:  Discontinued     1.5 g 200 mL/hr over 30 Minutes Intravenous Every 24 hours 05/23/18 1156 05/23/18 1159   05/23/18 0600  ceFEPIme (MAXIPIME) 2 g in sodium chloride 0.9 % 100 mL IVPB     2 g 200 mL/hr over 30 Minutes Intravenous  Once 05/23/18 0548 05/23/18 0702   05/23/18 0600  vancomycin (VANCOCIN) 2,000 mg in sodium chloride 0.9 % 500 mL IVPB  2,000 mg 250 mL/hr over 120 Minutes Intravenous  Once 05/23/18 0548 05/23/18 0933   05/23/18 0559  vancomycin variable dose per unstable renal function (pharmacist dosing)  Status:  Discontinued      Does not apply See admin instructions 05/23/18 0559 05/23/18 1156   05/22/18 2200  vancomycin (VANCOCIN) IVPB 1000 mg/200 mL premix  Status:  Discontinued     1,000 mg 200 mL/hr over 60 Minutes Intravenous Every 24 hours 05/21/18 2047 05/21/18 2206   05/21/18 1730  vancomycin (VANCOCIN) IVPB 1000 mg/200 mL premix  Status:  Discontinued     1,000 mg 200 mL/hr over 60 Minutes Intravenous  Once 05/21/18 1621  05/21/18 2206   05/21/18 1630  vancomycin (VANCOCIN) IVPB 1000 mg/200 mL premix     1,000 mg 200 mL/hr over 60 Minutes Intravenous  Once 05/21/18 1621 05/21/18 2309   05/21/18 1545  ceFEPIme (MAXIPIME) 2 g in sodium chloride 0.9 % 100 mL IVPB     2 g 200 mL/hr over 30 Minutes Intravenous  Once 05/21/18 1543 05/21/18 1745      Subjective:   [name] seen and examined today.  Seen in dialysis.  Patient has no complaints today.  Denies chest pain, shortness of breath, abdominal pain, nausea or vomiting, diarrhea constipation, dizziness or headache. Objective:   Vitals:   06/07/18 0900 06/07/18 0930 06/07/18 1000 06/07/18 1030  BP: 138/62 134/61 136/65 135/65  Pulse: 75 73 75 73  Resp:      Temp:      TempSrc:      SpO2:      Weight:      Height:        Intake/Output Summary (Last 24 hours) at 06/07/2018 1055 Last data filed at 06/06/2018 2130 Gross per 24 hour  Intake 1231.71 ml  Output 75 ml  Net 1156.71 ml   Filed Weights   06/06/18 0455 06/07/18 0353 06/07/18 0641  Weight: 118.8 kg 118.9 kg 119 kg   Exam  General: Well developed, well nourished, NAD, appears stated age  50: NCAT, mucous membranes moist.   Cardiovascular: S1 S2 auscultated, +SEM, RRR  Respiratory: Clear to auscultation bilaterally with equal chest rise  Abdomen: Soft, nontender, nondistended, + bowel sounds  Extremities: warm dry without cyanosis clubbing. LE edema  Neuro: AAOx3, nonfocal  Psych: Pleasant, appropriate mood and affect  Data Reviewed: I have personally reviewed following labs and imaging studies  CBC: Recent Labs  Lab 06/02/18 0029 06/03/18 0359 06/04/18 0447 06/06/18 0352 06/07/18 0403  WBC 7.4 8.9 9.1 10.2 6.9  HGB 7.9* 7.8* 8.2* 7.7* 7.9*  HCT 25.6* 25.0* 27.8* 25.5* 26.5*  MCV 92.4 92.6 94.2 96.2 95.7  PLT 259 288 285 283 875   Basic Metabolic Panel: Recent Labs  Lab 06/03/18 0359 06/04/18 0447 06/05/18 0441 06/06/18 0352 06/07/18 0403  NA 130*  134* 132* 133* 128*  K 3.3* 4.2 4.5 4.5 6.1*  CL 95* 99 99 99 96*  CO2 23 26 24 24  20*  GLUCOSE 187* 152* 205* 176* 300*  BUN 49* 24* 45* 60* 72*  CREATININE 4.17* 3.09* 4.28* 4.85* 5.09*  CALCIUM 8.5* 8.9 8.9 9.2 9.1  PHOS 3.1 2.5 2.7 3.6 5.2*   GFR: Estimated Creatinine Clearance: 14.5 mL/min (A) (by C-G formula based on SCr of 5.09 mg/dL (H)). Liver Function Tests: Recent Labs  Lab 06/02/18 0029 06/03/18 0359 06/04/18 0447 06/05/18 0441 06/06/18 0352 06/07/18 0403  AST 20  --   --   --   --   --  ALT 9  --   --   --   --   --   ALKPHOS 49  --   --   --   --   --   BILITOT 0.4  --   --   --   --   --   PROT 6.5  --   --   --   --   --   ALBUMIN 2.3*  2.3* 2.6* 2.8* 2.8* 2.9* 3.0*   No results for input(s): LIPASE, AMYLASE in the last 168 hours. No results for input(s): AMMONIA in the last 168 hours. Coagulation Profile: Recent Labs  Lab 06/06/18 0352  INR 1.1   Cardiac Enzymes: No results for input(s): CKTOTAL, CKMB, CKMBINDEX, TROPONINI in the last 168 hours. BNP (last 3 results) No results for input(s): PROBNP in the last 8760 hours. HbA1C: No results for input(s): HGBA1C in the last 72 hours. CBG: Recent Labs  Lab 06/06/18 0957 06/06/18 1259 06/06/18 1524 06/06/18 1715 06/06/18 2121  GLUCAP 179* 143* 131* 154* 217*   Lipid Profile: No results for input(s): CHOL, HDL, LDLCALC, TRIG, CHOLHDL, LDLDIRECT in the last 72 hours. Thyroid Function Tests: No results for input(s): TSH, T4TOTAL, FREET4, T3FREE, THYROIDAB in the last 72 hours. Anemia Panel: No results for input(s): VITAMINB12, FOLATE, FERRITIN, TIBC, IRON, RETICCTPCT in the last 72 hours. Urine analysis:    Component Value Date/Time   COLORURINE AMBER (A) 05/22/2018 0758   APPEARANCEUR CLOUDY (A) 05/22/2018 0758   LABSPEC 1.025 05/22/2018 0758   PHURINE 5.0 05/22/2018 0758   GLUCOSEU 50 (A) 05/22/2018 0758   HGBUR LARGE (A) 05/22/2018 0758   BILIRUBINUR NEGATIVE 05/22/2018 0758    KETONESUR 5 (A) 05/22/2018 0758   PROTEINUR 100 (A) 05/22/2018 0758   UROBILINOGEN 0.2 02/01/2013 1145   NITRITE NEGATIVE 05/22/2018 0758   LEUKOCYTESUR SMALL (A) 05/22/2018 0758   Sepsis Labs: @LABRCNTIP (procalcitonin:4,lacticidven:4)  ) No results found for this or any previous visit (from the past 240 hour(s)).    Radiology Studies: No results found.   Scheduled Meds: . apixaban  5 mg Oral BID  . arformoterol  15 mcg Nebulization BID  . budesonide (PULMICORT) nebulizer solution  0.5 mg Nebulization BID  . Chlorhexidine Gluconate Cloth  6 each Topical Q0600  . [START ON 06/09/2018] darbepoetin (ARANESP) injection - DIALYSIS  60 mcg Intravenous Q Thu-HD  . diltiazem  240 mg Oral Daily  . feeding supplement (NEPRO CARB STEADY)  237 mL Oral BID BM  . heparin      . hydrALAZINE  25 mg Oral Q8H  . insulin aspart  0-5 Units Subcutaneous QHS  . insulin aspart  0-9 Units Subcutaneous TID WC  . ipratropium-albuterol  3 mL Nebulization BID  . isosorbide dinitrate  10 mg Oral TID  . metoprolol succinate  100 mg Oral Daily  . multivitamin  1 tablet Oral QHS   Continuous Infusions: . sodium chloride 10 mL/hr at 05/30/18 0830  . sodium chloride    . sodium chloride    . sodium chloride    . sodium chloride    . ferric gluconate (FERRLECIT/NULECIT) IV 125 mg (06/07/18 1037)     LOS: 17 days   Time Spent in minutes   20 minutes  Caci Orren D.O. on 06/07/2018 at 10:55 AM  Between 7am to 7pm - Please see pager noted on [email or web address]  After 7pm go to [email or web address]  And look for the night coverage person covering for me after hours  [facility]  Group Office  [phone]

## 2018-06-07 NOTE — Progress Notes (Signed)
[facility]  Subjective  - No complaints.  Seen in dialysis.   Objective 138/65 66 97.9 F (36.6 C) (Axillary) 11 98%  Intake/Output Summary (Last 24 hours) at 06/07/2018 0838 Last data filed at 06/06/2018 2130 Gross per 24 hour  Intake 1231.71 ml  Output 75 ml  Net 1156.71 ml    Right brachiocephalic AVF with appreciable thrill Right radial pulse palpable  Laboratory Lab Results: Recent Labs    06/06/18 0352 06/07/18 0403  WBC 10.2 6.9  HGB 7.7* 7.9*  HCT 25.5* 26.5*  PLT 283 295   BMET Recent Labs    06/06/18 0352 06/07/18 0403  NA 133* 128*  K 4.5 6.1*  CL 99 96*  CO2 24 20*  GLUCOSE 176* 300*  BUN 60* 72*  CREATININE 4.85* 5.09*  CALCIUM 9.2 9.1    COAG Lab Results  Component Value Date   INR 1.1 06/06/2018   INR 1.1 05/31/2018   INR 1.7 (H) 05/30/2018   No results found for: PTT  Assessment/Planning: POD #1 s/p right brachiocephalic AVF.  Good thrill on exam.  No hand symptoms.  Will arrange follow-up in one month with fistula duplex - discussed will likely need superficialization.  Call vascular with questions or concerns.  Marty Heck 06/07/2018 8:38 AM --

## 2018-06-07 NOTE — Progress Notes (Signed)
ANTICOAGULATION CONSULT NOTE - Initial Consult  Pharmacy Consult for Apixaban Indication: new onset afib  No Known Allergies  Patient Measurements: Height: 5\' 4"  (162.6 cm) Weight: 262 lb 5.6 oz (119 kg) IBW/kg (Calculated) : 54.7   Vital Signs: Temp: 97.9 F (36.6 C) (03/10 0641) Temp Source: Axillary (03/10 0641) BP: 138/62 (03/10 0900) Pulse Rate: 75 (03/10 0900)  Labs: Recent Labs    06/05/18 0441 06/06/18 0352 06/07/18 0403  HGB  --  7.7* 7.9*  HCT  --  25.5* 26.5*  PLT  --  283 295  LABPROT  --  13.6  --   INR  --  1.1  --   HEPARINUNFRC 0.44 0.56  --   CREATININE 4.28* 4.85* 5.09*    Estimated Creatinine Clearance: 14.5 mL/min (A) (by C-G formula based on SCr of 5.09 mg/dL (H)).   Medical History: Past Medical History:  Diagnosis Date  . Arthritis   . Asthma   . Chronic diastolic CHF (congestive heart failure) ([place])   . CKD (chronic kidney disease), stage III ([place])   . Diabetes mellitus type 2 in obese ([place])   . Hypertension   . Iron deficiency anemia 10/16/2010  . Mental handicap 10/16/2010  . Mild CAD 2013  . Morbid obesity ([place])   . OSA (obstructive sleep apnea)    Assessment: 63 y.o. female with newly recognized atrial fibrillation started on heparin drip per pharmacy. PMH includes HTN, DM, vascular disease. Plans for Eliquis initiation. Ok given to initiate apixaban. Patient <80 yo, wt >60 KG, but Scr >1.5. Will start at full dose for afib.     Goal of Therapy:  Monitor platelets by anticoagulation protocol: Yes   Plan:  Apixaban 5mg  PO BID Monitor for bleeding  Ozan Maclay A. Levada Dy, PharmD, [place] Please utilize Amion for appropriate phone number to reach the unit pharmacist ([place])   06/07/2018,10:17 AM

## 2018-06-08 LAB — GLUCOSE, CAPILLARY
Glucose-Capillary: 275 mg/dL — ABNORMAL HIGH (ref 70–99)
Glucose-Capillary: 197 mg/dL — ABNORMAL HIGH (ref 70–99)
Glucose-Capillary: 302 mg/dL — ABNORMAL HIGH (ref 70–99)
Glucose-Capillary: 167 mg/dL — ABNORMAL HIGH (ref 70–99)
Glucose-Capillary: 166 mg/dL — ABNORMAL HIGH (ref 70–99)

## 2018-06-08 LAB — CBC
HCT: 24.2 % — ABNORMAL LOW (ref 36.0–46.0)
Hemoglobin: 7.4 g/dL — ABNORMAL LOW (ref 12.0–15.0)
MCH: 29.6 pg (ref 26.0–34.0)
MCHC: 30.6 g/dL (ref 30.0–36.0)
MCV: 96.8 fL (ref 80.0–100.0)
Platelets: 284 10*3/uL (ref 150–400)
RBC: 2.5 MIL/uL — ABNORMAL LOW (ref 3.87–5.11)
RDW: 16.8 % — ABNORMAL HIGH (ref 11.5–15.5)
WBC: 9.7 10*3/uL (ref 4.0–10.5)
nRBC: 0.2 % (ref 0.0–0.2)

## 2018-06-08 LAB — RENAL FUNCTION PANEL
Albumin: 3.1 g/dL — ABNORMAL LOW (ref 3.5–5.0)
Anion gap: 6 (ref 5–15)
BUN: 43 mg/dL — ABNORMAL HIGH (ref 8–23)
CO2: 30 mmol/L (ref 22–32)
Calcium: 8.7 mg/dL — ABNORMAL LOW (ref 8.9–10.3)
Chloride: 95 mmol/L — ABNORMAL LOW (ref 98–111)
Creatinine, Ser: 3.61 mg/dL — ABNORMAL HIGH (ref 0.44–1.00)
GFR calc Af Amer: 15 mL/min — ABNORMAL LOW (ref >60)
GFR calc non Af Amer: 13 mL/min — ABNORMAL LOW (ref >60)
Glucose, Bld: 230 mg/dL — ABNORMAL HIGH (ref 70–99)
Phosphorus: 4.9 mg/dL — ABNORMAL HIGH (ref 2.5–4.6)
Potassium: 4.4 mmol/L (ref 3.5–5.1)
Sodium: 131 mmol/L — ABNORMAL LOW (ref 135–145)

## 2018-06-08 MED ORDER — INSULIN ASPART 100 UNIT/ML ~~LOC~~ SOLN
3.0000 [IU] | Freq: Three times a day (TID) | SUBCUTANEOUS | Status: DC
  Administered 2018-06-08: 3 [IU] via SUBCUTANEOUS

## 2018-06-08 MED ORDER — SODIUM CHLORIDE 0.9 % IV SOLN
125.0000 mg | INTRAVENOUS | Status: DC
  Administered 2018-06-09: 125 mg via INTRAVENOUS
  Filled 2018-06-08 (×2): qty 10

## 2018-06-08 MED ORDER — CHLORHEXIDINE GLUCONATE CLOTH 2 % EX PADS: 6.0000 | MEDICATED_PAD | Freq: Every day | CUTANEOUS | Status: DC

## 2018-06-08 MED ORDER — INSULIN ASPART 100 UNIT/ML ~~LOC~~ SOLN: 0.0000 [IU] | Freq: Every day | SUBCUTANEOUS | Status: DC

## 2018-06-08 MED ORDER — INSULIN ASPART 100 UNIT/ML ~~LOC~~ SOLN
0.0000 [IU] | Freq: Three times a day (TID) | SUBCUTANEOUS | Status: DC
  Administered 2018-06-08: 3 [IU] via SUBCUTANEOUS
  Administered 2018-06-09: 5 [IU] via SUBCUTANEOUS
  Administered 2018-06-09: 2 [IU] via SUBCUTANEOUS

## 2018-06-08 NOTE — Care Management Important Message (Signed)
Important Message  Patient Details  Name: [name] MRN: [id number] Date of Birth: May 04, 1955   Medicare Important Message Given:  Yes    Barb Merino Kasy Iannacone 06/08/2018, 4:57 PM

## 2018-06-08 NOTE — Progress Notes (Signed)
PROGRESS NOTE    [name]  EXB:[id number] DOB: 1956/01/03 DOA: 05/21/2018 PCP: Rosita Fire, MD     Brief Narrative:  [name] is a 63 year old female presents from [place] to [facility] with reported one day of respiratory distress. Patient was found to have severe dyspnea requiring BiPAP by EMS.  On arrival in the ER, she was found to be in V. tach.  Patient was given an amiodarone bolus and then developed unstable wide-complex tachycardia and acute respiratory failure.  Patient was admitted by [facility] and was in the ICU and intubated.  She has since been extubated, transferred to [facility] care.  She is also undergone tunneled dialysis catheter and AV fistula placement for hemodialysis.  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 -1 cm mobile AV vegetation -Blood cultures negative x4 -Infectious disease consulted and appreciated, doubt culture-negative endocarditis but rather that this is either marantic or simply a calcified nodule  Acute on chronic diastolic CHF/hypertension -Patient appears to be volume overloaded,  currently dialyzing for volume control -Continue aspirin, isordil, metoprolol, diltiazem, hydralazine  Ventricular tachycardia/junctional rhythm in the setting of hyperkalemia -Resolved, continue metoprolol  Atrial fibrillation with RVR, paroxysmal -Currently back in SR -Cardiology consulted and appreciated- recommended Eliquis   Sleep apnea -Continue BiPAP  Diabetes mellitus, type II -Continue insulin sliding scale with CBG monitoring  History of asthma -Continue budesonide, aformoterol, DuoNebs   DVT prophylaxis: Eliquis  Code Status: Full code Family Communication: No family at bedside Disposition Plan: SNF placement, insurance authorization pending   Antimicrobials:  Anti-infectives (From admission, onward)   Start     Dose/Rate Route Frequency Ordered Stop   06/06/18 0000  ceFAZolin (ANCEF) IVPB 1 g/50 mL premix  Status:  Discontinued    Note to Pharmacy:  Send with pt to OR   1 g 100 mL/hr over 30 Minutes Intravenous On call 06/05/18 1029 06/05/18 1559   06/06/18 0000  ceFAZolin (ANCEF) IVPB 2g/100 mL premix    Note to Pharmacy:  Send with pt to OR   2 g 200 mL/hr over 30 Minutes Intravenous On call 06/05/18 1559 06/06/18 1615   05/31/18 0730  ceFAZolin (ANCEF) IVPB 2g/100 mL premix     2 g 200 mL/hr over 30 Minutes Intravenous To Radiology 05/31/18 0557 05/31/18 1412   05/30/18 0830  ceFAZolin (ANCEF) IVPB 2g/100 mL premix     2 g 200 mL/hr over 30 Minutes Intravenous To Radiology 05/30/18 0824 05/30/18 1614   05/28/18 1000  amoxicillin-clavulanate (AUGMENTIN) 500-125 MG per tablet 500 mg     1 tablet Oral Daily 05/28/18 0949 05/29/18 0935   05/27/18 1400  Ampicillin-Sulbactam (UNASYN) 3 g in sodium chloride 0.9 % 100 mL IVPB  Status:  Discontinued  3 g 200 mL/hr over 30 Minutes Intravenous Every 12 hours 05/27/18 1039 05/28/18 0949   05/27/18 1200  vancomycin (VANCOCIN) IVPB 1000 mg/200 mL premix  Status:  Discontinued     1,000 mg 200 mL/hr over 60  Minutes Intravenous Every M-W-F (Hemodialysis) 05/25/18 1111 05/26/18 1600   05/25/18 1800  vancomycin (VANCOCIN) IVPB 1000 mg/200 mL premix  Status:  Discontinued     1,000 mg 200 mL/hr over 60 Minutes Intravenous Every M-W-F (Hemodialysis) 05/25/18 1106 05/25/18 1111   05/25/18 1500  vancomycin (VANCOCIN) 1,500 mg in sodium chloride 0.9 % 500 mL IVPB     1,500 mg 250 mL/hr over 120 Minutes Intravenous  Once 05/25/18 1111 05/25/18 1843   05/25/18 1400  Ampicillin-Sulbactam (UNASYN) 3 g in sodium chloride 0.9 % 100 mL IVPB  Status:  Discontinued     3 g 200 mL/hr over 30 Minutes Intravenous Every 12 hours 05/25/18 1111 05/27/18 1039   05/25/18 1200  Ampicillin-Sulbactam (UNASYN) 3 g in sodium chloride 0.9 % 100 mL IVPB  Status:  Discontinued     3 g 200 mL/hr over 30 Minutes Intravenous Every 12 hours 05/25/18 1108 05/25/18 1111   05/25/18 1130  vancomycin (VANCOCIN) IVPB 1000 mg/200 mL premix  Status:  Discontinued     1,000 mg 200 mL/hr over 60 Minutes Intravenous  Once 05/25/18 1106 05/25/18 1111   05/24/18 0600  ceFEPIme (MAXIPIME) 1 g in sodium chloride 0.9 % 100 mL IVPB  Status:  Discontinued     1 g 200 mL/hr over 30 Minutes Intravenous Every 24 hours 05/23/18 0557 05/23/18 1156   05/24/18 0600  ampicillin-sulbactam (UNASYN) 1.5 g in sodium chloride 0.9 % 100 mL IVPB  Status:  Discontinued     1.5 g 200 mL/hr over 30 Minutes Intravenous Every 24 hours 05/23/18 1159 05/23/18 1204   05/23/18 2200  ampicillin-sulbactam (UNASYN) 1.5 g in sodium chloride 0.9 % 100 mL IVPB  Status:  Discontinued     1.5 g 200 mL/hr over 30 Minutes Intravenous Every 24 hours 05/23/18 1204 05/25/18 1108   05/23/18 2000  ampicillin-sulbactam (UNASYN) 1.5 g in sodium chloride 0.9 % 100 mL IVPB  Status:  Discontinued     1.5 g 200 mL/hr over 30 Minutes Intravenous Every 24 hours 05/23/18 1156 05/23/18 1159   05/23/18 0600  ceFEPIme (MAXIPIME) 2 g in sodium chloride 0.9 % 100 mL IVPB     2 g 200 mL/hr over  30 Minutes Intravenous  Once 05/23/18 0548 05/23/18 0702   05/23/18 0600  vancomycin (VANCOCIN) 2,000 mg in sodium chloride 0.9 % 500 mL IVPB     2,000 mg 250 mL/hr over 120 Minutes Intravenous  Once 05/23/18 0548 05/23/18 0933   05/23/18 0559  vancomycin variable dose per unstable renal function (pharmacist dosing)  Status:  Discontinued      Does not apply See admin instructions 05/23/18 0559 05/23/18 1156   05/22/18 2200  vancomycin (VANCOCIN) IVPB 1000 mg/200 mL premix  Status:  Discontinued     1,000 mg 200 mL/hr over 60 Minutes Intravenous Every 24 hours 05/21/18 2047 05/21/18 2206   05/21/18 1730  vancomycin (VANCOCIN) IVPB 1000 mg/200 mL premix  Status:  Discontinued     1,000 mg 200 mL/hr over 60 Minutes Intravenous  Once 05/21/18 1621 05/21/18 2206   05/21/18 1630  vancomycin (VANCOCIN) IVPB 1000 mg/200 mL premix     1,000 mg 200 mL/hr over 60 Minutes Intravenous  Once 05/21/18 1621 05/21/18 2309  05/21/18 1545  ceFEPIme (MAXIPIME) 2 g in sodium chloride 0.9 % 100 mL IVPB     2 g 200 mL/hr over 30 Minutes Intravenous  Once 05/21/18 1543 05/21/18 1745        Objective: Vitals:   06/08/18 0755 06/08/18 0818 06/08/18 0820 06/08/18 0821  BP: (!) 114/54     Pulse: 60     Resp:      Temp: 98.2 F (36.8 C)     TempSrc: Oral     SpO2:  98% 100% 100%  Weight:      Height:        Intake/Output Summary (Last 24 hours) at 06/08/2018 1425 Last data filed at 06/07/2018 2226 Gross per 24 hour  Intake 480 ml  Output --  Net 480 ml   Filed Weights   06/07/18 0641 06/07/18 1055 06/08/18 0500  Weight: 119 kg 117.2 kg 117.9 kg    Examination:  General exam: Appears calm and comfortable  Respiratory system: Clear to auscultation. Respiratory effort normal. Cardiovascular system: S1 & S2 heard, RRR. No JVD, murmurs, rubs, gallops or clicks. No pedal edema. Gastrointestinal system: Abdomen is nondistended, soft and nontender. No organomegaly or masses felt. Normal bowel sounds  heard. Central nervous system: Alert and oriented. No focal neurological deficits. Extremities: Symmetric 5 x 5 power. Skin: No rashes, lesions or ulcers Psychiatry: Judgement and insight appear normal. Mood & affect appropriate.   Data Reviewed: I have personally reviewed following labs and imaging studies  CBC: Recent Labs  Lab 06/03/18 0359 06/04/18 0447 06/06/18 0352 06/07/18 0403 06/08/18 0424  WBC 8.9 9.1 10.2 6.9 9.7  HGB 7.8* 8.2* 7.7* 7.9* 7.4*  HCT 25.0* 27.8* 25.5* 26.5* 24.2*  MCV 92.6 94.2 96.2 95.7 96.8  PLT 288 285 283 295 329   Basic Metabolic Panel: Recent Labs  Lab 06/04/18 0447 06/05/18 0441 06/06/18 0352 06/07/18 0403 06/08/18 0424  NA 134* 132* 133* 128* 131*  K 4.2 4.5 4.5 6.1* 4.4  CL 99 99 99 96* 95*  CO2 26 24 24  20* 30  GLUCOSE 152* 205* 176* 300* 230*  BUN 24* 45* 60* 72* 43*  CREATININE 3.09* 4.28* 4.85* 5.09* 3.61*  CALCIUM 8.9 8.9 9.2 9.1 8.7*  PHOS 2.5 2.7 3.6 5.2* 4.9*   GFR: Estimated Creatinine Clearance: 20.4 mL/min (A) (by C-G formula based on SCr of 3.61 mg/dL (H)). Liver Function Tests: Recent Labs  Lab 06/02/18 0029  06/04/18 0447 06/05/18 0441 06/06/18 0352 06/07/18 0403 06/08/18 0424  AST 20  --   --   --   --   --   --   ALT 9  --   --   --   --   --   --   ALKPHOS 49  --   --   --   --   --   --   BILITOT 0.4  --   --   --   --   --   --   PROT 6.5  --   --   --   --   --   --   ALBUMIN 2.3*   2.3*   < > 2.8* 2.8* 2.9* 3.0* 3.1*   < > = values in this interval not displayed.   No results for input(s): LIPASE, AMYLASE in the last 168 hours. No results for input(s): AMMONIA in the last 168 hours. Coagulation Profile: Recent Labs  Lab 06/06/18 0352  INR 1.1   Cardiac Enzymes: No results for  input(s): CKTOTAL, CKMB, CKMBINDEX, TROPONINI in the last 168 hours. BNP (last 3 results) No results for input(s): PROBNP in the last 8760 hours. HbA1C: No results for input(s): HGBA1C in the last 72  hours. CBG: Recent Labs  Lab 06/07/18 1129 06/07/18 1611 06/07/18 2053 06/08/18 0750 06/08/18 1139  GLUCAP 191* 275* 276* 197* 302*   Lipid Profile: No results for input(s): CHOL, HDL, LDLCALC, TRIG, CHOLHDL, LDLDIRECT in the last 72 hours. Thyroid Function Tests: No results for input(s): TSH, T4TOTAL, FREET4, T3FREE, THYROIDAB in the last 72 hours. Anemia Panel: No results for input(s): VITAMINB12, FOLATE, FERRITIN, TIBC, IRON, RETICCTPCT in the last 72 hours. Sepsis Labs: No results for input(s): PROCALCITON, LATICACIDVEN in the last 168 hours.  No results found for this or any previous visit (from the past 240 hour(s)).     Radiology Studies: No results found.    Scheduled Meds:  apixaban  5 mg Oral BID   arformoterol  15 mcg Nebulization BID   budesonide (PULMICORT) nebulizer solution  0.5 mg Nebulization BID   Chlorhexidine Gluconate Cloth  6 each Topical Q0600   Chlorhexidine Gluconate Cloth  6 each Topical Q0600   [START ON 06/09/2018] darbepoetin (ARANESP) injection - DIALYSIS  60 mcg Intravenous Q Thu-HD   diltiazem  240 mg Oral Daily   feeding supplement (NEPRO CARB STEADY)  237 mL Oral BID BM   hydrALAZINE  25 mg Oral Q8H   insulin aspart  0-5 Units Subcutaneous QHS   insulin aspart  0-9 Units Subcutaneous TID WC   ipratropium-albuterol  3 mL Nebulization BID   isosorbide dinitrate  10 mg Oral TID   metoprolol succinate  100 mg Oral Daily   multivitamin  1 tablet Oral QHS   Continuous Infusions:  sodium chloride 10 mL/hr at 05/30/18 0830   sodium chloride     sodium chloride     [START ON 06/09/2018] ferric gluconate (FERRLECIT/NULECIT) IV       LOS: 18 days    Time spent: 40 minutes   Dessa Phi, DO [facility] [email or web address] 06/08/2018, 2:25 PM

## 2018-06-08 NOTE — Progress Notes (Signed)
Inpatient Diabetes Program Recommendations  AACE/ADA: New Consensus Statement on Inpatient Glycemic Control (2015)  Target Ranges:  Prepandial:   less than 140 mg/dL      Peak postprandial:   less than 180 mg/dL (1-2 hours)      Critically ill patients:  140 - 180 mg/dL    Results for JAYLYNN, MCALEER (MRN [id number]) as of 06/08/2018 14:45  Ref. Range 06/07/2018 11:29 06/07/2018 16:11 06/07/2018 20:53 06/08/2018 07:50 06/08/2018 11:39  Glucose-Capillary Latest Ref Range: 70 - 99 mg/dL 191 (H) 275 (H) 276 (H) 197 (H) 302 (H)   Review of Glycemic Control  Diabetes history: DM 2 Current orders for Inpatient glycemic control: Novolog 0-9 units tid, Novolog 0-5 units qhs  Inpatient Diabetes Program Recommendations:    Glucose trends elevated, consider increasing correction scale to 0-15 units tid. Patient may also benefit from meal coverage as glucose increased from 197 at breakfast to 302 at lunchtime.  Thanks,  Tama Headings RN, MSN, BC-ADM Inpatient Diabetes Coordinator Team Pager [phone] (8a-5p)

## 2018-06-08 NOTE — Progress Notes (Signed)
[facility] ROUNDING NOTE   Subjective:   This is a 63 year old lady with hypertension diabetes chronic kidney disease stage III with a baseline serum creatinine 1.5 to 2 mg/dL was admitted with pneumonia and sepsis.  She was a resident of LTC and was transported to [facility] on 05/21/2018 with 1 day history of respiratory distress and placed on BiPAP.  In the emergency department she was found to be in V. tach and was given amiodarone.  Renal function at that time showed a creatinine of 4.31 and a potassium of 7.5.  She was transferred from [facility] for further treatment. She required a CVVHD until 05/21/2018 and is now on intermittent dialysis since 05/23/2018 she is oliguric and remains dialysis dependent she is status post [facility] placement by interventional radiology on 05/31/2018 she is undergone vein mapping and is scheduled for AV fistula placement 06/06/2018.  Dialysis tolerated 3 L ultrafiltration 06/07/2018  Blood pressure 114/54 pulse 61 temperature 98.2 O2 sats 100% 2 L nasal cannula  Sodium 131 potassium 4.4 chloride 95 CO2 30 BUN 43 creatinine 3.61 glucose 230 calcium 8.7 phosphorus 4.9 albumin 3.1 WBC 9.7 hemoglobin 7.4 platelets 284  Cardizem 240 mg daily, hydralazine 25 mg 3 times daily, isosorbide 10 mg 3 times daily, Toprol 100 mg daily, Aranesp 60 mcg weekly, I am 125 mg x 10 doses.  Pulmicort twice daily, DuoNeb twice daily.   nebulized Brovana inhaler, apixaban 5 mg twice daily    Objective:  Vital signs in last 24 hours:  Temp:  [97.1 F (36.2 C)-98.2 F (36.8 C)] 98.2 F (36.8 C) (03/11 0755) Pulse Rate:  [60-77] 60 (03/11 0755) Resp:  [12-18] 18 (03/10 2115) BP: (114-124)/(46-65) 114/54 (03/11 0755) SpO2:  [95 %-100 %] 100 % (03/11 0821) Weight:  [117.2 kg-117.9 kg] 117.9 kg (03/11 0500)  Weight change: -1.769 kg Filed Weights   06/07/18 0641 06/07/18 1055 06/08/18 0500  Weight: 119 kg 117.2 kg 117.9 kg    Intake/Output: I/O last 3  completed shifts: In: 960 [P.O.:960] Out: 3000 [Other:3000]   Intake/Output this shift:  No intake/output data recorded. Nondistressed CVS- RRR no murmurs rubs or gallops RS- CTA no wheezes rales no crackles ABD- BS present soft non-distended EXT-1+ pitting edema tunneled dialysis catheter site clean dry   Basic Metabolic Panel: Recent Labs  Lab 06/04/18 0447 06/05/18 0441 06/06/18 0352 06/07/18 0403 06/08/18 0424  NA 134* 132* 133* 128* 131*  K 4.2 4.5 4.5 6.1* 4.4  CL 99 99 99 96* 95*  CO2 _0 20* 30  GLUCOSE 152* 205* 176* 300* 230*  BUN 24* 45* 60* 72* 43*  CREATININE 3.09* 4.28* 4.85* 5.09* 3.61*  CALCIUM 8.9 8.9 9.2 9.1 8.7*  PHOS 2.5 2.7 3.6 5.2* 4.9*    Liver Function Tests: Recent Labs  Lab 06/02/18 0029  06/04/18 0447 06/05/18 0441 06/06/18 0352 06/07/18 0403 06/08/18 0424  AST 20  --   --   --   --   --   --   ALT 9  --   --   --   --   --   --   ALKPHOS 49  --   --   --   --   --   --   BILITOT 0.4  --   --   --   --   --   --   PROT 6.5  --   --   --   --   --   --  ALBUMIN 2.3*  2.3*   < > 2.8* 2.8* 2.9* 3.0* 3.1*   < > = values in this interval not displayed.   No results for input(s): LIPASE, AMYLASE in the last 168 hours. No results for input(s): AMMONIA in the last 168 hours.  CBC: Recent Labs  Lab 06/03/18 0359 06/04/18 0447 06/06/18 0352 06/07/18 0403 06/08/18 0424  WBC 8.9 9.1 10.2 6.9 9.7  HGB 7.8* 8.2* 7.7* 7.9* 7.4*  HCT 25.0* 27.8* 25.5* 26.5* 24.2*  MCV 92.6 94.2 96.2 95.7 96.8  PLT 288 285 283 295 284    Cardiac Enzymes: No results for input(s): CKTOTAL, CKMB, CKMBINDEX, TROPONINI in the last 168 hours.  BNP: Invalid input(s): POCBNP  CBG: Recent Labs  Lab 06/06/18 1715 06/06/18 2121 06/07/18 1129 06/07/18 1611 06/07/18 2053  GLUCAP 154* 217* 191* 275* 19*    Microbiology: Results for orders placed or performed during the hospital encounter of 05/21/18  Blood culture (routine x 2)     Status: None    Collection Time: 05/21/18  4:36 PM  Result Value Ref Range Status   Specimen Description BLOOD LEFT HAND  Final   Special Requests   Final    BOTTLES DRAWN AEROBIC AND ANAEROBIC Blood Culture adequate volume   Culture   Final    NO GROWTH 5 DAYS Performed at [facility], [address], [place], [place] [address]    Report Status 05/26/2018 FINAL  Final  Blood culture (routine x 2)     Status: None   Collection Time: 05/21/18  4:36 PM  Result Value Ref Range Status   Specimen Description BLOOD LEFT ARM  Final   Special Requests   Final    BOTTLES DRAWN AEROBIC ONLY Blood Culture adequate volume   Culture   Final    NO GROWTH 5 DAYS Performed at [facility], [address], [place], [place] [address]    Report Status 05/26/2018 FINAL  Final  MRSA PCR Screening     Status: None   Collection Time: 05/21/18 11:32 PM  Result Value Ref Range Status   MRSA by PCR NEGATIVE NEGATIVE Final    Comment:        The GeneXpert MRSA Assay (FDA approved for NASAL specimens only), is one component of a comprehensive MRSA colonization surveillance program. It is not intended to diagnose MRSA infection nor to guide or monitor treatment for MRSA infections. Performed at [facility], [place] [address], [place], [place] [address]   Culture, blood (routine x 2)     Status: None   Collection Time: 05/24/18  1:04 PM  Result Value Ref Range Status   Specimen Description BLOOD RIGHT ANTECUBITAL  Final   Special Requests AEROBIC BOTTLE ONLY Blood Culture adequate volume  Final   Culture   Final    NO GROWTH 5 DAYS Performed at [facility], [place] [address] [place], [place] [address]    Report Status 05/29/2018 FINAL  Final  Culture, blood (routine x 2)     Status: None   Collection Time: 05/24/18  1:08 PM  Result Value Ref Range Status   Specimen Description BLOOD BLOOD RIGHT HAND  Final   Special Requests AEROBIC BOTTLE ONLY Blood Culture adequate volume  Final   Culture   Final     NO GROWTH 5 DAYS Performed at [facility], [place] [address], [place], [place] [address]    Report Status 05/29/2018 FINAL  Final  Culture, bal-quantitative  Status: Abnormal   Collection Time: 05/26/18 12:17 PM  Result Value Ref Range Status   Specimen Description BRONCHIAL ALVEOLAR LAVAGE  Final   Special Requests LLL  Final   Gram Stain   Final    ABUNDANT WBC PRESENT, PREDOMINANTLY PMN NO ORGANISMS SEEN    Culture (A)  Final    30,000 COLONIES/mL Consistent with normal respiratory flora. Performed at [facility], [place] [address], [place], [place] [address]    Report Status 05/28/2018 FINAL  Final    Coagulation Studies: Recent Labs    06/06/18 0352  LABPROT 13.6  INR 1.1    Urinalysis: No results for input(s): COLORURINE, LABSPEC, PHURINE, GLUCOSEU, HGBUR, BILIRUBINUR, KETONESUR, PROTEINUR, UROBILINOGEN, NITRITE, LEUKOCYTESUR in the last 72 hours.  Invalid input(s): APPERANCEUR    Imaging: No results found.   Medications:   . sodium chloride 10 mL/hr at 05/30/18 0830  . sodium chloride    . sodium chloride    . ferric gluconate (FERRLECIT/NULECIT) IV Stopped (06/07/18 1419)   . apixaban  5 mg Oral BID  . arformoterol  15 mcg Nebulization BID  . budesonide (PULMICORT) nebulizer solution  0.5 mg Nebulization BID  . Chlorhexidine Gluconate Cloth  6 each Topical Q0600  . [START ON 06/09/2018] darbepoetin (ARANESP) injection - DIALYSIS  60 mcg Intravenous Q Thu-HD  . diltiazem  240 mg Oral Daily  . feeding supplement (NEPRO CARB STEADY)  237 mL Oral BID BM  . hydrALAZINE  25 mg Oral Q8H  . insulin aspart  0-5 Units Subcutaneous QHS  . insulin aspart  0-9 Units Subcutaneous TID WC  . ipratropium-albuterol  3 mL Nebulization BID  . isosorbide dinitrate  10 mg Oral TID  . metoprolol succinate  100 mg Oral Daily  . multivitamin  1 tablet Oral QHS   sodium chloride, sodium chloride, sodium chloride, acetaminophen, alteplase, camphor-menthol,  diphenhydrAMINE, heparin, heparin, hydrALAZINE, menthol-cetylpyridinium, morphine injection, phenol  Assessment/ Plan:   Acute kidney injury in the setting of sepsis appears that she has had a prolonged episode of sepsis requiring now intermittent hemodialysis with no recovery remains oliguric and dialysis dependent since 05/21/2018.  AV fistula placed 06/06/2018 appreciate the assistance of vein and vascular surgery.  She is a tunneled dialysis catheter and clip process.  Baseline on serum creatinine appeared to be about 2 mg/dL, the does not appear to be any signs of recovery.  Patient is receiving dialysis 06/07/2018  Hyperkalemia patient is scheduled for dialysis 06/07/2018  Hypertension/volume continue to remove fluid with dialysis and titrate antihypertensive medications appropriately  Anemia continues on Aranesp and iron supplementation continue to follow  Bones continue to follow calcium phosphorus   PTH 143 05/31/2018  Diabetes mellitus as per primary team  Post obstructive pneumonia patient requiring supplemental oxygen has completed antibiotics she did develop hypoxic respiratory failure.  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  Infectious disease consulted.  Diastolic congestive heart failure.  Atrial fibrillation with rapid ventricular rate Eliquis tprescribed    LOS: [place] _0 _1 :49 AM

## 2018-06-08 NOTE — Social Work (Addendum)
4:17 pm [facility] requires additional paperwork to be completed by guardian at time of first treatment. They also need outpatient HD orders faxed over. [facility] phone number [phone]. There is not an MD at the clinic to review to the orders this afternoon.   [place] now has [facility] authorization for patient to admit to their facility. However, since outpatient HD orders cannot be reviewed this evening, will plan for patient to dialyze at the hospital on 3/12 and then discharge to the facility. She can start HD outpatient on Sat 3/14.  Updated MD and also updated patient's guardian, [name].  12:40 pm Patient is clipped to [facility] dialysis in [place] for TTS at 9 am. Patient's legal guardian, [name], has signed consent paperwork at the dialysis center. Patient can start treatment on 06/09/18 if discharged from the hospital.  Patient's [facility] authorization is pending with [facility] in [place]. When auth completed, patient can admit to the facility.  CSW to follow.  Estanislado Emms, LCSW [phone]

## 2018-06-08 NOTE — Discharge Instructions (Addendum)
[facility]  Discharge Instructions  AV Fistula or Graft Surgery for Dialysis Access  Please refer to the following instructions for your post-procedure care. Your surgeon or physician assistant will discuss any changes with you.  Activity  You may drive the day following your surgery, if you are comfortable and no longer taking prescription pain medication. Resume full activity as the soreness in your incision resolves.  Bathing/Showering  You may shower after you go home. Keep your incision dry for 48 hours. Do not soak in a bathtub, hot tub, or swim until the incision heals completely. You may not shower if you have a hemodialysis catheter.  Incision Care  Clean your incision with mild soap and water after 48 hours. Pat the area dry with a clean towel. You do not need a bandage unless otherwise instructed. Do not apply any ointments or creams to your incision. You may have skin glue on your incision. Do not peel it off. It will come off on its own in about one week. Your arm may swell a bit after surgery. To reduce swelling use pillows to elevate your arm so it is above your heart. Your doctor will tell you if you need to lightly wrap your arm with an ACE bandage.  Diet  Resume your normal diet. There are not special food restrictions following this procedure. In order to heal from your surgery, it is CRITICAL to get adequate nutrition. Your body requires vitamins, minerals, and protein. Vegetables are the best source of vitamins and minerals. Vegetables also provide the perfect balance of protein. Processed food has little nutritional value, so try to avoid this.  Medications  Resume taking all of your medications. If your incision is causing pain, you may take over-the counter pain relievers such as acetaminophen (Tylenol). If you were prescribed a stronger pain medication, please be aware these medications can cause nausea and constipation. Prevent  nausea by taking the medication with a snack or meal. Avoid constipation by drinking plenty of fluids and eating foods with high amount of fiber, such as fruits, vegetables, and grains.  Do not take Tylenol if you are taking prescription pain medications.  Follow up Your surgeon may want to see you in the office following your access surgery. If so, this will be arranged at the time of your surgery.  Please call us immediately for any of the following conditions:  Increased pain, redness, drainage (pus) from your incision site Fever of 101 degrees or higher Severe or worsening pain at your incision site Hand pain or numbness.  Reduce your risk of vascular disease:  Stop smoking. If you would like help, call [facility] at 1-800-QUIT-NOW 613-636-0723) or [place] at [place] your cholesterol Maintain a desired weight Control your diabetes Keep your blood pressure down  Dialysis  It will take several weeks to several months for your new dialysis access to be ready for use. Your surgeon will determine when it is okay to use it. Your nephrologist will continue to direct your dialysis. You can continue to use your Permcath until your new access is ready for use.   06/07/2018 [name] [id number] March 22, 1956  Surgeon(s): Marty Heck, MD  Procedure(s): ARTERIOVENOUS (AV) FISTULA CREATION  x Do not stick fistula for 12 weeks    If you have any questions, please call the office at [phone].   Information on my medicine - ELIQUIS (apixaban)  Why was Eliquis prescribed for you? Eliquis was  prescribed for you to reduce the risk of a blood clot forming that can cause a stroke if you have a medical condition called atrial fibrillation (a type of irregular heartbeat).  What do You need to know about Eliquis ? Take your Eliquis TWICE DAILY - one tablet in the morning and one tablet in the evening with or without food. If you have difficulty  swallowing the tablet whole please discuss with your pharmacist how to take the medication safely.  Take Eliquis exactly as prescribed by your doctor and DO NOT stop taking Eliquis without talking to the doctor who prescribed the medication.  Stopping may increase your risk of developing a stroke.  Refill your prescription before you run out.  After discharge, you should have regular check-up appointments with your healthcare provider that is prescribing your Eliquis.  In the future your dose may need to be changed if your kidney function or weight changes by a significant amount or as you get older.  What do you do if you miss a dose? If you miss a dose, take it as soon as you remember on the same day and resume taking twice daily.  Do not take more than one dose of ELIQUIS at the same time to make up a missed dose.  Important Safety Information A possible side effect of Eliquis is bleeding. You should call your healthcare provider right away if you experience any of the following: ? Bleeding from an injury or your nose that does not stop. ? Unusual colored urine (red or dark brown) or unusual colored stools (red or black). ? Unusual bruising for unknown reasons. ? A serious fall or if you hit your head (even if there is no bleeding).  Some medicines may interact with Eliquis and might increase your risk of bleeding or clotting while on Eliquis. To help avoid this, consult your healthcare provider or pharmacist prior to using any new prescription or non-prescription medications, including herbals, vitamins, non-steroidal anti-inflammatory drugs (NSAIDs) and supplements.  This website has more information on Eliquis (apixaban): http://www.eliquis.com/eliquis/home

## 2018-06-08 NOTE — NC FL2 (Signed)
[place] LEVEL OF CARE SCREENING TOOL     IDENTIFICATION  Patient Name: [name] Birthdate: 01-Feb-1956 Sex: female Admission Date (Current Location): 05/21/2018  [facility] and [place] Number:  Herbalist and Address:  [facility] [place], [place] [address] [place], [place] [address]      Provider Number: [id number]  Attending Physician Name and Address:  Dessa Phi, DO  Relative Name and Phone Number:  [name], guardian, [phone]    Current Level of Care: Hospital Recommended Level of Care: [place] Prior Approval Number:    Date Approved/Denied:   PASRR Number: [id number] E valid 3/4 - 07/01/18  Discharge Plan: SNF    Current Diagnoses: Patient Active Problem List   Diagnosis Date Noted  . Carcinoid tumor determined by biopsy of lung   . Aortic valve mass   . Respiratory arrest ([place]) 05/21/2018  . Acute renal failure (ARF) ([place]) 05/21/2018  . CAP (community acquired pneumonia) 05/13/2018  . Mixed hyperlipidemia 11/23/2017  . Hypercortisolemia ([place]) 04/27/2016  . Morbid obesity due to excess calories ([place]) 07/02/2015  . Uncontrolled type 2 diabetes mellitus with stage 4 chronic kidney disease ([place]) 02/26/2015  . Acute kidney injury superimposed on chronic kidney disease ([place]) 10/07/2011  . Cutaneous candidiasis 10/21/2010  . Essential hypertension, benign 10/21/2010  . Iron deficiency anemia 10/16/2010  . Mental handicap 10/16/2010    Orientation RESPIRATION BLADDER Height & Weight     Self, Time, Situation, Place  O2(nasal cannula 2L) Continent Weight: 260 lb (117.9 kg) Height:  5\' 4"  (162.6 cm)  BEHAVIORAL SYMPTOMS/MOOD NEUROLOGICAL BOWEL NUTRITION STATUS      Continent Diet(please see DC summary)  AMBULATORY STATUS COMMUNICATION OF NEEDS Skin   Limited Assist Verbally Surgical wounds(closed incision R arm (HD fistula))                       Personal Care Assistance Level of  Assistance  Bathing, Feeding, Dressing Bathing Assistance: Limited assistance Feeding assistance: Independent Dressing Assistance: Limited assistance     Functional Limitations Info  Sight, Hearing, Speech Sight Info: Adequate Hearing Info: Adequate Speech Info: Adequate    SPECIAL CARE FACTORS FREQUENCY  PT (By licensed PT), OT (By licensed OT)     PT Frequency: 5x/week OT Frequency: 5x/week            Contractures Contractures Info: Not present    Additional Factors Info  Code Status, Allergies, Insulin Sliding Scale Code Status Info: Full Allergies Info: No Known Allergies   Insulin Sliding Scale Info: novolog 3x/day with meals and at bedtime       Current Medications (06/08/2018):  This is the current hospital active medication list Current Facility-Administered Medications  Medication Dose Route Frequency Provider Last Rate Last Dose  . 0.9 %  sodium chloride infusion   Intravenous PRN Ulyses Amor, PA-C 10 mL/hr at 05/30/18 0830    . 0.9 %  sodium chloride infusion  100 mL Intravenous PRN Laurence Slate M, PA-C      . 0.9 %  sodium chloride infusion  100 mL Intravenous PRN Laurence Slate M, PA-C      . acetaminophen (TYLENOL) tablet 650 mg  650 mg Oral Q6H PRN Edrick Oh, MD   650 mg at 06/07/18 2114  . alteplase (CATHFLO ACTIVASE) injection 2 mg  2 mg Intracatheter Once PRN Laurence Slate M, PA-C      . apixaban (ELIQUIS) tablet 5 mg  5  mg Oral BID Joselyn Glassman A, RPH   5 mg at 06/08/18 0831  . arformoterol (BROVANA) nebulizer solution 15 mcg  15 mcg Nebulization BID Laurence Slate M, PA-C   15 mcg at 06/08/18 0818  . budesonide (PULMICORT) nebulizer solution 0.5 mg  0.5 mg Nebulization BID Laurence Slate M, PA-C   0.5 mg at 06/08/18 0818  . camphor-menthol (SARNA) lotion   Topical PRN Ulyses Amor, PA-C      . Chlorhexidine Gluconate Cloth 2 % PADS 6 each  6 each Topical Q0600 Ulyses Amor, PA-C   6 each at 06/07/18 0530  . [START ON 06/09/2018]  Darbepoetin Alfa (ARANESP) injection 60 mcg  60 mcg Intravenous Q Thu-HD Edrick Oh, MD      . diltiazem [facility] CD) 24 hr capsule 240 mg  240 mg Oral Daily Laurence Slate M, PA-C   240 mg at 06/08/18 4193  . diphenhydrAMINE (BENADRYL) injection 12.5 mg  12.5 mg Intravenous Q6H PRN Laurence Slate M, PA-C   12.5 mg at 05/24/18 0230  . feeding supplement (NEPRO CARB STEADY) liquid 237 mL  237 mL Oral BID BM Laurence Slate M, PA-C   237 mL at 06/05/18 1438  . ferric gluconate (NULECIT) 125 mg in sodium chloride 0.9 % 100 mL IVPB  125 mg Intravenous Q M,W,F-HD Laurence Slate M, PA-C   Stopped at 06/07/18 1419  . heparin injection 1,000 Units  1,000 Units Dialysis PRN Ulyses Amor, PA-C   3,000 Units at 06/06/18 1619  . heparin injection 1,000 Units  1,000 Units Dialysis PRN Ulyses Amor, PA-C      . hydrALAZINE (APRESOLINE) injection 10 mg  10 mg Intravenous Q4H PRN Laurence Slate M, PA-C   10 mg at 05/31/18 2126  . hydrALAZINE (APRESOLINE) tablet 25 mg  25 mg Oral [address] M, PA-C   25 mg at 06/08/18 0600  . insulin aspart (novoLOG) injection 0-5 Units  0-5 Units Subcutaneous QHS Ulyses Amor, [place]   3 Units at 06/07/18 2115  . insulin aspart (novoLOG) injection 0-9 Units  0-9 Units Subcutaneous TID WC Ulyses Amor, PA-C   2 Units at 06/08/18 [phone]  . ipratropium-albuterol (DUONEB) 0.5-2.5 (3) MG/3ML nebulizer solution 3 mL  3 mL Nebulization BID Laurence Slate M, PA-C   3 mL at 06/08/18 0818  . isosorbide dinitrate (ISORDIL) tablet 10 mg  10 mg Oral TID Ulyses Amor, PA-C   10 mg at 06/08/18 4097  . menthol-cetylpyridinium (CEPACOL) lozenge 3 mg  1 lozenge Oral PRN Cristal Ford, DO      . metoprolol succinate (TOPROL-XL) 24 hr tablet 100 mg  100 mg Oral Daily Laurence Slate M, PA-C   100 mg at 06/08/18 0831  . morphine 2 MG/ML injection 2 mg  2 mg Intravenous Q6H PRN Cristal Ford, DO      . multivitamin (RENA-VIT) tablet 1 tablet  1 tablet Oral QHS Ulyses Amor, PA-C   1  tablet at 06/07/18 2111  . phenol (CHLORASEPTIC) mouth spray 1 spray  1 spray Mouth/Throat PRN Cristal Ford, DO         Discharge Medications: Please see discharge summary for a list of discharge medications.  Relevant Imaging Results:  Relevant Lab Results:   Additional Information SSN: [id number]  Estanislado Emms, LCSW

## 2018-06-08 NOTE — Plan of Care (Signed)

## 2018-06-09 DIAGNOSIS — R279 Unspecified lack of coordination: Secondary | ICD-10-CM | POA: Diagnosis not present

## 2018-06-09 DIAGNOSIS — I5043 Acute on chronic combined systolic (congestive) and diastolic (congestive) heart failure: Secondary | ICD-10-CM | POA: Diagnosis not present

## 2018-06-09 DIAGNOSIS — C7A098 Malignant carcinoid tumors of other sites: Secondary | ICD-10-CM | POA: Diagnosis not present

## 2018-06-09 DIAGNOSIS — J690 Pneumonitis due to inhalation of food and vomit: Secondary | ICD-10-CM | POA: Diagnosis not present

## 2018-06-09 DIAGNOSIS — R5381 Other malaise: Secondary | ICD-10-CM | POA: Diagnosis not present

## 2018-06-09 DIAGNOSIS — R092 Respiratory arrest: Secondary | ICD-10-CM | POA: Diagnosis not present

## 2018-06-09 DIAGNOSIS — E875 Hyperkalemia: Secondary | ICD-10-CM | POA: Diagnosis not present

## 2018-06-09 DIAGNOSIS — I10 Essential (primary) hypertension: Secondary | ICD-10-CM | POA: Diagnosis not present

## 2018-06-09 DIAGNOSIS — G4730 Sleep apnea, unspecified: Secondary | ICD-10-CM | POA: Diagnosis not present

## 2018-06-09 DIAGNOSIS — E119 Type 2 diabetes mellitus without complications: Secondary | ICD-10-CM | POA: Diagnosis not present

## 2018-06-09 DIAGNOSIS — I4891 Unspecified atrial fibrillation: Secondary | ICD-10-CM | POA: Diagnosis not present

## 2018-06-09 DIAGNOSIS — D631 Anemia in chronic kidney disease: Secondary | ICD-10-CM | POA: Diagnosis not present

## 2018-06-09 DIAGNOSIS — E872 Acidosis: Secondary | ICD-10-CM | POA: Diagnosis not present

## 2018-06-09 DIAGNOSIS — Z992 Dependence on renal dialysis: Secondary | ICD-10-CM | POA: Diagnosis not present

## 2018-06-09 DIAGNOSIS — N186 End stage renal disease: Secondary | ICD-10-CM | POA: Diagnosis not present

## 2018-06-09 DIAGNOSIS — I472 Ventricular tachycardia: Secondary | ICD-10-CM | POA: Diagnosis not present

## 2018-06-09 DIAGNOSIS — I5032 Chronic diastolic (congestive) heart failure: Secondary | ICD-10-CM | POA: Diagnosis not present

## 2018-06-09 DIAGNOSIS — I5030 Unspecified diastolic (congestive) heart failure: Secondary | ICD-10-CM | POA: Diagnosis not present

## 2018-06-09 DIAGNOSIS — N183 Chronic kidney disease, stage 3 (moderate): Secondary | ICD-10-CM | POA: Diagnosis not present

## 2018-06-09 DIAGNOSIS — E1122 Type 2 diabetes mellitus with diabetic chronic kidney disease: Secondary | ICD-10-CM | POA: Diagnosis not present

## 2018-06-09 DIAGNOSIS — R1310 Dysphagia, unspecified: Secondary | ICD-10-CM | POA: Diagnosis not present

## 2018-06-09 DIAGNOSIS — D519 Vitamin B12 deficiency anemia, unspecified: Secondary | ICD-10-CM | POA: Diagnosis not present

## 2018-06-09 DIAGNOSIS — I132 Hypertensive heart and chronic kidney disease with heart failure and with stage 5 chronic kidney disease, or end stage renal disease: Secondary | ICD-10-CM | POA: Diagnosis not present

## 2018-06-09 DIAGNOSIS — J45909 Unspecified asthma, uncomplicated: Secondary | ICD-10-CM | POA: Diagnosis not present

## 2018-06-09 DIAGNOSIS — D3A00 Benign carcinoid tumor of unspecified site: Secondary | ICD-10-CM | POA: Diagnosis not present

## 2018-06-09 DIAGNOSIS — Z743 Need for continuous supervision: Secondary | ICD-10-CM | POA: Diagnosis not present

## 2018-06-09 DIAGNOSIS — E8770 Fluid overload, unspecified: Secondary | ICD-10-CM | POA: Diagnosis not present

## 2018-06-09 DIAGNOSIS — N179 Acute kidney failure, unspecified: Secondary | ICD-10-CM | POA: Diagnosis not present

## 2018-06-09 DIAGNOSIS — D649 Anemia, unspecified: Secondary | ICD-10-CM | POA: Diagnosis not present

## 2018-06-09 DIAGNOSIS — M6281 Muscle weakness (generalized): Secondary | ICD-10-CM | POA: Diagnosis not present

## 2018-06-09 DIAGNOSIS — J9601 Acute respiratory failure with hypoxia: Secondary | ICD-10-CM | POA: Diagnosis not present

## 2018-06-09 LAB — RENAL FUNCTION PANEL
Albumin: 3.2 g/dL — ABNORMAL LOW (ref 3.5–5.0)
Albumin: 3.3 g/dL — ABNORMAL LOW (ref 3.5–5.0)
Anion gap: 10 (ref 5–15)
Anion gap: 11 (ref 5–15)
BUN: 61 mg/dL — ABNORMAL HIGH (ref 8–23)
BUN: 61 mg/dL — ABNORMAL HIGH (ref 8–23)
CO2: 26 mmol/L (ref 22–32)
CO2: 24 mmol/L (ref 22–32)
Calcium: 9 mg/dL (ref 8.9–10.3)
Calcium: 8.9 mg/dL (ref 8.9–10.3)
Chloride: 94 mmol/L — ABNORMAL LOW (ref 98–111)
Chloride: 94 mmol/L — ABNORMAL LOW (ref 98–111)
Creatinine, Ser: 4.3 mg/dL — ABNORMAL HIGH (ref 0.44–1.00)
Creatinine, Ser: 4.18 mg/dL — ABNORMAL HIGH (ref 0.44–1.00)
GFR calc Af Amer: 12 mL/min — ABNORMAL LOW (ref >60)
GFR calc Af Amer: 12 mL/min — ABNORMAL LOW (ref >60)
GFR calc non Af Amer: 10 mL/min — ABNORMAL LOW (ref >60)
GFR calc non Af Amer: 11 mL/min — ABNORMAL LOW (ref >60)
Glucose, Bld: 189 mg/dL — ABNORMAL HIGH (ref 70–99)
Glucose, Bld: 197 mg/dL — ABNORMAL HIGH (ref 70–99)
Phosphorus: 6.1 mg/dL — ABNORMAL HIGH (ref 2.5–4.6)
Phosphorus: 5.9 mg/dL — ABNORMAL HIGH (ref 2.5–4.6)
Potassium: 5 mmol/L (ref 3.5–5.1)
Potassium: 4.6 mmol/L (ref 3.5–5.1)
Sodium: 130 mmol/L — ABNORMAL LOW (ref 135–145)
Sodium: 129 mmol/L — ABNORMAL LOW (ref 135–145)

## 2018-06-09 LAB — CBC
HCT: 26.9 % — ABNORMAL LOW (ref 36.0–46.0)
Hemoglobin: 7.8 g/dL — ABNORMAL LOW (ref 12.0–15.0)
MCH: 28.2 pg (ref 26.0–34.0)
MCHC: 29 g/dL — ABNORMAL LOW (ref 30.0–36.0)
MCV: 97.1 fL (ref 80.0–100.0)
Platelets: 293 10*3/uL (ref 150–400)
RBC: 2.77 MIL/uL — ABNORMAL LOW (ref 3.87–5.11)
RDW: 17.3 % — ABNORMAL HIGH (ref 11.5–15.5)
WBC: 8.5 10*3/uL (ref 4.0–10.5)
nRBC: 0 % (ref 0.0–0.2)

## 2018-06-09 LAB — GLUCOSE, CAPILLARY
Glucose-Capillary: 145 mg/dL — ABNORMAL HIGH (ref 70–99)
Glucose-Capillary: 206 mg/dL — ABNORMAL HIGH (ref 70–99)

## 2018-06-09 MED ORDER — SODIUM CHLORIDE 0.9 % IV SOLN: 100.0000 mL | INTRAVENOUS | Status: DC | PRN

## 2018-06-09 MED ORDER — LIDOCAINE-PRILOCAINE 2.5-2.5 % EX CREA: 1.0000 "application " | TOPICAL_CREAM | CUTANEOUS | Status: DC | PRN

## 2018-06-09 MED ORDER — DILTIAZEM HCL ER COATED BEADS 240 MG PO CP24: 240.0000 mg | ORAL_CAPSULE | Freq: Every day | ORAL | 0 refills | Status: AC

## 2018-06-09 MED ORDER — PENTAFLUOROPROP-TETRAFLUOROETH EX AERO: 1.0000 "application " | INHALATION_SPRAY | CUTANEOUS | Status: DC | PRN

## 2018-06-09 MED ORDER — HEPARIN SODIUM (PORCINE) 1000 UNIT/ML IJ SOLN
INTRAMUSCULAR | Status: AC
  Administered 2018-06-09: 1000 [IU] via INTRAVENOUS_CENTRAL
  Filled 2018-06-09: qty 4

## 2018-06-09 MED ORDER — ISOSORBIDE DINITRATE 10 MG PO TABS: 10.0000 mg | ORAL_TABLET | Freq: Three times a day (TID) | ORAL | 0 refills | Status: AC

## 2018-06-09 MED ORDER — HEPARIN SODIUM (PORCINE) 1000 UNIT/ML DIALYSIS: 1000.0000 [IU] | INTRAMUSCULAR | Status: DC | PRN

## 2018-06-09 MED ORDER — LIDOCAINE HCL (PF) 1 % IJ SOLN: 5.0000 mL | INTRAMUSCULAR | Status: DC | PRN

## 2018-06-09 MED ORDER — ALTEPLASE 2 MG IJ SOLR: 2.0000 mg | Freq: Once | INTRAMUSCULAR | Status: DC | PRN

## 2018-06-09 MED ORDER — METOPROLOL SUCCINATE ER 100 MG PO TB24: 100.0000 mg | ORAL_TABLET | Freq: Every day | ORAL | 0 refills | Status: DC

## 2018-06-09 MED ORDER — APIXABAN 5 MG PO TABS: 5.0000 mg | ORAL_TABLET | Freq: Two times a day (BID) | ORAL | 0 refills | Status: DC

## 2018-06-09 MED ORDER — HYDRALAZINE HCL 25 MG PO TABS: 25.0000 mg | ORAL_TABLET | Freq: Three times a day (TID) | ORAL | 0 refills | Status: DC

## 2018-06-09 MED ORDER — DARBEPOETIN ALFA 60 MCG/0.3ML IJ SOSY
PREFILLED_SYRINGE | INTRAMUSCULAR | Status: AC
  Administered 2018-06-09: 60 ug via INTRAVENOUS
  Filled 2018-06-09: qty 0.3

## 2018-06-09 NOTE — Progress Notes (Signed)
SW Intern met with patient at bedside to inform her that she will go to [facility] today. Patient called son, Broadus John, on bedside phone and asked intern to tell him the name of the facility, and facility address.  Arlis Porta, Social Work Intern

## 2018-06-09 NOTE — Progress Notes (Signed)
Patient will DC to: [place] Date: 06/09/2018 Family Notified: Ross,Tierra(DSS- Education officer, museum) Transport By:  Therapist, sports, patient, and facility notified of DC. Discharge Summary sent to facility. RN given number for [phone], Room A-5 bed 2 . Ambulance transport requested for patient.   Clinical Social Worker signing off. Thurmond Butts, MSW, [place] Social Worker [phone]

## 2018-06-09 NOTE — Discharge Summary (Addendum)
Physician Discharge Summary  ILEIGH [name] YNW:[id number] DOB: 23-Oct-1955 DOA: 05/21/2018  PCP: Rosita Fire, MD  Admit date: 05/21/2018 Discharge date: 06/09/2018  Disposition:  SNF   Recommendations for Outpatient Follow-up:  1. Follow up with PCP in 1 week 2. Follow up with Dr. Irene Limbo as outpatient, will need staging with outpatient gallium dotatate PET CT scan to eval her carcinoid   3. Follow up with vascular surgery in 1 month 4. Follow up with Cardiology  5. HD TTSa   Discharge Condition: Stable CODE STATUS: Full  Diet recommendation: Renal diet   Brief/Interim Summary: [name] is a 63 year old female presents from LTC to [facility] with reported one day of respiratory distress. Patient was found to have severe dyspnea requiring BiPAP by EMS. On arrival in the ER, she was found to be in V. tach. Patient was given an amiodarone bolus and then developed unstable wide-complex tachycardia and acute respiratory failure. Patient was admitted by [facility] and was in the ICU and intubated.   She was treated for aspiration pneumonitis, acute hypoxemic respiratory failure, wide-complex tachycardia, hyponatremia, hyperkalemia, AKI on CKD stage III.  She was started on CRRT after nephrology consultation. She has since been extubated, transferred to [facility].  She is also undergone tunneled dialysis catheter and AV fistula placement for hemodialysis.   Discharge Diagnoses:  Acute respiratory failure with hypoxia and hypercarbia -Patient initially admitted to ICU -Likely multifactorial including ventricular tachycardia, fluid overload from CHF and renal failure, pneumonia, asthma -Patient required intubation with successfully extubated on 05/27/2018 -Currently on nasal cannula  Postobstructive pneumonia -Completed antibiotics   Carcinoid tumor of the left lower lobe -Oncology consulted and appreciated- will need dotatate PET scan   ESRD  -Nephrology  following -Vascular surgery consulted for [facility] and AVF placement. 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  Given absence of other signs of infectious endocarditis, antibiotics were discontinued to presume culture-negative endocarditis  Acute on chronic diastolic CHF/hypertension -Continue isordil, metoprolol, diltiazem, hydralazine  Ventricular tachycardia/junctional rhythm in the setting of hyperkalemia -Resolved, continue metoprolol  Atrial fibrillation with RVR, paroxysmal -Currently back in SR -Cardiology consulted and appreciated- recommended Eliquis   Sleep apnea -Continue BiPAP qhs   Diabetes mellitus, type II -Continue insulin sliding scale with CBG monitoring  History of asthma -Continue budesonide, aformoterol, DuoNebs  Discharge Instructions  Discharge Instructions    Increase activity slowly   Complete by:  As directed      Allergies as of 06/09/2018   No Known Allergies     Medication List    STOP taking these medications   amoxicillin-clavulanate 875-125 MG tablet Commonly known as:  AUGMENTIN   aspirin EC 81 MG tablet   Fenofibric Acid 135 MG Cpdr   furosemide 40 MG tablet Commonly known as:  LASIX   levofloxacin 500 MG tablet Commonly known as:  Levaquin   losartan 100 MG tablet Commonly known as:  COZAAR   methylPREDNISolone 4 MG Tbpk tablet Commonly known as:  MEDROL DOSEPAK   potassium chloride SA 20 MEQ tablet Commonly known as:  K-DUR,KLOR-CON     TAKE these medications   Accu-Chek Aviva Plus test strip Generic drug:  glucose blood USE TO TEST THREE TIMES DAILY.   Accu-Chek Softclix Lancets lancets USE TO TEST THREE TIMES DAILY.   acetaminophen 500  MG tablet Commonly known as:  TYLENOL Take  500 mg by mouth 2 (two) times daily as needed for mild pain.   albuterol 108 (90 Base) MCG/ACT inhaler Commonly known as:  PROVENTIL HFA;VENTOLIN HFA Inhale 2 puffs into the lungs every 6 (six) hours as needed for shortness of breath.   apixaban 5 MG Tabs tablet Commonly known as:  ELIQUIS Take 1 tablet (5 mg total) by mouth 2 (two) times daily.   diltiazem 240 MG 24 hr capsule Commonly known as:  CARDIZEM CD Take 1 capsule (240 mg total) by mouth daily. Start taking on:  June 10, 2018   First-Dukes Mouthwash Susp Use as directed 5 mLs in the mouth or throat 4 (four) times daily. Swish and spit   fluticasone 27.5 MCG/SPRAY nasal spray Commonly known as:  VERAMYST Place 2 sprays into the nose daily.   guaiFENesin 100 MG/5ML Soln Commonly known as:  ROBITUSSIN Take 5 mLs (100 mg total) by mouth every 4 (four) hours as needed for cough or to loosen phlegm.   hydrALAZINE 25 MG tablet Commonly known as:  APRESOLINE Take 1 tablet (25 mg total) by mouth every 8 (eight) hours for 30 days. What changed:  when to take this   insulin regular human CONCENTRATED 500 UNIT/ML kwikpen Commonly known as:  HumuLIN R U-500 KwikPen Inject 20 Units into the skin 3 (three) times daily with meals. Inject 40 units with breakfast, 40 units with lunch, and 40 units with supper when blood glucose before food is greater than 90 mg/dL What changed:    how much to take  when to take this  additional instructions   ipratropium-albuterol 0.5-2.5 (3) MG/3ML Soln Commonly known as:  DUONEB Take 3 mLs by nebulization every 6 (six) hours as needed (shortness of breath).   isosorbide dinitrate 10 MG tablet Commonly known as:  ISORDIL Take 1 tablet (10 mg total) by mouth 3 (three) times daily. What changed:    medication strength  how much to take   loratadine 10 MG tablet Commonly known as:  CLARITIN Take 10 mg by mouth daily.   metoprolol succinate 100 MG 24 hr tablet Commonly known as:   TOPROL-XL Take 1 tablet (100 mg total) by mouth daily. Take with or immediately following a meal. Start taking on:  June 10, 2018   Sure Comfort Pen Needles 31G X 8 MM Misc Generic drug:  Insulin Pen Needle USE AS DIRECTED WITH LEVEMIR AND NOVOLOG. UP TO FOUR TIMES DAILY.   NovoFine Autocover 30G X 8 MM Misc Generic drug:  Insulin Pen Needle USE AS DIRECTED FOR 3 TIMES DAILY INSULIN ADMINISTRATION.   Trelegy Ellipta 100-62.5-25 MCG/INH Aepb Generic drug:  Fluticasone-Umeclidin-Vilant Inhale into the lungs daily.   triamcinolone ointment 0.5 % Commonly known as:  KENALOG Apply 1 application topically 2 (two) times daily as needed (rash).       Contact information for follow-up providers    [facility] In 6 weeks.   Specialty:  Vascular Surgery Why:  Office will call you to arrange your appt (sent) Contact information: [address] [place] [address] [place], [place], [place]. Schedule an appointment as soon as possible for a visit in 1 week(s).   Specialty:  Internal Medicine Contact information: [place] [address] [phone]        Josue Hector, MD .   Specialty:  Cardiology Contact information: [phone] [address] [facility]  [address] [place] [address] [phone]        Brunetta Genera, MD Follow up.   Specialties:  Hematology, Oncology Contact information: [place] [address] [phone]            Contact information for after-discharge care    [facility] .   Service:  Skilled Nursing Contact information: [address] [place] [phone]                 No Known Allergies   Consultations:  PCCM admission  Nephrology  Infectious disease  Cardiology  IR  Palliative care  Oncology  Vascular surgery   Procedures/Studies:  US  Renal  Result Date: 05/21/2018 CLINICAL DATA:  Acute kidney injury. EXAM: RENAL / URINARY TRACT ULTRASOUND COMPLETE COMPARISON:  11/17/2017 FINDINGS: Right Kidney: Renal measurements: 10.4 x 5.4 x 4.9 cm = volume: 142 mL . Echogenicity within normal limits. No mass or hydronephrosis visualized. Left Kidney: Renal measurements: 11.5 x 6.0 x 5.1 cm = volume: 181 mL. Echogenicity within normal limits. No mass or hydronephrosis visualized. Bladder: Foley catheter in place, decompressed. The previously seen adrenal nodule/adenoma noted, measuring 3.6 x 3.6 x 2.3 cm. IMPRESSION: No acute renal abnormality.  No hydronephrosis. Electronically Signed   By: Rolm Baptise M.D.   On: 05/21/2018 22:20   Ir Fluoro Guide Cv Line Right  Result Date: 05/31/2018 INDICATION: ACUTE RENAL FAILURE, NO CURRENT PERMANENT ACCESS EXAM: ULTRASOUND GUIDANCE FOR VASCULAR ACCESS RIGHT INTERNAL JUGULAR PERMANENT HEMODIALYSIS CATHETER Date:  05/31/2018 05/31/2018 2:24 pm Radiologist:  M. Daryll Brod, MD Guidance:  ULTRASOUND AND FLUOROSCOPIC FLUOROSCOPY TIME:  Fluoroscopy Time: 0 minutes 18 seconds (5 mGy). MEDICATIONS: ANCEF 2 G ADMINISTERED WITHIN 1 HOUR OF THE PROCEDURE ANESTHESIA/SEDATION: Versed 1.0 mg IV; Fentanyl 25 mcg IV; Moderate Sedation Time:  21 minutes The patient was continuously monitored during the procedure by the interventional radiology nurse under my direct supervision. CONTRAST:  None. COMPLICATIONS: None immediate. PROCEDURE: Informed consent was obtained from the patient following explanation of the procedure, risks, benefits and alternatives. The patient understands, agrees and consents for the procedure. All questions were addressed. A time out was performed. Maximal barrier sterile technique utilized including caps, mask, sterile gowns, sterile gloves, large sterile drape, hand hygiene, and 2% chlorhexidine scrub. Under sterile conditions and local anesthesia, right internal jugular micropuncture venous access was  performed with ultrasound. Images were obtained for documentation of the patent right internal jugular vein. A guide wire was inserted followed by a transitional dilator. Next, a 0.035 guidewire was advanced into the IVC with a 5-French catheter. Measurements were obtained from the right venotomy site to the proximal right atrium. In the right infraclavicular chest, a subcutaneous tunnel was created under sterile conditions and local anesthesia. 1% lidocaine with epinephrine was utilized for this. The 19 cm tip to cuff dialysis catheter was tunneled subcutaneously to the venotomy site and inserted into the SVC/RA junction through a valved peel-away sheath. Position was confirmed with fluoroscopy. Images were obtained for documentation. Blood was aspirated from the catheter followed by saline and heparin flushes. The appropriate volume and strength of heparin was instilled in each lumen. Caps were applied. The catheter was secured at the tunnel site with Gelfoam and a pursestring suture. The venotomy site was closed with subcuticular Vicryl suture. Dermabond was applied to the small right neck incision. A dry sterile dressing was applied. The catheter is ready for use. No immediate complications.  IMPRESSION: Ultrasound and fluoroscopically guided right internal jugular tunneled hemodialysis catheter (19 cm tip to cuff dialysis catheter). Electronically Signed   By: Jerilynn Mages.  Shick M.D.   On: 05/31/2018 14:32   Ir US Guide Vasc Access Right  Result Date: 05/31/2018 INDICATION: ACUTE RENAL FAILURE, NO CURRENT PERMANENT ACCESS EXAM: ULTRASOUND GUIDANCE FOR VASCULAR ACCESS RIGHT INTERNAL JUGULAR PERMANENT HEMODIALYSIS CATHETER Date:  05/31/2018 05/31/2018 2:24 pm Radiologist:  M. Daryll Brod, MD Guidance:  ULTRASOUND AND FLUOROSCOPIC FLUOROSCOPY TIME:  Fluoroscopy Time: 0 minutes 18 seconds (5 mGy). MEDICATIONS: ANCEF 2 G ADMINISTERED WITHIN 1 HOUR OF THE PROCEDURE ANESTHESIA/SEDATION: Versed 1.0 mg IV; Fentanyl 25 mcg IV;  Moderate Sedation Time:  21 minutes The patient was continuously monitored during the procedure by the interventional radiology nurse under my direct supervision. CONTRAST:  None. COMPLICATIONS: None immediate. PROCEDURE: Informed consent was obtained from the patient following explanation of the procedure, risks, benefits and alternatives. The patient understands, agrees and consents for the procedure. All questions were addressed. A time out was performed. Maximal barrier sterile technique utilized including caps, mask, sterile gowns, sterile gloves, large sterile drape, hand hygiene, and 2% chlorhexidine scrub. Under sterile conditions and local anesthesia, right internal jugular micropuncture venous access was performed with ultrasound. Images were obtained for documentation of the patent right internal jugular vein. A guide wire was inserted followed by a transitional dilator. Next, a 0.035 guidewire was advanced into the IVC with a 5-French catheter. Measurements were obtained from the right venotomy site to the proximal right atrium. In the right infraclavicular chest, a subcutaneous tunnel was created under sterile conditions and local anesthesia. 1% lidocaine with epinephrine was utilized for this. The 19 cm tip to cuff dialysis catheter was tunneled subcutaneously to the venotomy site and inserted into the SVC/RA junction through a valved peel-away sheath. Position was confirmed with fluoroscopy. Images were obtained for documentation. Blood was aspirated from the catheter followed by saline and heparin flushes. The appropriate volume and strength of heparin was instilled in each lumen. Caps were applied. The catheter was secured at the tunnel site with Gelfoam and a pursestring suture. The venotomy site was closed with subcuticular Vicryl suture. Dermabond was applied to the small right neck incision. A dry sterile dressing was applied. The catheter is ready for use. No immediate complications.  IMPRESSION: Ultrasound and fluoroscopically guided right internal jugular tunneled hemodialysis catheter (19 cm tip to cuff dialysis catheter). Electronically Signed   By: Jerilynn Mages.  Shick M.D.   On: 05/31/2018 14:32   Dg Chest Port 1 View  Result Date: 05/26/2018 CLINICAL DATA:  Post bronchoscopy. EXAM: PORTABLE CHEST 1 VIEW COMPARISON:  05/22/2018. FINDINGS: Right IJ line noted with tip over superior vena cava. NG tube noted with tip below left hemidiaphragm. Cardiomegaly with bilateral pulmonary infiltrates/edema left side greater than right consistent with CHF. Bilateral pneumonia can not be excluded. Findings have progressed from prior exam. Small left pleural effusion. No pneumothorax. IMPRESSION: Lines and tubes in stable position. Congestive heart failure bilateral pulmonary edema left side greater right and left-sided pleural effusion. Findings consistent CHF. Bilateral pneumonia can not be excluded. Findings have progressed from prior exam. Electronically Signed   By: [place]   On: 05/26/2018 14:02   Dg Chest Port 1 View  Result Date: 05/22/2018 CLINICAL DATA:  Respiratory failure EXAM: PORTABLE CHEST 1 VIEW COMPARISON:  1 day prior FINDINGS: Endotracheal tube is unchanged in position. Nasogastric tube extends beyond the inferior aspect of the film. Right internal jugular line unchanged  with tip at mid to low SVC. Cardiomegaly accentuated by AP portable technique. No right-sided pleural effusion. Cannot exclude small left pleural effusion. No pneumothorax. Improved inspiratory effort and aeration. No overt congestive failure. Retrocardiac left lower lobe airspace disease is felt to be slightly improved. IMPRESSION: Improved aeration, with resolved interstitial edema and decreased left base opacity. Suspect residual small left pleural effusion. Appropriate position of support apparatus. Electronically Signed   By: Abigail Miyamoto M.D.   On: 05/22/2018 14:59   Dg Chest Port 1 View  Result Date:  05/21/2018 CLINICAL DATA:  Central line placement. EXAM: PORTABLE CHEST 1 VIEW COMPARISON:  Earlier same day FINDINGS: 2119 hours. Endotracheal tube tip is 3 cm above the base of the carina. The NG tube passes into the stomach although the distal tip position is not included on the film. Right IJ central line tip overlies the proximal SVC. No evidence for pneumothorax or substantial pleural effusion. Lung volumes are low. The cardio pericardial silhouette is enlarged. Persistent retrocardiac left base collapse/consolidation. There is pulmonary vascular congestion without overt pulmonary edema. Telemetry leads overlie the chest. IMPRESSION: 1. Right IJ central line placement with tip overlying the proximal SVC level. No pneumothorax or substantial pleural effusion. 2. Cardiomegaly with vascular congestion and low lung volumes. 3. Similar appearance of the retrocardiac left base collapse/consolidation. Electronically Signed   By: Misty Stanley M.D.   On: 05/21/2018 21:45   Dg Chest Port 1 View  Result Date: 05/21/2018 CLINICAL DATA:  Ventilator dependent. EXAM: PORTABLE CHEST 1 VIEW COMPARISON:  05/21/2018 FINDINGS: Endotracheal tube is 3 cm above the carina. NG tube enters the stomach. Cardiomegaly with vascular congestion. Left lower lobe airspace opacity again noted, unchanged. No focal opacity on the right. No visible significant effusions or acute bony abnormality. IMPRESSION: Left lower lobe airspace opacity, stable. Mild cardiomegaly and vascular congestion. Electronically Signed   By: Rolm Baptise M.D.   On: 05/21/2018 20:38   Dg Chest Portable 1 View  Result Date: 05/21/2018 CLINICAL DATA:  This post intubation EXAM: PORTABLE CHEST 1 VIEW COMPARISON:  05/13/2018 FINDINGS: Endotracheal to is in the right mainstem bronchus. Recommend retracting approximately 2-3 cm. Cardiomegaly. Low lung volumes. Increasing left perihilar and lower lobe airspace opacity which may reflect atelectasis from the right  mainstem intubation. Vascular congestion. No visible effusions or acute bony abnormality. IMPRESSION: Endotracheal tube just into the right mainstem bronchus. Recommend retracting 2-3 cm. Increasing left perihilar and lower lobe opacity could reflect associated atelectasis related to right mainstem intubation. Vascular congestion, low lung volumes. These results were called by telephone at the time of interpretation on 05/21/2018 at 4:08 pm to Dr. Nat Christen , who verbally acknowledged these results. Electronically Signed   By: Rolm Baptise M.D.   On: 05/21/2018 16:08   Vas [place] Upper Ext Vein Mapping (pre-op Avf)  Result Date: 06/02/2018 UPPER EXTREMITY VEIN MAPPING  Indications: Pre-op dialysis access. Comparison Study: No prior study Performing Technologist: Sharion Dove RVS  Examination Guidelines: A complete evaluation includes B-mode imaging, spectral Doppler, color Doppler, and power Doppler as needed of all accessible portions of each vessel. Bilateral testing is considered an integral part of a complete examination. Limited examinations for reoccurring indications may be performed as noted. +-----------------+-------------+----------+---------+ Right Cephalic   Diameter (cm)Depth (cm)Findings  +-----------------+-------------+----------+---------+ Prox upper arm       0.49        1.57             +-----------------+-------------+----------+---------+ Mid upper arm  0.43        1.34             +-----------------+-------------+----------+---------+ Dist upper arm       0.42        0.98   branching +-----------------+-------------+----------+---------+ Antecubital fossa    0.54        0.32             +-----------------+-------------+----------+---------+ Prox forearm         0.41        1.19   branching +-----------------+-------------+----------+---------+ Mid forearm          0.31        0.55             +-----------------+-------------+----------+---------+  Wrist                0.28        0.72   branching +-----------------+-------------+----------+---------+ +--------------+-------------+----------+---------+ Right Basilic Diameter (cm)Depth (cm)Findings  +--------------+-------------+----------+---------+ Dist upper arm                       branching +--------------+-------------+----------+---------+ Prox forearm                         branching +--------------+-------------+----------+---------+ Mid forearm                          branching +--------------+-------------+----------+---------+ +-----------------+-------------+----------+---------+ Left Cephalic    Diameter (cm)Depth (cm)Findings  +-----------------+-------------+----------+---------+ Prox upper arm       0.52        1.29             +-----------------+-------------+----------+---------+ Mid upper arm        0.53        1.87             +-----------------+-------------+----------+---------+ Dist upper arm       0.59        1.36   branching +-----------------+-------------+----------+---------+ Antecubital fossa    0.72        0.96             +-----------------+-------------+----------+---------+ Prox forearm         0.39        1.24             +-----------------+-------------+----------+---------+ Mid forearm          0.46        1.54   branching +-----------------+-------------+----------+---------+ Wrist                0.40        0.81             +-----------------+-------------+----------+---------+ *See table(s) above for measurements and observations.  Diagnosing physician: Servando Snare MD Electronically signed by Servando Snare MD on 06/02/2018 at 5:52:22 PM.    Final      Echocardiogram 05/22/2018 IMPRESSIONS  1. The left ventricle has hyperdynamic systolic function, with an ejection fraction of >65%. The cavity size was normal. Left ventricular diastolic Doppler parameters are consistent with impaired relaxation.  2. The  right ventricle has normal systolic function. The cavity was normal. There is no increase in right ventricular wall thickness.  3. The mitral valve is normal in structure.  4. The tricuspid valve is normal in structure.  5. The aortic valve is tricuspid Mild thickening of the aortic valve Mild calcification of the aortic valve.  6. There is a small mobile density (  1.1 x 0.5cm) on the ventricular surface of the aortic valve. Challenging to visualize. Consider TEE if clinically warranted. 7. The pulmonic valve was normal in structure.    TEE 05/26/2018 IMPRESSIONS  1. Calcified, heterogeneous, primarily immobile lesion on the left coronary cusp, appears to be primarily on the ventricular aspect of the left coronary cusp. In clip 33 where it is well seen it measures 0.98 cm x 0.56 cm. The lesion has the appearance  of a subacute or healed vegetation. Differential also includes calcified nodule in the setting of chronic dialysis.  2. The left ventricle has normal systolic function, with an ejection fraction of 60-65%. The cavity size was normal.  3. The right ventricle has normal systolc function. The cavity was normal. There is no increase in right ventricular wall thickness.  4. The aortic valve is tricuspid. Aortic valve regurgitation is trivial by color flow Doppler.  5. The tricuspid valve was normal in structure.  6. The pulmonic valve was normal in structure.  7. There is evidence of severe atherosclerotic plaque in the aortic arch and descending aorta.  8. Tiny patent foramen ovale with left to right shunting across the atrial septum by color flow Doppler.  9. The interatrial septum appears to be lipomatous. 10. No left atrial appendage thrombus.   Discharge Exam: Vitals:   06/09/18 1154 06/09/18 1420  BP: (!) 164/90 136/69  Pulse: 100 97  Resp: 18   Temp: 98.1 F (36.7 C)   SpO2: 97% 95%    General: Pt is alert, awake, not in acute distress Cardiovascular: RRR, S1/S2 +, no  rubs, no gallops Respiratory: CTA bilaterally, no wheezing, no rhonchi Abdominal: Soft, NT, ND, bowel sounds + Extremities: no edema, no cyanosis    The results of significant diagnostics from this hospitalization (including imaging, microbiology, ancillary and laboratory) are listed below for reference.     Microbiology: No results found for this or any previous visit (from the past 240 hour(s)).   Labs: BNP (last 3 results) Recent Labs    05/21/18 1515  BNP 761.6*   Basic Metabolic Panel: Recent Labs  Lab 06/06/18 0352 06/07/18 0403 06/08/18 0424 06/09/18 0536 06/09/18 0728  NA 133* 128* 131* 130* 129*  K 4.5 6.1* 4.4 5.0 4.6  CL 99 96* 95* 94* 94*  CO2 24 20* 30 26 24   GLUCOSE 176* 300* 230* 189* 197*  BUN 60* 72* 43* 61* 61*  CREATININE 4.85* 5.09* 3.61* 4.30* 4.18*  CALCIUM 9.2 9.1 8.7* 9.0 8.9  PHOS 3.6 5.2* 4.9* 6.1* 5.9*   Liver Function Tests: Recent Labs  Lab 06/06/18 0352 06/07/18 0403 06/08/18 0424 06/09/18 0536 06/09/18 0728  ALBUMIN 2.9* 3.0* 3.1* 3.2* 3.3*   No results for input(s): LIPASE, AMYLASE in the last 168 hours. No results for input(s): AMMONIA in the last 168 hours. CBC: Recent Labs  Lab 06/04/18 0447 06/06/18 0352 06/07/18 0403 06/08/18 0424 06/09/18 0728  WBC 9.1 10.2 6.9 9.7 8.5  HGB 8.2* 7.7* 7.9* 7.4* 7.8*  HCT 27.8* 25.5* 26.5* 24.2* 26.9*  MCV 94.2 96.2 95.7 96.8 97.1  PLT 285 283 295 284 293   Cardiac Enzymes: No results for input(s): CKTOTAL, CKMB, CKMBINDEX, TROPONINI in the last 168 hours. BNP: Invalid input(s): POCBNP CBG: Recent Labs  Lab 06/08/18 0750 06/08/18 1139 06/08/18 1637 06/08/18 2118 06/09/18 1208  GLUCAP 197* 302* 167* 166* 145*   D-Dimer No results for input(s): DDIMER in the last 72 hours. Hgb A1c No results for input(s): HGBA1C in  the last 72 hours. Lipid Profile No results for input(s): CHOL, HDL, LDLCALC, TRIG, CHOLHDL, LDLDIRECT in the last 72 hours. Thyroid function studies No  results for input(s): TSH, T4TOTAL, T3FREE, THYROIDAB in the last 72 hours.  Invalid input(s): FREET3 Anemia work up No results for input(s): VITAMINB12, FOLATE, FERRITIN, TIBC, IRON, RETICCTPCT in the last 72 hours. Urinalysis    Component Value Date/Time   COLORURINE AMBER (A) 05/22/2018 0758   APPEARANCEUR CLOUDY (A) 05/22/2018 0758   LABSPEC 1.025 05/22/2018 0758   PHURINE 5.0 05/22/2018 0758   GLUCOSEU 50 (A) 05/22/2018 0758   HGBUR LARGE (A) 05/22/2018 0758   BILIRUBINUR NEGATIVE 05/22/2018 0758   KETONESUR 5 (A) 05/22/2018 0758   PROTEINUR 100 (A) 05/22/2018 0758   UROBILINOGEN 0.2 02/01/2013 1145   NITRITE NEGATIVE 05/22/2018 0758   LEUKOCYTESUR SMALL (A) 05/22/2018 0758   Sepsis Labs Invalid input(s): PROCALCITONIN,  WBC,  LACTICIDVEN Microbiology No results found for this or any previous visit (from the past 240 hour(s)).   Patient was seen and examined on the day of discharge and was found to be in stable condition. Time coordinating discharge: 40 minutes including assessment and coordination of care, as well as examination of the patient.   SIGNED:  Dessa Phi, DO [facility] [email or web address] 06/09/2018, 3:41 PM

## 2018-06-09 NOTE — Progress Notes (Signed)
Report called to receiving RN at [facility]; all questions answered. Patient awaiting transport via [facility] at this time.

## 2018-06-09 NOTE — Clinical Social Work Placement (Signed)
   CLINICAL SOCIAL WORK PLACEMENT  NOTE  Date:  06/09/2018  Patient Details  Name: [name] MRN: [id number] Date of Birth: [birth year]  Clinical Social Work is seeking post-discharge placement for this patient at the [place] level of care (*CSW will initial, date and re-position this form in  chart as items are completed):  Yes   Patient/family provided with [facility]'s list of facilities offering this level of care within the geographic area requested by the patient (or if unable, by the patient's family).  Yes   Patient/family informed of their freedom to choose among providers that offer the needed level of care, that participate in Medicare, Medicaid or managed care program needed by the patient, have an available bed and are willing to accept the patient.  Yes   Patient/family informed of [facility]'s ownership interest in [facility] and [facility], as well as of the fact that they are under no obligation to receive care at these facilities.  PASRR submitted to EDS on       PASRR number received on 06/08/18     Existing PASRR number confirmed on       FL2 transmitted to all facilities in geographic area requested by pt/family on       FL2 transmitted to all facilities within larger geographic area on       Patient informed that his/her managed care company has contracts with or will negotiate with certain facilities, including the following:        Yes   Patient/family informed of bed offers received.  Patient chooses bed at [facility])     Physician recommends and patient chooses bed at      Patient to be transferred to [facility]) on 06/09/18.  Patient to be transferred to facility by [facility]     Patient family notified on 06/09/18 of transfer.  Name of family member notified:  Ross,Tierra, social worker      PHYSICIAN       Additional Comment:     _______________________________________________ Vinie Sill, [place] 06/09/2018, 4:05 PM

## 2018-06-09 NOTE — Progress Notes (Signed)
[facility] ROUNDING NOTE   Subjective:   This is a 63 year old lady with hypertension diabetes chronic kidney disease stage III with a baseline serum creatinine 1.5 to 2 mg/dL was admitted with pneumonia and sepsis.  She was a resident of LTC and was transported to [facility] on 05/21/2018 with 1 day history of respiratory distress and placed on BiPAP.  In the emergency department she was found to be in V. tach and was given amiodarone.  Renal function at that time showed a creatinine of 4.31 and a potassium of 7.5.  She was transferred from [facility] for further treatment. She required a CVVHD until 05/21/2018 and is now on intermittent dialysis since 05/23/2018 she is oliguric and remains dialysis dependent she is status post [facility] placement by interventional radiology on 05/31/2018 she underwent creation of AV fistula 06/06/2018  Dialysis tolerated 3 L ultrafiltration 06/07/2018   she was seen on dialysis 06/09/2018 and tolerated 4 L ultrafiltration  Blood pressure 160/90 pulse 100 temperature 98.1 O2 sats 97% room 2 L nasal cannula  Sodium 129 potassium 4.6 chloride 94 CO2 24 BUN 61 creatinine 4.18 glucose 197 calcium 8.9 phosphorus 5.9 Albumin 3.3 WBC 8.5 hemoglobin 7.8 platelets 293  Cardizem 240 mg daily, hydralazine 25 mg 3 times daily, isosorbide 10 mg 3 times daily, Toprol 100 mg daily, Aranesp 60 mcg weekly, I am 125 mg x 10 doses.  Pulmicort twice daily, DuoNeb twice daily.   nebulized Brovana inhaler, apixaban 5 mg twice daily    Objective:  Vital signs in last 24 hours:  Temp:  [97.5 F (36.4 C)-98.5 F (36.9 C)] 98.1 F (36.7 C) (03/12 1154) Pulse Rate:  [57-100] 100 (03/12 1154) Resp:  [18] 18 (03/12 1154) BP: (97-164)/(39-90) 164/90 (03/12 1154) SpO2:  [94 %-100 %] 97 % (03/12 1154) Weight:  [361 kg-118.2 kg] 115 kg (03/12 1112)  Weight change: 1.043 kg Filed Weights   06/09/18 0637 06/09/18 0710 06/09/18 1112  Weight: 118.2 kg 118.2 kg 115 kg     Intake/Output: I/O last 3 completed shifts: In: 720 [P.O.:720] Out: -    Intake/Output this shift:  Total I/O In: -  Out: 4000 [Other:4000] Nondistressed CVS- RRR no murmurs rubs or gallops RS- CTA no wheezes rales no crackles ABD- BS present soft non-distended EXT-1+ pitting edema tunneled dialysis catheter site clean dry   Basic Metabolic Panel: Recent Labs  Lab 06/06/18 0352 06/07/18 0403 06/08/18 0424 06/09/18 0536 06/09/18 0728  NA 133* 128* 131* 130* 129*  K 4.5 6.1* 4.4 5.0 4.6  CL 99 96* 95* 94* 94*  CO2 24 20* _0 GLUCOSE 176* 300* 230* 189* 197*  BUN 60* 72* 43* 61* 61*  CREATININE 4.85* 5.09* 3.61* 4.30* 4.18*  CALCIUM 9.2 9.1 8.7* 9.0 8.9  PHOS 3.6 5.2* 4.9* 6.1* 5.9*    Liver Function Tests: Recent Labs  Lab 06/06/18 0352 06/07/18 0403 06/08/18 0424 06/09/18 0536 06/09/18 0728  ALBUMIN 2.9* 3.0* 3.1* 3.2* 3.3*   No results for input(s): LIPASE, AMYLASE in the last 168 hours. No results for input(s): AMMONIA in the last 168 hours.  CBC: Recent Labs  Lab 06/04/18 0447 06/06/18 0352 06/07/18 0403 06/08/18 0424 06/09/18 0728  WBC 9.1 10.2 6.9 9.7 8.5  HGB 8.2* 7.7* 7.9* 7.4* 7.8*  HCT 27.8* 25.5* 26.5* 24.2* 26.9*  MCV 94.2 96.2 95.7 96.8 97.1  PLT 285 283 295 284 293    Cardiac Enzymes: No results for input(s): CKTOTAL, CKMB, CKMBINDEX, TROPONINI  in the last 168 hours.  BNP: Invalid input(s): POCBNP  CBG: Recent Labs  Lab 06/08/18 0750 06/08/18 1139 06/08/18 1637 06/08/18 2118 06/09/18 1208  GLUCAP 197* 302* 167* 166* 145*    Microbiology: Results for orders placed or performed during the hospital encounter of 05/21/18  Blood culture (routine x 2)     Status: None   Collection Time: 05/21/18  4:36 PM  Result Value Ref Range Status   Specimen Description BLOOD LEFT HAND  Final   Special Requests   Final    BOTTLES DRAWN AEROBIC AND ANAEROBIC Blood Culture adequate volume   Culture   Final    NO GROWTH 5  DAYS Performed at [facility], [address], [place], [place] [address]    Report Status 05/26/2018 FINAL  Final  Blood culture (routine x 2)     Status: None   Collection Time: 05/21/18  4:36 PM  Result Value Ref Range Status   Specimen Description BLOOD LEFT ARM  Final   Special Requests   Final    BOTTLES DRAWN AEROBIC ONLY Blood Culture adequate volume   Culture   Final    NO GROWTH 5 DAYS Performed at [facility], [address] [place], [place] [address]    Report Status 05/26/2018 FINAL  Final  MRSA PCR Screening     Status: None   Collection Time: 05/21/18 11:32 PM  Result Value Ref Range Status   MRSA by PCR NEGATIVE NEGATIVE Final    Comment:        The GeneXpert MRSA Assay (FDA approved for NASAL specimens only), is one component of a comprehensive MRSA colonization surveillance program. It is not intended to diagnose MRSA infection nor to guide or monitor treatment for MRSA infections. Performed at [facility], [place] [address], [place], [place] [address]   Culture, blood (routine x 2)     Status: None   Collection Time: 05/24/18  1:04 PM  Result Value Ref Range Status   Specimen Description BLOOD RIGHT ANTECUBITAL  Final   Special Requests AEROBIC BOTTLE ONLY Blood Culture adequate volume  Final   Culture   Final    NO GROWTH 5 DAYS Performed at [facility], [place] [address], [place], [place] [address]    Report Status 05/29/2018 FINAL  Final  Culture, blood (routine x 2)     Status: None   Collection Time: 05/24/18  1:08 PM  Result Value Ref Range Status   Specimen Description BLOOD BLOOD RIGHT HAND  Final   Special Requests AEROBIC BOTTLE ONLY Blood Culture adequate volume  Final   Culture   Final    NO GROWTH 5 DAYS Performed at [facility], [place] [address] [place], [place] [address]    Report Status 05/29/2018 FINAL  Final  Culture, bal-quantitative     Status: Abnormal   Collection Time: 05/26/18 12:17 PM  Result Value Ref  Range Status   Specimen Description BRONCHIAL ALVEOLAR LAVAGE  Final   Special Requests LLL  Final   Gram Stain   Final    ABUNDANT WBC PRESENT, PREDOMINANTLY PMN NO ORGANISMS SEEN    Culture (A)  Final    30,000 COLONIES/mL Consistent with normal respiratory flora. Performed at [facility], [place] [address] [place],  [address]    Report Status 05/28/2018 FINAL  Final    Coagulation Studies: No results for input(s): LABPROT, INR in the last 72 hours.  Urinalysis: No results for  input(s): COLORURINE, LABSPEC, PHURINE, GLUCOSEU, HGBUR, BILIRUBINUR, KETONESUR, PROTEINUR, UROBILINOGEN, NITRITE, LEUKOCYTESUR in the last 72 hours.  Invalid input(s): APPERANCEUR    Imaging: No results found.   Medications:   . sodium chloride 10 mL/hr at 05/30/18 0830  . sodium chloride    . sodium chloride    . ferric gluconate (FERRLECIT/NULECIT) IV Stopped (06/09/18 1202)   . apixaban  5 mg Oral BID  . arformoterol  15 mcg Nebulization BID  . budesonide (PULMICORT) nebulizer solution  0.5 mg Nebulization BID  . Chlorhexidine Gluconate Cloth  6 each Topical Q0600  . Chlorhexidine Gluconate Cloth  6 each Topical Q0600  . darbepoetin (ARANESP) injection - DIALYSIS  60 mcg Intravenous Q Thu-HD  . diltiazem  240 mg Oral Daily  . feeding supplement (NEPRO CARB STEADY)  237 mL Oral BID BM  . hydrALAZINE  25 mg Oral Q8H  . insulin aspart  0-15 Units Subcutaneous TID WC  . insulin aspart  0-5 Units Subcutaneous QHS  . insulin aspart  3 Units Subcutaneous TID WC  . ipratropium-albuterol  3 mL Nebulization BID  . isosorbide dinitrate  10 mg Oral TID  . metoprolol succinate  100 mg Oral Daily  . multivitamin  1 tablet Oral QHS   sodium chloride, sodium chloride, sodium chloride, acetaminophen, alteplase, camphor-menthol, diphenhydrAMINE, heparin, heparin, hydrALAZINE, menthol-cetylpyridinium, morphine injection, phenol  Assessment/ Plan:   Acute kidney injury in the setting of  sepsis appears that she has had a prolonged episode of sepsis requiring now intermittent hemodialysis with no recovery remains oliguric and dialysis dependent since 05/21/2018.  AV fistula placed 06/06/2018 appreciate the assistance of vein and vascular surgery.  She is a tunneled dialysis catheter and clip process.  Baseline on serum creatinine appeared to be about 2 mg/dL, the does not appear to be any signs of recovery.  Patient was seen during the dialysis treatment tolerated 4 L ultrafiltration  Hyperkalemia patient is scheduled for dialysis 06/07/2018  Hypertension/volume continue to remove fluid with dialysis and titrate antihypertensive medications appropriately  Anemia continues on Aranesp and iron supplementation continue to follow  Bones continue to follow calcium phosphorus   PTH 143 05/31/2018  Diabetes mellitus as per primary team  Post obstructive pneumonia patient requiring supplemental oxygen has completed antibiotics she did develop hypoxic respiratory failure.  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  Infectious disease consulted.  Diastolic congestive heart failure.  Atrial fibrillation with rapid ventricular rate Eliquis tprescribed    LOS: Sor[place] _0 _1 :40 PM

## 2018-06-09 NOTE — Progress Notes (Signed)
PT Cancellation Note  Patient Details Name: [name] MRN: [id number] DOB: Oct 20, 1955   Cancelled Treatment:    Reason Eval/Treat Not Completed: Patient at procedure or test/unavailable. Will follow-up for PT treatment as schedule permits.  Mabeline Caras, PT, DPT Acute Rehabilitation Services  Pager [phone] Office [place] 06/09/2018, 7:32 AM

## 2018-06-11 DIAGNOSIS — N183 Chronic kidney disease, stage 3 (moderate): Secondary | ICD-10-CM | POA: Diagnosis not present

## 2018-06-11 DIAGNOSIS — N179 Acute kidney failure, unspecified: Secondary | ICD-10-CM | POA: Diagnosis not present

## 2018-06-13 DIAGNOSIS — Z992 Dependence on renal dialysis: Secondary | ICD-10-CM | POA: Diagnosis not present

## 2018-06-13 DIAGNOSIS — N186 End stage renal disease: Secondary | ICD-10-CM | POA: Diagnosis not present

## 2018-06-13 DIAGNOSIS — I132 Hypertensive heart and chronic kidney disease with heart failure and with stage 5 chronic kidney disease, or end stage renal disease: Secondary | ICD-10-CM | POA: Diagnosis not present

## 2018-06-13 DIAGNOSIS — I5032 Chronic diastolic (congestive) heart failure: Secondary | ICD-10-CM | POA: Diagnosis not present

## 2018-06-14 DIAGNOSIS — N179 Acute kidney failure, unspecified: Secondary | ICD-10-CM | POA: Diagnosis not present

## 2018-06-14 DIAGNOSIS — N183 Chronic kidney disease, stage 3 (moderate): Secondary | ICD-10-CM | POA: Diagnosis not present

## 2018-06-15 ENCOUNTER — Telehealth: Payer: Self-pay | Admitting: Hematology

## 2018-06-15 DIAGNOSIS — J690 Pneumonitis due to inhalation of food and vomit: Secondary | ICD-10-CM | POA: Diagnosis not present

## 2018-06-15 DIAGNOSIS — D3A00 Benign carcinoid tumor of unspecified site: Secondary | ICD-10-CM | POA: Diagnosis not present

## 2018-06-15 DIAGNOSIS — I10 Essential (primary) hypertension: Secondary | ICD-10-CM | POA: Diagnosis not present

## 2018-06-15 DIAGNOSIS — I5030 Unspecified diastolic (congestive) heart failure: Secondary | ICD-10-CM | POA: Diagnosis not present

## 2018-06-15 NOTE — Telephone Encounter (Signed)
Received a call from [place] at [facility] to reschedule [name]'s hospital follow up appt. The reason for the delay is because of  transportation being filled at the rehab facility. Ramona stated there are specific days the patient can come and when I tried to schedule earlier dates, she was unavailable to bring the. Appt has been rescheduled for the pt to see Dr. Irene Limbo on 4/29 at 920am.

## 2018-06-15 NOTE — Telephone Encounter (Signed)
Ramona from [facility] cld back to move the pt's appt due to a scheduling conflict on 6/43. Pt's new appt with Dr. Irene Limbo is on 5/6 at 9am.

## 2018-06-16 DIAGNOSIS — I5032 Chronic diastolic (congestive) heart failure: Secondary | ICD-10-CM | POA: Diagnosis not present

## 2018-06-16 DIAGNOSIS — E1122 Type 2 diabetes mellitus with diabetic chronic kidney disease: Secondary | ICD-10-CM | POA: Diagnosis not present

## 2018-06-16 DIAGNOSIS — I132 Hypertensive heart and chronic kidney disease with heart failure and with stage 5 chronic kidney disease, or end stage renal disease: Secondary | ICD-10-CM | POA: Diagnosis not present

## 2018-06-16 DIAGNOSIS — N179 Acute kidney failure, unspecified: Secondary | ICD-10-CM | POA: Diagnosis not present

## 2018-06-16 DIAGNOSIS — N183 Chronic kidney disease, stage 3 (moderate): Secondary | ICD-10-CM | POA: Diagnosis not present

## 2018-06-16 DIAGNOSIS — Z992 Dependence on renal dialysis: Secondary | ICD-10-CM | POA: Diagnosis not present

## 2018-06-18 DIAGNOSIS — N183 Chronic kidney disease, stage 3 (moderate): Secondary | ICD-10-CM | POA: Diagnosis not present

## 2018-06-18 DIAGNOSIS — N179 Acute kidney failure, unspecified: Secondary | ICD-10-CM | POA: Diagnosis not present

## 2018-06-20 ENCOUNTER — Inpatient Hospital Stay: Payer: Medicare Other | Admitting: Hematology

## 2018-06-21 DIAGNOSIS — N179 Acute kidney failure, unspecified: Secondary | ICD-10-CM | POA: Diagnosis not present

## 2018-06-21 DIAGNOSIS — N183 Chronic kidney disease, stage 3 (moderate): Secondary | ICD-10-CM | POA: Diagnosis not present

## 2018-06-23 ENCOUNTER — Telehealth: Payer: Self-pay

## 2018-06-23 DIAGNOSIS — I132 Hypertensive heart and chronic kidney disease with heart failure and with stage 5 chronic kidney disease, or end stage renal disease: Secondary | ICD-10-CM | POA: Diagnosis not present

## 2018-06-23 DIAGNOSIS — I5032 Chronic diastolic (congestive) heart failure: Secondary | ICD-10-CM | POA: Diagnosis not present

## 2018-06-23 DIAGNOSIS — N183 Chronic kidney disease, stage 3 (moderate): Secondary | ICD-10-CM | POA: Diagnosis not present

## 2018-06-23 DIAGNOSIS — E1122 Type 2 diabetes mellitus with diabetic chronic kidney disease: Secondary | ICD-10-CM | POA: Diagnosis not present

## 2018-06-23 DIAGNOSIS — D631 Anemia in chronic kidney disease: Secondary | ICD-10-CM | POA: Diagnosis not present

## 2018-06-23 DIAGNOSIS — N179 Acute kidney failure, unspecified: Secondary | ICD-10-CM | POA: Diagnosis not present

## 2018-06-23 NOTE — Telephone Encounter (Signed)

## 2018-06-25 DIAGNOSIS — N179 Acute kidney failure, unspecified: Secondary | ICD-10-CM | POA: Diagnosis not present

## 2018-06-25 DIAGNOSIS — N183 Chronic kidney disease, stage 3 (moderate): Secondary | ICD-10-CM | POA: Diagnosis not present

## 2018-06-27 ENCOUNTER — Ambulatory Visit (HOSPITAL_COMMUNITY): Payer: Medicare Other

## 2018-06-27 DIAGNOSIS — N183 Chronic kidney disease, stage 3 (moderate): Secondary | ICD-10-CM | POA: Diagnosis not present

## 2018-06-27 DIAGNOSIS — N179 Acute kidney failure, unspecified: Secondary | ICD-10-CM | POA: Diagnosis not present

## 2018-06-28 ENCOUNTER — Telehealth: Payer: Self-pay | Admitting: "Endocrinology

## 2018-06-28 ENCOUNTER — Ambulatory Visit (HOSPITAL_COMMUNITY): Payer: Medicare Other

## 2018-06-28 DIAGNOSIS — N186 End stage renal disease: Secondary | ICD-10-CM | POA: Diagnosis not present

## 2018-06-28 DIAGNOSIS — Z992 Dependence on renal dialysis: Secondary | ICD-10-CM | POA: Diagnosis not present

## 2018-06-28 NOTE — Telephone Encounter (Signed)
Pt was at the [facility] recently. Her Humullin was changed to Humulin R 20 units tid after meals. She just returned to [facility]. I notified them to fax over readings once they have a few days worth.

## 2018-06-28 NOTE — Telephone Encounter (Signed)
Order sent by Dr Dorris Fetch to [place]

## 2018-06-28 NOTE — Telephone Encounter (Signed)
The U500 insulin needs to be injected 15-20 minutes before meals, not after.

## 2018-06-29 ENCOUNTER — Other Ambulatory Visit: Payer: Self-pay

## 2018-06-29 ENCOUNTER — Telehealth: Payer: Medicare Other | Admitting: Cardiology

## 2018-06-29 ENCOUNTER — Telehealth: Payer: Self-pay

## 2018-06-29 ENCOUNTER — Ambulatory Visit (HOSPITAL_COMMUNITY): Payer: Medicare Other

## 2018-06-29 DIAGNOSIS — D509 Iron deficiency anemia, unspecified: Secondary | ICD-10-CM | POA: Diagnosis not present

## 2018-06-29 DIAGNOSIS — N2581 Secondary hyperparathyroidism of renal origin: Secondary | ICD-10-CM | POA: Diagnosis not present

## 2018-06-29 DIAGNOSIS — N179 Acute kidney failure, unspecified: Secondary | ICD-10-CM | POA: Diagnosis not present

## 2018-06-29 DIAGNOSIS — N186 End stage renal disease: Secondary | ICD-10-CM | POA: Diagnosis not present

## 2018-06-29 DIAGNOSIS — I5022 Chronic systolic (congestive) heart failure: Secondary | ICD-10-CM | POA: Diagnosis not present

## 2018-06-29 DIAGNOSIS — D649 Anemia, unspecified: Secondary | ICD-10-CM | POA: Diagnosis not present

## 2018-06-29 DIAGNOSIS — N183 Chronic kidney disease, stage 3 (moderate): Secondary | ICD-10-CM | POA: Diagnosis not present

## 2018-06-29 DIAGNOSIS — N181 Chronic kidney disease, stage 1: Secondary | ICD-10-CM | POA: Diagnosis not present

## 2018-06-29 DIAGNOSIS — M6281 Muscle weakness (generalized): Secondary | ICD-10-CM | POA: Diagnosis not present

## 2018-06-29 NOTE — Telephone Encounter (Signed)
Called the patient who resides at [facility] to room prior to telephone visit with Lyda Jester at 1:30 pm.  I was told by supervisor at the facility that the patient was presently at dialysis and was not returning until 4:30 pm.  We will try to reschedule 4/2 @ 3:30 pm.

## 2018-06-29 NOTE — Progress Notes (Signed)
Virtual Visit via Telephone Note    Evaluation Performed:  Follow-up visit  This visit type was conducted due to national recommendations for restrictions regarding the COVID-19 Pandemic (e.g. social distancing).  This format is felt to be most appropriate for this patient at this time.  All issues noted in this document were discussed and addressed.  No physical exam was performed (except for noted visual exam findings with Video Visits).  Please refer to the patient's chart (MyChart message for video visits and phone note for telephone visits) for the patient's consent to telehealth for [facility]  Date:  06/30/2018   ID:  [name], DOB 01-Nov-1955, MRN [id number]  Patient Location:  Nursing Facility   Provider location:   [place], [place]   PCP:  Rosita Fire, MD  Cardiologist:  Jenkins Rouge, MD  Electrophysiologist:  None   Chief Complaint: Post hospital follow-up, atrial fibrillation and ventricular tachycardia  History of Present Illness:    [name] is a 63 y.o. female who presents via audio/video conferencing for a telehealth visit today.    The patient does not have symptoms concerning for COVID-19 infection (fever, chills, cough, or new shortness of breath).   Reason for today's visit is for post hospital follow-up.  She has a complex past medical history as well as complex recent hospitalization.  The some of the information below was taken directly from the hospital cardiology consult/rounding notes.  To better summarize, notes indicate that in 2013 she was admitted with acute systolic CHF with EF 10%. She underwent cath showing reported 30% narrowing of the ramus and some very mild LAD disease. She was admitted in 2014 for CHF but also in the setting of acute renal failure, so was treated with Bidil and avoidance of nephrotoxic agents. She was on digoxin for a period of time. At last cardiology OV in 2018 she was  due to start a new medicine for Cushing's that could interfere with digoxin. EF had normalized and therefore Dr. Johnsie Cancel stopped it due to lack of clear indication.  She was recently admitted 2/17-2/20/20 with fever, chills, vomiting and CAP (multiple residents with ILI). During that admission she was found to have a LLL mass that would require further management. She returned to the ER 05/21/18 with severe respiratory distress requiring BiPAP. She was in a wide complex tachycardia at 170bpm that either represented ventricular tachycardia or an alternative tachyarrhythmia with widening of the QRS due to severe hyperkalemia >7.5. She was given amiodarone 300mg  bolus. Per notes, as she was being prepared to be shocked, she converted to reported junctional rhythm with a rate of 70. EKG showed sine wave morphology without obvious P waves. IV calcium was administered. She was intubated but vomited during the procedure. She was started on antibiotics, bicarb, IV calcium, nebulizer, D50 and insulin bolus. After stabilization of potassium her rhythm returned to narrow complex. Strep pneumo was positive. She was intubated for several days. In the interim, her renal failure has not improved - she has been oliguric requiring initiation of dialysis as inpatient. 2D echo 05/22/18 showed EF >65%, impaired diastolic relaxation, mild calcification of AV, small mobile density of the ventricular surface of aortic valve which was challenging to visualized. Our team was called to perform TEE which showed this was a calcified, heterogeneous, primarily immobile lesion on the left coronary cusp with appearance of subacute or healed vegetation vs calcified nodule. There was no LAA thrombus. LA size  was normal. Infectious disease was consulted and in the setting of normal blood cultures, they did not feel this represented infective endocarditis. She also had bronchoscopy to evaluate her LLL mass with pathology revealing typical carcinoid,  pending oncology evaluation.  During admission she was also noted to have episodes of PAF, initially diagnosed post-bronchoscopy, then again 3/1 requiring diltiazem drip (converted to oral), and again overnight requiring uptitration of diltiazem to 240mg . She was also treated w/ Toprol 100mg  daily.  Eliquis was started for anticoagulation.  Also due to worsening renal function she was started on hemodialysis.  She does dialysis 3 days a week, Monday Wednesday Friday.  Patient was discharged back to nursing facility on March 13.  Oncology follow-up pending.  Both patient and nursing assistant Butch Penny) are participating in telephone visit today.  The patient reports that she has done considerably well post discharge.  Nursing assistant also reports that she has done well since discharge.  She denies any palpitations.  RN checked her pulse manually and reports that it feels regular.  Resting heart rate in the 60s.  Blood pressure is well controlled.  She has not had any hypotension during dialysis.  She denies dyspnea.  She does not have any currents supplemental oxygen requirements.  She is breathing comfortably on room air and oxygen saturations have been normal.  She does use inhalers for asthma.  No CP. She denies any abnormal bleeding nor falls while on Eliquis.  Hemodialysis is going okay.    Prior CV studies:   The following studies were reviewed today:  2D echocardiogram 05/22/2018 IMPRESSIONS    1. The left ventricle has hyperdynamic systolic function, with an ejection fraction of >65%. The cavity size was normal. Left ventricular diastolic Doppler parameters are consistent with impaired relaxation.  2. The right ventricle has normal systolic function. The cavity was normal. There is no increase in right ventricular wall thickness.  3. The mitral valve is normal in structure.  4. The tricuspid valve is normal in structure.  5. The aortic valve is tricuspid Mild thickening of the aortic  valve Mild calcification of the aortic valve.  6. There is a small mobile density (1.1 x 0.5cm) on the ventricular surface of the aortic valve. Challenging to visualize. Consider TEE if clinically warranted.  7. The pulmonic valve was normal in structure.  Past Medical History:  Diagnosis Date   Arthritis    Asthma    Chronic diastolic CHF (congestive heart failure) ([place])    CKD (chronic kidney disease), stage III ([place])    Diabetes mellitus type 2 in obese ([place])    Hypertension    Iron deficiency anemia 10/16/2010   Mental handicap 10/16/2010   Mild CAD 2013   Morbid obesity ([facility])    OSA (obstructive sleep apnea)    Past Surgical History:  Procedure Laterality Date   ABDOMINAL HYSTERECTOMY     AV FISTULA PLACEMENT Right 06/06/2018   Procedure: ARTERIOVENOUS (AV) FISTULA CREATION;  Surgeon: Marty Heck, MD;  Location: [facility] OR;  Service: Vascular;  Laterality: Right;   CESAREAN SECTION     CHOLECYSTECTOMY     COLONOSCOPY  08/2010   normal TI, sigmoid polyp (adenoma ). Next TCS due  08/2015,   ESOPHAGOGASTRODUODENOSCOPY  08/2010   antral and duodenal erosions s/p bx (chronic gastritis, no h.pylori, no celiac dz ), hiatal hernia   IR FLUORO GUIDE CV LINE RIGHT  05/31/2018   IR US GUIDE VASC ACCESS RIGHT  05/31/2018   KNEE SURGERY  right knee @ 63 years of age   LEFT AND RIGHT HEART CATHETERIZATION WITH CORONARY ANGIOGRAM N/A 09/21/2011   Procedure: LEFT AND RIGHT HEART CATHETERIZATION WITH CORONARY ANGIOGRAM;  Surgeon: Birdie Riddle, MD;  Location: [facility] CATH LAB;  Service: Cardiovascular;  Laterality: N/A;     Current Meds  Medication Sig   ACCU-CHEK AVIVA PLUS test strip USE TO TEST THREE TIMES DAILY.   ACCU-CHEK SOFTCLIX LANCETS lancets USE TO TEST THREE TIMES DAILY.   acetaminophen (TYLENOL) 500 MG tablet Take 500 mg by mouth 2 (two) times daily as needed for mild pain.   albuterol (PROVENTIL HFA;VENTOLIN HFA) 108 (90 Base) MCG/ACT inhaler Inhale 2 puffs  into the lungs every 6 (six) hours as needed for shortness of breath.   apixaban (ELIQUIS) 5 MG TABS tablet Take 1 tablet (5 mg total) by mouth 2 (two) times daily.   diltiazem (CARDIZEM CD) 240 MG 24 hr capsule Take 1 capsule (240 mg total) by mouth daily.   Diphenhyd-Hydrocort-Nystatin (FIRST-DUKES MOUTHWASH) SUSP Use as directed 5 mLs in the mouth or throat 4 (four) times daily. Swish and spit   fluticasone (VERAMYST) 27.5 MCG/SPRAY nasal spray Place 2 sprays into the nose daily.   Fluticasone-Umeclidin-Vilant (TRELEGY ELLIPTA) 100-62.5-25 MCG/INH AEPB Inhale into the lungs daily.   guaiFENesin (ROBITUSSIN) 100 MG/5ML SOLN Take 5 mLs (100 mg total) by mouth every 4 (four) hours as needed for cough or to loosen phlegm.   hydrALAZINE (APRESOLINE) 25 MG tablet Take 1 tablet (25 mg total) by mouth every 8 (eight) hours for 30 days.   insulin regular human CONCENTRATED (HUMULIN R U-500 KWIKPEN) 500 UNIT/ML kwikpen Inject 20 Units into the skin 3 (three) times daily with meals. Inject 40 units with breakfast, 40 units with lunch, and 40 units with supper when blood glucose before food is greater than 90 mg/dL   ipratropium-albuterol (DUONEB) 0.5-2.5 (3) MG/3ML SOLN Take 3 mLs by nebulization every 6 (six) hours as needed (shortness of breath).   isosorbide dinitrate (ISORDIL) 10 MG tablet Take 1 tablet (10 mg total) by mouth 3 (three) times daily.   loratadine (CLARITIN) 10 MG tablet Take 10 mg by mouth daily.   metoprolol succinate (TOPROL-XL) 100 MG 24 hr tablet Take 1 tablet (100 mg total) by mouth daily. Take with or immediately following a meal.   NOVOFINE AUTOCOVER 30G X 8 MM MISC USE AS DIRECTED FOR 3 TIMES DAILY INSULIN ADMINISTRATION.   SURE COMFORT PEN NEEDLES 31G X 8 MM MISC USE AS DIRECTED WITH LEVEMIR AND NOVOLOG. UP TO FOUR TIMES DAILY.     Allergies:   Patient has no known allergies.   Social History   Tobacco Use   Smoking status: Former Smoker    Packs/day: 1.00     Years: 0.00    Pack years: 0.00    Types: Cigarettes    Last attempt to quit: 04/11/1996    Years since quitting: 22.2   Smokeless tobacco: Never Used   Tobacco comment: quit a couple year ago  Substance Use Topics   Alcohol use: No   Drug use: No     Family Hx: The patient's family history includes Atrial fibrillation in her father; Diabetes in her mother; Heart failure in her mother; Hypertension in her father, mother, and son; Kidney disease in an other family member. There is no history of Colon cancer.  ROS:   Please see the history of present illness.     All other systems reviewed and are negative.  Labs/Other Tests and Data Reviewed:    Recent Labs: 05/21/2018: B Natriuretic Peptide 125.0 05/23/2018: Magnesium 2.4 06/01/2018: TSH 3.292 06/02/2018: ALT 9 06/09/2018: BUN 61; Creatinine, Ser 4.18; Hemoglobin 7.8; Platelets 293; Potassium 4.6; Sodium 129   Recent Lipid Panel Lab Results  Component Value Date/Time   CHOL 141 06/02/2018 12:29 AM   TRIG 295 (H) 06/02/2018 12:29 AM   HDL 32 (L) 06/02/2018 12:29 AM   CHOLHDL 4.4 06/02/2018 12:29 AM   LDLCALC 50 06/02/2018 12:29 AM   LDLCALC 80 04/07/2017 09:28 AM    Wt Readings from Last 3 Encounters:  06/30/18 250 lb (113.4 kg)  06/09/18 253 lb 8.5 oz (115 kg)  05/14/18 254 lb 13.6 oz (115.6 kg)     Objective:    Vital Signs:  BP 113/62    Pulse 62    Temp (!) 97.3 F (36.3 C)    Resp 16    Ht 5' 4.8" (1.646 m)    Wt 250 lb (113.4 kg)    BMI 41.86 kg/m    Well sounding female in no acute distress.  Speaking in clear complete sentences.  Speech unlabored Breathing unlabored.  ASSESSMENT & PLAN:    1.  Atrial fibrillation: Newly diagnosed during hospitalization as outlined above.  Multiple medical issues were present at that time.  Per hospital records she was in normal sinus rhythm at time of hospital discharge on 06/10/2018.  Since that time the patient denies any palpitations.  RN at nursing facility  participated in telephone call today and check pulse manually and noted that pulse felt regular.  Heart rates in the 60s.  She reports full compliance with both metoprolol and Cardizem.  No side effects.  She is on Eliquis for stroke prophylaxis.  She reports full compliance and denies any abnormal bleeding and no falls.   2.  Ventricular tachycardia: Occurred during recent hospitalization in the setting of multiple medical issues outlined above as well as severe hyperkalemia with potassium of 7.5.  Arrhythmia was treated with IV amiodarone.  She had no further recurrence during that hospitalization.  Echocardiogram showed normal left ventricular ejection fraction.  Of note, hyperkalemia was also in the setting of abnormal renal function/end-stage renal disease.  She has since been started on hemodialysis.  She denies any palpitations.  No syncope/near syncope.  Continue metoprolol.  3.  End-stage renal disease: On hemodialysis Monday, Wednesday, Friday  4.  LLL mass: Status post bronchoscopy with pathology revealing typical carcinoid, pending oncology evaluation.  5.  Pneumonia: Treated with antibiotics.  She denies any dyspnea.  No cough.  6.  Small mobile density of the ventricular surface of aortic valve: Negative blood cultures.  No endocarditis. Infectious disease did not feel that antibiotics were needed.  7.  Anemia of chronic disease: In the setting of CKD.  She is on Eliquis for anticoagulation for stroke prophylaxis given atrial fibrillation.  Nephrology to monitor labs.  May need iron infusions.   COVID-19 Education: The signs and symptoms of COVID-19 were discussed with the patient and how to seek care for testing (follow up with PCP or arrange E-visit).  The importance of social distancing was discussed today.  Patient Risk:   After full review of this patient's clinical status, I feel that they are at least moderate risk at this time.  Time:   Today, I have spent 20 minutes with  the patient with telehealth technology discussing her post hospital course as well as ongoing management of atrial fibrillation.  Medication Adjustments/Labs and Tests Ordered: Current medicines are reviewed at length with the patient today.  Concerns regarding medicines are outlined above.  Tests Ordered: No orders of the defined types were placed in this encounter.  Medication Changes: No orders of the defined types were placed in this encounter.   Disposition:  Follow up In 3 to 4 months with MD.  Of note, patient has been seen in the past by Dr. Johnsie Cancel however she personally requested to be transferred to our [facility] clinic as she resides in [place] and is much more convenient for her.  She has requested Dr. Bronson Ing who follows her father. Will arrange transfer.   Signed, Lyda Jester, PA-C  06/30/2018 4:14 PM     [facility]

## 2018-06-29 NOTE — Progress Notes (Deleted)
Virtual Visit via Telephone Note    Evaluation Performed:  Follow-up visit  This visit type was conducted due to national recommendations for restrictions regarding the COVID-19 Pandemic (e.g. social distancing).  This format is felt to be most appropriate for this patient at this time.  All issues noted in this document were discussed and addressed.  No physical exam was performed (except for noted visual exam findings with Video Visits).  Please refer to the patient's chart (MyChart message for video visits and phone note for telephone visits) for the patient's consent to telehealth for [facility].  Date:  06/29/2018   ID:  [name], DOB 12-03-1955, MRN [id number]  Patient Location:  Home  Provider location:   [place], [place]   PCP:  Rosita Fire, MD  Cardiologist:  Jenkins Rouge, MD  Electrophysiologist:  None   Chief Complaint: [facility] F/u for VT and Afib  History of Present Illness:    [name] is a 63 y.o. female who presents via audio/video conferencing for a telehealth visit today.    The patient {does/does not:200015} have symptoms concerning for COVID-19 infection (fever, chills, cough, or new shortness of breath).   Reason for today's ED visit is for post hospital follow-up.  She has a complex past medical history as well as complex recent hospitalization.  The information below was taken directly from the hospital cardiology consult/rounding notes.  To better summarize, Notes indicate that in 2013 she was admitted with acute systolic CHF with EF 95%. She underwent cath showing reported 30% narrowing of the ramus and some very mild LAD disease. She was admitted in 2014 for CHF but also in the setting of acute renal failure, so was treated with Bidil and avoidance of nephrotoxic agents. She was on digoxin for a period of time. At last cardiology OV in 2018 she was due to start a new medicine for Cushing's that  could interfere with digoxin. EF had normalized and therefore Dr. Johnsie Cancel stopped it due to lack of clear indication.  She was recently admitted 2/17-2/20/20 with fever, chills, vomiting and CAP (multiple residents with ILI). During that admission she was found to have a LLL mass that would require further management. She returned to the ER 05/21/18 with severe respiratory distress requiring BiPAP. She was in a wide complex tachycardia at 170bpm that either represented ventricular tachycardia or an alternative tachyarrhythmia with widening of the QRS due to severe hyperkalemia >7.5. She was given amiodarone 300mg  bolus. Per notes, as she was being prepared to be shocked, she converted to reported junctional rhythm with a rate of 70. EKG showed sine wave morphology without obvious P waves. IV calcium was administered. She was intubated but vomited during the procedure. She was started on antibiotics, bicarb, IV calcium, nebulizer, D50 and insulin bolus. After stabilization of potassium her rhythm returned to narrow complex. Strep pneumo was positive. She was intubated for several days. In the interim, her renal failure has not improved - she has been oliguric requiring initiation of dialysis as inpatient. 2D echo 05/22/18 showed EF >65%, impaired diastolic relaxation, mild calcification of AV, small mobile density of the ventricular surface of aortic valve which was challenging to visualized. Our team was called to perform TEE which showed this was a calcified, heterogeneous, primarily immobile lesion on the left coronary cusp with appearance of subacute or healed vegetation vs calcified nodule. There was no LAA thrombus. LA size was normal. Infectious disease was  consulted and in the setting of normal blood cultures, they did not feel this represented infective endocarditis. She also had bronchoscopy to evaluate her LLL mass with pathology revealing typical carcinoid, pending oncology evaluation.  During  admission she was also noted to have episodes of PAF, initially diagnosed post-bronchoscopy, then again 3/1 requiring diltiazem drip (converted to oral), and again overnight requiring uptitration of diltiazem to 240mg . She was also treated w/ Toprol 100mg  daily.    Prior CV studies:   The following studies were reviewed today:  TEE 05/26/18 FINDINGS:  Aortic valve lesion noted, which appears to be heterogenous and calcified, suggesting a possible subacute/healed vegetation or calcified lesion in the setting of dialysis. Trivial aortic valve regurgitation. No lesions noted on mitral valve, tricuspid valve or pulmonary valve.   RECOMMENDATIONS:    Discussed with Dr. Valeta Harms, no other recommendations at this time.   Past Medical History:  Diagnosis Date   Arthritis    Asthma    Chronic diastolic CHF (congestive heart failure) ([place])    CKD (chronic kidney disease), stage III ([name])    Diabetes mellitus type 2 in obese ([place])    Hypertension    Iron deficiency anemia 10/16/2010   Mental handicap 10/16/2010   Mild CAD 2013   Morbid obesity ([facility])    OSA (obstructive sleep apnea)    Past Surgical History:  Procedure Laterality Date   ABDOMINAL HYSTERECTOMY     AV FISTULA PLACEMENT Right 06/06/2018   Procedure: ARTERIOVENOUS (AV) FISTULA CREATION;  Surgeon: Marty Heck, MD;  Location: [facility] OR;  Service: Vascular;  Laterality: Right;   CESAREAN SECTION     CHOLECYSTECTOMY     COLONOSCOPY  08/2010   normal TI, sigmoid polyp (adenoma ). Next TCS due  08/2015,   ESOPHAGOGASTRODUODENOSCOPY  08/2010   antral and duodenal erosions s/p bx (chronic gastritis, no h.pylori, no celiac dz ), hiatal hernia   IR FLUORO GUIDE CV LINE RIGHT  05/31/2018   IR US GUIDE VASC ACCESS RIGHT  05/31/2018   KNEE SURGERY     right knee @ 63 years of age   LEFT AND RIGHT HEART CATHETERIZATION WITH CORONARY ANGIOGRAM N/A 09/21/2011   Procedure: LEFT AND RIGHT HEART CATHETERIZATION WITH CORONARY  ANGIOGRAM;  Surgeon: Birdie Riddle, MD;  Location: [facility] CATH LAB;  Service: Cardiovascular;  Laterality: N/A;     No outpatient medications have been marked as taking for the 06/29/18 encounter (Appointment) with Consuelo Pandy, PA-C.     Allergies:   Patient has no known allergies.   Social History   Tobacco Use   Smoking status: Former Smoker    Packs/day: 1.00    Years: 0.00    Pack years: 0.00    Types: Cigarettes    Last attempt to quit: 04/11/1996    Years since quitting: 22.2   Smokeless tobacco: Never Used   Tobacco comment: quit a couple year ago  Substance Use Topics   Alcohol use: No   Drug use: No     Family Hx: The patient's family history includes Atrial fibrillation in her father; Diabetes in her mother; Heart failure in her mother; Hypertension in her father, mother, and son; Kidney disease in an other family member. There is no history of Colon cancer.  ROS:   Please see the history of present illness.    *** All other systems reviewed and are negative.   Labs/Other Tests and Data Reviewed:    Recent Labs: 05/21/2018: B Natriuretic Peptide 125.0  05/23/2018: Magnesium 2.4 06/01/2018: TSH 3.292 06/02/2018: ALT 9 06/09/2018: BUN 61; Creatinine, Ser 4.18; Hemoglobin 7.8; Platelets 293; Potassium 4.6; Sodium 129   Recent Lipid Panel Lab Results  Component Value Date/Time   CHOL 141 06/02/2018 12:29 AM   TRIG 295 (H) 06/02/2018 12:29 AM   HDL 32 (L) 06/02/2018 12:29 AM   CHOLHDL 4.4 06/02/2018 12:29 AM   LDLCALC 50 06/02/2018 12:29 AM   LDLCALC 80 04/07/2017 09:28 AM    Wt Readings from Last 3 Encounters:  06/09/18 253 lb 8.5 oz (115 kg)  05/14/18 254 lb 13.6 oz (115.6 kg)  03/02/18 266 lb (120.7 kg)     Objective:    Vital Signs:  There were no vitals taken for this visit.   Well nourished, well developed female in no*** acute distress. ***  ASSESSMENT & PLAN:    1.  ***  COVID-19 Education: The signs and symptoms of COVID-19 were  discussed with the patient and how to seek care for testing (follow up with PCP or arrange E-visit).  ***The importance of social distancing was discussed today.  Patient Risk:   After full review of this patient's clinical status, I feel that they are at least moderate risk at this time.  Time:   Today, I have spent *** minutes with the patient with telehealth technology discussing ***.     Medication Adjustments/Labs and Tests Ordered: Current medicines are reviewed at length with the patient today.  Concerns regarding medicines are outlined above.  Tests Ordered: No orders of the defined types were placed in this encounter.  Medication Changes: No orders of the defined types were placed in this encounter.   Disposition:  Follow up {follow up:15908}  Signed, Lyda Jester, PA-C  06/29/2018 1:17 PM    [facility]

## 2018-06-30 ENCOUNTER — Telehealth (INDEPENDENT_AMBULATORY_CARE_PROVIDER_SITE_OTHER): Payer: Medicare Other | Admitting: Cardiology

## 2018-06-30 ENCOUNTER — Encounter: Payer: Self-pay | Admitting: Cardiology

## 2018-06-30 ENCOUNTER — Other Ambulatory Visit: Payer: Self-pay

## 2018-06-30 VITALS — BP 113/62 | HR 62 | Temp 97.3°F | Resp 16 | Ht 64.8 in | Wt 250.0 lb

## 2018-06-30 DIAGNOSIS — I5022 Chronic systolic (congestive) heart failure: Secondary | ICD-10-CM | POA: Diagnosis not present

## 2018-06-30 DIAGNOSIS — J441 Chronic obstructive pulmonary disease with (acute) exacerbation: Secondary | ICD-10-CM | POA: Diagnosis not present

## 2018-06-30 DIAGNOSIS — I480 Paroxysmal atrial fibrillation: Secondary | ICD-10-CM

## 2018-06-30 DIAGNOSIS — N186 End stage renal disease: Secondary | ICD-10-CM | POA: Diagnosis not present

## 2018-06-30 DIAGNOSIS — M6281 Muscle weakness (generalized): Secondary | ICD-10-CM | POA: Diagnosis not present

## 2018-06-30 NOTE — Patient Instructions (Signed)
Medication Instructions:  none If you need a refill on your cardiac medications before your next appointment, please call your pharmacy.   Lab work: none If you have labs (blood work) drawn today and your tests are completely normal, you will receive your results only by: Marland Kitchen MyChart Message (if you have MyChart) OR . A paper copy in the mail If you have any lab test that is abnormal or we need to change your treatment, we will call you to review the results.  Testing/Procedures: none  Follow-Up: 4 MONTHS with Dr Bronson Ing in [place], you and your health needs are our priority.  As part of our continuing mission to provide you with exceptional heart care, we have created designated Provider Care Teams.  These Care Teams include your primary Cardiologist (physician) and Advanced Practice Providers (APPs -  Physician Assistants and Nurse Practitioners) who all work together to provide you with the care you need, when you need it. .   Any Other Special Instructions Will Be Listed Below (If Applicable).

## 2018-07-01 DIAGNOSIS — D509 Iron deficiency anemia, unspecified: Secondary | ICD-10-CM | POA: Diagnosis not present

## 2018-07-01 DIAGNOSIS — D649 Anemia, unspecified: Secondary | ICD-10-CM | POA: Diagnosis not present

## 2018-07-01 DIAGNOSIS — N183 Chronic kidney disease, stage 3 (moderate): Secondary | ICD-10-CM | POA: Diagnosis not present

## 2018-07-01 DIAGNOSIS — N181 Chronic kidney disease, stage 1: Secondary | ICD-10-CM | POA: Diagnosis not present

## 2018-07-01 DIAGNOSIS — N2581 Secondary hyperparathyroidism of renal origin: Secondary | ICD-10-CM | POA: Diagnosis not present

## 2018-07-01 DIAGNOSIS — N179 Acute kidney failure, unspecified: Secondary | ICD-10-CM | POA: Diagnosis not present

## 2018-07-04 DIAGNOSIS — N181 Chronic kidney disease, stage 1: Secondary | ICD-10-CM | POA: Diagnosis not present

## 2018-07-04 DIAGNOSIS — D649 Anemia, unspecified: Secondary | ICD-10-CM | POA: Diagnosis not present

## 2018-07-04 DIAGNOSIS — D509 Iron deficiency anemia, unspecified: Secondary | ICD-10-CM | POA: Diagnosis not present

## 2018-07-04 DIAGNOSIS — N183 Chronic kidney disease, stage 3 (moderate): Secondary | ICD-10-CM | POA: Diagnosis not present

## 2018-07-04 DIAGNOSIS — N2581 Secondary hyperparathyroidism of renal origin: Secondary | ICD-10-CM | POA: Diagnosis not present

## 2018-07-04 DIAGNOSIS — N179 Acute kidney failure, unspecified: Secondary | ICD-10-CM | POA: Diagnosis not present

## 2018-07-05 ENCOUNTER — Other Ambulatory Visit: Payer: Self-pay

## 2018-07-05 DIAGNOSIS — N186 End stage renal disease: Secondary | ICD-10-CM | POA: Diagnosis not present

## 2018-07-05 DIAGNOSIS — M6281 Muscle weakness (generalized): Secondary | ICD-10-CM | POA: Diagnosis not present

## 2018-07-05 DIAGNOSIS — I5022 Chronic systolic (congestive) heart failure: Secondary | ICD-10-CM | POA: Diagnosis not present

## 2018-07-05 MED ORDER — INSULIN REGULAR HUMAN (CONC) 500 UNIT/ML ~~LOC~~ SOPN: 20.0000 [IU] | PEN_INJECTOR | Freq: Three times a day (TID) | SUBCUTANEOUS | 2 refills | Status: DC

## 2018-07-06 DIAGNOSIS — N2581 Secondary hyperparathyroidism of renal origin: Secondary | ICD-10-CM | POA: Diagnosis not present

## 2018-07-06 DIAGNOSIS — D649 Anemia, unspecified: Secondary | ICD-10-CM | POA: Diagnosis not present

## 2018-07-06 DIAGNOSIS — I5022 Chronic systolic (congestive) heart failure: Secondary | ICD-10-CM | POA: Diagnosis not present

## 2018-07-06 DIAGNOSIS — N179 Acute kidney failure, unspecified: Secondary | ICD-10-CM | POA: Diagnosis not present

## 2018-07-06 DIAGNOSIS — M6281 Muscle weakness (generalized): Secondary | ICD-10-CM | POA: Diagnosis not present

## 2018-07-06 DIAGNOSIS — D509 Iron deficiency anemia, unspecified: Secondary | ICD-10-CM | POA: Diagnosis not present

## 2018-07-06 DIAGNOSIS — N183 Chronic kidney disease, stage 3 (moderate): Secondary | ICD-10-CM | POA: Diagnosis not present

## 2018-07-06 DIAGNOSIS — N186 End stage renal disease: Secondary | ICD-10-CM | POA: Diagnosis not present

## 2018-07-06 DIAGNOSIS — N181 Chronic kidney disease, stage 1: Secondary | ICD-10-CM | POA: Diagnosis not present

## 2018-07-08 ENCOUNTER — Ambulatory Visit (HOSPITAL_COMMUNITY): Admission: RE | Admit: 2018-07-08 | Payer: Medicare Other | Source: Ambulatory Visit

## 2018-07-08 ENCOUNTER — Encounter (HOSPITAL_COMMUNITY): Payer: Self-pay

## 2018-07-08 DIAGNOSIS — D509 Iron deficiency anemia, unspecified: Secondary | ICD-10-CM | POA: Diagnosis not present

## 2018-07-08 DIAGNOSIS — N179 Acute kidney failure, unspecified: Secondary | ICD-10-CM | POA: Diagnosis not present

## 2018-07-08 DIAGNOSIS — N183 Chronic kidney disease, stage 3 (moderate): Secondary | ICD-10-CM | POA: Diagnosis not present

## 2018-07-08 DIAGNOSIS — N2581 Secondary hyperparathyroidism of renal origin: Secondary | ICD-10-CM | POA: Diagnosis not present

## 2018-07-08 DIAGNOSIS — N181 Chronic kidney disease, stage 1: Secondary | ICD-10-CM | POA: Diagnosis not present

## 2018-07-08 DIAGNOSIS — D649 Anemia, unspecified: Secondary | ICD-10-CM | POA: Diagnosis not present

## 2018-07-11 DIAGNOSIS — N2581 Secondary hyperparathyroidism of renal origin: Secondary | ICD-10-CM | POA: Diagnosis not present

## 2018-07-11 DIAGNOSIS — N179 Acute kidney failure, unspecified: Secondary | ICD-10-CM | POA: Diagnosis not present

## 2018-07-11 DIAGNOSIS — N183 Chronic kidney disease, stage 3 (moderate): Secondary | ICD-10-CM | POA: Diagnosis not present

## 2018-07-11 DIAGNOSIS — D509 Iron deficiency anemia, unspecified: Secondary | ICD-10-CM | POA: Diagnosis not present

## 2018-07-11 DIAGNOSIS — D649 Anemia, unspecified: Secondary | ICD-10-CM | POA: Diagnosis not present

## 2018-07-11 DIAGNOSIS — N181 Chronic kidney disease, stage 1: Secondary | ICD-10-CM | POA: Diagnosis not present

## 2018-07-12 DIAGNOSIS — N186 End stage renal disease: Secondary | ICD-10-CM | POA: Diagnosis not present

## 2018-07-12 DIAGNOSIS — I5022 Chronic systolic (congestive) heart failure: Secondary | ICD-10-CM | POA: Diagnosis not present

## 2018-07-12 DIAGNOSIS — M6281 Muscle weakness (generalized): Secondary | ICD-10-CM | POA: Diagnosis not present

## 2018-07-13 DIAGNOSIS — N179 Acute kidney failure, unspecified: Secondary | ICD-10-CM | POA: Diagnosis not present

## 2018-07-13 DIAGNOSIS — D649 Anemia, unspecified: Secondary | ICD-10-CM | POA: Diagnosis not present

## 2018-07-13 DIAGNOSIS — D509 Iron deficiency anemia, unspecified: Secondary | ICD-10-CM | POA: Diagnosis not present

## 2018-07-13 DIAGNOSIS — N183 Chronic kidney disease, stage 3 (moderate): Secondary | ICD-10-CM | POA: Diagnosis not present

## 2018-07-13 DIAGNOSIS — N186 End stage renal disease: Secondary | ICD-10-CM | POA: Diagnosis not present

## 2018-07-13 DIAGNOSIS — I5022 Chronic systolic (congestive) heart failure: Secondary | ICD-10-CM | POA: Diagnosis not present

## 2018-07-13 DIAGNOSIS — N2581 Secondary hyperparathyroidism of renal origin: Secondary | ICD-10-CM | POA: Diagnosis not present

## 2018-07-13 DIAGNOSIS — M6281 Muscle weakness (generalized): Secondary | ICD-10-CM | POA: Diagnosis not present

## 2018-07-13 DIAGNOSIS — N181 Chronic kidney disease, stage 1: Secondary | ICD-10-CM | POA: Diagnosis not present

## 2018-07-15 DIAGNOSIS — Z7901 Long term (current) use of anticoagulants: Secondary | ICD-10-CM | POA: Diagnosis not present

## 2018-07-15 DIAGNOSIS — I5042 Chronic combined systolic (congestive) and diastolic (congestive) heart failure: Secondary | ICD-10-CM | POA: Diagnosis not present

## 2018-07-15 DIAGNOSIS — N179 Acute kidney failure, unspecified: Secondary | ICD-10-CM | POA: Diagnosis not present

## 2018-07-15 DIAGNOSIS — D649 Anemia, unspecified: Secondary | ICD-10-CM | POA: Diagnosis not present

## 2018-07-15 DIAGNOSIS — J9611 Chronic respiratory failure with hypoxia: Secondary | ICD-10-CM | POA: Diagnosis not present

## 2018-07-15 DIAGNOSIS — J441 Chronic obstructive pulmonary disease with (acute) exacerbation: Secondary | ICD-10-CM | POA: Diagnosis not present

## 2018-07-15 DIAGNOSIS — D509 Iron deficiency anemia, unspecified: Secondary | ICD-10-CM | POA: Diagnosis not present

## 2018-07-15 DIAGNOSIS — I480 Paroxysmal atrial fibrillation: Secondary | ICD-10-CM | POA: Diagnosis not present

## 2018-07-15 DIAGNOSIS — N183 Chronic kidney disease, stage 3 (moderate): Secondary | ICD-10-CM | POA: Diagnosis not present

## 2018-07-15 DIAGNOSIS — Z992 Dependence on renal dialysis: Secondary | ICD-10-CM | POA: Diagnosis not present

## 2018-07-15 DIAGNOSIS — D500 Iron deficiency anemia secondary to blood loss (chronic): Secondary | ICD-10-CM | POA: Diagnosis not present

## 2018-07-15 DIAGNOSIS — N186 End stage renal disease: Secondary | ICD-10-CM | POA: Diagnosis not present

## 2018-07-15 DIAGNOSIS — R2689 Other abnormalities of gait and mobility: Secondary | ICD-10-CM | POA: Diagnosis not present

## 2018-07-15 DIAGNOSIS — Z794 Long term (current) use of insulin: Secondary | ICD-10-CM | POA: Diagnosis not present

## 2018-07-15 DIAGNOSIS — I130 Hypertensive heart and chronic kidney disease with heart failure and stage 1 through stage 4 chronic kidney disease, or unspecified chronic kidney disease: Secondary | ICD-10-CM | POA: Diagnosis not present

## 2018-07-15 DIAGNOSIS — J45909 Unspecified asthma, uncomplicated: Secondary | ICD-10-CM | POA: Diagnosis not present

## 2018-07-15 DIAGNOSIS — G4733 Obstructive sleep apnea (adult) (pediatric): Secondary | ICD-10-CM | POA: Diagnosis not present

## 2018-07-15 DIAGNOSIS — N181 Chronic kidney disease, stage 1: Secondary | ICD-10-CM | POA: Diagnosis not present

## 2018-07-15 DIAGNOSIS — C7A00 Malignant carcinoid tumor of unspecified site: Secondary | ICD-10-CM | POA: Diagnosis not present

## 2018-07-15 DIAGNOSIS — N2581 Secondary hyperparathyroidism of renal origin: Secondary | ICD-10-CM | POA: Diagnosis not present

## 2018-07-15 DIAGNOSIS — E1122 Type 2 diabetes mellitus with diabetic chronic kidney disease: Secondary | ICD-10-CM | POA: Diagnosis not present

## 2018-07-15 DIAGNOSIS — R41841 Cognitive communication deficit: Secondary | ICD-10-CM | POA: Diagnosis not present

## 2018-07-18 DIAGNOSIS — Z992 Dependence on renal dialysis: Secondary | ICD-10-CM | POA: Diagnosis not present

## 2018-07-18 DIAGNOSIS — N2581 Secondary hyperparathyroidism of renal origin: Secondary | ICD-10-CM | POA: Diagnosis not present

## 2018-07-18 DIAGNOSIS — N181 Chronic kidney disease, stage 1: Secondary | ICD-10-CM | POA: Diagnosis not present

## 2018-07-18 DIAGNOSIS — N179 Acute kidney failure, unspecified: Secondary | ICD-10-CM | POA: Diagnosis not present

## 2018-07-18 DIAGNOSIS — J441 Chronic obstructive pulmonary disease with (acute) exacerbation: Secondary | ICD-10-CM | POA: Diagnosis not present

## 2018-07-18 DIAGNOSIS — I130 Hypertensive heart and chronic kidney disease with heart failure and stage 1 through stage 4 chronic kidney disease, or unspecified chronic kidney disease: Secondary | ICD-10-CM | POA: Diagnosis not present

## 2018-07-18 DIAGNOSIS — N186 End stage renal disease: Secondary | ICD-10-CM | POA: Diagnosis not present

## 2018-07-18 DIAGNOSIS — D649 Anemia, unspecified: Secondary | ICD-10-CM | POA: Diagnosis not present

## 2018-07-18 DIAGNOSIS — I5042 Chronic combined systolic (congestive) and diastolic (congestive) heart failure: Secondary | ICD-10-CM | POA: Diagnosis not present

## 2018-07-18 DIAGNOSIS — N183 Chronic kidney disease, stage 3 (moderate): Secondary | ICD-10-CM | POA: Diagnosis not present

## 2018-07-18 DIAGNOSIS — D509 Iron deficiency anemia, unspecified: Secondary | ICD-10-CM | POA: Diagnosis not present

## 2018-07-18 DIAGNOSIS — E1122 Type 2 diabetes mellitus with diabetic chronic kidney disease: Secondary | ICD-10-CM | POA: Diagnosis not present

## 2018-07-19 DIAGNOSIS — J441 Chronic obstructive pulmonary disease with (acute) exacerbation: Secondary | ICD-10-CM | POA: Diagnosis not present

## 2018-07-19 DIAGNOSIS — I130 Hypertensive heart and chronic kidney disease with heart failure and stage 1 through stage 4 chronic kidney disease, or unspecified chronic kidney disease: Secondary | ICD-10-CM | POA: Diagnosis not present

## 2018-07-19 DIAGNOSIS — E1122 Type 2 diabetes mellitus with diabetic chronic kidney disease: Secondary | ICD-10-CM | POA: Diagnosis not present

## 2018-07-19 DIAGNOSIS — I5042 Chronic combined systolic (congestive) and diastolic (congestive) heart failure: Secondary | ICD-10-CM | POA: Diagnosis not present

## 2018-07-19 DIAGNOSIS — Z992 Dependence on renal dialysis: Secondary | ICD-10-CM | POA: Diagnosis not present

## 2018-07-19 DIAGNOSIS — N186 End stage renal disease: Secondary | ICD-10-CM | POA: Diagnosis not present

## 2018-07-20 DIAGNOSIS — N181 Chronic kidney disease, stage 1: Secondary | ICD-10-CM | POA: Diagnosis not present

## 2018-07-20 DIAGNOSIS — J441 Chronic obstructive pulmonary disease with (acute) exacerbation: Secondary | ICD-10-CM | POA: Diagnosis not present

## 2018-07-20 DIAGNOSIS — D509 Iron deficiency anemia, unspecified: Secondary | ICD-10-CM | POA: Diagnosis not present

## 2018-07-20 DIAGNOSIS — N179 Acute kidney failure, unspecified: Secondary | ICD-10-CM | POA: Diagnosis not present

## 2018-07-20 DIAGNOSIS — N2581 Secondary hyperparathyroidism of renal origin: Secondary | ICD-10-CM | POA: Diagnosis not present

## 2018-07-20 DIAGNOSIS — N183 Chronic kidney disease, stage 3 (moderate): Secondary | ICD-10-CM | POA: Diagnosis not present

## 2018-07-20 DIAGNOSIS — I130 Hypertensive heart and chronic kidney disease with heart failure and stage 1 through stage 4 chronic kidney disease, or unspecified chronic kidney disease: Secondary | ICD-10-CM | POA: Diagnosis not present

## 2018-07-20 DIAGNOSIS — I5042 Chronic combined systolic (congestive) and diastolic (congestive) heart failure: Secondary | ICD-10-CM | POA: Diagnosis not present

## 2018-07-20 DIAGNOSIS — D649 Anemia, unspecified: Secondary | ICD-10-CM | POA: Diagnosis not present

## 2018-07-20 DIAGNOSIS — N186 End stage renal disease: Secondary | ICD-10-CM | POA: Diagnosis not present

## 2018-07-20 DIAGNOSIS — E1122 Type 2 diabetes mellitus with diabetic chronic kidney disease: Secondary | ICD-10-CM | POA: Diagnosis not present

## 2018-07-20 DIAGNOSIS — Z992 Dependence on renal dialysis: Secondary | ICD-10-CM | POA: Diagnosis not present

## 2018-07-21 DIAGNOSIS — I5042 Chronic combined systolic (congestive) and diastolic (congestive) heart failure: Secondary | ICD-10-CM | POA: Diagnosis not present

## 2018-07-21 DIAGNOSIS — N186 End stage renal disease: Secondary | ICD-10-CM | POA: Diagnosis not present

## 2018-07-21 DIAGNOSIS — E1122 Type 2 diabetes mellitus with diabetic chronic kidney disease: Secondary | ICD-10-CM | POA: Diagnosis not present

## 2018-07-21 DIAGNOSIS — Z992 Dependence on renal dialysis: Secondary | ICD-10-CM | POA: Diagnosis not present

## 2018-07-21 DIAGNOSIS — J441 Chronic obstructive pulmonary disease with (acute) exacerbation: Secondary | ICD-10-CM | POA: Diagnosis not present

## 2018-07-21 DIAGNOSIS — I130 Hypertensive heart and chronic kidney disease with heart failure and stage 1 through stage 4 chronic kidney disease, or unspecified chronic kidney disease: Secondary | ICD-10-CM | POA: Diagnosis not present

## 2018-07-22 DIAGNOSIS — N181 Chronic kidney disease, stage 1: Secondary | ICD-10-CM | POA: Diagnosis not present

## 2018-07-22 DIAGNOSIS — N183 Chronic kidney disease, stage 3 (moderate): Secondary | ICD-10-CM | POA: Diagnosis not present

## 2018-07-22 DIAGNOSIS — N179 Acute kidney failure, unspecified: Secondary | ICD-10-CM | POA: Diagnosis not present

## 2018-07-22 DIAGNOSIS — D509 Iron deficiency anemia, unspecified: Secondary | ICD-10-CM | POA: Diagnosis not present

## 2018-07-22 DIAGNOSIS — D649 Anemia, unspecified: Secondary | ICD-10-CM | POA: Diagnosis not present

## 2018-07-22 DIAGNOSIS — N2581 Secondary hyperparathyroidism of renal origin: Secondary | ICD-10-CM | POA: Diagnosis not present

## 2018-07-25 ENCOUNTER — Inpatient Hospital Stay: Payer: Medicare Other | Admitting: Hematology

## 2018-07-25 DIAGNOSIS — N181 Chronic kidney disease, stage 1: Secondary | ICD-10-CM | POA: Diagnosis not present

## 2018-07-25 DIAGNOSIS — N2581 Secondary hyperparathyroidism of renal origin: Secondary | ICD-10-CM | POA: Diagnosis not present

## 2018-07-25 DIAGNOSIS — N179 Acute kidney failure, unspecified: Secondary | ICD-10-CM | POA: Diagnosis not present

## 2018-07-25 DIAGNOSIS — D649 Anemia, unspecified: Secondary | ICD-10-CM | POA: Diagnosis not present

## 2018-07-25 DIAGNOSIS — D509 Iron deficiency anemia, unspecified: Secondary | ICD-10-CM | POA: Diagnosis not present

## 2018-07-25 DIAGNOSIS — N183 Chronic kidney disease, stage 3 (moderate): Secondary | ICD-10-CM | POA: Diagnosis not present

## 2018-07-26 ENCOUNTER — Encounter: Payer: Medicare Other | Admitting: Family

## 2018-07-26 ENCOUNTER — Encounter (HOSPITAL_COMMUNITY): Payer: Medicare Other

## 2018-07-26 DIAGNOSIS — J441 Chronic obstructive pulmonary disease with (acute) exacerbation: Secondary | ICD-10-CM | POA: Diagnosis not present

## 2018-07-26 DIAGNOSIS — N186 End stage renal disease: Secondary | ICD-10-CM | POA: Diagnosis not present

## 2018-07-26 DIAGNOSIS — I130 Hypertensive heart and chronic kidney disease with heart failure and stage 1 through stage 4 chronic kidney disease, or unspecified chronic kidney disease: Secondary | ICD-10-CM | POA: Diagnosis not present

## 2018-07-26 DIAGNOSIS — I5042 Chronic combined systolic (congestive) and diastolic (congestive) heart failure: Secondary | ICD-10-CM | POA: Diagnosis not present

## 2018-07-26 DIAGNOSIS — Z992 Dependence on renal dialysis: Secondary | ICD-10-CM | POA: Diagnosis not present

## 2018-07-26 DIAGNOSIS — E1122 Type 2 diabetes mellitus with diabetic chronic kidney disease: Secondary | ICD-10-CM | POA: Diagnosis not present

## 2018-07-27 ENCOUNTER — Encounter (HOSPITAL_COMMUNITY): Payer: Medicare Other

## 2018-07-27 ENCOUNTER — Inpatient Hospital Stay: Payer: Medicare Other | Admitting: Hematology

## 2018-07-27 DIAGNOSIS — D649 Anemia, unspecified: Secondary | ICD-10-CM | POA: Diagnosis not present

## 2018-07-27 DIAGNOSIS — E1122 Type 2 diabetes mellitus with diabetic chronic kidney disease: Secondary | ICD-10-CM | POA: Diagnosis not present

## 2018-07-27 DIAGNOSIS — J441 Chronic obstructive pulmonary disease with (acute) exacerbation: Secondary | ICD-10-CM | POA: Diagnosis not present

## 2018-07-27 DIAGNOSIS — N2581 Secondary hyperparathyroidism of renal origin: Secondary | ICD-10-CM | POA: Diagnosis not present

## 2018-07-27 DIAGNOSIS — N179 Acute kidney failure, unspecified: Secondary | ICD-10-CM | POA: Diagnosis not present

## 2018-07-27 DIAGNOSIS — N186 End stage renal disease: Secondary | ICD-10-CM | POA: Diagnosis not present

## 2018-07-27 DIAGNOSIS — Z992 Dependence on renal dialysis: Secondary | ICD-10-CM | POA: Diagnosis not present

## 2018-07-27 DIAGNOSIS — D509 Iron deficiency anemia, unspecified: Secondary | ICD-10-CM | POA: Diagnosis not present

## 2018-07-27 DIAGNOSIS — I130 Hypertensive heart and chronic kidney disease with heart failure and stage 1 through stage 4 chronic kidney disease, or unspecified chronic kidney disease: Secondary | ICD-10-CM | POA: Diagnosis not present

## 2018-07-27 DIAGNOSIS — N181 Chronic kidney disease, stage 1: Secondary | ICD-10-CM | POA: Diagnosis not present

## 2018-07-27 DIAGNOSIS — I5042 Chronic combined systolic (congestive) and diastolic (congestive) heart failure: Secondary | ICD-10-CM | POA: Diagnosis not present

## 2018-07-27 DIAGNOSIS — N183 Chronic kidney disease, stage 3 (moderate): Secondary | ICD-10-CM | POA: Diagnosis not present

## 2018-07-28 DIAGNOSIS — I130 Hypertensive heart and chronic kidney disease with heart failure and stage 1 through stage 4 chronic kidney disease, or unspecified chronic kidney disease: Secondary | ICD-10-CM | POA: Diagnosis not present

## 2018-07-28 DIAGNOSIS — E1122 Type 2 diabetes mellitus with diabetic chronic kidney disease: Secondary | ICD-10-CM | POA: Diagnosis not present

## 2018-07-28 DIAGNOSIS — J441 Chronic obstructive pulmonary disease with (acute) exacerbation: Secondary | ICD-10-CM | POA: Diagnosis not present

## 2018-07-28 DIAGNOSIS — Z992 Dependence on renal dialysis: Secondary | ICD-10-CM | POA: Diagnosis not present

## 2018-07-28 DIAGNOSIS — I5042 Chronic combined systolic (congestive) and diastolic (congestive) heart failure: Secondary | ICD-10-CM | POA: Diagnosis not present

## 2018-07-28 DIAGNOSIS — N186 End stage renal disease: Secondary | ICD-10-CM | POA: Diagnosis not present

## 2018-07-29 DIAGNOSIS — D509 Iron deficiency anemia, unspecified: Secondary | ICD-10-CM | POA: Diagnosis not present

## 2018-07-29 DIAGNOSIS — Z992 Dependence on renal dialysis: Secondary | ICD-10-CM | POA: Diagnosis not present

## 2018-07-29 DIAGNOSIS — I130 Hypertensive heart and chronic kidney disease with heart failure and stage 1 through stage 4 chronic kidney disease, or unspecified chronic kidney disease: Secondary | ICD-10-CM | POA: Diagnosis not present

## 2018-07-29 DIAGNOSIS — Z23 Encounter for immunization: Secondary | ICD-10-CM | POA: Diagnosis not present

## 2018-07-29 DIAGNOSIS — N179 Acute kidney failure, unspecified: Secondary | ICD-10-CM | POA: Diagnosis not present

## 2018-07-29 DIAGNOSIS — N186 End stage renal disease: Secondary | ICD-10-CM | POA: Diagnosis not present

## 2018-07-29 DIAGNOSIS — E1122 Type 2 diabetes mellitus with diabetic chronic kidney disease: Secondary | ICD-10-CM | POA: Diagnosis not present

## 2018-07-29 DIAGNOSIS — I5042 Chronic combined systolic (congestive) and diastolic (congestive) heart failure: Secondary | ICD-10-CM | POA: Diagnosis not present

## 2018-07-29 DIAGNOSIS — J441 Chronic obstructive pulmonary disease with (acute) exacerbation: Secondary | ICD-10-CM | POA: Diagnosis not present

## 2018-07-29 DIAGNOSIS — N183 Chronic kidney disease, stage 3 (moderate): Secondary | ICD-10-CM | POA: Diagnosis not present

## 2018-07-29 DIAGNOSIS — D649 Anemia, unspecified: Secondary | ICD-10-CM | POA: Diagnosis not present

## 2018-07-29 DIAGNOSIS — N189 Chronic kidney disease, unspecified: Secondary | ICD-10-CM | POA: Diagnosis not present

## 2018-08-01 DIAGNOSIS — I5042 Chronic combined systolic (congestive) and diastolic (congestive) heart failure: Secondary | ICD-10-CM | POA: Diagnosis not present

## 2018-08-01 DIAGNOSIS — E1122 Type 2 diabetes mellitus with diabetic chronic kidney disease: Secondary | ICD-10-CM | POA: Diagnosis not present

## 2018-08-01 DIAGNOSIS — J441 Chronic obstructive pulmonary disease with (acute) exacerbation: Secondary | ICD-10-CM | POA: Diagnosis not present

## 2018-08-01 DIAGNOSIS — N183 Chronic kidney disease, stage 3 (moderate): Secondary | ICD-10-CM | POA: Diagnosis not present

## 2018-08-01 DIAGNOSIS — D509 Iron deficiency anemia, unspecified: Secondary | ICD-10-CM | POA: Diagnosis not present

## 2018-08-01 DIAGNOSIS — Z992 Dependence on renal dialysis: Secondary | ICD-10-CM | POA: Diagnosis not present

## 2018-08-01 DIAGNOSIS — N186 End stage renal disease: Secondary | ICD-10-CM | POA: Diagnosis not present

## 2018-08-01 DIAGNOSIS — D649 Anemia, unspecified: Secondary | ICD-10-CM | POA: Diagnosis not present

## 2018-08-01 DIAGNOSIS — Z23 Encounter for immunization: Secondary | ICD-10-CM | POA: Diagnosis not present

## 2018-08-01 DIAGNOSIS — N179 Acute kidney failure, unspecified: Secondary | ICD-10-CM | POA: Diagnosis not present

## 2018-08-01 DIAGNOSIS — I130 Hypertensive heart and chronic kidney disease with heart failure and stage 1 through stage 4 chronic kidney disease, or unspecified chronic kidney disease: Secondary | ICD-10-CM | POA: Diagnosis not present

## 2018-08-01 DIAGNOSIS — N189 Chronic kidney disease, unspecified: Secondary | ICD-10-CM | POA: Diagnosis not present

## 2018-08-02 DIAGNOSIS — I130 Hypertensive heart and chronic kidney disease with heart failure and stage 1 through stage 4 chronic kidney disease, or unspecified chronic kidney disease: Secondary | ICD-10-CM | POA: Diagnosis not present

## 2018-08-02 DIAGNOSIS — E1122 Type 2 diabetes mellitus with diabetic chronic kidney disease: Secondary | ICD-10-CM | POA: Diagnosis not present

## 2018-08-02 DIAGNOSIS — I5042 Chronic combined systolic (congestive) and diastolic (congestive) heart failure: Secondary | ICD-10-CM | POA: Diagnosis not present

## 2018-08-02 DIAGNOSIS — J441 Chronic obstructive pulmonary disease with (acute) exacerbation: Secondary | ICD-10-CM | POA: Diagnosis not present

## 2018-08-02 DIAGNOSIS — N186 End stage renal disease: Secondary | ICD-10-CM | POA: Diagnosis not present

## 2018-08-02 DIAGNOSIS — Z992 Dependence on renal dialysis: Secondary | ICD-10-CM | POA: Diagnosis not present

## 2018-08-03 ENCOUNTER — Inpatient Hospital Stay: Payer: Medicare Other | Admitting: Hematology

## 2018-08-03 ENCOUNTER — Telehealth: Payer: Self-pay | Admitting: *Deleted

## 2018-08-03 ENCOUNTER — Telehealth: Payer: Self-pay | Admitting: Hematology

## 2018-08-03 DIAGNOSIS — D509 Iron deficiency anemia, unspecified: Secondary | ICD-10-CM | POA: Diagnosis not present

## 2018-08-03 DIAGNOSIS — E1122 Type 2 diabetes mellitus with diabetic chronic kidney disease: Secondary | ICD-10-CM | POA: Diagnosis not present

## 2018-08-03 DIAGNOSIS — Z23 Encounter for immunization: Secondary | ICD-10-CM | POA: Diagnosis not present

## 2018-08-03 DIAGNOSIS — J441 Chronic obstructive pulmonary disease with (acute) exacerbation: Secondary | ICD-10-CM | POA: Diagnosis not present

## 2018-08-03 DIAGNOSIS — N183 Chronic kidney disease, stage 3 (moderate): Secondary | ICD-10-CM | POA: Diagnosis not present

## 2018-08-03 DIAGNOSIS — Z992 Dependence on renal dialysis: Secondary | ICD-10-CM | POA: Diagnosis not present

## 2018-08-03 DIAGNOSIS — I130 Hypertensive heart and chronic kidney disease with heart failure and stage 1 through stage 4 chronic kidney disease, or unspecified chronic kidney disease: Secondary | ICD-10-CM | POA: Diagnosis not present

## 2018-08-03 DIAGNOSIS — I5042 Chronic combined systolic (congestive) and diastolic (congestive) heart failure: Secondary | ICD-10-CM | POA: Diagnosis not present

## 2018-08-03 DIAGNOSIS — D649 Anemia, unspecified: Secondary | ICD-10-CM | POA: Diagnosis not present

## 2018-08-03 DIAGNOSIS — N179 Acute kidney failure, unspecified: Secondary | ICD-10-CM | POA: Diagnosis not present

## 2018-08-03 DIAGNOSIS — N189 Chronic kidney disease, unspecified: Secondary | ICD-10-CM | POA: Diagnosis not present

## 2018-08-03 DIAGNOSIS — N186 End stage renal disease: Secondary | ICD-10-CM | POA: Diagnosis not present

## 2018-08-03 NOTE — Telephone Encounter (Signed)
Rescheduled 5/6 appt for 6/25 per sch msg. Called long term facility, spoke with donna. Confirmed date and time of appt

## 2018-08-03 NOTE — Telephone Encounter (Signed)
Nursing supervisor Butch Penny, from [place], called office. Reviewed current [place] 19 restrictions for visitors. Butch Penny states patient can ambulate w/walker for short distances, but uses wheelchair for mobility of any distance and should have someone accompany her. Per Butch Penny, patient goes to dialysis M-W-F at this time, but that dialysis may be temporary for patient and will be able to stop it soon. Butch Penny verbalized understanding of need for patient to f/u w/Dr.Kale, requesting it be in the morning of either a Tuesday or Thursday, in case dialysis continues.  Per Dr. Irene Limbo, make appt in 1-2 months once patient's other conditions have stabilized. Appt msg sent to scheduling for MD appt Thursday June 25, at 10am (best time for [facility] transportation) with Butch Penny, nurse as contact.

## 2018-08-03 NOTE — Telephone Encounter (Signed)
Patient missed appt with Dr. Irene Limbo at [facility] on 5/6 at [facility]. Per note from new patient scheduler on 06/15/2018, patient living at [facility] in [place], contact person is Ramona. Contacted Ramona, advised that patient was discharged to home/LTC with @ [phone].  This number answered by Edd Fabian, states location is [place]. She verified that patient is now resident there and has dialysis every M-W-F; is not available for MD appts on those days. Edd Fabian will give message to call # [phone] regarding appt to nursing supervisor.

## 2018-08-04 DIAGNOSIS — Z992 Dependence on renal dialysis: Secondary | ICD-10-CM | POA: Diagnosis not present

## 2018-08-04 DIAGNOSIS — J441 Chronic obstructive pulmonary disease with (acute) exacerbation: Secondary | ICD-10-CM | POA: Diagnosis not present

## 2018-08-04 DIAGNOSIS — I130 Hypertensive heart and chronic kidney disease with heart failure and stage 1 through stage 4 chronic kidney disease, or unspecified chronic kidney disease: Secondary | ICD-10-CM | POA: Diagnosis not present

## 2018-08-04 DIAGNOSIS — E1122 Type 2 diabetes mellitus with diabetic chronic kidney disease: Secondary | ICD-10-CM | POA: Diagnosis not present

## 2018-08-04 DIAGNOSIS — I5042 Chronic combined systolic (congestive) and diastolic (congestive) heart failure: Secondary | ICD-10-CM | POA: Diagnosis not present

## 2018-08-04 DIAGNOSIS — N186 End stage renal disease: Secondary | ICD-10-CM | POA: Diagnosis not present

## 2018-08-05 DIAGNOSIS — Z23 Encounter for immunization: Secondary | ICD-10-CM | POA: Diagnosis not present

## 2018-08-05 DIAGNOSIS — N179 Acute kidney failure, unspecified: Secondary | ICD-10-CM | POA: Diagnosis not present

## 2018-08-05 DIAGNOSIS — D649 Anemia, unspecified: Secondary | ICD-10-CM | POA: Diagnosis not present

## 2018-08-05 DIAGNOSIS — N183 Chronic kidney disease, stage 3 (moderate): Secondary | ICD-10-CM | POA: Diagnosis not present

## 2018-08-05 DIAGNOSIS — D509 Iron deficiency anemia, unspecified: Secondary | ICD-10-CM | POA: Diagnosis not present

## 2018-08-05 DIAGNOSIS — N189 Chronic kidney disease, unspecified: Secondary | ICD-10-CM | POA: Diagnosis not present

## 2018-08-08 DIAGNOSIS — N189 Chronic kidney disease, unspecified: Secondary | ICD-10-CM | POA: Diagnosis not present

## 2018-08-08 DIAGNOSIS — D509 Iron deficiency anemia, unspecified: Secondary | ICD-10-CM | POA: Diagnosis not present

## 2018-08-08 DIAGNOSIS — Z23 Encounter for immunization: Secondary | ICD-10-CM | POA: Diagnosis not present

## 2018-08-08 DIAGNOSIS — N179 Acute kidney failure, unspecified: Secondary | ICD-10-CM | POA: Diagnosis not present

## 2018-08-08 DIAGNOSIS — N183 Chronic kidney disease, stage 3 (moderate): Secondary | ICD-10-CM | POA: Diagnosis not present

## 2018-08-08 DIAGNOSIS — D649 Anemia, unspecified: Secondary | ICD-10-CM | POA: Diagnosis not present

## 2018-08-09 DIAGNOSIS — Z992 Dependence on renal dialysis: Secondary | ICD-10-CM | POA: Diagnosis not present

## 2018-08-09 DIAGNOSIS — I130 Hypertensive heart and chronic kidney disease with heart failure and stage 1 through stage 4 chronic kidney disease, or unspecified chronic kidney disease: Secondary | ICD-10-CM | POA: Diagnosis not present

## 2018-08-09 DIAGNOSIS — I5042 Chronic combined systolic (congestive) and diastolic (congestive) heart failure: Secondary | ICD-10-CM | POA: Diagnosis not present

## 2018-08-09 DIAGNOSIS — N186 End stage renal disease: Secondary | ICD-10-CM | POA: Diagnosis not present

## 2018-08-09 DIAGNOSIS — J441 Chronic obstructive pulmonary disease with (acute) exacerbation: Secondary | ICD-10-CM | POA: Diagnosis not present

## 2018-08-09 DIAGNOSIS — E1122 Type 2 diabetes mellitus with diabetic chronic kidney disease: Secondary | ICD-10-CM | POA: Diagnosis not present

## 2018-08-10 DIAGNOSIS — N183 Chronic kidney disease, stage 3 (moderate): Secondary | ICD-10-CM | POA: Diagnosis not present

## 2018-08-10 DIAGNOSIS — D649 Anemia, unspecified: Secondary | ICD-10-CM | POA: Diagnosis not present

## 2018-08-10 DIAGNOSIS — N189 Chronic kidney disease, unspecified: Secondary | ICD-10-CM | POA: Diagnosis not present

## 2018-08-10 DIAGNOSIS — Z23 Encounter for immunization: Secondary | ICD-10-CM | POA: Diagnosis not present

## 2018-08-10 DIAGNOSIS — N179 Acute kidney failure, unspecified: Secondary | ICD-10-CM | POA: Diagnosis not present

## 2018-08-10 DIAGNOSIS — D509 Iron deficiency anemia, unspecified: Secondary | ICD-10-CM | POA: Diagnosis not present

## 2018-08-11 DIAGNOSIS — Z992 Dependence on renal dialysis: Secondary | ICD-10-CM | POA: Diagnosis not present

## 2018-08-11 DIAGNOSIS — M79674 Pain in right toe(s): Secondary | ICD-10-CM | POA: Diagnosis not present

## 2018-08-11 DIAGNOSIS — B351 Tinea unguium: Secondary | ICD-10-CM | POA: Diagnosis not present

## 2018-08-11 DIAGNOSIS — I5042 Chronic combined systolic (congestive) and diastolic (congestive) heart failure: Secondary | ICD-10-CM | POA: Diagnosis not present

## 2018-08-11 DIAGNOSIS — N186 End stage renal disease: Secondary | ICD-10-CM | POA: Diagnosis not present

## 2018-08-11 DIAGNOSIS — I130 Hypertensive heart and chronic kidney disease with heart failure and stage 1 through stage 4 chronic kidney disease, or unspecified chronic kidney disease: Secondary | ICD-10-CM | POA: Diagnosis not present

## 2018-08-11 DIAGNOSIS — J441 Chronic obstructive pulmonary disease with (acute) exacerbation: Secondary | ICD-10-CM | POA: Diagnosis not present

## 2018-08-11 DIAGNOSIS — E1122 Type 2 diabetes mellitus with diabetic chronic kidney disease: Secondary | ICD-10-CM | POA: Diagnosis not present

## 2018-08-11 DIAGNOSIS — M79675 Pain in left toe(s): Secondary | ICD-10-CM | POA: Diagnosis not present

## 2018-08-12 DIAGNOSIS — Z23 Encounter for immunization: Secondary | ICD-10-CM | POA: Diagnosis not present

## 2018-08-12 DIAGNOSIS — N183 Chronic kidney disease, stage 3 (moderate): Secondary | ICD-10-CM | POA: Diagnosis not present

## 2018-08-12 DIAGNOSIS — N179 Acute kidney failure, unspecified: Secondary | ICD-10-CM | POA: Diagnosis not present

## 2018-08-12 DIAGNOSIS — D649 Anemia, unspecified: Secondary | ICD-10-CM | POA: Diagnosis not present

## 2018-08-12 DIAGNOSIS — D509 Iron deficiency anemia, unspecified: Secondary | ICD-10-CM | POA: Diagnosis not present

## 2018-08-12 DIAGNOSIS — N189 Chronic kidney disease, unspecified: Secondary | ICD-10-CM | POA: Diagnosis not present

## 2018-08-14 DIAGNOSIS — Z7901 Long term (current) use of anticoagulants: Secondary | ICD-10-CM | POA: Diagnosis not present

## 2018-08-14 DIAGNOSIS — G4733 Obstructive sleep apnea (adult) (pediatric): Secondary | ICD-10-CM | POA: Diagnosis not present

## 2018-08-14 DIAGNOSIS — J441 Chronic obstructive pulmonary disease with (acute) exacerbation: Secondary | ICD-10-CM | POA: Diagnosis not present

## 2018-08-14 DIAGNOSIS — J9611 Chronic respiratory failure with hypoxia: Secondary | ICD-10-CM | POA: Diagnosis not present

## 2018-08-14 DIAGNOSIS — J45909 Unspecified asthma, uncomplicated: Secondary | ICD-10-CM | POA: Diagnosis not present

## 2018-08-14 DIAGNOSIS — E1122 Type 2 diabetes mellitus with diabetic chronic kidney disease: Secondary | ICD-10-CM | POA: Diagnosis not present

## 2018-08-14 DIAGNOSIS — I130 Hypertensive heart and chronic kidney disease with heart failure and stage 1 through stage 4 chronic kidney disease, or unspecified chronic kidney disease: Secondary | ICD-10-CM | POA: Diagnosis not present

## 2018-08-14 DIAGNOSIS — R41841 Cognitive communication deficit: Secondary | ICD-10-CM | POA: Diagnosis not present

## 2018-08-14 DIAGNOSIS — Z794 Long term (current) use of insulin: Secondary | ICD-10-CM | POA: Diagnosis not present

## 2018-08-14 DIAGNOSIS — C7A00 Malignant carcinoid tumor of unspecified site: Secondary | ICD-10-CM | POA: Diagnosis not present

## 2018-08-14 DIAGNOSIS — I5042 Chronic combined systolic (congestive) and diastolic (congestive) heart failure: Secondary | ICD-10-CM | POA: Diagnosis not present

## 2018-08-14 DIAGNOSIS — Z992 Dependence on renal dialysis: Secondary | ICD-10-CM | POA: Diagnosis not present

## 2018-08-14 DIAGNOSIS — I480 Paroxysmal atrial fibrillation: Secondary | ICD-10-CM | POA: Diagnosis not present

## 2018-08-14 DIAGNOSIS — N186 End stage renal disease: Secondary | ICD-10-CM | POA: Diagnosis not present

## 2018-08-14 DIAGNOSIS — R2689 Other abnormalities of gait and mobility: Secondary | ICD-10-CM | POA: Diagnosis not present

## 2018-08-14 DIAGNOSIS — D500 Iron deficiency anemia secondary to blood loss (chronic): Secondary | ICD-10-CM | POA: Diagnosis not present

## 2018-08-15 DIAGNOSIS — Z23 Encounter for immunization: Secondary | ICD-10-CM | POA: Diagnosis not present

## 2018-08-15 DIAGNOSIS — D509 Iron deficiency anemia, unspecified: Secondary | ICD-10-CM | POA: Diagnosis not present

## 2018-08-15 DIAGNOSIS — N183 Chronic kidney disease, stage 3 (moderate): Secondary | ICD-10-CM | POA: Diagnosis not present

## 2018-08-15 DIAGNOSIS — N179 Acute kidney failure, unspecified: Secondary | ICD-10-CM | POA: Diagnosis not present

## 2018-08-15 DIAGNOSIS — N189 Chronic kidney disease, unspecified: Secondary | ICD-10-CM | POA: Diagnosis not present

## 2018-08-15 DIAGNOSIS — D649 Anemia, unspecified: Secondary | ICD-10-CM | POA: Diagnosis not present

## 2018-08-16 DIAGNOSIS — I5042 Chronic combined systolic (congestive) and diastolic (congestive) heart failure: Secondary | ICD-10-CM | POA: Diagnosis not present

## 2018-08-16 DIAGNOSIS — I130 Hypertensive heart and chronic kidney disease with heart failure and stage 1 through stage 4 chronic kidney disease, or unspecified chronic kidney disease: Secondary | ICD-10-CM | POA: Diagnosis not present

## 2018-08-16 DIAGNOSIS — Z992 Dependence on renal dialysis: Secondary | ICD-10-CM | POA: Diagnosis not present

## 2018-08-16 DIAGNOSIS — N186 End stage renal disease: Secondary | ICD-10-CM | POA: Diagnosis not present

## 2018-08-16 DIAGNOSIS — E1122 Type 2 diabetes mellitus with diabetic chronic kidney disease: Secondary | ICD-10-CM | POA: Diagnosis not present

## 2018-08-16 DIAGNOSIS — J441 Chronic obstructive pulmonary disease with (acute) exacerbation: Secondary | ICD-10-CM | POA: Diagnosis not present

## 2018-08-17 ENCOUNTER — Encounter: Payer: Self-pay | Admitting: Vascular Surgery

## 2018-08-17 DIAGNOSIS — D649 Anemia, unspecified: Secondary | ICD-10-CM | POA: Diagnosis not present

## 2018-08-17 DIAGNOSIS — N179 Acute kidney failure, unspecified: Secondary | ICD-10-CM | POA: Diagnosis not present

## 2018-08-17 DIAGNOSIS — D509 Iron deficiency anemia, unspecified: Secondary | ICD-10-CM | POA: Diagnosis not present

## 2018-08-17 DIAGNOSIS — N183 Chronic kidney disease, stage 3 (moderate): Secondary | ICD-10-CM | POA: Diagnosis not present

## 2018-08-17 DIAGNOSIS — Z23 Encounter for immunization: Secondary | ICD-10-CM | POA: Diagnosis not present

## 2018-08-17 DIAGNOSIS — N189 Chronic kidney disease, unspecified: Secondary | ICD-10-CM | POA: Diagnosis not present

## 2018-08-18 DIAGNOSIS — J441 Chronic obstructive pulmonary disease with (acute) exacerbation: Secondary | ICD-10-CM | POA: Diagnosis not present

## 2018-08-18 DIAGNOSIS — Z992 Dependence on renal dialysis: Secondary | ICD-10-CM | POA: Diagnosis not present

## 2018-08-18 DIAGNOSIS — E1122 Type 2 diabetes mellitus with diabetic chronic kidney disease: Secondary | ICD-10-CM | POA: Diagnosis not present

## 2018-08-18 DIAGNOSIS — I5042 Chronic combined systolic (congestive) and diastolic (congestive) heart failure: Secondary | ICD-10-CM | POA: Diagnosis not present

## 2018-08-18 DIAGNOSIS — I130 Hypertensive heart and chronic kidney disease with heart failure and stage 1 through stage 4 chronic kidney disease, or unspecified chronic kidney disease: Secondary | ICD-10-CM | POA: Diagnosis not present

## 2018-08-18 DIAGNOSIS — N186 End stage renal disease: Secondary | ICD-10-CM | POA: Diagnosis not present

## 2018-08-19 ENCOUNTER — Other Ambulatory Visit: Payer: Self-pay

## 2018-08-19 DIAGNOSIS — N183 Chronic kidney disease, stage 3 (moderate): Secondary | ICD-10-CM | POA: Diagnosis not present

## 2018-08-19 DIAGNOSIS — Z23 Encounter for immunization: Secondary | ICD-10-CM | POA: Diagnosis not present

## 2018-08-19 DIAGNOSIS — N189 Chronic kidney disease, unspecified: Secondary | ICD-10-CM | POA: Diagnosis not present

## 2018-08-19 DIAGNOSIS — D649 Anemia, unspecified: Secondary | ICD-10-CM | POA: Diagnosis not present

## 2018-08-19 DIAGNOSIS — D509 Iron deficiency anemia, unspecified: Secondary | ICD-10-CM | POA: Diagnosis not present

## 2018-08-19 DIAGNOSIS — N179 Acute kidney failure, unspecified: Secondary | ICD-10-CM | POA: Diagnosis not present

## 2018-08-22 DIAGNOSIS — D509 Iron deficiency anemia, unspecified: Secondary | ICD-10-CM | POA: Diagnosis not present

## 2018-08-22 DIAGNOSIS — D649 Anemia, unspecified: Secondary | ICD-10-CM | POA: Diagnosis not present

## 2018-08-22 DIAGNOSIS — N183 Chronic kidney disease, stage 3 (moderate): Secondary | ICD-10-CM | POA: Diagnosis not present

## 2018-08-22 DIAGNOSIS — N179 Acute kidney failure, unspecified: Secondary | ICD-10-CM | POA: Diagnosis not present

## 2018-08-22 DIAGNOSIS — N189 Chronic kidney disease, unspecified: Secondary | ICD-10-CM | POA: Diagnosis not present

## 2018-08-22 DIAGNOSIS — Z23 Encounter for immunization: Secondary | ICD-10-CM | POA: Diagnosis not present

## 2018-08-23 ENCOUNTER — Encounter: Payer: Self-pay | Admitting: Vascular Surgery

## 2018-08-23 ENCOUNTER — Other Ambulatory Visit: Payer: Self-pay

## 2018-08-23 ENCOUNTER — Encounter (HOSPITAL_COMMUNITY): Payer: Self-pay | Admitting: *Deleted

## 2018-08-23 ENCOUNTER — Ambulatory Visit (HOSPITAL_COMMUNITY)
Admission: RE | Admit: 2018-08-23 | Discharge: 2018-08-23 | Disposition: A | Discharge status: Home / Self Care | Payer: Medicare Other | Source: Ambulatory Visit | Attending: Vascular Surgery | Admitting: Vascular Surgery

## 2018-08-23 ENCOUNTER — Encounter: Payer: Self-pay | Admitting: *Deleted

## 2018-08-23 ENCOUNTER — Ambulatory Visit (INDEPENDENT_AMBULATORY_CARE_PROVIDER_SITE_OTHER): Payer: Self-pay | Admitting: Vascular Surgery

## 2018-08-23 ENCOUNTER — Other Ambulatory Visit: Payer: Self-pay | Admitting: *Deleted

## 2018-08-23 DIAGNOSIS — N186 End stage renal disease: Secondary | ICD-10-CM

## 2018-08-23 DIAGNOSIS — Z992 Dependence on renal dialysis: Secondary | ICD-10-CM

## 2018-08-23 DIAGNOSIS — N189 Chronic kidney disease, unspecified: Secondary | ICD-10-CM | POA: Diagnosis not present

## 2018-08-23 NOTE — Progress Notes (Signed)
Patient name: [name] MRN: [id number] DOB: 01/02/1956 Sex: female  REASON FOR VISIT: Post-op right brachiocephalic AVF  HPI: KHLOE HUNKELE is a 63 y.o. female with multiple medical problems including hypertension, morbid obesity, A. fib on Eliquis, end-stage renal disease that presents for evaluation of her right brachiocephalic fistula.  She previously underwent fistula placement on 06/06/2018 with a right brachiocephalic.  Patient states this has healed nicely since surgery with no immediate issues.  She did have a fistula duplex today prior to her appointment.  She is currently dialyzing via right IJ catheter.  No right hand pain.  No numbness tingling.  No tissue loss.  Past Medical History:  Diagnosis Date  . Arthritis   . Asthma   . Chronic diastolic CHF (congestive heart failure) ([place])   . CKD (chronic kidney disease), stage III ([place])   . Diabetes mellitus type 2 in obese ([place])   . Hypertension   . Iron deficiency anemia 10/16/2010  . Mental handicap 10/16/2010  . Mild CAD 2013  . Morbid obesity ([place])   . OSA (obstructive sleep apnea)     Past Surgical History:  Procedure Laterality Date  . ABDOMINAL HYSTERECTOMY    . AV FISTULA PLACEMENT Right 06/06/2018   Procedure: ARTERIOVENOUS (AV) FISTULA CREATION;  Surgeon: Marty Heck, MD;  Location: [facility];  Service: Vascular;  Laterality: Right;  . CESAREAN SECTION    . CHOLECYSTECTOMY    . COLONOSCOPY  08/2010   normal TI, sigmoid polyp (adenoma ). Next TCS due  08/2015,  . ESOPHAGOGASTRODUODENOSCOPY  08/2010   antral and duodenal erosions s/p bx (chronic gastritis, no h.pylori, no celiac dz ), hiatal hernia  . IR FLUORO GUIDE CV LINE RIGHT  05/31/2018  . IR US GUIDE VASC ACCESS RIGHT  05/31/2018  . KNEE SURGERY     right knee @ 63 years of age  . LEFT AND RIGHT HEART CATHETERIZATION WITH CORONARY ANGIOGRAM N/A 09/21/2011   Procedure: LEFT AND RIGHT HEART CATHETERIZATION WITH CORONARY ANGIOGRAM;  Surgeon: Birdie Riddle,  MD;  Location: [facility] CATH LAB;  Service: Cardiovascular;  Laterality: N/A;    Family History  Problem Relation Age of Onset  . Diabetes Mother   . Hypertension Mother   . Heart failure Mother   . Hypertension Father   . Atrial fibrillation Father   . Kidney disease Other        son reportedly has had cyst on kidney and lung s/p surgery at [facility]  . Hypertension Son   . Colon cancer Neg Hx     SOCIAL HISTORY: Social History   Tobacco Use  . Smoking status: Former Smoker    Packs/day: 1.00    Years: 0.00    Pack years: 0.00    Types: Cigarettes    Last attempt to quit: 04/11/1996    Years since quitting: 22.3  . Smokeless tobacco: Never Used  . Tobacco comment: quit a couple year ago  Substance Use Topics  . Alcohol use: No    No Known Allergies  Current Outpatient Medications  Medication Sig Dispense Refill  . ACCU-CHEK AVIVA PLUS test strip USE TO TEST THREE TIMES DAILY. 100 each 5  . ACCU-CHEK SOFTCLIX LANCETS lancets USE TO TEST THREE TIMES DAILY. 100 each 5  . acetaminophen (TYLENOL) 500 MG tablet Take 500 mg by mouth 2 (two) times daily as needed for mild pain.    Marland Kitchen albuterol (PROVENTIL HFA;VENTOLIN HFA) 108 (90 Base) MCG/ACT inhaler Inhale 2 puffs  into the lungs every 6 (six) hours as needed for shortness of breath.    Marland Kitchen aspirin EC 81 MG tablet Take 81 mg by mouth daily.    Marland Kitchen diltiazem (CARDIZEM CD) 240 MG 24 hr capsule Take 1 capsule (240 mg total) by mouth daily. 30 capsule 0  . Diphenhyd-Hydrocort-Nystatin (FIRST-DUKES MOUTHWASH) SUSP Use as directed 5 mLs in the mouth or throat 4 (four) times daily. Swish and spit    . fluticasone (VERAMYST) 27.5 MCG/SPRAY nasal spray Place 2 sprays into the nose daily.    . Fluticasone-Umeclidin-Vilant (TRELEGY ELLIPTA) 100-62.5-25 MCG/INH AEPB Inhale into the lungs daily.    Marland Kitchen guaiFENesin (ROBITUSSIN) 100 MG/5ML SOLN Take 5 mLs (100 mg total) by mouth every 4 (four) hours as needed for cough or to loosen phlegm. 236 mL 0  .  insulin regular human CONCENTRATED (HUMULIN R U-500 KWIKPEN) 500 UNIT/ML kwikpen Inject 20 Units into the skin 3 (three) times daily with meals. Inject 20 units with breakfast, 20 units with lunch, and 20 units with supper when blood glucose before food is greater than 90 mg/dL 4 pen 2  . ipratropium-albuterol (DUONEB) 0.5-2.5 (3) MG/3ML SOLN Take 3 mLs by nebulization every 6 (six) hours as needed (shortness of breath).    . isosorbide dinitrate (ISORDIL) 10 MG tablet Take 1 tablet (10 mg total) by mouth 3 (three) times daily. 90 tablet 0  . loratadine (CLARITIN) 10 MG tablet Take 10 mg by mouth daily.    . metoprolol succinate (TOPROL-XL) 100 MG 24 hr tablet Take 1 tablet (100 mg total) by mouth daily. Take with or immediately following a meal. 30 tablet 0  . NOVOFINE AUTOCOVER 30G X 8 MM MISC USE AS DIRECTED FOR 3 TIMES DAILY INSULIN ADMINISTRATION. 100 each 3  . SURE COMFORT PEN NEEDLES 31G X 8 MM MISC USE AS DIRECTED WITH LEVEMIR AND NOVOLOG. UP TO FOUR TIMES DAILY. 100 each 5  . apixaban (ELIQUIS) 5 MG TABS tablet Take 1 tablet (5 mg total) by mouth 2 (two) times daily. (Patient not taking: Reported on 08/23/2018) 60 tablet 0  . hydrALAZINE (APRESOLINE) 25 MG tablet Take 1 tablet (25 mg total) by mouth every 8 (eight) hours for 30 days. 90 tablet 0   No current facility-administered medications for this visit.     REVIEW OF SYSTEMS:  [X]  denotes positive finding, [ ]  denotes negative finding Cardiac  Comments:  Chest pain or chest pressure:    Shortness of breath upon exertion:    Short of breath when lying flat:    Irregular heart rhythm:        Vascular    Pain in calf, thigh, or hip brought on by ambulation:    Pain in feet at night that wakes you up from your sleep:     Blood clot in your veins:    Leg swelling:         Pulmonary    Oxygen at home:    Productive cough:     Wheezing:         Neurologic    Sudden weakness in arms or legs:     Sudden numbness in arms or legs:      Sudden onset of difficulty speaking or slurred speech:    Temporary loss of vision in one eye:     Problems with dizziness:         Gastrointestinal    Blood in stool:     Vomited blood:  Genitourinary    Burning when urinating:     Blood in urine:        Psychiatric    Major depression:         Hematologic    Bleeding problems:    Problems with blood clotting too easily:        Skin    Rashes or ulcers:        Constitutional    Fever or chills:      PHYSICAL EXAM: Vitals:   08/23/18 1439  BP: 116/67  Pulse: 76  Resp: 14  Temp: 98.1 F (36.7 C)  TempSrc: Oral  SpO2: 98%  Height: 5\' 3"  (1.6 m)    GENERAL: The patient is a well-nourished female, in no acute distress. The vital signs are documented above. CARDIAC: There is a regular rate and rhythm.  VASCULAR:  1+ right radial pulse palpable Good appreciable thrill in right brachiocephalic AVF Incision well healed.  DATA:   I independently reviewed her fistula duplex and the fistula is maturing nicely from a diameter standpoint but remains deep throughout its course greater than 1 to 1-1/2 cm.  There is also concern for a competing branch with a partially occlusive thrombus in the distal upper arm (2-3 cm from anastomosis).  Assessment/Plan:  Discussed with Ms. [place] that would plan for right upper extremity fistula revision with superficialization given its fairly deep throughout its course.  I could appreciate a thrill near the anastomosis but it is difficult to feel a thrill in the upper arm given its depth.  We will schedule on Thursday which is a nondialysis day for her.  Discussed holding her Eliquis at this time and we can restart that after surgery.  Will plan for sidebranch ligation note on duplex as well and can evaluate partially occlusive thrombus again in OR with [place] and decide if this needs intervention.     Marty Heck, MD [facility] [place] Office:  [phone] Pager: [place]

## 2018-08-23 NOTE — Progress Notes (Addendum)
Pt is a resident at [facility]. Spoke with Charleston Ropes, Med tech. She verified pt's DOB for me. She states pt has not had any recent chest pain or sob. Pt is alert, oriented and can speak for herself. Pt is a type 2 Diabetic. Daisy states she's not sure about pt's recent A1C. She is faxing me her fasting blood sugars and copy of the MAR. I have instructed Daisy to have first shift tomorrow call pt's PCP and get instructions on adjusting pt's U 500 insulin and to follow those instructions. Pt is on Eliquis for Atrial fib and Dr. Johnsie Cancel is her cardiologist. Dr. Carlis Abbott instructed pt to hold her Eliquis as of today prior to surgery. Last dose was this AM.  Charleston Ropes states that they have not had any residents with Covid 19. Pt is to be tested in the AM. Asked Daisy to have pt quarantine in her room as of now until surgery and to make sure that the Covid 19 test is done after her dialysis. She voiced understanding.  Faxed pre-op instructions to [phone], attention Daisy. Faxed MAR to pharmacy.   Coronavirus Screening  Have you experienced the following symptoms:  Cough NO Fever (>100.52F)  NO Runny nose NO Sore throat NO Difficulty breathing/shortness of breath  NO  Have you or a family member traveled in the last 14 days and where? NO   Patient reminded that hospital visitation restrictions are in effect and the importance of the restrictions.

## 2018-08-23 NOTE — Pre-Procedure Instructions (Signed)
   PORCHA DEBLANC  08/23/2018    Ms Cregg's procedure is scheduled on Thursday, 08/25/2018 at 12:05 PM.   Report to [facility] Entrance "A" Admitting Office at 9:30 AM.   Call this number if you have problems the morning of surgery: [phone]   Remember:  Patient is not to eat or drink after midnight Wednesday, 08/24/18.  Take these medicines the morning of surgery with A SIP OF WATER: Aspirin, Diltiazem (Cardizem), Hydralazine (Apresoline), Isosorbide Dinitril (Isordil), Loratadine (Claritin), Metoprolol (Toprol XL), Tylenol - prn, Trelegy Ellipta inhaler, Fluticasone (Veramyst) Albuterol inhaler - prn (have patient bring this inhaler with her day of surgery).  Please call patient's PCP and ask how to adjust her U-500 concentrated insulin prior to surgery and follow those instructions.  Please check patient's blood sugar when she gets up and every 2 hours until she leaves for the hospital. If blood sugar is 70 or below, treat with 1/2 cup of clear juice (apple or cranberry) and recheck blood sugar 15 minutes after drinking juice. If blood sugar continues to be 70 or below, call the Short Stay department and ask to speak to a nurse.   Do not wear jewelry, make-up or nail polish.  Do not wear lotions, powders, perfumes or deodorant.  Do not shave 48 hours prior to surgery.    Do not bring valuables to the hospital.  [facility] is not responsible for any belongings or valuables.  Contacts, dentures or bridgework may not be worn into surgery.  Leave your suitcase in the car.  After surgery it may be brought to your room.  Patients discharged the day of surgery will not be allowed to drive home.

## 2018-08-24 ENCOUNTER — Telehealth: Payer: Self-pay

## 2018-08-24 ENCOUNTER — Encounter: Payer: Self-pay | Admitting: *Deleted

## 2018-08-24 DIAGNOSIS — D509 Iron deficiency anemia, unspecified: Secondary | ICD-10-CM | POA: Diagnosis not present

## 2018-08-24 DIAGNOSIS — N189 Chronic kidney disease, unspecified: Secondary | ICD-10-CM | POA: Diagnosis not present

## 2018-08-24 DIAGNOSIS — D649 Anemia, unspecified: Secondary | ICD-10-CM | POA: Diagnosis not present

## 2018-08-24 DIAGNOSIS — Z23 Encounter for immunization: Secondary | ICD-10-CM | POA: Diagnosis not present

## 2018-08-24 DIAGNOSIS — N179 Acute kidney failure, unspecified: Secondary | ICD-10-CM | POA: Diagnosis not present

## 2018-08-24 DIAGNOSIS — N183 Chronic kidney disease, stage 3 (moderate): Secondary | ICD-10-CM | POA: Diagnosis not present

## 2018-08-24 NOTE — Telephone Encounter (Signed)
She can skip the insulin for the missed meal and resume regular doses when she is clear to eat.

## 2018-08-24 NOTE — Anesthesia Preprocedure Evaluation (Addendum)
Anesthesia Evaluation  Patient identified by MRN, date of birth, ID band Patient awake    Reviewed: Allergy & Precautions, NPO status , Patient's Chart, lab work & pertinent test results, reviewed documented beta blocker date and time   Airway Mallampati: II  TM Distance: >3 FB Neck ROM: full    Dental  (+) Teeth Intact, Dental Advidsory Given, Missing,    Pulmonary asthma , sleep apnea , former smoker,    Pulmonary exam normal breath sounds clear to auscultation       Cardiovascular hypertension, Pt. on home beta blockers and Pt. on medications + CAD and +CHF  Normal cardiovascular exam+ dysrhythmias Atrial Fibrillation  Rhythm:regular Rate:Normal  ECG: A-fib with RVR   Neuro/Psych PSYCHIATRIC DISORDERS Mental handicap    GI/Hepatic negative GI ROS, Neg liver ROS,   Endo/Other  diabetes, Type 1, Insulin Dependent  Renal/GU ESRFRenal disease     Musculoskeletal negative musculoskeletal ROS (+)   Abdominal   Peds  Hematology  (+) Blood dyscrasia, anemia ,   Anesthesia Other Findings   Reproductive/Obstetrics                          Anesthesia Physical Anesthesia Plan  ASA: IV  Anesthesia Plan: General   Post-op Pain Management:    Induction: Intravenous  PONV Risk Score and Plan: 3 and Ondansetron, Dexamethasone and Treatment may vary due to age or medical condition  Airway Management Planned: LMA  Additional Equipment:   Intra-op Plan:   Post-operative Plan: Extubation in OR  Informed Consent: I have reviewed the patients History and Physical, chart, labs and discussed the procedure including the risks, benefits and alternatives for the proposed anesthesia with the patient or authorized representative who has indicated his/her understanding and acceptance.     Dental advisory given and Dental Advisory Given  Plan Discussed with: CRNA  Anesthesia Plan Comments: (Per JB  PA-C: Recently admitted 2/22-3/12/20. Presented from LTC to [facility] with reported one day of respiratory distress.On arrival in the ER, she was found to be in V. tach. Patient was given an amiodarone bolus and then developed unstable wide-complex tachycardia and acute respiratory failure. Patient was admitted by PCCM to ICU and intubated (extubated 05/27/18). She was treated for aspiration pneumonitis, acute hypoxemic respiratory failure, wide-complex tachycardia, hyponatremia, hyperkalemia K+ 7.5), AKI on CKD stage III.  She was started on CRRT after nephrology consultation. During hospitalization she underwent tunneled dialysis catheter and AV fistula placement for hemodialysis.   Echo 05/22/18 showed EF >65%, impaired diastolic relaxation, mild calcification of AV, small mobile density of the ventricular surface of aortic valve which was challenging to visualized. TEE showed this was a calcified, heterogeneous, primarily immobile lesion on the left coronary cusp with appearance of subacute or healed vegetation vs calcified nodule. There was no LAA thrombus. LA size was normal. Infectious disease was consulted and in the setting of normal blood cultures, they did not feel this represented infective endocarditis. She also had bronchoscopy to evaluate her LLL mass with pathology revealing typical carcinoid, pending oncology evaluation. She has since followed up with cardiology 06/30/18 and was doing well at that time. )    Anesthesia Quick Evaluation

## 2018-08-24 NOTE — Telephone Encounter (Signed)
[name] from [facility] is calling stating that [name] is having surgery in the morning she is having a fistula inserted in her right arm and she will need to remain NPO after midnight tonight and [name] is asking about insulin instructions, Please advise?

## 2018-08-24 NOTE — Telephone Encounter (Signed)
Butch Penny at [facility] is aware

## 2018-08-25 ENCOUNTER — Ambulatory Visit (HOSPITAL_COMMUNITY)
Admission: RE | Admit: 2018-08-25 | Discharge: 2018-08-25 | Disposition: A | Discharge status: Home / Self Care | Payer: Medicare Other | Attending: Vascular Surgery | Admitting: Vascular Surgery

## 2018-08-25 ENCOUNTER — Ambulatory Visit (HOSPITAL_COMMUNITY): Payer: Medicare Other | Admitting: Physician Assistant

## 2018-08-25 ENCOUNTER — Other Ambulatory Visit: Payer: Self-pay

## 2018-08-25 ENCOUNTER — Encounter (HOSPITAL_COMMUNITY): Payer: Self-pay

## 2018-08-25 ENCOUNTER — Encounter (HOSPITAL_COMMUNITY)
Admission: RE | Disposition: A | Discharge status: Home / Self Care | Payer: Self-pay | Source: Home / Self Care | Attending: Vascular Surgery

## 2018-08-25 DIAGNOSIS — Z6841 Body Mass Index (BMI) 40.0 and over, adult: Secondary | ICD-10-CM | POA: Insufficient documentation

## 2018-08-25 DIAGNOSIS — Z79899 Other long term (current) drug therapy: Secondary | ICD-10-CM | POA: Diagnosis not present

## 2018-08-25 DIAGNOSIS — E669 Obesity, unspecified: Secondary | ICD-10-CM | POA: Insufficient documentation

## 2018-08-25 DIAGNOSIS — Z7982 Long term (current) use of aspirin: Secondary | ICD-10-CM | POA: Insufficient documentation

## 2018-08-25 DIAGNOSIS — D509 Iron deficiency anemia, unspecified: Secondary | ICD-10-CM | POA: Insufficient documentation

## 2018-08-25 DIAGNOSIS — Z7901 Long term (current) use of anticoagulants: Secondary | ICD-10-CM | POA: Insufficient documentation

## 2018-08-25 DIAGNOSIS — I5032 Chronic diastolic (congestive) heart failure: Secondary | ICD-10-CM | POA: Insufficient documentation

## 2018-08-25 DIAGNOSIS — M1990 Unspecified osteoarthritis, unspecified site: Secondary | ICD-10-CM | POA: Insufficient documentation

## 2018-08-25 DIAGNOSIS — Z87891 Personal history of nicotine dependence: Secondary | ICD-10-CM | POA: Insufficient documentation

## 2018-08-25 DIAGNOSIS — Z1159 Encounter for screening for other viral diseases: Secondary | ICD-10-CM | POA: Diagnosis not present

## 2018-08-25 DIAGNOSIS — I2510 Atherosclerotic heart disease of native coronary artery without angina pectoris: Secondary | ICD-10-CM | POA: Diagnosis not present

## 2018-08-25 DIAGNOSIS — I482 Chronic atrial fibrillation, unspecified: Secondary | ICD-10-CM | POA: Diagnosis not present

## 2018-08-25 DIAGNOSIS — G4733 Obstructive sleep apnea (adult) (pediatric): Secondary | ICD-10-CM | POA: Insufficient documentation

## 2018-08-25 DIAGNOSIS — D759 Disease of blood and blood-forming organs, unspecified: Secondary | ICD-10-CM | POA: Insufficient documentation

## 2018-08-25 DIAGNOSIS — N186 End stage renal disease: Secondary | ICD-10-CM | POA: Insufficient documentation

## 2018-08-25 DIAGNOSIS — Z992 Dependence on renal dialysis: Secondary | ICD-10-CM | POA: Diagnosis not present

## 2018-08-25 DIAGNOSIS — Z794 Long term (current) use of insulin: Secondary | ICD-10-CM | POA: Diagnosis not present

## 2018-08-25 DIAGNOSIS — E1122 Type 2 diabetes mellitus with diabetic chronic kidney disease: Secondary | ICD-10-CM | POA: Insufficient documentation

## 2018-08-25 DIAGNOSIS — J45909 Unspecified asthma, uncomplicated: Secondary | ICD-10-CM | POA: Diagnosis not present

## 2018-08-25 DIAGNOSIS — I132 Hypertensive heart and chronic kidney disease with heart failure and with stage 5 chronic kidney disease, or end stage renal disease: Secondary | ICD-10-CM | POA: Diagnosis not present

## 2018-08-25 DIAGNOSIS — I770 Arteriovenous fistula, acquired: Secondary | ICD-10-CM | POA: Diagnosis not present

## 2018-08-25 HISTORY — DX: Unspecified atrial fibrillation: I48.91

## 2018-08-25 HISTORY — PX: FISTULA SUPERFICIALIZATION: SHX6341

## 2018-08-25 LAB — POCT I-STAT 4, (NA,K, GLUC, HGB,HCT)
Glucose, Bld: 180 mg/dL — ABNORMAL HIGH (ref 70–99)
HCT: 35 % — ABNORMAL LOW (ref 36.0–46.0)
Hemoglobin: 11.9 g/dL — ABNORMAL LOW (ref 12.0–15.0)
Potassium: 4.7 mmol/L (ref 3.5–5.1)
Sodium: 132 mmol/L — ABNORMAL LOW (ref 135–145)

## 2018-08-25 LAB — GLUCOSE, CAPILLARY
Glucose-Capillary: 173 mg/dL — ABNORMAL HIGH (ref 70–99)
Glucose-Capillary: 164 mg/dL — ABNORMAL HIGH (ref 70–99)

## 2018-08-25 LAB — SARS CORONAVIRUS 2 BY RT PCR (HOSPITAL ORDER, PERFORMED IN ~~LOC~~ HOSPITAL LAB): SARS Coronavirus 2: NEGATIVE

## 2018-08-25 SURGERY — FISTULA SUPERFICIALIZATION
Anesthesia: General | Site: Arm Upper | Laterality: Right

## 2018-08-25 MED ORDER — SODIUM CHLORIDE 0.9 % IV SOLN
INTRAVENOUS | Status: DC
  Administered 2018-08-25: 10:00:00 via INTRAVENOUS

## 2018-08-25 MED ORDER — DEXAMETHASONE SODIUM PHOSPHATE 10 MG/ML IJ SOLN
INTRAMUSCULAR | Status: DC | PRN
  Administered 2018-08-25: 5 mg via INTRAVENOUS

## 2018-08-25 MED ORDER — METOPROLOL TARTRATE 50 MG PO TABS
ORAL_TABLET | ORAL | Status: AC
  Filled 2018-08-25: qty 2

## 2018-08-25 MED ORDER — CEFAZOLIN SODIUM-DEXTROSE 2-4 GM/100ML-% IV SOLN
INTRAVENOUS | Status: AC
  Filled 2018-08-25: qty 100

## 2018-08-25 MED ORDER — SODIUM CHLORIDE 0.9 % IV SOLN
INTRAVENOUS | Status: DC | PRN
  Administered 2018-08-25: 10:00:00

## 2018-08-25 MED ORDER — SODIUM CHLORIDE 0.9 % IV SOLN
INTRAVENOUS | Status: AC
  Filled 2018-08-25: qty 1.2

## 2018-08-25 MED ORDER — FENTANYL CITRATE (PF) 100 MCG/2ML IJ SOLN
25.0000 ug | INTRAMUSCULAR | Status: DC | PRN
  Administered 2018-08-25: 50 ug via INTRAVENOUS

## 2018-08-25 MED ORDER — FENTANYL CITRATE (PF) 100 MCG/2ML IJ SOLN
INTRAMUSCULAR | Status: DC | PRN
  Administered 2018-08-25 (×4): 25 ug via INTRAVENOUS

## 2018-08-25 MED ORDER — ONDANSETRON HCL 4 MG/2ML IJ SOLN: 4.0000 mg | Freq: Once | INTRAMUSCULAR | Status: DC | PRN

## 2018-08-25 MED ORDER — FENTANYL CITRATE (PF) 100 MCG/2ML IJ SOLN
INTRAMUSCULAR | Status: AC
  Filled 2018-08-25: qty 2

## 2018-08-25 MED ORDER — OXYCODONE-ACETAMINOPHEN 5-325 MG PO TABS: 1.0000 | ORAL_TABLET | Freq: Four times a day (QID) | ORAL | 0 refills | Status: DC | PRN

## 2018-08-25 MED ORDER — LIDOCAINE 2% (20 MG/ML) 5 ML SYRINGE
INTRAMUSCULAR | Status: AC
  Filled 2018-08-25: qty 5

## 2018-08-25 MED ORDER — DEXAMETHASONE SODIUM PHOSPHATE 10 MG/ML IJ SOLN
INTRAMUSCULAR | Status: AC
  Filled 2018-08-25: qty 1

## 2018-08-25 MED ORDER — METOPROLOL TARTRATE 50 MG PO TABS
100.0000 mg | ORAL_TABLET | Freq: Once | ORAL | Status: AC
  Administered 2018-08-25: 100 mg via ORAL

## 2018-08-25 MED ORDER — FENTANYL CITRATE (PF) 250 MCG/5ML IJ SOLN
INTRAMUSCULAR | Status: AC
  Filled 2018-08-25: qty 5

## 2018-08-25 MED ORDER — PROPOFOL 10 MG/ML IV BOLUS
INTRAVENOUS | Status: DC | PRN
  Administered 2018-08-25 (×2): 100 mg via INTRAVENOUS

## 2018-08-25 MED ORDER — PROPOFOL 10 MG/ML IV BOLUS
INTRAVENOUS | Status: AC
  Filled 2018-08-25: qty 20

## 2018-08-25 MED ORDER — MIDAZOLAM HCL 5 MG/5ML IJ SOLN
INTRAMUSCULAR | Status: DC | PRN
  Administered 2018-08-25: 2 mg via INTRAVENOUS

## 2018-08-25 MED ORDER — 0.9 % SODIUM CHLORIDE (POUR BTL) OPTIME
TOPICAL | Status: DC | PRN
  Administered 2018-08-25: 1000 mL

## 2018-08-25 MED ORDER — MIDAZOLAM HCL 2 MG/2ML IJ SOLN
INTRAMUSCULAR | Status: AC
  Filled 2018-08-25: qty 2

## 2018-08-25 MED ORDER — CEFAZOLIN SODIUM-DEXTROSE 2-4 GM/100ML-% IV SOLN
2.0000 g | INTRAVENOUS | Status: AC
  Administered 2018-08-25: 2 g via INTRAVENOUS

## 2018-08-25 MED ORDER — ACETAMINOPHEN 500 MG PO TABS
1000.0000 mg | ORAL_TABLET | Freq: Once | ORAL | Status: AC
  Administered 2018-08-25: 1000 mg via ORAL

## 2018-08-25 MED ORDER — LIDOCAINE 2% (20 MG/ML) 5 ML SYRINGE
INTRAMUSCULAR | Status: DC | PRN
  Administered 2018-08-25: 60 mg via INTRAVENOUS

## 2018-08-25 MED ORDER — ONDANSETRON HCL 4 MG/2ML IJ SOLN
INTRAMUSCULAR | Status: AC
  Filled 2018-08-25: qty 2

## 2018-08-25 MED ORDER — ACETAMINOPHEN 500 MG PO TABS
ORAL_TABLET | ORAL | Status: AC
  Filled 2018-08-25: qty 2

## 2018-08-25 MED ORDER — LIDOCAINE HCL (PF) 1 % IJ SOLN
INTRAMUSCULAR | Status: AC
  Filled 2018-08-25: qty 30

## 2018-08-25 SURGICAL SUPPLY — 41 items
ADH SKN CLS APL DERMABOND .7 (GAUZE/BANDAGES/DRESSINGS) ×1
AGENT HMST SPONGE THK3/8 (HEMOSTASIS)
ARMBAND PINK RESTRICT EXTREMIT (MISCELLANEOUS) ×2 IMPLANT
BIOPATCH RED 1 DISK 7.0 (GAUZE/BANDAGES/DRESSINGS) ×1 IMPLANT
CANISTER SUCT 3000ML PPV (MISCELLANEOUS) ×2 IMPLANT
CLIP VESOCCLUDE MED 6/CT (CLIP) ×2 IMPLANT
CLIP VESOCCLUDE SM WIDE 6/CT (CLIP) ×2 IMPLANT
COVER PROBE W GEL 5X96 (DRAPES) IMPLANT
COVER WAND RF STERILE (DRAPES) ×2 IMPLANT
DECANTER SPIKE VIAL GLASS SM (MISCELLANEOUS) ×2 IMPLANT
DERMABOND ADVANCED (GAUZE/BANDAGES/DRESSINGS) ×1
DERMABOND ADVANCED .7 DNX12 (GAUZE/BANDAGES/DRESSINGS) ×1 IMPLANT
DRESSING OPSITE X SMALL 2X3 (GAUZE/BANDAGES/DRESSINGS) ×1 IMPLANT
ELECT REM PT RETURN 9FT ADLT (ELECTROSURGICAL) ×2
ELECTRODE REM PT RTRN 9FT ADLT (ELECTROSURGICAL) ×1 IMPLANT
GLOVE BIO SURGEON STRL SZ 6.5 (GLOVE) ×4 IMPLANT
GLOVE BIO SURGEON STRL SZ7.5 (GLOVE) ×2 IMPLANT
GLOVE BIOGEL PI IND STRL 7.5 (GLOVE) IMPLANT
GLOVE BIOGEL PI IND STRL 8 (GLOVE) ×1 IMPLANT
GLOVE BIOGEL PI INDICATOR 7.5 (GLOVE) ×1
GLOVE BIOGEL PI INDICATOR 8 (GLOVE) ×1
GLOVE ECLIPSE 7.0 STRL STRAW (GLOVE) ×1 IMPLANT
GOWN STRL REUS W/ TWL LRG LVL3 (GOWN DISPOSABLE) ×2 IMPLANT
GOWN STRL REUS W/ TWL XL LVL3 (GOWN DISPOSABLE) ×2 IMPLANT
GOWN STRL REUS W/TWL LRG LVL3 (GOWN DISPOSABLE) ×4
GOWN STRL REUS W/TWL XL LVL3 (GOWN DISPOSABLE) ×4
HEMOSTAT SPONGE AVITENE ULTRA (HEMOSTASIS) IMPLANT
KIT BASIN OR (CUSTOM PROCEDURE TRAY) ×2 IMPLANT
KIT TURNOVER KIT B (KITS) ×2 IMPLANT
NS IRRIG 1000ML POUR BTL (IV SOLUTION) ×2 IMPLANT
PACK CV ACCESS (CUSTOM PROCEDURE TRAY) ×2 IMPLANT
PAD ARMBOARD 7.5X6 YLW CONV (MISCELLANEOUS) ×4 IMPLANT
SUT MNCRL AB 4-0 PS2 18 (SUTURE) ×3 IMPLANT
SUT PROLENE 6 0 BV (SUTURE) IMPLANT
SUT PROLENE 7 0 BV 1 (SUTURE) ×2 IMPLANT
SUT SILK 2 0 SH (SUTURE) ×1 IMPLANT
SUT VIC AB 3-0 SH 27 (SUTURE) ×4
SUT VIC AB 3-0 SH 27X BRD (SUTURE) ×2 IMPLANT
TOWEL GREEN STERILE (TOWEL DISPOSABLE) ×2 IMPLANT
UNDERPAD 30X30 (UNDERPADS AND DIAPERS) ×2 IMPLANT
WATER STERILE IRR 1000ML POUR (IV SOLUTION) ×2 IMPLANT

## 2018-08-25 NOTE — Op Note (Signed)
    Patient name: [name] MRN: [id number] DOB: November 16, 1955 Sex: female  08/25/2018 Pre-operative Diagnosis: End-stage renal disease Post-operative diagnosis:  Same Surgeon:  Erlene Quan C. Donzetta Matters, MD Assistant: Laurence Slate, PA Procedure Performed:  Right arm cephalic vein fistula transposition  Indications: 63 year old female with end-stage renal disease currently on dialysis via catheter.  She has a previous cephalic vein fistula that has matured but is unfortunately too deep for cannulation.  She is now indicated for transposition versus super fistulization.  Findings: Vein in several areas measured 1 cm diameter.  After transposition there was a strong thrill in the vein as well as palpable radial pulse the wrist.   Procedure:  The patient was identified in the holding area and taken to the operating room where she is placed upon the operative table and general anesthesia induced.  She was sterilely prepped and draped in the right upper extremity usual fashion, antibiotics were administered and a timeout was called.  Ultrasound was initially used to identify the fistula as well as the branches that were there.  2 separate incisions were made one towards the antecubitum 1 towards the axilla.  We dissected down to the fistula.  We connected the incisions subcutaneously.  Branches were taken between clips and ties.  Given that there was some tortuosity I elected to transpose this.  I marked her for orientation.  Was clamped towards the antecubitum and transected.  Was tunneled laterally flushed with heparinized saline.  I then sewed end-to-end with 6-0 Prolene suture.  Prior to completion anastomosis well flushing all directions.  Upon completion there was strong thrill in the fistula.  We freed up some of the soft tissue around the fistula allowed to sit closer to the skin.  We irrigated the wounds obtain hemostasis.  In the more cephalad incision we closed some deep tissue with interrupted Vicryl  closed the skin with Monocryl.  In the more peripheral incision towards the antecubitum this was closed with 4 Monocryl only overlying the fistula.  Dermabond placed to level the skin.  She was then awakened anesthesia having tolerated procedure without immediate complication.  All counts were correct at completion.  EBL 50 cc   Dacari Beckstrand C. Donzetta Matters, MD [facility] [place] Office: [phone] Pager: [phone]

## 2018-08-25 NOTE — Anesthesia Procedure Notes (Signed)
Procedure Name: LMA Insertion Date/Time: 08/25/2018 10:02 AM Performed by: Neldon Newport, CRNA Pre-anesthesia Checklist: Timeout performed, Patient being monitored, Suction available, Emergency Drugs available and Patient identified Patient Re-evaluated:Patient Re-evaluated prior to induction Oxygen Delivery Method: Circle system utilized Preoxygenation: Pre-oxygenation with 100% oxygen Induction Type: IV induction Ventilation: Mask ventilation without difficulty LMA: LMA inserted LMA Size: 4.0 Number of attempts: 1 Placement Confirmation: breath sounds checked- equal and bilateral,  positive ETCO2 and ETT inserted through vocal cords under direct vision Tube secured with: Tape Dental Injury: Teeth and Oropharynx as per pre-operative assessment

## 2018-08-25 NOTE — Anesthesia Postprocedure Evaluation (Signed)
Anesthesia Post Note  Patient: [name]  Procedure(s) Performed: FISTULA SUPERFICIALIZATION RIGHT ARM (Right Arm Upper)     Patient location during evaluation: PACU Anesthesia Type: General Level of consciousness: awake and alert Pain management: pain level controlled Vital Signs Assessment: post-procedure vital signs reviewed and stable Respiratory status: spontaneous breathing, nonlabored ventilation, respiratory function stable and patient connected to nasal cannula oxygen Cardiovascular status: blood pressure returned to baseline and stable Postop Assessment: no apparent nausea or vomiting Anesthetic complications: no    Last Vitals:  Vitals:   08/25/18 1126 08/25/18 1141  BP: 134/82 (!) 142/72  Pulse: (!) 59 (!) 57  Resp: 12 11  Temp:    SpO2: 98% 95%    Last Pain:  Vitals:   08/25/18 1141  TempSrc:   PainSc: 4                  Mercy Leppla P Samul Mcinroy

## 2018-08-25 NOTE — H&P (Signed)
   History and Physical Update  The patient was interviewed and re-examined.  The patient's previous History and Physical has been reviewed and is unchanged from recent office visit. Plan for right arm avf superficialization vs transposition.  [name] C. Donzetta Matters, MD [facility] [place] Office: [phone] Pager: [phone]   08/25/2018, 9:21 AM

## 2018-08-25 NOTE — Transfer of Care (Signed)
Immediate Anesthesia Transfer of Care Note  Patient: [name]  Procedure(s) Performed: FISTULA SUPERFICIALIZATION RIGHT ARM (Right Arm Upper)  Patient Location: PACU  Anesthesia Type:General  Level of Consciousness: awake, alert  and oriented  Airway & Oxygen Therapy: Patient Spontanous Breathing and Patient connected to nasal cannula oxygen  Post-op Assessment: Report given to RN, Post -op Vital signs reviewed and stable and Patient moving all extremities X 4  Post vital signs: Reviewed and stable  Last Vitals:  Vitals Value Taken Time  BP    Temp    Pulse 64 08/25/2018 11:11 AM  Resp 14 08/25/2018 11:11 AM  SpO2 100 % 08/25/2018 11:11 AM  Vitals shown include unvalidated device data.  Last Pain:  Vitals:   08/25/18 0916  TempSrc:   PainSc: 0-No pain      Patients Stated Pain Goal: 2 (37/54/23 7023)  Complications: No apparent anesthesia complications

## 2018-08-25 NOTE — Discharge Instructions (Addendum)
° °  [facility] ° °Discharge Instructions ° °AV Fistula or Graft Surgery for Dialysis Access ° °Please refer to the following instructions for your post-procedure care. Your surgeon or physician assistant will discuss any changes with you. ° °Activity ° °You may drive the day following your surgery, if you are comfortable and no longer taking prescription pain medication. Resume full activity as the soreness in your incision resolves. ° °Bathing/Showering ° °You may shower after you go home. Keep your incision dry for 48 hours. Do not soak in a bathtub, hot tub, or swim until the incision heals completely. You may not shower if you have a hemodialysis catheter. ° °Incision Care ° °Clean your incision with mild soap and water after 48 hours. Pat the area dry with a clean towel. You do not need a bandage unless otherwise instructed. Do not apply any ointments or creams to your incision. You may have skin glue on your incision. Do not peel it off. It will come off on its own in about one week. Your arm may swell a bit after surgery. To reduce swelling use pillows to elevate your arm so it is above your heart. Your doctor will tell you if you need to lightly wrap your arm with an ACE bandage. ° °Diet ° °Resume your normal diet. There are not special food restrictions following this procedure. In order to heal from your surgery, it is CRITICAL to get adequate nutrition. Your body requires vitamins, minerals, and protein. Vegetables are the best source of vitamins and minerals. Vegetables also provide the perfect balance of protein. Processed food has little nutritional value, so try to avoid this. ° °Medications ° °Resume taking all of your medications. If your incision is causing pain, you may take over-the counter pain relievers such as acetaminophen (Tylenol). If you were prescribed a stronger pain medication, please be aware these medications can cause nausea and constipation. Prevent  nausea by taking the medication with a snack or meal. Avoid constipation by drinking plenty of fluids and eating foods with high amount of fiber, such as fruits, vegetables, and grains. Do not take Tylenol if you are taking prescription pain medications. ° ° ° ° °Follow up °Your surgeon may want to see you in the office following your access surgery. If so, this will be arranged at the time of your surgery. ° °Please call us immediately for any of the following conditions: ° °Increased pain, redness, drainage (pus) from your incision site °Fever of 101 degrees or higher °Severe or worsening pain at your incision site °Hand pain or numbness. ° °Reduce your risk of vascular disease: ° °Stop smoking. If you would like help, call [facility] at 1-800-QUIT-NOW (1-800-784-8669) or [facility] at 336-586-4000 ° °Manage your cholesterol °Maintain a desired weight °Control your diabetes °Keep your blood pressure down ° °Dialysis ° °It will take several weeks to several months for your new dialysis access to be ready for use. Your surgeon will determine when it is OK to use it. Your nephrologist will continue to direct your dialysis. You can continue to use your Permcath until your new access is ready for use. ° °If you have any questions, please call the office at [phone]. ° °

## 2018-08-26 ENCOUNTER — Encounter (HOSPITAL_COMMUNITY): Payer: Self-pay | Admitting: Vascular Surgery

## 2018-08-26 DIAGNOSIS — D649 Anemia, unspecified: Secondary | ICD-10-CM | POA: Diagnosis not present

## 2018-08-26 DIAGNOSIS — Z23 Encounter for immunization: Secondary | ICD-10-CM | POA: Diagnosis not present

## 2018-08-26 DIAGNOSIS — N189 Chronic kidney disease, unspecified: Secondary | ICD-10-CM | POA: Diagnosis not present

## 2018-08-26 DIAGNOSIS — D509 Iron deficiency anemia, unspecified: Secondary | ICD-10-CM | POA: Diagnosis not present

## 2018-08-26 DIAGNOSIS — N183 Chronic kidney disease, stage 3 (moderate): Secondary | ICD-10-CM | POA: Diagnosis not present

## 2018-08-26 DIAGNOSIS — N179 Acute kidney failure, unspecified: Secondary | ICD-10-CM | POA: Diagnosis not present

## 2018-08-28 DIAGNOSIS — Z992 Dependence on renal dialysis: Secondary | ICD-10-CM | POA: Diagnosis not present

## 2018-08-28 DIAGNOSIS — N186 End stage renal disease: Secondary | ICD-10-CM | POA: Diagnosis not present

## 2018-08-29 DIAGNOSIS — N183 Chronic kidney disease, stage 3 (moderate): Secondary | ICD-10-CM | POA: Diagnosis not present

## 2018-08-29 DIAGNOSIS — Z23 Encounter for immunization: Secondary | ICD-10-CM | POA: Diagnosis not present

## 2018-08-29 DIAGNOSIS — N189 Chronic kidney disease, unspecified: Secondary | ICD-10-CM | POA: Diagnosis not present

## 2018-08-29 DIAGNOSIS — D649 Anemia, unspecified: Secondary | ICD-10-CM | POA: Diagnosis not present

## 2018-08-29 DIAGNOSIS — N185 Chronic kidney disease, stage 5: Secondary | ICD-10-CM | POA: Diagnosis not present

## 2018-08-29 DIAGNOSIS — N179 Acute kidney failure, unspecified: Secondary | ICD-10-CM | POA: Diagnosis not present

## 2018-08-29 DIAGNOSIS — D509 Iron deficiency anemia, unspecified: Secondary | ICD-10-CM | POA: Diagnosis not present

## 2018-08-31 DIAGNOSIS — N189 Chronic kidney disease, unspecified: Secondary | ICD-10-CM | POA: Diagnosis not present

## 2018-08-31 DIAGNOSIS — D649 Anemia, unspecified: Secondary | ICD-10-CM | POA: Diagnosis not present

## 2018-08-31 DIAGNOSIS — N179 Acute kidney failure, unspecified: Secondary | ICD-10-CM | POA: Diagnosis not present

## 2018-08-31 DIAGNOSIS — D509 Iron deficiency anemia, unspecified: Secondary | ICD-10-CM | POA: Diagnosis not present

## 2018-08-31 DIAGNOSIS — N183 Chronic kidney disease, stage 3 (moderate): Secondary | ICD-10-CM | POA: Diagnosis not present

## 2018-08-31 DIAGNOSIS — Z23 Encounter for immunization: Secondary | ICD-10-CM | POA: Diagnosis not present

## 2018-09-01 DIAGNOSIS — I2510 Atherosclerotic heart disease of native coronary artery without angina pectoris: Secondary | ICD-10-CM | POA: Diagnosis not present

## 2018-09-01 DIAGNOSIS — J449 Chronic obstructive pulmonary disease, unspecified: Secondary | ICD-10-CM | POA: Diagnosis not present

## 2018-09-01 DIAGNOSIS — I509 Heart failure, unspecified: Secondary | ICD-10-CM | POA: Diagnosis not present

## 2018-09-01 DIAGNOSIS — N186 End stage renal disease: Secondary | ICD-10-CM | POA: Diagnosis not present

## 2018-09-02 DIAGNOSIS — D649 Anemia, unspecified: Secondary | ICD-10-CM | POA: Diagnosis not present

## 2018-09-02 DIAGNOSIS — D509 Iron deficiency anemia, unspecified: Secondary | ICD-10-CM | POA: Diagnosis not present

## 2018-09-02 DIAGNOSIS — N179 Acute kidney failure, unspecified: Secondary | ICD-10-CM | POA: Diagnosis not present

## 2018-09-02 DIAGNOSIS — N183 Chronic kidney disease, stage 3 (moderate): Secondary | ICD-10-CM | POA: Diagnosis not present

## 2018-09-02 DIAGNOSIS — N189 Chronic kidney disease, unspecified: Secondary | ICD-10-CM | POA: Diagnosis not present

## 2018-09-02 DIAGNOSIS — Z23 Encounter for immunization: Secondary | ICD-10-CM | POA: Diagnosis not present

## 2018-09-05 DIAGNOSIS — N183 Chronic kidney disease, stage 3 (moderate): Secondary | ICD-10-CM | POA: Diagnosis not present

## 2018-09-05 DIAGNOSIS — Z23 Encounter for immunization: Secondary | ICD-10-CM | POA: Diagnosis not present

## 2018-09-05 DIAGNOSIS — D649 Anemia, unspecified: Secondary | ICD-10-CM | POA: Diagnosis not present

## 2018-09-05 DIAGNOSIS — N189 Chronic kidney disease, unspecified: Secondary | ICD-10-CM | POA: Diagnosis not present

## 2018-09-05 DIAGNOSIS — N179 Acute kidney failure, unspecified: Secondary | ICD-10-CM | POA: Diagnosis not present

## 2018-09-05 DIAGNOSIS — D509 Iron deficiency anemia, unspecified: Secondary | ICD-10-CM | POA: Diagnosis not present

## 2018-09-07 DIAGNOSIS — N179 Acute kidney failure, unspecified: Secondary | ICD-10-CM | POA: Diagnosis not present

## 2018-09-07 DIAGNOSIS — Z23 Encounter for immunization: Secondary | ICD-10-CM | POA: Diagnosis not present

## 2018-09-07 DIAGNOSIS — D509 Iron deficiency anemia, unspecified: Secondary | ICD-10-CM | POA: Diagnosis not present

## 2018-09-07 DIAGNOSIS — D649 Anemia, unspecified: Secondary | ICD-10-CM | POA: Diagnosis not present

## 2018-09-07 DIAGNOSIS — N189 Chronic kidney disease, unspecified: Secondary | ICD-10-CM | POA: Diagnosis not present

## 2018-09-07 DIAGNOSIS — N183 Chronic kidney disease, stage 3 (moderate): Secondary | ICD-10-CM | POA: Diagnosis not present

## 2018-09-09 DIAGNOSIS — Z23 Encounter for immunization: Secondary | ICD-10-CM | POA: Diagnosis not present

## 2018-09-09 DIAGNOSIS — D509 Iron deficiency anemia, unspecified: Secondary | ICD-10-CM | POA: Diagnosis not present

## 2018-09-09 DIAGNOSIS — N179 Acute kidney failure, unspecified: Secondary | ICD-10-CM | POA: Diagnosis not present

## 2018-09-09 DIAGNOSIS — D649 Anemia, unspecified: Secondary | ICD-10-CM | POA: Diagnosis not present

## 2018-09-09 DIAGNOSIS — N183 Chronic kidney disease, stage 3 (moderate): Secondary | ICD-10-CM | POA: Diagnosis not present

## 2018-09-09 DIAGNOSIS — N189 Chronic kidney disease, unspecified: Secondary | ICD-10-CM | POA: Diagnosis not present

## 2018-09-12 DIAGNOSIS — Z23 Encounter for immunization: Secondary | ICD-10-CM | POA: Diagnosis not present

## 2018-09-12 DIAGNOSIS — D509 Iron deficiency anemia, unspecified: Secondary | ICD-10-CM | POA: Diagnosis not present

## 2018-09-12 DIAGNOSIS — N183 Chronic kidney disease, stage 3 (moderate): Secondary | ICD-10-CM | POA: Diagnosis not present

## 2018-09-12 DIAGNOSIS — N189 Chronic kidney disease, unspecified: Secondary | ICD-10-CM | POA: Diagnosis not present

## 2018-09-12 DIAGNOSIS — D649 Anemia, unspecified: Secondary | ICD-10-CM | POA: Diagnosis not present

## 2018-09-12 DIAGNOSIS — N179 Acute kidney failure, unspecified: Secondary | ICD-10-CM | POA: Diagnosis not present

## 2018-09-14 DIAGNOSIS — N183 Chronic kidney disease, stage 3 (moderate): Secondary | ICD-10-CM | POA: Diagnosis not present

## 2018-09-14 DIAGNOSIS — N179 Acute kidney failure, unspecified: Secondary | ICD-10-CM | POA: Diagnosis not present

## 2018-09-14 DIAGNOSIS — N189 Chronic kidney disease, unspecified: Secondary | ICD-10-CM | POA: Diagnosis not present

## 2018-09-14 DIAGNOSIS — D509 Iron deficiency anemia, unspecified: Secondary | ICD-10-CM | POA: Diagnosis not present

## 2018-09-14 DIAGNOSIS — Z23 Encounter for immunization: Secondary | ICD-10-CM | POA: Diagnosis not present

## 2018-09-14 DIAGNOSIS — D649 Anemia, unspecified: Secondary | ICD-10-CM | POA: Diagnosis not present

## 2018-09-16 DIAGNOSIS — N183 Chronic kidney disease, stage 3 (moderate): Secondary | ICD-10-CM | POA: Diagnosis not present

## 2018-09-16 DIAGNOSIS — D649 Anemia, unspecified: Secondary | ICD-10-CM | POA: Diagnosis not present

## 2018-09-16 DIAGNOSIS — D509 Iron deficiency anemia, unspecified: Secondary | ICD-10-CM | POA: Diagnosis not present

## 2018-09-16 DIAGNOSIS — N179 Acute kidney failure, unspecified: Secondary | ICD-10-CM | POA: Diagnosis not present

## 2018-09-16 DIAGNOSIS — N189 Chronic kidney disease, unspecified: Secondary | ICD-10-CM | POA: Diagnosis not present

## 2018-09-16 DIAGNOSIS — Z23 Encounter for immunization: Secondary | ICD-10-CM | POA: Diagnosis not present

## 2018-09-19 DIAGNOSIS — D509 Iron deficiency anemia, unspecified: Secondary | ICD-10-CM | POA: Diagnosis not present

## 2018-09-19 DIAGNOSIS — Z23 Encounter for immunization: Secondary | ICD-10-CM | POA: Diagnosis not present

## 2018-09-19 DIAGNOSIS — N189 Chronic kidney disease, unspecified: Secondary | ICD-10-CM | POA: Diagnosis not present

## 2018-09-19 DIAGNOSIS — N183 Chronic kidney disease, stage 3 (moderate): Secondary | ICD-10-CM | POA: Diagnosis not present

## 2018-09-19 DIAGNOSIS — N179 Acute kidney failure, unspecified: Secondary | ICD-10-CM | POA: Diagnosis not present

## 2018-09-19 DIAGNOSIS — D649 Anemia, unspecified: Secondary | ICD-10-CM | POA: Diagnosis not present

## 2018-09-21 ENCOUNTER — Telehealth: Payer: Self-pay

## 2018-09-21 DIAGNOSIS — N189 Chronic kidney disease, unspecified: Secondary | ICD-10-CM | POA: Diagnosis not present

## 2018-09-21 DIAGNOSIS — N179 Acute kidney failure, unspecified: Secondary | ICD-10-CM | POA: Diagnosis not present

## 2018-09-21 DIAGNOSIS — D649 Anemia, unspecified: Secondary | ICD-10-CM | POA: Diagnosis not present

## 2018-09-21 DIAGNOSIS — N183 Chronic kidney disease, stage 3 (moderate): Secondary | ICD-10-CM | POA: Diagnosis not present

## 2018-09-21 DIAGNOSIS — Z23 Encounter for immunization: Secondary | ICD-10-CM | POA: Diagnosis not present

## 2018-09-21 DIAGNOSIS — D509 Iron deficiency anemia, unspecified: Secondary | ICD-10-CM | POA: Diagnosis not present

## 2018-09-21 NOTE — Progress Notes (Signed)
HEMATOLOGY/ONCOLOGY CONSULTATION NOTE  Date of Service: 09/22/2018  Patient Care Team: Rosita Fire, MD as PCP - General (Internal Medicine) Josue Hector, MD as PCP - Cardiology (Cardiology) Danie Binder, MD (Gastroenterology)  [place] [phone] Legal Guardian [employer]  CHIEF COMPLAINTS/PURPOSE OF CONSULTATION:  Bronchopulmonary Carcinoid  HISTORY OF PRESENTING ILLNESS:  [name] is a63 y.o.F with mental disability in LTC, asthma well-controlled, CKD III Baseline Cr 1.3-1.6, HTN, and DM who presented with severe dyspnea requiring BiPAP by EMS. On arrival in the ER was in V. tach, cardiology consulted and given amiodarone bolus, then developed unstable wide-complex tachycardia and acute respiratory failure. Creatinine 4.3, potassium 7.5 on admission, pH 7.15, PCO2 45, lactic acid 3.2, chest x-ray pneumonia. Temporizing potassium measures given, broad-spectrum antibiotics started, intubated and transferred to ICU at [facility]. Extubated 2/28 after 6 days.  The patient was recently admitted to the hospital earlier in February 2020 for community-acquired pneumonia.  During her hospitalization, the patient had a CT of the chest performed on 05/13/2018 which showed a masslike consolidation measuring 5 x 8.2 cm over the left lower lobe.  It was recommended that she have a follow-up CT scan performed within the next month as an outpatient.  During this current hospitalization, it was recommended that she undergo TEE and bronchoscopy.  This was performed on 05/26/2018.  Biopsy results showed typical carcinoid tumor and atypical squamous metaplasia, favor reactive.    When seen today, the patient had just finished dialysis.  She reports having fatigue and a cough.  She states that she has occasional blood-tinged sputum when she coughs.  She denies fevers and chills.  Denies chest pain and shortness of breath.  Denies abdominal discomfort, nausea, vomiting, constipation,  diarrhea.  She reports intermittent headaches. Oncology was consulted for recommendations regarding her carcinoid tumor.  Of note, the patient reside in an assisted living facility in [place], [place].  She tells me that she has a son who visits her regularly but he is not her power of attorney.  She has a guardian [name]) who oversees her care.  Telephone numbers are on the chart.  Interval History:   [name] returns today for management and evaluation of her Bronchopulmonary carcinoid. The patient's last visit with [place] was on 06/01/18. The pt reports that she is doing well overall.  The pt reports that she is currently residing at [facility] term [facility]. She notes that her breathing has been stable, and notes that she has a cough once in a while. She denies CP or SOB. She notes that she ambulates sometimes on her own strength.  She continues on dialysis. She notes that she has been eating well and endorses a stable weight overall. She denies abdominal pains, skin rashes, or diarrhea.  Most recent lab results (06/09/18) of CBC (prior to blood transfusion) is as follows: all values are WNL except for RBC at 2.77, HGB at 7.8, HCT at 26.9, MCHC at 29.0, RDW at 17.3.  On review of systems, pt reports stable energy levels, stable weight, eating well, breathing well, and denies CP, SOB, skin rashes, abdominal pains, diarrhea, leg swelling, and any other symptoms.    MEDICAL HISTORY:  Past Medical History:  Diagnosis Date  . Arthritis   . Asthma   . Atrial fibrillation ([place])   . Chronic diastolic CHF (congestive heart failure) ([place])   . CKD (chronic kidney disease), stage III ([place])   . Diabetes mellitus  type 2 in obese ([place])   . Hypertension   . Iron deficiency anemia 10/16/2010  . Mental handicap 10/16/2010  . Mild CAD 2013  . Morbid obesity ([place])   . OSA (obstructive sleep apnea)     SURGICAL HISTORY: Past Surgical History:  Procedure Laterality Date  .  ABDOMINAL HYSTERECTOMY    . AV FISTULA PLACEMENT Right 06/06/2018   Procedure: ARTERIOVENOUS (AV) FISTULA CREATION;  Surgeon: Marty Heck, MD;  Location: [facility];  Service: Vascular;  Laterality: Right;  . CESAREAN SECTION    . CHOLECYSTECTOMY    . COLONOSCOPY  08/2010   normal TI, sigmoid polyp (adenoma ). Next TCS due  08/2015,  . ESOPHAGOGASTRODUODENOSCOPY  08/2010   antral and duodenal erosions s/p bx (chronic gastritis, no h.pylori, no celiac dz ), hiatal hernia  . FISTULA SUPERFICIALIZATION Right 08/25/2018   Procedure: FISTULA SUPERFICIALIZATION RIGHT ARM;  Surgeon: Waynetta Sandy, MD;  Location: [facility];  Service: Cardiovascular;  Laterality: Right;  . IR FLUORO GUIDE CV LINE RIGHT  05/31/2018  . IR US GUIDE VASC ACCESS RIGHT  05/31/2018  . KNEE SURGERY     right knee @ 63 years of age  . LEFT AND RIGHT HEART CATHETERIZATION WITH CORONARY ANGIOGRAM N/A 09/21/2011   Procedure: LEFT AND RIGHT HEART CATHETERIZATION WITH CORONARY ANGIOGRAM;  Surgeon: Birdie Riddle, MD;  Location: [facility] CATH LAB;  Service: Cardiovascular;  Laterality: N/A;    SOCIAL HISTORY: Social History   Socioeconomic History  . Marital status: Divorced    Spouse name: Not on file  . Number of children: 1  . Years of education: Not on file  . Highest education level: Not on file  Occupational History    Employer: UNEMPLOYED  Social Needs  . Financial resource strain: Not on file  . Food insecurity    Worry: Not on file    Inability: Not on file  . Transportation needs    Medical: Not on file    Non-medical: Not on file  Tobacco Use  . Smoking status: Former Smoker    Packs/day: 1.00    Years: 0.00    Pack years: 0.00    Types: Cigarettes    Quit date: 04/11/1996    Years since quitting: 22.4  . Smokeless tobacco: Never Used  . Tobacco comment: quit a couple year ago  Substance and Sexual Activity  . Alcohol use: No  . Drug use: No  . Sexual activity: Never  Lifestyle  . Physical activity     Days per week: Not on file    Minutes per session: Not on file  . Stress: Not on file  Relationships  . Social Herbalist on phone: Not on file    Gets together: Not on file    Attends religious service: Not on file    Active member of club or organization: Not on file    Attends meetings of clubs or organizations: Not on file    Relationship status: Not on file  . Intimate partner violence    Fear of current or ex partner: Not on file    Emotionally abused: Not on file    Physically abused: Not on file    Forced sexual activity: Not on file  Other Topics Concern  . Not on file  Social History Narrative   Lives with parents.     FAMILY HISTORY: Family History  Problem Relation Age of Onset  . Diabetes Mother   . Hypertension  Mother   . Heart failure Mother   . Hypertension Father   . Atrial fibrillation Father   . Kidney disease Other        son reportedly has had cyst on kidney and lung s/p surgery at [facility]  . Hypertension Son   . Colon cancer Neg Hx     ALLERGIES:  has No Known Allergies.  MEDICATIONS:  Current Outpatient Medications  Medication Sig Dispense Refill  . ACCU-CHEK AVIVA PLUS test strip USE TO TEST THREE TIMES DAILY. 100 each 5  . ACCU-CHEK SOFTCLIX LANCETS lancets USE TO TEST THREE TIMES DAILY. 100 each 5  . acetaminophen (TYLENOL) 325 MG tablet Take 325 mg by mouth every 12 (twelve) hours as needed for moderate pain.    Marland Kitchen albuterol (PROVENTIL HFA;VENTOLIN HFA) 108 (90 Base) MCG/ACT inhaler Inhale 2 puffs into the lungs every 6 (six) hours as needed for shortness of breath.    Marland Kitchen apixaban (ELIQUIS) 5 MG TABS tablet Take 1 tablet (5 mg total) by mouth 2 (two) times daily. 60 tablet 0  . diltiazem (CARDIZEM CD) 240 MG 24 hr capsule Take 1 capsule (240 mg total) by mouth daily. 30 capsule 0  . fluticasone (FLONASE) 50 MCG/ACT nasal spray Place 2 sprays into both nostrils daily as needed for allergies or rhinitis.    .  Fluticasone-Umeclidin-Vilant (TRELEGY ELLIPTA) 100-62.5-25 MCG/INH AEPB Inhale 1 puff into the lungs daily.     Marland Kitchen guaiFENesin (ROBITUSSIN) 100 MG/5ML SOLN Take 5 mLs (100 mg total) by mouth every 4 (four) hours as needed for cough or to loosen phlegm. 236 mL 0  . hydrALAZINE (APRESOLINE) 25 MG tablet Take 1 tablet (25 mg total) by mouth every 8 (eight) hours for 30 days. (Patient taking differently: Take 25 mg by mouth 3 (three) times daily. ) 90 tablet 0  . insulin regular human CONCENTRATED (HUMULIN R U-500 KWIKPEN) 500 UNIT/ML kwikpen Inject 20 Units into the skin 3 (three) times daily with meals. Inject 20 units with breakfast, 20 units with lunch, and 20 units with supper when blood glucose before food is greater than 90 mg/dL 4 pen 2  . ipratropium-albuterol (DUONEB) 0.5-2.5 (3) MG/3ML SOLN Take 3 mLs by nebulization every 6 (six) hours as needed (shortness of breath).    . isosorbide dinitrate (ISORDIL) 10 MG tablet Take 1 tablet (10 mg total) by mouth 3 (three) times daily. 90 tablet 0  . loratadine (CLARITIN) 10 MG tablet Take 10 mg by mouth daily.    . metoprolol tartrate (LOPRESSOR) 100 MG tablet Take 100 mg by mouth daily.    Marland Kitchen NOVOFINE AUTOCOVER 30G X 8 MM MISC USE AS DIRECTED FOR 3 TIMES DAILY INSULIN ADMINISTRATION. 100 each 3  . oxyCODONE-acetaminophen (PERCOCET/ROXICET) 5-325 MG tablet Take 1 tablet by mouth every 6 (six) hours as needed. 12 tablet 0  . SURE COMFORT PEN NEEDLES 31G X 8 MM MISC USE AS DIRECTED WITH LEVEMIR AND NOVOLOG. UP TO FOUR TIMES DAILY. 100 each 5  . triamcinolone cream (KENALOG) 0.5 % Apply 1 application topically every 12 (twelve) hours as needed (allergic reaction).     No current facility-administered medications for this visit.     REVIEW OF SYSTEMS:    10 Point review of Systems was done is negative except as noted above.  PHYSICAL EXAMINATION: ECOG PERFORMANCE STATUS: 3 - Symptomatic, >50% confined to bed  . Vitals:   09/22/18 1025  BP: 120/61   Pulse: 93  Resp: 18  Temp: 98.2 F (36.8  C)  SpO2: 98%   Filed Weights   09/22/18 1025  Weight: 227 lb 8 oz (103.2 kg)   .Body mass index is 40.3 kg/m.  GENERAL:alert, in no acute distress and comfortable SKIN: no acute rashes, no significant lesions EYES: conjunctiva are pink and non-injected, sclera anicteric OROPHARYNX: MMM, no exudates, no oropharyngeal erythema or ulceration NECK: supple, no JVD LYMPH:  no palpable lymphadenopathy in the cervical, axillary or inguinal regions LUNGS: clear to auscultation b/l with normal respiratory effort HEART: regular rate & rhythm ABDOMEN:  normoactive bowel sounds , non tender, not distended. Extremity: 1+ pedal edema, recent fistula in right upper extremity PSYCH: alert & oriented x 3 with fluent speech NEURO:  no focal motor/sensory deficits  LABORATORY DATA:  I have reviewed the data as listed  . CBC Latest Ref Rng & Units 08/25/2018 06/09/2018 06/08/2018  WBC 4.0 - 10.5 K/uL - 8.5 9.7  Hemoglobin 12.0 - 15.0 g/dL 11.9(L) 7.8(L) 7.4(L)  Hematocrit 36.0 - 46.0 % 35.0(L) 26.9(L) 24.2(L)  Platelets 150 - 400 K/uL - 293 284    . CMP Latest Ref Rng & Units 08/25/2018 06/09/2018 06/09/2018  Glucose 70 - 99 mg/dL 180(H) 197(H) 189(H)  BUN 8 - 23 mg/dL - 61(H) 61(H)  Creatinine 0.44 - 1.00 mg/dL - 4.18(H) 4.30(H)  Sodium 135 - 145 mmol/L 132(L) 129(L) 130(L)  Potassium 3.5 - 5.1 mmol/L 4.7 4.6 5.0  Chloride 98 - 111 mmol/L - 94(L) 94(L)  CO2 22 - 32 mmol/L - 24 26  Calcium 8.9 - 10.3 mg/dL - 8.9 9.0  Total Protein 6.5 - 8.1 g/dL - - -  Total Bilirubin 0.3 - 1.2 mg/dL - - -  Alkaline Phos 38 - 126 U/L - - -  AST 15 - 41 U/L - - -  ALT 0 - 44 U/L - - -    05/26/18 Biopsy:    RADIOGRAPHIC STUDIES: I have personally reviewed the radiological images as listed and agreed with the findings in the report. Vas US Duplex Dialysis Access (avf,avg)  Result Date: 08/23/2018 DIALYSIS ACCESS Reason for Exam: Routine follow up. Access  Site: Right Upper Extremity. Access Type: Brachial-cephalic AVF. Performing Technologist: Ronal Fear RVS, RCS  Examination Guidelines: A complete evaluation includes B-mode imaging, spectral Doppler, color Doppler, and power Doppler as needed of all accessible portions of each vessel. Unilateral testing is considered an integral part of a complete examination. Limited examinations for reoccurring indications may be performed as noted.  Findings: +--------------------+----------+-----------------+--------+ AVF                 PSV (cm/s)Flow Vol (mL/min)Comments +--------------------+----------+-----------------+--------+ Native artery inflow   407          1597                +--------------------+----------+-----------------+--------+ AVF Anastomosis        770                              +--------------------+----------+-----------------+--------+  +------------+----------+-------------+----------+-----------------------------+ OUTFLOW VEINPSV (cm/s)Diameter (cm)Depth (cm)          Describe            +------------+----------+-------------+----------+-----------------------------+ Shoulder       142        0.71        2.00                                 +------------+----------+-------------+----------+-----------------------------+  Prox UA        122        0.71        1.70                                 +------------+----------+-------------+----------+-----------------------------+ Mid UA         113        1.06        1.21                                 +------------+----------+-------------+----------+-----------------------------+ Dist UA        362        0.96        1.14       competing branch and                                                        partially-occlusive      +------------+----------+-------------+----------+-----------------------------+ AC Fossa       734        0.50        0.34                                  +------------+----------+-------------+----------+-----------------------------+   Summary: Web-type thrombus observed in the cephalic outflow vein at the level of the distal upper arm, approximately 2-3 cm from the anastomosis.  *See table(s) above for measurements and observations.  Diagnosing physician: Monica Martinez MD Electronically signed by Monica Martinez MD on 08/23/2018 at 3:27:05 PM.    --------------------------------------------------------------------------------   Final     ASSESSMENT & PLAN:  63 y.o. female with  1.  Typical carcinoid tumor  2.  Anemia: Likely due to her renal disease. No transfusion is indicated.   She is currently on Aranesp.  3.  Kidney disease: Remains oliguric and dialysis dependent.  Management per nephrology.  PLAN: -The pt's legal guardian is [employer], and will fax this note to [phone], ATTN: Butch Penny at [facility] long term care center. -Discussed again the 05/26/18 endobronchial biopsy which revealed typical carcinoid tumor. Ki67 less than 1%. -Would get urinary 24-hour 5 HIAA levels, serum chromogranin, neuron-specific amylase evaluations. -She will also likely need further characterization of her adrenal tumor with a possible biopsy based on goals of care. -Given bronchopulmonary carcinoid can potentially be associated with MEN1 syndrome patient would benefit from outpatient genetic counselor referral at the cancer center. -If she has MEN1 syndrome might need additional biochemical evaluation to rule out other associated conditions. -Will order blood tests today -Will order Gallium 68 dotatate PET/CT for staging and conclusive evaluation, with [employer] approval -Will consider treatment options after obtaining scan and determining if there is metastatic disease.   Labs today Further plan pending [employer] approval   All of the patients questions were answered with apparent satisfaction. The patient knows to call the clinic with any problems,  questions or concerns.  The total time spent in the appt was 25 minutes and more than 50% was on counseling and direct patient cares.    Sullivan Lone MD MS AAHIVMS [facility] Hematology/Oncology Physician [facility]  (  Office):       [phone] (Work cell):  [phone] (Fax):           [phone]  09/22/2018 11:10 AM  I, Baldwin [place], am acting as a scribe for Dr. Sullivan Lone.   .I have reviewed the above documentation for accuracy and completeness, and I agree with the above. Brunetta Genera MD

## 2018-09-21 NOTE — Telephone Encounter (Signed)
Called and left voicemail for [name] regarding pre-screening questions for [name]'s appt on 6/25

## 2018-09-22 ENCOUNTER — Inpatient Hospital Stay: Payer: Medicare Other | Attending: Hematology | Admitting: Hematology

## 2018-09-22 ENCOUNTER — Inpatient Hospital Stay: Payer: Medicare Other

## 2018-09-22 ENCOUNTER — Telehealth: Payer: Self-pay | Admitting: Hematology

## 2018-09-22 ENCOUNTER — Other Ambulatory Visit: Payer: Self-pay

## 2018-09-22 VITALS — BP 120/61 | HR 93 | Temp 98.2°F | Resp 18 | Ht 63.0 in | Wt 227.5 lb

## 2018-09-22 DIAGNOSIS — N189 Chronic kidney disease, unspecified: Secondary | ICD-10-CM | POA: Diagnosis not present

## 2018-09-22 DIAGNOSIS — C7A090 Malignant carcinoid tumor of the bronchus and lung: Secondary | ICD-10-CM | POA: Diagnosis not present

## 2018-09-22 DIAGNOSIS — D649 Anemia, unspecified: Secondary | ICD-10-CM | POA: Diagnosis not present

## 2018-09-22 DIAGNOSIS — Z992 Dependence on renal dialysis: Secondary | ICD-10-CM

## 2018-09-22 DIAGNOSIS — D3A090 Benign carcinoid tumor of the bronchus and lung: Secondary | ICD-10-CM

## 2018-09-22 NOTE — Telephone Encounter (Signed)
Per 6/25 los, patient did not get labs she had already left. I had spoke to someone who is charge of the patient and they stated they were going to contact [facility] and schedule the patient an appt their. I informed MD.  (Further plan pending [employer] approval)

## 2018-09-23 ENCOUNTER — Telehealth: Payer: Self-pay | Admitting: *Deleted

## 2018-09-23 DIAGNOSIS — Z23 Encounter for immunization: Secondary | ICD-10-CM | POA: Diagnosis not present

## 2018-09-23 DIAGNOSIS — D509 Iron deficiency anemia, unspecified: Secondary | ICD-10-CM | POA: Diagnosis not present

## 2018-09-23 DIAGNOSIS — N183 Chronic kidney disease, stage 3 (moderate): Secondary | ICD-10-CM | POA: Diagnosis not present

## 2018-09-23 DIAGNOSIS — N179 Acute kidney failure, unspecified: Secondary | ICD-10-CM | POA: Diagnosis not present

## 2018-09-23 DIAGNOSIS — N189 Chronic kidney disease, unspecified: Secondary | ICD-10-CM | POA: Diagnosis not present

## 2018-09-23 DIAGNOSIS — D649 Anemia, unspecified: Secondary | ICD-10-CM | POA: Diagnosis not present

## 2018-09-23 NOTE — Telephone Encounter (Signed)
Contacted [address] and [place] [phone]) and left messages requesting call back.  Dr. Irene Limbo is seeking information on process for ordering medical tests for patient.

## 2018-09-24 DIAGNOSIS — Z23 Encounter for immunization: Secondary | ICD-10-CM | POA: Diagnosis not present

## 2018-09-24 DIAGNOSIS — D509 Iron deficiency anemia, unspecified: Secondary | ICD-10-CM | POA: Diagnosis not present

## 2018-09-24 DIAGNOSIS — D649 Anemia, unspecified: Secondary | ICD-10-CM | POA: Diagnosis not present

## 2018-09-24 DIAGNOSIS — N189 Chronic kidney disease, unspecified: Secondary | ICD-10-CM | POA: Diagnosis not present

## 2018-09-24 DIAGNOSIS — N183 Chronic kidney disease, stage 3 (moderate): Secondary | ICD-10-CM | POA: Diagnosis not present

## 2018-09-24 DIAGNOSIS — N179 Acute kidney failure, unspecified: Secondary | ICD-10-CM | POA: Diagnosis not present

## 2018-09-26 DIAGNOSIS — N183 Chronic kidney disease, stage 3 (moderate): Secondary | ICD-10-CM | POA: Diagnosis not present

## 2018-09-26 DIAGNOSIS — D509 Iron deficiency anemia, unspecified: Secondary | ICD-10-CM | POA: Diagnosis not present

## 2018-09-26 DIAGNOSIS — N179 Acute kidney failure, unspecified: Secondary | ICD-10-CM | POA: Diagnosis not present

## 2018-09-26 DIAGNOSIS — Z23 Encounter for immunization: Secondary | ICD-10-CM | POA: Diagnosis not present

## 2018-09-26 DIAGNOSIS — D649 Anemia, unspecified: Secondary | ICD-10-CM | POA: Diagnosis not present

## 2018-09-26 DIAGNOSIS — N189 Chronic kidney disease, unspecified: Secondary | ICD-10-CM | POA: Diagnosis not present

## 2018-09-27 DIAGNOSIS — Z992 Dependence on renal dialysis: Secondary | ICD-10-CM | POA: Diagnosis not present

## 2018-09-27 DIAGNOSIS — N186 End stage renal disease: Secondary | ICD-10-CM | POA: Diagnosis not present

## 2018-09-28 DIAGNOSIS — D649 Anemia, unspecified: Secondary | ICD-10-CM | POA: Diagnosis not present

## 2018-09-28 DIAGNOSIS — D509 Iron deficiency anemia, unspecified: Secondary | ICD-10-CM | POA: Diagnosis not present

## 2018-09-28 DIAGNOSIS — N179 Acute kidney failure, unspecified: Secondary | ICD-10-CM | POA: Diagnosis not present

## 2018-09-28 DIAGNOSIS — N2581 Secondary hyperparathyroidism of renal origin: Secondary | ICD-10-CM | POA: Diagnosis not present

## 2018-09-28 DIAGNOSIS — Z23 Encounter for immunization: Secondary | ICD-10-CM | POA: Diagnosis not present

## 2018-09-28 DIAGNOSIS — N189 Chronic kidney disease, unspecified: Secondary | ICD-10-CM | POA: Diagnosis not present

## 2018-09-30 DIAGNOSIS — D509 Iron deficiency anemia, unspecified: Secondary | ICD-10-CM | POA: Diagnosis not present

## 2018-09-30 DIAGNOSIS — N189 Chronic kidney disease, unspecified: Secondary | ICD-10-CM | POA: Diagnosis not present

## 2018-09-30 DIAGNOSIS — D649 Anemia, unspecified: Secondary | ICD-10-CM | POA: Diagnosis not present

## 2018-09-30 DIAGNOSIS — Z23 Encounter for immunization: Secondary | ICD-10-CM | POA: Diagnosis not present

## 2018-09-30 DIAGNOSIS — N179 Acute kidney failure, unspecified: Secondary | ICD-10-CM | POA: Diagnosis not present

## 2018-09-30 DIAGNOSIS — N2581 Secondary hyperparathyroidism of renal origin: Secondary | ICD-10-CM | POA: Diagnosis not present

## 2018-09-30 DIAGNOSIS — I5032 Chronic diastolic (congestive) heart failure: Secondary | ICD-10-CM | POA: Diagnosis not present

## 2018-10-03 DIAGNOSIS — N2581 Secondary hyperparathyroidism of renal origin: Secondary | ICD-10-CM | POA: Diagnosis not present

## 2018-10-03 DIAGNOSIS — D509 Iron deficiency anemia, unspecified: Secondary | ICD-10-CM | POA: Diagnosis not present

## 2018-10-03 DIAGNOSIS — N179 Acute kidney failure, unspecified: Secondary | ICD-10-CM | POA: Diagnosis not present

## 2018-10-03 DIAGNOSIS — D649 Anemia, unspecified: Secondary | ICD-10-CM | POA: Diagnosis not present

## 2018-10-03 DIAGNOSIS — Z23 Encounter for immunization: Secondary | ICD-10-CM | POA: Diagnosis not present

## 2018-10-03 DIAGNOSIS — N189 Chronic kidney disease, unspecified: Secondary | ICD-10-CM | POA: Diagnosis not present

## 2018-10-05 DIAGNOSIS — D509 Iron deficiency anemia, unspecified: Secondary | ICD-10-CM | POA: Diagnosis not present

## 2018-10-05 DIAGNOSIS — D649 Anemia, unspecified: Secondary | ICD-10-CM | POA: Diagnosis not present

## 2018-10-05 DIAGNOSIS — N179 Acute kidney failure, unspecified: Secondary | ICD-10-CM | POA: Diagnosis not present

## 2018-10-05 DIAGNOSIS — N2581 Secondary hyperparathyroidism of renal origin: Secondary | ICD-10-CM | POA: Diagnosis not present

## 2018-10-05 DIAGNOSIS — Z23 Encounter for immunization: Secondary | ICD-10-CM | POA: Diagnosis not present

## 2018-10-05 DIAGNOSIS — N189 Chronic kidney disease, unspecified: Secondary | ICD-10-CM | POA: Diagnosis not present

## 2018-10-07 DIAGNOSIS — N2581 Secondary hyperparathyroidism of renal origin: Secondary | ICD-10-CM | POA: Diagnosis not present

## 2018-10-07 DIAGNOSIS — D509 Iron deficiency anemia, unspecified: Secondary | ICD-10-CM | POA: Diagnosis not present

## 2018-10-07 DIAGNOSIS — N189 Chronic kidney disease, unspecified: Secondary | ICD-10-CM | POA: Diagnosis not present

## 2018-10-07 DIAGNOSIS — D649 Anemia, unspecified: Secondary | ICD-10-CM | POA: Diagnosis not present

## 2018-10-07 DIAGNOSIS — Z23 Encounter for immunization: Secondary | ICD-10-CM | POA: Diagnosis not present

## 2018-10-07 DIAGNOSIS — N179 Acute kidney failure, unspecified: Secondary | ICD-10-CM | POA: Diagnosis not present

## 2018-10-10 DIAGNOSIS — N2581 Secondary hyperparathyroidism of renal origin: Secondary | ICD-10-CM | POA: Diagnosis not present

## 2018-10-10 DIAGNOSIS — N189 Chronic kidney disease, unspecified: Secondary | ICD-10-CM | POA: Diagnosis not present

## 2018-10-10 DIAGNOSIS — D649 Anemia, unspecified: Secondary | ICD-10-CM | POA: Diagnosis not present

## 2018-10-10 DIAGNOSIS — Z23 Encounter for immunization: Secondary | ICD-10-CM | POA: Diagnosis not present

## 2018-10-10 DIAGNOSIS — N179 Acute kidney failure, unspecified: Secondary | ICD-10-CM | POA: Diagnosis not present

## 2018-10-10 DIAGNOSIS — D509 Iron deficiency anemia, unspecified: Secondary | ICD-10-CM | POA: Diagnosis not present

## 2018-10-12 DIAGNOSIS — N2581 Secondary hyperparathyroidism of renal origin: Secondary | ICD-10-CM | POA: Diagnosis not present

## 2018-10-12 DIAGNOSIS — N179 Acute kidney failure, unspecified: Secondary | ICD-10-CM | POA: Diagnosis not present

## 2018-10-12 DIAGNOSIS — D649 Anemia, unspecified: Secondary | ICD-10-CM | POA: Diagnosis not present

## 2018-10-12 DIAGNOSIS — Z23 Encounter for immunization: Secondary | ICD-10-CM | POA: Diagnosis not present

## 2018-10-12 DIAGNOSIS — N189 Chronic kidney disease, unspecified: Secondary | ICD-10-CM | POA: Diagnosis not present

## 2018-10-12 DIAGNOSIS — D509 Iron deficiency anemia, unspecified: Secondary | ICD-10-CM | POA: Diagnosis not present

## 2018-10-13 ENCOUNTER — Telehealth: Payer: Self-pay | Admitting: *Deleted

## 2018-10-13 ENCOUNTER — Other Ambulatory Visit: Payer: Self-pay | Admitting: Hematology

## 2018-10-13 DIAGNOSIS — M79674 Pain in right toe(s): Secondary | ICD-10-CM | POA: Diagnosis not present

## 2018-10-13 DIAGNOSIS — M79675 Pain in left toe(s): Secondary | ICD-10-CM | POA: Diagnosis not present

## 2018-10-13 DIAGNOSIS — D3A090 Benign carcinoid tumor of the bronchus and lung: Secondary | ICD-10-CM

## 2018-10-13 DIAGNOSIS — B351 Tinea unguium: Secondary | ICD-10-CM | POA: Diagnosis not present

## 2018-10-13 NOTE — Telephone Encounter (Signed)
[place], [place] guardian regarding f/u testing.  Informed her that Dr. Irene Limbo would like to order an additional test (PET) for patient and wanted to know how to proceed. She states they have limited guardianship of patient, primarily pertaining to medical needs.She asked for description of test and need (refer to OV note) and stated she would discuss with patient and contact office. She also requested most recent OV note to be faxed to [phone]. (Faxed/confirmation received) Ms. Harrington Challenger contacted office and stated she had discussed PET with patient and that patient is agreeable. Ms. Harrington Challenger states that scheduling will need to be completed through [facility], where Ms. Antonetti is resident, as patient does not have phone and that patient has dialysis M-W-F.

## 2018-10-14 DIAGNOSIS — D509 Iron deficiency anemia, unspecified: Secondary | ICD-10-CM | POA: Diagnosis not present

## 2018-10-14 DIAGNOSIS — N2581 Secondary hyperparathyroidism of renal origin: Secondary | ICD-10-CM | POA: Diagnosis not present

## 2018-10-14 DIAGNOSIS — N179 Acute kidney failure, unspecified: Secondary | ICD-10-CM | POA: Diagnosis not present

## 2018-10-14 DIAGNOSIS — D649 Anemia, unspecified: Secondary | ICD-10-CM | POA: Diagnosis not present

## 2018-10-14 DIAGNOSIS — N189 Chronic kidney disease, unspecified: Secondary | ICD-10-CM | POA: Diagnosis not present

## 2018-10-14 DIAGNOSIS — Z23 Encounter for immunization: Secondary | ICD-10-CM | POA: Diagnosis not present

## 2018-10-17 DIAGNOSIS — Z23 Encounter for immunization: Secondary | ICD-10-CM | POA: Diagnosis not present

## 2018-10-17 DIAGNOSIS — N2581 Secondary hyperparathyroidism of renal origin: Secondary | ICD-10-CM | POA: Diagnosis not present

## 2018-10-17 DIAGNOSIS — N189 Chronic kidney disease, unspecified: Secondary | ICD-10-CM | POA: Diagnosis not present

## 2018-10-17 DIAGNOSIS — D509 Iron deficiency anemia, unspecified: Secondary | ICD-10-CM | POA: Diagnosis not present

## 2018-10-17 DIAGNOSIS — N179 Acute kidney failure, unspecified: Secondary | ICD-10-CM | POA: Diagnosis not present

## 2018-10-17 DIAGNOSIS — D649 Anemia, unspecified: Secondary | ICD-10-CM | POA: Diagnosis not present

## 2018-10-27 ENCOUNTER — Telehealth: Payer: Self-pay | Admitting: Cardiovascular Disease

## 2018-10-27 NOTE — Telephone Encounter (Signed)
Spoke with scheduling nurse at facility - she gave consent      Virtual Visit Pre-Appointment Phone Call  "(Name), I am calling you today to discuss your upcoming appointment. We are currently trying to limit exposure to the virus that causes COVID-19 by seeing patients at home rather than in the office."  1. "What is the BEST phone number to call the day of the visit?" - include this in appointment notes  2. Do you have or have access to (through a family member/friend) a smartphone with video capability that we can use for your visit?" a. If yes - list this number in appt notes as cell (if different from BEST phone #) and list the appointment type as a VIDEO visit in appointment notes b. If no - list the appointment type as a PHONE visit in appointment notes  3. Confirm consent - "In the setting of the current Covid19 crisis, you are scheduled for a (phone or video) visit with your provider on (date) at (time).  Just as we do with many in-office visits, in order for you to participate in this visit, we must obtain consent.  If you'd like, I can send this to your mychart (if signed up) or email for you to review.  Otherwise, I can obtain your verbal consent now.  All virtual visits are billed to your insurance company just like a normal visit would be.  By agreeing to a virtual visit, we'd like you to understand that the technology does not allow for your provider to perform an examination, and thus may limit your provider's ability to fully assess your condition. If your provider identifies any concerns that need to be evaluated in person, we will make arrangements to do so.  Finally, though the technology is pretty good, we cannot assure that it will always work on either your or our end, and in the setting of a video visit, we may have to convert it to a phone-only visit.  In either situation, we cannot ensure that we have a secure connection.  Are you willing to proceed?" STAFF: Did the  patient verbally acknowledge consent to telehealth visit? Document YES/NO here: yes  4. Advise patient to be prepared - "Two hours prior to your appointment, go ahead and check your blood pressure, pulse, oxygen saturation, and your weight (if you have the equipment to check those) and write them all down. When your visit starts, your provider will ask you for this information. If you have an Apple Watch or Kardia device, please plan to have heart rate information ready on the day of your appointment. Please have a pen and paper handy nearby the day of the visit as well."  5. Give patient instructions for MyChart download to smartphone OR Doximity/Doxy.me as below if video visit (depending on what platform provider is using)  6. Inform patient they will receive a phone call 15 minutes prior to their appointment time (may be from unknown caller ID) so they should be prepared to answer    TELEPHONE CALL NOTE  [name] has been deemed a candidate for a follow-up tele-health visit to limit community exposure during the Covid-19 pandemic. I spoke with the patient via phone to ensure availability of phone/video source, confirm preferred email & phone number, and discuss instructions and expectations.  I reminded [name] to be prepared with any vital sign and/or heart rhythm information that could potentially be obtained via home monitoring, at the time of her  visit. I reminded [name] to expect a phone call prior to her visit.  Howie Ill 10/27/2018 8:34 AM   INSTRUCTIONS FOR DOWNLOADING THE MYCHART APP TO SMARTPHONE  - The patient must first make sure to have activated MyChart and know their login information - If Apple, go to [employer] and type in MyChart in the search bar and download the app. If Android, ask patient to go to [employer] and type in [facility] in the search bar and download the app. The app is free but as with any other app downloads, their phone  may require them to verify saved payment information or Apple/Android password.  - The patient will need to then log into the app with their MyChart username and password, and select [facility] as their healthcare provider to link the account. When it is time for your visit, go to the MyChart app, find appointments, and click Begin Video Visit. Be sure to Select Allow for your device to access the Microphone and Camera for your visit. You will then be connected, and your provider will be with you shortly.  **If they have any issues connecting, or need assistance please contact MyChart service desk [phone])**  **If using a computer, in order to ensure the best quality for their visit they will need to use either of the following Internet Browsers: [employer], or Google Chrome**  IF USING DOXIMITY or DOXY.ME - The patient will receive a link just prior to their visit by text.     FULL LENGTH CONSENT FOR TELE-HEALTH VISIT   I hereby voluntarily request, consent and authorize [facility] and its employed or contracted physicians, physician assistants, nurse practitioners or other licensed health care professionals (the Practitioner), to provide me with telemedicine health care services (the Services") as deemed necessary by the treating Practitioner. I acknowledge and consent to receive the Services by the Practitioner via telemedicine. I understand that the telemedicine visit will involve communicating with the Practitioner through live audiovisual communication technology and the disclosure of certain medical information by electronic transmission. I acknowledge that I have been given the opportunity to request an in-person assessment or other available alternative prior to the telemedicine visit and am voluntarily participating in the telemedicine visit.  I understand that I have the right to withhold or withdraw my consent to the use of telemedicine in the course of my  care at any time, without affecting my right to future care or treatment, and that the Practitioner or I may terminate the telemedicine visit at any time. I understand that I have the right to inspect all information obtained and/or recorded in the course of the telemedicine visit and may receive copies of available information for a reasonable fee.  I understand that some of the potential risks of receiving the Services via telemedicine include:   Delay or interruption in medical evaluation due to technological equipment failure or disruption;  Information transmitted may not be sufficient (e.g. poor resolution of images) to allow for appropriate medical decision making by the Practitioner; and/or   In rare instances, security protocols could fail, causing a breach of personal health information.  Furthermore, I acknowledge that it is my responsibility to provide information about my medical history, conditions and care that is complete and accurate to the best of my ability. I acknowledge that Practitioner's advice, recommendations, and/or decision may be based on factors not within their control, such as incomplete or inaccurate data provided by me or  distortions of diagnostic images or specimens that may result from electronic transmissions. I understand that the practice of medicine is not an exact science and that Practitioner makes no warranties or guarantees regarding treatment outcomes. I acknowledge that I will receive a copy of this consent concurrently upon execution via email to the email address I last provided but may also request a printed copy by calling the office of [facility].    I understand that my insurance will be billed for this visit.   I have read or had this consent read to me.  I understand the contents of this consent, which adequately explains the benefits and risks of the Services being provided via telemedicine.   I have been provided ample opportunity to ask  questions regarding this consent and the Services and have had my questions answered to my satisfaction.  I give my informed consent for the services to be provided through the use of telemedicine in my medical care  By participating in this telemedicine visit I agree to the above.

## 2018-10-28 ENCOUNTER — Telehealth (INDEPENDENT_AMBULATORY_CARE_PROVIDER_SITE_OTHER): Payer: Medicare Other | Admitting: Cardiovascular Disease

## 2018-10-28 ENCOUNTER — Encounter: Payer: Self-pay | Admitting: Cardiovascular Disease

## 2018-10-28 VITALS — BP 118/68 | HR 57 | Temp 97.1°F | Ht 64.0 in | Wt 239.0 lb

## 2018-10-28 DIAGNOSIS — I472 Ventricular tachycardia, unspecified: Secondary | ICD-10-CM

## 2018-10-28 DIAGNOSIS — N186 End stage renal disease: Secondary | ICD-10-CM

## 2018-10-28 DIAGNOSIS — I480 Paroxysmal atrial fibrillation: Secondary | ICD-10-CM | POA: Diagnosis not present

## 2018-10-28 DIAGNOSIS — Z992 Dependence on renal dialysis: Secondary | ICD-10-CM

## 2018-10-28 NOTE — Progress Notes (Signed)
Virtual Visit via Telephone Note   This visit type was conducted due to national recommendations for restrictions regarding the COVID-19 Pandemic (e.g. social distancing) in an effort to limit this patient's exposure and mitigate transmission in our community.  Due to her co-morbid illnesses, this patient is at least at moderate risk for complications without adequate follow up.  This format is felt to be most appropriate for this patient at this time.  The patient did not have access to video technology/had technical difficulties with video requiring transitioning to audio format only (telephone).  All issues noted in this document were discussed and addressed.  No physical exam could be performed with this format.  Please refer to the patient's chart for her  consent to telehealth for [facility].   Date:  10/28/2018   ID:  [name], DOB 30-May-1955, MRN [id number]  Patient Location: Home Provider Location: Home  PCP:  Rosita Fire, MD  Cardiologist:  Jenkins Rouge, MD  Electrophysiologist:  None   Evaluation Performed:  Follow-Up Visit  Chief Complaint: Atrial fibrillation and ventricular tachycardia  History of Present Illness:    UBAH RADKE is a 63 y.o. female with a complex past medical history.  I have only evaluated her once before.  She has atrial fibrillation diagnosed earlier this year.  She had ventricular tachycardia while hospitalized earlier this year in the setting of multiple medical issues including severe hyperkalemia.  She also has end-stage renal disease and undergoes hemodialysis.  She seldom has palpitations. She denies chest pain. She occasionally has shortness of breath. She has occasional dizziness but denies syncope.  She resides at [facility] (long term care and assisted living).  I spoke with both the patient and her nurse.  The patient does not have symptoms concerning for COVID-19 infection (fever, chills, cough).    Past Medical  History:  Diagnosis Date  . Arthritis   . Asthma   . Atrial fibrillation ([place])   . Chronic diastolic CHF (congestive heart failure) ([place])   . CKD (chronic kidney disease), stage III ([place])   . Diabetes mellitus type 2 in obese ([place])   . Hypertension   . Iron deficiency anemia 10/16/2010  . Mental handicap 10/16/2010  . Mild CAD 2013  . Morbid obesity ([place])   . OSA (obstructive sleep apnea)    Past Surgical History:  Procedure Laterality Date  . ABDOMINAL HYSTERECTOMY    . AV FISTULA PLACEMENT Right 06/06/2018   Procedure: ARTERIOVENOUS (AV) FISTULA CREATION;  Surgeon: Marty Heck, MD;  Location: [facility];  Service: Vascular;  Laterality: Right;  . CESAREAN SECTION    . CHOLECYSTECTOMY    . COLONOSCOPY  08/2010   normal TI, sigmoid polyp (adenoma ). Next TCS due  08/2015,  . ESOPHAGOGASTRODUODENOSCOPY  08/2010   antral and duodenal erosions s/p bx (chronic gastritis, no h.pylori, no celiac dz ), hiatal hernia  . FISTULA SUPERFICIALIZATION Right 08/25/2018   Procedure: FISTULA SUPERFICIALIZATION RIGHT ARM;  Surgeon: Waynetta Sandy, MD;  Location: [facility];  Service: Cardiovascular;  Laterality: Right;  . IR FLUORO GUIDE CV LINE RIGHT  05/31/2018  . IR US GUIDE VASC ACCESS RIGHT  05/31/2018  . KNEE SURGERY     right knee @ 63 years of age  . LEFT AND RIGHT HEART CATHETERIZATION WITH CORONARY ANGIOGRAM N/A 09/21/2011   Procedure: LEFT AND RIGHT HEART CATHETERIZATION WITH CORONARY ANGIOGRAM;  Surgeon: Birdie Riddle, MD;  Location: [facility] CATH [facility];  Service: Cardiovascular;  Laterality: N/A;     Current Meds  Medication Sig  . ACCU-CHEK AVIVA PLUS test strip USE TO TEST THREE TIMES DAILY.  Marland Kitchen ACCU-CHEK SOFTCLIX LANCETS lancets USE TO TEST THREE TIMES DAILY.  Marland Kitchen acetaminophen (TYLENOL) 325 MG tablet Take 325 mg by mouth every 12 (twelve) hours as needed for moderate pain.  Marland Kitchen albuterol (PROVENTIL HFA;VENTOLIN HFA) 108 (90 Base) MCG/ACT inhaler Inhale 2 puffs into the lungs every 6 (six)  hours as needed for shortness of breath.  Marland Kitchen apixaban (ELIQUIS) 5 MG TABS tablet Take 1 tablet (5 mg total) by mouth 2 (two) times daily.  Marland Kitchen diltiazem (CARDIZEM CD) 240 MG 24 hr capsule Take 1 capsule (240 mg total) by mouth daily.  . fluticasone (FLONASE) 50 MCG/ACT nasal spray Place 2 sprays into both nostrils daily as needed for allergies or rhinitis.  . Fluticasone-Umeclidin-Vilant (TRELEGY ELLIPTA) 100-62.5-25 MCG/INH AEPB Inhale 1 puff into the lungs daily.   Marland Kitchen guaiFENesin (ROBITUSSIN) 100 MG/5ML SOLN Take 5 mLs (100 mg total) by mouth every 4 (four) hours as needed for cough or to loosen phlegm.  . hydrALAZINE (APRESOLINE) 25 MG tablet Take 1 tablet (25 mg total) by mouth every 8 (eight) hours for 30 days. (Patient taking differently: Take 25 mg by mouth 3 (three) times daily. )  . insulin regular human CONCENTRATED (HUMULIN R U-500 KWIKPEN) 500 UNIT/ML kwikpen Inject 20 Units into the skin 3 (three) times daily with meals. Inject 20 units with breakfast, 20 units with lunch, and 20 units with supper when blood glucose before food is greater than 90 mg/dL  . ipratropium-albuterol (DUONEB) 0.5-2.5 (3) MG/3ML SOLN Take 3 mLs by nebulization every 6 (six) hours as needed (shortness of breath).  . isosorbide dinitrate (ISORDIL) 10 MG tablet Take 1 tablet (10 mg total) by mouth 3 (three) times daily.  Marland Kitchen loratadine (CLARITIN) 10 MG tablet Take 10 mg by mouth daily.  . metoprolol tartrate (LOPRESSOR) 100 MG tablet Take 100 mg by mouth daily.  Marland Kitchen NOVOFINE AUTOCOVER 30G X 8 MM MISC USE AS DIRECTED FOR 3 TIMES DAILY INSULIN ADMINISTRATION.  Marland Kitchen oxyCODONE-acetaminophen (PERCOCET/ROXICET) 5-325 MG tablet Take 1 tablet by mouth every 6 (six) hours as needed.  . SURE COMFORT PEN NEEDLES 31G X 8 MM MISC USE AS DIRECTED WITH LEVEMIR AND NOVOLOG. UP TO FOUR TIMES DAILY.  Marland Kitchen triamcinolone cream (KENALOG) 0.5 % Apply 1 application topically every 12 (twelve) hours as needed (allergic reaction).     Allergies:    Patient has no known allergies.   Social History   Tobacco Use  . Smoking status: Former Smoker    Packs/day: 1.00    Years: 0.00    Pack years: 0.00    Types: Cigarettes    Quit date: 04/11/1996    Years since quitting: 22.5  . Smokeless tobacco: Never Used  . Tobacco comment: quit a couple year ago  Substance Use Topics  . Alcohol use: No  . Drug use: No     Family Hx: The patient's family history includes Atrial fibrillation in her father; Diabetes in her mother; Heart failure in her mother; Hypertension in her father, mother, and son; Kidney disease in an other family member. There is no history of Colon cancer.  ROS:   Please see the history of present illness.     All other systems reviewed and are negative.   Prior CV studies:   The following studies were reviewed today:  2D echocardiogram 05/22/2018 IMPRESSIONS   1. The left  ventricle has hyperdynamic systolic function, with an ejection fraction of >65%. The cavity size was normal. Left ventricular diastolic Doppler parameters are consistent with impaired relaxation. 2. The right ventricle has normal systolic function. The cavity was normal. There is no increase in right ventricular wall thickness. 3. The mitral valve is normal in structure. 4. The tricuspid valve is normal in structure. 5. The aortic valve is tricuspid Mild thickening of the aortic valve Mild calcification of the aortic valve. 6. There is a small mobile density (1.1 x 0.5cm) on the ventricular surface of the aortic valve. Challenging to visualize. Consider TEE if clinically warranted. 7. The pulmonic valve was normal in structure.  Labs/Other Tests and Data Reviewed:    EKG:  No ECG reviewed.  Recent Labs: 05/21/2018: B Natriuretic Peptide 125.0 05/23/2018: Magnesium 2.4 06/01/2018: TSH 3.292 06/02/2018: ALT 9 06/09/2018: BUN 61; Creatinine, Ser 4.18; Platelets 293 08/25/2018: Hemoglobin 11.9; Potassium 4.7; Sodium 132   Recent Lipid  Panel Lab Results  Component Value Date/Time   CHOL 141 06/02/2018 12:29 AM   TRIG 295 (H) 06/02/2018 12:29 AM   HDL 32 (L) 06/02/2018 12:29 AM   CHOLHDL 4.4 06/02/2018 12:29 AM   LDLCALC 50 06/02/2018 12:29 AM   LDLCALC 80 04/07/2017 09:28 AM    Wt Readings from Last 3 Encounters:  10/28/18 239 lb (108.4 kg)  09/22/18 227 lb 8 oz (103.2 kg)  06/30/18 250 lb (113.4 kg)     Objective:    Vital Signs:  BP 118/68   Pulse (!) 57   Temp (!) 97.1 F (36.2 C) (Oral)   Ht 5\' 4"  (1.626 m)   Wt 239 lb (108.4 kg)   BMI 41.02 kg/m    VITAL SIGNS:  reviewed  ASSESSMENT & PLAN:    1.  Atrial fibrillation: Diagnosed during hospitalization earlier this year.  Multiple medical issues were present at that time.  As per notes, she was in sinus rhythm at the time of hospital discharge on 06/10/2018.  She very seldom has palpitations.  She is on metoprolol and diltiazem.  She takes Eliquis for stroke prophylaxis.  2.  Ventricular tachycardia: Occurred while hospitalized earlier this year in the setting of multiple medical issues including hyperkalemia.  It was treated with IV amiodarone with no recurrences during that hospitalization.  Echocardiogram demonstrated normal left ventricular systolic function.  3.  End-stage renal disease: On hemodialysis.  COVID-19 Education: The signs and symptoms of COVID-19 were discussed with the patient and how to seek care for testing (follow up with PCP or arrange E-visit).  The importance of social distancing was discussed today.  Time:   Today, I have spent 10 minutes with the patient with telehealth technology discussing the above problems.     Medication Adjustments/Labs and Tests Ordered: Current medicines are reviewed at length with the patient today.  Concerns regarding medicines are outlined above.   Tests Ordered: No orders of the defined types were placed in this encounter.   Medication Changes: No orders of the defined types were placed  in this encounter.   Follow Up:  Virtual Visit in 1 year(s)  Signed, Kate Sable, MD  10/28/2018 10:43 AM    [place]

## 2018-10-28 NOTE — Patient Instructions (Signed)
Medication Instructions: Your physician recommends that you continue on your current medications as directed. Please refer to the Current Medication list given to you today.   Labwork: None  Procedures/Testing: None  Follow-Up: 1 year "Virtual Visit" with Dr.Koneswaran  Any Additional Special Instructions Will Be Listed Below (If Applicable).     If you need a refill on your cardiac medications before your next appointment, please call your pharmacy.      Thank you for choosing [facility] !

## 2018-11-07 DIAGNOSIS — Z452 Encounter for adjustment and management of vascular access device: Secondary | ICD-10-CM | POA: Diagnosis not present

## 2018-11-10 DIAGNOSIS — Z79899 Other long term (current) drug therapy: Secondary | ICD-10-CM | POA: Diagnosis not present

## 2018-11-10 DIAGNOSIS — N185 Chronic kidney disease, stage 5: Secondary | ICD-10-CM | POA: Diagnosis not present

## 2018-12-01 DIAGNOSIS — G4733 Obstructive sleep apnea (adult) (pediatric): Secondary | ICD-10-CM | POA: Diagnosis not present

## 2018-12-01 DIAGNOSIS — I10 Essential (primary) hypertension: Secondary | ICD-10-CM | POA: Diagnosis not present

## 2018-12-01 DIAGNOSIS — J449 Chronic obstructive pulmonary disease, unspecified: Secondary | ICD-10-CM | POA: Diagnosis not present

## 2018-12-01 DIAGNOSIS — I2510 Atherosclerotic heart disease of native coronary artery without angina pectoris: Secondary | ICD-10-CM | POA: Diagnosis not present

## 2018-12-08 ENCOUNTER — Other Ambulatory Visit: Payer: Self-pay | Admitting: "Endocrinology

## 2018-12-22 ENCOUNTER — Ambulatory Visit (HOSPITAL_COMMUNITY)
Admission: RE | Admit: 2018-12-22 | Discharge: 2018-12-22 | Disposition: A | Discharge status: Home / Self Care | Payer: Medicare Other | Source: Ambulatory Visit | Attending: Hematology | Admitting: Hematology

## 2018-12-22 ENCOUNTER — Other Ambulatory Visit: Payer: Self-pay

## 2018-12-22 DIAGNOSIS — D3A090 Benign carcinoid tumor of the bronchus and lung: Secondary | ICD-10-CM | POA: Diagnosis not present

## 2018-12-22 DIAGNOSIS — C7A090 Malignant carcinoid tumor of the bronchus and lung: Secondary | ICD-10-CM | POA: Diagnosis not present

## 2018-12-22 MED ORDER — GALLIUM GA 68 DOTATATE IV KIT
5.3000 | PACK | Freq: Once | INTRAVENOUS | Status: AC | PRN
  Administered 2018-12-22: 5.3 via INTRAVENOUS

## 2018-12-23 DIAGNOSIS — I10 Essential (primary) hypertension: Secondary | ICD-10-CM | POA: Diagnosis not present

## 2018-12-23 DIAGNOSIS — Z79899 Other long term (current) drug therapy: Secondary | ICD-10-CM | POA: Diagnosis not present

## 2018-12-23 DIAGNOSIS — N179 Acute kidney failure, unspecified: Secondary | ICD-10-CM | POA: Diagnosis not present

## 2018-12-27 DIAGNOSIS — M79674 Pain in right toe(s): Secondary | ICD-10-CM | POA: Diagnosis not present

## 2018-12-27 DIAGNOSIS — B351 Tinea unguium: Secondary | ICD-10-CM | POA: Diagnosis not present

## 2018-12-27 DIAGNOSIS — M79675 Pain in left toe(s): Secondary | ICD-10-CM | POA: Diagnosis not present

## 2018-12-30 DIAGNOSIS — Z23 Encounter for immunization: Secondary | ICD-10-CM | POA: Diagnosis not present

## 2018-12-30 DIAGNOSIS — Z1389 Encounter for screening for other disorder: Secondary | ICD-10-CM | POA: Diagnosis not present

## 2018-12-30 DIAGNOSIS — J449 Chronic obstructive pulmonary disease, unspecified: Secondary | ICD-10-CM | POA: Diagnosis not present

## 2018-12-30 DIAGNOSIS — I5032 Chronic diastolic (congestive) heart failure: Secondary | ICD-10-CM | POA: Diagnosis not present

## 2018-12-30 DIAGNOSIS — Z1331 Encounter for screening for depression: Secondary | ICD-10-CM | POA: Diagnosis not present

## 2018-12-30 DIAGNOSIS — E1122 Type 2 diabetes mellitus with diabetic chronic kidney disease: Secondary | ICD-10-CM | POA: Diagnosis not present

## 2019-01-03 DIAGNOSIS — I110 Hypertensive heart disease with heart failure: Secondary | ICD-10-CM | POA: Diagnosis not present

## 2019-01-03 DIAGNOSIS — N183 Chronic kidney disease, stage 3 unspecified: Secondary | ICD-10-CM | POA: Diagnosis not present

## 2019-01-03 DIAGNOSIS — Z0001 Encounter for general adult medical examination with abnormal findings: Secondary | ICD-10-CM | POA: Diagnosis not present

## 2019-01-03 DIAGNOSIS — E1165 Type 2 diabetes mellitus with hyperglycemia: Secondary | ICD-10-CM | POA: Diagnosis not present

## 2019-01-04 DIAGNOSIS — I4821 Permanent atrial fibrillation: Secondary | ICD-10-CM | POA: Diagnosis not present

## 2019-01-04 DIAGNOSIS — J449 Chronic obstructive pulmonary disease, unspecified: Secondary | ICD-10-CM | POA: Diagnosis not present

## 2019-01-04 DIAGNOSIS — I1310 Hypertensive heart and chronic kidney disease without heart failure, with stage 1 through stage 4 chronic kidney disease, or unspecified chronic kidney disease: Secondary | ICD-10-CM | POA: Diagnosis not present

## 2019-01-04 DIAGNOSIS — R2689 Other abnormalities of gait and mobility: Secondary | ICD-10-CM | POA: Diagnosis not present

## 2019-01-04 DIAGNOSIS — I5032 Chronic diastolic (congestive) heart failure: Secondary | ICD-10-CM | POA: Diagnosis not present

## 2019-01-04 DIAGNOSIS — Z6841 Body Mass Index (BMI) 40.0 and over, adult: Secondary | ICD-10-CM | POA: Diagnosis not present

## 2019-01-04 DIAGNOSIS — F17210 Nicotine dependence, cigarettes, uncomplicated: Secondary | ICD-10-CM | POA: Diagnosis not present

## 2019-01-04 DIAGNOSIS — Z794 Long term (current) use of insulin: Secondary | ICD-10-CM | POA: Diagnosis not present

## 2019-01-04 DIAGNOSIS — E1122 Type 2 diabetes mellitus with diabetic chronic kidney disease: Secondary | ICD-10-CM | POA: Diagnosis not present

## 2019-01-04 DIAGNOSIS — Z7901 Long term (current) use of anticoagulants: Secondary | ICD-10-CM | POA: Diagnosis not present

## 2019-01-04 DIAGNOSIS — N183 Chronic kidney disease, stage 3 unspecified: Secondary | ICD-10-CM | POA: Diagnosis not present

## 2019-01-06 DIAGNOSIS — Z0001 Encounter for general adult medical examination with abnormal findings: Secondary | ICD-10-CM | POA: Diagnosis not present

## 2019-01-06 DIAGNOSIS — E1165 Type 2 diabetes mellitus with hyperglycemia: Secondary | ICD-10-CM | POA: Diagnosis not present

## 2019-01-06 DIAGNOSIS — I110 Hypertensive heart disease with heart failure: Secondary | ICD-10-CM | POA: Diagnosis not present

## 2019-01-06 DIAGNOSIS — N183 Chronic kidney disease, stage 3 unspecified: Secondary | ICD-10-CM | POA: Diagnosis not present

## 2019-01-09 DIAGNOSIS — I5032 Chronic diastolic (congestive) heart failure: Secondary | ICD-10-CM | POA: Diagnosis not present

## 2019-01-09 DIAGNOSIS — E1122 Type 2 diabetes mellitus with diabetic chronic kidney disease: Secondary | ICD-10-CM | POA: Diagnosis not present

## 2019-01-09 DIAGNOSIS — N183 Chronic kidney disease, stage 3 unspecified: Secondary | ICD-10-CM | POA: Diagnosis not present

## 2019-01-09 DIAGNOSIS — I1310 Hypertensive heart and chronic kidney disease without heart failure, with stage 1 through stage 4 chronic kidney disease, or unspecified chronic kidney disease: Secondary | ICD-10-CM | POA: Diagnosis not present

## 2019-01-09 DIAGNOSIS — J449 Chronic obstructive pulmonary disease, unspecified: Secondary | ICD-10-CM | POA: Diagnosis not present

## 2019-01-09 DIAGNOSIS — I4821 Permanent atrial fibrillation: Secondary | ICD-10-CM | POA: Diagnosis not present

## 2019-01-10 ENCOUNTER — Other Ambulatory Visit: Payer: Self-pay

## 2019-01-10 ENCOUNTER — Emergency Department (HOSPITAL_COMMUNITY)
Admission: EM | Admit: 2019-01-10 | Discharge: 2019-01-10 | Disposition: A | Discharge status: Skilled Nursing Facility | Payer: Medicare Other | Attending: Emergency Medicine | Admitting: Emergency Medicine

## 2019-01-10 ENCOUNTER — Encounter (HOSPITAL_COMMUNITY): Payer: Self-pay | Admitting: Emergency Medicine

## 2019-01-10 DIAGNOSIS — E1122 Type 2 diabetes mellitus with diabetic chronic kidney disease: Secondary | ICD-10-CM | POA: Insufficient documentation

## 2019-01-10 DIAGNOSIS — I1310 Hypertensive heart and chronic kidney disease without heart failure, with stage 1 through stage 4 chronic kidney disease, or unspecified chronic kidney disease: Secondary | ICD-10-CM | POA: Diagnosis not present

## 2019-01-10 DIAGNOSIS — Z87891 Personal history of nicotine dependence: Secondary | ICD-10-CM | POA: Diagnosis not present

## 2019-01-10 DIAGNOSIS — R2243 Localized swelling, mass and lump, lower limb, bilateral: Secondary | ICD-10-CM | POA: Diagnosis present

## 2019-01-10 DIAGNOSIS — I132 Hypertensive heart and chronic kidney disease with heart failure and with stage 5 chronic kidney disease, or end stage renal disease: Secondary | ICD-10-CM | POA: Insufficient documentation

## 2019-01-10 DIAGNOSIS — I4821 Permanent atrial fibrillation: Secondary | ICD-10-CM | POA: Diagnosis not present

## 2019-01-10 DIAGNOSIS — Z79899 Other long term (current) drug therapy: Secondary | ICD-10-CM | POA: Insufficient documentation

## 2019-01-10 DIAGNOSIS — N186 End stage renal disease: Secondary | ICD-10-CM | POA: Diagnosis not present

## 2019-01-10 DIAGNOSIS — I5032 Chronic diastolic (congestive) heart failure: Secondary | ICD-10-CM | POA: Insufficient documentation

## 2019-01-10 DIAGNOSIS — Z992 Dependence on renal dialysis: Secondary | ICD-10-CM | POA: Diagnosis not present

## 2019-01-10 DIAGNOSIS — R600 Localized edema: Secondary | ICD-10-CM | POA: Diagnosis not present

## 2019-01-10 DIAGNOSIS — J449 Chronic obstructive pulmonary disease, unspecified: Secondary | ICD-10-CM | POA: Diagnosis not present

## 2019-01-10 DIAGNOSIS — N183 Chronic kidney disease, stage 3 unspecified: Secondary | ICD-10-CM | POA: Diagnosis not present

## 2019-01-10 DIAGNOSIS — Z794 Long term (current) use of insulin: Secondary | ICD-10-CM | POA: Insufficient documentation

## 2019-01-10 MED ORDER — FUROSEMIDE 40 MG PO TABS: 40.0000 mg | ORAL_TABLET | Freq: Every day | ORAL | 0 refills | Status: DC

## 2019-01-10 MED ORDER — FUROSEMIDE 40 MG PO TABS
40.0000 mg | ORAL_TABLET | Freq: Once | ORAL | Status: AC
  Administered 2019-01-10: 40 mg via ORAL
  Filled 2019-01-10: qty 1

## 2019-01-10 NOTE — ED Notes (Signed)
Debra from [facility] is coming to transport pt back to facility.

## 2019-01-10 NOTE — Discharge Instructions (Addendum)
No tests were necessary tonight.  We will start you on a fluid pill.  Take 1 daily in the morning.  This will make you urinate more.  The prescription was sent to the pharmacy.  Expect to urinate more.

## 2019-01-10 NOTE — ED Provider Notes (Signed)
[facility] Provider Note   CSN: [id number] Arrival date & time: 01/10/19  1414     History   Chief Complaint Chief Complaint  Patient presents with  . Leg Swelling    HPI [name] is a 63 y.o. female.     Level 5 caveat for mental retardation.  Chief complaint lower extremity swelling for 2 weeks.  No chest pain or dyspnea.  Multiple health problems including morbid obesity, atrial fib, chronic CHF, CKD, diabetes, hypertension, several others.  She is in no extreme distress.  Medication list does not reveal any obvious diuretics.     Past Medical History:  Diagnosis Date  . Arthritis   . Asthma   . Atrial fibrillation ([place])   . Chronic diastolic CHF (congestive heart failure) ([place])   . CKD (chronic kidney disease), stage III   . Diabetes mellitus type 2 in obese ([place])   . Hypertension   . Iron deficiency anemia 10/16/2010  . Mental handicap 10/16/2010  . Mild CAD 2013  . Morbid obesity ([place])   . OSA (obstructive sleep apnea)     Patient Active Problem List   Diagnosis Date Noted  . ESRD on dialysis ([place]) 08/23/2018  . Carcinoid tumor determined by biopsy of lung   . Aortic valve mass   . Respiratory arrest ([place]) 05/21/2018  . Acute renal failure (ARF) ([place]) 05/21/2018  . CAP (community acquired pneumonia) 05/13/2018  . Mixed hyperlipidemia 11/23/2017  . Hypercortisolemia 04/27/2016  . Morbid obesity due to excess calories ([place]) 07/02/2015  . Uncontrolled type 2 diabetes mellitus with stage 4 chronic kidney disease ([place]) 02/26/2015  . Acute kidney injury superimposed on chronic kidney disease ([place]) 10/07/2011  . Cutaneous candidiasis 10/21/2010  . Essential hypertension, benign 10/21/2010  . Iron deficiency anemia 10/16/2010  . Mental handicap 10/16/2010    Past Surgical History:  Procedure Laterality Date  . ABDOMINAL HYSTERECTOMY    . AV FISTULA PLACEMENT Right 06/06/2018   Procedure: ARTERIOVENOUS (AV) FISTULA CREATION;   Surgeon: Marty Heck, MD;  Location: [facility];  Service: Vascular;  Laterality: Right;  . CESAREAN SECTION    . CHOLECYSTECTOMY    . COLONOSCOPY  08/2010   normal TI, sigmoid polyp (adenoma ). Next TCS due  08/2015,  . ESOPHAGOGASTRODUODENOSCOPY  08/2010   antral and duodenal erosions s/p bx (chronic gastritis, no h.pylori, no celiac dz ), hiatal hernia  . FISTULA SUPERFICIALIZATION Right 08/25/2018   Procedure: FISTULA SUPERFICIALIZATION RIGHT ARM;  Surgeon: Waynetta Sandy, MD;  Location: [facility];  Service: Cardiovascular;  Laterality: Right;  . IR FLUORO GUIDE CV LINE RIGHT  05/31/2018  . IR US GUIDE VASC ACCESS RIGHT  05/31/2018  . KNEE SURGERY     right knee @ 63 years of age  . LEFT AND RIGHT HEART CATHETERIZATION WITH CORONARY ANGIOGRAM N/A 09/21/2011   Procedure: LEFT AND RIGHT HEART CATHETERIZATION WITH CORONARY ANGIOGRAM;  Surgeon: Birdie Riddle, MD;  Location: [facility];  Service: Cardiovascular;  Laterality: N/A;     OB History   No obstetric history on file.      Home Medications    Prior to Admission medications   Medication Sig Start Date End Date Taking? Authorizing Provider  ACCU-CHEK AVIVA PLUS test strip USE TO TEST THREE TIMES DAILY. 02/23/18   Cassandria Anger, MD  ACCU-CHEK SOFTCLIX LANCETS lancets USE TO TEST THREE TIMES DAILY. 02/23/18   Cassandria Anger, MD  acetaminophen (TYLENOL) 325 MG tablet Take  325 mg by mouth every 12 (twelve) hours as needed for moderate pain.    [provider]  albuterol (PROVENTIL HFA;VENTOLIN HFA) 108 (90 Base) MCG/ACT inhaler Inhale 2 puffs into the lungs every 6 (six) hours as needed for shortness of breath.    [provider]  apixaban (ELIQUIS) 5 MG TABS tablet Take 1 tablet (5 mg total) by mouth 2 (two) times daily. 06/09/18   Dessa Phi, DO  diltiazem (CARDIZEM CD) 240 MG 24 hr capsule Take 1 capsule (240 mg total) by mouth daily. 06/10/18   Dessa Phi, DO  fluticasone (FLONASE) 50  MCG/ACT nasal spray Place 2 sprays into both nostrils daily as needed for allergies or rhinitis.    [provider]  Fluticasone-Umeclidin-Vilant (TRELEGY ELLIPTA) 100-62.5-25 MCG/INH AEPB Inhale 1 puff into the lungs daily.     [provider]  furosemide (LASIX) 40 MG tablet Take 1 tablet (40 mg total) by mouth daily. 01/10/19   Nat Christen, MD  guaiFENesin (ROBITUSSIN) 100 MG/5ML SOLN Take 5 mLs (100 mg total) by mouth every 4 (four) hours as needed for cough or to loosen phlegm. 05/16/18   Manuella Ghazi, Pratik D, DO  HUMULIN R U-500 KWIKPEN 500 UNIT/ML kwikpen INJECT 20 units SUBCUTANEOUSLY 3 TIMES DAILY WITH MEALS WHEN BLOOD SUGAR IS GREATER THAN 90mg /dL. 12/08/18   Cassandria Anger, MD  hydrALAZINE (APRESOLINE) 25 MG tablet Take 1 tablet (25 mg total) by mouth every 8 (eight) hours for 30 days. Patient taking differently: Take 25 mg by mouth 3 (three) times daily.  06/09/18 10/28/18  Dessa Phi, DO  ipratropium-albuterol (DUONEB) 0.5-2.5 (3) MG/3ML SOLN Take 3 mLs by nebulization every 6 (six) hours as needed (shortness of breath).    [provider]  isosorbide dinitrate (ISORDIL) 10 MG tablet Take 1 tablet (10 mg total) by mouth 3 (three) times daily. 06/09/18   Dessa Phi, DO  loratadine (CLARITIN) 10 MG tablet Take 10 mg by mouth daily.    [provider]  metoprolol tartrate (LOPRESSOR) 100 MG tablet Take 100 mg by mouth daily.    [provider]  NOVOFINE AUTOCOVER 30G X 8 MM MISC USE AS DIRECTED FOR 3 TIMES DAILY INSULIN ADMINISTRATION. 04/04/18   Cassandria Anger, MD  oxyCODONE-acetaminophen (PERCOCET/ROXICET) 5-325 MG tablet Take 1 tablet by mouth every 6 (six) hours as needed. 08/25/18   Ulyses Amor, PA-C  SURE COMFORT PEN NEEDLES 31G X 8 MM MISC USE AS DIRECTED WITH LEVEMIR AND NOVOLOG. UP TO FOUR TIMES DAILY. 02/12/17   Cassandria Anger, MD  triamcinolone cream (KENALOG) 0.5 % Apply 1 application topically every 12 (twelve) hours  as needed (allergic reaction).    [provider]    Family History Family History  Problem Relation Age of Onset  . Diabetes Mother   . Hypertension Mother   . Heart failure Mother   . Hypertension Father   . Atrial fibrillation Father   . Kidney disease Other        son reportedly has had cyst on kidney and lung s/p surgery at [facility]  . Hypertension Son   . Colon cancer Neg Hx     Social History Social History   Tobacco Use  . Smoking status: Former Smoker    Packs/day: 1.00    Years: 0.00    Pack years: 0.00    Types: Cigarettes    Quit date: 04/11/1996    Years since quitting: 22.7  . Smokeless tobacco: Never Used  .  Tobacco comment: quit a couple year ago  Substance Use Topics  . Alcohol use: No  . Drug use: No     Allergies   Patient has no known allergies.   Review of Systems Review of Systems  Reason unable to perform ROS: MR.     Physical Exam Updated Vital Signs BP (!) 181/85   Pulse 64   Temp (!) 97.5 F (36.4 C) (Oral)   Resp 20   Ht 5\' 4"  (1.626 m)   Wt 108.9 kg   SpO2 96%   BMI 41.20 kg/m   Physical Exam Vitals signs and nursing note reviewed.  Constitutional:      Appearance: She is well-developed.     Comments: nad  HENT:     Head: Normocephalic and atraumatic.  Eyes:     Conjunctiva/sclera: Conjunctivae normal.  Neck:     Musculoskeletal: Neck supple.  Cardiovascular:     Rate and Rhythm: Normal rate and regular rhythm.     Comments: No dyspnea or tachypnea noted. Pulmonary:     Effort: Pulmonary effort is normal.     Breath sounds: Normal breath sounds.  Abdominal:     General: Bowel sounds are normal.     Palpations: Abdomen is soft.  Musculoskeletal: Normal range of motion.  Skin:    General: Skin is warm and dry.     Comments: Lower extremity edema noted 3-4+ (appears chronic)  Neurological:     Mental Status: She is alert and oriented to person, place, and time.  Psychiatric:        Behavior: Behavior  normal.      ED Treatments / Results  Labs (all labs ordered are listed, but only abnormal results are displayed) Labs Reviewed - No data to display  EKG None  Radiology No results found.  Procedures Procedures (including critical care time)  Medications Ordered in ED Medications  furosemide (LASIX) tablet 40 mg (40 mg Oral Given 01/10/19 2019)     Initial Impression / Assessment and Plan / ED Course  I have reviewed the triage vital signs and the nursing notes.  Pertinent labs & imaging results that were available during my care of the patient were reviewed by me and considered in my medical decision making (see chart for details).       Patient with multiple health problems presents with lower extremity swelling bilateral for several weeks.  She is in no respiratory distress whatsoever.  Will initiate Lasix 40 mg daily.  She has primary care follow-up.   Final Clinical Impressions(s) / ED Diagnoses   Final diagnoses:  Leg edema    ED Discharge Orders         Ordered    furosemide (LASIX) 40 MG tablet  Daily     01/10/19 2033           Nat Christen, MD 01/10/19 2042

## 2019-01-10 NOTE — ED Notes (Signed)
Pt able to urinate prior to discharge.

## 2019-01-10 NOTE — ED Triage Notes (Signed)
Pt c/o bilateral lower extremity swelling x 2 weeks. Pt with hx of CHF and takes a fluid pill daily.

## 2019-01-12 DIAGNOSIS — N183 Chronic kidney disease, stage 3 unspecified: Secondary | ICD-10-CM | POA: Diagnosis not present

## 2019-01-12 DIAGNOSIS — E1122 Type 2 diabetes mellitus with diabetic chronic kidney disease: Secondary | ICD-10-CM | POA: Diagnosis not present

## 2019-01-12 DIAGNOSIS — I1310 Hypertensive heart and chronic kidney disease without heart failure, with stage 1 through stage 4 chronic kidney disease, or unspecified chronic kidney disease: Secondary | ICD-10-CM | POA: Diagnosis not present

## 2019-01-12 DIAGNOSIS — I4821 Permanent atrial fibrillation: Secondary | ICD-10-CM | POA: Diagnosis not present

## 2019-01-12 DIAGNOSIS — J449 Chronic obstructive pulmonary disease, unspecified: Secondary | ICD-10-CM | POA: Diagnosis not present

## 2019-01-12 DIAGNOSIS — I5032 Chronic diastolic (congestive) heart failure: Secondary | ICD-10-CM | POA: Diagnosis not present

## 2019-01-13 DIAGNOSIS — I5032 Chronic diastolic (congestive) heart failure: Secondary | ICD-10-CM | POA: Diagnosis not present

## 2019-01-13 DIAGNOSIS — I4821 Permanent atrial fibrillation: Secondary | ICD-10-CM | POA: Diagnosis not present

## 2019-01-13 DIAGNOSIS — I110 Hypertensive heart disease with heart failure: Secondary | ICD-10-CM | POA: Diagnosis not present

## 2019-01-13 DIAGNOSIS — N1831 Chronic kidney disease, stage 3a: Secondary | ICD-10-CM | POA: Diagnosis not present

## 2019-01-16 DIAGNOSIS — I4821 Permanent atrial fibrillation: Secondary | ICD-10-CM | POA: Diagnosis not present

## 2019-01-16 DIAGNOSIS — I1310 Hypertensive heart and chronic kidney disease without heart failure, with stage 1 through stage 4 chronic kidney disease, or unspecified chronic kidney disease: Secondary | ICD-10-CM | POA: Diagnosis not present

## 2019-01-16 DIAGNOSIS — J449 Chronic obstructive pulmonary disease, unspecified: Secondary | ICD-10-CM | POA: Diagnosis not present

## 2019-01-16 DIAGNOSIS — E1122 Type 2 diabetes mellitus with diabetic chronic kidney disease: Secondary | ICD-10-CM | POA: Diagnosis not present

## 2019-01-16 DIAGNOSIS — I5032 Chronic diastolic (congestive) heart failure: Secondary | ICD-10-CM | POA: Diagnosis not present

## 2019-01-16 DIAGNOSIS — N183 Chronic kidney disease, stage 3 unspecified: Secondary | ICD-10-CM | POA: Diagnosis not present

## 2019-01-17 DIAGNOSIS — N1831 Chronic kidney disease, stage 3a: Secondary | ICD-10-CM | POA: Diagnosis not present

## 2019-01-17 DIAGNOSIS — R600 Localized edema: Secondary | ICD-10-CM | POA: Diagnosis not present

## 2019-01-17 DIAGNOSIS — E1122 Type 2 diabetes mellitus with diabetic chronic kidney disease: Secondary | ICD-10-CM | POA: Diagnosis not present

## 2019-01-17 DIAGNOSIS — L03116 Cellulitis of left lower limb: Secondary | ICD-10-CM | POA: Diagnosis not present

## 2019-01-17 DIAGNOSIS — I10 Essential (primary) hypertension: Secondary | ICD-10-CM | POA: Diagnosis not present

## 2019-01-18 ENCOUNTER — Other Ambulatory Visit: Payer: Self-pay | Admitting: "Endocrinology

## 2019-01-18 DIAGNOSIS — Z794 Long term (current) use of insulin: Secondary | ICD-10-CM

## 2019-01-18 DIAGNOSIS — N183 Chronic kidney disease, stage 3 unspecified: Secondary | ICD-10-CM | POA: Diagnosis not present

## 2019-01-18 DIAGNOSIS — I4821 Permanent atrial fibrillation: Secondary | ICD-10-CM | POA: Diagnosis not present

## 2019-01-18 DIAGNOSIS — I5032 Chronic diastolic (congestive) heart failure: Secondary | ICD-10-CM | POA: Diagnosis not present

## 2019-01-18 DIAGNOSIS — E1122 Type 2 diabetes mellitus with diabetic chronic kidney disease: Secondary | ICD-10-CM | POA: Diagnosis not present

## 2019-01-18 DIAGNOSIS — N184 Chronic kidney disease, stage 4 (severe): Secondary | ICD-10-CM

## 2019-01-18 DIAGNOSIS — I1310 Hypertensive heart and chronic kidney disease without heart failure, with stage 1 through stage 4 chronic kidney disease, or unspecified chronic kidney disease: Secondary | ICD-10-CM | POA: Diagnosis not present

## 2019-01-18 DIAGNOSIS — J449 Chronic obstructive pulmonary disease, unspecified: Secondary | ICD-10-CM | POA: Diagnosis not present

## 2019-01-19 DIAGNOSIS — I1310 Hypertensive heart and chronic kidney disease without heart failure, with stage 1 through stage 4 chronic kidney disease, or unspecified chronic kidney disease: Secondary | ICD-10-CM | POA: Diagnosis not present

## 2019-01-19 DIAGNOSIS — N189 Chronic kidney disease, unspecified: Secondary | ICD-10-CM | POA: Diagnosis not present

## 2019-01-19 DIAGNOSIS — D631 Anemia in chronic kidney disease: Secondary | ICD-10-CM | POA: Diagnosis not present

## 2019-01-19 DIAGNOSIS — E1122 Type 2 diabetes mellitus with diabetic chronic kidney disease: Secondary | ICD-10-CM | POA: Diagnosis not present

## 2019-01-19 DIAGNOSIS — I10 Essential (primary) hypertension: Secondary | ICD-10-CM | POA: Diagnosis not present

## 2019-01-19 DIAGNOSIS — I5032 Chronic diastolic (congestive) heart failure: Secondary | ICD-10-CM | POA: Diagnosis not present

## 2019-01-19 DIAGNOSIS — N183 Chronic kidney disease, stage 3 unspecified: Secondary | ICD-10-CM | POA: Diagnosis not present

## 2019-01-19 DIAGNOSIS — I4821 Permanent atrial fibrillation: Secondary | ICD-10-CM | POA: Diagnosis not present

## 2019-01-19 DIAGNOSIS — E559 Vitamin D deficiency, unspecified: Secondary | ICD-10-CM | POA: Diagnosis not present

## 2019-01-19 DIAGNOSIS — J449 Chronic obstructive pulmonary disease, unspecified: Secondary | ICD-10-CM | POA: Diagnosis not present

## 2019-01-19 DIAGNOSIS — N178 Other acute kidney failure: Secondary | ICD-10-CM | POA: Diagnosis not present

## 2019-01-23 DIAGNOSIS — I4821 Permanent atrial fibrillation: Secondary | ICD-10-CM | POA: Diagnosis not present

## 2019-01-23 DIAGNOSIS — N183 Chronic kidney disease, stage 3 unspecified: Secondary | ICD-10-CM | POA: Diagnosis not present

## 2019-01-23 DIAGNOSIS — E1122 Type 2 diabetes mellitus with diabetic chronic kidney disease: Secondary | ICD-10-CM | POA: Diagnosis not present

## 2019-01-23 DIAGNOSIS — J449 Chronic obstructive pulmonary disease, unspecified: Secondary | ICD-10-CM | POA: Diagnosis not present

## 2019-01-23 DIAGNOSIS — I1310 Hypertensive heart and chronic kidney disease without heart failure, with stage 1 through stage 4 chronic kidney disease, or unspecified chronic kidney disease: Secondary | ICD-10-CM | POA: Diagnosis not present

## 2019-01-23 DIAGNOSIS — I5032 Chronic diastolic (congestive) heart failure: Secondary | ICD-10-CM | POA: Diagnosis not present

## 2019-01-24 ENCOUNTER — Telehealth: Payer: Self-pay | Admitting: Cardiovascular Disease

## 2019-01-24 ENCOUNTER — Other Ambulatory Visit: Payer: Self-pay | Admitting: "Endocrinology

## 2019-01-24 DIAGNOSIS — N184 Chronic kidney disease, stage 4 (severe): Secondary | ICD-10-CM | POA: Diagnosis not present

## 2019-01-24 DIAGNOSIS — Z794 Long term (current) use of insulin: Secondary | ICD-10-CM | POA: Diagnosis not present

## 2019-01-24 DIAGNOSIS — E1122 Type 2 diabetes mellitus with diabetic chronic kidney disease: Secondary | ICD-10-CM | POA: Diagnosis not present

## 2019-01-24 NOTE — Telephone Encounter (Signed)
retaining a lot of fluid and legs are leaking really bad. Changing bandages 3 times a week now due to this

## 2019-01-24 NOTE — Telephone Encounter (Signed)
Spoke with Sunday Spillers at assisted living home. Who states that the pt has had increased swelling to her legs for the last 2 weeks. She was sent to the hospital on last week by Dr. Legrand Rams and medications increased. Lasix to 40 mg  BID and K-Dur to 20 mEq BID.  Sunday Spillers states that dressing changes have increased from weekly to three times weekly d/t legs weeping. Sunday Spillers denies that pt is having increased SOB. Please advise.

## 2019-01-25 DIAGNOSIS — I5032 Chronic diastolic (congestive) heart failure: Secondary | ICD-10-CM | POA: Diagnosis not present

## 2019-01-25 DIAGNOSIS — I1310 Hypertensive heart and chronic kidney disease without heart failure, with stage 1 through stage 4 chronic kidney disease, or unspecified chronic kidney disease: Secondary | ICD-10-CM | POA: Diagnosis not present

## 2019-01-25 DIAGNOSIS — I4821 Permanent atrial fibrillation: Secondary | ICD-10-CM | POA: Diagnosis not present

## 2019-01-25 DIAGNOSIS — J449 Chronic obstructive pulmonary disease, unspecified: Secondary | ICD-10-CM | POA: Diagnosis not present

## 2019-01-25 DIAGNOSIS — N183 Chronic kidney disease, stage 3 unspecified: Secondary | ICD-10-CM | POA: Diagnosis not present

## 2019-01-25 DIAGNOSIS — E1122 Type 2 diabetes mellitus with diabetic chronic kidney disease: Secondary | ICD-10-CM | POA: Diagnosis not present

## 2019-01-25 LAB — COMPREHENSIVE METABOLIC PANEL
ALT: 17 IU/L (ref 0–32)
AST: 20 IU/L (ref 0–40)
Albumin/Globulin Ratio: 1.6 (ref 1.2–2.2)
Albumin: 3.9 g/dL (ref 3.8–4.8)
Alkaline Phosphatase: 117 IU/L (ref 39–117)
BUN/Creatinine Ratio: 31 — ABNORMAL HIGH (ref 12–28)
BUN: 45 mg/dL — ABNORMAL HIGH (ref 8–27)
Bilirubin Total: 0.5 mg/dL (ref 0.0–1.2)
CO2: 28 mmol/L (ref 20–29)
Calcium: 9.3 mg/dL (ref 8.7–10.3)
Chloride: 97 mmol/L (ref 96–106)
Creatinine, Ser: 1.44 mg/dL — ABNORMAL HIGH (ref 0.57–1.00)
GFR calc Af Amer: 45 mL/min/{1.73_m2} — ABNORMAL LOW (ref >59)
GFR calc non Af Amer: 39 mL/min/{1.73_m2} — ABNORMAL LOW (ref >59)
Globulin, Total: 2.5 g/dL (ref 1.5–4.5)
Glucose: 150 mg/dL — ABNORMAL HIGH (ref 65–99)
Potassium: 3.9 mmol/L (ref 3.5–5.2)
Sodium: 141 mmol/L (ref 134–144)
Total Protein: 6.4 g/dL (ref 6.0–8.5)

## 2019-01-25 LAB — SPECIMEN STATUS REPORT

## 2019-01-25 NOTE — Telephone Encounter (Signed)
Returning call --Sunday Spillers @ [facility] [phone]

## 2019-01-25 NOTE — Telephone Encounter (Signed)
Spoke with Dr.Koneswaran and he states " She has ESRD and is on dialysis. not sure why she's on lasix. she needs to be dialyzed if her legs are swollen more than usual. I would have them call her nephrologist asap". Sunday Spillers notified of his recombinations.

## 2019-01-25 NOTE — Telephone Encounter (Addendum)
Returned call to [name] Notified again to notify nephrologist of current sx.

## 2019-01-26 DIAGNOSIS — I10 Essential (primary) hypertension: Secondary | ICD-10-CM | POA: Diagnosis not present

## 2019-01-26 DIAGNOSIS — I4821 Permanent atrial fibrillation: Secondary | ICD-10-CM | POA: Diagnosis not present

## 2019-01-26 DIAGNOSIS — I5032 Chronic diastolic (congestive) heart failure: Secondary | ICD-10-CM | POA: Diagnosis not present

## 2019-01-26 DIAGNOSIS — N1831 Chronic kidney disease, stage 3a: Secondary | ICD-10-CM | POA: Diagnosis not present

## 2019-01-26 DIAGNOSIS — J449 Chronic obstructive pulmonary disease, unspecified: Secondary | ICD-10-CM | POA: Diagnosis not present

## 2019-01-27 DIAGNOSIS — I1310 Hypertensive heart and chronic kidney disease without heart failure, with stage 1 through stage 4 chronic kidney disease, or unspecified chronic kidney disease: Secondary | ICD-10-CM | POA: Diagnosis not present

## 2019-01-27 DIAGNOSIS — I4821 Permanent atrial fibrillation: Secondary | ICD-10-CM | POA: Diagnosis not present

## 2019-01-27 DIAGNOSIS — N183 Chronic kidney disease, stage 3 unspecified: Secondary | ICD-10-CM | POA: Diagnosis not present

## 2019-01-27 DIAGNOSIS — I5032 Chronic diastolic (congestive) heart failure: Secondary | ICD-10-CM | POA: Diagnosis not present

## 2019-01-27 DIAGNOSIS — E1122 Type 2 diabetes mellitus with diabetic chronic kidney disease: Secondary | ICD-10-CM | POA: Diagnosis not present

## 2019-01-27 DIAGNOSIS — J449 Chronic obstructive pulmonary disease, unspecified: Secondary | ICD-10-CM | POA: Diagnosis not present

## 2019-01-30 DIAGNOSIS — J449 Chronic obstructive pulmonary disease, unspecified: Secondary | ICD-10-CM | POA: Diagnosis not present

## 2019-01-30 DIAGNOSIS — N183 Chronic kidney disease, stage 3 unspecified: Secondary | ICD-10-CM | POA: Diagnosis not present

## 2019-01-30 DIAGNOSIS — I5032 Chronic diastolic (congestive) heart failure: Secondary | ICD-10-CM | POA: Diagnosis not present

## 2019-01-30 DIAGNOSIS — I4821 Permanent atrial fibrillation: Secondary | ICD-10-CM | POA: Diagnosis not present

## 2019-01-30 DIAGNOSIS — I1310 Hypertensive heart and chronic kidney disease without heart failure, with stage 1 through stage 4 chronic kidney disease, or unspecified chronic kidney disease: Secondary | ICD-10-CM | POA: Diagnosis not present

## 2019-01-30 DIAGNOSIS — E1122 Type 2 diabetes mellitus with diabetic chronic kidney disease: Secondary | ICD-10-CM | POA: Diagnosis not present

## 2019-01-31 ENCOUNTER — Ambulatory Visit (INDEPENDENT_AMBULATORY_CARE_PROVIDER_SITE_OTHER): Payer: Medicaid Other | Admitting: "Endocrinology

## 2019-01-31 ENCOUNTER — Other Ambulatory Visit: Payer: Self-pay

## 2019-01-31 ENCOUNTER — Encounter: Payer: Self-pay | Admitting: "Endocrinology

## 2019-01-31 VITALS — BP 127/67 | HR 60 | Ht 63.0 in

## 2019-01-31 DIAGNOSIS — E782 Mixed hyperlipidemia: Secondary | ICD-10-CM | POA: Diagnosis not present

## 2019-01-31 DIAGNOSIS — Z794 Long term (current) use of insulin: Secondary | ICD-10-CM

## 2019-01-31 DIAGNOSIS — N184 Chronic kidney disease, stage 4 (severe): Secondary | ICD-10-CM | POA: Diagnosis not present

## 2019-01-31 DIAGNOSIS — I10 Essential (primary) hypertension: Secondary | ICD-10-CM

## 2019-01-31 DIAGNOSIS — E1122 Type 2 diabetes mellitus with diabetic chronic kidney disease: Secondary | ICD-10-CM

## 2019-01-31 LAB — POCT GLYCOSYLATED HEMOGLOBIN (HGB A1C): Hemoglobin A1C: 6.2 % — AB (ref 4.0–5.6)

## 2019-01-31 NOTE — Progress Notes (Signed)
01/31/2019  Endocrinology follow-up note   Subjective:    Patient ID: [name], female    DOB: [birth year], PCP Rosita Fire, MD   Past Medical History:  Diagnosis Date  . Arthritis   . Asthma   . Atrial fibrillation ([place])   . Chronic diastolic CHF (congestive heart failure) ([place])   . CKD (chronic kidney disease), stage III   . Diabetes mellitus type 2 in obese ([place])   . Hypertension   . Iron deficiency anemia 10/16/2010  . Mental handicap 10/16/2010  . Mild CAD 2013  . Morbid obesity ([place])   . OSA (obstructive sleep apnea)    Past Surgical History:  Procedure Laterality Date  . ABDOMINAL HYSTERECTOMY    . AV FISTULA PLACEMENT Right 06/06/2018   Procedure: ARTERIOVENOUS (AV) FISTULA CREATION;  Surgeon: Marty Heck, MD;  Location: [facility];  Service: Vascular;  Laterality: Right;  . CESAREAN SECTION    . CHOLECYSTECTOMY    . COLONOSCOPY  08/2010   normal TI, sigmoid polyp (adenoma ). Next TCS due  08/2015,  . ESOPHAGOGASTRODUODENOSCOPY  08/2010   antral and duodenal erosions s/p bx (chronic gastritis, no h.pylori, no celiac dz ), hiatal hernia  . FISTULA SUPERFICIALIZATION Right 08/25/2018   Procedure: FISTULA SUPERFICIALIZATION RIGHT ARM;  Surgeon: Waynetta Sandy, MD;  Location: [facility];  Service: Cardiovascular;  Laterality: Right;  . IR FLUORO GUIDE CV LINE RIGHT  05/31/2018  . IR US GUIDE VASC ACCESS RIGHT  05/31/2018  . KNEE SURGERY     right knee @ 63 years of age  . LEFT AND RIGHT HEART CATHETERIZATION WITH CORONARY ANGIOGRAM N/A 09/21/2011   Procedure: LEFT AND RIGHT HEART CATHETERIZATION WITH CORONARY ANGIOGRAM;  Surgeon: Birdie Riddle, MD;  Location: [facility];  Service: Cardiovascular;  Laterality: N/A;   Social History   Socioeconomic History  . Marital status: Divorced    Spouse name: Not on file  . Number of children: 1  . Years of education: Not on file  . Highest education level: Not on file  Occupational History    Employer:  UNEMPLOYED  Social Needs  . Financial resource strain: Not on file  . Food insecurity    Worry: Not on file    Inability: Not on file  . Transportation needs    Medical: Not on file    Non-medical: Not on file  Tobacco Use  . Smoking status: Former Smoker    Packs/day: 1.00    Years: 0.00    Pack years: 0.00    Types: Cigarettes    Quit date: 04/11/1996    Years since quitting: 22.8  . Smokeless tobacco: Never Used  . Tobacco comment: quit a couple year ago  Substance and Sexual Activity  . Alcohol use: No  . Drug use: No  . Sexual activity: Never  Lifestyle  . Physical activity    Days per week: Not on file    Minutes per session: Not on file  . Stress: Not on file  Relationships  . Social Herbalist on phone: Not on file    Gets together: Not on file    Attends religious service: Not on file    Active member of club or organization: Not on file    Attends meetings of clubs or organizations: Not on file    Relationship status: Not on file  Other Topics Concern  . Not on file  Social History Narrative   Lives with parents.  Outpatient Encounter Medications as of 01/31/2019  Medication Sig  . ACCU-CHEK AVIVA PLUS test strip USE TO TEST THREE TIMES DAILY.  Marland Kitchen ACCU-CHEK SOFTCLIX LANCETS lancets USE TO TEST THREE TIMES DAILY.  Marland Kitchen acetaminophen (TYLENOL) 325 MG tablet Take 325 mg by mouth every 12 (twelve) hours as needed for moderate pain.  Marland Kitchen albuterol (PROVENTIL HFA;VENTOLIN HFA) 108 (90 Base) MCG/ACT inhaler Inhale 2 puffs into the lungs every 6 (six) hours as needed for shortness of breath.  Marland Kitchen apixaban (ELIQUIS) 5 MG TABS tablet Take 1 tablet (5 mg total) by mouth 2 (two) times daily.  Marland Kitchen diltiazem (CARDIZEM CD) 240 MG 24 hr capsule Take 1 capsule (240 mg total) by mouth daily.  . fluticasone (FLONASE) 50 MCG/ACT nasal spray Place 2 sprays into both nostrils daily as needed for allergies or rhinitis.  . Fluticasone-Umeclidin-Vilant (TRELEGY ELLIPTA) 100-62.5-25  MCG/INH AEPB Inhale 1 puff into the lungs daily.   . furosemide (LASIX) 40 MG tablet Take 1 tablet (40 mg total) by mouth daily.  Marland Kitchen guaiFENesin (ROBITUSSIN) 100 MG/5ML SOLN Take 5 mLs (100 mg total) by mouth every 4 (four) hours as needed for cough or to loosen phlegm.  Marland Kitchen HUMULIN R U-500 KWIKPEN 500 UNIT/ML kwikpen INJECT 20 units SUBCUTANEOUSLY 3 TIMES DAILY WITH MEALS WHEN BLOOD SUGAR IS GREATER THAN 90mg /dL.  . hydrALAZINE (APRESOLINE) 25 MG tablet Take 1 tablet (25 mg total) by mouth every 8 (eight) hours for 30 days. (Patient taking differently: Take 25 mg by mouth 3 (three) times daily. )  . ipratropium-albuterol (DUONEB) 0.5-2.5 (3) MG/3ML SOLN Take 3 mLs by nebulization every 6 (six) hours as needed (shortness of breath).  . isosorbide dinitrate (ISORDIL) 10 MG tablet Take 1 tablet (10 mg total) by mouth 3 (three) times daily.  Marland Kitchen loratadine (CLARITIN) 10 MG tablet Take 10 mg by mouth daily.  . metoprolol tartrate (LOPRESSOR) 100 MG tablet Take 100 mg by mouth daily.  Marland Kitchen NOVOFINE AUTOCOVER 30G X 8 MM MISC USE AS DIRECTED FOR 3 TIMES DAILY INSULIN ADMINISTRATION.  Marland Kitchen oxyCODONE-acetaminophen (PERCOCET/ROXICET) 5-325 MG tablet Take 1 tablet by mouth every 6 (six) hours as needed.  . SURE COMFORT PEN NEEDLES 31G X 8 MM MISC USE AS DIRECTED WITH LEVEMIR AND NOVOLOG. UP TO FOUR TIMES DAILY.  Marland Kitchen triamcinolone cream (KENALOG) 0.5 % Apply 1 application topically every 12 (twelve) hours as needed (allergic reaction).   No facility-administered encounter medications on file as of 01/31/2019.    ALLERGIES: No Known Allergies VACCINATION STATUS: Immunization History  Administered Date(s) Administered  . Influenza,inj,Quad PF,6+ Mos 02/02/2013, 05/15/2018  . Pneumococcal Polysaccharide-23 05/15/2018    Diabetes She presents for her follow-up diabetic visit. She has type 2 diabetes mellitus. Onset time: she was diagnosed at approximate age of 63 years. Her disease course has been worsening. There are  no hypoglycemic associated symptoms. Pertinent negatives for hypoglycemia include no confusion, headaches, pallor or seizures. Associated symptoms include polydipsia and polyuria. Pertinent negatives for diabetes include no blurred vision, no chest pain, no fatigue and no polyphagia. There are no hypoglycemic complications. Symptoms are improving. Diabetic complications include nephropathy. Risk factors for coronary artery disease include diabetes mellitus, dyslipidemia, hypertension, obesity and sedentary lifestyle. Current diabetic treatment includes intensive insulin program. Her weight is fluctuating minimally. She is following a generally unhealthy diet. She has had a previous visit with a dietitian. She never participates in exercise. Her home blood glucose trend is decreasing steadily. Her breakfast blood glucose range is generally 140-180 mg/dl. Her lunch  blood glucose range is generally 140-180 mg/dl. Her dinner blood glucose range is generally 140-180 mg/dl. Her bedtime blood glucose range is generally 140-180 mg/dl. Her overall blood glucose range is 140-180 mg/dl. An ACE inhibitor/angiotensin II receptor blocker is being taken.  Hypertension This is a chronic problem. The current episode started more than 1 year ago. The problem is uncontrolled. Pertinent negatives include no blurred vision, chest pain, headaches, palpitations or shortness of breath. Risk factors for coronary artery disease include diabetes mellitus, dyslipidemia, obesity and sedentary lifestyle. Past treatments include ACE inhibitors. Hypertensive end-organ damage includes kidney disease. Identifiable causes of hypertension include chronic renal disease.  Hyperlipidemia This is a chronic problem. The current episode started more than 1 year ago. The problem is uncontrolled. Exacerbating diseases include chronic renal disease, diabetes and obesity. Pertinent negatives include no chest pain, myalgias or shortness of breath. Current  antihyperlipidemic treatment includes statins and fibric acid derivatives. Risk factors for coronary artery disease include diabetes mellitus, dyslipidemia, obesity, a sedentary lifestyle and hypertension.    Review of Systems  Constitutional: Positive for unexpected weight change. Negative for chills, fatigue and fever.  HENT: Negative for trouble swallowing and voice change.   Eyes: Negative for blurred vision and visual disturbance.  Respiratory: Negative for cough, shortness of breath and wheezing.   Cardiovascular: Negative for chest pain, palpitations and leg swelling.  Gastrointestinal: Negative for diarrhea, nausea and vomiting.  Endocrine: Positive for polydipsia and polyuria. Negative for cold intolerance, heat intolerance and polyphagia.  Musculoskeletal: Positive for gait problem. Negative for arthralgias and myalgias.       She is now wheelchair-bound due to disequilibrium and deconditioning.  Skin: Negative for color change, pallor, rash and wound.  Neurological: Negative for seizures and headaches.  Psychiatric/Behavioral: Negative for confusion and suicidal ideas.    Objective:    BP 127/67   Pulse 60   Ht 5\' 3"  (1.6 m)   BMI 42.51 kg/m   Wt Readings from Last 3 Encounters:  01/10/19 240 lb (108.9 kg)  10/28/18 239 lb (108.4 kg)  09/22/18 227 lb 8 oz (103.2 kg)    Physical Exam  Constitutional: She is oriented to person, place, and time. She appears well-developed.  HENT:  Head: Normocephalic and atraumatic.  Eyes: EOM are normal.  Neck: Normal range of motion. Neck supple. No tracheal deviation present. No thyromegaly present.  Cardiovascular: Normal rate.  Pulmonary/Chest: Effort normal.  Abdominal: There is no abdominal tenderness. There is no guarding.  Musculoskeletal:        General: Edema present.     Comments: Wheelchair-bound.  Neurological: She is alert and oriented to person, place, and time. No cranial nerve deficit. Coordination normal.  Skin:  Skin is dry. No rash noted. No erythema. No pallor.  Psychiatric: She has a normal mood and affect. Judgment normal.    Results for orders placed or performed in visit on 01/31/19  HgB A1c  Result Value Ref Range   Hemoglobin A1C 6.2 (A) 4.0 - 5.6 %   HbA1c POC (<> result, manual entry)     HbA1c, POC (prediabetic range)     HbA1c, POC (controlled diabetic range)     Complete Blood Count (Most recent): Lab Results  Component Value Date   WBC 8.5 06/09/2018   HGB 11.9 (L) 08/25/2018   HCT 35.0 (L) 08/25/2018   MCV 97.1 06/09/2018   PLT 293 06/09/2018   Chemistry (most recent): Diabetic Labs (most recent): Lab Results  Component Value Date  HGBA1C 6.2 (A) 01/31/2019   HGBA1C 7.4 (H) 02/16/2018   HGBA1C 6.2 (H) 11/18/2017   Lipid Panel     Component Value Date/Time   CHOL 141 06/02/2018 0029   TRIG 295 (H) 06/02/2018 0029   HDL 32 (L) 06/02/2018 0029   CHOLHDL 4.4 06/02/2018 0029   VLDL 59 (H) 06/02/2018 0029   LDLCALC 50 06/02/2018 0029   LDLCALC 80 04/07/2017 0928    Assessment & Plan:   1. Type 2 diabetes mellitus with stage 4 chronic kidney disease, with long-term current use of insulin (HCC)  -Her diabetes is  complicated by CKD, congestive heart failure,  obesity/sedentary life, and patient remains at extremely high risk for more acute and chronic complications of diabetes which include CAD, CVA, CKD, retinopathy, and neuropathy. These are all discussed in detail with the patient.  Patient is now in a nursing home, returns with one of her age switch controlled glycemic profile and A1c at point-of-care was 6.2%.     -  Glucose logs and insulin administration records pertaining to this visit,  to be scanned into patient's records.    - I have re-counseled the patient on diet management and weight loss  by adopting a carbohydrate restricted / protein rich  Diet.  - she  admits there is a room for improvement in her diet and drink choices. -  Suggestion is made  for her to avoid simple carbohydrates  from her diet including Cakes, Sweet Desserts / Pastries, Ice Cream, Soda (diet and regular), Sweet Tea, Candies, Chips, Cookies, Sweet Pastries,  Store Bought Juices, Alcohol in Excess of  1-2 drinks a day, Artificial Sweeteners, Coffee Creamer, and "Sugar-free" Products. This will help patient to have stable blood glucose profile and potentially avoid unintended weight gain.  - Patient is advised to stick to a routine mealtimes to eat 3 meals  a day and avoid unnecessary snacks ( to snack only to correct hypoglycemia).  - I have approached patient with the following individualized plan to manage diabetes and patient agrees.  -Her placement in group home is the best development for her lately.  -Her presenting glycemic profile is on target both fasting and postprandial.     -She will remain on Humulin RU 500 insulin for simplicity reasons.   -She is advised to decrease  Humulin U500  20 units  with breakfast, 20 units with lunch, and 20 units before supper for pre-meal blood glucose above 90 associated with monitoring of blood glucose before meals and at bedtime. - She is advised to skip insulin if pre-meal blood glucose is below 90 mg/dL or if she is not eating. -She is not a candidate for metformin, SGLT2 inhibitors due to CKD.   - Patient specific target  for A1c; LDL, HDL, Triglycerides, and  Waist Circumference were discussed in detail.  2) BP/HTN: Her blood pressure is controlled to target.  She is advised to restrict salt intake and I advised her to continue current medications including lisinopril 20 mg p.o. daily, hydralazine 25 mg p.o. 3 times daily, furosemide 40 mg p.o. Daily.  3) Lipids/HPL: Her recent lipid panel showed controlled  LDL of 80.  She is on fenofibrate's and advised to continue.   4)  Weight/Diet: CDE consult in progress, exercise, and carbohydrates information provided.  5) Chronic Care/Health Maintenance:  -Patient is  on ACEI  and encouraged to continue to follow up with Ophthalmology, Podiatrist at least yearly or according to recommendations, and advised to stay away  from smoking. I have recommended yearly flu vaccine and pneumonia vaccination at least every 5 years;  and  sleep for at least 7 hours a day.    - I advised patient to maintain close follow up with Rosita Fire, MD for primary care needs.  - Time spent with the patient: 25 min, of which >50% was spent in reviewing her blood glucose logs , discussing her hypoglycemia and hyperglycemia episodes, reviewing her current and  previous labs / studies and medications  doses and developing a plan to avoid hypoglycemia and hyperglycemia. Please refer to Patient Instructions for Blood Glucose Monitoring and Insulin/Medications Dosing Guide"  in media tab for additional information. Please  also refer to " Patient Self Inventory" in the Media  tab for reviewed elements of pertinent patient history.  [name] participated in the discussions, expressed understanding, and voiced agreement with the above plans.  All questions were answered to her satisfaction. she is encouraged to contact clinic should she have any questions or concerns prior to her return visit.   Follow up plan: -Return in about 4 months (around 05/31/2019) for Bring Meter and Logs- A1c in Office.  Glade Lloyd, MD Phone: [phone]  Fax: [phone]  This note was partially dictated with voice recognition software. Similar sounding words can be transcribed inadequately or may not  be corrected upon review.  01/31/2019, 4:43 PM

## 2019-02-01 DIAGNOSIS — I1310 Hypertensive heart and chronic kidney disease without heart failure, with stage 1 through stage 4 chronic kidney disease, or unspecified chronic kidney disease: Secondary | ICD-10-CM | POA: Diagnosis not present

## 2019-02-01 DIAGNOSIS — N183 Chronic kidney disease, stage 3 unspecified: Secondary | ICD-10-CM | POA: Diagnosis not present

## 2019-02-01 DIAGNOSIS — I5032 Chronic diastolic (congestive) heart failure: Secondary | ICD-10-CM | POA: Diagnosis not present

## 2019-02-01 DIAGNOSIS — I4821 Permanent atrial fibrillation: Secondary | ICD-10-CM | POA: Diagnosis not present

## 2019-02-01 DIAGNOSIS — J449 Chronic obstructive pulmonary disease, unspecified: Secondary | ICD-10-CM | POA: Diagnosis not present

## 2019-02-01 DIAGNOSIS — E1122 Type 2 diabetes mellitus with diabetic chronic kidney disease: Secondary | ICD-10-CM | POA: Diagnosis not present

## 2019-02-03 DIAGNOSIS — Z7901 Long term (current) use of anticoagulants: Secondary | ICD-10-CM | POA: Diagnosis not present

## 2019-02-03 DIAGNOSIS — E1122 Type 2 diabetes mellitus with diabetic chronic kidney disease: Secondary | ICD-10-CM | POA: Diagnosis not present

## 2019-02-03 DIAGNOSIS — F17210 Nicotine dependence, cigarettes, uncomplicated: Secondary | ICD-10-CM | POA: Diagnosis not present

## 2019-02-03 DIAGNOSIS — N183 Chronic kidney disease, stage 3 unspecified: Secondary | ICD-10-CM | POA: Diagnosis not present

## 2019-02-03 DIAGNOSIS — R2689 Other abnormalities of gait and mobility: Secondary | ICD-10-CM | POA: Diagnosis not present

## 2019-02-03 DIAGNOSIS — I4821 Permanent atrial fibrillation: Secondary | ICD-10-CM | POA: Diagnosis not present

## 2019-02-03 DIAGNOSIS — J449 Chronic obstructive pulmonary disease, unspecified: Secondary | ICD-10-CM | POA: Diagnosis not present

## 2019-02-03 DIAGNOSIS — I5032 Chronic diastolic (congestive) heart failure: Secondary | ICD-10-CM | POA: Diagnosis not present

## 2019-02-03 DIAGNOSIS — Z794 Long term (current) use of insulin: Secondary | ICD-10-CM | POA: Diagnosis not present

## 2019-02-03 DIAGNOSIS — Z6841 Body Mass Index (BMI) 40.0 and over, adult: Secondary | ICD-10-CM | POA: Diagnosis not present

## 2019-02-03 DIAGNOSIS — I1310 Hypertensive heart and chronic kidney disease without heart failure, with stage 1 through stage 4 chronic kidney disease, or unspecified chronic kidney disease: Secondary | ICD-10-CM | POA: Diagnosis not present

## 2019-02-07 DIAGNOSIS — N189 Chronic kidney disease, unspecified: Secondary | ICD-10-CM | POA: Diagnosis not present

## 2019-02-07 DIAGNOSIS — E1122 Type 2 diabetes mellitus with diabetic chronic kidney disease: Secondary | ICD-10-CM | POA: Diagnosis not present

## 2019-02-07 DIAGNOSIS — E559 Vitamin D deficiency, unspecified: Secondary | ICD-10-CM | POA: Diagnosis not present

## 2019-02-07 DIAGNOSIS — D631 Anemia in chronic kidney disease: Secondary | ICD-10-CM | POA: Diagnosis not present

## 2019-02-09 DIAGNOSIS — E559 Vitamin D deficiency, unspecified: Secondary | ICD-10-CM | POA: Diagnosis not present

## 2019-02-09 DIAGNOSIS — E876 Hypokalemia: Secondary | ICD-10-CM | POA: Diagnosis not present

## 2019-02-09 DIAGNOSIS — I5032 Chronic diastolic (congestive) heart failure: Secondary | ICD-10-CM | POA: Diagnosis not present

## 2019-02-09 DIAGNOSIS — N189 Chronic kidney disease, unspecified: Secondary | ICD-10-CM | POA: Diagnosis not present

## 2019-02-09 DIAGNOSIS — I10 Essential (primary) hypertension: Secondary | ICD-10-CM | POA: Diagnosis not present

## 2019-02-09 DIAGNOSIS — E211 Secondary hyperparathyroidism, not elsewhere classified: Secondary | ICD-10-CM | POA: Diagnosis not present

## 2019-02-09 DIAGNOSIS — N1832 Chronic kidney disease, stage 3b: Secondary | ICD-10-CM | POA: Diagnosis not present

## 2019-02-09 DIAGNOSIS — D631 Anemia in chronic kidney disease: Secondary | ICD-10-CM | POA: Diagnosis not present

## 2019-02-10 DIAGNOSIS — N1831 Chronic kidney disease, stage 3a: Secondary | ICD-10-CM | POA: Diagnosis not present

## 2019-02-10 DIAGNOSIS — I4821 Permanent atrial fibrillation: Secondary | ICD-10-CM | POA: Diagnosis not present

## 2019-02-10 DIAGNOSIS — I5032 Chronic diastolic (congestive) heart failure: Secondary | ICD-10-CM | POA: Diagnosis not present

## 2019-02-10 DIAGNOSIS — E1122 Type 2 diabetes mellitus with diabetic chronic kidney disease: Secondary | ICD-10-CM | POA: Diagnosis not present

## 2019-02-15 ENCOUNTER — Encounter (HOSPITAL_BASED_OUTPATIENT_CLINIC_OR_DEPARTMENT_OTHER): Payer: Medicare Other | Admitting: Physician Assistant

## 2019-02-28 DIAGNOSIS — B351 Tinea unguium: Secondary | ICD-10-CM | POA: Diagnosis not present

## 2019-02-28 DIAGNOSIS — M79674 Pain in right toe(s): Secondary | ICD-10-CM | POA: Diagnosis not present

## 2019-02-28 DIAGNOSIS — M79675 Pain in left toe(s): Secondary | ICD-10-CM | POA: Diagnosis not present

## 2019-03-02 DIAGNOSIS — U071 COVID-19: Secondary | ICD-10-CM | POA: Diagnosis not present

## 2019-03-02 DIAGNOSIS — I2510 Atherosclerotic heart disease of native coronary artery without angina pectoris: Secondary | ICD-10-CM | POA: Diagnosis not present

## 2019-03-02 DIAGNOSIS — I509 Heart failure, unspecified: Secondary | ICD-10-CM | POA: Diagnosis not present

## 2019-03-02 DIAGNOSIS — J449 Chronic obstructive pulmonary disease, unspecified: Secondary | ICD-10-CM | POA: Diagnosis not present

## 2019-03-02 DIAGNOSIS — Z20828 Contact with and (suspected) exposure to other viral communicable diseases: Secondary | ICD-10-CM | POA: Diagnosis not present

## 2019-03-05 DIAGNOSIS — Z794 Long term (current) use of insulin: Secondary | ICD-10-CM | POA: Diagnosis not present

## 2019-03-05 DIAGNOSIS — Z6841 Body Mass Index (BMI) 40.0 and over, adult: Secondary | ICD-10-CM | POA: Diagnosis not present

## 2019-03-05 DIAGNOSIS — R2689 Other abnormalities of gait and mobility: Secondary | ICD-10-CM | POA: Diagnosis not present

## 2019-03-05 DIAGNOSIS — J449 Chronic obstructive pulmonary disease, unspecified: Secondary | ICD-10-CM | POA: Diagnosis not present

## 2019-03-05 DIAGNOSIS — I1310 Hypertensive heart and chronic kidney disease without heart failure, with stage 1 through stage 4 chronic kidney disease, or unspecified chronic kidney disease: Secondary | ICD-10-CM | POA: Diagnosis not present

## 2019-03-05 DIAGNOSIS — Z7901 Long term (current) use of anticoagulants: Secondary | ICD-10-CM | POA: Diagnosis not present

## 2019-03-05 DIAGNOSIS — E1122 Type 2 diabetes mellitus with diabetic chronic kidney disease: Secondary | ICD-10-CM | POA: Diagnosis not present

## 2019-03-05 DIAGNOSIS — I5032 Chronic diastolic (congestive) heart failure: Secondary | ICD-10-CM | POA: Diagnosis not present

## 2019-03-05 DIAGNOSIS — F17210 Nicotine dependence, cigarettes, uncomplicated: Secondary | ICD-10-CM | POA: Diagnosis not present

## 2019-03-06 DIAGNOSIS — I5032 Chronic diastolic (congestive) heart failure: Secondary | ICD-10-CM | POA: Diagnosis not present

## 2019-03-06 DIAGNOSIS — R2689 Other abnormalities of gait and mobility: Secondary | ICD-10-CM | POA: Diagnosis not present

## 2019-03-06 DIAGNOSIS — U071 COVID-19: Secondary | ICD-10-CM | POA: Diagnosis not present

## 2019-03-06 DIAGNOSIS — Z20828 Contact with and (suspected) exposure to other viral communicable diseases: Secondary | ICD-10-CM | POA: Diagnosis not present

## 2019-03-06 DIAGNOSIS — J449 Chronic obstructive pulmonary disease, unspecified: Secondary | ICD-10-CM | POA: Diagnosis not present

## 2019-03-06 DIAGNOSIS — E1122 Type 2 diabetes mellitus with diabetic chronic kidney disease: Secondary | ICD-10-CM | POA: Diagnosis not present

## 2019-03-06 DIAGNOSIS — I1310 Hypertensive heart and chronic kidney disease without heart failure, with stage 1 through stage 4 chronic kidney disease, or unspecified chronic kidney disease: Secondary | ICD-10-CM | POA: Diagnosis not present

## 2019-03-08 DIAGNOSIS — I1310 Hypertensive heart and chronic kidney disease without heart failure, with stage 1 through stage 4 chronic kidney disease, or unspecified chronic kidney disease: Secondary | ICD-10-CM | POA: Diagnosis not present

## 2019-03-08 DIAGNOSIS — I5032 Chronic diastolic (congestive) heart failure: Secondary | ICD-10-CM | POA: Diagnosis not present

## 2019-03-08 DIAGNOSIS — E1122 Type 2 diabetes mellitus with diabetic chronic kidney disease: Secondary | ICD-10-CM | POA: Diagnosis not present

## 2019-03-08 DIAGNOSIS — R2689 Other abnormalities of gait and mobility: Secondary | ICD-10-CM | POA: Diagnosis not present

## 2019-03-08 DIAGNOSIS — J449 Chronic obstructive pulmonary disease, unspecified: Secondary | ICD-10-CM | POA: Diagnosis not present

## 2019-03-13 DIAGNOSIS — J449 Chronic obstructive pulmonary disease, unspecified: Secondary | ICD-10-CM | POA: Diagnosis not present

## 2019-03-13 DIAGNOSIS — E1122 Type 2 diabetes mellitus with diabetic chronic kidney disease: Secondary | ICD-10-CM | POA: Diagnosis not present

## 2019-03-13 DIAGNOSIS — R2689 Other abnormalities of gait and mobility: Secondary | ICD-10-CM | POA: Diagnosis not present

## 2019-03-13 DIAGNOSIS — I1310 Hypertensive heart and chronic kidney disease without heart failure, with stage 1 through stage 4 chronic kidney disease, or unspecified chronic kidney disease: Secondary | ICD-10-CM | POA: Diagnosis not present

## 2019-03-13 DIAGNOSIS — I5032 Chronic diastolic (congestive) heart failure: Secondary | ICD-10-CM | POA: Diagnosis not present

## 2019-03-15 DIAGNOSIS — E1122 Type 2 diabetes mellitus with diabetic chronic kidney disease: Secondary | ICD-10-CM | POA: Diagnosis not present

## 2019-03-15 DIAGNOSIS — J449 Chronic obstructive pulmonary disease, unspecified: Secondary | ICD-10-CM | POA: Diagnosis not present

## 2019-03-15 DIAGNOSIS — I5032 Chronic diastolic (congestive) heart failure: Secondary | ICD-10-CM | POA: Diagnosis not present

## 2019-03-15 DIAGNOSIS — I1310 Hypertensive heart and chronic kidney disease without heart failure, with stage 1 through stage 4 chronic kidney disease, or unspecified chronic kidney disease: Secondary | ICD-10-CM | POA: Diagnosis not present

## 2019-03-15 DIAGNOSIS — R2689 Other abnormalities of gait and mobility: Secondary | ICD-10-CM | POA: Diagnosis not present

## 2019-03-20 DIAGNOSIS — R2689 Other abnormalities of gait and mobility: Secondary | ICD-10-CM | POA: Diagnosis not present

## 2019-03-20 DIAGNOSIS — J449 Chronic obstructive pulmonary disease, unspecified: Secondary | ICD-10-CM | POA: Diagnosis not present

## 2019-03-20 DIAGNOSIS — I5032 Chronic diastolic (congestive) heart failure: Secondary | ICD-10-CM | POA: Diagnosis not present

## 2019-03-20 DIAGNOSIS — I1310 Hypertensive heart and chronic kidney disease without heart failure, with stage 1 through stage 4 chronic kidney disease, or unspecified chronic kidney disease: Secondary | ICD-10-CM | POA: Diagnosis not present

## 2019-03-20 DIAGNOSIS — E1122 Type 2 diabetes mellitus with diabetic chronic kidney disease: Secondary | ICD-10-CM | POA: Diagnosis not present

## 2019-03-21 DIAGNOSIS — E1122 Type 2 diabetes mellitus with diabetic chronic kidney disease: Secondary | ICD-10-CM | POA: Diagnosis not present

## 2019-03-21 DIAGNOSIS — J449 Chronic obstructive pulmonary disease, unspecified: Secondary | ICD-10-CM | POA: Diagnosis not present

## 2019-03-21 DIAGNOSIS — I1310 Hypertensive heart and chronic kidney disease without heart failure, with stage 1 through stage 4 chronic kidney disease, or unspecified chronic kidney disease: Secondary | ICD-10-CM | POA: Diagnosis not present

## 2019-03-21 DIAGNOSIS — R2689 Other abnormalities of gait and mobility: Secondary | ICD-10-CM | POA: Diagnosis not present

## 2019-03-21 DIAGNOSIS — I5032 Chronic diastolic (congestive) heart failure: Secondary | ICD-10-CM | POA: Diagnosis not present

## 2019-03-27 DIAGNOSIS — R2689 Other abnormalities of gait and mobility: Secondary | ICD-10-CM | POA: Diagnosis not present

## 2019-03-27 DIAGNOSIS — E1122 Type 2 diabetes mellitus with diabetic chronic kidney disease: Secondary | ICD-10-CM | POA: Diagnosis not present

## 2019-03-27 DIAGNOSIS — I5032 Chronic diastolic (congestive) heart failure: Secondary | ICD-10-CM | POA: Diagnosis not present

## 2019-03-27 DIAGNOSIS — I1310 Hypertensive heart and chronic kidney disease without heart failure, with stage 1 through stage 4 chronic kidney disease, or unspecified chronic kidney disease: Secondary | ICD-10-CM | POA: Diagnosis not present

## 2019-03-27 DIAGNOSIS — J449 Chronic obstructive pulmonary disease, unspecified: Secondary | ICD-10-CM | POA: Diagnosis not present

## 2019-03-29 DIAGNOSIS — I5032 Chronic diastolic (congestive) heart failure: Secondary | ICD-10-CM | POA: Diagnosis not present

## 2019-03-29 DIAGNOSIS — E1122 Type 2 diabetes mellitus with diabetic chronic kidney disease: Secondary | ICD-10-CM | POA: Diagnosis not present

## 2019-03-29 DIAGNOSIS — R2689 Other abnormalities of gait and mobility: Secondary | ICD-10-CM | POA: Diagnosis not present

## 2019-03-29 DIAGNOSIS — I1310 Hypertensive heart and chronic kidney disease without heart failure, with stage 1 through stage 4 chronic kidney disease, or unspecified chronic kidney disease: Secondary | ICD-10-CM | POA: Diagnosis not present

## 2019-03-29 DIAGNOSIS — J449 Chronic obstructive pulmonary disease, unspecified: Secondary | ICD-10-CM | POA: Diagnosis not present

## 2019-04-03 DIAGNOSIS — I5032 Chronic diastolic (congestive) heart failure: Secondary | ICD-10-CM | POA: Diagnosis not present

## 2019-04-03 DIAGNOSIS — R2689 Other abnormalities of gait and mobility: Secondary | ICD-10-CM | POA: Diagnosis not present

## 2019-04-03 DIAGNOSIS — J449 Chronic obstructive pulmonary disease, unspecified: Secondary | ICD-10-CM | POA: Diagnosis not present

## 2019-04-03 DIAGNOSIS — I1310 Hypertensive heart and chronic kidney disease without heart failure, with stage 1 through stage 4 chronic kidney disease, or unspecified chronic kidney disease: Secondary | ICD-10-CM | POA: Diagnosis not present

## 2019-04-03 DIAGNOSIS — U071 COVID-19: Secondary | ICD-10-CM | POA: Diagnosis not present

## 2019-04-03 DIAGNOSIS — Z20828 Contact with and (suspected) exposure to other viral communicable diseases: Secondary | ICD-10-CM | POA: Diagnosis not present

## 2019-04-03 DIAGNOSIS — E1122 Type 2 diabetes mellitus with diabetic chronic kidney disease: Secondary | ICD-10-CM | POA: Diagnosis not present

## 2019-04-04 DIAGNOSIS — F17210 Nicotine dependence, cigarettes, uncomplicated: Secondary | ICD-10-CM | POA: Diagnosis not present

## 2019-04-04 DIAGNOSIS — J449 Chronic obstructive pulmonary disease, unspecified: Secondary | ICD-10-CM | POA: Diagnosis not present

## 2019-04-04 DIAGNOSIS — Z7901 Long term (current) use of anticoagulants: Secondary | ICD-10-CM | POA: Diagnosis not present

## 2019-04-04 DIAGNOSIS — Z6841 Body Mass Index (BMI) 40.0 and over, adult: Secondary | ICD-10-CM | POA: Diagnosis not present

## 2019-04-04 DIAGNOSIS — E1122 Type 2 diabetes mellitus with diabetic chronic kidney disease: Secondary | ICD-10-CM | POA: Diagnosis not present

## 2019-04-04 DIAGNOSIS — I1310 Hypertensive heart and chronic kidney disease without heart failure, with stage 1 through stage 4 chronic kidney disease, or unspecified chronic kidney disease: Secondary | ICD-10-CM | POA: Diagnosis not present

## 2019-04-04 DIAGNOSIS — Z794 Long term (current) use of insulin: Secondary | ICD-10-CM | POA: Diagnosis not present

## 2019-04-04 DIAGNOSIS — I5032 Chronic diastolic (congestive) heart failure: Secondary | ICD-10-CM | POA: Diagnosis not present

## 2019-04-04 DIAGNOSIS — R2689 Other abnormalities of gait and mobility: Secondary | ICD-10-CM | POA: Diagnosis not present

## 2019-04-05 DIAGNOSIS — I1310 Hypertensive heart and chronic kidney disease without heart failure, with stage 1 through stage 4 chronic kidney disease, or unspecified chronic kidney disease: Secondary | ICD-10-CM | POA: Diagnosis not present

## 2019-04-05 DIAGNOSIS — J449 Chronic obstructive pulmonary disease, unspecified: Secondary | ICD-10-CM | POA: Diagnosis not present

## 2019-04-05 DIAGNOSIS — E1122 Type 2 diabetes mellitus with diabetic chronic kidney disease: Secondary | ICD-10-CM | POA: Diagnosis not present

## 2019-04-05 DIAGNOSIS — I5032 Chronic diastolic (congestive) heart failure: Secondary | ICD-10-CM | POA: Diagnosis not present

## 2019-04-05 DIAGNOSIS — R2689 Other abnormalities of gait and mobility: Secondary | ICD-10-CM | POA: Diagnosis not present

## 2019-04-07 ENCOUNTER — Other Ambulatory Visit: Payer: Self-pay | Admitting: "Endocrinology

## 2019-04-10 DIAGNOSIS — R2689 Other abnormalities of gait and mobility: Secondary | ICD-10-CM | POA: Diagnosis not present

## 2019-04-10 DIAGNOSIS — I5032 Chronic diastolic (congestive) heart failure: Secondary | ICD-10-CM | POA: Diagnosis not present

## 2019-04-10 DIAGNOSIS — J449 Chronic obstructive pulmonary disease, unspecified: Secondary | ICD-10-CM | POA: Diagnosis not present

## 2019-04-10 DIAGNOSIS — E1122 Type 2 diabetes mellitus with diabetic chronic kidney disease: Secondary | ICD-10-CM | POA: Diagnosis not present

## 2019-04-10 DIAGNOSIS — I1310 Hypertensive heart and chronic kidney disease without heart failure, with stage 1 through stage 4 chronic kidney disease, or unspecified chronic kidney disease: Secondary | ICD-10-CM | POA: Diagnosis not present

## 2019-04-10 DIAGNOSIS — Z20828 Contact with and (suspected) exposure to other viral communicable diseases: Secondary | ICD-10-CM | POA: Diagnosis not present

## 2019-04-10 DIAGNOSIS — U071 COVID-19: Secondary | ICD-10-CM | POA: Diagnosis not present

## 2019-04-13 DIAGNOSIS — I5032 Chronic diastolic (congestive) heart failure: Secondary | ICD-10-CM | POA: Diagnosis not present

## 2019-04-13 DIAGNOSIS — J449 Chronic obstructive pulmonary disease, unspecified: Secondary | ICD-10-CM | POA: Diagnosis not present

## 2019-04-13 DIAGNOSIS — E1122 Type 2 diabetes mellitus with diabetic chronic kidney disease: Secondary | ICD-10-CM | POA: Diagnosis not present

## 2019-04-13 DIAGNOSIS — I1310 Hypertensive heart and chronic kidney disease without heart failure, with stage 1 through stage 4 chronic kidney disease, or unspecified chronic kidney disease: Secondary | ICD-10-CM | POA: Diagnosis not present

## 2019-04-13 DIAGNOSIS — R2689 Other abnormalities of gait and mobility: Secondary | ICD-10-CM | POA: Diagnosis not present

## 2019-04-17 DIAGNOSIS — E1122 Type 2 diabetes mellitus with diabetic chronic kidney disease: Secondary | ICD-10-CM | POA: Diagnosis not present

## 2019-04-17 DIAGNOSIS — I1310 Hypertensive heart and chronic kidney disease without heart failure, with stage 1 through stage 4 chronic kidney disease, or unspecified chronic kidney disease: Secondary | ICD-10-CM | POA: Diagnosis not present

## 2019-04-17 DIAGNOSIS — J449 Chronic obstructive pulmonary disease, unspecified: Secondary | ICD-10-CM | POA: Diagnosis not present

## 2019-04-17 DIAGNOSIS — I5032 Chronic diastolic (congestive) heart failure: Secondary | ICD-10-CM | POA: Diagnosis not present

## 2019-04-17 DIAGNOSIS — R2689 Other abnormalities of gait and mobility: Secondary | ICD-10-CM | POA: Diagnosis not present

## 2019-04-17 DIAGNOSIS — U071 COVID-19: Secondary | ICD-10-CM | POA: Diagnosis not present

## 2019-04-17 DIAGNOSIS — Z20828 Contact with and (suspected) exposure to other viral communicable diseases: Secondary | ICD-10-CM | POA: Diagnosis not present

## 2019-04-18 DIAGNOSIS — E1122 Type 2 diabetes mellitus with diabetic chronic kidney disease: Secondary | ICD-10-CM | POA: Diagnosis not present

## 2019-04-18 DIAGNOSIS — J449 Chronic obstructive pulmonary disease, unspecified: Secondary | ICD-10-CM | POA: Diagnosis not present

## 2019-04-18 DIAGNOSIS — N1831 Chronic kidney disease, stage 3a: Secondary | ICD-10-CM | POA: Diagnosis not present

## 2019-04-18 DIAGNOSIS — I5032 Chronic diastolic (congestive) heart failure: Secondary | ICD-10-CM | POA: Diagnosis not present

## 2019-04-19 DIAGNOSIS — I5032 Chronic diastolic (congestive) heart failure: Secondary | ICD-10-CM | POA: Diagnosis not present

## 2019-04-19 DIAGNOSIS — E119 Type 2 diabetes mellitus without complications: Secondary | ICD-10-CM | POA: Diagnosis not present

## 2019-04-19 DIAGNOSIS — E1122 Type 2 diabetes mellitus with diabetic chronic kidney disease: Secondary | ICD-10-CM | POA: Diagnosis not present

## 2019-04-19 DIAGNOSIS — H25813 Combined forms of age-related cataract, bilateral: Secondary | ICD-10-CM | POA: Diagnosis not present

## 2019-04-19 DIAGNOSIS — N1831 Chronic kidney disease, stage 3a: Secondary | ICD-10-CM | POA: Diagnosis not present

## 2019-04-19 DIAGNOSIS — Z794 Long term (current) use of insulin: Secondary | ICD-10-CM | POA: Diagnosis not present

## 2019-04-19 DIAGNOSIS — Z7984 Long term (current) use of oral hypoglycemic drugs: Secondary | ICD-10-CM | POA: Diagnosis not present

## 2019-04-20 DIAGNOSIS — I1310 Hypertensive heart and chronic kidney disease without heart failure, with stage 1 through stage 4 chronic kidney disease, or unspecified chronic kidney disease: Secondary | ICD-10-CM | POA: Diagnosis not present

## 2019-04-20 DIAGNOSIS — I5032 Chronic diastolic (congestive) heart failure: Secondary | ICD-10-CM | POA: Diagnosis not present

## 2019-04-20 DIAGNOSIS — J449 Chronic obstructive pulmonary disease, unspecified: Secondary | ICD-10-CM | POA: Diagnosis not present

## 2019-04-20 DIAGNOSIS — E1122 Type 2 diabetes mellitus with diabetic chronic kidney disease: Secondary | ICD-10-CM | POA: Diagnosis not present

## 2019-04-20 DIAGNOSIS — R2689 Other abnormalities of gait and mobility: Secondary | ICD-10-CM | POA: Diagnosis not present

## 2019-04-21 DIAGNOSIS — E1165 Type 2 diabetes mellitus with hyperglycemia: Secondary | ICD-10-CM | POA: Diagnosis not present

## 2019-04-21 DIAGNOSIS — L03114 Cellulitis of left upper limb: Secondary | ICD-10-CM | POA: Diagnosis not present

## 2019-04-21 DIAGNOSIS — S81802A Unspecified open wound, left lower leg, initial encounter: Secondary | ICD-10-CM | POA: Diagnosis not present

## 2019-04-24 DIAGNOSIS — J449 Chronic obstructive pulmonary disease, unspecified: Secondary | ICD-10-CM | POA: Diagnosis not present

## 2019-04-24 DIAGNOSIS — U071 COVID-19: Secondary | ICD-10-CM | POA: Diagnosis not present

## 2019-04-24 DIAGNOSIS — E1122 Type 2 diabetes mellitus with diabetic chronic kidney disease: Secondary | ICD-10-CM | POA: Diagnosis not present

## 2019-04-24 DIAGNOSIS — I5032 Chronic diastolic (congestive) heart failure: Secondary | ICD-10-CM | POA: Diagnosis not present

## 2019-04-24 DIAGNOSIS — I1310 Hypertensive heart and chronic kidney disease without heart failure, with stage 1 through stage 4 chronic kidney disease, or unspecified chronic kidney disease: Secondary | ICD-10-CM | POA: Diagnosis not present

## 2019-04-24 DIAGNOSIS — Z20828 Contact with and (suspected) exposure to other viral communicable diseases: Secondary | ICD-10-CM | POA: Diagnosis not present

## 2019-04-24 DIAGNOSIS — R2689 Other abnormalities of gait and mobility: Secondary | ICD-10-CM | POA: Diagnosis not present

## 2019-04-25 DIAGNOSIS — R2689 Other abnormalities of gait and mobility: Secondary | ICD-10-CM | POA: Diagnosis not present

## 2019-04-25 DIAGNOSIS — J449 Chronic obstructive pulmonary disease, unspecified: Secondary | ICD-10-CM | POA: Diagnosis not present

## 2019-04-25 DIAGNOSIS — E1122 Type 2 diabetes mellitus with diabetic chronic kidney disease: Secondary | ICD-10-CM | POA: Diagnosis not present

## 2019-04-25 DIAGNOSIS — I1310 Hypertensive heart and chronic kidney disease without heart failure, with stage 1 through stage 4 chronic kidney disease, or unspecified chronic kidney disease: Secondary | ICD-10-CM | POA: Diagnosis not present

## 2019-04-25 DIAGNOSIS — I5032 Chronic diastolic (congestive) heart failure: Secondary | ICD-10-CM | POA: Diagnosis not present

## 2019-04-27 DIAGNOSIS — J449 Chronic obstructive pulmonary disease, unspecified: Secondary | ICD-10-CM | POA: Diagnosis not present

## 2019-04-27 DIAGNOSIS — R2689 Other abnormalities of gait and mobility: Secondary | ICD-10-CM | POA: Diagnosis not present

## 2019-04-27 DIAGNOSIS — I1310 Hypertensive heart and chronic kidney disease without heart failure, with stage 1 through stage 4 chronic kidney disease, or unspecified chronic kidney disease: Secondary | ICD-10-CM | POA: Diagnosis not present

## 2019-04-27 DIAGNOSIS — I5032 Chronic diastolic (congestive) heart failure: Secondary | ICD-10-CM | POA: Diagnosis not present

## 2019-04-27 DIAGNOSIS — E1122 Type 2 diabetes mellitus with diabetic chronic kidney disease: Secondary | ICD-10-CM | POA: Diagnosis not present

## 2019-05-01 DIAGNOSIS — R2689 Other abnormalities of gait and mobility: Secondary | ICD-10-CM | POA: Diagnosis not present

## 2019-05-01 DIAGNOSIS — Z20828 Contact with and (suspected) exposure to other viral communicable diseases: Secondary | ICD-10-CM | POA: Diagnosis not present

## 2019-05-01 DIAGNOSIS — U071 COVID-19: Secondary | ICD-10-CM | POA: Diagnosis not present

## 2019-05-01 DIAGNOSIS — J449 Chronic obstructive pulmonary disease, unspecified: Secondary | ICD-10-CM | POA: Diagnosis not present

## 2019-05-01 DIAGNOSIS — Z23 Encounter for immunization: Secondary | ICD-10-CM | POA: Diagnosis not present

## 2019-05-01 DIAGNOSIS — I1310 Hypertensive heart and chronic kidney disease without heart failure, with stage 1 through stage 4 chronic kidney disease, or unspecified chronic kidney disease: Secondary | ICD-10-CM | POA: Diagnosis not present

## 2019-05-01 DIAGNOSIS — E1122 Type 2 diabetes mellitus with diabetic chronic kidney disease: Secondary | ICD-10-CM | POA: Diagnosis not present

## 2019-05-01 DIAGNOSIS — I5032 Chronic diastolic (congestive) heart failure: Secondary | ICD-10-CM | POA: Diagnosis not present

## 2019-05-04 DIAGNOSIS — J449 Chronic obstructive pulmonary disease, unspecified: Secondary | ICD-10-CM | POA: Diagnosis not present

## 2019-05-04 DIAGNOSIS — E1122 Type 2 diabetes mellitus with diabetic chronic kidney disease: Secondary | ICD-10-CM | POA: Diagnosis not present

## 2019-05-04 DIAGNOSIS — F17210 Nicotine dependence, cigarettes, uncomplicated: Secondary | ICD-10-CM | POA: Diagnosis not present

## 2019-05-04 DIAGNOSIS — Z7901 Long term (current) use of anticoagulants: Secondary | ICD-10-CM | POA: Diagnosis not present

## 2019-05-04 DIAGNOSIS — Z794 Long term (current) use of insulin: Secondary | ICD-10-CM | POA: Diagnosis not present

## 2019-05-04 DIAGNOSIS — I5032 Chronic diastolic (congestive) heart failure: Secondary | ICD-10-CM | POA: Diagnosis not present

## 2019-05-04 DIAGNOSIS — I1310 Hypertensive heart and chronic kidney disease without heart failure, with stage 1 through stage 4 chronic kidney disease, or unspecified chronic kidney disease: Secondary | ICD-10-CM | POA: Diagnosis not present

## 2019-05-04 DIAGNOSIS — S81802A Unspecified open wound, left lower leg, initial encounter: Secondary | ICD-10-CM | POA: Diagnosis not present

## 2019-05-04 DIAGNOSIS — R2689 Other abnormalities of gait and mobility: Secondary | ICD-10-CM | POA: Diagnosis not present

## 2019-05-04 DIAGNOSIS — Z6841 Body Mass Index (BMI) 40.0 and over, adult: Secondary | ICD-10-CM | POA: Diagnosis not present

## 2019-05-08 DIAGNOSIS — Z20828 Contact with and (suspected) exposure to other viral communicable diseases: Secondary | ICD-10-CM | POA: Diagnosis not present

## 2019-05-08 DIAGNOSIS — U071 COVID-19: Secondary | ICD-10-CM | POA: Diagnosis not present

## 2019-05-09 DIAGNOSIS — I5032 Chronic diastolic (congestive) heart failure: Secondary | ICD-10-CM | POA: Diagnosis not present

## 2019-05-09 DIAGNOSIS — E1122 Type 2 diabetes mellitus with diabetic chronic kidney disease: Secondary | ICD-10-CM | POA: Diagnosis not present

## 2019-05-09 DIAGNOSIS — J449 Chronic obstructive pulmonary disease, unspecified: Secondary | ICD-10-CM | POA: Diagnosis not present

## 2019-05-09 DIAGNOSIS — I1310 Hypertensive heart and chronic kidney disease without heart failure, with stage 1 through stage 4 chronic kidney disease, or unspecified chronic kidney disease: Secondary | ICD-10-CM | POA: Diagnosis not present

## 2019-05-09 DIAGNOSIS — R2689 Other abnormalities of gait and mobility: Secondary | ICD-10-CM | POA: Diagnosis not present

## 2019-05-14 DIAGNOSIS — I1310 Hypertensive heart and chronic kidney disease without heart failure, with stage 1 through stage 4 chronic kidney disease, or unspecified chronic kidney disease: Secondary | ICD-10-CM | POA: Diagnosis not present

## 2019-05-14 DIAGNOSIS — J449 Chronic obstructive pulmonary disease, unspecified: Secondary | ICD-10-CM | POA: Diagnosis not present

## 2019-05-14 DIAGNOSIS — E1122 Type 2 diabetes mellitus with diabetic chronic kidney disease: Secondary | ICD-10-CM | POA: Diagnosis not present

## 2019-05-14 DIAGNOSIS — I5032 Chronic diastolic (congestive) heart failure: Secondary | ICD-10-CM | POA: Diagnosis not present

## 2019-05-14 DIAGNOSIS — R2689 Other abnormalities of gait and mobility: Secondary | ICD-10-CM | POA: Diagnosis not present

## 2019-05-15 DIAGNOSIS — U071 COVID-19: Secondary | ICD-10-CM | POA: Diagnosis not present

## 2019-05-15 DIAGNOSIS — Z20828 Contact with and (suspected) exposure to other viral communicable diseases: Secondary | ICD-10-CM | POA: Diagnosis not present

## 2019-05-16 DIAGNOSIS — U071 COVID-19: Secondary | ICD-10-CM | POA: Diagnosis not present

## 2019-05-17 DIAGNOSIS — M79674 Pain in right toe(s): Secondary | ICD-10-CM | POA: Diagnosis not present

## 2019-05-17 DIAGNOSIS — B351 Tinea unguium: Secondary | ICD-10-CM | POA: Diagnosis not present

## 2019-05-17 DIAGNOSIS — M79675 Pain in left toe(s): Secondary | ICD-10-CM | POA: Diagnosis not present

## 2019-05-22 DIAGNOSIS — U071 COVID-19: Secondary | ICD-10-CM | POA: Diagnosis not present

## 2019-05-22 DIAGNOSIS — R2689 Other abnormalities of gait and mobility: Secondary | ICD-10-CM | POA: Diagnosis not present

## 2019-05-22 DIAGNOSIS — Z20828 Contact with and (suspected) exposure to other viral communicable diseases: Secondary | ICD-10-CM | POA: Diagnosis not present

## 2019-05-22 DIAGNOSIS — I5032 Chronic diastolic (congestive) heart failure: Secondary | ICD-10-CM | POA: Diagnosis not present

## 2019-05-22 DIAGNOSIS — I1310 Hypertensive heart and chronic kidney disease without heart failure, with stage 1 through stage 4 chronic kidney disease, or unspecified chronic kidney disease: Secondary | ICD-10-CM | POA: Diagnosis not present

## 2019-05-22 DIAGNOSIS — J449 Chronic obstructive pulmonary disease, unspecified: Secondary | ICD-10-CM | POA: Diagnosis not present

## 2019-05-22 DIAGNOSIS — E1122 Type 2 diabetes mellitus with diabetic chronic kidney disease: Secondary | ICD-10-CM | POA: Diagnosis not present

## 2019-05-29 DIAGNOSIS — Z23 Encounter for immunization: Secondary | ICD-10-CM | POA: Diagnosis not present

## 2019-05-29 DIAGNOSIS — R2689 Other abnormalities of gait and mobility: Secondary | ICD-10-CM | POA: Diagnosis not present

## 2019-05-29 DIAGNOSIS — I5032 Chronic diastolic (congestive) heart failure: Secondary | ICD-10-CM | POA: Diagnosis not present

## 2019-05-29 DIAGNOSIS — J449 Chronic obstructive pulmonary disease, unspecified: Secondary | ICD-10-CM | POA: Diagnosis not present

## 2019-05-29 DIAGNOSIS — Z20828 Contact with and (suspected) exposure to other viral communicable diseases: Secondary | ICD-10-CM | POA: Diagnosis not present

## 2019-05-29 DIAGNOSIS — U071 COVID-19: Secondary | ICD-10-CM | POA: Diagnosis not present

## 2019-05-29 DIAGNOSIS — E1122 Type 2 diabetes mellitus with diabetic chronic kidney disease: Secondary | ICD-10-CM | POA: Diagnosis not present

## 2019-05-29 DIAGNOSIS — I1310 Hypertensive heart and chronic kidney disease without heart failure, with stage 1 through stage 4 chronic kidney disease, or unspecified chronic kidney disease: Secondary | ICD-10-CM | POA: Diagnosis not present

## 2019-06-01 DIAGNOSIS — R296 Repeated falls: Secondary | ICD-10-CM | POA: Diagnosis not present

## 2019-06-01 DIAGNOSIS — M6281 Muscle weakness (generalized): Secondary | ICD-10-CM | POA: Diagnosis not present

## 2019-06-01 DIAGNOSIS — J449 Chronic obstructive pulmonary disease, unspecified: Secondary | ICD-10-CM | POA: Diagnosis not present

## 2019-06-05 DIAGNOSIS — Z20828 Contact with and (suspected) exposure to other viral communicable diseases: Secondary | ICD-10-CM | POA: Diagnosis not present

## 2019-06-05 DIAGNOSIS — J449 Chronic obstructive pulmonary disease, unspecified: Secondary | ICD-10-CM | POA: Diagnosis not present

## 2019-06-05 DIAGNOSIS — U071 COVID-19: Secondary | ICD-10-CM | POA: Diagnosis not present

## 2019-06-05 DIAGNOSIS — R296 Repeated falls: Secondary | ICD-10-CM | POA: Diagnosis not present

## 2019-06-05 DIAGNOSIS — M6281 Muscle weakness (generalized): Secondary | ICD-10-CM | POA: Diagnosis not present

## 2019-06-06 ENCOUNTER — Ambulatory Visit: Payer: Medicaid Other | Admitting: "Endocrinology

## 2019-06-07 DIAGNOSIS — R296 Repeated falls: Secondary | ICD-10-CM | POA: Diagnosis not present

## 2019-06-07 DIAGNOSIS — M6281 Muscle weakness (generalized): Secondary | ICD-10-CM | POA: Diagnosis not present

## 2019-06-07 DIAGNOSIS — J449 Chronic obstructive pulmonary disease, unspecified: Secondary | ICD-10-CM | POA: Diagnosis not present

## 2019-06-12 DIAGNOSIS — R296 Repeated falls: Secondary | ICD-10-CM | POA: Diagnosis not present

## 2019-06-12 DIAGNOSIS — M6281 Muscle weakness (generalized): Secondary | ICD-10-CM | POA: Diagnosis not present

## 2019-06-12 DIAGNOSIS — Z20828 Contact with and (suspected) exposure to other viral communicable diseases: Secondary | ICD-10-CM | POA: Diagnosis not present

## 2019-06-12 DIAGNOSIS — J449 Chronic obstructive pulmonary disease, unspecified: Secondary | ICD-10-CM | POA: Diagnosis not present

## 2019-06-12 DIAGNOSIS — U071 COVID-19: Secondary | ICD-10-CM | POA: Diagnosis not present

## 2019-06-14 DIAGNOSIS — R296 Repeated falls: Secondary | ICD-10-CM | POA: Diagnosis not present

## 2019-06-14 DIAGNOSIS — M6281 Muscle weakness (generalized): Secondary | ICD-10-CM | POA: Diagnosis not present

## 2019-06-14 DIAGNOSIS — J449 Chronic obstructive pulmonary disease, unspecified: Secondary | ICD-10-CM | POA: Diagnosis not present

## 2019-06-15 ENCOUNTER — Other Ambulatory Visit: Payer: Self-pay

## 2019-06-15 ENCOUNTER — Ambulatory Visit (INDEPENDENT_AMBULATORY_CARE_PROVIDER_SITE_OTHER): Payer: Medicare Other | Admitting: "Endocrinology

## 2019-06-15 ENCOUNTER — Encounter: Payer: Self-pay | Admitting: "Endocrinology

## 2019-06-15 VITALS — BP 155/74 | HR 45 | Ht 61.0 in | Wt 231.6 lb

## 2019-06-15 DIAGNOSIS — Z794 Long term (current) use of insulin: Secondary | ICD-10-CM

## 2019-06-15 DIAGNOSIS — N184 Chronic kidney disease, stage 4 (severe): Secondary | ICD-10-CM | POA: Diagnosis not present

## 2019-06-15 DIAGNOSIS — E1122 Type 2 diabetes mellitus with diabetic chronic kidney disease: Secondary | ICD-10-CM

## 2019-06-15 DIAGNOSIS — E782 Mixed hyperlipidemia: Secondary | ICD-10-CM

## 2019-06-15 DIAGNOSIS — I10 Essential (primary) hypertension: Secondary | ICD-10-CM

## 2019-06-15 LAB — POCT GLYCOSYLATED HEMOGLOBIN (HGB A1C): Hemoglobin A1C: 6.4 % — AB (ref 4.0–5.6)

## 2019-06-15 NOTE — Progress Notes (Signed)
06/15/2019  Endocrinology follow-up note   Subjective:    Patient ID: [name], female    DOB: 27-Mar-1956, PCP Rosita Fire, MD   Past Medical History:  Diagnosis Date  . Arthritis   . Asthma   . Atrial fibrillation ([place])   . Chronic diastolic CHF (congestive heart failure) ([place])   . CKD (chronic kidney disease), stage III   . Diabetes mellitus type 2 in obese ([place])   . Hypertension   . Iron deficiency anemia 10/16/2010  . Mental handicap 10/16/2010  . Mild CAD 2013  . Morbid obesity ([place])   . OSA (obstructive sleep apnea)    Past Surgical History:  Procedure Laterality Date  . ABDOMINAL HYSTERECTOMY    . AV FISTULA PLACEMENT Right 06/06/2018   Procedure: ARTERIOVENOUS (AV) FISTULA CREATION;  Surgeon: Marty Heck, MD;  Location: [place];  Service: Vascular;  Laterality: Right;  . CESAREAN SECTION    . CHOLECYSTECTOMY    . COLONOSCOPY  08/2010   normal TI, sigmoid polyp (adenoma ). Next TCS due  08/2015,  . ESOPHAGOGASTRODUODENOSCOPY  08/2010   antral and duodenal erosions s/p bx (chronic gastritis, no h.pylori, no celiac dz ), hiatal hernia  . FISTULA SUPERFICIALIZATION Right 08/25/2018   Procedure: FISTULA SUPERFICIALIZATION RIGHT ARM;  Surgeon: Waynetta Sandy, MD;  Location: [place];  Service: Cardiovascular;  Laterality: Right;  . IR FLUORO GUIDE CV LINE RIGHT  05/31/2018  . IR US GUIDE VASC ACCESS RIGHT  05/31/2018  . KNEE SURGERY     right knee @ 64 years of age  . LEFT AND RIGHT HEART CATHETERIZATION WITH CORONARY ANGIOGRAM N/A 09/21/2011   Procedure: LEFT AND RIGHT HEART CATHETERIZATION WITH CORONARY ANGIOGRAM;  Surgeon: Birdie Riddle, MD;  Location: [facility];  Service: Cardiovascular;  Laterality: N/A;   Social History   Socioeconomic History  . Marital status: Divorced    Spouse name: Not on file  . Number of children: 1  . Years of education: Not on file  . Highest education level: Not on file  Occupational History    Employer:  UNEMPLOYED  Tobacco Use  . Smoking status: Former Smoker    Packs/day: 1.00    Years: 0.00    Pack years: 0.00    Types: Cigarettes    Quit date: 04/11/1996    Years since quitting: 23.1  . Smokeless tobacco: Never Used  . Tobacco comment: quit a couple year ago  Substance and Sexual Activity  . Alcohol use: No  . Drug use: No  . Sexual activity: Never  Other Topics Concern  . Not on file  Social History Narrative   Lives with parents.    Social Determinants of Health   Financial Resource Strain:   . Difficulty of Paying Living Expenses:   Food Insecurity:   . Worried About Charity fundraiser in the Last Year:   . Arboriculturist in the Last Year:   Transportation Needs:   . Film/video editor (Medical):   Marland Kitchen Lack of Transportation (Non-Medical):   Physical Activity:   . Days of Exercise per Week:   . Minutes of Exercise per Session:   Stress:   . Feeling of Stress :   Social Connections:   . Frequency of Communication with Friends and Family:   . Frequency of Social Gatherings with Friends and Family:   . Attends Religious Services:   . Active Member of Clubs or Organizations:   . Attends Club  or Organization Meetings:   Marland Kitchen Marital Status:    Outpatient Encounter Medications as of 06/15/2019  Medication Sig  . ACCU-CHEK AVIVA PLUS test strip USE TO TEST THREE TIMES DAILY.  Marland Kitchen ACCU-CHEK SOFTCLIX LANCETS lancets USE TO TEST THREE TIMES DAILY.  Marland Kitchen acetaminophen (TYLENOL) 325 MG tablet Take 325 mg by mouth every 12 (twelve) hours as needed for moderate pain.  Marland Kitchen albuterol (PROVENTIL HFA;VENTOLIN HFA) 108 (90 Base) MCG/ACT inhaler Inhale 2 puffs into the lungs every 6 (six) hours as needed for shortness of breath.  Marland Kitchen apixaban (ELIQUIS) 5 MG TABS tablet Take 1 tablet (5 mg total) by mouth 2 (two) times daily.  Marland Kitchen diltiazem (CARDIZEM CD) 240 MG 24 hr capsule Take 1 capsule (240 mg total) by mouth daily.  . fluticasone (FLONASE) 50 MCG/ACT nasal spray Place 2 sprays into both  nostrils daily as needed for allergies or rhinitis.  . Fluticasone-Umeclidin-Vilant (TRELEGY ELLIPTA) 100-62.5-25 MCG/INH AEPB Inhale 1 puff into the lungs daily.   . furosemide (LASIX) 40 MG tablet Take 1 tablet (40 mg total) by mouth daily.  Marland Kitchen guaiFENesin (ROBITUSSIN) 100 MG/5ML SOLN Take 5 mLs (100 mg total) by mouth every 4 (four) hours as needed for cough or to loosen phlegm.  Marland Kitchen HUMULIN R U-500 KWIKPEN 500 UNIT/ML kwikpen INJECT 20 units SUBCUTANEOUSLY 3 TIMES DAILY WITH MEALS WHEN BLOOD SUGAR IS GREATER THAN 90mg /dL.  . hydrALAZINE (APRESOLINE) 25 MG tablet Take 1 tablet (25 mg total) by mouth every 8 (eight) hours for 30 days. (Patient taking differently: Take 25 mg by mouth 3 (three) times daily. )  . ipratropium-albuterol (DUONEB) 0.5-2.5 (3) MG/3ML SOLN Take 3 mLs by nebulization every 6 (six) hours as needed (shortness of breath).  . isosorbide dinitrate (ISORDIL) 10 MG tablet Take 1 tablet (10 mg total) by mouth 3 (three) times daily.  Marland Kitchen loratadine (CLARITIN) 10 MG tablet Take 10 mg by mouth daily.  . metoprolol tartrate (LOPRESSOR) 100 MG tablet Take 100 mg by mouth daily.  Marland Kitchen NOVOFINE AUTOCOVER 30G X 8 MM MISC USE AS DIRECTED FOR 3 TIMES DAILY INSULIN ADMINISTRATION.  Marland Kitchen oxyCODONE-acetaminophen (PERCOCET/ROXICET) 5-325 MG tablet Take 1 tablet by mouth every 6 (six) hours as needed.  . SURE COMFORT PEN NEEDLES 31G X 8 MM MISC USE AS DIRECTED WITH LEVEMIR AND NOVOLOG. UP TO FOUR TIMES DAILY.  Marland Kitchen triamcinolone cream (KENALOG) 0.5 % Apply 1 application topically every 12 (twelve) hours as needed (allergic reaction).   No facility-administered encounter medications on file as of 06/15/2019.   ALLERGIES: No Known Allergies VACCINATION STATUS: Immunization History  Administered Date(s) Administered  . Hepatitis B, adult 07/29/2018, 08/29/2018, 10/07/2018  . Influenza Inj Mdck Quad Pf 12/19/2016  . Influenza,inj,Quad PF,6+ Mos 02/02/2013, 05/15/2018  . Influenza-Unspecified 05/15/2018  .  PPD Test 07/22/2018  . Pneumococcal Polysaccharide-23 05/15/2018  . Pneumococcal-Unspecified 05/15/2018    Diabetes She presents for her follow-up diabetic visit. She has type 2 diabetes mellitus. Onset time: she was diagnosed at approximate age of 62 years. Her disease course has been stable. There are no hypoglycemic associated symptoms. Pertinent negatives for hypoglycemia include no confusion, headaches, pallor or seizures. Associated symptoms include polydipsia and polyuria. Pertinent negatives for diabetes include no blurred vision, no chest pain, no fatigue and no polyphagia. There are no hypoglycemic complications. Symptoms are stable. Diabetic complications include nephropathy. Risk factors for coronary artery disease include diabetes mellitus, dyslipidemia, hypertension, obesity and sedentary lifestyle. Current diabetic treatment includes intensive insulin program. Her weight is fluctuating minimally. She  is following a generally unhealthy diet. She has had a previous visit with a dietitian. She never participates in exercise. Her home blood glucose trend is decreasing steadily. Her breakfast blood glucose range is generally 130-140 mg/dl. Her lunch blood glucose range is generally 140-180 mg/dl. Her dinner blood glucose range is generally 140-180 mg/dl. Her bedtime blood glucose range is generally 140-180 mg/dl. Her overall blood glucose range is 140-180 mg/dl. (She is accompanied by her aide from report with her logs showing near target glycemic profile.  Her point-of-care A1c is 6.4%.  No major hypoglycemia nor hyperglycemia reported.) An ACE inhibitor/angiotensin II receptor blocker is being taken.  Hypertension This is a chronic problem. The current episode started more than 1 year ago. The problem is uncontrolled. Pertinent negatives include no blurred vision, chest pain, headaches, palpitations or shortness of breath. Risk factors for coronary artery disease include diabetes mellitus,  dyslipidemia, obesity and sedentary lifestyle. Past treatments include ACE inhibitors. Hypertensive end-organ damage includes kidney disease. Identifiable causes of hypertension include chronic renal disease.  Hyperlipidemia This is a chronic problem. The current episode started more than 1 year ago. The problem is uncontrolled. Exacerbating diseases include chronic renal disease, diabetes and obesity. Pertinent negatives include no chest pain, myalgias or shortness of breath. Current antihyperlipidemic treatment includes statins and fibric acid derivatives. Risk factors for coronary artery disease include diabetes mellitus, dyslipidemia, obesity, a sedentary lifestyle and hypertension.    Review of Systems  Constitutional: Positive for unexpected weight change. Negative for chills, fatigue and fever.  HENT: Negative for trouble swallowing and voice change.   Eyes: Negative for blurred vision and visual disturbance.  Respiratory: Negative for cough, shortness of breath and wheezing.   Cardiovascular: Negative for chest pain, palpitations and leg swelling.  Gastrointestinal: Negative for diarrhea, nausea and vomiting.  Endocrine: Positive for polydipsia and polyuria. Negative for cold intolerance, heat intolerance and polyphagia.  Musculoskeletal: Positive for gait problem. Negative for arthralgias and myalgias.       She is now wheelchair-bound due to disequilibrium and deconditioning.  Skin: Negative for color change, pallor, rash and wound.  Neurological: Negative for seizures and headaches.  Psychiatric/Behavioral: Negative for confusion and suicidal ideas.    Objective:    BP (!) 155/74   Pulse (!) 45   Ht 5\' 1"  (1.549 m)   Wt 231 lb 9.6 oz (105.1 kg)   BMI 43.76 kg/m   Wt Readings from Last 3 Encounters:  06/15/19 231 lb 9.6 oz (105.1 kg)  01/10/19 240 lb (108.9 kg)  10/28/18 239 lb (108.4 kg)    Physical Exam  Constitutional: She is oriented to person, place, and time. She  appears well-developed.  HENT:  Head: Normocephalic and atraumatic.  Eyes: EOM are normal.  Neck: No tracheal deviation present. No thyromegaly present.  Cardiovascular: Normal rate.  Pulmonary/Chest: Effort normal.  Abdominal: There is no abdominal tenderness. There is no guarding.  Musculoskeletal:        General: Edema present.     Cervical back: Normal range of motion and neck supple.     Comments: Wheelchair-bound.  Neurological: She is alert and oriented to person, place, and time. No cranial nerve deficit. Coordination normal.  Skin: Skin is dry. No rash noted. No erythema. No pallor.  Psychiatric: She has a normal mood and affect. Judgment normal.    Results for orders placed or performed in visit on 06/15/19  HgB A1c  Result Value Ref Range   Hemoglobin A1C 6.4 (A) 4.0 -  5.6 %   HbA1c POC (<> result, manual entry)     HbA1c, POC (prediabetic range)     HbA1c, POC (controlled diabetic range)     Complete Blood Count (Most recent): Lab Results  Component Value Date   WBC 8.5 06/09/2018   HGB 11.9 (L) 08/25/2018   HCT 35.0 (L) 08/25/2018   MCV 97.1 06/09/2018   PLT 293 06/09/2018   Chemistry (most recent): Diabetic Labs (most recent): Lab Results  Component Value Date   HGBA1C 6.4 (A) 06/15/2019   HGBA1C 6.2 (A) 01/31/2019   HGBA1C 7.4 (H) 02/16/2018   Lipid Panel     Component Value Date/Time   CHOL 141 06/02/2018 0029   TRIG 295 (H) 06/02/2018 0029   HDL 32 (L) 06/02/2018 0029   CHOLHDL 4.4 06/02/2018 0029   VLDL 59 (H) 06/02/2018 0029   LDLCALC 50 06/02/2018 0029   LDLCALC 80 04/07/2017 0928    Assessment & Plan:   1. Type 2 diabetes mellitus with stage 4 chronic kidney disease, with long-term current use of insulin (HCC)  -Her diabetes is  complicated by CKD, congestive heart failure,  obesity/sedentary life, and patient remains at extremely high risk for more acute and chronic complications of diabetes which include CAD, CVA, CKD, retinopathy, and  neuropathy. These are all discussed in detail with the patient.  She is accompanied by her aide from report with her logs showing near target glycemic profile.  Her point-of-care A1c is 6.4%.  No major hypoglycemia nor hyperglycemia reported.  -  Glucose logs and insulin administration records pertaining to this visit,  to be scanned into patient's records.    - I have re-counseled the patient on diet management and weight loss  by adopting a carbohydrate restricted / protein rich  Diet.  - she  admits there is a room for improvement in her diet and drink choices. -  Suggestion is made for her to avoid simple carbohydrates  from her diet including Cakes, Sweet Desserts / Pastries, Ice Cream, Soda (diet and regular), Sweet Tea, Candies, Chips, Cookies, Sweet Pastries,  Store Bought Juices, Alcohol in Excess of  1-2 drinks a day, Artificial Sweeteners, Coffee Creamer, and "Sugar-free" Products. This will help patient to have stable blood glucose profile and potentially avoid unintended weight gain.   - Patient is advised to stick to a routine mealtimes to eat 3 meals  a day and avoid unnecessary snacks ( to snack only to correct hypoglycemia).  - I have approached patient with the following individualized plan to manage diabetes and patient agrees.  -Her placement in group home is the best development for her lately.  -Her presenting glycemic profile is on or near target both fasting and postprandial.   -It is better for her to remain on Humulin RU 500 insulin for simplicity reasons.   -She is advised to continue   Humulin U500  20 units  with breakfast, 20 units with lunch, and 20 units before supper for pre-meal blood glucose above 90 associated with monitoring of blood glucose before meals and at bedtime. - She is advised to skip insulin if pre-meal blood glucose is below 90 mg/dL or if she is not eating. -She is not a candidate for metformin, SGLT2 inhibitors due to CKD.   - Patient specific  target  for A1c; LDL, HDL, Triglycerides, were discussed in detail.  2) BP/HTN:  Her blood pressure is not controlled to target.  She is advised to restrict salt intake and I  advised her to continue current medications including lisinopril 20 mg p.o. daily, hydralazine 25 mg p.o. 3 times daily, furosemide 40 mg p.o. Daily.  3) Lipids/HPL: Her recent lipid panel showed controlled  LDL of 80.  She is on fenofibrate's and advised to continue.   4)  Weight/Diet: Her BMI is 43-complicating her diabetes care.  She is a candidate for modest weight loss.  CDE consult in progress, exercise, and carbohydrates information provided.  5) Chronic Care/Health Maintenance:  -Patient is  on ACEI and encouraged to continue to follow up with Ophthalmology, Podiatrist at least yearly or according to recommendations, and advised to stay away from smoking. I have recommended yearly flu vaccine and pneumonia vaccination at least every 5 years;  and  sleep for at least 7 hours a day.    - I advised patient to maintain close follow up with Rosita Fire, MD for primary care needs.  - Time spent on this patient care encounter:  35 min, of which > 50% was spent in  counseling and the rest reviewing her blood glucose logs , discussing her hypoglycemia and hyperglycemia episodes, reviewing her current and  previous labs / studies  ( including abstraction from other facilities) and medications  doses and developing a  long term treatment plan and documenting her care.   Please refer to Patient Instructions for Blood Glucose Monitoring and Insulin/Medications Dosing Guide"  in media tab for additional information. Please  also refer to " Patient Self Inventory" in the Media  tab for reviewed elements of pertinent patient history.  [name] participated in the discussions, expressed understanding, and voiced agreement with the above plans.  All questions were answered to her satisfaction. she is encouraged to contact  clinic should she have any questions or concerns prior to her return visit.    Follow up plan: -Return in about 3 months (around 09/15/2019) for Bring Meter and Logs- A1c in Office, Follow up with Pre-visit Labs.  Glade Lloyd, MD Phone: [phone]  Fax: [phone]  This note was partially dictated with voice recognition software. Similar sounding words can be transcribed inadequately or may not  be corrected upon review.  06/15/2019, 1:31 PM

## 2019-06-15 NOTE — Patient Instructions (Signed)

## 2019-06-19 DIAGNOSIS — Z20828 Contact with and (suspected) exposure to other viral communicable diseases: Secondary | ICD-10-CM | POA: Diagnosis not present

## 2019-06-19 DIAGNOSIS — U071 COVID-19: Secondary | ICD-10-CM | POA: Diagnosis not present

## 2019-06-19 DIAGNOSIS — R296 Repeated falls: Secondary | ICD-10-CM | POA: Diagnosis not present

## 2019-06-19 DIAGNOSIS — E1165 Type 2 diabetes mellitus with hyperglycemia: Secondary | ICD-10-CM | POA: Diagnosis not present

## 2019-06-19 DIAGNOSIS — I5032 Chronic diastolic (congestive) heart failure: Secondary | ICD-10-CM | POA: Diagnosis not present

## 2019-06-19 DIAGNOSIS — J449 Chronic obstructive pulmonary disease, unspecified: Secondary | ICD-10-CM | POA: Diagnosis not present

## 2019-06-19 DIAGNOSIS — M6281 Muscle weakness (generalized): Secondary | ICD-10-CM | POA: Diagnosis not present

## 2019-06-21 DIAGNOSIS — R296 Repeated falls: Secondary | ICD-10-CM | POA: Diagnosis not present

## 2019-06-21 DIAGNOSIS — M6281 Muscle weakness (generalized): Secondary | ICD-10-CM | POA: Diagnosis not present

## 2019-06-21 DIAGNOSIS — J449 Chronic obstructive pulmonary disease, unspecified: Secondary | ICD-10-CM | POA: Diagnosis not present

## 2019-06-27 DIAGNOSIS — U071 COVID-19: Secondary | ICD-10-CM | POA: Diagnosis not present

## 2019-06-27 DIAGNOSIS — Z20828 Contact with and (suspected) exposure to other viral communicable diseases: Secondary | ICD-10-CM | POA: Diagnosis not present

## 2019-07-03 DIAGNOSIS — R296 Repeated falls: Secondary | ICD-10-CM | POA: Diagnosis not present

## 2019-07-03 DIAGNOSIS — M6281 Muscle weakness (generalized): Secondary | ICD-10-CM | POA: Diagnosis not present

## 2019-07-03 DIAGNOSIS — J449 Chronic obstructive pulmonary disease, unspecified: Secondary | ICD-10-CM | POA: Diagnosis not present

## 2019-07-10 DIAGNOSIS — J449 Chronic obstructive pulmonary disease, unspecified: Secondary | ICD-10-CM | POA: Diagnosis not present

## 2019-07-10 DIAGNOSIS — M6281 Muscle weakness (generalized): Secondary | ICD-10-CM | POA: Diagnosis not present

## 2019-07-10 DIAGNOSIS — R296 Repeated falls: Secondary | ICD-10-CM | POA: Diagnosis not present

## 2019-07-10 DIAGNOSIS — Z20828 Contact with and (suspected) exposure to other viral communicable diseases: Secondary | ICD-10-CM | POA: Diagnosis not present

## 2019-07-10 DIAGNOSIS — U071 COVID-19: Secondary | ICD-10-CM | POA: Diagnosis not present

## 2019-07-12 DIAGNOSIS — E211 Secondary hyperparathyroidism, not elsewhere classified: Secondary | ICD-10-CM | POA: Diagnosis not present

## 2019-07-12 DIAGNOSIS — I5032 Chronic diastolic (congestive) heart failure: Secondary | ICD-10-CM | POA: Diagnosis not present

## 2019-07-12 DIAGNOSIS — N189 Chronic kidney disease, unspecified: Secondary | ICD-10-CM | POA: Diagnosis not present

## 2019-07-12 DIAGNOSIS — E559 Vitamin D deficiency, unspecified: Secondary | ICD-10-CM | POA: Diagnosis not present

## 2019-07-12 DIAGNOSIS — N1832 Chronic kidney disease, stage 3b: Secondary | ICD-10-CM | POA: Diagnosis not present

## 2019-07-12 DIAGNOSIS — I10 Essential (primary) hypertension: Secondary | ICD-10-CM | POA: Diagnosis not present

## 2019-07-12 DIAGNOSIS — D631 Anemia in chronic kidney disease: Secondary | ICD-10-CM | POA: Diagnosis not present

## 2019-07-17 DIAGNOSIS — Z20828 Contact with and (suspected) exposure to other viral communicable diseases: Secondary | ICD-10-CM | POA: Diagnosis not present

## 2019-07-17 DIAGNOSIS — U071 COVID-19: Secondary | ICD-10-CM | POA: Diagnosis not present

## 2019-07-18 DIAGNOSIS — U071 COVID-19: Secondary | ICD-10-CM | POA: Diagnosis not present

## 2019-07-18 DIAGNOSIS — Z20828 Contact with and (suspected) exposure to other viral communicable diseases: Secondary | ICD-10-CM | POA: Diagnosis not present

## 2019-07-20 DIAGNOSIS — I5032 Chronic diastolic (congestive) heart failure: Secondary | ICD-10-CM | POA: Diagnosis not present

## 2019-07-20 DIAGNOSIS — E1165 Type 2 diabetes mellitus with hyperglycemia: Secondary | ICD-10-CM | POA: Diagnosis not present

## 2019-08-03 DIAGNOSIS — I5032 Chronic diastolic (congestive) heart failure: Secondary | ICD-10-CM | POA: Diagnosis not present

## 2019-08-03 DIAGNOSIS — R809 Proteinuria, unspecified: Secondary | ICD-10-CM | POA: Diagnosis not present

## 2019-08-03 DIAGNOSIS — E211 Secondary hyperparathyroidism, not elsewhere classified: Secondary | ICD-10-CM | POA: Diagnosis not present

## 2019-08-03 DIAGNOSIS — E1129 Type 2 diabetes mellitus with other diabetic kidney complication: Secondary | ICD-10-CM | POA: Diagnosis not present

## 2019-08-03 DIAGNOSIS — I10 Essential (primary) hypertension: Secondary | ICD-10-CM | POA: Diagnosis not present

## 2019-08-03 DIAGNOSIS — N189 Chronic kidney disease, unspecified: Secondary | ICD-10-CM | POA: Diagnosis not present

## 2019-08-03 DIAGNOSIS — D631 Anemia in chronic kidney disease: Secondary | ICD-10-CM | POA: Diagnosis not present

## 2019-08-03 DIAGNOSIS — E1122 Type 2 diabetes mellitus with diabetic chronic kidney disease: Secondary | ICD-10-CM | POA: Diagnosis not present

## 2019-08-15 ENCOUNTER — Ambulatory Visit: Payer: Medicare Other | Admitting: Family Medicine

## 2019-08-22 DIAGNOSIS — R278 Other lack of coordination: Secondary | ICD-10-CM | POA: Diagnosis not present

## 2019-08-22 DIAGNOSIS — R269 Unspecified abnormalities of gait and mobility: Secondary | ICD-10-CM | POA: Diagnosis not present

## 2019-08-22 DIAGNOSIS — I5032 Chronic diastolic (congestive) heart failure: Secondary | ICD-10-CM | POA: Diagnosis not present

## 2019-08-22 DIAGNOSIS — E1122 Type 2 diabetes mellitus with diabetic chronic kidney disease: Secondary | ICD-10-CM | POA: Diagnosis not present

## 2019-08-22 DIAGNOSIS — J449 Chronic obstructive pulmonary disease, unspecified: Secondary | ICD-10-CM | POA: Diagnosis not present

## 2019-08-22 DIAGNOSIS — N1831 Chronic kidney disease, stage 3a: Secondary | ICD-10-CM | POA: Diagnosis not present

## 2019-08-24 DIAGNOSIS — R278 Other lack of coordination: Secondary | ICD-10-CM | POA: Diagnosis not present

## 2019-08-24 DIAGNOSIS — R269 Unspecified abnormalities of gait and mobility: Secondary | ICD-10-CM | POA: Diagnosis not present

## 2019-08-31 DIAGNOSIS — R278 Other lack of coordination: Secondary | ICD-10-CM | POA: Diagnosis not present

## 2019-08-31 DIAGNOSIS — R269 Unspecified abnormalities of gait and mobility: Secondary | ICD-10-CM | POA: Diagnosis not present

## 2019-09-05 DIAGNOSIS — R269 Unspecified abnormalities of gait and mobility: Secondary | ICD-10-CM | POA: Diagnosis not present

## 2019-09-05 DIAGNOSIS — R278 Other lack of coordination: Secondary | ICD-10-CM | POA: Diagnosis not present

## 2019-09-06 DIAGNOSIS — R42 Dizziness and giddiness: Secondary | ICD-10-CM | POA: Diagnosis not present

## 2019-09-06 DIAGNOSIS — I5032 Chronic diastolic (congestive) heart failure: Secondary | ICD-10-CM | POA: Diagnosis not present

## 2019-09-06 DIAGNOSIS — H04123 Dry eye syndrome of bilateral lacrimal glands: Secondary | ICD-10-CM | POA: Diagnosis not present

## 2019-09-07 DIAGNOSIS — R269 Unspecified abnormalities of gait and mobility: Secondary | ICD-10-CM | POA: Diagnosis not present

## 2019-09-07 DIAGNOSIS — R278 Other lack of coordination: Secondary | ICD-10-CM | POA: Diagnosis not present

## 2019-09-12 DIAGNOSIS — R269 Unspecified abnormalities of gait and mobility: Secondary | ICD-10-CM | POA: Diagnosis not present

## 2019-09-12 DIAGNOSIS — R278 Other lack of coordination: Secondary | ICD-10-CM | POA: Diagnosis not present

## 2019-09-13 LAB — COMPLETE METABOLIC PANEL WITH GFR
AG Ratio: 1.4 (calc) (ref 1.0–2.5)
ALT: 11 U/L (ref 6–29)
AST: 12 U/L (ref 10–35)
Albumin: 3.8 g/dL (ref 3.6–5.1)
Alkaline phosphatase (APISO): 93 U/L (ref 37–153)
BUN/Creatinine Ratio: 29 (calc) — ABNORMAL HIGH (ref 6–22)
BUN: 46 mg/dL — ABNORMAL HIGH (ref 7–25)
CO2: 31 mmol/L (ref 20–32)
Calcium: 8.7 mg/dL (ref 8.6–10.4)
Chloride: 98 mmol/L (ref 98–110)
Creat: 1.57 mg/dL — ABNORMAL HIGH (ref 0.50–0.99)
GFR, Est African American: 40 mL/min/{1.73_m2} — ABNORMAL LOW (ref >=60)
GFR, Est Non African American: 35 mL/min/{1.73_m2} — ABNORMAL LOW (ref >=60)
Globulin: 2.8 g/dL (calc) (ref 1.9–3.7)
Glucose, Bld: 219 mg/dL — ABNORMAL HIGH (ref 65–99)
Potassium: 3.4 mmol/L — ABNORMAL LOW (ref 3.5–5.3)
Sodium: 139 mmol/L (ref 135–146)
Total Bilirubin: 0.4 mg/dL (ref 0.2–1.2)
Total Protein: 6.6 g/dL (ref 6.1–8.1)

## 2019-09-13 LAB — LIPID PANEL
Cholesterol: 191 mg/dL (ref <200)
HDL: 35 mg/dL — ABNORMAL LOW (ref >=50)
LDL Cholesterol (Calc): 124 mg/dL (calc) — ABNORMAL HIGH
Non-HDL Cholesterol (Calc): 156 mg/dL (calc) — ABNORMAL HIGH (ref <130)
Total CHOL/HDL Ratio: 5.5 (calc) — ABNORMAL HIGH (ref <5.0)
Triglycerides: 201 mg/dL — ABNORMAL HIGH (ref <150)

## 2019-09-13 LAB — MICROALBUMIN / CREATININE URINE RATIO
Creatinine, Urine: 49 mg/dL (ref 20–275)
Microalb Creat Ratio: 478 mcg/mg creat — ABNORMAL HIGH (ref <30)
Microalb, Ur: 23.4 mg/dL

## 2019-09-13 LAB — T4, FREE: Free T4: 1.3 ng/dL (ref 0.8–1.8)

## 2019-09-13 LAB — TSH: TSH: 2.32 mIU/L (ref 0.40–4.50)

## 2019-09-14 DIAGNOSIS — E211 Secondary hyperparathyroidism, not elsewhere classified: Secondary | ICD-10-CM | POA: Diagnosis not present

## 2019-09-14 DIAGNOSIS — D631 Anemia in chronic kidney disease: Secondary | ICD-10-CM | POA: Diagnosis not present

## 2019-09-14 DIAGNOSIS — N189 Chronic kidney disease, unspecified: Secondary | ICD-10-CM | POA: Diagnosis not present

## 2019-09-14 DIAGNOSIS — I10 Essential (primary) hypertension: Secondary | ICD-10-CM | POA: Diagnosis not present

## 2019-09-14 DIAGNOSIS — E1129 Type 2 diabetes mellitus with other diabetic kidney complication: Secondary | ICD-10-CM | POA: Diagnosis not present

## 2019-09-14 DIAGNOSIS — E876 Hypokalemia: Secondary | ICD-10-CM | POA: Diagnosis not present

## 2019-09-14 DIAGNOSIS — I5032 Chronic diastolic (congestive) heart failure: Secondary | ICD-10-CM | POA: Diagnosis not present

## 2019-09-14 DIAGNOSIS — E1122 Type 2 diabetes mellitus with diabetic chronic kidney disease: Secondary | ICD-10-CM | POA: Diagnosis not present

## 2019-09-14 DIAGNOSIS — R809 Proteinuria, unspecified: Secondary | ICD-10-CM | POA: Diagnosis not present

## 2019-09-19 ENCOUNTER — Emergency Department (HOSPITAL_COMMUNITY): Payer: Medicare Other

## 2019-09-19 ENCOUNTER — Encounter (HOSPITAL_COMMUNITY): Payer: Self-pay | Admitting: Emergency Medicine

## 2019-09-19 ENCOUNTER — Encounter: Payer: Self-pay | Admitting: "Endocrinology

## 2019-09-19 ENCOUNTER — Other Ambulatory Visit: Payer: Self-pay

## 2019-09-19 ENCOUNTER — Ambulatory Visit (INDEPENDENT_AMBULATORY_CARE_PROVIDER_SITE_OTHER): Payer: Medicare Other | Admitting: "Endocrinology

## 2019-09-19 ENCOUNTER — Observation Stay (HOSPITAL_COMMUNITY)
Admission: EM | Admit: 2019-09-19 | Discharge: 2019-09-20 | Disposition: A | Discharge status: Skilled Nursing Facility | Payer: Medicare Other | Attending: Internal Medicine | Admitting: Internal Medicine

## 2019-09-19 VITALS — BP 109/65 | HR 76 | Ht 61.0 in | Wt 243.0 lb

## 2019-09-19 DIAGNOSIS — J45909 Unspecified asthma, uncomplicated: Secondary | ICD-10-CM | POA: Insufficient documentation

## 2019-09-19 DIAGNOSIS — D72828 Other elevated white blood cell count: Secondary | ICD-10-CM | POA: Insufficient documentation

## 2019-09-19 DIAGNOSIS — Z87891 Personal history of nicotine dependence: Secondary | ICD-10-CM | POA: Diagnosis not present

## 2019-09-19 DIAGNOSIS — R55 Syncope and collapse: Secondary | ICD-10-CM | POA: Insufficient documentation

## 2019-09-19 DIAGNOSIS — Z86010 Personal history of colonic polyps: Secondary | ICD-10-CM | POA: Diagnosis not present

## 2019-09-19 DIAGNOSIS — Z743 Need for continuous supervision: Secondary | ICD-10-CM | POA: Diagnosis not present

## 2019-09-19 DIAGNOSIS — I2510 Atherosclerotic heart disease of native coronary artery without angina pectoris: Secondary | ICD-10-CM | POA: Diagnosis not present

## 2019-09-19 DIAGNOSIS — Z79899 Other long term (current) drug therapy: Secondary | ICD-10-CM | POA: Insufficient documentation

## 2019-09-19 DIAGNOSIS — N189 Chronic kidney disease, unspecified: Secondary | ICD-10-CM

## 2019-09-19 DIAGNOSIS — I480 Paroxysmal atrial fibrillation: Secondary | ICD-10-CM | POA: Insufficient documentation

## 2019-09-19 DIAGNOSIS — I130 Hypertensive heart and chronic kidney disease with heart failure and stage 1 through stage 4 chronic kidney disease, or unspecified chronic kidney disease: Secondary | ICD-10-CM | POA: Diagnosis not present

## 2019-09-19 DIAGNOSIS — Z8249 Family history of ischemic heart disease and other diseases of the circulatory system: Secondary | ICD-10-CM | POA: Insufficient documentation

## 2019-09-19 DIAGNOSIS — Z9049 Acquired absence of other specified parts of digestive tract: Secondary | ICD-10-CM | POA: Diagnosis not present

## 2019-09-19 DIAGNOSIS — I10 Essential (primary) hypertension: Secondary | ICD-10-CM

## 2019-09-19 DIAGNOSIS — Z794 Long term (current) use of insulin: Secondary | ICD-10-CM | POA: Diagnosis not present

## 2019-09-19 DIAGNOSIS — G4733 Obstructive sleep apnea (adult) (pediatric): Secondary | ICD-10-CM | POA: Diagnosis not present

## 2019-09-19 DIAGNOSIS — E875 Hyperkalemia: Secondary | ICD-10-CM | POA: Insufficient documentation

## 2019-09-19 DIAGNOSIS — E1122 Type 2 diabetes mellitus with diabetic chronic kidney disease: Secondary | ICD-10-CM | POA: Insufficient documentation

## 2019-09-19 DIAGNOSIS — Z6841 Body Mass Index (BMI) 40.0 and over, adult: Secondary | ICD-10-CM | POA: Diagnosis not present

## 2019-09-19 DIAGNOSIS — E1165 Type 2 diabetes mellitus with hyperglycemia: Secondary | ICD-10-CM | POA: Diagnosis not present

## 2019-09-19 DIAGNOSIS — R404 Transient alteration of awareness: Secondary | ICD-10-CM | POA: Diagnosis not present

## 2019-09-19 DIAGNOSIS — N1832 Chronic kidney disease, stage 3b: Secondary | ICD-10-CM | POA: Insufficient documentation

## 2019-09-19 DIAGNOSIS — I5032 Chronic diastolic (congestive) heart failure: Secondary | ICD-10-CM | POA: Diagnosis not present

## 2019-09-19 DIAGNOSIS — R001 Bradycardia, unspecified: Principal | ICD-10-CM | POA: Diagnosis present

## 2019-09-19 DIAGNOSIS — D509 Iron deficiency anemia, unspecified: Secondary | ICD-10-CM | POA: Diagnosis not present

## 2019-09-19 DIAGNOSIS — Z992 Dependence on renal dialysis: Secondary | ICD-10-CM | POA: Insufficient documentation

## 2019-09-19 DIAGNOSIS — E782 Mixed hyperlipidemia: Secondary | ICD-10-CM | POA: Insufficient documentation

## 2019-09-19 DIAGNOSIS — Z20822 Contact with and (suspected) exposure to covid-19: Secondary | ICD-10-CM | POA: Diagnosis not present

## 2019-09-19 DIAGNOSIS — Z833 Family history of diabetes mellitus: Secondary | ICD-10-CM | POA: Insufficient documentation

## 2019-09-19 DIAGNOSIS — N184 Chronic kidney disease, stage 4 (severe): Secondary | ICD-10-CM

## 2019-09-19 DIAGNOSIS — M1990 Unspecified osteoarthritis, unspecified site: Secondary | ICD-10-CM | POA: Diagnosis not present

## 2019-09-19 DIAGNOSIS — N179 Acute kidney failure, unspecified: Secondary | ICD-10-CM | POA: Insufficient documentation

## 2019-09-19 DIAGNOSIS — Z7901 Long term (current) use of anticoagulants: Secondary | ICD-10-CM | POA: Insufficient documentation

## 2019-09-19 DIAGNOSIS — I499 Cardiac arrhythmia, unspecified: Secondary | ICD-10-CM | POA: Diagnosis not present

## 2019-09-19 DIAGNOSIS — Z90710 Acquired absence of both cervix and uterus: Secondary | ICD-10-CM | POA: Diagnosis not present

## 2019-09-19 LAB — COMPREHENSIVE METABOLIC PANEL
ALT: 23 U/L (ref 0–44)
AST: 24 U/L (ref 15–41)
Albumin: 3.5 g/dL (ref 3.5–5.0)
Alkaline Phosphatase: 87 U/L (ref 38–126)
Anion gap: 11 (ref 5–15)
BUN: 76 mg/dL — ABNORMAL HIGH (ref 8–23)
CO2: 24 mmol/L (ref 22–32)
Calcium: 9.2 mg/dL (ref 8.9–10.3)
Chloride: 100 mmol/L (ref 98–111)
Creatinine, Ser: 2.28 mg/dL — ABNORMAL HIGH (ref 0.44–1.00)
GFR calc Af Amer: 26 mL/min — ABNORMAL LOW (ref >60)
GFR calc non Af Amer: 22 mL/min — ABNORMAL LOW (ref >60)
Glucose, Bld: 265 mg/dL — ABNORMAL HIGH (ref 70–99)
Potassium: 5.2 mmol/L — ABNORMAL HIGH (ref 3.5–5.1)
Sodium: 135 mmol/L (ref 135–145)
Total Bilirubin: 0.4 mg/dL (ref 0.3–1.2)
Total Protein: 7.3 g/dL (ref 6.5–8.1)

## 2019-09-19 LAB — POCT GLYCOSYLATED HEMOGLOBIN (HGB A1C): Hemoglobin A1C: 7.7 % — AB (ref 4.0–5.6)

## 2019-09-19 LAB — CBC WITH DIFFERENTIAL/PLATELET
Abs Immature Granulocytes: 0.13 10*3/uL — ABNORMAL HIGH (ref 0.00–0.07)
Basophils Absolute: 0.1 10*3/uL (ref 0.0–0.1)
Basophils Relative: 1 %
Eosinophils Absolute: 0.1 10*3/uL (ref 0.0–0.5)
Eosinophils Relative: 1 %
HCT: 33.9 % — ABNORMAL LOW (ref 36.0–46.0)
Hemoglobin: 10.9 g/dL — ABNORMAL LOW (ref 12.0–15.0)
Immature Granulocytes: 1 %
Lymphocytes Relative: 11 %
Lymphs Abs: 1.7 10*3/uL (ref 0.7–4.0)
MCH: 30.4 pg (ref 26.0–34.0)
MCHC: 32.2 g/dL (ref 30.0–36.0)
MCV: 94.4 fL (ref 80.0–100.0)
Monocytes Absolute: 0.9 10*3/uL (ref 0.1–1.0)
Monocytes Relative: 6 %
Neutro Abs: 11.9 10*3/uL — ABNORMAL HIGH (ref 1.7–7.7)
Neutrophils Relative %: 80 %
Platelets: 353 10*3/uL (ref 150–400)
RBC: 3.59 MIL/uL — ABNORMAL LOW (ref 3.87–5.11)
RDW: 14.5 % (ref 11.5–15.5)
WBC: 14.8 10*3/uL — ABNORMAL HIGH (ref 4.0–10.5)
nRBC: 0 % (ref 0.0–0.2)

## 2019-09-19 LAB — URINALYSIS, ROUTINE W REFLEX MICROSCOPIC
Bacteria, UA: NONE SEEN
Bilirubin Urine: NEGATIVE
Glucose, UA: NEGATIVE mg/dL
Ketones, ur: NEGATIVE mg/dL
Nitrite: NEGATIVE
Protein, ur: NEGATIVE mg/dL
Specific Gravity, Urine: 1.01 (ref 1.005–1.030)
pH: 5 (ref 5.0–8.0)

## 2019-09-19 LAB — MAGNESIUM: Magnesium: 2 mg/dL (ref 1.7–2.4)

## 2019-09-19 LAB — TROPONIN I (HIGH SENSITIVITY)
Troponin I (High Sensitivity): 9 ng/L (ref <18)
Troponin I (High Sensitivity): 4 ng/L (ref <18)

## 2019-09-19 LAB — TSH: TSH: 2.446 u[IU]/mL (ref 0.350–4.500)

## 2019-09-19 LAB — SARS CORONAVIRUS 2 BY RT PCR (HOSPITAL ORDER, PERFORMED IN ~~LOC~~ HOSPITAL LAB): SARS Coronavirus 2: NEGATIVE

## 2019-09-19 LAB — GLUCOSE, CAPILLARY: Glucose-Capillary: 276 mg/dL — ABNORMAL HIGH (ref 70–99)

## 2019-09-19 MED ORDER — LORATADINE 10 MG PO TABS
10.0000 mg | ORAL_TABLET | Freq: Every day | ORAL | Status: DC
  Administered 2019-09-20: 10 mg via ORAL
  Filled 2019-09-19: qty 1

## 2019-09-19 MED ORDER — SODIUM CHLORIDE 0.9 % IV SOLN: INTRAVENOUS | Status: DC

## 2019-09-19 MED ORDER — ROSUVASTATIN CALCIUM 20 MG PO TABS
20.0000 mg | ORAL_TABLET | Freq: Every day | ORAL | 1 refills | Status: DC
Start: 2019-09-19 — End: 2019-12-20

## 2019-09-19 MED ORDER — SODIUM CHLORIDE 0.9 % IV BOLUS
500.0000 mL | Freq: Once | INTRAVENOUS | Status: AC
  Administered 2019-09-19: 500 mL via INTRAVENOUS

## 2019-09-19 MED ORDER — FLUTICASONE FUROATE-VILANTEROL 200-25 MCG/INH IN AEPB
1.0000 | INHALATION_SPRAY | Freq: Every day | RESPIRATORY_TRACT | Status: DC
  Administered 2019-09-20: 1 via RESPIRATORY_TRACT
  Filled 2019-09-19: qty 28

## 2019-09-19 MED ORDER — HEPARIN SODIUM (PORCINE) 5000 UNIT/ML IJ SOLN
5000.0000 [IU] | Freq: Three times a day (TID) | INTRAMUSCULAR | Status: DC
  Administered 2019-09-20 (×2): 5000 [IU] via SUBCUTANEOUS
  Filled 2019-09-19 (×3): qty 1

## 2019-09-19 MED ORDER — GUAIFENESIN-DM 100-10 MG/5ML PO SYRP: 10.0000 mL | ORAL_SOLUTION | Freq: Four times a day (QID) | ORAL | Status: DC | PRN

## 2019-09-19 MED ORDER — INSULIN ASPART 100 UNIT/ML ~~LOC~~ SOLN
0.0000 [IU] | Freq: Three times a day (TID) | SUBCUTANEOUS | Status: DC
  Administered 2019-09-20: 8 [IU] via SUBCUTANEOUS
  Administered 2019-09-20: 5 [IU] via SUBCUTANEOUS

## 2019-09-19 MED ORDER — FLUTICASONE-UMECLIDIN-VILANT 100-62.5-25 MCG/INH IN AEPB: 1.0000 | INHALATION_SPRAY | Freq: Every day | RESPIRATORY_TRACT | Status: DC

## 2019-09-19 MED ORDER — ONDANSETRON HCL 4 MG/2ML IJ SOLN: 4.0000 mg | Freq: Four times a day (QID) | INTRAMUSCULAR | Status: DC | PRN

## 2019-09-19 MED ORDER — APIXABAN 5 MG PO TABS: 5.0000 mg | ORAL_TABLET | Freq: Two times a day (BID) | ORAL | Status: DC

## 2019-09-19 MED ORDER — POLYETHYLENE GLYCOL 3350 17 G PO PACK: 17.0000 g | PACK | Freq: Every day | ORAL | Status: DC | PRN

## 2019-09-19 MED ORDER — ONDANSETRON HCL 4 MG PO TABS: 4.0000 mg | ORAL_TABLET | Freq: Four times a day (QID) | ORAL | Status: DC | PRN

## 2019-09-19 MED ORDER — IPRATROPIUM-ALBUTEROL 0.5-2.5 (3) MG/3ML IN SOLN: 3.0000 mL | Freq: Four times a day (QID) | RESPIRATORY_TRACT | Status: DC | PRN

## 2019-09-19 MED ORDER — FLUTICASONE PROPIONATE 50 MCG/ACT NA SUSP: 2.0000 | Freq: Every day | NASAL | Status: DC | PRN

## 2019-09-19 MED ORDER — ACETAMINOPHEN 325 MG PO TABS: 650.0000 mg | ORAL_TABLET | Freq: Four times a day (QID) | ORAL | Status: DC | PRN

## 2019-09-19 MED ORDER — INSULIN ASPART 100 UNIT/ML ~~LOC~~ SOLN
0.0000 [IU] | Freq: Every day | SUBCUTANEOUS | Status: DC
  Administered 2019-09-19: 2 [IU] via SUBCUTANEOUS

## 2019-09-19 MED ORDER — MONTELUKAST SODIUM 10 MG PO TABS
10.0000 mg | ORAL_TABLET | Freq: Every day | ORAL | Status: DC
  Filled 2019-09-19: qty 1

## 2019-09-19 MED ORDER — ACETAMINOPHEN 650 MG RE SUPP: 650.0000 mg | Freq: Four times a day (QID) | RECTAL | Status: DC | PRN

## 2019-09-19 MED ORDER — HUMULIN R U-500 KWIKPEN 500 UNIT/ML ~~LOC~~ SOPN: 25.0000 [IU] | PEN_INJECTOR | Freq: Three times a day (TID) | SUBCUTANEOUS | 2 refills | Status: DC

## 2019-09-19 MED ORDER — UMECLIDINIUM BROMIDE 62.5 MCG/INH IN AEPB
1.0000 | INHALATION_SPRAY | Freq: Every day | RESPIRATORY_TRACT | Status: DC
  Administered 2019-09-20: 1 via RESPIRATORY_TRACT
  Filled 2019-09-19: qty 7

## 2019-09-19 NOTE — Progress Notes (Signed)
09/19/2019  Endocrinology follow-up note   Subjective:    Patient ID: [name], female    DOB: 03-05-1956, PCP Rosita Fire, MD   Past Medical History:  Diagnosis Date  . Arthritis   . Asthma   . Atrial fibrillation ([name])   . Chronic diastolic CHF (congestive heart failure) ([name])   . CKD (chronic kidney disease), stage III   . Diabetes mellitus type 2 in obese ([name])   . Hypertension   . Iron deficiency anemia 10/16/2010  . Mental handicap 10/16/2010  . Mild CAD 2013  . Morbid obesity ([name])   . OSA (obstructive sleep apnea)    Past Surgical History:  Procedure Laterality Date  . ABDOMINAL HYSTERECTOMY    . AV FISTULA PLACEMENT Right 06/06/2018   Procedure: ARTERIOVENOUS (AV) FISTULA CREATION;  Surgeon: Marty Heck, MD;  Location: [place];  Service: Vascular;  Laterality: Right;  . CESAREAN SECTION    . CHOLECYSTECTOMY    . COLONOSCOPY  08/2010   normal TI, sigmoid polyp (adenoma ). Next TCS due  08/2015,  . ESOPHAGOGASTRODUODENOSCOPY  08/2010   antral and duodenal erosions s/p bx (chronic gastritis, no h.pylori, no celiac dz ), hiatal hernia  . FISTULA SUPERFICIALIZATION Right 08/25/2018   Procedure: FISTULA SUPERFICIALIZATION RIGHT ARM;  Surgeon: Waynetta Sandy, MD;  Location: [place];  Service: Cardiovascular;  Laterality: Right;  . IR FLUORO GUIDE CV LINE RIGHT  05/31/2018  . IR US GUIDE VASC ACCESS RIGHT  05/31/2018  . KNEE SURGERY     right knee @ 64 years of age  . LEFT AND RIGHT HEART CATHETERIZATION WITH CORONARY ANGIOGRAM N/A 09/21/2011   Procedure: LEFT AND RIGHT HEART CATHETERIZATION WITH CORONARY ANGIOGRAM;  Surgeon: Birdie Riddle, MD;  Location: [facility];  Service: Cardiovascular;  Laterality: N/A;   Social History   Socioeconomic History  . Marital status: Divorced    Spouse name: Not on file  . Number of children: 1  . Years of education: Not on file  . Highest education level: Not on file  Occupational History    Employer:  UNEMPLOYED  Tobacco Use  . Smoking status: Former Smoker    Packs/day: 1.00    Years: 0.00    Pack years: 0.00    Types: Cigarettes    Quit date: 04/11/1996    Years since quitting: 23.4  . Smokeless tobacco: Never Used  . Tobacco comment: quit a couple year ago  Vaping Use  . Vaping Use: Never used  Substance and Sexual Activity  . Alcohol use: No  . Drug use: No  . Sexual activity: Never  Other Topics Concern  . Not on file  Social History Narrative   Lives with parents.    Social Determinants of Health   Financial Resource Strain:   . Difficulty of Paying Living Expenses:   Food Insecurity:   . Worried About Charity fundraiser in the Last Year:   . Arboriculturist in the Last Year:   Transportation Needs:   . Film/video editor (Medical):   Marland Kitchen Lack of Transportation (Non-Medical):   Physical Activity:   . Days of Exercise per Week:   . Minutes of Exercise per Session:   Stress:   . Feeling of Stress :   Social Connections:   . Frequency of Communication with Friends and Family:   . Frequency of Social Gatherings with Friends and Family:   . Attends Religious Services:   . Active Member  of Clubs or Organizations:   . Attends Archivist Meetings:   Marland Kitchen Marital Status:    Outpatient Encounter Medications as of 09/19/2019  Medication Sig  . ACCU-CHEK AVIVA PLUS test strip USE TO TEST THREE TIMES DAILY.  Marland Kitchen ACCU-CHEK SOFTCLIX LANCETS lancets USE TO TEST THREE TIMES DAILY.  Marland Kitchen acetaminophen (TYLENOL) 325 MG tablet Take 325 mg by mouth every 12 (twelve) hours as needed for moderate pain.  Marland Kitchen albuterol (PROVENTIL HFA;VENTOLIN HFA) 108 (90 Base) MCG/ACT inhaler Inhale 2 puffs into the lungs every 6 (six) hours as needed for shortness of breath.  Marland Kitchen apixaban (ELIQUIS) 5 MG TABS tablet Take 1 tablet (5 mg total) by mouth 2 (two) times daily.  . betamethasone dipropionate (DIPROLENE) 0.05 % ointment   . betamethasone dipropionate 0.05 % cream Apply topically.  .  cetirizine (ZYRTEC) 10 MG tablet Take 10 mg by mouth daily.  Marland Kitchen CLOTRIMAZOLE ANTI-FUNGAL EX Apply 1 % topically.  . clotrimazole-betamethasone (LOTRISONE) cream Apply topically.  Marland Kitchen DIABETIC TUSSIN EX 100 MG/5ML syrup SMARTSIG:5 Milliliter(s) By Mouth Every 4 Hours PRN  . diltiazem (CARDIZEM CD) 240 MG 24 hr capsule Take 1 capsule (240 mg total) by mouth daily.  . fluticasone (FLONASE) 50 MCG/ACT nasal spray Place 2 sprays into both nostrils daily as needed for allergies or rhinitis.  . Fluticasone-Umeclidin-Vilant (TRELEGY ELLIPTA) 100-62.5-25 MCG/INH AEPB Inhale 1 puff into the lungs daily.   . furosemide (LASIX) 40 MG tablet TAKE 1 TABLET BY MOUTH TWICE A DAY ALTERNATING WITH 40 MG ONCE DAILY DOSE  . guaiFENesin (ROBITUSSIN) 100 MG/5ML SOLN Take 5 mLs (100 mg total) by mouth every 4 (four) hours as needed for cough or to loosen phlegm.  . hydrALAZINE (APRESOLINE) 25 MG tablet Take by mouth.  . insulin regular human CONCENTRATED (HUMULIN R U-500 KWIKPEN) 500 UNIT/ML kwikpen Inject 25 Units into the skin 3 (three) times daily with meals.  Marland Kitchen ipratropium-albuterol (DUONEB) 0.5-2.5 (3) MG/3ML SOLN Take 3 mLs by nebulization every 6 (six) hours as needed (shortness of breath).  . isosorbide dinitrate (ISORDIL) 10 MG tablet Take 1 tablet (10 mg total) by mouth 3 (three) times daily.  Marland Kitchen lisinopril (ZESTRIL) 5 MG tablet Take by mouth.  . metoprolol tartrate (LOPRESSOR) 100 MG tablet Take 100 mg by mouth daily.  . montelukast (SINGULAIR) 10 MG tablet Take by mouth.  Marland Kitchen NOVOFINE AUTOCOVER 30G X 8 MM MISC USE AS DIRECTED FOR 3 TIMES DAILY INSULIN ADMINISTRATION.  . SURE COMFORT PEN NEEDLES 31G X 8 MM MISC USE AS DIRECTED WITH LEVEMIR AND NOVOLOG. UP TO FOUR TIMES DAILY.  Marland Kitchen triamcinolone cream (KENALOG) 0.5 % Apply 1 application topically every 12 (twelve) hours as needed (allergic reaction).  . Vitamin D, Cholecalciferol, 25 MCG (1000 UT) TABS Take 1 tablet by mouth daily.  . [DISCONTINUED] HUMULIN R U-500  KWIKPEN 500 UNIT/ML kwikpen INJECT 20 units SUBCUTANEOUSLY 3 TIMES DAILY WITH MEALS WHEN BLOOD SUGAR IS GREATER THAN 90mg /dL.  . furosemide (LASIX) 40 MG tablet Take 1 tablet (40 mg total) by mouth daily. (Patient not taking: Reported on 09/19/2019)  . hydrALAZINE (APRESOLINE) 25 MG tablet Take 1 tablet (25 mg total) by mouth every 8 (eight) hours for 30 days. (Patient taking differently: Take 25 mg by mouth 3 (three) times daily. )  . loratadine (CLARITIN) 10 MG tablet Take 10 mg by mouth daily. (Patient not taking: Reported on 09/19/2019)  . metolazone (ZAROXOLYN) 5 MG tablet Take by mouth.  . oxyCODONE-acetaminophen (PERCOCET/ROXICET) 5-325 MG tablet Take 1  tablet by mouth every 6 (six) hours as needed. (Patient not taking: Reported on 09/19/2019)  . potassium chloride SA (KLOR-CON) 20 MEQ tablet Take 20 mEq by mouth 2 (two) times daily.  . rosuvastatin (CRESTOR) 20 MG tablet Take 1 tablet (20 mg total) by mouth daily.   No facility-administered encounter medications on file as of 09/19/2019.   ALLERGIES: No Known Allergies VACCINATION STATUS: Immunization History  Administered Date(s) Administered  . Hepatitis B, adult 07/29/2018, 08/29/2018, 10/07/2018  . Influenza Inj Mdck Quad Pf 12/19/2016  . Influenza,inj,Quad PF,6+ Mos 02/02/2013, 05/15/2018  . Influenza-Unspecified 05/15/2018  . PPD Test 07/22/2018  . Pneumococcal Polysaccharide-23 05/15/2018  . Pneumococcal-Unspecified 05/15/2018    Diabetes She presents for her follow-up diabetic visit. She has type 2 diabetes mellitus. Onset time: she was diagnosed at approximate age of 29 years. Her disease course has been worsening. There are no hypoglycemic associated symptoms. Pertinent negatives for hypoglycemia include no confusion, headaches, pallor or seizures. Associated symptoms include polydipsia and polyuria. Pertinent negatives for diabetes include no blurred vision, no chest pain, no fatigue and no polyphagia. There are no  hypoglycemic complications. Symptoms are worsening. Diabetic complications include nephropathy. Risk factors for coronary artery disease include diabetes mellitus, dyslipidemia, hypertension, obesity and sedentary lifestyle. Current diabetic treatment includes intensive insulin program. Her weight is increasing steadily. She is following a generally unhealthy diet. She has had a previous visit with a dietitian. She never participates in exercise. Her home blood glucose trend is increasing steadily. Her breakfast blood glucose range is generally 140-180 mg/dl. Her lunch blood glucose range is generally 140-180 mg/dl. Her dinner blood glucose range is generally 140-180 mg/dl. Her bedtime blood glucose range is generally 140-180 mg/dl. Her overall blood glucose range is 140-180 mg/dl. (She is accompanied by her aide from report with her logs showing near target glycemic profile.  Her point-of-care A1c is 6.4%.  No major hypoglycemia nor hyperglycemia reported.) An ACE inhibitor/angiotensin II receptor blocker is being taken.  Hypertension This is a chronic problem. The current episode started more than 1 year ago. The problem is uncontrolled. Pertinent negatives include no blurred vision, chest pain, headaches, palpitations or shortness of breath. Risk factors for coronary artery disease include diabetes mellitus, dyslipidemia, obesity and sedentary lifestyle. Past treatments include ACE inhibitors. Hypertensive end-organ damage includes kidney disease. Identifiable causes of hypertension include chronic renal disease.  Hyperlipidemia This is a chronic problem. The current episode started more than 1 year ago. The problem is uncontrolled. Exacerbating diseases include chronic renal disease, diabetes and obesity. Pertinent negatives include no chest pain, myalgias or shortness of breath. Current antihyperlipidemic treatment includes statins and fibric acid derivatives. Risk factors for coronary artery disease  include diabetes mellitus, dyslipidemia, obesity, a sedentary lifestyle and hypertension.    Review of Systems  Constitutional: Positive for unexpected weight change. Negative for chills, fatigue and fever.  HENT: Negative for trouble swallowing and voice change.   Eyes: Negative for blurred vision and visual disturbance.  Respiratory: Negative for cough, shortness of breath and wheezing.   Cardiovascular: Negative for chest pain, palpitations and leg swelling.  Gastrointestinal: Negative for diarrhea, nausea and vomiting.  Endocrine: Positive for polydipsia and polyuria. Negative for cold intolerance, heat intolerance and polyphagia.  Musculoskeletal: Positive for gait problem. Negative for arthralgias and myalgias.       She is now wheelchair-bound due to disequilibrium and deconditioning.  Skin: Negative for color change, pallor, rash and wound.  Neurological: Negative for seizures and headaches.  Psychiatric/Behavioral: Negative  for confusion and suicidal ideas.    Objective:    BP 109/65 (BP Location: Left Arm, Patient Position: Sitting)   Pulse 76   Ht 5\' 1"  (1.549 m)   Wt 243 lb (110.2 kg)   BMI 45.91 kg/m   Wt Readings from Last 3 Encounters:  09/19/19 243 lb (110.2 kg)  06/15/19 231 lb 9.6 oz (105.1 kg)  01/10/19 240 lb (108.9 kg)    Physical Exam Constitutional:      Appearance: She is well-developed.  HENT:     Head: Normocephalic and atraumatic.  Neck:     Thyroid: No thyromegaly.     Trachea: No tracheal deviation.  Cardiovascular:     Rate and Rhythm: Normal rate.  Pulmonary:     Effort: Pulmonary effort is normal.  Abdominal:     Tenderness: There is no abdominal tenderness. There is no guarding.  Musculoskeletal:     Cervical back: Normal range of motion and neck supple.     Comments: Wheelchair-bound.  Skin:    General: Skin is dry.     Coloration: Skin is not pale.     Findings: No erythema or rash.  Neurological:     Mental Status: She is alert  and oriented to person, place, and time.     Cranial Nerves: No cranial nerve deficit.     Coordination: Coordination normal.  Psychiatric:        Judgment: Judgment normal.     Results for orders placed or performed in visit on 09/19/19  HgB A1c  Result Value Ref Range   Hemoglobin A1C 7.7 (A) 4.0 - 5.6 %   HbA1c POC (<> result, manual entry)     HbA1c, POC (prediabetic range)     HbA1c, POC (controlled diabetic range)     Complete Blood Count (Most recent): Lab Results  Component Value Date   WBC 8.5 06/09/2018   HGB 11.9 (L) 08/25/2018   HCT 35.0 (L) 08/25/2018   MCV 97.1 06/09/2018   PLT 293 06/09/2018   Chemistry (most recent): Diabetic Labs (most recent): Lab Results  Component Value Date   HGBA1C 7.7 (A) 09/19/2019   HGBA1C 6.4 (A) 06/15/2019   HGBA1C 6.2 (A) 01/31/2019   Lipid Panel     Component Value Date/Time   CHOL 191 09/12/2019 0831   TRIG 201 (H) 09/12/2019 0831   HDL 35 (L) 09/12/2019 0831   CHOLHDL 5.5 (H) 09/12/2019 0831   VLDL 59 (H) 06/02/2018 0029   LDLCALC 124 (H) 09/12/2019 0831    Assessment & Plan:   1. Type 2 diabetes mellitus with stage 4 chronic kidney disease, with long-term current use of insulin (HCC)  -Her diabetes is  complicated by CKD, congestive heart failure,  obesity/sedentary life, and patient remains at extremely high risk for more acute and chronic complications of diabetes which include CAD, CVA, CKD, retinopathy, and neuropathy. These are all discussed in detail with the patient.  She is accompanied by her aide from nursing home with her glycemic report showing significantly above target ranges.  Her point-of-care A1c 7.7% increasing from 6.4%.     No major hypoglycemia nor hyperglycemia reported.  -  Glucose logs and insulin administration records pertaining to this visit,  to be scanned into patient's records.    - I have re-counseled the patient on diet management and weight loss  by adopting a carbohydrate  restricted / protein rich  Diet.  - she  admits there is a room for improvement in  her diet and drink choices. -  Suggestion is made for her to avoid simple carbohydrates  from her diet including Cakes, Sweet Desserts / Pastries, Ice Cream, Soda (diet and regular), Sweet Tea, Candies, Chips, Cookies, Sweet Pastries,  Store Bought Juices, Alcohol in Excess of  1-2 drinks a day, Artificial Sweeteners, Coffee Creamer, and "Sugar-free" Products. This will help patient to have stable blood glucose profile and potentially avoid unintended weight gain.  - Patient is advised to stick to a routine mealtimes to eat 3 meals  a day and avoid unnecessary snacks ( to snack only to correct hypoglycemia).  - I have approached patient with the following individualized plan to manage diabetes and patient agrees.  -Her placement in group home is the best development for her lately.  -Her presenting glycemic profile is on or near target both fasting and postprandial.   -It is better for her to remain on Humulin R U500 insulin for simplicity reasons.   -I discussed with her aide to increase Humulin U50 0 to 25 units 3 times a day with meals for premeal blood glucose readings above 90 mg/dl, associated with monitoring of blood glucose before meals and at bedtime. - She is advised to skip insulin if pre-meal blood glucose is below 90 mg/dL or if she is not eating. -She is not a candidate for metformin, SGLT2 inhibitors due to CKD.   - Patient specific target  for A1c; LDL, HDL, Triglycerides, were discussed in detail.  2) BP/HTN:  Her blood pressure is controlled to target.   She is advised to restrict salt intake and I advised her to continue current medications including lisinopril 20 mg p.o. daily, hydralazine 25 mg p.o. 3 times daily, furosemide 40 mg p.o. Daily.  3) Lipids/HPL: Her recent lipid panel showed loss of control of dyslipidemia, LDL at 124 increasing from 80.  I discussed and added Crestor 20 mg p.o.  nightly, advised to continue fenofibrate.   4)  Weight/Diet: Her BMI is 16.1 -clearly complicating her diabetes care.   She is a candidate for modest weight loss.  CDE consult in progress, exercise, and carbohydrates information provided.  5) Chronic Care/Health Maintenance:  -Patient is  on ACEI and encouraged to continue to follow up with Ophthalmology, Podiatrist at least yearly or according to recommendations, and advised to stay away from smoking. I have recommended yearly flu vaccine and pneumonia vaccination at least every 5 years;  and  sleep for at least 7 hours a day.    - I advised patient to maintain close follow up with Rosita Fire, MD for primary care needs.  - Time spent on this patient care encounter:  35 min, of which > 50% was spent in  counseling and the rest reviewing her blood glucose logs , discussing her hypoglycemia and hyperglycemia episodes, reviewing her current and  previous labs / studies  ( including abstraction from other facilities) and medications  doses and developing a  long term treatment plan and documenting her care.   Please refer to Patient Instructions for Blood Glucose Monitoring and Insulin/Medications Dosing Guide"  in media tab for additional information. Please  also refer to " Patient Self Inventory" in the Media  tab for reviewed elements of pertinent patient history.  [name] participated in the discussions, expressed understanding, and voiced agreement with the above plans.  All questions were answered to her satisfaction. she is encouraged to contact clinic should she have any questions or concerns prior to her  return visit.    Follow up plan: -Return in about 4 months (around 01/19/2020) for Bring Meter and Logs- A1c in Office.  Glade Lloyd, MD Phone: [phone]  Fax: [phone]  This note was partially dictated with voice recognition software. Similar sounding words can be transcribed inadequately or may not  be corrected upon  review.  09/19/2019, 12:56 PM

## 2019-09-19 NOTE — ED Triage Notes (Signed)
Pt from [facility]. Recently had HTN meds changed. SNF called EMS for low BP. Stable BP with EMS.   BP 104/56 in triage

## 2019-09-19 NOTE — H&P (Addendum)
History and Physical    [name] VZC:[id number] DOB: Jun 19, 1955 DOA: 09/19/2019  PCP: Rosita Fire, MD   Patient coming from: [facility]  I have personally briefly reviewed patient's old medical records in [place]  Chief Complaint: Dizziness  HPI: [name] is a 64 y.o. female with medical history significant for diabetes mellitus, hypertension, mental handicap, morbid obesity BMI 45, OSA, diastolic CHF. Patient was brought to the ED via EMS reports of low blood pressure.  Patient reported earlier today she was dizzy, with a feeling that she would pass out, but she did not lose consciousness,. Staff at nursing home reported blood pressure of 82/58 at that time. Patient denies vomiting, she reports 3 normal bowel movements today, she has maintained good p.o. intake.  No chest pain, no difficulty breathing.  Patient's antihypertensive medications were recently adjusted, lisinopril was added. She reports a cough that started today in the ED, no difficulty breathing, no chest pain, no pain with urination.  ED Course: Heart rate down to 39, gradually improved in the ED. blood pressure 101/53.  WBC 14.3.  Potassium 5.2.  Sodium 135.  Creatinine elevated 2.28.  Magnesium and troponin, within normal limits.  Chest x-ray showed chronic appearing increased lung markings without evidence of acute disease.  500 mill bolus normal saline given.  Hospitalist to admit for presyncope, symptomatic bradycardia, and acute kidney injury  Review of Systems: As per HPI all other systems reviewed and negative.  Past Medical History:  Diagnosis Date  . Arthritis   . Asthma   . Atrial fibrillation ([place])   . Chronic diastolic CHF (congestive heart failure) ([place])   . CKD (chronic kidney disease), stage III   . Diabetes mellitus type 2 in obese ([place])   . Hypertension   . Iron deficiency anemia 10/16/2010  . Mental handicap 10/16/2010  . Mild CAD 2013  . Morbid obesity ([place])   . OSA  (obstructive sleep apnea)     Past Surgical History:  Procedure Laterality Date  . ABDOMINAL HYSTERECTOMY    . AV FISTULA PLACEMENT Right 06/06/2018   Procedure: ARTERIOVENOUS (AV) FISTULA CREATION;  Surgeon: Marty Heck, MD;  Location: [facility];  Service: Vascular;  Laterality: Right;  . CESAREAN SECTION    . CHOLECYSTECTOMY    . COLONOSCOPY  08/2010   normal TI, sigmoid polyp (adenoma ). Next TCS due  08/2015,  . ESOPHAGOGASTRODUODENOSCOPY  08/2010   antral and duodenal erosions s/p bx (chronic gastritis, no h.pylori, no celiac dz ), hiatal hernia  . FISTULA SUPERFICIALIZATION Right 08/25/2018   Procedure: FISTULA SUPERFICIALIZATION RIGHT ARM;  Surgeon: Waynetta Sandy, MD;  Location: [facility];  Service: Cardiovascular;  Laterality: Right;  . IR FLUORO GUIDE CV LINE RIGHT  05/31/2018  . IR US GUIDE VASC ACCESS RIGHT  05/31/2018  . KNEE SURGERY     right knee @ 64 years of age  . LEFT AND RIGHT HEART CATHETERIZATION WITH CORONARY ANGIOGRAM N/A 09/21/2011   Procedure: LEFT AND RIGHT HEART CATHETERIZATION WITH CORONARY ANGIOGRAM;  Surgeon: Birdie Riddle, MD;  Location: [facility];  Service: Cardiovascular;  Laterality: N/A;     reports that she quit smoking about 23 years ago. Her smoking use included cigarettes. She smoked 1.00 pack per day for 0.00 years. She has never used smokeless tobacco. She reports that she does not drink alcohol and does not use drugs.  No Known Allergies  Family History  Problem Relation Age of Onset  .  Diabetes Mother   . Hypertension Mother   . Heart failure Mother   . Hypertension Father   . Atrial fibrillation Father   . Kidney disease Other        son reportedly has had cyst on kidney and lung s/p surgery at [facility]  . Hypertension Son   . Colon cancer Neg Hx     Prior to Admission medications   Medication Sig Start Date End Date Taking? Authorizing Provider  acetaminophen (TYLENOL) 325 MG tablet Take 325 mg by mouth every 12 (twelve) hours  as needed for moderate pain.   Yes [provider]  albuterol (PROVENTIL HFA;VENTOLIN HFA) 108 (90 Base) MCG/ACT inhaler Inhale 2 puffs into the lungs every 6 (six) hours as needed for shortness of breath.   Yes [provider]  apixaban (ELIQUIS) 5 MG TABS tablet Take 1 tablet (5 mg total) by mouth 2 (two) times daily. 06/09/18  Yes Dessa Phi, DO  betamethasone dipropionate (DIPROLENE) 0.05 % ointment Apply 1 application topically daily as needed (for rash under breasts).  08/04/19  Yes [provider]  cetirizine (ZYRTEC) 10 MG tablet Take 10 mg by mouth daily. 06/06/19  Yes [provider]  clotrimazole (LOTRIMIN) 1 % cream Apply 1 application topically daily as needed (under breasts as needed for rash).   Yes [provider]  clotrimazole-betamethasone (LOTRISONE) cream Apply 1 application topically 2 (two) times daily. Applied under breasts   Yes [provider]  DIABETIC TUSSIN EX 100 MG/5ML syrup Take 100 mg by mouth every 4 (four) hours as needed for cough or congestion.  06/09/19  Yes [provider]  diltiazem (CARDIZEM CD) 240 MG 24 hr capsule Take 1 capsule (240 mg total) by mouth daily. 06/10/18  Yes Dessa Phi, DO  fluticasone (FLONASE) 50 MCG/ACT nasal spray Place 2 sprays into both nostrils daily as needed for allergies or rhinitis.   Yes [provider]  Fluticasone-Umeclidin-Vilant (TRELEGY ELLIPTA) 100-62.5-25 MCG/INH AEPB Inhale 1 puff into the lungs daily.    Yes [provider]  furosemide (LASIX) 40 MG tablet Take 40 mg by mouth See admin instructions. Take one tablet by mouth TWICE DAILY ALTERNATING with taking one tablet by mouth ONCE DAILY 04/20/19  Yes [provider]  hydrALAZINE (APRESOLINE) 25 MG tablet Take 25 mg by mouth 3 (three) times daily.    Yes [provider]  insulin regular human CONCENTRATED (HUMULIN R U-500 KWIKPEN) 500 UNIT/ML kwikpen Inject 25 Units into the  skin 3 (three) times daily with meals. Patient taking differently: Inject 20 Units into the skin 3 (three) times daily with meals. When blood sugar levels are greater than 90mg /dL 09/19/19  Yes Nida, Marella Chimes, MD  ipratropium-albuterol (DUONEB) 0.5-2.5 (3) MG/3ML SOLN Take 3 mLs by nebulization every 6 (six) hours as needed (for cough).    Yes [provider]  isosorbide dinitrate (ISORDIL) 10 MG tablet Take 1 tablet (10 mg total) by mouth 3 (three) times daily. 06/09/18  Yes Dessa Phi, DO  lisinopril (ZESTRIL) 5 MG tablet Take 5 mg by mouth daily.  09/14/19 09/13/20 Yes [provider]  metolazone (ZAROXOLYN) 5 MG tablet Take 5 mg by mouth daily.    Yes [provider]  metoprolol tartrate (LOPRESSOR) 100 MG tablet Take 100 mg by mouth daily.   Yes [provider]  montelukast (SINGULAIR) 10 MG tablet Take 10 mg by mouth at bedtime.    Yes [provider]  polyethylene glycol powder (GLYCOLAX/MIRALAX)  17 GM/SCOOP powder Take 17 g by mouth daily as needed for mild constipation or moderate constipation.   Yes [provider]  potassium chloride SA (KLOR-CON) 20 MEQ tablet Take 20 mEq by mouth 2 (two) times daily. 09/11/19  Yes [provider]  triamcinolone cream (KENALOG) 0.5 % Apply 1 application topically every 12 (twelve) hours as needed (applied to bilateral legs for allergic dermatitis).    Yes [provider]  Vitamin D, Cholecalciferol, 25 MCG (1000 UT) TABS Take 1,000 Units by mouth daily.  06/06/19  Yes [provider]  hydrALAZINE (APRESOLINE) 25 MG tablet Take 1 tablet (25 mg total) by mouth every 8 (eight) hours for 30 days. Patient taking differently: Take 25 mg by mouth 3 (three) times daily.  06/09/18 10/28/18  Dessa Phi, DO  rosuvastatin (CRESTOR) 20 MG tablet Take 1 tablet (20 mg total) by mouth daily. Patient not taking: Reported on 09/19/2019 09/19/19   Cassandria Anger, MD    Physical  Exam: Vitals:   09/19/19 1900 09/19/19 2000 09/19/19 2030 09/19/19 2100  BP: (!) 164/69 (!) 166/72 (!) 149/61 (!) 154/63  Pulse: 63 62 61 60  Resp:      Temp:      TempSrc:      SpO2: 96% 95% 96% 95%  Weight:      Height:        Constitutional: NAD, calm, comfortable Vitals:   09/19/19 1900 09/19/19 2000 09/19/19 2030 09/19/19 2100  BP: (!) 164/69 (!) 166/72 (!) 149/61 (!) 154/63  Pulse: 63 62 61 60  Resp:      Temp:      TempSrc:      SpO2: 96% 95% 96% 95%  Weight:      Height:       Eyes: lids and conjunctivae normal ENMT: Mucous membranes are moist.  Neck: normal, supple, no masses, no thyromegaly Respiratory: clear to auscultation bilaterally, no wheezing, no crackles. Normal respiratory effort. No accessory muscle use.  Cardiovascular: Regular rate and rhythm, 2/6 systolic murmurs , no rubs / gallops. No extremity edema. 2+ pedal pulses.  Abdomen: no tenderness, no masses palpated. No hepatosplenomegaly. Bowel sounds positive.  Musculoskeletal: no clubbing / cyanosis. No joint deformity upper and lower extremities. Good ROM, no contractures.  Skin: no rashes, lesions, ulcers. No induration Neurologic: No apparent cranial nerve abnormality, moving all extremities spontaneously. Psychiatric: Normal judgment and insight. Alert and oriented x 3. Normal mood.   Labs on Admission: I have personally reviewed following labs and imaging studies  CBC: Recent Labs  Lab 09/19/19 1555  WBC 14.8*  NEUTROABS 11.9*  HGB 10.9*  HCT 33.9*  MCV 94.4  PLT 350   Basic Metabolic Panel: Recent Labs  Lab 09/19/19 1555  NA 135  K 5.2*  CL 100  CO2 24  GLUCOSE 265*  BUN 76*  CREATININE 2.28*  CALCIUM 9.2  MG 2.0   Liver Function Tests: Recent Labs  Lab 09/19/19 1555  AST 24  ALT 23  ALKPHOS 87  BILITOT 0.4  PROT 7.3  ALBUMIN 3.5   HbA1C: Recent Labs    09/19/19 1008  HGBA1C 7.7*   Thyroid Function Tests: Recent Labs    09/19/19 1555  TSH 2.446  Urine  analysis:    Component Value Date/Time   COLORURINE YELLOW 09/19/2019 1545   APPEARANCEUR HAZY (A) 09/19/2019 1545   LABSPEC 1.010 09/19/2019 1545   PHURINE 5.0 09/19/2019 1545   GLUCOSEU NEGATIVE 09/19/2019 1545   HGBUR SMALL (  A) 09/19/2019 [place] 09/19/2019 [place] 09/19/2019 1545   PROTEINUR NEGATIVE 09/19/2019 1545   UROBILINOGEN 0.2 02/01/2013 1145   NITRITE NEGATIVE 09/19/2019 1545   LEUKOCYTESUR TRACE (A) 09/19/2019 1545    Radiological Exams on Admission: DG Chest Portable 1 View  Result Date: 09/19/2019 CLINICAL DATA:  Low blood pressure. EXAM: PORTABLE CHEST 1 VIEW COMPARISON:  May 26, 2018 FINDINGS: Mild, diffuse chronic appearing increased lung markings are seen without evidence of acute infiltrate, pleural effusion or pneumothorax. The cardiac silhouette is moderately enlarged and unchanged in size. The visualized skeletal structures are unremarkable. IMPRESSION: Chronic appearing increased lung markings without evidence of acute or active cardiopulmonary disease. Electronically Signed   By: Virgina Norfolk M.D.   On: 09/19/2019 16:44    EKG: Independently reviewed.  Junctional bradycardia rate 39, QTc 406.  No significant changes compared to prior EKG.  Assessment/Plan Active Problems:   Symptomatic bradycardia   Symptomatic bradycardia, presyncope- with hypotension systolic down to 82, heart rate 39, now improved in the 60s.  Likely due to medications.  Magnesium, TSH, troponin all within normal limits.  Potassium mildly elevated at 5.2.  Last echo 04/2018 EF 60 to 65%. -Cardizem 240 mg daily, metoprolol 100 mg daily held for now. -Metolazone, lisinopril, Imdur, hydralazine, Lasix held for now with reported hypotension.  Acute kidney injury on chronic renal disease stage III-creatinine 2.28, baseline 1.4-1.5.  Prerenal from medications versus ATN from hypotension.  Patient has required dialysis in the past, currently she is  making good amount of urine. -500 mill bolus given, continue N/s 75cc/hr x 20 hrs -BMP a.m. -Renal ultrasound -Hold home lisinopril, Lasix and metolazone -Hold home potassium supplements  Leukocytosis-WBC 14.8.  Denies urinary symptoms, UA with trace leukocytes, onset of cough today, chest x-ray without acute disease.  At this time no symptoms referrable to infectious etiology. -CBC in the morning  Diabetes mellitus-random glucose 265.  A1c today 7.7. - SSI- M -Hold home regular insulin 25 units 3 times daily with meals.  Atrial fibrillation-presenting with sinus bradycardia, also on anticoagulation with Eliquis. -Eliquis held for now due to worsening renal insufficiency, resume pending renal function -Hold home rate limiting medications Cardizem and metoprolol for now, will likely need adjustment of doses prior to discharge.  Diastolic CHF-stable and compensated.  Last echo 04/2018 EF 60 to 65%. -Hold diuretics, beta-blockers for now.  Morbid obesity-BMI 45.  DVT prophylaxis: Heparin, resume Eliquis when renal function improves. Code Status: Full code Family Communication: None at bedside Disposition Plan: 1 to 2 days Consults called: None Admission status: Observation, telemetry   Bethena Roys MD [facility]  09/19/2019, 10:04 PM

## 2019-09-19 NOTE — ED Notes (Signed)
ED TO INPATIENT HANDOFF REPORT  ED Nurse Name and Phone #: [phone]  S Name/Age/Gender [name] 64 y.o. female Room/Bed: APA18/APA18  Code Status   Code Status: Prior  Home/SNF/Other Skilled nursing facility Patient oriented to: self, place, time and situation Is this baseline? Yes   Triage Complete: Triage complete  Chief Complaint Symptomatic bradycardia [R00.1]  Triage Note Pt from [facility]. Recently had HTN meds changed. SNF called EMS for low BP. Stable BP with EMS.   BP 104/56 in triage       Allergies No Known Allergies  Level of Care/Admitting Diagnosis ED Disposition    ED Disposition Condition [facility] Area: [facility] [[id number]]  Level of Care: Telemetry [5]  Covid Evaluation: Confirmed COVID Negative  Diagnosis: Symptomatic bradycardia [025427]  Admitting Physician: Bethena Roys [[id number]]  Attending Physician: Bethena Roys [email or web address]       B Medical/Surgery History Past Medical History:  Diagnosis Date  . Arthritis   . Asthma   . Atrial fibrillation ([place])   . Chronic diastolic CHF (congestive heart failure) ([place])   . CKD (chronic kidney disease), stage III   . Diabetes mellitus type 2 in obese ([place])   . Hypertension   . Iron deficiency anemia 10/16/2010  . Mental handicap 10/16/2010  . Mild CAD 2013  . Morbid obesity ([place])   . OSA (obstructive sleep apnea)    Past Surgical History:  Procedure Laterality Date  . ABDOMINAL HYSTERECTOMY    . AV FISTULA PLACEMENT Right 06/06/2018   Procedure: ARTERIOVENOUS (AV) FISTULA CREATION;  Surgeon: Marty Heck, MD;  Location: [facility];  Service: Vascular;  Laterality: Right;  . CESAREAN SECTION    . CHOLECYSTECTOMY    . COLONOSCOPY  08/2010   normal TI, sigmoid polyp (adenoma ). Next TCS due  08/2015,  . ESOPHAGOGASTRODUODENOSCOPY  08/2010   antral and duodenal erosions s/p bx (chronic gastritis, no h.pylori, no celiac dz ), hiatal hernia  .  FISTULA SUPERFICIALIZATION Right 08/25/2018   Procedure: FISTULA SUPERFICIALIZATION RIGHT ARM;  Surgeon: Waynetta Sandy, MD;  Location: [facility];  Service: Cardiovascular;  Laterality: Right;  . IR FLUORO GUIDE CV LINE RIGHT  05/31/2018  . IR US GUIDE VASC ACCESS RIGHT  05/31/2018  . KNEE SURGERY     right knee @ 64 years of age  . LEFT AND RIGHT HEART CATHETERIZATION WITH CORONARY ANGIOGRAM N/A 09/21/2011   Procedure: LEFT AND RIGHT HEART CATHETERIZATION WITH CORONARY ANGIOGRAM;  Surgeon: Birdie Riddle, MD;  Location: [facility];  Service: Cardiovascular;  Laterality: N/A;     A IV Location/Drains/Wounds Patient Lines/Drains/Airways Status    Active Line/Drains/Airways    Name Placement date Placement time Site Days   Peripheral IV 09/19/19 Left Hand 09/19/19  1554  Hand  less than 1   Fistula / Graft Right Upper arm Arteriovenous fistula 06/06/18  1630  Upper arm  470   Fistula / Graft Right Upper arm Arteriovenous fistula 08/25/18  1021  Upper arm  390   Hemodialysis Catheter Right Internal jugular Double-lumen;Permanent 05/31/18  1411  Internal jugular  476   Incision (Closed) 08/25/18 Arm Right 08/25/18  1040   390          Intake/Output Last 24 hours  Intake/Output Summary (Last 24 hours) at 09/19/2019 2054 Last data filed at 09/19/2019 1831 Gross per 24 hour  Intake 500 ml  Output --  Net 500 ml    Labs/Imaging  Results for orders placed or performed during the hospital encounter of 09/19/19 (from the past 48 hour(s))  Urinalysis, Routine w reflex microscopic     Status: Abnormal   Collection Time: 09/19/19  3:45 PM  Result Value Ref Range   Color, Urine YELLOW YELLOW   APPearance HAZY (A) CLEAR   Specific Gravity, Urine 1.010 1.005 - 1.030   pH 5.0 5.0 - 8.0   Glucose, UA NEGATIVE NEGATIVE mg/dL   Hgb urine dipstick SMALL (A) NEGATIVE   Bilirubin Urine NEGATIVE NEGATIVE   Ketones, ur NEGATIVE NEGATIVE mg/dL   Protein, ur NEGATIVE NEGATIVE mg/dL   Nitrite  NEGATIVE NEGATIVE   Leukocytes,Ua TRACE (A) NEGATIVE   RBC / HPF 0-5 0 - 5 RBC/hpf   WBC, UA 0-5 0 - 5 WBC/hpf   Bacteria, UA NONE SEEN NONE SEEN   Squamous Epithelial / LPF 0-5 0 - 5   Hyaline Casts, UA PRESENT     Comment: Performed at [facility], [address] [place], [place] [address]  SARS Coronavirus 2 by RT PCR (hospital order, performed in [place] hospital lab) Nasopharyngeal Nasopharyngeal Swab     Status: None   Collection Time: 09/19/19  3:49 PM   Specimen: Nasopharyngeal Swab  Result Value Ref Range   SARS Coronavirus 2 NEGATIVE NEGATIVE    Comment: (NOTE) SARS-CoV-2 target nucleic acids are NOT DETECTED.  The SARS-CoV-2 RNA is generally detectable in upper and lower respiratory specimens during the acute phase of infection. The lowest concentration of SARS-CoV-2 viral copies this assay can detect is 250 copies / mL. A negative result does not preclude SARS-CoV-2 infection and should not be used as the sole basis for treatment or other patient management decisions.  A negative result may occur with improper specimen collection / handling, submission of specimen other than nasopharyngeal swab, presence of viral mutation(s) within the areas targeted by this assay, and inadequate number of viral copies (<250 copies / mL). A negative result must be combined with clinical observations, patient history, and epidemiological information.  Fact Sheet for Patients:   StrictlyIdeas.no  Fact Sheet for Healthcare Providers: BankingDealers.co.za  This test is not yet approved or  cleared by the [place] FDA and has been authorized for detection and/or diagnosis of SARS-CoV-2 by FDA under an Emergency Use Authorization (EUA).  This EUA will remain in effect (meaning this test can be used) for the duration of the COVID-19 declaration under Section 564(b)(1) of the Act, 21 U.S.C. section 360bbb-3(b)(1), unless the  authorization is terminated or revoked sooner.  Performed at [facility], [address] [place], [place] [address]   Comprehensive metabolic panel     Status: Abnormal   Collection Time: 09/19/19  3:55 PM  Result Value Ref Range   Sodium 135 135 - 145 mmol/L   Potassium 5.2 (H) 3.5 - 5.1 mmol/L   Chloride 100 98 - 111 mmol/L   CO2 24 22 - 32 mmol/L   Glucose, Bld 265 (H) 70 - 99 mg/dL    Comment: Glucose reference range applies only to samples taken after fasting for at least 8 hours.   BUN 76 (H) 8 - 23 mg/dL   Creatinine, Ser 2.28 (H) 0.44 - 1.00 mg/dL   Calcium 9.2 8.9 - 10.3 mg/dL   Total Protein 7.3 6.5 - 8.1 g/dL   Albumin 3.5 3.5 - 5.0 g/dL   AST 24 15 - 41 U/L   ALT 23 0 - 44 U/L   Alkaline Phosphatase 87  38 - 126 U/L   Total Bilirubin 0.4 0.3 - 1.2 mg/dL   GFR calc non Af Amer 22 (L) >60 mL/min   GFR calc Af Amer 26 (L) >60 mL/min   Anion gap 11 5 - 15    Comment: Performed at [facility], [address] [place], [place] [address]  CBC with Differential     Status: Abnormal   Collection Time: 09/19/19  3:55 PM  Result Value Ref Range   WBC 14.8 (H) 4.0 - 10.5 K/uL   RBC 3.59 (L) 3.87 - 5.11 MIL/uL   Hemoglobin 10.9 (L) 12.0 - 15.0 g/dL   HCT 33.9 (L) 36 - 46 %   MCV 94.4 80.0 - 100.0 fL   MCH 30.4 26.0 - 34.0 pg   MCHC 32.2 30.0 - 36.0 g/dL   RDW 14.5 11.5 - 15.5 %   Platelets 353 150 - 400 K/uL   nRBC 0.0 0.0 - 0.2 %   Neutrophils Relative % 80 %   Neutro Abs 11.9 (H) 1.7 - 7.7 K/uL   Lymphocytes Relative 11 %   Lymphs Abs 1.7 0.7 - 4.0 K/uL   Monocytes Relative 6 %   Monocytes Absolute 0.9 0 - 1 K/uL   Eosinophils Relative 1 %   Eosinophils Absolute 0.1 0 - 0 K/uL   Basophils Relative 1 %   Basophils Absolute 0.1 0 - 0 K/uL   Immature Granulocytes 1 %   Abs Immature Granulocytes 0.13 (H) 0.00 - 0.07 K/uL    Comment: Performed at [facility], [address], [place], [place] [address]  Magnesium     Status: None   Collection Time: 09/19/19  3:55 PM   Result Value Ref Range   Magnesium 2.0 1.7 - 2.4 mg/dL    Comment: Performed at [facility], [address], [place], [place] [address]  Troponin I (High Sensitivity)     Status: None   Collection Time: 09/19/19  3:55 PM  Result Value Ref Range   Troponin I (High Sensitivity) 9 <18 ng/L    Comment: (NOTE) Elevated high sensitivity troponin I (hsTnI) values and significant  changes across serial measurements may suggest ACS but many other  chronic and acute conditions are known to elevate hsTnI results.  Refer to the "Links" section for chest pain algorithms and additional  guidance. Performed at [facility], [address], [place], [place] [address]   Troponin I (High Sensitivity)     Status: None   Collection Time: 09/19/19  6:07 PM  Result Value Ref Range   Troponin I (High Sensitivity) 4 <18 ng/L    Comment: (NOTE) Elevated high sensitivity troponin I (hsTnI) values and significant  changes across serial measurements may suggest ACS but many other  chronic and acute conditions are known to elevate hsTnI results.  Refer to the "Links" section for chest pain algorithms and additional  guidance. Performed at [facility], [address], [place], [place] [address]    DG Chest Portable 1 View  Result Date: 09/19/2019 CLINICAL DATA:  Low blood pressure. EXAM: PORTABLE CHEST 1 VIEW COMPARISON:  May 26, 2018 FINDINGS: Mild, diffuse chronic appearing increased lung markings are seen without evidence of acute infiltrate, pleural effusion or pneumothorax. The cardiac silhouette is moderately enlarged and unchanged in size. The visualized skeletal structures are unremarkable. IMPRESSION: Chronic appearing increased lung markings without evidence of acute or active cardiopulmonary disease. Electronically Signed   By: Virgina Norfolk M.D.   On: 09/19/2019 16:44  Pending Labs Unresulted Labs (From admission, onward) Comment          Start     Ordered   09/19/19 1549  TSH  ONCE -  STAT,   STAT        09/19/19 1549          Vitals/Pain Today's Vitals   09/19/19 1830 09/19/19 1900 09/19/19 2000 09/19/19 2030  BP: (!) 163/73 (!) 164/69 (!) 166/72 (!) 149/61  Pulse: 61 63 62 61  Resp:      Temp:      TempSrc:      SpO2: 96% 96% 95% 96%  Weight:      Height:      PainSc:        Isolation Precautions No active isolations  Medications Medications  sodium chloride 0.9 % bolus 500 mL (0 mLs Intravenous Stopped 09/19/19 1831)    Mobility walks with device Low fall risk   Focused Assessments   R Recommendations: See Admitting Provider Note  Report given to:   Additional Notes:

## 2019-09-19 NOTE — ED Provider Notes (Signed)
[facility] EMERGENCY DEPARTMENT Provider Note   CSN: [id number] Arrival date & time: 09/19/19  1513     History Chief Complaint  Patient presents with  . Hypotension    [name] is a 64 y.o. female with pertinent past medical history of A. fib, CHF, CKD on dialysis, diabetes, hypertension, mental handicap with a legal guardian that presents the emergency department today for presyncope.  Patient states that she was at [facility], recently had her hypertension meds changed and staff called EMS for low blood pressure.  Patient states that she felt dizzy and sweaty, felt like she was going to pass out but did not.  States this only lasted for couple seconds.  Denies room spinning sensation.  States that she was in normal health before this.  Denies any fevers, URI-like symptoms.  Denies any vision changes, chest pain, palpitations, shortness of breath, back pain, vomiting.  States that she did eat breakfast and drink coffee this morning.  Denies any symptoms currently.  States that dizziness has resolved.  Denies any headache, paresthesias, weakness.  Per chart review patient is on multiple blood pressure medications including metoprolol, furosemide, diltiazem, and was just placed on lisinopril to 5 mg started on 6/17  Was able to speak to SNF, Tammy who is the [employer].  States that blood pressure was 82/58 this morning, states that she started feeling dizzy when she woke up and progressed throughout the day therefore they called EMS.  Tammy also states that they called her legal guardian who stated for her to come to the emergency department.  HPI     Past Medical History:  Diagnosis Date  . Arthritis   . Asthma   . Atrial fibrillation ([place])   . Chronic diastolic CHF (congestive heart failure) ([place])   . CKD (chronic kidney disease), stage III   . Diabetes mellitus type 2 in obese ([place])   . Hypertension   . Iron deficiency anemia 10/16/2010  . Mental handicap 10/16/2010  .  Mild CAD 2013  . Morbid obesity ([place])   . OSA (obstructive sleep apnea)     Patient Active Problem List   Diagnosis Date Noted  . Symptomatic bradycardia 09/19/2019  . ESRD on dialysis ([place]) 08/23/2018  . Carcinoid tumor determined by biopsy of lung   . Aortic valve mass   . Respiratory arrest ([place]) 05/21/2018  . Acute renal failure (ARF) ([place]) 05/21/2018  . CAP (community acquired pneumonia) 05/13/2018  . Mixed hyperlipidemia 11/23/2017  . Hypercortisolemia 04/27/2016  . Morbid obesity due to excess calories ([place]) 07/02/2015  . Type 2 diabetes mellitus with stage 4 chronic kidney disease, with long-term current use of insulin ([place]) 02/26/2015  . Acute kidney injury superimposed on chronic kidney disease ([place]) 10/07/2011  . Cutaneous candidiasis 10/21/2010  . Essential hypertension, benign 10/21/2010  . Iron deficiency anemia 10/16/2010  . Mental handicap 10/16/2010    Past Surgical History:  Procedure Laterality Date  . ABDOMINAL HYSTERECTOMY    . AV FISTULA PLACEMENT Right 06/06/2018   Procedure: ARTERIOVENOUS (AV) FISTULA CREATION;  Surgeon: Marty Heck, MD;  Location: [place];  Service: Vascular;  Laterality: Right;  . CESAREAN SECTION    . CHOLECYSTECTOMY    . COLONOSCOPY  08/2010   normal TI, sigmoid polyp (adenoma ). Next TCS due  08/2015,  . ESOPHAGOGASTRODUODENOSCOPY  08/2010   antral and duodenal erosions s/p bx (chronic gastritis, no h.pylori, no celiac dz ), hiatal hernia  . FISTULA SUPERFICIALIZATION Right 08/25/2018  Procedure: FISTULA SUPERFICIALIZATION RIGHT ARM;  Surgeon: Waynetta Sandy, MD;  Location: [place];  Service: Cardiovascular;  Laterality: Right;  . IR FLUORO GUIDE CV LINE RIGHT  05/31/2018  . IR US GUIDE VASC ACCESS RIGHT  05/31/2018  . KNEE SURGERY     right knee @ 64 years of age  . LEFT AND RIGHT HEART CATHETERIZATION WITH CORONARY ANGIOGRAM N/A 09/21/2011   Procedure: LEFT AND RIGHT HEART CATHETERIZATION WITH CORONARY ANGIOGRAM;   Surgeon: Birdie Riddle, MD;  Location: [place] CATH LAB;  Service: Cardiovascular;  Laterality: N/A;     OB History   No obstetric history on file.     Family History  Problem Relation Age of Onset  . Diabetes Mother   . Hypertension Mother   . Heart failure Mother   . Hypertension Father   . Atrial fibrillation Father   . Kidney disease Other        son reportedly has had cyst on kidney and lung s/p surgery at [facility]  . Hypertension Son   . Colon cancer Neg Hx     Social History   Tobacco Use  . Smoking status: Former Smoker    Packs/day: 1.00    Years: 0.00    Pack years: 0.00    Types: Cigarettes    Quit date: 04/11/1996    Years since quitting: 23.4  . Smokeless tobacco: Never Used  . Tobacco comment: quit a couple year ago  Vaping Use  . Vaping Use: Never used  Substance Use Topics  . Alcohol use: No  . Drug use: No    Home Medications Prior to Admission medications   Medication Sig Start Date End Date Taking? Authorizing Provider  ACCU-CHEK AVIVA PLUS test strip USE TO TEST THREE TIMES DAILY. 02/23/18   Cassandria Anger, MD  ACCU-CHEK SOFTCLIX LANCETS lancets USE TO TEST THREE TIMES DAILY. 02/23/18   Cassandria Anger, MD  acetaminophen (TYLENOL) 325 MG tablet Take 325 mg by mouth every 12 (twelve) hours as needed for moderate pain.    [provider]  albuterol (PROVENTIL HFA;VENTOLIN HFA) 108 (90 Base) MCG/ACT inhaler Inhale 2 puffs into the lungs every 6 (six) hours as needed for shortness of breath.    [provider]  apixaban (ELIQUIS) 5 MG TABS tablet Take 1 tablet (5 mg total) by mouth 2 (two) times daily. 06/09/18   Dessa Phi, DO  betamethasone dipropionate (DIPROLENE) 0.05 % ointment  08/04/19   [provider]  betamethasone dipropionate 0.05 % cream Apply topically.    [provider]  cetirizine (ZYRTEC) 10 MG tablet Take 10 mg by mouth daily. 06/06/19   [provider]  CLOTRIMAZOLE ANTI-FUNGAL EX  Apply 1 % topically.    [provider]  clotrimazole-betamethasone (LOTRISONE) cream Apply topically.    [provider]  DIABETIC TUSSIN EX 100 MG/5ML syrup SMARTSIG:5 Milliliter(s) By Mouth Every 4 Hours PRN 06/09/19   [provider]  diltiazem (CARDIZEM CD) 240 MG 24 hr capsule Take 1 capsule (240 mg total) by mouth daily. 06/10/18   Dessa Phi, DO  fluticasone (FLONASE) 50 MCG/ACT nasal spray Place 2 sprays into both nostrils daily as needed for allergies or rhinitis.    [provider]  Fluticasone-Umeclidin-Vilant (TRELEGY ELLIPTA) 100-62.5-25 MCG/INH AEPB Inhale 1 puff into the lungs daily.     [provider]  furosemide (LASIX) 40 MG tablet Take 1 tablet (40 mg total) by mouth daily. Patient not taking: Reported on 09/19/2019 01/10/19  Nat Christen, MD  furosemide (LASIX) 40 MG tablet TAKE 1 TABLET BY MOUTH TWICE A DAY ALTERNATING WITH 40 MG ONCE DAILY DOSE 04/20/19   [provider]  guaiFENesin (ROBITUSSIN) 100 MG/5ML SOLN Take 5 mLs (100 mg total) by mouth every 4 (four) hours as needed for cough or to loosen phlegm. 05/16/18   Manuella Ghazi, Pratik D, DO  hydrALAZINE (APRESOLINE) 25 MG tablet Take 1 tablet (25 mg total) by mouth every 8 (eight) hours for 30 days. Patient taking differently: Take 25 mg by mouth 3 (three) times daily.  06/09/18 10/28/18  Dessa Phi, DO  hydrALAZINE (APRESOLINE) 25 MG tablet Take by mouth.    [provider]  insulin regular human CONCENTRATED (HUMULIN R U-500 KWIKPEN) 500 UNIT/ML kwikpen Inject 25 Units into the skin 3 (three) times daily with meals. 09/19/19   Cassandria Anger, MD  ipratropium-albuterol (DUONEB) 0.5-2.5 (3) MG/3ML SOLN Take 3 mLs by nebulization every 6 (six) hours as needed (shortness of breath).    [provider]  isosorbide dinitrate (ISORDIL) 10 MG tablet Take 1 tablet (10 mg total) by mouth 3 (three) times daily. 06/09/18   Dessa Phi, DO  lisinopril (ZESTRIL)  5 MG tablet Take by mouth. 09/14/19 09/13/20  [provider]  loratadine (CLARITIN) 10 MG tablet Take 10 mg by mouth daily. Patient not taking: Reported on 09/19/2019    [provider]  metolazone (ZAROXOLYN) 5 MG tablet Take by mouth.    [provider]  metoprolol tartrate (LOPRESSOR) 100 MG tablet Take 100 mg by mouth daily.    [provider]  montelukast (SINGULAIR) 10 MG tablet Take by mouth.    [provider]  NOVOFINE AUTOCOVER 30G X 8 MM MISC USE AS DIRECTED FOR 3 TIMES DAILY INSULIN ADMINISTRATION. 04/04/18   Cassandria Anger, MD  oxyCODONE-acetaminophen (PERCOCET/ROXICET) 5-325 MG tablet Take 1 tablet by mouth every 6 (six) hours as needed. Patient not taking: Reported on 09/19/2019 08/25/18   Ulyses Amor, PA-C  potassium chloride SA (KLOR-CON) 20 MEQ tablet Take 20 mEq by mouth 2 (two) times daily. 09/11/19   [provider]  rosuvastatin (CRESTOR) 20 MG tablet Take 1 tablet (20 mg total) by mouth daily. 09/19/19   Cassandria Anger, MD  SURE COMFORT PEN NEEDLES 31G X 8 MM MISC USE AS DIRECTED WITH LEVEMIR AND NOVOLOG. UP TO FOUR TIMES DAILY. 02/12/17   Cassandria Anger, MD  triamcinolone cream (KENALOG) 0.5 % Apply 1 application topically every 12 (twelve) hours as needed (allergic reaction).    [provider]  Vitamin D, Cholecalciferol, 25 MCG (1000 UT) TABS Take 1 tablet by mouth daily. 06/06/19   [provider]    Allergies    Patient has no known allergies.  Review of Systems   Review of Systems  Constitutional: Negative for chills, diaphoresis, fatigue and fever.  HENT: Negative for congestion, sore throat and trouble swallowing.   Eyes: Negative for pain and visual disturbance.  Respiratory: Negative for cough, shortness of breath and wheezing.   Cardiovascular: Negative for chest pain, palpitations and leg swelling.  Gastrointestinal: Negative for abdominal distention, abdominal pain,  diarrhea, nausea and vomiting.  Genitourinary: Negative for difficulty urinating.  Musculoskeletal: Negative for back pain, neck pain and neck stiffness.  Skin: Negative for pallor.  Neurological: Positive for dizziness and syncope (Pre syncope). Negative for seizures, speech difficulty, weakness, light-headedness, numbness and headaches.  Psychiatric/Behavioral: Negative for confusion.    Physical Exam Updated Vital Signs  BP (!) 163/73   Pulse 61   Temp 98.5 F (36.9 C) (Oral)   Resp 20   Ht 5\' 1"  (1.549 m)   Wt 110.2 kg   SpO2 96%   BMI 45.91 kg/m   Physical Exam Constitutional:      General: She is not in acute distress.    Appearance: Normal appearance. She is not ill-appearing, toxic-appearing or diaphoretic.  HENT:     Mouth/Throat:     Mouth: Mucous membranes are moist.     Pharynx: Oropharynx is clear.  Eyes:     General: No scleral icterus.    Extraocular Movements: Extraocular movements intact.     Pupils: Pupils are equal, round, and reactive to light.  Cardiovascular:     Rate and Rhythm: Regular rhythm. Bradycardia present.     Pulses: Normal pulses.     Heart sounds: Normal heart sounds.  Pulmonary:     Effort: Pulmonary effort is normal. No respiratory distress.     Breath sounds: Normal breath sounds. No stridor. No wheezing, rhonchi or rales.  Chest:     Chest wall: No tenderness.  Abdominal:     General: Abdomen is flat. There is no distension.     Palpations: Abdomen is soft.     Tenderness: There is no abdominal tenderness. There is no guarding or rebound.  Musculoskeletal:        General: No swelling or tenderness. Normal range of motion.     Cervical back: Normal range of motion and neck supple. No rigidity.     Right lower leg: No edema.     Left lower leg: No edema.  Skin:    General: Skin is warm and dry.     Capillary Refill: Capillary refill takes less than 2 seconds.     Coloration: Skin is not pale.  Neurological:     General: No  focal deficit present.     Mental Status: She is alert and oriented to person, place, and time. Mental status is at baseline.     Cranial Nerves: No cranial nerve deficit.     Sensory: No sensory deficit.     Motor: No weakness.     Coordination: Coordination normal.     Comments: Alert and oriented times three. Clear speech. No facial droop. CNIII-XII grossly intact. Bilateral upper and lower extremities' sensation grossly intact. 5/5 symmetric strength with grip strength and with plantar and dorsi flexion bilaterally. Patellar DTRs are 2+ and symmetric . Normal finger to nose bilaterally. Negative pronator drift.  Psychiatric:        Mood and Affect: Mood normal.        Behavior: Behavior normal.     ED Results / Procedures / Treatments   Labs (all labs ordered are listed, but only abnormal results are displayed) Labs Reviewed  COMPREHENSIVE METABOLIC PANEL - Abnormal; Notable for the following components:      Result Value   Potassium 5.2 (*)    Glucose, Bld 265 (*)    BUN 76 (*)    Creatinine, Ser 2.28 (*)    GFR calc non Af Amer 22 (*)    GFR calc Af Amer 26 (*)    All other components within normal limits  CBC WITH DIFFERENTIAL/PLATELET - Abnormal; Notable for the following components:   WBC 14.8 (*)    RBC 3.59 (*)    Hemoglobin 10.9 (*)    HCT 33.9 (*)    Neutro Abs 11.9 (*)  Abs Immature Granulocytes 0.13 (*)    All other components within normal limits  SARS CORONAVIRUS 2 BY RT PCR (HOSPITAL ORDER, [place] [facility])  MAGNESIUM  URINALYSIS, ROUTINE W REFLEX MICROSCOPIC  TSH  TROPONIN I (HIGH SENSITIVITY)  TROPONIN I (HIGH SENSITIVITY)    EKG EKG Interpretation  Date/Time:  Tuesday September 19 2019 15:45:23 EDT Ventricular Rate:  39 PR Interval:    QRS Duration: 104 QT Interval:  503 QTC Calculation: 406 R Axis:   49 Text Interpretation: Junctional bradycardia Low voltage, precordial leads No STEMI Confirmed by Nanda Quinton [phone]) on  09/19/2019 3:50:15 PM   Radiology DG Chest Portable 1 View  Result Date: 09/19/2019 CLINICAL DATA:  Low blood pressure. EXAM: PORTABLE CHEST 1 VIEW COMPARISON:  May 26, 2018 FINDINGS: Mild, diffuse chronic appearing increased lung markings are seen without evidence of acute infiltrate, pleural effusion or pneumothorax. The cardiac silhouette is moderately enlarged and unchanged in size. The visualized skeletal structures are unremarkable. IMPRESSION: Chronic appearing increased lung markings without evidence of acute or active cardiopulmonary disease. Electronically Signed   By: Virgina Norfolk M.D.   On: 09/19/2019 16:44    Procedures Procedures (including critical care time)  Medications Ordered in ED Medications  sodium chloride 0.9 % bolus 500 mL (0 mLs Intravenous Stopped 09/19/19 1831)    ED Course  I have reviewed the triage vital signs and the nursing notes.  Pertinent labs & imaging results that were available during my care of the patient were reviewed by me and considered in my medical decision making (see chart for details).  Clinical Course as of Sep 19 1843  Tue Sep 19, 2019  1554 Pt brady to 37.  Blood pressure 109/60, patient is mentating well.  Denies any chest pain or shortness of breath.   [SP]    Clinical Course User Index [SP] Alfredia Client, PA-C   MDM Rules/Calculators/A&P                         RICKIA FREEBURG is a 64 y.o. female with pertinent past medical history of A. fib, CHF, CKD on dialysis, diabetes, hypertension, mental handicap with a legal guardian that presents the emergency department today for presyncope.  SNF was able to mention to me that her blood pressure this morning was 82/50.  Recent medication change with addition of lisinopril.  Patient is already on 3 different types of blood pressure medications.  Is bradycardic to 41 currently.  Is in sinus rhythm.  Will obtain labs and reassess.  Patient is currently asymptomatic.  Upon  reassessment patient's bradycardia has slowly improved, ranging in the 50s.  Blood pressure is also improved.  Patient still asymptomatic, resting comfortably in bed.  CMP with 5.2 potassium, creatinine of 2.28, this is elevated from last time.  Patient does have AKI.  BUN also elevated.  CBC with leukocytosis of 14.8, hemoglobin is 10.9 this is patient's baseline.  Negative troponins.  Did speak to Dr. Jacelyn Grip about patient who thinks that we should admit for observation overnight due to patient's symptomatic bradycardia.  Headache this is most likely due to patient's change in medications.  Will consult hospitalist at this time.  644 discussed case with hospitalist, Dr. Denton Brick who agrees to accept care of patient.  Patient with pulse of 59, blood pressure is elevated to 163/73.  Patient is still asymptomatic.  The patient appears reasonably stabilized for admission considering the current resources, flow, and  capabilities available in the ED at this time, and I doubt any other [facility] requiring further screening and/or treatment in the ED prior to admission.  I discussed this case with my attending physician who cosigned this note including patient's presenting symptoms, physical exam, and planned diagnostics and interventions. Attending physician stated agreement with plan or made changes to plan which were implemented.   Attending physician assessed patient at bedside.  Final Clinical Impression(s) / ED Diagnoses Final diagnoses:  Bradycardia    Rx / DC Orders ED Discharge Orders    None       Alfredia Client, PA-C 09/19/19 1851    Long, Wonda [facility], MD 09/20/19 [phone]

## 2019-09-19 NOTE — Patient Instructions (Signed)

## 2019-09-20 ENCOUNTER — Observation Stay (HOSPITAL_COMMUNITY): Payer: Medicare Other

## 2019-09-20 DIAGNOSIS — I5032 Chronic diastolic (congestive) heart failure: Secondary | ICD-10-CM

## 2019-09-20 DIAGNOSIS — N189 Chronic kidney disease, unspecified: Secondary | ICD-10-CM

## 2019-09-20 DIAGNOSIS — N179 Acute kidney failure, unspecified: Secondary | ICD-10-CM

## 2019-09-20 DIAGNOSIS — R001 Bradycardia, unspecified: Secondary | ICD-10-CM | POA: Diagnosis not present

## 2019-09-20 LAB — CBC
HCT: 31.5 % — ABNORMAL LOW (ref 36.0–46.0)
Hemoglobin: 10.1 g/dL — ABNORMAL LOW (ref 12.0–15.0)
MCH: 29.6 pg (ref 26.0–34.0)
MCHC: 32.1 g/dL (ref 30.0–36.0)
MCV: 92.4 fL (ref 80.0–100.0)
Platelets: 228 10*3/uL (ref 150–400)
RBC: 3.41 MIL/uL — ABNORMAL LOW (ref 3.87–5.11)
RDW: 14 % (ref 11.5–15.5)
WBC: 8.9 10*3/uL (ref 4.0–10.5)
nRBC: 0 % (ref 0.0–0.2)

## 2019-09-20 LAB — BASIC METABOLIC PANEL
Anion gap: 11 (ref 5–15)
BUN: 63 mg/dL — ABNORMAL HIGH (ref 8–23)
CO2: 24 mmol/L (ref 22–32)
Calcium: 9 mg/dL (ref 8.9–10.3)
Chloride: 103 mmol/L (ref 98–111)
Creatinine, Ser: 1.64 mg/dL — ABNORMAL HIGH (ref 0.44–1.00)
GFR calc Af Amer: 38 mL/min — ABNORMAL LOW (ref >60)
GFR calc non Af Amer: 33 mL/min — ABNORMAL LOW (ref >60)
Glucose, Bld: 176 mg/dL — ABNORMAL HIGH (ref 70–99)
Potassium: 3.8 mmol/L (ref 3.5–5.1)
Sodium: 138 mmol/L (ref 135–145)

## 2019-09-20 LAB — GLUCOSE, CAPILLARY
Glucose-Capillary: 278 mg/dL — ABNORMAL HIGH (ref 70–99)
Glucose-Capillary: 224 mg/dL — ABNORMAL HIGH (ref 70–99)
Glucose-Capillary: 239 mg/dL — ABNORMAL HIGH (ref 70–99)

## 2019-09-20 LAB — HIV ANTIBODY (ROUTINE TESTING W REFLEX): HIV Screen 4th Generation wRfx: NONREACTIVE

## 2019-09-20 MED ORDER — CHLORHEXIDINE GLUCONATE CLOTH 2 % EX PADS
6.0000 | MEDICATED_PAD | Freq: Every day | CUTANEOUS | Status: DC
  Administered 2019-09-20: 6 via TOPICAL

## 2019-09-20 MED ORDER — METOPROLOL TARTRATE 100 MG PO TABS: 50.0000 mg | ORAL_TABLET | Freq: Two times a day (BID) | ORAL | Status: DC

## 2019-09-20 NOTE — Discharge Summary (Signed)
Physician Discharge Summary  [name] MWN:[id number] DOB: 07-21-1955 DOA: 09/19/2019  PCP: Rosita Fire, MD  Admit date: 09/19/2019 Discharge date: 09/20/2019  Time spent: 35 minutes  Recommendations for Outpatient Follow-up:  Repeat basic metabolic panel to evaluate lites renal function Repeat CBC to follow WBCs trend and the stability Reassess patient's blood pressure and further adjust antihypertensive regimen as required   Discharge Diagnoses:  Active Problems:   Obesity, Class III, BMI 40-49.9 (morbid obesity) (HCC)   Symptomatic bradycardia   Chronic diastolic HF (heart failure) (HCC) Chronic paroxysmal atrial fibrillation Type 2 diabetes with nephropathy Acute on chronic renal failure; chronic a stage IIIb at baseline. Leukocytosis Presyncope  Discharge Condition: Stable and improved.  Discharged back to skilled nursing facility for further care and rehabilitation.  Outpatient follow-up with cardiology service and with physical therapy recommended.  CODE STATUS: Full code  Diet recommendation: Modified carbohydrate diet, low calorie and low sodium diet.  Filed Weights   09/19/19 1522  Weight: 110.2 kg    History of present illness:  As per H&P written by Dr. Denton Brick on 09/19/2019 64 y.o. female with medical history significant for diabetes mellitus, hypertension, mental handicap, morbid obesity BMI 45, OSA, diastolic CHF. Patient was brought to the ED via EMS reports of low blood pressure.  Patient reported earlier today she was dizzy, with a feeling that she would pass out, but she did not lose consciousness,. Staff at nursing home reported blood pressure of 82/58 at that time. Patient denies vomiting, she reports 3 normal bowel movements today, she has maintained good p.o. intake.  No chest pain, no difficulty breathing.  Patient's antihypertensive medications were recently adjusted, lisinopril was added. She reports a cough that started today in the ED, no  difficulty breathing, no chest pain, no pain with urination.  ED Course: Heart rate down to 39, gradually improved in the ED. blood pressure 101/53.  WBC 14.3.  Potassium 5.2.  Sodium 135.  Creatinine elevated 2.28.  Magnesium and troponin, within normal limits.  Chest x-ray showed chronic appearing increased lung markings without evidence of acute disease.  500 mill bolus normal saline given.  Hospitalist to admit for presyncope, symptomatic bradycardia, and acute kidney injury  Hospital Course:  1-Presyncope/symptomatic bradycardia -Patient with underlying history of chronic paroxysmal atrial flutter/A. Fib -Electrolytes within normal limits at discharge -Patient's blood pressure and heart rate is stabilized and within normal limits -Patient will continue the use of Cardizem 240 mg daily along with metoprolol 50 mg twice daily. -Outpatient follow-up with cardiology service has been recommended. -Patient advised to maintain adequate hydration.  2-acute on chronic renal failure stage IIIb at baseline -In the setting of hypotension and continue use of nephrotoxic agents -Improving back to baseline at time of discharge after giving fluid resuscitation and holding nephrotoxic agents for 24 hours. -At discharge is stable to resume the use of metolazone, Lasix, indoor and hydralazine -Lisinopril has been kept on hold at time of discharge. -Repeat basic metabolic panel to evaluate lites and renal function.  3-mild hyperkalemia -In the setting of new lisinopril addition to her regimen -Lisinopril has been discontinued discharge. -Repeat basic metabolic panel follow-up visit.  4-reactive leukocytosis -No signs of acute infection appreciated -Resolved after fluid resuscitation -Follow CBC at follow-up visit.  5-history of chronic paroxysmal atrial fibrillation -Rate control at discharge -Resume the use of Cardizem and adjusted dose of metoprolol -Continue Eliquis for secondary  prevention.  6-type 2 diabetes mellitus with nephropathy -Most recent A1c 7.7 -Resume home  insulin therapy -Continue outpatient follow-up with endocrinologist.  7-chronic diastolic heart failure -Compensated -Most recent echo with ejection fraction of 60 to 65% -Resume home heart failure therapy and follow-up with cardiology as an outpatient -Advised to follow low-sodium diet and to check her weight on daily basis.  8-morbid obesity -Body mass index is 45.91 kg/m. -Low calorie diet, portion control increase physical activity discussed with patient.   Procedures: See below for x-ray reports.  Consultations:  None  Discharge Exam: Vitals:   09/20/19 0807 09/20/19 1316  BP:  (!) 163/65  Pulse:  65  Resp:  18  Temp:  97.6 F (36.4 C)  SpO2: 98% 100%    General: Afebrile, no shortness of breath, no nausea, no vomiting.  No further episode of feeling dizzy or lightheadedness.  Denies chest pain or palpitation. Cardiovascular: Rate controlled, no rubs, no gallops, unable to assess JVD with body habitus. Respiratory: Good air movement bilaterally, patient denies orthopnea.  No using accessory muscle.  Normal respiratory effort. Abdomen: Obese, soft, nontender, positive bowel sounds Extremities: No cyanosis or clubbing.  Discharge Instructions   Discharge Instructions    (HEART FAILURE PATIENTS) Call MD:  Anytime you have any of the following symptoms: 1) 3 pound weight gain in 24 hours or 5 pounds in 1 week 2) shortness of breath, with or without a dry hacking cough 3) swelling in the hands, feet or stomach 4) if you have to sleep on extra pillows at night in order to breathe.   Complete by: As directed    Diet - low sodium heart healthy   Complete by: As directed    Discharge instructions   Complete by: As directed    Follow low-sodium diet Check weight on daily basis Take medications as prescribed When follow-up with cardiology service in 2 weeks Follow-up with PCP  in 10 days.     Allergies as of 09/20/2019   No Known Allergies     Medication List    STOP taking these medications   lisinopril 5 MG tablet Commonly known as: ZESTRIL     TAKE these medications   acetaminophen 325 MG tablet Commonly known as: TYLENOL Take 325 mg by mouth every 12 (twelve) hours as needed for moderate pain.   albuterol 108 (90 Base) MCG/ACT inhaler Commonly known as: VENTOLIN HFA Inhale 2 puffs into the lungs every 6 (six) hours as needed for shortness of breath.   apixaban 5 MG Tabs tablet Commonly known as: ELIQUIS Take 1 tablet (5 mg total) by mouth 2 (two) times daily.   betamethasone dipropionate 0.05 % ointment Commonly known as: DIPROLENE Apply 1 application topically daily as needed (for rash under breasts).   cetirizine 10 MG tablet Commonly known as: ZYRTEC Take 10 mg by mouth daily.   clotrimazole 1 % cream Commonly known as: LOTRIMIN Apply 1 application topically daily as needed (under breasts as needed for rash).   clotrimazole-betamethasone cream Commonly known as: LOTRISONE Apply 1 application topically 2 (two) times daily. Applied under breasts   Diabetic Tussin EX 100 MG/5ML syrup Generic drug: guaifenesin Take 100 mg by mouth every 4 (four) hours as needed for cough or congestion.   diltiazem 240 MG 24 hr capsule Commonly known as: CARDIZEM CD Take 1 capsule (240 mg total) by mouth daily.   fluticasone 50 MCG/ACT nasal spray Commonly known as: FLONASE Place 2 sprays into both nostrils daily as needed for allergies or rhinitis.   furosemide 40 MG tablet Commonly known as: LASIX  Take 40 mg by mouth See admin instructions. Take one tablet by mouth TWICE DAILY ALTERNATING with taking one tablet by mouth ONCE DAILY   HumuLIN R U-500 KwikPen 500 UNIT/ML kwikpen Generic drug: insulin regular human CONCENTRATED Inject 25 Units into the skin 3 (three) times daily with meals. What changed:   how much to take  additional  instructions   hydrALAZINE 25 MG tablet Commonly known as: APRESOLINE Take 1 tablet (25 mg total) by mouth every 8 (eight) hours for 30 days. What changed:   when to take this  Another medication with the same name was removed. Continue taking this medication, and follow the directions you see here.   ipratropium-albuterol 0.5-2.5 (3) MG/3ML Soln Commonly known as: DUONEB Take 3 mLs by nebulization every 6 (six) hours as needed (for cough).   isosorbide dinitrate 10 MG tablet Commonly known as: ISORDIL Take 1 tablet (10 mg total) by mouth 3 (three) times daily.   metolazone 5 MG tablet Commonly known as: ZAROXOLYN Take 5 mg by mouth daily.   metoprolol tartrate 100 MG tablet Commonly known as: LOPRESSOR Take 0.5 tablets (50 mg total) by mouth 2 (two) times daily. What changed:   how much to take  when to take this   montelukast 10 MG tablet Commonly known as: SINGULAIR Take 10 mg by mouth at bedtime.   polyethylene glycol powder 17 GM/SCOOP powder Commonly known as: GLYCOLAX/MIRALAX Take 17 g by mouth daily as needed for mild constipation or moderate constipation.   potassium chloride SA 20 MEQ tablet Commonly known as: KLOR-CON Take 20 mEq by mouth 2 (two) times daily.   rosuvastatin 20 MG tablet Commonly known as: Crestor Take 1 tablet (20 mg total) by mouth daily.   Trelegy Ellipta 100-62.5-25 MCG/INH Aepb Generic drug: Fluticasone-Umeclidin-Vilant Inhale 1 puff into the lungs daily.   triamcinolone cream 0.5 % Commonly known as: KENALOG Apply 1 application topically every 12 (twelve) hours as needed (applied to bilateral legs for allergic dermatitis).   Vitamin D (Cholecalciferol) 25 MCG (1000 UT) Tabs Take 1,000 Units by mouth daily.      No Known Allergies  Follow-up Information    Rosita Fire, MD. Schedule an appointment as soon as possible for a visit in 10 day(s).   Specialty: Internal Medicine Contact information: [place] [address] [phone]        Josue Hector, MD .   Specialty: Cardiology Contact information: [phone] [address] [place] [address] [phone]               The results of significant diagnostics from this hospitalization (including imaging, microbiology, ancillary and laboratory) are listed below for reference.    Significant Diagnostic Studies: US RENAL  Result Date: 09/20/2019 CLINICAL DATA:  Acute on chronic kidney disease. EXAM: RENAL / URINARY TRACT ULTRASOUND COMPLETE COMPARISON:  05/21/2018 renal sonogram. FINDINGS: Right Kidney: Renal measurements: 11.1 x 5.4 x 4.5 cm = volume: 142 mL . Echogenicity within normal limits. No mass or hydronephrosis visualized. Left Kidney: Renal measurements: 11.9 x 5.2 x 5.8 cm = volume: 185 mL. Echogenicity within normal limits. No mass or hydronephrosis visualized. Bladder: Appears normal for degree of bladder distention. Bilateral ureteral jets visualized in the bladder lumen. Other: None. IMPRESSION: No hydronephrosis. Unremarkable ultrasound of the kidneys and bladder. Electronically Signed   By: Ilona Sorrel M.D.   On: 09/20/2019 09:07   DG Chest Portable 1 View  Result Date: 09/19/2019 CLINICAL DATA:  Low blood pressure. EXAM: PORTABLE CHEST 1 VIEW COMPARISON:  May 26, 2018 FINDINGS: Mild, diffuse chronic appearing increased lung markings are seen without evidence of acute infiltrate, pleural effusion or pneumothorax. The cardiac silhouette is moderately enlarged and unchanged in size. The visualized skeletal structures are unremarkable. IMPRESSION: Chronic appearing increased lung markings without evidence of acute or active cardiopulmonary disease. Electronically Signed   By: Virgina Norfolk M.D.   On: 09/19/2019 16:44    Microbiology: Recent Results (from the past 240 hour(s))  SARS Coronavirus 2 by RT PCR (hospital order, performed in [facility] hospital lab) Nasopharyngeal  Nasopharyngeal Swab     Status: None   Collection Time: 09/19/19  3:49 PM   Specimen: Nasopharyngeal Swab  Result Value Ref Range Status   SARS Coronavirus 2 NEGATIVE NEGATIVE Final    Comment: (NOTE) SARS-CoV-2 target nucleic acids are NOT DETECTED.  The SARS-CoV-2 RNA is generally detectable in upper and lower respiratory specimens during the acute phase of infection. The lowest concentration of SARS-CoV-2 viral copies this assay can detect is 250 copies / mL. A negative result does not preclude SARS-CoV-2 infection and should not be used as the sole basis for treatment or other patient management decisions.  A negative result may occur with improper specimen collection / handling, submission of specimen other than nasopharyngeal swab, presence of viral mutation(s) within the areas targeted by this assay, and inadequate number of viral copies (<250 copies / mL). A negative result must be combined with clinical observations, patient history, and epidemiological information.  Fact Sheet for Patients:   StrictlyIdeas.no  Fact Sheet for Healthcare Providers: BankingDealers.co.za  This test is not yet approved or  cleared by the [place] FDA and has been authorized for detection and/or diagnosis of SARS-CoV-2 by FDA under an Emergency Use Authorization (EUA).  This EUA will remain in effect (meaning this test can be used) for the duration of the COVID-19 declaration under Section 564(b)(1) of the Act, 21 U.S.C. section 360bbb-3(b)(1), unless the authorization is terminated or revoked sooner.  Performed at [facility], [address] [place], [place] [address]      Labs: Basic Metabolic Panel: Recent Labs  Lab 09/19/19 1555 09/20/19 0514  NA 135 138  K 5.2* 3.8  CL 100 103  CO2 24 24  GLUCOSE 265* 176*  BUN 76* 63*  CREATININE 2.28* 1.64*  CALCIUM 9.2 9.0  MG 2.0  --    Liver Function Tests: Recent Labs  Lab  09/19/19 1555  AST 24  ALT 23  ALKPHOS 87  BILITOT 0.4  PROT 7.3  ALBUMIN 3.5   CBC: Recent Labs  Lab 09/19/19 1555 09/20/19 0514  WBC 14.8* 8.9  NEUTROABS 11.9*  --   HGB 10.9* 10.1*  HCT 33.9* 31.5*  MCV 94.4 92.4  PLT 353 228    CBG: Recent Labs  Lab 09/19/19 2220 09/20/19 0944 09/20/19 1134 09/20/19 1135  GLUCAP 276* 278* 239* 224*    Signed:  Barton Dubois MD.  [facility] 09/20/2019, 2:25 PM

## 2019-09-20 NOTE — TOC Transition Note (Signed)
Transition of Care [facility]) - CM/SW Discharge Note   Patient Details  Name: [name] MRN: [id number] Date of Birth: [birth year]  [facility] CM/SW Contact:  Natasha Bence, LCSW Phone Number: 09/20/2019, 3:41 PM   Clinical Narrative:    Patient will be discharging back to [facility]. CSW has notified [facility] of discharge and has faxed discharge summery with medication orders to [facility]. CSW has also notified [facility] of discharge. TOC signing off.    Final next level of care: Assisted Living Barriers to Discharge: Barriers Resolved   Patient Goals and CMS Choice Patient states their goals for this hospitalization and ongoing recovery are:: Return to ALF      Discharge Placement                Patient to be transferred to facility by: [facility] Name of family member notified: [place]      [phone] Patient and family notified of of transfer: 09/20/19  Discharge Plan and Services                                     Social Determinants of Health (SDOH) Interventions     Readmission Risk Interventions No flowsheet data found.

## 2019-09-20 NOTE — Progress Notes (Signed)
Inpatient Diabetes Program Recommendations  AACE/ADA: New Consensus Statement on Inpatient Glycemic Control (2015)  Target Ranges:  Prepandial:   less than 140 mg/dL      Peak postprandial:   less than 180 mg/dL (1-2 hours)      Critically ill patients:  140 - 180 mg/dL   Results for [name], [name] (MRN [id number]) as of 09/20/2019 10:35  Ref. Range 09/19/2019 22:20 09/20/2019 09:44  Glucose-Capillary Latest Ref Range: 70 - 99 mg/dL 276 (H)  2 units NOVOLOG  278 (H)  8 units NOVOLOG     Admit with: Hypotension/ Symptomatic Bradycardia  History: DM, CHF, CKD  Home DM Meds: U500 Concentrated Insulin 20 units TID when CBG >90 mg/dl  Current Orders: Novolog Moderate Correction Scale/ SSI (0-15 units) TID AC + HS    Endocrinologist: Dr. Dorris Fetch in [place]--last seen 09/19/2019--Was told to increase her U500 Insulin to 25 units TID at that visit (HOLD if CBG <90 or not eating)     MD- Note CBG 278 this AM.  Please consider starting U500 Insulin for patient-Could start with 15 units TID with meals (75% total home dose)  Pharmacy can assist with entering the U500 Insulin orders if needed    --Will follow patient during hospitalization--  Wyn Quaker RN, MSN, CDE Diabetes Coordinator Inpatient Glycemic Control Team Team Pager: [phone] (8a-5p)

## 2019-09-21 ENCOUNTER — Telehealth: Payer: Self-pay | Admitting: Cardiovascular Disease

## 2019-09-21 NOTE — Telephone Encounter (Signed)
[facility] calling wanting verbal order for pt PT for 2 weeks due to patients unsteady gate.Discharged yesterday from [facility] for hypotension. Has f/u apt on 7/19 with Gerrianne Scale, PA-C

## 2019-09-21 NOTE — Telephone Encounter (Signed)
Christina from [facility] is calling to get verbal orders for this patient. Please advise.

## 2019-09-21 NOTE — Telephone Encounter (Signed)
Would ask PCP.

## 2019-09-21 NOTE — Telephone Encounter (Signed)
[facility] [place], "[facility]"  notified of pt's f/u apt

## 2019-09-22 NOTE — Telephone Encounter (Signed)
LM for Christina to call PCP for orders for PT per Dr.Koneswaran

## 2019-09-27 DIAGNOSIS — I10 Essential (primary) hypertension: Secondary | ICD-10-CM | POA: Diagnosis not present

## 2019-09-27 DIAGNOSIS — E1165 Type 2 diabetes mellitus with hyperglycemia: Secondary | ICD-10-CM | POA: Diagnosis not present

## 2019-09-27 DIAGNOSIS — Z79899 Other long term (current) drug therapy: Secondary | ICD-10-CM | POA: Diagnosis not present

## 2019-10-09 ENCOUNTER — Ambulatory Visit: Payer: Medicare Other | Admitting: Physician Assistant

## 2019-10-10 NOTE — Progress Notes (Signed)
Cardiology Office Note    Date:  10/16/2019   ID:  [name], DOB 1955-06-04, MRN [id number]  PCP:  Rosita Fire, MD  Cardiologist: Jenkins Rouge, MD EPS: None  Chief Complaint  Patient presents with  . Follow-up    History of Present Illness:  [name] is a 64 y.o. female with history of hypertension, atrial fibrillation/flutter DM, chronic diastolic CHF, morbid obesity, mental handicap, OSA, CKD stage 3, ventricular tachycardia in the setting of multiple medical issues including severe hyperkalemia with IV amiodarone.  2D echo 04/2018 normal LVEF greater than 65% with impaired relaxation.  Patient was admitted to the hospital with hypotension blood pressure of 82/58 dizziness and presyncope.  She had 3 bowel movements that day.  Heart rate was down to 39 creatinine 2.28 responded to IV fluids.  She was discharged on diltiazem and lower dose metoprolol.  She had acute on chronic renal failure and metolazone Lasix Imdur and hydralazine were held but restarted at discharge.  Lisinopril was kept on hold at discharge. Crt 1.64 at discharge.  Patient comes in for f/u from [facility]. No further dizziness or presyncope. Occasional skipping heart beat and feels better if she lays down. She doesn't think it goes fast. No shortness of breath.    Past Medical History:  Diagnosis Date  . Arthritis   . Asthma   . Atrial fibrillation ([place])   . Chronic diastolic CHF (congestive heart failure) ([place])   . CKD (chronic kidney disease), stage III   . Diabetes mellitus type 2 in obese ([place])   . Hypertension   . Iron deficiency anemia 10/16/2010  . Mental handicap 10/16/2010  . Mild CAD 2013  . Morbid obesity ([place])   . OSA (obstructive sleep apnea)     Past Surgical History:  Procedure Laterality Date  . ABDOMINAL HYSTERECTOMY    . AV FISTULA PLACEMENT Right 06/06/2018   Procedure: ARTERIOVENOUS (AV) FISTULA CREATION;  Surgeon: Marty Heck, MD;  Location: [facility];  Service:  Vascular;  Laterality: Right;  . CESAREAN SECTION    . CHOLECYSTECTOMY    . COLONOSCOPY  08/2010   normal TI, sigmoid polyp (adenoma ). Next TCS due  08/2015,  . ESOPHAGOGASTRODUODENOSCOPY  08/2010   antral and duodenal erosions s/p bx (chronic gastritis, no h.pylori, no celiac dz ), hiatal hernia  . FISTULA SUPERFICIALIZATION Right 08/25/2018   Procedure: FISTULA SUPERFICIALIZATION RIGHT ARM;  Surgeon: Waynetta Sandy, MD;  Location: [facility];  Service: Cardiovascular;  Laterality: Right;  . IR FLUORO GUIDE CV LINE RIGHT  05/31/2018  . IR US GUIDE VASC ACCESS RIGHT  05/31/2018  . KNEE SURGERY     right knee @ 64 years of age  . LEFT AND RIGHT HEART CATHETERIZATION WITH CORONARY ANGIOGRAM N/A 09/21/2011   Procedure: LEFT AND RIGHT HEART CATHETERIZATION WITH CORONARY ANGIOGRAM;  Surgeon: Birdie Riddle, MD;  Location: [facility];  Service: Cardiovascular;  Laterality: N/A;    Current Medications: Current Meds  Medication Sig  . acetaminophen (TYLENOL) 325 MG tablet Take 325 mg by mouth every 12 (twelve) hours as needed for moderate pain.  Marland Kitchen albuterol (PROVENTIL HFA;VENTOLIN HFA) 108 (90 Base) MCG/ACT inhaler Inhale 2 puffs into the lungs every 6 (six) hours as needed for shortness of breath.  Marland Kitchen apixaban (ELIQUIS) 5 MG TABS tablet Take 1 tablet (5 mg total) by mouth 2 (two) times daily.  . betamethasone dipropionate (DIPROLENE) 0.05 % ointment Apply 1 application topically daily as needed (for  rash under breasts).   . cetirizine (ZYRTEC) 10 MG tablet Take 10 mg by mouth daily.  . clotrimazole (LOTRIMIN) 1 % cream Apply 1 application topically daily as needed (under breasts as needed for rash).  . clotrimazole-betamethasone (LOTRISONE) cream Apply 1 application topically 2 (two) times daily. Applied under breasts  . DIABETIC TUSSIN EX 100 MG/5ML syrup Take 100 mg by mouth every 4 (four) hours as needed for cough or congestion.   Marland Kitchen diltiazem (CARDIZEM CD) 240 MG 24 hr capsule Take 1 capsule  (240 mg total) by mouth daily.  . fluticasone (FLONASE) 50 MCG/ACT nasal spray Place 2 sprays into both nostrils daily as needed for allergies or rhinitis.  . Fluticasone-Umeclidin-Vilant (TRELEGY ELLIPTA) 100-62.5-25 MCG/INH AEPB Inhale 1 puff into the lungs daily.   . furosemide (LASIX) 40 MG tablet Take 40 mg by mouth See admin instructions. Take one tablet by mouth TWICE DAILY ALTERNATING with taking one tablet by mouth ONCE DAILY  . hydrALAZINE (APRESOLINE) 25 MG tablet Take 1 tablet (25 mg total) by mouth every 8 (eight) hours for 30 days. (Patient taking differently: Take 25 mg by mouth 3 (three) times daily. )  . insulin regular human CONCENTRATED (HUMULIN R U-500 KWIKPEN) 500 UNIT/ML kwikpen Inject 25 Units into the skin 3 (three) times daily with meals. (Patient taking differently: Inject 20 Units into the skin 3 (three) times daily with meals. When blood sugar levels are greater than 90mg /dL)  . ipratropium-albuterol (DUONEB) 0.5-2.5 (3) MG/3ML SOLN Take 3 mLs by nebulization every 6 (six) hours as needed (for cough).   . isosorbide dinitrate (ISORDIL) 10 MG tablet Take 1 tablet (10 mg total) by mouth 3 (three) times daily.  . metolazone (ZAROXOLYN) 5 MG tablet Take 5 mg by mouth daily.   . metoprolol tartrate (LOPRESSOR) 100 MG tablet Take 0.5 tablets (50 mg total) by mouth 2 (two) times daily.  . montelukast (SINGULAIR) 10 MG tablet Take 10 mg by mouth at bedtime.   . polyethylene glycol powder (GLYCOLAX/MIRALAX) 17 GM/SCOOP powder Take 17 g by mouth daily as needed for mild constipation or moderate constipation.  . potassium chloride SA (KLOR-CON) 20 MEQ tablet Take 20 mEq by mouth 2 (two) times daily.  . rosuvastatin (CRESTOR) 20 MG tablet Take 1 tablet (20 mg total) by mouth daily.  Marland Kitchen triamcinolone cream (KENALOG) 0.5 % Apply 1 application topically every 12 (twelve) hours as needed (applied to bilateral legs for allergic dermatitis).   . Vitamin D, Cholecalciferol, 25 MCG (1000 UT)  TABS Take 1,000 Units by mouth daily.      Allergies:   Patient has no known allergies.   Social History   Socioeconomic History  . Marital status: Divorced    Spouse name: Not on file  . Number of children: 1  . Years of education: Not on file  . Highest education level: Not on file  Occupational History    Employer: UNEMPLOYED  Tobacco Use  . Smoking status: Former Smoker    Packs/day: 1.00    Years: 0.00    Pack years: 0.00    Types: Cigarettes    Quit date: 04/11/1996    Years since quitting: 23.5  . Smokeless tobacco: Never Used  . Tobacco comment: quit a couple year ago  Vaping Use  . Vaping Use: Never used  Substance and Sexual Activity  . Alcohol use: No  . Drug use: No  . Sexual activity: Never  Other Topics Concern  . Not on file  Social  History Narrative   Lives with parents.    Social Determinants of Health   Financial Resource Strain:   . Difficulty of Paying Living Expenses:   Food Insecurity:   . Worried About Charity fundraiser in the Last Year:   . Arboriculturist in the Last Year:   Transportation Needs:   . Film/video editor (Medical):   Marland Kitchen Lack of Transportation (Non-Medical):   Physical Activity:   . Days of Exercise per Week:   . Minutes of Exercise per Session:   Stress:   . Feeling of Stress :   Social Connections:   . Frequency of Communication with Friends and Family:   . Frequency of Social Gatherings with Friends and Family:   . Attends Religious Services:   . Active Member of Clubs or Organizations:   . Attends Archivist Meetings:   Marland Kitchen Marital Status:      Family History:  The patient's family history includes Atrial fibrillation in her father; Diabetes in her mother; Heart failure in her mother; Hypertension in her father, mother, and son; Kidney disease in an other family member.   ROS:   Please see the history of present illness.    ROS All other systems reviewed and are negative.   PHYSICAL EXAM:   VS:   BP 122/62   Pulse (!) 52   Wt 242 lb (109.8 kg)   SpO2 99%   BMI 45.73 kg/m   Physical Exam  TDS:KAJGO, in no acute distress  Neck: no JVD, carotid bruits, or masses Cardiac:RRR; 2/6 diastolic murmur LSB Respiratory:  clear to auscultation bilaterally, normal work of breathing GI: soft, nontender, nondistended, + BS Ext: without cyanosis, clubbing, or edema, Good distal pulses bilaterally Neuro:  Alert and Oriented x 3 Psych: euthymic mood, full affect  Wt Readings from Last 3 Encounters:  10/16/19 242 lb (109.8 kg)  09/19/19 243 lb (110.2 kg)  09/19/19 243 lb (110.2 kg)      Studies/Labs Reviewed:   EKG:  EKG is not ordered today.  T  Recent Labs: 09/19/2019: ALT 23; Magnesium 2.0; TSH 2.446 09/20/2019: BUN 63; Creatinine, Ser 1.64; Hemoglobin 10.1; Platelets 228; Potassium 3.8; Sodium 138   Lipid Panel    Component Value Date/Time   CHOL 191 09/12/2019 0831   TRIG 201 (H) 09/12/2019 0831   HDL 35 (L) 09/12/2019 0831   CHOLHDL 5.5 (H) 09/12/2019 0831   VLDL 59 (H) 06/02/2018 0029   LDLCALC 124 (H) 09/12/2019 0831    Additional studies/ records that were reviewed today include:  2D echocardiogram 05/22/2018 IMPRESSIONS      1. The left ventricle has hyperdynamic systolic function, with an ejection fraction of >65%. The cavity size was normal. Left ventricular diastolic Doppler parameters are consistent with impaired relaxation.  2. The right ventricle has normal systolic function. The cavity was normal. There is no increase in right ventricular wall thickness.  3. The mitral valve is normal in structure.  4. The tricuspid valve is normal in structure.  5. The aortic valve is tricuspid Mild thickening of the aortic valve Mild calcification of the aortic valve.  6. There is a small mobile density (1.1 x 0.5cm) on the ventricular surface of the aortic valve. Challenging to visualize. Consider TEE if clinically warranted.  7. The pulmonic valve was normal in structure.        ASSESSMENT:    1. Paroxysmal atrial fibrillation ([facility])   2. VT (ventricular tachycardia) ([place])  3. Chronic diastolic CHF (congestive heart failure) (H[facility])   4. Obesity, Class III, BMI 40-49.9 (morbid obesity) ([place])   5. ESRD (end stage renal disease) on dialysis [facility])      PLAN:  In order of problems listed above:  Paroxysmal atrial fibrillation with recent bradycardia metoprolol decreased and continued on diltiazem and Eliquis. Has occasional palpitations. Baseline HR 52 today. No dizziness or presyncope. Will continue current meds  History of ventricular tachycardia in the setting of hyperkalemia and multiple other medical issues resolved.  2D echo 04/2018 normal LVEF  Chronic diastolic CHF compensated. Check bmet today  Morbid obesity  CKD stage 3 with AKI, now back on metolazone and lasix. Check bmet. Not on lisinopril.    Medication Adjustments/Labs and Tests Ordered: Current medicines are reviewed at length with the patient today.  Concerns regarding medicines are outlined above.  Medication changes, Labs and Tests ordered today are listed in the Patient Instructions below. Patient Instructions  Medication Instructions:  Your physician recommends that you continue on your current medications as directed. Please refer to the Current Medication list given to you today.  *If you need a refill on your cardiac medications before your next appointment, please call your pharmacy*   Lab Work: BMET  If you have labs (blood work) drawn today and your tests are completely normal, you will receive your results only by: Marland Kitchen MyChart Message (if you have MyChart) OR . A paper copy in the mail If you have any lab test that is abnormal or we need to change your treatment, we will call you to review the results.   Testing/Procedures: None today   Follow-Up: At [facility], you and your health needs are our priority.  As part of our continuing mission to provide you with  exceptional heart care, we have created designated Provider Care Teams.  These Care Teams include your primary Cardiologist (physician) and Advanced Practice Providers (APPs -  Physician Assistants and Nurse Practitioners) who all work together to provide you with the care you need, when you need it.  We recommend signing up for the patient portal called "MyChart".  Sign up information is provided on this After Visit Summary.  MyChart is used to connect with patients for Virtual Visits (Telemedicine).  Patients are able to view lab/test results, encounter notes, upcoming appointments, etc.  Non-urgent messages can be sent to your provider as well.   To learn more about what you can do with MyChart, go to [email or web address].    Your next appointment:   2 month(s)  The format for your next appointment:   In Person  Provider:   Jenkins Rouge, MD   Other Instructions None     Thank you for choosing [facility] !            Sumner Boast, PA-C  10/16/2019 12:33 PM    [facility] [address], [place], [place]  [address] Phone: [phone]; Fax: [phone]

## 2019-10-12 DIAGNOSIS — R278 Other lack of coordination: Secondary | ICD-10-CM | POA: Diagnosis not present

## 2019-10-12 DIAGNOSIS — R269 Unspecified abnormalities of gait and mobility: Secondary | ICD-10-CM | POA: Diagnosis not present

## 2019-10-16 ENCOUNTER — Ambulatory Visit (INDEPENDENT_AMBULATORY_CARE_PROVIDER_SITE_OTHER): Payer: Medicare Other | Admitting: Physician Assistant

## 2019-10-16 ENCOUNTER — Other Ambulatory Visit: Payer: Self-pay

## 2019-10-16 ENCOUNTER — Other Ambulatory Visit (HOSPITAL_COMMUNITY)
Admission: RE | Admit: 2019-10-16 | Discharge: 2019-10-16 | Disposition: A | Discharge status: Home / Self Care | Payer: Medicare Other | Source: Ambulatory Visit | Attending: Physician Assistant | Admitting: Physician Assistant

## 2019-10-16 ENCOUNTER — Encounter: Payer: Self-pay | Admitting: Physician Assistant

## 2019-10-16 VITALS — BP 122/62 | HR 52 | Wt 242.0 lb

## 2019-10-16 DIAGNOSIS — I480 Paroxysmal atrial fibrillation: Secondary | ICD-10-CM | POA: Diagnosis not present

## 2019-10-16 DIAGNOSIS — Z992 Dependence on renal dialysis: Secondary | ICD-10-CM | POA: Insufficient documentation

## 2019-10-16 DIAGNOSIS — N186 End stage renal disease: Secondary | ICD-10-CM

## 2019-10-16 DIAGNOSIS — I472 Ventricular tachycardia, unspecified: Secondary | ICD-10-CM

## 2019-10-16 DIAGNOSIS — I5032 Chronic diastolic (congestive) heart failure: Secondary | ICD-10-CM

## 2019-10-16 LAB — BASIC METABOLIC PANEL
Anion gap: 11 (ref 5–15)
BUN: 52 mg/dL — ABNORMAL HIGH (ref 8–23)
CO2: 30 mmol/L (ref 22–32)
Calcium: 9.4 mg/dL (ref 8.9–10.3)
Chloride: 95 mmol/L — ABNORMAL LOW (ref 98–111)
Creatinine, Ser: 1.59 mg/dL — ABNORMAL HIGH (ref 0.44–1.00)
GFR calc Af Amer: 40 mL/min — ABNORMAL LOW (ref >60)
GFR calc non Af Amer: 34 mL/min — ABNORMAL LOW (ref >60)
Glucose, Bld: 233 mg/dL — ABNORMAL HIGH (ref 70–99)
Potassium: 3.5 mmol/L (ref 3.5–5.1)
Sodium: 136 mmol/L (ref 135–145)

## 2019-10-16 NOTE — Patient Instructions (Signed)
Medication Instructions:  Your physician recommends that you continue on your current medications as directed. Please refer to the Current Medication list given to you today.  *If you need a refill on your cardiac medications before your next appointment, please call your pharmacy*   Lab Work: BMET  If you have labs (blood work) drawn today and your tests are completely normal, you will receive your results only by: Marland Kitchen MyChart Message (if you have MyChart) OR . A paper copy in the mail If you have any lab test that is abnormal or we need to change your treatment, we will call you to review the results.   Testing/Procedures: None today   Follow-Up: At [facility], you and your health needs are our priority.  As part of our continuing mission to provide you with exceptional heart care, we have created designated Provider Care Teams.  These Care Teams include your primary Cardiologist (physician) and Advanced Practice Providers (APPs -  Physician Assistants and Nurse Practitioners) who all work together to provide you with the care you need, when you need it.  We recommend signing up for the patient portal called "MyChart".  Sign up information is provided on this After Visit Summary.  MyChart is used to connect with patients for Virtual Visits (Telemedicine).  Patients are able to view lab/test results, encounter notes, upcoming appointments, etc.  Non-urgent messages can be sent to your provider as well.   To learn more about what you can do with MyChart, go to [email or web address].    Your next appointment:   2 month(s)  The format for your next appointment:   In Person  Provider:   Jenkins Rouge, MD   Other Instructions None     Thank you for choosing [facility] !

## 2019-10-24 DIAGNOSIS — R269 Unspecified abnormalities of gait and mobility: Secondary | ICD-10-CM | POA: Diagnosis not present

## 2019-10-24 DIAGNOSIS — R278 Other lack of coordination: Secondary | ICD-10-CM | POA: Diagnosis not present

## 2019-10-27 DIAGNOSIS — N1831 Chronic kidney disease, stage 3a: Secondary | ICD-10-CM | POA: Diagnosis not present

## 2019-10-27 DIAGNOSIS — E1165 Type 2 diabetes mellitus with hyperglycemia: Secondary | ICD-10-CM | POA: Diagnosis not present

## 2019-10-30 DIAGNOSIS — E1165 Type 2 diabetes mellitus with hyperglycemia: Secondary | ICD-10-CM | POA: Diagnosis not present

## 2019-10-30 DIAGNOSIS — L03116 Cellulitis of left lower limb: Secondary | ICD-10-CM | POA: Diagnosis not present

## 2019-11-14 ENCOUNTER — Telehealth: Payer: Self-pay | Admitting: "Endocrinology

## 2019-11-14 DIAGNOSIS — U071 COVID-19: Secondary | ICD-10-CM | POA: Diagnosis not present

## 2019-11-14 DIAGNOSIS — Z20828 Contact with and (suspected) exposure to other viral communicable diseases: Secondary | ICD-10-CM | POA: Diagnosis not present

## 2019-11-14 NOTE — Telephone Encounter (Signed)
[name] called from [facility] and is needing a medication review done. They sent a form on 8/5 but has not received it, states the nurse can give her a call. [phone]

## 2019-11-14 NOTE — Telephone Encounter (Signed)
Talked with Tammy from [facility], faxed back med review.

## 2019-11-17 DIAGNOSIS — E211 Secondary hyperparathyroidism, not elsewhere classified: Secondary | ICD-10-CM | POA: Diagnosis not present

## 2019-11-17 DIAGNOSIS — R809 Proteinuria, unspecified: Secondary | ICD-10-CM | POA: Diagnosis not present

## 2019-11-17 DIAGNOSIS — E1129 Type 2 diabetes mellitus with other diabetic kidney complication: Secondary | ICD-10-CM | POA: Diagnosis not present

## 2019-11-17 DIAGNOSIS — N189 Chronic kidney disease, unspecified: Secondary | ICD-10-CM | POA: Diagnosis not present

## 2019-11-17 DIAGNOSIS — E1122 Type 2 diabetes mellitus with diabetic chronic kidney disease: Secondary | ICD-10-CM | POA: Diagnosis not present

## 2019-11-17 DIAGNOSIS — E876 Hypokalemia: Secondary | ICD-10-CM | POA: Diagnosis not present

## 2019-11-17 DIAGNOSIS — D631 Anemia in chronic kidney disease: Secondary | ICD-10-CM | POA: Diagnosis not present

## 2019-11-17 DIAGNOSIS — I5032 Chronic diastolic (congestive) heart failure: Secondary | ICD-10-CM | POA: Diagnosis not present

## 2019-11-17 DIAGNOSIS — I10 Essential (primary) hypertension: Secondary | ICD-10-CM | POA: Diagnosis not present

## 2019-11-21 DIAGNOSIS — U071 COVID-19: Secondary | ICD-10-CM | POA: Diagnosis not present

## 2019-11-21 DIAGNOSIS — Z20828 Contact with and (suspected) exposure to other viral communicable diseases: Secondary | ICD-10-CM | POA: Diagnosis not present

## 2019-11-22 DIAGNOSIS — U071 COVID-19: Secondary | ICD-10-CM | POA: Diagnosis not present

## 2019-11-22 DIAGNOSIS — Z20828 Contact with and (suspected) exposure to other viral communicable diseases: Secondary | ICD-10-CM | POA: Diagnosis not present

## 2019-11-23 DIAGNOSIS — Z20828 Contact with and (suspected) exposure to other viral communicable diseases: Secondary | ICD-10-CM | POA: Diagnosis not present

## 2019-11-23 DIAGNOSIS — I10 Essential (primary) hypertension: Secondary | ICD-10-CM | POA: Diagnosis not present

## 2019-11-23 DIAGNOSIS — E1129 Type 2 diabetes mellitus with other diabetic kidney complication: Secondary | ICD-10-CM | POA: Diagnosis not present

## 2019-11-23 DIAGNOSIS — E211 Secondary hyperparathyroidism, not elsewhere classified: Secondary | ICD-10-CM | POA: Diagnosis not present

## 2019-11-23 DIAGNOSIS — U071 COVID-19: Secondary | ICD-10-CM | POA: Diagnosis not present

## 2019-11-23 DIAGNOSIS — N189 Chronic kidney disease, unspecified: Secondary | ICD-10-CM | POA: Diagnosis not present

## 2019-11-23 DIAGNOSIS — D631 Anemia in chronic kidney disease: Secondary | ICD-10-CM | POA: Diagnosis not present

## 2019-11-23 DIAGNOSIS — R809 Proteinuria, unspecified: Secondary | ICD-10-CM | POA: Diagnosis not present

## 2019-11-23 DIAGNOSIS — E1122 Type 2 diabetes mellitus with diabetic chronic kidney disease: Secondary | ICD-10-CM | POA: Diagnosis not present

## 2019-11-24 DIAGNOSIS — B351 Tinea unguium: Secondary | ICD-10-CM | POA: Diagnosis not present

## 2019-11-24 DIAGNOSIS — M79675 Pain in left toe(s): Secondary | ICD-10-CM | POA: Diagnosis not present

## 2019-11-24 DIAGNOSIS — M79674 Pain in right toe(s): Secondary | ICD-10-CM | POA: Diagnosis not present

## 2019-11-28 DIAGNOSIS — Z20828 Contact with and (suspected) exposure to other viral communicable diseases: Secondary | ICD-10-CM | POA: Diagnosis not present

## 2019-11-28 DIAGNOSIS — U071 COVID-19: Secondary | ICD-10-CM | POA: Diagnosis not present

## 2019-11-30 DIAGNOSIS — I110 Hypertensive heart disease with heart failure: Secondary | ICD-10-CM | POA: Diagnosis not present

## 2019-11-30 DIAGNOSIS — N1831 Chronic kidney disease, stage 3a: Secondary | ICD-10-CM | POA: Diagnosis not present

## 2019-11-30 DIAGNOSIS — U071 COVID-19: Secondary | ICD-10-CM | POA: Diagnosis not present

## 2019-11-30 DIAGNOSIS — Z20828 Contact with and (suspected) exposure to other viral communicable diseases: Secondary | ICD-10-CM | POA: Diagnosis not present

## 2019-12-05 DIAGNOSIS — U071 COVID-19: Secondary | ICD-10-CM | POA: Diagnosis not present

## 2019-12-05 DIAGNOSIS — Z20828 Contact with and (suspected) exposure to other viral communicable diseases: Secondary | ICD-10-CM | POA: Diagnosis not present

## 2019-12-06 DIAGNOSIS — U071 COVID-19: Secondary | ICD-10-CM | POA: Diagnosis not present

## 2019-12-06 DIAGNOSIS — Z20828 Contact with and (suspected) exposure to other viral communicable diseases: Secondary | ICD-10-CM | POA: Diagnosis not present

## 2019-12-08 DIAGNOSIS — Z20828 Contact with and (suspected) exposure to other viral communicable diseases: Secondary | ICD-10-CM | POA: Diagnosis not present

## 2019-12-08 DIAGNOSIS — U071 COVID-19: Secondary | ICD-10-CM | POA: Diagnosis not present

## 2019-12-11 DIAGNOSIS — U071 COVID-19: Secondary | ICD-10-CM | POA: Diagnosis not present

## 2019-12-11 DIAGNOSIS — Z20828 Contact with and (suspected) exposure to other viral communicable diseases: Secondary | ICD-10-CM | POA: Diagnosis not present

## 2019-12-14 DIAGNOSIS — U071 COVID-19: Secondary | ICD-10-CM | POA: Diagnosis not present

## 2019-12-14 DIAGNOSIS — Z20828 Contact with and (suspected) exposure to other viral communicable diseases: Secondary | ICD-10-CM | POA: Diagnosis not present

## 2019-12-15 NOTE — Progress Notes (Signed)
Cardiology Office Note   Date:  12/18/2019   ID:  [name], DOB Sep 21, 1955, MRN [id number]  PCP:  Rosita Fire, MD  Cardiologist:  Was Dr. Bronson Ing   To Dr. Domenic Polite  Chief Complaint  Patient presents with  . Atrial Fibrillation      History of Present Illness: [name] is a 64 y.o. female who presents for tachybrady syndrome  She has a history of hypertension, atrial fibrillation/flutter DM, chronic diastolic CHF, morbid obesity, mental handicap, OSA, CKD stage 3, ventricular tachycardia in the setting of multiple medical issues including severe hyperkalemia with IV amiodarone.  2D echo 04/2018 normal LVEF greater than 65% with impaired relaxation. (remote cath 2013 with non obstructive disease with 30% narrowing of Ramus.  EF then was 35%  PAF , brady with decrease in BB and continued on dilt and eliquis  HR 52 at last   visit  Hx of VT in setting of hyperkalemia , normal EF 04/2018,  CKD 3 on metolzone and lasix.    Today she is doing well has been vaccinated for COVID does not know the dates.  occ rapid HR then id not aware of HR afterward. No chest pain. Mild edema of lower ext.  No issues with her COPD and Asthma, was a pt of Dr. Luan Pulling.  She will check with PCP about new pulmonologist.  HR is 52 today no lightheadedness or dizziness.  She was seen by renal and he increased lasix form 40 daily to 40 alternating with 80 every other day.    Past Medical History:  Diagnosis Date  . Arthritis   . Asthma   . Atrial fibrillation ([place])   . Chronic diastolic CHF (congestive heart failure) ([place])   . CKD (chronic kidney disease), stage III   . Diabetes mellitus type 2 in obese ([place])   . Hypertension   . Iron deficiency anemia 10/16/2010  . Mental handicap 10/16/2010  . Mild CAD 2013  . Morbid obesity ([place])   . OSA (obstructive sleep apnea)     Past Surgical History:  Procedure Laterality Date  . ABDOMINAL HYSTERECTOMY    . AV FISTULA PLACEMENT Right 06/06/2018    Procedure: ARTERIOVENOUS (AV) FISTULA CREATION;  Surgeon: Marty Heck, MD;  Location: [facility];  Service: Vascular;  Laterality: Right;  . CESAREAN SECTION    . CHOLECYSTECTOMY    . COLONOSCOPY  08/2010   normal TI, sigmoid polyp (adenoma ). Next TCS due  08/2015,  . ESOPHAGOGASTRODUODENOSCOPY  08/2010   antral and duodenal erosions s/p bx (chronic gastritis, no h.pylori, no celiac dz ), hiatal hernia  . FISTULA SUPERFICIALIZATION Right 08/25/2018   Procedure: FISTULA SUPERFICIALIZATION RIGHT ARM;  Surgeon: Waynetta Sandy, MD;  Location: [facility];  Service: Cardiovascular;  Laterality: Right;  . IR FLUORO GUIDE CV LINE RIGHT  05/31/2018  . IR US GUIDE VASC ACCESS RIGHT  05/31/2018  . KNEE SURGERY     right knee @ 64 years of age  . LEFT AND RIGHT HEART CATHETERIZATION WITH CORONARY ANGIOGRAM N/A 09/21/2011   Procedure: LEFT AND RIGHT HEART CATHETERIZATION WITH CORONARY ANGIOGRAM;  Surgeon: Birdie Riddle, MD;  Location: [facility] CATH [facility];  Service: Cardiovascular;  Laterality: N/A;     Current Outpatient Medications  Medication Sig Dispense Refill  . acetaminophen (TYLENOL) 325 MG tablet Take 325 mg by mouth every 12 (twelve) hours as needed for moderate pain.    Marland Kitchen albuterol (PROVENTIL HFA;VENTOLIN HFA) 108 (90 Base) MCG/ACT inhaler  Inhale 2 puffs into the lungs every 6 (six) hours as needed for shortness of breath.    Marland Kitchen apixaban (ELIQUIS) 5 MG TABS tablet Take 1 tablet (5 mg total) by mouth 2 (two) times daily. 60 tablet 0  . betamethasone dipropionate (DIPROLENE) 0.05 % ointment Apply 1 application topically daily as needed (for rash under breasts).     . cetirizine (ZYRTEC) 10 MG tablet Take 10 mg by mouth daily.    . clotrimazole (LOTRIMIN) 1 % cream Apply 1 application topically daily as needed (under breasts as needed for rash).    . clotrimazole-betamethasone (LOTRISONE) cream Apply 1 application topically 2 (two) times daily. Applied under breasts    . DIABETIC TUSSIN EX 100  MG/5ML syrup Take 100 mg by mouth every 4 (four) hours as needed for cough or congestion.     Marland Kitchen diltiazem (CARDIZEM CD) 240 MG 24 hr capsule Take 1 capsule (240 mg total) by mouth daily. 30 capsule 0  . fluticasone (FLONASE) 50 MCG/ACT nasal spray Place 2 sprays into both nostrils daily as needed for allergies or rhinitis.    . Fluticasone-Umeclidin-Vilant (TRELEGY ELLIPTA) 100-62.5-25 MCG/INH AEPB Inhale 1 puff into the lungs daily.     . furosemide (LASIX) 40 MG tablet Take 40 mg by mouth See admin instructions. Take one tablet by mouth TWICE DAILY ALTERNATING with taking one tablet by mouth ONCE DAILY    . insulin regular human CONCENTRATED (HUMULIN R U-500 KWIKPEN) 500 UNIT/ML kwikpen Inject 25 Units into the skin 3 (three) times daily with meals. (Patient taking differently: Inject 20 Units into the skin 3 (three) times daily with meals. When blood sugar levels are greater than 90mg /dL) 6 mL 2  . ipratropium-albuterol (DUONEB) 0.5-2.5 (3) MG/3ML SOLN Take 3 mLs by nebulization every 6 (six) hours as needed (for cough).     . isosorbide dinitrate (ISORDIL) 10 MG tablet Take 1 tablet (10 mg total) by mouth 3 (three) times daily. 90 tablet 0  . metolazone (ZAROXOLYN) 5 MG tablet Take 5 mg by mouth daily.     . metoprolol tartrate (LOPRESSOR) 100 MG tablet Take 0.5 tablets (50 mg total) by mouth 2 (two) times daily.    . montelukast (SINGULAIR) 10 MG tablet Take 10 mg by mouth at bedtime.     . polyethylene glycol powder (GLYCOLAX/MIRALAX) 17 GM/SCOOP powder Take 17 g by mouth daily as needed for mild constipation or moderate constipation.    . potassium chloride SA (KLOR-CON) 20 MEQ tablet Take 20 mEq by mouth 2 (two) times daily.    . rosuvastatin (CRESTOR) 20 MG tablet Take 1 tablet (20 mg total) by mouth daily. 90 tablet 1  . triamcinolone cream (KENALOG) 0.5 % Apply 1 application topically every 12 (twelve) hours as needed (applied to bilateral legs for allergic dermatitis).     . Vitamin D,  Cholecalciferol, 25 MCG (1000 UT) TABS Take 1,000 Units by mouth daily.     . hydrALAZINE (APRESOLINE) 25 MG tablet Take 1 tablet (25 mg total) by mouth every 8 (eight) hours for 30 days. (Patient taking differently: Take 25 mg by mouth 3 (three) times daily. ) 90 tablet 0   No current facility-administered medications for this visit.    Allergies:   Patient has no known allergies.    Social History:  The patient  reports that she quit smoking about 23 years ago. Her smoking use included cigarettes. She smoked 1.00 pack per day for 0.00 years. She has never used smokeless  tobacco. She reports that she does not drink alcohol and does not use drugs.   Family History:  The patient's family history includes Atrial fibrillation in her father; Diabetes in her mother; Heart failure in her mother; Hypertension in her father, mother, and son; Kidney disease in an other family member.    ROS:  General:no colds or fevers, + weight gain she believed to be due to overeating.  She mentions potato chips. Skin:no rashes or ulcers HEENT:no blurred vision, no congestion CV:see HPI PUL:see HPI GI:no diarrhea constipation or melena, no indigestion GU:no hematuria, no dysuria MS:no joint pain, no claudication Neuro:no syncope, no lightheadedness Endo:+ diabetes on insulin , no thyroid disease  Wt Readings from Last 3 Encounters:  12/18/19 252 lb 12.8 oz (114.7 kg)  10/16/19 242 lb (109.8 kg)  09/19/19 243 lb (110.2 kg)     PHYSICAL EXAM: VS:  BP (!) 130/54   Pulse (!) 52   Wt 252 lb 12.8 oz (114.7 kg)   SpO2 99%   BMI 47.77 kg/m  , BMI Body mass index is 47.77 kg/m. General:Pleasant affect, NAD Skin:Warm and dry, brisk capillary refill HEENT:normocephalic, sclera clear, mucus membranes moist Neck:supple, no JVD, no bruits  Heart:S1S2 RRR with 2/6 systolic murmur, no gallup, rub or click Lungs:clear without rales, rhonchi, or wheezes IRW:ERXV, non tender, + BS, do not palpate liver spleen or  masses Ext:1+ lower ext edema, 1+ pedal pulses, 2+ radial pulses Neuro:alert and oriented X3, MAE, follows commands, + facial symmetry    EKG:  EKG is NOT ordered today.    Recent Labs: 09/19/2019: ALT 23; Magnesium 2.0; TSH 2.446 09/20/2019: Hemoglobin 10.1; Platelets 228 10/16/2019: BUN 52; Creatinine, Ser 1.59; Potassium 3.5; Sodium 136    Lipid Panel    Component Value Date/Time   CHOL 191 09/12/2019 0831   TRIG 201 (H) 09/12/2019 0831   HDL 35 (L) 09/12/2019 0831   CHOLHDL 5.5 (H) 09/12/2019 0831   VLDL 59 (H) 06/02/2018 0029   LDLCALC 124 (H) 09/12/2019 0831       Other studies Reviewed: Additional studies/ records that were reviewed today include: . Cardiac cath 09/21/11  IMPRESSION OF HEART CATHETERIZATION:   1. Normal left main coronary artery. 2. Minimal disease of left anterior descending artery and its branches. 3. Mild disease of left circumflex artery and its branches. 4. Normal right coronary artery. 5. Modeate left ventricular systolic dysfunction.  LVEDP 20 mmHg.  Ejection fraction 35%. 6.  Mild pulmonary hypertension.  TEE 05/26/18  FINDINGS:  Aortic valve lesion noted, which appears to be heterogenous and calcified, suggesting a possible subacute/healed vegetation or calcified lesion in the setting of dialysis. Trivial aortic valve regurgitation. No lesions noted on mitral valve, tricuspid valve or pulmonary valve.   ASSESSMENT AND PLAN:  1.  PAF with occ racing HR less than 5 min.  On dilt and BB.  Also on eliquis and no bleeding continue.  Follow up in 2 months  2.  Tachy brady syndrome has been in junct rhythm to 30s and BB decreased currently stable.   3.  Hx of VTach in setting of hyperkalemia has normal EF in 2020  4.  Chronic diastolic HF stable today.  Last labs stable.  Mild lower ext edema, she does have occ snack of potato chips  5.  CKD stage 3 b with acute episode in 2020 she was on HD - but has stabilized without, followed by renal.   On lasix and metolazone   6.  Morbid obesity. Stable.  7.  CAD with minimal disease in 2013      Current medicines are reviewed with the patient today.  The patient Has no concerns regarding medicines.  The following changes have been made:  See above Labs/ tests ordered today include:see above  Disposition:   FU:  see above  Signed, Cecilie Kicks, NP  12/18/2019 4:23 PM    [facility] [place], [address] [place], [place] Phone: [phone]; Fax: [phone]

## 2019-12-18 ENCOUNTER — Other Ambulatory Visit: Payer: Self-pay

## 2019-12-18 ENCOUNTER — Encounter: Payer: Self-pay | Admitting: Cardiology

## 2019-12-18 ENCOUNTER — Ambulatory Visit (INDEPENDENT_AMBULATORY_CARE_PROVIDER_SITE_OTHER): Payer: Medicare Other | Admitting: Cardiology

## 2019-12-18 ENCOUNTER — Ambulatory Visit: Payer: Medicare Other | Admitting: Physician Assistant

## 2019-12-18 VITALS — BP 130/54 | HR 52 | Wt 252.8 lb

## 2019-12-18 DIAGNOSIS — I480 Paroxysmal atrial fibrillation: Secondary | ICD-10-CM

## 2019-12-18 DIAGNOSIS — I495 Sick sinus syndrome: Secondary | ICD-10-CM | POA: Diagnosis not present

## 2019-12-18 DIAGNOSIS — I5032 Chronic diastolic (congestive) heart failure: Secondary | ICD-10-CM | POA: Diagnosis not present

## 2019-12-18 DIAGNOSIS — I2510 Atherosclerotic heart disease of native coronary artery without angina pectoris: Secondary | ICD-10-CM

## 2019-12-18 DIAGNOSIS — I472 Ventricular tachycardia, unspecified: Secondary | ICD-10-CM

## 2019-12-18 NOTE — Patient Instructions (Signed)
Medication Instructions:  Your physician recommends that you continue on your current medications as directed. Please refer to the Current Medication list given to you today.  *If you need a refill on your cardiac medications before your next appointment, please call your pharmacy*   Lab Work: NONE   If you have labs (blood work) drawn today and your tests are completely normal, you will receive your results only by: Marland Kitchen MyChart Message (if you have MyChart) OR . A paper copy in the mail If you have any lab test that is abnormal or we need to change your treatment, we will call you to review the results.   Testing/Procedures: NONE    Follow-Up: At [facility], you and your health needs are our priority.  As part of our continuing mission to provide you with exceptional heart care, we have created designated Provider Care Teams.  These Care Teams include your primary Cardiologist (physician) and Advanced Practice Providers (APPs -  Physician Assistants and Nurse Practitioners) who all work together to provide you with the care you need, when you need it.  We recommend signing up for the patient portal called "MyChart".  Sign up information is provided on this After Visit Summary.  MyChart is used to connect with patients for Virtual Visits (Telemedicine).  Patients are able to view lab/test results, encounter notes, upcoming appointments, etc.  Non-urgent messages can be sent to your provider as well.   To learn more about what you can do with MyChart, go to [email or web address].    Your next appointment:   2 month(s)  The format for your next appointment:   In Person  Provider:   You may see Rozann Lesches, MD or one of the following Advanced Practice Providers on your designated Care Team:    Bernerd Pho, PA-C   Ermalinda Barrios, PA-C     Other Instructions Thank you for choosing [facility]!

## 2019-12-19 DIAGNOSIS — Z20828 Contact with and (suspected) exposure to other viral communicable diseases: Secondary | ICD-10-CM | POA: Diagnosis not present

## 2019-12-19 DIAGNOSIS — U071 COVID-19: Secondary | ICD-10-CM | POA: Diagnosis not present

## 2019-12-20 ENCOUNTER — Other Ambulatory Visit: Payer: Self-pay | Admitting: "Endocrinology

## 2019-12-21 DIAGNOSIS — Z20828 Contact with and (suspected) exposure to other viral communicable diseases: Secondary | ICD-10-CM | POA: Diagnosis not present

## 2019-12-21 DIAGNOSIS — U071 COVID-19: Secondary | ICD-10-CM | POA: Diagnosis not present

## 2019-12-26 DIAGNOSIS — U071 COVID-19: Secondary | ICD-10-CM | POA: Diagnosis not present

## 2019-12-26 DIAGNOSIS — Z20828 Contact with and (suspected) exposure to other viral communicable diseases: Secondary | ICD-10-CM | POA: Diagnosis not present

## 2019-12-28 DIAGNOSIS — Z20828 Contact with and (suspected) exposure to other viral communicable diseases: Secondary | ICD-10-CM | POA: Diagnosis not present

## 2019-12-28 DIAGNOSIS — U071 COVID-19: Secondary | ICD-10-CM | POA: Diagnosis not present

## 2019-12-29 DIAGNOSIS — Z23 Encounter for immunization: Secondary | ICD-10-CM | POA: Diagnosis not present

## 2019-12-30 DIAGNOSIS — J449 Chronic obstructive pulmonary disease, unspecified: Secondary | ICD-10-CM | POA: Diagnosis not present

## 2019-12-30 DIAGNOSIS — E1165 Type 2 diabetes mellitus with hyperglycemia: Secondary | ICD-10-CM | POA: Diagnosis not present

## 2020-01-02 DIAGNOSIS — U071 COVID-19: Secondary | ICD-10-CM | POA: Diagnosis not present

## 2020-01-02 DIAGNOSIS — Z20828 Contact with and (suspected) exposure to other viral communicable diseases: Secondary | ICD-10-CM | POA: Diagnosis not present

## 2020-01-04 DIAGNOSIS — Z20828 Contact with and (suspected) exposure to other viral communicable diseases: Secondary | ICD-10-CM | POA: Diagnosis not present

## 2020-01-04 DIAGNOSIS — U071 COVID-19: Secondary | ICD-10-CM | POA: Diagnosis not present

## 2020-01-08 ENCOUNTER — Other Ambulatory Visit: Payer: Self-pay | Admitting: "Endocrinology

## 2020-01-09 DIAGNOSIS — Z20828 Contact with and (suspected) exposure to other viral communicable diseases: Secondary | ICD-10-CM | POA: Diagnosis not present

## 2020-01-09 DIAGNOSIS — U071 COVID-19: Secondary | ICD-10-CM | POA: Diagnosis not present

## 2020-01-11 DIAGNOSIS — Z20828 Contact with and (suspected) exposure to other viral communicable diseases: Secondary | ICD-10-CM | POA: Diagnosis not present

## 2020-01-11 DIAGNOSIS — U071 COVID-19: Secondary | ICD-10-CM | POA: Diagnosis not present

## 2020-01-16 DIAGNOSIS — E1129 Type 2 diabetes mellitus with other diabetic kidney complication: Secondary | ICD-10-CM | POA: Diagnosis not present

## 2020-01-16 DIAGNOSIS — Z20828 Contact with and (suspected) exposure to other viral communicable diseases: Secondary | ICD-10-CM | POA: Diagnosis not present

## 2020-01-16 DIAGNOSIS — R809 Proteinuria, unspecified: Secondary | ICD-10-CM | POA: Diagnosis not present

## 2020-01-16 DIAGNOSIS — E1122 Type 2 diabetes mellitus with diabetic chronic kidney disease: Secondary | ICD-10-CM | POA: Diagnosis not present

## 2020-01-16 DIAGNOSIS — D631 Anemia in chronic kidney disease: Secondary | ICD-10-CM | POA: Diagnosis not present

## 2020-01-16 DIAGNOSIS — U071 COVID-19: Secondary | ICD-10-CM | POA: Diagnosis not present

## 2020-01-16 DIAGNOSIS — E211 Secondary hyperparathyroidism, not elsewhere classified: Secondary | ICD-10-CM | POA: Diagnosis not present

## 2020-01-16 DIAGNOSIS — I10 Essential (primary) hypertension: Secondary | ICD-10-CM | POA: Diagnosis not present

## 2020-01-16 DIAGNOSIS — N189 Chronic kidney disease, unspecified: Secondary | ICD-10-CM | POA: Diagnosis not present

## 2020-01-17 ENCOUNTER — Other Ambulatory Visit: Payer: Self-pay | Admitting: "Endocrinology

## 2020-01-18 DIAGNOSIS — U071 COVID-19: Secondary | ICD-10-CM | POA: Diagnosis not present

## 2020-01-18 DIAGNOSIS — Z20828 Contact with and (suspected) exposure to other viral communicable diseases: Secondary | ICD-10-CM | POA: Diagnosis not present

## 2020-01-19 ENCOUNTER — Ambulatory Visit: Payer: Medicare Other | Admitting: "Endocrinology

## 2020-01-22 ENCOUNTER — Encounter: Payer: Self-pay | Admitting: "Endocrinology

## 2020-01-22 ENCOUNTER — Other Ambulatory Visit: Payer: Self-pay

## 2020-01-22 ENCOUNTER — Ambulatory Visit (INDEPENDENT_AMBULATORY_CARE_PROVIDER_SITE_OTHER): Payer: Medicare Other | Admitting: "Endocrinology

## 2020-01-22 VITALS — BP 134/82 | HR 52 | Ht 61.0 in | Wt 254.0 lb

## 2020-01-22 DIAGNOSIS — N184 Chronic kidney disease, stage 4 (severe): Secondary | ICD-10-CM | POA: Diagnosis not present

## 2020-01-22 DIAGNOSIS — I2510 Atherosclerotic heart disease of native coronary artery without angina pectoris: Secondary | ICD-10-CM | POA: Diagnosis not present

## 2020-01-22 DIAGNOSIS — I10 Essential (primary) hypertension: Secondary | ICD-10-CM | POA: Diagnosis not present

## 2020-01-22 DIAGNOSIS — E1122 Type 2 diabetes mellitus with diabetic chronic kidney disease: Secondary | ICD-10-CM

## 2020-01-22 DIAGNOSIS — Z794 Long term (current) use of insulin: Secondary | ICD-10-CM

## 2020-01-22 DIAGNOSIS — E782 Mixed hyperlipidemia: Secondary | ICD-10-CM

## 2020-01-22 LAB — POCT GLYCOSYLATED HEMOGLOBIN (HGB A1C): Hemoglobin A1C: 7.3 % — AB (ref 4.0–5.6)

## 2020-01-22 NOTE — Progress Notes (Signed)
01/22/2020  Endocrinology follow-up note   Subjective:    Patient ID: [name], female    DOB: 05-May-1955, PCP Rosita Fire, MD   Past Medical History:  Diagnosis Date  . Arthritis   . Asthma   . Atrial fibrillation ([place])   . Chronic diastolic CHF (congestive heart failure) ([place])   . CKD (chronic kidney disease), stage III ([place])   . Diabetes mellitus type 2 in obese ([place])   . Hypertension   . Iron deficiency anemia 10/16/2010  . Mental handicap 10/16/2010  . Mild CAD 2013  . Morbid obesity ([place])   . OSA (obstructive sleep apnea)    Past Surgical History:  Procedure Laterality Date  . ABDOMINAL HYSTERECTOMY    . AV FISTULA PLACEMENT Right 06/06/2018   Procedure: ARTERIOVENOUS (AV) FISTULA CREATION;  Surgeon: Marty Heck, MD;  Location: [facility];  Service: Vascular;  Laterality: Right;  . CESAREAN SECTION    . CHOLECYSTECTOMY    . COLONOSCOPY  08/2010   normal TI, sigmoid polyp (adenoma ). Next TCS due  08/2015,  . ESOPHAGOGASTRODUODENOSCOPY  08/2010   antral and duodenal erosions s/p bx (chronic gastritis, no h.pylori, no celiac dz ), hiatal hernia  . FISTULA SUPERFICIALIZATION Right 08/25/2018   Procedure: FISTULA SUPERFICIALIZATION RIGHT ARM;  Surgeon: Waynetta Sandy, MD;  Location: [facility];  Service: Cardiovascular;  Laterality: Right;  . IR FLUORO GUIDE CV LINE RIGHT  05/31/2018  . IR US GUIDE VASC ACCESS RIGHT  05/31/2018  . KNEE SURGERY     right knee @ 64 years of age  . LEFT AND RIGHT HEART CATHETERIZATION WITH CORONARY ANGIOGRAM N/A 09/21/2011   Procedure: LEFT AND RIGHT HEART CATHETERIZATION WITH CORONARY ANGIOGRAM;  Surgeon: Birdie Riddle, MD;  Location: [facility];  Service: Cardiovascular;  Laterality: N/A;   Social History   Socioeconomic History  . Marital status: Divorced    Spouse name: Not on file  . Number of children: 1  . Years of education: Not on file  . Highest education level: Not on file  Occupational History    Employer:  UNEMPLOYED  Tobacco Use  . Smoking status: Former Smoker    Packs/day: 1.00    Years: 0.00    Pack years: 0.00    Types: Cigarettes    Quit date: 04/11/1996    Years since quitting: 23.7  . Smokeless tobacco: Never Used  . Tobacco comment: quit a couple year ago  Vaping Use  . Vaping Use: Never used  Substance and Sexual Activity  . Alcohol use: No  . Drug use: No  . Sexual activity: Never  Other Topics Concern  . Not on file  Social History Narrative   Lives with parents.    Social Determinants of Health   Financial Resource Strain:   . Difficulty of Paying Living Expenses: Not on file  Food Insecurity:   . Worried About Charity fundraiser in the Last Year: Not on file  . Ran Out of Food in the Last Year: Not on file  Transportation Needs:   . Lack of Transportation (Medical): Not on file  . Lack of Transportation (Non-Medical): Not on file  Physical Activity:   . Days of Exercise per Week: Not on file  . Minutes of Exercise per Session: Not on file  Stress:   . Feeling of Stress : Not on file  Social Connections:   . Frequency of Communication with Friends and Family: Not on file  .  Frequency of Social Gatherings with Friends and Family: Not on file  . Attends Religious Services: Not on file  . Active Member of Clubs or Organizations: Not on file  . Attends Archivist Meetings: Not on file  . Marital Status: Not on file   Outpatient Encounter Medications as of 01/22/2020  Medication Sig  . acetaminophen (TYLENOL) 325 MG tablet Take 325 mg by mouth every 12 (twelve) hours as needed for moderate pain.  Marland Kitchen albuterol (PROVENTIL HFA;VENTOLIN HFA) 108 (90 Base) MCG/ACT inhaler Inhale 2 puffs into the lungs every 6 (six) hours as needed for shortness of breath.  Marland Kitchen apixaban (ELIQUIS) 5 MG TABS tablet Take 1 tablet (5 mg total) by mouth 2 (two) times daily.  . betamethasone dipropionate (DIPROLENE) 0.05 % ointment Apply 1 application topically daily as needed (for  rash under breasts).   . cetirizine (ZYRTEC) 10 MG tablet Take 10 mg by mouth daily.  . clotrimazole (LOTRIMIN) 1 % cream Apply 1 application topically daily as needed (under breasts as needed for rash).  . clotrimazole-betamethasone (LOTRISONE) cream Apply 1 application topically 2 (two) times daily. Applied under breasts  . DIABETIC TUSSIN EX 100 MG/5ML syrup Take 100 mg by mouth every 4 (four) hours as needed for cough or congestion.   Marland Kitchen diltiazem (CARDIZEM CD) 240 MG 24 hr capsule Take 1 capsule (240 mg total) by mouth daily.  . fluticasone (FLONASE) 50 MCG/ACT nasal spray Place 2 sprays into both nostrils daily as needed for allergies or rhinitis.  . Fluticasone-Umeclidin-Vilant (TRELEGY ELLIPTA) 100-62.5-25 MCG/INH AEPB Inhale 1 puff into the lungs daily.   . furosemide (LASIX) 40 MG tablet Take 40 mg by mouth See admin instructions. Take one tablet by mouth TWICE DAILY ALTERNATING with taking one tablet by mouth ONCE DAILY  . hydrALAZINE (APRESOLINE) 25 MG tablet Take 1 tablet (25 mg total) by mouth every 8 (eight) hours for 30 days. (Patient taking differently: Take 25 mg by mouth 3 (three) times daily. )  . insulin regular human CONCENTRATED (HUMULIN R U-500 KWIKPEN) 500 UNIT/ML kwikpen Inject 25 Units into the skin 3 (three) times daily with meals. (Patient taking differently: Inject 30 Units into the skin 3 (three) times daily with meals. )  . ipratropium-albuterol (DUONEB) 0.5-2.5 (3) MG/3ML SOLN Take 3 mLs by nebulization every 6 (six) hours as needed (for cough).   . isosorbide dinitrate (ISORDIL) 10 MG tablet Take 1 tablet (10 mg total) by mouth 3 (three) times daily.  . Lancets 28G MISC USE TO CHECK BLOOD SUGAR 3 TIMES DAILY.  . metolazone (ZAROXOLYN) 5 MG tablet Take 5 mg by mouth daily.   . metoprolol tartrate (LOPRESSOR) 100 MG tablet Take 0.5 tablets (50 mg total) by mouth 2 (two) times daily.  . montelukast (SINGULAIR) 10 MG tablet Take 10 mg by mouth at bedtime.   .  polyethylene glycol powder (GLYCOLAX/MIRALAX) 17 GM/SCOOP powder Take 17 g by mouth daily as needed for mild constipation or moderate constipation.  . potassium chloride SA (KLOR-CON) 20 MEQ tablet Take 20 mEq by mouth 2 (two) times daily.  . rosuvastatin (CRESTOR) 20 MG tablet TAKE (1) TABLET BY MOUTH ONCE DAILY.  Marland Kitchen triamcinolone cream (KENALOG) 0.5 % Apply 1 application topically every 12 (twelve) hours as needed (applied to bilateral legs for allergic dermatitis).   . Vitamin D, Cholecalciferol, 25 MCG (1000 UT) TABS Take 1,000 Units by mouth daily.    No facility-administered encounter medications on file as of 01/22/2020.   ALLERGIES: No  Known Allergies VACCINATION STATUS: Immunization History  Administered Date(s) Administered  . Hepatitis B, adult 07/29/2018, 08/29/2018, 10/07/2018  . Influenza Inj Mdck Quad Pf 12/19/2016  . Influenza,inj,Quad PF,6+ Mos 02/02/2013, 05/15/2018  . Influenza-Unspecified 05/15/2018  . PPD Test 07/22/2018  . Pneumococcal Polysaccharide-23 05/15/2018  . Pneumococcal-Unspecified 05/15/2018    Diabetes She presents for her follow-up diabetic visit. She has type 2 diabetes mellitus. Onset time: she was diagnosed at approximate age of 43 years. Her disease course has been improving. There are no hypoglycemic associated symptoms. Pertinent negatives for hypoglycemia include no confusion, headaches, pallor or seizures. Associated symptoms include polydipsia and polyuria. Pertinent negatives for diabetes include no blurred vision, no chest pain, no fatigue and no polyphagia. There are no hypoglycemic complications. Symptoms are improving. Diabetic complications include nephropathy. Risk factors for coronary artery disease include diabetes mellitus, dyslipidemia, hypertension, obesity and sedentary lifestyle. Current diabetic treatment includes intensive insulin program. Her weight is fluctuating minimally. She is following a generally unhealthy diet. She has had a  previous visit with a dietitian. She never participates in exercise. Her home blood glucose trend is increasing steadily. (She is not accompanied by her aides today.  No documented hypoglycemia.  Her point-of-care A1c was 7.3%, improving from 7.7%.   ) An ACE inhibitor/angiotensin II receptor blocker is being taken.  Hypertension This is a chronic problem. The current episode started more than 1 year ago. The problem is uncontrolled. Pertinent negatives include no blurred vision, chest pain, headaches, palpitations or shortness of breath. Risk factors for coronary artery disease include diabetes mellitus, dyslipidemia, obesity and sedentary lifestyle. Past treatments include ACE inhibitors. Hypertensive end-organ damage includes kidney disease. Identifiable causes of hypertension include chronic renal disease.  Hyperlipidemia This is a chronic problem. The current episode started more than 1 year ago. The problem is uncontrolled. Exacerbating diseases include chronic renal disease, diabetes and obesity. Pertinent negatives include no chest pain, myalgias or shortness of breath. Current antihyperlipidemic treatment includes statins and fibric acid derivatives. Risk factors for coronary artery disease include diabetes mellitus, dyslipidemia, obesity, a sedentary lifestyle and hypertension.    Review of Systems  Constitutional: Positive for unexpected weight change. Negative for chills, fatigue and fever.  HENT: Negative for trouble swallowing and voice change.   Eyes: Negative for blurred vision and visual disturbance.  Respiratory: Negative for cough, shortness of breath and wheezing.   Cardiovascular: Negative for chest pain, palpitations and leg swelling.  Gastrointestinal: Negative for diarrhea, nausea and vomiting.  Endocrine: Positive for polydipsia and polyuria. Negative for cold intolerance, heat intolerance and polyphagia.  Musculoskeletal: Positive for gait problem. Negative for arthralgias  and myalgias.  Skin: Negative for color change, pallor, rash and wound.  Neurological: Negative for seizures and headaches.  Hematological: Does not bruise/bleed easily.  Psychiatric/Behavioral: Negative for confusion and suicidal ideas.    Objective:    BP 134/82   Pulse (!) 52   Ht 5\' 1"  (1.549 m)   Wt 254 lb (115.2 kg)   BMI 47.99 kg/m   Wt Readings from Last 3 Encounters:  01/22/20 254 lb (115.2 kg)  12/18/19 252 lb 12.8 oz (114.7 kg)  10/16/19 242 lb (109.8 kg)    Physical Exam Constitutional:      Appearance: She is well-developed.  HENT:     Head: Normocephalic and atraumatic.  Neck:     Thyroid: No thyromegaly.     Trachea: No tracheal deviation.  Cardiovascular:     Rate and Rhythm: Normal rate.  Pulmonary:  Effort: Pulmonary effort is normal.  Abdominal:     Tenderness: There is no abdominal tenderness. There is no guarding.  Musculoskeletal:     Cervical back: Normal range of motion and neck supple.  Skin:    General: Skin is dry.     Coloration: Skin is not pale.     Findings: No erythema or rash.  Neurological:     Mental Status: She is alert and oriented to person, place, and time.     Cranial Nerves: No cranial nerve deficit.     Coordination: Coordination normal.  Psychiatric:        Judgment: Judgment normal.     Results for orders placed or performed in visit on 01/22/20  HgB A1c  Result Value Ref Range   Hemoglobin A1C 7.3 (A) 4.0 - 5.6 %   HbA1c POC (<> result, manual entry)     HbA1c, POC (prediabetic range)     HbA1c, POC (controlled diabetic range)     Complete Blood Count (Most recent): Lab Results  Component Value Date   WBC 8.9 09/20/2019   HGB 10.1 (L) 09/20/2019   HCT 31.5 (L) 09/20/2019   MCV 92.4 09/20/2019   PLT 228 09/20/2019   Chemistry (most recent): Diabetic Labs (most recent): Lab Results  Component Value Date   HGBA1C 7.3 (A) 01/22/2020   HGBA1C 7.7 (A) 09/19/2019   HGBA1C 6.4 (A) 06/15/2019   Lipid  Panel     Component Value Date/Time   CHOL 191 09/12/2019 0831   TRIG 201 (H) 09/12/2019 0831   HDL 35 (L) 09/12/2019 0831   CHOLHDL 5.5 (H) 09/12/2019 0831   VLDL 59 (H) 06/02/2018 0029   LDLCALC 124 (H) 09/12/2019 0831    Assessment & Plan:   1. Type 2 diabetes mellitus with stage 4 chronic kidney disease, with long-term current use of insulin (HCC)  -Her diabetes is  complicated by CKD, congestive heart failure,  obesity/sedentary life, and patient remains at extremely high risk for more acute and chronic complications of diabetes which include CAD, CVA, CKD, retinopathy, and neuropathy. These are all discussed in detail with the patient.  She is not accompanied by her aides today.  No documented hypoglycemia.  Her point-of-care A1c was 7.3%, improving from 7.7%.     - I have re-counseled the patient on diet management and weight loss  by adopting a carbohydrate restricted / protein rich  Diet.  - she  admits there is a room for improvement in her diet and drink choices. -  Suggestion is made for her to avoid simple carbohydrates  from her diet including Cakes, Sweet Desserts / Pastries, Ice Cream, Soda (diet and regular), Sweet Tea, Candies, Chips, Cookies, Sweet Pastries,  Store Bought Juices, Alcohol in Excess of  1-2 drinks a day, Artificial Sweeteners, Coffee Creamer, and "Sugar-free" Products. This will help patient to have stable blood glucose profile and potentially avoid unintended weight gain.   - Patient is advised to stick to a routine mealtimes to eat 3 meals  a day and avoid unnecessary snacks ( to snack only to correct hypoglycemia).  - I have approached patient with the following individualized plan to manage diabetes and patient agrees.  -Her placement in group home is the best development for her lately.  -Her presenting glycemic profile is on or near target both fasting and postprandial.   -It is better for her to stay on Humulin RU 500 insulin for the sake of  simplicity.  -She is  advised to continue with Humulin U500 at 30 units 3 times a day with meals for premeal blood glucose readings above 90 mg/dl, associated with monitoring of blood glucose before meals and at bedtime. - She is advised to skip insulin if pre-meal blood glucose is below 90 mg/dL or if she is not eating. -She is not a candidate for metformin, SGLT2 inhibitors due to CKD.   - Patient specific target  for A1c; LDL, HDL, Triglycerides, were discussed in detail.  2) BP/HTN:  -Her blood pressure is controlled to target.   She is advised to restrict salt intake and I advised her to continue current medications including lisinopril 20 mg p.o. daily, hydralazine 25 mg p.o. 3 times daily, furosemide 40 mg p.o. Daily.  3) Lipids/HPL: Her recent lipid panel showed loss of control of dyslipidemia, LDL at 124 increasing from 80.  She is tolerating Crestor 20 mg p.o. nightly, advised to continue.  She is also on fenofibrate.     4)  Weight/Diet: Her BMI is 62.83  -clearly complicating her diabetes care.   She is a candidate for modest weight loss.  CDE consult in progress, exercise, and carbohydrates information provided.  5) Chronic Care/Health Maintenance:  -Patient is  on ACEI and encouraged to continue to follow up with Ophthalmology, Podiatrist at least yearly or according to recommendations, and advised to stay away from smoking. I have recommended yearly flu vaccine and pneumonia vaccination at least every 5 years;  and  sleep for at least 7 hours a day.    - I advised patient to maintain close follow up with Rosita Fire, MD for primary care needs.  - Time spent on this patient care encounter:  35 min, of which > 50% was spent in  counseling and the rest reviewing her blood glucose logs , discussing her hypoglycemia and hyperglycemia episodes, reviewing her current and  previous labs / studies  ( including abstraction from other facilities) and medications  doses and developing a   long term treatment plan and documenting her care.   Please refer to Patient Instructions for Blood Glucose Monitoring and Insulin/Medications Dosing Guide"  in media tab for additional information. Please  also refer to " Patient Self Inventory" in the Media  tab for reviewed elements of pertinent patient history.  [name] participated in the discussions, expressed understanding, and voiced agreement with the above plans.  All questions were answered to her satisfaction. she is encouraged to contact clinic should she have any questions or concerns prior to her return visit.    Follow up plan: -Return for F/U with Pre-visit Labs, Meter, Logs, A1c here., ABI in Office [place], Urine MA - NV.  Glade Lloyd, MD Phone: [phone]  Fax: [phone]  This note was partially dictated with voice recognition software. Similar sounding words can be transcribed inadequately or may not  be corrected upon review.  01/22/2020, 4:22 PM

## 2020-01-22 NOTE — Patient Instructions (Signed)
Advice for Weight Management  -For most of [place] the best way to lose weight is by diet management. Generally speaking, diet management means consuming less calories intentionally which over time brings about progressive weight loss.  This can be achieved more effectively by restricting carbohydrate consumption to the minimum possible.  So, it is critically important to know your numbers: how much calorie you are consuming and how much calorie you need. More importantly, our carbohydrates sources should be unprocessed or minimally processed complex starch food items.   Sometimes, it is important to balance nutrition by increasing protein intake (animal or plant source), fruits, and vegetables.  -Sticking to a routine mealtime to eat 3 meals a day and avoiding unnecessary snacks is shown to have a big role in weight control. Under normal circumstances, the only time we lose real weight is when we are hungry, so allow hunger to take place- hunger means no food between meal times, only water.  It is not advisable to starve.   -It is better to avoid simple carbohydrates including: Cakes, Sweet Desserts, Ice Cream, Soda (diet and regular), Sweet Tea, Candies, Chips, Cookies, Store Bought Juices, Alcohol in Excess of  1-2 drinks a day, Artificial Sweeteners, Doughnuts, Coffee Creamers, "Sugar-free" Products, etc, etc.  This is not a complete list...Marland Kitchen.    -Consulting with certified diabetes educators is proven to provide you with the most accurate and current information on diet.  Also, you may be  interested in discussing diet options/exchanges , we can schedule a visit with Jearld Fenton, RDN, CDE for individualized nutrition education.  -Exercise: If you are able: 30 -60 minutes a day ,4 days a week, or 150 minutes a week.  The longer the better.  Combine stretch, strength, and aerobic activities.  If you were told in the past that you  have high risk for cardiovascular diseases, you may seek evaluation by your heart doctor prior to initiating moderate to intense exercise programs.                                  Additional Care Considerations for Diabetes   -Diabetes  is a chronic disease.  The most important care consideration is regular follow-up with your diabetes care provider with the goal being avoiding or delaying its complications and to take advantage of advances in medications and technology.    -Type 2 diabetes is known to coexist with other important comorbidities such as high blood pressure and high cholesterol.  It is critical to control not only the diabetes but also the high blood pressure and high cholesterol to minimize and delay the risk of complications including coronary artery disease, stroke, amputations, blindness, etc.    - Studies showed that people with diabetes will benefit from a class of medications known as ACE inhibitors and statins.  Unless there are specific reasons not to be on these medications, the standard of care is to consider getting one from these groups of medications at an optimal doses.  These medications are generally considered safe and proven to help protect the heart and the kidneys.    - People with diabetes are encouraged to initiate and maintain regular follow-up with eye doctors, foot doctors, dentists ,  and if necessary heart and kidney doctors.     - It is highly recommended that people with diabetes quit smoking or stay away from smoking, and get yearly  flu vaccine and pneumonia vaccine at least every 5 years.  One other important lifestyle recommendation is to ensure adequate sleep - at least 6-7 hours of uninterrupted sleep at night.  -Exercise: If you are able: 30 -60 minutes a day, 4 days a week, or 150 minutes a week.  The longer the better.  Combine stretch, strength, and aerobic activities.  If you were told in the past that you have high risk for cardiovascular  diseases, you may seek evaluation by your heart doctor prior to initiating moderate to intense exercise programs.         0

## 2020-01-23 DIAGNOSIS — U071 COVID-19: Secondary | ICD-10-CM | POA: Diagnosis not present

## 2020-01-23 DIAGNOSIS — Z20828 Contact with and (suspected) exposure to other viral communicable diseases: Secondary | ICD-10-CM | POA: Diagnosis not present

## 2020-01-25 DIAGNOSIS — Z20828 Contact with and (suspected) exposure to other viral communicable diseases: Secondary | ICD-10-CM | POA: Diagnosis not present

## 2020-01-25 DIAGNOSIS — U071 COVID-19: Secondary | ICD-10-CM | POA: Diagnosis not present

## 2020-01-26 DIAGNOSIS — B351 Tinea unguium: Secondary | ICD-10-CM | POA: Diagnosis not present

## 2020-01-26 DIAGNOSIS — M79674 Pain in right toe(s): Secondary | ICD-10-CM | POA: Diagnosis not present

## 2020-01-26 DIAGNOSIS — M79675 Pain in left toe(s): Secondary | ICD-10-CM | POA: Diagnosis not present

## 2020-01-31 DIAGNOSIS — R809 Proteinuria, unspecified: Secondary | ICD-10-CM | POA: Diagnosis not present

## 2020-01-31 DIAGNOSIS — E1129 Type 2 diabetes mellitus with other diabetic kidney complication: Secondary | ICD-10-CM | POA: Diagnosis not present

## 2020-01-31 DIAGNOSIS — I10 Essential (primary) hypertension: Secondary | ICD-10-CM | POA: Diagnosis not present

## 2020-01-31 DIAGNOSIS — N189 Chronic kidney disease, unspecified: Secondary | ICD-10-CM | POA: Diagnosis not present

## 2020-01-31 DIAGNOSIS — D631 Anemia in chronic kidney disease: Secondary | ICD-10-CM | POA: Diagnosis not present

## 2020-01-31 DIAGNOSIS — E1122 Type 2 diabetes mellitus with diabetic chronic kidney disease: Secondary | ICD-10-CM | POA: Diagnosis not present

## 2020-01-31 DIAGNOSIS — I5032 Chronic diastolic (congestive) heart failure: Secondary | ICD-10-CM | POA: Diagnosis not present

## 2020-01-31 DIAGNOSIS — E211 Secondary hyperparathyroidism, not elsewhere classified: Secondary | ICD-10-CM | POA: Diagnosis not present

## 2020-02-01 DIAGNOSIS — U071 COVID-19: Secondary | ICD-10-CM | POA: Diagnosis not present

## 2020-02-01 DIAGNOSIS — Z20828 Contact with and (suspected) exposure to other viral communicable diseases: Secondary | ICD-10-CM | POA: Diagnosis not present

## 2020-02-06 DIAGNOSIS — U071 COVID-19: Secondary | ICD-10-CM | POA: Diagnosis not present

## 2020-02-06 DIAGNOSIS — Z20828 Contact with and (suspected) exposure to other viral communicable diseases: Secondary | ICD-10-CM | POA: Diagnosis not present

## 2020-02-08 DIAGNOSIS — U071 COVID-19: Secondary | ICD-10-CM | POA: Diagnosis not present

## 2020-02-08 DIAGNOSIS — Z20828 Contact with and (suspected) exposure to other viral communicable diseases: Secondary | ICD-10-CM | POA: Diagnosis not present

## 2020-02-13 ENCOUNTER — Other Ambulatory Visit (HOSPITAL_COMMUNITY): Payer: Self-pay | Admitting: Internal Medicine

## 2020-02-13 DIAGNOSIS — E1165 Type 2 diabetes mellitus with hyperglycemia: Secondary | ICD-10-CM | POA: Diagnosis not present

## 2020-02-13 DIAGNOSIS — Z1331 Encounter for screening for depression: Secondary | ICD-10-CM | POA: Diagnosis not present

## 2020-02-13 DIAGNOSIS — Z1389 Encounter for screening for other disorder: Secondary | ICD-10-CM | POA: Diagnosis not present

## 2020-02-13 DIAGNOSIS — J449 Chronic obstructive pulmonary disease, unspecified: Secondary | ICD-10-CM | POA: Diagnosis not present

## 2020-02-13 DIAGNOSIS — Z20828 Contact with and (suspected) exposure to other viral communicable diseases: Secondary | ICD-10-CM | POA: Diagnosis not present

## 2020-02-13 DIAGNOSIS — E1122 Type 2 diabetes mellitus with diabetic chronic kidney disease: Secondary | ICD-10-CM | POA: Diagnosis not present

## 2020-02-13 DIAGNOSIS — I5032 Chronic diastolic (congestive) heart failure: Secondary | ICD-10-CM | POA: Diagnosis not present

## 2020-02-13 DIAGNOSIS — Z1231 Encounter for screening mammogram for malignant neoplasm of breast: Secondary | ICD-10-CM

## 2020-02-13 DIAGNOSIS — U071 COVID-19: Secondary | ICD-10-CM | POA: Diagnosis not present

## 2020-02-15 ENCOUNTER — Encounter: Payer: Self-pay | Admitting: Internal Medicine

## 2020-02-15 DIAGNOSIS — U071 COVID-19: Secondary | ICD-10-CM | POA: Diagnosis not present

## 2020-02-15 DIAGNOSIS — Z20828 Contact with and (suspected) exposure to other viral communicable diseases: Secondary | ICD-10-CM | POA: Diagnosis not present

## 2020-02-15 IMAGING — DX DG CHEST 2V
2 series · 2 of 2 positions shown · non-contrast
Comparison: 07/22/2017 and chest CT dated 08/10/2016.

CLINICAL DATA: Cough.  Chronic bronchitis.

EXAM:
CHEST - 2 VIEW

[chest pa]
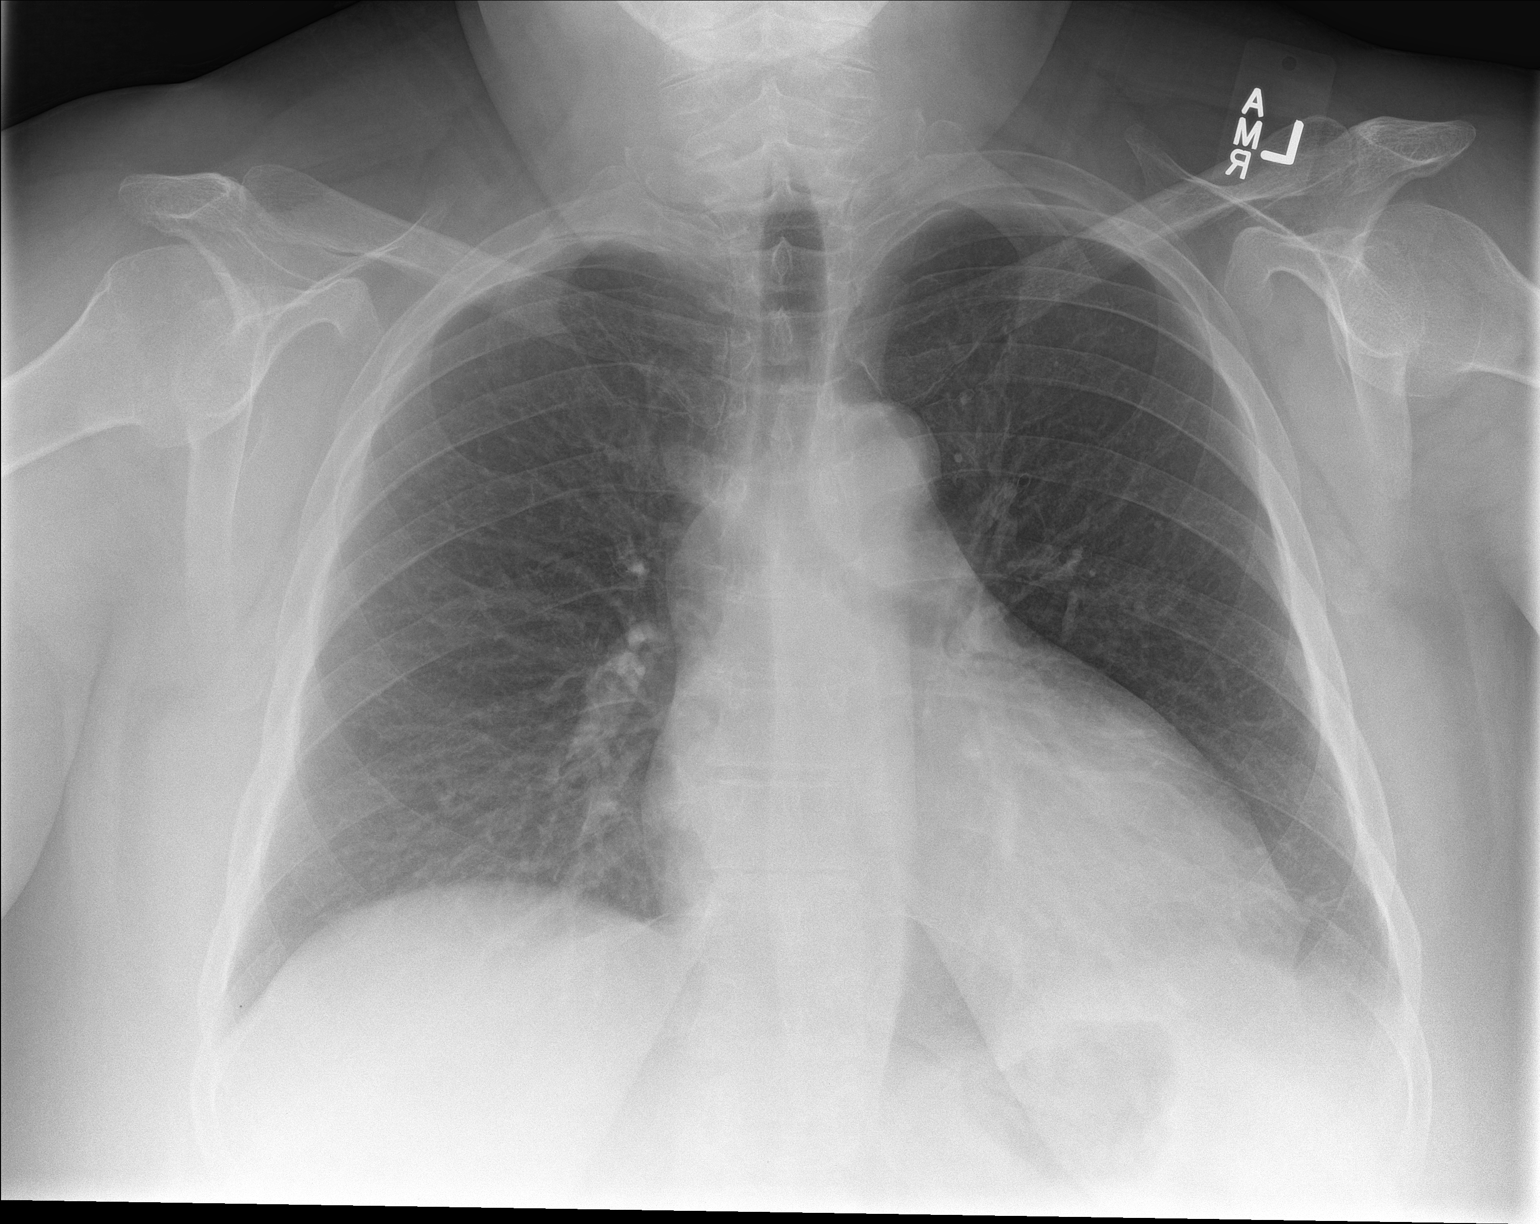

[chest lat]
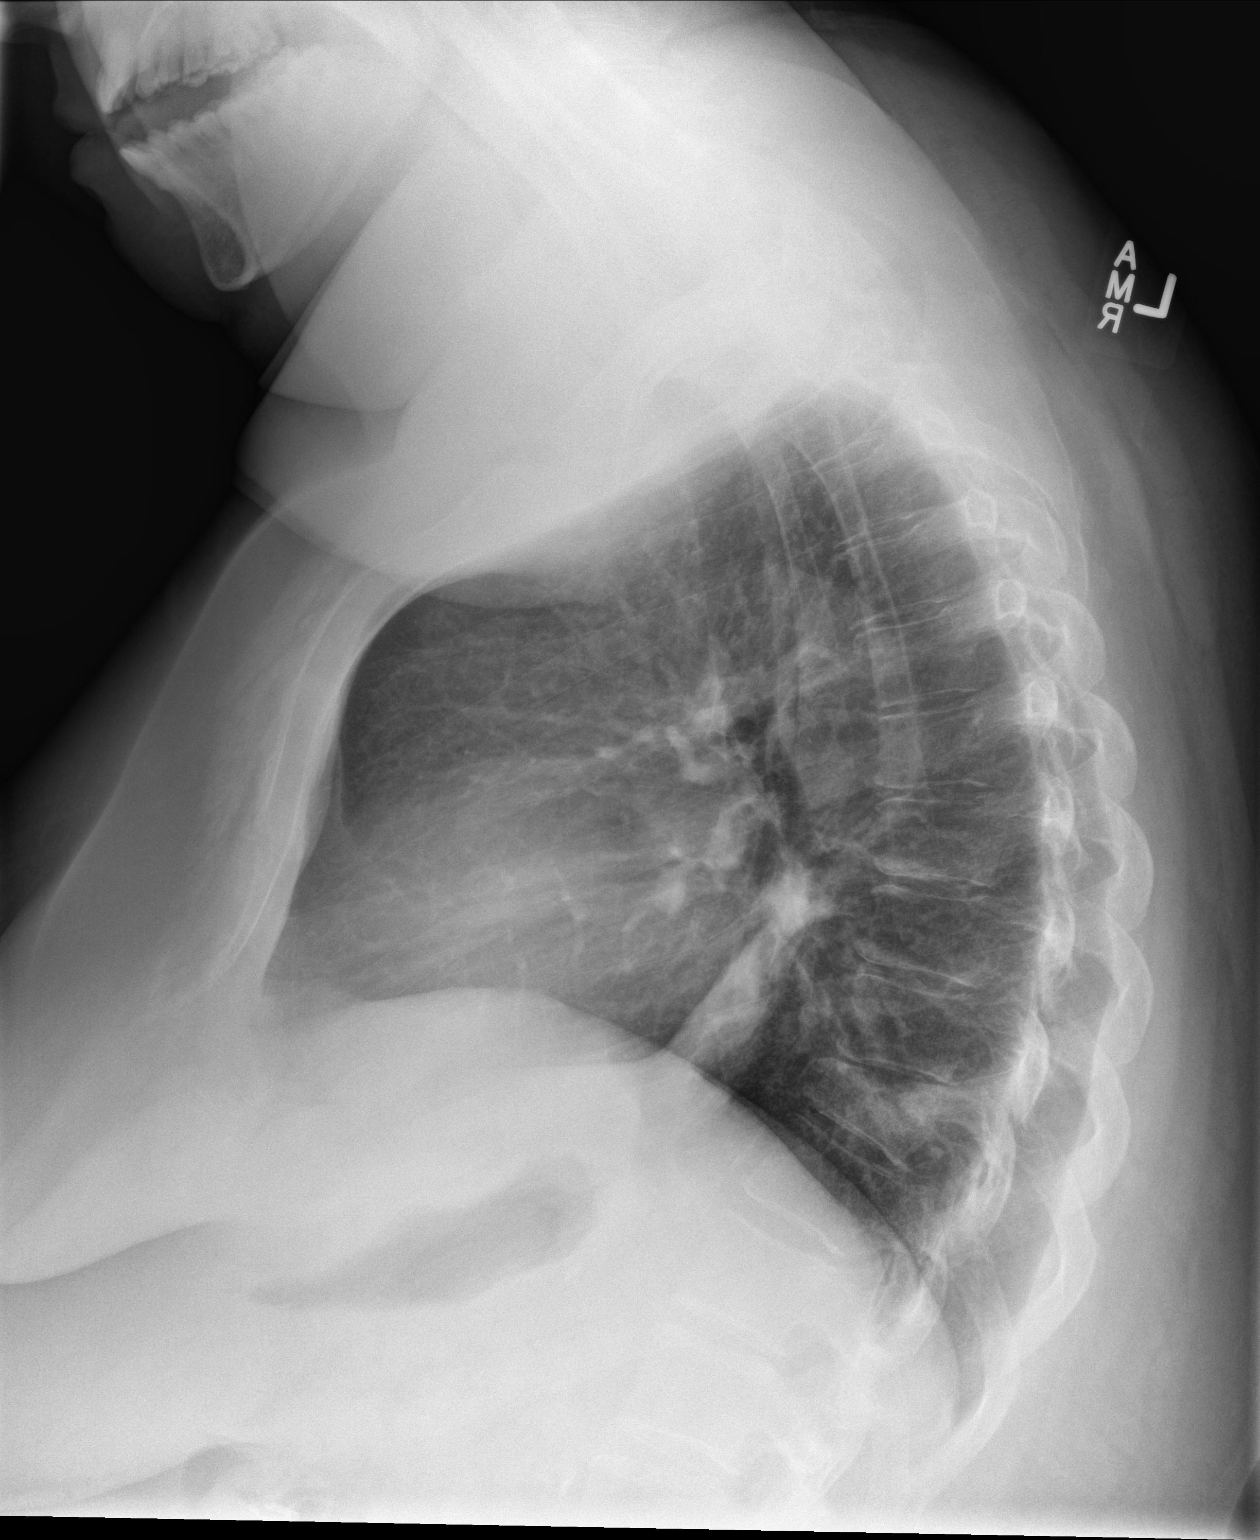

[2 of 2 positions shown; findings below may reference images not displayed]

FINDINGS: The cardiac silhouette remains mildly enlarged. Stable left lower
lobe subsegmental atelectasis. Mildly progressive compression
deformity of a lower thoracic vertebral body with progressive
sclerosis.
IMPRESSION: 1. Stable left lower lobe atelectasis.
2. Mildly progressive compression deformity of the T10 vertebral
body with progressive sclerosis.
3. Stable mild cardiomegaly.

## 2020-02-16 ENCOUNTER — Other Ambulatory Visit: Payer: Self-pay | Admitting: "Endocrinology

## 2020-02-20 ENCOUNTER — Ambulatory Visit (INDEPENDENT_AMBULATORY_CARE_PROVIDER_SITE_OTHER): Payer: Medicare Other | Admitting: Student

## 2020-02-20 ENCOUNTER — Telehealth: Payer: Self-pay | Admitting: Student

## 2020-02-20 ENCOUNTER — Encounter: Payer: Self-pay | Admitting: Student

## 2020-02-20 ENCOUNTER — Other Ambulatory Visit: Payer: Self-pay

## 2020-02-20 VITALS — BP 132/80 | HR 58 | Ht 64.0 in | Wt 251.0 lb

## 2020-02-20 DIAGNOSIS — U071 COVID-19: Secondary | ICD-10-CM | POA: Diagnosis not present

## 2020-02-20 DIAGNOSIS — I5032 Chronic diastolic (congestive) heart failure: Secondary | ICD-10-CM | POA: Diagnosis not present

## 2020-02-20 DIAGNOSIS — I2510 Atherosclerotic heart disease of native coronary artery without angina pectoris: Secondary | ICD-10-CM

## 2020-02-20 DIAGNOSIS — N1832 Chronic kidney disease, stage 3b: Secondary | ICD-10-CM | POA: Diagnosis not present

## 2020-02-20 DIAGNOSIS — I10 Essential (primary) hypertension: Secondary | ICD-10-CM

## 2020-02-20 DIAGNOSIS — Z20828 Contact with and (suspected) exposure to other viral communicable diseases: Secondary | ICD-10-CM | POA: Diagnosis not present

## 2020-02-20 DIAGNOSIS — I480 Paroxysmal atrial fibrillation: Secondary | ICD-10-CM

## 2020-02-20 MED ORDER — GUAIFENESIN ER 600 MG PO TB12: 600.0000 mg | ORAL_TABLET | Freq: Two times a day (BID) | ORAL | 11 refills | Status: DC | PRN

## 2020-02-20 NOTE — Progress Notes (Signed)
Cardiology Office Note    Date:  02/20/2020   ID:  [name], DOB 08-01-1955, MRN [id number]  PCP:  Rosita Fire, MD  Cardiologist: Rozann Lesches, MD    Chief Complaint  Patient presents with  . Follow-up    2 month visit    History of Present Illness:    [name] is a 64 y.o. female with past medical history of paroxysmal atrial fibrillation/flutter, history of VT (occurring in the setting of hyperkalemia), chronic diastolic CHF, HTN, Stage III CKD and OSA (not on CPAP) who presents to the office today for 90-month follow-up.  She was examined by Cecilie Kicks, NP in 11/2019 and reported occasional episodes of palpitations but heart rate was in the 50's during her visit. Her rate-control medications were not increased given prior episodes of bradycardia and heart rate into the 30's. She was continued on Cardizem CD 240 mg daily along with Lopressor 50 mg twice daily.  In talking with the patient and her caregiver from [facility] today, she reports overall doing well since her last office visit. She denies any recent palpitations and is unable to recall the last time this bothered her. Heart rate is well controlled in the 50's during today's visit and she denies any dizziness or presyncope. She reports intermittent fatigue during the day but says she does not sleep well at night due to frequent episodes of urination. She alternates taking Lasix 40 mg daily with 40 mg twice daily and on the days she takes this as twice daily dosing, her afternoon dose is typically around 1600-1700. We discussed taking this earlier in the day but she wishes to continue her current regimen at this time. She is also on Metolazone per Nephrology.   She denies any recent chest pain, orthopnea, PND or lower extremity edema. She does develop intermittent blisters along her lower extremities but says these spontaneously resolve. She has been experiencing more congestion with phlegm production but is  unaware if anything has been administered for this.   Past Medical History:  Diagnosis Date  . Arthritis   . Asthma   . Atrial fibrillation ([place])   . Chronic diastolic CHF (congestive heart failure) ([place])   . CKD (chronic kidney disease), stage III ([place])   . Diabetes mellitus type 2 in obese ([place])   . Hypertension   . Iron deficiency anemia 10/16/2010  . Mental handicap 10/16/2010  . Mild CAD 2013  . Morbid obesity ([place])   . OSA (obstructive sleep apnea)     Past Surgical History:  Procedure Laterality Date  . ABDOMINAL HYSTERECTOMY    . AV FISTULA PLACEMENT Right 06/06/2018   Procedure: ARTERIOVENOUS (AV) FISTULA CREATION;  Surgeon: Marty Heck, MD;  Location: [facility];  Service: Vascular;  Laterality: Right;  . CESAREAN SECTION    . CHOLECYSTECTOMY    . COLONOSCOPY  08/2010   normal TI, sigmoid polyp (adenoma ). Next TCS due  08/2015,  . ESOPHAGOGASTRODUODENOSCOPY  08/2010   antral and duodenal erosions s/p bx (chronic gastritis, no h.pylori, no celiac dz ), hiatal hernia  . FISTULA SUPERFICIALIZATION Right 08/25/2018   Procedure: FISTULA SUPERFICIALIZATION RIGHT ARM;  Surgeon: Waynetta Sandy, MD;  Location: [facility];  Service: Cardiovascular;  Laterality: Right;  . IR FLUORO GUIDE CV LINE RIGHT  05/31/2018  . IR US GUIDE VASC ACCESS RIGHT  05/31/2018  . KNEE SURGERY     right knee @ 64 years of age  . LEFT AND RIGHT  HEART CATHETERIZATION WITH CORONARY ANGIOGRAM N/A 09/21/2011   Procedure: LEFT AND RIGHT HEART CATHETERIZATION WITH CORONARY ANGIOGRAM;  Surgeon: Birdie Riddle, MD;  Location: [facility] CATH [facility];  Service: Cardiovascular;  Laterality: N/A;    Current Medications: Outpatient Medications Prior to Visit  Medication Sig Dispense Refill  . acetaminophen (TYLENOL) 325 MG tablet Take 325 mg by mouth every 12 (twelve) hours as needed for moderate pain.    Marland Kitchen albuterol (PROVENTIL HFA;VENTOLIN HFA) 108 (90 Base) MCG/ACT inhaler Inhale 2 puffs into the lungs every 6 (six)  hours as needed for shortness of breath.    Marland Kitchen apixaban (ELIQUIS) 5 MG TABS tablet Take 1 tablet (5 mg total) by mouth 2 (two) times daily. 60 tablet 0  . betamethasone dipropionate (DIPROLENE) 0.05 % ointment Apply 1 application topically daily as needed (for rash under breasts).     . cetirizine (ZYRTEC) 10 MG tablet Take 10 mg by mouth daily.    . clotrimazole (LOTRIMIN) 1 % cream Apply 1 application topically daily as needed (under breasts as needed for rash).    . clotrimazole-betamethasone (LOTRISONE) cream Apply 1 application topically 2 (two) times daily. Applied under breasts    . DIABETIC TUSSIN EX 100 MG/5ML syrup Take 100 mg by mouth every 4 (four) hours as needed for cough or congestion.     Marland Kitchen diltiazem (CARDIZEM CD) 240 MG 24 hr capsule Take 1 capsule (240 mg total) by mouth daily. 30 capsule 0  . fluticasone (FLONASE) 50 MCG/ACT nasal spray Place 2 sprays into both nostrils daily as needed for allergies or rhinitis.    . Fluticasone-Umeclidin-Vilant (TRELEGY ELLIPTA) 100-62.5-25 MCG/INH AEPB Inhale 1 puff into the lungs daily.     . furosemide (LASIX) 40 MG tablet Take 40 mg by mouth See admin instructions. Take one tablet by mouth TWICE DAILY ALTERNATING with taking one tablet by mouth ONCE DAILY    . HUMULIN R U-500 KWIKPEN 500 UNIT/ML kwikpen INJECT 30 units SUBCUTANEOUSLY 3 TIMES DAILY WITH MEALS WHEN BLOOD SUGAR IS GREATER THAN 90mg /dL. 15 mL 0  . hydrALAZINE (APRESOLINE) 25 MG tablet Take 1 tablet (25 mg total) by mouth every 8 (eight) hours for 30 days. (Patient taking differently: Take 25 mg by mouth 3 (three) times daily. ) 90 tablet 0  . ipratropium-albuterol (DUONEB) 0.5-2.5 (3) MG/3ML SOLN Take 3 mLs by nebulization every 6 (six) hours as needed (for cough).     . isosorbide dinitrate (ISORDIL) 10 MG tablet Take 1 tablet (10 mg total) by mouth 3 (three) times daily. 90 tablet 0  . Lancets 28G MISC USE TO CHECK BLOOD SUGAR 3 TIMES DAILY. 100 each 0  . metolazone (ZAROXOLYN) 5  MG tablet Take 5 mg by mouth daily.     . metoprolol tartrate (LOPRESSOR) 100 MG tablet Take 0.5 tablets (50 mg total) by mouth 2 (two) times daily.    . montelukast (SINGULAIR) 10 MG tablet Take 10 mg by mouth at bedtime.     . polyethylene glycol powder (GLYCOLAX/MIRALAX) 17 GM/SCOOP powder Take 17 g by mouth daily as needed for mild constipation or moderate constipation.    . potassium chloride SA (KLOR-CON) 20 MEQ tablet Take 20 mEq by mouth 2 (two) times daily.    . rosuvastatin (CRESTOR) 20 MG tablet TAKE (1) TABLET BY MOUTH ONCE DAILY. 30 tablet 3  . triamcinolone cream (KENALOG) 0.5 % Apply 1 application topically every 12 (twelve) hours as needed (applied to bilateral legs for allergic dermatitis).     Marland Kitchen  Vitamin D, Cholecalciferol, 25 MCG (1000 UT) TABS Take 1,000 Units by mouth daily.      No facility-administered medications prior to visit.     Allergies:   Patient has no known allergies.   Social History   Socioeconomic History  . Marital status: Divorced    Spouse name: Not on file  . Number of children: 1  . Years of education: Not on file  . Highest education level: Not on file  Occupational History    Employer: UNEMPLOYED  Tobacco Use  . Smoking status: Former Smoker    Packs/day: 1.00    Years: 0.00    Pack years: 0.00    Types: Cigarettes    Quit date: 04/11/1996    Years since quitting: 23.8  . Smokeless tobacco: Never Used  . Tobacco comment: quit a couple year ago  Vaping Use  . Vaping Use: Never used  Substance and Sexual Activity  . Alcohol use: No  . Drug use: No  . Sexual activity: Never  Other Topics Concern  . Not on file  Social History Narrative   Lives with parents.    Social Determinants of Health   Financial Resource Strain:   . Difficulty of Paying Living Expenses: Not on file  Food Insecurity:   . Worried About Charity fundraiser in the Last Year: Not on file  . Ran Out of Food in the Last Year: Not on file  Transportation Needs:     . Lack of Transportation (Medical): Not on file  . Lack of Transportation (Non-Medical): Not on file  Physical Activity:   . Days of Exercise per Week: Not on file  . Minutes of Exercise per Session: Not on file  Stress:   . Feeling of Stress : Not on file  Social Connections:   . Frequency of Communication with Friends and Family: Not on file  . Frequency of Social Gatherings with Friends and Family: Not on file  . Attends Religious Services: Not on file  . Active Member of Clubs or Organizations: Not on file  . Attends Archivist Meetings: Not on file  . Marital Status: Not on file     Family History:  The patient's family history includes Atrial fibrillation in her father; Diabetes in her mother; Heart failure in her mother; Hypertension in her father, mother, and son; Kidney disease in an other family member.   Review of Systems:   Please see the history of present illness.     General:  No chills, fever, night sweats or weight changes.  Cardiovascular:  No chest pain, orthopnea, paroxysmal nocturnal dyspnea. Positive for palpitations (now resolved).  Dermatological: No rash, lesions/masses Respiratory: Positive for phlegm and congestion.  Urologic: No hematuria, dysuria Abdominal:   No nausea, vomiting, diarrhea, bright red blood per rectum, melena, or hematemesis Neurologic:  No visual changes, wkns, changes in mental status. All other systems reviewed and are otherwise negative except as noted above.   Physical Exam:    VS:  BP 132/80   Pulse (!) 58   Ht 5\' 4"  (1.626 m)   Wt 251 lb (113.9 kg)   SpO2 98%   BMI 43.08 kg/m    General: Well developed, well nourished,female appearing in no acute distress. Head: Normocephalic, atraumatic. Neck: No carotid bruits. JVD not elevated.  Lungs: Respirations regular and unlabored, without wheezes or rales.  Heart: Regular rate and rhythm. No S3 or S4.  No murmur, no rubs, or gallops appreciated. Abdomen:  Appears  non-distended. No obvious abdominal masses. Msk:  Strength and tone appear normal for age. No obvious joint deformities or effusions. Extremities: No clubbing or cyanosis. Trace ankle edema bilaterally. Small blister along left shin with no erythema or current drainage noted.  Distal pedal pulses are 2+ bilaterally. Neuro: Alert and oriented X 3. Moves all extremities spontaneously. No focal deficits noted. Psych:  Responds to questions appropriately with a normal affect. Skin: No rashes or lesions noted  Wt Readings from Last 3 Encounters:  02/20/20 251 lb (113.9 kg)  01/22/20 254 lb (115.2 kg)  12/18/19 252 lb 12.8 oz (114.7 kg)     Studies/Labs Reviewed:   EKG:  EKG is not ordered today.   Recent Labs: 09/19/2019: ALT 23; Magnesium 2.0; TSH 2.446 09/20/2019: Hemoglobin 10.1; Platelets 228 10/16/2019: BUN 52; Creatinine, Ser 1.59; Potassium 3.5; Sodium 136   Lipid Panel    Component Value Date/Time   CHOL 191 09/12/2019 0831   TRIG 201 (H) 09/12/2019 0831   HDL 35 (L) 09/12/2019 0831   CHOLHDL 5.5 (H) 09/12/2019 0831   VLDL 59 (H) 06/02/2018 0029   LDLCALC 124 (H) 09/12/2019 0831    Additional studies/ records that were reviewed today include:   Echocardiogram: 04/2018 IMPRESSIONS    1. The left ventricle has hyperdynamic systolic function, with an  ejection fraction of >65%. The cavity size was normal. Left ventricular  diastolic Doppler parameters are consistent with impaired relaxation.  2. The right ventricle has normal systolic function. The cavity was  normal. There is no increase in right ventricular wall thickness.  3. The mitral valve is normal in structure.  4. The tricuspid valve is normal in structure.  5. The aortic valve is tricuspid Mild thickening of the aortic valve Mild  calcification of the aortic valve.  6. There is a small mobile density (1.1 x 0.5cm) on the ventricular  surface of the aortic valve. Challenging to visualize. Consider TEE if    clinically warranted.  7. The pulmonic valve was normal in structure.   TEE: 04/2018 IMPRESSIONS    1. Calcified, heterogeneous, primarily immobile lesion on the left  coronary cusp, appears to be primarily on the ventricular aspect of the  left coronary cusp. In clip 33 where it is well seen it measures 0.98 cm x  0.56 cm. The lesion has the appearance  of a subacute or healed vegetation. Differential also includes calcified  nodule in the setting of chronic dialysis.  2. The left ventricle has normal systolic function, with an ejection  fraction of 60-65%. The cavity size was normal.  3. The right ventricle has normal systolc function. The cavity was  normal. There is no increase in right ventricular wall thickness.  4. The aortic valve is tricuspid. Aortic valve regurgitation is trivial  by color flow Doppler.  5. The tricuspid valve was normal in structure.  6. The pulmonic valve was normal in structure.  7. There is evidence of severe atherosclerotic plaque in the aortic arch  and descending aorta.  8. Tiny patent foramen ovale with left to right shunting across the  atrial septum by color flow Doppler.  9. The interatrial septum appears to be lipomatous.  10. No left atrial appendage thrombus.    Assessment:    1. Paroxysmal atrial fibrillation (HCC)   2. Chronic diastolic CHF (congestive heart failure) ([place])   3. Essential hypertension   4. Stage 3b chronic kidney disease ([place])      Plan:  In order of problems listed above:  1. Paroxysmal Atrial Fibrillation - She denies any recent palpitations and heart rate is well controlled in the 50's during today's visit. Will continue current rate control regimen with Lopressor 50 mg twice daily and Cardizem CD 240 mg daily.  Would not further titrate her regimen given prior bradycardia with higher dosing in the past. - She denies any evidence of active bleeding. Remains on Eliquis 5 mg twice daily for  anticoagulation. Labs were obtained by Nephrology last month and showed that her hemoglobin was stable at 10.3 by review of Care Everywhere.  2. Chronic Diastolic CHF - She denies any recent orthopnea, PND or edema. She does have intermittent water blisters along her lower extremities but this is mild by examination today and no evidence of infection. - Her diuretic dosing has been managed by nephrology given her Stage III CKD and prior advanced CKD requiring intermittent hemodialysis in 2020.  She remains on Lasix 40 mg daily alternating with 40 mg twice daily along with taking Metolazone 5 mg daily. She does report having some phlegm given the recent temperature changes. I did recommend that she could take OTC Mucinex as needed.  3.  HTN - BP was initially recorded at 153/67, rechecked and improved to 132/80. This has been well controlled when checked at ALF. Continue current medication regimen with Cardizem CD 240 mg daily, Hydralazine 25 mg TID, Isordil 10 mg TID and Lopressor 50 mg twice daily.  4. Stage 3 CKD - Followed by Dr. Theador Hawthorne. Creatinine was stable at 1.63 by recent labs.    Medication Adjustments/Labs and Tests Ordered: Current medicines are reviewed at length with the patient today.  Concerns regarding medicines are outlined above.  Medication changes, Labs and Tests ordered today are listed in the Patient Instructions below. Patient Instructions  Medication Instructions:  You can use Mucinex Over the counter as needed.  *If you need a refill on your cardiac medications before your next appointment, please call your pharmacy*   Lab Work: None today If you have labs (blood work) drawn today and your tests are completely normal, you will receive your results only by: Marland Kitchen MyChart Message (if you have MyChart) OR . A paper copy in the mail If you have any lab test that is abnormal or we need to change your treatment, we will call you to review the  results.   Testing/Procedures: None today   Follow-Up: At [name], you and your health needs are our priority.  As part of our continuing mission to provide you with exceptional heart care, we have created designated Provider Care Teams.  These Care Teams include your primary Cardiologist (physician) and Advanced Practice Providers (APPs -  Physician Assistants and Nurse Practitioners) who all work together to provide you with the care you need, when you need it.  We recommend signing up for the patient portal called "MyChart".  Sign up information is provided on this After Visit Summary.  MyChart is used to connect with patients for Virtual Visits (Telemedicine).  Patients are able to view lab/test results, encounter notes, upcoming appointments, etc.  Non-urgent messages can be sent to your provider as well.   To learn more about what you can do with MyChart, go to [email or web address].    Your next appointment:   6 month(s)  The format for your next appointment:   In Person  Provider:   Rozann Lesches, MD   Other Instructions None   Thank you for choosing  [facility] !         Signed, Erma Heritage, PA-C  02/20/2020 2:14 PM    [facility] [address] [place], [place] [address] Phone: [phone] Fax: [phone]

## 2020-02-20 NOTE — Telephone Encounter (Signed)
Rx for Mucinex sent in

## 2020-02-20 NOTE — Patient Instructions (Signed)
Medication Instructions:  You can use Mucinex Over the counter as needed.  *If you need a refill on your cardiac medications before your next appointment, please call your pharmacy*   Lab Work: None today If you have labs (blood work) drawn today and your tests are completely normal, you will receive your results only by: Marland Kitchen MyChart Message (if you have MyChart) OR . A paper copy in the mail If you have any lab test that is abnormal or we need to change your treatment, we will call you to review the results.   Testing/Procedures: None today   Follow-Up: At [facility], you and your health needs are our priority.  As part of our continuing mission to provide you with exceptional heart care, we have created designated Provider Care Teams.  These Care Teams include your primary Cardiologist (physician) and Advanced Practice Providers (APPs -  Physician Assistants and Nurse Practitioners) who all work together to provide you with the care you need, when you need it.  We recommend signing up for the patient portal called "MyChart".  Sign up information is provided on this After Visit Summary.  MyChart is used to connect with patients for Virtual Visits (Telemedicine).  Patients are able to view lab/test results, encounter notes, upcoming appointments, etc.  Non-urgent messages can be sent to your provider as well.   To learn more about what you can do with MyChart, go to [email or web address].    Your next appointment:   6 month(s)  The format for your next appointment:   In Person  Provider:   Rozann Lesches, MD   Other Instructions None       Thank you for choosing [facility] !

## 2020-02-20 NOTE — Telephone Encounter (Signed)
New message    Rx care called they need strength of Mucinex that the patient can take and what dosage, since pt is in nursing facility it has to be a prescription

## 2020-02-20 NOTE — Telephone Encounter (Signed)
     Can provide Rx for Mucinex 600mg  twice daily as needed for sinus congestion/phlegm. Should be plain Mucinex and not Mucinex-DM.   Signed, Erma Heritage, PA-C 02/20/2020, 4:48 PM Pager: [phone]

## 2020-02-27 DIAGNOSIS — U071 COVID-19: Secondary | ICD-10-CM | POA: Diagnosis not present

## 2020-02-27 DIAGNOSIS — Z20828 Contact with and (suspected) exposure to other viral communicable diseases: Secondary | ICD-10-CM | POA: Diagnosis not present

## 2020-02-29 ENCOUNTER — Other Ambulatory Visit: Payer: Self-pay | Admitting: "Endocrinology

## 2020-02-29 DIAGNOSIS — U071 COVID-19: Secondary | ICD-10-CM | POA: Diagnosis not present

## 2020-02-29 DIAGNOSIS — Z20828 Contact with and (suspected) exposure to other viral communicable diseases: Secondary | ICD-10-CM | POA: Diagnosis not present

## 2020-03-04 ENCOUNTER — Ambulatory Visit (HOSPITAL_COMMUNITY): Payer: Medicare Other

## 2020-03-07 ENCOUNTER — Ambulatory Visit (HOSPITAL_COMMUNITY)
Admission: RE | Admit: 2020-03-07 | Discharge: 2020-03-07 | Disposition: A | Discharge status: Home / Self Care | Payer: Medicare Other | Source: Ambulatory Visit | Attending: Internal Medicine | Admitting: Internal Medicine

## 2020-03-07 ENCOUNTER — Other Ambulatory Visit: Payer: Self-pay

## 2020-03-07 DIAGNOSIS — U071 COVID-19: Secondary | ICD-10-CM | POA: Diagnosis not present

## 2020-03-07 DIAGNOSIS — Z20828 Contact with and (suspected) exposure to other viral communicable diseases: Secondary | ICD-10-CM | POA: Diagnosis not present

## 2020-03-07 DIAGNOSIS — Z1231 Encounter for screening mammogram for malignant neoplasm of breast: Secondary | ICD-10-CM | POA: Diagnosis not present

## 2020-03-12 DIAGNOSIS — U071 COVID-19: Secondary | ICD-10-CM | POA: Diagnosis not present

## 2020-03-12 DIAGNOSIS — Z20828 Contact with and (suspected) exposure to other viral communicable diseases: Secondary | ICD-10-CM | POA: Diagnosis not present

## 2020-03-14 DIAGNOSIS — Z20828 Contact with and (suspected) exposure to other viral communicable diseases: Secondary | ICD-10-CM | POA: Diagnosis not present

## 2020-03-14 DIAGNOSIS — U071 COVID-19: Secondary | ICD-10-CM | POA: Diagnosis not present

## 2020-03-15 DIAGNOSIS — E1122 Type 2 diabetes mellitus with diabetic chronic kidney disease: Secondary | ICD-10-CM | POA: Diagnosis not present

## 2020-03-15 DIAGNOSIS — I5032 Chronic diastolic (congestive) heart failure: Secondary | ICD-10-CM | POA: Diagnosis not present

## 2020-03-19 DIAGNOSIS — Z20828 Contact with and (suspected) exposure to other viral communicable diseases: Secondary | ICD-10-CM | POA: Diagnosis not present

## 2020-03-19 DIAGNOSIS — U071 COVID-19: Secondary | ICD-10-CM | POA: Diagnosis not present

## 2020-03-20 ENCOUNTER — Other Ambulatory Visit: Payer: Self-pay | Admitting: "Endocrinology

## 2020-03-21 DIAGNOSIS — Z20828 Contact with and (suspected) exposure to other viral communicable diseases: Secondary | ICD-10-CM | POA: Diagnosis not present

## 2020-03-21 DIAGNOSIS — J449 Chronic obstructive pulmonary disease, unspecified: Secondary | ICD-10-CM | POA: Diagnosis not present

## 2020-03-21 DIAGNOSIS — U071 COVID-19: Secondary | ICD-10-CM | POA: Diagnosis not present

## 2020-03-21 DIAGNOSIS — E1122 Type 2 diabetes mellitus with diabetic chronic kidney disease: Secondary | ICD-10-CM | POA: Diagnosis not present

## 2020-03-21 DIAGNOSIS — I5032 Chronic diastolic (congestive) heart failure: Secondary | ICD-10-CM | POA: Diagnosis not present

## 2020-03-26 DIAGNOSIS — U071 COVID-19: Secondary | ICD-10-CM | POA: Diagnosis not present

## 2020-03-26 DIAGNOSIS — Z20828 Contact with and (suspected) exposure to other viral communicable diseases: Secondary | ICD-10-CM | POA: Diagnosis not present

## 2020-03-27 ENCOUNTER — Other Ambulatory Visit: Payer: Self-pay | Admitting: "Endocrinology

## 2020-03-28 DIAGNOSIS — Z20828 Contact with and (suspected) exposure to other viral communicable diseases: Secondary | ICD-10-CM | POA: Diagnosis not present

## 2020-03-28 DIAGNOSIS — U071 COVID-19: Secondary | ICD-10-CM | POA: Diagnosis not present

## 2020-04-02 DIAGNOSIS — I5032 Chronic diastolic (congestive) heart failure: Secondary | ICD-10-CM | POA: Diagnosis not present

## 2020-04-02 DIAGNOSIS — R809 Proteinuria, unspecified: Secondary | ICD-10-CM | POA: Diagnosis not present

## 2020-04-02 DIAGNOSIS — N189 Chronic kidney disease, unspecified: Secondary | ICD-10-CM | POA: Diagnosis not present

## 2020-04-02 DIAGNOSIS — D631 Anemia in chronic kidney disease: Secondary | ICD-10-CM | POA: Diagnosis not present

## 2020-04-02 DIAGNOSIS — I10 Essential (primary) hypertension: Secondary | ICD-10-CM | POA: Diagnosis not present

## 2020-04-02 DIAGNOSIS — E211 Secondary hyperparathyroidism, not elsewhere classified: Secondary | ICD-10-CM | POA: Diagnosis not present

## 2020-04-02 DIAGNOSIS — Z20828 Contact with and (suspected) exposure to other viral communicable diseases: Secondary | ICD-10-CM | POA: Diagnosis not present

## 2020-04-02 DIAGNOSIS — E1129 Type 2 diabetes mellitus with other diabetic kidney complication: Secondary | ICD-10-CM | POA: Diagnosis not present

## 2020-04-02 DIAGNOSIS — E1122 Type 2 diabetes mellitus with diabetic chronic kidney disease: Secondary | ICD-10-CM | POA: Diagnosis not present

## 2020-04-02 DIAGNOSIS — U071 COVID-19: Secondary | ICD-10-CM | POA: Diagnosis not present

## 2020-04-03 ENCOUNTER — Ambulatory Visit: Payer: Medicare Other | Admitting: Gastroenterology

## 2020-04-04 DIAGNOSIS — N189 Chronic kidney disease, unspecified: Secondary | ICD-10-CM | POA: Diagnosis not present

## 2020-04-04 DIAGNOSIS — R809 Proteinuria, unspecified: Secondary | ICD-10-CM | POA: Diagnosis not present

## 2020-04-04 DIAGNOSIS — U071 COVID-19: Secondary | ICD-10-CM | POA: Diagnosis not present

## 2020-04-04 DIAGNOSIS — E211 Secondary hyperparathyroidism, not elsewhere classified: Secondary | ICD-10-CM | POA: Diagnosis not present

## 2020-04-04 DIAGNOSIS — D631 Anemia in chronic kidney disease: Secondary | ICD-10-CM | POA: Diagnosis not present

## 2020-04-04 DIAGNOSIS — I10 Essential (primary) hypertension: Secondary | ICD-10-CM | POA: Diagnosis not present

## 2020-04-04 DIAGNOSIS — E1129 Type 2 diabetes mellitus with other diabetic kidney complication: Secondary | ICD-10-CM | POA: Diagnosis not present

## 2020-04-04 DIAGNOSIS — R278 Other lack of coordination: Secondary | ICD-10-CM | POA: Diagnosis not present

## 2020-04-04 DIAGNOSIS — E559 Vitamin D deficiency, unspecified: Secondary | ICD-10-CM | POA: Diagnosis not present

## 2020-04-04 DIAGNOSIS — E1122 Type 2 diabetes mellitus with diabetic chronic kidney disease: Secondary | ICD-10-CM | POA: Diagnosis not present

## 2020-04-04 DIAGNOSIS — I5032 Chronic diastolic (congestive) heart failure: Secondary | ICD-10-CM | POA: Diagnosis not present

## 2020-04-04 DIAGNOSIS — E876 Hypokalemia: Secondary | ICD-10-CM | POA: Diagnosis not present

## 2020-04-09 DIAGNOSIS — R278 Other lack of coordination: Secondary | ICD-10-CM | POA: Diagnosis not present

## 2020-04-11 DIAGNOSIS — R278 Other lack of coordination: Secondary | ICD-10-CM | POA: Diagnosis not present

## 2020-04-12 DIAGNOSIS — M79674 Pain in right toe(s): Secondary | ICD-10-CM | POA: Diagnosis not present

## 2020-04-12 DIAGNOSIS — B351 Tinea unguium: Secondary | ICD-10-CM | POA: Diagnosis not present

## 2020-04-12 DIAGNOSIS — M79675 Pain in left toe(s): Secondary | ICD-10-CM | POA: Diagnosis not present

## 2020-04-16 DIAGNOSIS — R278 Other lack of coordination: Secondary | ICD-10-CM | POA: Diagnosis not present

## 2020-04-18 DIAGNOSIS — I4821 Permanent atrial fibrillation: Secondary | ICD-10-CM | POA: Diagnosis not present

## 2020-04-18 DIAGNOSIS — N189 Chronic kidney disease, unspecified: Secondary | ICD-10-CM | POA: Diagnosis not present

## 2020-04-18 DIAGNOSIS — E559 Vitamin D deficiency, unspecified: Secondary | ICD-10-CM | POA: Diagnosis not present

## 2020-04-18 DIAGNOSIS — U071 COVID-19: Secondary | ICD-10-CM | POA: Diagnosis not present

## 2020-04-18 DIAGNOSIS — J441 Chronic obstructive pulmonary disease with (acute) exacerbation: Secondary | ICD-10-CM | POA: Diagnosis not present

## 2020-04-18 DIAGNOSIS — E876 Hypokalemia: Secondary | ICD-10-CM | POA: Diagnosis not present

## 2020-04-18 DIAGNOSIS — I1310 Hypertensive heart and chronic kidney disease without heart failure, with stage 1 through stage 4 chronic kidney disease, or unspecified chronic kidney disease: Secondary | ICD-10-CM | POA: Diagnosis not present

## 2020-04-18 DIAGNOSIS — E211 Secondary hyperparathyroidism, not elsewhere classified: Secondary | ICD-10-CM | POA: Diagnosis not present

## 2020-04-18 DIAGNOSIS — M6281 Muscle weakness (generalized): Secondary | ICD-10-CM | POA: Diagnosis not present

## 2020-04-18 DIAGNOSIS — I10 Essential (primary) hypertension: Secondary | ICD-10-CM | POA: Diagnosis not present

## 2020-04-18 DIAGNOSIS — E1129 Type 2 diabetes mellitus with other diabetic kidney complication: Secondary | ICD-10-CM | POA: Diagnosis not present

## 2020-04-18 DIAGNOSIS — N183 Chronic kidney disease, stage 3 unspecified: Secondary | ICD-10-CM | POA: Diagnosis not present

## 2020-04-18 DIAGNOSIS — Z6841 Body Mass Index (BMI) 40.0 and over, adult: Secondary | ICD-10-CM | POA: Diagnosis not present

## 2020-04-18 DIAGNOSIS — E1122 Type 2 diabetes mellitus with diabetic chronic kidney disease: Secondary | ICD-10-CM | POA: Diagnosis not present

## 2020-04-18 DIAGNOSIS — I5032 Chronic diastolic (congestive) heart failure: Secondary | ICD-10-CM | POA: Diagnosis not present

## 2020-04-18 DIAGNOSIS — R809 Proteinuria, unspecified: Secondary | ICD-10-CM | POA: Diagnosis not present

## 2020-04-18 DIAGNOSIS — D631 Anemia in chronic kidney disease: Secondary | ICD-10-CM | POA: Diagnosis not present

## 2020-04-18 DIAGNOSIS — Z794 Long term (current) use of insulin: Secondary | ICD-10-CM | POA: Diagnosis not present

## 2020-04-18 DIAGNOSIS — R2681 Unsteadiness on feet: Secondary | ICD-10-CM | POA: Diagnosis not present

## 2020-04-20 DIAGNOSIS — N183 Chronic kidney disease, stage 3 unspecified: Secondary | ICD-10-CM | POA: Diagnosis not present

## 2020-04-20 DIAGNOSIS — J441 Chronic obstructive pulmonary disease with (acute) exacerbation: Secondary | ICD-10-CM | POA: Diagnosis not present

## 2020-04-20 DIAGNOSIS — E1122 Type 2 diabetes mellitus with diabetic chronic kidney disease: Secondary | ICD-10-CM | POA: Diagnosis not present

## 2020-04-20 DIAGNOSIS — U071 COVID-19: Secondary | ICD-10-CM | POA: Diagnosis not present

## 2020-04-20 DIAGNOSIS — I5032 Chronic diastolic (congestive) heart failure: Secondary | ICD-10-CM | POA: Diagnosis not present

## 2020-04-20 DIAGNOSIS — I1310 Hypertensive heart and chronic kidney disease without heart failure, with stage 1 through stage 4 chronic kidney disease, or unspecified chronic kidney disease: Secondary | ICD-10-CM | POA: Diagnosis not present

## 2020-04-21 DIAGNOSIS — J449 Chronic obstructive pulmonary disease, unspecified: Secondary | ICD-10-CM | POA: Diagnosis not present

## 2020-04-21 DIAGNOSIS — E1165 Type 2 diabetes mellitus with hyperglycemia: Secondary | ICD-10-CM | POA: Diagnosis not present

## 2020-04-22 ENCOUNTER — Other Ambulatory Visit: Payer: Self-pay | Admitting: "Endocrinology

## 2020-04-22 DIAGNOSIS — U071 COVID-19: Secondary | ICD-10-CM | POA: Diagnosis not present

## 2020-04-22 DIAGNOSIS — E1122 Type 2 diabetes mellitus with diabetic chronic kidney disease: Secondary | ICD-10-CM | POA: Diagnosis not present

## 2020-04-22 DIAGNOSIS — J441 Chronic obstructive pulmonary disease with (acute) exacerbation: Secondary | ICD-10-CM | POA: Diagnosis not present

## 2020-04-22 DIAGNOSIS — I1310 Hypertensive heart and chronic kidney disease without heart failure, with stage 1 through stage 4 chronic kidney disease, or unspecified chronic kidney disease: Secondary | ICD-10-CM | POA: Diagnosis not present

## 2020-04-22 DIAGNOSIS — N183 Chronic kidney disease, stage 3 unspecified: Secondary | ICD-10-CM | POA: Diagnosis not present

## 2020-04-22 DIAGNOSIS — I5032 Chronic diastolic (congestive) heart failure: Secondary | ICD-10-CM | POA: Diagnosis not present

## 2020-04-23 ENCOUNTER — Ambulatory Visit: Payer: Medicare Other | Admitting: "Endocrinology

## 2020-04-23 DIAGNOSIS — I1310 Hypertensive heart and chronic kidney disease without heart failure, with stage 1 through stage 4 chronic kidney disease, or unspecified chronic kidney disease: Secondary | ICD-10-CM | POA: Diagnosis not present

## 2020-04-23 DIAGNOSIS — E1122 Type 2 diabetes mellitus with diabetic chronic kidney disease: Secondary | ICD-10-CM | POA: Diagnosis not present

## 2020-04-23 DIAGNOSIS — J441 Chronic obstructive pulmonary disease with (acute) exacerbation: Secondary | ICD-10-CM | POA: Diagnosis not present

## 2020-04-23 DIAGNOSIS — U071 COVID-19: Secondary | ICD-10-CM | POA: Diagnosis not present

## 2020-04-23 DIAGNOSIS — N183 Chronic kidney disease, stage 3 unspecified: Secondary | ICD-10-CM | POA: Diagnosis not present

## 2020-04-23 DIAGNOSIS — I5032 Chronic diastolic (congestive) heart failure: Secondary | ICD-10-CM | POA: Diagnosis not present

## 2020-04-23 DIAGNOSIS — N184 Chronic kidney disease, stage 4 (severe): Secondary | ICD-10-CM | POA: Diagnosis not present

## 2020-04-23 DIAGNOSIS — Z794 Long term (current) use of insulin: Secondary | ICD-10-CM | POA: Diagnosis not present

## 2020-04-24 DIAGNOSIS — E559 Vitamin D deficiency, unspecified: Secondary | ICD-10-CM | POA: Diagnosis not present

## 2020-04-24 DIAGNOSIS — D631 Anemia in chronic kidney disease: Secondary | ICD-10-CM | POA: Diagnosis not present

## 2020-04-24 DIAGNOSIS — Z7984 Long term (current) use of oral hypoglycemic drugs: Secondary | ICD-10-CM | POA: Diagnosis not present

## 2020-04-24 DIAGNOSIS — I5033 Acute on chronic diastolic (congestive) heart failure: Secondary | ICD-10-CM | POA: Diagnosis not present

## 2020-04-24 DIAGNOSIS — E211 Secondary hyperparathyroidism, not elsewhere classified: Secondary | ICD-10-CM | POA: Diagnosis not present

## 2020-04-24 DIAGNOSIS — E876 Hypokalemia: Secondary | ICD-10-CM | POA: Diagnosis not present

## 2020-04-24 DIAGNOSIS — H25813 Combined forms of age-related cataract, bilateral: Secondary | ICD-10-CM | POA: Diagnosis not present

## 2020-04-24 DIAGNOSIS — N189 Chronic kidney disease, unspecified: Secondary | ICD-10-CM | POA: Diagnosis not present

## 2020-04-24 DIAGNOSIS — E1122 Type 2 diabetes mellitus with diabetic chronic kidney disease: Secondary | ICD-10-CM | POA: Diagnosis not present

## 2020-04-24 DIAGNOSIS — U071 COVID-19: Secondary | ICD-10-CM | POA: Diagnosis not present

## 2020-04-24 DIAGNOSIS — E119 Type 2 diabetes mellitus without complications: Secondary | ICD-10-CM | POA: Diagnosis not present

## 2020-04-24 DIAGNOSIS — Z794 Long term (current) use of insulin: Secondary | ICD-10-CM | POA: Diagnosis not present

## 2020-04-24 LAB — COMPREHENSIVE METABOLIC PANEL
ALT: 12 IU/L (ref 0–32)
AST: 15 IU/L (ref 0–40)
Albumin/Globulin Ratio: 1.4 (ref 1.2–2.2)
Albumin: 4 g/dL (ref 3.8–4.8)
Alkaline Phosphatase: 83 IU/L (ref 44–121)
BUN/Creatinine Ratio: 23 (ref 12–28)
BUN: 37 mg/dL — ABNORMAL HIGH (ref 8–27)
Bilirubin Total: 0.2 mg/dL (ref 0.0–1.2)
CO2: 26 mmol/L (ref 20–29)
Calcium: 9 mg/dL (ref 8.7–10.3)
Chloride: 100 mmol/L (ref 96–106)
Creatinine, Ser: 1.64 mg/dL — ABNORMAL HIGH (ref 0.57–1.00)
GFR calc Af Amer: 38 mL/min/{1.73_m2} — ABNORMAL LOW (ref >59)
GFR calc non Af Amer: 33 mL/min/{1.73_m2} — ABNORMAL LOW (ref >59)
Globulin, Total: 2.9 g/dL (ref 1.5–4.5)
Glucose: 188 mg/dL — ABNORMAL HIGH (ref 65–99)
Potassium: 4.5 mmol/L (ref 3.5–5.2)
Sodium: 140 mmol/L (ref 134–144)
Total Protein: 6.9 g/dL (ref 6.0–8.5)

## 2020-04-24 LAB — LIPID PANEL
Chol/HDL Ratio: 3 ratio (ref 0.0–4.4)
Cholesterol, Total: 128 mg/dL (ref 100–199)
HDL: 43 mg/dL (ref >39)
LDL Chol Calc (NIH): 60 mg/dL (ref 0–99)
Triglycerides: 147 mg/dL (ref 0–149)
VLDL Cholesterol Cal: 25 mg/dL (ref 5–40)

## 2020-04-24 LAB — T4, FREE: Free T4: 1.43 ng/dL (ref 0.82–1.77)

## 2020-04-24 LAB — TSH: TSH: 2.3 u[IU]/mL (ref 0.450–4.500)

## 2020-04-25 ENCOUNTER — Encounter: Payer: Self-pay | Admitting: "Endocrinology

## 2020-04-25 ENCOUNTER — Other Ambulatory Visit: Payer: Self-pay

## 2020-04-25 ENCOUNTER — Ambulatory Visit (INDEPENDENT_AMBULATORY_CARE_PROVIDER_SITE_OTHER): Payer: Medicare Other | Admitting: "Endocrinology

## 2020-04-25 VITALS — BP 122/76 | HR 96 | Ht 64.0 in | Wt 255.2 lb

## 2020-04-25 DIAGNOSIS — U071 COVID-19: Secondary | ICD-10-CM | POA: Diagnosis not present

## 2020-04-25 DIAGNOSIS — I5032 Chronic diastolic (congestive) heart failure: Secondary | ICD-10-CM | POA: Diagnosis not present

## 2020-04-25 DIAGNOSIS — E1122 Type 2 diabetes mellitus with diabetic chronic kidney disease: Secondary | ICD-10-CM | POA: Diagnosis not present

## 2020-04-25 DIAGNOSIS — I10 Essential (primary) hypertension: Secondary | ICD-10-CM

## 2020-04-25 DIAGNOSIS — N184 Chronic kidney disease, stage 4 (severe): Secondary | ICD-10-CM

## 2020-04-25 DIAGNOSIS — N183 Chronic kidney disease, stage 3 unspecified: Secondary | ICD-10-CM | POA: Diagnosis not present

## 2020-04-25 DIAGNOSIS — I1310 Hypertensive heart and chronic kidney disease without heart failure, with stage 1 through stage 4 chronic kidney disease, or unspecified chronic kidney disease: Secondary | ICD-10-CM | POA: Diagnosis not present

## 2020-04-25 DIAGNOSIS — Z794 Long term (current) use of insulin: Secondary | ICD-10-CM

## 2020-04-25 DIAGNOSIS — E782 Mixed hyperlipidemia: Secondary | ICD-10-CM

## 2020-04-25 DIAGNOSIS — J441 Chronic obstructive pulmonary disease with (acute) exacerbation: Secondary | ICD-10-CM | POA: Diagnosis not present

## 2020-04-25 LAB — POCT GLYCOSYLATED HEMOGLOBIN (HGB A1C): HbA1c, POC (controlled diabetic range): 7.6 % — AB (ref 0.0–7.0)

## 2020-04-25 LAB — POCT UA - MICROALBUMIN
Creatinine, POC: 200 mg/dL
Microalbumin Ur, POC: 150 mg/L

## 2020-04-25 MED ORDER — HUMULIN R U-500 KWIKPEN 500 UNIT/ML ~~LOC~~ SOPN: PEN_INJECTOR | SUBCUTANEOUS | 0 refills | Status: DC

## 2020-04-25 NOTE — Patient Instructions (Signed)
                                     Advice for Weight Management  -For most of us the best way to lose weight is by diet management. Generally speaking, diet management means consuming less calories intentionally which over time brings about progressive weight loss.  This can be achieved more effectively by restricting carbohydrate consumption to the minimum possible.  So, it is critically important to know your numbers: how much calorie you are consuming and how much calorie you need. More importantly, our carbohydrates sources should be unprocessed or minimally processed complex starch food items.   Sometimes, it is important to balance nutrition by increasing protein intake (animal or plant source), fruits, and vegetables.  -Sticking to a routine mealtime to eat 3 meals a day and avoiding unnecessary snacks is shown to have a big role in weight control. Under normal circumstances, the only time we lose real weight is when we are hungry, so allow hunger to take place- hunger means no food between meal times, only water.  It is not advisable to starve.   -It is better to avoid simple carbohydrates including: Cakes, Sweet Desserts, Ice Cream, Soda (diet and regular), Sweet Tea, Candies, Chips, Cookies, Store Bought Juices, Alcohol in Excess of  1-2 drinks a day, Lemonade,  Artificial Sweeteners, Doughnuts, Coffee Creamers, "Sugar-free" Products, etc, etc.  This is not a complete list.....    -Consulting with certified diabetes educators is proven to provide you with the most accurate and current information on diet.  Also, you may be  interested in discussing diet options/exchanges , we can schedule a visit with [name], RDN, CDE for individualized nutrition education.  -Exercise: If you are able: 30 -60 minutes a day ,4 days a week, or 150 minutes a week.  The longer the better.  Combine stretch, strength, and aerobic activities.  If you were told in the  past that you have high risk for cardiovascular diseases, you may seek evaluation by your heart doctor prior to initiating moderate to intense exercise programs.                                  Additional Care Considerations for Diabetes   -Diabetes  is a chronic disease.  The most important care consideration is regular follow-up with your diabetes care provider with the goal being avoiding or delaying its complications and to take advantage of advances in medications and technology.    -Type 2 diabetes is known to coexist with other important comorbidities such as high blood pressure and high cholesterol.  It is critical to control not only the diabetes but also the high blood pressure and high cholesterol to minimize and delay the risk of complications including coronary artery disease, stroke, amputations, blindness, etc.    - Studies showed that people with diabetes will benefit from a class of medications known as ACE inhibitors and statins.  Unless there are specific reasons not to be on these medications, the standard of care is to consider getting one from these groups of medications at an optimal doses.  These medications are generally considered safe and proven to help protect the heart and the kidneys.    - People with diabetes are encouraged to initiate and maintain regular follow-up with eye doctors, foot   doctors, dentists , and if necessary heart and kidney doctors.     - It is highly recommended that people with diabetes quit smoking or stay away from smoking, and get yearly  flu vaccine and pneumonia vaccine at least every 5 years.  One other important lifestyle recommendation is to ensure adequate sleep - at least 6-7 hours of uninterrupted sleep at night.  -Exercise: If you are able: 30 -60 minutes a day, 4 days a week, or 150 minutes a week.  The longer the better.  Combine stretch, strength, and aerobic activities.  If you were told in the past that you have high risk for  cardiovascular diseases, you may seek evaluation by your heart doctor prior to initiating moderate to intense exercise programs.          

## 2020-04-25 NOTE — Progress Notes (Signed)
04/25/2020  Endocrinology follow-up note   Subjective:    Patient ID: [name], female    DOB: 1955/11/19, PCP Rosita Fire, MD   Past Medical History:  Diagnosis Date  . Arthritis   . Asthma   . Atrial fibrillation ([place])   . Chronic diastolic CHF (congestive heart failure) ([place])   . CKD (chronic kidney disease), stage III ([place])   . Diabetes mellitus type 2 in obese ([place])   . Hypertension   . Iron deficiency anemia 10/16/2010  . Mental handicap 10/16/2010  . Mild CAD 2013  . Morbid obesity ([place])   . OSA (obstructive sleep apnea)    Past Surgical History:  Procedure Laterality Date  . ABDOMINAL HYSTERECTOMY    . AV FISTULA PLACEMENT Right 06/06/2018   Procedure: ARTERIOVENOUS (AV) FISTULA CREATION;  Surgeon: Marty Heck, MD;  Location: [facility];  Service: Vascular;  Laterality: Right;  . CESAREAN SECTION    . CHOLECYSTECTOMY    . COLONOSCOPY  08/2010   normal TI, sigmoid polyp (adenoma ). Next TCS due  08/2015,  . ESOPHAGOGASTRODUODENOSCOPY  08/2010   antral and duodenal erosions s/p bx (chronic gastritis, no h.pylori, no celiac dz ), hiatal hernia  . FISTULA SUPERFICIALIZATION Right 08/25/2018   Procedure: FISTULA SUPERFICIALIZATION RIGHT ARM;  Surgeon: Waynetta Sandy, MD;  Location: [facility];  Service: Cardiovascular;  Laterality: Right;  . IR FLUORO GUIDE CV LINE RIGHT  05/31/2018  . IR US GUIDE VASC ACCESS RIGHT  05/31/2018  . KNEE SURGERY     right knee @ 65 years of age  . LEFT AND RIGHT HEART CATHETERIZATION WITH CORONARY ANGIOGRAM N/A 09/21/2011   Procedure: LEFT AND RIGHT HEART CATHETERIZATION WITH CORONARY ANGIOGRAM;  Surgeon: Birdie Riddle, MD;  Location: [facility];  Service: Cardiovascular;  Laterality: N/A;   Social History   Socioeconomic History  . Marital status: Divorced    Spouse name: Not on file  . Number of children: 1  . Years of education: Not on file  . Highest education level: Not on file  Occupational History    Employer:  UNEMPLOYED  Tobacco Use  . Smoking status: Former Smoker    Packs/day: 1.00    Years: 0.00    Pack years: 0.00    Types: Cigarettes    Quit date: 04/11/1996    Years since quitting: 24.0  . Smokeless tobacco: Never Used  . Tobacco comment: quit a couple year ago  Vaping Use  . Vaping Use: Never used  Substance and Sexual Activity  . Alcohol use: No  . Drug use: No  . Sexual activity: Never  Other Topics Concern  . Not on file  Social History Narrative   Lives with parents.    Social Determinants of Health   Financial Resource Strain: Not on file  Food Insecurity: Not on file  Transportation Needs: Not on file  Physical Activity: Not on file  Stress: Not on file  Social Connections: Not on file   Outpatient Encounter Medications as of 04/25/2020  Medication Sig  . acetaminophen (TYLENOL) 325 MG tablet Take 325 mg by mouth every 12 (twelve) hours as needed for moderate pain.  Marland Kitchen albuterol (PROVENTIL HFA;VENTOLIN HFA) 108 (90 Base) MCG/ACT inhaler Inhale 2 puffs into the lungs every 6 (six) hours as needed for shortness of breath.  Marland Kitchen apixaban (ELIQUIS) 5 MG TABS tablet Take 1 tablet (5 mg total) by mouth 2 (two) times daily.  . betamethasone dipropionate (DIPROLENE) 0.05 % ointment  Apply 1 application topically daily as needed (for rash under breasts).   . cetirizine (ZYRTEC) 10 MG tablet Take 10 mg by mouth daily.  . clotrimazole (LOTRIMIN) 1 % cream Apply 1 application topically daily as needed (under breasts as needed for rash).  . clotrimazole-betamethasone (LOTRISONE) cream Apply 1 application topically 2 (two) times daily. Applied under breasts  . DIABETIC TUSSIN EX 100 MG/5ML syrup Take 100 mg by mouth every 4 (four) hours as needed for cough or congestion.   Marland Kitchen diltiazem (CARDIZEM CD) 240 MG 24 hr capsule Take 1 capsule (240 mg total) by mouth daily.  . fluticasone (FLONASE) 50 MCG/ACT nasal spray Place 2 sprays into both nostrils daily as needed for allergies or rhinitis.   . Fluticasone-Umeclidin-Vilant (TRELEGY ELLIPTA) 100-62.5-25 MCG/INH AEPB Inhale 1 puff into the lungs daily.   . furosemide (LASIX) 40 MG tablet Take 40 mg by mouth See admin instructions. Take one tablet by mouth TWICE DAILY ALTERNATING with taking one tablet by mouth ONCE DAILY  . glucose blood (EASYMAX TEST) test strip Use to check blood sugar 4 times daily.  Marland Kitchen guaiFENesin (MUCINEX) 600 MG 12 hr tablet Take 1 tablet (600 mg total) by mouth 2 (two) times daily as needed.  . hydrALAZINE (APRESOLINE) 25 MG tablet Take 1 tablet (25 mg total) by mouth every 8 (eight) hours for 30 days. (Patient taking differently: Take 25 mg by mouth 3 (three) times daily. )  . insulin regular human CONCENTRATED (HUMULIN R U-500 KWIKPEN) 500 UNIT/ML kwikpen INJECT 40 units SUBCUTANEOUSLY with breakfast, 40 units with lunch and supper WHEN BLOOD SUGAR IS GREATER THAN 90mg /dL.  Marland Kitchen ipratropium-albuterol (DUONEB) 0.5-2.5 (3) MG/3ML SOLN Take 3 mLs by nebulization every 6 (six) hours as needed (for cough).   . isosorbide dinitrate (ISORDIL) 10 MG tablet Take 1 tablet (10 mg total) by mouth 3 (three) times daily.  . Lancets 28G MISC USE TO CHECK BLOOD GLUCOSE FOUR TIMES DAILY  . metolazone (ZAROXOLYN) 5 MG tablet Take 5 mg by mouth daily.   . metoprolol tartrate (LOPRESSOR) 100 MG tablet Take 0.5 tablets (50 mg total) by mouth 2 (two) times daily.  . montelukast (SINGULAIR) 10 MG tablet Take 10 mg by mouth at bedtime.   . polyethylene glycol powder (GLYCOLAX/MIRALAX) 17 GM/SCOOP powder Take 17 g by mouth daily as needed for mild constipation or moderate constipation.  . potassium chloride SA (KLOR-CON) 20 MEQ tablet Take 20 mEq by mouth 2 (two) times daily.  . rosuvastatin (CRESTOR) 20 MG tablet TAKE (1) TABLET BY MOUTH ONCE DAILY.  Marland Kitchen triamcinolone cream (KENALOG) 0.5 % Apply 1 application topically every 12 (twelve) hours as needed (applied to bilateral legs for allergic dermatitis).   . Vitamin D, Cholecalciferol, 25 MCG  (1000 UT) TABS Take 1,000 Units by mouth daily.   . [DISCONTINUED] HUMULIN R U-500 KWIKPEN 500 UNIT/ML kwikpen INJECT 30 units SUBCUTANEOUSLY 3 TIMES DAILY WITH MEALS WHEN BLOOD SUGAR IS GREATER THAN 90mg /dL.   No facility-administered encounter medications on file as of 04/25/2020.   ALLERGIES: No Known Allergies VACCINATION STATUS: Immunization History  Administered Date(s) Administered  . Hepatitis B, adult 07/29/2018, 08/29/2018, 10/07/2018  . Influenza Inj Mdck Quad Pf 12/19/2016  . Influenza,inj,Quad PF,6+ Mos 02/02/2013, 05/15/2018  . Influenza-Unspecified 05/15/2018  . PPD Test 07/22/2018  . Pneumococcal Polysaccharide-23 05/15/2018  . Pneumococcal-Unspecified 05/15/2018    Diabetes She presents for her follow-up diabetic visit. She has type 2 diabetes mellitus. Onset time: she was diagnosed at approximate age of 85  years. Her disease course has been worsening. There are no hypoglycemic associated symptoms. Pertinent negatives for hypoglycemia include no confusion, headaches, pallor or seizures. Associated symptoms include polydipsia and polyuria. Pertinent negatives for diabetes include no blurred vision, no chest pain, no fatigue and no polyphagia. There are no hypoglycemic complications. Symptoms are worsening. Diabetic complications include nephropathy. Risk factors for coronary artery disease include diabetes mellitus, dyslipidemia, hypertension, obesity and sedentary lifestyle. Current diabetic treatment includes intensive insulin program. Her weight is fluctuating minimally. She is following a generally unhealthy diet. She has had a previous visit with a dietitian. She never participates in exercise. Her home blood glucose trend is increasing steadily. Her breakfast blood glucose range is generally 140-180 mg/dl. Her lunch blood glucose range is generally 180-200 mg/dl. Her dinner blood glucose range is generally 180-200 mg/dl. Her bedtime blood glucose range is generally 180-200  mg/dl. Her overall blood glucose range is 180-200 mg/dl. (She is accompanied by her aide from nursing home.  She presents with her logs showing significantly above target glycemic profile, point-of-care A1c of 7.6%.  No hypoglycemia was documented or reported.   ) An ACE inhibitor/angiotensin II receptor blocker is being taken.  Hypertension This is a chronic problem. The current episode started more than 1 year ago. The problem is uncontrolled. Pertinent negatives include no blurred vision, chest pain, headaches, palpitations or shortness of breath. Risk factors for coronary artery disease include diabetes mellitus, dyslipidemia, obesity and sedentary lifestyle. Past treatments include ACE inhibitors. Hypertensive end-organ damage includes kidney disease. Identifiable causes of hypertension include chronic renal disease.  Hyperlipidemia This is a chronic problem. The current episode started more than 1 year ago. The problem is uncontrolled. Exacerbating diseases include chronic renal disease, diabetes and obesity. Pertinent negatives include no chest pain, myalgias or shortness of breath. Current antihyperlipidemic treatment includes statins and fibric acid derivatives. Risk factors for coronary artery disease include diabetes mellitus, dyslipidemia, obesity, a sedentary lifestyle and hypertension.    Review of Systems  Constitutional: Positive for unexpected weight change. Negative for chills, fatigue and fever.  HENT: Negative for trouble swallowing and voice change.   Eyes: Negative for blurred vision and visual disturbance.  Respiratory: Negative for cough, shortness of breath and wheezing.   Cardiovascular: Negative for chest pain, palpitations and leg swelling.  Gastrointestinal: Negative for diarrhea, nausea and vomiting.  Endocrine: Positive for polydipsia and polyuria. Negative for cold intolerance, heat intolerance and polyphagia.  Musculoskeletal: Positive for gait problem. Negative for  arthralgias and myalgias.  Skin: Negative for color change, pallor, rash and wound.  Neurological: Negative for seizures and headaches.  Hematological: Does not bruise/bleed easily.  Psychiatric/Behavioral: Negative for confusion and suicidal ideas.    Objective:    BP 122/76   Pulse 96   Ht 5\' 4"  (1.626 m)   Wt 255 lb 3.2 oz (115.8 kg)   BMI 43.80 kg/m   Wt Readings from Last 3 Encounters:  04/25/20 255 lb 3.2 oz (115.8 kg)  02/20/20 251 lb (113.9 kg)  01/22/20 254 lb (115.2 kg)    Physical Exam Constitutional:      Appearance: She is well-developed.  HENT:     Head: Normocephalic and atraumatic.  Neck:     Thyroid: No thyromegaly.     Trachea: No tracheal deviation.  Cardiovascular:     Rate and Rhythm: Normal rate.  Pulmonary:     Effort: Pulmonary effort is normal.  Abdominal:     Tenderness: There is no abdominal tenderness. There is  no guarding.  Musculoskeletal:     Cervical back: Normal range of motion and neck supple.  Skin:    General: Skin is dry.     Coloration: Skin is not pale.     Findings: No erythema or rash.  Neurological:     Mental Status: She is alert and oriented to person, place, and time.     Cranial Nerves: No cranial nerve deficit.     Coordination: Coordination normal.  Psychiatric:        Judgment: Judgment normal.     Results for orders placed or performed in visit on 04/25/20  HgB A1c  Result Value Ref Range   Hemoglobin A1C     HbA1c POC (<> result, manual entry)     HbA1c, POC (prediabetic range)     HbA1c, POC (controlled diabetic range) 7.6 (A) 0.0 - 7.0 %  POCT UA - Microalbumin  Result Value Ref Range   Microalbumin Ur, POC 150 mg/L   Creatinine, POC 200 mg/dL   Albumin/Creatinine Ratio, Urine, POC 30-300    Complete Blood Count (Most recent): Lab Results  Component Value Date   WBC 8.9 09/20/2019   HGB 10.1 (L) 09/20/2019   HCT 31.5 (L) 09/20/2019   MCV 92.4 09/20/2019   PLT 228 09/20/2019   Chemistry (most  recent): Diabetic Labs (most recent): Lab Results  Component Value Date   HGBA1C 7.6 (A) 04/25/2020   HGBA1C 7.3 (A) 01/22/2020   HGBA1C 7.7 (A) 09/19/2019   Lipid Panel     Component Value Date/Time   CHOL 128 04/23/2020 0820   TRIG 147 04/23/2020 0820   HDL 43 04/23/2020 0820   CHOLHDL 3.0 04/23/2020 0820   CHOLHDL 5.5 (H) 09/12/2019 0831   VLDL 59 (H) 06/02/2018 0029   LDLCALC 60 04/23/2020 0820   LDLCALC 124 (H) 09/12/2019 0831    Assessment & Plan:   1. Type 2 diabetes mellitus with stage 4 chronic kidney disease, with long-term current use of insulin   -Her diabetes is  complicated by CKD, congestive heart failure,  obesity/sedentary life, and patient remains at extremely high risk for more acute and chronic complications of diabetes which include CAD, CVA, CKD, retinopathy, and neuropathy. These are all discussed in detail with the patient.  She is accompanied by her aide from nursing home.  She presents with her logs showing significantly above target glycemic profile, point-of-care A1c of 7.6%.  No hypoglycemia was documented or reported.    - I have re-counseled the patient on diet management and weight loss  by adopting a carbohydrate restricted / protein rich  Diet.  - she acknowledges that there is a room for improvement in her food and drink choices. - Suggestion is made for her to avoid simple carbohydrates  from her diet including Cakes, Sweet Desserts, Ice Cream, Soda (diet and regular), Sweet Tea, Candies, Chips, Cookies, Store Bought Juices, Alcohol in Excess of  1-2 drinks a day, Artificial Sweeteners,  Coffee Creamer, and "Sugar-free" Products, Lemonade. This will help patient to have more stable blood glucose profile and potentially avoid unintended weight gain.   - Patient is advised to stick to a routine mealtimes to eat 3 meals  a day and avoid unnecessary snacks ( to snack only to correct hypoglycemia).  - I have approached patient with the following  individualized plan to manage diabetes and patient agrees.  -Her placement in group home is the best development for her lately.  -Her presenting glycemic profile is on  or near target both fasting and postprandial.   -It is better for her to stay on Humulin RU 500 insulin for the sake of simplicity.  -She is advised to increase Humulin U500 to 40  Units with breakfast, 30 units with lunch and supper when glucose is above 90 mg/dl associated with monitoring of blood glucose before meals and at bedtime. - She is advised to skip insulin if pre-meal blood glucose is below 90 mg/dL or if she is not eating. -She is not a candidate for metformin, SGLT2 inhibitors due to CKD.   - Patient specific target  for A1c; LDL, HDL, Triglycerides, were discussed in detail.  2) BP/HTN:  -Her BP is controlled to target. She has urine microalbuminuria .She is advised on  salt restriction  and I advised her to continue current medications including lisinopril 20 mg p.o. daily, hydralazine 25 mg p.o. 3 times daily, furosemide 40 mg p.o. Daily.  3) Lipids/HPL: Her recent lipid panel showed loss of control of dyslipidemia, LDL at 124 increasing from 80.  She is tolerating Crestor  20 mg p.o. nightly, advised to continue.  She is also on fenofibrate.     4)  Weight/Diet: Her BMI is 00.7  -clearly complicating her diabetes care.   She is a candidate for modest weight loss.  CDE consult in progress, exercise, and carbohydrates information provided.  5) Chronic Care/Health Maintenance:  -Patient is  on ACEI and encouraged to continue to follow up with Ophthalmology, Podiatrist at least yearly or according to recommendations, and advised to stay away from smoking. I have recommended yearly flu vaccine and pneumonia vaccination at least every 5 years;  and  sleep for at least 7 hours a day.    - I advised patient to maintain close follow up with Rosita Fire, MD for primary care needs.  - Time spent on this patient  care encounter:  35 min, of which > 50% was spent in  counseling and the rest reviewing her blood glucose logs , discussing her hypoglycemia and hyperglycemia episodes, reviewing her current and  previous labs / studies  ( including abstraction from other facilities) and medications  doses and developing a  long term treatment plan and documenting her care.   Please refer to Patient Instructions for Blood Glucose Monitoring and Insulin/Medications Dosing Guide"  in media tab for additional information. Please  also refer to " Patient Self Inventory" in the Media  tab for reviewed elements of pertinent patient history.  [name] participated in the discussions, expressed understanding, and voiced agreement with the above plans.  All questions were answered to her satisfaction. she is encouraged to contact clinic should she have any questions or concerns prior to her return visit.    Follow up plan: -Return in about 3 months (around 07/24/2020) for Bring Meter and Logs- A1c in Office.  Glade Lloyd, MD Phone: [phone]  Fax: [phone]  This note was partially dictated with voice recognition software. Similar sounding words can be transcribed inadequately or may not  be corrected upon review.  04/25/2020, 12:29 PM

## 2020-04-26 DIAGNOSIS — J441 Chronic obstructive pulmonary disease with (acute) exacerbation: Secondary | ICD-10-CM | POA: Diagnosis not present

## 2020-04-26 DIAGNOSIS — U071 COVID-19: Secondary | ICD-10-CM | POA: Diagnosis not present

## 2020-04-26 DIAGNOSIS — I1310 Hypertensive heart and chronic kidney disease without heart failure, with stage 1 through stage 4 chronic kidney disease, or unspecified chronic kidney disease: Secondary | ICD-10-CM | POA: Diagnosis not present

## 2020-04-26 DIAGNOSIS — I5032 Chronic diastolic (congestive) heart failure: Secondary | ICD-10-CM | POA: Diagnosis not present

## 2020-04-26 DIAGNOSIS — N183 Chronic kidney disease, stage 3 unspecified: Secondary | ICD-10-CM | POA: Diagnosis not present

## 2020-04-26 DIAGNOSIS — E1122 Type 2 diabetes mellitus with diabetic chronic kidney disease: Secondary | ICD-10-CM | POA: Diagnosis not present

## 2020-04-29 ENCOUNTER — Telehealth: Payer: Self-pay | Admitting: "Endocrinology

## 2020-04-29 DIAGNOSIS — J441 Chronic obstructive pulmonary disease with (acute) exacerbation: Secondary | ICD-10-CM | POA: Diagnosis not present

## 2020-04-29 DIAGNOSIS — E1122 Type 2 diabetes mellitus with diabetic chronic kidney disease: Secondary | ICD-10-CM | POA: Diagnosis not present

## 2020-04-29 DIAGNOSIS — I1310 Hypertensive heart and chronic kidney disease without heart failure, with stage 1 through stage 4 chronic kidney disease, or unspecified chronic kidney disease: Secondary | ICD-10-CM | POA: Diagnosis not present

## 2020-04-29 DIAGNOSIS — U071 COVID-19: Secondary | ICD-10-CM | POA: Diagnosis not present

## 2020-04-29 DIAGNOSIS — N183 Chronic kidney disease, stage 3 unspecified: Secondary | ICD-10-CM | POA: Diagnosis not present

## 2020-04-29 DIAGNOSIS — I5032 Chronic diastolic (congestive) heart failure: Secondary | ICD-10-CM | POA: Diagnosis not present

## 2020-04-29 NOTE — Telephone Encounter (Signed)
[name] called from [facility] and said that her instructions that were written down for the patient at her last appt from Dr. Dorris Fetch were insulin 40 units in AM, 30 for lunch and 30 for supper. She states the pharmacy received instructions for 40 units 3x a day. Please clarify. [phone] ask for [name]

## 2020-04-29 NOTE — Telephone Encounter (Signed)
Returned call and advised it should be 40 units at breakfast, 30 at lunch and supper , pharmacy also made aware.

## 2020-04-30 DIAGNOSIS — I1310 Hypertensive heart and chronic kidney disease without heart failure, with stage 1 through stage 4 chronic kidney disease, or unspecified chronic kidney disease: Secondary | ICD-10-CM | POA: Diagnosis not present

## 2020-04-30 DIAGNOSIS — N183 Chronic kidney disease, stage 3 unspecified: Secondary | ICD-10-CM | POA: Diagnosis not present

## 2020-04-30 DIAGNOSIS — J441 Chronic obstructive pulmonary disease with (acute) exacerbation: Secondary | ICD-10-CM | POA: Diagnosis not present

## 2020-04-30 DIAGNOSIS — U071 COVID-19: Secondary | ICD-10-CM | POA: Diagnosis not present

## 2020-04-30 DIAGNOSIS — E1122 Type 2 diabetes mellitus with diabetic chronic kidney disease: Secondary | ICD-10-CM | POA: Diagnosis not present

## 2020-04-30 DIAGNOSIS — I5032 Chronic diastolic (congestive) heart failure: Secondary | ICD-10-CM | POA: Diagnosis not present

## 2020-05-01 DIAGNOSIS — I5032 Chronic diastolic (congestive) heart failure: Secondary | ICD-10-CM | POA: Diagnosis not present

## 2020-05-01 DIAGNOSIS — J441 Chronic obstructive pulmonary disease with (acute) exacerbation: Secondary | ICD-10-CM | POA: Diagnosis not present

## 2020-05-01 DIAGNOSIS — E1122 Type 2 diabetes mellitus with diabetic chronic kidney disease: Secondary | ICD-10-CM | POA: Diagnosis not present

## 2020-05-01 DIAGNOSIS — I1310 Hypertensive heart and chronic kidney disease without heart failure, with stage 1 through stage 4 chronic kidney disease, or unspecified chronic kidney disease: Secondary | ICD-10-CM | POA: Diagnosis not present

## 2020-05-01 DIAGNOSIS — N183 Chronic kidney disease, stage 3 unspecified: Secondary | ICD-10-CM | POA: Diagnosis not present

## 2020-05-01 DIAGNOSIS — U071 COVID-19: Secondary | ICD-10-CM | POA: Diagnosis not present

## 2020-05-02 DIAGNOSIS — I5032 Chronic diastolic (congestive) heart failure: Secondary | ICD-10-CM | POA: Diagnosis not present

## 2020-05-02 DIAGNOSIS — J441 Chronic obstructive pulmonary disease with (acute) exacerbation: Secondary | ICD-10-CM | POA: Diagnosis not present

## 2020-05-02 DIAGNOSIS — I1310 Hypertensive heart and chronic kidney disease without heart failure, with stage 1 through stage 4 chronic kidney disease, or unspecified chronic kidney disease: Secondary | ICD-10-CM | POA: Diagnosis not present

## 2020-05-02 DIAGNOSIS — N183 Chronic kidney disease, stage 3 unspecified: Secondary | ICD-10-CM | POA: Diagnosis not present

## 2020-05-02 DIAGNOSIS — E1122 Type 2 diabetes mellitus with diabetic chronic kidney disease: Secondary | ICD-10-CM | POA: Diagnosis not present

## 2020-05-02 DIAGNOSIS — U071 COVID-19: Secondary | ICD-10-CM | POA: Diagnosis not present

## 2020-05-03 DIAGNOSIS — E1122 Type 2 diabetes mellitus with diabetic chronic kidney disease: Secondary | ICD-10-CM | POA: Diagnosis not present

## 2020-05-03 DIAGNOSIS — J441 Chronic obstructive pulmonary disease with (acute) exacerbation: Secondary | ICD-10-CM | POA: Diagnosis not present

## 2020-05-03 DIAGNOSIS — B9789 Other viral agents as the cause of diseases classified elsewhere: Secondary | ICD-10-CM | POA: Diagnosis not present

## 2020-05-03 DIAGNOSIS — I5032 Chronic diastolic (congestive) heart failure: Secondary | ICD-10-CM | POA: Diagnosis not present

## 2020-05-06 DIAGNOSIS — I1310 Hypertensive heart and chronic kidney disease without heart failure, with stage 1 through stage 4 chronic kidney disease, or unspecified chronic kidney disease: Secondary | ICD-10-CM | POA: Diagnosis not present

## 2020-05-06 DIAGNOSIS — I5032 Chronic diastolic (congestive) heart failure: Secondary | ICD-10-CM | POA: Diagnosis not present

## 2020-05-06 DIAGNOSIS — N183 Chronic kidney disease, stage 3 unspecified: Secondary | ICD-10-CM | POA: Diagnosis not present

## 2020-05-06 DIAGNOSIS — E1122 Type 2 diabetes mellitus with diabetic chronic kidney disease: Secondary | ICD-10-CM | POA: Diagnosis not present

## 2020-05-06 DIAGNOSIS — J441 Chronic obstructive pulmonary disease with (acute) exacerbation: Secondary | ICD-10-CM | POA: Diagnosis not present

## 2020-05-06 DIAGNOSIS — U071 COVID-19: Secondary | ICD-10-CM | POA: Diagnosis not present

## 2020-05-07 ENCOUNTER — Other Ambulatory Visit (HOSPITAL_COMMUNITY): Payer: Self-pay | Admitting: Gerontology

## 2020-05-07 ENCOUNTER — Other Ambulatory Visit: Payer: Self-pay

## 2020-05-07 ENCOUNTER — Ambulatory Visit (HOSPITAL_COMMUNITY)
Admission: RE | Admit: 2020-05-07 | Discharge: 2020-05-07 | Disposition: A | Discharge status: Home / Self Care | Payer: Medicare Other | Source: Ambulatory Visit | Attending: Gerontology | Admitting: Gerontology

## 2020-05-07 DIAGNOSIS — I1310 Hypertensive heart and chronic kidney disease without heart failure, with stage 1 through stage 4 chronic kidney disease, or unspecified chronic kidney disease: Secondary | ICD-10-CM | POA: Diagnosis not present

## 2020-05-07 DIAGNOSIS — N189 Chronic kidney disease, unspecified: Secondary | ICD-10-CM | POA: Diagnosis not present

## 2020-05-07 DIAGNOSIS — E211 Secondary hyperparathyroidism, not elsewhere classified: Secondary | ICD-10-CM | POA: Diagnosis not present

## 2020-05-07 DIAGNOSIS — J441 Chronic obstructive pulmonary disease with (acute) exacerbation: Secondary | ICD-10-CM | POA: Diagnosis not present

## 2020-05-07 DIAGNOSIS — I517 Cardiomegaly: Secondary | ICD-10-CM | POA: Diagnosis not present

## 2020-05-07 DIAGNOSIS — U071 COVID-19: Secondary | ICD-10-CM | POA: Diagnosis not present

## 2020-05-07 DIAGNOSIS — Z0001 Encounter for general adult medical examination with abnormal findings: Secondary | ICD-10-CM | POA: Diagnosis not present

## 2020-05-07 DIAGNOSIS — I5032 Chronic diastolic (congestive) heart failure: Secondary | ICD-10-CM | POA: Diagnosis not present

## 2020-05-07 DIAGNOSIS — N183 Chronic kidney disease, stage 3 unspecified: Secondary | ICD-10-CM | POA: Diagnosis not present

## 2020-05-07 DIAGNOSIS — R0602 Shortness of breath: Secondary | ICD-10-CM | POA: Diagnosis not present

## 2020-05-07 DIAGNOSIS — Z79899 Other long term (current) drug therapy: Secondary | ICD-10-CM | POA: Diagnosis not present

## 2020-05-07 DIAGNOSIS — J9811 Atelectasis: Secondary | ICD-10-CM | POA: Diagnosis not present

## 2020-05-07 DIAGNOSIS — R059 Cough, unspecified: Secondary | ICD-10-CM | POA: Diagnosis not present

## 2020-05-07 DIAGNOSIS — E1122 Type 2 diabetes mellitus with diabetic chronic kidney disease: Secondary | ICD-10-CM | POA: Diagnosis not present

## 2020-05-07 DIAGNOSIS — N1831 Chronic kidney disease, stage 3a: Secondary | ICD-10-CM | POA: Diagnosis not present

## 2020-05-07 DIAGNOSIS — E876 Hypokalemia: Secondary | ICD-10-CM | POA: Diagnosis not present

## 2020-05-07 DIAGNOSIS — D631 Anemia in chronic kidney disease: Secondary | ICD-10-CM | POA: Diagnosis not present

## 2020-05-07 DIAGNOSIS — E559 Vitamin D deficiency, unspecified: Secondary | ICD-10-CM | POA: Diagnosis not present

## 2020-05-07 LAB — LIPID PANEL
Cholesterol: 127 (ref 0–200)
HDL: 42 (ref 35–70)
LDL Cholesterol: 62
Triglycerides: 150 (ref 40–160)

## 2020-05-07 LAB — BASIC METABOLIC PANEL
BUN: 48 — AB (ref 4–21)
CO2: 31 — AB (ref 13–22)
Chloride: 100 (ref 99–108)
Creatinine: 1.6 — AB (ref 0.5–1.1)
Glucose: 169
Potassium: 3.5 (ref 3.4–5.3)
Sodium: 141 (ref 137–147)

## 2020-05-07 LAB — COMPREHENSIVE METABOLIC PANEL
Albumin: 3.8 (ref 3.5–5.0)
Calcium: 9 (ref 8.7–10.7)
GFR calc Af Amer: 38
GFR calc non Af Amer: 33
Globulin: 3.1

## 2020-05-07 LAB — HEMOGLOBIN A1C: Hemoglobin A1C: 7.4

## 2020-05-07 LAB — HEPATIC FUNCTION PANEL
ALT: 10 (ref 7–35)
AST: 13 (ref 13–35)
Alkaline Phosphatase: 72 (ref 25–125)
Bilirubin, Direct: 0.1 (ref 0.01–0.4)
Bilirubin, Total: 0.3

## 2020-05-08 ENCOUNTER — Encounter: Payer: Self-pay | Admitting: Gastroenterology

## 2020-05-08 ENCOUNTER — Ambulatory Visit (INDEPENDENT_AMBULATORY_CARE_PROVIDER_SITE_OTHER): Payer: Medicare Other | Admitting: Gastroenterology

## 2020-05-08 ENCOUNTER — Telehealth: Payer: Self-pay | Admitting: Gastroenterology

## 2020-05-08 VITALS — BP 127/65 | HR 60 | Temp 96.6°F | Ht 64.0 in | Wt 251.6 lb

## 2020-05-08 DIAGNOSIS — D3A090 Benign carcinoid tumor of the bronchus and lung: Secondary | ICD-10-CM

## 2020-05-08 DIAGNOSIS — Z1211 Encounter for screening for malignant neoplasm of colon: Secondary | ICD-10-CM | POA: Insufficient documentation

## 2020-05-08 DIAGNOSIS — Z86010 Personal history of colonic polyps: Secondary | ICD-10-CM

## 2020-05-08 NOTE — Telephone Encounter (Signed)
Referral placed.

## 2020-05-08 NOTE — Addendum Note (Signed)
Addended by: Cheron Every on: 05/08/2020 02:01 PM   Modules accepted: Orders

## 2020-05-08 NOTE — Telephone Encounter (Signed)
Patient seen in office today. We will be setting up colonoscopy in 4 weeks or so with Dr. [name].  1# [facility] clinical, please make referral to oncology for follow up of carcinoid tumor of the left lung. She was last seen by Dr. Lia Hopping in 08/2018.   2#  Can we please find out from long term care center the follow? -does she still have a legal guardian, if so please make sure we have their contact information  -please have them fax copy of her two positive covid test results to [place].  3# Manuela Schwartz, please send copy of my office note to Dr. Legrand Rams and [facility]

## 2020-05-08 NOTE — Progress Notes (Signed)
Primary Care Physician:  Rosita Fire, MD  Primary Gastroenterologist:  Garfield Cornea, MD   Chief Complaint  Patient presents with  . Colonoscopy    Consult, not having any problems    HPI:  [name] is a 65 y.o. female with multiple comorbidities including morbid obesity, obstructive sleep apnea, diabetes, chronic kidney disease stage III, A. fib on Eliquis, chronic diastolic CHF, CAD, HTN, left lower lung carcinoid tumor presenting to schedule overdue surveillance colonoscopy at the request of Dr. Legrand Rams.  Patient currently resides at [facility] facility.  Patient presents today to schedule colonoscopy.  She remembers has been a long time since her last 1.  Last colonoscopy was in 2012, she had a single tubular adenoma removed.  Procedure done at that time for IDA.  She was supposed to have a 5-year follow-up study.  Today she denies any issues with her bowels.  No constipation or diarrhea.  No melena or rectal bleeding.  Denies abdominal pain.  States her appetite is good.  Denies heartburn, dysphagia, vomiting.  She states she has been a cough but it is getting better.  In fact she went for chest x-ray prior to coming to our office.  Study showed mild left base atelectasis, interstitial thickening may indicate a degree of chronic bronchitis.  Review of prior records in epic: Patient hospitalized in March 2020, underwent a TEE and bronchoscopy.  Biopsy from endobronchial left lower lung, showed typical carcinoid tumor, atypical squamous metaplasia, favor reactive.  She was seen by oncology during hospitalization.  Followed for a bit as an outpatient, last visit noted in June 2020.  Further management pending approval by her legal guardian.  Appears she had PET scan was obtained September 2020 showing: PET scan showed solitary well-differentiated neuroendocrine tumor within the left infrahilar lung located at the distal aspect of the left mainstem bronchus.  It is not clear that she ever  followed up with oncology for her carcinoid tumor.    Current Outpatient Medications  Medication Sig Dispense Refill  . acetaminophen (TYLENOL) 325 MG tablet Take 325 mg by mouth every 12 (twelve) hours as needed for moderate pain.    Marland Kitchen albuterol (PROVENTIL HFA;VENTOLIN HFA) 108 (90 Base) MCG/ACT inhaler Inhale 2 puffs into the lungs every 6 (six) hours as needed for shortness of breath.    Marland Kitchen apixaban (ELIQUIS) 5 MG TABS tablet Take 1 tablet (5 mg total) by mouth 2 (two) times daily. 60 tablet 0  . betamethasone dipropionate (DIPROLENE) 0.05 % ointment Apply 1 application topically daily as needed (for rash under breasts).     . cetirizine (ZYRTEC) 10 MG tablet Take 10 mg by mouth daily.    . clotrimazole (LOTRIMIN) 1 % cream Apply 1 application topically daily as needed (under breasts as needed for rash).    . DIABETIC TUSSIN EX 100 MG/5ML syrup Take 100 mg by mouth every 4 (four) hours as needed for cough or congestion.     Marland Kitchen diltiazem (CARDIZEM CD) 240 MG 24 hr capsule Take 1 capsule (240 mg total) by mouth daily. 30 capsule 0  . fluticasone (FLONASE) 50 MCG/ACT nasal spray Place 2 sprays into both nostrils daily as needed for allergies or rhinitis.    . Fluticasone-Umeclidin-Vilant 100-62.5-25 MCG/INH AEPB Inhale 1 puff into the lungs daily.     . furosemide (LASIX) 40 MG tablet Take 40 mg by mouth See admin instructions. Take one tablet by mouth TWICE DAILY ALTERNATING with taking one tablet by mouth ONCE DAILY    .  glucose blood (EASYMAX TEST) test strip Use to check blood sugar 4 times daily. 200 strip 2  . guaiFENesin (MUCINEX) 600 MG 12 hr tablet Take 1 tablet (600 mg total) by mouth 2 (two) times daily as needed. 60 tablet 11  . hydrALAZINE (APRESOLINE) 25 MG tablet Take 1 tablet (25 mg total) by mouth every 8 (eight) hours for 30 days. (Patient taking differently: Take 25 mg by mouth 3 (three) times daily.) 90 tablet 0  . insulin regular human CONCENTRATED (HUMULIN R U-500 KWIKPEN) 500  UNIT/ML kwikpen INJECT 40 units SUBCUTANEOUSLY with breakfast, 40 units with lunch and supper WHEN BLOOD SUGAR IS GREATER THAN 90mg /dL. 6 mL 0  . ipratropium-albuterol (DUONEB) 0.5-2.5 (3) MG/3ML SOLN Take 3 mLs by nebulization every 6 (six) hours as needed (for cough).     . isosorbide dinitrate (ISORDIL) 10 MG tablet Take 1 tablet (10 mg total) by mouth 3 (three) times daily. 90 tablet 0  . Lancets 28G MISC USE TO CHECK BLOOD GLUCOSE FOUR TIMES DAILY 200 each 1  . metolazone (ZAROXOLYN) 5 MG tablet Take 5 mg by mouth daily.     . metoprolol tartrate (LOPRESSOR) 100 MG tablet Take 0.5 tablets (50 mg total) by mouth 2 (two) times daily.    . montelukast (SINGULAIR) 10 MG tablet Take 10 mg by mouth at bedtime.     . polyethylene glycol powder (GLYCOLAX/MIRALAX) 17 GM/SCOOP powder Take 17 g by mouth daily as needed for mild constipation or moderate constipation.    . potassium chloride SA (KLOR-CON) 20 MEQ tablet Take 20 mEq by mouth 2 (two) times daily.    . rosuvastatin (CRESTOR) 20 MG tablet TAKE (1) TABLET BY MOUTH ONCE DAILY. 30 tablet 3  . triamcinolone cream (KENALOG) 0.5 % Apply 1 application topically every 12 (twelve) hours as needed (applied to bilateral legs for allergic dermatitis).     . Vitamin D, Cholecalciferol, 25 MCG (1000 UT) TABS Take 1,000 Units by mouth daily.      No current facility-administered medications for this visit.    Allergies as of 05/08/2020  . (No Known Allergies)    Past Medical History:  Diagnosis Date  . Arthritis   . Asthma   . Atrial fibrillation ([place])   . Chronic diastolic CHF (congestive heart failure) ([place])   . CKD (chronic kidney disease), stage III ([place])   . Diabetes mellitus type 2 in obese ([place])   . Hypertension   . Iron deficiency anemia 10/16/2010  . Mental handicap 10/16/2010  . Mild CAD 2013  . Morbid obesity ([place])   . OSA (obstructive sleep apnea)     Past Surgical History:  Procedure Laterality Date  . ABDOMINAL HYSTERECTOMY     . AV FISTULA PLACEMENT Right 06/06/2018   Procedure: ARTERIOVENOUS (AV) FISTULA CREATION;  Surgeon: Marty Heck, MD;  Location: [facility];  Service: Vascular;  Laterality: Right;  . CESAREAN SECTION    . CHOLECYSTECTOMY    . COLONOSCOPY  08/2010   normal TI, sigmoid polyp (adenoma ). Next TCS due  08/2015,  . ESOPHAGOGASTRODUODENOSCOPY  08/2010   antral and duodenal erosions s/p bx (chronic gastritis, no h.pylori, no celiac dz ), hiatal hernia  . FISTULA SUPERFICIALIZATION Right 08/25/2018   Procedure: FISTULA SUPERFICIALIZATION RIGHT ARM;  Surgeon: Waynetta Sandy, MD;  Location: [facility];  Service: Cardiovascular;  Laterality: Right;  . IR FLUORO GUIDE CV LINE RIGHT  05/31/2018  . IR US GUIDE VASC ACCESS RIGHT  05/31/2018  . KNEE  SURGERY     right knee @ 65 years of age  . LEFT AND RIGHT HEART CATHETERIZATION WITH CORONARY ANGIOGRAM N/A 09/21/2011   Procedure: LEFT AND RIGHT HEART CATHETERIZATION WITH CORONARY ANGIOGRAM;  Surgeon: Birdie Riddle, MD;  Location: [facility];  Service: Cardiovascular;  Laterality: N/A;    Family History  Problem Relation Age of Onset  . Diabetes Mother   . Hypertension Mother   . Heart failure Mother   . Hypertension Father   . Atrial fibrillation Father   . Kidney disease Other        son reportedly has had cyst on kidney and lung s/p surgery at [facility]  . Hypertension Son   . Colon cancer Neg Hx     Social History   Socioeconomic History  . Marital status: Divorced    Spouse name: Not on file  . Number of children: 1  . Years of education: Not on file  . Highest education level: Not on file  Occupational History    Employer: UNEMPLOYED  Tobacco Use  . Smoking status: Former Smoker    Packs/day: 1.00    Years: 0.00    Pack years: 0.00    Types: Cigarettes    Quit date: 04/11/1996    Years since quitting: 24.0  . Smokeless tobacco: Never Used  . Tobacco comment: quit a couple year ago  Vaping Use  . Vaping Use: Never used   Substance and Sexual Activity  . Alcohol use: No  . Drug use: No  . Sexual activity: Never  Other Topics Concern  . Not on file  Social History Narrative   Lives with parents.    Social Determinants of Health   Financial Resource Strain: Not on file  Food Insecurity: Not on file  Transportation Needs: Not on file  Physical Activity: Not on file  Stress: Not on file  Social Connections: Not on file  Intimate Partner Violence: Not on file      ROS:  General: Negative for anorexia, weight loss, fever, chills, fatigue, positive weakness. Eyes: Negative for vision changes.  ENT: Negative for hoarseness, difficulty swallowing , nasal congestion. CV: Negative for chest pain, angina, palpitations, dyspnea on exertion, peripheral edema.  Respiratory: Negative for dyspnea at rest, dyspnea on exertion, positive cough, sputum, wheezing.  GI: See history of present illness. GU:  Negative for dysuria, hematuria, urinary incontinence, urinary frequency, nocturnal urination.  MS: Negative for joint pain, low back pain.  Derm: Negative for rash or itching.  Neuro: Negative for weakness, abnormal sensation, seizure, frequent headaches, memory loss, confusion.  Psych: Negative for anxiety, depression, suicidal ideation, hallucinations.  Endo: Negative for unusual weight change.  Heme: Negative for bruising or bleeding. Allergy: Negative for rash or hives.    Physical Examination:  BP 127/65   Pulse 60   Temp (!) 96.6 F (35.9 C) (Temporal)   Ht 5\' 4"  (1.626 m)   Wt 251 lb 9.6 oz (114.1 kg)   BMI 43.19 kg/m    General: Well-nourished, well-developed in no acute distress.  Cough noted while in the office today.  Not productive. Head: Normocephalic, atraumatic.   Eyes: Conjunctiva pink, no icterus. Mouth: masked Neck: Supple without thyromegaly, masses, or lymphadenopathy.  Lungs: Clear to auscultation bilaterally.  Diminished breath sounds, Heart: Regular rate and rhythm, no  murmurs rubs or gallops.  Abdomen: Bowel sounds are normal, nontender, nondistended, no hepatosplenomegaly or masses, no abdominal bruits or    hernia , no rebound or guarding.  Exam limited due to body habitus.  Patient also reported that she could not get on the exam table. Rectal: not performed Extremities: 1+ lower extremity edema. No clubbing or deformities.  Neuro: Alert and oriented x 4 , grossly normal neurologically.  Skin: Warm and dry, no rash or jaundice.   Psych: Alert and cooperative, normal mood and affect.  Labs: Labs 05/07/2020: Glucose 161, BUN 48, creatinine 1.82, albumin 3.8, potassium 3.4 Labs 04/18/20: H/H 10/30.2, MCV 85.8 Labs 06/2019: iron 46, TIBC 269, iron sat 17  Imaging Studies: DG Chest 2 View  Result Date: 05/08/2020 CLINICAL DATA:  Shortness of breath and cough EXAM: CHEST - 2 VIEW COMPARISON:  September 19, 2019 chest radiograph and PET-CT December 22, 2018 FINDINGS: There is atelectatic change in the left base. No edema or airspace opacity. There are areas of interstitial thickening, stable. No parenchymal nodular lesions evident by radiography. There is cardiomegaly with pulmonary vascularity within normal limits. No adenopathy. No bone lesions. IMPRESSION: No edema or airspace opacity. No parenchymal lung nodular lesions evident by radiography. Mild left base atelectasis. No consolidation. Interstitial thickening may indicate a degree of chronic bronchitis. Stable cardiomegaly.  No adenopathy evident. Electronically Signed   By: Lowella Grip III M.D.   On: 05/08/2020 08:59    Assessment/Plan:  Pleasant 64 year old female presenting from long-term care facility to schedule colonoscopy at the request of Dr. Legrand Rams.  Last colonoscopy completed in June 2012, she had a sigmoid tubular adenoma removed, advised for follow-up in 5 years.  She was lost to follow-up.  History of colon adenoma: Overdue for surveillance exam.  Denies any GI symptoms.  She had COVID last  month.  She has persisting cough.  Chest x-ray today as outlined.  We will plan on colonoscopy in 3 to 4 weeks to allow for further time to recuperate.  Plan to hold Eliquis 48 hours prior to procedure.  Given renal insufficiency, plan for TriLyte.  She will receive the entire prep the day before her colonoscopy.  While in the office today, patient appears appropriate and capable of making decision regarding colonoscopy.  She seemed to have a good understanding.  She remembers having it previously.  I have discussed the risks, alternatives, benefits with regards to but not limited to the risk of reaction to medication, bleeding, infection, perforation and the patient is agreeable to proceed. Written consent to be obtained.  According to epic records, patient has a legal guardian [name] [phone].  We will touch base with her prior to proceeding with colonoscopy.  Patient also has a history of left long carcinoid tumor.  It does not appear that she has had any recent follow-up with oncology.  Will make referral.

## 2020-05-08 NOTE — Patient Instructions (Signed)
Please fax copy of positive covid test results to [phone]. Colonoscopy to be scheduled.

## 2020-05-09 DIAGNOSIS — I1310 Hypertensive heart and chronic kidney disease without heart failure, with stage 1 through stage 4 chronic kidney disease, or unspecified chronic kidney disease: Secondary | ICD-10-CM | POA: Diagnosis not present

## 2020-05-09 DIAGNOSIS — E1122 Type 2 diabetes mellitus with diabetic chronic kidney disease: Secondary | ICD-10-CM | POA: Diagnosis not present

## 2020-05-09 DIAGNOSIS — I5032 Chronic diastolic (congestive) heart failure: Secondary | ICD-10-CM | POA: Diagnosis not present

## 2020-05-09 DIAGNOSIS — N183 Chronic kidney disease, stage 3 unspecified: Secondary | ICD-10-CM | POA: Diagnosis not present

## 2020-05-09 DIAGNOSIS — U071 COVID-19: Secondary | ICD-10-CM | POA: Diagnosis not present

## 2020-05-09 DIAGNOSIS — J441 Chronic obstructive pulmonary disease with (acute) exacerbation: Secondary | ICD-10-CM | POA: Diagnosis not present

## 2020-05-09 NOTE — Telephone Encounter (Signed)
Spoke with [name] is pts legal guardian [name] [phone] ext [phone]). They will send a copy of positive COVID test tomorrow when the manager returns to the office.

## 2020-05-12 DIAGNOSIS — J441 Chronic obstructive pulmonary disease with (acute) exacerbation: Secondary | ICD-10-CM | POA: Diagnosis not present

## 2020-05-12 DIAGNOSIS — N183 Chronic kidney disease, stage 3 unspecified: Secondary | ICD-10-CM | POA: Diagnosis not present

## 2020-05-12 DIAGNOSIS — E1122 Type 2 diabetes mellitus with diabetic chronic kidney disease: Secondary | ICD-10-CM | POA: Diagnosis not present

## 2020-05-12 DIAGNOSIS — I5032 Chronic diastolic (congestive) heart failure: Secondary | ICD-10-CM | POA: Diagnosis not present

## 2020-05-12 DIAGNOSIS — U071 COVID-19: Secondary | ICD-10-CM | POA: Diagnosis not present

## 2020-05-12 DIAGNOSIS — I1310 Hypertensive heart and chronic kidney disease without heart failure, with stage 1 through stage 4 chronic kidney disease, or unspecified chronic kidney disease: Secondary | ICD-10-CM | POA: Diagnosis not present

## 2020-05-13 DIAGNOSIS — J441 Chronic obstructive pulmonary disease with (acute) exacerbation: Secondary | ICD-10-CM | POA: Diagnosis not present

## 2020-05-13 DIAGNOSIS — N183 Chronic kidney disease, stage 3 unspecified: Secondary | ICD-10-CM | POA: Diagnosis not present

## 2020-05-13 DIAGNOSIS — I5032 Chronic diastolic (congestive) heart failure: Secondary | ICD-10-CM | POA: Diagnosis not present

## 2020-05-13 DIAGNOSIS — U071 COVID-19: Secondary | ICD-10-CM | POA: Diagnosis not present

## 2020-05-13 DIAGNOSIS — I1310 Hypertensive heart and chronic kidney disease without heart failure, with stage 1 through stage 4 chronic kidney disease, or unspecified chronic kidney disease: Secondary | ICD-10-CM | POA: Diagnosis not present

## 2020-05-13 DIAGNOSIS — E1122 Type 2 diabetes mellitus with diabetic chronic kidney disease: Secondary | ICD-10-CM | POA: Diagnosis not present

## 2020-05-14 ENCOUNTER — Telehealth: Payer: Self-pay | Admitting: Hematology

## 2020-05-14 ENCOUNTER — Telehealth: Payer: Self-pay

## 2020-05-14 DIAGNOSIS — E1122 Type 2 diabetes mellitus with diabetic chronic kidney disease: Secondary | ICD-10-CM | POA: Diagnosis not present

## 2020-05-14 DIAGNOSIS — I1310 Hypertensive heart and chronic kidney disease without heart failure, with stage 1 through stage 4 chronic kidney disease, or unspecified chronic kidney disease: Secondary | ICD-10-CM | POA: Diagnosis not present

## 2020-05-14 DIAGNOSIS — J441 Chronic obstructive pulmonary disease with (acute) exacerbation: Secondary | ICD-10-CM | POA: Diagnosis not present

## 2020-05-14 DIAGNOSIS — N183 Chronic kidney disease, stage 3 unspecified: Secondary | ICD-10-CM | POA: Diagnosis not present

## 2020-05-14 DIAGNOSIS — I5032 Chronic diastolic (congestive) heart failure: Secondary | ICD-10-CM | POA: Diagnosis not present

## 2020-05-14 DIAGNOSIS — U071 COVID-19: Secondary | ICD-10-CM | POA: Diagnosis not present

## 2020-05-14 NOTE — Telephone Encounter (Signed)
Pre-op appt 07/04/20.  Rx for prep, pre-op appt letter and procedure instructions faxed to [facility] (fax# [phone]).

## 2020-05-14 NOTE — Telephone Encounter (Signed)
Pt has been scheduled to see Dr. Irene Limbo on 3/8 at 1pm. Pt is currently a resident at [facility] in [place]. I spoke to [facility], at the facility, and scheduled Ms. Lawther to see Dr. Irene Limbo on 3/8 at 1pm.

## 2020-05-14 NOTE — Telephone Encounter (Signed)
Called [facility] (spoke to Mar[place]c), TCS w/Propofol w/Dr. [name] ASA 3 scheduled for 07/08/20--AM (diabetic). Orders entered. Informed endo scheduler pt is at a facility (will need covid test morning of procedure). Confirmed w/[facility] pt was covid+ 04/02/20.

## 2020-05-15 DIAGNOSIS — R809 Proteinuria, unspecified: Secondary | ICD-10-CM | POA: Diagnosis not present

## 2020-05-15 DIAGNOSIS — E211 Secondary hyperparathyroidism, not elsewhere classified: Secondary | ICD-10-CM | POA: Diagnosis not present

## 2020-05-15 DIAGNOSIS — E1129 Type 2 diabetes mellitus with other diabetic kidney complication: Secondary | ICD-10-CM | POA: Diagnosis not present

## 2020-05-15 DIAGNOSIS — D631 Anemia in chronic kidney disease: Secondary | ICD-10-CM | POA: Diagnosis not present

## 2020-05-15 DIAGNOSIS — E559 Vitamin D deficiency, unspecified: Secondary | ICD-10-CM | POA: Diagnosis not present

## 2020-05-15 DIAGNOSIS — N189 Chronic kidney disease, unspecified: Secondary | ICD-10-CM | POA: Diagnosis not present

## 2020-05-15 DIAGNOSIS — E1122 Type 2 diabetes mellitus with diabetic chronic kidney disease: Secondary | ICD-10-CM | POA: Diagnosis not present

## 2020-05-15 DIAGNOSIS — I5032 Chronic diastolic (congestive) heart failure: Secondary | ICD-10-CM | POA: Diagnosis not present

## 2020-05-15 DIAGNOSIS — E876 Hypokalemia: Secondary | ICD-10-CM | POA: Diagnosis not present

## 2020-05-15 NOTE — Telephone Encounter (Signed)
Received copy of covid test from facility. Patient was positive on 04/03/20.  Spoke with [name] with [facility]. [name] states that they have limited guardianship of patient but will be willing to provide consent for colonoscopy if needed. Please contact closer to date to obtain consent.

## 2020-05-16 DIAGNOSIS — N183 Chronic kidney disease, stage 3 unspecified: Secondary | ICD-10-CM | POA: Diagnosis not present

## 2020-05-16 DIAGNOSIS — J441 Chronic obstructive pulmonary disease with (acute) exacerbation: Secondary | ICD-10-CM | POA: Diagnosis not present

## 2020-05-16 DIAGNOSIS — U071 COVID-19: Secondary | ICD-10-CM | POA: Diagnosis not present

## 2020-05-16 DIAGNOSIS — I1310 Hypertensive heart and chronic kidney disease without heart failure, with stage 1 through stage 4 chronic kidney disease, or unspecified chronic kidney disease: Secondary | ICD-10-CM | POA: Diagnosis not present

## 2020-05-16 DIAGNOSIS — E1122 Type 2 diabetes mellitus with diabetic chronic kidney disease: Secondary | ICD-10-CM | POA: Diagnosis not present

## 2020-05-16 DIAGNOSIS — I5032 Chronic diastolic (congestive) heart failure: Secondary | ICD-10-CM | POA: Diagnosis not present

## 2020-05-16 NOTE — Telephone Encounter (Signed)
Endo staff will be calling legal guardian prior to procedure to obtain consent.

## 2020-05-16 NOTE — Telephone Encounter (Signed)
noted 

## 2020-05-16 NOTE — Telephone Encounter (Signed)
See other phone note, procedure scheduled for 07/08/20. Will be covid tested morning of procedure d/t will be >90 days since she was positive.

## 2020-05-18 DIAGNOSIS — U071 COVID-19: Secondary | ICD-10-CM | POA: Diagnosis not present

## 2020-05-18 DIAGNOSIS — I4821 Permanent atrial fibrillation: Secondary | ICD-10-CM | POA: Diagnosis not present

## 2020-05-18 DIAGNOSIS — I5032 Chronic diastolic (congestive) heart failure: Secondary | ICD-10-CM | POA: Diagnosis not present

## 2020-05-18 DIAGNOSIS — N183 Chronic kidney disease, stage 3 unspecified: Secondary | ICD-10-CM | POA: Diagnosis not present

## 2020-05-18 DIAGNOSIS — J441 Chronic obstructive pulmonary disease with (acute) exacerbation: Secondary | ICD-10-CM | POA: Diagnosis not present

## 2020-05-18 DIAGNOSIS — M6281 Muscle weakness (generalized): Secondary | ICD-10-CM | POA: Diagnosis not present

## 2020-05-18 DIAGNOSIS — Z794 Long term (current) use of insulin: Secondary | ICD-10-CM | POA: Diagnosis not present

## 2020-05-18 DIAGNOSIS — Z6841 Body Mass Index (BMI) 40.0 and over, adult: Secondary | ICD-10-CM | POA: Diagnosis not present

## 2020-05-18 DIAGNOSIS — E1122 Type 2 diabetes mellitus with diabetic chronic kidney disease: Secondary | ICD-10-CM | POA: Diagnosis not present

## 2020-05-18 DIAGNOSIS — I1310 Hypertensive heart and chronic kidney disease without heart failure, with stage 1 through stage 4 chronic kidney disease, or unspecified chronic kidney disease: Secondary | ICD-10-CM | POA: Diagnosis not present

## 2020-05-18 DIAGNOSIS — R2681 Unsteadiness on feet: Secondary | ICD-10-CM | POA: Diagnosis not present

## 2020-05-22 DIAGNOSIS — E211 Secondary hyperparathyroidism, not elsewhere classified: Secondary | ICD-10-CM | POA: Diagnosis not present

## 2020-05-22 DIAGNOSIS — E559 Vitamin D deficiency, unspecified: Secondary | ICD-10-CM | POA: Diagnosis not present

## 2020-05-22 DIAGNOSIS — E876 Hypokalemia: Secondary | ICD-10-CM | POA: Diagnosis not present

## 2020-05-22 DIAGNOSIS — E1129 Type 2 diabetes mellitus with other diabetic kidney complication: Secondary | ICD-10-CM | POA: Diagnosis not present

## 2020-05-22 DIAGNOSIS — I5032 Chronic diastolic (congestive) heart failure: Secondary | ICD-10-CM | POA: Diagnosis not present

## 2020-05-22 DIAGNOSIS — D631 Anemia in chronic kidney disease: Secondary | ICD-10-CM | POA: Diagnosis not present

## 2020-05-22 DIAGNOSIS — E1122 Type 2 diabetes mellitus with diabetic chronic kidney disease: Secondary | ICD-10-CM | POA: Diagnosis not present

## 2020-05-22 DIAGNOSIS — N189 Chronic kidney disease, unspecified: Secondary | ICD-10-CM | POA: Diagnosis not present

## 2020-05-22 DIAGNOSIS — R809 Proteinuria, unspecified: Secondary | ICD-10-CM | POA: Diagnosis not present

## 2020-05-23 DIAGNOSIS — E1122 Type 2 diabetes mellitus with diabetic chronic kidney disease: Secondary | ICD-10-CM | POA: Diagnosis not present

## 2020-05-23 DIAGNOSIS — U071 COVID-19: Secondary | ICD-10-CM | POA: Diagnosis not present

## 2020-05-23 DIAGNOSIS — I5032 Chronic diastolic (congestive) heart failure: Secondary | ICD-10-CM | POA: Diagnosis not present

## 2020-05-23 DIAGNOSIS — J441 Chronic obstructive pulmonary disease with (acute) exacerbation: Secondary | ICD-10-CM | POA: Diagnosis not present

## 2020-05-23 DIAGNOSIS — I1310 Hypertensive heart and chronic kidney disease without heart failure, with stage 1 through stage 4 chronic kidney disease, or unspecified chronic kidney disease: Secondary | ICD-10-CM | POA: Diagnosis not present

## 2020-05-23 DIAGNOSIS — N183 Chronic kidney disease, stage 3 unspecified: Secondary | ICD-10-CM | POA: Diagnosis not present

## 2020-05-24 DIAGNOSIS — J441 Chronic obstructive pulmonary disease with (acute) exacerbation: Secondary | ICD-10-CM | POA: Diagnosis not present

## 2020-05-24 DIAGNOSIS — N183 Chronic kidney disease, stage 3 unspecified: Secondary | ICD-10-CM | POA: Diagnosis not present

## 2020-05-24 DIAGNOSIS — I1310 Hypertensive heart and chronic kidney disease without heart failure, with stage 1 through stage 4 chronic kidney disease, or unspecified chronic kidney disease: Secondary | ICD-10-CM | POA: Diagnosis not present

## 2020-05-24 DIAGNOSIS — U071 COVID-19: Secondary | ICD-10-CM | POA: Diagnosis not present

## 2020-05-24 DIAGNOSIS — E1122 Type 2 diabetes mellitus with diabetic chronic kidney disease: Secondary | ICD-10-CM | POA: Diagnosis not present

## 2020-05-24 DIAGNOSIS — I5032 Chronic diastolic (congestive) heart failure: Secondary | ICD-10-CM | POA: Diagnosis not present

## 2020-05-27 ENCOUNTER — Ambulatory Visit: Payer: Medicare Other | Admitting: Hematology

## 2020-05-27 DIAGNOSIS — U071 COVID-19: Secondary | ICD-10-CM | POA: Diagnosis not present

## 2020-05-27 DIAGNOSIS — I5032 Chronic diastolic (congestive) heart failure: Secondary | ICD-10-CM | POA: Diagnosis not present

## 2020-05-27 DIAGNOSIS — J441 Chronic obstructive pulmonary disease with (acute) exacerbation: Secondary | ICD-10-CM | POA: Diagnosis not present

## 2020-05-27 DIAGNOSIS — N183 Chronic kidney disease, stage 3 unspecified: Secondary | ICD-10-CM | POA: Diagnosis not present

## 2020-05-27 DIAGNOSIS — E1122 Type 2 diabetes mellitus with diabetic chronic kidney disease: Secondary | ICD-10-CM | POA: Diagnosis not present

## 2020-05-27 DIAGNOSIS — I1310 Hypertensive heart and chronic kidney disease without heart failure, with stage 1 through stage 4 chronic kidney disease, or unspecified chronic kidney disease: Secondary | ICD-10-CM | POA: Diagnosis not present

## 2020-05-28 DIAGNOSIS — J441 Chronic obstructive pulmonary disease with (acute) exacerbation: Secondary | ICD-10-CM | POA: Diagnosis not present

## 2020-05-28 DIAGNOSIS — E1122 Type 2 diabetes mellitus with diabetic chronic kidney disease: Secondary | ICD-10-CM | POA: Diagnosis not present

## 2020-05-28 DIAGNOSIS — I5032 Chronic diastolic (congestive) heart failure: Secondary | ICD-10-CM | POA: Diagnosis not present

## 2020-05-28 DIAGNOSIS — N183 Chronic kidney disease, stage 3 unspecified: Secondary | ICD-10-CM | POA: Diagnosis not present

## 2020-05-28 DIAGNOSIS — I1310 Hypertensive heart and chronic kidney disease without heart failure, with stage 1 through stage 4 chronic kidney disease, or unspecified chronic kidney disease: Secondary | ICD-10-CM | POA: Diagnosis not present

## 2020-05-28 DIAGNOSIS — U071 COVID-19: Secondary | ICD-10-CM | POA: Diagnosis not present

## 2020-05-29 ENCOUNTER — Ambulatory Visit: Payer: Medicare Other | Admitting: Hematology

## 2020-05-30 DIAGNOSIS — N183 Chronic kidney disease, stage 3 unspecified: Secondary | ICD-10-CM | POA: Diagnosis not present

## 2020-05-30 DIAGNOSIS — I1310 Hypertensive heart and chronic kidney disease without heart failure, with stage 1 through stage 4 chronic kidney disease, or unspecified chronic kidney disease: Secondary | ICD-10-CM | POA: Diagnosis not present

## 2020-05-30 DIAGNOSIS — I5032 Chronic diastolic (congestive) heart failure: Secondary | ICD-10-CM | POA: Diagnosis not present

## 2020-05-30 DIAGNOSIS — J441 Chronic obstructive pulmonary disease with (acute) exacerbation: Secondary | ICD-10-CM | POA: Diagnosis not present

## 2020-05-30 DIAGNOSIS — U071 COVID-19: Secondary | ICD-10-CM | POA: Diagnosis not present

## 2020-05-30 DIAGNOSIS — E1122 Type 2 diabetes mellitus with diabetic chronic kidney disease: Secondary | ICD-10-CM | POA: Diagnosis not present

## 2020-06-03 DIAGNOSIS — I1310 Hypertensive heart and chronic kidney disease without heart failure, with stage 1 through stage 4 chronic kidney disease, or unspecified chronic kidney disease: Secondary | ICD-10-CM | POA: Diagnosis not present

## 2020-06-03 DIAGNOSIS — I5032 Chronic diastolic (congestive) heart failure: Secondary | ICD-10-CM | POA: Diagnosis not present

## 2020-06-03 DIAGNOSIS — N183 Chronic kidney disease, stage 3 unspecified: Secondary | ICD-10-CM | POA: Diagnosis not present

## 2020-06-03 DIAGNOSIS — E1122 Type 2 diabetes mellitus with diabetic chronic kidney disease: Secondary | ICD-10-CM | POA: Diagnosis not present

## 2020-06-03 DIAGNOSIS — J441 Chronic obstructive pulmonary disease with (acute) exacerbation: Secondary | ICD-10-CM | POA: Diagnosis not present

## 2020-06-03 DIAGNOSIS — U071 COVID-19: Secondary | ICD-10-CM | POA: Diagnosis not present

## 2020-06-03 NOTE — Progress Notes (Incomplete)
HEMATOLOGY/ONCOLOGY CONSULTATION NOTE  Date of Service: 06/03/2020  Patient Care Team: Rosita Fire, MD as PCP - General (Internal Medicine) Satira Sark, MD as PCP - Cardiology (Cardiology) Danie Binder, MD (Inactive) (Gastroenterology) [name] Cristopher Estimable, MD as Consulting Physician (Gastroenterology)  CHIEF COMPLAINTS/PURPOSE OF CONSULTATION:  Lung Cancer  HISTORY OF PRESENTING ILLNESS:  [name] is a wonderful 65 y.o. female who has been referred to [place] by Dr. Rosita Fire, MD for evaluation and management of lung cancer. The pt reports that she is doing well overall.  The pt reports ***  On review of systems, pt reports *** and denies *** and any other symptoms.  MEDICAL HISTORY:  Past Medical History:  Diagnosis Date  . Arthritis   . Asthma   . Atrial fibrillation ([place])   . Carcinoid tumor determined by biopsy of lung 05/2018   left lung  . Chronic diastolic CHF (congestive heart failure) ([place])   . CKD (chronic kidney disease), stage III ([place])   . Diabetes mellitus type 2 in obese ([place])   . Hypertension   . Iron deficiency anemia 10/16/2010  . Mental handicap 10/16/2010  . Mild CAD 2013  . Morbid obesity ([place])   . OSA (obstructive sleep apnea)     SURGICAL HISTORY: Past Surgical History:  Procedure Laterality Date  . ABDOMINAL HYSTERECTOMY    . AV FISTULA PLACEMENT Right 06/06/2018   Procedure: ARTERIOVENOUS (AV) FISTULA CREATION;  Surgeon: Marty Heck, MD;  Location: [facility];  Service: Vascular;  Laterality: Right;  . CESAREAN SECTION    . CHOLECYSTECTOMY    . COLONOSCOPY  08/2010   normal TI, sigmoid polyp (adenoma ). Next TCS due  08/2015,  . ESOPHAGOGASTRODUODENOSCOPY  08/2010   antral and duodenal erosions s/p bx (chronic gastritis, no h.pylori, no celiac dz ), hiatal hernia  . FISTULA SUPERFICIALIZATION Right 08/25/2018   Procedure: FISTULA SUPERFICIALIZATION RIGHT ARM;  Surgeon: Waynetta Sandy, MD;  Location: [facility];   Service: Cardiovascular;  Laterality: Right;  . IR FLUORO GUIDE CV LINE RIGHT  05/31/2018  . IR US GUIDE VASC ACCESS RIGHT  05/31/2018  . KNEE SURGERY     right knee @ 65 years of age  . LEFT AND RIGHT HEART CATHETERIZATION WITH CORONARY ANGIOGRAM N/A 09/21/2011   Procedure: LEFT AND RIGHT HEART CATHETERIZATION WITH CORONARY ANGIOGRAM;  Surgeon: Birdie Riddle, MD;  Location: [facility];  Service: Cardiovascular;  Laterality: N/A;    SOCIAL HISTORY: Social History   Socioeconomic History  . Marital status: Divorced    Spouse name: Not on file  . Number of children: 1  . Years of education: Not on file  . Highest education level: Not on file  Occupational History    Employer: UNEMPLOYED  Tobacco Use  . Smoking status: Former Smoker    Packs/day: 1.00    Years: 0.00    Pack years: 0.00    Types: Cigarettes    Quit date: 04/11/1996    Years since quitting: 24.1  . Smokeless tobacco: Never Used  . Tobacco comment: quit a couple year ago  Vaping Use  . Vaping Use: Never used  Substance and Sexual Activity  . Alcohol use: No  . Drug use: No  . Sexual activity: Never  Other Topics Concern  . Not on file  Social History Narrative   Lives with parents.    Social Determinants of Health   Financial Resource Strain: Not on file  Food Insecurity: Not  on file  Transportation Needs: Not on file  Physical Activity: Not on file  Stress: Not on file  Social Connections: Not on file  Intimate Partner Violence: Not on file    FAMILY HISTORY: Family History  Problem Relation Age of Onset  . Diabetes Mother   . Hypertension Mother   . Heart failure Mother   . Hypertension Father   . Atrial fibrillation Father   . Kidney disease Other        son reportedly has had cyst on kidney and lung s/p surgery at [facility]  . Hypertension Son   . Colon cancer Neg Hx     ALLERGIES:  has No Known Allergies.  MEDICATIONS:  Current Outpatient Medications  Medication Sig Dispense Refill  .  acetaminophen (TYLENOL) 325 MG tablet Take 325 mg by mouth every 12 (twelve) hours as needed for moderate pain.    Marland Kitchen albuterol (PROVENTIL HFA;VENTOLIN HFA) 108 (90 Base) MCG/ACT inhaler Inhale 2 puffs into the lungs every 6 (six) hours as needed for shortness of breath.    Marland Kitchen apixaban (ELIQUIS) 5 MG TABS tablet Take 1 tablet (5 mg total) by mouth 2 (two) times daily. 60 tablet 0  . betamethasone dipropionate (DIPROLENE) 0.05 % ointment Apply 1 application topically daily as needed (for rash under breasts).     . cetirizine (ZYRTEC) 10 MG tablet Take 10 mg by mouth daily.    . clotrimazole (LOTRIMIN) 1 % cream Apply 1 application topically daily as needed (under breasts as needed for rash).    . DIABETIC TUSSIN EX 100 MG/5ML syrup Take 100 mg by mouth every 4 (four) hours as needed for cough or congestion.     Marland Kitchen diltiazem (CARDIZEM CD) 240 MG 24 hr capsule Take 1 capsule (240 mg total) by mouth daily. 30 capsule 0  . fluticasone (FLONASE) 50 MCG/ACT nasal spray Place 2 sprays into both nostrils daily as needed for allergies or rhinitis.    . Fluticasone-Umeclidin-Vilant 100-62.5-25 MCG/INH AEPB Inhale 1 puff into the lungs daily.     . furosemide (LASIX) 40 MG tablet Take 40 mg by mouth See admin instructions. Take one tablet by mouth TWICE DAILY ALTERNATING with taking one tablet by mouth ONCE DAILY    . glucose blood (EASYMAX TEST) test strip Use to check blood sugar 4 times daily. 200 strip 2  . guaiFENesin (MUCINEX) 600 MG 12 hr tablet Take 1 tablet (600 mg total) by mouth 2 (two) times daily as needed. 60 tablet 11  . hydrALAZINE (APRESOLINE) 25 MG tablet Take 1 tablet (25 mg total) by mouth every 8 (eight) hours for 30 days. (Patient taking differently: Take 25 mg by mouth 3 (three) times daily.) 90 tablet 0  . insulin regular human CONCENTRATED (HUMULIN R U-500 KWIKPEN) 500 UNIT/ML kwikpen INJECT 40 units SUBCUTANEOUSLY with breakfast, 40 units with lunch and supper WHEN BLOOD SUGAR IS GREATER  THAN 90mg /dL. 6 mL 0  . ipratropium-albuterol (DUONEB) 0.5-2.5 (3) MG/3ML SOLN Take 3 mLs by nebulization every 6 (six) hours as needed (for cough).     . isosorbide dinitrate (ISORDIL) 10 MG tablet Take 1 tablet (10 mg total) by mouth 3 (three) times daily. 90 tablet 0  . Lancets 28G MISC USE TO CHECK BLOOD GLUCOSE FOUR TIMES DAILY 200 each 1  . metolazone (ZAROXOLYN) 5 MG tablet Take 5 mg by mouth daily.     . metoprolol tartrate (LOPRESSOR) 100 MG tablet Take 0.5 tablets (50 mg total) by mouth 2 (two) times  daily.    . montelukast (SINGULAIR) 10 MG tablet Take 10 mg by mouth at bedtime.     . polyethylene glycol powder (GLYCOLAX/MIRALAX) 17 GM/SCOOP powder Take 17 g by mouth daily as needed for mild constipation or moderate constipation.    . potassium chloride SA (KLOR-CON) 20 MEQ tablet Take 20 mEq by mouth 2 (two) times daily.    . rosuvastatin (CRESTOR) 20 MG tablet TAKE (1) TABLET BY MOUTH ONCE DAILY. 30 tablet 3  . triamcinolone cream (KENALOG) 0.5 % Apply 1 application topically every 12 (twelve) hours as needed (applied to bilateral legs for allergic dermatitis).     . Vitamin D, Cholecalciferol, 25 MCG (1000 UT) TABS Take 1,000 Units by mouth daily.      No current facility-administered medications for this visit.    REVIEW OF SYSTEMS:   10 Point review of Systems was done is negative except as noted above.  PHYSICAL EXAMINATION: ECOG PERFORMANCE STATUS: {CHL ONC ECOG YQ:[id number]}  .There were no vitals filed for this visit. There were no vitals filed for this visit. .There is no height or weight on file to calculate BMI.  *** GENERAL:alert, in no acute distress and comfortable SKIN: no acute rashes, no significant lesions EYES: conjunctiva are pink and non-injected, sclera anicteric OROPHARYNX: MMM, no exudates, no oropharyngeal erythema or ulceration NECK: supple, no JVD LYMPH:  no palpable lymphadenopathy in the cervical, axillary or inguinal regions LUNGS: clear to  auscultation b/l with normal respiratory effort HEART: regular rate & rhythm ABDOMEN:  normoactive bowel sounds , non tender, not distended. Extremity: no pedal edema PSYCH: alert & oriented x 3 with fluent speech NEURO: no focal motor/sensory deficits  LABORATORY DATA:  I have reviewed the data as listed  . CBC Latest Ref Rng & Units 09/20/2019 09/19/2019 08/25/2018  WBC 4.0 - 10.5 K/uL 8.9 14.8(H) -  Hemoglobin 12.0 - 15.0 g/dL 10.1(L) 10.9(L) 11.9(L)  Hematocrit 36.0 - 46.0 % 31.5(L) 33.9(L) 35.0(L)  Platelets 150 - 400 K/uL 228 353 -    . CMP Latest Ref Rng & Units 05/07/2020 04/23/2020 10/16/2019  Glucose 65 - 99 mg/dL - 188(H) 233(H)  BUN 4 - 21 48(A) 37(H) 52(H)  Creatinine 0.5 - 1.1 1.6(A) 1.64(H) 1.59(H)  Sodium 137 - 147 141 140 136  Potassium 3.4 - 5.3 3.5 4.5 3.5  Chloride 99 - 108 100 100 95(L)  CO2 13 - 22 31(A) 26 30  Calcium 8.7 - 10.7 9.0 9.0 9.4  Total Protein 6.0 - 8.5 g/dL - 6.9 -  Total Bilirubin 0.0 - 1.2 mg/dL - 0.2 -  Alkaline Phos 25 - 125 72 83 -  AST 13 - 35 13 15 -  ALT 7 - 35 10 12 -     RADIOGRAPHIC STUDIES: I have personally reviewed the radiological images as listed and agreed with the findings in the report. DG Chest 2 View  Result Date: 05/08/2020 CLINICAL DATA:  Shortness of breath and cough EXAM: CHEST - 2 VIEW COMPARISON:  September 19, 2019 chest radiograph and PET-CT December 22, 2018 FINDINGS: There is atelectatic change in the left base. No edema or airspace opacity. There are areas of interstitial thickening, stable. No parenchymal nodular lesions evident by radiography. There is cardiomegaly with pulmonary vascularity within normal limits. No adenopathy. No bone lesions. IMPRESSION: No edema or airspace opacity. No parenchymal lung nodular lesions evident by radiography. Mild left base atelectasis. No consolidation. Interstitial thickening may indicate a degree of chronic bronchitis. Stable cardiomegaly.  No  adenopathy evident. Electronically  Signed   By: Lowella Grip III M.D.   On: 05/08/2020 08:59    ASSESSMENT & PLAN:    PLAN: -***   FOLLOW UP: ***    All of the patients questions were answered with apparent satisfaction. The patient knows to call the clinic with any problems, questions or concerns.  I spent {CHL ONC TIME VISIT - MLYYT:[id number]} counseling the patient face to face. The total time spent in the appointment was {CHL ONC TIME VISIT - XNTZG:[id number]} and more than 50% was on counseling and direct patient cares.    Sullivan Lone MD [place] AAHIVMS [facility] Hematology/Oncology Physician [facility]  (Office):       [phone] (Work cell):  [phone] (Fax):           [phone]  06/03/2020 3:31 PM  I, Reinaldo Raddle, am acting as scribe for Dr. Sullivan Lone, MD.

## 2020-06-04 ENCOUNTER — Other Ambulatory Visit: Payer: Self-pay

## 2020-06-04 ENCOUNTER — Inpatient Hospital Stay: Payer: Medicare Other | Attending: Hematology | Admitting: Hematology

## 2020-06-04 ENCOUNTER — Telehealth: Payer: Self-pay | Admitting: Hematology

## 2020-06-04 ENCOUNTER — Inpatient Hospital Stay: Payer: Medicare Other

## 2020-06-04 VITALS — BP 148/63 | HR 59 | Temp 97.9°F | Resp 18 | Ht 64.0 in | Wt 257.8 lb

## 2020-06-04 DIAGNOSIS — I129 Hypertensive chronic kidney disease with stage 1 through stage 4 chronic kidney disease, or unspecified chronic kidney disease: Secondary | ICD-10-CM | POA: Diagnosis not present

## 2020-06-04 DIAGNOSIS — E1122 Type 2 diabetes mellitus with diabetic chronic kidney disease: Secondary | ICD-10-CM | POA: Insufficient documentation

## 2020-06-04 DIAGNOSIS — D3A090 Benign carcinoid tumor of the bronchus and lung: Secondary | ICD-10-CM

## 2020-06-04 DIAGNOSIS — N183 Chronic kidney disease, stage 3 unspecified: Secondary | ICD-10-CM | POA: Diagnosis not present

## 2020-06-04 DIAGNOSIS — C7A090 Malignant carcinoid tumor of the bronchus and lung: Secondary | ICD-10-CM | POA: Insufficient documentation

## 2020-06-04 LAB — CMP (CANCER CENTER ONLY)
ALT: 11 U/L (ref 0–44)
AST: 13 U/L — ABNORMAL LOW (ref 15–41)
Albumin: 3.4 g/dL — ABNORMAL LOW (ref 3.5–5.0)
Alkaline Phosphatase: 81 U/L (ref 38–126)
Anion gap: 8 (ref 5–15)
BUN: 49 mg/dL — ABNORMAL HIGH (ref 8–23)
CO2: 32 mmol/L (ref 22–32)
Calcium: 9.2 mg/dL (ref 8.9–10.3)
Chloride: 96 mmol/L — ABNORMAL LOW (ref 98–111)
Creatinine: 2.09 mg/dL — ABNORMAL HIGH (ref 0.44–1.00)
GFR, Estimated: 26 mL/min — ABNORMAL LOW (ref >60)
Glucose, Bld: 261 mg/dL — ABNORMAL HIGH (ref 70–99)
Potassium: 3.6 mmol/L (ref 3.5–5.1)
Sodium: 136 mmol/L (ref 135–145)
Total Bilirubin: 0.3 mg/dL (ref 0.3–1.2)
Total Protein: 7.7 g/dL (ref 6.5–8.1)

## 2020-06-04 LAB — CBC WITH DIFFERENTIAL/PLATELET
Abs Immature Granulocytes: 0.03 10*3/uL (ref 0.00–0.07)
Basophils Absolute: 0.1 10*3/uL (ref 0.0–0.1)
Basophils Relative: 1 %
Eosinophils Absolute: 0.2 10*3/uL (ref 0.0–0.5)
Eosinophils Relative: 2 %
HCT: 31.6 % — ABNORMAL LOW (ref 36.0–46.0)
Hemoglobin: 9.8 g/dL — ABNORMAL LOW (ref 12.0–15.0)
Immature Granulocytes: 0 %
Lymphocytes Relative: 15 %
Lymphs Abs: 1.2 10*3/uL (ref 0.7–4.0)
MCH: 26.8 pg (ref 26.0–34.0)
MCHC: 31 g/dL (ref 30.0–36.0)
MCV: 86.3 fL (ref 80.0–100.0)
Monocytes Absolute: 0.8 10*3/uL (ref 0.1–1.0)
Monocytes Relative: 10 %
Neutro Abs: 5.9 10*3/uL (ref 1.7–7.7)
Neutrophils Relative %: 72 %
Platelets: 250 10*3/uL (ref 150–400)
RBC: 3.66 MIL/uL — ABNORMAL LOW (ref 3.87–5.11)
RDW: 15.2 % (ref 11.5–15.5)
WBC: 8.2 10*3/uL (ref 4.0–10.5)
nRBC: 0 % (ref 0.0–0.2)

## 2020-06-04 NOTE — Progress Notes (Signed)
HEMATOLOGY/ONCOLOGY CONSULTATION NOTE  Date of Service: 06/04/2020  Patient Care Team: Rosita Fire, MD as PCP - General (Internal Medicine) Satira Sark, MD as PCP - Cardiology (Cardiology) Danie Binder, MD (Inactive) (Gastroenterology) [name] Cristopher Estimable, MD as Consulting Physician (Gastroenterology)  [place] [phone] Legal Guardian [employer]  CHIEF COMPLAINTS/PURPOSE OF CONSULTATION:  Bronchopulmonary Carcinoid  HISTORY OF PRESENTING ILLNESS:  [name] is a65 y.o.F with mental disability in LTC, asthma well-controlled, CKD III Baseline Cr 1.3-1.6, HTN, and DM who presented with severe dyspnea requiring BiPAP by EMS. On arrival in the ER was in V. tach, cardiology consulted and given amiodarone bolus, then developed unstable wide-complex tachycardia and acute respiratory failure. Creatinine 4.3, potassium 7.5 on admission, pH 7.15, PCO2 45, lactic acid 3.2, chest x-ray pneumonia. Temporizing potassium measures given, broad-spectrum antibiotics started, intubated and transferred to ICU at [facility]. Extubated 2/28 after 6 days.  The patient was recently admitted to the hospital earlier in February 2020 for community-acquired pneumonia.  During her hospitalization, the patient had a CT of the chest performed on 05/13/2018 which showed a masslike consolidation measuring 5 x 8.2 cm over the left lower lobe.  It was recommended that she have a follow-up CT scan performed within the next month as an outpatient.  During this current hospitalization, it was recommended that she undergo TEE and bronchoscopy.  This was performed on 05/26/2018.  Biopsy results showed typical carcinoid tumor and atypical squamous metaplasia, favor reactive.    When seen today, the patient had just finished dialysis.  She reports having fatigue and a cough.  She states that she has occasional blood-tinged sputum when she coughs.  She denies fevers and chills.  Denies chest pain and shortness  of breath.  Denies abdominal discomfort, nausea, vomiting, constipation, diarrhea.  She reports intermittent headaches. Oncology was consulted for recommendations regarding her carcinoid tumor.  Of note, the patient reside in an assisted living facility in [place], [place].  She tells me that she has a son who visits her regularly but he is not her power of attorney.  She has a guardian [name]) who oversees her care.  Telephone numbers are on the chart.  Interval History:   [name] returns today for management and evaluation of her Bronchopulmonary carcinoid. The patient was lost to follow up and her last visit with [place] was on 09/22/2018. She was referred again for evaluation and management of lung cancer by Dr. Rosita Fire, MD. The pt reports that she is doing well overall. We are joined today by a helper from her facility.  The pt reports that she is not on dialysis for over a year and has been more physically active.The pt had an X-Ray on 05/07/2020 that revealed no parenchymal lung nodules. The pt has a colonoscopy scheduled for April 2022. She is unsure of why she was referred back other than a follow up appointment. The pt notes that she recently has had a cough and phlegm. She has been taking antibiotics for this and it is much improved over the last month. The pt feels no different now than in the last 3-6 months. The pt still lives at [facility] facility. The pt notes her legal guardian has not changed since the last visit that she knows of.   On review of systems, pt reports recent infection and denies sudden weight loss, skin rashes, changes in bowel habits, leg swelling, abdominal pain, decreased appetite, and any other symptoms.  MEDICAL HISTORY:  Past Medical History:  Diagnosis Date  . Arthritis   . Asthma   . Atrial fibrillation ([place])   . Carcinoid tumor determined by biopsy of lung 05/2018   left lung  . Chronic diastolic CHF (congestive heart failure)  ([place])   . CKD (chronic kidney disease), stage III ([place])   . Diabetes mellitus type 2 in obese ([place])   . Hypertension   . Iron deficiency anemia 10/16/2010  . Mental handicap 10/16/2010  . Mild CAD 2013  . Morbid obesity ([place])   . OSA (obstructive sleep apnea)     SURGICAL HISTORY: Past Surgical History:  Procedure Laterality Date  . ABDOMINAL HYSTERECTOMY    . AV FISTULA PLACEMENT Right 06/06/2018   Procedure: ARTERIOVENOUS (AV) FISTULA CREATION;  Surgeon: Marty Heck, MD;  Location: [facility];  Service: Vascular;  Laterality: Right;  . CESAREAN SECTION    . CHOLECYSTECTOMY    . COLONOSCOPY  08/2010   normal TI, sigmoid polyp (adenoma ). Next TCS due  08/2015,  . ESOPHAGOGASTRODUODENOSCOPY  08/2010   antral and duodenal erosions s/p bx (chronic gastritis, no h.pylori, no celiac dz ), hiatal hernia  . FISTULA SUPERFICIALIZATION Right 08/25/2018   Procedure: FISTULA SUPERFICIALIZATION RIGHT ARM;  Surgeon: Waynetta Sandy, MD;  Location: [facility];  Service: Cardiovascular;  Laterality: Right;  . IR FLUORO GUIDE CV LINE RIGHT  05/31/2018  . IR US GUIDE VASC ACCESS RIGHT  05/31/2018  . KNEE SURGERY     right knee @ 65 years of age  . LEFT AND RIGHT HEART CATHETERIZATION WITH CORONARY ANGIOGRAM N/A 09/21/2011   Procedure: LEFT AND RIGHT HEART CATHETERIZATION WITH CORONARY ANGIOGRAM;  Surgeon: Birdie Riddle, MD;  Location: [facility] CATH LAB;  Service: Cardiovascular;  Laterality: N/A;    SOCIAL HISTORY: Social History   Socioeconomic History  . Marital status: Divorced    Spouse name: Not on file  . Number of children: 1  . Years of education: Not on file  . Highest education level: Not on file  Occupational History    Employer: UNEMPLOYED  Tobacco Use  . Smoking status: Former Smoker    Packs/day: 1.00    Years: 0.00    Pack years: 0.00    Types: Cigarettes    Quit date: 04/11/1996    Years since quitting: 24.1  . Smokeless tobacco: Never Used  . Tobacco comment: quit a couple  year ago  Vaping Use  . Vaping Use: Never used  Substance and Sexual Activity  . Alcohol use: No  . Drug use: No  . Sexual activity: Never  Other Topics Concern  . Not on file  Social History Narrative   Lives with parents.    Social Determinants of Health   Financial Resource Strain: Not on file  Food Insecurity: Not on file  Transportation Needs: Not on file  Physical Activity: Not on file  Stress: Not on file  Social Connections: Not on file  Intimate Partner Violence: Not on file    FAMILY HISTORY: Family History  Problem Relation Age of Onset  . Diabetes Mother   . Hypertension Mother   . Heart failure Mother   . Hypertension Father   . Atrial fibrillation Father   . Kidney disease Other        son reportedly has had cyst on kidney and lung s/p surgery at [facility]  . Hypertension Son   . Colon cancer Neg Hx     ALLERGIES:  has No  Known Allergies.  MEDICATIONS:  Current Outpatient Medications  Medication Sig Dispense Refill  . acetaminophen (TYLENOL) 325 MG tablet Take 325 mg by mouth every 12 (twelve) hours as needed for moderate pain.    Marland Kitchen albuterol (PROVENTIL HFA;VENTOLIN HFA) 108 (90 Base) MCG/ACT inhaler Inhale 2 puffs into the lungs every 6 (six) hours as needed for shortness of breath.    Marland Kitchen apixaban (ELIQUIS) 5 MG TABS tablet Take 1 tablet (5 mg total) by mouth 2 (two) times daily. 60 tablet 0  . betamethasone dipropionate (DIPROLENE) 0.05 % ointment Apply 1 application topically daily as needed (for rash under breasts).     . cetirizine (ZYRTEC) 10 MG tablet Take 10 mg by mouth daily.    . clotrimazole (LOTRIMIN) 1 % cream Apply 1 application topically daily as needed (under breasts as needed for rash).    . DIABETIC TUSSIN EX 100 MG/5ML syrup Take 100 mg by mouth every 4 (four) hours as needed for cough or congestion.     Marland Kitchen diltiazem (CARDIZEM CD) 240 MG 24 hr capsule Take 1 capsule (240 mg total) by mouth daily. 30 capsule 0  . fluticasone (FLONASE) 50  MCG/ACT nasal spray Place 2 sprays into both nostrils daily as needed for allergies or rhinitis.    . Fluticasone-Umeclidin-Vilant 100-62.5-25 MCG/INH AEPB Inhale 1 puff into the lungs daily.     . furosemide (LASIX) 40 MG tablet Take 40 mg by mouth See admin instructions. Take one tablet by mouth TWICE DAILY ALTERNATING with taking one tablet by mouth ONCE DAILY    . glucose blood (EASYMAX TEST) test strip Use to check blood sugar 4 times daily. 200 strip 2  . guaiFENesin (MUCINEX) 600 MG 12 hr tablet Take 1 tablet (600 mg total) by mouth 2 (two) times daily as needed. 60 tablet 11  . hydrALAZINE (APRESOLINE) 25 MG tablet Take 1 tablet (25 mg total) by mouth every 8 (eight) hours for 30 days. (Patient taking differently: Take 25 mg by mouth 3 (three) times daily.) 90 tablet 0  . insulin regular human CONCENTRATED (HUMULIN R U-500 KWIKPEN) 500 UNIT/ML kwikpen INJECT 40 units SUBCUTANEOUSLY with breakfast, 40 units with lunch and supper WHEN BLOOD SUGAR IS GREATER THAN 90mg /dL. 6 mL 0  . ipratropium-albuterol (DUONEB) 0.5-2.5 (3) MG/3ML SOLN Take 3 mLs by nebulization every 6 (six) hours as needed (for cough).     . isosorbide dinitrate (ISORDIL) 10 MG tablet Take 1 tablet (10 mg total) by mouth 3 (three) times daily. 90 tablet 0  . Lancets 28G MISC USE TO CHECK BLOOD GLUCOSE FOUR TIMES DAILY 200 each 1  . metolazone (ZAROXOLYN) 5 MG tablet Take 5 mg by mouth daily.     . metoprolol tartrate (LOPRESSOR) 100 MG tablet Take 0.5 tablets (50 mg total) by mouth 2 (two) times daily.    . montelukast (SINGULAIR) 10 MG tablet Take 10 mg by mouth at bedtime.     . polyethylene glycol powder (GLYCOLAX/MIRALAX) 17 GM/SCOOP powder Take 17 g by mouth daily as needed for mild constipation or moderate constipation.    . potassium chloride SA (KLOR-CON) 20 MEQ tablet Take 20 mEq by mouth 2 (two) times daily.    . rosuvastatin (CRESTOR) 20 MG tablet TAKE (1) TABLET BY MOUTH ONCE DAILY. 30 tablet 3  . triamcinolone  cream (KENALOG) 0.5 % Apply 1 application topically every 12 (twelve) hours as needed (applied to bilateral legs for allergic dermatitis).     . Vitamin D, Cholecalciferol, 25 MCG (  1000 UT) TABS Take 1,000 Units by mouth daily.      No current facility-administered medications for this visit.    REVIEW OF SYSTEMS:   10 Point review of Systems was done is negative except as noted above.  PHYSICAL EXAMINATION: ECOG PERFORMANCE STATUS: 3 - Symptomatic, >50% confined to bed  . Vitals:   06/04/20 1252  BP: (!) 148/63  Pulse: (!) 59  Resp: 18  Temp: 97.9 F (36.6 C)  SpO2: 99%   Filed Weights   06/04/20 1252  Weight: 257 lb 12.8 oz (116.9 kg)   .Body mass index is 44.25 kg/m.  Exam was given in a wheelchair.   GENERAL:alert, in no acute distress and comfortable SKIN: no acute rashes, no significant lesions EYES: conjunctiva are pink and non-injected, sclera anicteric OROPHARYNX: MMM, no exudates, no oropharyngeal erythema or ulceration NECK: supple, no JVD LYMPH:  no palpable lymphadenopathy in the cervical, axillary or inguinal regions LUNGS: clear to auscultation b/l with normal respiratory effort HEART: regular rate & rhythm ABDOMEN:  normoactive bowel sounds , non tender, not distended. Extremity: no pedal edema PSYCH: alert & oriented x 3 with fluent speech NEURO: no focal motor/sensory deficits  LABORATORY DATA:  I have reviewed the data as listed  . CBC Latest Ref Rng & Units 06/04/2020 09/20/2019 09/19/2019  WBC 4.0 - 10.5 K/uL 8.2 8.9 14.8(H)  Hemoglobin 12.0 - 15.0 g/dL 9.8(L) 10.1(L) 10.9(L)  Hematocrit 36.0 - 46.0 % 31.6(L) 31.5(L) 33.9(L)  Platelets 150 - 400 K/uL 250 228 353    . CMP Latest Ref Rng & Units 06/04/2020 05/07/2020 04/23/2020  Glucose 70 - 99 mg/dL 261(H) - 188(H)  BUN 8 - 23 mg/dL 49(H) 48(A) 37(H)  Creatinine 0.44 - 1.00 mg/dL 2.09(H) 1.6(A) 1.64(H)  Sodium 135 - 145 mmol/L 136 141 140  Potassium 3.5 - 5.1 mmol/L 3.6 3.5 4.5  Chloride 98 -  111 mmol/L 96(L) 100 100  CO2 22 - 32 mmol/L 32 31(A) 26  Calcium 8.9 - 10.3 mg/dL 9.2 9.0 9.0  Total Protein 6.5 - 8.1 g/dL 7.7 - 6.9  Total Bilirubin 0.3 - 1.2 mg/dL 0.3 - 0.2  Alkaline Phos 38 - 126 U/L 81 72 83  AST 15 - 41 U/L 13(L) 13 15  ALT 0 - 44 U/L 11 10 12     05/26/18 Biopsy:    RADIOGRAPHIC STUDIES: I have personally reviewed the radiological images as listed and agreed with the findings in the report. DG Chest 2 View  Result Date: 05/08/2020 CLINICAL DATA:  Shortness of breath and cough EXAM: CHEST - 2 VIEW COMPARISON:  September 19, 2019 chest radiograph and PET-CT December 22, 2018 FINDINGS: There is atelectatic change in the left base. No edema or airspace opacity. There are areas of interstitial thickening, stable. No parenchymal nodular lesions evident by radiography. There is cardiomegaly with pulmonary vascularity within normal limits. No adenopathy. No bone lesions. IMPRESSION: No edema or airspace opacity. No parenchymal lung nodular lesions evident by radiography. Mild left base atelectasis. No consolidation. Interstitial thickening may indicate a degree of chronic bronchitis. Stable cardiomegaly.  No adenopathy evident. Electronically Signed   By: Lowella Grip III M.D.   On: 05/08/2020 08:59    ASSESSMENT & PLAN:  65 y.o. female with  1.  Typical carcinoid tumor - lung primary  2.  Anemia: Likely due to her renal disease. No transfusion is indicated.   She is currently on Aranesp.  3.  Kidney disease:  Management per nephrology.  PLAN: -  Advised pt the bronchopulmonary carcinoid that she has is benign and slow growing at this time. Advised that these cases are often indicative of surgeries if the pt is eligible. -Discussed pt last x-ray last month; no nodules were detected.  -Recommended CT Chest  in 2 months to allow current infection to settle down. -Advised pt that we will continue to monitor. -Will get labs today. -Will see back in 8 weeks with labs.     FOLLOW UP: Labs today CT chest in 7 weeks RTC with Dr Irene Limbo in 8 weeks   All of the patients questions were answered with apparent satisfaction. The patient knows to call the clinic with any problems, questions or concerns.  The total time spent in the appointment was 20 minutes and more than 50% was on counseling and direct patient cares.     Sullivan Lone MD [place] AAHIVMS [facility] Hematology/Oncology Physician [facility]  (Office):       [phone] (Work cell):  [phone] (Fax):           [phone]  06/04/2020 12:59 PM  I, Reinaldo Raddle, am acting as scribe for Dr. Sullivan Lone, MD.   .I have reviewed the above documentation for accuracy and completeness, and I agree with the above. Brunetta Genera MD

## 2020-06-04 NOTE — Telephone Encounter (Signed)
Scheduled appointment per 3/8 los. Spoke to patient who is aware of appointment date and time.

## 2020-06-05 ENCOUNTER — Other Ambulatory Visit: Payer: Self-pay | Admitting: "Endocrinology

## 2020-06-05 DIAGNOSIS — I1310 Hypertensive heart and chronic kidney disease without heart failure, with stage 1 through stage 4 chronic kidney disease, or unspecified chronic kidney disease: Secondary | ICD-10-CM | POA: Diagnosis not present

## 2020-06-05 DIAGNOSIS — J441 Chronic obstructive pulmonary disease with (acute) exacerbation: Secondary | ICD-10-CM | POA: Diagnosis not present

## 2020-06-05 DIAGNOSIS — I5032 Chronic diastolic (congestive) heart failure: Secondary | ICD-10-CM | POA: Diagnosis not present

## 2020-06-05 DIAGNOSIS — U071 COVID-19: Secondary | ICD-10-CM | POA: Diagnosis not present

## 2020-06-05 DIAGNOSIS — N183 Chronic kidney disease, stage 3 unspecified: Secondary | ICD-10-CM | POA: Diagnosis not present

## 2020-06-05 DIAGNOSIS — E1122 Type 2 diabetes mellitus with diabetic chronic kidney disease: Secondary | ICD-10-CM | POA: Diagnosis not present

## 2020-06-06 DIAGNOSIS — J449 Chronic obstructive pulmonary disease, unspecified: Secondary | ICD-10-CM | POA: Diagnosis not present

## 2020-06-06 DIAGNOSIS — E1165 Type 2 diabetes mellitus with hyperglycemia: Secondary | ICD-10-CM | POA: Diagnosis not present

## 2020-06-06 LAB — CHROMOGRANIN A: Chromogranin A (ng/mL): 202.5 ng/mL — ABNORMAL HIGH (ref 0.0–101.8)

## 2020-06-11 DIAGNOSIS — I1310 Hypertensive heart and chronic kidney disease without heart failure, with stage 1 through stage 4 chronic kidney disease, or unspecified chronic kidney disease: Secondary | ICD-10-CM | POA: Diagnosis not present

## 2020-06-11 DIAGNOSIS — N183 Chronic kidney disease, stage 3 unspecified: Secondary | ICD-10-CM | POA: Diagnosis not present

## 2020-06-11 DIAGNOSIS — E1122 Type 2 diabetes mellitus with diabetic chronic kidney disease: Secondary | ICD-10-CM | POA: Diagnosis not present

## 2020-06-11 DIAGNOSIS — U071 COVID-19: Secondary | ICD-10-CM | POA: Diagnosis not present

## 2020-06-11 DIAGNOSIS — J441 Chronic obstructive pulmonary disease with (acute) exacerbation: Secondary | ICD-10-CM | POA: Diagnosis not present

## 2020-06-11 DIAGNOSIS — I5032 Chronic diastolic (congestive) heart failure: Secondary | ICD-10-CM | POA: Diagnosis not present

## 2020-06-13 DIAGNOSIS — U071 COVID-19: Secondary | ICD-10-CM | POA: Diagnosis not present

## 2020-06-13 DIAGNOSIS — N183 Chronic kidney disease, stage 3 unspecified: Secondary | ICD-10-CM | POA: Diagnosis not present

## 2020-06-13 DIAGNOSIS — E1122 Type 2 diabetes mellitus with diabetic chronic kidney disease: Secondary | ICD-10-CM | POA: Diagnosis not present

## 2020-06-13 DIAGNOSIS — I5032 Chronic diastolic (congestive) heart failure: Secondary | ICD-10-CM | POA: Diagnosis not present

## 2020-06-13 DIAGNOSIS — I1310 Hypertensive heart and chronic kidney disease without heart failure, with stage 1 through stage 4 chronic kidney disease, or unspecified chronic kidney disease: Secondary | ICD-10-CM | POA: Diagnosis not present

## 2020-06-13 DIAGNOSIS — J441 Chronic obstructive pulmonary disease with (acute) exacerbation: Secondary | ICD-10-CM | POA: Diagnosis not present

## 2020-06-17 DIAGNOSIS — Z794 Long term (current) use of insulin: Secondary | ICD-10-CM | POA: Diagnosis not present

## 2020-06-17 DIAGNOSIS — R2681 Unsteadiness on feet: Secondary | ICD-10-CM | POA: Diagnosis not present

## 2020-06-17 DIAGNOSIS — I1310 Hypertensive heart and chronic kidney disease without heart failure, with stage 1 through stage 4 chronic kidney disease, or unspecified chronic kidney disease: Secondary | ICD-10-CM | POA: Diagnosis not present

## 2020-06-17 DIAGNOSIS — N183 Chronic kidney disease, stage 3 unspecified: Secondary | ICD-10-CM | POA: Diagnosis not present

## 2020-06-17 DIAGNOSIS — Z8616 Personal history of COVID-19: Secondary | ICD-10-CM | POA: Diagnosis not present

## 2020-06-17 DIAGNOSIS — M6281 Muscle weakness (generalized): Secondary | ICD-10-CM | POA: Diagnosis not present

## 2020-06-17 DIAGNOSIS — J441 Chronic obstructive pulmonary disease with (acute) exacerbation: Secondary | ICD-10-CM | POA: Diagnosis not present

## 2020-06-17 DIAGNOSIS — Z6841 Body Mass Index (BMI) 40.0 and over, adult: Secondary | ICD-10-CM | POA: Diagnosis not present

## 2020-06-17 DIAGNOSIS — E1122 Type 2 diabetes mellitus with diabetic chronic kidney disease: Secondary | ICD-10-CM | POA: Diagnosis not present

## 2020-06-17 DIAGNOSIS — I4821 Permanent atrial fibrillation: Secondary | ICD-10-CM | POA: Diagnosis not present

## 2020-06-17 DIAGNOSIS — I5032 Chronic diastolic (congestive) heart failure: Secondary | ICD-10-CM | POA: Diagnosis not present

## 2020-06-18 DIAGNOSIS — I5032 Chronic diastolic (congestive) heart failure: Secondary | ICD-10-CM | POA: Diagnosis not present

## 2020-06-18 DIAGNOSIS — Z8616 Personal history of COVID-19: Secondary | ICD-10-CM | POA: Diagnosis not present

## 2020-06-18 DIAGNOSIS — E1122 Type 2 diabetes mellitus with diabetic chronic kidney disease: Secondary | ICD-10-CM | POA: Diagnosis not present

## 2020-06-18 DIAGNOSIS — I1310 Hypertensive heart and chronic kidney disease without heart failure, with stage 1 through stage 4 chronic kidney disease, or unspecified chronic kidney disease: Secondary | ICD-10-CM | POA: Diagnosis not present

## 2020-06-18 DIAGNOSIS — M6281 Muscle weakness (generalized): Secondary | ICD-10-CM | POA: Diagnosis not present

## 2020-06-18 DIAGNOSIS — J441 Chronic obstructive pulmonary disease with (acute) exacerbation: Secondary | ICD-10-CM | POA: Diagnosis not present

## 2020-06-24 DIAGNOSIS — M6281 Muscle weakness (generalized): Secondary | ICD-10-CM | POA: Diagnosis not present

## 2020-06-24 DIAGNOSIS — E1122 Type 2 diabetes mellitus with diabetic chronic kidney disease: Secondary | ICD-10-CM | POA: Diagnosis not present

## 2020-06-24 DIAGNOSIS — I5032 Chronic diastolic (congestive) heart failure: Secondary | ICD-10-CM | POA: Diagnosis not present

## 2020-06-24 DIAGNOSIS — I1310 Hypertensive heart and chronic kidney disease without heart failure, with stage 1 through stage 4 chronic kidney disease, or unspecified chronic kidney disease: Secondary | ICD-10-CM | POA: Diagnosis not present

## 2020-06-24 DIAGNOSIS — Z8616 Personal history of COVID-19: Secondary | ICD-10-CM | POA: Diagnosis not present

## 2020-06-24 DIAGNOSIS — J441 Chronic obstructive pulmonary disease with (acute) exacerbation: Secondary | ICD-10-CM | POA: Diagnosis not present

## 2020-06-26 DIAGNOSIS — I1310 Hypertensive heart and chronic kidney disease without heart failure, with stage 1 through stage 4 chronic kidney disease, or unspecified chronic kidney disease: Secondary | ICD-10-CM | POA: Diagnosis not present

## 2020-06-26 DIAGNOSIS — Z8616 Personal history of COVID-19: Secondary | ICD-10-CM | POA: Diagnosis not present

## 2020-06-26 DIAGNOSIS — J441 Chronic obstructive pulmonary disease with (acute) exacerbation: Secondary | ICD-10-CM | POA: Diagnosis not present

## 2020-06-26 DIAGNOSIS — E1122 Type 2 diabetes mellitus with diabetic chronic kidney disease: Secondary | ICD-10-CM | POA: Diagnosis not present

## 2020-06-26 DIAGNOSIS — M6281 Muscle weakness (generalized): Secondary | ICD-10-CM | POA: Diagnosis not present

## 2020-06-26 DIAGNOSIS — I5032 Chronic diastolic (congestive) heart failure: Secondary | ICD-10-CM | POA: Diagnosis not present

## 2020-06-27 DIAGNOSIS — Z20828 Contact with and (suspected) exposure to other viral communicable diseases: Secondary | ICD-10-CM | POA: Diagnosis not present

## 2020-06-27 DIAGNOSIS — U071 COVID-19: Secondary | ICD-10-CM | POA: Diagnosis not present

## 2020-07-01 DIAGNOSIS — J441 Chronic obstructive pulmonary disease with (acute) exacerbation: Secondary | ICD-10-CM | POA: Diagnosis not present

## 2020-07-01 DIAGNOSIS — I5032 Chronic diastolic (congestive) heart failure: Secondary | ICD-10-CM | POA: Diagnosis not present

## 2020-07-01 DIAGNOSIS — I1310 Hypertensive heart and chronic kidney disease without heart failure, with stage 1 through stage 4 chronic kidney disease, or unspecified chronic kidney disease: Secondary | ICD-10-CM | POA: Diagnosis not present

## 2020-07-01 DIAGNOSIS — E1122 Type 2 diabetes mellitus with diabetic chronic kidney disease: Secondary | ICD-10-CM | POA: Diagnosis not present

## 2020-07-01 DIAGNOSIS — Z8616 Personal history of COVID-19: Secondary | ICD-10-CM | POA: Diagnosis not present

## 2020-07-01 DIAGNOSIS — M6281 Muscle weakness (generalized): Secondary | ICD-10-CM | POA: Diagnosis not present

## 2020-07-02 ENCOUNTER — Other Ambulatory Visit: Payer: Self-pay | Admitting: "Endocrinology

## 2020-07-02 DIAGNOSIS — M79675 Pain in left toe(s): Secondary | ICD-10-CM | POA: Diagnosis not present

## 2020-07-02 DIAGNOSIS — B351 Tinea unguium: Secondary | ICD-10-CM | POA: Diagnosis not present

## 2020-07-02 DIAGNOSIS — M79674 Pain in right toe(s): Secondary | ICD-10-CM | POA: Diagnosis not present

## 2020-07-02 DIAGNOSIS — Z20828 Contact with and (suspected) exposure to other viral communicable diseases: Secondary | ICD-10-CM | POA: Diagnosis not present

## 2020-07-02 DIAGNOSIS — U071 COVID-19: Secondary | ICD-10-CM | POA: Diagnosis not present

## 2020-07-03 ENCOUNTER — Encounter (HOSPITAL_COMMUNITY): Payer: Self-pay

## 2020-07-03 ENCOUNTER — Other Ambulatory Visit: Payer: Self-pay

## 2020-07-03 NOTE — Pre-Procedure Instructions (Signed)
Preop call made to [place] at [facility] to go over pre procedure information. They already have copy of diet and prep instructions. Went over history with Sunday Spillers who states that consent needs to come from [facility] at [place], [place], [place]. I verbally went over preprocedure instructions with Sunday Spillers and faxed a copy to her. I called Netta Cedars at the above number and left a message for her to call us back for consent for procedure.

## 2020-07-03 NOTE — Patient Instructions (Signed)
    KAHLANI GRABER  07/03/2020     @PREFPERIOPPHARMACY @   Your procedure is scheduled on  07/08/2020   Report to [facility] at  [email or web address].M.   Call this number if you have problems the morning of surgery:  [phone]   Remember:  Follow the diet and prep instructions given to you by the office.                      Take these medicines the morning of surgery with A SIP OF WATER  Diltiazem, isosorbide, metoprolol. Use your nebulizer and your inhaler before you come. Bring your rescue inhaler with you.  07/07/2020- take 20 units of Humulin R with lunch and supper if glucose is above 90.  DO NOT take any diabetic medications the morning of your procedure.  If your glucose is 70 or below the morning of your procedure, drink 1/2 cup of clear juice and recheck your glucose in 15 minutes. If your glucose is still 70 or below, call [phone] for instructions.  Please send copy of MAR with 07/08/2020 meds signed off so we can be sure of what was taken that morning. tructions.  If your glucose is 300 or above the morning of your procedure, call [phone] for instructions.                 Please brush your teeth.  Do not wear jewelry, make-up or nail polish.  Do not wear lotions, powders, or perfumes, or deodorant.  Do not shave 48 hours prior to surgery.  Men may shave face and neck.  Do not bring valuables to the hospital.  [facility] is not responsible for any belongings or valuables.  Contacts, dentures or bridgework may not be worn into surgery.  Leave your suitcase in the car.  After surgery it may be brought to your room.  For patients admitted to the hospital, discharge time will be determined by your treatment team.  Patients discharged the day of surgery will not be allowed to drive home and must have someone with them for 24 hours   Someone needs to stay with your until we receive COVID results prior to procedure.    Special instructions:  DO NOT smoke tobacco or  vape 24 hours before procedure.

## 2020-07-04 ENCOUNTER — Encounter (HOSPITAL_COMMUNITY)
Admission: RE | Admit: 2020-07-04 | Discharge: 2020-07-04 | Disposition: A | Discharge status: Home / Self Care | Payer: Medicare Other | Source: Ambulatory Visit | Attending: Internal Medicine | Admitting: Internal Medicine

## 2020-07-05 DIAGNOSIS — Z8616 Personal history of COVID-19: Secondary | ICD-10-CM | POA: Diagnosis not present

## 2020-07-05 DIAGNOSIS — I1310 Hypertensive heart and chronic kidney disease without heart failure, with stage 1 through stage 4 chronic kidney disease, or unspecified chronic kidney disease: Secondary | ICD-10-CM | POA: Diagnosis not present

## 2020-07-05 DIAGNOSIS — I5032 Chronic diastolic (congestive) heart failure: Secondary | ICD-10-CM | POA: Diagnosis not present

## 2020-07-05 DIAGNOSIS — E1122 Type 2 diabetes mellitus with diabetic chronic kidney disease: Secondary | ICD-10-CM | POA: Diagnosis not present

## 2020-07-05 DIAGNOSIS — J441 Chronic obstructive pulmonary disease with (acute) exacerbation: Secondary | ICD-10-CM | POA: Diagnosis not present

## 2020-07-05 DIAGNOSIS — M6281 Muscle weakness (generalized): Secondary | ICD-10-CM | POA: Diagnosis not present

## 2020-07-07 DIAGNOSIS — E1165 Type 2 diabetes mellitus with hyperglycemia: Secondary | ICD-10-CM | POA: Diagnosis not present

## 2020-07-07 DIAGNOSIS — I5032 Chronic diastolic (congestive) heart failure: Secondary | ICD-10-CM | POA: Diagnosis not present

## 2020-07-08 ENCOUNTER — Encounter (HOSPITAL_COMMUNITY)
Admission: RE | Disposition: A | Discharge status: Home / Self Care | Payer: Self-pay | Source: Home / Self Care | Attending: Internal Medicine

## 2020-07-08 ENCOUNTER — Encounter (HOSPITAL_COMMUNITY): Payer: Self-pay | Admitting: Internal Medicine

## 2020-07-08 ENCOUNTER — Ambulatory Visit (HOSPITAL_COMMUNITY): Payer: Medicare Other | Admitting: Anesthesiology

## 2020-07-08 ENCOUNTER — Other Ambulatory Visit (HOSPITAL_COMMUNITY)
Admission: RE | Admit: 2020-07-08 | Discharge: 2020-07-08 | Disposition: A | Discharge status: Home / Self Care | Payer: Medicare Other | Source: Ambulatory Visit | Attending: Internal Medicine | Admitting: Internal Medicine

## 2020-07-08 ENCOUNTER — Ambulatory Visit (HOSPITAL_COMMUNITY)
Admission: RE | Admit: 2020-07-08 | Discharge: 2020-07-08 | Disposition: A | Discharge status: Home / Self Care | Payer: Medicare Other | Attending: Internal Medicine | Admitting: Internal Medicine

## 2020-07-08 DIAGNOSIS — Z7951 Long term (current) use of inhaled steroids: Secondary | ICD-10-CM | POA: Insufficient documentation

## 2020-07-08 DIAGNOSIS — Z1211 Encounter for screening for malignant neoplasm of colon: Secondary | ICD-10-CM | POA: Diagnosis not present

## 2020-07-08 DIAGNOSIS — I130 Hypertensive heart and chronic kidney disease with heart failure and stage 1 through stage 4 chronic kidney disease, or unspecified chronic kidney disease: Secondary | ICD-10-CM | POA: Diagnosis not present

## 2020-07-08 DIAGNOSIS — Z841 Family history of disorders of kidney and ureter: Secondary | ICD-10-CM | POA: Insufficient documentation

## 2020-07-08 DIAGNOSIS — Z833 Family history of diabetes mellitus: Secondary | ICD-10-CM | POA: Insufficient documentation

## 2020-07-08 DIAGNOSIS — K641 Second degree hemorrhoids: Secondary | ICD-10-CM | POA: Diagnosis not present

## 2020-07-08 DIAGNOSIS — Z79899 Other long term (current) drug therapy: Secondary | ICD-10-CM | POA: Diagnosis not present

## 2020-07-08 DIAGNOSIS — Z20822 Contact with and (suspected) exposure to covid-19: Secondary | ICD-10-CM | POA: Insufficient documentation

## 2020-07-08 DIAGNOSIS — Z87891 Personal history of nicotine dependence: Secondary | ICD-10-CM | POA: Diagnosis not present

## 2020-07-08 DIAGNOSIS — Z794 Long term (current) use of insulin: Secondary | ICD-10-CM | POA: Insufficient documentation

## 2020-07-08 DIAGNOSIS — I4891 Unspecified atrial fibrillation: Secondary | ICD-10-CM | POA: Insufficient documentation

## 2020-07-08 DIAGNOSIS — K635 Polyp of colon: Secondary | ICD-10-CM

## 2020-07-08 DIAGNOSIS — Z7901 Long term (current) use of anticoagulants: Secondary | ICD-10-CM | POA: Insufficient documentation

## 2020-07-08 DIAGNOSIS — D122 Benign neoplasm of ascending colon: Secondary | ICD-10-CM | POA: Diagnosis not present

## 2020-07-08 DIAGNOSIS — D128 Benign neoplasm of rectum: Secondary | ICD-10-CM | POA: Diagnosis not present

## 2020-07-08 DIAGNOSIS — N183 Chronic kidney disease, stage 3 unspecified: Secondary | ICD-10-CM | POA: Diagnosis not present

## 2020-07-08 DIAGNOSIS — E1122 Type 2 diabetes mellitus with diabetic chronic kidney disease: Secondary | ICD-10-CM | POA: Insufficient documentation

## 2020-07-08 DIAGNOSIS — K621 Rectal polyp: Secondary | ICD-10-CM | POA: Diagnosis not present

## 2020-07-08 DIAGNOSIS — Z8249 Family history of ischemic heart disease and other diseases of the circulatory system: Secondary | ICD-10-CM | POA: Diagnosis not present

## 2020-07-08 DIAGNOSIS — Z86010 Personal history of colonic polyps: Secondary | ICD-10-CM

## 2020-07-08 DIAGNOSIS — I5032 Chronic diastolic (congestive) heart failure: Secondary | ICD-10-CM | POA: Diagnosis not present

## 2020-07-08 DIAGNOSIS — Z01812 Encounter for preprocedural laboratory examination: Secondary | ICD-10-CM | POA: Insufficient documentation

## 2020-07-08 DIAGNOSIS — I2510 Atherosclerotic heart disease of native coronary artery without angina pectoris: Secondary | ICD-10-CM | POA: Diagnosis not present

## 2020-07-08 HISTORY — PX: COLONOSCOPY WITH PROPOFOL: SHX5780

## 2020-07-08 HISTORY — PX: POLYPECTOMY: SHX149

## 2020-07-08 LAB — GLUCOSE, CAPILLARY: Glucose-Capillary: 189 mg/dL — ABNORMAL HIGH (ref 70–99)

## 2020-07-08 LAB — SARS CORONAVIRUS 2 BY RT PCR (HOSPITAL ORDER, PERFORMED IN ~~LOC~~ HOSPITAL LAB): SARS Coronavirus 2: NEGATIVE

## 2020-07-08 SURGERY — COLONOSCOPY WITH PROPOFOL
Anesthesia: General

## 2020-07-08 MED ORDER — LACTATED RINGERS IV SOLN: INTRAVENOUS | Status: DC

## 2020-07-08 MED ORDER — PROPOFOL 10 MG/ML IV BOLUS
INTRAVENOUS | Status: DC | PRN
  Administered 2020-07-08: 20 mg via INTRAVENOUS
  Administered 2020-07-08: 50 mg via INTRAVENOUS
  Administered 2020-07-08 (×2): 30 mg via INTRAVENOUS
  Administered 2020-07-08 (×2): 20 mg via INTRAVENOUS

## 2020-07-08 MED ORDER — PROPOFOL 500 MG/50ML IV EMUL
INTRAVENOUS | Status: DC | PRN
  Administered 2020-07-08: 150 ug/kg/min via INTRAVENOUS

## 2020-07-08 MED ORDER — PROPOFOL 10 MG/ML IV BOLUS
INTRAVENOUS | Status: AC
  Filled 2020-07-08: qty 60

## 2020-07-08 MED ORDER — STERILE WATER FOR IRRIGATION IR SOLN
Status: DC | PRN
  Administered 2020-07-08: 200 mL

## 2020-07-08 NOTE — Discharge Instructions (Signed)
Colonoscopy Discharge Instructions  Read the instructions outlined below and refer to this sheet in the next few weeks. These discharge instructions provide you with general information on caring for yourself after you leave the hospital. Your doctor may also give you specific instructions. While your treatment has been planned according to the most current medical practices available, unavoidable complications occasionally occur. If you have any problems or questions after discharge, call Dr. [name] at [phone]. ACTIVITY  You may resume your regular activity, but move at a slower pace for the next 24 hours.   Take frequent rest periods for the next 24 hours.   Walking will help get rid of the air and reduce the bloated feeling in your belly (abdomen).   No driving for 24 hours (because of the medicine (anesthesia) used during the test).    Do not sign any important legal documents or operate any machinery for 24 hours (because of the anesthesia used during the test).  NUTRITION  Drink plenty of fluids.   You may resume your normal diet as instructed by your doctor.   Begin with a light meal and progress to your normal diet. Heavy or fried foods are harder to digest and may make you feel sick to your stomach (nauseated).   Avoid alcoholic beverages for 24 hours or as instructed.  MEDICATIONS  You may resume your normal medications unless your doctor tells you otherwise.  WHAT YOU CAN EXPECT TODAY  Some feelings of bloating in the abdomen.   Passage of more gas than usual.   Spotting of blood in your stool or on the toilet paper.  IF YOU HAD POLYPS REMOVED DURING THE COLONOSCOPY:  No aspirin products for 7 days or as instructed.   No alcohol for 7 days or as instructed.   Eat a soft diet for the next 24 hours.  FINDING OUT THE RESULTS OF YOUR TEST Not all test results are available during your visit. If your test results are not back during the visit, make an appointment  with your caregiver to find out the results. Do not assume everything is normal if you have not heard from your caregiver or the medical facility. It is important for you to follow up on all of your test results.  SEEK IMMEDIATE MEDICAL ATTENTION IF:  You have more than a spotting of blood in your stool.   Your belly is swollen (abdominal distention).   You are nauseated or vomiting.   You have a temperature over 101.   You have abdominal pain or discomfort that is severe or gets worse throughout the day.    Multiple polyps removed in your colon today  Further recommendations to follow pending review of pathology report  You may pass a small amount of blood with your next bowel movement but it should go away  Resume Eliquis on 07/10/2020     Monitored Anesthesia Care, Care After This sheet gives you information about how to care for yourself after your procedure. Your health care provider may also give you more specific instructions. If you have problems or questions, contact your health care provider. What can I expect after the procedure? After the procedure, it is common to have:  Tiredness.  Forgetfulness about what happened after the procedure.  Impaired judgment for important decisions.  Nausea or vomiting.  Some difficulty with balance. Follow these instructions at home: For the time period you were told by your health care provider:  Rest as needed.  Do not  participate in activities where you could fall or become injured.  Do not drive or use machinery.  Do not drink alcohol.  Do not take sleeping pills or medicines that cause drowsiness.  Do not make important decisions or sign legal documents.  Do not take care of children on your own.      Eating and drinking  Follow the diet that is recommended by your health care provider.  Drink enough fluid to keep your urine pale yellow.  If you vomit: ? Drink water, juice, or soup when you can drink  without vomiting. ? Make sure you have little or no nausea before eating solid foods. General instructions  Have a responsible adult stay with you for the time you are told. It is important to have someone help care for you until you are awake and alert.  Take over-the-counter and prescription medicines only as told by your health care provider.  If you have sleep apnea, surgery and certain medicines can increase your risk for breathing problems. Follow instructions from your health care provider about wearing your sleep device: ? Anytime you are sleeping, including during daytime naps. ? While taking prescription pain medicines, sleeping medicines, or medicines that make you drowsy.  Avoid smoking.  Keep all follow-up visits as told by your health care provider. This is important. Contact a health care provider if:  You keep feeling nauseous or you keep vomiting.  You feel light-headed.  You are still sleepy or having trouble with balance after 24 hours.  You develop a rash.  You have a fever.  You have redness or swelling around the IV site. Get help right away if:  You have trouble breathing.  You have new-onset confusion at home. Summary  For several hours after your procedure, you may feel tired. You may also be forgetful and have poor judgment.  Have a responsible adult stay with you for the time you are told. It is important to have someone help care for you until you are awake and alert.  Rest as told. Do not drive or operate machinery. Do not drink alcohol or take sleeping pills.  Get help right away if you have trouble breathing, or if you suddenly become confused. This information is not intended to replace advice given to you by your health care provider. Make sure you discuss any questions you have with your health care provider. Document Revised: 11/30/2019 Document Reviewed: 02/16/2019 Elsevier Patient Education  2021 [place].     Colon  Polyps  Colon polyps are tissue growths inside the colon, which is part of the large intestine. They are one of the types of polyps that can grow in the body. A polyp may be a round bump or a mushroom-shaped growth. You could have one polyp or more than one. Most colon polyps are noncancerous (benign). However, some colon polyps can become cancerous over time. Finding and removing the polyps early can help prevent this. What are the causes? The exact cause of colon polyps is not known. What increases the risk? The following factors may make you more likely to develop this condition:  Having a family history of colorectal cancer or colon polyps.  Being older than 65 years of age.  Being younger than 65 years of age and having a significant family history of colorectal cancer or colon polyps or a genetic condition that puts you at higher risk of getting colon polyps.  Having inflammatory bowel disease, such as ulcerative colitis or Crohn's  disease.  Having certain conditions passed from parent to child (hereditary conditions), such as: ? Familial adenomatous polyposis (FAP). ? Lynch syndrome. ? Turcot syndrome. ? Peutz-Jeghers syndrome. ? MUTYH-associated polyposis (MAP).  Being overweight.  Certain lifestyle factors. These include smoking cigarettes, drinking too much alcohol, not getting enough exercise, and eating a diet that is high in fat and red meat and low in fiber.  Having had childhood cancer that was treated with radiation of the abdomen. What are the signs or symptoms? Many times, there are no symptoms. If you have symptoms, they may include:  Blood coming from the rectum during a bowel movement.  Blood in the stool (feces). The blood may be bright red or very dark in color.  Pain in the abdomen.  A change in bowel habits, such as constipation or diarrhea. How is this diagnosed? This condition is diagnosed with a colonoscopy. This is a procedure in which a lighted,  flexible scope is inserted into the opening between the buttocks (anus) and then passed into the colon to examine the area. Polyps are sometimes found when a colonoscopy is done as part of routine cancer screening tests. How is this treated? This condition is treated by removing any polyps that are found. Most polyps can be removed during a colonoscopy. Those polyps will then be tested for cancer. Additional treatment may be needed depending on the results of testing. Follow these instructions at home: Eating and drinking  Eat foods that are high in fiber, such as fruits, vegetables, and whole grains.  Eat foods that are high in calcium and vitamin D, such as milk, cheese, yogurt, eggs, liver, fish, and broccoli.  Limit foods that are high in fat, such as fried foods and desserts.  Limit the amount of red meat, precooked or cured meat, or other processed meat that you eat, such as hot dogs, sausages, bacon, or meat loaves.  Limit sugary drinks.   Lifestyle  Maintain a healthy weight, or lose weight if recommended by your health care provider.  Exercise every day or as told by your health care provider.  Do not use any products that contain nicotine or tobacco, such as cigarettes, e-cigarettes, and chewing tobacco. If you need help quitting, ask your health care provider.  Do not drink alcohol if: ? Your health care provider tells you not to drink. ? You are pregnant, may be pregnant, or are planning to become pregnant.  If you drink alcohol: ? Limit how much you use to:  0-1 drink a day for women.  0-2 drinks a day for men. ? Know how much alcohol is in your drink. In the [place] one drink equals one 12 oz bottle of beer (355 mL), one 5 oz glass of wine (148 mL), or one 1 oz glass of hard liquor (44 mL). General instructions  Take over-the-counter and prescription medicines only as told by your health care provider.  Keep all follow-up visits. This is important. This includes  having regularly scheduled colonoscopies. Talk to your health care provider about when you need a colonoscopy. Contact a health care provider if:  You have new or worsening bleeding during a bowel movement.  You have new or increased blood in your stool.  You have a change in bowel habits.  You lose weight for no known reason. Summary  Colon polyps are tissue growths inside the colon, which is part of the large intestine. They are one type of polyp that can grow in the body.  Most colon polyps are noncancerous (benign), but some can become cancerous over time.  This condition is diagnosed with a colonoscopy.  This condition is treated by removing any polyps that are found. Most polyps can be removed during a colonoscopy. This information is not intended to replace advice given to you by your health care provider. Make sure you discuss any questions you have with your health care provider. Document Revised: 07/05/2019 Document Reviewed: 07/05/2019 Elsevier Patient Education  2021 [employer].

## 2020-07-08 NOTE — Anesthesia Postprocedure Evaluation (Signed)
Anesthesia Post Note  Patient: [name]  Procedure(s) Performed: COLONOSCOPY WITH PROPOFOL (N/A ) POLYPECTOMY INTESTINAL  Patient location during evaluation: Short Stay Anesthesia Type: General Level of consciousness: awake and alert Pain management: pain level controlled Vital Signs Assessment: post-procedure vital signs reviewed and stable Respiratory status: spontaneous breathing Cardiovascular status: stable and blood pressure returned to baseline Postop Assessment: no apparent nausea or vomiting Anesthetic complications: no   No complications documented.   Last Vitals:  Vitals:   07/08/20 1020  BP: (!) 156/89  Pulse: 96  Resp: 18  Temp: (!) 36.4 C  SpO2: 100%    Last Pain:  Vitals:   07/08/20 1115  TempSrc:   PainSc: 0-No pain                 Desirae Mancusi

## 2020-07-08 NOTE — Anesthesia Preprocedure Evaluation (Addendum)
Anesthesia Evaluation  Patient identified by MRN, date of birth, ID band Patient awake    Reviewed: Allergy & Precautions, NPO status , Patient's Chart, lab work & pertinent test results, reviewed documented beta blocker date and time   Airway Mallampati: II  TM Distance: >3 FB Neck ROM: Full    Dental  (+) Dental Advisory Given, Missing   Pulmonary asthma , sleep apnea , pneumonia, former smoker,    Pulmonary exam normal breath sounds clear to auscultation       Cardiovascular Exercise Tolerance: Good hypertension, Pt. on medications and Pt. on home beta blockers + CAD and +CHF  + dysrhythmias Atrial Fibrillation  Rhythm:Irregular Rate:Abnormal - Systolic murmurs, - Diastolic murmurs, - Friction Rub, - Carotid Bruit, - Peripheral Edema and - Systolic Click . Calcified, heterogeneous, primarily immobile lesion on the left coronary cusp, appears to be primarily on the ventricular aspect of the left coronary cusp. In clip 33 where it is well seen it measures 0.98 cm x  0.56 cm. The lesion has the appearance of a subacute or healed vegetation. Differential also includes calcified  nodule in the setting of chronic dialysis.  2. The left ventricle has normal systolic function, with an ejection fraction of 60-65%. The cavity size was normal.  3. The right ventricle has normal systolc function. The cavity was normal. There is no increase in right ventricular wall thickness.  4. The aortic valve is tricuspid. Aortic valve regurgitation is trivial by color flow Doppler.  5. The tricuspid valve was normal in structure.  6. The pulmonic valve was normal in structure.  7. There is evidence of severe atherosclerotic plaque in the aortic arch and descending aorta.  8. Tiny patent foramen ovale with left to right shunting across the atrial septum by color flow Doppler.  9. The interatrial septum appears to be lipomatous.  10. No left atrial  appendage thrombus   Neuro/Psych PSYCHIATRIC DISORDERS    GI/Hepatic negative GI ROS, Neg liver ROS,   Endo/Other  diabetes, Well Controlled, Type 2, Oral Hypoglycemic Agents, Insulin Dependent  Renal/GU Renal InsufficiencyRenal disease (AKI)     Musculoskeletal  (+) Arthritis ,   Abdominal   Peds  Hematology  (+) anemia ,   Anesthesia Other Findings   Reproductive/Obstetrics                            Anesthesia Physical Anesthesia Plan  ASA: III  Anesthesia Plan: General   Post-op Pain Management:    Induction: Intravenous  PONV Risk Score and Plan: Propofol infusion  Airway Management Planned: Nasal Cannula and Natural Airway  Additional Equipment:   Intra-op Plan:   Post-operative Plan:   Informed Consent: I have reviewed the patients History and Physical, chart, labs and discussed the procedure including the risks, benefits and alternatives for the proposed anesthesia with the patient or authorized representative who has indicated his/her understanding and acceptance.     Dental advisory given  Plan Discussed with: CRNA and Surgeon  Anesthesia Plan Comments:         Anesthesia Quick Evaluation

## 2020-07-08 NOTE — Op Note (Signed)
[facility] Patient Name: [name] Procedure Date: 07/08/2020 10:54 AM MRN: [id number] Date of Birth: 01/29/1956 Attending MD: Norvel Richards , MD CSN: [id number] Age: 65 Admit Type: Outpatient Procedure:                Colonoscopy Indications:              High risk colon cancer surveillance: Personal                            history of colonic polyps Providers:                Norvel Richards, MD, Crystal Page, Aram Candela Referring MD:              Medicines:                Propofol per Anesthesia Complications:            No immediate complications. Estimated Blood Loss:     Estimated blood loss was minimal. Procedure:                Pre-Anesthesia Assessment:                           - Prior to the procedure, a History and Physical                            was performed, and patient medications and                            allergies were reviewed. The patient's tolerance of                            previous anesthesia was also reviewed. The risks                            and benefits of the procedure and the sedation                            options and risks were discussed with the patient.                            All questions were answered, and informed consent                            was obtained. Prior Anticoagulants: The patient                            last took Eliquis (apixaban) 3 days prior to the                            procedure. ASA Grade Assessment: III - A patient                            with severe systemic disease. After reviewing the  risks and benefits, the patient was deemed in                            satisfactory condition to undergo the procedure.                           After obtaining informed consent, the colonoscope                            was passed under direct vision. Throughout the                            procedure, the patient's blood pressure, pulse, and                             oxygen saturations were monitored continuously. The                            [id number]-[id number] ([id number]) scope was introduced through                            the anus and advanced to the the cecum, identified                            by appendiceal orifice and ileocecal valve. The                            colonoscopy was performed without difficulty. The                            patient tolerated the procedure well. The quality                            of the bowel preparation was adequate. Scope In: 11:22:11 AM Scope Out: 11:41:17 AM Scope Withdrawal Time: 0 hours 12 minutes 28 seconds  Total Procedure Duration: 0 hours 19 minutes 6 seconds  Findings:      The perianal and digital rectal examinations were normal.      Seven semi-pedunculated polyps were found in the rectum and ascending       colon. The polyps were 5 to 9 mm in size. These polyps were removed with       a cold snare. Resection and retrieval were complete. Estimated blood       loss was minimal.      Non-bleeding internal hemorrhoids were found during retroflexion. The       hemorrhoids were moderate, medium-sized and Grade II (internal       hemorrhoids that prolapse but reduce spontaneously).      The exam was otherwise without abnormality on direct and retroflexion       views. Impression:               - Seven 5 to 9 mm polyps in the rectum (1) and in                            the ascending colon(6),  removed with a cold snare.                            Resected and retrieved.                           - Non-bleeding internal hemorrhoids.                           - The examination was otherwise normal on direct                            and retroflexion views. Moderate Sedation:      Moderate (conscious) sedation was personally administered by an       anesthesia professional. The following parameters were monitored: oxygen       saturation, heart rate, blood pressure, respiratory rate,  EKG, adequacy       of pulmonary ventilation, and response to care. Recommendation:           - Patient has a contact number available for                            emergencies. The signs and symptoms of potential                            delayed complications were discussed with the                            patient. Return to normal activities tomorrow.                            Written discharge instructions were provided to the                            patient.                           - Resume previous diet.                           - Continue present medications.                           - Repeat colonoscopy date to be determined after                            pending pathology results are reviewed for                            surveillance.                           - Return to GI office (date not yet determined).                            Resume Eliquis 07/10/2020 Procedure Code(s):        --- Professional ---  45385, Colonoscopy, flexible; with removal of                            tumor(s), polyp(s), or other lesion(s) by snare                            technique Diagnosis Code(s):        --- Professional ---                           Z86.010, Personal history of colonic polyps                           K62.1, Rectal polyp                           K63.5, Polyp of colon                           K64.1, Second degree hemorrhoids CPT copyright 2019 [employer]. All rights reserved. The codes documented in this report are preliminary and upon coder review may  be revised to meet current compliance requirements. Cristopher Estimable. Amara Manalang, MD Norvel Richards, MD 07/08/2020 11:48:35 AM This report has been signed electronically. Number of Addenda: 0

## 2020-07-08 NOTE — H&P (Addendum)
@LOGO @   Primary Care Physician:  Rosita Fire, MD Primary Gastroenterologist:  Dr. [name]  Pre-Procedure History & Physical: HPI:  [name] is a 65 y.o. female here for updated colonoscopy.  History of adenoma removed from her colon in 2011 or 2012.;  Overdue for follow-up.  No bowel symptoms currently. Difficult airway in the past noted  Past Medical History:  Diagnosis Date  . Arthritis   . Asthma   . Atrial fibrillation ([place])   . Carcinoid tumor determined by biopsy of lung 05/2018   left lung  . Chronic diastolic CHF (congestive heart failure) ([place])   . CKD (chronic kidney disease), stage III ([place])   . Diabetes mellitus type 2 in obese ([place])   . Hypertension   . Iron deficiency anemia 10/16/2010  . Mental handicap 10/16/2010  . Mild CAD 2013  . Morbid obesity ([place])   . OSA (obstructive sleep apnea)     Past Surgical History:  Procedure Laterality Date  . ABDOMINAL HYSTERECTOMY    . AV FISTULA PLACEMENT Right 06/06/2018   Procedure: ARTERIOVENOUS (AV) FISTULA CREATION;  Surgeon: Marty Heck, MD;  Location: [facility];  Service: Vascular;  Laterality: Right;  . CESAREAN SECTION    . CHOLECYSTECTOMY    . COLONOSCOPY  08/2010   normal TI, sigmoid polyp (adenoma ). Next TCS due  08/2015,  . ESOPHAGOGASTRODUODENOSCOPY  08/2010   antral and duodenal erosions s/p bx (chronic gastritis, no h.pylori, no celiac dz ), hiatal hernia  . FISTULA SUPERFICIALIZATION Right 08/25/2018   Procedure: FISTULA SUPERFICIALIZATION RIGHT ARM;  Surgeon: Waynetta Sandy, MD;  Location: [facility];  Service: Cardiovascular;  Laterality: Right;  . IR FLUORO GUIDE CV LINE RIGHT  05/31/2018  . IR US GUIDE VASC ACCESS RIGHT  05/31/2018  . KNEE SURGERY     right knee @ 65 years of age  . LEFT AND RIGHT HEART CATHETERIZATION WITH CORONARY ANGIOGRAM N/A 09/21/2011   Procedure: LEFT AND RIGHT HEART CATHETERIZATION WITH CORONARY ANGIOGRAM;  Surgeon: Birdie Riddle, MD;  Location: [facility];   Service: Cardiovascular;  Laterality: N/A;    Prior to Admission medications   Medication Sig Start Date End Date Taking? Authorizing Provider  acetaminophen (TYLENOL) 325 MG tablet Take 325 mg by mouth every 12 (twelve) hours as needed for moderate pain.   Yes [provider]  albuterol (PROVENTIL HFA;VENTOLIN HFA) 108 (90 Base) MCG/ACT inhaler Inhale 2 puffs into the lungs every 6 (six) hours as needed for shortness of breath.   Yes [provider]  apixaban (ELIQUIS) 5 MG TABS tablet Take 1 tablet (5 mg total) by mouth 2 (two) times daily. Patient taking differently: Take 5 mg by mouth 2 (two) times daily. (0800 & 2000) 06/09/18  Yes Dessa Phi, DO  betamethasone dipropionate (DIPROLENE) 0.05 % ointment Apply 1 application topically daily as needed (for rash under breasts).  08/04/19  Yes [provider]  cetirizine (ZYRTEC) 10 MG tablet Take 10 mg by mouth daily. (0800) 06/06/19  Yes [provider]  clotrimazole (LOTRIMIN) 1 % cream Apply 1 application topically daily as needed (under breasts as needed for rash).   Yes [provider]  DIABETIC TUSSIN EX 100 MG/5ML syrup Take 100 mg by mouth every 4 (four) hours as needed for cough or congestion.  06/09/19  Yes [provider]  diltiazem (CARDIZEM CD) 240 MG 24 hr capsule Take 1 capsule (240 mg total) by mouth daily. Patient taking differently: Take 240 mg  by mouth daily. (0800) 06/10/18  Yes Dessa Phi, DO  fluticasone [facility]) 50 MCG/ACT nasal spray Place 2 sprays into both nostrils daily as needed for allergies or rhinitis.   Yes [provider]  Fluticasone-Umeclidin-Vilant 100-62.5-25 MCG/INH AEPB Inhale 1 puff into the lungs daily. (0800)   Yes [provider]  furosemide (LASIX) 40 MG tablet Take 40 mg by mouth 2 (two) times daily. 04/20/19  Yes [provider]  guaiFENesin (MUCINEX) 600 MG 12 hr tablet Take 1 tablet (600 mg total) by mouth 2 (two) times  daily as needed. Patient taking differently: Take 600 mg by mouth 2 (two) times daily as needed (cough/congestion). 02/20/20  Yes Strader, [place] M, PA-C  hydrALAZINE (APRESOLINE) 25 MG tablet Take 1 tablet (25 mg total) by mouth every 8 (eight) hours for 30 days. Patient taking differently: Take 25 mg by mouth 3 (three) times daily. (0800, 1400 & 2000) 06/09/18 02/20/20 Yes Dessa Phi, DO  insulin regular human CONCENTRATED (HUMULIN R U-500 KWIKPEN) 500 UNIT/ML kwikpen INJECT 40UNITS SUBCUTANEOUSLY WITH BREAKFAST, 30 UNITS WITH LUNCH, & 30 UNITS WITH SUPPER WHEN BLOOD SUGAR IS > 90MG /DL AND PATIENT IS EATING. Patient taking differently: Inject 30-40 Units into the skin See admin instructions. INJECT 40UNITS SUBCUTANEOUSLY WITH BREAKFAST, 30 UNITS WITH LUNCH, & 30 UNITS WITH SUPPER WHEN BLOOD SUGAR IS > 90MG /DL AND PATIENT IS EATING. 06/05/20  Yes Nida, Marella Chimes, MD  ipratropium-albuterol (DUONEB) 0.5-2.5 (3) MG/3ML SOLN Take 3 mLs by nebulization every 6 (six) hours as needed (for cough).    Yes [provider]  isosorbide dinitrate (ISORDIL) 10 MG tablet Take 1 tablet (10 mg total) by mouth 3 (three) times daily. Patient taking differently: Take 10 mg by mouth 3 (three) times daily. (0800, 1400 & 2000) 06/09/18  Yes Dessa Phi, DO  metolazone (ZAROXOLYN) 5 MG tablet Take 5 mg by mouth in the morning. (0800)   Yes [provider]  metoprolol tartrate (LOPRESSOR) 50 MG tablet Take 50 mg by mouth 2 (two) times daily. (0800 & 2000) 05/08/20  Yes [provider]  montelukast (SINGULAIR) 10 MG tablet Take 10 mg by mouth at bedtime. (2000)   Yes [provider]  polyethylene glycol powder (GLYCOLAX/MIRALAX) 17 GM/SCOOP powder Take 17 g by mouth daily as needed for mild constipation or moderate constipation.   Yes [provider]  potassium chloride SA (KLOR-CON) 20 MEQ tablet Take 20 mEq by mouth 3 (three) times daily. (0800, 1400 & 2000) 09/11/19  Yes  [provider]  rosuvastatin (CRESTOR) 20 MG tablet TAKE (1) TABLET BY MOUTH ONCE DAILY. Patient taking differently: Take 20 mg by mouth daily at 8 pm. (2000) 12/20/19  Yes Nida, Marella Chimes, MD  triamcinolone cream (KENALOG) 0.5 % Apply 1 application topically every 12 (twelve) hours as needed (applied to bilateral legs for allergic dermatitis).    Yes [provider]  Vitamin D, Cholecalciferol, 25 MCG (1000 UT) TABS Take 1,000 Units by mouth daily. (0800) 06/06/19  Yes [provider]  glucose blood (EASYMAX TEST) test strip USE TO CHECK BLOOD SUGAR 4 TIMES DAILY. 06/05/20   Nida, Marella Chimes, MD  Lancets 28G MISC USE TO CHECK BLOOD SUGAR 4 TIMES DAILY. DX E11.22 07/02/20   Cassandria Anger, MD    Allergies as of 05/14/2020  . (No Known Allergies)    Family History  Problem Relation Age of Onset  . Diabetes Mother   . Hypertension Mother   . Heart failure Mother   .  Hypertension Father   . Atrial fibrillation Father   . Kidney disease Other        son reportedly has had cyst on kidney and lung s/p surgery at [facility]  . Hypertension Son   . Colon cancer Neg Hx     Social History   Socioeconomic History  . Marital status: Divorced    Spouse name: Not on file  . Number of children: 1  . Years of education: Not on file  . Highest education level: Not on file  Occupational History    Employer: UNEMPLOYED  Tobacco Use  . Smoking status: Former Smoker    Packs/day: 1.00    Years: 0.00    Pack years: 0.00    Types: Cigarettes    Quit date: 04/11/1996    Years since quitting: 24.2  . Smokeless tobacco: Never Used  . Tobacco comment: quit a couple year ago  Vaping Use  . Vaping Use: Never used  Substance and Sexual Activity  . Alcohol use: No  . Drug use: No  . Sexual activity: Never  Other Topics Concern  . Not on file  Social History Narrative   Lives with parents.    Social Determinants of Health   Financial Resource Strain: Not  on file  Food Insecurity: Not on file  Transportation Needs: Not on file  Physical Activity: Not on file  Stress: Not on file  Social Connections: Not on file  Intimate Partner Violence: Not on file    Review of Systems: See HPI, otherwise negative ROS  Physical Exam: There were no vitals taken for this visit. General:   Alert,  Well-developed, well-nourished, pleasant and cooperative in NAD l adenopathy. Lungs:  Clear throughout to auscultation.   No wheezes, crackles, or rhonchi. No acute distress. Heart:  Regular rate and rhythm; no murmurs, clicks, rubs,  or gallops. Abdomen: Non-distended, normal bowel sounds.  Soft and nontender without appreciable mass or hepatosplenomegaly.  Pulses:  Normal pulses noted. Extremities:  Without clubbing or edema.  Impression/Plan: 65 year old lady with a distant history colonic adenoma here for surveillance colonoscopy. I have offered the patient a surveillance colonoscopy today per plan. The risks, benefits, limitations, alternatives and imponderables have been reviewed with the patient. Questions have been answered. All parties are agreeable.  Discussed difficult airway history with anesthesia staff.    Notice: This dictation was prepared with Dragon dictation along with smaller phrase technology. Any transcriptional errors that result from this process are unintentional and may not be corrected upon review.

## 2020-07-08 NOTE — Transfer of Care (Signed)
Immediate Anesthesia Transfer of Care Note  Patient: [name]  Procedure(s) Performed: COLONOSCOPY WITH PROPOFOL (N/A ) POLYPECTOMY INTESTINAL  Patient Location: Short Stay  Anesthesia Type:General  Level of Consciousness: awake  Airway & Oxygen Therapy: Patient Spontanous Breathing  Post-op Assessment: Report given to RN  Post vital signs: Reviewed and stable  Last Vitals:  Vitals Value Taken Time  BP    Temp    Pulse    Resp    SpO2      Last Pain:  Vitals:   07/08/20 1115  TempSrc:   PainSc: 0-No pain      Patients Stated Pain Goal: 5 (74/93/55 2174)  Complications: No complications documented.

## 2020-07-09 DIAGNOSIS — Z20828 Contact with and (suspected) exposure to other viral communicable diseases: Secondary | ICD-10-CM | POA: Diagnosis not present

## 2020-07-09 DIAGNOSIS — U071 COVID-19: Secondary | ICD-10-CM | POA: Diagnosis not present

## 2020-07-09 LAB — SURGICAL PATHOLOGY

## 2020-07-10 ENCOUNTER — Encounter: Payer: Self-pay | Admitting: Internal Medicine

## 2020-07-10 DIAGNOSIS — I1310 Hypertensive heart and chronic kidney disease without heart failure, with stage 1 through stage 4 chronic kidney disease, or unspecified chronic kidney disease: Secondary | ICD-10-CM | POA: Diagnosis not present

## 2020-07-10 DIAGNOSIS — M6281 Muscle weakness (generalized): Secondary | ICD-10-CM | POA: Diagnosis not present

## 2020-07-10 DIAGNOSIS — E1122 Type 2 diabetes mellitus with diabetic chronic kidney disease: Secondary | ICD-10-CM | POA: Diagnosis not present

## 2020-07-10 DIAGNOSIS — J441 Chronic obstructive pulmonary disease with (acute) exacerbation: Secondary | ICD-10-CM | POA: Diagnosis not present

## 2020-07-10 DIAGNOSIS — Z8616 Personal history of COVID-19: Secondary | ICD-10-CM | POA: Diagnosis not present

## 2020-07-10 DIAGNOSIS — I5032 Chronic diastolic (congestive) heart failure: Secondary | ICD-10-CM | POA: Diagnosis not present

## 2020-07-11 ENCOUNTER — Encounter (HOSPITAL_COMMUNITY): Payer: Self-pay | Admitting: Internal Medicine

## 2020-07-11 DIAGNOSIS — J441 Chronic obstructive pulmonary disease with (acute) exacerbation: Secondary | ICD-10-CM | POA: Diagnosis not present

## 2020-07-11 DIAGNOSIS — I1310 Hypertensive heart and chronic kidney disease without heart failure, with stage 1 through stage 4 chronic kidney disease, or unspecified chronic kidney disease: Secondary | ICD-10-CM | POA: Diagnosis not present

## 2020-07-11 DIAGNOSIS — M6281 Muscle weakness (generalized): Secondary | ICD-10-CM | POA: Diagnosis not present

## 2020-07-11 DIAGNOSIS — E1122 Type 2 diabetes mellitus with diabetic chronic kidney disease: Secondary | ICD-10-CM | POA: Diagnosis not present

## 2020-07-11 DIAGNOSIS — Z8616 Personal history of COVID-19: Secondary | ICD-10-CM | POA: Diagnosis not present

## 2020-07-11 DIAGNOSIS — I5032 Chronic diastolic (congestive) heart failure: Secondary | ICD-10-CM | POA: Diagnosis not present

## 2020-07-16 DIAGNOSIS — U071 COVID-19: Secondary | ICD-10-CM | POA: Diagnosis not present

## 2020-07-16 DIAGNOSIS — Z20828 Contact with and (suspected) exposure to other viral communicable diseases: Secondary | ICD-10-CM | POA: Diagnosis not present

## 2020-07-17 ENCOUNTER — Other Ambulatory Visit: Payer: Self-pay | Admitting: "Endocrinology

## 2020-07-23 ENCOUNTER — Other Ambulatory Visit: Payer: Self-pay

## 2020-07-23 ENCOUNTER — Encounter (HOSPITAL_COMMUNITY): Payer: Self-pay

## 2020-07-23 ENCOUNTER — Ambulatory Visit (HOSPITAL_COMMUNITY)
Admission: RE | Admit: 2020-07-23 | Discharge: 2020-07-23 | Disposition: A | Discharge status: Home / Self Care | Payer: Medicare Other | Source: Ambulatory Visit | Attending: Hematology | Admitting: Hematology

## 2020-07-23 DIAGNOSIS — D3A090 Benign carcinoid tumor of the bronchus and lung: Secondary | ICD-10-CM | POA: Insufficient documentation

## 2020-07-23 DIAGNOSIS — K449 Diaphragmatic hernia without obstruction or gangrene: Secondary | ICD-10-CM | POA: Diagnosis not present

## 2020-07-23 DIAGNOSIS — U071 COVID-19: Secondary | ICD-10-CM | POA: Diagnosis not present

## 2020-07-23 DIAGNOSIS — J9811 Atelectasis: Secondary | ICD-10-CM | POA: Diagnosis not present

## 2020-07-23 DIAGNOSIS — S22070A Wedge compression fracture of T9-T10 vertebra, initial encounter for closed fracture: Secondary | ICD-10-CM | POA: Diagnosis not present

## 2020-07-23 DIAGNOSIS — I2510 Atherosclerotic heart disease of native coronary artery without angina pectoris: Secondary | ICD-10-CM | POA: Diagnosis not present

## 2020-07-23 DIAGNOSIS — Z20828 Contact with and (suspected) exposure to other viral communicable diseases: Secondary | ICD-10-CM | POA: Diagnosis not present

## 2020-07-24 ENCOUNTER — Encounter: Payer: Self-pay | Admitting: "Endocrinology

## 2020-07-24 ENCOUNTER — Ambulatory Visit (INDEPENDENT_AMBULATORY_CARE_PROVIDER_SITE_OTHER): Payer: Medicare Other | Admitting: "Endocrinology

## 2020-07-24 VITALS — BP 122/64 | HR 80 | Ht 64.0 in | Wt 254.0 lb

## 2020-07-24 DIAGNOSIS — I10 Essential (primary) hypertension: Secondary | ICD-10-CM

## 2020-07-24 DIAGNOSIS — E1122 Type 2 diabetes mellitus with diabetic chronic kidney disease: Secondary | ICD-10-CM | POA: Diagnosis not present

## 2020-07-24 DIAGNOSIS — I5032 Chronic diastolic (congestive) heart failure: Secondary | ICD-10-CM | POA: Diagnosis not present

## 2020-07-24 DIAGNOSIS — E1129 Type 2 diabetes mellitus with other diabetic kidney complication: Secondary | ICD-10-CM | POA: Diagnosis not present

## 2020-07-24 DIAGNOSIS — N184 Chronic kidney disease, stage 4 (severe): Secondary | ICD-10-CM

## 2020-07-24 DIAGNOSIS — E211 Secondary hyperparathyroidism, not elsewhere classified: Secondary | ICD-10-CM | POA: Diagnosis not present

## 2020-07-24 DIAGNOSIS — N189 Chronic kidney disease, unspecified: Secondary | ICD-10-CM | POA: Diagnosis not present

## 2020-07-24 DIAGNOSIS — Z794 Long term (current) use of insulin: Secondary | ICD-10-CM

## 2020-07-24 DIAGNOSIS — D631 Anemia in chronic kidney disease: Secondary | ICD-10-CM | POA: Diagnosis not present

## 2020-07-24 DIAGNOSIS — R809 Proteinuria, unspecified: Secondary | ICD-10-CM | POA: Diagnosis not present

## 2020-07-24 DIAGNOSIS — E559 Vitamin D deficiency, unspecified: Secondary | ICD-10-CM | POA: Diagnosis not present

## 2020-07-24 DIAGNOSIS — E782 Mixed hyperlipidemia: Secondary | ICD-10-CM | POA: Diagnosis not present

## 2020-07-24 DIAGNOSIS — E876 Hypokalemia: Secondary | ICD-10-CM | POA: Diagnosis not present

## 2020-07-24 MED ORDER — VITAMIN D (CHOLECALCIFEROL) 25 MCG (1000 UT) PO TABS: 2000.0000 [IU] | ORAL_TABLET | Freq: Every day | ORAL | 6 refills | Status: DC

## 2020-07-24 NOTE — Progress Notes (Signed)
07/24/2020  Endocrinology follow-up note   Subjective:    Patient ID: [name], female    DOB: 11/18/1955, PCP Rosita Fire, MD   Past Medical History:  Diagnosis Date  . Arthritis   . Asthma   . Atrial fibrillation ([place])   . Carcinoid tumor determined by biopsy of lung 05/2018   left lung  . Chronic diastolic CHF (congestive heart failure) ([place])   . CKD (chronic kidney disease), stage III ([place])   . Diabetes mellitus type 2 in obese ([place])   . Hypertension   . Iron deficiency anemia 10/16/2010  . Mental handicap 10/16/2010  . Mild CAD 2013  . Morbid obesity ([place])   . OSA (obstructive sleep apnea)    Past Surgical History:  Procedure Laterality Date  . ABDOMINAL HYSTERECTOMY    . AV FISTULA PLACEMENT Right 06/06/2018   Procedure: ARTERIOVENOUS (AV) FISTULA CREATION;  Surgeon: Marty Heck, MD;  Location: [facility];  Service: Vascular;  Laterality: Right;  . CESAREAN SECTION    . CHOLECYSTECTOMY    . COLONOSCOPY  08/2010   normal TI, sigmoid polyp (adenoma ). Next TCS due  08/2015,  . COLONOSCOPY WITH PROPOFOL N/A 07/08/2020   Procedure: COLONOSCOPY WITH PROPOFOL;  Surgeon: Daneil Dolin, MD;  [facility];  Service: Endoscopy;  Laterality: N/A;  AM (diabetic and facility patient)  . ESOPHAGOGASTRODUODENOSCOPY  08/2010   antral and duodenal erosions s/p bx (chronic gastritis, no h.pylori, no celiac dz ), hiatal hernia  . FISTULA SUPERFICIALIZATION Right 08/25/2018   Procedure: FISTULA SUPERFICIALIZATION RIGHT ARM;  Surgeon: Waynetta Sandy, MD;  Location: [facility];  Service: Cardiovascular;  Laterality: Right;  . IR FLUORO GUIDE CV LINE RIGHT  05/31/2018  . IR US GUIDE VASC ACCESS RIGHT  05/31/2018  . KNEE SURGERY     right knee @ 65 years of age  . LEFT AND RIGHT HEART CATHETERIZATION WITH CORONARY ANGIOGRAM N/A 09/21/2011   Procedure: LEFT AND RIGHT HEART CATHETERIZATION WITH CORONARY ANGIOGRAM;  Surgeon: Birdie Riddle, MD;  Location: [facility];   Service: Cardiovascular;  Laterality: N/A;  . POLYPECTOMY  07/08/2020   Procedure: POLYPECTOMY INTESTINAL;  Surgeon: Daneil Dolin, MD;  [facility];  Service: Endoscopy;;   Social History   Socioeconomic History  . Marital status: Divorced    Spouse name: Not on file  . Number of children: 1  . Years of education: Not on file  . Highest education level: Not on file  Occupational History    Employer: UNEMPLOYED  Tobacco Use  . Smoking status: Former Smoker    Packs/day: 1.00    Years: 0.00    Pack years: 0.00    Types: Cigarettes    Quit date: 04/11/1996    Years since quitting: 24.3  . Smokeless tobacco: Never Used  . Tobacco comment: quit a couple year ago  Vaping Use  . Vaping Use: Never used  Substance and Sexual Activity  . Alcohol use: No  . Drug use: No  . Sexual activity: Never  Other Topics Concern  . Not on file  Social History Narrative   Lives with parents.    Social Determinants of Health   Financial Resource Strain: Not on file  Food Insecurity: Not on file  Transportation Needs: Not on file  Physical Activity: Not on file  Stress: Not on file  Social Connections: Not on file   Outpatient Encounter Medications as of 07/24/2020  Medication Sig  .  acetaminophen (TYLENOL) 325 MG tablet Take 325 mg by mouth every 12 (twelve) hours as needed for moderate pain.  [place] Kitchen albuterol (PROVENTIL HFA;VENTOLIN HFA) 108 (90 Base) MCG/ACT inhaler Inhale 2 puffs into the lungs every 6 (six) hours as needed for shortness of breath.  [place] Kitchen apixaban (ELIQUIS) 5 MG TABS tablet Take 1 tablet (5 mg total) by mouth 2 (two) times daily. (Patient taking differently: Take 5 mg by mouth 2 (two) times daily. (0800 & 2000))  . betamethasone dipropionate (DIPROLENE) 0.05 % ointment Apply 1 application topically daily as needed (for rash under breasts).   . cetirizine (ZYRTEC) 10 MG tablet Take 10 mg by mouth daily. (0800)  . clotrimazole (LOTRIMIN) 1 % cream Apply 1 application  topically daily as needed (under breasts as needed for rash).  . DIABETIC TUSSIN EX 100 MG/5ML syrup Take 100 mg by mouth every 4 (four) hours as needed for cough or congestion.   [place] Kitchen diltiazem (CARDIZEM CD) 240 MG 24 hr capsule Take 1 capsule (240 mg total) by mouth daily. (Patient taking differently: Take 240 mg by mouth daily. (0800))  . fluticasone (FLONASE) 50 MCG/ACT nasal spray Place 2 sprays into both nostrils daily as needed for allergies or rhinitis.  . Fluticasone-Umeclidin-Vilant 100-62.5-25 MCG/INH AEPB Inhale 1 puff into the lungs daily. (0800)  . furosemide (LASIX) 40 MG tablet Take 40 mg by mouth 2 (two) times daily.  [place] Kitchen glucose blood (EASYMAX TEST) test strip USE TO CHECK BLOOD SUGAR 4 TIMES DAILY.  [place] Kitchen guaiFENesin (MUCINEX) 600 MG 12 hr tablet Take 1 tablet (600 mg total) by mouth 2 (two) times daily as needed. (Patient taking differently: Take 600 mg by mouth 2 (two) times daily as needed (cough/congestion).)  . hydrALAZINE (APRESOLINE) 25 MG tablet Take 1 tablet (25 mg total) by mouth every 8 (eight) hours for 30 days. (Patient taking differently: Take 25 mg by mouth 3 (three) times daily. (0800, 1400 & 2000))  . insulin regular human CONCENTRATED (HUMULIN R U-500 KWIKPEN) 500 UNIT/ML kwikpen INJECT 40UNITS SUBCUTANEOUSLY WITH BREAKFAST, 30 UNITS WITH LUNCH, & 30 UNITS WITH SUPPER WHEN BLOOD SUGAR IS > 90MG /DL AND PATIENT IS EATING. (Patient taking differently: Inject 30-40 Units into the skin See admin instructions. INJECT 40UNITS SUBCUTANEOUSLY WITH BREAKFAST, 30 UNITS WITH LUNCH, & 30 UNITS WITH SUPPER WHEN BLOOD SUGAR IS > 90MG /DL AND PATIENT IS EATING.)  . ipratropium-albuterol (DUONEB) 0.5-2.5 (3) MG/3ML SOLN Take 3 mLs by nebulization every 6 (six) hours as needed (for cough).   . isosorbide dinitrate (ISORDIL) 10 MG tablet Take 1 tablet (10 mg total) by mouth 3 (three) times daily. (Patient taking differently: Take 10 mg by mouth 3 (three) times daily. (0800, 1400 & 2000))  .  Lancets 28G MISC USE TO CHECK BLOOD SUGAR 4 TIMES DAILY. DX E11.22  . metolazone (ZAROXOLYN) 5 MG tablet Take 5 mg by mouth in the morning. (0800)  . metoprolol tartrate (LOPRESSOR) 50 MG tablet Take 50 mg by mouth 2 (two) times daily. (0800 & 2000)  . montelukast (SINGULAIR) 10 MG tablet Take 10 mg by mouth at bedtime. (2000)  . polyethylene glycol powder (GLYCOLAX/MIRALAX) 17 GM/SCOOP powder Take 17 g by mouth daily as needed for mild constipation or moderate constipation.  . potassium chloride SA (KLOR-CON) 20 MEQ tablet Take 20 mEq by mouth 3 (three) times daily. (0800, 1400 & 2000)  . rosuvastatin (CRESTOR) 20 MG tablet TAKE (1) TABLET BY MOUTH ONCE DAILY. (Patient taking differently: Take 20 mg by mouth daily at 8  pm. (2000))  . triamcinolone cream (KENALOG) 0.5 % Apply 1 application topically every 12 (twelve) hours as needed (applied to bilateral legs for allergic dermatitis).   . Vitamin D, Cholecalciferol, 25 MCG (1000 UT) TABS Take 2,000 Units by mouth daily. (0800)  . [DISCONTINUED] Vitamin D, Cholecalciferol, 25 MCG (1000 UT) TABS Take 1,000 Units by mouth daily. (0800)   No facility-administered encounter medications on file as of 07/24/2020.   ALLERGIES: No Known Allergies VACCINATION STATUS: Immunization History  Administered Date(s) Administered  . Hepatitis B, adult 07/29/2018, 08/29/2018, 10/07/2018  . Influenza Inj Mdck Quad Pf 12/19/2016  . Influenza,inj,Quad PF,6+ Mos 02/02/2013, 05/15/2018  . Influenza-Unspecified 05/15/2018  . PPD Test 07/22/2018  . Pneumococcal Polysaccharide-23 05/15/2018  . Pneumococcal-Unspecified 05/15/2018    Diabetes She presents for her follow-up diabetic visit. She has type 2 diabetes mellitus. Onset time: she was diagnosed at approximate age of 35 years. Her disease course has been improving. There are no hypoglycemic associated symptoms. Pertinent negatives for hypoglycemia include no confusion, headaches, pallor or seizures. Pertinent  negatives for diabetes include no blurred vision, no chest pain, no fatigue, no polydipsia, no polyphagia and no polyuria. There are no hypoglycemic complications. Symptoms are improving. Diabetic complications include nephropathy. Risk factors for coronary artery disease include diabetes mellitus, dyslipidemia, hypertension, obesity and sedentary lifestyle. Current diabetic treatment includes intensive insulin program. Her weight is fluctuating minimally. She is following a generally unhealthy diet. She has had a previous visit with a dietitian. She never participates in exercise. Her home blood glucose trend is fluctuating minimally. Her breakfast blood glucose range is generally 180-200 mg/dl. Her lunch blood glucose range is generally 140-180 mg/dl. Her dinner blood glucose range is generally 140-180 mg/dl. Her bedtime blood glucose range is generally 180-200 mg/dl. Her overall blood glucose range is 180-200 mg/dl. (She is accompanied by her aide from nursing home.  She presents with continued improvement in her glycemic profile with slightly above target fasting readings.  Her previsit A1c was 7.4%, overall improving.  She did not have major hypoglycemia reported or documented lately.   ) An ACE inhibitor/angiotensin II receptor blocker is being taken.  Hypertension This is a chronic problem. The current episode started more than 1 year ago. The problem is uncontrolled. Pertinent negatives include no blurred vision, chest pain, headaches, palpitations or shortness of breath. Risk factors for coronary artery disease include diabetes mellitus, dyslipidemia, obesity and sedentary lifestyle. Past treatments include ACE inhibitors. Hypertensive end-organ damage includes kidney disease. Identifiable causes of hypertension include chronic renal disease.  Hyperlipidemia This is a chronic problem. The current episode started more than 1 year ago. The problem is uncontrolled. Exacerbating diseases include chronic  renal disease, diabetes and obesity. Pertinent negatives include no chest pain, myalgias or shortness of breath. Current antihyperlipidemic treatment includes statins and fibric acid derivatives. Risk factors for coronary artery disease include diabetes mellitus, dyslipidemia, obesity, a sedentary lifestyle and hypertension.    Review of Systems  Constitutional: Negative for chills, fatigue and fever.  HENT: Negative for trouble swallowing and voice change.   Eyes: Negative for blurred vision and visual disturbance.  Respiratory: Negative for cough, shortness of breath and wheezing.   Cardiovascular: Negative for chest pain, palpitations and leg swelling.  Gastrointestinal: Negative for diarrhea, nausea and vomiting.  Endocrine: Negative for cold intolerance, heat intolerance, polydipsia, polyphagia and polyuria.  Musculoskeletal: Positive for gait problem. Negative for arthralgias and myalgias.  Skin: Negative for color change, pallor, rash and wound.  Neurological: Negative for  seizures and headaches.  Hematological: Does not bruise/bleed easily.  Psychiatric/Behavioral: Negative for confusion and suicidal ideas.    Objective:    BP 122/64   Pulse 80   Ht 5\' 4"  (1.626 m)   Wt 254 lb (115.2 kg)   BMI 43.60 kg/m   Wt Readings from Last 3 Encounters:  07/24/20 254 lb (115.2 kg)  06/04/20 257 lb 12.8 oz (116.9 kg)  05/08/20 251 lb 9.6 oz (114.1 kg)    Physical Exam Constitutional:      Appearance: She is well-developed.  HENT:     Head: Normocephalic and atraumatic.  Neck:     Thyroid: No thyromegaly.     Trachea: No tracheal deviation.  Cardiovascular:     Rate and Rhythm: Normal rate.  Pulmonary:     Effort: Pulmonary effort is normal.  Abdominal:     Tenderness: There is no abdominal tenderness. There is no guarding.  Musculoskeletal:     Cervical back: Normal range of motion and neck supple.  Skin:    General: Skin is dry.     Coloration: Skin is not pale.      Findings: No erythema or rash.  Neurological:     Mental Status: She is alert and oriented to person, place, and time.     Cranial Nerves: No cranial nerve deficit.     Coordination: Coordination normal.  Psychiatric:        Judgment: Judgment normal.     Results for orders placed or performed during the hospital encounter of 07/08/20  Glucose, capillary  Result Value Ref Range   Glucose-Capillary 189 (H) 70 - 99 mg/dL   Complete Blood Count (Most recent): Lab Results  Component Value Date   WBC 8.2 06/04/2020   HGB 9.8 (L) 06/04/2020   HCT 31.6 (L) 06/04/2020   MCV 86.3 06/04/2020   PLT 250 06/04/2020   Chemistry (most recent): Diabetic Labs (most recent): Lab Results  Component Value Date   HGBA1C 7.4 05/07/2020   HGBA1C 7.6 (A) 04/25/2020   HGBA1C 7.3 (A) 01/22/2020   Lipid Panel     Component Value Date/Time   CHOL 127 05/07/2020 0000   CHOL 128 04/23/2020 0820   TRIG 150 05/07/2020 0000   HDL 42 05/07/2020 0000   HDL 43 04/23/2020 0820   CHOLHDL 3.0 04/23/2020 0820   CHOLHDL 5.5 (H) 09/12/2019 0831   VLDL 59 (H) 06/02/2018 0029   LDLCALC 62 05/07/2020 0000   LDLCALC 60 04/23/2020 0820   LDLCALC 124 (H) 09/12/2019 0831    Assessment & Plan:   1. Type 2 diabetes mellitus with stage 3-4 chronic kidney disease, with long-term current use of insulin   -Her diabetes is  complicated by CKD, congestive heart failure,  obesity/sedentary life, and patient remains at extremely high risk for more acute and chronic complications of diabetes which include CAD, CVA, CKD, retinopathy, and neuropathy. These are all discussed in detail with the patient.  She is accompanied by her aide from nursing home.  She presents with continued improvement in her glycemic profile with slightly above target fasting readings.  Her previsit A1c was 7.4%, overall improving.  She did not have major hypoglycemia reported or documented lately.   - I have re-counseled the patient on diet  management and weight loss  by adopting a carbohydrate restricted / protein rich  Diet.  - she acknowledges that there is a room for improvement in her food and drink choices. - Suggestion is made for her  to avoid simple carbohydrates  from her diet including Cakes, Sweet Desserts, Ice Cream, Soda (diet and regular), Sweet Tea, Candies, Chips, Cookies, Store Bought Juices, Alcohol in Excess of  1-2 drinks a day, Artificial Sweeteners,  Coffee Creamer, and "Sugar-free" Products, Lemonade. This will help patient to have more stable blood glucose profile and potentially avoid unintended weight gain.   - Patient is advised to stick to a routine mealtimes to eat 3 meals  a day and avoid unnecessary snacks ( to snack only to correct hypoglycemia).  - I have approached patient with the following individualized plan to manage diabetes and patient agrees.  -Her placement in group home is the best development for her lately.  -Her presenting glycemic profile is on or near target, except slightly above target fasting blood glucose readings.   -It is better for her to stay on Humulin RU 500 insulin for the sake of simplicity.  -She is advised to increase Humulin U500 to 40 units with breakfast, 30 units with lunch, and 40 units with supper  when glucose is above 90 mg/dl associated with monitoring of blood glucose before meals and at bedtime. - She is advised to skip insulin if pre-meal blood glucose is below 90 mg/dL or if she is not eating. -She is not a candidate for metformin, SGLT2 inhibitors due to CKD.   - Patient specific target  for A1c; LDL, HDL, Triglycerides, were discussed in detail.  2) BP/HTN:  -Her blood pressure is controlled to target.  She has urine microalbuminuria .She is advised on  salt restriction  and I advised her to continue current medications including lisinopril 20 mg p.o. daily, hydralazine 25 mg p.o. 3 times daily, furosemide 40 mg p.o. Daily.  3) Lipids/HPL: Her recent  lipid panel showed loss of control of dyslipidemia, LDL at 124 increasing from 80.  She is tolerating Crestor, advised to continue Crestor 20 mg p.o. nightly.   She is also on fenofibrate.     4)  Weight/Diet: Her BMI is 62.6- -clearly complicating her diabetes care.   She is a candidate for modest weight loss.  CDE consult in progress, exercise, and carbohydrates information provided.  5) Chronic Care/Health Maintenance:  -Patient is  on ACEI and encouraged to continue to follow up with Ophthalmology, Podiatrist at least yearly or according to recommendations, and advised to stay away from smoking. I have recommended yearly flu vaccine and pneumonia vaccination at least every 5 years;  and  sleep for at least 7 hours a day.    -She is advised to maintain close follow-up with her nephrologist.  - I advised patient to maintain close follow up with Rosita Fire, MD for primary care needs.    I spent 42 minutes in the care of the patient today including review of labs from [facility], Lipids, Thyroid Function, Hematology (current and previous including abstractions from other facilities); face-to-face time discussing  her blood glucose readings/logs, discussing hypoglycemia and hyperglycemia episodes and symptoms, medications doses, her options of short and long term treatment based on the latest standards of care / guidelines;  discussion about incorporating lifestyle medicine;  and documenting the encounter.    Please refer to Patient Instructions for Blood Glucose Monitoring and Insulin/Medications Dosing Guide"  in media tab for additional information. Please  also refer to " Patient Self Inventory" in the Media  tab for reviewed elements of pertinent patient history.  [name] participated in the discussions, expressed understanding, and voiced agreement with the  above plans.  All questions were answered to her satisfaction. she is encouraged to contact clinic should she have any questions or  concerns prior to her return visit.     Follow up plan: -Return in about 4 months (around 11/23/2020) for F/U with Pre-visit Labs, Meter, Logs, A1c here.Glade Lloyd, MD Phone: [phone]  Fax: [phone]  This note was partially dictated with voice recognition software. Similar sounding words can be transcribed inadequately or may not  be corrected upon review.  07/24/2020, 10:21 AM

## 2020-07-24 NOTE — Patient Instructions (Signed)

## 2020-07-30 DIAGNOSIS — U071 COVID-19: Secondary | ICD-10-CM | POA: Diagnosis not present

## 2020-07-30 DIAGNOSIS — Z20828 Contact with and (suspected) exposure to other viral communicable diseases: Secondary | ICD-10-CM | POA: Diagnosis not present

## 2020-07-31 NOTE — Progress Notes (Signed)
HEMATOLOGY/ONCOLOGY CONSULTATION NOTE  Date of Service: 08/01/2020  Patient Care Team: Rosita Fire, MD as PCP - General (Internal Medicine) Satira Sark, MD as PCP - Cardiology (Cardiology) Danie Binder, MD (Inactive) (Gastroenterology) [name] Cristopher Estimable, MD as Consulting Physician (Gastroenterology)  [place] [phone] Legal Guardian [employer]  CHIEF COMPLAINTS/PURPOSE OF CONSULTATION:  Bronchopulmonary Carcinoid  HISTORY OF PRESENTING ILLNESS:  [name] is a65 y.o.F with mental disability in LTC, asthma well-controlled, CKD III Baseline Cr 1.3-1.6, HTN, and DM who presented with severe dyspnea requiring BiPAP by EMS. On arrival in the ER was in V. tach, cardiology consulted and given amiodarone bolus, then developed unstable wide-complex tachycardia and acute respiratory failure. Creatinine 4.3, potassium 7.5 on admission, pH 7.15, PCO2 45, lactic acid 3.2, chest x-ray pneumonia. Temporizing potassium measures given, broad-spectrum antibiotics started, intubated and transferred to ICU at [facility]. Extubated 2/28 after 6 days.  The patient was recently admitted to the hospital earlier in February 2020 for community-acquired pneumonia.  During her hospitalization, the patient had a CT of the chest performed on 05/13/2018 which showed a masslike consolidation measuring 5 x 8.2 cm over the left lower lobe.  It was recommended that she have a follow-up CT scan performed within the next month as an outpatient.  During this current hospitalization, it was recommended that she undergo TEE and bronchoscopy.  This was performed on 05/26/2018.  Biopsy results showed typical carcinoid tumor and atypical squamous metaplasia, favor reactive.    When seen today, the patient had just finished dialysis.  She reports having fatigue and a cough.  She states that she has occasional blood-tinged sputum when she coughs.  She denies fevers and chills.  Denies chest pain and shortness  of breath.  Denies abdominal discomfort, nausea, vomiting, constipation, diarrhea.  She reports intermittent headaches. Oncology was consulted for recommendations regarding her carcinoid tumor.  Of note, the patient reside in an assisted living facility in [place], [place].  She tells me that she has a son who visits her regularly but he is not her power of attorney.  She has a guardian [name]) who oversees her care.  Telephone numbers are on the chart.  INTERVAL HISTORY:   [name] returns today for management and evaluation of her Bronchopulmonary carcinoid. The patient's last visit with [place] was on 06/04/2020. The pt reports that she is doing well overall. We are joined today by a helper from her facility.  The pt reports no new symptoms or concerns. The pt notes that she has been having seasonal allergies that have caused some coughing and sneezing, but nothing out of the ordinary.  Of note since the patient's last visit, pt has had CT Chest wo contrast ([id number]) on 07/23/2020, which revealed "1. Central left lower lobe solid pulmonary nodule occluding the central left lower lobe airways is poorly delineated on this noncontrast CT, estimated at 1.5 x 1.5 cm, not appreciably changed, compatible with biopsy-proven carcinoid. Anterior left lower lobe postobstructive atelectasis. 2. No evidence of metastatic disease in the chest. 3. Stable mild cardiomegaly. Three-vessel coronary atherosclerosis. 4. Small hiatal hernia. 5. Finely irregular liver surface is suggestive of cirrhosis. 6. Stable right adrenal adenoma. 7. Chronic moderate T10 vertebral compression fracture. 8. Aortic Atherosclerosis (ICD10-I70.0)."  Lab results today 06/04/2020 of CBC w/diff and CMP is as follows: all values are WNL except for RBC of 3.66, Hgb of 9.8, HCT of 31.6, Chloride of 96, Glucose of 261, BUN of 49,  Creatinine of 2.09, Albumin of 3.4, AST of 13, GFR est of 26. 06/04/2020 Chromogranin A of  202.5.  On review of systems, pt reports seasonal allergies and denies fevers, chills, difficulty sleeping, decreased appetite, SOB, back pain, abdominal pain, difficulty moving bowels, leg swelling, and any other symptoms.   MEDICAL HISTORY:  Past Medical History:  Diagnosis Date  . Arthritis   . Asthma   . Atrial fibrillation ([place])   . Carcinoid tumor determined by biopsy of lung 05/2018   left lung  . Chronic diastolic CHF (congestive heart failure) ([place])   . CKD (chronic kidney disease), stage III ([place])   . Diabetes mellitus type 2 in obese ([place])   . Hypertension   . Iron deficiency anemia 10/16/2010  . Mental handicap 10/16/2010  . Mild CAD 2013  . Morbid obesity ([place])   . OSA (obstructive sleep apnea)     SURGICAL HISTORY: Past Surgical History:  Procedure Laterality Date  . ABDOMINAL HYSTERECTOMY    . AV FISTULA PLACEMENT Right 06/06/2018   Procedure: ARTERIOVENOUS (AV) FISTULA CREATION;  Surgeon: Marty Heck, MD;  Location: [facility];  Service: Vascular;  Laterality: Right;  . CESAREAN SECTION    . CHOLECYSTECTOMY    . COLONOSCOPY  08/2010   normal TI, sigmoid polyp (adenoma ). Next TCS due  08/2015,  . COLONOSCOPY WITH PROPOFOL N/A 07/08/2020   Procedure: COLONOSCOPY WITH PROPOFOL;  Surgeon: Daneil Dolin, MD;  [facility];  Service: Endoscopy;  Laterality: N/A;  AM (diabetic and facility patient)  . ESOPHAGOGASTRODUODENOSCOPY  08/2010   antral and duodenal erosions s/p bx (chronic gastritis, no h.pylori, no celiac dz ), hiatal hernia  . FISTULA SUPERFICIALIZATION Right 08/25/2018   Procedure: FISTULA SUPERFICIALIZATION RIGHT ARM;  Surgeon: Waynetta Sandy, MD;  Location: [facility];  Service: Cardiovascular;  Laterality: Right;  . IR FLUORO GUIDE CV LINE RIGHT  05/31/2018  . IR US GUIDE VASC ACCESS RIGHT  05/31/2018  . KNEE SURGERY     right knee @ 65 years of age  . LEFT AND RIGHT HEART CATHETERIZATION WITH CORONARY ANGIOGRAM N/A 09/21/2011    Procedure: LEFT AND RIGHT HEART CATHETERIZATION WITH CORONARY ANGIOGRAM;  Surgeon: Birdie Riddle, MD;  Location: [facility];  Service: Cardiovascular;  Laterality: N/A;  . POLYPECTOMY  07/08/2020   Procedure: POLYPECTOMY INTESTINAL;  Surgeon: Daneil Dolin, MD;  [facility];  Service: Endoscopy;;    SOCIAL HISTORY: Social History   Socioeconomic History  . Marital status: Divorced    Spouse name: Not on file  . Number of children: 1  . Years of education: Not on file  . Highest education level: Not on file  Occupational History    Employer: UNEMPLOYED  Tobacco Use  . Smoking status: Former Smoker    Packs/day: 1.00    Years: 0.00    Pack years: 0.00    Types: Cigarettes    Quit date: 04/11/1996    Years since quitting: 24.3  . Smokeless tobacco: Never Used  . Tobacco comment: quit a couple year ago  Vaping Use  . Vaping Use: Never used  Substance and Sexual Activity  . Alcohol use: No  . Drug use: No  . Sexual activity: Never  Other Topics Concern  . Not on file  Social History Narrative   Lives with parents.    Social Determinants of Health   Financial Resource Strain: Not on file  Food Insecurity: Not on file  Transportation Needs:  Not on file  Physical Activity: Not on file  Stress: Not on file  Social Connections: Not on file  Intimate Partner Violence: Not on file    FAMILY HISTORY: Family History  Problem Relation Age of Onset  . Diabetes Mother   . Hypertension Mother   . Heart failure Mother   . Hypertension Father   . Atrial fibrillation Father   . Kidney disease Other        son reportedly has had cyst on kidney and lung s/p surgery at [facility]  . Hypertension Son   . Colon cancer Neg Hx     ALLERGIES:  has No Known Allergies.  MEDICATIONS:  Current Outpatient Medications  Medication Sig Dispense Refill  . acetaminophen (TYLENOL) 325 MG tablet Take 325 mg by mouth every 12 (twelve) hours as needed for moderate pain.    Marland Kitchen  albuterol (PROVENTIL HFA;VENTOLIN HFA) 108 (90 Base) MCG/ACT inhaler Inhale 2 puffs into the lungs every 6 (six) hours as needed for shortness of breath.    Marland Kitchen apixaban (ELIQUIS) 5 MG TABS tablet Take 1 tablet (5 mg total) by mouth 2 (two) times daily. (Patient taking differently: Take 5 mg by mouth 2 (two) times daily. (0800 & 2000)) 60 tablet 0  . betamethasone dipropionate (DIPROLENE) 0.05 % ointment Apply 1 application topically daily as needed (for rash under breasts).     . cetirizine (ZYRTEC) 10 MG tablet Take 10 mg by mouth daily. (0800)    . clotrimazole (LOTRIMIN) 1 % cream Apply 1 application topically daily as needed (under breasts as needed for rash).    . DIABETIC TUSSIN EX 100 MG/5ML syrup Take 100 mg by mouth every 4 (four) hours as needed for cough or congestion.     Marland Kitchen diltiazem (CARDIZEM CD) 240 MG 24 hr capsule Take 1 capsule (240 mg total) by mouth daily. (Patient taking differently: Take 240 mg by mouth daily. (0800)) 30 capsule 0  . fluticasone (FLONASE) 50 MCG/ACT nasal spray Place 2 sprays into both nostrils daily as needed for allergies or rhinitis.    . Fluticasone-Umeclidin-Vilant 100-62.5-25 MCG/INH AEPB Inhale 1 puff into the lungs daily. (0800)    . furosemide (LASIX) 40 MG tablet Take 40 mg by mouth 2 (two) times daily.    Marland Kitchen glucose blood (EASYMAX TEST) test strip USE TO CHECK BLOOD SUGAR 4 TIMES DAILY. 100 strip 2  . guaiFENesin (MUCINEX) 600 MG 12 hr tablet Take 1 tablet (600 mg total) by mouth 2 (two) times daily as needed. (Patient taking differently: Take 600 mg by mouth 2 (two) times daily as needed (cough/congestion).) 60 tablet 11  . hydrALAZINE (APRESOLINE) 25 MG tablet Take 1 tablet (25 mg total) by mouth every 8 (eight) hours for 30 days. (Patient taking differently: Take 25 mg by mouth 3 (three) times daily. (0800, 1400 & 2000)) 90 tablet 0  . insulin regular human CONCENTRATED (HUMULIN R U-500 KWIKPEN) 500 UNIT/ML kwikpen INJECT 40UNITS SUBCUTANEOUSLY WITH  BREAKFAST, 30 UNITS WITH LUNCH, & 30 UNITS WITH SUPPER WHEN BLOOD SUGAR IS > 90MG /DL AND PATIENT IS EATING. (Patient taking differently: Inject 30-40 Units into the skin See admin instructions. INJECT 40UNITS SUBCUTANEOUSLY WITH BREAKFAST, 30 UNITS WITH LUNCH, & 30 UNITS WITH SUPPER WHEN BLOOD SUGAR IS > 90MG /DL AND PATIENT IS EATING.) 20 mL 0  . ipratropium-albuterol (DUONEB) 0.5-2.5 (3) MG/3ML SOLN Take 3 mLs by nebulization every 6 (six) hours as needed (for cough).     . isosorbide dinitrate (ISORDIL) 10 MG tablet  Take 1 tablet (10 mg total) by mouth 3 (three) times daily. (Patient taking differently: Take 10 mg by mouth 3 (three) times daily. (0800, 1400 & 2000)) 90 tablet 0  . Lancets 28G MISC USE TO CHECK BLOOD SUGAR 4 TIMES DAILY. DX E11.22 200 each 5  . metolazone (ZAROXOLYN) 5 MG tablet Take 5 mg by mouth in the morning. (0800)    . metoprolol tartrate (LOPRESSOR) 50 MG tablet Take 50 mg by mouth 2 (two) times daily. (0800 & 2000)    . montelukast (SINGULAIR) 10 MG tablet Take 10 mg by mouth at bedtime. (2000)    . polyethylene glycol powder (GLYCOLAX/MIRALAX) 17 GM/SCOOP powder Take 17 g by mouth daily as needed for mild constipation or moderate constipation.    . potassium chloride SA (KLOR-CON) 20 MEQ tablet Take 20 mEq by mouth 3 (three) times daily. (0800, 1400 & 2000)    . rosuvastatin (CRESTOR) 20 MG tablet TAKE (1) TABLET BY MOUTH ONCE DAILY. (Patient taking differently: Take 20 mg by mouth daily at 8 pm. (2000)) 30 tablet 3  . triamcinolone cream (KENALOG) 0.5 % Apply 1 application topically every 12 (twelve) hours as needed (applied to bilateral legs for allergic dermatitis).     . Vitamin D, Cholecalciferol, 25 MCG (1000 UT) TABS Take 2,000 Units by mouth daily. (0800) 60 tablet 6   No current facility-administered medications for this visit.    REVIEW OF SYSTEMS:   10 Point review of Systems was done is negative except as noted above.  PHYSICAL EXAMINATION: ECOG PERFORMANCE  STATUS: 3 - Symptomatic, >50% confined to bed  . Vitals:   08/01/20 0850  BP: (!) 140/59  Pulse: (!) 58  Resp: 19  Temp: 97.8 F (36.6 C)  SpO2: 98%   Filed Weights   08/01/20 0850  Weight: 252 lb (114.3 kg)   .Body mass index is 43.26 kg/m.   Exam was given in a wheelchair.  GENERAL:alert, in no acute distress and comfortable SKIN: no acute rashes, no significant lesions EYES: conjunctiva are pink and non-injected, sclera anicteric OROPHARYNX: MMM, no exudates, no oropharyngeal erythema or ulceration NECK: supple, no JVD LYMPH:  no palpable lymphadenopathy in the cervical, axillary or inguinal regions LUNGS: clear to auscultation b/l with normal respiratory effort HEART: regular rate & rhythm ABDOMEN:  normoactive bowel sounds , non tender, not distended. Extremity: no pedal edema PSYCH: alert & oriented x 3 with fluent speech NEURO: no focal motor/sensory deficits  LABORATORY DATA:  I have reviewed the data as listed  . CBC Latest Ref Rng & Units 06/04/2020 09/20/2019 09/19/2019  WBC 4.0 - 10.5 K/uL 8.2 8.9 14.8(H)  Hemoglobin 12.0 - 15.0 g/dL 9.8(L) 10.1(L) 10.9(L)  Hematocrit 36.0 - 46.0 % 31.6(L) 31.5(L) 33.9(L)  Platelets 150 - 400 K/uL 250 228 353    . CMP Latest Ref Rng & Units 06/04/2020 05/07/2020 04/23/2020  Glucose 70 - 99 mg/dL 261(H) - 188(H)  BUN 8 - 23 mg/dL 49(H) 48(A) 37(H)  Creatinine 0.44 - 1.00 mg/dL 2.09(H) 1.6(A) 1.64(H)  Sodium 135 - 145 mmol/L 136 141 140  Potassium 3.5 - 5.1 mmol/L 3.6 3.5 4.5  Chloride 98 - 111 mmol/L 96(L) 100 100  CO2 22 - 32 mmol/L 32 31(A) 26  Calcium 8.9 - 10.3 mg/dL 9.2 9.0 9.0  Total Protein 6.5 - 8.1 g/dL 7.7 - 6.9  Total Bilirubin 0.3 - 1.2 mg/dL 0.3 - 0.2  Alkaline Phos 38 - 126 U/L 81 72 83  AST 15 -  41 U/L 13(L) 13 15  ALT 0 - 44 U/L 11 10 12     05/26/18 Biopsy:    RADIOGRAPHIC STUDIES: I have personally reviewed the radiological images as listed and agreed with the findings in the report. CT Chest Wo  Contrast  Result Date: 07/23/2020 CLINICAL DATA:  Follow-up biopsy-proven left lower lobe pulmonary carcinoid tumor. Cough. EXAM: CT CHEST WITHOUT CONTRAST TECHNIQUE: Multidetector CT imaging of the chest was performed following the standard protocol without IV contrast. COMPARISON:  12/22/2018 [place] PET-CT.  05/13/2018 chest CT. FINDINGS: Cardiovascular: Stable mild cardiomegaly. No significant pericardial effusion/thickening. Three-vessel coronary atherosclerosis. Atherosclerotic nonaneurysmal thoracic aorta. Top-normal caliber main pulmonary artery (3.2 cm diameter). Mediastinum/Nodes: No discrete thyroid nodules. Unremarkable esophagus. No pathologically enlarged axillary, mediastinal or hilar lymph nodes, noting limited sensitivity for the detection of hilar adenopathy on this noncontrast study. Lungs/Pleura: No pneumothorax. No pleural effusion. Central left lower lobe solid pulmonary nodule occluding the central left lower lobe airways is poorly delineated on this noncontrast CT, estimated at 1.5 x 1.5 cm (series 7/image 70), previously approximately 1.5 x 1.4 cm, not appreciably changed. Bandlike consolidation and volume loss in the anterior left lower lobe is compatible with postobstructive atelectasis. No new significant pulmonary nodules. Upper abdomen: Small hiatal hernia. Cholecystectomy. Finely irregular liver surface is suggestive of cirrhosis. Right adrenal 3.2 cm nodule with density 12 HU, stable, compatible with an adenoma. Musculoskeletal: No aggressive appearing focal osseous lesions. Chronic moderate T10 vertebral compression fracture. Mild thoracic spondylosis. IMPRESSION: 1. Central left lower lobe solid pulmonary nodule occluding the central left lower lobe airways is poorly delineated on this noncontrast CT, estimated at 1.5 x 1.5 cm, not appreciably changed, compatible with biopsy-proven carcinoid. Anterior left lower lobe postobstructive atelectasis. 2. No evidence of metastatic  disease in the chest. 3. Stable mild cardiomegaly. Three-vessel coronary atherosclerosis. 4. Small hiatal hernia. 5. Finely irregular liver surface is suggestive of cirrhosis. 6. Stable right adrenal adenoma. 7. Chronic moderate T10 vertebral compression fracture. 8. Aortic Atherosclerosis (ICD10-I70.0). Electronically Signed   By: Ilona Sorrel M.D.   On: 07/23/2020 16:58    ASSESSMENT & PLAN:  65 y.o. female with  1.  Typical carcinoid tumor - lung primary  2.  Anemia: Likely due to her renal disease. No transfusion is indicated.   She is currently on Aranesp.  3.  Kidney disease:  Management per nephrology.  PLAN: -Discussed pt labwork, 06/04/2020; chromogranin A elevated, CKD, mild anemia. -Discussed pt CT Chest wo contrast ([id number]) on 07/23/2020; nodule not significantly changed over two years in left lower lung and still about 1.5 cm. -Advised pt the nodule is pushing against and narrowing one of the lower lobe airways, but the pt is not showing any symptoms related to this. If this were to grow, it could cause some of the lung to collapse and be destructive. -Advised pt that surgery is not an option due to location of nodule. -Discussed options for future-- continue active surveillance versus starting to treat it so that it does not collapse the lung. Would talk to Pulmonologist and Radiologist to see if they could do anything as well. -Recommended that pt at least discuss with Radiologist regarding if there is anything that can do locally. The pt is agreeable to this. The pt notes her current guardian is [name] to her knowledge. -Will see back in 6 months with labs.   FOLLOW UP: Refer to radiation oncology if legal guardian agreeable RTC with Dr Irene Limbo with labs in 6 months  All of the patients questions were answered with apparent satisfaction. The patient knows to call the clinic with any problems, questions or concerns.  The total time spent in the appointment was  20 minutes and more than 50% was on counseling and direct patient cares.     Sullivan Lone MD [place] AAHIVMS [facility] Hematology/Oncology Physician [facility]  (Office):       [phone] (Work cell):  [phone] (Fax):           [phone]  08/01/2020 9:53 AM  I, Reinaldo Raddle, am acting as scribe for Dr. Sullivan Lone, MD. .I have reviewed the above documentation for accuracy and completeness, and I agree with the above. Brunetta Genera MD

## 2020-08-01 ENCOUNTER — Inpatient Hospital Stay: Payer: Medicare Other | Attending: Hematology | Admitting: Hematology

## 2020-08-01 ENCOUNTER — Other Ambulatory Visit: Payer: Self-pay

## 2020-08-01 VITALS — BP 140/59 | HR 58 | Temp 97.8°F | Resp 19 | Ht 64.0 in | Wt 252.0 lb

## 2020-08-01 DIAGNOSIS — D3A090 Benign carcinoid tumor of the bronchus and lung: Secondary | ICD-10-CM | POA: Diagnosis not present

## 2020-08-01 DIAGNOSIS — E1122 Type 2 diabetes mellitus with diabetic chronic kidney disease: Secondary | ICD-10-CM | POA: Insufficient documentation

## 2020-08-01 DIAGNOSIS — D649 Anemia, unspecified: Secondary | ICD-10-CM | POA: Insufficient documentation

## 2020-08-01 DIAGNOSIS — I129 Hypertensive chronic kidney disease with stage 1 through stage 4 chronic kidney disease, or unspecified chronic kidney disease: Secondary | ICD-10-CM | POA: Insufficient documentation

## 2020-08-01 DIAGNOSIS — C7A090 Malignant carcinoid tumor of the bronchus and lung: Secondary | ICD-10-CM | POA: Insufficient documentation

## 2020-08-01 DIAGNOSIS — N183 Chronic kidney disease, stage 3 unspecified: Secondary | ICD-10-CM | POA: Insufficient documentation

## 2020-08-02 ENCOUNTER — Telehealth: Payer: Self-pay | Admitting: Hematology

## 2020-08-02 NOTE — Telephone Encounter (Signed)
Scheduled follow-up appointment per 5/5 los. Patient's legal guardian is aware.

## 2020-08-06 DIAGNOSIS — U071 COVID-19: Secondary | ICD-10-CM | POA: Diagnosis not present

## 2020-08-06 DIAGNOSIS — Z20828 Contact with and (suspected) exposure to other viral communicable diseases: Secondary | ICD-10-CM | POA: Diagnosis not present

## 2020-08-08 ENCOUNTER — Telehealth: Payer: Self-pay

## 2020-08-08 DIAGNOSIS — R809 Proteinuria, unspecified: Secondary | ICD-10-CM | POA: Diagnosis not present

## 2020-08-08 DIAGNOSIS — E211 Secondary hyperparathyroidism, not elsewhere classified: Secondary | ICD-10-CM | POA: Diagnosis not present

## 2020-08-08 DIAGNOSIS — D631 Anemia in chronic kidney disease: Secondary | ICD-10-CM | POA: Diagnosis not present

## 2020-08-08 DIAGNOSIS — E1122 Type 2 diabetes mellitus with diabetic chronic kidney disease: Secondary | ICD-10-CM | POA: Diagnosis not present

## 2020-08-08 DIAGNOSIS — I5032 Chronic diastolic (congestive) heart failure: Secondary | ICD-10-CM | POA: Diagnosis not present

## 2020-08-08 DIAGNOSIS — N189 Chronic kidney disease, unspecified: Secondary | ICD-10-CM | POA: Diagnosis not present

## 2020-08-08 DIAGNOSIS — E1129 Type 2 diabetes mellitus with other diabetic kidney complication: Secondary | ICD-10-CM | POA: Diagnosis not present

## 2020-08-08 NOTE — Telephone Encounter (Signed)
Spoke to pt's legal guardian [facility]/ [employer]. She said they are aware of the plan to see if pt is a candidate for radiation and give their okay for treatment. Dr Irene Limbo made aware and he is going to make the referral. Scheduling will contact Nursing Home with appointment information.

## 2020-08-08 NOTE — Telephone Encounter (Signed)
Returned call to [employer]. At [place]. Left voice mail. Pt is currently ina Nursing Home and has a legal guardian. Per Dr. Irene Limbo: this patient has a bronchopulmonary carcinoid--no acute change on recent imaging but it is compressing the airway and causing some atelectasis and I wanted to refer her to radiation oncology to consider SBRT. She apparently has a legal guardian who we will need to call before I can place a referral. Her accompanying attendant did not have this information.. Please see if we can get a consent from the legal guardian about referring her to radiation oncology for an evaluation thank you.   Contacting Rip Harbour as she is aware of this and can help [place] get this going. Will attempt to call again.  [name] [phone] ext: [phone]

## 2020-08-13 DIAGNOSIS — Z20828 Contact with and (suspected) exposure to other viral communicable diseases: Secondary | ICD-10-CM | POA: Diagnosis not present

## 2020-08-13 DIAGNOSIS — U071 COVID-19: Secondary | ICD-10-CM | POA: Diagnosis not present

## 2020-08-15 DIAGNOSIS — E1122 Type 2 diabetes mellitus with diabetic chronic kidney disease: Secondary | ICD-10-CM | POA: Diagnosis not present

## 2020-08-15 DIAGNOSIS — I480 Paroxysmal atrial fibrillation: Secondary | ICD-10-CM | POA: Diagnosis not present

## 2020-08-15 DIAGNOSIS — J449 Chronic obstructive pulmonary disease, unspecified: Secondary | ICD-10-CM | POA: Diagnosis not present

## 2020-08-15 DIAGNOSIS — I5032 Chronic diastolic (congestive) heart failure: Secondary | ICD-10-CM | POA: Diagnosis not present

## 2020-08-20 ENCOUNTER — Other Ambulatory Visit: Payer: Self-pay

## 2020-08-20 ENCOUNTER — Ambulatory Visit (INDEPENDENT_AMBULATORY_CARE_PROVIDER_SITE_OTHER): Payer: Medicare Other | Admitting: Cardiology

## 2020-08-20 ENCOUNTER — Encounter: Payer: Self-pay | Admitting: Cardiology

## 2020-08-20 VITALS — BP 116/58 | HR 61 | Ht 63.0 in | Wt 256.4 lb

## 2020-08-20 DIAGNOSIS — I480 Paroxysmal atrial fibrillation: Secondary | ICD-10-CM

## 2020-08-20 DIAGNOSIS — D6869 Other thrombophilia: Secondary | ICD-10-CM | POA: Diagnosis not present

## 2020-08-20 DIAGNOSIS — Z20828 Contact with and (suspected) exposure to other viral communicable diseases: Secondary | ICD-10-CM | POA: Diagnosis not present

## 2020-08-20 DIAGNOSIS — U071 COVID-19: Secondary | ICD-10-CM | POA: Diagnosis not present

## 2020-08-20 DIAGNOSIS — I5032 Chronic diastolic (congestive) heart failure: Secondary | ICD-10-CM

## 2020-08-20 DIAGNOSIS — I10 Essential (primary) hypertension: Secondary | ICD-10-CM | POA: Diagnosis not present

## 2020-08-20 NOTE — Patient Instructions (Signed)

## 2020-08-20 NOTE — Progress Notes (Signed)
Cardiology Office Note  Date: 08/20/2020   ID: [name], DOB February 01, 1956, MRN [id number]  PCP:  Rosita Fire, MD  Cardiologist:  Rozann Lesches, MD Electrophysiologist:  None   Chief Complaint  Patient presents with  . Cardiac follow-up    History of Present Illness: [name] is a 65 y.o. female former patient of Dr. Bronson Ing now presenting to establish follow-up with me.  I reviewed her records and updated the chart.  She was last seen in November 2021 by Ms. Strader PA-C.  She continues to reside at [facility], here with an assistant today.  Other than an occasional feeling of lightheadedness, she does not report any progressive sense of palpitations, no chest pain, no frank syncope.  I reviewed her recent lab work as noted below.  Recent follow-up noted with Dr. Theador Hawthorne, I reviewed the note.  She has CKD stage IIIb, was started on losartan due to proteinuria, taken off hydralazine to avoid low blood pressure.  Today's blood pressure is low normal.  She does not report any spontaneous bleeding problems on Eliquis.  Past Medical History:  Diagnosis Date  . Arthritis   . Asthma   . Atrial fibrillation ([place])   . Carcinoid tumor determined by biopsy of lung 05/2018   Left lung  . Chronic diastolic CHF (congestive heart failure) ([place])   . CKD (chronic kidney disease), stage III ([place])   . Diabetes mellitus type 2 in obese ([place])   . Essential hypertension   . Iron deficiency anemia 10/16/2010  . Mental handicap 10/16/2010  . Mild CAD 2013  . Morbid obesity ([place])   . OSA (obstructive sleep apnea)     Past Surgical History:  Procedure Laterality Date  . ABDOMINAL HYSTERECTOMY    . AV FISTULA PLACEMENT Right 06/06/2018   Procedure: ARTERIOVENOUS (AV) FISTULA CREATION;  Surgeon: Marty Heck, MD;  Location: [facility];  Service: Vascular;  Laterality: Right;  . CESAREAN SECTION    . CHOLECYSTECTOMY    . COLONOSCOPY  08/2010   normal TI, sigmoid polyp  (adenoma ). Next TCS due  08/2015,  . COLONOSCOPY WITH PROPOFOL N/A 07/08/2020   Procedure: COLONOSCOPY WITH PROPOFOL;  Surgeon: Daneil Dolin, MD;  [facility];  Service: Endoscopy;  Laterality: N/A;  AM (diabetic and facility patient)  . ESOPHAGOGASTRODUODENOSCOPY  08/2010   antral and duodenal erosions s/p bx (chronic gastritis, no h.pylori, no celiac dz ), hiatal hernia  . FISTULA SUPERFICIALIZATION Right 08/25/2018   Procedure: FISTULA SUPERFICIALIZATION RIGHT ARM;  Surgeon: Waynetta Sandy, MD;  Location: [facility];  Service: Cardiovascular;  Laterality: Right;  . IR FLUORO GUIDE CV LINE RIGHT  05/31/2018  . IR US GUIDE VASC ACCESS RIGHT  05/31/2018  . KNEE SURGERY     right knee @ 65 years of age  . LEFT AND RIGHT HEART CATHETERIZATION WITH CORONARY ANGIOGRAM N/A 09/21/2011   Procedure: LEFT AND RIGHT HEART CATHETERIZATION WITH CORONARY ANGIOGRAM;  Surgeon: Birdie Riddle, MD;  Location: [facility];  Service: Cardiovascular;  Laterality: N/A;  . POLYPECTOMY  07/08/2020   Procedure: POLYPECTOMY INTESTINAL;  Surgeon: Daneil Dolin, MD;  [facility];  Service: Endoscopy;;    Current Outpatient Medications  Medication Sig Dispense Refill  . acetaminophen (TYLENOL) 325 MG tablet Take 325 mg by mouth every 12 (twelve) hours as needed for moderate pain.    Marland Kitchen albuterol (PROVENTIL HFA;VENTOLIN HFA) 108 (90 Base) MCG/ACT inhaler Inhale 2 puffs into  the lungs every 6 (six) hours as needed for shortness of breath.    Marland Kitchen apixaban (ELIQUIS) 5 MG TABS tablet Take 1 tablet (5 mg total) by mouth 2 (two) times daily. 60 tablet 0  . betamethasone dipropionate (DIPROLENE) 0.05 % ointment Apply 1 application topically daily as needed (for rash under breasts).     . calcitRIOL (ROCALTROL) 0.25 MCG capsule Take by mouth.    . cetirizine (ZYRTEC) 10 MG tablet Take 10 mg by mouth daily. (0800)    . DIABETIC TUSSIN EX 100 MG/5ML syrup Take 100 mg by mouth every 4 (four) hours as needed  for cough or congestion.     Marland Kitchen diltiazem (CARDIZEM CD) 240 MG 24 hr capsule Take 1 capsule (240 mg total) by mouth daily. (Patient taking differently: Take 240 mg by mouth daily. (0800)) 30 capsule 0  . fluticasone (FLONASE) 50 MCG/ACT nasal spray Place 2 sprays into both nostrils daily as needed for allergies or rhinitis.    . Fluticasone-Umeclidin-Vilant 100-62.5-25 MCG/INH AEPB Inhale 1 puff into the lungs daily. (0800)    . furosemide (LASIX) 40 MG tablet Take 40 mg by mouth 2 (two) times daily.    Marland Kitchen glucose blood (EASYMAX TEST) test strip USE TO CHECK BLOOD SUGAR 4 TIMES DAILY. 100 strip 2  . guaiFENesin (MUCINEX) 600 MG 12 hr tablet Take 1 tablet (600 mg total) by mouth 2 (two) times daily as needed. (Patient taking differently: Take 600 mg by mouth 2 (two) times daily as needed (cough/congestion).) 60 tablet 11  . insulin regular human CONCENTRATED (HUMULIN R U-500 KWIKPEN) 500 UNIT/ML kwikpen INJECT 40UNITS SUBCUTANEOUSLY WITH BREAKFAST, 30 UNITS WITH LUNCH, & 30 UNITS WITH SUPPER WHEN BLOOD SUGAR IS > 90MG /DL AND PATIENT IS EATING. (Patient taking differently: Inject 30-40 Units into the skin See admin instructions. INJECT 40UNITS SUBCUTANEOUSLY WITH BREAKFAST, 30 UNITS WITH LUNCH, & 30 UNITS WITH SUPPER WHEN BLOOD SUGAR IS > 90MG /DL AND PATIENT IS EATING.) 20 mL 0  . ipratropium-albuterol (DUONEB) 0.5-2.5 (3) MG/3ML SOLN Take 3 mLs by nebulization every 6 (six) hours as needed (for cough).     . isosorbide dinitrate (ISORDIL) 10 MG tablet Take 1 tablet (10 mg total) by mouth 3 (three) times daily. (Patient taking differently: Take 10 mg by mouth 3 (three) times daily. (0800, 1400 & 2000)) 90 tablet 0  . Lancets 28G MISC USE TO CHECK BLOOD SUGAR 4 TIMES DAILY. DX E11.22 200 each 5  . losartan (COZAAR) 25 MG tablet Take 25 mg by mouth daily.    . metolazone (ZAROXOLYN) 5 MG tablet Take 5 mg by mouth in the morning. (0800)    . metoprolol tartrate (LOPRESSOR) 50 MG tablet Take 50 mg by mouth 2  (two) times daily. (0800 & 2000)    . montelukast (SINGULAIR) 10 MG tablet Take 10 mg by mouth at bedtime. (2000)    . polyethylene glycol powder (GLYCOLAX/MIRALAX) 17 GM/SCOOP powder Take 17 g by mouth daily as needed for mild constipation or moderate constipation.    . potassium chloride SA (KLOR-CON) 20 MEQ tablet Take 20 mEq by mouth 3 (three) times daily. (0800, 1400 & 2000)    . rosuvastatin (CRESTOR) 20 MG tablet TAKE (1) TABLET BY MOUTH ONCE DAILY. (Patient taking differently: Take 20 mg by mouth daily at 8 pm. (2000)) 30 tablet 3  . Vitamin D, Cholecalciferol, 25 MCG (1000 UT) TABS Take 2,000 Units by mouth daily. (0800) 60 tablet 6  . clotrimazole (LOTRIMIN) 1 % cream Apply  1 application topically daily as needed (under breasts as needed for rash). (Patient not taking: Reported on 08/20/2020)    . triamcinolone cream (KENALOG) 0.5 % Apply 1 application topically every 12 (twelve) hours as needed (applied to bilateral legs for allergic dermatitis).  (Patient not taking: Reported on 08/20/2020)     No current facility-administered medications for this visit.   Allergies:  Patient has no known allergies.   ROS: No orthopnea or PND.  Physical Exam: VS:  BP (!) 116/58   Pulse 61   Ht 5\' 3"  (1.6 m)   Wt 256 lb 6.4 oz (116.3 kg)   SpO2 99%   BMI 45.42 kg/m , BMI Body mass index is 45.42 kg/m.  Wt Readings from Last 3 Encounters:  08/20/20 256 lb 6.4 oz (116.3 kg)  08/01/20 252 lb (114.3 kg)  07/24/20 254 lb (115.2 kg)    General: Patient appears comfortable at rest. HEENT: Conjunctiva and lids normal, wearing a mask. Neck: Supple, no elevated JVP or carotid bruits, no thyromegaly. Lungs: Clear to auscultation, nonlabored breathing at rest. Cardiac: Regular rate and rhythm with ectopy, no S3, 1/6 systolic murmur, no pericardial rub. Extremities: No pitting edema.  ECG:  An ECG dated 09/19/2019 was personally reviewed today and demonstrated:  Junctional rhythm.  Recent  Labwork: 09/19/2019: Magnesium 2.0 04/23/2020: TSH 2.300 06/04/2020: ALT 11; AST 13; BUN 49; Creatinine 2.09; Hemoglobin 9.8; Platelets 250; Potassium 3.6; Sodium 136     Component Value Date/Time   CHOL 127 05/07/2020 0000   CHOL 128 04/23/2020 0820   TRIG 150 05/07/2020 0000   HDL 42 05/07/2020 0000   HDL 43 04/23/2020 0820   CHOLHDL 3.0 04/23/2020 0820   CHOLHDL 5.5 (H) 09/12/2019 0831   VLDL 59 (H) 06/02/2018 0029   LDLCALC 62 05/07/2020 0000   LDLCALC 60 04/23/2020 0820   LDLCALC 124 (H) 09/12/2019 0831    Other Studies Reviewed Today:  TEE 05/26/2018: 1. Calcified, heterogeneous, primarily immobile lesion on the left  coronary cusp, appears to be primarily on the ventricular aspect of the  left coronary cusp. In clip 33 where it is well seen it measures 0.98 cm x  0.56 cm. The lesion has the appearance  of a subacute or healed vegetation. Differential also includes calcified  nodule in the setting of chronic dialysis.  2. The left ventricle has normal systolic function, with an ejection  fraction of 60-65%. The cavity size was normal.  3. The right ventricle has normal systolc function. The cavity was  normal. There is no increase in right ventricular wall thickness.  4. The aortic valve is tricuspid. Aortic valve regurgitation is trivial  by color flow Doppler.  5. The tricuspid valve was normal in structure.  6. The pulmonic valve was normal in structure.  7. There is evidence of severe atherosclerotic plaque in the aortic arch  and descending aorta.  8. Tiny patent foramen ovale with left to right shunting across the  atrial septum by color flow Doppler.  9. The interatrial septum appears to be lipomatous.  10. No left atrial appendage thrombus.   Assessment and Plan:  1.  Paroxysmal atrial fibrillation with CHA2DS2-VASc score of 3-4.  No progressive sense of palpitations.  Heart rate control has been adequate on combination of of Cardizem CD and Lopressor.   She does not report any spontaneous bleeding problems on Eliquis.  2.  Acquired thrombophilia, on Eliquis for stroke prophylaxis.  No spontaneous bleeding problems.  I reviewed her most  recent lab work.  3.  Chronic diastolic heart failure, weight has been relatively stable and she continues on Lasix with potassium supplement.  4.  Essential hypertension, she was taken off hydralazine recently with initiation of losartan by Dr. Theador Hawthorne as discussed above.  Continue to track blood pressure.  If she does develop any issues with symptomatic hypotension, losartan could be cut to 12.5 mg daily.  Medication Adjustments/Labs and Tests Ordered: Current medicines are reviewed at length with the patient today.  Concerns regarding medicines are outlined above.   Tests Ordered: No orders of the defined types were placed in this encounter.   Medication Changes: No orders of the defined types were placed in this encounter.   Disposition:  Follow up 6 months.  Signed, Satira Sark, MD, [facility] 08/20/2020 1:29 PM    [facility] at [facility] [address], [place], [place] [address] Phone: [phone]; Fax: [phone]

## 2020-08-22 DIAGNOSIS — E1122 Type 2 diabetes mellitus with diabetic chronic kidney disease: Secondary | ICD-10-CM | POA: Diagnosis not present

## 2020-08-22 DIAGNOSIS — N189 Chronic kidney disease, unspecified: Secondary | ICD-10-CM | POA: Diagnosis not present

## 2020-08-22 DIAGNOSIS — R809 Proteinuria, unspecified: Secondary | ICD-10-CM | POA: Diagnosis not present

## 2020-08-22 DIAGNOSIS — D631 Anemia in chronic kidney disease: Secondary | ICD-10-CM | POA: Diagnosis not present

## 2020-08-22 DIAGNOSIS — E1129 Type 2 diabetes mellitus with other diabetic kidney complication: Secondary | ICD-10-CM | POA: Diagnosis not present

## 2020-08-22 DIAGNOSIS — E211 Secondary hyperparathyroidism, not elsewhere classified: Secondary | ICD-10-CM | POA: Diagnosis not present

## 2020-08-22 DIAGNOSIS — I5032 Chronic diastolic (congestive) heart failure: Secondary | ICD-10-CM | POA: Diagnosis not present

## 2020-08-28 DIAGNOSIS — Z20828 Contact with and (suspected) exposure to other viral communicable diseases: Secondary | ICD-10-CM | POA: Diagnosis not present

## 2020-08-28 DIAGNOSIS — U071 COVID-19: Secondary | ICD-10-CM | POA: Diagnosis not present

## 2020-09-02 ENCOUNTER — Other Ambulatory Visit: Payer: Self-pay | Admitting: "Endocrinology

## 2020-09-03 ENCOUNTER — Other Ambulatory Visit: Payer: Self-pay | Admitting: "Endocrinology

## 2020-09-03 DIAGNOSIS — U071 COVID-19: Secondary | ICD-10-CM | POA: Diagnosis not present

## 2020-09-03 DIAGNOSIS — M79675 Pain in left toe(s): Secondary | ICD-10-CM | POA: Diagnosis not present

## 2020-09-03 DIAGNOSIS — B351 Tinea unguium: Secondary | ICD-10-CM | POA: Diagnosis not present

## 2020-09-03 DIAGNOSIS — Z20828 Contact with and (suspected) exposure to other viral communicable diseases: Secondary | ICD-10-CM | POA: Diagnosis not present

## 2020-09-03 DIAGNOSIS — M79674 Pain in right toe(s): Secondary | ICD-10-CM | POA: Diagnosis not present

## 2020-09-10 DIAGNOSIS — U071 COVID-19: Secondary | ICD-10-CM | POA: Diagnosis not present

## 2020-09-10 DIAGNOSIS — Z20828 Contact with and (suspected) exposure to other viral communicable diseases: Secondary | ICD-10-CM | POA: Diagnosis not present

## 2020-09-12 ENCOUNTER — Other Ambulatory Visit: Payer: Self-pay | Admitting: "Endocrinology

## 2020-09-15 DIAGNOSIS — E1165 Type 2 diabetes mellitus with hyperglycemia: Secondary | ICD-10-CM | POA: Diagnosis not present

## 2020-09-15 DIAGNOSIS — I110 Hypertensive heart disease with heart failure: Secondary | ICD-10-CM | POA: Diagnosis not present

## 2020-09-17 DIAGNOSIS — Z20828 Contact with and (suspected) exposure to other viral communicable diseases: Secondary | ICD-10-CM | POA: Diagnosis not present

## 2020-09-17 DIAGNOSIS — U071 COVID-19: Secondary | ICD-10-CM | POA: Diagnosis not present

## 2020-09-24 DIAGNOSIS — Z20828 Contact with and (suspected) exposure to other viral communicable diseases: Secondary | ICD-10-CM | POA: Diagnosis not present

## 2020-09-24 DIAGNOSIS — U071 COVID-19: Secondary | ICD-10-CM | POA: Diagnosis not present

## 2020-09-27 ENCOUNTER — Other Ambulatory Visit: Payer: Self-pay | Admitting: Hematology

## 2020-09-27 DIAGNOSIS — D3A090 Benign carcinoid tumor of the bronchus and lung: Secondary | ICD-10-CM

## 2020-10-01 DIAGNOSIS — U071 COVID-19: Secondary | ICD-10-CM | POA: Diagnosis not present

## 2020-10-01 DIAGNOSIS — Z20828 Contact with and (suspected) exposure to other viral communicable diseases: Secondary | ICD-10-CM | POA: Diagnosis not present

## 2020-10-08 DIAGNOSIS — Z20828 Contact with and (suspected) exposure to other viral communicable diseases: Secondary | ICD-10-CM | POA: Diagnosis not present

## 2020-10-08 DIAGNOSIS — U071 COVID-19: Secondary | ICD-10-CM | POA: Diagnosis not present

## 2020-10-10 DIAGNOSIS — E211 Secondary hyperparathyroidism, not elsewhere classified: Secondary | ICD-10-CM | POA: Diagnosis not present

## 2020-10-10 DIAGNOSIS — N189 Chronic kidney disease, unspecified: Secondary | ICD-10-CM | POA: Diagnosis not present

## 2020-10-10 DIAGNOSIS — I5032 Chronic diastolic (congestive) heart failure: Secondary | ICD-10-CM | POA: Diagnosis not present

## 2020-10-10 DIAGNOSIS — R809 Proteinuria, unspecified: Secondary | ICD-10-CM | POA: Diagnosis not present

## 2020-10-10 DIAGNOSIS — E1122 Type 2 diabetes mellitus with diabetic chronic kidney disease: Secondary | ICD-10-CM | POA: Diagnosis not present

## 2020-10-10 DIAGNOSIS — E1129 Type 2 diabetes mellitus with other diabetic kidney complication: Secondary | ICD-10-CM | POA: Diagnosis not present

## 2020-10-10 DIAGNOSIS — D631 Anemia in chronic kidney disease: Secondary | ICD-10-CM | POA: Diagnosis not present

## 2020-10-11 NOTE — Progress Notes (Signed)
Location of tumor and Histology per Pathology Report: LLL pulmonary nodule  Biopsy: 05/26/2018   Past/Anticipated interventions by surgeon, if any: surgery is not an option due to location of nodule  Past/Anticipated interventions by medical oncology, if any: Chemotherapy not at this time. Referred to radiation oncology.    Pain issues, if any:  no   SAFETY ISSUES: Prior radiation? no Pacemaker/ICD? no Possible current pregnancy? no Is the patient on methotrexate? no  Current Complaints / other details:  none     Vitals:   10/16/20 1352  BP: 122/64  Pulse: 63  Resp: 18  Temp: (!) 97.2 F (36.2 C)  SpO2: 100%  Weight: 256 lb 9.6 oz (116.4 kg)

## 2020-10-15 DIAGNOSIS — E1122 Type 2 diabetes mellitus with diabetic chronic kidney disease: Secondary | ICD-10-CM | POA: Diagnosis not present

## 2020-10-15 DIAGNOSIS — Z20828 Contact with and (suspected) exposure to other viral communicable diseases: Secondary | ICD-10-CM | POA: Diagnosis not present

## 2020-10-15 DIAGNOSIS — J449 Chronic obstructive pulmonary disease, unspecified: Secondary | ICD-10-CM | POA: Diagnosis not present

## 2020-10-15 DIAGNOSIS — U071 COVID-19: Secondary | ICD-10-CM | POA: Diagnosis not present

## 2020-10-15 NOTE — Progress Notes (Signed)
Radiation Oncology         [phone] ________________________________  Initial Outpatient Consultation  Name: FRANK PILGER MRN: [id number]  Date: 10/16/2020  DOB: 07/08/1955  PP:IRJJO, Brandon Melnick, MD  Brunetta Genera, MD   REFERRING PHYSICIAN: Brunetta Genera, MD  DIAGNOSIS: The primary encounter diagnosis was Malignant neoplasm of left lung, unspecified part of lung ([place]). A diagnosis of Carcinoid tumor determined by biopsy of lung was also pertinent to this visit.  Bronchopulmonary Carcinoid  HISTORY OF PRESENT ILLNESS::Bree A Huebert is a 65 y.o. female with mental disability who resides in assisted living center in [place] who is accompanied by her legal guardian Case Cecille Rubin and caregiver from the assisted living center. she is seen as a courtesy of Dr. Irene Limbo for an opinion concerning radiation therapy as part of management for her diagnosed typical carcinoid. The patient initially presented to the ED in February of 2020 with community acquired pneumonia; during which time the patient received a CT of the chest on 05/13/2018 which showed a mass-like consolidation measuring 5 x 8.2 cm over the left lower lobe. During this hospital stay, the patient underwent TEE and bronchoscopy on 05/26/2018; results from the endobronchial biopsy performed showed: typical carcinoid tumor and atypical squamous metaplasia, favored to be reactive.  Patient underwent bronchoscopy with Dr. Valeta Harms.  There was near-total occlusion of the lateral and posterior segment of the left lower lobe with visible endobronchial tumor present at that time.  Biopsy of this area revealed low-grade (typical carcinoid).  The patient was also noted to have tumor along the anterior medial wall of the left mainstem which was apparently sampled but not present in the path report. since then, the patients condition has been noted to be slow growing and benign; she has been following with Dr. Irene Limbo for  follow-up imaging since.  Pertinent imaging includes:  - PET scan- skull base to thigh on 12/22/2018 which showed the solitary well differentiated neuroendocrine tumor within the left infrahilar lung located at the distal aspect of the left mainstem bronchus. Otherwise, this imaging showed no evidence of additional metastasis. -Chest x-ray on 09/19/2019 showed chronic appearing increased lung markings without evidence of acute or active cardiopulmonary disease. - Bilateral mammogram on 03/07/2020 which showed no evidence of malignancy -X-ray on 05/07/20 which revealed no parenchymal lung nodules -Chest CT on 07/23/20 revealed the previously seen nodule in the left lower lung to show no significant change over two years; still measuring 1.5 cm. The nodule appeared at this time to be pushing against and narrowing one of the lower lobe airways (although the patient was reported to be not showing any symptoms of this on 08/01/20).    Of note: the patient underwent colonoscopy on 07/08/20 for high risk colon cancer surveillance due to her history of colonic polyps.  The patient most recently followed up with Dr. Irene Limbo on 08/01/20 in which the patient was noted to be doing well overall with no new symptoms or concerns. Of note: the patient has a mental disability and lives in a assisted living facility. During her visits with Dr. Irene Limbo she is typically with a helper from her facility.   She has been complaining of some coughing but is unclear if this is related to her lung lesion or sinus issues.  Most recent chest CT scan did show some narrowing of the left lower lobe airways and some atelectasis of the left lower lung consistent with progression.  she is referred to radiation oncology for  consideration for potential treatment   PREVIOUS RADIATION THERAPY: No  PAST MEDICAL HISTORY:  Past Medical History:  Diagnosis Date   Arthritis    Asthma    Atrial fibrillation ([place])    Carcinoid tumor determined  by biopsy of lung 05/2018   Left lung   Chronic diastolic CHF (congestive heart failure) ([place])    CKD (chronic kidney disease), stage III ([place])    Diabetes mellitus type 2 in obese [facility])    Essential hypertension    Iron deficiency anemia 10/16/2010   Mental handicap 10/16/2010   Mild CAD 2013   Morbid obesity ([facility])    OSA (obstructive sleep apnea)     PAST SURGICAL HISTORY: Past Surgical History:  Procedure Laterality Date   ABDOMINAL HYSTERECTOMY     AV FISTULA PLACEMENT Right 06/06/2018   Procedure: ARTERIOVENOUS (AV) FISTULA CREATION;  Surgeon: Marty Heck, MD;  Location: [facility] OR;  Service: Vascular;  Laterality: Right;   CESAREAN SECTION     CHOLECYSTECTOMY     COLONOSCOPY  08/2010   normal TI, sigmoid polyp (adenoma ). Next TCS due  08/2015,   COLONOSCOPY WITH PROPOFOL N/A 07/08/2020   Procedure: COLONOSCOPY WITH PROPOFOL;  Surgeon: Daneil Dolin, MD;  Location: [facility];  Service: Endoscopy;  Laterality: N/A;  AM (diabetic and facility patient)   ESOPHAGOGASTRODUODENOSCOPY  08/2010   antral and duodenal erosions s/p bx (chronic gastritis, no h.pylori, no celiac dz ), hiatal hernia   FISTULA SUPERFICIALIZATION Right 08/25/2018   Procedure: FISTULA SUPERFICIALIZATION RIGHT ARM;  Surgeon: Waynetta Sandy, MD;  Location: [place];  Service: Cardiovascular;  Laterality: Right;   IR FLUORO GUIDE CV LINE RIGHT  05/31/2018   IR US GUIDE VASC ACCESS RIGHT  05/31/2018   KNEE SURGERY     right knee @ 65 years of age   LEFT AND RIGHT HEART CATHETERIZATION WITH CORONARY ANGIOGRAM N/A 09/21/2011   Procedure: LEFT AND RIGHT HEART CATHETERIZATION WITH CORONARY ANGIOGRAM;  Surgeon: Birdie Riddle, MD;  Location: [facility] CATH LAB;  Service: Cardiovascular;  Laterality: N/A;   POLYPECTOMY  07/08/2020   Procedure: POLYPECTOMY INTESTINAL;  Surgeon: Daneil Dolin, MD;  [facility];  Service: Endoscopy;;    FAMILY HISTORY:  Family History  Problem Relation Age of Onset    Diabetes Mother    Hypertension Mother    Heart failure Mother    Hypertension Father    Atrial fibrillation Father    Kidney disease Other        son reportedly has had cyst on kidney and lung s/p surgery at [facility]   Hypertension Son    Colon cancer Neg Hx     SOCIAL HISTORY:  Social History   Tobacco Use   Smoking status: Former    Packs/day: 1.00    Years: 0.00    Pack years: 0.00    Types: Cigarettes    Quit date: 04/11/1996    Years since quitting: 24.5   Smokeless tobacco: Never   Tobacco comments:    quit a couple year ago  Vaping Use   Vaping Use: Never used  Substance Use Topics   Alcohol use: No   Drug use: No    ALLERGIES: No Known Allergies  MEDICATIONS:  Current Outpatient Medications  Medication Sig Dispense Refill   acetaminophen (TYLENOL) 325 MG tablet Take 325 mg by mouth every 12 (twelve) hours as needed for moderate pain.     albuterol (PROVENTIL HFA;VENTOLIN HFA) 108 (90 Base) MCG/ACT  inhaler Inhale 2 puffs into the lungs every 6 (six) hours as needed for shortness of breath.     apixaban (ELIQUIS) 5 MG TABS tablet Take 1 tablet (5 mg total) by mouth 2 (two) times daily. 60 tablet 0   betamethasone dipropionate (DIPROLENE) 0.05 % ointment Apply 1 application topically daily as needed (for rash under breasts).      calcitRIOL (ROCALTROL) 0.25 MCG capsule Take by mouth.     cetirizine (ZYRTEC) 10 MG tablet Take 10 mg by mouth daily. (0800)     clotrimazole (LOTRIMIN) 1 % cream Apply 1 application topically daily as needed (under breasts as needed for rash).     DIABETIC TUSSIN EX 100 MG/5ML syrup Take 100 mg by mouth every 4 (four) hours as needed for cough or congestion.      diltiazem (CARDIZEM CD) 240 MG 24 hr capsule Take 1 capsule (240 mg total) by mouth daily. (Patient taking differently: Take 240 mg by mouth daily. (0800)) 30 capsule 0   DROPSAFE SAFETY PEN NEEDLES 31G X 6 MM MISC      fluticasone (FLONASE) 50 MCG/ACT nasal spray Place 2 sprays  into both nostrils daily as needed for allergies or rhinitis.     Fluticasone-Umeclidin-Vilant 100-62.5-25 MCG/INH AEPB Inhale 1 puff into the lungs daily. (0800)     furosemide (LASIX) 40 MG tablet Take 40 mg by mouth 2 (two) times daily.     glucose blood (EASYMAX TEST) test strip CHECK BLOOD SUGAR 4 TIMES A DAY. (BREAKFAST, LUNCH, SUPPER & BEDTIME) 100 strip 5   guaiFENesin (MUCINEX) 600 MG 12 hr tablet Take 1 tablet (600 mg total) by mouth 2 (two) times daily as needed. (Patient taking differently: Take 600 mg by mouth 2 (two) times daily as needed (cough/congestion).) 60 tablet 11   insulin regular human CONCENTRATED (HUMULIN R U-500 KWIKPEN) 500 UNIT/ML kwikpen Inject 30-40 Units into the skin See admin instructions. INJECT 40UNITS SUBCUTANEOUSLY WITH BREAKFAST, 30 UNITS WITH LUNCH, & 30 UNITS WITH SUPPER WHEN BLOOD SUGAR IS > 90MG /DL AND PATIENT IS EATING. 7 mL 1   ipratropium-albuterol (DUONEB) 0.5-2.5 (3) MG/3ML SOLN Take 3 mLs by nebulization every 6 (six) hours as needed (for cough).      isosorbide dinitrate (ISORDIL) 10 MG tablet Take 1 tablet (10 mg total) by mouth 3 (three) times daily. (Patient taking differently: Take 10 mg by mouth 3 (three) times daily. (0800, 1400 & 2000)) 90 tablet 0   Lancets 28G MISC CHECK BLOOD SUGAR 4 TIMES A DAY. (BREAKFAST, LUNCH, SUPPER, & BEDTIME) 100 each 5   losartan (COZAAR) 25 MG tablet Take 25 mg by mouth daily.     metolazone (ZAROXOLYN) 5 MG tablet Take 5 mg by mouth in the morning. (0800)     metoprolol tartrate (LOPRESSOR) 50 MG tablet Take 50 mg by mouth 2 (two) times daily. (0800 & 2000)     montelukast (SINGULAIR) 10 MG tablet Take 10 mg by mouth at bedtime. (2000)     polyethylene glycol powder (GLYCOLAX/MIRALAX) 17 GM/SCOOP powder Take 17 g by mouth daily as needed for mild constipation or moderate constipation.     potassium chloride SA (KLOR-CON) 20 MEQ tablet Take 20 mEq by mouth 3 (three) times daily. (0800, 1400 & 2000)     rosuvastatin  (CRESTOR) 20 MG tablet TAKE (1) TABLET BY MOUTH ONCE DAILY. (Patient taking differently: Take 20 mg by mouth daily at 8 pm. (2000)) 30 tablet 3   triamcinolone cream (KENALOG) 0.5 % Apply  1 application topically every 12 (twelve) hours as needed (applied to bilateral legs for allergic dermatitis).     Vitamin D, Cholecalciferol, 25 MCG (1000 UT) TABS Take 2,000 Units by mouth daily. (0800) 60 tablet 6   No current facility-administered medications for this encounter.    REVIEW OF SYSTEMS:  A 10+ POINT REVIEW OF SYSTEMS WAS OBTAINED including neurology, dermatology, psychiatry, cardiac, respiratory, lymph, extremities, GI, GU, musculoskeletal, constitutional, reproductive, HEENT.  She denies any pain within the chest area, breathing problems or hemoptysis.   PHYSICAL EXAM:  weight is 256 lb 9.6 oz (116.4 kg). Her temperature is 97.2 F (36.2 C) (abnormal). Her blood pressure is 122/64 and her pulse is 63. Her respiration is 18 and oxygen saturation is 100%.   General: Alert and oriented, in no acute distress, quite pleasant and responds appropriately to questions.  Remains in wheelchair for evaluation HEENT: Head is normocephalic. Extraocular movements are intact.  Neck: Neck is supple, no palpable cervical or supraclavicular lymphadenopathy. Heart: Regular in rate and rhythm with no murmurs, rubs, or gallops. Chest: Clear to auscultation bilaterally, with no rhonchi, wheezes, or rales.  Careful examination of the left lung reveals no wheezing. Abdomen: Soft, nontender, nondistended, with no rigidity or guarding. Extremities: No cyanosis or edema. Lymphatics: see Neck Exam Skin: No concerning lesions. Musculoskeletal: symmetric strength and muscle tone throughout. Neurologic: Cranial nerves II through XII are grossly intact. No obvious focalities. Speech is fluent. Coordination is intact. Psychiatric: Judgment and insight are intact. Affect is appropriate.   ECOG = 2  0 - Asymptomatic  (Fully active, able to carry on all predisease activities without restriction)  1 - Symptomatic but completely ambulatory (Restricted in physically strenuous activity but ambulatory and able to carry out work of a light or sedentary nature. For example, light housework, office work)  2 - Symptomatic, <50% in bed during the day (Ambulatory and capable of all self care but unable to carry out any work activities. Up and about more than 50% of waking hours)  3 - Symptomatic, >50% in bed, but not bedbound (Capable of only limited self-care, confined to bed or chair 50% or more of waking hours)  4 - Bedbound (Completely disabled. Cannot carry on any self-care. Totally confined to bed or chair)  5 - Death   Eustace Pen MM, Creech RH, Tormey DC, et al. [id number]). "Toxicity and response criteria of the [facility]". [place] Oncol. 5 (6): 649-55  LABORATORY DATA:  Lab Results  Component Value Date   WBC 8.2 06/04/2020   HGB 9.8 (L) 06/04/2020   HCT 31.6 (L) 06/04/2020   MCV 86.3 06/04/2020   PLT 250 06/04/2020   NEUTROABS 5.9 06/04/2020   Lab Results  Component Value Date   NA 136 06/04/2020   K 3.6 06/04/2020   CL 96 (L) 06/04/2020   CO2 32 06/04/2020   GLUCOSE 261 (H) 06/04/2020   CREATININE 2.09 (H) 06/04/2020   CALCIUM 9.2 06/04/2020      RADIOGRAPHY: No results found.    IMPRESSION: Bronchopulmonary Carcinoid  At The time of her initial evaluation she was found to have an endobronchial tumor in the left lower lobe airways.  This was biopsied and returned typical carcinoid.  Most recent chest CT scan shows possible progression with some narrowing of the left lower lobe bronchi as well as some atelectasis of the left lower lobe.  Based on the location of the lesion the patient would not be a candidate for surgical  removal of this area.  Unsure if patient may be a potential candidate for laser therapy to decrease the endobronchial tumor and to improve airflow into  the left lower lobe hopefully to avoid potential complete collapse in the future.  Would like to present patient's case at the weekly thoracic oncology conference for input concerning potential management options for this patient.  If she is not a candidate for endobronchial treatment as above, then would consider radiation therapy.  In reviewing the literature concerning treatment of carcinoids with radiation therapy given the slow growth potential radiation results are limited.  More recent studies suggest more benefit from external beam radiation therapy at high-dose per fraction such as SBRT or hypofractionated treatments.  Based on the location of her lesion  she would not be a candidate for conventional SBRT but may be a candidate for high-dose per fraction almost SBRT treatments extending over approximately 5-10 sessions.  It would appear that typical carcinoid's respond better to ablative doses of radiation therapy which is present with SBRT.  Today, I talked to the patient and legal guardian about the findings and work-up thus far.  We discussed the natural history of typical carcinoid tumors of the lung and general treatment, highlighting the role of radiotherapy in the management.  We discussed the available radiation techniques, and focused on the details of logistics and delivery.  We reviewed the anticipated acute and late sequelae associated with radiation in this setting.  The patient was encouraged to ask questions that I answered to the best of my ability.  The patient and legal guardian are interested in proceeding with radiation therapy if endobronchial therapy as above is not a possibility.  PLAN: Presentation at the multidisciplinary thoracic oncology conference [facility]).  Final treatment management depends on results of this presentation.   60 minutes of total time was spent for this patient encounter, including preparation, face-to-face counseling with the patient and coordination of  care, physical exam, and documentation of the encounter.   ------------------------------------------------  Blair Promise, PhD, MD  This document serves as a record of services personally performed by Gery Pray, MD. It was created on his behalf by Roney Mans, a trained medical scribe. The creation of this record is based on the scribe's personal observations and the provider's statements to them. This document has been checked and approved by the attending provider.

## 2020-10-16 ENCOUNTER — Ambulatory Visit
Admission: RE | Admit: 2020-10-16 | Discharge: 2020-10-16 | Disposition: A | Discharge status: Home / Self Care | Payer: Medicare Other | Source: Ambulatory Visit | Attending: Radiation Oncology | Admitting: Radiation Oncology

## 2020-10-16 ENCOUNTER — Other Ambulatory Visit: Payer: Self-pay

## 2020-10-16 ENCOUNTER — Telehealth: Payer: Self-pay

## 2020-10-16 ENCOUNTER — Encounter: Payer: Self-pay | Admitting: Radiation Oncology

## 2020-10-16 VITALS — BP 122/64 | HR 63 | Temp 97.2°F | Resp 18 | Wt 256.6 lb

## 2020-10-16 DIAGNOSIS — Z7984 Long term (current) use of oral hypoglycemic drugs: Secondary | ICD-10-CM | POA: Diagnosis not present

## 2020-10-16 DIAGNOSIS — C7A090 Malignant carcinoid tumor of the bronchus and lung: Secondary | ICD-10-CM | POA: Diagnosis not present

## 2020-10-16 DIAGNOSIS — I2510 Atherosclerotic heart disease of native coronary artery without angina pectoris: Secondary | ICD-10-CM | POA: Insufficient documentation

## 2020-10-16 DIAGNOSIS — I4891 Unspecified atrial fibrillation: Secondary | ICD-10-CM | POA: Diagnosis not present

## 2020-10-16 DIAGNOSIS — C3492 Malignant neoplasm of unspecified part of left bronchus or lung: Secondary | ICD-10-CM

## 2020-10-16 DIAGNOSIS — N183 Chronic kidney disease, stage 3 unspecified: Secondary | ICD-10-CM | POA: Diagnosis not present

## 2020-10-16 DIAGNOSIS — Z79899 Other long term (current) drug therapy: Secondary | ICD-10-CM | POA: Diagnosis not present

## 2020-10-16 DIAGNOSIS — D3A090 Benign carcinoid tumor of the bronchus and lung: Secondary | ICD-10-CM

## 2020-10-16 DIAGNOSIS — G4733 Obstructive sleep apnea (adult) (pediatric): Secondary | ICD-10-CM | POA: Diagnosis not present

## 2020-10-16 DIAGNOSIS — I130 Hypertensive heart and chronic kidney disease with heart failure and stage 1 through stage 4 chronic kidney disease, or unspecified chronic kidney disease: Secondary | ICD-10-CM | POA: Insufficient documentation

## 2020-10-16 DIAGNOSIS — H25813 Combined forms of age-related cataract, bilateral: Secondary | ICD-10-CM | POA: Diagnosis not present

## 2020-10-16 DIAGNOSIS — E1122 Type 2 diabetes mellitus with diabetic chronic kidney disease: Secondary | ICD-10-CM | POA: Diagnosis not present

## 2020-10-16 DIAGNOSIS — J45909 Unspecified asthma, uncomplicated: Secondary | ICD-10-CM | POA: Insufficient documentation

## 2020-10-16 DIAGNOSIS — E119 Type 2 diabetes mellitus without complications: Secondary | ICD-10-CM | POA: Diagnosis not present

## 2020-10-16 DIAGNOSIS — Z794 Long term (current) use of insulin: Secondary | ICD-10-CM | POA: Diagnosis not present

## 2020-10-16 DIAGNOSIS — I5032 Chronic diastolic (congestive) heart failure: Secondary | ICD-10-CM | POA: Insufficient documentation

## 2020-10-16 DIAGNOSIS — Z86010 Personal history of colonic polyps: Secondary | ICD-10-CM | POA: Diagnosis not present

## 2020-10-16 NOTE — Progress Notes (Signed)
See MD note for nursing evaluation. °

## 2020-10-16 NOTE — Telephone Encounter (Signed)
[name] with [employer] left a VM that she received a referral on her but did not receive contact information. I tried to reach [place] back but was unable to get through. [name] left a phone number of [phone]

## 2020-10-16 NOTE — Telephone Encounter (Signed)
Tried to call Caryl Pina but was unable to get through on the contact number listed.

## 2020-10-17 DIAGNOSIS — D381 Neoplasm of uncertain behavior of trachea, bronchus and lung: Secondary | ICD-10-CM | POA: Diagnosis not present

## 2020-10-17 DIAGNOSIS — R809 Proteinuria, unspecified: Secondary | ICD-10-CM | POA: Diagnosis not present

## 2020-10-17 DIAGNOSIS — D631 Anemia in chronic kidney disease: Secondary | ICD-10-CM | POA: Diagnosis not present

## 2020-10-17 DIAGNOSIS — E1122 Type 2 diabetes mellitus with diabetic chronic kidney disease: Secondary | ICD-10-CM | POA: Diagnosis not present

## 2020-10-17 DIAGNOSIS — I5032 Chronic diastolic (congestive) heart failure: Secondary | ICD-10-CM | POA: Diagnosis not present

## 2020-10-17 DIAGNOSIS — E1129 Type 2 diabetes mellitus with other diabetic kidney complication: Secondary | ICD-10-CM | POA: Diagnosis not present

## 2020-10-17 DIAGNOSIS — N189 Chronic kidney disease, unspecified: Secondary | ICD-10-CM | POA: Diagnosis not present

## 2020-10-21 ENCOUNTER — Telehealth: Payer: Self-pay

## 2020-10-21 DIAGNOSIS — E1122 Type 2 diabetes mellitus with diabetic chronic kidney disease: Secondary | ICD-10-CM | POA: Diagnosis not present

## 2020-10-21 DIAGNOSIS — I110 Hypertensive heart disease with heart failure: Secondary | ICD-10-CM | POA: Diagnosis not present

## 2020-10-21 NOTE — Telephone Encounter (Signed)
-----   Message from Garner Nash, DO sent at 10/20/2020  9:23 AM EDT ----- Regarding: Needs Appt - nodule slot Team,   Please schedule appt with me, please use 12min nodule slot next available is fine.   (I requested to add clinic on August 8th if that gets approved we can put her there). If not we will need to do Aug 19th.   Thanks  Garner Nash, DO [facility] 10/20/2020 9:24 AM

## 2020-10-21 NOTE — Telephone Encounter (Signed)
Appointment scheduled for 8/19 with facility.

## 2020-10-22 DIAGNOSIS — Z20828 Contact with and (suspected) exposure to other viral communicable diseases: Secondary | ICD-10-CM | POA: Diagnosis not present

## 2020-10-22 DIAGNOSIS — U071 COVID-19: Secondary | ICD-10-CM | POA: Diagnosis not present

## 2020-10-24 IMAGING — DX DG CHEST 2V
2 series · 2 of 2 positions shown · non-contrast
Comparison: Radiographs 09/03/2017.  CT 08/10/2017.

CLINICAL DATA: Cough with vomiting, diarrhea for 3 weeks.

EXAM:
CHEST - 2 VIEW

[chest lat]
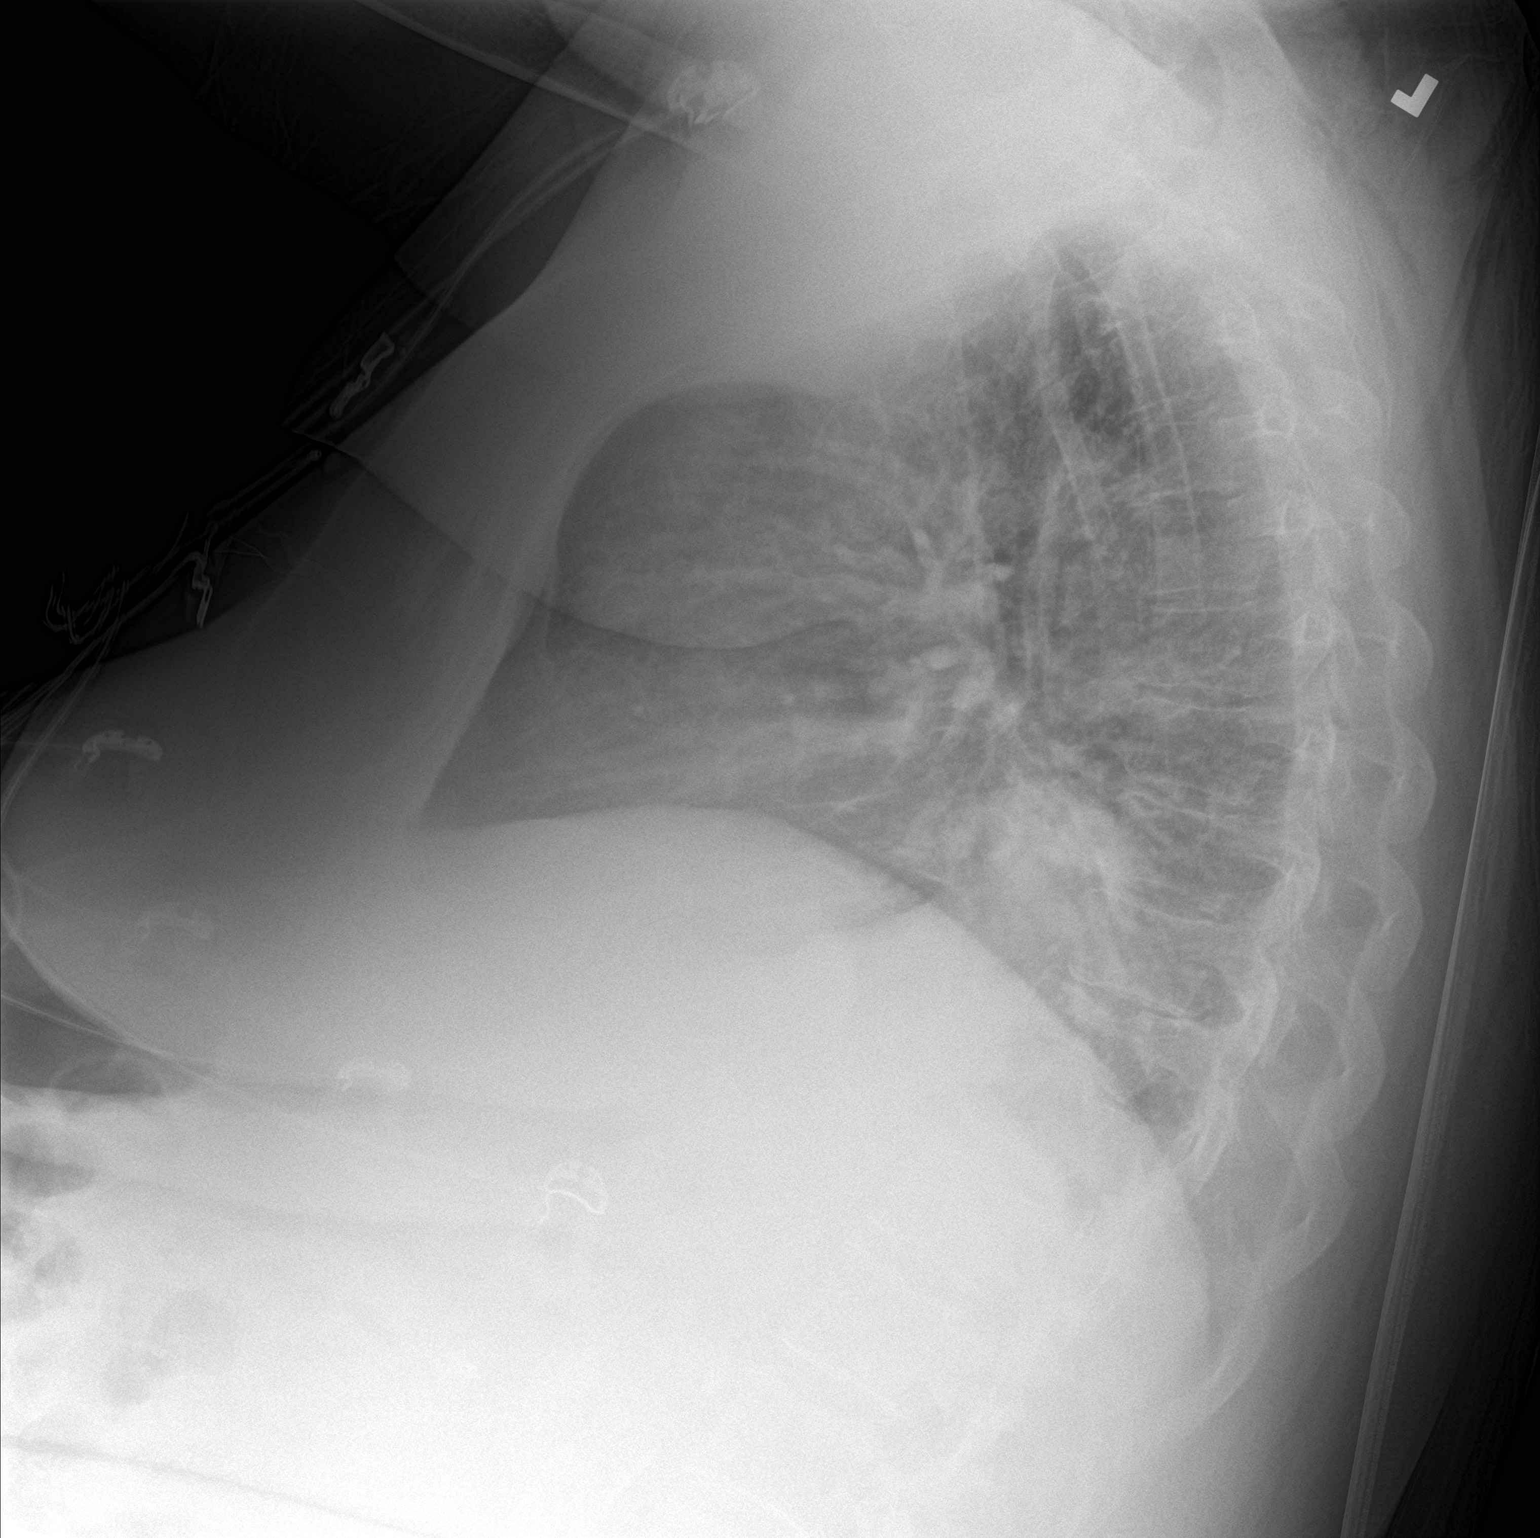

[chest ap]
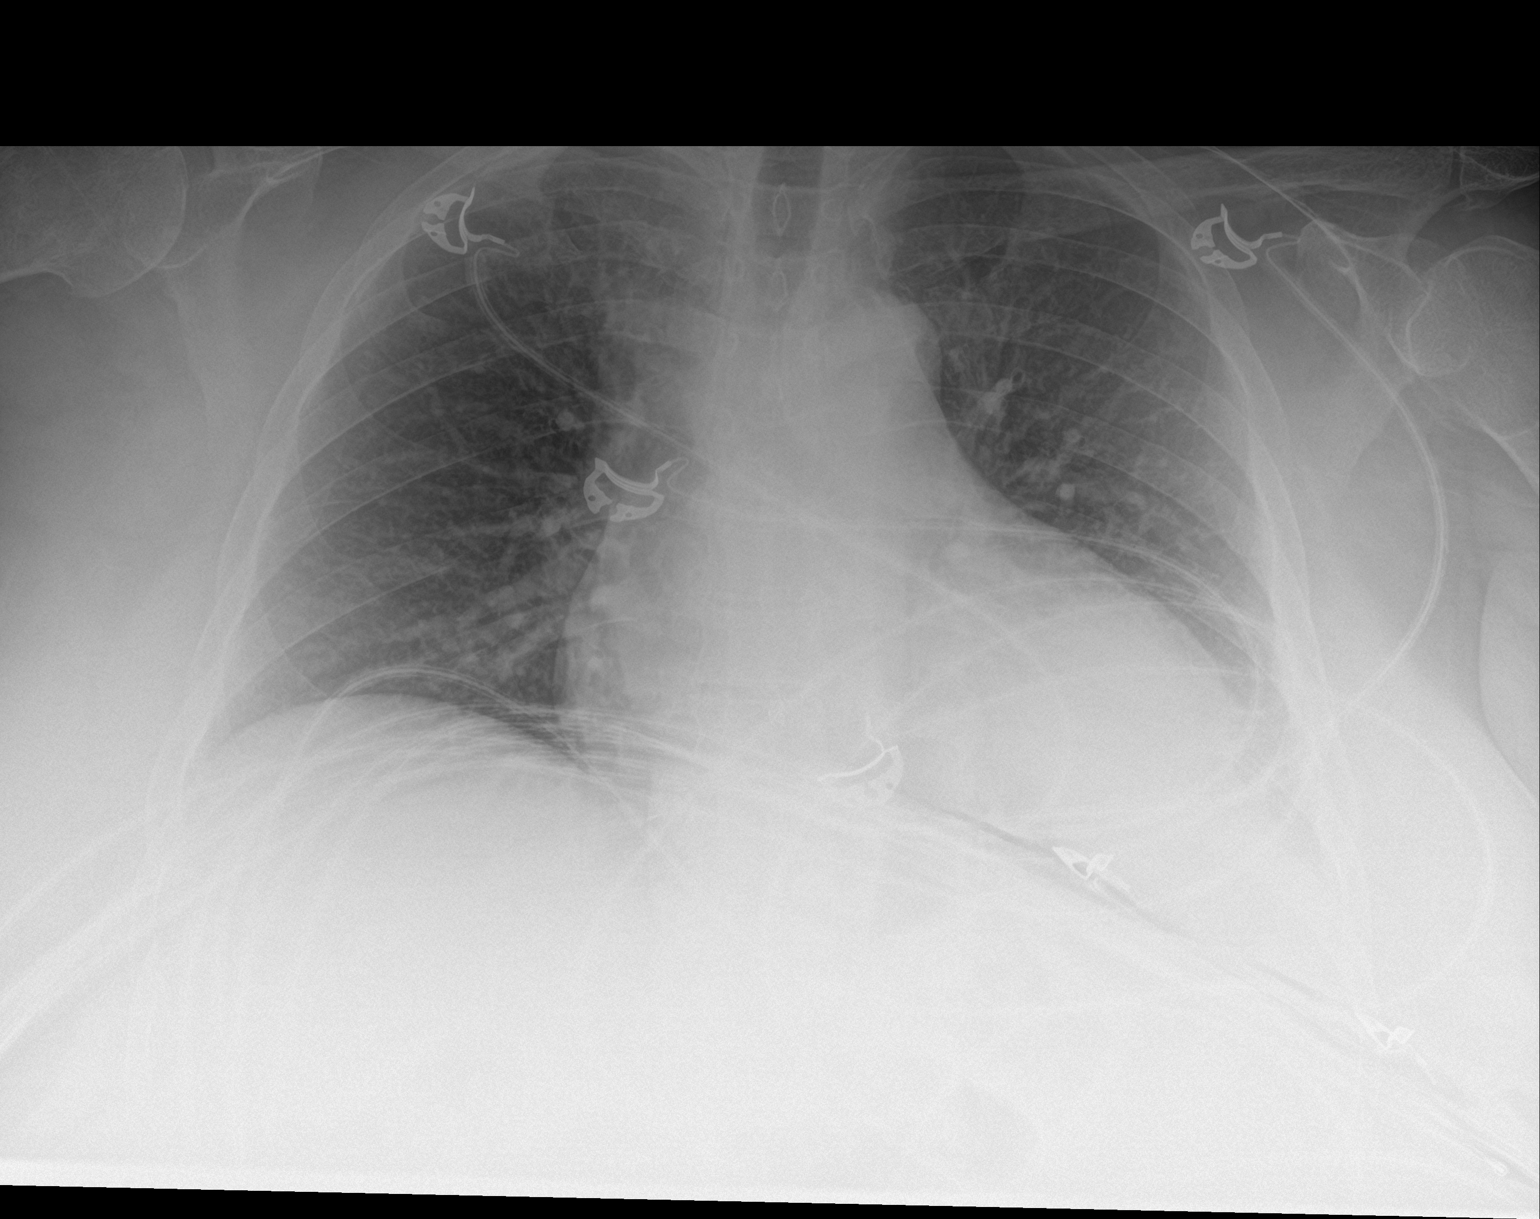

[2 of 2 positions shown; findings below may reference images not displayed]

FINDINGS: The heart size is stable at the upper limits of normal for [facility]
technique. There is chronic left lower lobe opacity which has become
more mass-like on today's lateral view, previously linear. The lungs
are otherwise clear. There is no pleural effusion or pneumothorax.
The bones appear unchanged.
IMPRESSION: Increased, mass-like density surrounding the previously demonstrated
linear left lower lobe atelectasis/scarring. This could be secondary
to superimposed inflammation, although repeat chest CT recommended
to exclude developing neoplasm.

## 2020-10-24 NOTE — Telephone Encounter (Signed)
Caryl Pina left another VM stating she needs contact information on this patient. I have been unable to reach her back to give her this information.

## 2020-10-24 NOTE — Telephone Encounter (Signed)
Tried to call Caryl Pina but did not receive an answer and was unable to leave a message.

## 2020-10-29 DIAGNOSIS — U071 COVID-19: Secondary | ICD-10-CM | POA: Diagnosis not present

## 2020-10-29 DIAGNOSIS — Z20828 Contact with and (suspected) exposure to other viral communicable diseases: Secondary | ICD-10-CM | POA: Diagnosis not present

## 2020-11-05 ENCOUNTER — Other Ambulatory Visit: Payer: Self-pay | Admitting: *Deleted

## 2020-11-05 DIAGNOSIS — U071 COVID-19: Secondary | ICD-10-CM | POA: Diagnosis not present

## 2020-11-05 DIAGNOSIS — Z20828 Contact with and (suspected) exposure to other viral communicable diseases: Secondary | ICD-10-CM | POA: Diagnosis not present

## 2020-11-06 IMAGING — DX DG CHEST 1V PORT
1 series · 1 of 1 positions shown · non-contrast
Comparison: 05/22/2018.

CLINICAL DATA: Post bronchoscopy.

EXAM:
PORTABLE CHEST 1 VIEW

[chest ap]
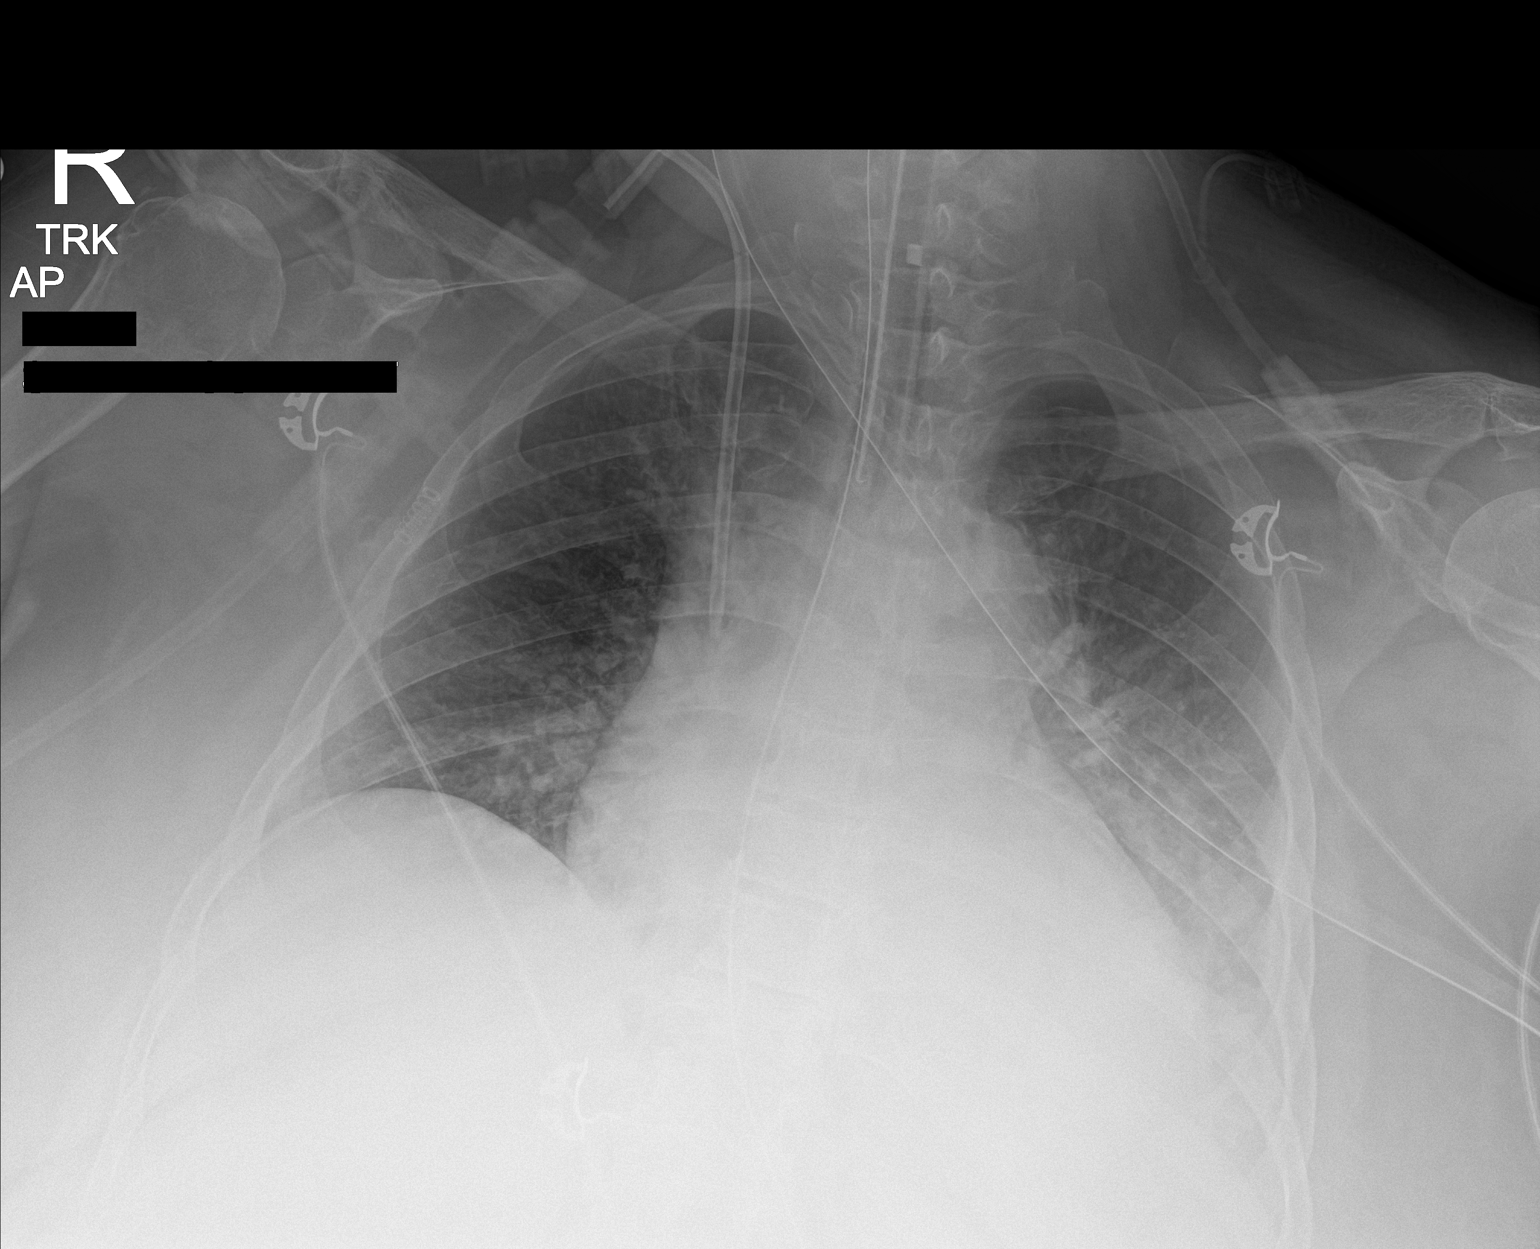

[1 of 1 positions shown; findings below may reference images not displayed]

FINDINGS: Right IJ line noted with tip over superior vena cava. NG tube noted
with tip below left hemidiaphragm. Cardiomegaly with bilateral
pulmonary infiltrates/edema left side greater than right consistent
with CHF. Bilateral pneumonia can not be excluded. Findings have
progressed from prior exam. Small left pleural effusion. No
pneumothorax.
IMPRESSION: Lines and tubes in stable position. Congestive heart failure
bilateral pulmonary edema left side greater right and left-sided
pleural effusion. Findings consistent CHF. Bilateral pneumonia can
not be excluded. Findings have progressed from prior exam.

## 2020-11-12 DIAGNOSIS — Z20828 Contact with and (suspected) exposure to other viral communicable diseases: Secondary | ICD-10-CM | POA: Diagnosis not present

## 2020-11-12 DIAGNOSIS — M79674 Pain in right toe(s): Secondary | ICD-10-CM | POA: Diagnosis not present

## 2020-11-12 DIAGNOSIS — U071 COVID-19: Secondary | ICD-10-CM | POA: Diagnosis not present

## 2020-11-12 DIAGNOSIS — M79675 Pain in left toe(s): Secondary | ICD-10-CM | POA: Diagnosis not present

## 2020-11-12 DIAGNOSIS — B351 Tinea unguium: Secondary | ICD-10-CM | POA: Diagnosis not present

## 2020-11-15 ENCOUNTER — Ambulatory Visit (INDEPENDENT_AMBULATORY_CARE_PROVIDER_SITE_OTHER): Payer: Medicare Other | Admitting: Pulmonary Disease

## 2020-11-15 ENCOUNTER — Telehealth: Payer: Self-pay | Admitting: Pulmonary Disease

## 2020-11-15 ENCOUNTER — Encounter: Payer: Self-pay | Admitting: Pulmonary Disease

## 2020-11-15 ENCOUNTER — Other Ambulatory Visit: Payer: Self-pay | Admitting: "Endocrinology

## 2020-11-15 ENCOUNTER — Other Ambulatory Visit: Payer: Self-pay

## 2020-11-15 VITALS — BP 118/76 | HR 53 | Ht 63.0 in | Wt 258.0 lb

## 2020-11-15 DIAGNOSIS — D3A090 Benign carcinoid tumor of the bronchus and lung: Secondary | ICD-10-CM

## 2020-11-15 DIAGNOSIS — E1122 Type 2 diabetes mellitus with diabetic chronic kidney disease: Secondary | ICD-10-CM | POA: Diagnosis not present

## 2020-11-15 DIAGNOSIS — J988 Other specified respiratory disorders: Secondary | ICD-10-CM

## 2020-11-15 DIAGNOSIS — C3492 Malignant neoplasm of unspecified part of left bronchus or lung: Secondary | ICD-10-CM

## 2020-11-15 DIAGNOSIS — I5032 Chronic diastolic (congestive) heart failure: Secondary | ICD-10-CM | POA: Diagnosis not present

## 2020-11-15 NOTE — Progress Notes (Signed)
Synopsis: Referred in August 2022 for endobronchial carcinoid, by Dr. Sondra Come, PCP: By Rosita Fire, MD  Subjective:   PATIENT ID: [name] GENDER: female DOB: 03/15/1956, MRN: [id number]  Chief Complaint  Patient presents with   Consult    Per patient for possible lung nodule. States she has a productive cough with mostly clear fluid, but will notice a few yellow streaks from time to time.      This is a 65 year old female, past medical history of asthma, atrial fibrillation, chronic diastolic heart failure, hypertension, morbid obesity, obstructive sleep apnea and carcinoid tumor of the left lung.  Patient was referred to radiation oncology after repeat CT imaging showed possible progression.  Initially presented to the emergency department in February 2020 for community-acquired pneumonia.  CT scan of the chest revealed a masslike consolidation 5 x 8.2 cm over the left lower lobe.  Patient underwent prompt bronchoscopy in February 2020 which revealed an endobronchial lesion that favor typical carcinoid and atypical squamous metaplasia which was favored to be reactive.  There was near total occlusion of the lateral and posterior segment of the left lower lobe visible endobronchial tumor at this time.  Patient underwent pet imaging in September 2020 which showed the solitary well differentiated neuroendocrine tumor within the left infrahilar lung located in the distal aspect of the left mainstem.  There was no additional metastasis found.  Patient had repeat CT scan of the chest in April 2022.  This repeat CT chest revealed a central solid lesion 1.5 x 1.5 within the left lower lobe airway.  Consistent with her history of endobronchial carcinoid.  She was referred to radiation oncology for considerations of treatment.  However discussion at medical thoracic oncology conference understanding that radiation is not always helpful to this tumor type.  Patient was referred to pulmonary for  consideration of tumor debulking and endobronchial cryotherapy.   Oncology History   No history exists.     Past Medical History:  Diagnosis Date   Arthritis    Asthma    Atrial fibrillation ([place])    Carcinoid tumor determined by biopsy of lung 05/2018   Left lung   Chronic diastolic CHF (congestive heart failure) ([place])    CKD (chronic kidney disease), stage III ([place])    Diabetes mellitus type 2 in obese [facility])    Essential hypertension    Iron deficiency anemia 10/16/2010   Mental handicap 10/16/2010   Mild CAD 2013   Morbid obesity ([facility])    OSA (obstructive sleep apnea)      Family History  Problem Relation Age of Onset   Diabetes Mother    Hypertension Mother    Heart failure Mother    Hypertension Father    Atrial fibrillation Father    Kidney disease Other        son reportedly has had cyst on kidney and lung s/p surgery at [facility]   Hypertension Son    Colon cancer Neg Hx      Past Surgical History:  Procedure Laterality Date   ABDOMINAL HYSTERECTOMY     AV FISTULA PLACEMENT Right 06/06/2018   Procedure: ARTERIOVENOUS (AV) FISTULA CREATION;  Surgeon: Marty Heck, MD;  Location: [facility] OR;  Service: Vascular;  Laterality: Right;   CESAREAN SECTION     CHOLECYSTECTOMY     COLONOSCOPY  08/2010   normal TI, sigmoid polyp (adenoma ). Next TCS due  08/2015,   COLONOSCOPY WITH PROPOFOL N/A 07/08/2020   Procedure: COLONOSCOPY WITH PROPOFOL;  Surgeon: Daneil Dolin, MD;  Location: [facility];  Service: Endoscopy;  Laterality: N/A;  AM (diabetic and facility patient)   ESOPHAGOGASTRODUODENOSCOPY  08/2010   antral and duodenal erosions s/p bx (chronic gastritis, no h.pylori, no celiac dz ), hiatal hernia   FISTULA SUPERFICIALIZATION Right 08/25/2018   Procedure: FISTULA SUPERFICIALIZATION RIGHT ARM;  Surgeon: Waynetta Sandy, MD;  Location: [place];  Service: Cardiovascular;  Laterality: Right;   IR FLUORO GUIDE CV LINE RIGHT  05/31/2018   IR US GUIDE VASC ACCESS  RIGHT  05/31/2018   KNEE SURGERY     right knee @ 65 years of age   LEFT AND RIGHT HEART CATHETERIZATION WITH CORONARY ANGIOGRAM N/A 09/21/2011   Procedure: LEFT AND RIGHT HEART CATHETERIZATION WITH CORONARY ANGIOGRAM;  Surgeon: Birdie Riddle, MD;  Location: [facility];  Service: Cardiovascular;  Laterality: N/A;   POLYPECTOMY  07/08/2020   Procedure: POLYPECTOMY INTESTINAL;  Surgeon: Daneil Dolin, MD;  [facility];  Service: Endoscopy;;    Social History   Socioeconomic History   Marital status: Divorced    Spouse name: Not on file   Number of children: 1   Years of education: Not on file   Highest education level: Not on file  Occupational History    Employer: UNEMPLOYED  Tobacco Use   Smoking status: Former    Packs/day: 1.00    Years: 0.00    Pack years: 0.00    Types: Cigarettes    Quit date: 04/11/1996    Years since quitting: 24.6   Smokeless tobacco: Never   Tobacco comments:    quit a couple year ago  Vaping Use   Vaping Use: Never used  Substance and Sexual Activity   Alcohol use: No   Drug use: No   Sexual activity: Never  Other Topics Concern   Not on file  Social History Narrative   Lives with parents.    Social Determinants of Health   Financial Resource Strain: Not on file  Food Insecurity: Not on file  Transportation Needs: Not on file  Physical Activity: Not on file  Stress: Not on file  Social Connections: Not on file  Intimate Partner Violence: Not on file     No Known Allergies   Outpatient Medications Prior to Visit  Medication Sig Dispense Refill   acetaminophen (TYLENOL) 325 MG tablet Take 325 mg by mouth every 12 (twelve) hours as needed for moderate pain.     albuterol (PROVENTIL HFA;VENTOLIN HFA) 108 (90 Base) MCG/ACT inhaler Inhale 2 puffs into the lungs every 6 (six) hours as needed for shortness of breath.     apixaban (ELIQUIS) 5 MG TABS tablet Take 1 tablet (5 mg total) by mouth 2 (two) times daily. 60 tablet 0    betamethasone dipropionate (DIPROLENE) 0.05 % ointment Apply 1 application topically daily as needed (for rash under breasts).      calcitRIOL (ROCALTROL) 0.25 MCG capsule Take by mouth.     cetirizine (ZYRTEC) 10 MG tablet Take 10 mg by mouth daily. (0800)     clotrimazole (LOTRIMIN) 1 % cream Apply 1 application topically daily as needed (under breasts as needed for rash).     DIABETIC TUSSIN EX 100 MG/5ML syrup Take 100 mg by mouth every 4 (four) hours as needed for cough or congestion.      diltiazem (CARDIZEM CD) 240 MG 24 hr capsule Take 1 capsule (240 mg total) by mouth daily. (Patient taking differently: Take 240  mg by mouth daily. (0800)) 30 capsule 0   DROPSAFE SAFETY PEN NEEDLES 31G X 6 MM MISC      fluticasone (FLONASE) 50 MCG/ACT nasal spray Place 2 sprays into both nostrils daily as needed for allergies or rhinitis.     Fluticasone-Umeclidin-Vilant 100-62.5-25 MCG/INH AEPB Inhale 1 puff into the lungs daily. (0800)     furosemide (LASIX) 40 MG tablet Take 40 mg by mouth 2 (two) times daily.     glucose blood (EASYMAX TEST) test strip CHECK BLOOD SUGAR 4 TIMES A DAY. (BREAKFAST, LUNCH, SUPPER & BEDTIME) 100 strip 5   guaiFENesin (MUCINEX) 600 MG 12 hr tablet Take 1 tablet (600 mg total) by mouth 2 (two) times daily as needed. (Patient taking differently: Take 600 mg by mouth 2 (two) times daily as needed (cough/congestion).) 60 tablet 11   insulin regular human CONCENTRATED (HUMULIN R U-500 KWIKPEN) 500 UNIT/ML kwikpen Inject 30-40 Units into the skin See admin instructions. INJECT 40UNITS SUBCUTANEOUSLY WITH BREAKFAST, 30 UNITS WITH LUNCH, & 30 UNITS WITH SUPPER WHEN BLOOD SUGAR IS > 90MG /DL AND PATIENT IS EATING. 7 mL 1   ipratropium-albuterol (DUONEB) 0.5-2.5 (3) MG/3ML SOLN Take 3 mLs by nebulization every 6 (six) hours as needed (for cough).      isosorbide dinitrate (ISORDIL) 10 MG tablet Take 1 tablet (10 mg total) by mouth 3 (three) times daily. (Patient taking differently: Take 10  mg by mouth 3 (three) times daily. (0800, 1400 & 2000)) 90 tablet 0   Lancets 28G MISC CHECK BLOOD SUGAR 4 TIMES A DAY. (BREAKFAST, LUNCH, SUPPER, & BEDTIME) 100 each 5   losartan (COZAAR) 25 MG tablet Take 25 mg by mouth daily.     metolazone (ZAROXOLYN) 5 MG tablet Take 5 mg by mouth in the morning. (0800)     metoprolol tartrate (LOPRESSOR) 50 MG tablet Take 50 mg by mouth 2 (two) times daily. (0800 & 2000)     montelukast (SINGULAIR) 10 MG tablet Take 10 mg by mouth at bedtime. (2000)     polyethylene glycol powder (GLYCOLAX/MIRALAX) 17 GM/SCOOP powder Take 17 g by mouth daily as needed for mild constipation or moderate constipation.     potassium chloride SA (KLOR-CON) 20 MEQ tablet Take 20 mEq by mouth 3 (three) times daily. (0800, 1400 & 2000)     rosuvastatin (CRESTOR) 20 MG tablet TAKE (1) TABLET BY MOUTH ONCE DAILY. (Patient taking differently: Take 20 mg by mouth daily at 8 pm. (2000)) 30 tablet 3   triamcinolone cream (KENALOG) 0.5 % Apply 1 application topically every 12 (twelve) hours as needed (applied to bilateral legs for allergic dermatitis).     Vitamin D, Cholecalciferol, 25 MCG (1000 UT) TABS Take 2,000 Units by mouth daily. (0800) 60 tablet 6   No facility-administered medications prior to visit.    Review of Systems  Constitutional:  Negative for chills, fever, malaise/fatigue and weight loss.  HENT:  Negative for hearing loss, sore throat and tinnitus.   Eyes:  Negative for blurred vision and double vision.  Respiratory:  Positive for cough and shortness of breath. Negative for hemoptysis, sputum production, wheezing and stridor.   Cardiovascular:  Negative for chest pain, palpitations, orthopnea, leg swelling and PND.  Gastrointestinal:  Negative for abdominal pain, constipation, diarrhea, heartburn, nausea and vomiting.  Genitourinary:  Negative for dysuria, hematuria and urgency.  Musculoskeletal:  Negative for joint pain and myalgias.  Skin:  Negative for itching  and rash.  Neurological:  Negative for dizziness, tingling, weakness  and headaches.  Endo/Heme/Allergies:  Negative for environmental allergies. Does not bruise/bleed easily.  Psychiatric/Behavioral:  Negative for depression. The patient is not nervous/anxious and does not have insomnia.   All other systems reviewed and are negative.   Objective:  Physical Exam Vitals reviewed.  Constitutional:      General: She is not in acute distress.    Appearance: She is well-developed. She is obese.  HENT:     Head: Normocephalic and atraumatic.  Eyes:     General: No scleral icterus.    Conjunctiva/sclera: Conjunctivae normal.     Pupils: Pupils are equal, round, and reactive to light.  Neck:     Vascular: No JVD.     Trachea: No tracheal deviation.  Cardiovascular:     Rate and Rhythm: Normal rate and regular rhythm.     Heart sounds: Normal heart sounds. No murmur heard. Pulmonary:     Effort: Pulmonary effort is normal. No tachypnea, accessory muscle usage or respiratory distress.     Breath sounds: Normal breath sounds. No stridor. No wheezing, rhonchi or rales.  Abdominal:     General: Bowel sounds are normal. There is no distension.     Palpations: Abdomen is soft.     Tenderness: There is no abdominal tenderness.  Musculoskeletal:        General: No tenderness.     Cervical back: Neck supple.  Lymphadenopathy:     Cervical: No cervical adenopathy.  Skin:    General: Skin is warm and dry.     Capillary Refill: Capillary refill takes less than 2 seconds.     Findings: No rash.  Neurological:     Mental Status: She is alert and oriented to person, place, and time.  Psychiatric:        Behavior: Behavior normal.     Vitals:   11/15/20 0947  BP: 118/76  Pulse: (!) 53  SpO2: 99%  Weight: 258 lb (117 kg)  Height: 5\' 3"  (1.6 m)   99% on RA BMI Readings from Last 3 Encounters:  11/15/20 45.70 kg/m  10/16/20 45.45 kg/m  08/20/20 45.42 kg/m   Wt Readings from Last 3  Encounters:  11/15/20 258 lb (117 kg)  10/16/20 256 lb 9.6 oz (116.4 kg)  08/20/20 256 lb 6.4 oz (116.3 kg)     CBC    Component Value Date/Time   WBC 8.2 06/04/2020 1329   RBC 3.66 (L) 06/04/2020 1329   HGB 9.8 (L) 06/04/2020 1329   HCT 31.6 (L) 06/04/2020 1329   PLT 250 06/04/2020 1329   MCV 86.3 06/04/2020 1329   MCH 26.8 06/04/2020 1329   MCHC 31.0 06/04/2020 1329   RDW 15.2 06/04/2020 1329   LYMPHSABS 1.2 06/04/2020 1329   MONOABS 0.8 06/04/2020 1329   EOSABS 0.2 06/04/2020 1329   BASOSABS 0.1 06/04/2020 1329    Chest Imaging: April 2022 CT chest: Left lower lobe endobronchial lesion.  History of carcinoid. The patient's images have been independently reviewed by me.    Pulmonary Functions Testing Results: No flowsheet data found.  FeNO:   Pathology:   Echocardiogram:   Heart Catheterization:     Assessment & Plan:     ICD-10-CM   1. Carcinoid tumor determined by biopsy of lung  D3A.090     2. Endobronchial cancer, left ([place])  C34.92     3. Airway obstruction  J98.8       Discussion:  This is a 65 year old female, history of left lower lobe endobronchial  carcinoid.  She is also on Eliquis.  Additionally on triple therapy inhaler.  Recent imaging shows some progression and patient was referred for consideration of tumor debulking and cryotherapy.  Plan: Today in the office we discussed risk benefits and alternatives of proceeding with video bronchoscopy with cryotherapy and tumor debulking of the endobronchial carcinoid. We discussed the risk of bleeding. The chances of her needing to stay in the hospital if there was any associated complication. Patient is agreeable to proceed with this procedure. We will tentatively plan for 11/29/2020. Orders placed.  Appreciate PCC's help with care coordination and scheduling.   Current Outpatient Medications:    acetaminophen (TYLENOL) 325 MG tablet, Take 325 mg by mouth every 12 (twelve) hours as needed for  moderate pain., Disp: , Rfl:    albuterol (PROVENTIL HFA;VENTOLIN HFA) 108 (90 Base) MCG/ACT inhaler, Inhale 2 puffs into the lungs every 6 (six) hours as needed for shortness of breath., Disp: , Rfl:    apixaban (ELIQUIS) 5 MG TABS tablet, Take 1 tablet (5 mg total) by mouth 2 (two) times daily., Disp: 60 tablet, Rfl: 0   betamethasone dipropionate (DIPROLENE) 0.05 % ointment, Apply 1 application topically daily as needed (for rash under breasts). , Disp: , Rfl:    calcitRIOL (ROCALTROL) 0.25 MCG capsule, Take by mouth., Disp: , Rfl:    cetirizine (ZYRTEC) 10 MG tablet, Take 10 mg by mouth daily. (0800), Disp: , Rfl:    clotrimazole (LOTRIMIN) 1 % cream, Apply 1 application topically daily as needed (under breasts as needed for rash)., Disp: , Rfl:    DIABETIC TUSSIN EX 100 MG/5ML syrup, Take 100 mg by mouth every 4 (four) hours as needed for cough or congestion. , Disp: , Rfl:    diltiazem (CARDIZEM CD) 240 MG 24 hr capsule, Take 1 capsule (240 mg total) by mouth daily. (Patient taking differently: Take 240 mg by mouth daily. (0800)), Disp: 30 capsule, Rfl: 0   DROPSAFE SAFETY PEN NEEDLES 31G X 6 MM MISC, , Disp: , Rfl:    fluticasone (FLONASE) 50 MCG/ACT nasal spray, Place 2 sprays into both nostrils daily as needed for allergies or rhinitis., Disp: , Rfl:    Fluticasone-Umeclidin-Vilant 100-62.5-25 MCG/INH AEPB, Inhale 1 puff into the lungs daily. (0800), Disp: , Rfl:    furosemide (LASIX) 40 MG tablet, Take 40 mg by mouth 2 (two) times daily., Disp: , Rfl:    glucose blood (EASYMAX TEST) test strip, CHECK BLOOD SUGAR 4 TIMES A DAY. (BREAKFAST, LUNCH, SUPPER & BEDTIME), Disp: 100 strip, Rfl: 5   guaiFENesin (MUCINEX) 600 MG 12 hr tablet, Take 1 tablet (600 mg total) by mouth 2 (two) times daily as needed. (Patient taking differently: Take 600 mg by mouth 2 (two) times daily as needed (cough/congestion).), Disp: 60 tablet, Rfl: 11   insulin regular human CONCENTRATED (HUMULIN R U-500 KWIKPEN) 500  UNIT/ML kwikpen, Inject 30-40 Units into the skin See admin instructions. INJECT 40UNITS SUBCUTANEOUSLY WITH BREAKFAST, 30 UNITS WITH LUNCH, & 30 UNITS WITH SUPPER WHEN BLOOD SUGAR IS > 90MG /DL AND PATIENT IS EATING., Disp: 7 mL, Rfl: 1   ipratropium-albuterol (DUONEB) 0.5-2.5 (3) MG/3ML SOLN, Take 3 mLs by nebulization every 6 (six) hours as needed (for cough). , Disp: , Rfl:    isosorbide dinitrate (ISORDIL) 10 MG tablet, Take 1 tablet (10 mg total) by mouth 3 (three) times daily. (Patient taking differently: Take 10 mg by mouth 3 (three) times daily. (0800, 1400 & 2000)), Disp: 90 tablet, Rfl: 0  Lancets 28G MISC, CHECK BLOOD SUGAR 4 TIMES A DAY. (BREAKFAST, LUNCH, SUPPER, & BEDTIME), Disp: 100 each, Rfl: 5   losartan (COZAAR) 25 MG tablet, Take 25 mg by mouth daily., Disp: , Rfl:    metolazone (ZAROXOLYN) 5 MG tablet, Take 5 mg by mouth in the morning. (0800), Disp: , Rfl:    metoprolol tartrate (LOPRESSOR) 50 MG tablet, Take 50 mg by mouth 2 (two) times daily. (0800 & 2000), Disp: , Rfl:    montelukast (SINGULAIR) 10 MG tablet, Take 10 mg by mouth at bedtime. (2000), Disp: , Rfl:    polyethylene glycol powder (GLYCOLAX/MIRALAX) 17 GM/SCOOP powder, Take 17 g by mouth daily as needed for mild constipation or moderate constipation., Disp: , Rfl:    potassium chloride SA (KLOR-CON) 20 MEQ tablet, Take 20 mEq by mouth 3 (three) times daily. (0800, 1400 & 2000), Disp: , Rfl:    rosuvastatin (CRESTOR) 20 MG tablet, TAKE (1) TABLET BY MOUTH ONCE DAILY. (Patient taking differently: Take 20 mg by mouth daily at 8 pm. (2000)), Disp: 30 tablet, Rfl: 3   triamcinolone cream (KENALOG) 0.5 %, Apply 1 application topically every 12 (twelve) hours as needed (applied to bilateral legs for allergic dermatitis)., Disp: , Rfl:    Vitamin D, Cholecalciferol, 25 MCG (1000 UT) TABS, Take 2,000 Units by mouth daily. (0800), Disp: 60 tablet, Rfl: 6  I spent 62 minutes dedicated to the care of this patient on the date of  this encounter to include pre-visit review of records, face-to-face time with the patient discussing conditions above, post visit ordering of testing, clinical documentation with the electronic health record, making appropriate referrals as documented, and communicating necessary findings to members of the patients care team.   Garner Nash, DO [facility] 11/15/2020 10:04 AM

## 2020-11-15 NOTE — Patient Instructions (Signed)
Thank you for visiting Dr. Valeta Harms at [facility]. Today we recommend the following:  Orders Placed This Encounter  Procedures   Procedural/ Surgical Case Request: [place]   Ambulatory referral to Pulmonology   Nothing to eat the night before past midnight Case planned for 11/29/2020  Return in about 4 weeks (around 12/13/2020) for with APP or Dr. Valeta Harms.    Please do your part to reduce the spread of COVID-19.

## 2020-11-15 NOTE — Telephone Encounter (Signed)
The following has been scheduled & pt's facility has been made aware: [facility] - 9/2 @ 9:15 AM, pt checking in by 6:30 AM.  Per Larene [place], ok to do COVID test prior.

## 2020-11-15 NOTE — H&P (View-Only) (Signed)
Synopsis: Referred in August 2022 for endobronchial carcinoid, by Dr. Sondra [name], PCP: By Anne Fire, MD  Subjective:   PATIENT ID: [name] DOB: 1955-06-13, MRN: [id number]  Chief Complaint  Patient presents with   Consult    Per patient for possible lung nodule. States she has a productive cough with mostly clear fluid, but will notice a few yellow streaks from time to time.      This is a 65 year old [name], past medical history of asthma, atrial fibrillation, chronic diastolic heart failure, hypertension, morbid obesity, obstructive sleep apnea and carcinoid tumor of the left lung.  Patient was referred to radiation oncology after repeat CT imaging showed possible progression.  Initially presented to the emergency department in February 2020 for community-acquired pneumonia.  CT scan of the chest revealed a masslike consolidation 5 x 8.2 cm over the left lower lobe.  Patient underwent prompt bronchoscopy in February 2020 which revealed an endobronchial lesion that favor typical carcinoid and atypical squamous metaplasia which was favored to be reactive.  There was near total occlusion of the lateral and posterior segment of the left lower lobe visible endobronchial tumor at this time.  Patient underwent pet imaging in September 2020 which showed the solitary well differentiated neuroendocrine tumor within the left infrahilar lung located in the distal aspect of the left mainstem.  There was no additional metastasis found.  Patient had repeat CT scan of the chest in April 2022.  This repeat CT chest revealed a central solid lesion 1.5 x 1.5 within the left lower lobe airway.  Consistent with her history of endobronchial carcinoid.  She was referred to radiation oncology for considerations of treatment.  However discussion at medical thoracic oncology conference understanding that radiation is not always helpful to this tumor type.  Patient was referred to pulmonary for  consideration of tumor debulking and endobronchial cryotherapy.   Oncology History   No history exists.     Past Medical History:  Diagnosis Date   Arthritis    Asthma    Atrial fibrillation ([place])    Carcinoid tumor determined by biopsy of lung 05/2018   Left lung   Chronic diastolic CHF (congestive heart failure) ([place])    CKD (chronic kidney disease), stage III ([place])    Diabetes mellitus type 2 in obese [facility])    Essential hypertension    Iron deficiency anemia 10/16/2010   Mental handicap 10/16/2010   Mild CAD 2013   Morbid obesity ([facility])    OSA (obstructive sleep apnea)      Family History  Problem Relation Age of Onset   Diabetes Mother    Hypertension Mother    Heart failure Mother    Hypertension Father    Atrial fibrillation Father    Kidney disease Other        son reportedly has had cyst on kidney and lung s/p surgery at [facility]   Hypertension Son    Colon cancer Neg Hx      Past Surgical History:  Procedure Laterality Date   ABDOMINAL HYSTERECTOMY     AV FISTULA PLACEMENT Right 06/06/2018   Procedure: ARTERIOVENOUS (AV) FISTULA CREATION;  Surgeon: Anne Heck, MD;  Location: [facility] OR;  Service: Vascular;  Laterality: Right;   CESAREAN SECTION     CHOLECYSTECTOMY     COLONOSCOPY  08/2010   normal TI, sigmoid polyp (adenoma ). Next TCS due  08/2015,   COLONOSCOPY WITH PROPOFOL N/A 07/08/2020   Procedure: COLONOSCOPY WITH PROPOFOL;  Surgeon: Anne Dolin, MD;  Location: [facility] ENDO [facility];  Service: Endoscopy;  Laterality: N/A;  AM (diabetic and facility patient)   ESOPHAGOGASTRODUODENOSCOPY  08/2010   antral and duodenal erosions s/p bx (chronic gastritis, no h.pylori, no celiac dz ), hiatal hernia   FISTULA SUPERFICIALIZATION Right 08/25/2018   Procedure: FISTULA SUPERFICIALIZATION RIGHT ARM;  Surgeon: Anne Sandy, MD;  Location: [facility];  Service: Cardiovascular;  Laterality: Right;   IR FLUORO GUIDE CV LINE RIGHT  05/31/2018   IR US GUIDE VASC ACCESS  RIGHT  05/31/2018   KNEE SURGERY     right knee @ 65 years of age   LEFT AND RIGHT HEART CATHETERIZATION WITH CORONARY ANGIOGRAM N/A 09/21/2011   Procedure: LEFT AND RIGHT HEART CATHETERIZATION WITH CORONARY ANGIOGRAM;  Surgeon: Anne Riddle, MD;  Location: [facility] CATH LAB;  Service: Cardiovascular;  Laterality: N/A;   POLYPECTOMY  07/08/2020   Procedure: POLYPECTOMY INTESTINAL;  Surgeon: Anne Dolin, MD;  [facility];  Service: Endoscopy;;    Social History   Socioeconomic History   Marital status: Divorced    Spouse name: Not on file   Number of children: 1   Years of education: Not on file   Highest education level: Not on file  Occupational History    Employer: UNEMPLOYED  Tobacco Use   Smoking status: Former    Packs/day: 1.00    Years: 0.00    Pack years: 0.00    Types: Cigarettes    Quit date: 04/11/1996    Years since quitting: 24.6   Smokeless tobacco: Never   Tobacco comments:    quit a couple year ago  Vaping Use   Vaping Use: Never used  Substance and Sexual Activity   Alcohol use: No   Drug use: No   Sexual activity: Never  Other Topics Concern   Not on file  Social History Narrative   Lives with parents.    Social Determinants of Health   Financial Resource Strain: Not on file  Food Insecurity: Not on file  Transportation Needs: Not on file  Physical Activity: Not on file  Stress: Not on file  Social Connections: Not on file  Intimate Partner Violence: Not on file     No Known Allergies   Outpatient Medications Prior to Visit  Medication Sig Dispense Refill   acetaminophen (TYLENOL) 325 MG tablet Take 325 mg by mouth every 12 (twelve) hours as needed for moderate pain.     albuterol (PROVENTIL HFA;VENTOLIN HFA) 108 (90 Base) MCG/ACT inhaler Inhale 2 puffs into the lungs every 6 (six) hours as needed for shortness of breath.     apixaban (ELIQUIS) 5 MG TABS tablet Take 1 tablet (5 mg total) by mouth 2 (two) times daily. 60 tablet 0    betamethasone dipropionate (DIPROLENE) 0.05 % ointment Apply 1 application topically daily as needed (for rash under breasts).      calcitRIOL (ROCALTROL) 0.25 MCG capsule Take by mouth.     cetirizine (ZYRTEC) 10 MG tablet Take 10 mg by mouth daily. (0800)     clotrimazole (LOTRIMIN) 1 % cream Apply 1 application topically daily as needed (under breasts as needed for rash).     DIABETIC TUSSIN EX 100 MG/5ML syrup Take 100 mg by mouth every 4 (four) hours as needed for cough or congestion.      diltiazem (CARDIZEM CD) 240 MG 24 hr capsule Take 1 capsule (240 mg total) by mouth daily. (Patient taking differently: Take 240  mg by mouth daily. (0800)) 30 capsule 0   DROPSAFE SAFETY PEN NEEDLES 31G X 6 MM MISC      fluticasone (FLONASE) 50 MCG/ACT nasal spray Place 2 sprays into both nostrils daily as needed for allergies or rhinitis.     Fluticasone-Umeclidin-Vilant 100-62.5-25 MCG/INH AEPB Inhale 1 puff into the lungs daily. (0800)     furosemide (LASIX) 40 MG tablet Take 40 mg by mouth 2 (two) times daily.     glucose blood (EASYMAX TEST) test strip CHECK BLOOD SUGAR 4 TIMES A DAY. (BREAKFAST, LUNCH, SUPPER & BEDTIME) 100 strip 5   guaiFENesin (MUCINEX) 600 MG 12 hr tablet Take 1 tablet (600 mg total) by mouth 2 (two) times daily as needed. (Patient taking differently: Take 600 mg by mouth 2 (two) times daily as needed (cough/congestion).) 60 tablet 11   insulin regular human CONCENTRATED (HUMULIN R U-500 KWIKPEN) 500 UNIT/ML kwikpen Inject 30-40 Units into the skin See admin instructions. INJECT 40UNITS SUBCUTANEOUSLY WITH BREAKFAST, 30 UNITS WITH LUNCH, & 30 UNITS WITH SUPPER WHEN BLOOD SUGAR IS > 90MG /DL AND PATIENT IS EATING. 7 mL 1   ipratropium-albuterol (DUONEB) 0.5-2.5 (3) MG/3ML SOLN Take 3 mLs by nebulization every 6 (six) hours as needed (for cough).      isosorbide dinitrate (ISORDIL) 10 MG tablet Take 1 tablet (10 mg total) by mouth 3 (three) times daily. (Patient taking differently: Take 10  mg by mouth 3 (three) times daily. (0800, 1400 & 2000)) 90 tablet 0   Lancets 28G MISC CHECK BLOOD SUGAR 4 TIMES A DAY. (BREAKFAST, LUNCH, SUPPER, & BEDTIME) 100 each 5   losartan (COZAAR) 25 MG tablet Take 25 mg by mouth daily.     metolazone (ZAROXOLYN) 5 MG tablet Take 5 mg by mouth in the morning. (0800)     metoprolol tartrate (LOPRESSOR) 50 MG tablet Take 50 mg by mouth 2 (two) times daily. (0800 & 2000)     montelukast (SINGULAIR) 10 MG tablet Take 10 mg by mouth at bedtime. (2000)     polyethylene glycol powder (GLYCOLAX/MIRALAX) 17 GM/SCOOP powder Take 17 g by mouth daily as needed for mild constipation or moderate constipation.     potassium chloride SA (KLOR-CON) 20 MEQ tablet Take 20 mEq by mouth 3 (three) times daily. (0800, 1400 & 2000)     rosuvastatin (CRESTOR) 20 MG tablet TAKE (1) TABLET BY MOUTH ONCE DAILY. (Patient taking differently: Take 20 mg by mouth daily at 8 pm. (2000)) 30 tablet 3   triamcinolone cream (KENALOG) 0.5 % Apply 1 application topically every 12 (twelve) hours as needed (applied to bilateral legs for allergic dermatitis).     Vitamin D, Cholecalciferol, 25 MCG (1000 UT) TABS Take 2,000 Units by mouth daily. (0800) 60 tablet 6   No facility-administered medications prior to visit.    Review of Systems  Constitutional:  Negative for chills, fever, malaise/fatigue and weight loss.  HENT:  Negative for hearing loss, sore throat and tinnitus.   Eyes:  Negative for blurred vision and double vision.  Respiratory:  Positive for cough and shortness of breath. Negative for hemoptysis, sputum production, wheezing and stridor.   Cardiovascular:  Negative for chest pain, palpitations, orthopnea, leg swelling and PND.  Gastrointestinal:  Negative for abdominal pain, constipation, diarrhea, heartburn, nausea and vomiting.  Genitourinary:  Negative for dysuria, hematuria and urgency.  Musculoskeletal:  Negative for joint pain and myalgias.  Skin:  Negative for itching  and rash.  Neurological:  Negative for dizziness, tingling, weakness  and headaches.  Endo/Heme/Allergies:  Negative for environmental allergies. Does not bruise/bleed easily.  Psychiatric/Behavioral:  Negative for depression. The patient is not nervous/anxious and does not have insomnia.   All other systems reviewed and are negative.   Objective:  Physical Exam Vitals reviewed.  Constitutional:      General: She is not in acute distress.    Appearance: She is well-developed. She is obese.  HENT:     Head: Normocephalic and atraumatic.  Eyes:     General: No scleral icterus.    Conjunctiva/sclera: Conjunctivae normal.     Pupils: Pupils are equal, round, and reactive to light.  Neck:     Vascular: No JVD.     Trachea: No tracheal deviation.  Cardiovascular:     Rate and Rhythm: Normal rate and regular rhythm.     Heart sounds: Normal heart sounds. No murmur heard. Pulmonary:     Effort: Pulmonary effort is normal. No tachypnea, accessory muscle usage or respiratory distress.     Breath sounds: Normal breath sounds. No stridor. No wheezing, rhonchi or rales.  Abdominal:     General: Bowel sounds are normal. There is no distension.     Palpations: Abdomen is soft.     Tenderness: There is no abdominal tenderness.  Musculoskeletal:        General: No tenderness.     Cervical back: Neck supple.  Lymphadenopathy:     Cervical: No cervical adenopathy.  Skin:    General: Skin is warm and dry.     Capillary Refill: Capillary refill takes less than 2 seconds.     Findings: No rash.  Neurological:     Mental Status: She is alert and oriented to person, place, and time.  Psychiatric:        Behavior: Behavior normal.     Vitals:   11/15/20 0947  BP: 118/76  Pulse: (!) 53  SpO2: 99%  Weight: 258 lb (117 kg)  Height: 5\' 3"  (1.6 m)   99% on RA BMI Readings from Last 3 Encounters:  11/15/20 45.70 kg/m  10/16/20 45.45 kg/m  08/20/20 45.42 kg/m   Wt Readings from Last 3  Encounters:  11/15/20 258 lb (117 kg)  10/16/20 256 lb 9.6 oz (116.4 kg)  08/20/20 256 lb 6.4 oz (116.3 kg)     CBC    Component Value Date/Time   WBC 8.2 06/04/2020 1329   RBC 3.66 (L) 06/04/2020 1329   HGB 9.8 (L) 06/04/2020 1329   HCT 31.6 (L) 06/04/2020 1329   PLT 250 06/04/2020 1329   MCV 86.3 06/04/2020 1329   MCH 26.8 06/04/2020 1329   MCHC 31.0 06/04/2020 1329   RDW 15.2 06/04/2020 1329   LYMPHSABS 1.2 06/04/2020 1329   MONOABS 0.8 06/04/2020 1329   EOSABS 0.2 06/04/2020 1329   BASOSABS 0.1 06/04/2020 1329    Chest Imaging: April 2022 CT chest: Left lower lobe endobronchial lesion.  History of carcinoid. The patient's images have been independently reviewed by me.    Pulmonary Functions Testing Results: No flowsheet data found.  FeNO:   Pathology:   Echocardiogram:   Heart Catheterization:     Assessment & Plan:     ICD-10-CM   1. Carcinoid tumor determined by biopsy of lung  D3A.090     2. Endobronchial cancer, left ([place])  C34.92     3. Airway obstruction  J98.8       Discussion:  This is a 65 year old [name], history of left lower lobe endobronchial  carcinoid.  She is also on Eliquis.  Additionally on triple therapy inhaler.  Recent imaging shows some progression and patient was referred for consideration of tumor debulking and cryotherapy.  Plan: Today in the office we discussed risk benefits and alternatives of proceeding with video bronchoscopy with cryotherapy and tumor debulking of the endobronchial carcinoid. We discussed the risk of bleeding. The chances of her needing to stay in the hospital if there was any associated complication. Patient is agreeable to proceed with this procedure. We will tentatively plan for 11/29/2020. Orders placed.  Appreciate PCC's help with care coordination and scheduling.   Current Outpatient Medications:    acetaminophen (TYLENOL) 325 MG tablet, Take 325 mg by mouth every 12 (twelve) hours as needed for  moderate pain., Disp: , Rfl:    albuterol (PROVENTIL HFA;VENTOLIN HFA) 108 (90 Base) MCG/ACT inhaler, Inhale 2 puffs into the lungs every 6 (six) hours as needed for shortness of breath., Disp: , Rfl:    apixaban (ELIQUIS) 5 MG TABS tablet, Take 1 tablet (5 mg total) by mouth 2 (two) times daily., Disp: 60 tablet, Rfl: 0   betamethasone dipropionate (DIPROLENE) 0.05 % ointment, Apply 1 application topically daily as needed (for rash under breasts). , Disp: , Rfl:    calcitRIOL (ROCALTROL) 0.25 MCG capsule, Take by mouth., Disp: , Rfl:    cetirizine (ZYRTEC) 10 MG tablet, Take 10 mg by mouth daily. (0800), Disp: , Rfl:    clotrimazole (LOTRIMIN) 1 % cream, Apply 1 application topically daily as needed (under breasts as needed for rash)., Disp: , Rfl:    DIABETIC TUSSIN EX 100 MG/5ML syrup, Take 100 mg by mouth every 4 (four) hours as needed for cough or congestion. , Disp: , Rfl:    diltiazem (CARDIZEM CD) 240 MG 24 hr capsule, Take 1 capsule (240 mg total) by mouth daily. (Patient taking differently: Take 240 mg by mouth daily. (0800)), Disp: 30 capsule, Rfl: 0   DROPSAFE SAFETY PEN NEEDLES 31G X 6 MM MISC, , Disp: , Rfl:    fluticasone (FLONASE) 50 MCG/ACT nasal spray, Place 2 sprays into both nostrils daily as needed for allergies or rhinitis., Disp: , Rfl:    Fluticasone-Umeclidin-Vilant 100-62.5-25 MCG/INH AEPB, Inhale 1 puff into the lungs daily. (0800), Disp: , Rfl:    furosemide (LASIX) 40 MG tablet, Take 40 mg by mouth 2 (two) times daily., Disp: , Rfl:    glucose blood (EASYMAX TEST) test strip, CHECK BLOOD SUGAR 4 TIMES A DAY. (BREAKFAST, LUNCH, SUPPER & BEDTIME), Disp: 100 strip, Rfl: 5   guaiFENesin (MUCINEX) 600 MG 12 hr tablet, Take 1 tablet (600 mg total) by mouth 2 (two) times daily as needed. (Patient taking differently: Take 600 mg by mouth 2 (two) times daily as needed (cough/congestion).), Disp: 60 tablet, Rfl: 11   insulin regular human CONCENTRATED (HUMULIN R U-500 KWIKPEN) 500  UNIT/ML kwikpen, Inject 30-40 Units into the skin See admin instructions. INJECT 40UNITS SUBCUTANEOUSLY WITH BREAKFAST, 30 UNITS WITH LUNCH, & 30 UNITS WITH SUPPER WHEN BLOOD SUGAR IS > 90MG /DL AND PATIENT IS EATING., Disp: 7 mL, Rfl: 1   ipratropium-albuterol (DUONEB) 0.5-2.5 (3) MG/3ML SOLN, Take 3 mLs by nebulization every 6 (six) hours as needed (for cough). , Disp: , Rfl:    isosorbide dinitrate (ISORDIL) 10 MG tablet, Take 1 tablet (10 mg total) by mouth 3 (three) times daily. (Patient taking differently: Take 10 mg by mouth 3 (three) times daily. (0800, 1400 & 2000)), Disp: 90 tablet, Rfl: 0  Lancets 28G MISC, CHECK BLOOD SUGAR 4 TIMES A DAY. (BREAKFAST, LUNCH, SUPPER, & BEDTIME), Disp: 100 each, Rfl: 5   losartan (COZAAR) 25 MG tablet, Take 25 mg by mouth daily., Disp: , Rfl:    metolazone (ZAROXOLYN) 5 MG tablet, Take 5 mg by mouth in the morning. (0800), Disp: , Rfl:    metoprolol tartrate (LOPRESSOR) 50 MG tablet, Take 50 mg by mouth 2 (two) times daily. (0800 & 2000), Disp: , Rfl:    montelukast (SINGULAIR) 10 MG tablet, Take 10 mg by mouth at bedtime. (2000), Disp: , Rfl:    polyethylene glycol powder (GLYCOLAX/MIRALAX) 17 GM/SCOOP powder, Take 17 g by mouth daily as needed for mild constipation or moderate constipation., Disp: , Rfl:    potassium chloride SA (KLOR-CON) 20 MEQ tablet, Take 20 mEq by mouth 3 (three) times daily. (0800, 1400 & 2000), Disp: , Rfl:    rosuvastatin (CRESTOR) 20 MG tablet, TAKE (1) TABLET BY MOUTH ONCE DAILY. (Patient taking differently: Take 20 mg by mouth daily at 8 pm. (2000)), Disp: 30 tablet, Rfl: 3   triamcinolone cream (KENALOG) 0.5 %, Apply 1 application topically every 12 (twelve) hours as needed (applied to bilateral legs for allergic dermatitis)., Disp: , Rfl:    Vitamin D, Cholecalciferol, 25 MCG (1000 UT) TABS, Take 2,000 Units by mouth daily. (0800), Disp: 60 tablet, Rfl: 6  I spent 62 minutes dedicated to the care of this patient on the date of  this encounter to include pre-visit review of records, face-to-face time with the patient discussing conditions above, post visit ordering of testing, clinical documentation with the electronic health record, making appropriate referrals as documented, and communicating necessary findings to members of the patients care team.   Garner Nash, DO [facility] 11/15/2020 10:04 AM

## 2020-11-19 DIAGNOSIS — E1122 Type 2 diabetes mellitus with diabetic chronic kidney disease: Secondary | ICD-10-CM | POA: Diagnosis not present

## 2020-11-19 DIAGNOSIS — Z794 Long term (current) use of insulin: Secondary | ICD-10-CM | POA: Diagnosis not present

## 2020-11-19 DIAGNOSIS — N184 Chronic kidney disease, stage 4 (severe): Secondary | ICD-10-CM | POA: Diagnosis not present

## 2020-11-20 DIAGNOSIS — Z20828 Contact with and (suspected) exposure to other viral communicable diseases: Secondary | ICD-10-CM | POA: Diagnosis not present

## 2020-11-20 DIAGNOSIS — U071 COVID-19: Secondary | ICD-10-CM | POA: Diagnosis not present

## 2020-11-20 LAB — COMPREHENSIVE METABOLIC PANEL
ALT: 10 IU/L (ref 0–32)
AST: 13 IU/L (ref 0–40)
Albumin/Globulin Ratio: 1.3 (ref 1.2–2.2)
Albumin: 3.7 g/dL — ABNORMAL LOW (ref 3.8–4.8)
Alkaline Phosphatase: 93 IU/L (ref 44–121)
BUN/Creatinine Ratio: 29 — ABNORMAL HIGH (ref 12–28)
BUN: 46 mg/dL — ABNORMAL HIGH (ref 8–27)
Bilirubin Total: 0.2 mg/dL (ref 0.0–1.2)
CO2: 25 mmol/L (ref 20–29)
Calcium: 8.7 mg/dL (ref 8.7–10.3)
Chloride: 97 mmol/L (ref 96–106)
Creatinine, Ser: 1.6 mg/dL — ABNORMAL HIGH (ref 0.57–1.00)
Globulin, Total: 2.9 g/dL (ref 1.5–4.5)
Glucose: 176 mg/dL — ABNORMAL HIGH (ref 65–99)
Potassium: 3.5 mmol/L (ref 3.5–5.2)
Sodium: 142 mmol/L (ref 134–144)
Total Protein: 6.6 g/dL (ref 6.0–8.5)
eGFR: 36 mL/min/{1.73_m2} — ABNORMAL LOW (ref >59)

## 2020-11-25 ENCOUNTER — Ambulatory Visit: Payer: Medicare Other | Admitting: "Endocrinology

## 2020-11-25 DIAGNOSIS — U071 COVID-19: Secondary | ICD-10-CM | POA: Diagnosis not present

## 2020-11-25 DIAGNOSIS — Z20828 Contact with and (suspected) exposure to other viral communicable diseases: Secondary | ICD-10-CM | POA: Diagnosis not present

## 2020-11-27 ENCOUNTER — Encounter (HOSPITAL_COMMUNITY): Payer: Self-pay | Admitting: Pulmonary Disease

## 2020-11-27 ENCOUNTER — Other Ambulatory Visit: Payer: Self-pay

## 2020-11-27 NOTE — Telephone Encounter (Signed)
[phone] states they cant get the patient to the hospital at 5:30am

## 2020-11-27 NOTE — Telephone Encounter (Signed)
Dr. Valeta Harms -   I spoke w/ Sunday Spillers w/ [facility].  The hospital called them & let them know that the patient will need to check in by 5:30 in the morning because she will need to do her covid test the same date of her procedure.  The facility is unable to transport the patient at that time & they have requested that we move this to a later date with the procedure start time no earlier than 10 AM so they can get the patient here by 7 AM.  Is there a particular day you would like me to reschedule this for?  [place] will need at least 5 business days to arrange her transportation.   Thanks,  [employer]

## 2020-11-27 NOTE — Progress Notes (Signed)
Surgical Instructions    Your procedure is scheduled on 11/29/2020.  Report to [facility] Main Entrance "A" at 0530 A.M., then check in with the Admitting office.  Call this number if you have problems the morning of surgery:  [phone]   If you have any questions prior to your surgery date call [phone]: Open Monday-Friday 8am-4pm    Remember:  Do not eat after midnight the night before your surgery.    Take these medicines the morning of surgery with A SIP OF WATER  Zyrtec, cardizem, fluticasone-umeclidin-vilent, isordil, losartan, metoprolol tartrate, rosuvastatin.  As needed: tylenol, Albuterol, mucinex, duoneb  Day of surgery do not take Humulin 500 unless blood sugar is greater than 220 then take half of usual dos    As of today, STOP taking any Aspirin (unless otherwise instructed by your surgeon) Aleve, Naproxen, Ibuprofen, Motrin, Advil, Goody's, BC's, all herbal medications, fish oil, and all vitamins.          Do not wear jewelry or makeup Do not wear lotions, powders, perfumes/colognes, or deodorant. Do not shave 48 hours prior to surgery.  Men may shave face and neck. Do not bring valuables to the hospital. DO Not wear nail polish, gel polish, artificial nails, or any other type of covering on  natural nails including finger and toenails. If patients have artificial nails, gel coating, etc. that need to be removed by a nail salon please have this removed prior to surgery or surgery may need to be canceled/delayed if the surgeon/ anesthesia feels like the patient is unable to be adequately monitored.             [facility] is not responsible for any belongings or valuables.  Do NOT Smoke (Tobacco/Vaping) or drink Alcohol 24 hours prior to your procedure If you use a CPAP at night, you may bring all equipment for your overnight stay.   Contacts, glasses, dentures or bridgework may not be worn into surgery, please bring cases for these belongings   For patients  admitted to the hospital, discharge time will be determined by your treatment team.   Patients discharged the day of surgery will not be allowed to drive home, and someone needs to stay with them for 24 hours.  ONLY 1 SUPPORT PERSON MAY BE PRESENT WHILE YOU ARE IN SURGERY. IF YOU ARE TO BE ADMITTED ONCE YOU ARE IN YOUR ROOM YOU WILL BE ALLOWED TWO (2) VISITORS.  Minor children may have two parents present. Special consideration for safety and communication needs will be reviewed on a case by case basis.  Special instructions:    Oral Hygiene is also important to reduce your risk of infection.  Remember - BRUSH YOUR TEETH THE MORNING OF SURGERY WITH YOUR REGULAR TOOTHPaste

## 2020-11-28 ENCOUNTER — Encounter (HOSPITAL_COMMUNITY): Payer: Self-pay | Admitting: *Deleted

## 2020-11-28 NOTE — Telephone Encounter (Addendum)
Darral Dash, RN  Cindia Hustead, Healy D; [place], Octavio Graves, DO Hey she can be rescheduled for 9/16 to follow the other case.  The procedure time will be 0930 but if she arrives at 0700 that should still give Short Stay plenty of time to get her test back and get her ready.     I spoke with Sunday Spillers at the [facility] to provide new procedure date & time & faxed info to [phone].  Rec'd fax confirm.  Updated note on order to provide new date/time of procedure as well as stopping Eliquis the 2 full days prior, with the last dose being on the evening of 12/10/2020.

## 2020-11-28 NOTE — Anesthesia Preprocedure Evaluation (Addendum)
Anesthesia Evaluation  Patient identified by MRN, date of birth, ID band Patient awake    Reviewed: Allergy & Precautions, NPO status , Patient's Chart, lab work & pertinent test results, reviewed documented beta blocker date and time   Airway Mallampati: II  TM Distance: >3 FB Neck ROM: Full    Dental  (+) Teeth Intact, Dental Advisory Given, Missing,    Pulmonary asthma , sleep apnea , former smoker,    Pulmonary exam normal breath sounds clear to auscultation       Cardiovascular hypertension, Pt. on home beta blockers and Pt. on medications + CAD and +CHF  Normal cardiovascular exam+ dysrhythmias Atrial Fibrillation  Rhythm:Regular Rate:Normal     Neuro/Psych negative neurological ROS     GI/Hepatic negative GI ROS, Neg liver ROS,   Endo/Other  diabetes, Type 2, Insulin DependentMorbid obesity  Renal/GU CRFRenal disease     Musculoskeletal  (+) Arthritis ,   Abdominal   Peds  Hematology  (+) Blood dyscrasia (Eliquis), ,   Anesthesia Other Findings Day of surgery medications reviewed with the patient.  Reproductive/Obstetrics                           Anesthesia Physical Anesthesia Plan  ASA: 3  Anesthesia Plan: General   Post-op Pain Management:    Induction: Intravenous  PONV Risk Score and Plan: 3 and Dexamethasone and Ondansetron  Airway Management Planned: Oral ETT  Additional Equipment:   Intra-op Plan:   Post-operative Plan: Extubation in OR  Informed Consent: I have reviewed the patients History and Physical, chart, labs and discussed the procedure including the risks, benefits and alternatives for the proposed anesthesia with the patient or authorized representative who has indicated his/her understanding and acceptance.     Dental advisory given  Plan Discussed with: CRNA  Anesthesia Plan Comments: (PAT note written 11/28/2020 by Myra Gianotti, PA-C. )        Anesthesia Quick Evaluation

## 2020-11-28 NOTE — Progress Notes (Addendum)
Anesthesia Chart Review: SAME DAY WORK-UP   Case: [id number] Date/Time: 12/13/20 0930   Procedure: VIDEO BRONCHOSCOPY WITHOUT FLUORO (Left) - Cryotherapy   Anesthesia type: Monitor Anesthesia Care   Diagnosis:      Carcinoid tumor determined by biopsy of lung [D3A.090]     Endobronchial cancer, left ([place]) [C34.92]     Airway obstruction [J98.8]   Pre-op diagnosis: Endobronchial cancer   Location: [facility] / [facility]   Surgeons: Garner Nash, DO       DISCUSSION: Patient is a 65 year old female scheduled for the above procedure.  Surgery was initially scheduled for 11/29/20 but is now rescheduled for 12/13/20.   She underwent bronchoscopy February 2020 for LLL masslike consolidation seen on imaging during admission for CAP. Pathology showed typical carcinoid tumor, atypical squamous metaplasia favored to be reactive. Recent Chest CT 07/23/20 showed central LLL nodule occluding the central LLL airways. She was referred to radiation oncology for considerations of treatment; however discussions at medical thoracic oncology conference indicated radiation is not always helpful to this tumor type, so she was referred to pulmonary for consideration of tumor debulking and endobronchial cryotherapy.  History includes former smoker (quit 04/11/96), CAD (minimal CAD, EF 35% 08/2011; EF > 55% 04/2018), afib, chronic diastolic CHF, HTN, CKD (stage IIIb), IDA, asthma, left lung carcinoid tumor (04/2018), obesity.   She is a resident at [facility]. She is for labs/EKG as indicated on arrival. Anesthesia team evaluation on the day of surgery. Her EKG on 09/19/19 showed junctional bradycardia. No new testing ordered at her last cardiology follow-up with Dr. Domenic Polite on 08/20/20.   She is on Eliquis. Staff message sent to Dr. Valeta Harms asking him to clarify with patient/staff any perioperative Eliquis instructions between now and surgery, if not done so already. (UPDATE 11/29/20 9:24 PM: Dr. Valeta Harms  is asking his staff to contact patient/caregivers to ask them to have her hold Eliquis two full days prior to surgery.)   VS:  BP Readings from Last 3 Encounters:  11/15/20 118/76  10/16/20 122/64  08/20/20 (!) 116/58   Pulse Readings from Last 3 Encounters:  11/15/20 (!) 53  10/16/20 63  08/20/20 61     PROVIDERS: Rosita Fire, MD is PCP Rozann Lesches, MD is cardiologist. Last visit 08/20/20.  No new orders.  20-month follow-up planned. He reviewed 09/19/19 EKG that showed a junctional rhythm.  Loni Beckwith, MD is endocrinologist Gery Pray, MD is RAD-ONC Sullivan Lone, MD is HEM-ONC Ulice Bold, MD is nephrologist   LABS: Labs as indicated on the day of surgery. H/H 9.3/29.9 with nephrology 08/22/20. Lab Results  Component Value Date   WBC 8.2 06/04/2020   HGB 9.8 (L) 06/04/2020   HCT 31.6 (L) 06/04/2020   PLT 250 06/04/2020   GLUCOSE 176 (H) 11/19/2020   ALT 10 11/19/2020   AST 13 11/19/2020   NA 142 11/19/2020   K 3.5 11/19/2020   CL 97 11/19/2020   CREATININE 1.60 (H) 11/19/2020   BUN 46 (H) 11/19/2020   CO2 25 11/19/2020   TSH 2.300 04/23/2020   HGBA1C 7.4 05/07/2020    IMAGES: CT Chest 07/23/20: IMPRESSION: 1. Central left lower lobe solid pulmonary nodule occluding the central left lower lobe airways is poorly delineated on this noncontrast CT, estimated at 1.5 x 1.5 cm, not appreciably changed, compatible with biopsy-proven carcinoid. Anterior left lower lobe postobstructive atelectasis. 2. No evidence of metastatic disease in the chest. 3. Stable mild cardiomegaly. Three-vessel coronary atherosclerosis.  4. Small hiatal hernia. 5. Finely irregular liver surface is suggestive of cirrhosis. 6. Stable right adrenal adenoma. 7. Chronic moderate T10 vertebral compression fracture. 8. Aortic Atherosclerosis (ICD10-I70.0).    EKG: Last EKG > 1 year ago. 09/19/19: Junctional bradycardia at 39 bpm.    CV: TEE 05/26/18: IMPRESSIONS   1.  Calcified, heterogeneous, primarily immobile lesion on the left  coronary cusp, appears to be primarily on the ventricular aspect of the  left coronary cusp. In clip 33 where it is well seen it measures 0.98 cm x  0.56 cm. The lesion has the appearance  of a subacute or healed vegetation. Differential also includes calcified  nodule in the setting of chronic dialysis.   2. The left ventricle has normal systolic function, with an ejection  fraction of 60-65%. The cavity size was normal.   3. The right ventricle has normal systolc function. The cavity was  normal. There is no increase in right ventricular wall thickness.   4. The aortic valve is tricuspid. Aortic valve regurgitation is trivial  by color flow Doppler.   5. The tricuspid valve was normal in structure.   6. The pulmonic valve was normal in structure.   7. There is evidence of severe atherosclerotic plaque in the aortic arch  and descending aorta.   8. Tiny patent foramen ovale with left to right shunting across the  atrial septum by color flow Doppler.   9. The interatrial septum appears to be lipomatous.  10. No left atrial appendage thrombus.    TTE 05/22/18: IMPRESSIONS   1. The left ventricle has hyperdynamic systolic function, with an  ejection fraction of >65%. The cavity size was normal. Left ventricular  diastolic Doppler parameters are consistent with impaired relaxation.   2. The right ventricle has normal systolic function. The cavity was  normal. There is no increase in right ventricular wall thickness.   3. The mitral valve is normal in structure.   4. The tricuspid valve is normal in structure.   5. The aortic valve is tricuspid Mild thickening of the aortic valve Mild  calcification of the aortic valve.   6. There is a small mobile density (1.1 x 0.5cm) on the ventricular  surface of the aortic valve. Challenging to visualize. Consider TEE if  clinically warranted.   7. The pulmonic valve was normal in  structure.    Cardiac cath 09/21/11: IMPRESSION OF HEART CATHETERIZATION:   Normal left main coronary artery. Minimal disease of left anterior descending artery and its branches. Mild disease of left circumflex artery and its branches. Normal right coronary artery. Modeate left ventricular systolic dysfunction.  LVEDP 20 mmHg.  Ejection fraction 35%. 6.  Mild pulmonary hypertension. RECOMMENDATION:   Medical treatment with blood pressure control.   Past Medical History:  Diagnosis Date   Arthritis    Asthma    Atrial fibrillation ([place])    Carcinoid tumor determined by biopsy of lung 05/2018   Left lung   Chronic diastolic CHF (congestive heart failure) ([place])    CKD (chronic kidney disease), stage III ([place])    Diabetes mellitus type 2 in obese [facility])    Essential hypertension    Iron deficiency anemia 10/16/2010   Mental handicap 10/16/2010   Mild CAD 2013   Morbid obesity ([facility])    OSA (obstructive sleep apnea)     Past Surgical History:  Procedure Laterality Date   ABDOMINAL HYSTERECTOMY     AV FISTULA PLACEMENT Right 06/06/2018   Procedure: ARTERIOVENOUS (  AV) FISTULA CREATION;  Surgeon: Marty Heck, MD;  Location: [facility];  Service: Vascular;  Laterality: Right;   CESAREAN SECTION     CHOLECYSTECTOMY     COLONOSCOPY  08/2010   normal TI, sigmoid polyp (adenoma ). Next TCS due  08/2015,   COLONOSCOPY WITH PROPOFOL N/A 07/08/2020   Procedure: COLONOSCOPY WITH PROPOFOL;  Surgeon: Daneil Dolin, MD;  [facility];  Service: Endoscopy;  Laterality: N/A;  AM (diabetic and facility patient)   ESOPHAGOGASTRODUODENOSCOPY  08/2010   antral and duodenal erosions s/p bx (chronic gastritis, no h.pylori, no celiac dz ), hiatal hernia   FISTULA SUPERFICIALIZATION Right 08/25/2018   Procedure: FISTULA SUPERFICIALIZATION RIGHT ARM;  Surgeon: Waynetta Sandy, MD;  Location: [facility];  Service: Cardiovascular;  Laterality: Right;   IR FLUORO GUIDE CV LINE RIGHT  05/31/2018    IR US GUIDE VASC ACCESS RIGHT  05/31/2018   KNEE SURGERY     right knee @ 65 years of age   LEFT AND RIGHT HEART CATHETERIZATION WITH CORONARY ANGIOGRAM N/A 09/21/2011   Procedure: LEFT AND RIGHT HEART CATHETERIZATION WITH CORONARY ANGIOGRAM;  Surgeon: Birdie Riddle, MD;  Location: [place] C[facility];  Service: Cardiovascular;  Laterality: N/A;   POLYPECTOMY  07/08/2020   Procedure: POLYPECTOMY INTESTINAL;  Surgeon: Daneil Dolin, MD;  [facility];  Service: Endoscopy;;    MEDICATIONS: No current facility-administered medications for this encounter.    acetaminophen (TYLENOL) 325 MG tablet   albuterol (PROVENTIL HFA;VENTOLIN HFA) 108 (90 Base) MCG/ACT inhaler   apixaban (ELIQUIS) 5 MG TABS tablet   betamethasone dipropionate (DIPROLENE) 0.05 % ointment   calcitRIOL (ROCALTROL) 0.25 MCG capsule   cetirizine (ZYRTEC) 10 MG tablet   clotrimazole (LOTRIMIN) 1 % cream   DIABETIC TUSSIN EX 100 MG/5ML syrup   diltiazem (CARDIZEM CD) 240 MG 24 hr capsule   fluticasone (FLONASE) 50 MCG/ACT nasal spray   Fluticasone-Umeclidin-Vilant 100-62.5-25 MCG/INH AEPB   furosemide (LASIX) 40 MG tablet   guaiFENesin (MUCINEX) 600 MG 12 hr tablet   HUMULIN R U-500 KWIKPEN 500 UNIT/ML KwikPen   ipratropium-albuterol (DUONEB) 0.5-2.5 (3) MG/3ML SOLN   isosorbide dinitrate (ISORDIL) 10 MG tablet   losartan (COZAAR) 25 MG tablet   metolazone (ZAROXOLYN) 5 MG tablet   metoprolol tartrate (LOPRESSOR) 50 MG tablet   montelukast (SINGULAIR) 10 MG tablet   polyethylene glycol powder (GLYCOLAX/MIRALAX) 17 GM/SCOOP powder   potassium chloride SA (KLOR-CON) 20 MEQ tablet   rosuvastatin (CRESTOR) 20 MG tablet   triamcinolone cream (KENALOG) 0.5 %   Vitamin D, Cholecalciferol, 25 MCG (1000 UT) TABS   DROPSAFE SAFETY PEN NEEDLES 31G X 6 MM MISC   glucose blood (EASYMAX TEST) test strip   Lancets 28G MISC    Myra Gianotti, PA-C Surgical Short Stay/Anesthesiology [facility] Phone [phone] [facility] Phone [phone] 11/28/2020 3:25 PM

## 2020-11-29 DIAGNOSIS — D3A090 Benign carcinoid tumor of the bronchus and lung: Secondary | ICD-10-CM | POA: Insufficient documentation

## 2020-11-29 DIAGNOSIS — C3492 Malignant neoplasm of unspecified part of left bronchus or lung: Secondary | ICD-10-CM | POA: Insufficient documentation

## 2020-11-29 DIAGNOSIS — J988 Other specified respiratory disorders: Secondary | ICD-10-CM | POA: Insufficient documentation

## 2020-12-03 DIAGNOSIS — U071 COVID-19: Secondary | ICD-10-CM | POA: Diagnosis not present

## 2020-12-03 DIAGNOSIS — Z20828 Contact with and (suspected) exposure to other viral communicable diseases: Secondary | ICD-10-CM | POA: Diagnosis not present

## 2020-12-09 DIAGNOSIS — U071 COVID-19: Secondary | ICD-10-CM | POA: Diagnosis not present

## 2020-12-09 DIAGNOSIS — Z20828 Contact with and (suspected) exposure to other viral communicable diseases: Secondary | ICD-10-CM | POA: Diagnosis not present

## 2020-12-11 ENCOUNTER — Encounter: Payer: Self-pay | Admitting: "Endocrinology

## 2020-12-11 ENCOUNTER — Encounter (HOSPITAL_COMMUNITY): Payer: Self-pay | Admitting: Pulmonary Disease

## 2020-12-11 ENCOUNTER — Other Ambulatory Visit: Payer: Self-pay

## 2020-12-11 ENCOUNTER — Ambulatory Visit (INDEPENDENT_AMBULATORY_CARE_PROVIDER_SITE_OTHER): Payer: Medicare Other | Admitting: "Endocrinology

## 2020-12-11 VITALS — BP 122/66 | HR 64 | Ht 63.0 in | Wt 254.4 lb

## 2020-12-11 DIAGNOSIS — E782 Mixed hyperlipidemia: Secondary | ICD-10-CM | POA: Diagnosis not present

## 2020-12-11 DIAGNOSIS — N184 Chronic kidney disease, stage 4 (severe): Secondary | ICD-10-CM

## 2020-12-11 DIAGNOSIS — I10 Essential (primary) hypertension: Secondary | ICD-10-CM

## 2020-12-11 DIAGNOSIS — E1122 Type 2 diabetes mellitus with diabetic chronic kidney disease: Secondary | ICD-10-CM | POA: Diagnosis not present

## 2020-12-11 DIAGNOSIS — Z794 Long term (current) use of insulin: Secondary | ICD-10-CM | POA: Diagnosis not present

## 2020-12-11 LAB — POCT GLYCOSYLATED HEMOGLOBIN (HGB A1C): HbA1c, POC (controlled diabetic range): 6.5 % (ref 0.0–7.0)

## 2020-12-11 NOTE — Progress Notes (Signed)
12/11/2020  Endocrinology follow-up note   Subjective:    Patient ID: [name], female    DOB: 1955-08-02, PCP Rosita Fire, MD   Past Medical History:  Diagnosis Date   Arthritis    Asthma    Atrial fibrillation ([place])    Carcinoid tumor determined by biopsy of lung 05/2018   Left lung   Chronic diastolic CHF (congestive heart failure) ([place])    CKD (chronic kidney disease), stage III ([place])    Diabetes mellitus type 2 in obese [facility])    Essential hypertension    Iron deficiency anemia 10/16/2010   Mental handicap 10/16/2010   Mild CAD 2013   Morbid obesity ([place])    OSA (obstructive sleep apnea)    Past Surgical History:  Procedure Laterality Date   ABDOMINAL HYSTERECTOMY     AV FISTULA PLACEMENT Right 06/06/2018   Procedure: ARTERIOVENOUS (AV) FISTULA CREATION;  Surgeon: Marty Heck, MD;  Location: [facility] OR;  Service: Vascular;  Laterality: Right;   CESAREAN SECTION     CHOLECYSTECTOMY     COLONOSCOPY  08/2010   normal TI, sigmoid polyp (adenoma ). Next TCS due  08/2015,   COLONOSCOPY WITH PROPOFOL N/A 07/08/2020   Procedure: COLONOSCOPY WITH PROPOFOL;  Surgeon: Daneil Dolin, MD;  Location: [facility] ENDO SU[facility];  Service: Endoscopy;  Laterality: N/A;  AM (diabetic and facility patient)   ESOPHAGOGASTRODUODENOSCOPY  08/2010   antral and duodenal erosions s/p bx (chronic gastritis, no h.pylori, no celiac dz ), hiatal hernia   FISTULA SUPERFICIALIZATION Right 08/25/2018   Procedure: FISTULA SUPERFICIALIZATION RIGHT ARM;  Surgeon: Waynetta Sandy, MD;  Location: [facility];  Service: Cardiovascular;  Laterality: Right;   IR FLUORO GUIDE CV LINE RIGHT  05/31/2018   IR US GUIDE VASC ACCESS RIGHT  05/31/2018   KNEE SURGERY     right knee @ 65 years of age   LEFT AND RIGHT HEART CATHETERIZATION WITH CORONARY ANGIOGRAM N/A 09/21/2011   Procedure: LEFT AND RIGHT HEART CATHETERIZATION WITH CORONARY ANGIOGRAM;  Surgeon: Birdie Riddle, MD;  Location: [facility] CATH LAB;  Service:  Cardiovascular;  Laterality: N/A;   POLYPECTOMY  07/08/2020   Procedure: POLYPECTOMY INTESTINAL;  Surgeon: Daneil Dolin, MD;  [facility];  Service: Endoscopy;;   Social History   Socioeconomic History   Marital status: Divorced    Spouse name: Not on file   Number of children: 1   Years of education: Not on file   Highest education level: Not on file  Occupational History    Employer: UNEMPLOYED  Tobacco Use   Smoking status: Former    Packs/day: 1.00    Years: 0.00    Pack years: 0.00    Types: Cigarettes    Quit date: 04/11/1996    Years since quitting: 24.6   Smokeless tobacco: Never   Tobacco comments:    quit a couple year ago  Vaping Use   Vaping Use: Never used  Substance and Sexual Activity   Alcohol use: No   Drug use: No   Sexual activity: Never  Other Topics Concern   Not on file  Social History Narrative   Lives with parents.    Social Determinants of Health   Financial Resource Strain: Not on file  Food Insecurity: Not on file  Transportation Needs: Not on file  Physical Activity: Not on file  Stress: Not on file  Social Connections: Not on file   Outpatient Encounter Medications as of 12/11/2020  Medication Sig  acetaminophen (TYLENOL) 325 MG tablet Take 325 mg by mouth every 12 (twelve) hours as needed for moderate pain.   albuterol (PROVENTIL HFA;VENTOLIN HFA) 108 (90 Base) MCG/ACT inhaler Inhale 2 puffs into the lungs every 6 (six) hours as needed for shortness of breath.   apixaban (ELIQUIS) 5 MG TABS tablet Take 1 tablet (5 mg total) by mouth 2 (two) times daily.   betamethasone dipropionate (DIPROLENE) 0.05 % ointment Apply 1 application topically daily as needed (for rash under breasts).    calcitRIOL (ROCALTROL) 0.25 MCG capsule Take 0.25 mcg by mouth every Monday, Wednesday, and Friday.   cetirizine (ZYRTEC) 10 MG tablet Take 10 mg by mouth daily. (0800)   clotrimazole (LOTRIMIN) 1 % cream Apply 1 application topically daily as  needed (under breasts as needed for rash).   DIABETIC TUSSIN EX 100 MG/5ML syrup Take 100 mg by mouth every 4 (four) hours as needed for cough or congestion.    diltiazem (CARDIZEM CD) 240 MG 24 hr capsule Take 1 capsule (240 mg total) by mouth daily. (Patient taking differently: Take 240 mg by mouth daily. (0800))   DROPSAFE SAFETY PEN NEEDLES 31G X 6 MM MISC    fluticasone (FLONASE) 50 MCG/ACT nasal spray Place 2 sprays into both nostrils daily as needed for allergies or rhinitis.   Fluticasone-Umeclidin-Vilant 100-62.5-25 MCG/INH AEPB Inhale 1 puff into the lungs daily. (0800)   furosemide (LASIX) 40 MG tablet Take 40 mg by mouth See admin instructions. Take 40 mg twice daily, alternating with 40 mg daily every other day   glucose blood (EASYMAX TEST) test strip CHECK BLOOD SUGAR 4 TIMES A DAY. (BREAKFAST, LUNCH, SUPPER & BEDTIME)   guaiFENesin (MUCINEX) 600 MG 12 hr tablet Take 1 tablet (600 mg total) by mouth 2 (two) times daily as needed. (Patient taking differently: Take 600 mg by mouth 2 (two) times daily as needed (cough/congestion).)   HUMULIN R U-500 KWIKPEN 500 UNIT/ML KwikPen INJECT 40 UNITS SUBCUTANEOUSLY AT BREAKFAST AND SUPPER: INJECT 30 UNITS AT LUNCH: ONLY WHEN GLUCOSE IS ABOVE 90 & EATING.   ipratropium-albuterol (DUONEB) 0.5-2.5 (3) MG/3ML SOLN Take 3 mLs by nebulization every 6 (six) hours as needed (for cough).    isosorbide dinitrate (ISORDIL) 10 MG tablet Take 1 tablet (10 mg total) by mouth 3 (three) times daily. (Patient taking differently: Take 10 mg by mouth 3 (three) times daily. (0800, 1400 & 2000))   Lancets 28G MISC CHECK BLOOD SUGAR 4 TIMES A DAY. (BREAKFAST, LUNCH, SUPPER, & BEDTIME)   losartan (COZAAR) 25 MG tablet Take 25 mg by mouth daily.   metolazone (ZAROXOLYN) 5 MG tablet Take 5 mg by mouth in the morning. (0800)   metoprolol tartrate (LOPRESSOR) 50 MG tablet Take 50 mg by mouth 2 (two) times daily. (0800 & 2000)   montelukast (SINGULAIR) 10 MG tablet Take 10  mg by mouth at bedtime. (2000)   polyethylene glycol powder (GLYCOLAX/MIRALAX) 17 GM/SCOOP powder Take 17 g by mouth daily as needed for mild constipation or moderate constipation.   potassium chloride SA (KLOR-CON) 20 MEQ tablet Take 20 mEq by mouth 3 (three) times daily. (0800, 1400 & 2000)   rosuvastatin (CRESTOR) 20 MG tablet TAKE (1) TABLET BY MOUTH ONCE DAILY. (Patient taking differently: Take 20 mg by mouth daily at 8 pm. (2000))   triamcinolone cream (KENALOG) 0.5 % Apply 1 application topically every 12 (twelve) hours as needed (applied to bilateral legs for allergic dermatitis).   Vitamin D, Cholecalciferol, 25 MCG (1000 UT) TABS  Take 2,000 Units by mouth daily. (0800)   No facility-administered encounter medications on file as of 12/11/2020.   ALLERGIES: No Known Allergies VACCINATION STATUS: Immunization History  Administered Date(s) Administered   Hepatitis B, adult 07/29/2018, 08/29/2018, 10/07/2018   Influenza Inj Mdck Quad Pf 12/19/2016   Influenza,inj,Quad PF,6+ Mos 02/02/2013, 05/15/2018   Influenza-Unspecified 05/15/2018   PPD Test 07/22/2018   Pneumococcal Polysaccharide-23 05/15/2018   Pneumococcal-Unspecified 05/15/2018    Diabetes She presents for her follow-up diabetic visit. She has type 2 diabetes mellitus. Onset time: she was diagnosed at approximate age of 50 years. Her disease course has been stable. There are no hypoglycemic associated symptoms. Pertinent negatives for hypoglycemia include no confusion, headaches, pallor or seizures. Pertinent negatives for diabetes include no blurred vision, no chest pain, no fatigue, no polydipsia, no polyphagia and no polyuria. There are no hypoglycemic complications. Symptoms are stable. Diabetic complications include nephropathy. Risk factors for coronary artery disease include diabetes mellitus, dyslipidemia, hypertension, obesity and sedentary lifestyle. Current diabetic treatment includes intensive insulin program. Her  weight is fluctuating minimally. She is following a generally unhealthy diet. She has had a previous visit with a dietitian. She never participates in exercise. Her home blood glucose trend is fluctuating minimally. Her breakfast blood glucose range is generally 140-180 mg/dl. Her lunch blood glucose range is generally 140-180 mg/dl. Her dinner blood glucose range is generally 140-180 mg/dl. Her bedtime blood glucose range is generally 140-180 mg/dl. Her overall blood glucose range is 140-180 mg/dl. (She is accompanied by her aide from nursing home.  She presents with continued improvement in her glycemic profile.  Her point-of-care A1c is 6.5%, improving from 7.4% during her last visit.  She did not have any major hypoglycemia documented or reported.   ) An ACE inhibitor/angiotensin II receptor blocker is being taken.  Hypertension This is a chronic problem. The current episode started more than 1 year ago. The problem is uncontrolled. Pertinent negatives include no blurred vision, chest pain, headaches, palpitations or shortness of breath. Risk factors for coronary artery disease include diabetes mellitus, dyslipidemia, obesity and sedentary lifestyle. Past treatments include ACE inhibitors. Hypertensive end-organ damage includes kidney disease. Identifiable causes of hypertension include chronic renal disease.  Hyperlipidemia This is a chronic problem. The current episode started more than 1 year ago. The problem is uncontrolled. Exacerbating diseases include chronic renal disease, diabetes and obesity. Pertinent negatives include no chest pain, myalgias or shortness of breath. Current antihyperlipidemic treatment includes statins and fibric acid derivatives. Risk factors for coronary artery disease include diabetes mellitus, dyslipidemia, obesity, a sedentary lifestyle and hypertension.   Review of Systems  Constitutional:  Negative for chills, fatigue and fever.  HENT:  Negative for trouble swallowing  and voice change.   Eyes:  Negative for blurred vision and visual disturbance.  Respiratory:  Negative for cough, shortness of breath and wheezing.   Cardiovascular:  Negative for chest pain, palpitations and leg swelling.  Gastrointestinal:  Negative for diarrhea, nausea and vomiting.  Endocrine: Negative for cold intolerance, heat intolerance, polydipsia, polyphagia and polyuria.  Musculoskeletal:  Positive for gait problem. Negative for arthralgias and myalgias.  Skin:  Negative for color change, pallor, rash and wound.  Neurological:  Negative for seizures and headaches.  Hematological:  Does not bruise/bleed easily.  Psychiatric/Behavioral:  Negative for confusion and suicidal ideas.    Objective:    BP 122/66   Pulse 64   Ht 5\' 3"  (1.6 m)   Wt 254 lb 6.4 oz (115.4 kg)  BMI 45.06 kg/m   Wt Readings from Last 3 Encounters:  12/11/20 254 lb 6.4 oz (115.4 kg)  11/15/20 258 lb (117 kg)  10/16/20 256 lb 9.6 oz (116.4 kg)    Physical Exam Constitutional:      Appearance: She is well-developed.  HENT:     Head: Normocephalic and atraumatic.  Neck:     Thyroid: No thyromegaly.     Trachea: No tracheal deviation.  Cardiovascular:     Rate and Rhythm: Normal rate.  Pulmonary:     Effort: Pulmonary effort is normal.  Abdominal:     Tenderness: There is no abdominal tenderness. There is no guarding.  Musculoskeletal:     Cervical back: Normal range of motion and neck supple.  Skin:    General: Skin is dry.     Coloration: Skin is not pale.     Findings: No erythema or rash.  Neurological:     Mental Status: She is alert and oriented to person, place, and time.     Cranial Nerves: No cranial nerve deficit.     Coordination: Coordination normal.  Psychiatric:        Judgment: Judgment normal.    Results for orders placed or performed in visit on 12/11/20  HgB A1c  Result Value Ref Range   Hemoglobin A1C     HbA1c POC (<> result, manual entry)     HbA1c, POC  (prediabetic range)     HbA1c, POC (controlled diabetic range) 6.5 0.0 - 7.0 %   Complete Blood Count (Most recent): Lab Results  Component Value Date   WBC 8.2 06/04/2020   HGB 9.8 (L) 06/04/2020   HCT 31.6 (L) 06/04/2020   MCV 86.3 06/04/2020   PLT 250 06/04/2020   Chemistry (most recent): Diabetic Labs (most recent): Lab Results  Component Value Date   HGBA1C 6.5 12/11/2020   HGBA1C 7.4 05/07/2020   HGBA1C 7.6 (A) 04/25/2020   Lipid Panel     Component Value Date/Time   CHOL 127 05/07/2020 0000   CHOL 128 04/23/2020 0820   TRIG 150 05/07/2020 0000   HDL 42 05/07/2020 0000   HDL 43 04/23/2020 0820   CHOLHDL 3.0 04/23/2020 0820   CHOLHDL 5.5 (H) 09/12/2019 0831   VLDL 59 (H) 06/02/2018 0029   LDLCALC 62 05/07/2020 0000   LDLCALC 60 04/23/2020 0820   LDLCALC 124 (H) 09/12/2019 0831    Assessment & Plan:   1. Type 2 diabetes mellitus with stage 3-4 chronic kidney disease, with long-term current use of insulin   -Her diabetes is  complicated by CKD, congestive heart failure,  obesity/sedentary life, and patient remains at extremely high risk for more acute and chronic complications of diabetes which include CAD, CVA, CKD, retinopathy, and neuropathy. These are all discussed in detail with the patient.  She is accompanied by her aide from nursing home.  She presents with continued improvement in her glycemic profile.  Her point-of-care A1c is 6.5%, improving from 7.4% during her last visit.  She did not have any major hypoglycemia documented or reported.   - I have re-counseled the patient on diet management and weight loss  by adopting a carbohydrate restricted / protein rich  Diet.  - she acknowledges that there is a room for improvement in her food and drink choices. - Suggestion is made for her to avoid simple carbohydrates  from her diet including Cakes, Sweet Desserts, Ice Cream, Soda (diet and regular), Sweet Tea, Candies, Chips, Cookies, Store Bought  Juices,  Alcohol in Excess of  1-2 drinks a day, Artificial Sweeteners,  Coffee Creamer, and "Sugar-free" Products, Lemonade. This will help patient to have more stable blood glucose profile and potentially avoid unintended weight gain.   - Patient is advised to stick to a routine mealtimes to eat 3 meals  a day and avoid unnecessary snacks ( to snack only to correct hypoglycemia).  - I have approached patient with the following individualized plan to manage diabetes and patient agrees.  -Her placement in group home is the best development for her lately.  -Her presenting glycemic profile is near or on target, with point-of-care A1c of 6.5%.     -It is better for her to stay on Humulin RU 500 insulin for the sake of simplicity.  -She is advised to continue  Humulin U500  40 units with breakfast, 30 units with lunch, and 40 units with supper  when glucose is above 90 mg/dl associated with monitoring of blood glucose before meals and at bedtime. - She is advised to skip insulin if pre-meal blood glucose is below 90 mg/dL or if she is not eating. -She is not a candidate for metformin.   - Patient specific target  for A1c; LDL, HDL, Triglycerides, were discussed in detail.  2) BP/HTN:  -Her blood pressure is controlled to target.  She has urine microalbuminuria .She is advised on  salt restriction  and I advised her to continue current medications including lisinopril 20 mg p.o. daily, hydralazine 25 mg p.o. 3 times daily, furosemide 40 mg p.o. Daily.  3) Lipids/HPL: Her recent lipid panel showed loss of control of dyslipidemia, LDL at 124 increasing from 80.  She is tolerating Crestor, advised to continue Crestor 20 mg p.o. nightly.   She is also on fenofibrate.     4)  Weight/Diet: Her BMI is 73.53-- -clearly complicating her diabetes care.   She is a candidate for modest weight loss.  CDE consult in progress, exercise, and carbohydrates information provided.  5) Chronic Care/Health  Maintenance:  -Patient is  on ACEI and encouraged to continue to follow up with Ophthalmology, Podiatrist at least yearly or according to recommendations, and advised to stay away from smoking. I have recommended yearly flu vaccine and pneumonia vaccination at least every 5 years;  and  sleep for at least 7 hours a day.    -She is advised to maintain close follow-up with her nephrologist.  - I advised patient to maintain close follow up with Rosita Fire, MD for primary care needs.   I spent 41 minutes in the care of the patient today including review of labs from [facility], Lipids, Thyroid Function, Hematology (current and previous including abstractions from other facilities); face-to-face time discussing  her blood glucose readings/logs, discussing hypoglycemia and hyperglycemia episodes and symptoms, medications doses, her options of short and long term treatment based on the latest standards of care / guidelines;  discussion about incorporating lifestyle medicine;  and documenting the encounter.    Please refer to Patient Instructions for Blood Glucose Monitoring and Insulin/Medications Dosing Guide"  in media tab for additional information. Please  also refer to " Patient Self Inventory" in the Media  tab for reviewed elements of pertinent patient history.  [name] participated in the discussions, expressed understanding, and voiced agreement with the above plans.  All questions were answered to her satisfaction. she is encouraged to contact clinic should she have any questions or concerns prior to her return visit.  Follow up plan: -Return in about 4 months (around 04/12/2021) for Bring Meter and Logs- A1c in Office.  Glade Lloyd, MD Phone: [phone]  Fax: [phone]  This note was partially dictated with voice recognition software. Similar sounding words can be transcribed inadequately or may not  be corrected upon review.  12/11/2020, 9:44 AM

## 2020-12-11 NOTE — Progress Notes (Addendum)
Gillis Santa, RN  Registered Nurse    Progress Notes     Signed  Date of Service:  11/27/2020 11:49 AM           Signed                     Surgical Instructions                 Your procedure is scheduled on 12/13/2020.             Report to [facility] Main Entrance "A" at 0700 A.M., then check in with the Admitting office.             Call this number if you have problems the morning of surgery:             [phone]    If you have any questions prior to your surgery date call [phone]: Open Monday-Friday 8am-4pm                 Remember:             Do not eat after midnight the night before your surgery.                          Take these medicines the morning of surgery with A SIP OF WATER  Zyrtec, cardizem, fluticasone-umeclidin-vilent, isordil, losartan, metoprolol tartrate, rosuvastatin.  As needed: tylenol, Albuterol, mucinex, duoneb   Day of surgery do not take Humulin 500 unless blood sugar is greater than 220 then take half of usual dose  Upon awakening day of surgery check your blood sugar. As of today, STOP taking any Aspirin (unless otherwise instructed by your surgeon) Aleve, Naproxen, Ibuprofen, Motrin, Advil, Goody's, BC's, all herbal medications, fish oil, and all vitamins.  Last dose of Eliquis was 12/10/2020.          Do not wear jewelry or makeup Do not wear lotions, powders, perfumes/colognes, or deodorant. Do not shave 48 hours prior to surgery.  Men may shave face and neck. Do not bring valuables to the hospital. DO Not wear nail polish, gel polish, artificial nails, or any other type of covering on       natural nails including finger and toenails. If patients have artificial nails, gel coating, etc. that need to be removed by a nail salon please have this removed prior to surgery or surgery may need to be canceled/delayed if the surgeon/ anesthesia feels like the patient is unable to be adequately monitored.              [facility] is not responsible for any belongings or valuables.   Do NOT Smoke (Tobacco/Vaping) or drink Alcohol 24 hours prior to your procedure If you use a CPAP at night, you may bring all equipment for your overnight stay.   Contacts, glasses, dentures or bridgework may not be worn into surgery, please bring cases for these belongings   For patients admitted to the hospital, discharge time will be determined by your treatment team.   Patients discharged the day of surgery will not be allowed to drive home, and someone needs to stay with them for 24 hours.   ONLY 1 SUPPORT PERSON MAY BE PRESENT WHILE YOU ARE IN SURGERY. IF YOU ARE TO BE ADMITTED ONCE YOU ARE IN YOUR ROOM YOU WILL BE ALLOWED TWO (2) VISITORS.  Minor children may have two parents present. Special consideration  for safety and communication needs will be reviewed on a case by case basis.   Special instructions:     Oral Hygiene is also important to reduce your risk of infection.  Remember - BRUSH YOUR TEETH THE MORNING OF SURGERY WITH YOUR REGULAR TOOTHPaste                               Note Details  Author Gillis Santa, RN File Time 11/27/2020 12:01 PM  Author Type Registered Nurse Status Signed  Last Editor Gillis Santa, RN Service (none)

## 2020-12-11 NOTE — Patient Instructions (Signed)

## 2020-12-13 ENCOUNTER — Ambulatory Visit (HOSPITAL_COMMUNITY): Payer: Medicare Other | Admitting: Vascular Surgery

## 2020-12-13 ENCOUNTER — Encounter (HOSPITAL_COMMUNITY)
Admission: RE | Disposition: A | Discharge status: Home / Self Care | Payer: Self-pay | Source: Home / Self Care | Attending: Pulmonary Disease

## 2020-12-13 ENCOUNTER — Encounter (HOSPITAL_COMMUNITY): Payer: Self-pay | Admitting: Pulmonary Disease

## 2020-12-13 ENCOUNTER — Ambulatory Visit (HOSPITAL_COMMUNITY)
Admission: RE | Admit: 2020-12-13 | Discharge: 2020-12-13 | Disposition: A | Discharge status: Home / Self Care | Payer: Medicare Other | Attending: Pulmonary Disease | Admitting: Pulmonary Disease

## 2020-12-13 ENCOUNTER — Other Ambulatory Visit: Payer: Self-pay

## 2020-12-13 DIAGNOSIS — J45909 Unspecified asthma, uncomplicated: Secondary | ICD-10-CM | POA: Diagnosis not present

## 2020-12-13 DIAGNOSIS — J988 Other specified respiratory disorders: Secondary | ICD-10-CM | POA: Diagnosis not present

## 2020-12-13 DIAGNOSIS — Z7901 Long term (current) use of anticoagulants: Secondary | ICD-10-CM | POA: Diagnosis not present

## 2020-12-13 DIAGNOSIS — Z841 Family history of disorders of kidney and ureter: Secondary | ICD-10-CM | POA: Insufficient documentation

## 2020-12-13 DIAGNOSIS — D3A090 Benign carcinoid tumor of the bronchus and lung: Secondary | ICD-10-CM | POA: Diagnosis not present

## 2020-12-13 DIAGNOSIS — Z794 Long term (current) use of insulin: Secondary | ICD-10-CM | POA: Diagnosis not present

## 2020-12-13 DIAGNOSIS — C3492 Malignant neoplasm of unspecified part of left bronchus or lung: Secondary | ICD-10-CM

## 2020-12-13 DIAGNOSIS — I130 Hypertensive heart and chronic kidney disease with heart failure and stage 1 through stage 4 chronic kidney disease, or unspecified chronic kidney disease: Secondary | ICD-10-CM | POA: Diagnosis not present

## 2020-12-13 DIAGNOSIS — I4891 Unspecified atrial fibrillation: Secondary | ICD-10-CM | POA: Diagnosis not present

## 2020-12-13 DIAGNOSIS — Z87891 Personal history of nicotine dependence: Secondary | ICD-10-CM | POA: Diagnosis not present

## 2020-12-13 DIAGNOSIS — N183 Chronic kidney disease, stage 3 unspecified: Secondary | ICD-10-CM | POA: Diagnosis not present

## 2020-12-13 DIAGNOSIS — N186 End stage renal disease: Secondary | ICD-10-CM | POA: Diagnosis not present

## 2020-12-13 DIAGNOSIS — Z992 Dependence on renal dialysis: Secondary | ICD-10-CM | POA: Diagnosis not present

## 2020-12-13 DIAGNOSIS — C7A090 Malignant carcinoid tumor of the bronchus and lung: Secondary | ICD-10-CM

## 2020-12-13 DIAGNOSIS — I5032 Chronic diastolic (congestive) heart failure: Secondary | ICD-10-CM | POA: Diagnosis not present

## 2020-12-13 DIAGNOSIS — G4733 Obstructive sleep apnea (adult) (pediatric): Secondary | ICD-10-CM | POA: Insufficient documentation

## 2020-12-13 DIAGNOSIS — Z7951 Long term (current) use of inhaled steroids: Secondary | ICD-10-CM | POA: Insufficient documentation

## 2020-12-13 DIAGNOSIS — Z8249 Family history of ischemic heart disease and other diseases of the circulatory system: Secondary | ICD-10-CM | POA: Diagnosis not present

## 2020-12-13 DIAGNOSIS — I132 Hypertensive heart and chronic kidney disease with heart failure and with stage 5 chronic kidney disease, or end stage renal disease: Secondary | ICD-10-CM | POA: Diagnosis not present

## 2020-12-13 DIAGNOSIS — Z79899 Other long term (current) drug therapy: Secondary | ICD-10-CM | POA: Diagnosis not present

## 2020-12-13 DIAGNOSIS — Z6841 Body Mass Index (BMI) 40.0 and over, adult: Secondary | ICD-10-CM | POA: Diagnosis not present

## 2020-12-13 DIAGNOSIS — Z833 Family history of diabetes mellitus: Secondary | ICD-10-CM | POA: Insufficient documentation

## 2020-12-13 DIAGNOSIS — E1122 Type 2 diabetes mellitus with diabetic chronic kidney disease: Secondary | ICD-10-CM | POA: Insufficient documentation

## 2020-12-13 HISTORY — PX: BRONCHIAL DILITATION: SHX5107

## 2020-12-13 HISTORY — PX: VIDEO BRONCHOSCOPY: SHX5072

## 2020-12-13 LAB — CBC
HCT: 34.5 % — ABNORMAL LOW (ref 36.0–46.0)
Hemoglobin: 10.6 g/dL — ABNORMAL LOW (ref 12.0–15.0)
MCH: 26.4 pg (ref 26.0–34.0)
MCHC: 30.7 g/dL (ref 30.0–36.0)
MCV: 86 fL (ref 80.0–100.0)
Platelets: 238 10*3/uL (ref 150–400)
RBC: 4.01 MIL/uL (ref 3.87–5.11)
RDW: 16.8 % — ABNORMAL HIGH (ref 11.5–15.5)
WBC: 8 10*3/uL (ref 4.0–10.5)
nRBC: 0 % (ref 0.0–0.2)

## 2020-12-13 LAB — GLUCOSE, CAPILLARY
Glucose-Capillary: 170 mg/dL — ABNORMAL HIGH (ref 70–99)
Glucose-Capillary: 181 mg/dL — ABNORMAL HIGH (ref 70–99)

## 2020-12-13 SURGERY — VIDEO BRONCHOSCOPY WITHOUT FLUORO
Anesthesia: General | Laterality: Left

## 2020-12-13 MED ORDER — PROPOFOL 10 MG/ML IV BOLUS
INTRAVENOUS | Status: DC | PRN
Start: 2020-12-13 — End: 2020-12-13
  Administered 2020-12-13: 200 mg via INTRAVENOUS

## 2020-12-13 MED ORDER — ROCURONIUM BROMIDE 10 MG/ML (PF) SYRINGE
PREFILLED_SYRINGE | INTRAVENOUS | Status: DC | PRN
  Administered 2020-12-13: 50 mg via INTRAVENOUS

## 2020-12-13 MED ORDER — SUGAMMADEX SODIUM 200 MG/2ML IV SOLN
INTRAVENOUS | Status: DC | PRN
  Administered 2020-12-13: 400 mg via INTRAVENOUS

## 2020-12-13 MED ORDER — ONDANSETRON HCL 4 MG/2ML IJ SOLN: 4.0000 mg | Freq: Once | INTRAMUSCULAR | Status: DC | PRN

## 2020-12-13 MED ORDER — FENTANYL CITRATE (PF) 100 MCG/2ML IJ SOLN: 25.0000 ug | INTRAMUSCULAR | Status: DC | PRN

## 2020-12-13 MED ORDER — ONDANSETRON HCL 4 MG/2ML IJ SOLN
INTRAMUSCULAR | Status: DC | PRN
  Administered 2020-12-13: 4 mg via INTRAVENOUS

## 2020-12-13 MED ORDER — LIDOCAINE 2% (20 MG/ML) 5 ML SYRINGE
INTRAMUSCULAR | Status: DC | PRN
  Administered 2020-12-13: 100 mg via INTRAVENOUS

## 2020-12-13 MED ORDER — SODIUM CHLORIDE 0.9 % IV SOLN: INTRAVENOUS | Status: DC

## 2020-12-13 MED ORDER — LACTATED RINGERS IV SOLN: INTRAVENOUS | Status: DC | PRN

## 2020-12-13 NOTE — Interval H&P Note (Signed)
History and Physical Interval Note:  12/13/2020 10:06 AM  [name]  has presented today for surgery, with the diagnosis of Endobronchial cancer.  The various methods of treatment have been discussed with the patient and family. After consideration of risks, benefits and other options for treatment, the patient has consented to  Procedure(s) with comments: VIDEO BRONCHOSCOPY WITHOUT FLUORO (Left) - Cryotherapy as a surgical intervention.  The patient's history has been reviewed, patient examined, no change in status, stable for surgery.  I have reviewed the patient's chart and labs.  Questions were answered to the patient's satisfaction.     [place]

## 2020-12-13 NOTE — Anesthesia Postprocedure Evaluation (Signed)
Anesthesia Post Note  Patient: [name]  Procedure(s) Performed: VIDEO BRONCHOSCOPY WITHOUT FLUORO (Left) BRONCHIAL DILITATION     Patient location during evaluation: PACU Anesthesia Type: General Level of consciousness: awake and alert Pain management: pain level controlled Vital Signs Assessment: post-procedure vital signs reviewed and stable Respiratory status: spontaneous breathing, nonlabored ventilation and respiratory function stable Cardiovascular status: blood pressure returned to baseline and stable Postop Assessment: no apparent nausea or vomiting Anesthetic complications: no   No notable events documented.  Last Vitals:  Vitals:   12/13/20 1110 12/13/20 1125  BP: (!) 119/98 140/84  Pulse: (!) 48 79  Resp: 15   Temp:  (!) 36.3 C  SpO2: 99% 98%    Last Pain:  Vitals:   12/13/20 1125  TempSrc:   PainSc: 0-No pain                 Catalina Gravel

## 2020-12-13 NOTE — Anesthesia Procedure Notes (Signed)
Procedure Name: Intubation Date/Time: 12/13/2020 10:18 AM Performed by: Geraldine Contras, CRNA Pre-anesthesia Checklist: Patient identified, Patient being monitored, Timeout performed, Emergency Drugs available and Suction available Patient Re-evaluated:Patient Re-evaluated prior to induction Oxygen Delivery Method: Circle system utilized Preoxygenation: Pre-oxygenation with 100% oxygen Induction Type: IV induction Ventilation: Mask ventilation without difficulty Laryngoscope Size: Mac and 3 Grade View: Grade I Tube type: Oral Tube size: 8.0 mm Number of attempts: 1 Airway Equipment and Method: Stylet Placement Confirmation: ETT inserted through vocal cords under direct vision, positive ETCO2 and breath sounds checked- equal and bilateral Secured at: 22 cm Tube secured with: Tape Dental Injury: Teeth and Oropharynx as per pre-operative assessment

## 2020-12-13 NOTE — Op Note (Addendum)
Video Bronchoscopy Procedure Note  Date of Operation: 12/13/2020  Pre-op Diagnosis: LLL carcinoid   Post-op Diagnosis: LLL carcinoid  Surgeon: Garner Nash, DO   Assistants: none  Anesthesia: general   Operation: Flexible video fiberoptic bronchoscopy and biopsies.  Estimated Blood Loss: <6OM  Complications: none noted  Indications and History: [name] is 65 y.o. with history of known diagnosis left lower lobe carcinoid tumor.  Recommendation was to perform video fiberoptic bronchoscopy with biopsies. The risks, benefits, complications, treatment options and expected outcomes were discussed with the patient.  The possibilities of pneumothorax, pneumonia, reaction to medication, pulmonary aspiration, perforation of a viscus, bleeding, failure to diagnose a condition and creating a complication requiring transfusion or operation were discussed with the patient who freely signed the consent.    Description of Procedure: The patient was seen in the Preoperative Area, was examined and was deemed appropriate to proceed.  The patient was taken to endoscopy room 3, identified as [name] and the procedure verified as Flexible Video Fiberoptic Bronchoscopy.  A Time Out was held and the above information confirmed.   Standard therapeutic bronchoscope was inserted into the patient's endotracheal tube.  Visualization of the main trachea, bilateral right and left lung appeared normal except for the left lower lobe distal subsegment.  Left lower lobe distal subsegment was near 90% occluded from extrinsic compression of the airway secondary to the previous diagnosed carcinoid tumor.  There is a small amount of carcinoid tumor light growth on the mucosal surface of the airway.  But otherwise 90+ percent of the occlusion was related to extrinsic compression of the airway.  We used a [employer] pulmonary balloon to dilate the distal subsegment airway to see if we could visualize  normal airway beyond this.  This was successful and least able to see distally that there was no obvious endobronchial disease and confirming that this was all extrinsic compression.  Unfortunately due to the nature of the tumor and not having defined margins on the endobronchial side we were unable to complete any tumor debulking or removal.  Samples: 1.  No samples taken  Plans:  Outpatient followup will be with Dr. Valeta Harms, DO.   Garner Nash, DO [facility] 12/13/2020 10:53 AM

## 2020-12-13 NOTE — Discharge Instructions (Signed)
Flexible Bronchoscopy, Care After This sheet gives you information about how to care for yourself after your test. Your doctor may also give you more specific instructions. If you have problems or questions, contact your doctor. Follow these instructions at home: Eating and drinking Do not eat or drink anything (not even water) for 2 hours after your test, or until your numbing medicine (local anesthetic) wears off. When your numbness is gone and your cough and gag reflexes have come back, you may: Eat only soft foods. Slowly drink liquids. The day after the test, go back to your normal diet. Driving Do not drive for 24 hours if you were given a medicine to help you relax (sedative). Do not drive or use heavy machinery while taking prescription pain medicine. General instructions  Take over-the-counter and prescription medicines only as told by your doctor. Return to your normal activities as told. Ask what activities are safe for you. Do not use any products that have nicotine or tobacco in them. This includes cigarettes and e-cigarettes. If you need help quitting, ask your doctor. Keep all follow-up visits as told by your doctor. This is important. It is very important if you had a tissue sample (biopsy) taken. Get help right away if: You have shortness of breath that gets worse. You get light-headed. You feel like you are going to pass out (faint). You have chest pain. You cough up: More than a little blood. More blood than before. Summary Do not eat or drink anything (not even water) for 2 hours after your test, or until your numbing medicine wears off. Do not use cigarettes. Do not use e-cigarettes. Get help right away if you have chest pain.  This information is not intended to replace advice given to you by your health care provider. Make sure you discuss any questions you have with your health care provider. Document Released: 01/11/2009 Document Revised: 02/26/2017 Document  Reviewed: 04/03/2016 Elsevier Patient Education  2020 [employer].

## 2020-12-13 NOTE — Transfer of Care (Signed)
Immediate Anesthesia Transfer of Care Note  Patient: [name]  Procedure(s) Performed: VIDEO BRONCHOSCOPY WITHOUT FLUORO (Left) BRONCHIAL DILITATION  Patient Location: PACU  Anesthesia Type:General  Level of Consciousness: awake  Airway & Oxygen Therapy: Patient Spontanous Breathing  Post-op Assessment: Report given to RN  Post vital signs: stable  Last Vitals:  Vitals Value Taken Time  BP 156/87 12/13/20 1053  Temp    Pulse 67 12/13/20 1056  Resp 16 12/13/20 1056  SpO2 99 % 12/13/20 1056  Vitals shown include unvalidated device data.  Last Pain:  Vitals:   12/13/20 0801  TempSrc:   PainSc: 0-No pain         Complications: No notable events documented.

## 2020-12-15 ENCOUNTER — Encounter (HOSPITAL_COMMUNITY): Payer: Self-pay | Admitting: Pulmonary Disease

## 2020-12-16 DIAGNOSIS — I110 Hypertensive heart disease with heart failure: Secondary | ICD-10-CM | POA: Diagnosis not present

## 2020-12-16 DIAGNOSIS — Z20828 Contact with and (suspected) exposure to other viral communicable diseases: Secondary | ICD-10-CM | POA: Diagnosis not present

## 2020-12-16 DIAGNOSIS — U071 COVID-19: Secondary | ICD-10-CM | POA: Diagnosis not present

## 2020-12-16 DIAGNOSIS — E1122 Type 2 diabetes mellitus with diabetic chronic kidney disease: Secondary | ICD-10-CM | POA: Diagnosis not present

## 2020-12-17 ENCOUNTER — Telehealth: Payer: Self-pay

## 2020-12-17 NOTE — Telephone Encounter (Signed)
-----   Message from Garner Nash, DO sent at 12/13/2020 11:19 AM EDT ----- Thurmond Butts, can you schedule with HL to discuss lobectomy. I am not sure she is a perfect candidate but maybe we can give her the benefit of the doubt.   Triage, can you please scheduled PFTs to be complete prior to consult with Dr. Kipp Brood.   Thanks  BLI   Garner Nash, DO [facility] 12/13/2020 11:19 AM

## 2020-12-17 NOTE — Telephone Encounter (Signed)
Patient is scheduled for OV and PFT. Nothing further needed at this time.  Next Appt With Pulmonology Anne Graves Icard, DO)12/18/2020 at 10:00 AM  PFT 01/01/2021 at 11 am

## 2020-12-18 ENCOUNTER — Other Ambulatory Visit: Payer: Self-pay

## 2020-12-18 ENCOUNTER — Ambulatory Visit (INDEPENDENT_AMBULATORY_CARE_PROVIDER_SITE_OTHER): Payer: Medicare Other | Admitting: Pulmonary Disease

## 2020-12-18 ENCOUNTER — Encounter: Payer: Self-pay | Admitting: Pulmonary Disease

## 2020-12-18 VITALS — BP 122/70 | HR 90 | Temp 97.9°F | Ht 63.0 in | Wt 260.0 lb

## 2020-12-18 DIAGNOSIS — J988 Other specified respiratory disorders: Secondary | ICD-10-CM | POA: Diagnosis not present

## 2020-12-18 DIAGNOSIS — C3492 Malignant neoplasm of unspecified part of left bronchus or lung: Secondary | ICD-10-CM | POA: Diagnosis not present

## 2020-12-18 DIAGNOSIS — D3A090 Benign carcinoid tumor of the bronchus and lung: Secondary | ICD-10-CM | POA: Diagnosis not present

## 2020-12-18 NOTE — Patient Instructions (Addendum)
Thank you for visiting Dr. Valeta Harms at [facility]. Today we recommend the following:  Please go to surgical consultation next month  PFTs have already been ordered.   Return in about 6 months (around 06/17/2021).    Please do your part to reduce the spread of COVID-19.

## 2020-12-18 NOTE — Progress Notes (Signed)
Synopsis: Referred in August 2022 for endobronchial carcinoid, by Dr. Sondra Come, PCP: By Rosita Fire, MD  Subjective:   PATIENT ID: [name] GENDER: female DOB: 1955-05-19, MRN: [id number]  Chief Complaint  Patient presents with   Follow-up      This is a 65 year old female, past medical history of asthma, atrial fibrillation, chronic diastolic heart failure, hypertension, morbid obesity, obstructive sleep apnea and carcinoid tumor of the left lung.  Patient was referred to radiation oncology after repeat CT imaging showed possible progression.  Initially presented to the emergency department in February 2020 for community-acquired pneumonia.  CT scan of the chest revealed a masslike consolidation 5 x 8.2 cm over the left lower lobe.  Patient underwent prompt bronchoscopy in February 2020 which revealed an endobronchial lesion that favor typical carcinoid and atypical squamous metaplasia which was favored to be reactive.  There was near total occlusion of the lateral and posterior segment of the left lower lobe visible endobronchial tumor at this time.  Patient underwent pet imaging in September 2020 which showed the solitary well differentiated neuroendocrine tumor within the left infrahilar lung located in the distal aspect of the left mainstem.  There was no additional metastasis found.  Patient had repeat CT scan of the chest in April 2022.  This repeat CT chest revealed a central solid lesion 1.5 x 1.5 within the left lower lobe airway.  Consistent with her history of endobronchial carcinoid.  She was referred to radiation oncology for considerations of treatment.  However discussion at medical thoracic oncology conference understanding that radiation is not always helpful to this tumor type.  Patient was referred to pulmonary for consideration of tumor debulking and endobronchial cryotherapy.  OV 12/18/2020: Here today for follow-up after bronchoscopy discussed details.  Patient had a  known history of carcinoid.  On recent bronchoscopy she has predominantly lower lobe extrinsic compression.  Very little endobronchial disease.  We were able to balloon dilate the distal segment see if we can keep it patent but ultimately I think she needs surgery for removal of this.  I discussed this with our cardiothoracic surgeons.  Post bronchoscopy she did have a few flecks of blood in sputum.  This is now stopped.   Oncology History   No history exists.     Past Medical History:  Diagnosis Date   Arthritis    Asthma    Atrial fibrillation ([place])    Carcinoid tumor determined by biopsy of lung 05/2018   Left lung   Chronic diastolic CHF (congestive heart failure) ([place])    CKD (chronic kidney disease), stage III ([place])    Diabetes mellitus type 2 in obese [facility])    Essential hypertension    Iron deficiency anemia 10/16/2010   Mental handicap 10/16/2010   Mild CAD 2013   Morbid obesity ([facility])    OSA (obstructive sleep apnea)      Family History  Problem Relation Age of Onset   Diabetes Mother    Hypertension Mother    Heart failure Mother    Hypertension Father    Atrial fibrillation Father    Kidney disease Other        son reportedly has had cyst on kidney and lung s/p surgery at [facility]   Hypertension Son    Colon cancer Neg Hx      Past Surgical History:  Procedure Laterality Date   ABDOMINAL HYSTERECTOMY     AV FISTULA PLACEMENT Right 06/06/2018   Procedure: ARTERIOVENOUS (AV) FISTULA CREATION;  Surgeon: Marty Heck, MD;  Location: [place];  Service: Vascular;  Laterality: Right;   BRONCHIAL DILITATION  12/13/2020   Procedure: BRONCHIAL DILITATION;  Surgeon: Garner Nash, DO;  Location: [facility];  Service: Pulmonary;;   CESAREAN SECTION     CHOLECYSTECTOMY     COLONOSCOPY  08/2010   normal TI, sigmoid polyp (adenoma ). Next TCS due  08/2015,   COLONOSCOPY WITH PROPOFOL N/A 07/08/2020   Procedure: COLONOSCOPY WITH PROPOFOL;  Surgeon: Daneil Dolin, MD;   [facility];  Service: Endoscopy;  Laterality: N/A;  AM (diabetic and facility patient)   ESOPHAGOGASTRODUODENOSCOPY  08/2010   antral and duodenal erosions s/p bx (chronic gastritis, no h.pylori, no celiac dz ), hiatal hernia   FISTULA SUPERFICIALIZATION Right 08/25/2018   Procedure: FISTULA SUPERFICIALIZATION RIGHT ARM;  Surgeon: Waynetta Sandy, MD;  Location: [place];  Service: Cardiovascular;  Laterality: Right;   IR FLUORO GUIDE CV LINE RIGHT  05/31/2018   IR US GUIDE VASC ACCESS RIGHT  05/31/2018   KNEE SURGERY     right knee @ 65 years of age   LEFT AND RIGHT HEART CATHETERIZATION WITH CORONARY ANGIOGRAM N/A 09/21/2011   Procedure: LEFT AND RIGHT HEART CATHETERIZATION WITH CORONARY ANGIOGRAM;  Surgeon: Birdie Riddle, MD;  Location: [facility];  Service: Cardiovascular;  Laterality: N/A;   POLYPECTOMY  07/08/2020   Procedure: POLYPECTOMY INTESTINAL;  Surgeon: Daneil Dolin, MD;  [facility];  Service: Endoscopy;;   VIDEO BRONCHOSCOPY Left 12/13/2020   Procedure: VIDEO BRONCHOSCOPY WITHOUT FLUORO;  Surgeon: Garner Nash, DO;  Location: [facility];  Service: Pulmonary;  Laterality: Left;  Cryotherapy    Social History   Socioeconomic History   Marital status: Divorced    Spouse name: Not on file   Number of children: 1   Years of education: Not on file   Highest education level: Not on file  Occupational History    Employer: UNEMPLOYED  Tobacco Use   Smoking status: Former    Packs/day: 1.00    Years: 0.00    Pack years: 0.00    Types: Cigarettes    Quit date: 04/11/1996    Years since quitting: 24.7   Smokeless tobacco: Never   Tobacco comments:    quit a couple year ago  Vaping Use   Vaping Use: Never used  Substance and Sexual Activity   Alcohol use: No   Drug use: No   Sexual activity: Never  Other Topics Concern   Not on file  Social History Narrative   Lives with parents.    Social Determinants of Health   Financial Resource  Strain: Not on file  Food Insecurity: Not on file  Transportation Needs: Not on file  Physical Activity: Not on file  Stress: Not on file  Social Connections: Not on file  Intimate Partner Violence: Not on file     No Known Allergies   Outpatient Medications Prior to Visit  Medication Sig Dispense Refill   acetaminophen (TYLENOL) 325 MG tablet Take 325 mg by mouth every 12 (twelve) hours as needed for moderate pain.     albuterol (PROVENTIL HFA;VENTOLIN HFA) 108 (90 Base) MCG/ACT inhaler Inhale 2 puffs into the lungs every 6 (six) hours as needed for shortness of breath.     apixaban (ELIQUIS) 5 MG TABS tablet Take 1 tablet (5 mg total) by mouth 2 (two) times daily. 60 tablet 0   betamethasone dipropionate (DIPROLENE) 0.05 % ointment Apply  1 application topically daily as needed (for rash under breasts).      calcitRIOL (ROCALTROL) 0.25 MCG capsule Take 0.25 mcg by mouth every Monday, Wednesday, and Friday.     cetirizine (ZYRTEC) 10 MG tablet Take 10 mg by mouth daily. (0800)     clotrimazole (LOTRIMIN) 1 % cream Apply 1 application topically daily as needed (under breasts as needed for rash).     DIABETIC TUSSIN EX 100 MG/5ML syrup Take 100 mg by mouth every 4 (four) hours as needed for cough or congestion.      diltiazem (CARDIZEM CD) 240 MG 24 hr capsule Take 1 capsule (240 mg total) by mouth daily. (Patient taking differently: Take 240 mg by mouth daily. (0800)) 30 capsule 0   DROPSAFE SAFETY PEN NEEDLES 31G X 6 MM MISC      fluticasone (FLONASE) 50 MCG/ACT nasal spray Place 2 sprays into both nostrils daily as needed for allergies or rhinitis.     Fluticasone-Umeclidin-Vilant 100-62.5-25 MCG/INH AEPB Inhale 1 puff into the lungs daily. (0800)     furosemide (LASIX) 40 MG tablet Take 40 mg by mouth See admin instructions. Take 40 mg twice daily, alternating with 40 mg daily every other day     glucose blood (EASYMAX TEST) test strip CHECK BLOOD SUGAR 4 TIMES A DAY. (BREAKFAST, LUNCH,  SUPPER & BEDTIME) 100 strip 5   guaiFENesin (MUCINEX) 600 MG 12 hr tablet Take 1 tablet (600 mg total) by mouth 2 (two) times daily as needed. (Patient taking differently: Take 600 mg by mouth 2 (two) times daily as needed (cough/congestion).) 60 tablet 11   HUMULIN R U-500 KWIKPEN 500 UNIT/ML KwikPen INJECT 40 UNITS SUBCUTANEOUSLY AT BREAKFAST AND SUPPER: INJECT 30 UNITS AT LUNCH: ONLY WHEN GLUCOSE IS ABOVE 90 & EATING. 6 mL 0   ipratropium-albuterol (DUONEB) 0.5-2.5 (3) MG/3ML SOLN Take 3 mLs by nebulization every 6 (six) hours as needed (for cough).      isosorbide dinitrate (ISORDIL) 10 MG tablet Take 1 tablet (10 mg total) by mouth 3 (three) times daily. (Patient taking differently: Take 10 mg by mouth 3 (three) times daily. (0800, 1400 & 2000)) 90 tablet 0   Lancets 28G MISC CHECK BLOOD SUGAR 4 TIMES A DAY. (BREAKFAST, LUNCH, SUPPER, & BEDTIME) 100 each 5   losartan (COZAAR) 25 MG tablet Take 25 mg by mouth daily.     metolazone (ZAROXOLYN) 5 MG tablet Take 5 mg by mouth in the morning. (0800)     metoprolol tartrate (LOPRESSOR) 50 MG tablet Take 50 mg by mouth 2 (two) times daily. (0800 & 2000)     montelukast (SINGULAIR) 10 MG tablet Take 10 mg by mouth at bedtime. (2000)     polyethylene glycol powder (GLYCOLAX/MIRALAX) 17 GM/SCOOP powder Take 17 g by mouth daily as needed for mild constipation or moderate constipation.     potassium chloride SA (KLOR-CON) 20 MEQ tablet Take 20 mEq by mouth 3 (three) times daily. (0800, 1400 & 2000)     rosuvastatin (CRESTOR) 20 MG tablet TAKE (1) TABLET BY MOUTH ONCE DAILY. (Patient taking differently: Take 20 mg by mouth daily at 8 pm. (2000)) 30 tablet 3   triamcinolone cream (KENALOG) 0.5 % Apply 1 application topically every 12 (twelve) hours as needed (applied to bilateral legs for allergic dermatitis).     Vitamin D, Cholecalciferol, 25 MCG (1000 UT) TABS Take 2,000 Units by mouth daily. (0800) 60 tablet 6   No facility-administered medications prior  to visit.  Review of Systems  Constitutional:  Negative for chills, fever, malaise/fatigue and weight loss.  HENT:  Negative for hearing loss, sore throat and tinnitus.   Eyes:  Negative for blurred vision and double vision.  Respiratory:  Positive for cough and shortness of breath. Negative for hemoptysis, sputum production, wheezing and stridor.   Cardiovascular:  Negative for chest pain, palpitations, orthopnea, leg swelling and PND.  Gastrointestinal:  Negative for abdominal pain, constipation, diarrhea, heartburn, nausea and vomiting.  Genitourinary:  Negative for dysuria, hematuria and urgency.  Musculoskeletal:  Negative for joint pain and myalgias.  Skin:  Negative for itching and rash.  Neurological:  Negative for dizziness, tingling, weakness and headaches.  Endo/Heme/Allergies:  Negative for environmental allergies. Does not bruise/bleed easily.  Psychiatric/Behavioral:  Negative for depression. The patient is not nervous/anxious and does not have insomnia.   All other systems reviewed and are negative.   Objective:  Physical Exam Vitals reviewed.  Constitutional:      General: She is not in acute distress.    Appearance: She is well-developed. She is obese.  HENT:     Head: Normocephalic and atraumatic.  Eyes:     General: No scleral icterus.    Conjunctiva/sclera: Conjunctivae normal.     Pupils: Pupils are equal, round, and reactive to light.  Neck:     Vascular: No JVD.     Trachea: No tracheal deviation.  Cardiovascular:     Rate and Rhythm: Normal rate and regular rhythm.     Heart sounds: Normal heart sounds. No murmur heard. Pulmonary:     Effort: Pulmonary effort is normal. No tachypnea, accessory muscle usage or respiratory distress.     Breath sounds: No stridor. No wheezing, rhonchi or rales.     Comments: Diminished breath sounds in the bilateral bases. Abdominal:     General: Bowel sounds are normal. There is no distension.     Palpations: Abdomen  is soft.     Tenderness: There is no abdominal tenderness.  Musculoskeletal:        General: No tenderness.     Cervical back: Neck supple.  Lymphadenopathy:     Cervical: No cervical adenopathy.  Skin:    General: Skin is warm and dry.     Capillary Refill: Capillary refill takes less than 2 seconds.     Findings: No rash.  Neurological:     Mental Status: She is alert and oriented to person, place, and time.  Psychiatric:        Behavior: Behavior normal.     Vitals:   12/18/20 0959  BP: 122/70  Pulse: 90  Temp: 97.9 F (36.6 C)  TempSrc: Oral  SpO2: 99%  Weight: 260 lb (117.9 kg)  Height: 5\' 3"  (1.6 m)    99% on RA BMI Readings from Last 3 Encounters:  12/18/20 46.06 kg/m  12/13/20 45.06 kg/m  12/11/20 45.06 kg/m   Wt Readings from Last 3 Encounters:  12/18/20 260 lb (117.9 kg)  12/13/20 254 lb 6.4 oz (115.4 kg)  12/11/20 254 lb 6.4 oz (115.4 kg)     CBC    Component Value Date/Time   WBC 8.0 12/13/2020 0748   RBC 4.01 12/13/2020 0748   HGB 10.6 (L) 12/13/2020 0748   HCT 34.5 (L) 12/13/2020 0748   PLT 238 12/13/2020 0748   MCV 86.0 12/13/2020 0748   MCH 26.4 12/13/2020 0748   MCHC 30.7 12/13/2020 0748   RDW 16.8 (H) 12/13/2020 0748   LYMPHSABS 1.2 06/04/2020  1329   MONOABS 0.8 06/04/2020 1329   EOSABS 0.2 06/04/2020 1329   BASOSABS 0.1 06/04/2020 1329    Chest Imaging: April 2022 CT chest: Left lower lobe endobronchial lesion.  History of carcinoid. The patient's images have been independently reviewed by me.    Pulmonary Functions Testing Results: No flowsheet data found.  FeNO:   Pathology:   Echocardiogram:   Heart Catheterization:     Assessment & Plan:     ICD-10-CM   1. Carcinoid tumor determined by biopsy of lung  D3A.090     2. Endobronchial cancer, left ([place])  C34.92     3. Airway obstruction  J98.8       Discussion:  This is a 65 year old female, history of left lower lobe endobronchial carcinoid.  She was taken  for bronchoscopy this past week which showed extrinsic compression of the lower lobe airway.  We attempted balloon dilatation which was not very effective.  She did not have a significant burden of endobronchial disease that could be removed to help alleviate the obstruction.  Case has been discussed with cardiothoracic surgery she has a consult pending.  PFTs are also pending.  Plan: Referral to cardiothoracic surgery PFTs have been ordered and are pending. I am not sure if she is a candidate for lobectomy but I think this is the only option we have to offer for her persistent cough and symptoms. Unfortunately if she has a chronically occluded lower lobe we will predispose her to getting postobstructive pneumonia. Her baseline functional status is my only current concern.  She is able to ambulate but is slightly limited due to bilateral knee arthritis.  We appreciate cardiothoracic surgery's recommendations.    Current Outpatient Medications:    acetaminophen (TYLENOL) 325 MG tablet, Take 325 mg by mouth every 12 (twelve) hours as needed for moderate pain., Disp: , Rfl:    albuterol (PROVENTIL HFA;VENTOLIN HFA) 108 (90 Base) MCG/ACT inhaler, Inhale 2 puffs into the lungs every 6 (six) hours as needed for shortness of breath., Disp: , Rfl:    apixaban (ELIQUIS) 5 MG TABS tablet, Take 1 tablet (5 mg total) by mouth 2 (two) times daily., Disp: 60 tablet, Rfl: 0   betamethasone dipropionate (DIPROLENE) 0.05 % ointment, Apply 1 application topically daily as needed (for rash under breasts). , Disp: , Rfl:    calcitRIOL (ROCALTROL) 0.25 MCG capsule, Take 0.25 mcg by mouth every Monday, Wednesday, and Friday., Disp: , Rfl:    cetirizine (ZYRTEC) 10 MG tablet, Take 10 mg by mouth daily. (0800), Disp: , Rfl:    clotrimazole (LOTRIMIN) 1 % cream, Apply 1 application topically daily as needed (under breasts as needed for rash)., Disp: , Rfl:    DIABETIC TUSSIN EX 100 MG/5ML syrup, Take 100 mg by mouth  every 4 (four) hours as needed for cough or congestion. , Disp: , Rfl:    diltiazem (CARDIZEM CD) 240 MG 24 hr capsule, Take 1 capsule (240 mg total) by mouth daily. (Patient taking differently: Take 240 mg by mouth daily. (0800)), Disp: 30 capsule, Rfl: 0   DROPSAFE SAFETY PEN NEEDLES 31G X 6 MM MISC, , Disp: , Rfl:    fluticasone (FLONASE) 50 MCG/ACT nasal spray, Place 2 sprays into both nostrils daily as needed for allergies or rhinitis., Disp: , Rfl:    Fluticasone-Umeclidin-Vilant 100-62.5-25 MCG/INH AEPB, Inhale 1 puff into the lungs daily. (0800), Disp: , Rfl:    furosemide (LASIX) 40 MG tablet, Take 40 mg by mouth See admin  instructions. Take 40 mg twice daily, alternating with 40 mg daily every other day, Disp: , Rfl:    glucose blood (EASYMAX TEST) test strip, CHECK BLOOD SUGAR 4 TIMES A DAY. (BREAKFAST, LUNCH, SUPPER & BEDTIME), Disp: 100 strip, Rfl: 5   guaiFENesin (MUCINEX) 600 MG 12 hr tablet, Take 1 tablet (600 mg total) by mouth 2 (two) times daily as needed. (Patient taking differently: Take 600 mg by mouth 2 (two) times daily as needed (cough/congestion).), Disp: 60 tablet, Rfl: 11   HUMULIN R U-500 KWIKPEN 500 UNIT/ML KwikPen, INJECT 40 UNITS SUBCUTANEOUSLY AT BREAKFAST AND SUPPER: INJECT 30 UNITS AT LUNCH: ONLY WHEN GLUCOSE IS ABOVE 90 & EATING., Disp: 6 mL, Rfl: 0   ipratropium-albuterol (DUONEB) 0.5-2.5 (3) MG/3ML SOLN, Take 3 mLs by nebulization every 6 (six) hours as needed (for cough). , Disp: , Rfl:    isosorbide dinitrate (ISORDIL) 10 MG tablet, Take 1 tablet (10 mg total) by mouth 3 (three) times daily. (Patient taking differently: Take 10 mg by mouth 3 (three) times daily. (0800, 1400 & 2000)), Disp: 90 tablet, Rfl: 0   Lancets 28G MISC, CHECK BLOOD SUGAR 4 TIMES A DAY. (BREAKFAST, LUNCH, SUPPER, & BEDTIME), Disp: 100 each, Rfl: 5   losartan (COZAAR) 25 MG tablet, Take 25 mg by mouth daily., Disp: , Rfl:    metolazone (ZAROXOLYN) 5 MG tablet, Take 5 mg by mouth in the  morning. (0800), Disp: , Rfl:    metoprolol tartrate (LOPRESSOR) 50 MG tablet, Take 50 mg by mouth 2 (two) times daily. (0800 & 2000), Disp: , Rfl:    montelukast (SINGULAIR) 10 MG tablet, Take 10 mg by mouth at bedtime. (2000), Disp: , Rfl:    polyethylene glycol powder (GLYCOLAX/MIRALAX) 17 GM/SCOOP powder, Take 17 g by mouth daily as needed for mild constipation or moderate constipation., Disp: , Rfl:    potassium chloride SA (KLOR-CON) 20 MEQ tablet, Take 20 mEq by mouth 3 (three) times daily. (0800, 1400 & 2000), Disp: , Rfl:    rosuvastatin (CRESTOR) 20 MG tablet, TAKE (1) TABLET BY MOUTH ONCE DAILY. (Patient taking differently: Take 20 mg by mouth daily at 8 pm. (2000)), Disp: 30 tablet, Rfl: 3   triamcinolone cream (KENALOG) 0.5 %, Apply 1 application topically every 12 (twelve) hours as needed (applied to bilateral legs for allergic dermatitis)., Disp: , Rfl:    Vitamin D, Cholecalciferol, 25 MCG (1000 UT) TABS, Take 2,000 Units by mouth daily. (0800), Disp: 60 tablet, Rfl: 6    Garner Nash, DO [facility] 12/18/2020 10:17 AM

## 2020-12-19 DIAGNOSIS — N189 Chronic kidney disease, unspecified: Secondary | ICD-10-CM | POA: Diagnosis not present

## 2020-12-19 DIAGNOSIS — R809 Proteinuria, unspecified: Secondary | ICD-10-CM | POA: Diagnosis not present

## 2020-12-19 DIAGNOSIS — D381 Neoplasm of uncertain behavior of trachea, bronchus and lung: Secondary | ICD-10-CM | POA: Diagnosis not present

## 2020-12-19 DIAGNOSIS — D631 Anemia in chronic kidney disease: Secondary | ICD-10-CM | POA: Diagnosis not present

## 2020-12-19 DIAGNOSIS — E1129 Type 2 diabetes mellitus with other diabetic kidney complication: Secondary | ICD-10-CM | POA: Diagnosis not present

## 2020-12-19 DIAGNOSIS — I5032 Chronic diastolic (congestive) heart failure: Secondary | ICD-10-CM | POA: Diagnosis not present

## 2020-12-19 DIAGNOSIS — E1122 Type 2 diabetes mellitus with diabetic chronic kidney disease: Secondary | ICD-10-CM | POA: Diagnosis not present

## 2020-12-23 DIAGNOSIS — U071 COVID-19: Secondary | ICD-10-CM | POA: Diagnosis not present

## 2020-12-23 DIAGNOSIS — Z20828 Contact with and (suspected) exposure to other viral communicable diseases: Secondary | ICD-10-CM | POA: Diagnosis not present

## 2020-12-31 DIAGNOSIS — U071 COVID-19: Secondary | ICD-10-CM | POA: Diagnosis not present

## 2020-12-31 DIAGNOSIS — Z20828 Contact with and (suspected) exposure to other viral communicable diseases: Secondary | ICD-10-CM | POA: Diagnosis not present

## 2021-01-01 ENCOUNTER — Other Ambulatory Visit: Payer: Self-pay

## 2021-01-01 ENCOUNTER — Ambulatory Visit (INDEPENDENT_AMBULATORY_CARE_PROVIDER_SITE_OTHER): Payer: Medicare Other | Admitting: Pulmonary Disease

## 2021-01-01 DIAGNOSIS — Z23 Encounter for immunization: Secondary | ICD-10-CM | POA: Diagnosis not present

## 2021-01-01 DIAGNOSIS — C7A090 Malignant carcinoid tumor of the bronchus and lung: Secondary | ICD-10-CM | POA: Diagnosis not present

## 2021-01-01 LAB — PULMONARY FUNCTION TEST
DL/VA % pred: 146 %
DL/VA: 6.14 ml/min/mmHg/L
DLCO cor % pred: 98 %
DLCO cor: 18.87 ml/min/mmHg
DLCO unc % pred: 88 %
DLCO unc: 17.02 ml/min/mmHg
FEF 25-75 Pre: 1.61 L/sec
FEF2575-%Pred-Pre: 77 %
FEV1-%Change-Post: -22 %
FEV1-%Pred-Post: 53 %
FEV1-%Pred-Pre: 69 %
FEV1-Post: 1.25 L
FEV1-Pre: 1.61 L
FEV1FVC-%Change-Post: 10 %
FEV1FVC-%Pred-Pre: 107 %
FEV6-%Change-Post: -29 %
FEV6-%Pred-Post: 46 %
FEV6-%Pred-Pre: 66 %
FEV6-Post: 1.36 L
FEV6-Pre: 1.94 L
FEV6FVC-%Pred-Post: 104 %
FEV6FVC-%Pred-Pre: 104 %
FVC-%Change-Post: -29 %
FVC-%Pred-Post: 44 %
FVC-%Pred-Pre: 63 %
FVC-Post: 1.36 L
FVC-Pre: 1.94 L
Post FEV1/FVC ratio: 92 %
Post FEV6/FVC ratio: 100 %
Pre FEV1/FVC ratio: 83 %
Pre FEV6/FVC Ratio: 100 %
RV % pred: 69 %
RV: 1.41 L
TLC % pred: 71 %
TLC: 3.49 L

## 2021-01-01 NOTE — Patient Instructions (Signed)
Full PFT performed today. °

## 2021-01-01 NOTE — Progress Notes (Signed)
Full PFT performed today. °

## 2021-01-02 ENCOUNTER — Other Ambulatory Visit: Payer: Self-pay | Admitting: "Endocrinology

## 2021-01-02 NOTE — Telephone Encounter (Signed)
Last OV 12/11/2020 Next OV 04/14/2021  Do you want to continue this 2000 IU of Vitamin D for the patient?

## 2021-01-03 DIAGNOSIS — S80811A Abrasion, right lower leg, initial encounter: Secondary | ICD-10-CM | POA: Diagnosis not present

## 2021-01-06 DIAGNOSIS — N189 Chronic kidney disease, unspecified: Secondary | ICD-10-CM | POA: Diagnosis not present

## 2021-01-06 DIAGNOSIS — E1122 Type 2 diabetes mellitus with diabetic chronic kidney disease: Secondary | ICD-10-CM | POA: Diagnosis not present

## 2021-01-06 DIAGNOSIS — R809 Proteinuria, unspecified: Secondary | ICD-10-CM | POA: Diagnosis not present

## 2021-01-06 DIAGNOSIS — E211 Secondary hyperparathyroidism, not elsewhere classified: Secondary | ICD-10-CM | POA: Diagnosis not present

## 2021-01-06 DIAGNOSIS — E1129 Type 2 diabetes mellitus with other diabetic kidney complication: Secondary | ICD-10-CM | POA: Diagnosis not present

## 2021-01-06 DIAGNOSIS — D381 Neoplasm of uncertain behavior of trachea, bronchus and lung: Secondary | ICD-10-CM | POA: Diagnosis not present

## 2021-01-06 DIAGNOSIS — D631 Anemia in chronic kidney disease: Secondary | ICD-10-CM | POA: Diagnosis not present

## 2021-01-06 DIAGNOSIS — I5032 Chronic diastolic (congestive) heart failure: Secondary | ICD-10-CM | POA: Diagnosis not present

## 2021-01-10 ENCOUNTER — Institutional Professional Consult (permissible substitution) (INDEPENDENT_AMBULATORY_CARE_PROVIDER_SITE_OTHER): Payer: Medicare Other | Admitting: Thoracic Surgery (Cardiothoracic Vascular Surgery)

## 2021-01-10 ENCOUNTER — Other Ambulatory Visit: Payer: Self-pay | Admitting: Thoracic Surgery (Cardiothoracic Vascular Surgery)

## 2021-01-10 ENCOUNTER — Other Ambulatory Visit: Payer: Self-pay

## 2021-01-10 VITALS — BP 132/72 | HR 53 | Resp 20 | Ht 63.0 in | Wt 260.0 lb

## 2021-01-10 DIAGNOSIS — C7A090 Malignant carcinoid tumor of the bronchus and lung: Secondary | ICD-10-CM

## 2021-01-10 NOTE — Progress Notes (Signed)
[address]       [place] [address]             [phone]                    [name] [place] Medical Record #[id number] Date of Birth: 09/22/1955  Referring: Garner Nash, DO Primary Care: Rosita Fire, MD Primary Cardiologist: Rozann Lesches, MD  Chief Complaint:    Chief Complaint  Patient presents with   Lung Lesion    Initial surgical consult, PFT 10/5, CT chest 4/26, bronch 9/16    History of Present Illness:    [name] 65 y.o. female referred by Dr. Valeta Harms for surgical evaluation of a left lower lobe carcinoid tumor.  She originally presented in February 2020 with respiratory symptoms.  She underwent a bronchoscopy which identified an endobronchial lesion which was biopsied and consistent with carcinoid tumor.  Since that point she has undergone follow-up bronchoscopies with endobronchial resection of the lesion to help improve her pulmonary function.  Currently her biggest complaint is chronic cough that occurs sporadically.  She currently lives in assisted living she presents today in a wheelchair and does not appear very functional.  She states that her breathing has improved markedly, but is mostly concerned about her cough.      Zubrod Score: At the time of surgery this patient's most appropriate activity status/level should be described as: []     0    Normal activity, no symptoms []     1    Restricted in physical strenuous activity but ambulatory, able to do out light work [x]     2    Ambulatory and capable of self care, unable to do work activities, up and about               >50 % of waking hours                              []     3    Only limited self care, in bed greater than 50% of waking hours []     4    Completely disabled, no self care, confined to bed or chair []     5    Moribund   Past Medical History:  Diagnosis Date   Arthritis    Asthma    Atrial fibrillation ([place])    Carcinoid tumor determined  by biopsy of lung 05/2018   Left lung   Chronic diastolic CHF (congestive heart failure) ([place])    CKD (chronic kidney disease), stage III ([place])    Diabetes mellitus type 2 in obese [facility])    Essential hypertension    Iron deficiency anemia 10/16/2010   Mental handicap 10/16/2010   Mild CAD 2013   Morbid obesity ([place])    OSA (obstructive sleep apnea)     Past Surgical History:  Procedure Laterality Date   ABDOMINAL HYSTERECTOMY     AV FISTULA PLACEMENT Right 06/06/2018   Procedure: ARTERIOVENOUS (AV) FISTULA CREATION;  Surgeon: Marty Heck, MD;  Location: [facility];  Service: Vascular;  Laterality: Right;   BRONCHIAL DILITATION  12/13/2020   Procedure: BRONCHIAL DILITATION;  Surgeon: Garner Nash, DO;  Location: [facility];  Service: Pulmonary;;   CESAREAN SECTION     CHOLECYSTECTOMY     COLONOSCOPY  08/2010   normal TI, sigmoid polyp (adenoma ). Next TCS  due  08/2015,   COLONOSCOPY WITH PROPOFOL N/A 07/08/2020   Procedure: COLONOSCOPY WITH PROPOFOL;  Surgeon: Daneil Dolin, MD;  [facility];  Service: Endoscopy;  Laterality: N/A;  AM (diabetic and facility patient)   ESOPHAGOGASTRODUODENOSCOPY  08/2010   antral and duodenal erosions s/p bx (chronic gastritis, no h.pylori, no celiac dz ), hiatal hernia   FISTULA SUPERFICIALIZATION Right 08/25/2018   Procedure: FISTULA SUPERFICIALIZATION RIGHT ARM;  Surgeon: Waynetta Sandy, MD;  Location: [facility];  Service: Cardiovascular;  Laterality: Right;   IR FLUORO GUIDE CV LINE RIGHT  05/31/2018   IR US GUIDE VASC ACCESS RIGHT  05/31/2018   KNEE SURGERY     right knee @ 65 years of age   LEFT AND RIGHT HEART CATHETERIZATION WITH CORONARY ANGIOGRAM N/A 09/21/2011   Procedure: LEFT AND RIGHT HEART CATHETERIZATION WITH CORONARY ANGIOGRAM;  Surgeon: Birdie Riddle, MD;  Location: [facility];  Service: Cardiovascular;  Laterality: N/A;   POLYPECTOMY  07/08/2020   Procedure: POLYPECTOMY INTESTINAL;  Surgeon: Daneil Dolin, MD;   [facility];  Service: Endoscopy;;   VIDEO BRONCHOSCOPY Left 12/13/2020   Procedure: VIDEO BRONCHOSCOPY WITHOUT FLUORO;  Surgeon: Garner Nash, DO;  Location: [facility];  Service: Pulmonary;  Laterality: Left;  Cryotherapy    Family History  Problem Relation Age of Onset   Diabetes Mother    Hypertension Mother    Heart failure Mother    Hypertension Father    Atrial fibrillation Father    Kidney disease Other        son reportedly has had cyst on kidney and lung s/p surgery at [facility]   Hypertension Son    Colon cancer Neg Hx      Social History   Tobacco Use  Smoking Status Former   Packs/day: 1.00   Years: 0.00   Pack years: 0.00   Types: Cigarettes   Quit date: 04/11/1996   Years since quitting: 24.7  Smokeless Tobacco Never  Tobacco Comments   quit a couple year ago    Social History   Substance and Sexual Activity  Alcohol Use No     No Known Allergies  Current Outpatient Medications  Medication Sig Dispense Refill   acetaminophen (TYLENOL) 325 MG tablet Take 325 mg by mouth every 12 (twelve) hours as needed for moderate pain.     albuterol (PROVENTIL HFA;VENTOLIN HFA) 108 (90 Base) MCG/ACT inhaler Inhale 2 puffs into the lungs every 6 (six) hours as needed for shortness of breath.     apixaban (ELIQUIS) 5 MG TABS tablet Take 1 tablet (5 mg total) by mouth 2 (two) times daily. 60 tablet 0   betamethasone dipropionate (DIPROLENE) 0.05 % ointment Apply 1 application topically daily as needed (for rash under breasts).      calcitRIOL (ROCALTROL) 0.25 MCG capsule Take 0.25 mcg by mouth every Monday, Wednesday, and Friday.     cetirizine (ZYRTEC) 10 MG tablet Take 10 mg by mouth daily. (0800)     Cholecalciferol (VITAMIN D3) 50 MCG (2000 UT) TABS TAKE 1 TABLET BY MOUTH ONCE DAILY. 30 tablet 2   clotrimazole (LOTRIMIN) 1 % cream Apply 1 application topically daily as needed (under breasts as needed for rash).     DIABETIC TUSSIN EX 100 MG/5ML syrup  Take 100 mg by mouth every 4 (four) hours as needed for cough or congestion.      diltiazem (CARDIZEM CD) 240 MG 24 hr capsule Take 1 capsule (240 mg  total) by mouth daily. (Patient taking differently: Take 240 mg by mouth daily. (0800)) 30 capsule 0   DROPSAFE SAFETY PEN NEEDLES 31G X 6 MM MISC      fluticasone (FLONASE) 50 MCG/ACT nasal spray Place 2 sprays into both nostrils daily as needed for allergies or rhinitis.     Fluticasone-Umeclidin-Vilant 100-62.5-25 MCG/INH AEPB Inhale 1 puff into the lungs daily. (0800)     furosemide (LASIX) 40 MG tablet Take 40 mg by mouth See admin instructions. Take 40 mg twice daily, alternating with 40 mg daily every other day     glucose blood (EASYMAX TEST) test strip CHECK BLOOD SUGAR 4 TIMES A DAY. (BREAKFAST, LUNCH, SUPPER & BEDTIME) 100 strip 5   guaiFENesin (MUCINEX) 600 MG 12 hr tablet Take 1 tablet (600 mg total) by mouth 2 (two) times daily as needed. (Patient taking differently: Take 600 mg by mouth 2 (two) times daily as needed (cough/congestion).) 60 tablet 11   HUMULIN R U-500 KWIKPEN 500 UNIT/ML KwikPen INJECT 40 UNITS SUBCUTANEOUSLY AT BREAKFAST AND SUPPER: INJECT 30 UNITS AT LUNCH: ONLY WHEN GLUCOSE IS ABOVE 90 & EATING. 6 mL 0   ipratropium-albuterol (DUONEB) 0.5-2.5 (3) MG/3ML SOLN Take 3 mLs by nebulization every 6 (six) hours as needed (for cough).      isosorbide dinitrate (ISORDIL) 10 MG tablet Take 1 tablet (10 mg total) by mouth 3 (three) times daily. (Patient taking differently: Take 10 mg by mouth 3 (three) times daily. (0800, 1400 & 2000)) 90 tablet 0   Lancets 28G MISC CHECK BLOOD SUGAR 4 TIMES A DAY. (BREAKFAST, LUNCH, SUPPER, & BEDTIME) 100 each 5   losartan (COZAAR) 25 MG tablet Take 25 mg by mouth daily.     metolazone (ZAROXOLYN) 5 MG tablet Take 5 mg by mouth in the morning. (0800)     metoprolol tartrate (LOPRESSOR) 50 MG tablet Take 50 mg by mouth 2 (two) times daily. (0800 & 2000)     montelukast (SINGULAIR) 10 MG tablet Take  10 mg by mouth at bedtime. (2000)     polyethylene glycol powder (GLYCOLAX/MIRALAX) 17 GM/SCOOP powder Take 17 g by mouth daily as needed for mild constipation or moderate constipation.     potassium chloride SA (KLOR-CON) 20 MEQ tablet Take 20 mEq by mouth 3 (three) times daily. (0800, 1400 & 2000)     rosuvastatin (CRESTOR) 20 MG tablet TAKE (1) TABLET BY MOUTH ONCE DAILY. (Patient taking differently: Take 20 mg by mouth daily at 8 pm. (2000)) 30 tablet 3   triamcinolone cream (KENALOG) 0.5 % Apply 1 application topically every 12 (twelve) hours as needed (applied to bilateral legs for allergic dermatitis).     Vitamin D, Cholecalciferol, 25 MCG (1000 UT) TABS Take 2,000 Units by mouth daily. (0800) 60 tablet 6   No current facility-administered medications for this visit.    Review of Systems  Constitutional:  Positive for malaise/fatigue.  Respiratory:  Positive for cough. Negative for hemoptysis, sputum production and shortness of breath.   Cardiovascular: Negative.     PHYSICAL EXAMINATION: BP 132/72 (BP Location: Left Arm, Patient Position: Sitting)   Pulse (!) 53   Resp 20   Ht 5\' 3"  (1.6 m)   Wt 260 lb (117.9 kg)   SpO2 96% Comment: RA  BMI 46.06 kg/m  Physical Exam Constitutional:      General: She is not in acute distress.    Appearance: She is obese. She is ill-appearing.  HENT:     Head: Normocephalic  and atraumatic.  Eyes:     Extraocular Movements: Extraocular movements intact.  Cardiovascular:     Rate and Rhythm: Normal rate.     Heart sounds: Murmur heard.  Pulmonary:     Effort: Pulmonary effort is normal. No respiratory distress.     Breath sounds: Normal breath sounds.  Musculoskeletal:     Comments: Presents today in a wheelchair.  Skin:    General: Skin is warm and dry.  Neurological:     General: No focal deficit present.     Mental Status: She is alert and oriented to person, place, and time.    Diagnostic Studies & Laboratory data:      Recent Radiology Findings:   No results found.     I have independently reviewed the above radiology studies  and reviewed the findings with the patient.   Recent Lab Findings: Lab Results  Component Value Date   WBC 8.0 12/13/2020   HGB 10.6 (L) 12/13/2020   HCT 34.5 (L) 12/13/2020   PLT 238 12/13/2020   GLUCOSE 176 (H) 11/19/2020   CHOL 127 05/07/2020   TRIG 150 05/07/2020   HDL 42 05/07/2020   LDLCALC 62 05/07/2020   ALT 10 11/19/2020   AST 13 11/19/2020   NA 142 11/19/2020   K 3.5 11/19/2020   CL 97 11/19/2020   CREATININE 1.60 (H) 11/19/2020   BUN 46 (H) 11/19/2020   CO2 25 11/19/2020   TSH 2.300 04/23/2020   INR 1.1 06/06/2018   HGBA1C 6.5 12/11/2020     PFTs: - FVC: 63% - FEV1: 69% -DLCO: 98%    Assessment / Plan:   65 year old female with a central left lower lobe carcinoid tumor.  I personally reviewed her CT scan from April of this year along with her PET/CT.  We will need to obtain updated images.  It is unclear on the scan from April as to whether or not there is mediastinal involvement of the carcinoid tumor.  It is quite central and her does appear to be in some lymphadenopathy and the bifurcation bronchi.  Based on the appearance of the nodule and its location, I am concerned that I may not be able to achieve a complete resection.  She is quite debilitated and on blood thinners this question procedure and inpatient complaints from a respiratory standpoint.  I understand that radiation would not be a good option, but surgery will be very high risk in this patient.  I will see her back following her repeat CT chest.  I  spent 40 minutes with  the patient face to face in counseling and coordination of care.    Lajuana Matte 01/10/2021 12:05 PM

## 2021-01-13 DIAGNOSIS — Z20828 Contact with and (suspected) exposure to other viral communicable diseases: Secondary | ICD-10-CM | POA: Diagnosis not present

## 2021-01-13 DIAGNOSIS — U071 COVID-19: Secondary | ICD-10-CM | POA: Diagnosis not present

## 2021-01-21 DIAGNOSIS — B351 Tinea unguium: Secondary | ICD-10-CM | POA: Diagnosis not present

## 2021-01-21 DIAGNOSIS — M79674 Pain in right toe(s): Secondary | ICD-10-CM | POA: Diagnosis not present

## 2021-01-21 DIAGNOSIS — M79675 Pain in left toe(s): Secondary | ICD-10-CM | POA: Diagnosis not present

## 2021-01-22 DIAGNOSIS — Z20828 Contact with and (suspected) exposure to other viral communicable diseases: Secondary | ICD-10-CM | POA: Diagnosis not present

## 2021-01-22 DIAGNOSIS — U071 COVID-19: Secondary | ICD-10-CM | POA: Diagnosis not present

## 2021-01-27 ENCOUNTER — Inpatient Hospital Stay: Admission: RE | Admit: 2021-01-27 | Payer: Medicare Other | Source: Ambulatory Visit

## 2021-01-28 ENCOUNTER — Inpatient Hospital Stay: Payer: Medicare Other | Attending: Hematology | Admitting: Hematology

## 2021-01-28 ENCOUNTER — Inpatient Hospital Stay: Payer: Medicare Other

## 2021-01-28 ENCOUNTER — Other Ambulatory Visit: Payer: Self-pay

## 2021-01-28 VITALS — BP 119/65 | HR 66 | Temp 98.1°F | Resp 17 | Ht 63.0 in | Wt 259.9 lb

## 2021-01-28 DIAGNOSIS — D649 Anemia, unspecified: Secondary | ICD-10-CM | POA: Insufficient documentation

## 2021-01-28 DIAGNOSIS — D3A090 Benign carcinoid tumor of the bronchus and lung: Secondary | ICD-10-CM

## 2021-01-28 DIAGNOSIS — N189 Chronic kidney disease, unspecified: Secondary | ICD-10-CM | POA: Diagnosis not present

## 2021-01-28 DIAGNOSIS — C7A090 Malignant carcinoid tumor of the bronchus and lung: Secondary | ICD-10-CM | POA: Insufficient documentation

## 2021-01-28 LAB — CMP (CANCER CENTER ONLY)
ALT: 9 U/L (ref 0–44)
AST: 11 U/L — ABNORMAL LOW (ref 15–41)
Albumin: 3.4 g/dL — ABNORMAL LOW (ref 3.5–5.0)
Alkaline Phosphatase: 81 U/L (ref 38–126)
Anion gap: 10 (ref 5–15)
BUN: 45 mg/dL — ABNORMAL HIGH (ref 8–23)
CO2: 31 mmol/L (ref 22–32)
Calcium: 9.4 mg/dL (ref 8.9–10.3)
Chloride: 96 mmol/L — ABNORMAL LOW (ref 98–111)
Creatinine: 1.92 mg/dL — ABNORMAL HIGH (ref 0.44–1.00)
GFR, Estimated: 29 mL/min — ABNORMAL LOW (ref >60)
Glucose, Bld: 204 mg/dL — ABNORMAL HIGH (ref 70–99)
Potassium: 3.2 mmol/L — ABNORMAL LOW (ref 3.5–5.1)
Sodium: 137 mmol/L (ref 135–145)
Total Bilirubin: 0.5 mg/dL (ref 0.3–1.2)
Total Protein: 7.5 g/dL (ref 6.5–8.1)

## 2021-01-28 LAB — CBC WITH DIFFERENTIAL (CANCER CENTER ONLY)
Abs Immature Granulocytes: 0.05 10*3/uL (ref 0.00–0.07)
Basophils Absolute: 0.1 10*3/uL (ref 0.0–0.1)
Basophils Relative: 1 %
Eosinophils Absolute: 0.3 10*3/uL (ref 0.0–0.5)
Eosinophils Relative: 3 %
HCT: 31.9 % — ABNORMAL LOW (ref 36.0–46.0)
Hemoglobin: 10.1 g/dL — ABNORMAL LOW (ref 12.0–15.0)
Immature Granulocytes: 1 %
Lymphocytes Relative: 16 %
Lymphs Abs: 1.7 10*3/uL (ref 0.7–4.0)
MCH: 26.4 pg (ref 26.0–34.0)
MCHC: 31.7 g/dL (ref 30.0–36.0)
MCV: 83.5 fL (ref 80.0–100.0)
Monocytes Absolute: 1 10*3/uL (ref 0.1–1.0)
Monocytes Relative: 9 %
Neutro Abs: 7.4 10*3/uL (ref 1.7–7.7)
Neutrophils Relative %: 70 %
Platelet Count: 282 10*3/uL (ref 150–400)
RBC: 3.82 MIL/uL — ABNORMAL LOW (ref 3.87–5.11)
RDW: 16.2 % — ABNORMAL HIGH (ref 11.5–15.5)
WBC Count: 10.5 10*3/uL (ref 4.0–10.5)
nRBC: 0 % (ref 0.0–0.2)

## 2021-01-28 MED ORDER — BENZONATATE 100 MG PO CAPS: 100.0000 mg | ORAL_CAPSULE | Freq: Two times a day (BID) | ORAL | 0 refills | Status: DC | PRN

## 2021-01-29 DIAGNOSIS — Z20828 Contact with and (suspected) exposure to other viral communicable diseases: Secondary | ICD-10-CM | POA: Diagnosis not present

## 2021-01-29 DIAGNOSIS — U071 COVID-19: Secondary | ICD-10-CM | POA: Diagnosis not present

## 2021-01-29 LAB — CHROMOGRANIN A: Chromogranin A (ng/mL): 199.5 ng/mL — ABNORMAL HIGH (ref 0.0–101.8)

## 2021-01-30 ENCOUNTER — Telehealth: Payer: Self-pay | Admitting: Hematology

## 2021-01-30 NOTE — Telephone Encounter (Signed)
Scheduled follow-up appointment per 11/1 los. Patient's nurse is aware.

## 2021-01-31 ENCOUNTER — Telehealth: Payer: Medicare Other | Admitting: Thoracic Surgery (Cardiothoracic Vascular Surgery)

## 2021-02-05 DIAGNOSIS — Z20828 Contact with and (suspected) exposure to other viral communicable diseases: Secondary | ICD-10-CM | POA: Diagnosis not present

## 2021-02-05 DIAGNOSIS — U071 COVID-19: Secondary | ICD-10-CM | POA: Diagnosis not present

## 2021-02-07 ENCOUNTER — Other Ambulatory Visit: Payer: Self-pay | Admitting: "Endocrinology

## 2021-02-10 DIAGNOSIS — I10 Essential (primary) hypertension: Secondary | ICD-10-CM | POA: Diagnosis not present

## 2021-02-10 DIAGNOSIS — I4821 Permanent atrial fibrillation: Secondary | ICD-10-CM | POA: Diagnosis not present

## 2021-02-10 DIAGNOSIS — I5032 Chronic diastolic (congestive) heart failure: Secondary | ICD-10-CM | POA: Diagnosis not present

## 2021-02-10 DIAGNOSIS — Z1331 Encounter for screening for depression: Secondary | ICD-10-CM | POA: Diagnosis not present

## 2021-02-10 DIAGNOSIS — E1122 Type 2 diabetes mellitus with diabetic chronic kidney disease: Secondary | ICD-10-CM | POA: Diagnosis not present

## 2021-02-10 DIAGNOSIS — N1831 Chronic kidney disease, stage 3a: Secondary | ICD-10-CM | POA: Diagnosis not present

## 2021-02-10 DIAGNOSIS — Z23 Encounter for immunization: Secondary | ICD-10-CM | POA: Diagnosis not present

## 2021-02-10 DIAGNOSIS — J449 Chronic obstructive pulmonary disease, unspecified: Secondary | ICD-10-CM | POA: Diagnosis not present

## 2021-02-10 DIAGNOSIS — Z1389 Encounter for screening for other disorder: Secondary | ICD-10-CM | POA: Diagnosis not present

## 2021-02-10 NOTE — Progress Notes (Signed)
HEMATOLOGY/ONCOLOGY CLINIC NOTE  Date of Service: .01/28/2021 \  Patient Care Team: Rosita Fire, MD as PCP - General (Internal Medicine) Satira Sark, MD as PCP - Cardiology (Cardiology) Danie Binder, MD (Inactive) (Gastroenterology) [name] Cristopher Estimable, MD as Consulting Physician (Gastroenterology)  [place] [phone] Legal Guardian [employer]  CHIEF COMPLAINTS/PURPOSE OF CONSULTATION:  Bronchopulmonary Carcinoid  HISTORY OF PRESENTING ILLNESS:  [name] is a 65 y.o. F with mental disability in LTC, asthma well-controlled, CKD III Baseline Cr 1.3-1.6, HTN, and DM who presented with severe dyspnea requiring BiPAP by EMS.  On arrival in the ER was in V. tach, cardiology consulted and given amiodarone bolus, then developed unstable wide-complex tachycardia and acute respiratory failure.  Creatinine 4.3, potassium 7.5 on admission, pH 7.15, PCO2 45, lactic acid 3.2, chest x-ray pneumonia.  Temporizing potassium measures given, broad-spectrum antibiotics started, intubated and transferred to ICU at [facility] [place]. Extubated 2/28 after 6 days.   The patient was recently admitted to the hospital earlier in February 2020 for community-acquired pneumonia.  During her hospitalization, the patient had a CT of the chest performed on 05/13/2018 which showed a masslike consolidation measuring 5 x 8.2 cm over the left lower lobe.  It was recommended that she have a follow-up CT scan performed within the next month as an outpatient.  During this current hospitalization, it was recommended that she undergo TEE and bronchoscopy.  This was performed on 05/26/2018.  Biopsy results showed typical carcinoid tumor and atypical squamous metaplasia, favor reactive.     When seen today, the patient had just finished dialysis.  She reports having fatigue and a cough.  She states that she has occasional blood-tinged sputum when she coughs.  She denies fevers and chills.  Denies chest pain and shortness  of breath.  Denies abdominal discomfort, nausea, vomiting, constipation, diarrhea.  She reports intermittent headaches. Oncology was consulted for recommendations regarding her carcinoid tumor.   Of note, the patient reside in an assisted living facility in [place], [place].  She tells me that she has a son who visits her regularly but he is not her power of attorney.  She has a guardian [name]) who oversees her care.  Telephone numbers are on the chart.  INTERVAL HISTORY:   [name] returns today for management and evaluation of her Bronchopulmonary carcinoid. The patient's last visit with [place] was on 08/01/2020. The pt reports that she is doing well overall. We are joined today by a helper from her facility.  The pt reports no new symptoms or concerns. Notes some intermittent cough but no chest pain or shortness of breath.  Patient was not deemed to be a good candidate for surgery and therefore was referred to radiation oncology for consideration of palliative SRS.this is a slow-growing tumor. Radiation oncology initiated referral to pulmonary to consider endobronchial intervention which was not successful. Patient has since been referred to Dr. Kipp Brood for surgical consideration.  No weight loss no fevers no chills no night sweats. Labs today CBC stable with hemoglobin of 10.1 WBC count of 10.5k and platelets of 282k CMP stable with a creatinine of 1.92 potassium was 3.2 Chromogranin level today is at 199.5 which is stable from 8 months ago when it was 202.5  MEDICAL HISTORY:  Past Medical History:  Diagnosis Date   Arthritis    Asthma    Atrial fibrillation ([place])    Carcinoid tumor determined by biopsy of lung 05/2018   Left lung  Chronic diastolic CHF (congestive heart failure) (HCC)    CKD (chronic kidney disease), stage III (HCC)    Diabetes mellitus type 2 in obese [facility])    Essential hypertension    Iron deficiency anemia 10/16/2010   Mental handicap  10/16/2010   Mild CAD 2013   Morbid obesity (HCC)    OSA (obstructive sleep apnea)     SURGICAL HISTORY: Past Surgical History:  Procedure Laterality Date   ABDOMINAL HYSTERECTOMY     AV FISTULA PLACEMENT Right 06/06/2018   Procedure: ARTERIOVENOUS (AV) FISTULA CREATION;  Surgeon: Marty Heck, MD;  Location: [facility];  Service: Vascular;  Laterality: Right;   BRONCHIAL DILITATION  12/13/2020   Procedure: BRONCHIAL DILITATION;  Surgeon: Garner Nash, DO;  Location: [facility];  Service: Pulmonary;;   CESAREAN SECTION     CHOLECYSTECTOMY     COLONOSCOPY  08/2010   normal TI, sigmoid polyp (adenoma ). Next TCS due  08/2015,   COLONOSCOPY WITH PROPOFOL N/A 07/08/2020   Procedure: COLONOSCOPY WITH PROPOFOL;  Surgeon: Daneil Dolin, MD;  [facility];  Service: Endoscopy;  Laterality: N/A;  AM (diabetic and facility patient)   ESOPHAGOGASTRODUODENOSCOPY  08/2010   antral and duodenal erosions s/p bx (chronic gastritis, no h.pylori, no celiac dz ), hiatal hernia   FISTULA SUPERFICIALIZATION Right 08/25/2018   Procedure: FISTULA SUPERFICIALIZATION RIGHT ARM;  Surgeon: Waynetta Sandy, MD;  Location: [facility];  Service: Cardiovascular;  Laterality: Right;   IR FLUORO GUIDE CV LINE RIGHT  05/31/2018   IR US GUIDE VASC ACCESS RIGHT  05/31/2018   KNEE SURGERY     right knee @ 65 years of age   LEFT AND RIGHT HEART CATHETERIZATION WITH CORONARY ANGIOGRAM N/A 09/21/2011   Procedure: LEFT AND RIGHT HEART CATHETERIZATION WITH CORONARY ANGIOGRAM;  Surgeon: Birdie Riddle, MD;  Location: [facility];  Service: Cardiovascular;  Laterality: N/A;   POLYPECTOMY  07/08/2020   Procedure: POLYPECTOMY INTESTINAL;  Surgeon: Daneil Dolin, MD;  [facility];  Service: Endoscopy;;   VIDEO BRONCHOSCOPY Left 12/13/2020   Procedure: VIDEO BRONCHOSCOPY WITHOUT FLUORO;  Surgeon: Garner Nash, DO;  Location: [facility];  Service: Pulmonary;  Laterality: Left;  Cryotherapy    SOCIAL  HISTORY: Social History   Socioeconomic History   Marital status: Divorced    Spouse name: Not on file   Number of children: 1   Years of education: Not on file   Highest education level: Not on file  Occupational History    Employer: UNEMPLOYED  Tobacco Use   Smoking status: Former    Packs/day: 1.00    Years: 0.00    Pack years: 0.00    Types: Cigarettes    Quit date: 04/11/1996    Years since quitting: 24.8   Smokeless tobacco: Never   Tobacco comments:    quit a couple year ago  Vaping Use   Vaping Use: Never used  Substance and Sexual Activity   Alcohol use: No   Drug use: No   Sexual activity: Never  Other Topics Concern   Not on file  Social History Narrative   Lives with parents.    Social Determinants of Health   Financial Resource Strain: Not on file  Food Insecurity: Not on file  Transportation Needs: Not on file  Physical Activity: Not on file  Stress: Not on file  Social Connections: Not on file  Intimate Partner Violence: Not on file    FAMILY HISTORY: Family  History  Problem Relation Age of Onset   Diabetes Mother    Hypertension Mother    Heart failure Mother    Hypertension Father    Atrial fibrillation Father    Kidney disease Other        son reportedly has had cyst on kidney and lung s/p surgery [facility]   Hypertension Son    Colon cancer Neg Hx     ALLERGIES:  has No Known Allergies.  MEDICATIONS:  Current Outpatient Medications  Medication Sig Dispense Refill   benzonatate (TESSALON) 100 MG capsule Take 1 capsule (100 mg total) by mouth 2 (two) times daily as needed for cough. 60 capsule 0   acetaminophen (TYLENOL) 325 MG tablet Take 325 mg by mouth every 12 (twelve) hours as needed for moderate pain.     albuterol (PROVENTIL HFA;VENTOLIN HFA) 108 (90 Base) MCG/ACT inhaler Inhale 2 puffs into the lungs every 6 (six) hours as needed for shortness of breath.     apixaban (ELIQUIS) 5 MG TABS tablet Take 1 tablet (5 mg total) by  mouth 2 (two) times daily. 60 tablet 0   betamethasone dipropionate (DIPROLENE) 0.05 % ointment Apply 1 application topically daily as needed (for rash under breasts).      calcitRIOL (ROCALTROL) 0.25 MCG capsule Take 0.25 mcg by mouth every Monday, Wednesday, and Friday.     cetirizine (ZYRTEC) 10 MG tablet Take 10 mg by mouth daily. (0800)     Cholecalciferol (VITAMIN D3) 50 MCG (2000 UT) TABS TAKE 1 TABLET BY MOUTH ONCE DAILY. 30 tablet 2   clotrimazole (LOTRIMIN) 1 % cream Apply 1 application topically daily as needed (under breasts as needed for rash).     DIABETIC TUSSIN EX 100 MG/5ML syrup Take 100 mg by mouth every 4 (four) hours as needed for cough or congestion.      diltiazem (CARDIZEM CD) 240 MG 24 hr capsule Take 1 capsule (240 mg total) by mouth daily. (Patient taking differently: Take 240 mg by mouth daily. (0800)) 30 capsule 0   DROPSAFE SAFETY PEN NEEDLES 31G X 6 MM MISC      fluticasone (FLONASE) 50 MCG/ACT nasal spray Place 2 sprays into both nostrils daily as needed for allergies or rhinitis.     Fluticasone-Umeclidin-Vilant 100-62.5-25 MCG/INH AEPB Inhale 1 puff into the lungs daily. (0800)     furosemide (LASIX) 40 MG tablet Take 40 mg by mouth See admin instructions. Take 40 mg twice daily, alternating with 40 mg daily every other day     glucose blood (EASYMAX TEST) test strip CHECK BLOOD SUGAR 4 TIMES A DAY. (BREAKFAST, LUNCH, SUPPER & BEDTIME) 100 strip 5   guaiFENesin (MUCINEX) 600 MG 12 hr tablet Take 1 tablet (600 mg total) by mouth 2 (two) times daily as needed. (Patient taking differently: Take 600 mg by mouth 2 (two) times daily as needed (cough/congestion).) 60 tablet 11   HUMULIN R U-500 KWIKPEN 500 UNIT/ML KwikPen INJECT 40 UNITS SUBCUTANEOUSLY AT BREAKFAST AND SUPPER: INJECT 30 UNITS AT LUNCH: ONLY WHEN GLUCOSE IS ABOVE 90 & EATING. 6 mL 0   ipratropium-albuterol (DUONEB) 0.5-2.5 (3) MG/3ML SOLN Take 3 mLs by nebulization every 6 (six) hours as needed (for cough).       isosorbide dinitrate (ISORDIL) 10 MG tablet Take 1 tablet (10 mg total) by mouth 3 (three) times daily. (Patient taking differently: Take 10 mg by mouth 3 (three) times daily. (0800, 1400 & 2000)) 90 tablet 0   Lancets 28G MISC CHECK  BLOOD SUGAR 4 TIMES A DAY. (BREAKFAST, LUNCH, SUPPER, & BEDTIME) 100 each 5   losartan (COZAAR) 25 MG tablet Take 25 mg by mouth daily.     metolazone (ZAROXOLYN) 5 MG tablet Take 5 mg by mouth in the morning. (0800)     metoprolol tartrate (LOPRESSOR) 50 MG tablet Take 50 mg by mouth 2 (two) times daily. (0800 & 2000)     montelukast (SINGULAIR) 10 MG tablet Take 10 mg by mouth at bedtime. (2000)     polyethylene glycol powder (GLYCOLAX/MIRALAX) 17 GM/SCOOP powder Take 17 g by mouth daily as needed for mild constipation or moderate constipation.     potassium chloride SA (KLOR-CON) 20 MEQ tablet Take 20 mEq by mouth 3 (three) times daily. (0800, 1400 & 2000)     rosuvastatin (CRESTOR) 20 MG tablet TAKE (1) TABLET BY MOUTH ONCE DAILY. (Patient taking differently: Take 20 mg by mouth daily at 8 pm. (2000)) 30 tablet 3   triamcinolone cream (KENALOG) 0.5 % Apply 1 application topically every 12 (twelve) hours as needed (applied to bilateral legs for allergic dermatitis).     Vitamin D, Cholecalciferol, 25 MCG (1000 UT) TABS Take 2,000 Units by mouth daily. (0800) 60 tablet 6   No current facility-administered medications for this visit.    REVIEW OF SYSTEMS:   .10 Point review of Systems was done is negative except as noted above.  PHYSICAL EXAMINATION: ECOG PERFORMANCE STATUS: 3 - Symptomatic, >50% confined to bed  . Vitals:   01/28/21 1419  BP: 119/65  Pulse: 66  Resp: 17  Temp: 98.1 F (36.7 C)  SpO2: 100%   Filed Weights   01/28/21 1419  Weight: 259 lb 14.4 oz (117.9 kg)   .Body mass index is 46.04 kg/m.   Marland Kitchen GENERAL:alert, in no acute distress and comfortable SKIN: no acute rashes, no significant lesions EYES: conjunctiva are pink and  non-injected, sclera anicteric OROPHARYNX: MMM, no exudates, no oropharyngeal erythema or ulceration NECK: supple, no JVD LYMPH:  no palpable lymphadenopathy in the cervical, axillary or inguinal regions LUNGS: clear to auscultation b/l with normal respiratory effort HEART: regular rate & rhythm ABDOMEN:  normoactive bowel sounds , non tender, not distended. Extremity: no pedal edema PSYCH: alert & oriented x 3 with fluent speech NEURO: no focal motor/sensory deficits   LABORATORY DATA:  I have reviewed the data as listed  . CBC Latest Ref Rng & Units 01/28/2021 12/13/2020 06/04/2020  WBC 4.0 - 10.5 K/uL 10.5 8.0 8.2  Hemoglobin 12.0 - 15.0 g/dL 10.1(L) 10.6(L) 9.8(L)  Hematocrit 36.0 - 46.0 % 31.9(L) 34.5(L) 31.6(L)  Platelets 150 - 400 K/uL 282 238 250    . CMP Latest Ref Rng & Units 01/28/2021 11/19/2020 06/04/2020  Glucose 70 - 99 mg/dL 204(H) 176(H) 261(H)  BUN 8 - 23 mg/dL 45(H) 46(H) 49(H)  Creatinine 0.44 - 1.00 mg/dL 1.92(H) 1.60(H) 2.09(H)  Sodium 135 - 145 mmol/L 137 142 136  Potassium 3.5 - 5.1 mmol/L 3.2(L) 3.5 3.6  Chloride 98 - 111 mmol/L 96(L) 97 96(L)  CO2 22 - 32 mmol/L 31 25 32  Calcium 8.9 - 10.3 mg/dL 9.4 8.7 9.2  Total Protein 6.5 - 8.1 g/dL 7.5 6.6 7.7  Total Bilirubin 0.3 - 1.2 mg/dL 0.5 0.2 0.3  Alkaline Phos 38 - 126 U/L 81 93 81  AST 15 - 41 U/L 11(L) 13 13(L)  ALT 0 - 44 U/L 9 10 11     05/26/18 Biopsy:    RADIOGRAPHIC STUDIES: I have  personally reviewed the radiological images as listed and agreed with the findings in the report. No results found.   ASSESSMENT & PLAN:  65 y.o. female with  1.  Typical carcinoid tumor - lung primary   2.  Anemia: Likely due to her renal disease. No transfusion is indicated.   She is currently on Aranesp.   3.  Kidney disease:  Management per nephrology.  PLAN: -Discussed pt labwork, Labs today CBC stable with hemoglobin of 10.1 WBC count of 10.5k and platelets of 282k CMP stable with a creatinine of 1.92  potassium was 3.2 Chromogranin level today is at 199.5 which is stable from 8 months ago when it was 202.5 -Patient has been evaluated by radiation oncology, interventional pulmonary cardiothoracic surgery. -Dr. Kipp Brood from cardiothoracic surgery has ordered a repeat CT chest. -Labs today are stable and there is no lab or clinical evidence of significant progression of the patient's pulmonary carcinoid. -No medical oncology intervention at this time.   FOLLOW UP: RTC with Dr Irene Limbo with labs in 6 months   All of the patients questions were answered with apparent satisfaction. The patient knows to call the clinic with any problems, questions or concerns.  . The total time spent in the appointment was 20 minutes and more than 50% was on counseling and direct patient cares.      Sullivan Lone MD [place] AAHIVMS [facility] Hematology/Oncology Physician [facility]

## 2021-02-11 ENCOUNTER — Other Ambulatory Visit: Payer: Self-pay

## 2021-02-11 ENCOUNTER — Other Ambulatory Visit: Payer: Self-pay | Admitting: Thoracic Surgery (Cardiothoracic Vascular Surgery)

## 2021-02-11 ENCOUNTER — Ambulatory Visit
Admission: RE | Admit: 2021-02-11 | Discharge: 2021-02-11 | Disposition: A | Discharge status: Home / Self Care | Payer: Medicare Other | Source: Ambulatory Visit | Attending: Thoracic Surgery (Cardiothoracic Vascular Surgery) | Admitting: Thoracic Surgery (Cardiothoracic Vascular Surgery)

## 2021-02-11 DIAGNOSIS — Z20828 Contact with and (suspected) exposure to other viral communicable diseases: Secondary | ICD-10-CM | POA: Diagnosis not present

## 2021-02-11 DIAGNOSIS — C7A090 Malignant carcinoid tumor of the bronchus and lung: Secondary | ICD-10-CM

## 2021-02-11 DIAGNOSIS — I700 Atherosclerosis of aorta: Secondary | ICD-10-CM | POA: Diagnosis not present

## 2021-02-11 DIAGNOSIS — U071 COVID-19: Secondary | ICD-10-CM | POA: Diagnosis not present

## 2021-02-11 DIAGNOSIS — R911 Solitary pulmonary nodule: Secondary | ICD-10-CM | POA: Diagnosis not present

## 2021-02-11 DIAGNOSIS — J9811 Atelectasis: Secondary | ICD-10-CM | POA: Diagnosis not present

## 2021-02-11 NOTE — Progress Notes (Signed)
Kidney function elevated, unable to do contrast dye at this time. Will do chest CT without contrast.

## 2021-02-13 DIAGNOSIS — Z0001 Encounter for general adult medical examination with abnormal findings: Secondary | ICD-10-CM | POA: Diagnosis not present

## 2021-02-13 DIAGNOSIS — E1165 Type 2 diabetes mellitus with hyperglycemia: Secondary | ICD-10-CM | POA: Diagnosis not present

## 2021-02-13 DIAGNOSIS — I10 Essential (primary) hypertension: Secondary | ICD-10-CM | POA: Diagnosis not present

## 2021-02-13 DIAGNOSIS — N1831 Chronic kidney disease, stage 3a: Secondary | ICD-10-CM | POA: Diagnosis not present

## 2021-02-13 DIAGNOSIS — E1122 Type 2 diabetes mellitus with diabetic chronic kidney disease: Secondary | ICD-10-CM | POA: Diagnosis not present

## 2021-02-14 ENCOUNTER — Other Ambulatory Visit: Payer: Self-pay

## 2021-02-14 ENCOUNTER — Other Ambulatory Visit: Payer: Self-pay | Admitting: "Endocrinology

## 2021-02-14 ENCOUNTER — Ambulatory Visit (INDEPENDENT_AMBULATORY_CARE_PROVIDER_SITE_OTHER): Payer: Medicare Other | Admitting: Thoracic Surgery (Cardiothoracic Vascular Surgery)

## 2021-02-14 DIAGNOSIS — C7A090 Malignant carcinoid tumor of the bronchus and lung: Secondary | ICD-10-CM | POA: Diagnosis not present

## 2021-02-14 NOTE — Progress Notes (Signed)
     [address]       [name] [address]             [phone]       Patient: Home Provider: Office Consent for Telemedicine visit obtained.  Today's visit was completed via a real-time telehealth (see specific modality noted below). The patient/authorized person provided oral consent at the time of the visit to engage in a telemedicine encounter with the present provider at [facility]. The patient/authorized person was informed of the potential benefits, limitations, and risks of telemedicine. The patient/authorized person expressed understanding that the laws that protect confidentiality also apply to telemedicine. The patient/authorized person acknowledged understanding that telemedicine does not provide emergency services and that he or she would need to call 911 or proceed to the nearest hospital for help if such a need arose.   Total time spent in the clinical discussion 10 minutes.  Telehealth Modality: Phone visit (audio only)  I had a telephone visit with [name].  She is a 65 year old female with a left lower lobe carcinoid tumor that is been followed and treated endoscopically by Dr. Valeta Harms.  She was originally sent to [place] to assess the possibility of surgical resection.  Cross-sectional imaging was repeated and reviewed.  The tumor does appear stable.  Regards to her symptoms her cough has improved significantly and she is not very short of breath outside of her baseline.  Given the complexity of her operation and her debility, I do not think that surgical resection would be the best option for her.  I explained this to her and her case Freight forwarder.  They will follow-up with me as needed.  They will continue to follow-up with Dr. Valeta Harms.  [name] Bary Leriche

## 2021-02-17 DIAGNOSIS — Z20828 Contact with and (suspected) exposure to other viral communicable diseases: Secondary | ICD-10-CM | POA: Diagnosis not present

## 2021-02-17 DIAGNOSIS — U071 COVID-19: Secondary | ICD-10-CM | POA: Diagnosis not present

## 2021-02-24 NOTE — Progress Notes (Signed)
Cardiology Office Note  Date: 02/25/2021   ID: [name], DOB Dec 23, 1955, MRN [id number]  PCP:  Rosita Fire, MD  Cardiologist:  Rozann Lesches, MD Electrophysiologist:  None   Chief Complaint  Patient presents with   Cardiac follow-up    History of Present Illness: [name] is a 65 y.o. female last seen in May.  She is here for a routine visit, continues to reside at [name].  Weight has been relatively stable, down a few pounds over the last few months.  She does not report any progressive shortness of breath with limited activity.  No regular sense of palpitations with atrial fibrillation.  She is following with oncology with bronchopulmonary carcinoid.  She is not felt to be a good candidate for surgery, tumor is relatively slow growing.  She continues to follow with Dr. Theador Hawthorne, recent BUN/creatinine 45 and 1.92 respectively.  I reviewed her current medications which are noted below.  Past Medical History:  Diagnosis Date   Arthritis    Asthma    Atrial fibrillation ([place])    Carcinoid tumor determined by biopsy of lung 05/2018   Left lung   Chronic diastolic CHF (congestive heart failure) ([place])    CKD (chronic kidney disease), stage III ([place])    Diabetes mellitus type 2 in obese [facility])    Essential hypertension    Iron deficiency anemia 10/16/2010   Mental handicap 10/16/2010   Mild CAD 2013   Morbid obesity ([facility])    OSA (obstructive sleep apnea)     Past Surgical History:  Procedure Laterality Date   ABDOMINAL HYSTERECTOMY     AV FISTULA PLACEMENT Right 06/06/2018   Procedure: ARTERIOVENOUS (AV) FISTULA CREATION;  Surgeon: Marty Heck, MD;  Location: [place];  Service: Vascular;  Laterality: Right;   BRONCHIAL DILITATION  12/13/2020   Procedure: BRONCHIAL DILITATION;  Surgeon: Garner Nash, DO;  Location: [facility];  Service: Pulmonary;;   CESAREAN SECTION     CHOLECYSTECTOMY     COLONOSCOPY  08/2010   normal TI, sigmoid polyp  (adenoma ). Next TCS due  08/2015,   COLONOSCOPY WITH PROPOFOL N/A 07/08/2020   Procedure: COLONOSCOPY WITH PROPOFOL;  Surgeon: Daneil Dolin, MD;  [facility];  Service: Endoscopy;  Laterality: N/A;  AM (diabetic and facility patient)   ESOPHAGOGASTRODUODENOSCOPY  08/2010   antral and duodenal erosions s/p bx (chronic gastritis, no h.pylori, no celiac dz ), hiatal hernia   FISTULA SUPERFICIALIZATION Right 08/25/2018   Procedure: FISTULA SUPERFICIALIZATION RIGHT ARM;  Surgeon: Waynetta Sandy, MD;  Location: [place];  Service: Cardiovascular;  Laterality: Right;   IR FLUORO GUIDE CV LINE RIGHT  05/31/2018   IR US GUIDE VASC ACCESS RIGHT  05/31/2018   KNEE SURGERY     right knee @ 65 years of age   LEFT AND RIGHT HEART CATHETERIZATION WITH CORONARY ANGIOGRAM N/A 09/21/2011   Procedure: LEFT AND RIGHT HEART CATHETERIZATION WITH CORONARY ANGIOGRAM;  Surgeon: Birdie Riddle, MD;  Location: [facility];  Service: Cardiovascular;  Laterality: N/A;   POLYPECTOMY  07/08/2020   Procedure: POLYPECTOMY INTESTINAL;  Surgeon: Daneil Dolin, MD;  [facility];  Service: Endoscopy;;   VIDEO BRONCHOSCOPY Left 12/13/2020   Procedure: VIDEO BRONCHOSCOPY WITHOUT FLUORO;  Surgeon: Garner Nash, DO;  Location: [place];  Service: Pulmonary;  Laterality: Left;  Cryotherapy    Current Outpatient Medications  Medication Sig Dispense Refill   acetaminophen (TYLENOL) 325 MG tablet Take  325 mg by mouth every 12 (twelve) hours as needed for moderate pain.     albuterol (PROVENTIL HFA;VENTOLIN HFA) 108 (90 Base) MCG/ACT inhaler Inhale 2 puffs into the lungs every 6 (six) hours as needed for shortness of breath.     apixaban (ELIQUIS) 5 MG TABS tablet Take 1 tablet (5 mg total) by mouth 2 (two) times daily. 60 tablet 0   benzonatate (TESSALON) 100 MG capsule Take 1 capsule (100 mg total) by mouth 2 (two) times daily as needed for cough. 60 capsule 0   betamethasone dipropionate (DIPROLENE)  0.05 % ointment Apply 1 application topically daily as needed (for rash under breasts).      calcitRIOL (ROCALTROL) 0.25 MCG capsule Take 0.25 mcg by mouth every Monday, Wednesday, and Friday.     cetirizine (ZYRTEC) 10 MG tablet Take 10 mg by mouth daily. (0800)     Cholecalciferol (VITAMIN D3) 50 MCG (2000 UT) TABS TAKE 1 TABLET BY MOUTH ONCE DAILY. 30 tablet 2   clotrimazole (LOTRIMIN) 1 % cream Apply 1 application topically daily as needed (under breasts as needed for rash).     DIABETIC TUSSIN EX 100 MG/5ML syrup Take 100 mg by mouth every 4 (four) hours as needed for cough or congestion.      diltiazem (CARDIZEM CD) 240 MG 24 hr capsule Take 1 capsule (240 mg total) by mouth daily. (Patient taking differently: Take 240 mg by mouth daily. (0800)) 30 capsule 0   DROPSAFE SAFETY PEN NEEDLES 31G X 6 MM MISC      EASYMAX TEST test strip CHECK BLOOD SUGAR 4 TIMES A DAY. (BREAKFAST, LUNCH, SUPPER & BEDTIME) 100 strip 5   fluticasone (FLONASE) 50 MCG/ACT nasal spray Place 2 sprays into both nostrils daily as needed for allergies or rhinitis.     Fluticasone-Umeclidin-Vilant 100-62.5-25 MCG/INH AEPB Inhale 1 puff into the lungs daily. (0800)     furosemide (LASIX) 40 MG tablet Take 40 mg by mouth See admin instructions. Take 40 mg twice daily, alternating with 40 mg daily every other day     guaiFENesin (MUCINEX) 600 MG 12 hr tablet Take 1 tablet (600 mg total) by mouth 2 (two) times daily as needed. (Patient taking differently: Take 600 mg by mouth 2 (two) times daily as needed (cough/congestion).) 60 tablet 11   HUMULIN R U-500 KWIKPEN 500 UNIT/ML KwikPen INJECT 40 UNITS SUBCUTANEOUSLY AT BREAKFAST AND SUPPER: INJECT 30 UNITS AT LUNCH: ONLY WHEN GLUCOSE IS ABOVE 90 & EATING.(HOLD IF BS BELOW 70: CALL MD IF BSABOVE 6 mL 0   ipratropium-albuterol (DUONEB) 0.5-2.5 (3) MG/3ML SOLN Take 3 mLs by nebulization every 6 (six) hours as needed (for cough).      isosorbide dinitrate (ISORDIL) 10 MG tablet Take 1  tablet (10 mg total) by mouth 3 (three) times daily. (Patient taking differently: Take 10 mg by mouth 3 (three) times daily. (0800, 1400 & 2000)) 90 tablet 0   Lancets 28G MISC CHECK BLOOD SUGAR 4 TIMES A DAY. (BREAKFAST, LUNCH, SUPPER, & BEDTIME) 100 each 5   losartan (COZAAR) 25 MG tablet Take 25 mg by mouth daily.     metolazone (ZAROXOLYN) 5 MG tablet Take 5 mg by mouth in the morning. (0800)     metoprolol tartrate (LOPRESSOR) 50 MG tablet Take 50 mg by mouth 2 (two) times daily. (0800 & 2000)     montelukast (SINGULAIR) 10 MG tablet Take 10 mg by mouth at bedtime. (2000)     polyethylene glycol powder (GLYCOLAX/MIRALAX) 17  GM/SCOOP powder Take 17 g by mouth daily as needed for mild constipation or moderate constipation.     potassium chloride SA (KLOR-CON) 20 MEQ tablet Take 20 mEq by mouth 3 (three) times daily. (0800, 1400 & 2000)     rosuvastatin (CRESTOR) 20 MG tablet TAKE (1) TABLET BY MOUTH ONCE DAILY. (Patient taking differently: Take 20 mg by mouth daily at 8 pm. (2000)) 30 tablet 3   triamcinolone cream (KENALOG) 0.5 % Apply 1 application topically every 12 (twelve) hours as needed (applied to bilateral legs for allergic dermatitis).     Vitamin D, Cholecalciferol, 25 MCG (1000 UT) TABS Take 2,000 Units by mouth daily. (0800) 60 tablet 6   No current facility-administered medications for this visit.   Allergies:  Patient has no known allergies.   ROS: No syncope.  Physical Exam: VS:  BP 118/64   Pulse 66   Ht 5\' 5"  (1.651 m)   Wt 254 lb 6.4 oz (115.4 kg)   SpO2 99%   BMI 42.33 kg/m , BMI Body mass index is 42.33 kg/m.  Wt Readings from Last 3 Encounters:  02/25/21 254 lb 6.4 oz (115.4 kg)  01/28/21 259 lb 14.4 oz (117.9 kg)  01/10/21 260 lb (117.9 kg)    General: Patient appears comfortable at rest. HEENT: Conjunctiva and lids normal, wearing a mask. Neck: Supple, no elevated JVP or carotid bruits, no thyromegaly. Lungs: Clear to auscultation, nonlabored breathing  at rest. Cardiac: Regular rate and rhythm, no S3, 1/6 systolic murmur Extremities: No pitting edema.  ECG:  An ECG dated 12/13/2020 was personally reviewed today and demonstrated:  Atrial fibrillation with prolonged QT interval.  Recent Labwork: 04/23/2020: TSH 2.300 01/28/2021: ALT 9; AST 11; BUN 45; Creatinine 1.92; Hemoglobin 10.1; Platelet Count 282; Potassium 3.2; Sodium 137     Component Value Date/Time   CHOL 127 05/07/2020 0000   CHOL 128 04/23/2020 0820   TRIG 150 05/07/2020 0000   HDL 42 05/07/2020 0000   HDL 43 04/23/2020 0820   CHOLHDL 3.0 04/23/2020 0820   CHOLHDL 5.5 (H) 09/12/2019 0831   VLDL 59 (H) 06/02/2018 0029   LDLCALC 62 05/07/2020 0000   LDLCALC 60 04/23/2020 0820   LDLCALC 124 (H) 09/12/2019 0831    Other Studies Reviewed Today:  TEE 05/26/2018:  1. Calcified, heterogeneous, primarily immobile lesion on the left  coronary cusp, appears to be primarily on the ventricular aspect of the  left coronary cusp. In clip 33 where it is well seen it measures 0.98 cm x  0.56 cm. The lesion has the appearance  of a subacute or healed vegetation. Differential also includes calcified  nodule in the setting of chronic dialysis.   2. The left ventricle has normal systolic function, with an ejection  fraction of 60-65%. The cavity size was normal.   3. The right ventricle has normal systolc function. The cavity was  normal. There is no increase in right ventricular wall thickness.   4. The aortic valve is tricuspid. Aortic valve regurgitation is trivial  by color flow Doppler.   5. The tricuspid valve was normal in structure.   6. The pulmonic valve was normal in structure.   7. There is evidence of severe atherosclerotic plaque in the aortic arch  and descending aorta.   8. Tiny patent foramen ovale with left to right shunting across the  atrial septum by color flow Doppler.   9. The interatrial septum appears to be lipomatous.  10. No left atrial appendage  thrombus.    Assessment and Plan:  1.  Paroxysmal atrial fibrillation with CHA2DS2-VASc score of 4.  She remains on Eliquis for stroke prophylaxis, does not report any spontaneous bleeding problems.  Continue Lopressor and Cardizem CD.  2.  Chronic diastolic heart failure, LVEF 60 to 65% by last assessment.  Weight is stable.  She remains on Lasix with potassium supplement, dose modifications with assistance from nephrology as well.  Also on Zaroxolyn, Lopressor, and losartan.  3.  CKD stage IIIb-IV followed by Dr. Theador Hawthorne.  Most recent creatinine 1.92.  Medication Adjustments/Labs and Tests Ordered: Current medicines are reviewed at length with the patient today.  Concerns regarding medicines are outlined above.   Tests Ordered: No orders of the defined types were placed in this encounter.   Medication Changes: No orders of the defined types were placed in this encounter.   Disposition:  Follow up  6 months.  Signed, Satira Sark, MD, [facility] 02/25/2021 10:05 AM    [facility] [address], [place], [place] [address] Phone: [phone]; Fax: [phone]

## 2021-02-25 ENCOUNTER — Ambulatory Visit (INDEPENDENT_AMBULATORY_CARE_PROVIDER_SITE_OTHER): Payer: Medicare Other | Admitting: Cardiology

## 2021-02-25 ENCOUNTER — Other Ambulatory Visit: Payer: Self-pay

## 2021-02-25 ENCOUNTER — Encounter: Payer: Self-pay | Admitting: Cardiology

## 2021-02-25 VITALS — BP 118/64 | HR 66 | Ht 65.0 in | Wt 254.4 lb

## 2021-02-25 DIAGNOSIS — I5032 Chronic diastolic (congestive) heart failure: Secondary | ICD-10-CM

## 2021-02-25 DIAGNOSIS — I480 Paroxysmal atrial fibrillation: Secondary | ICD-10-CM | POA: Diagnosis not present

## 2021-02-25 DIAGNOSIS — N1832 Chronic kidney disease, stage 3b: Secondary | ICD-10-CM

## 2021-02-25 DIAGNOSIS — U071 COVID-19: Secondary | ICD-10-CM | POA: Diagnosis not present

## 2021-02-25 DIAGNOSIS — Z20828 Contact with and (suspected) exposure to other viral communicable diseases: Secondary | ICD-10-CM | POA: Diagnosis not present

## 2021-02-25 NOTE — Patient Instructions (Signed)
Medication Instructions:  Your physician recommends that you continue on your current medications as directed. Please refer to the Current Medication list given to you today.   Labwork: None today  Testing/Procedures: None today  Follow-Up: 6 months  Any Other Special Instructions Will Be Listed Below (If Applicable).  If you need a refill on your cardiac medications before your next appointment, please call your pharmacy.  

## 2021-03-12 DIAGNOSIS — I110 Hypertensive heart disease with heart failure: Secondary | ICD-10-CM | POA: Diagnosis not present

## 2021-03-12 DIAGNOSIS — I5032 Chronic diastolic (congestive) heart failure: Secondary | ICD-10-CM | POA: Diagnosis not present

## 2021-03-17 ENCOUNTER — Other Ambulatory Visit: Payer: Self-pay | Admitting: "Endocrinology

## 2021-03-25 DIAGNOSIS — M79674 Pain in right toe(s): Secondary | ICD-10-CM | POA: Diagnosis not present

## 2021-03-25 DIAGNOSIS — B351 Tinea unguium: Secondary | ICD-10-CM | POA: Diagnosis not present

## 2021-03-25 DIAGNOSIS — M79675 Pain in left toe(s): Secondary | ICD-10-CM | POA: Diagnosis not present

## 2021-03-31 DIAGNOSIS — S81802A Unspecified open wound, left lower leg, initial encounter: Secondary | ICD-10-CM | POA: Diagnosis not present

## 2021-03-31 DIAGNOSIS — I5032 Chronic diastolic (congestive) heart failure: Secondary | ICD-10-CM | POA: Diagnosis not present

## 2021-03-31 DIAGNOSIS — E1122 Type 2 diabetes mellitus with diabetic chronic kidney disease: Secondary | ICD-10-CM | POA: Diagnosis not present

## 2021-03-31 DIAGNOSIS — R600 Localized edema: Secondary | ICD-10-CM | POA: Diagnosis not present

## 2021-04-08 DIAGNOSIS — R809 Proteinuria, unspecified: Secondary | ICD-10-CM | POA: Diagnosis not present

## 2021-04-08 DIAGNOSIS — E211 Secondary hyperparathyroidism, not elsewhere classified: Secondary | ICD-10-CM | POA: Diagnosis not present

## 2021-04-08 DIAGNOSIS — N189 Chronic kidney disease, unspecified: Secondary | ICD-10-CM | POA: Diagnosis not present

## 2021-04-08 DIAGNOSIS — E1122 Type 2 diabetes mellitus with diabetic chronic kidney disease: Secondary | ICD-10-CM | POA: Diagnosis not present

## 2021-04-08 DIAGNOSIS — D631 Anemia in chronic kidney disease: Secondary | ICD-10-CM | POA: Diagnosis not present

## 2021-04-08 DIAGNOSIS — E1129 Type 2 diabetes mellitus with other diabetic kidney complication: Secondary | ICD-10-CM | POA: Diagnosis not present

## 2021-04-08 DIAGNOSIS — D381 Neoplasm of uncertain behavior of trachea, bronchus and lung: Secondary | ICD-10-CM | POA: Diagnosis not present

## 2021-04-08 DIAGNOSIS — I5032 Chronic diastolic (congestive) heart failure: Secondary | ICD-10-CM | POA: Diagnosis not present

## 2021-04-14 ENCOUNTER — Encounter: Payer: Self-pay | Admitting: "Endocrinology

## 2021-04-14 ENCOUNTER — Other Ambulatory Visit: Payer: Self-pay

## 2021-04-14 ENCOUNTER — Ambulatory Visit (INDEPENDENT_AMBULATORY_CARE_PROVIDER_SITE_OTHER): Payer: Medicare Other | Admitting: "Endocrinology

## 2021-04-14 VITALS — BP 124/74 | HR 60 | Ht 65.0 in | Wt 258.6 lb

## 2021-04-14 DIAGNOSIS — I10 Essential (primary) hypertension: Secondary | ICD-10-CM

## 2021-04-14 DIAGNOSIS — Z794 Long term (current) use of insulin: Secondary | ICD-10-CM | POA: Diagnosis not present

## 2021-04-14 DIAGNOSIS — N184 Chronic kidney disease, stage 4 (severe): Secondary | ICD-10-CM

## 2021-04-14 DIAGNOSIS — E1122 Type 2 diabetes mellitus with diabetic chronic kidney disease: Secondary | ICD-10-CM | POA: Diagnosis not present

## 2021-04-14 DIAGNOSIS — E782 Mixed hyperlipidemia: Secondary | ICD-10-CM | POA: Diagnosis not present

## 2021-04-14 LAB — POCT GLYCOSYLATED HEMOGLOBIN (HGB A1C): HbA1c, POC (controlled diabetic range): 6.6 % (ref 0.0–7.0)

## 2021-04-14 NOTE — Progress Notes (Signed)
04/14/2021  Endocrinology follow-up note   Subjective:    Patient ID: [name], female    DOB: Oct 13, 1955, PCP Rosita Fire, MD   Past Medical History:  Diagnosis Date   Arthritis    Asthma    Atrial fibrillation ([place])    Carcinoid tumor determined by biopsy of lung 05/2018   Left lung   Chronic diastolic CHF (congestive heart failure) ([place])    CKD (chronic kidney disease), stage III ([place])    Diabetes mellitus type 2 in obese [facility])    Essential hypertension    Iron deficiency anemia 10/16/2010   Mental handicap 10/16/2010   Mild CAD 2013   Morbid obesity ([place])    OSA (obstructive sleep apnea)    Past Surgical History:  Procedure Laterality Date   ABDOMINAL HYSTERECTOMY     AV FISTULA PLACEMENT Right 06/06/2018   Procedure: ARTERIOVENOUS (AV) FISTULA CREATION;  Surgeon: Marty Heck, MD;  Location: [place];  Service: Vascular;  Laterality: Right;   BRONCHIAL DILITATION  12/13/2020   Procedure: BRONCHIAL DILITATION;  Surgeon: Garner Nash, DO;  Location: [place] [facility];  Service: Pulmonary;;   CESAREAN SECTION     CHOLECYSTECTOMY     COLONOSCOPY  08/2010   normal TI, sigmoid polyp (adenoma ). Next TCS due  08/2015,   COLONOSCOPY WITH PROPOFOL N/A 07/08/2020   Procedure: COLONOSCOPY WITH PROPOFOL;  Surgeon: Daneil Dolin, MD;  [facility];  Service: Endoscopy;  Laterality: N/A;  AM (diabetic and facility patient)   ESOPHAGOGASTRODUODENOSCOPY  08/2010   antral and duodenal erosions s/p bx (chronic gastritis, no h.pylori, no celiac dz ), hiatal hernia   FISTULA SUPERFICIALIZATION Right 08/25/2018   Procedure: FISTULA SUPERFICIALIZATION RIGHT ARM;  Surgeon: Waynetta Sandy, MD;  Location: [place];  Service: Cardiovascular;  Laterality: Right;   IR FLUORO GUIDE CV LINE RIGHT  05/31/2018   IR US GUIDE VASC ACCESS RIGHT  05/31/2018   KNEE SURGERY     right knee @ 66 years of age   LEFT AND RIGHT HEART CATHETERIZATION WITH CORONARY ANGIOGRAM N/A  09/21/2011   Procedure: LEFT AND RIGHT HEART CATHETERIZATION WITH CORONARY ANGIOGRAM;  Surgeon: Birdie Riddle, MD;  Location: [place] [facility];  Service: Cardiovascular;  Laterality: N/A;   POLYPECTOMY  07/08/2020   Procedure: POLYPECTOMY INTESTINAL;  Surgeon: Daneil Dolin, MD;  [facility];  Service: Endoscopy;;   VIDEO BRONCHOSCOPY Left 12/13/2020   Procedure: VIDEO BRONCHOSCOPY WITHOUT FLUORO;  Surgeon: Garner Nash, DO;  Location: [place];  Service: Pulmonary;  Laterality: Left;  Cryotherapy   Social History   Socioeconomic History   Marital status: Divorced    Spouse name: Not on file   Number of children: 1   Years of education: Not on file   Highest education level: Not on file  Occupational History    Employer: UNEMPLOYED  Tobacco Use   Smoking status: Former    Packs/day: 1.00    Years: 0.00    Pack years: 0.00    Types: Cigarettes    Quit date: 04/11/1996    Years since quitting: 25.0   Smokeless tobacco: Never   Tobacco comments:    quit a couple year ago  Vaping Use   Vaping Use: Never used  Substance and Sexual Activity   Alcohol use: No   Drug use: No   Sexual activity: Never  Other Topics Concern   Not on file  Social History Narrative   Lives with parents.  Social Determinants of Health   Financial Resource Strain: Not on file  Food Insecurity: Not on file  Transportation Needs: Not on file  Physical Activity: Not on file  Stress: Not on file  Social Connections: Not on file   Outpatient Encounter Medications as of 04/14/2021  Medication Sig   acetaminophen (TYLENOL) 325 MG tablet Take 325 mg by mouth every 12 (twelve) hours as needed for moderate pain.   albuterol (PROVENTIL HFA;VENTOLIN HFA) 108 (90 Base) MCG/ACT inhaler Inhale 2 puffs into the lungs every 6 (six) hours as needed for shortness of breath.   apixaban (ELIQUIS) 5 MG TABS tablet Take 1 tablet (5 mg total) by mouth 2 (two) times daily.   benzonatate (TESSALON) 100 MG  capsule Take 1 capsule (100 mg total) by mouth 2 (two) times daily as needed for cough.   betamethasone dipropionate (DIPROLENE) 0.05 % ointment Apply 1 application topically daily as needed (for rash under breasts).    calcitRIOL (ROCALTROL) 0.25 MCG capsule Take 0.25 mcg by mouth every Monday, Wednesday, and Friday.   cetirizine (ZYRTEC) 10 MG tablet Take 10 mg by mouth daily. (0800)   Cholecalciferol (VITAMIN D3) 50 MCG (2000 UT) TABS TAKE 1 TABLET BY MOUTH ONCE DAILY.   clotrimazole (LOTRIMIN) 1 % cream Apply 1 application topically daily as needed (under breasts as needed for rash).   DIABETIC TUSSIN EX 100 MG/5ML syrup Take 100 mg by mouth every 4 (four) hours as needed for cough or congestion.    diltiazem (CARDIZEM CD) 240 MG 24 hr capsule Take 1 capsule (240 mg total) by mouth daily. (Patient taking differently: Take 240 mg by mouth daily. (0800))   DROPSAFE SAFETY PEN NEEDLES 31G X 6 MM MISC    EASYMAX TEST test strip CHECK BLOOD SUGAR 4 TIMES A DAY. (BREAKFAST, LUNCH, SUPPER & BEDTIME)   fluticasone (FLONASE) 50 MCG/ACT nasal spray Place 2 sprays into both nostrils daily as needed for allergies or rhinitis.   Fluticasone-Umeclidin-Vilant 100-62.5-25 MCG/INH AEPB Inhale 1 puff into the lungs daily. (0800)   furosemide (LASIX) 40 MG tablet Take 40 mg by mouth See admin instructions. Take 40 mg twice daily, alternating with 40 mg daily every other day   guaiFENesin (MUCINEX) 600 MG 12 hr tablet Take 1 tablet (600 mg total) by mouth 2 (two) times daily as needed. (Patient taking differently: Take 600 mg by mouth 2 (two) times daily as needed (cough/congestion).)   HUMULIN R U-500 KWIKPEN 500 UNIT/ML KwikPen INJECT 40 UNITS SUBCUTANEOUSLY AT BREAKFAST AND SUPPER: INJECT 30 UNITS AT LUNCH: ONLY WHEN GLUCOSE IS ABOVE 90 & EATING.(HOLD IF BS BELOW 70: CALL MD IF BSABOVE   ipratropium-albuterol (DUONEB) 0.5-2.5 (3) MG/3ML SOLN Take 3 mLs by nebulization every 6 (six) hours as needed (for cough).     isosorbide dinitrate (ISORDIL) 10 MG tablet Take 1 tablet (10 mg total) by mouth 3 (three) times daily. (Patient taking differently: Take 10 mg by mouth 3 (three) times daily. (0800, 1400 & 2000))   Lancets 28G MISC CHECK BLOOD SUGAR 4 TIMES A DAY. (BREAKFAST, LUNCH, SUPPER, & BEDTIME)   losartan (COZAAR) 25 MG tablet Take 25 mg by mouth daily.   metolazone (ZAROXOLYN) 5 MG tablet Take 5 mg by mouth in the morning. (0800)   metoprolol tartrate (LOPRESSOR) 50 MG tablet Take 50 mg by mouth 2 (two) times daily. (0800 & 2000)   montelukast (SINGULAIR) 10 MG tablet Take 10 mg by mouth at bedtime. (2000)   polyethylene glycol powder (GLYCOLAX/MIRALAX)  17 GM/SCOOP powder Take 17 g by mouth daily as needed for mild constipation or moderate constipation.   potassium chloride SA (KLOR-CON) 20 MEQ tablet Take 20 mEq by mouth 3 (three) times daily. (0800, 1400 & 2000)   rosuvastatin (CRESTOR) 20 MG tablet TAKE (1) TABLET BY MOUTH ONCE DAILY. (Patient taking differently: Take 20 mg by mouth daily at 8 pm. (2000))   triamcinolone cream (KENALOG) 0.5 % Apply 1 application topically every 12 (twelve) hours as needed (applied to bilateral legs for allergic dermatitis).   Vitamin D, Cholecalciferol, 25 MCG (1000 UT) TABS Take 2,000 Units by mouth daily. (0800)   No facility-administered encounter medications on file as of 04/14/2021.   ALLERGIES: No Known Allergies VACCINATION STATUS: Immunization History  Administered Date(s) Administered   Hepatitis B, adult 07/29/2018, 08/29/2018, 10/07/2018   Influenza Inj Mdck Quad Pf 12/19/2016   Influenza,inj,Quad PF,6+ Mos 02/02/2013, 05/15/2018   Influenza-Unspecified 05/15/2018   PPD Test 07/22/2018   Pneumococcal Polysaccharide-23 05/15/2018   Pneumococcal-Unspecified 05/15/2018    Diabetes She presents for her follow-up diabetic visit. She has type 2 diabetes mellitus. Onset time: she was diagnosed at approximate age of 89 years. Her disease course has been  improving. There are no hypoglycemic associated symptoms. Pertinent negatives for hypoglycemia include no confusion, headaches, pallor or seizures. Pertinent negatives for diabetes include no blurred vision, no chest pain, no fatigue, no polydipsia, no polyphagia and no polyuria. There are no hypoglycemic complications. Symptoms are improving. Diabetic complications include nephropathy. Risk factors for coronary artery disease include diabetes mellitus, dyslipidemia, hypertension, obesity and sedentary lifestyle. Current diabetic treatment includes intensive insulin program. Her weight is fluctuating minimally. She is following a generally unhealthy diet. She has had a previous visit with a dietitian. She never participates in exercise. Her home blood glucose trend is decreasing steadily. Her breakfast blood glucose range is generally 130-140 mg/dl. Her lunch blood glucose range is generally 140-180 mg/dl. Her dinner blood glucose range is generally 140-180 mg/dl. Her bedtime blood glucose range is generally 140-180 mg/dl. Her overall blood glucose range is 140-180 mg/dl. (She is accompanied by her aide from nursing home.  She presents with continued improvement in her glycemic profile.  She does have tight glycemic profile randomly at fasting.  Her point-of-care A1c is 6.6%, overall improving.     ) An ACE inhibitor/angiotensin II receptor blocker is being taken.  Hypertension This is a chronic problem. The current episode started more than 1 year ago. The problem is uncontrolled. Pertinent negatives include no blurred vision, chest pain, headaches, palpitations or shortness of breath. Risk factors for coronary artery disease include diabetes mellitus, dyslipidemia, obesity and sedentary lifestyle. Past treatments include ACE inhibitors. Hypertensive end-organ damage includes kidney disease. Identifiable causes of hypertension include chronic renal disease.  Hyperlipidemia This is a chronic problem. The  current episode started more than 1 year ago. The problem is uncontrolled. Exacerbating diseases include chronic renal disease, diabetes and obesity. Pertinent negatives include no chest pain, myalgias or shortness of breath. Current antihyperlipidemic treatment includes statins and fibric acid derivatives. Risk factors for coronary artery disease include diabetes mellitus, dyslipidemia, obesity, a sedentary lifestyle and hypertension.   Review of Systems  Constitutional:  Negative for chills, fatigue and fever.  HENT:  Negative for trouble swallowing and voice change.   Eyes:  Negative for blurred vision and visual disturbance.  Respiratory:  Negative for cough, shortness of breath and wheezing.   Cardiovascular:  Negative for chest pain, palpitations and leg swelling.  Gastrointestinal:  Negative for diarrhea, nausea and vomiting.  Endocrine: Negative for cold intolerance, heat intolerance, polydipsia, polyphagia and polyuria.  Musculoskeletal:  Positive for gait problem. Negative for arthralgias and myalgias.  Skin:  Negative for color change, pallor, rash and wound.  Neurological:  Negative for seizures and headaches.  Hematological:  Does not bruise/bleed easily.  Psychiatric/Behavioral:  Negative for confusion and suicidal ideas.    Objective:    BP 124/74    Pulse 60    Ht 5\' 5"  (1.651 m)    Wt 258 lb 9.6 oz (117.3 kg)    BMI 43.03 kg/m   Wt Readings from Last 3 Encounters:  04/14/21 258 lb 9.6 oz (117.3 kg)  02/25/21 254 lb 6.4 oz (115.4 kg)  01/28/21 259 lb 14.4 oz (117.9 kg)      Results for orders placed or performed in visit on 04/14/21  HgB A1c  Result Value Ref Range   Hemoglobin A1C     HbA1c POC (<> result, manual entry)     HbA1c, POC (prediabetic range)     HbA1c, POC (controlled diabetic range) 6.6 0.0 - 7.0 %   Complete Blood Count (Most recent): Lab Results  Component Value Date   WBC 10.5 01/28/2021   HGB 10.1 (L) 01/28/2021   HCT 31.9 (L) 01/28/2021    MCV 83.5 01/28/2021   PLT 282 01/28/2021   Chemistry (most recent): Diabetic Labs (most recent): Lab Results  Component Value Date   HGBA1C 6.6 04/14/2021   HGBA1C 6.5 12/11/2020   HGBA1C 7.4 05/07/2020   Lipid Panel     Component Value Date/Time   CHOL 127 05/07/2020 0000   CHOL 128 04/23/2020 0820   TRIG 150 05/07/2020 0000   HDL 42 05/07/2020 0000   HDL 43 04/23/2020 0820   CHOLHDL 3.0 04/23/2020 0820   CHOLHDL 5.5 (H) 09/12/2019 0831   VLDL 59 (H) 06/02/2018 0029   LDLCALC 62 05/07/2020 0000   LDLCALC 60 04/23/2020 0820   LDLCALC 124 (H) 09/12/2019 0831    Assessment & Plan:   1. Type 2 diabetes mellitus with stage 3-4 chronic kidney disease, with long-term current use of insulin   -Her diabetes is  complicated by CKD, congestive heart failure,  obesity/sedentary life, and patient remains at extremely high risk for more acute and chronic complications of diabetes which include CAD, CVA, CKD, retinopathy, and neuropathy. These are all discussed in detail with the patient.  She is accompanied by her aide from nursing home.  She presents with continued improvement in her glycemic profile.  She does have tight glycemic profile randomly at fasting.  Her point-of-care A1c is 6.6%, overall improving.    - I have re-counseled the patient on diet management and weight loss  by adopting a carbohydrate restricted / protein rich  Diet.  - she acknowledges that there is a room for improvement in her food and drink choices. - Suggestion is made for her to avoid simple carbohydrates  from her diet including Cakes, Sweet Desserts, Ice Cream, Soda (diet and regular), Sweet Tea, Candies, Chips, Cookies, Store Bought Juices, Alcohol in Excess of  1-2 drinks a day, Artificial Sweeteners,  Coffee Creamer, and "Sugar-free" Products, Lemonade. This will help patient to have more stable blood glucose profile and potentially avoid unintended weight gain.  - Patient is advised to stick to a routine  mealtimes to eat 3 meals  a day and avoid unnecessary snacks ( to snack only to correct hypoglycemia).  - I  have approached patient with the following individualized plan to manage diabetes and patient agrees.  -Her placement in group home is the best development for her lately.  -Her presenting glycemic profile is near or on target, with point-of-care A1c of 6.5%.     -It is better for her to stay on Humulin RU 500 insulin for the sake of simplicity.  -She is advised to lower her Humulin U50 0 to 40 units at breakfast, 30 units with lunch, 30 units with supper  when glucose is above 90 mg/dl associated with monitoring of blood glucose before meals and at bedtime. - She is advised to skip insulin if pre-meal blood glucose is below 90 mg/dL or if she is not eating. -She is not a candidate for metformin.   - Patient specific target  for A1c; LDL, HDL, Triglycerides, were discussed in detail.  2) BP/HTN:  -Her blood pressure is controlled to target.  She has urine microalbuminuria .She is advised on  salt restriction  and I advised her to continue current medications including lisinopril 20 mg p.o. daily, hydralazine 25 mg p.o. 3 times daily, furosemide 40 mg p.o. Daily.  3) Lipids/HPL: Her recent lipid panel showed loss of control of dyslipidemia, LDL at 124 increasing from 80.  She is tolerating Crestor, advised to continue Crestor 20 mg p.o. nightly.  Side effects and precautions discussed with her.     She is also on fenofibrate.     4)  Weight/Diet: Her BMI is 43.0--- -clearly complicating her diabetes care.   She is a candidate for modest weight loss.  CDE consult in progress, exercise, and carbohydrates information provided.  5) Chronic Care/Health Maintenance:  -Patient is  on ACEI and encouraged to continue to follow up with Ophthalmology, Podiatrist at least yearly or according to recommendations, and advised to stay away from smoking. I have recommended yearly flu vaccine and  pneumonia vaccination at least every 5 years;  and  sleep for at least 7 hours a day.    -She is advised to maintain close follow-up with her nephrologist.  - I advised patient to maintain close follow up with Rosita Fire, MD for primary care needs.   I spent 41 minutes in the care of the patient today including review of labs from [place] [facility], Lipids, Thyroid Function, Hematology (current and previous including abstractions from other facilities); face-to-face time discussing  her blood glucose readings/logs, discussing hypoglycemia and hyperglycemia episodes and symptoms, medications doses, her options of short and long term treatment based on the latest standards of care / guidelines;  discussion about incorporating lifestyle medicine;  and documenting the encounter.    Please refer to Patient Instructions for Blood Glucose Monitoring and Insulin/Medications Dosing Guide"  in media tab for additional information. Please  also refer to " Patient Self Inventory" in the Media  tab for reviewed elements of pertinent patient history.  [name] participated in the discussions, expressed understanding, and voiced agreement with the above plans.  All questions were answered to her satisfaction. she is encouraged to contact clinic should she have any questions or concerns prior to her return visit.     Follow up plan: -Return in about 3 months (around 07/13/2021) for Bring Meter and Logs- A1c in Office.  Glade Lloyd, MD Phone: [phone]  Fax: [phone]  This note was partially dictated with voice recognition software. Similar sounding words can be transcribed inadequately or may not  be corrected upon review.  04/14/2021, 5:08 PM

## 2021-04-14 NOTE — Patient Instructions (Signed)

## 2021-04-16 DIAGNOSIS — R809 Proteinuria, unspecified: Secondary | ICD-10-CM | POA: Diagnosis not present

## 2021-04-16 DIAGNOSIS — I5032 Chronic diastolic (congestive) heart failure: Secondary | ICD-10-CM | POA: Diagnosis not present

## 2021-04-16 DIAGNOSIS — E876 Hypokalemia: Secondary | ICD-10-CM | POA: Diagnosis not present

## 2021-04-16 DIAGNOSIS — N189 Chronic kidney disease, unspecified: Secondary | ICD-10-CM | POA: Diagnosis not present

## 2021-04-16 DIAGNOSIS — D631 Anemia in chronic kidney disease: Secondary | ICD-10-CM | POA: Diagnosis not present

## 2021-04-16 DIAGNOSIS — E1129 Type 2 diabetes mellitus with other diabetic kidney complication: Secondary | ICD-10-CM | POA: Diagnosis not present

## 2021-04-16 DIAGNOSIS — E211 Secondary hyperparathyroidism, not elsewhere classified: Secondary | ICD-10-CM | POA: Diagnosis not present

## 2021-04-16 DIAGNOSIS — D508 Other iron deficiency anemias: Secondary | ICD-10-CM | POA: Diagnosis not present

## 2021-04-16 DIAGNOSIS — E1122 Type 2 diabetes mellitus with diabetic chronic kidney disease: Secondary | ICD-10-CM | POA: Diagnosis not present

## 2021-04-21 ENCOUNTER — Encounter: Payer: Self-pay | Admitting: Internal Medicine

## 2021-04-22 ENCOUNTER — Encounter (HOSPITAL_COMMUNITY): Payer: Self-pay

## 2021-04-22 ENCOUNTER — Encounter (HOSPITAL_COMMUNITY)
Admission: RE | Admit: 2021-04-22 | Discharge: 2021-04-22 | Disposition: A | Discharge status: Home / Self Care | Payer: Medicare Other | Source: Ambulatory Visit | Attending: Nephrology | Admitting: Nephrology

## 2021-04-22 DIAGNOSIS — D509 Iron deficiency anemia, unspecified: Secondary | ICD-10-CM | POA: Insufficient documentation

## 2021-04-22 MED ORDER — SODIUM CHLORIDE 0.9 % IV SOLN
510.0000 mg | Freq: Once | INTRAVENOUS | Status: AC
  Administered 2021-04-22: 13:00:00 510 mg via INTRAVENOUS
  Filled 2021-04-22: qty 510

## 2021-04-22 MED ORDER — SODIUM CHLORIDE 0.9 % IV SOLN: Freq: Once | INTRAVENOUS | Status: AC

## 2021-04-29 ENCOUNTER — Encounter (HOSPITAL_COMMUNITY)
Admission: RE | Admit: 2021-04-29 | Discharge: 2021-04-29 | Disposition: A | Discharge status: Home / Self Care | Payer: Medicare Other | Source: Ambulatory Visit | Attending: Nephrology | Admitting: Nephrology

## 2021-04-29 DIAGNOSIS — D509 Iron deficiency anemia, unspecified: Secondary | ICD-10-CM | POA: Diagnosis not present

## 2021-04-29 MED ORDER — SODIUM CHLORIDE 0.9 % IV SOLN: INTRAVENOUS | Status: DC

## 2021-04-29 MED ORDER — SODIUM CHLORIDE 0.9 % IV SOLN
510.0000 mg | Freq: Once | INTRAVENOUS | Status: AC
  Administered 2021-04-29: 510 mg via INTRAVENOUS
  Filled 2021-04-29: qty 510

## 2021-05-01 DIAGNOSIS — I110 Hypertensive heart disease with heart failure: Secondary | ICD-10-CM | POA: Diagnosis not present

## 2021-05-01 DIAGNOSIS — E1122 Type 2 diabetes mellitus with diabetic chronic kidney disease: Secondary | ICD-10-CM | POA: Diagnosis not present

## 2021-05-15 ENCOUNTER — Other Ambulatory Visit: Payer: Self-pay | Admitting: "Endocrinology

## 2021-05-15 DIAGNOSIS — E1122 Type 2 diabetes mellitus with diabetic chronic kidney disease: Secondary | ICD-10-CM

## 2021-05-15 DIAGNOSIS — Z794 Long term (current) use of insulin: Secondary | ICD-10-CM

## 2021-05-26 DIAGNOSIS — Z794 Long term (current) use of insulin: Secondary | ICD-10-CM | POA: Diagnosis not present

## 2021-05-26 DIAGNOSIS — Z7984 Long term (current) use of oral hypoglycemic drugs: Secondary | ICD-10-CM | POA: Diagnosis not present

## 2021-05-26 DIAGNOSIS — H25813 Combined forms of age-related cataract, bilateral: Secondary | ICD-10-CM | POA: Diagnosis not present

## 2021-05-26 DIAGNOSIS — E119 Type 2 diabetes mellitus without complications: Secondary | ICD-10-CM | POA: Diagnosis not present

## 2021-05-29 DIAGNOSIS — I110 Hypertensive heart disease with heart failure: Secondary | ICD-10-CM | POA: Diagnosis not present

## 2021-05-29 DIAGNOSIS — E1165 Type 2 diabetes mellitus with hyperglycemia: Secondary | ICD-10-CM | POA: Diagnosis not present

## 2021-06-09 DIAGNOSIS — B351 Tinea unguium: Secondary | ICD-10-CM | POA: Diagnosis not present

## 2021-06-09 DIAGNOSIS — M79674 Pain in right toe(s): Secondary | ICD-10-CM | POA: Diagnosis not present

## 2021-06-09 DIAGNOSIS — M79675 Pain in left toe(s): Secondary | ICD-10-CM | POA: Diagnosis not present

## 2021-06-16 ENCOUNTER — Other Ambulatory Visit: Payer: Self-pay | Admitting: "Endocrinology

## 2021-06-19 DIAGNOSIS — D508 Other iron deficiency anemias: Secondary | ICD-10-CM | POA: Diagnosis not present

## 2021-06-19 DIAGNOSIS — E211 Secondary hyperparathyroidism, not elsewhere classified: Secondary | ICD-10-CM | POA: Diagnosis not present

## 2021-06-19 DIAGNOSIS — R809 Proteinuria, unspecified: Secondary | ICD-10-CM | POA: Diagnosis not present

## 2021-06-19 DIAGNOSIS — E876 Hypokalemia: Secondary | ICD-10-CM | POA: Diagnosis not present

## 2021-06-19 DIAGNOSIS — D631 Anemia in chronic kidney disease: Secondary | ICD-10-CM | POA: Diagnosis not present

## 2021-06-19 DIAGNOSIS — E1129 Type 2 diabetes mellitus with other diabetic kidney complication: Secondary | ICD-10-CM | POA: Diagnosis not present

## 2021-06-19 DIAGNOSIS — I5032 Chronic diastolic (congestive) heart failure: Secondary | ICD-10-CM | POA: Diagnosis not present

## 2021-06-19 DIAGNOSIS — N189 Chronic kidney disease, unspecified: Secondary | ICD-10-CM | POA: Diagnosis not present

## 2021-06-19 DIAGNOSIS — E1122 Type 2 diabetes mellitus with diabetic chronic kidney disease: Secondary | ICD-10-CM | POA: Diagnosis not present

## 2021-06-29 DIAGNOSIS — I5032 Chronic diastolic (congestive) heart failure: Secondary | ICD-10-CM | POA: Diagnosis not present

## 2021-06-29 DIAGNOSIS — E1122 Type 2 diabetes mellitus with diabetic chronic kidney disease: Secondary | ICD-10-CM | POA: Diagnosis not present

## 2021-07-14 ENCOUNTER — Ambulatory Visit: Payer: Medicare Other | Admitting: "Endocrinology

## 2021-07-18 ENCOUNTER — Other Ambulatory Visit: Payer: Self-pay | Admitting: "Endocrinology

## 2021-07-18 DIAGNOSIS — E1122 Type 2 diabetes mellitus with diabetic chronic kidney disease: Secondary | ICD-10-CM

## 2021-07-23 ENCOUNTER — Ambulatory Visit: Payer: Medicare Other | Admitting: "Endocrinology

## 2021-07-23 DIAGNOSIS — J441 Chronic obstructive pulmonary disease with (acute) exacerbation: Secondary | ICD-10-CM | POA: Diagnosis not present

## 2021-07-23 DIAGNOSIS — I110 Hypertensive heart disease with heart failure: Secondary | ICD-10-CM | POA: Diagnosis not present

## 2021-07-23 DIAGNOSIS — E1122 Type 2 diabetes mellitus with diabetic chronic kidney disease: Secondary | ICD-10-CM | POA: Diagnosis not present

## 2021-07-28 ENCOUNTER — Other Ambulatory Visit: Payer: Self-pay

## 2021-07-28 ENCOUNTER — Inpatient Hospital Stay: Payer: Medicare Other | Attending: Hematology | Admitting: Hematology

## 2021-07-28 ENCOUNTER — Inpatient Hospital Stay: Payer: Medicare Other

## 2021-07-28 VITALS — BP 126/75 | HR 72 | Temp 97.3°F | Resp 18 | Wt 259.4 lb

## 2021-07-28 DIAGNOSIS — D3A090 Benign carcinoid tumor of the bronchus and lung: Secondary | ICD-10-CM

## 2021-07-28 DIAGNOSIS — C7A090 Malignant carcinoid tumor of the bronchus and lung: Secondary | ICD-10-CM | POA: Diagnosis not present

## 2021-07-28 DIAGNOSIS — E1122 Type 2 diabetes mellitus with diabetic chronic kidney disease: Secondary | ICD-10-CM | POA: Diagnosis not present

## 2021-07-28 DIAGNOSIS — D649 Anemia, unspecified: Secondary | ICD-10-CM | POA: Diagnosis not present

## 2021-07-28 DIAGNOSIS — N183 Chronic kidney disease, stage 3 unspecified: Secondary | ICD-10-CM | POA: Insufficient documentation

## 2021-07-28 DIAGNOSIS — I129 Hypertensive chronic kidney disease with stage 1 through stage 4 chronic kidney disease, or unspecified chronic kidney disease: Secondary | ICD-10-CM | POA: Insufficient documentation

## 2021-07-28 LAB — CBC WITH DIFFERENTIAL (CANCER CENTER ONLY)
Abs Immature Granulocytes: 0.45 10*3/uL — ABNORMAL HIGH (ref 0.00–0.07)
Basophils Absolute: 0.1 10*3/uL (ref 0.0–0.1)
Basophils Relative: 1 %
Eosinophils Absolute: 0.4 10*3/uL (ref 0.0–0.5)
Eosinophils Relative: 4 %
HCT: 33.8 % — ABNORMAL LOW (ref 36.0–46.0)
Hemoglobin: 11.1 g/dL — ABNORMAL LOW (ref 12.0–15.0)
Immature Granulocytes: 4 %
Lymphocytes Relative: 19 %
Lymphs Abs: 2.3 10*3/uL (ref 0.7–4.0)
MCH: 31.2 pg (ref 26.0–34.0)
MCHC: 32.8 g/dL (ref 30.0–36.0)
MCV: 94.9 fL (ref 80.0–100.0)
Monocytes Absolute: 0.8 10*3/uL (ref 0.1–1.0)
Monocytes Relative: 7 %
Neutro Abs: 8.1 10*3/uL — ABNORMAL HIGH (ref 1.7–7.7)
Neutrophils Relative %: 65 %
Platelet Count: 352 10*3/uL (ref 150–400)
RBC: 3.56 MIL/uL — ABNORMAL LOW (ref 3.87–5.11)
RDW: 14.6 % (ref 11.5–15.5)
WBC Count: 12.3 10*3/uL — ABNORMAL HIGH (ref 4.0–10.5)
nRBC: 0 % (ref 0.0–0.2)

## 2021-07-28 LAB — CMP (CANCER CENTER ONLY)
ALT: 27 U/L (ref 0–44)
AST: 13 U/L — ABNORMAL LOW (ref 15–41)
Albumin: 3.5 g/dL (ref 3.5–5.0)
Alkaline Phosphatase: 79 U/L (ref 38–126)
Anion gap: 8 (ref 5–15)
BUN: 50 mg/dL — ABNORMAL HIGH (ref 8–23)
CO2: 34 mmol/L — ABNORMAL HIGH (ref 22–32)
Calcium: 9.1 mg/dL (ref 8.9–10.3)
Chloride: 98 mmol/L (ref 98–111)
Creatinine: 1.56 mg/dL — ABNORMAL HIGH (ref 0.44–1.00)
GFR, Estimated: 37 mL/min — ABNORMAL LOW (ref >60)
Glucose, Bld: 206 mg/dL — ABNORMAL HIGH (ref 70–99)
Potassium: 4.3 mmol/L (ref 3.5–5.1)
Sodium: 140 mmol/L (ref 135–145)
Total Bilirubin: 0.4 mg/dL (ref 0.3–1.2)
Total Protein: 7.1 g/dL (ref 6.5–8.1)

## 2021-07-28 NOTE — Progress Notes (Signed)
? ? ?HEMATOLOGY/ONCOLOGY CLINIC NOTE ? ?Date of Service: .07/28/2021 ? ? ?Patient Care Team: ?Carrolyn Meiers, MD as PCP - General (Internal Medicine) ?Satira Sark, MD as PCP - Cardiology (Cardiology) ?Fields, Marga Melnick, MD (Inactive) (Gastroenterology) ?Rourk, Cristopher Estimable, MD as Consulting Physician (Gastroenterology)  ?[place] [phone] ?Legal Guardian [employer] ? ?CHIEF COMPLAINTS/PURPOSE OF CONSULTATION:  ?Follow-up for bronchopulmonary carcinoid tumor ? ?HISTORY OF PRESENTING ILLNESS:  ?Mrs. Rehm is a 66 y.o. F with mental disability in LTC, asthma well-controlled, CKD III Baseline Cr 1.3-1.6, HTN, and DM who presented with severe dyspnea requiring BiPAP by EMS.  On arrival in the ER was in V. tach, cardiology consulted and given amiodarone bolus, then developed unstable wide-complex tachycardia and acute respiratory failure.  Creatinine 4.3, potassium 7.5 on admission, pH 7.15, PCO2 45, lactic acid 3.2, chest x-ray pneumonia.  Temporizing potassium measures given, broad-spectrum antibiotics started, intubated and transferred to ICU at [facility]. Extubated 2/28 after 6 days. ?  ?The patient was recently admitted to the hospital earlier in February 2020 for community-acquired pneumonia.  During her hospitalization, the patient had a CT of the chest performed on 05/13/2018 which showed a masslike consolidation measuring 5 x 8.2 cm over the left lower lobe.  It was recommended that she have a follow-up CT scan performed within the next month as an outpatient.  During this current hospitalization, it was recommended that she undergo TEE and bronchoscopy.  This was performed on 05/26/2018.  Biopsy results showed typical carcinoid tumor and atypical squamous metaplasia, favor reactive.   ?  ?When seen today, the patient had just finished dialysis.  She reports having fatigue and a cough.  She states that she has occasional blood-tinged sputum when she coughs.  She denies fevers and chills.   Denies chest pain and shortness of breath.  Denies abdominal discomfort, nausea, vomiting, constipation, diarrhea.  She reports intermittent headaches. Oncology was consulted for recommendations regarding her carcinoid tumor. ?  ?Of note, the patient reside in an assisted living facility in [place], [place].  She tells me that she has a son who visits her regularly but he is not her power of attorney.  She has a guardian [name]) who oversees her care.  Telephone numbers are on the chart. ? ?INTERVAL HISTORY:  ? ?FOLASADE MOOTY returns for continued evaluation and management of her bronchopulmonary carcinoid tumor. ?She notes that she had a cough due to her recent cold which was treated with antibiotics with improvement in her cough.  No new shortness of breath or chest pain.  No palpitations. ?Since her last visit she has been evaluated by pulmonary Dr. Valeta Harms and by cardiothoracic surgery Dr. Kipp Brood. ?She has been deemed not to be a good candidate for endobronchial cryotherapy or surgery given her extent of comorbidities. ?Note some mild diarrhea that is improved at this time and not persistent.  She notes it varies based on what she eats. ?She continues to follow-up with multiple physicians including cardiology for her A-fib and cardiac issues as well as with nephrology for her chronic kidney disease. ?Labs done today were reviewed with her in detail along with her accompanying caregiver from [place]. ? ?MEDICAL HISTORY:  ?Past Medical History:  ?Diagnosis Date  ? Arthritis   ? Asthma   ? Atrial fibrillation ([place])   ? 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 Left lung  ? Chronic diastolic CHF (congestive heart failure) ([place])   ? CKD (chronic kidney  disease), stage III ([place])   ? Diabetes mellitus type 2 in obese [facility])   ? Essential hypertension   ? Iron deficiency anemia 10/16/2010  ? Mental handicap 10/16/2010  ? Mild CAD 2013  ? Morbid obesity ([place])   ? OSA (obstructive sleep  apnea)   ? ? ?SURGICAL HISTORY: ?Past Surgical History:  ?Procedure Laterality Date  ? ABDOMINAL HYSTERECTOMY    ? AV FISTULA PLACEMENT Right 06/06/2018  ? Procedure: ARTERIOVENOUS (AV) FISTULA CREATION;  Surgeon: Marty Heck, MD;  Location: [place];  Service: Vascular;  Laterality: Right;  ? BRONCHIAL DILITATION  12/13/2020  ? Procedure: BRONCHIAL DILITATION;  Surgeon: Garner Nash, DO;  Location: [facility];  Service: Pulmonary;;  ? CESAREAN SECTION    ? CHOLECYSTECTOMY    ? COLONOSCOPY  08/2010  ? normal TI, sigmoid polyp (adenoma ). Next TCS due  08/2015,  ? COLONOSCOPY WITH PROPOFOL N/A 07/08/2020  ? Procedure: COLONOSCOPY WITH PROPOFOL;  Surgeon: Daneil Dolin, MD;  [facility];  Service: Endoscopy;  Laterality: N/A;  AM (diabetic and facility patient)  ? ESOPHAGOGASTRODUODENOSCOPY  08/2010  ? antral and duodenal erosions s/p bx (chronic gastritis, no h.pylori, no celiac dz ), hiatal hernia  ? FISTULA SUPERFICIALIZATION Right 08/25/2018  ? Procedure: FISTULA SUPERFICIALIZATION RIGHT ARM;  Surgeon: Waynetta Sandy, MD;  Location: [place];  Service: Cardiovascular;  Laterality: Right;  ? IR FLUORO GUIDE CV LINE RIGHT  05/31/2018  ? IR US GUIDE VASC ACCESS RIGHT  05/31/2018  ? KNEE SURGERY    ? right knee @ 66 years of age  ? LEFT AND RIGHT HEART CATHETERIZATION WITH CORONARY ANGIOGRAM N/A 09/21/2011  ? Procedure: LEFT AND RIGHT HEART CATHETERIZATION WITH CORONARY ANGIOGRAM;  Surgeon: Birdie Riddle, MD;  Location: [place] [facility];  Service: Cardiovascular;  Laterality: N/A;  ? POLYPECTOMY  07/08/2020  ? Procedure: POLYPECTOMY INTESTINAL;  Surgeon: Daneil Dolin, MD;  [facility];  Service: Endoscopy;;  ? VIDEO BRONCHOSCOPY Left 12/13/2020  ? Procedure: VIDEO BRONCHOSCOPY WITHOUT FLUORO;  Surgeon: Garner Nash, DO;  Location: [place];  Service: Pulmonary;  Laterality: Left;  Cryotherapy  ? ? ?SOCIAL HISTORY: ?Social History  ? ?Socioeconomic History  ? Marital status:  Divorced  ?  Spouse name: Not on file  ? Number of children: 1  ? Years of education: Not on file  ? Highest education level: Not on file  ?Occupational History  ?  Employer: UNEMPLOYED  ?Tobacco Use  ? Smoking status: Former  ?  Packs/day: 1.00  ?  Years: 0.00  ?  Pack years: 0.00  ?  Types: Cigarettes  ?  Quit date: 04/11/1996  ?  Years since quitting: 25.3  ? Smokeless tobacco: Never  ? Tobacco comments:  ?  quit a couple year ago  ?Vaping Use  ? Vaping Use: Never used  ?Substance and Sexual Activity  ? Alcohol use: No  ? Drug use: No  ? Sexual activity: Never  ?Other Topics Concern  ? Not on file  ?Social History Narrative  ? Lives with parents.   ? ?Social Determinants of Health  ? ?Financial Resource Strain: Not on file  ?Food Insecurity: Not on file  ?Transportation Needs: Not on file  ?Physical Activity: Not on file  ?Stress: Not on file  ?Social Connections: Not on file  ?Intimate Partner Violence: Not on file  ? ? ?FAMILY HISTORY: ?Family History  ?Problem Relation Age of Onset  ? Diabetes Mother   ?  Hypertension Mother   ? Heart failure Mother   ? Hypertension Father   ? Atrial fibrillation Father   ? Kidney disease Other   ?     son reportedly has had cyst on kidney and lung s/p surgery at [facility]  ? Hypertension Son   ? Colon cancer Neg Hx   ? ? ?ALLERGIES:  has No Known Allergies. ? ?MEDICATIONS:  ?Current Outpatient Medications  ?Medication Sig Dispense Refill  ? acetaminophen (TYLENOL) 325 MG tablet Take 325 mg by mouth every 12 (twelve) hours as needed for moderate pain.    ? albuterol (PROVENTIL HFA;VENTOLIN HFA) 108 (90 Base) MCG/ACT inhaler Inhale 2 puffs into the lungs every 6 (six) hours as needed for shortness of breath.    ? apixaban (ELIQUIS) 5 MG TABS tablet Take 1 tablet (5 mg total) by mouth 2 (two) times daily. 60 tablet 0  ? benzonatate (TESSALON) 100 MG capsule Take 1 capsule (100 mg total) by mouth 2 (two) times daily as needed for cough. 60 capsule 0  ? betamethasone dipropionate  (DIPROLENE) 0.05 % ointment Apply 1 application topically daily as needed (for rash under breasts).     ? calcitRIOL (ROCALTROL) 0.25 MCG capsule Take 0.25 mcg by mouth every Monday, Wednesday, and Friday.    ? cetirizi

## 2021-07-31 ENCOUNTER — Encounter: Payer: Self-pay | Admitting: "Endocrinology

## 2021-07-31 ENCOUNTER — Ambulatory Visit (INDEPENDENT_AMBULATORY_CARE_PROVIDER_SITE_OTHER): Payer: Medicare Other | Admitting: "Endocrinology

## 2021-07-31 VITALS — BP 106/56 | HR 72 | Ht 65.0 in | Wt 255.4 lb

## 2021-07-31 DIAGNOSIS — E782 Mixed hyperlipidemia: Secondary | ICD-10-CM | POA: Diagnosis not present

## 2021-07-31 DIAGNOSIS — I10 Essential (primary) hypertension: Secondary | ICD-10-CM

## 2021-07-31 DIAGNOSIS — E1122 Type 2 diabetes mellitus with diabetic chronic kidney disease: Secondary | ICD-10-CM

## 2021-07-31 DIAGNOSIS — Z794 Long term (current) use of insulin: Secondary | ICD-10-CM

## 2021-07-31 DIAGNOSIS — N184 Chronic kidney disease, stage 4 (severe): Secondary | ICD-10-CM | POA: Diagnosis not present

## 2021-07-31 LAB — POCT GLYCOSYLATED HEMOGLOBIN (HGB A1C): HbA1c, POC (controlled diabetic range): 7 % (ref 0.0–7.0)

## 2021-07-31 LAB — CHROMOGRANIN A: Chromogranin A (ng/mL): 219.1 ng/mL — ABNORMAL HIGH (ref 0.0–101.8)

## 2021-07-31 NOTE — Progress Notes (Signed)
?07/31/2021 ? ?Endocrinology follow-up note ? ? ?Subjective:  ? ? Patient ID: [name], female    DOB: Apr 29, 1955, PCP Carrolyn Meiers, MD ? ? ?Past Medical History:  ?Diagnosis Date  ? Arthritis   ? Asthma   ? Atrial fibrillation ([place])   ? 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 Left lung  ? Chronic diastolic CHF (congestive heart failure) ([place])   ? CKD (chronic kidney disease), stage III ([place])   ? Diabetes mellitus type 2 in obese [facility])   ? Essential hypertension   ? Iron deficiency anemia 10/16/2010  ? Mental handicap 10/16/2010  ? Mild CAD 2013  ? Morbid obesity ([place])   ? OSA (obstructive sleep apnea)   ? ?Past Surgical History:  ?Procedure Laterality Date  ? ABDOMINAL HYSTERECTOMY    ? AV FISTULA PLACEMENT Right 06/06/2018  ? Procedure: ARTERIOVENOUS (AV) FISTULA CREATION;  Surgeon: Marty Heck, MD;  Location: [place];  Service: Vascular;  Laterality: Right;  ? BRONCHIAL DILITATION  12/13/2020  ? Procedure: BRONCHIAL DILITATION;  Surgeon: Garner Nash, DO;  Location: [place] [facility];  Service: Pulmonary;;  ? CESAREAN SECTION    ? CHOLECYSTECTOMY    ? COLONOSCOPY  08/2010  ? normal TI, sigmoid polyp (adenoma ). Next TCS due  08/2015,  ? COLONOSCOPY WITH PROPOFOL N/A 07/08/2020  ? Procedure: COLONOSCOPY WITH PROPOFOL;  Surgeon: Daneil Dolin, MD;  [facility];  Service: Endoscopy;  Laterality: N/A;  AM (diabetic and facility patient)  ? ESOPHAGOGASTRODUODENOSCOPY  08/2010  ? antral and duodenal erosions s/p bx (chronic gastritis, no h.pylori, no celiac dz ), hiatal hernia  ? FISTULA SUPERFICIALIZATION Right 08/25/2018  ? Procedure: FISTULA SUPERFICIALIZATION RIGHT ARM;  Surgeon: Waynetta Sandy, MD;  Location: [place];  Service: Cardiovascular;  Laterality: Right;  ? IR FLUORO GUIDE CV LINE RIGHT  05/31/2018  ? IR US GUIDE VASC ACCESS RIGHT  05/31/2018  ? KNEE SURGERY    ? right knee @ 66 years of age  ? LEFT AND RIGHT HEART CATHETERIZATION WITH CORONARY ANGIOGRAM N/A  09/21/2011  ? Procedure: LEFT AND RIGHT HEART CATHETERIZATION WITH CORONARY ANGIOGRAM;  Surgeon: Birdie Riddle, MD;  Location: [place] [facility];  Service: Cardiovascular;  Laterality: N/A;  ? POLYPECTOMY  07/08/2020  ? Procedure: POLYPECTOMY INTESTINAL;  Surgeon: Daneil Dolin, MD;  [facility];  Service: Endoscopy;;  ? VIDEO BRONCHOSCOPY Left 12/13/2020  ? Procedure: VIDEO BRONCHOSCOPY WITHOUT FLUORO;  Surgeon: Garner Nash, DO;  Location: [place];  Service: Pulmonary;  Laterality: Left;  Cryotherapy  ? ?Social History  ? ?Socioeconomic History  ? Marital status: Divorced  ?  Spouse name: Not on file  ? Number of children: 1  ? Years of education: Not on file  ? Highest education level: Not on file  ?Occupational History  ?  Employer: UNEMPLOYED  ?Tobacco Use  ? Smoking status: Former  ?  Packs/day: 1.00  ?  Years: 0.00  ?  Pack years: 0.00  ?  Types: Cigarettes  ?  Quit date: 04/11/1996  ?  Years since quitting: 25.3  ? Smokeless tobacco: Never  ? Tobacco comments:  ?  quit a couple year ago  ?Vaping Use  ? Vaping Use: Never used  ?Substance and Sexual Activity  ? Alcohol use: No  ? Drug use: No  ? Sexual activity: Never  ?Other Topics Concern  ? Not on file  ?Social History Narrative  ? Lives with  parents.   ? ?Social Determinants of Health  ? ?Financial Resource Strain: Not on file  ?Food Insecurity: Not on file  ?Transportation Needs: Not on file  ?Physical Activity: Not on file  ?Stress: Not on file  ?Social Connections: Not on file  ? ?Outpatient Encounter Medications as of 07/31/2021  ?Medication Sig  ? acetaminophen (TYLENOL) 325 MG tablet Take 325 mg by mouth every 12 (twelve) hours as needed for moderate pain.  ? albuterol (PROVENTIL HFA;VENTOLIN HFA) 108 (90 Base) MCG/ACT inhaler Inhale 2 puffs into the lungs every 6 (six) hours as needed for shortness of breath.  ? apixaban (ELIQUIS) 5 MG TABS tablet Take 1 tablet (5 mg total) by mouth 2 (two) times daily.  ? benzonatate (TESSALON) 100 MG  capsule Take 1 capsule (100 mg total) by mouth 2 (two) times daily as needed for cough.  ? betamethasone dipropionate (DIPROLENE) 0.05 % ointment Apply 1 application topically daily as needed (for rash under breasts).   ? calcitRIOL (ROCALTROL) 0.25 MCG capsule Take 0.25 mcg by mouth every Monday, Wednesday, and Friday.  ? cetirizine (ZYRTEC) 10 MG tablet Take 10 mg by mouth daily. (0800)  ? Cholecalciferol (VITAMIN D3) 50 MCG (2000 UT) TABS TAKE 1 TABLET BY MOUTH ONCE DAILY.  ? clotrimazole (LOTRIMIN) 1 % cream Apply 1 application topically daily as needed (under breasts as needed for rash).  ? DIABETIC TUSSIN EX 100 MG/5ML syrup Take 100 mg by mouth every 4 (four) hours as needed for cough or congestion.   ? diltiazem (CARDIZEM CD) 240 MG 24 hr capsule Take 1 capsule (240 mg total) by mouth daily. (Patient taking differently: Take 240 mg by mouth daily. (0800))  ? DROPSAFE SAFETY PEN NEEDLES 31G X 6 MM MISC   ? EASYMAX TEST test strip CHECK BLOOD SUGAR 4 TIMES A DAY. (BREAKFAST, LUNCH, SUPPER & BEDTIME)  ? fluticasone (FLONASE) 50 MCG/ACT nasal spray Place 2 sprays into both nostrils daily as needed for allergies or rhinitis.  ? Fluticasone-Umeclidin-Vilant 100-62.5-25 MCG/INH AEPB Inhale 1 puff into the lungs daily. (0800)  ? furosemide (LASIX) 40 MG tablet Take 40 mg by mouth See admin instructions. Take 40 mg twice daily, alternating with 40 mg daily every other day  ? guaiFENesin (MUCINEX) 600 MG 12 hr tablet Take 1 tablet (600 mg total) by mouth 2 (two) times daily as needed. (Patient taking differently: Take 600 mg by mouth 2 (two) times daily as needed (cough/congestion).)  ? HUMULIN R U-500 KWIKPEN 500 UNIT/ML KwikPen INJECT 40 UNITS SUBCUTANEOUSLY AT BREAKFAST; INJECT 30 UNITS AT LUNCH & SUPPER; WHEN GLUCOSE IS ABOVE 90 & EATING.(HOLD IF BS BELOW 70: CALL MD IF BS ABOVE 400  ? ipratropium-albuterol (DUONEB) 0.5-2.5 (3) MG/3ML SOLN Take 3 mLs by nebulization every 6 (six) hours as needed (for cough).   ?  isosorbide dinitrate (ISORDIL) 10 MG tablet Take 1 tablet (10 mg total) by mouth 3 (three) times daily. (Patient taking differently: Take 10 mg by mouth 3 (three) times daily. (0800, 1400 & 2000))  ? Lancets 28G MISC CHECK BLOOD SUGAR 4 TIMES A DAY. (BREAKFAST, LUNCH, SUPPER, & BEDTIME)  ? losartan (COZAAR) 25 MG tablet Take 25 mg by mouth daily.  ? metolazone (ZAROXOLYN) 5 MG tablet Take 5 mg by mouth in the morning. (0800)  ? metoprolol tartrate (LOPRESSOR) 50 MG tablet Take 50 mg by mouth 2 (two) times daily. (0800 & 2000)  ? montelukast (SINGULAIR) 10 MG tablet Take 10 mg by mouth at bedtime. (2000)  ?  polyethylene glycol powder (GLYCOLAX/MIRALAX) 17 GM/SCOOP powder Take 17 g by mouth daily as needed for mild constipation or moderate constipation.  ? potassium chloride SA (KLOR-CON) 20 MEQ tablet Take 20 mEq by mouth 3 (three) times daily. (0800, 1400 & 2000)  ? rosuvastatin (CRESTOR) 20 MG tablet TAKE (1) TABLET BY MOUTH ONCE DAILY. (Patient taking differently: Take 20 mg by mouth daily at 8 pm. (2000))  ? triamcinolone cream (KENALOG) 0.5 % Apply 1 application topically every 12 (twelve) hours as needed (applied to bilateral legs for allergic dermatitis).  ? Vitamin D, Cholecalciferol, 25 MCG (1000 UT) TABS TAKE 1 TABLET BY MOUTH ONCE DAILY.  ? ?No facility-administered encounter medications on file as of 07/31/2021.  ? ?ALLERGIES: ?No Known Allergies ?VACCINATION STATUS: ?Immunization History  ?Administered Date(s) Administered  ? Hepatitis B, adult 07/29/2018, 08/29/2018, 10/07/2018  ? Influenza Inj Mdck Quad Pf 12/19/2016  ? Influenza,inj,Quad PF,6+ Mos 02/02/2013, 05/15/2018  ? Influenza-Unspecified 05/15/2018  ? PPD Test 07/22/2018  ? Pneumococcal Polysaccharide-23 05/15/2018  ? Pneumococcal-Unspecified 05/15/2018  ? ? ?Diabetes ?She presents for her follow-up diabetic visit. She has type 2 diabetes mellitus. Onset time: she was diagnosed at approximate age of 55 years. Her disease course has been  worsening. There are no hypoglycemic associated symptoms. Pertinent negatives for hypoglycemia include no confusion, headaches, pallor or seizures. Pertinent negatives for diabetes include no blurred vision, no

## 2021-07-31 NOTE — Patient Instructions (Signed)

## 2021-08-03 NOTE — Progress Notes (Signed)
? ? ?Cardiology Office Note ? ?Date: 08/04/2021  ? ?ID: [name], DOB [birth year], MRN [id number] ? ?PCP:  Carrolyn Meiers, MD  ?Cardiologist:  Rozann Lesches, MD ?Electrophysiologist:  None  ? ?Chief Complaint  ?Patient presents with  ? Cardiac follow-up  ? ? ?History of Present Illness: ?[name] is a 66 y.o. female last seen in November 2022.  She is here today with assistant from [facility].  She reports an occasional sense of palpitations, nothing prolonged or associated with shortness of breath or chest pain. ? ?I reviewed her medications.  No obvious bleeding problems on Eliquis.  She did have recent lab work which I also reviewed.  Heart rate control is achieved with a combination of Cardizem CD and Lopressor. ? ?Weight is also stable, no increasing edema, no orthopnea or PND. ? ?Past Medical History:  ?Diagnosis Date  ? Arthritis   ? Asthma   ? Atrial fibrillation ([place])   ? 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 Left lung  ? Chronic diastolic CHF (congestive heart failure) ([place])   ? CKD (chronic kidney disease), stage III ([place])   ? Diabetes mellitus type 2 in obese [facility])   ? Essential hypertension   ? Iron deficiency anemia 10/16/2010  ? Mental handicap 10/16/2010  ? Mild CAD 2013  ? Morbid obesity ([place])   ? OSA (obstructive sleep apnea)   ? ? ?Past Surgical History:  ?Procedure Laterality Date  ? ABDOMINAL HYSTERECTOMY    ? AV FISTULA PLACEMENT Right 06/06/2018  ? Procedure: ARTERIOVENOUS (AV) FISTULA CREATION;  Surgeon: Marty Heck, MD;  Location: [place];  Service: Vascular;  Laterality: Right;  ? BRONCHIAL DILITATION  12/13/2020  ? Procedure: BRONCHIAL DILITATION;  Surgeon: Garner Nash, DO;  Location: [place] [facility];  Service: Pulmonary;;  ? CESAREAN SECTION    ? CHOLECYSTECTOMY    ? COLONOSCOPY  08/2010  ? normal TI, sigmoid polyp (adenoma ). Next TCS due  08/2015,  ? COLONOSCOPY WITH PROPOFOL N/A 07/08/2020  ? Procedure: COLONOSCOPY WITH PROPOFOL;  Surgeon:  Daneil Dolin, MD;  [facility];  Service: Endoscopy;  Laterality: N/A;  AM (diabetic and facility patient)  ? ESOPHAGOGASTRODUODENOSCOPY  08/2010  ? antral and duodenal erosions s/p bx (chronic gastritis, no h.pylori, no celiac dz ), hiatal hernia  ? FISTULA SUPERFICIALIZATION Right 08/25/2018  ? Procedure: FISTULA SUPERFICIALIZATION RIGHT ARM;  Surgeon: Waynetta Sandy, MD;  Location: [place];  Service: Cardiovascular;  Laterality: Right;  ? IR FLUORO GUIDE CV LINE RIGHT  05/31/2018  ? IR US GUIDE VASC ACCESS RIGHT  05/31/2018  ? KNEE SURGERY    ? right knee @ 66 years of age  ? LEFT AND RIGHT HEART CATHETERIZATION WITH CORONARY ANGIOGRAM N/A 09/21/2011  ? Procedure: LEFT AND RIGHT HEART CATHETERIZATION WITH CORONARY ANGIOGRAM;  Surgeon: Birdie Riddle, MD;  Location: [place] [facility];  Service: Cardiovascular;  Laterality: N/A;  ? POLYPECTOMY  07/08/2020  ? Procedure: POLYPECTOMY INTESTINAL;  Surgeon: Daneil Dolin, MD;  [facility];  Service: Endoscopy;;  ? VIDEO BRONCHOSCOPY Left 12/13/2020  ? Procedure: VIDEO BRONCHOSCOPY WITHOUT FLUORO;  Surgeon: Garner Nash, DO;  Location: [place];  Service: Pulmonary;  Laterality: Left;  Cryotherapy  ? ? ?Current Outpatient Medications  ?Medication Sig Dispense Refill  ? acetaminophen (TYLENOL) 325 MG tablet Take 325 mg by mouth every 12 (twelve) hours as needed for moderate pain.    ? albuterol (PROVENTIL HFA;VENTOLIN HFA)  108 (90 Base) MCG/ACT inhaler Inhale 2 puffs into the lungs every 6 (six) hours as needed for shortness of breath.    ? apixaban (ELIQUIS) 5 MG TABS tablet Take 1 tablet (5 mg total) by mouth 2 (two) times daily. 60 tablet 0  ? benzonatate (TESSALON) 100 MG capsule Take 1 capsule (100 mg total) by mouth 2 (two) times daily as needed for cough. 60 capsule 0  ? betamethasone dipropionate (DIPROLENE) 0.05 % ointment Apply 1 application topically daily as needed (for rash under breasts).     ? calcitRIOL (ROCALTROL) 0.25 MCG  capsule Take 0.25 mcg by mouth every Monday, Wednesday, and Friday.    ? cetirizine (ZYRTEC) 10 MG tablet Take 10 mg by mouth daily. (0800)    ? Cholecalciferol (VITAMIN D3) 50 MCG (2000 UT) TABS TAKE 1 TABLET BY MOUTH ONCE DAILY. 30 tablet 2  ? clotrimazole (LOTRIMIN) 1 % cream Apply 1 application topically daily as needed (under breasts as needed for rash).    ? DIABETIC TUSSIN EX 100 MG/5ML syrup Take 100 mg by mouth every 4 (four) hours as needed for cough or congestion.     ? diltiazem (CARDIZEM CD) 240 MG 24 hr capsule Take 1 capsule (240 mg total) by mouth daily. (Patient taking differently: Take 240 mg by mouth daily. (0800)) 30 capsule 0  ? DROPSAFE SAFETY PEN NEEDLES 31G X 6 MM MISC     ? EASYMAX TEST test strip CHECK BLOOD SUGAR 4 TIMES A DAY. (BREAKFAST, LUNCH, SUPPER & BEDTIME) 100 strip 5  ? fluticasone (FLONASE) 50 MCG/ACT nasal spray Place 2 sprays into both nostrils daily as needed for allergies or rhinitis.    ? Fluticasone-Umeclidin-Vilant 100-62.5-25 MCG/INH AEPB Inhale 1 puff into the lungs daily. (0800)    ? furosemide (LASIX) 40 MG tablet Take 40 mg by mouth See admin instructions. Take 40 mg twice daily, alternating with 40 mg daily every other day    ? guaiFENesin (MUCINEX) 600 MG 12 hr tablet Take 1 tablet (600 mg total) by mouth 2 (two) times daily as needed. (Patient taking differently: Take 600 mg by mouth 2 (two) times daily as needed (cough/congestion).) 60 tablet 11  ? HUMULIN R U-500 KWIKPEN 500 UNIT/ML KwikPen INJECT 40 UNITS SUBCUTANEOUSLY AT BREAKFAST; INJECT 30 UNITS AT LUNCH & SUPPER; WHEN GLUCOSE IS ABOVE 90 & EATING.(HOLD IF BS BELOW 70: CALL MD IF BS ABOVE 400 6 mL 0  ? ipratropium-albuterol (DUONEB) 0.5-2.5 (3) MG/3ML SOLN Take 3 mLs by nebulization every 6 (six) hours as needed (for cough).     ? isosorbide dinitrate (ISORDIL) 10 MG tablet Take 1 tablet (10 mg total) by mouth 3 (three) times daily. (Patient taking differently: Take 10 mg by mouth 3 (three) times daily.  (0800, 1400 & 2000)) 90 tablet 0  ? Lancets 28G MISC CHECK BLOOD SUGAR 4 TIMES A DAY. (BREAKFAST, LUNCH, SUPPER, & BEDTIME) 100 each 5  ? losartan (COZAAR) 25 MG tablet Take 25 mg by mouth daily.    ? metolazone (ZAROXOLYN) 5 MG tablet Take 5 mg by mouth in the morning. (0800)    ? metoprolol tartrate (LOPRESSOR) 50 MG tablet Take 50 mg by mouth 2 (two) times daily. (0800 & 2000)    ? montelukast (SINGULAIR) 10 MG tablet Take 10 mg by mouth at bedtime. (2000)    ? polyethylene glycol powder (GLYCOLAX/MIRALAX) 17 GM/SCOOP powder Take 17 g by mouth daily as needed for mild constipation or moderate constipation.    ?  potassium chloride SA (KLOR-CON) 20 MEQ tablet Take 20 mEq by mouth 3 (three) times daily. (0800, 1400 & 2000)    ? rosuvastatin (CRESTOR) 20 MG tablet TAKE (1) TABLET BY MOUTH ONCE DAILY. (Patient taking differently: Take 20 mg by mouth daily at 8 pm. (2000)) 30 tablet 3  ? triamcinolone cream (KENALOG) 0.5 % Apply 1 application topically every 12 (twelve) hours as needed (applied to bilateral legs for allergic dermatitis).    ? Vitamin D, Cholecalciferol, 25 MCG (1000 UT) TABS TAKE 1 TABLET BY MOUTH ONCE DAILY. 30 tablet 1  ? ?No current facility-administered medications for this visit.  ? ?Allergies:  Patient has no known allergies.  ? ?ROS: No syncope. ? ?Physical Exam: ?VS:  BP 124/66   Pulse 78   Ht 5\' 3"  (1.6 m)   Wt 257 lb 9.6 oz (116.8 kg)   SpO2 98%   BMI 45.63 kg/m? , BMI Body mass index is 45.63 kg/m?. ? ?Wt Readings from Last 3 Encounters:  ?08/04/21 257 lb 9.6 oz (116.8 kg)  ?07/31/21 255 lb 6.4 oz (115.8 kg)  ?07/28/21 259 lb 6.4 oz (117.7 kg)  ?  ?General: Patient appears comfortable at rest. ?HEENT: Conjunctiva and lids normal. ?Neck: Supple, no elevated JVP or carotid bruits, no thyromegaly. ?Lungs: Clear to auscultation, nonlabored breathing at rest. ?Cardiac: Regular rate and rhythm, no S3, 1/6 systolic murmur, no pericardial rub. ?Extremities: No pitting edema. ? ?ECG:  An ECG  dated 12/13/2020 was personally reviewed today and demonstrated:  Atrial fibrillation with prolonged QT interval. ? ?Recent Labwork: ?07/28/2021: ALT 27; AST 13; BUN 50; Creatinine 1.56; Hemoglobin 11.1; Platelet Count 352

## 2021-08-04 ENCOUNTER — Encounter: Payer: Self-pay | Admitting: Cardiology

## 2021-08-04 ENCOUNTER — Ambulatory Visit (INDEPENDENT_AMBULATORY_CARE_PROVIDER_SITE_OTHER): Payer: Medicare Other | Admitting: Cardiology

## 2021-08-04 VITALS — BP 124/66 | HR 78 | Ht 63.0 in | Wt 257.6 lb

## 2021-08-04 DIAGNOSIS — I5032 Chronic diastolic (congestive) heart failure: Secondary | ICD-10-CM | POA: Diagnosis not present

## 2021-08-04 DIAGNOSIS — N1832 Chronic kidney disease, stage 3b: Secondary | ICD-10-CM

## 2021-08-04 DIAGNOSIS — I480 Paroxysmal atrial fibrillation: Secondary | ICD-10-CM | POA: Diagnosis not present

## 2021-08-04 NOTE — Patient Instructions (Signed)
Medication Instructions:  Your physician recommends that you continue on your current medications as directed. Please refer to the Current Medication list given to you today.   Labwork: None today  Testing/Procedures: None today  Follow-Up: 6 months  Any Other Special Instructions Will Be Listed Below (If Applicable).  If you need a refill on your cardiac medications before your next appointment, please call your pharmacy.  

## 2021-08-11 DIAGNOSIS — I480 Paroxysmal atrial fibrillation: Secondary | ICD-10-CM | POA: Diagnosis not present

## 2021-08-11 DIAGNOSIS — E1165 Type 2 diabetes mellitus with hyperglycemia: Secondary | ICD-10-CM | POA: Diagnosis not present

## 2021-08-11 DIAGNOSIS — I5032 Chronic diastolic (congestive) heart failure: Secondary | ICD-10-CM | POA: Diagnosis not present

## 2021-08-12 DIAGNOSIS — M79675 Pain in left toe(s): Secondary | ICD-10-CM | POA: Diagnosis not present

## 2021-08-12 DIAGNOSIS — M79674 Pain in right toe(s): Secondary | ICD-10-CM | POA: Diagnosis not present

## 2021-08-12 DIAGNOSIS — B351 Tinea unguium: Secondary | ICD-10-CM | POA: Diagnosis not present

## 2021-08-18 NOTE — Progress Notes (Unsigned)
Referring Provider: Carrolyn Meiers* Primary Care Physician:  Carrolyn Meiers, MD Primary GI Physician: Dr. [name]  No chief complaint on file.   HPI:   [name] is a 66 y.o. female who resides at *** with multiple comorbidities including morbid obesity, obstructive sleep apnea, diabetes, chronic kidney disease stage III, A. fib on Eliquis, chronic diastolic CHF, CAD, HTN, left lower lung carcinoid tumor, not a candidate for treatment, presenting today at the request of Dr. Theador Hawthorne for IDA***  Last colonoscopy April 2022 with seven 5 to 9 mm polyps in the rectum (1) and in the ascending colon(6), resected and retrieved. Non-bleeding internal hemorrhoids.  Pathology with tubular adenomas.  Recommended 3-year surveillance.  Last EGD in 2012 for iron deficiency anemia with small hiatal hernia, couple of antral erosions, otherwise normal exam. Biopsies negative for celiac disease or H. pylori.  She was Hemoccult negative multiple times in 2012, and thus did not recommend small bowel capsule endoscopy.  Recommended ongoing care with hematology.  Her hemoglobin has been stable in the upper 9-11 range for quite some time. Most recent Hgb 11.1 on 07/28/21. She is received 2 doses of feraheme in January 2023. On 1/10, iron was 32 (L), iron saturation 9% (L), ferritin 10 (L).  In March, iron 62, iron saturation 22%, ferritin 212.    Today:    Past Medical History:  Diagnosis Date   Arthritis    Asthma    Atrial fibrillation ([place])    Carcinoid tumor determined by biopsy of lung 05/2018   Left lung   Chronic diastolic CHF (congestive heart failure) ([place])    CKD (chronic kidney disease), stage III ([place])    Diabetes mellitus type 2 in obese [facility])    Essential hypertension    Iron deficiency anemia 10/16/2010   Mental handicap 10/16/2010   Mild CAD 2013   Morbid obesity ([facility])    OSA (obstructive sleep apnea)     Past Surgical History:  Procedure Laterality Date    ABDOMINAL HYSTERECTOMY     AV FISTULA PLACEMENT Right 06/06/2018   Procedure: ARTERIOVENOUS (AV) FISTULA CREATION;  Surgeon: Marty Heck, MD;  Location: [place];  Service: Vascular;  Laterality: Right;   BRONCHIAL DILITATION  12/13/2020   Procedure: BRONCHIAL DILITATION;  Surgeon: Garner Nash, DO;  Location: [facility];  Service: Pulmonary;;   CESAREAN SECTION     CHOLECYSTECTOMY     COLONOSCOPY  08/2010   normal TI, sigmoid polyp (adenoma ). Next TCS due  08/2015,   COLONOSCOPY WITH PROPOFOL N/A 07/08/2020   Procedure: COLONOSCOPY WITH PROPOFOL;  Surgeon: Daneil Dolin, MD;  [facility];  Service: Endoscopy;  Laterality: N/A;  AM (diabetic and facility patient)   ESOPHAGOGASTRODUODENOSCOPY  08/2010   antral and duodenal erosions s/p bx (chronic gastritis, no h.pylori, no celiac dz ), hiatal hernia   FISTULA SUPERFICIALIZATION Right 08/25/2018   Procedure: FISTULA SUPERFICIALIZATION RIGHT ARM;  Surgeon: Waynetta Sandy, MD;  Location: [place];  Service: Cardiovascular;  Laterality: Right;   IR FLUORO GUIDE CV LINE RIGHT  05/31/2018   IR US GUIDE VASC ACCESS RIGHT  05/31/2018   KNEE SURGERY     right knee @ 66 years of age   LEFT AND RIGHT HEART CATHETERIZATION WITH CORONARY ANGIOGRAM N/A 09/21/2011   Procedure: LEFT AND RIGHT HEART CATHETERIZATION WITH CORONARY ANGIOGRAM;  Surgeon: Birdie Riddle, MD;  Location: [place] CATH LAB;  Service: Cardiovascular;  Laterality: N/A;   POLYPECTOMY  07/08/2020   Procedure: POLYPECTOMY INTESTINAL;  Surgeon: Daneil Dolin, MD;  [facility];  Service: Endoscopy;;   VIDEO BRONCHOSCOPY Left 12/13/2020   Procedure: VIDEO BRONCHOSCOPY WITHOUT FLUORO;  Surgeon: Garner Nash, DO;  Location: [place];  Service: Pulmonary;  Laterality: Left;  Cryotherapy    Current Outpatient Medications  Medication Sig Dispense Refill   acetaminophen (TYLENOL) 325 MG tablet Take 325 mg by mouth every 12 (twelve) hours as needed for  moderate pain.     albuterol (PROVENTIL HFA;VENTOLIN HFA) 108 (90 Base) MCG/ACT inhaler Inhale 2 puffs into the lungs every 6 (six) hours as needed for shortness of breath.     apixaban (ELIQUIS) 5 MG TABS tablet Take 1 tablet (5 mg total) by mouth 2 (two) times daily. 60 tablet 0   benzonatate (TESSALON) 100 MG capsule Take 1 capsule (100 mg total) by mouth 2 (two) times daily as needed for cough. 60 capsule 0   betamethasone dipropionate (DIPROLENE) 0.05 % ointment Apply 1 application topically daily as needed (for rash under breasts).      calcitRIOL (ROCALTROL) 0.25 MCG capsule Take 0.25 mcg by mouth every Monday, Wednesday, and Friday.     cetirizine (ZYRTEC) 10 MG tablet Take 10 mg by mouth daily. (0800)     Cholecalciferol (VITAMIN D3) 50 MCG (2000 UT) TABS TAKE 1 TABLET BY MOUTH ONCE DAILY. 30 tablet 2   clotrimazole (LOTRIMIN) 1 % cream Apply 1 application topically daily as needed (under breasts as needed for rash).     DIABETIC TUSSIN EX 100 MG/5ML syrup Take 100 mg by mouth every 4 (four) hours as needed for cough or congestion.      diltiazem (CARDIZEM CD) 240 MG 24 hr capsule Take 1 capsule (240 mg total) by mouth daily. (Patient taking differently: Take 240 mg by mouth daily. (0800)) 30 capsule 0   DROPSAFE SAFETY PEN NEEDLES 31G X 6 MM MISC      EASYMAX TEST test strip CHECK BLOOD SUGAR 4 TIMES A DAY. (BREAKFAST, LUNCH, SUPPER & BEDTIME) 100 strip 5   fluticasone (FLONASE) 50 MCG/ACT nasal spray Place 2 sprays into both nostrils daily as needed for allergies or rhinitis.     Fluticasone-Umeclidin-Vilant 100-62.5-25 MCG/INH AEPB Inhale 1 puff into the lungs daily. (0800)     furosemide (LASIX) 40 MG tablet Take 40 mg by mouth See admin instructions. Take 40 mg twice daily, alternating with 40 mg daily every other day     guaiFENesin (MUCINEX) 600 MG 12 hr tablet Take 1 tablet (600 mg total) by mouth 2 (two) times daily as needed. (Patient taking differently: Take 600 mg by mouth 2  (two) times daily as needed (cough/congestion).) 60 tablet 11   HUMULIN R U-500 KWIKPEN 500 UNIT/ML KwikPen INJECT 40 UNITS SUBCUTANEOUSLY AT BREAKFAST; INJECT 30 UNITS AT LUNCH & SUPPER; WHEN GLUCOSE IS ABOVE 90 & EATING.(HOLD IF BS BELOW 70: CALL MD IF BS ABOVE 400 6 mL 0   ipratropium-albuterol (DUONEB) 0.5-2.5 (3) MG/3ML SOLN Take 3 mLs by nebulization every 6 (six) hours as needed (for cough).      isosorbide dinitrate (ISORDIL) 10 MG tablet Take 1 tablet (10 mg total) by mouth 3 (three) times daily. (Patient taking differently: Take 10 mg by mouth 3 (three) times daily. (0800, 1400 & 2000)) 90 tablet 0   Lancets 28G MISC CHECK BLOOD SUGAR 4 TIMES A DAY. (BREAKFAST, LUNCH, SUPPER, & BEDTIME) 100 each 5   losartan (COZAAR) 25 MG tablet Take  25 mg by mouth daily.     metolazone (ZAROXOLYN) 5 MG tablet Take 5 mg by mouth in the morning. (0800)     metoprolol tartrate (LOPRESSOR) 50 MG tablet Take 50 mg by mouth 2 (two) times daily. (0800 & 2000)     montelukast (SINGULAIR) 10 MG tablet Take 10 mg by mouth at bedtime. (2000)     polyethylene glycol powder (GLYCOLAX/MIRALAX) 17 GM/SCOOP powder Take 17 g by mouth daily as needed for mild constipation or moderate constipation.     potassium chloride SA (KLOR-CON) 20 MEQ tablet Take 20 mEq by mouth 3 (three) times daily. (0800, 1400 & 2000)     rosuvastatin (CRESTOR) 20 MG tablet TAKE (1) TABLET BY MOUTH ONCE DAILY. (Patient taking differently: Take 20 mg by mouth daily at 8 pm. (2000)) 30 tablet 3   triamcinolone cream (KENALOG) 0.5 % Apply 1 application topically every 12 (twelve) hours as needed (applied to bilateral legs for allergic dermatitis).     Vitamin D, Cholecalciferol, 25 MCG (1000 UT) TABS TAKE 1 TABLET BY MOUTH ONCE DAILY. 30 tablet 1   No current facility-administered medications for this visit.    Allergies as of 08/20/2021   (No Known Allergies)    Family History  Problem Relation Age of Onset   Diabetes Mother    Hypertension  Mother    Heart failure Mother    Hypertension Father    Atrial fibrillation Father    Hypertension Son    Kidney disease Other        son reportedly has had cyst on kidney and lung s/p surgery at [facility]   Colon cancer Neg Hx     Social History   Socioeconomic History   Marital status: Divorced    Spouse name: Not on file   Number of children: 1   Years of education: Not on file   Highest education level: Not on file  Occupational History    Employer: UNEMPLOYED  Tobacco Use   Smoking status: Former    Packs/day: 1.00    Years: 0.00    Pack years: 0.00    Types: Cigarettes    Quit date: 04/11/1996    Years since quitting: 25.3   Smokeless tobacco: Never   Tobacco comments:    quit a couple year ago  Vaping Use   Vaping Use: Never used  Substance and Sexual Activity   Alcohol use: No   Drug use: No   Sexual activity: Never  Other Topics Concern   Not on file  Social History Narrative   Lives with parents.    Social Determinants of Health   Financial Resource Strain: Not on file  Food Insecurity: Not on file  Transportation Needs: Not on file  Physical Activity: Not on file  Stress: Not on file  Social Connections: Not on file    Review of Systems: Gen: Denies fever, chills, anorexia. Denies fatigue, weakness, weight loss.  CV: Denies chest pain, palpitations, syncope, peripheral edema, and claudication. Resp: Denies dyspnea at rest, cough, wheezing, coughing up blood, and pleurisy. GI: Denies vomiting blood, jaundice, and fecal incontinence.   Denies dysphagia or odynophagia. Derm: Denies rash, itching, dry skin Psych: Denies depression, anxiety, memory loss, confusion. No homicidal or suicidal ideation.  Heme: Denies bruising, bleeding, and enlarged lymph nodes.  Physical Exam: There were no vitals taken for this visit. General:   Alert and oriented. No distress noted. Pleasant and cooperative.  Head:  Normocephalic and atraumatic. Eyes:  Conjuctiva  clear without scleral icterus. Heart:  S1, S2 present without murmurs appreciated. Lungs:  Clear to auscultation bilaterally. No wheezes, rales, or rhonchi. No distress.  Abdomen:  +BS, soft, non-tender and non-distended. No rebound or guarding. No HSM or masses noted. Msk:  Symmetrical without gross deformities. Normal posture. Extremities:  Without edema. Neurologic:  Alert and  oriented x4 Psych:  Normal mood and affect.    Assessment:     Plan:  ***   Aliene Altes, PA-C [facility] 08/20/2021

## 2021-08-20 ENCOUNTER — Encounter: Payer: Self-pay | Admitting: Gastroenterology

## 2021-08-20 ENCOUNTER — Telehealth: Payer: Self-pay | Admitting: *Deleted

## 2021-08-20 ENCOUNTER — Ambulatory Visit (INDEPENDENT_AMBULATORY_CARE_PROVIDER_SITE_OTHER): Payer: Medicare Other | Admitting: Gastroenterology

## 2021-08-20 VITALS — BP 117/70 | HR 72 | Temp 97.6°F | Ht 62.0 in | Wt 260.0 lb

## 2021-08-20 DIAGNOSIS — D509 Iron deficiency anemia, unspecified: Secondary | ICD-10-CM

## 2021-08-20 NOTE — Telephone Encounter (Signed)
Attention: Preop   We would like to request holding the following medication for patient please.  Procedure:  Upper Endoscopy and Givens Capsule   Date: TBD  Medication to hold: Eliquis for 48 hours before procedure   Surgeon: Dr. [name]   Phone: [phone]  Fax:  [phone]  Type of Anesthesia: Propofol ASA III

## 2021-08-20 NOTE — Patient Instructions (Signed)
We will arrange for you to have an upper endoscopy with possible Givens capsule placement with Dr. [name] in near future. We are reaching out to your cardiologist to get the okay to hold Eliquis for 48 hours prior to your procedure. Morning of your procedure: Do not take any morning diabetes medications.  We will follow-up with you in the office after your procedure.  It was nice meeting you today!  Aliene Altes, PA-C [facility]

## 2021-08-20 NOTE — Telephone Encounter (Signed)
Sent medication clearance. Waiting for approval.

## 2021-08-21 ENCOUNTER — Telehealth: Payer: Self-pay | Admitting: *Deleted

## 2021-08-21 NOTE — Telephone Encounter (Signed)
   Primary Cardiologist: Rozann Lesches, MD  Chart reviewed as part of pre-operative protocol coverage. Given past medical history and time since last visit, based on ACC/AHA guidelines, MADAY GUARINO would be at acceptable risk for the planned procedure without further cardiovascular testing.   Patient with diagnosis of afib on Eliquis for anticoagulation.     Procedure: endoscopy Date of procedure: TBD   CHA2DS2-VASc Score = 6  This indicates a 9.7% annual risk of stroke. The patient's score is based upon: CHF History: 1 HTN History: 1 Diabetes History: 1 Stroke History: 0 Vascular Disease History: 1 Age Score: 1 Gender Score: 1   CrCl 92mL/min using adjusted body weight due to obesity Platelet count 352K   Per office protocol, patient can hold Eliquis for 2 days prior to procedure as requested.  I will route this recommendation to the requesting party via Epic fax function and remove from pre-op pool.  Please call with questions.  Jossie Ng. Giah Fickett NP-C    08/21/2021, 8:56 AM [address] Office [phone] Fax [phone]

## 2021-08-21 NOTE — Telephone Encounter (Signed)
Patient with diagnosis of afib on Eliquis for anticoagulation.    Procedure: endoscopy Date of procedure: TBD  CHA2DS2-VASc Score = 6  This indicates a 9.7% annual risk of stroke. The patient's score is based upon: CHF History: 1 HTN History: 1 Diabetes History: 1 Stroke History: 0 Vascular Disease History: 1 Age Score: 1 Gender Score: 1   CrCl 55mL/min using adjusted body weight due to obesity Platelet count 352K  Per office protocol, patient can hold Eliquis for 2 days prior to procedure as requested.

## 2021-08-21 NOTE — Telephone Encounter (Signed)
Reviewed.  Clearance provided to hold Eliquis for 2 days prior to procedure.  [facility] Clinical Pool:  Please proceed with scheduling upper endoscopy with possible Givens placement with propofol with Dr. [name] as planned.

## 2021-08-21 NOTE — Telephone Encounter (Signed)
Received medication approval for procedure. Copy placed on providers desk.

## 2021-08-22 ENCOUNTER — Encounter: Payer: Self-pay | Admitting: *Deleted

## 2021-08-22 NOTE — Telephone Encounter (Signed)
Called [facility]. Procedure scheduled for 7/20 at 11:45am. Will fax instructions and pre-op appt to [phone]

## 2021-08-28 DIAGNOSIS — D631 Anemia in chronic kidney disease: Secondary | ICD-10-CM | POA: Diagnosis not present

## 2021-08-28 DIAGNOSIS — E211 Secondary hyperparathyroidism, not elsewhere classified: Secondary | ICD-10-CM | POA: Diagnosis not present

## 2021-08-28 DIAGNOSIS — R809 Proteinuria, unspecified: Secondary | ICD-10-CM | POA: Diagnosis not present

## 2021-08-28 DIAGNOSIS — I5032 Chronic diastolic (congestive) heart failure: Secondary | ICD-10-CM | POA: Diagnosis not present

## 2021-08-28 DIAGNOSIS — E1129 Type 2 diabetes mellitus with other diabetic kidney complication: Secondary | ICD-10-CM | POA: Diagnosis not present

## 2021-08-28 DIAGNOSIS — N189 Chronic kidney disease, unspecified: Secondary | ICD-10-CM | POA: Diagnosis not present

## 2021-08-28 DIAGNOSIS — E1122 Type 2 diabetes mellitus with diabetic chronic kidney disease: Secondary | ICD-10-CM | POA: Diagnosis not present

## 2021-08-28 DIAGNOSIS — E876 Hypokalemia: Secondary | ICD-10-CM | POA: Diagnosis not present

## 2021-09-03 DIAGNOSIS — N189 Chronic kidney disease, unspecified: Secondary | ICD-10-CM | POA: Diagnosis not present

## 2021-09-03 DIAGNOSIS — E876 Hypokalemia: Secondary | ICD-10-CM | POA: Diagnosis not present

## 2021-09-03 DIAGNOSIS — E1129 Type 2 diabetes mellitus with other diabetic kidney complication: Secondary | ICD-10-CM | POA: Diagnosis not present

## 2021-09-03 DIAGNOSIS — I5032 Chronic diastolic (congestive) heart failure: Secondary | ICD-10-CM | POA: Diagnosis not present

## 2021-09-03 DIAGNOSIS — R809 Proteinuria, unspecified: Secondary | ICD-10-CM | POA: Diagnosis not present

## 2021-09-03 DIAGNOSIS — E211 Secondary hyperparathyroidism, not elsewhere classified: Secondary | ICD-10-CM | POA: Diagnosis not present

## 2021-09-03 DIAGNOSIS — D631 Anemia in chronic kidney disease: Secondary | ICD-10-CM | POA: Diagnosis not present

## 2021-09-03 DIAGNOSIS — E1122 Type 2 diabetes mellitus with diabetic chronic kidney disease: Secondary | ICD-10-CM | POA: Diagnosis not present

## 2021-09-10 DIAGNOSIS — E1129 Type 2 diabetes mellitus with other diabetic kidney complication: Secondary | ICD-10-CM | POA: Diagnosis not present

## 2021-09-10 DIAGNOSIS — I5032 Chronic diastolic (congestive) heart failure: Secondary | ICD-10-CM | POA: Diagnosis not present

## 2021-09-10 DIAGNOSIS — E1165 Type 2 diabetes mellitus with hyperglycemia: Secondary | ICD-10-CM | POA: Diagnosis not present

## 2021-09-10 DIAGNOSIS — N1831 Chronic kidney disease, stage 3a: Secondary | ICD-10-CM | POA: Diagnosis not present

## 2021-09-10 DIAGNOSIS — D631 Anemia in chronic kidney disease: Secondary | ICD-10-CM | POA: Diagnosis not present

## 2021-09-10 DIAGNOSIS — I10 Essential (primary) hypertension: Secondary | ICD-10-CM | POA: Diagnosis not present

## 2021-09-10 DIAGNOSIS — E876 Hypokalemia: Secondary | ICD-10-CM | POA: Diagnosis not present

## 2021-09-10 DIAGNOSIS — N189 Chronic kidney disease, unspecified: Secondary | ICD-10-CM | POA: Diagnosis not present

## 2021-09-10 DIAGNOSIS — E211 Secondary hyperparathyroidism, not elsewhere classified: Secondary | ICD-10-CM | POA: Diagnosis not present

## 2021-09-10 DIAGNOSIS — E1122 Type 2 diabetes mellitus with diabetic chronic kidney disease: Secondary | ICD-10-CM | POA: Diagnosis not present

## 2021-09-10 DIAGNOSIS — R809 Proteinuria, unspecified: Secondary | ICD-10-CM | POA: Diagnosis not present

## 2021-09-11 DIAGNOSIS — I5032 Chronic diastolic (congestive) heart failure: Secondary | ICD-10-CM | POA: Diagnosis not present

## 2021-09-11 DIAGNOSIS — E1122 Type 2 diabetes mellitus with diabetic chronic kidney disease: Secondary | ICD-10-CM | POA: Diagnosis not present

## 2021-09-19 ENCOUNTER — Other Ambulatory Visit: Payer: Self-pay | Admitting: "Endocrinology

## 2021-09-19 DIAGNOSIS — E1122 Type 2 diabetes mellitus with diabetic chronic kidney disease: Secondary | ICD-10-CM

## 2021-09-25 ENCOUNTER — Other Ambulatory Visit: Payer: Self-pay | Admitting: "Endocrinology

## 2021-10-06 ENCOUNTER — Telehealth: Payer: Self-pay | Admitting: "Endocrinology

## 2021-10-06 NOTE — Telephone Encounter (Signed)
[facility] called and would like to know why patent Lancets were denied

## 2021-10-07 MED ORDER — LANCETS 28G MISC: 5 refills | Status: DC

## 2021-10-07 NOTE — Telephone Encounter (Signed)
Lancets sent to [facility]. Lancets were not denied. The brand that they send over to refill comes back as an error and a generic prescription has to be sent in.

## 2021-10-08 ENCOUNTER — Telehealth: Payer: Self-pay | Admitting: *Deleted

## 2021-10-08 NOTE — Telephone Encounter (Signed)
Received call from facility needing the instructions signed by a provider since pt needs to start holding medications. Cyril Mourning signed and I faxed back to facility. Confirmation received

## 2021-10-11 DIAGNOSIS — N1831 Chronic kidney disease, stage 3a: Secondary | ICD-10-CM | POA: Diagnosis not present

## 2021-10-11 DIAGNOSIS — I10 Essential (primary) hypertension: Secondary | ICD-10-CM | POA: Diagnosis not present

## 2021-10-11 DIAGNOSIS — I5032 Chronic diastolic (congestive) heart failure: Secondary | ICD-10-CM | POA: Diagnosis not present

## 2021-10-13 ENCOUNTER — Encounter (HOSPITAL_COMMUNITY): Payer: Self-pay

## 2021-10-13 NOTE — Patient Instructions (Signed)
    [name]  10/13/2021     @PREFPERIOPPHARMACY @   Your procedure is scheduled on  10/16/2021.    Report to [facility] at  [facility]   Call this number if you have problems the morning of surgery:  [phone]   Remember:  Follow the enclosed instructions form the office about her diet.              The night before procedure, take 1/2 of usual night time insulin.            DO NOT take any medications for diabetes the morning of your procedure.            Use your inhaler before you come.                        Please make a copy of MAR am of the procedure with what medications patient took before arrival.      Take these medicines the morning of surgery with A SIP OF WATER                                             zyrtec, cardiazem, isordil, metoprolol.      Do not wear jewelry, make-up or nail polish.  Do not wear lotions, powders, or perfumes, or deodorant.  Do not shave 48 hours prior to surgery.  Men may shave face and neck.  Do not bring valuables to the hospital.  [facility] is not responsible for any belongings or valuables.  Contacts, dentures or bridgework may not be worn into surgery.  Leave your suitcase in the car.  After surgery it may be brought to your room.  For patients admitted to the hospital, discharge time will be determined by your treatment team.  Patients discharged the day of surgery will not be allowed to drive home and must have someone with them for 24 hours.    Special instructions:   DO NOT smoke tobacco or vape for 24 hours before your procedure.  Please read over the following fact sheets that you were given. Anesthesia Post-op Instructions and Care and Recovery After Surgery

## 2021-10-13 NOTE — Pre-Procedure Instructions (Signed)
Called [facility] to let them know patient did not need additional blood work. All information was done over the phone with [facility] @ [facility]. She states patient uses WC most of time but can pivot from chair to stretcher. Stacie states that Doretha Imus with [employer] is patient's legal guardian and should be with her that morning to sign consent, Katie's number is [phone]. Legal Guardian information is in [place]. I went over instructions verbally and also faxed a copy to [facility].

## 2021-10-14 ENCOUNTER — Encounter (HOSPITAL_COMMUNITY)
Admission: RE | Admit: 2021-10-14 | Discharge: 2021-10-14 | Disposition: A | Discharge status: Home / Self Care | Payer: Medicare Other | Source: Ambulatory Visit | Attending: Internal Medicine | Admitting: Internal Medicine

## 2021-10-16 ENCOUNTER — Ambulatory Visit (HOSPITAL_COMMUNITY): Payer: Medicare Other | Admitting: Anesthesiology

## 2021-10-16 ENCOUNTER — Telehealth: Payer: Self-pay

## 2021-10-16 ENCOUNTER — Encounter (HOSPITAL_COMMUNITY)
Admission: RE | Disposition: A | Discharge status: Home / Self Care | Payer: Self-pay | Source: Home / Self Care | Attending: Internal Medicine

## 2021-10-16 ENCOUNTER — Encounter (HOSPITAL_COMMUNITY): Payer: Self-pay | Admitting: Internal Medicine

## 2021-10-16 ENCOUNTER — Ambulatory Visit (HOSPITAL_COMMUNITY)
Admission: RE | Admit: 2021-10-16 | Discharge: 2021-10-16 | Disposition: A | Discharge status: Home / Self Care | Payer: Medicare Other | Attending: Internal Medicine | Admitting: Internal Medicine

## 2021-10-16 ENCOUNTER — Ambulatory Visit (HOSPITAL_BASED_OUTPATIENT_CLINIC_OR_DEPARTMENT_OTHER): Payer: Medicare Other | Admitting: Anesthesiology

## 2021-10-16 ENCOUNTER — Other Ambulatory Visit: Payer: Self-pay

## 2021-10-16 DIAGNOSIS — I5032 Chronic diastolic (congestive) heart failure: Secondary | ICD-10-CM | POA: Diagnosis not present

## 2021-10-16 DIAGNOSIS — E119 Type 2 diabetes mellitus without complications: Secondary | ICD-10-CM

## 2021-10-16 DIAGNOSIS — G4730 Sleep apnea, unspecified: Secondary | ICD-10-CM | POA: Insufficient documentation

## 2021-10-16 DIAGNOSIS — Z86010 Personal history of colonic polyps: Secondary | ICD-10-CM | POA: Diagnosis not present

## 2021-10-16 DIAGNOSIS — K3189 Other diseases of stomach and duodenum: Secondary | ICD-10-CM

## 2021-10-16 DIAGNOSIS — Z85118 Personal history of other malignant neoplasm of bronchus and lung: Secondary | ICD-10-CM | POA: Insufficient documentation

## 2021-10-16 DIAGNOSIS — E1122 Type 2 diabetes mellitus with diabetic chronic kidney disease: Secondary | ICD-10-CM | POA: Diagnosis not present

## 2021-10-16 DIAGNOSIS — N183 Chronic kidney disease, stage 3 unspecified: Secondary | ICD-10-CM | POA: Insufficient documentation

## 2021-10-16 DIAGNOSIS — I2510 Atherosclerotic heart disease of native coronary artery without angina pectoris: Secondary | ICD-10-CM | POA: Insufficient documentation

## 2021-10-16 DIAGNOSIS — K319 Disease of stomach and duodenum, unspecified: Secondary | ICD-10-CM | POA: Insufficient documentation

## 2021-10-16 DIAGNOSIS — D509 Iron deficiency anemia, unspecified: Secondary | ICD-10-CM | POA: Diagnosis not present

## 2021-10-16 DIAGNOSIS — Z7984 Long term (current) use of oral hypoglycemic drugs: Secondary | ICD-10-CM | POA: Diagnosis not present

## 2021-10-16 DIAGNOSIS — Z6841 Body Mass Index (BMI) 40.0 and over, adult: Secondary | ICD-10-CM | POA: Diagnosis not present

## 2021-10-16 DIAGNOSIS — J45909 Unspecified asthma, uncomplicated: Secondary | ICD-10-CM | POA: Diagnosis not present

## 2021-10-16 DIAGNOSIS — I130 Hypertensive heart and chronic kidney disease with heart failure and stage 1 through stage 4 chronic kidney disease, or unspecified chronic kidney disease: Secondary | ICD-10-CM | POA: Diagnosis not present

## 2021-10-16 DIAGNOSIS — Z87891 Personal history of nicotine dependence: Secondary | ICD-10-CM | POA: Diagnosis not present

## 2021-10-16 HISTORY — PX: BIOPSY: SHX5522

## 2021-10-16 HISTORY — PX: ESOPHAGOGASTRODUODENOSCOPY (EGD) WITH PROPOFOL: SHX5813

## 2021-10-16 LAB — GLUCOSE, CAPILLARY
Glucose-Capillary: 177 mg/dL — ABNORMAL HIGH (ref 70–99)
Glucose-Capillary: 170 mg/dL — ABNORMAL HIGH (ref 70–99)

## 2021-10-16 SURGERY — ESOPHAGOGASTRODUODENOSCOPY (EGD) WITH PROPOFOL
Anesthesia: General

## 2021-10-16 MED ORDER — LIDOCAINE VISCOUS HCL 2 % MT SOLN
OROMUCOSAL | Status: AC
  Filled 2021-10-16: qty 15

## 2021-10-16 MED ORDER — PROPOFOL 500 MG/50ML IV EMUL
INTRAVENOUS | Status: DC | PRN
  Administered 2021-10-16: 150 ug/kg/min via INTRAVENOUS

## 2021-10-16 MED ORDER — PANTOPRAZOLE SODIUM 40 MG PO TBEC: 40.0000 mg | DELAYED_RELEASE_TABLET | Freq: Every day | ORAL | 11 refills | Status: DC

## 2021-10-16 MED ORDER — LIDOCAINE HCL (CARDIAC) PF 100 MG/5ML IV SOSY
PREFILLED_SYRINGE | INTRAVENOUS | Status: DC | PRN
  Administered 2021-10-16: 50 mg via INTRAVENOUS

## 2021-10-16 MED ORDER — LACTATED RINGERS IV SOLN: INTRAVENOUS | Status: DC

## 2021-10-16 MED ORDER — LIDOCAINE VISCOUS HCL 2 % MT SOLN
15.0000 mL | Freq: Once | OROMUCOSAL | Status: AC
  Administered 2021-10-16: 15 mL via OROMUCOSAL

## 2021-10-16 MED ORDER — PANTOPRAZOLE SODIUM 40 MG PO TBEC: 40.0000 mg | DELAYED_RELEASE_TABLET | Freq: Every day | ORAL | 3 refills | Status: DC

## 2021-10-16 MED ORDER — PROPOFOL 10 MG/ML IV BOLUS
INTRAVENOUS | Status: DC | PRN
  Administered 2021-10-16: 100 mg via INTRAVENOUS

## 2021-10-16 NOTE — Transfer of Care (Signed)
Immediate Anesthesia Transfer of Care Note  Patient: [name]  Procedure(s) Performed: ESOPHAGOGASTRODUODENOSCOPY (EGD) WITH PROPOFOL BIOPSY  Patient Location: Short Stay  Anesthesia Type:General  Level of Consciousness: awake  Airway & Oxygen Therapy: Patient Spontanous Breathing  Post-op Assessment: Report given to RN and Post -op Vital signs reviewed and stable  Post vital signs: Reviewed and stable  Last Vitals:  Vitals Value Taken Time  BP    Temp    Pulse    Resp    SpO2      Last Pain:  Vitals:   10/16/21 1126  TempSrc:   PainSc: 0-No pain      Patients Stated Pain Goal: 5 (53/64/68 0321)  Complications: No notable events documented.

## 2021-10-16 NOTE — Discharge Instructions (Addendum)
EGD Discharge instructions Please read the instructions outlined below and refer to this sheet in the next few weeks. These discharge instructions provide you with general information on caring for yourself after you leave the hospital. Your doctor may also give you specific instructions. While your treatment has been planned according to the most current medical practices available, unavoidable complications occasionally occur. If you have any problems or questions after discharge, please call your doctor. ACTIVITY You may resume your regular activity but move at a slower pace for the next 24 hours.  Take frequent rest periods for the next 24 hours.  Walking will help expel (get rid of) the air and reduce the bloated feeling in your abdomen.  No driving for 24 hours (because of the anesthesia (medicine) used during the test).  You may shower.  Do not sign any important legal documents or operate any machinery for 24 hours (because of the anesthesia used during the test).  NUTRITION Drink plenty of fluids.  You may resume your normal diet.  Begin with a light meal and progress to your normal diet.  Avoid alcoholic beverages for 24 hours or as instructed by your caregiver.  MEDICATIONS You may resume your normal medications unless your caregiver tells you otherwise.  WHAT YOU CAN EXPECT TODAY You may experience abdominal discomfort such as a feeling of fullness or "gas" pains.  FOLLOW-UP Your doctor will discuss the results of your test with you.  SEEK IMMEDIATE MEDICAL ATTENTION IF ANY OF THE FOLLOWING OCCUR: Excessive nausea (feeling sick to your stomach) and/or vomiting.  Severe abdominal pain and distention (swelling).  Trouble swallowing.  Temperature over 101 F (37.8 C).  Rectal bleeding or vomiting of blood.     Your stomach is inflamed.  Biopsies taken.  Capsule study of the small intestine not needed  Begin Protonix 40 mg once daily-new prescription provided through my  office  Office visit with Aliene Altes in 3 months  Further recommendations to follow once I get the lab report back for review  Resume Eliquis tomorrow.  At patient request, I called Hewitt Blade at [phone]  -   reviewed findings and recommendations

## 2021-10-16 NOTE — Anesthesia Preprocedure Evaluation (Signed)
Anesthesia Evaluation  Patient identified by MRN, date of birth, ID band Patient awake    Reviewed: Allergy & Precautions, NPO status , Patient's Chart, lab work & pertinent test results  Airway Mallampati: II  TM Distance: >3 FB Neck ROM: Full    Dental  (+) Missing, Dental Advisory Given   Pulmonary shortness of breath and with exertion, asthma , sleep apnea and Continuous Positive Airway Pressure Ventilation , pneumonia, former smoker,  Left endobronchial carcinoid   Pulmonary exam normal breath sounds clear to auscultation       Cardiovascular Exercise Tolerance: Poor hypertension, Pt. on medications + CAD and +CHF  Normal cardiovascular exam Rhythm:Regular Rate:Normal     Neuro/Psych    GI/Hepatic negative GI ROS, Neg liver ROS,   Endo/Other  diabetes, Well Controlled, Type 2, Oral Hypoglycemic AgentsMorbid obesity  Renal/GU Renal InsufficiencyRenal disease (not on dialysis)     Musculoskeletal  (+) Arthritis ,   Abdominal   Peds  Hematology  (+) Blood dyscrasia, anemia ,   Anesthesia Other Findings   Reproductive/Obstetrics                             Anesthesia Physical Anesthesia Plan  ASA: 4  Anesthesia Plan: General   Post-op Pain Management: Minimal or no pain anticipated   Induction: Intravenous  PONV Risk Score and Plan: Propofol infusion  Airway Management Planned: Nasal Cannula and Natural Airway  Additional Equipment:   Intra-op Plan:   Post-operative Plan:   Informed Consent: I have reviewed the patients History and Physical, chart, labs and discussed the procedure including the risks, benefits and alternatives for the proposed anesthesia with the patient or authorized representative who has indicated his/her understanding and acceptance.     Dental advisory given and Consent reviewed with POA  Plan Discussed with: CRNA and Surgeon  Anesthesia Plan  Comments:         Anesthesia Quick Evaluation

## 2021-10-16 NOTE — Telephone Encounter (Signed)
Rx was sent in as requested.

## 2021-10-16 NOTE — Telephone Encounter (Signed)
-----   Message from Daneil Dolin, MD sent at 10/16/2021 11:39 AM EDT ----- Regarding: New prescription Please call in a prescription for this patient.  EGD today.  Protonix 40 mg once daily 30 minutes before breakfast.  Dispense 30 with 11 refills.  Thanks.

## 2021-10-16 NOTE — Op Note (Signed)
[facility] Patient Name: [name] Procedure Date: 10/16/2021 11:11 AM MRN: [id number] Date of Birth: [birth year] Attending MD: Norvel Richards , MD CSN: [id number] Age: 66 Admit Type: Outpatient Procedure:                Upper GI endoscopy Indications:              Iron deficiency anemia Providers:                Norvel Richards, MD, Lambert Mody, Caprice Kluver, Everardo Pacific, [place] Risa Grill,                            Technician Referring MD:              Medicines:                Propofol per Anesthesia Complications:            No immediate complications. Estimated Blood Loss:     Estimated blood loss was minimal. Procedure:                Pre-Anesthesia Assessment:                           - Prior to the procedure, a History and Physical                            was performed, and patient medications and                            allergies were reviewed. The patient's tolerance of                            previous anesthesia was also reviewed. The risks                            and benefits of the procedure and the sedation                            options and risks were discussed with the patient.                            All questions were answered, and informed consent                            was obtained. Prior Anticoagulants: The patient has                            taken no previous anticoagulant or antiplatelet                            agents. ASA Grade Assessment: III - A patient with  severe systemic disease. After reviewing the risks                            and benefits, the patient was deemed in                            satisfactory condition to undergo the procedure.                           After obtaining informed consent, the endoscope was                            passed under direct vision. Throughout the                            procedure, the  patient's blood pressure, pulse, and                            oxygen saturations were monitored continuously. The                            [id number]-H190 ([id number]) scope was introduced through the                            mouth, and advanced to the second part of duodenum.                            The upper GI endoscopy was accomplished without                            difficulty. The patient tolerated the procedure                            well. Scope In: 11:30:20 AM Scope Out: 11:37:01 AM Total Procedure Duration: 0 hours 6 minutes 41 seconds  Findings:      The examined esophagus was normal. Stomach empty. Angry appearing       gastric mucosa diffusely with erythema patchy erosions nodularity in the       antrum. No ulcer or infiltrating process observed. But markedly friable       mucosa with touch bleeding. Pylorus patent.      The duodenal bulb and second portion of the duodenum were normal.      Biopsies of the antrum body and fundus taken for histologic study. Impression:               - Normal esophagus. Markedly abnormal stomach of                            uncertain significance. No history of NSAID                            ingestion. She is likely iron deficient due to GI                            (  gastric) blood loss. Capsule not indicated at this                            time.                           - Normal duodenal bulb and second portion of the                            duodenum.                           - . Moderate Sedation:      Moderate (conscious) sedation was personally administered by an       anesthesia professional. The following parameters were monitored: oxygen       saturation, heart rate, blood pressure, respiratory rate, EKG, adequacy       of pulmonary ventilation, and response to care. Recommendation:           - Patient has a contact number available for                            emergencies. The signs and symptoms of potential                             delayed complications were discussed with the                            patient. Return to normal activities tomorrow.                            Written discharge instructions were provided to the                            patient.                           - Advance diet as tolerated.                           - Continue present medications.                           - Return to my office. Appointment not yet                            scheduled. Resume Eliquis tomorrow. Follow-up on                            pathology. Begin Protonix 40 mg once daily. Further                            recommendations to follow. Procedure Code(s):        --- Professional ---                           [phone], Esophagogastroduodenoscopy, flexible,  transoral; diagnostic, including collection of                            specimen(s) by brushing or washing, when performed                            (separate procedure) Diagnosis Code(s):        --- Professional ---                           D50.9, Iron deficiency anemia, unspecified CPT copyright 2019 American Medical Association. All rights reserved. The codes documented in this report are preliminary and upon coder review may  be revised to meet current compliance requirements. Cristopher Estimable. Carden Teel, MD Norvel Richards, MD 10/16/2021 11:44:40 AM This report has been signed electronically. Number of Addenda: 0

## 2021-10-16 NOTE — Anesthesia Postprocedure Evaluation (Signed)
Anesthesia Post Note  Patient: [name]  Procedure(s) Performed: ESOPHAGOGASTRODUODENOSCOPY (EGD) WITH PROPOFOL BIOPSY  Patient location during evaluation: Phase II Anesthesia Type: General Level of consciousness: awake and alert and oriented Pain management: pain level controlled Vital Signs Assessment: post-procedure vital signs reviewed and stable Respiratory status: spontaneous breathing, nonlabored ventilation and respiratory function stable Cardiovascular status: blood pressure returned to baseline and stable Postop Assessment: no apparent nausea or vomiting Anesthetic complications: no   No notable events documented.   Last Vitals:  Vitals:   10/16/21 1028 10/16/21 1140  BP: (!) 129/55 131/62  Pulse: 60 70  Resp: 16 (!) 21  Temp: 36.4 C 36.6 C  SpO2: 98% 99%    Last Pain:  Vitals:   10/16/21 1140  TempSrc: Oral  PainSc: 0-No pain                 Lakhia Gengler C Kada Friesen

## 2021-10-16 NOTE — H&P (Signed)
@LOGO @   Primary Care Physician:  Carrolyn Meiers, MD Primary Gastroenterologist:  Dr. [name]  Pre-Procedure History & Physical: HPI:  [name] is a 66 y.o. female here for further evaluation of iron deficiency anemia; EGD plus minus capsule study the small bowel.  Colonoscopy last year demonstrated adenomas removed. Previously gastric and duodenal biopsies negative for H. pylori and celiac disease.  Eliquis held 2 days ago.   Past Medical History:  Diagnosis Date   Arthritis    Asthma    Atrial fibrillation ([place])    Carcinoid tumor determined by biopsy of lung 05/2018   Left lung   Chronic diastolic CHF (congestive heart failure) ([place])    CKD (chronic kidney disease), stage III ([place])    Diabetes mellitus type 2 in obese [facility])    Essential hypertension    Iron deficiency anemia 10/16/2010   Mental handicap 10/16/2010   Mild CAD 2013   Morbid obesity ([facility])    OSA (obstructive sleep apnea)     Past Surgical History:  Procedure Laterality Date   ABDOMINAL HYSTERECTOMY     AV FISTULA PLACEMENT Right 06/06/2018   Procedure: ARTERIOVENOUS (AV) FISTULA CREATION;  Surgeon: Marty Heck, MD;  Location: [facility];  Service: Vascular;  Laterality: Right;   BRONCHIAL DILITATION  12/13/2020   Procedure: BRONCHIAL DILITATION;  Surgeon: Garner Nash, DO;  Location: [facility];  Service: Pulmonary;;   CESAREAN SECTION     CHOLECYSTECTOMY     COLONOSCOPY  08/2010   normal TI, sigmoid polyp (adenoma ). Next TCS due  08/2015,   COLONOSCOPY WITH PROPOFOL N/A 07/08/2020   Procedure: COLONOSCOPY WITH PROPOFOL;  Surgeon: Daneil Dolin, MD;  [facility];  Service: Endoscopy;  Laterality: N/A;  AM (diabetic and facility patient)   ESOPHAGOGASTRODUODENOSCOPY  08/2010   antral and duodenal erosions s/p bx (chronic gastritis, no h.pylori, no celiac dz ), hiatal hernia   FISTULA SUPERFICIALIZATION Right 08/25/2018   Procedure: FISTULA SUPERFICIALIZATION RIGHT ARM;   Surgeon: Waynetta Sandy, MD;  Location: [facility];  Service: Cardiovascular;  Laterality: Right;   IR FLUORO GUIDE CV LINE RIGHT  05/31/2018   IR US GUIDE VASC ACCESS RIGHT  05/31/2018   KNEE SURGERY     right knee @ 66 years of age   LEFT AND RIGHT HEART CATHETERIZATION WITH CORONARY ANGIOGRAM N/A 09/21/2011   Procedure: LEFT AND RIGHT HEART CATHETERIZATION WITH CORONARY ANGIOGRAM;  Surgeon: Birdie Riddle, MD;  Location: [facility];  Service: Cardiovascular;  Laterality: N/A;   POLYPECTOMY  07/08/2020   Procedure: POLYPECTOMY INTESTINAL;  Surgeon: Daneil Dolin, MD;  [facility];  Service: Endoscopy;;   VIDEO BRONCHOSCOPY Left 12/13/2020   Procedure: VIDEO BRONCHOSCOPY WITHOUT FLUORO;  Surgeon: Garner Nash, DO;  Location: [facility];  Service: Pulmonary;  Laterality: Left;  Cryotherapy    Prior to Admission medications   Medication Sig Start Date End Date Taking? Authorizing Provider  acetaminophen (TYLENOL) 325 MG tablet Take 325 mg by mouth every 12 (twelve) hours as needed for moderate pain.   Yes [provider]  albuterol (PROVENTIL HFA;VENTOLIN HFA) 108 (90 Base) MCG/ACT inhaler Inhale 2 puffs into the lungs every 4 (four) hours as needed for shortness of breath.   Yes [provider]  apixaban (ELIQUIS) 5 MG TABS tablet Take 1 tablet (5 mg total) by mouth 2 (two) times daily. 06/09/18  Yes Dessa Phi, DO  benzonatate (TESSALON) 100 MG capsule Take 100 mg  by mouth 2 (two) times daily as needed for cough.   Yes [provider]  betamethasone dipropionate (DIPROLENE) 0.05 % ointment Apply 1 application  topically 2 (two) times daily as needed (for rash under breasts). 08/04/19  Yes [provider]  calcitRIOL (ROCALTROL) 0.25 MCG capsule Take 0.25 mcg by mouth every Monday, Wednesday, and Friday. 08/08/20  Yes [provider]  cetirizine (ZYRTEC) 10 MG tablet Take 10 mg by mouth daily. 06/06/19  Yes [provider]   Cholecalciferol (VITAMIN D3) 50 MCG (2000 UT) TABS TAKE 1 TABLET BY MOUTH ONCE DAILY. 01/02/21  Yes Nida, Marella Chimes, MD  clotrimazole (LOTRIMIN) 1 % cream Apply 1 application  topically 2 (two) times daily as needed (for rash under breasts).   Yes [provider]  diltiazem (CARDIZEM CD) 240 MG 24 hr capsule Take 1 capsule (240 mg total) by mouth daily. 06/10/18  Yes Dessa Phi, DO  Finerenone (KERENDIA) 10 MG TABS Take 10 mg by mouth daily.   Yes [provider]  fluticasone (FLONASE) 50 MCG/ACT nasal spray Place 2 sprays into both nostrils daily as needed for allergies.   Yes [provider]  Fluticasone-Umeclidin-Vilant (TRELEGY ELLIPTA) 100-62.5-25 MCG/ACT AEPB Inhale 1 puff into the lungs daily.   Yes [provider]  furosemide (LASIX) 40 MG tablet Take 40 mg by mouth See admin instructions. Take 40 mg twice daily, alternating with 40 mg daily every other day 04/20/19  Yes [provider]  HUMULIN R U-500 KWIKPEN 500 UNIT/ML KwikPen INJECT 40 UNITS SUBCUTANEOUSLY AT BREAKFAST; INJECT 30 UNITS AT LUNCH & SUPPER; WHEN GLUCOSE IS ABOVE 90 & EATING.(HOLD IF BS BELOW 70: CALL MD IF BS ABOVE 400 09/19/21  Yes Nida, Marella Chimes, MD  ipratropium-albuterol (DUONEB) 0.5-2.5 (3) MG/3ML SOLN Take 3 mLs by nebulization every 6 (six) hours as needed (for cough).   Yes [provider]  isosorbide dinitrate (ISORDIL) 10 MG tablet Take 1 tablet (10 mg total) by mouth 3 (three) times daily. 06/09/18  Yes Dessa Phi, DO  Lancets 28G MISC Use to test BG qid. E11.65 10/07/21  Yes Nida, Marella Chimes, MD  loperamide (IMODIUM) 2 MG capsule Take 2-4 mg by mouth See admin instructions. Take 4 mg by mouth now: then 2 mg after each loose stool as needed, do not exceed 5 doses in 24 hours.   Yes [provider]  losartan (COZAAR) 25 MG tablet Take 12.5 mg by mouth daily.   Yes [provider]  metolazone (ZAROXOLYN) 5 MG tablet Take 5 mg  by mouth in the morning.   Yes [provider]  metoprolol tartrate (LOPRESSOR) 50 MG tablet Take 50 mg by mouth 2 (two) times daily. 05/08/20  Yes [provider]  montelukast (SINGULAIR) 10 MG tablet Take 10 mg by mouth at bedtime.   Yes [provider]  polyethylene glycol powder (GLYCOLAX/MIRALAX) 17 GM/SCOOP powder Take 17 g by mouth daily as needed for mild constipation or moderate constipation.   Yes [provider]  potassium chloride SA (KLOR-CON M) 20 MEQ tablet Take 20 mEq by mouth daily. 09/11/19  Yes [provider]  rosuvastatin (CRESTOR) 20 MG tablet TAKE (1) TABLET BY MOUTH ONCE DAILY. 12/20/19  Yes Nida, Marella Chimes, MD  triamcinolone cream (KENALOG) 0.5 % Apply 1 application topically every 12 (twelve) hours as needed (applied to bilateral legs for allergic dermatitis).   Yes [provider]  Vitamin D, Cholecalciferol, 25 MCG (1000 UT) TABS TAKE 1 TABLET  BY MOUTH ONCE DAILY. 06/16/21  Yes Nida, Marella Chimes, MD  DROPSAFE SAFETY PEN NEEDLES 31G X 6 MM MISC  10/03/20   [provider]  EASYMAX TEST test strip CHECK BLOOD SUGAR 4 TIMES A DAY. (BREAKFAST, LUNCH, SUPPER & BEDTIME) 09/25/21   Nida, Marella Chimes, MD  guaiFENesin (MUCINEX) 600 MG 12 hr tablet Take 1 tablet (600 mg total) by mouth 2 (two) times daily as needed. Patient not taking: Reported on 08/20/2021 02/20/20   Erma Heritage, PA-C    Allergies as of 08/22/2021   (No Known Allergies)    Family History  Problem Relation Age of Onset   Diabetes Mother    Hypertension Mother    Heart failure Mother    Hypertension Father    Atrial fibrillation Father    Hypertension Son    Kidney disease Other        son reportedly has had cyst on kidney and lung s/p surgery at [facility]   Colon cancer Neg Hx     Social History   Socioeconomic History   Marital status: Divorced    Spouse name: Not on file   Number of children: 1   Years of education: Not  on file   Highest education level: Not on file  Occupational History    Employer: UNEMPLOYED  Tobacco Use   Smoking status: Former    Packs/day: 1.00    Years: 0.00    Total pack years: 0.00    Types: Cigarettes    Quit date: 04/11/1996    Years since quitting: 25.5   Smokeless tobacco: Never   Tobacco comments:    quit a couple year ago  Vaping Use   Vaping Use: Never used  Substance and Sexual Activity   Alcohol use: No   Drug use: No   Sexual activity: Never  Other Topics Concern   Not on file  Social History Narrative   Lives with parents.    Social Determinants of Health   Financial Resource Strain: Not on file  Food Insecurity: Not on file  Transportation Needs: Not on file  Physical Activity: Not on file  Stress: Not on file  Social Connections: Not on file  Intimate Partner Violence: Not on file    Review of Systems: See HPI, otherwise negative ROS  Physical Exam: BP (!) 129/55   Pulse 60   Temp 97.6 F (36.4 C) (Oral)   Resp 16   SpO2 98%  General:   Alert,  Well-developed, well-nourished, pleasant and cooperative in NAD Mouth:  No deformity or lesions. Neck:  Supple; no masses or thyromegaly. No significant cervical adenopathy. Lungs:  Clear throughout to auscultation.   No wheezes, crackles, or rhonchi. No acute distress. Heart:  Regular rate and rhythm; no murmurs, clicks, rubs,  or gallops. Abdomen: Non-distended, normal bowel sounds.  Soft and nontender without appreciable mass or hepatosplenomegaly.  Pulses:  Normal pulses noted. Extremities:  Without clubbing or edema.  Impression/Plan: 66 year old lady with iron deficiency anemia in the setting of multiple comorbidities.  Here for an EGD with possible capsule study the small bowel.  She denies dysphagia. The risks, benefits, limitations, alternatives and imponderables have been reviewed with the patient. Potential for esophageal dilation, biopsy, etc. have also been reviewed.  Questions have  been answered. All parties agreeable.      Notice: This dictation was prepared with Dragon dictation along with smaller phrase technology. Any transcriptional errors that result from this process are unintentional and may not  be corrected upon review.

## 2021-10-17 ENCOUNTER — Encounter: Payer: Self-pay | Admitting: Internal Medicine

## 2021-10-17 LAB — SURGICAL PATHOLOGY

## 2021-10-22 ENCOUNTER — Encounter (HOSPITAL_COMMUNITY): Payer: Self-pay | Admitting: Internal Medicine

## 2021-10-28 DIAGNOSIS — M79674 Pain in right toe(s): Secondary | ICD-10-CM | POA: Diagnosis not present

## 2021-10-28 DIAGNOSIS — M79675 Pain in left toe(s): Secondary | ICD-10-CM | POA: Diagnosis not present

## 2021-10-28 DIAGNOSIS — B351 Tinea unguium: Secondary | ICD-10-CM | POA: Diagnosis not present

## 2021-11-05 ENCOUNTER — Ambulatory Visit (INDEPENDENT_AMBULATORY_CARE_PROVIDER_SITE_OTHER): Payer: Medicare Other | Admitting: "Endocrinology

## 2021-11-05 ENCOUNTER — Encounter: Payer: Self-pay | Admitting: "Endocrinology

## 2021-11-05 VITALS — BP 118/78 | HR 60 | Ht 62.0 in | Wt 257.8 lb

## 2021-11-05 DIAGNOSIS — E1122 Type 2 diabetes mellitus with diabetic chronic kidney disease: Secondary | ICD-10-CM | POA: Diagnosis not present

## 2021-11-05 DIAGNOSIS — I10 Essential (primary) hypertension: Secondary | ICD-10-CM

## 2021-11-05 DIAGNOSIS — Z794 Long term (current) use of insulin: Secondary | ICD-10-CM | POA: Diagnosis not present

## 2021-11-05 DIAGNOSIS — N184 Chronic kidney disease, stage 4 (severe): Secondary | ICD-10-CM

## 2021-11-05 DIAGNOSIS — E782 Mixed hyperlipidemia: Secondary | ICD-10-CM

## 2021-11-05 LAB — POCT GLYCOSYLATED HEMOGLOBIN (HGB A1C): HbA1c, POC (controlled diabetic range): 6.2 % (ref 0.0–7.0)

## 2021-11-05 NOTE — Patient Instructions (Signed)
                                     Advice for Weight Management  -For most of us the best way to lose weight is by diet management. Generally speaking, diet management means consuming less calories intentionally which over time brings about progressive weight loss.  This can be achieved more effectively by avoiding ultra processed carbohydrates, processed meats, unhealthy fats.    It is critically important to know your numbers: how much calorie you are consuming and how much calorie you need. More importantly, our carbohydrates sources should be unprocessed naturally occurring  complex starch food items.  It is always important to balance nutrition also by  appropriate intake of proteins (mainly plant-based), healthy fats/oils, plenty of fruits and vegetables.   -The American College of Lifestyle Medicine (ACL M) recommends nutrition derived mostly from Whole Food, Plant Predominant Sources example an apple instead of applesauce or apple pie. Eat Plenty of vegetables, Mushrooms, fruits, Legumes, Whole Grains, Nuts, seeds in lieu of processed meats, processed snacks/pastries red meat, poultry, eggs.  Use only water or unsweetened tea for hydration.  The College also recommends the need to stay away from risky substances including alcohol, smoking; obtaining 7-9 hours of restorative sleep, at least 150 minutes of moderate intensity exercise weekly, importance of healthy social connections, and being mindful of stress and seek help when it is overwhelming.    -Sticking to a routine mealtime to eat 3 meals a day and avoiding unnecessary snacks is shown to have a big role in weight control. Under normal circumstances, the only time we burn stored energy is when we are hungry, so allow  some hunger to take place- hunger means no food between appropriate meal times, only water.  It is not advisable to starve.   -It is better to avoid simple carbohydrates including:  Cakes, Sweet Desserts, Ice Cream, Soda (diet and regular), Sweet Tea, Candies, Chips, Cookies, Store Bought Juices, Alcohol in Excess of  1-2 drinks a day, Lemonade,  Artificial Sweeteners, Doughnuts, Coffee Creamers, "Sugar-free" Products, etc, etc.  This is not a complete list.....    -Consulting with certified diabetes educators is proven to provide you with the most accurate and current information on diet.  Also, you may be  interested in discussing diet options/exchanges , we can schedule a visit with [name], RDN, CDE for individualized nutrition education.  -Exercise: If you are able: 30 -60 minutes a day ,4 days a week, or 150 minutes of moderate intensity exercise weekly.    The longer the better if tolerated.  Combine stretch, strength, and aerobic activities.  If you were told in the past that you have high risk for cardiovascular diseases, or if you are currently symptomatic, you may seek evaluation by your heart doctor prior to initiating moderate to intense exercise programs.                                  Additional Care Considerations for Diabetes/Prediabetes   -Diabetes  is a chronic disease.  The most important care consideration is regular follow-up with your diabetes care provider with the goal being avoiding or delaying its complications and to take advantage of advances in medications and technology.  If appropriate actions are taken early enough, type 2 diabetes can even be   reversed.  Seek information from the right source.  - Whole Food, Plant Predominant Nutrition is highly recommended: Eat Plenty of vegetables, Mushrooms, fruits, Legumes, Whole Grains, Nuts, seeds in lieu of processed meats, processed snacks/pastries red meat, poultry, eggs as recommended by American College of  Lifestyle Medicine (ACLM).  -Type 2 diabetes is known to coexist with other important comorbidities such as high blood pressure and high cholesterol.  It is critical to control not only the  diabetes but also the high blood pressure and high cholesterol to minimize and delay the risk of complications including coronary artery disease, stroke, amputations, blindness, etc.  The good news is that this diet recommendation for type 2 diabetes is also very helpful for managing high cholesterol and high blood blood pressure.  - Studies showed that people with diabetes will benefit from a class of medications known as ACE inhibitors and statins.  Unless there are specific reasons not to be on these medications, the standard of care is to consider getting one from these groups of medications at an optimal doses.  These medications are generally considered safe and proven to help protect the heart and the kidneys.    - People with diabetes are encouraged to initiate and maintain regular follow-up with eye doctors, foot doctors, dentists , and if necessary heart and kidney doctors.     - It is highly recommended that people with diabetes quit smoking or stay away from smoking, and get yearly  flu vaccine and pneumonia vaccine at least every 5 years.  See above for additional recommendations on exercise, sleep, stress management , and healthy social connections.      

## 2021-11-05 NOTE — Progress Notes (Signed)
11/05/2021  Endocrinology follow-up note   Subjective:    Patient ID: [name], female    DOB: Apr 21, 1955, PCP Carrolyn Meiers, MD   Past Medical History:  Diagnosis Date   Arthritis    Asthma    Atrial fibrillation ([place])    Carcinoid tumor determined by biopsy of lung 05/2018   Left lung   Chronic diastolic CHF (congestive heart failure) ([place])    CKD (chronic kidney disease), stage III ([place])    Diabetes mellitus type 2 in obese [facility])    Essential hypertension    Iron deficiency anemia 10/16/2010   Mental handicap 10/16/2010   Mild CAD 2013   Morbid obesity ([place])    OSA (obstructive sleep apnea)    Past Surgical History:  Procedure Laterality Date   ABDOMINAL HYSTERECTOMY     AV FISTULA PLACEMENT Right 06/06/2018   Procedure: ARTERIOVENOUS (AV) FISTULA CREATION;  Surgeon: Marty Heck, MD;  Location: [place];  Service: Vascular;  Laterality: Right;   BIOPSY  10/16/2021   Procedure: BIOPSY;  Surgeon: Daneil Dolin, MD;  [facility];  Service: Endoscopy;;   BRONCHIAL DILITATION  12/13/2020   Procedure: BRONCHIAL DILITATION;  Surgeon: Garner [facility], DO;  Location: [place] [facility];  Service: Pulmonary;;   CESAREAN SECTION     CHOLECYSTECTOMY     COLONOSCOPY  08/2010   normal TI, sigmoid polyp (adenoma ). Next TCS due  08/2015,   COLONOSCOPY WITH PROPOFOL N/A 07/08/2020   Procedure: COLONOSCOPY WITH PROPOFOL;  Surgeon: Daneil Dolin, MD;  [facility];  Service: Endoscopy;  Laterality: N/A;  AM (diabetic and facility patient)   ESOPHAGOGASTRODUODENOSCOPY  08/2010   antral and duodenal erosions s/p bx (chronic gastritis, no h.pylori, no celiac dz ), hiatal hernia   ESOPHAGOGASTRODUODENOSCOPY (EGD) WITH PROPOFOL N/A 10/16/2021   Procedure: ESOPHAGOGASTRODUODENOSCOPY (EGD) WITH PROPOFOL;  Surgeon: Daneil Dolin, MD;  [facility];  Service: Endoscopy;  Laterality: N/A;  11:45am   FISTULA SUPERFICIALIZATION Right 08/25/2018    Procedure: FISTULA SUPERFICIALIZATION RIGHT ARM;  Surgeon: Waynetta Sandy, MD;  Location: [facility];  Service: Cardiovascular;  Laterality: Right;   IR FLUORO GUIDE CV LINE RIGHT  05/31/2018   IR US GUIDE VASC ACCESS RIGHT  05/31/2018   KNEE SURGERY     right knee @ 66 years of age   LEFT AND RIGHT HEART CATHETERIZATION WITH CORONARY ANGIOGRAM N/A 09/21/2011   Procedure: LEFT AND RIGHT HEART CATHETERIZATION WITH CORONARY ANGIOGRAM;  Surgeon: Birdie Riddle, MD;  Location: [facility];  Service: Cardiovascular;  Laterality: N/A;   POLYPECTOMY  07/08/2020   Procedure: POLYPECTOMY INTESTINAL;  Surgeon: Daneil Dolin, MD;  [facility];  Service: Endoscopy;;   VIDEO BRONCHOSCOPY Left 12/13/2020   Procedure: VIDEO BRONCHOSCOPY WITHOUT FLUORO;  Surgeon: Garner [facility], DO;  Location: [place];  Service: Pulmonary;  Laterality: Left;  Cryotherapy   Social History   Socioeconomic History   Marital status: Divorced    Spouse name: Not on file   Number of children: 1   Years of education: Not on file   Highest education level: Not on file  Occupational History    Employer: UNEMPLOYED  Tobacco Use   Smoking status: Former    Packs/day: 1.00    Years: 0.00    Total pack years: 0.00    Types: Cigarettes    Quit date: 04/11/1996    Years since quitting: 25.5   Smokeless tobacco: Never  Tobacco comments:    quit a couple year ago  Vaping Use   Vaping Use: Never used  Substance and Sexual Activity   Alcohol use: No   Drug use: No   Sexual activity: Never  Other Topics Concern   Not on file  Social History Narrative   Lives with parents.    Social Determinants of Health   Financial Resource Strain: Not on file  Food Insecurity: Not on file  Transportation Needs: Not on file  Physical Activity: Not on file  Stress: Not on file  Social Connections: Not on file   Outpatient Encounter Medications as of 11/05/2021  Medication Sig   acetaminophen (TYLENOL) 325 MG  tablet Take 325 mg by mouth every 12 (twelve) hours as needed for moderate pain.   albuterol (PROVENTIL HFA;VENTOLIN HFA) 108 (90 Base) MCG/ACT inhaler Inhale 2 puffs into the lungs every 4 (four) hours as needed for shortness of breath.   apixaban (ELIQUIS) 5 MG TABS tablet Take 1 tablet (5 mg total) by mouth 2 (two) times daily.   benzonatate (TESSALON) 100 MG capsule Take 100 mg by mouth 2 (two) times daily as needed for cough.   betamethasone dipropionate (DIPROLENE) 0.05 % ointment Apply 1 application  topically 2 (two) times daily as needed (for rash under breasts).   calcitRIOL (ROCALTROL) 0.25 MCG capsule Take 0.25 mcg by mouth every Monday, Wednesday, and Friday.   cetirizine (ZYRTEC) 10 MG tablet Take 10 mg by mouth daily.   Cholecalciferol (VITAMIN D3) 50 MCG (2000 UT) TABS TAKE 1 TABLET BY MOUTH ONCE DAILY.   clotrimazole (LOTRIMIN) 1 % cream Apply 1 application  topically 2 (two) times daily as needed (for rash under breasts).   diltiazem (CARDIZEM CD) 240 MG 24 hr capsule Take 1 capsule (240 mg total) by mouth daily.   DROPSAFE SAFETY PEN NEEDLES 31G X 6 MM MISC    EASYMAX TEST test strip CHECK BLOOD SUGAR 4 TIMES A DAY. (BREAKFAST, LUNCH, SUPPER & BEDTIME)   Finerenone (KERENDIA) 10 MG TABS Take 10 mg by mouth daily.   fluticasone (FLONASE) 50 MCG/ACT nasal spray Place 2 sprays into both nostrils daily as needed for allergies.   Fluticasone-Umeclidin-Vilant (TRELEGY ELLIPTA) 100-62.5-25 MCG/ACT AEPB Inhale 1 puff into the lungs daily.   furosemide (LASIX) 40 MG tablet Take 40 mg by mouth See admin instructions. Take 40 mg twice daily, alternating with 40 mg daily every other day   guaiFENesin (MUCINEX) 600 MG 12 hr tablet Take 1 tablet (600 mg total) by mouth 2 (two) times daily as needed. (Patient not taking: Reported on 08/20/2021)   HUMULIN R U-500 KWIKPEN 500 UNIT/ML KwikPen INJECT 40 UNITS SUBCUTANEOUSLY AT BREAKFAST; INJECT 30 UNITS AT LUNCH & SUPPER; WHEN GLUCOSE IS ABOVE 90 &  EATING.(HOLD IF BS BELOW 70: CALL MD IF BS ABOVE 400   ipratropium-albuterol (DUONEB) 0.5-2.5 (3) MG/3ML SOLN Take 3 mLs by nebulization every 6 (six) hours as needed (for cough).   isosorbide dinitrate (ISORDIL) 10 MG tablet Take 1 tablet (10 mg total) by mouth 3 (three) times daily.   Lancets 28G MISC Use to test BG qid. E11.65   loperamide (IMODIUM) 2 MG capsule Take 2-4 mg by mouth See admin instructions. Take 4 mg by mouth now: then 2 mg after each loose stool as needed, do not exceed 5 doses in 24 hours.   losartan (COZAAR) 25 MG tablet Take 12.5 mg by mouth daily.   metolazone (ZAROXOLYN) 5 MG tablet Take 5 mg by  mouth in the morning.   metoprolol tartrate (LOPRESSOR) 50 MG tablet Take 50 mg by mouth 2 (two) times daily.   montelukast (SINGULAIR) 10 MG tablet Take 10 mg by mouth at bedtime.   pantoprazole (PROTONIX) 40 MG tablet Take 1 tablet (40 mg total) by mouth daily.   polyethylene glycol powder (GLYCOLAX/MIRALAX) 17 GM/SCOOP powder Take 17 g by mouth daily as needed for mild constipation or moderate constipation.   potassium chloride SA (KLOR-CON M) 20 MEQ tablet Take 20 mEq by mouth daily.   rosuvastatin (CRESTOR) 20 MG tablet TAKE (1) TABLET BY MOUTH ONCE DAILY.   triamcinolone cream (KENALOG) 0.5 % Apply 1 application topically every 12 (twelve) hours as needed (applied to bilateral legs for allergic dermatitis).   Vitamin D, Cholecalciferol, 25 MCG (1000 UT) TABS TAKE 1 TABLET BY MOUTH ONCE DAILY.   No facility-administered encounter medications on file as of 11/05/2021.   ALLERGIES: No Known Allergies VACCINATION STATUS: Immunization History  Administered Date(s) Administered   Hepatitis B, adult 07/29/2018, 08/29/2018, 10/07/2018   Influenza Inj Mdck Quad Pf 12/19/2016   Influenza,inj,Quad PF,6+ Mos 02/02/2013, 05/15/2018   Influenza-Unspecified 05/15/2018   PPD Test 07/22/2018   Pneumococcal Polysaccharide-23 05/15/2018   Pneumococcal-Unspecified 05/15/2018     Diabetes She presents for her follow-up diabetic visit. She has type 2 diabetes mellitus. Onset time: she was diagnosed at approximate age of 68 years. Her disease course has been stable. There are no hypoglycemic associated symptoms. Pertinent negatives for hypoglycemia include no confusion, headaches, pallor or seizures. Pertinent negatives for diabetes include no blurred vision, no chest pain, no fatigue, no polydipsia, no polyphagia and no polyuria. There are no hypoglycemic complications. Symptoms are stable. Diabetic complications include nephropathy. Risk factors for coronary artery disease include diabetes mellitus, dyslipidemia, hypertension, obesity and sedentary lifestyle. Current diabetic treatment includes intensive insulin program. Her weight is fluctuating minimally. She is following a generally unhealthy diet. She has had a previous visit with a dietitian. She never participates in exercise. Her home blood glucose trend is decreasing steadily. Her breakfast blood glucose range is generally 140-180 mg/dl. Her lunch blood glucose range is generally 140-180 mg/dl. Her dinner blood glucose range is generally 140-180 mg/dl. Her bedtime blood glucose range is generally 140-180 mg/dl. Her overall blood glucose range is 140-180 mg/dl. (She is accompanied by her aide from nursing home.   Her glycemic profile was reviewed found to be near target.  Her point-of-care A1c is 6.2%, improving from 7% on her last visit.) An ACE inhibitor/angiotensin II receptor blocker is being taken.  Hypertension This is a chronic problem. The current episode started more than 1 year ago. The problem is uncontrolled. Pertinent negatives include no blurred vision, chest pain, headaches, palpitations or shortness of breath. Risk factors for coronary artery disease include diabetes mellitus, dyslipidemia, obesity and sedentary lifestyle. Past treatments include ACE inhibitors. Hypertensive end-organ damage includes kidney  disease. Identifiable causes of hypertension include chronic renal disease.  Hyperlipidemia This is a chronic problem. The current episode started more than 1 year ago. The problem is uncontrolled. Exacerbating diseases include chronic renal disease, diabetes and obesity. Pertinent negatives include no chest pain, myalgias or shortness of breath. Current antihyperlipidemic treatment includes statins and fibric acid derivatives. Risk factors for coronary artery disease include diabetes mellitus, dyslipidemia, obesity, a sedentary lifestyle and hypertension.    Review of Systems  Constitutional:  Negative for chills, fatigue and fever.  HENT:  Negative for trouble swallowing and voice change.   Eyes:  Negative for blurred vision and visual disturbance.  Respiratory:  Negative for cough, shortness of breath and wheezing.   Cardiovascular:  Negative for chest pain, palpitations and leg swelling.  Gastrointestinal:  Negative for diarrhea, nausea and vomiting.  Endocrine: Negative for cold intolerance, heat intolerance, polydipsia, polyphagia and polyuria.  Musculoskeletal:  Positive for gait problem. Negative for arthralgias and myalgias.  Skin:  Negative for color change, pallor, rash and wound.  Neurological:  Negative for seizures and headaches.  Hematological:  Does not bruise/bleed easily.  Psychiatric/Behavioral:  Negative for confusion and suicidal ideas.     Objective:    BP 118/78   Pulse 60   Ht 5\' 2"  (1.575 m)   Wt 257 lb 12.8 oz (116.9 kg)   BMI 47.15 kg/m   Wt Readings from Last 3 Encounters:  11/05/21 257 lb 12.8 oz (116.9 kg)  10/13/21 259 lb 14.8 oz (117.9 kg)  08/20/21 260 lb (117.9 kg)      Results for orders placed or performed in visit on 11/05/21  HgB A1c  Result Value Ref Range   Hemoglobin A1C     HbA1c POC (<> result, manual entry)     HbA1c, POC (prediabetic range)     HbA1c, POC (controlled diabetic range) 6.2 0.0 - 7.0 %   Complete Blood Count (Most  recent): Lab Results  Component Value Date   WBC 12.3 (H) 07/28/2021   HGB 11.1 (L) 07/28/2021   HCT 33.8 (L) 07/28/2021   MCV 94.9 07/28/2021   PLT 352 07/28/2021   Chemistry (most recent): Diabetic Labs (most recent): Lab Results  Component Value Date   HGBA1C 6.2 11/05/2021   HGBA1C 7.0 07/31/2021   HGBA1C 6.6 04/14/2021   MICROALBUR 150 04/25/2020   MICROALBUR 23.4 09/12/2019   MICROALBUR 0.2 07/13/2017   Lipid Panel     Component Value Date/Time   CHOL 127 05/07/2020 0000   CHOL 128 04/23/2020 0820   TRIG 150 05/07/2020 0000   HDL 42 05/07/2020 0000   HDL 43 04/23/2020 0820   CHOLHDL 3.0 04/23/2020 0820   CHOLHDL 5.5 (H) 09/12/2019 0831   VLDL 59 (H) 06/02/2018 0029   LDLCALC 62 05/07/2020 0000   LDLCALC 60 04/23/2020 0820   LDLCALC 124 (H) 09/12/2019 0831    Assessment & Plan:   1. Type 2 diabetes mellitus with stage 3-4 chronic kidney disease, with long-term current use of insulin   -Her diabetes is  complicated by CKD, congestive heart failure,  obesity/sedentary life, and patient remains at extremely high risk for more acute and chronic complications of diabetes which include CAD, CVA, CKD, retinopathy, and neuropathy. These are all discussed in detail with the patient.  She is accompanied by her aide from nursing home.   Her glycemic profile was reviewed found to be near target.  Her point-of-care A1c is 6.2%, improving from 7% on her last visit.  - I have re-counseled the patient on diet management and weight loss  by adopting a carbohydrate restricted / protein rich  Diet.   - she acknowledges that there is a room for improvement in her food and drink choices. - Suggestion is made for her to avoid simple carbohydrates  from her diet including Cakes, Sweet Desserts, Ice Cream, Soda (diet and regular), Sweet Tea, Candies, Chips, Cookies, Store Bought Juices, Alcohol in Excess of  1-2 drinks a day, Artificial Sweeteners,  Coffee Creamer, and "Sugar-free"  Products, Lemonade. This will help patient to have more stable blood glucose profile  and potentially avoid unintended weight gain.  - Patient is advised to stick to a routine mealtimes to eat 3 meals  a day and avoid unnecessary snacks ( to snack only to correct hypoglycemia).  - I have approached patient with the following individualized plan to manage diabetes and patient agrees.  -Her placement in group home is the best development for her lately.  -Her presenting glycemic profile is near or on target, with point-of-care A1c of 6.2% improving from 7 %.     -It is better for her to stay on Humulin RU 500 insulin for the sake of simplicity.  -She is advised to continue  Humulin U500 40 units at breakfast, 30 units at lunch, 30 units with supper when glucose is above 90 mg/dl associated with monitoring of blood glucose before meals and at bedtime. - She is advised to skip insulin if pre-meal blood glucose is below 90 mg/dL or if she is not eating. -She is not a candidate for metformin.   - Patient specific target  for A1c; LDL, HDL, Triglycerides, were discussed in detail.  2) BP/HTN:  -Her blood pressure is controlled to target.  She has urine microalbuminuria .She is advised on  salt restriction  and I advised her to continue current medications including lisinopril 20 mg p.o. daily, hydralazine 25 mg p.o. 3 times daily, furosemide 40 mg p.o. Daily.  3) Lipids/HPL: Her recent lipid panel showed significantly improved LDL to 62 from 124.  She is tolerating Crestor, advised to continue Crestor 20 mg p.o. nightly.  Side effects and precautions discussed with her.        She is also on fenofibrate.     4)  Weight/Diet: Her BMI is 47.15-she is gaining weight, and this clearly complicating her diabetes care.   She is a candidate for modest weight loss.  CDE consult in progress, exercise, and carbohydrates information provided.  5) Chronic Care/Health Maintenance:  -Patient is  on ACEI and  encouraged to continue to follow up with Ophthalmology, Podiatrist at least yearly or according to recommendations, and advised to stay away from smoking. I have recommended yearly flu vaccine and pneumonia vaccination at least every 5 years;  and  sleep for at least 7 hours a day.    -She is advised to maintain close follow-up with her nephrologist.  - I advised patient to maintain close follow up with Legrand Rams, Normajean Baxter, MD for primary care needs.   I spent 41 minutes in the care of the patient today including review of labs from [facility], Lipids, Thyroid Function, Hematology (current and previous including abstractions from other facilities); face-to-face time discussing  her blood glucose readings/logs, discussing hypoglycemia and hyperglycemia episodes and symptoms, medications doses, her options of short and long term treatment based on the latest standards of care / guidelines;  discussion about incorporating lifestyle medicine;  and documenting the encounter. Risk reduction counseling performed per USPSTF guidelines to reduce obesity and cardiovascular risk factors.     Please refer to Patient Instructions for Blood Glucose Monitoring and Insulin/Medications Dosing Guide"  in media tab for additional information. Please  also refer to " Patient Self Inventory" in the Media  tab for reviewed elements of pertinent patient history.  [name] participated in the discussions, expressed understanding, and voiced agreement with the above plans.  All questions were answered to her satisfaction. she is encouraged to contact clinic should she have any questions or concerns prior to her return visit.  Follow up plan: -Return in about 4 months (around 03/07/2022) for F/U with Pre-visit Labs, Meter/CGM/Logs, A1c here.  Glade Lloyd, MD Phone: [phone]  Fax: [phone]  This note was partially dictated with voice recognition software. Similar sounding words can be transcribed  inadequately or may not  be corrected upon review.  11/05/2021, 12:44 PM

## 2021-11-12 DIAGNOSIS — E1122 Type 2 diabetes mellitus with diabetic chronic kidney disease: Secondary | ICD-10-CM | POA: Diagnosis not present

## 2021-11-12 DIAGNOSIS — E1129 Type 2 diabetes mellitus with other diabetic kidney complication: Secondary | ICD-10-CM | POA: Diagnosis not present

## 2021-11-12 DIAGNOSIS — E211 Secondary hyperparathyroidism, not elsewhere classified: Secondary | ICD-10-CM | POA: Diagnosis not present

## 2021-11-12 DIAGNOSIS — I5032 Chronic diastolic (congestive) heart failure: Secondary | ICD-10-CM | POA: Diagnosis not present

## 2021-11-12 DIAGNOSIS — N189 Chronic kidney disease, unspecified: Secondary | ICD-10-CM | POA: Diagnosis not present

## 2021-11-12 DIAGNOSIS — E876 Hypokalemia: Secondary | ICD-10-CM | POA: Diagnosis not present

## 2021-11-12 DIAGNOSIS — R809 Proteinuria, unspecified: Secondary | ICD-10-CM | POA: Diagnosis not present

## 2021-11-12 DIAGNOSIS — D631 Anemia in chronic kidney disease: Secondary | ICD-10-CM | POA: Diagnosis not present

## 2021-11-19 DIAGNOSIS — J029 Acute pharyngitis, unspecified: Secondary | ICD-10-CM | POA: Diagnosis not present

## 2021-11-19 DIAGNOSIS — I480 Paroxysmal atrial fibrillation: Secondary | ICD-10-CM | POA: Diagnosis not present

## 2021-11-19 DIAGNOSIS — E1122 Type 2 diabetes mellitus with diabetic chronic kidney disease: Secondary | ICD-10-CM | POA: Diagnosis not present

## 2021-11-27 DIAGNOSIS — E119 Type 2 diabetes mellitus without complications: Secondary | ICD-10-CM | POA: Diagnosis not present

## 2021-11-27 DIAGNOSIS — Z7984 Long term (current) use of oral hypoglycemic drugs: Secondary | ICD-10-CM | POA: Diagnosis not present

## 2021-11-27 DIAGNOSIS — Z794 Long term (current) use of insulin: Secondary | ICD-10-CM | POA: Diagnosis not present

## 2021-11-27 DIAGNOSIS — H25813 Combined forms of age-related cataract, bilateral: Secondary | ICD-10-CM | POA: Diagnosis not present

## 2021-12-10 ENCOUNTER — Telehealth: Payer: Self-pay | Admitting: "Endocrinology

## 2021-12-10 NOTE — Telephone Encounter (Signed)
New message   [facility] checking to see Dr. Dorris Fetch sign off on blood sugar reading   Asking to fax back [phone]

## 2021-12-11 NOTE — Telephone Encounter (Signed)
Blood glucose readings given to Dr.Nida.

## 2021-12-12 ENCOUNTER — Other Ambulatory Visit: Payer: Self-pay | Admitting: "Endocrinology

## 2021-12-16 ENCOUNTER — Telehealth: Payer: Self-pay | Admitting: "Endocrinology

## 2021-12-16 NOTE — Telephone Encounter (Signed)
Re-faxed readings to [facility].

## 2021-12-16 NOTE — Telephone Encounter (Signed)
Have you seen pt's readings?

## 2021-12-16 NOTE — Telephone Encounter (Signed)
F/u   [facility] calling back sign off on blood sugar reading    Fax # [phone]

## 2021-12-17 ENCOUNTER — Inpatient Hospital Stay (HOSPITAL_COMMUNITY)
Admission: EM | Admit: 2021-12-17 | Discharge: 2021-12-19 | DRG: 378 | Disposition: A | Discharge status: Home / Self Care | Payer: Medicare Other | Source: Skilled Nursing Facility | Attending: Family Medicine | Admitting: Family Medicine

## 2021-12-17 ENCOUNTER — Encounter (HOSPITAL_COMMUNITY): Payer: Self-pay | Admitting: Internal Medicine

## 2021-12-17 ENCOUNTER — Other Ambulatory Visit: Payer: Self-pay

## 2021-12-17 ENCOUNTER — Emergency Department (HOSPITAL_COMMUNITY): Payer: Medicare Other

## 2021-12-17 DIAGNOSIS — Z79899 Other long term (current) drug therapy: Secondary | ICD-10-CM | POA: Diagnosis not present

## 2021-12-17 DIAGNOSIS — Z9049 Acquired absence of other specified parts of digestive tract: Secondary | ICD-10-CM | POA: Diagnosis not present

## 2021-12-17 DIAGNOSIS — I5032 Chronic diastolic (congestive) heart failure: Secondary | ICD-10-CM | POA: Diagnosis present

## 2021-12-17 DIAGNOSIS — Z90710 Acquired absence of both cervix and uterus: Secondary | ICD-10-CM

## 2021-12-17 DIAGNOSIS — R42 Dizziness and giddiness: Secondary | ICD-10-CM | POA: Diagnosis not present

## 2021-12-17 DIAGNOSIS — D649 Anemia, unspecified: Secondary | ICD-10-CM | POA: Diagnosis not present

## 2021-12-17 DIAGNOSIS — Z833 Family history of diabetes mellitus: Secondary | ICD-10-CM

## 2021-12-17 DIAGNOSIS — I10 Essential (primary) hypertension: Secondary | ICD-10-CM | POA: Diagnosis present

## 2021-12-17 DIAGNOSIS — K7460 Unspecified cirrhosis of liver: Secondary | ICD-10-CM | POA: Diagnosis present

## 2021-12-17 DIAGNOSIS — I130 Hypertensive heart and chronic kidney disease with heart failure and stage 1 through stage 4 chronic kidney disease, or unspecified chronic kidney disease: Secondary | ICD-10-CM | POA: Diagnosis not present

## 2021-12-17 DIAGNOSIS — R079 Chest pain, unspecified: Secondary | ICD-10-CM | POA: Diagnosis not present

## 2021-12-17 DIAGNOSIS — I2510 Atherosclerotic heart disease of native coronary artery without angina pectoris: Secondary | ICD-10-CM | POA: Diagnosis present

## 2021-12-17 DIAGNOSIS — D62 Acute posthemorrhagic anemia: Secondary | ICD-10-CM

## 2021-12-17 DIAGNOSIS — D509 Iron deficiency anemia, unspecified: Secondary | ICD-10-CM | POA: Diagnosis present

## 2021-12-17 DIAGNOSIS — Z7901 Long term (current) use of anticoagulants: Secondary | ICD-10-CM

## 2021-12-17 DIAGNOSIS — Z794 Long term (current) use of insulin: Secondary | ICD-10-CM | POA: Diagnosis not present

## 2021-12-17 DIAGNOSIS — R000 Tachycardia, unspecified: Secondary | ICD-10-CM | POA: Diagnosis not present

## 2021-12-17 DIAGNOSIS — Z87891 Personal history of nicotine dependence: Secondary | ICD-10-CM | POA: Diagnosis not present

## 2021-12-17 DIAGNOSIS — Z6841 Body Mass Index (BMI) 40.0 and over, adult: Secondary | ICD-10-CM | POA: Diagnosis not present

## 2021-12-17 DIAGNOSIS — Z20822 Contact with and (suspected) exposure to covid-19: Secondary | ICD-10-CM | POA: Diagnosis present

## 2021-12-17 DIAGNOSIS — K31811 Angiodysplasia of stomach and duodenum with bleeding: Secondary | ICD-10-CM | POA: Diagnosis not present

## 2021-12-17 DIAGNOSIS — G4733 Obstructive sleep apnea (adult) (pediatric): Secondary | ICD-10-CM | POA: Diagnosis present

## 2021-12-17 DIAGNOSIS — Z8249 Family history of ischemic heart disease and other diseases of the circulatory system: Secondary | ICD-10-CM | POA: Diagnosis not present

## 2021-12-17 DIAGNOSIS — R059 Cough, unspecified: Secondary | ICD-10-CM | POA: Diagnosis not present

## 2021-12-17 DIAGNOSIS — J449 Chronic obstructive pulmonary disease, unspecified: Secondary | ICD-10-CM | POA: Diagnosis present

## 2021-12-17 DIAGNOSIS — E1165 Type 2 diabetes mellitus with hyperglycemia: Secondary | ICD-10-CM | POA: Diagnosis not present

## 2021-12-17 DIAGNOSIS — I4891 Unspecified atrial fibrillation: Secondary | ICD-10-CM | POA: Diagnosis present

## 2021-12-17 DIAGNOSIS — N184 Chronic kidney disease, stage 4 (severe): Secondary | ICD-10-CM | POA: Diagnosis present

## 2021-12-17 DIAGNOSIS — K922 Gastrointestinal hemorrhage, unspecified: Secondary | ICD-10-CM | POA: Diagnosis not present

## 2021-12-17 DIAGNOSIS — Z86010 Personal history of colonic polyps: Secondary | ICD-10-CM

## 2021-12-17 DIAGNOSIS — E1122 Type 2 diabetes mellitus with diabetic chronic kidney disease: Secondary | ICD-10-CM | POA: Diagnosis not present

## 2021-12-17 DIAGNOSIS — I959 Hypotension, unspecified: Secondary | ICD-10-CM | POA: Diagnosis not present

## 2021-12-17 LAB — BASIC METABOLIC PANEL
Anion gap: 9 (ref 5–15)
BUN: 65 mg/dL — ABNORMAL HIGH (ref 8–23)
CO2: 26 mmol/L (ref 22–32)
Calcium: 8.8 mg/dL — ABNORMAL LOW (ref 8.9–10.3)
Chloride: 98 mmol/L (ref 98–111)
Creatinine, Ser: 2.29 mg/dL — ABNORMAL HIGH (ref 0.44–1.00)
GFR, Estimated: 23 mL/min — ABNORMAL LOW (ref >60)
Glucose, Bld: 119 mg/dL — ABNORMAL HIGH (ref 70–99)
Potassium: 4.4 mmol/L (ref 3.5–5.1)
Sodium: 133 mmol/L — ABNORMAL LOW (ref 135–145)

## 2021-12-17 LAB — CBC
HCT: 16 % — ABNORMAL LOW (ref 36.0–46.0)
Hemoglobin: 4.8 g/dL — CL (ref 12.0–15.0)
MCH: 27.7 pg (ref 26.0–34.0)
MCHC: 30 g/dL (ref 30.0–36.0)
MCV: 92.5 fL (ref 80.0–100.0)
Platelets: 310 10*3/uL (ref 150–400)
RBC: 1.73 MIL/uL — ABNORMAL LOW (ref 3.87–5.11)
RDW: 16.6 % — ABNORMAL HIGH (ref 11.5–15.5)
WBC: 11 10*3/uL — ABNORMAL HIGH (ref 4.0–10.5)
nRBC: 0.7 % — ABNORMAL HIGH (ref 0.0–0.2)

## 2021-12-17 LAB — HEMOGLOBIN A1C
Hgb A1c MFr Bld: 5.1 % (ref 4.8–5.6)
Mean Plasma Glucose: 99.67 mg/dL

## 2021-12-17 LAB — SARS CORONAVIRUS 2 BY RT PCR: SARS Coronavirus 2 by RT PCR: NEGATIVE

## 2021-12-17 LAB — BRAIN NATRIURETIC PEPTIDE: B Natriuretic Peptide: 461 pg/mL — ABNORMAL HIGH (ref 0.0–100.0)

## 2021-12-17 LAB — PREPARE RBC (CROSSMATCH)

## 2021-12-17 LAB — CBG MONITORING, ED
Glucose-Capillary: 64 mg/dL — ABNORMAL LOW (ref 70–99)
Glucose-Capillary: 101 mg/dL — ABNORMAL HIGH (ref 70–99)

## 2021-12-17 LAB — GLUCOSE, CAPILLARY: Glucose-Capillary: 103 mg/dL — ABNORMAL HIGH (ref 70–99)

## 2021-12-17 MED ORDER — IPRATROPIUM-ALBUTEROL 0.5-2.5 (3) MG/3ML IN SOLN
3.0000 mL | Freq: Three times a day (TID) | RESPIRATORY_TRACT | Status: DC
  Administered 2021-12-17: 3 mL via RESPIRATORY_TRACT
  Filled 2021-12-17: qty 3

## 2021-12-17 MED ORDER — LOSARTAN POTASSIUM 25 MG PO TABS
12.5000 mg | ORAL_TABLET | Freq: Every day | ORAL | Status: DC
  Administered 2021-12-19: 12.5 mg via ORAL
  Filled 2021-12-17 (×2): qty 1

## 2021-12-17 MED ORDER — DILTIAZEM HCL ER COATED BEADS 120 MG PO CP24
240.0000 mg | ORAL_CAPSULE | Freq: Every day | ORAL | Status: DC
  Administered 2021-12-19: 240 mg via ORAL
  Filled 2021-12-17 (×2): qty 2

## 2021-12-17 MED ORDER — ONDANSETRON HCL 4 MG/2ML IJ SOLN: 4.0000 mg | Freq: Four times a day (QID) | INTRAMUSCULAR | Status: DC | PRN

## 2021-12-17 MED ORDER — ACETAMINOPHEN 650 MG RE SUPP: 650.0000 mg | Freq: Four times a day (QID) | RECTAL | Status: DC | PRN

## 2021-12-17 MED ORDER — POLYETHYLENE GLYCOL 3350 17 G PO PACK: 17.0000 g | PACK | Freq: Every day | ORAL | Status: DC | PRN

## 2021-12-17 MED ORDER — FINERENONE 10 MG PO TABS: 10.0000 mg | ORAL_TABLET | Freq: Every day | ORAL | Status: DC

## 2021-12-17 MED ORDER — FUROSEMIDE 10 MG/ML IJ SOLN
40.0000 mg | Freq: Once | INTRAMUSCULAR | Status: AC
  Administered 2021-12-17: 40 mg via INTRAVENOUS
  Filled 2021-12-17: qty 4

## 2021-12-17 MED ORDER — METOPROLOL TARTRATE 50 MG PO TABS
50.0000 mg | ORAL_TABLET | Freq: Two times a day (BID) | ORAL | Status: DC
  Administered 2021-12-17 – 2021-12-19 (×3): 50 mg via ORAL
  Filled 2021-12-17 (×4): qty 1

## 2021-12-17 MED ORDER — MONTELUKAST SODIUM 10 MG PO TABS
10.0000 mg | ORAL_TABLET | Freq: Every day | ORAL | Status: DC
  Administered 2021-12-17 – 2021-12-18 (×2): 10 mg via ORAL
  Filled 2021-12-17 (×2): qty 1

## 2021-12-17 MED ORDER — ROSUVASTATIN CALCIUM 20 MG PO TABS
20.0000 mg | ORAL_TABLET | Freq: Every day | ORAL | Status: DC
  Administered 2021-12-19: 20 mg via ORAL
  Filled 2021-12-17 (×2): qty 1

## 2021-12-17 MED ORDER — IPRATROPIUM-ALBUTEROL 0.5-2.5 (3) MG/3ML IN SOLN: 3.0000 mL | RESPIRATORY_TRACT | Status: DC | PRN

## 2021-12-17 MED ORDER — ISOSORBIDE DINITRATE 20 MG PO TABS
10.0000 mg | ORAL_TABLET | Freq: Three times a day (TID) | ORAL | Status: DC
  Administered 2021-12-18 – 2021-12-19 (×3): 10 mg via ORAL
  Filled 2021-12-17 (×4): qty 1

## 2021-12-17 MED ORDER — ONDANSETRON HCL 4 MG PO TABS: 4.0000 mg | ORAL_TABLET | Freq: Four times a day (QID) | ORAL | Status: DC | PRN

## 2021-12-17 MED ORDER — GUAIFENESIN-DM 100-10 MG/5ML PO SYRP
10.0000 mL | ORAL_SOLUTION | Freq: Three times a day (TID) | ORAL | Status: AC
  Administered 2021-12-17 – 2021-12-18 (×2): 10 mL via ORAL
  Filled 2021-12-17 (×3): qty 10

## 2021-12-17 MED ORDER — ACETAMINOPHEN 325 MG PO TABS
650.0000 mg | ORAL_TABLET | Freq: Four times a day (QID) | ORAL | Status: DC | PRN
  Administered 2021-12-18 (×2): 650 mg via ORAL
  Filled 2021-12-17 (×2): qty 2

## 2021-12-17 MED ORDER — PANTOPRAZOLE SODIUM 40 MG IV SOLR
40.0000 mg | Freq: Once | INTRAVENOUS | Status: AC
  Administered 2021-12-17: 40 mg via INTRAVENOUS
  Filled 2021-12-17: qty 10

## 2021-12-17 MED ORDER — INSULIN ASPART 100 UNIT/ML IJ SOLN
0.0000 [IU] | Freq: Four times a day (QID) | INTRAMUSCULAR | Status: DC
  Administered 2021-12-18 (×3): 2 [IU] via SUBCUTANEOUS
  Administered 2021-12-19 (×3): 3 [IU] via SUBCUTANEOUS

## 2021-12-17 MED ORDER — CALCITRIOL 0.25 MCG PO CAPS
0.2500 ug | ORAL_CAPSULE | ORAL | Status: DC
  Administered 2021-12-19: 0.25 ug via ORAL
  Filled 2021-12-17: qty 1

## 2021-12-17 MED ORDER — IPRATROPIUM-ALBUTEROL 0.5-2.5 (3) MG/3ML IN SOLN: 3.0000 mL | Freq: Four times a day (QID) | RESPIRATORY_TRACT | Status: DC | PRN

## 2021-12-17 MED ORDER — SODIUM CHLORIDE 0.9 % IV SOLN
10.0000 mL/h | Freq: Once | INTRAVENOUS | Status: AC
  Administered 2021-12-17: 10 mL/h via INTRAVENOUS

## 2021-12-17 MED ORDER — PANTOPRAZOLE SODIUM 40 MG IV SOLR
40.0000 mg | Freq: Two times a day (BID) | INTRAVENOUS | Status: DC
  Administered 2021-12-18 (×2): 40 mg via INTRAVENOUS
  Filled 2021-12-17 (×2): qty 10

## 2021-12-17 NOTE — Assessment & Plan Note (Addendum)
Stable and compensated.  Last echo 2020 EF of 65%.  On Lasix and metolazone med list.  Patient does not know what medications she takes.  BNP elevated at 461, compared to 125 3 years ago. - Takes Lasix 20 mg twice daily alternating with 20 mg daily. -Continue home Lasix at 40 mg daily

## 2021-12-17 NOTE — Assessment & Plan Note (Signed)
Stable. -Resume diltiazem in a.m., Imdur, metoprolol, losartan

## 2021-12-17 NOTE — ED Triage Notes (Signed)
BIB EMS from [facility] for dizziness ongoing 1 week. Pt states she has had nasal congestion, and felt a little sick on the stomach earlier. Pt states when she gets a cold she has these type complaints. Pt finished Amoxicillin 500mg  TID 9/14 and mucus relief tabs 600mg  BID 9/16

## 2021-12-17 NOTE — ED Notes (Signed)
Pt reports periodic back pain and recently has been "itching all over"

## 2021-12-17 NOTE — ED Provider Notes (Signed)
[facility] Provider Note   CSN: [id number] Arrival date & time: 12/17/21  1349     History  Chief Complaint  Patient presents with   Dizziness    [name] is a 67 y.o. female presenting from her local nursing home, history significant for  hypertension, kidney disease with history of former dialysis but not currently, type 2 diabetes, history of CHF, recent diagnosis of carcinoid tumor of left lung presenting for evaluation of feeling dizzy for the past week, she reports feeling like she has a upper respiratory infection including nasal congestion, states she felt a little queasy in her stomach earlier but this has resolved.  She has had a nonproductive cough, denies fevers or chills.  She was placed on a course of amoxicillin for a presumptive sinus infection which she completed 6 days ago and was also on Mucus Relief tablets which did not improve her symptoms.  She describes generalized fatigue, feels lightheaded when she stands.  She denies falls, denies headache, vision changes, also denies spinning quality to the lightheadedness, no tinnitus or ear pain.  She also denies chest pain, shortness of breath, abdominal pain.  She has had increased episodes of stooling over the past 2 days but denies diarrhea, she does state her stools have been darker than normal.  She has also been taking Pepto-Bismol for the nausea.  The history is provided by the patient and medical records.       Home Medications Prior to Admission medications   Medication Sig Start Date End Date Taking? Authorizing Provider  acetaminophen (TYLENOL) 325 MG tablet Take 325 mg by mouth every 12 (twelve) hours as needed for moderate pain.    [provider]  albuterol (PROVENTIL HFA;VENTOLIN HFA) 108 (90 Base) MCG/ACT inhaler Inhale 2 puffs into the lungs every 4 (four) hours as needed for shortness of breath.    [provider]  apixaban (ELIQUIS) 5 MG TABS tablet Take 1  tablet (5 mg total) by mouth 2 (two) times daily. 06/09/18   Dessa Phi, DO  benzonatate (TESSALON) 100 MG capsule Take 100 mg by mouth 2 (two) times daily as needed for cough.    [provider]  betamethasone dipropionate (DIPROLENE) 0.05 % ointment Apply 1 application  topically 2 (two) times daily as needed (for rash under breasts). 08/04/19   [provider]  calcitRIOL (ROCALTROL) 0.25 MCG capsule Take 0.25 mcg by mouth every Monday, Wednesday, and Friday. 08/08/20   [provider]  cetirizine (ZYRTEC) 10 MG tablet Take 10 mg by mouth daily. 06/06/19   [provider]  Cholecalciferol (VITAMIN D3) 50 MCG (2000 UT) TABS TAKE 1 TABLET BY MOUTH ONCE DAILY. 01/02/21   Cassandria Anger, MD  clotrimazole (LOTRIMIN) 1 % cream Apply 1 application  topically 2 (two) times daily as needed (for rash under breasts).    [provider]  diltiazem (CARDIZEM CD) 240 MG 24 hr capsule Take 1 capsule (240 mg total) by mouth daily. 06/10/18   Dessa Phi, DO  DROPSAFE SAFETY PEN NEEDLES 31G X 6 MM MISC  10/03/20   [provider]  EASYMAX TEST test strip CHECK BLOOD SUGAR 4 TIMES A DAY. (BREAKFAST, LUNCH, SUPPER & BEDTIME) 12/15/21   Nida, Marella Chimes, MD  Finerenone (KERENDIA) 10 MG TABS Take 10 mg by mouth daily.    [provider]  fluticasone (FLONASE) 50 MCG/ACT nasal spray Place 2 sprays into both nostrils daily as needed for allergies.  [provider]  Fluticasone-Umeclidin-Vilant (TRELEGY ELLIPTA) 100-62.5-25 MCG/ACT AEPB Inhale 1 puff into the lungs daily.    [provider]  furosemide (LASIX) 40 MG tablet Take 40 mg by mouth See admin instructions. Take 40 mg twice daily, alternating with 40 mg daily every other day 04/20/19   [provider]  guaiFENesin (MUCINEX) 600 MG 12 hr tablet Take 1 tablet (600 mg total) by mouth 2 (two) times daily as needed. Patient not taking: Reported on 08/20/2021 02/20/20    Ahmed Prima, Fransisco Hertz, PA-C  HUMULIN R U-500 KWIKPEN 500 UNIT/ML KwikPen INJECT 40 UNITS SUBCUTANEOUSLY AT BREAKFAST; INJECT 30 UNITS AT LUNCH & SUPPER; WHEN GLUCOSE IS ABOVE 90 & EATING.(HOLD IF BS BELOW 70: CALL MD IF BS ABOVE 400 09/19/21   Nida, Marella Chimes, MD  ipratropium-albuterol (DUONEB) 0.5-2.5 (3) MG/3ML SOLN Take 3 mLs by nebulization every 6 (six) hours as needed (for cough).    [provider]  isosorbide dinitrate (ISORDIL) 10 MG tablet Take 1 tablet (10 mg total) by mouth 3 (three) times daily. 06/09/18   Dessa Phi, DO  Lancets 28G MISC Use to test BG qid. E11.65 10/07/21   Cassandria Anger, MD  loperamide (IMODIUM) 2 MG capsule Take 2-4 mg by mouth See admin instructions. Take 4 mg by mouth now: then 2 mg after each loose stool as needed, do not exceed 5 doses in 24 hours.    [provider]  losartan (COZAAR) 25 MG tablet Take 12.5 mg by mouth daily.    [provider]  metolazone (ZAROXOLYN) 5 MG tablet Take 5 mg by mouth in the morning.    [provider]  metoprolol tartrate (LOPRESSOR) 50 MG tablet Take 50 mg by mouth 2 (two) times daily. 05/08/20   [provider]  montelukast (SINGULAIR) 10 MG tablet Take 10 mg by mouth at bedtime.    [provider]  pantoprazole (PROTONIX) 40 MG tablet Take 1 tablet (40 mg total) by mouth daily. 10/16/21   Rourk, Cristopher Estimable, MD  polyethylene glycol powder (GLYCOLAX/MIRALAX) 17 GM/SCOOP powder Take 17 g by mouth daily as needed for mild constipation or moderate constipation.    [provider]  potassium chloride SA (KLOR-CON M) 20 MEQ tablet Take 20 mEq by mouth daily. 09/11/19   [provider]  rosuvastatin (CRESTOR) 20 MG tablet TAKE (1) TABLET BY MOUTH ONCE DAILY. 12/20/19   Cassandria Anger, MD  triamcinolone cream (KENALOG) 0.5 % Apply 1 application topically every 12 (twelve) hours as needed (applied to bilateral legs for allergic dermatitis).    [provider]  Vitamin D, Cholecalciferol, 25 MCG (1000 UT) TABS TAKE 1 TABLET BY MOUTH ONCE DAILY. 06/16/21   Cassandria Anger, MD      Allergies    Patient has no known allergies.    Review of Systems   Review of Systems  Constitutional:  Positive for fatigue. Negative for fever.  HENT:  Positive for congestion. Negative for sore throat.   Eyes: Negative.   Respiratory:  Negative for chest tightness and shortness of breath.   Cardiovascular:  Negative for chest pain.  Gastrointestinal:  Positive for nausea. Negative for abdominal pain, anal bleeding, blood in stool, constipation, diarrhea and vomiting.       Darker than normal stools  Genitourinary: Negative.   Musculoskeletal:  Negative for arthralgias, joint swelling and neck pain.  Skin: Negative.  Negative for rash and wound.  Neurological:  Positive for dizziness and light-headedness.  Negative for weakness, numbness and headaches.  Psychiatric/Behavioral: Negative.      Physical Exam Updated Vital Signs BP 112/68   Pulse 66   Temp 97.8 F (36.6 C) (Oral)   Resp 20   Ht 5\' 3"  (1.6 m)   SpO2 100%   BMI 45.67 kg/m  Physical Exam Vitals and nursing note reviewed. Exam conducted with a chaperone present.  Constitutional:      Appearance: She is well-developed.  HENT:     Head: Normocephalic and atraumatic.  Eyes:     Conjunctiva/sclera: Conjunctivae normal.  Cardiovascular:     Rate and Rhythm: Normal rate and regular rhythm.     Heart sounds: Normal heart sounds.  Pulmonary:     Effort: Pulmonary effort is normal.     Breath sounds: Normal breath sounds. No wheezing.  Abdominal:     General: Bowel sounds are normal.     Palpations: Abdomen is soft.     Tenderness: There is no abdominal tenderness. There is no guarding or rebound.  Genitourinary:    Rectum: Guaiac result positive. No tenderness.     Comments: Strongly Hemoccult positive, no frank blood, dark stool specimen. Musculoskeletal:         General: Normal range of motion.     Cervical back: Normal range of motion.  Skin:    General: Skin is warm and dry.  Neurological:     Mental Status: She is alert.     ED Results / Procedures / Treatments   Labs (all labs ordered are listed, but only abnormal results are displayed) Labs Reviewed  CBC - Abnormal; Notable for the following components:      Result Value   WBC 11.0 (*)    RBC 1.73 (*)    Hemoglobin 4.8 (*)    HCT 16.0 (*)    RDW 16.6 (*)    nRBC 0.7 (*)    All other components within normal limits  BASIC METABOLIC PANEL - Abnormal; Notable for the following components:   Sodium 133 (*)    Glucose, Bld 119 (*)    BUN 65 (*)    Creatinine, Ser 2.29 (*)    Calcium 8.8 (*)    GFR, Estimated 23 (*)    All other components within normal limits  BRAIN NATRIURETIC PEPTIDE - Abnormal; Notable for the following components:   B Natriuretic Peptide 461.0 (*)    All other components within normal limits  SARS CORONAVIRUS 2 BY RT PCR  POC OCCULT BLOOD, ED  TYPE AND SCREEN  PREPARE RBC (CROSSMATCH)    EKG None  Radiology DG Chest Portable 1 View  Result Date: 12/17/2021 CLINICAL DATA:  Cough, chest pain. EXAM: PORTABLE CHEST 1 VIEW COMPARISON:  May 07, 2020. FINDINGS: Stable cardiomegaly. The heart size and mediastinal contours are within normal limits. Both lungs are clear. The visualized skeletal structures are unremarkable. IMPRESSION: No active disease. Electronically Signed   By: Marijo Conception M.D.   On: 12/17/2021 15:38    Procedures Procedures    Medications Ordered in ED Medications  0.9 %  sodium chloride infusion (has no administration in time range)  pantoprazole (PROTONIX) injection 40 mg (40 mg Intravenous Given 12/17/21 1555)    ED Course/ Medical Decision Making/ A&P                           Medical Decision Making Patient presenting with a 1 week history of dizziness, felt to  be secondary to a URI by patient, not improved despite a  round of amoxicillin and taking Mucus Relief tablets.  She has had a mild cough, denies shortness of breath.  Differential diagnosis including sinus infection, bronchitis or pneumonia, COVID-19 which is currently being screened for, other broader differential including dehydration, new onset vertigo.  She denies headache or other focal weakness, doubt this represents a central cerebellar source of dizziness.  Has labs started to result, I was notified of his significant drop in her hemoglobin of 4.8, in comparison to hemoglobin of 11.14 months ago.  She underwent endoscopy to via Dr. [name] in July secondary to chronic anemia at which time she was found to have friable gastric mucosa without frank PUD.  Upon further questioning patient stated that she has had dark more frequent stools for the past several days, denies diarrhea.  Denies frank blood.  Also has had no abdominal pain.  She takes Tylenol only, no NSAIDs but is on Eliquis.  Also no aspirin use.  She does take Protonix 40 mg daily.  Bedside Hemoccult was performed, dark stool which is strongly Hemoccult positive.  Patient will require admission for GI bleed and significant anemia.  Type and screening ordered, will plan blood transfusion which patient is agreeable.  Amount and/or Complexity of Data Reviewed Labs: ordered.    Details: Additional abnormal labs including an elevation in her creatinine level.  For the past year this number has been in the 1.56-1.9 range, today she is 2.29.  She does have a bump in her BUN at 65 suggesting at least some degree of dehydration as well although this could also represent an upper GI bleed. Radiology: ordered. Discussion of management or test interpretation with external provider(s): Call placed to GI.  Discussed with Dr. [name] who will consult patient in the morning, in the interim has requested patient have clear liquid diet today, n.p.o. after midnight and to continue her PPI.  He is also suggested  perhaps just giving her 2 units of packed red cells and then repeat a hemoglobin prior to giving 3rd unit.  Call placed to the hospitalist service for admission.  Discussed with Dr. Denton Brick who accepts pt for admission.  Risk Decision regarding hospitalization.           Final Clinical Impression(s) / ED Diagnoses Final diagnoses:  Gastrointestinal hemorrhage, unspecified gastrointestinal hemorrhage type  Acute blood loss anemia    Rx / DC Orders ED Discharge Orders     None         Landis Martins 12/17/21 1753    Fredia Sorrow, MD 12/19/21 [phone]

## 2021-12-17 NOTE — ED Notes (Signed)
Pt's [employer] legal guardian is Doretha Imus and she may be reached at [phone]

## 2021-12-17 NOTE — H&P (Addendum)
History and Physical    [name] GDJ:[id number] DOB: Aug 21, 1955 DOA: 12/17/2021  PCP: Anne Meiers, MD   Patient coming from: Home  I have personally briefly reviewed patient's old medical records in [place]  Chief Complaint: Dizziness  HPI: [name] is a 66 y.o. female with medical history significant for COPD, diastolic CHF, diabetes mellitus, hypertension, CKD 3 fibrillation on anticoagulation. Patient presented to ED with complaints of dizziness of 1 week.  Dizziness is present with standing.  She denies difficulty breathing, no chest pain.  She reports over the past 2 days her stools have been darker than usual.  But she has not had any blood in stools or vomiting.  She has been compliant with her Eliquis.  She denies NSAID use. She has been having a productive cough over the past 2 weeks.  No difficulty breathing.  She reports she has been wheezing, but she intermittently has this.  No fevers no chills.  No leg swelling.  Saw outpatient provider she was prescribed amoxicillin 9/6 she reports compliance with it.  She has since had persistent cough.  ED Course: Blood pressure 1 12-1 37.  Stable vitals.  Hemoglobin down to 4.8.  Creatinine at baseline 2.29.  Chest x-ray clear. EDP provider talked to Dr. Gala [name], recommend admission, will see in consult.  N.p.o. after midnight, continue PPI.  Review of Systems: As per HPI all other systems reviewed and negative.  Past Medical History:  Diagnosis Date   Arthritis    Asthma    Atrial fibrillation ([place])    Carcinoid tumor determined by biopsy of lung 05/2018   Left lung   Chronic diastolic CHF (congestive heart failure) ([place])    CKD (chronic kidney disease), stage III ([place])    Diabetes mellitus type 2 in obese [facility])    Essential hypertension    Iron deficiency anemia 10/16/2010   Mental handicap 10/16/2010   Mild CAD 2013   Morbid obesity (HCC)    OSA (obstructive sleep apnea)     Past Surgical  History:  Procedure Laterality Date   ABDOMINAL HYSTERECTOMY     AV FISTULA PLACEMENT Right 06/06/2018   Procedure: ARTERIOVENOUS (AV) FISTULA CREATION;  Surgeon: Anne Heck, MD;  Location: [place];  Service: Vascular;  Laterality: Right;   BIOPSY  10/16/2021   Procedure: BIOPSY;  Surgeon: Anne Dolin, MD;  [facility];  Service: Endoscopy;;   BRONCHIAL DILITATION  12/13/2020   Procedure: BRONCHIAL DILITATION;  Surgeon: Anne Nash, DO;  Location: [place] [facility];  Service: Pulmonary;;   CESAREAN SECTION     CHOLECYSTECTOMY     COLONOSCOPY  08/2010   normal TI, sigmoid polyp (adenoma ). Next TCS due  08/2015,   COLONOSCOPY WITH PROPOFOL N/A 07/08/2020   Procedure: COLONOSCOPY WITH PROPOFOL;  Surgeon: Anne Dolin, MD;  [facility];  Service: Endoscopy;  Laterality: N/A;  AM (diabetic and facility patient)   ESOPHAGOGASTRODUODENOSCOPY  08/2010   antral and duodenal erosions s/p bx (chronic gastritis, no h.pylori, no celiac dz ), hiatal hernia   ESOPHAGOGASTRODUODENOSCOPY (EGD) WITH PROPOFOL N/A 10/16/2021   Procedure: ESOPHAGOGASTRODUODENOSCOPY (EGD) WITH PROPOFOL;  Surgeon: Anne Dolin, MD;  [facility];  Service: Endoscopy;  Laterality: N/A;  11:45am   FISTULA SUPERFICIALIZATION Right 08/25/2018   Procedure: FISTULA SUPERFICIALIZATION RIGHT ARM;  Surgeon: Anne Sandy, MD;  Location: [place];  Service: Cardiovascular;  Laterality: Right;   IR FLUORO GUIDE CV LINE  RIGHT  05/31/2018   IR US GUIDE VASC ACCESS RIGHT  05/31/2018   KNEE SURGERY     right knee @ 66 years of age   LEFT AND RIGHT HEART CATHETERIZATION WITH CORONARY ANGIOGRAM N/A 09/21/2011   Procedure: LEFT AND RIGHT HEART CATHETERIZATION WITH CORONARY ANGIOGRAM;  Surgeon: Anne Riddle, MD;  Location: [place] [facility];  Service: Cardiovascular;  Laterality: N/A;   POLYPECTOMY  07/08/2020   Procedure: POLYPECTOMY INTESTINAL;  Surgeon: Anne Dolin, MD;  [facility];   Service: Endoscopy;;   VIDEO BRONCHOSCOPY Left 12/13/2020   Procedure: VIDEO BRONCHOSCOPY WITHOUT FLUORO;  Surgeon: Anne Nash, DO;  Location: [place];  Service: Pulmonary;  Laterality: Left;  Cryotherapy     reports that she quit smoking about 25 years ago. Her smoking use included cigarettes. She smoked an average of 1.00 packs per day. She has never used smokeless tobacco. She reports that she does not drink alcohol and does not use drugs.  No Known Allergies  Family History  Problem Relation Age of Onset   Diabetes Mother    Hypertension Mother    Heart failure Mother    Hypertension Father    Atrial fibrillation Father    Hypertension Son    Kidney disease Other        son reportedly has had cyst on kidney and lung s/p [facility] at [facility]   Colon cancer Neg Hx    Prior to Admission medications   Medication Sig Start Date End Date Taking? Authorizing Provider  acetaminophen (TYLENOL) 325 MG tablet Take 325 mg by mouth every 12 (twelve) hours as needed for moderate pain.    [provider]  albuterol (PROVENTIL HFA;VENTOLIN HFA) 108 (90 Base) MCG/ACT inhaler Inhale 2 puffs into the lungs every 4 (four) hours as needed for shortness of breath.    [provider]  amoxicillin (AMOXIL) 500 MG capsule Take by mouth. 12/03/21   [provider]  apixaban (ELIQUIS) 5 MG TABS tablet Take 1 tablet (5 mg total) by mouth 2 (two) times daily. 06/09/18   Anne Phi, DO  benzonatate (TESSALON) 100 MG capsule Take 100 mg by mouth 2 (two) times daily as needed for cough.    [provider]  betamethasone dipropionate (DIPROLENE) 0.05 % ointment Apply 1 application  topically 2 (two) times daily as needed (for rash under breasts). 08/04/19   [provider]  calcitRIOL (ROCALTROL) 0.25 MCG capsule Take 0.25 mcg by mouth every Monday, Wednesday, and Friday. 08/08/20   [provider]  cetirizine (ZYRTEC) 10 MG tablet Take 10 mg by mouth  daily. 06/06/19   [provider]  Cholecalciferol (VITAMIN D3) 50 MCG (2000 UT) TABS TAKE 1 TABLET BY MOUTH ONCE DAILY. 01/02/21   Anne Anger, MD  clotrimazole (LOTRIMIN) 1 % cream Apply 1 application  topically 2 (two) times daily as needed (for rash under breasts).    [provider]  diltiazem (CARDIZEM CD) 240 MG 24 hr capsule Take 1 capsule (240 mg total) by mouth daily. 06/10/18   Anne Phi, DO  DROPSAFE SAFETY PEN NEEDLES 31G X 6 MM MISC  10/03/20   [provider]  EASYMAX TEST test strip CHECK BLOOD SUGAR 4 TIMES A DAY. (BREAKFAST, LUNCH, SUPPER & BEDTIME) 12/15/21   Nida, Marella Chimes, MD  Finerenone (KERENDIA) 10 MG TABS Take 10 mg by mouth daily.    [provider]  fluticasone (FLONASE) 50 MCG/ACT nasal spray Place 2 sprays into  both nostrils daily as needed for allergies.    [provider]  Fluticasone-Umeclidin-Vilant (TRELEGY ELLIPTA) 100-62.5-25 MCG/ACT AEPB Inhale 1 puff into the lungs daily.    [provider]  furosemide (LASIX) 20 MG tablet Take 20 mg by mouth 2 (two) times daily. 12/12/21   [provider]  furosemide (LASIX) 40 MG tablet Take 40 mg by mouth See admin instructions. Take 40 mg twice daily, alternating with 40 mg daily every other day 04/20/19   [provider]  GERI-TUSSIN DM 10-100 MG/5ML liquid Take by mouth. 12/16/21   [provider]  guaiFENesin (MUCINEX) 600 MG 12 hr tablet Take 1 tablet (600 mg total) by mouth 2 (two) times daily as needed. Patient not taking: Reported on 08/20/2021 02/20/20   Ahmed Prima, Fransisco Hertz, PA-C  HUMULIN R U-500 KWIKPEN 500 UNIT/ML KwikPen INJECT 40 UNITS SUBCUTANEOUSLY AT BREAKFAST; INJECT 30 UNITS AT LUNCH & SUPPER; WHEN GLUCOSE IS ABOVE 90 & EATING.(HOLD IF BS BELOW 70: CALL MD IF BS ABOVE 400 09/19/21   Nida, Marella Chimes, MD  hydrOXYzine (ATARAX) 25 MG tablet Take 25 mg by mouth 3 (three) times daily. 12/16/21   [provider]   ipratropium-albuterol (DUONEB) 0.5-2.5 (3) MG/3ML SOLN Take 3 mLs by nebulization every 6 (six) hours as needed (for cough).    [provider]  isosorbide dinitrate (ISORDIL) 10 MG tablet Take 1 tablet (10 mg total) by mouth 3 (three) times daily. 06/09/18   Anne Phi, DO  Lancets 28G MISC Use to test BG qid. E11.65 10/07/21   Anne Anger, MD  loperamide (IMODIUM) 2 MG capsule Take 2-4 mg by mouth See admin instructions. Take 4 mg by mouth now: then 2 mg after each loose stool as needed, do not exceed 5 doses in 24 hours.    [provider]  losartan (COZAAR) 25 MG tablet Take 12.5 mg by mouth daily.    [provider]  metolazone (ZAROXOLYN) 5 MG tablet Take 5 mg by mouth in the morning.    [provider]  metoprolol tartrate (LOPRESSOR) 50 MG tablet Take 50 mg by mouth 2 (two) times daily. 05/08/20   [provider]  montelukast (SINGULAIR) 10 MG tablet Take 10 mg by mouth at bedtime.    [provider]  pantoprazole (PROTONIX) 40 MG tablet Take 1 tablet (40 mg total) by mouth daily. 10/16/21   Rourk, Cristopher Estimable, MD  polyethylene glycol powder (GLYCOLAX/MIRALAX) 17 GM/SCOOP powder Take 17 g by mouth daily as needed for mild constipation or moderate constipation.    [provider]  potassium chloride SA (KLOR-CON M) 20 MEQ tablet Take 20 mEq by mouth daily. 09/11/19   [provider]  rosuvastatin (CRESTOR) 20 MG tablet TAKE (1) TABLET BY MOUTH ONCE DAILY. 12/20/19   Anne Anger, MD  triamcinolone cream (KENALOG) 0.5 % Apply 1 application topically every 12 (twelve) hours as needed (applied to bilateral legs for allergic dermatitis).    [provider]  Vitamin D, Cholecalciferol, 25 MCG (1000 UT) TABS TAKE 1 TABLET BY MOUTH ONCE DAILY. 06/16/21   Anne Anger, MD    Physical Exam: Vitals:   12/17/21 1402 12/17/21 1405 12/17/21 1500  BP: (!) 124/58  112/68  Pulse: 60  66  Resp: 20     Temp: 97.8 F (36.6 C)    TempSrc: Oral    SpO2: 100%  100%  Height:  5\' 3"  (1.6 m)     Constitutional: NAD, calm,  comfortable Vitals:   12/17/21 1402 12/17/21 1405 12/17/21 1500  BP: (!) 124/58  112/68  Pulse: 60  66  Resp: 20    Temp: 97.8 F (36.6 C)    TempSrc: Oral    SpO2: 100%  100%  Height:  5\' 3"  (1.6 m)    Eyes: PERRL, lids and conjunctivae normal ENMT: Mucous membranes are moist.  Neck: normal, supple, no masses, no thyromegaly Respiratory: Coughing significantly, past expiratory rhonchi, Normal respiratory effort. No accessory muscle use.  Cardiovascular: Regular rate and rhythm, no murmurs / rubs / gallops. No extremity edema.  Extremities warm. Abdomen: no tenderness, no masses palpated. No hepatosplenomegaly. Bowel sounds positive.  Musculoskeletal: no clubbing / cyanosis. No joint deformity upper and lower extremities.  Skin: no rashes, lesions, ulcers. No induration Neurologic: Speech fluent without evidence of aphasia, no facial asymmetry, moving all extremities spontaneously  Psychiatric: Normal judgment and insight. Alert and oriented x 3. Normal mood.   Labs on Admission: I have personally reviewed following labs and imaging studies  CBC: Recent Labs  Lab 12/17/21 1454  WBC 11.0*  HGB 4.8*  HCT 16.0*  MCV 92.5  PLT 607   Basic Metabolic Panel: Recent Labs  Lab 12/17/21 1454  NA 133*  K 4.4  CL 98  CO2 26  GLUCOSE 119*  BUN 65*  CREATININE 2.29*  CALCIUM 8.8*   Radiological Exams on Admission: DG Chest Portable 1 View  Result Date: 12/17/2021 CLINICAL DATA:  Cough, chest pain. EXAM: PORTABLE CHEST 1 VIEW COMPARISON:  May 07, 2020. FINDINGS: Stable cardiomegaly. The heart size and mediastinal contours are within normal limits. Both lungs are clear. The visualized skeletal structures are unremarkable. IMPRESSION: No active disease. Electronically Signed   By: Marijo Conception M.D.   On: 12/17/2021 15:38    EKG: None    Assessment/Plan Principal Problem:   Acute on chronic anemia Active Problems:   Iron deficiency anemia   Essential hypertension, benign   Type 2 diabetes mellitus with stage 4 chronic kidney disease, with long-term current use of insulin (HCC)   Chronic diastolic HF (heart failure) (HCC)   Assessment and Plan: * Acute on chronic anemia With acute symptomatic anemia.  Creatinine 4.8, down from baseline 9-11 over the past year.  She reports some melena over the past few days.  No hematemesis no hematochezia.  Denies NSAID use.  On Eliquis and compliant. - Recent EGD 10/16/2021- Markedly abnormal stomach of uncertain significance. She is likely iron deficient due to GI (gastric) blood loss. Capsule not indicated at this time. - Normal duodenal bulb and second portion of the duodenum -  EDP talked to Dr. Gala [name], recommend admission, will see in consult.  N.p.o. after midnight, continue PPI. -Transfuse 2 units now -IV Lasix 40 mg x 1 with transfusion, resume home Lasix in a.m. -Trend hemoglobin -IV Protonix 40 twice daily -Hold further Eliquis -N.p.o. midnight  COPD (chronic obstructive pulmonary disease) (HCC) Increased cough over the past 2 weeks, stable dyspnea, sparse wheezing.  O2 sats 98-100% on room air.  Chest x-ray clear.  Was on amoxicillin for upper respiratory tract infection started 9/6. -DuoNebs scheduled x2, continue as needed -Continue home bronchodilator regimen -Follow-up COVID test - Mucolytics scheduled  Chronic diastolic HF (heart failure) (HCC) Stable and compensated.  Last echo 2020 EF of 65%.  On Lasix and metolazone med list.  Patient does not know what medications she takes.  BNP elevated at 461, compared to 125 3 years ago. - Takes  Lasix 20 mg twice daily alternating with 20 mg daily. -Continue home Lasix at 40 mg daily  Type 2 diabetes mellitus with stage 4 chronic kidney disease, with long-term current use of insulin (HCC) Glucose 119.  Creatinine 2.29,  close to baseline which is 1.6-2. - HgbA1c - SSI- M -Hold home Humulin 30- 40 units 3 times daily - resume kerendia  Essential hypertension, benign Stable. -Resume diltiazem in a.m., Imdur, metoprolol, losartan   DVT prophylaxis: SCDS Code Status: Full Family Communication: None at bedside Disposition Plan: ~ 2 days Consults called: GI Admission status:  Obs tele   Author: Bethena Roys, MD 12/17/2021 7:31 PM  For on call review [email or web address].

## 2021-12-17 NOTE — ED Notes (Signed)
  Patient stated that she felt like her CBG was low.  Checked CBG and it was 64.  Patient asymptomatic and able to follow commands.  Given 12 oz orange juice and will recheck in 15-20 minutes.

## 2021-12-17 NOTE — Assessment & Plan Note (Addendum)
Glucose 119.  Creatinine 2.29, close to baseline which is 1.6-2. - HgbA1c - SSI- M -Hold home Humulin 30- 40 units 3 times daily - resume [place]

## 2021-12-17 NOTE — Assessment & Plan Note (Signed)
Increased cough over the past 2 weeks, stable dyspnea, sparse wheezing.  O2 sats 98-100% on room air.  Chest x-ray clear.  Was on amoxicillin for upper respiratory tract infection started 9/6. -DuoNebs scheduled x2, continue as needed -Continue home bronchodilator regimen -Follow-up COVID test - Mucolytics scheduled

## 2021-12-17 NOTE — Assessment & Plan Note (Addendum)
With acute symptomatic anemia.  Creatinine 4.8, down from baseline 9-11 over the past year.  She reports some melena over the past few days.  No hematemesis no hematochezia.  Denies NSAID use.  On Eliquis and compliant. - Recent EGD 10/16/2021- Markedly abnormal stomach of uncertain significance. She is likely iron deficient due to GI (gastric) blood loss. Capsule not indicated at this time. - Normal duodenal bulb and second portion of the duodenum -  EDP talked to Dr. [name], recommend admission, will see in consult.  N.p.o. after midnight, continue PPI. -Transfuse 2 units now -IV Lasix 40 mg x 1 with transfusion, resume home Lasix in a.m. -Trend hemoglobin -IV Protonix 40 twice daily -Hold further Eliquis -N.p.o. midnight

## 2021-12-18 ENCOUNTER — Encounter (HOSPITAL_COMMUNITY)
Admission: EM | Disposition: A | Discharge status: Home / Self Care | Payer: Self-pay | Source: Skilled Nursing Facility | Attending: Family Medicine

## 2021-12-18 ENCOUNTER — Encounter (HOSPITAL_COMMUNITY): Payer: Self-pay | Admitting: Internal Medicine

## 2021-12-18 ENCOUNTER — Observation Stay (HOSPITAL_COMMUNITY): Payer: Medicare Other | Admitting: Anesthesiology

## 2021-12-18 ENCOUNTER — Other Ambulatory Visit: Payer: Self-pay

## 2021-12-18 DIAGNOSIS — Z79899 Other long term (current) drug therapy: Secondary | ICD-10-CM

## 2021-12-18 DIAGNOSIS — D62 Acute posthemorrhagic anemia: Secondary | ICD-10-CM | POA: Diagnosis present

## 2021-12-18 DIAGNOSIS — Z20822 Contact with and (suspected) exposure to covid-19: Secondary | ICD-10-CM | POA: Diagnosis present

## 2021-12-18 DIAGNOSIS — I4891 Unspecified atrial fibrillation: Secondary | ICD-10-CM | POA: Diagnosis present

## 2021-12-18 DIAGNOSIS — G4733 Obstructive sleep apnea (adult) (pediatric): Secondary | ICD-10-CM | POA: Diagnosis present

## 2021-12-18 DIAGNOSIS — K31811 Angiodysplasia of stomach and duodenum with bleeding: Secondary | ICD-10-CM

## 2021-12-18 DIAGNOSIS — I2510 Atherosclerotic heart disease of native coronary artery without angina pectoris: Secondary | ICD-10-CM

## 2021-12-18 DIAGNOSIS — N184 Chronic kidney disease, stage 4 (severe): Secondary | ICD-10-CM

## 2021-12-18 DIAGNOSIS — K922 Gastrointestinal hemorrhage, unspecified: Secondary | ICD-10-CM

## 2021-12-18 DIAGNOSIS — K7460 Unspecified cirrhosis of liver: Secondary | ICD-10-CM | POA: Diagnosis present

## 2021-12-18 DIAGNOSIS — Z87891 Personal history of nicotine dependence: Secondary | ICD-10-CM | POA: Diagnosis not present

## 2021-12-18 DIAGNOSIS — Z794 Long term (current) use of insulin: Secondary | ICD-10-CM | POA: Diagnosis not present

## 2021-12-18 DIAGNOSIS — I130 Hypertensive heart and chronic kidney disease with heart failure and stage 1 through stage 4 chronic kidney disease, or unspecified chronic kidney disease: Secondary | ICD-10-CM

## 2021-12-18 DIAGNOSIS — Z90710 Acquired absence of both cervix and uterus: Secondary | ICD-10-CM | POA: Diagnosis not present

## 2021-12-18 DIAGNOSIS — Z86010 Personal history of colonic polyps: Secondary | ICD-10-CM | POA: Diagnosis not present

## 2021-12-18 DIAGNOSIS — J449 Chronic obstructive pulmonary disease, unspecified: Secondary | ICD-10-CM | POA: Diagnosis present

## 2021-12-18 DIAGNOSIS — D649 Anemia, unspecified: Secondary | ICD-10-CM | POA: Diagnosis not present

## 2021-12-18 DIAGNOSIS — I5032 Chronic diastolic (congestive) heart failure: Secondary | ICD-10-CM | POA: Diagnosis present

## 2021-12-18 DIAGNOSIS — Z833 Family history of diabetes mellitus: Secondary | ICD-10-CM | POA: Diagnosis not present

## 2021-12-18 DIAGNOSIS — Z9049 Acquired absence of other specified parts of digestive tract: Secondary | ICD-10-CM | POA: Diagnosis not present

## 2021-12-18 DIAGNOSIS — E1165 Type 2 diabetes mellitus with hyperglycemia: Secondary | ICD-10-CM | POA: Diagnosis not present

## 2021-12-18 DIAGNOSIS — Z6841 Body Mass Index (BMI) 40.0 and over, adult: Secondary | ICD-10-CM | POA: Diagnosis not present

## 2021-12-18 DIAGNOSIS — Z7901 Long term (current) use of anticoagulants: Secondary | ICD-10-CM | POA: Diagnosis not present

## 2021-12-18 DIAGNOSIS — Z8249 Family history of ischemic heart disease and other diseases of the circulatory system: Secondary | ICD-10-CM | POA: Diagnosis not present

## 2021-12-18 DIAGNOSIS — E1122 Type 2 diabetes mellitus with diabetic chronic kidney disease: Secondary | ICD-10-CM | POA: Diagnosis present

## 2021-12-18 HISTORY — PX: ESOPHAGOGASTRODUODENOSCOPY (EGD) WITH PROPOFOL: SHX5813

## 2021-12-18 HISTORY — PX: HOT HEMOSTASIS: SHX5433

## 2021-12-18 LAB — BASIC METABOLIC PANEL
Anion gap: 12 (ref 5–15)
BUN: 60 mg/dL — ABNORMAL HIGH (ref 8–23)
CO2: 26 mmol/L (ref 22–32)
Calcium: 9.3 mg/dL (ref 8.9–10.3)
Chloride: 98 mmol/L (ref 98–111)
Creatinine, Ser: 2.21 mg/dL — ABNORMAL HIGH (ref 0.44–1.00)
GFR, Estimated: 24 mL/min — ABNORMAL LOW (ref >60)
Glucose, Bld: 100 mg/dL — ABNORMAL HIGH (ref 70–99)
Potassium: 3.5 mmol/L (ref 3.5–5.1)
Sodium: 136 mmol/L (ref 135–145)

## 2021-12-18 LAB — PREPARE RBC (CROSSMATCH)

## 2021-12-18 LAB — CBC
HCT: 23.4 % — ABNORMAL LOW (ref 36.0–46.0)
Hemoglobin: 7.4 g/dL — ABNORMAL LOW (ref 12.0–15.0)
MCH: 28.5 pg (ref 26.0–34.0)
MCHC: 31.6 g/dL (ref 30.0–36.0)
MCV: 90 fL (ref 80.0–100.0)
Platelets: 276 10*3/uL (ref 150–400)
RBC: 2.6 MIL/uL — ABNORMAL LOW (ref 3.87–5.11)
RDW: 15.3 % (ref 11.5–15.5)
WBC: 9.6 10*3/uL (ref 4.0–10.5)
nRBC: 0.5 % — ABNORMAL HIGH (ref 0.0–0.2)

## 2021-12-18 LAB — HEMOGLOBIN AND HEMATOCRIT, BLOOD
HCT: 23.8 % — ABNORMAL LOW (ref 36.0–46.0)
Hemoglobin: 7.6 g/dL — ABNORMAL LOW (ref 12.0–15.0)

## 2021-12-18 LAB — GLUCOSE, CAPILLARY
Glucose-Capillary: 121 mg/dL — ABNORMAL HIGH (ref 70–99)
Glucose-Capillary: 124 mg/dL — ABNORMAL HIGH (ref 70–99)
Glucose-Capillary: 129 mg/dL — ABNORMAL HIGH (ref 70–99)

## 2021-12-18 LAB — HIV ANTIBODY (ROUTINE TESTING W REFLEX): HIV Screen 4th Generation wRfx: NONREACTIVE

## 2021-12-18 SURGERY — ESOPHAGOGASTRODUODENOSCOPY (EGD) WITH PROPOFOL
Anesthesia: General

## 2021-12-18 MED ORDER — LACTATED RINGERS IV SOLN: INTRAVENOUS | Status: DC | PRN

## 2021-12-18 MED ORDER — LIDOCAINE HCL (CARDIAC) PF 100 MG/5ML IV SOSY
PREFILLED_SYRINGE | INTRAVENOUS | Status: DC | PRN
  Administered 2021-12-18: 60 mg via INTRATRACHEAL

## 2021-12-18 MED ORDER — PROPOFOL 10 MG/ML IV BOLUS
INTRAVENOUS | Status: DC | PRN
  Administered 2021-12-18: 40 mg via INTRAVENOUS
  Administered 2021-12-18: 100 mg via INTRAVENOUS
  Administered 2021-12-18: 20 mg via INTRAVENOUS

## 2021-12-18 MED ORDER — CHLORHEXIDINE GLUCONATE CLOTH 2 % EX PADS
6.0000 | MEDICATED_PAD | Freq: Every day | CUTANEOUS | Status: DC
  Administered 2021-12-18 – 2021-12-19 (×2): 6 via TOPICAL

## 2021-12-18 MED ORDER — PANTOPRAZOLE SODIUM 40 MG IV SOLR
40.0000 mg | Freq: Two times a day (BID) | INTRAVENOUS | Status: DC
  Administered 2021-12-18 – 2021-12-19 (×2): 40 mg via INTRAVENOUS
  Filled 2021-12-18 (×3): qty 10

## 2021-12-18 MED ORDER — FUROSEMIDE 10 MG/ML IJ SOLN: 20.0000 mg | Freq: Once | INTRAMUSCULAR | Status: DC

## 2021-12-18 MED ORDER — LACTATED RINGERS IV SOLN: INTRAVENOUS | Status: DC

## 2021-12-18 MED ORDER — SODIUM CHLORIDE 0.9% IV SOLUTION: Freq: Once | INTRAVENOUS | Status: AC

## 2021-12-18 MED ORDER — FERROUS SULFATE 325 (65 FE) MG PO TABS
325.0000 mg | ORAL_TABLET | Freq: Every day | ORAL | Status: DC
  Administered 2021-12-19: 325 mg via ORAL
  Filled 2021-12-18 (×2): qty 1

## 2021-12-18 MED ORDER — FUROSEMIDE 10 MG/ML IJ SOLN
20.0000 mg | Freq: Once | INTRAMUSCULAR | Status: AC
  Administered 2021-12-18: 20 mg via INTRAVENOUS
  Filled 2021-12-18: qty 2

## 2021-12-18 NOTE — Interval H&P Note (Signed)
History and Physical Interval Note:  12/18/2021 12:00 PM  [name]  has presented today for surgery, with the diagnosis of acute on chronic anemia, melena.  The various methods of treatment have been discussed with the patient and family. After consideration of risks, benefits and other options for treatment, the patient has consented to  Procedure(s): ESOPHAGOGASTRODUODENOSCOPY (EGD) WITH PROPOFOL (N/A) as a surgical intervention.  The patient's history has been reviewed, patient examined, no change in status, stable for surgery.  I have reviewed the patient's chart and labs.  Questions were answered to the patient's satisfaction.     [name]

## 2021-12-18 NOTE — Transfer of Care (Signed)
Immediate Anesthesia Transfer of Care Note  Patient: [name]  Procedure(s) Performed: ESOPHAGOGASTRODUODENOSCOPY (EGD) WITH PROPOFOL HOT HEMOSTASIS (ARGON PLASMA COAGULATION/BICAP)  Patient Location: PACU  Anesthesia Type:General  Level of Consciousness: awake  Airway & Oxygen Therapy: Patient Spontanous Breathing  Post-op Assessment: Report given to RN and Post -op Vital signs reviewed and stable  Post vital signs: Reviewed and stable  Last Vitals:  Vitals Value Taken Time  BP 100/60   Temp 36   Pulse 68 12/18/21 1332  Resp 26 12/18/21 1332  SpO2 98 % 12/18/21 1332  Vitals shown include unvalidated device data.  Last Pain:  Vitals:   12/18/21 1320  TempSrc:   PainSc: 6       Patients Stated Pain Goal: 5 (96/72/89 7915)  Complications: No notable events documented.

## 2021-12-18 NOTE — Progress Notes (Signed)
  Transition of Care [facility]) Screening Note   Patient Details  Name: [name] Date of Birth: 01-03-1956   Transition of Care [facility]) CM/SW Contact:    Ihor Gully, LCSW Phone Number: 12/18/2021, 10:19 AM    Transition of Care Department [facility]) has reviewed patient and no TOC needs have been identified at this time. We will continue to monitor patient advancement through interdisciplinary progression rounds. If new patient transition needs arise, please place a TOC consult.

## 2021-12-18 NOTE — Op Note (Signed)
[facility] Patient Name: [name] Procedure Date: 12/18/2021 1:11 PM MRN: [id number] Date of Birth: October 16, 1955 Attending MD: Elon Alas. Abbey Chatters DO CSN: [id number] Age: 66 Admit Type: Inpatient Procedure:                Upper GI endoscopy Indications:              Acute post hemorrhagic anemia, Melena Providers:                Elon Alas. Abbey Chatters, DO, Lambert Mody, Angela A.                            Theda Sers RN, RN Referring MD:              Medicines:                See the Anesthesia note for documentation of the                            administered medications Complications:            No immediate complications. Estimated Blood Loss:     Estimated blood loss was minimal. Procedure:                Pre-Anesthesia Assessment:                           - The anesthesia plan was to use monitored                            anesthesia care (MAC).                           After obtaining informed consent, the endoscope was                            passed under direct vision. Throughout the                            procedure, the patient's blood pressure, pulse, and                            oxygen saturations were monitored continuously. The                            [id number]-[id number] ([id number]) scope was introduced through the                            mouth, and advanced to the second part of duodenum.                            The upper GI endoscopy was accomplished without                            difficulty. The patient tolerated the procedure                            well. Scope  In: 1:20:37 PM Scope Out: 1:27:44 PM Total Procedure Duration: 0 hours 7 minutes 7 seconds  Findings:      The Z-line was regular and was found 38 cm from the incisors.      There is no endoscopic evidence of bleeding, areas of erosion,       esophagitis, ulcerations or varices in the entire esophagus.      Mild gastric antral vascular ectasia with bleeding was present in the        gastric antrum. Coagulation for hemostasis using argon plasma was       successful. Coagulation was then used to seal remaining portions of GAVE.      The duodenal bulb, first portion of the duodenum and second portion of       the duodenum were normal. Impression:               - Z-line regular, 38 cm from the incisors.                           - Gastric antral vascular ectasia with bleeding.                            Treated with argon plasma coagulation (APC).                           - Normal duodenal bulb, first portion of the                            duodenum and second portion of the duodenum.                           - No specimens collected. Moderate Sedation:      Per Anesthesia Care Recommendation:           - Return patient to hospital ward for ongoing care.                           - Soft diet.                           - IV PPI BID while inpatient, switch to PO BID upon                            DC.                           - Continue Iron supplementation Procedure Code(s):        --- Professional ---                           [phone], Esophagogastroduodenoscopy, flexible,                            transoral; with ablation of tumor(s), polyp(s), or                            other lesion(s) (includes pre- and post-dilation  and guide wire passage, when performed)                           43255, 59, Esophagogastroduodenoscopy, flexible,                            transoral; with control of bleeding, any method Diagnosis Code(s):        --- Professional ---                           K31.811, Angiodysplasia of stomach and duodenum                            with bleeding                           D62, Acute posthemorrhagic anemia                           K92.1, Melena (includes Hematochezia) CPT copyright 2019 American Medical Association. All rights reserved. The codes documented in this report are preliminary and upon coder review may   be revised to meet current compliance requirements. Elon Alas. Abbey Chatters, DO [place] Abbey Chatters, DO 12/18/2021 1:34:22 PM This report has been signed electronically. Number of Addenda: 0

## 2021-12-18 NOTE — Anesthesia Preprocedure Evaluation (Signed)
Anesthesia Evaluation  Patient identified by MRN, date of birth, ID band Patient awake    Reviewed: Allergy & Precautions, H&P , NPO status , Patient's Chart, lab work & pertinent test results, reviewed documented beta blocker date and time   Airway Mallampati: II  TM Distance: >3 FB Neck ROM: full    Dental no notable dental hx.    Pulmonary asthma , sleep apnea , COPD, former smoker,    Pulmonary exam normal breath sounds clear to auscultation       Cardiovascular Exercise Tolerance: Good hypertension, + CAD and +CHF   Rhythm:regular Rate:Normal     Neuro/Psych negative neurological ROS  negative psych ROS   GI/Hepatic negative GI ROS, Neg liver ROS,   Endo/Other  negative endocrine ROSdiabetes  Renal/GU CRF and ARFRenal disease  negative genitourinary   Musculoskeletal   Abdominal   Peds  Hematology  (+) Blood dyscrasia, anemia ,   Anesthesia Other Findings   Reproductive/Obstetrics negative OB ROS                             Anesthesia Physical Anesthesia Plan  ASA: 4 and emergent  Anesthesia Plan: General   Post-op Pain Management:    Induction:   PONV Risk Score and Plan: Propofol infusion  Airway Management Planned:   Additional Equipment:   Intra-op Plan:   Post-operative Plan:   Informed Consent: I have reviewed the patients History and Physical, chart, labs and discussed the procedure including the risks, benefits and alternatives for the proposed anesthesia with the patient or authorized representative who has indicated his/her understanding and acceptance.     Dental Advisory Given  Plan Discussed with: CRNA  Anesthesia Plan Comments:         Anesthesia Quick Evaluation

## 2021-12-18 NOTE — Progress Notes (Signed)
PROGRESS NOTE     [name], is a 66 y.o. female, DOB - 1955-04-29, VQM:[id number]  Admit date - 12/17/2021   Admitting Physician Anne Mezo Denton Brick, MD  Outpatient Primary MD for the patient is [name], Normajean Baxter, MD  LOS - 0  Chief Complaint  Patient presents with   Dizziness      Brief Narrative:  66 y.o. female with medical history significant for COPD, diastolic CHF, diabetes mellitus, hypertension, CKD 3 fibrillation on anticoagulation admitted on 12/17/2021 with acute on chronic symptomatic anemia with hemoglobin down to 4.8    -Assessment and Plan: * Acute on chronic symptomatic anemia --Repeat EGD on 12/18/2021 suggest early GAVE, 1 portion was actively oozing (Gastric antral vascular ectasia with bleeding, Treated with argon plasma coagulation (APC).).   -she has a CT chest from 2020 which did mention cirrhosis.   -GI recommends holding Eliquis through 12/20/2021 -Continue IV Protonix every 12 hours Hgb up to 7.4 from 4.8 after transfusion of 2 units of PRBC -We will transfuse an additional unit of PRBC to keep hemoglobin above 8 given underlying cardiac disease  COPD (chronic obstructive pulmonary disease) ([place]) -Chest x-ray without definite pneumonia -Continue bronchodilators and mucolytics  Chronic diastolic HF (heart failure) (HCC) Stable and compensated.  Last echo 2020 EF of 65%.  On Lasix and metolazone med list.  Patient does not know what medications she takes.  BNP elevated at 461, compared to 125 from 3 years ago. -Continue  Lasix at 40 mg daily  CKD stage - IV   -Renal function appears close to baseline at this time - renally adjust medications, avoid nephrotoxic agents / dehydration  / hypotension  Type 2 diabetes mellitus with stage 4 chronic kidney disease, with long-term current use of insulin (HCC) .--A1c is 5.1 reflecting excellent diabetic control PTA -Hold home Humulin 30- 40 units 3 times daily - resume kerendia Use Novolog/Humalog  Sliding scale insulin with Accu-Cheks/Fingersticks as ordered   Essential hypertension, benign Stable. -Resume diltiazem  Imdur, metoprolol, losartan  Morbid Obesity- -Low calorie diet, portion control and increase physical activity discussed with patient -Body mass index is 47.58 kg/m.   Disposition/Need for in-Hospital Stay- patient unable to be discharged at this time due to --acute GI bleed requiring transfusion,*  Status is: Inpatient   Disposition: The patient is from: Home              Anticipated d/c is to: Home              Anticipated d/c date is: 1 day              Patient currently is not medically stable to d/c. Barriers: Not Clinically Stable-   Code Status :  -  Code Status: Full Code   Family Communication:    NA (patient is alert, awake and coherent)   DVT Prophylaxis  :   - SCDs   SCDs Start: 12/17/21 2209   Lab Results  Component Value Date   PLT 276 12/18/2021   Inpatient Medications  Scheduled Meds:  [START ON 12/19/2021] calcitRIOL  0.25 mcg Oral Q M,W,F   Chlorhexidine Gluconate Cloth  6 each Topical Daily   diltiazem  240 mg Oral Daily   [START ON 12/19/2021] ferrous sulfate  325 mg Oral Q breakfast   Finerenone  10 mg Oral Daily   guaiFENesin-dextromethorphan  10 mL Oral Q8H   insulin aspart  0-15 Units Subcutaneous Q6H   isosorbide dinitrate  10 mg Oral TID  losartan  12.5 mg Oral Daily   metoprolol tartrate  50 mg Oral BID   montelukast  10 mg Oral QHS   pantoprazole (PROTONIX) IV  40 mg Intravenous Q12H   rosuvastatin  20 mg Oral Daily   Continuous Infusions: PRN Meds:.acetaminophen **OR** acetaminophen, ipratropium-albuterol, ipratropium-albuterol, ondansetron **OR** ondansetron (ZOFRAN) IV, polyethylene glycol   Anti-infectives (From admission, onward)    None         Subjective: [name] today has no fevers, no emesis,  No chest pain,   No dyspnea on exertion -Tolerating diet okay after EGD  Objective: Vitals:    12/18/21 1000 12/18/21 1212 12/18/21 1332 12/18/21 1430  BP: 110/65 121/64 104/60 111/66  Pulse: 80 98 93 80  Resp: 18 20 16 18   Temp: 98.1 F (36.7 C) 97.7 F (36.5 C) 97.8 F (36.6 C) 97.6 F (36.4 C)  TempSrc: Oral Oral  Oral  SpO2: 100% 99% 97% 100%  Weight:  118 kg    Height:  5\' 2"  (1.575 m)      Intake/Output Summary (Last 24 hours) at 12/18/2021 1801 Last data filed at 12/18/2021 1430 Gross per 24 hour  Intake 671 ml  Output 4100 ml  Net -3429 ml   Filed Weights   12/17/21 2141 12/18/21 1212  Weight: 118.6 kg 118 kg    Physical Exam  Gen:- Awake Alert,  in no apparent distress  HEENT:- [place].AT, No sclera icterus Neck-Supple Neck,No JVD,.  Lungs-  CTAB , fair symmetrical air movement CV- S1, S2 normal, regular  Abd-  +ve B.Sounds, Abd Soft, No significant tenderness, increased   truncal adiposity noted Extremity/Skin:- No  edema, pedal pulses present  Psych-affect is appropriate, oriented x3 Neuro-no new focal deficits, no tremors  Data Reviewed: I have personally reviewed following labs and imaging studies  CBC: Recent Labs  Lab 12/17/21 1454 12/18/21 0011  WBC 11.0* 9.6  HGB 4.8* 7.4*  7.6*  HCT 16.0* 23.4*  23.8*  MCV 92.5 90.0  PLT 310 121   Basic Metabolic Panel: Recent Labs  Lab 12/17/21 1454 12/18/21 0011  NA 133* 136  K 4.4 3.5  CL 98 98  CO2 26 26  GLUCOSE 119* 100*  BUN 65* 60*  CREATININE 2.29* 2.21*  CALCIUM 8.8* 9.3   GFR: Estimated Creatinine Clearance: 30.6 mL/min (A) (by C-G formula based on SCr of 2.21 mg/dL (H)). Liver Function Tests: No results for input(s): "AST", "ALT", "ALKPHOS", "BILITOT", "PROT", "ALBUMIN" in the last 168 hours. Cardiac Enzymes: No results for input(s): "CKTOTAL", "CKMB", "CKMBINDEX", "TROPONINI" in the last 168 hours. BNP (last 3 results) No results for input(s): "PROBNP" in the last 8760 hours. HbA1C: Recent Labs    12/17/21 1454  HGBA1C 5.1   Sepsis  Labs: @LABRCNTIP (procalcitonin:4,lacticidven:4) ) Recent Results (from the past 240 hour(s))  SARS Coronavirus 2 by RT PCR (hospital order, performed in [facility] hospital lab) *cepheid single result test* Anterior Nasal Swab     Status: None   Collection Time: 12/17/21  6:40 PM   Specimen: Anterior Nasal Swab  Result Value Ref Range Status   SARS Coronavirus 2 by RT PCR NEGATIVE NEGATIVE Final    Comment: (NOTE) SARS-CoV-2 target nucleic acids are NOT DETECTED.  The SARS-CoV-2 RNA is generally detectable in upper and lower respiratory specimens during the acute phase of infection. The lowest concentration of SARS-CoV-2 viral copies this assay can detect is 250 copies / mL. A negative result does not preclude SARS-CoV-2 infection and should not be used  as the sole basis for treatment or other patient management decisions.  A negative result may occur with improper specimen collection / handling, submission of specimen other than nasopharyngeal swab, presence of viral mutation(s) within the areas targeted by this assay, and inadequate number of viral copies (<250 copies / mL). A negative result must be combined with clinical observations, patient history, and epidemiological information.  Fact Sheet for Patients:   https://www.patel.info/  Fact Sheet for Healthcare Providers: https://hall.com/  This test is not yet approved or  cleared by the [place] FDA and has been authorized for detection and/or diagnosis of SARS-CoV-2 by FDA under an Emergency Use Authorization (EUA).  This EUA will remain in effect (meaning this test can be used) for the duration of the COVID-19 declaration under Section 564(b)(1) of the Act, 21 U.S.C. section 360bbb-3(b)(1), unless the authorization is terminated or revoked sooner.  Performed at [facility], [address], [place], [place] [address]       Radiology Studies: DG Chest Portable 1  View  Result Date: 12/17/2021 CLINICAL DATA:  Cough, chest pain. EXAM: PORTABLE CHEST 1 VIEW COMPARISON:  May 07, 2020. FINDINGS: Stable cardiomegaly. The heart size and mediastinal contours are within normal limits. Both lungs are clear. The visualized skeletal structures are unremarkable. IMPRESSION: No active disease. Electronically Signed   By: Marijo Conception M.D.   On: 12/17/2021 15:38     Scheduled Meds:  [START ON 12/19/2021] calcitRIOL  0.25 mcg Oral Q M,W,F   Chlorhexidine Gluconate Cloth  6 each Topical Daily   diltiazem  240 mg Oral Daily   [START ON 12/19/2021] ferrous sulfate  325 mg Oral Q breakfast   Finerenone  10 mg Oral Daily   guaiFENesin-dextromethorphan  10 mL Oral Q8H   insulin aspart  0-15 Units Subcutaneous Q6H   isosorbide dinitrate  10 mg Oral TID   losartan  12.5 mg Oral Daily   metoprolol tartrate  50 mg Oral BID   montelukast  10 mg Oral QHS   pantoprazole (PROTONIX) IV  40 mg Intravenous Q12H   rosuvastatin  20 mg Oral Daily   Continuous Infusions:   LOS: 0 days    Roxan Hockey M.D on 12/18/2021 at 6:01 PM  Go to [email or web address] - for contact info  [facility] - Office  [phone]  If 7PM-7AM, please contact night-coverage [email or web address] 12/18/2021, 6:01 PM

## 2021-12-18 NOTE — H&P (View-Only) (Signed)
Gastroenterology Consult   Referring Provider: No ref. provider found Primary Care Physician:  Anne Meiers, MD Primary Gastroenterologist:  Anne Cornea, MD Patient ID: [name]; [id number]; Jul 01, 1955   Admit date: 12/17/2021  LOS: 0 days   Date of Consultation: 12/18/2021  Reason for Consultation:  symptomatic anemia, GI bleed, on Eliquis    History of Present Illness   [name] is a 66 y.o. female resident at assisted living facility with multiple comorbidities including morbid obesity, obstructive sleep apnea, diabetes, chronic kidney disease stage III, A-fib on Eliquis, chronic diastolic CHF, CAD, hypertension, left lower lung carcinoid tumor (not a candidate for treatment), IDA followed by hematology last iron infusions in 03/2021 brought to the emergency department via EMS for dizziness of 1 week duration.  Patient also reported nasal congestion, nausea.  Patient has a DSS legal guardian, [name], who can be reached at [phone].  Patient recently completing amoxicillin for presumed sinus infection.  Reported increased stooling over the past 2 days, but no diarrhea.  Stools have been dark to black, darker than usual.  Patient denies any recent Pepto-Bismol. Chronically anticoagulated with Eliquis.  No reported NSAIDs on her medication list, she really is unsure.  She states her abdomen has been sore from her coughing.  She is a little nauseated.  No vomiting.  No heartburn or dysphagia.  In the ED: Dark stool, strongly Hemoccult positive.  Hemoglobin 4.8 down from 11.1 in May.  Platelets 310,000.  BUN 65.  Creatinine 2.29.  BNP 461.  SARS coronavirus 2 negative.  Hemoglobin 7.6 today status post 2 units of packed red blood cells.  Chest x-ray with no active disease.  Previous work-up:  EGD 2012 for IDA showed small hiatal hernia, couple of antral erosions, otherwise normal exam.  Biopsies negative for celiac disease or H. pylori.  Capsule study  not completed at this time because of multiple Hemoccult negative stools.  Colonoscopy April 2022 with seven 5 to 31mm polyps in the rectum and ascending colon resected.  Pathology revealed multiple tubular adenomas.  She had nonbleeding internal hemorrhoids.  Recommended 3-year surveillance exam.   She presented to GI again for IDA back in May 2023.  EGD updated in July 2023.  She had angry appearing gastric mucosa diffusely with erythema patchy erosions nodularity in the antrum.  Marked friability of the mucosa with touch bleeding.  IDA felt to be due to gastric blood loss in the setting of Eliquis.  Gastric biopsy showing chemical/reactive gastropathy.  No H. pylori.  Capsule study not felt to be indicated.  Prior to Admission medications   Medication Sig Start Date End Date Taking? Authorizing Provider  acetaminophen (TYLENOL) 325 MG tablet Take 325 mg by mouth every 12 (twelve) hours as needed for moderate pain.   Yes [provider]  albuterol (PROVENTIL HFA;VENTOLIN HFA) 108 (90 Base) MCG/ACT inhaler Inhale 2 puffs into the lungs every 4 (four) hours as needed for shortness of breath.   Yes [provider]  apixaban (ELIQUIS) 5 MG TABS tablet Take 1 tablet (5 mg total) by mouth 2 (two) times daily. 06/09/18  Yes Dessa Phi, DO  benzonatate (TESSALON) 100 MG capsule Take 100 mg by mouth 2 (two) times daily as needed for cough.   Yes [provider]  betamethasone dipropionate (DIPROLENE) 0.05 % ointment Apply 1 application  topically 2 (two) times daily as needed (for rash under breasts). 08/04/19  Yes [provider]  calcitRIOL (ROCALTROL) 0.25  MCG capsule Take 0.25 mcg by mouth every Monday, Wednesday, and Friday. 08/08/20  Yes [provider]  cetirizine (ZYRTEC) 10 MG tablet Take 10 mg by mouth daily. 06/06/19  Yes [provider]  Cholecalciferol (VITAMIN D3) 50 MCG (2000 UT) TABS TAKE 1 TABLET BY MOUTH ONCE DAILY. 01/02/21  Yes Nida,  Marella Chimes, MD  clotrimazole (LOTRIMIN) 1 % cream Apply 1 application  topically 2 (two) times daily as needed (for rash under breasts).   Yes [provider]  diltiazem (CARDIZEM CD) 240 MG 24 hr capsule Take 1 capsule (240 mg total) by mouth daily. 06/10/18  Yes Dessa Phi, DO  Finerenone (KERENDIA) 10 MG TABS Take 10 mg by mouth daily.   Yes [provider]  fluticasone (FLONASE) 50 MCG/ACT nasal spray Place 2 sprays into both nostrils daily as needed for allergies.   Yes [provider]  Fluticasone-Umeclidin-Vilant (TRELEGY ELLIPTA) 100-62.5-25 MCG/ACT AEPB Inhale 1 puff into the lungs daily.   Yes [provider]  furosemide (LASIX) 20 MG tablet Take 20 mg by mouth See admin instructions. Take 20 mg twice a day alternating with 20 mg once daily 12/12/21  Yes [provider]  furosemide (LASIX) 40 MG tablet Take 20 mg by mouth See admin instructions. Take 20 mg twice daily, alternating with 20 mg daily every other day 04/20/19  Yes [provider]  GERI-TUSSIN DM 10-100 MG/5ML liquid Take by mouth. 12/16/21  Yes [provider]  HUMULIN R U-500 KWIKPEN 500 UNIT/ML KwikPen INJECT 40 UNITS SUBCUTANEOUSLY AT BREAKFAST; INJECT 30 UNITS AT LUNCH & SUPPER; WHEN GLUCOSE IS ABOVE 90 & EATING.(HOLD IF BS BELOW 70: CALL MD IF BS ABOVE 400 Patient taking differently: 30-40 Units See admin instructions. Inject 40 units at breakfast, Inject 30 units at lunch and supper; when glucose is above 90 and eating (hold if BS below 70: Call MD if BS above 400) 09/19/21  Yes Nida, Marella Chimes, MD  hydrOXYzine (ATARAX) 25 MG tablet Take 25 mg by mouth 3 (three) times daily. 12/16/21  Yes [provider]  ipratropium-albuterol (DUONEB) 0.5-2.5 (3) MG/3ML SOLN Take 3 mLs by nebulization every 6 (six) hours as needed (for cough).   Yes [provider]  isosorbide dinitrate (ISORDIL) 10 MG tablet Take 1 tablet (10 mg total) by mouth 3  (three) times daily. 06/09/18  Yes Dessa Phi, DO  loperamide (IMODIUM) 2 MG capsule Take 2-4 mg by mouth See admin instructions. Take 4 mg by mouth now: then 2 mg after each loose stool as needed, do not exceed 5 doses in 24 hours.   Yes [provider]  losartan (COZAAR) 25 MG tablet Take 12.5 mg by mouth daily.   Yes [provider]  metolazone (ZAROXOLYN) 5 MG tablet Take 5 mg by mouth in the morning.   Yes [provider]  metoprolol tartrate (LOPRESSOR) 50 MG tablet Take 50 mg by mouth 2 (two) times daily. 05/08/20  Yes [provider]  montelukast (SINGULAIR) 10 MG tablet Take 10 mg by mouth at bedtime.   Yes [provider]  pantoprazole (PROTONIX) 40 MG tablet Take 1 tablet (40 mg total) by mouth daily. 10/16/21  Yes Rourk, Cristopher Estimable, MD  polyethylene glycol powder (GLYCOLAX/MIRALAX) 17 GM/SCOOP powder Take 17 g by mouth daily as needed for mild constipation or moderate constipation.   Yes [provider]  potassium chloride SA (KLOR-CON M) 20 MEQ tablet Take 20 mEq by mouth daily. 09/11/19  Yes [provider]  rosuvastatin (CRESTOR) 20 MG tablet TAKE (1) TABLET BY MOUTH ONCE DAILY. Patient taking differently: Take 20 mg by mouth daily. 12/20/19  Yes Nida, Marella Chimes, MD  triamcinolone cream (KENALOG) 0.5 % Apply 1 application topically every 12 (twelve) hours as needed (applied to bilateral legs for allergic dermatitis).   Yes [provider]  Vitamin D, Cholecalciferol, 25 MCG (1000 UT) TABS TAKE 1 TABLET BY MOUTH ONCE DAILY. Patient taking differently: Take 2 tablets by mouth daily. 06/16/21  Yes Nida, Marella Chimes, MD        [provider]  DROPSAFE SAFETY PEN NEEDLES 31G X 6 MM [place]  10/03/20   [provider]  EASYMAX TEST test strip CHECK BLOOD SUGAR 4 TIMES A DAY. (BREAKFAST, LUNCH, SUPPER & BEDTIME) 12/15/21   Nida, Marella Chimes, MD  guaiFENesin (MUCINEX) 600 MG 12 hr tablet Take 1  tablet (600 mg total) by mouth 2 (two) times daily as needed. 02/20/20   Strader, Fransisco Hertz, PA-C  Lancets 28G MISC Use to test BG qid. E11.65 10/07/21   Cassandria Anger, MD    Current Facility-Administered Medications  Medication Dose Route Frequency Provider Last Rate Last Admin   acetaminophen (TYLENOL) tablet 650 mg  650 mg Oral Q6H PRN Emokpae, Ejiroghene E, MD   650 mg at 12/18/21 0212   Or   acetaminophen (TYLENOL) suppository 650 mg  650 mg Rectal Q6H PRN Emokpae, Ejiroghene E, MD       [START ON 12/19/2021] calcitRIOL (ROCALTROL) capsule 0.25 mcg  0.25 mcg Oral Q M,W,F Emokpae, Ejiroghene E, MD       diltiazem (CARDIZEM CD) 24 hr capsule 240 mg  240 mg Oral Daily Emokpae, Ejiroghene E, MD       Finerenone TABS 10 mg  10 mg Oral Daily Emokpae, Ejiroghene E, MD       guaiFENesin-dextromethorphan (ROBITUSSIN DM) 100-10 MG/5ML syrup 10 mL  10 mL Oral Q8H Emokpae, Ejiroghene E, MD   10 mL at 12/17/21 2308   insulin aspart (novoLOG) injection 0-15 Units  0-15 Units Subcutaneous Q6H Emokpae, Ejiroghene E, MD   2 Units at 12/18/21 0608   ipratropium-albuterol (DUONEB) 0.5-2.5 (3) MG/3ML nebulizer solution 3 mL  3 mL Nebulization Q6H PRN Emokpae, Ejiroghene E, MD       ipratropium-albuterol (DUONEB) 0.5-2.5 (3) MG/3ML nebulizer solution 3 mL  3 mL Nebulization Q4H PRN Emokpae, Ejiroghene E, MD       isosorbide dinitrate (ISORDIL) tablet 10 mg  10 mg Oral TID Emokpae, Ejiroghene E, MD       losartan (COZAAR) tablet 12.5 mg  12.5 mg Oral Daily Emokpae, Ejiroghene E, MD       metoprolol tartrate (LOPRESSOR) tablet 50 mg  50 mg Oral BID Emokpae, Ejiroghene E, MD   50 mg at 12/17/21 2308   montelukast (SINGULAIR) tablet 10 mg  10 mg Oral QHS Emokpae, Ejiroghene E, MD   10 mg at 12/17/21 2308   ondansetron (ZOFRAN) tablet 4 mg  4 mg Oral Q6H PRN Emokpae, Ejiroghene E, MD       Or   ondansetron (ZOFRAN) injection 4 mg  4 mg Intravenous Q6H PRN Emokpae, Ejiroghene E, MD       pantoprazole  (PROTONIX) injection 40 mg  40 mg Intravenous Q12H Emokpae, Ejiroghene E, MD   40 mg at 12/18/21 0228   polyethylene glycol (MIRALAX / GLYCOLAX) packet 17 g  17 g Oral Daily PRN Emokpae, Leanne Chang, MD  rosuvastatin (CRESTOR) tablet 20 mg  20 mg Oral Daily Emokpae, Ejiroghene E, MD        Allergies as of 12/17/2021   (No Known Allergies)    Past Medical History:  Diagnosis Date   Arthritis    Asthma    Atrial fibrillation (HCC)    Carcinoid tumor determined by biopsy of lung 05/2018   Left lung   Chronic diastolic CHF (congestive heart failure) (HCC)    CKD (chronic kidney disease), stage III ([place])    Diabetes mellitus type 2 in obese [facility])    Essential hypertension    Iron deficiency anemia 10/16/2010   Mental handicap 10/16/2010   Mild CAD 2013   Morbid obesity (HCC)    OSA (obstructive sleep apnea)     Past Surgical History:  Procedure Laterality Date   ABDOMINAL HYSTERECTOMY     AV FISTULA PLACEMENT Right 06/06/2018   Procedure: ARTERIOVENOUS (AV) FISTULA CREATION;  Surgeon: Marty Heck, MD;  Location: [facility];  Service: Vascular;  Laterality: Right;   BIOPSY  10/16/2021   Procedure: BIOPSY;  Surgeon: Daneil Dolin, MD;  [facility];  Service: Endoscopy;;   BRONCHIAL DILITATION  12/13/2020   Procedure: BRONCHIAL DILITATION;  Surgeon: Garner Nash, DO;  Location: [facility];  Service: Pulmonary;;   CESAREAN SECTION     CHOLECYSTECTOMY     COLONOSCOPY  08/2010   normal TI, sigmoid polyp (adenoma ). Next TCS due  08/2015,   COLONOSCOPY WITH PROPOFOL N/A 07/08/2020   Procedure: COLONOSCOPY WITH PROPOFOL;  Surgeon: Daneil Dolin, MD;  [facility];  Service: Endoscopy;  Laterality: N/A;  AM (diabetic and facility patient)   ESOPHAGOGASTRODUODENOSCOPY  08/2010   antral and duodenal erosions s/p bx (chronic gastritis, no h.pylori, no celiac dz ), hiatal hernia   ESOPHAGOGASTRODUODENOSCOPY (EGD) WITH PROPOFOL N/A 10/16/2021   Procedure:  ESOPHAGOGASTRODUODENOSCOPY (EGD) WITH PROPOFOL;  Surgeon: Daneil Dolin, MD;  [facility];  Service: Endoscopy;  Laterality: N/A;  11:45am   FISTULA SUPERFICIALIZATION Right 08/25/2018   Procedure: FISTULA SUPERFICIALIZATION RIGHT ARM;  Surgeon: Waynetta Sandy, MD;  Location: [facility];  Service: Cardiovascular;  Laterality: Right;   IR FLUORO GUIDE CV LINE RIGHT  05/31/2018   IR US GUIDE VASC ACCESS RIGHT  05/31/2018   KNEE SURGERY     right knee @ 66 years of age   LEFT AND RIGHT HEART CATHETERIZATION WITH CORONARY ANGIOGRAM N/A 09/21/2011   Procedure: LEFT AND RIGHT HEART CATHETERIZATION WITH CORONARY ANGIOGRAM;  Surgeon: Birdie Riddle, MD;  Location: [facility];  Service: Cardiovascular;  Laterality: N/A;   POLYPECTOMY  07/08/2020   Procedure: POLYPECTOMY INTESTINAL;  Surgeon: Daneil Dolin, MD;  [facility];  Service: Endoscopy;;   VIDEO BRONCHOSCOPY Left 12/13/2020   Procedure: VIDEO BRONCHOSCOPY WITHOUT FLUORO;  Surgeon: Garner Nash, DO;  Location: [facility];  Service: Pulmonary;  Laterality: Left;  Cryotherapy    Family History  Problem Relation Age of Onset   Diabetes Mother    Hypertension Mother    Heart failure Mother    Hypertension Father    Atrial fibrillation Father    Hypertension Son    Kidney disease Other        son reportedly has had cyst on kidney and lung s/p surgery at [facility]   Colon cancer Neg Hx     Social History   Socioeconomic History   Marital status: Divorced    Spouse name:  Not on file   Number of children: 1   Years of education: Not on file   Highest education level: Not on file  Occupational History    Employer: UNEMPLOYED  Tobacco Use   Smoking status: Former    Packs/day: 1.00    Years: 0.00    Total pack years: 0.00    Types: Cigarettes    Quit date: 04/11/1996    Years since quitting: 25.7   Smokeless tobacco: Never   Tobacco comments:    quit a couple year ago  Vaping Use   Vaping Use: Never  used  Substance and Sexual Activity   Alcohol use: No   Drug use: No   Sexual activity: Never  Other Topics Concern   Not on file  Social History Narrative   Lives with parents.    Social Determinants of Health   Financial Resource Strain: Not on file  Food Insecurity: No Food Insecurity (12/17/2021)   Hunger Vital Sign    Worried About Running Out of Food in the Last Year: Never true    Ran Out of Food in the Last Year: Never true  Transportation Needs: No Transportation Needs (12/17/2021)   PRAPARE - Hydrologist (Medical): No    Lack of Transportation (Non-Medical): No  Physical Activity: Not on file  Stress: Not on file  Social Connections: Not on file  Intimate Partner Violence: Not At Risk (12/17/2021)   Humiliation, Afraid, Rape, and Kick questionnaire    Fear of Current or Ex-Partner: No    Emotionally Abused: No    Physically Abused: No    Sexually Abused: No     Review of System:   General: Negative for anorexia, weight loss, fever, chills, fatigue, positive weakness.  Complains of dizziness. Eyes: Negative for vision changes.  ENT: Negative for hoarseness, difficulty swallowing , positive nasal congestion. CV: Negative for chest pain, angina, palpitations, dyspnea on exertion, peripheral edema.  Respiratory: Negative for dyspnea at rest, dyspnea on exertion, positive cough, sputum, wheezing.  GI: See history of present illness. GU:  Negative for dysuria, hematuria, urinary incontinence, urinary frequency, nocturnal urination.  MS: Negative for joint pain, low back pain.  Derm: Negative for rash or itching.  Neuro: Negative for weakness, abnormal sensation, seizure, frequent headaches, memory loss, confusion.  Psych: Negative for anxiety, depression, suicidal ideation, hallucinations.  Endo: Negative for unusual weight change.  Heme: Negative for bruising or bleeding. Allergy: Negative for rash or hives.      Physical Examination:    Vital signs in last 24 hours: Temp:  [97.5 F (36.4 C)-98.3 F (36.8 C)] 97.6 F (36.4 C) (09/21 0554) Pulse Rate:  [51-85] 85 (09/21 0554) Resp:  [11-23] 18 (09/21 0554) BP: (93-137)/(57-92) 93/61 (09/21 0554) SpO2:  [96 %-100 %] 100 % (09/21 0554) Weight:  [118.6 kg] 118.6 kg (09/20 2141) Last BM Date : 12/16/21  General: Well-nourished, well-developed in no acute distress.  Head: Normocephalic, atraumatic.   Eyes: Conjunctiva pink, no icterus. Mouth: Oropharyngeal mucosa moist and pink , no lesions erythema or exudate. Neck: Supple without thyromegaly, masses, or lymphadenopathy.  Lungs: Clear to auscultation bilaterally.  Heart: Regular rate and rhythm, no murmurs rubs or gallops.  Abdomen: Bowel sounds are normal, nontender, nondistended, no hepatosplenomegaly or masses, no abdominal bruits or hernia , no rebound or guarding.   Rectal: Not performed  Lower extremities: 1+  lower extremity edema, clubbing, deformity.  Neuro: Alert and oriented x 4 , grossly normal  neurologically.  Skin: Warm and dry, no rash or jaundice.   Psych: Alert and cooperative, normal mood and affect.        Intake/Output from previous day: 09/20 0701 - 09/21 0700 In: 671 [Blood:671] Out: 3300 [Urine:3300] Intake/Output this shift: No intake/output data recorded.  Lab Results:   CBC Recent Labs    12/17/21 1454 12/18/21 0011  WBC 11.0* 9.6  HGB 4.8* 7.4*  7.6*  HCT 16.0* 23.4*  23.8*  MCV 92.5 90.0  PLT 310 276   BMET Recent Labs    12/17/21 1454 12/18/21 0011  NA 133* 136  K 4.4 3.5  CL 98 98  CO2 26 26  GLUCOSE 119* 100*  BUN 65* 60*  CREATININE 2.29* 2.21*  CALCIUM 8.8* 9.3    LFT No results for input(s): "BILITOT", "BILIDIR", "IBILI", "ALKPHOS", "AST", "ALT", "PROT", "ALBUMIN" in the last 72 hours.  Lipase No results for input(s): "LIPASE" in the last 72 hours.  PT/INR No results for input(s): "LABPROT", "INR" in the last 72 hours.   Hepatitis Panel No  results for input(s): "HEPBSAG", "HCVAB", "HEPAIGM", "HEPBIGM" in the last 72 hours.   Imaging Studies:   DG Chest Portable 1 View  Result Date: 12/17/2021 CLINICAL DATA:  Cough, chest pain. EXAM: PORTABLE CHEST 1 VIEW COMPARISON:  May 07, 2020. FINDINGS: Stable cardiomegaly. The heart size and mediastinal contours are within normal limits. Both lungs are clear. The visualized skeletal structures are unremarkable. IMPRESSION: No active disease. Electronically Signed   By: Marijo Conception M.D.   On: 12/17/2021 15:38  [4 week]  Assessment:   66 year old female with multiple comorbidities including morbid obesity, obstructive sleep apnea, diabetes, chronic kidney disease stage III, A-fib on Eliquis, chronic diastolic CHF, CAD, hypertension, left lower lung carcinoid tumor, IDA followed by hematology receiving iron infusions who was brought to the emergency department for dizziness for 1 week.  Recent antibiotics for upper respiratory infection.  Acute on chronic anemia: Significant decline in hemoglobin over the past 4 months from 11.1-4.8.  Reported dark to black stools, darker than usual.  Present over the past couple of days.  Fairly recent colonoscopy in April 2022 with tubular adenomas removed.  EGD in May 2023 with angry appearing gastric mucosa which was diffusely friable, biopsies unremarkable.  It was felt that these findings could contribute to IDA due to gastric blood loss in the setting of Eliquis.  Presenting now with acute drop in hemoglobin, symptomatic anemia, reported dark/black stools.  Likely upper GI bleed.  She has never had a capsule endoscopy because it previously was not indicated.  Plan:   Esophagogastroduodenoscopy today.  I have discussed the risks, alternatives, benefits with regards to but not limited to the risk of reaction to medication, bleeding, infection, perforation and the patient is agreeable to proceed. Written consent to be obtained. I spoke with DSS legal  guardian, [name], [phone] who also agrees to the procedure. Continue PPI IV BID.   LOS: 0 days   We would like to thank you for the opportunity to participate in the care of [name].  Laureen Ochs. Bernarda Caffey [facility] [phone] 9/21/20238:36 AM

## 2021-12-18 NOTE — Consult Note (Addendum)
Gastroenterology Consult   Referring Provider: No ref. provider found Primary Care Physician:  Anne Meiers, MD Primary Gastroenterologist:  Anne Cornea, MD Patient ID: [name]; [id number]; 1955/05/29   Admit date: 12/17/2021  LOS: 0 days   Date of Consultation: 12/18/2021  Reason for Consultation:  symptomatic anemia, GI bleed, on Eliquis    History of Present Illness   [name] is a 66 y.o. female resident at assisted living facility with multiple comorbidities including morbid obesity, obstructive sleep apnea, diabetes, chronic kidney disease stage III, A-fib on Eliquis, chronic diastolic CHF, CAD, hypertension, left lower lung carcinoid tumor (not a candidate for treatment), IDA followed by hematology last iron infusions in 03/2021 brought to the emergency department via EMS for dizziness of 1 week duration.  Patient also reported nasal congestion, nausea.  Patient has a DSS legal guardian, [name], who can be reached at [phone].  Patient recently completing amoxicillin for presumed sinus infection.  Reported increased stooling over the past 2 days, but no diarrhea.  Stools have been dark to black, darker than usual.  Patient denies any recent Pepto-Bismol. Chronically anticoagulated with Eliquis.  No reported NSAIDs on her medication list, she really is unsure.  She states her abdomen has been sore from her coughing.  She is a little nauseated.  No vomiting.  No heartburn or dysphagia.  In the ED: Dark stool, strongly Hemoccult positive.  Hemoglobin 4.8 down from 11.1 in May.  Platelets 310,000.  BUN 65.  Creatinine 2.29.  BNP 461.  SARS coronavirus 2 negative.  Hemoglobin 7.6 today status post 2 units of packed red blood cells.  Chest x-ray with no active disease.  Previous work-up:  EGD 2012 for IDA showed small hiatal hernia, couple of antral erosions, otherwise normal exam.  Biopsies negative for celiac disease or H. pylori.  Capsule study  not completed at this time because of multiple Hemoccult negative stools.  Colonoscopy April 2022 with seven 5 to 63mm polyps in the rectum and ascending colon resected.  Pathology revealed multiple tubular adenomas.  She had nonbleeding internal hemorrhoids.  Recommended 3-year surveillance exam.   She presented to GI again for IDA back in May 2023.  EGD updated in July 2023.  She had angry appearing gastric mucosa diffusely with erythema patchy erosions nodularity in the antrum.  Marked friability of the mucosa with touch bleeding.  IDA felt to be due to gastric blood loss in the setting of Eliquis.  Gastric biopsy showing chemical/reactive gastropathy.  No H. pylori.  Capsule study not felt to be indicated.  Prior to Admission medications   Medication Sig Start Date End Date Taking? Authorizing Provider  acetaminophen (TYLENOL) 325 MG tablet Take 325 mg by mouth every 12 (twelve) hours as needed for moderate pain.   Yes [provider]  albuterol (PROVENTIL HFA;VENTOLIN HFA) 108 (90 Base) MCG/ACT inhaler Inhale 2 puffs into the lungs every 4 (four) hours as needed for shortness of breath.   Yes [provider]  apixaban (ELIQUIS) 5 MG TABS tablet Take 1 tablet (5 mg total) by mouth 2 (two) times daily. 06/09/18  Yes Dessa Phi, DO  benzonatate (TESSALON) 100 MG capsule Take 100 mg by mouth 2 (two) times daily as needed for cough.   Yes [provider]  betamethasone dipropionate (DIPROLENE) 0.05 % ointment Apply 1 application  topically 2 (two) times daily as needed (for rash under breasts). 08/04/19  Yes [provider]  calcitRIOL (ROCALTROL) 0.25  MCG capsule Take 0.25 mcg by mouth every Monday, Wednesday, and Friday. 08/08/20  Yes [provider]  cetirizine (ZYRTEC) 10 MG tablet Take 10 mg by mouth daily. 06/06/19  Yes [provider]  Cholecalciferol (VITAMIN D3) 50 MCG (2000 UT) TABS TAKE 1 TABLET BY MOUTH ONCE DAILY. 01/02/21  Yes Nida,  Marella Chimes, MD  clotrimazole (LOTRIMIN) 1 % cream Apply 1 application  topically 2 (two) times daily as needed (for rash under breasts).   Yes [provider]  diltiazem (CARDIZEM CD) 240 MG 24 hr capsule Take 1 capsule (240 mg total) by mouth daily. 06/10/18  Yes Dessa Phi, DO  Finerenone (KERENDIA) 10 MG TABS Take 10 mg by mouth daily.   Yes [provider]  fluticasone (FLONASE) 50 MCG/ACT nasal spray Place 2 sprays into both nostrils daily as needed for allergies.   Yes [provider]  Fluticasone-Umeclidin-Vilant (TRELEGY ELLIPTA) 100-62.5-25 MCG/ACT AEPB Inhale 1 puff into the lungs daily.   Yes [provider]  furosemide (LASIX) 20 MG tablet Take 20 mg by mouth See admin instructions. Take 20 mg twice a day alternating with 20 mg once daily 12/12/21  Yes [provider]  furosemide (LASIX) 40 MG tablet Take 20 mg by mouth See admin instructions. Take 20 mg twice daily, alternating with 20 mg daily every other day 04/20/19  Yes [provider]  GERI-TUSSIN DM 10-100 MG/5ML liquid Take by mouth. 12/16/21  Yes [provider]  HUMULIN R U-500 KWIKPEN 500 UNIT/ML KwikPen INJECT 40 UNITS SUBCUTANEOUSLY AT BREAKFAST; INJECT 30 UNITS AT LUNCH & SUPPER; WHEN GLUCOSE IS ABOVE 90 & EATING.(HOLD IF BS BELOW 70: CALL MD IF BS ABOVE 400 Patient taking differently: 30-40 Units See admin instructions. Inject 40 units at breakfast, Inject 30 units at lunch and supper; when glucose is above 90 and eating (hold if BS below 70: Call MD if BS above 400) 09/19/21  Yes Nida, Marella Chimes, MD  hydrOXYzine (ATARAX) 25 MG tablet Take 25 mg by mouth 3 (three) times daily. 12/16/21  Yes [provider]  ipratropium-albuterol (DUONEB) 0.5-2.5 (3) MG/3ML SOLN Take 3 mLs by nebulization every 6 (six) hours as needed (for cough).   Yes [provider]  isosorbide dinitrate (ISORDIL) 10 MG tablet Take 1 tablet (10 mg total) by mouth 3  (three) times daily. 06/09/18  Yes Dessa Phi, DO  loperamide (IMODIUM) 2 MG capsule Take 2-4 mg by mouth See admin instructions. Take 4 mg by mouth now: then 2 mg after each loose stool as needed, do not exceed 5 doses in 24 hours.   Yes [provider]  losartan (COZAAR) 25 MG tablet Take 12.5 mg by mouth daily.   Yes [provider]  metolazone (ZAROXOLYN) 5 MG tablet Take 5 mg by mouth in the morning.   Yes [provider]  metoprolol tartrate (LOPRESSOR) 50 MG tablet Take 50 mg by mouth 2 (two) times daily. 05/08/20  Yes [provider]  montelukast (SINGULAIR) 10 MG tablet Take 10 mg by mouth at bedtime.   Yes [provider]  pantoprazole (PROTONIX) 40 MG tablet Take 1 tablet (40 mg total) by mouth daily. 10/16/21  Yes Rourk, Cristopher Estimable, MD  polyethylene glycol powder (GLYCOLAX/MIRALAX) 17 GM/SCOOP powder Take 17 g by mouth daily as needed for mild constipation or moderate constipation.   Yes [provider]  potassium chloride SA (KLOR-CON M) 20 MEQ tablet Take 20 mEq by mouth daily. 09/11/19  Yes [provider]  rosuvastatin (CRESTOR) 20 MG tablet TAKE (1) TABLET BY MOUTH ONCE DAILY. Patient taking differently: Take 20 mg by mouth daily. 12/20/19  Yes Nida, Marella Chimes, MD  triamcinolone cream (KENALOG) 0.5 % Apply 1 application topically every 12 (twelve) hours as needed (applied to bilateral legs for allergic dermatitis).   Yes [provider]  Vitamin D, Cholecalciferol, 25 MCG (1000 UT) TABS TAKE 1 TABLET BY MOUTH ONCE DAILY. Patient taking differently: Take 2 tablets by mouth daily. 06/16/21  Yes Nida, Marella Chimes, MD        [provider]  DROPSAFE SAFETY PEN NEEDLES 31G X 6 MM [place]  10/03/20   [provider]  EASYMAX TEST test strip CHECK BLOOD SUGAR 4 TIMES A DAY. (BREAKFAST, LUNCH, SUPPER & BEDTIME) 12/15/21   Nida, Marella Chimes, MD  guaiFENesin (MUCINEX) 600 MG 12 hr tablet Take 1  tablet (600 mg total) by mouth 2 (two) times daily as needed. 02/20/20   Strader, Fransisco Hertz, PA-C  Lancets 28G MISC Use to test BG qid. E11.65 10/07/21   Cassandria Anger, MD    Current Facility-Administered Medications  Medication Dose Route Frequency Provider Last Rate Last Admin   acetaminophen (TYLENOL) tablet 650 mg  650 mg Oral Q6H PRN Emokpae, Ejiroghene E, MD   650 mg at 12/18/21 0212   Or   acetaminophen (TYLENOL) suppository 650 mg  650 mg Rectal Q6H PRN Emokpae, Ejiroghene E, MD       [START ON 12/19/2021] calcitRIOL (ROCALTROL) capsule 0.25 mcg  0.25 mcg Oral Q M,W,F Emokpae, Ejiroghene E, MD       diltiazem (CARDIZEM CD) 24 hr capsule 240 mg  240 mg Oral Daily Emokpae, Ejiroghene E, MD       Finerenone TABS 10 mg  10 mg Oral Daily Emokpae, Ejiroghene E, MD       guaiFENesin-dextromethorphan (ROBITUSSIN DM) 100-10 MG/5ML syrup 10 mL  10 mL Oral Q8H Emokpae, Ejiroghene E, MD   10 mL at 12/17/21 2308   insulin aspart (novoLOG) injection 0-15 Units  0-15 Units Subcutaneous Q6H Emokpae, Ejiroghene E, MD   2 Units at 12/18/21 0608   ipratropium-albuterol (DUONEB) 0.5-2.5 (3) MG/3ML nebulizer solution 3 mL  3 mL Nebulization Q6H PRN Emokpae, Ejiroghene E, MD       ipratropium-albuterol (DUONEB) 0.5-2.5 (3) MG/3ML nebulizer solution 3 mL  3 mL Nebulization Q4H PRN Emokpae, Ejiroghene E, MD       isosorbide dinitrate (ISORDIL) tablet 10 mg  10 mg Oral TID Emokpae, Ejiroghene E, MD       losartan (COZAAR) tablet 12.5 mg  12.5 mg Oral Daily Emokpae, Ejiroghene E, MD       metoprolol tartrate (LOPRESSOR) tablet 50 mg  50 mg Oral BID Emokpae, Ejiroghene E, MD   50 mg at 12/17/21 2308   montelukast (SINGULAIR) tablet 10 mg  10 mg Oral QHS Emokpae, Ejiroghene E, MD   10 mg at 12/17/21 2308   ondansetron (ZOFRAN) tablet 4 mg  4 mg Oral Q6H PRN Emokpae, Ejiroghene E, MD       Or   ondansetron (ZOFRAN) injection 4 mg  4 mg Intravenous Q6H PRN Emokpae, Ejiroghene E, MD       pantoprazole  (PROTONIX) injection 40 mg  40 mg Intravenous Q12H Emokpae, Ejiroghene E, MD   40 mg at 12/18/21 0228   polyethylene glycol (MIRALAX / GLYCOLAX) packet 17 g  17 g Oral Daily PRN Emokpae, Leanne Chang, MD  rosuvastatin (CRESTOR) tablet 20 mg  20 mg Oral Daily Emokpae, Ejiroghene E, MD        Allergies as of 12/17/2021   (No Known Allergies)    Past Medical History:  Diagnosis Date   Arthritis    Asthma    Atrial fibrillation (HCC)    Carcinoid tumor determined by biopsy of lung 05/2018   Left lung   Chronic diastolic CHF (congestive heart failure) (HCC)    CKD (chronic kidney disease), stage III ([place])    Diabetes mellitus type 2 in obese [facility])    Essential hypertension    Iron deficiency anemia 10/16/2010   Mental handicap 10/16/2010   Mild CAD 2013   Morbid obesity (HCC)    OSA (obstructive sleep apnea)     Past Surgical History:  Procedure Laterality Date   ABDOMINAL HYSTERECTOMY     AV FISTULA PLACEMENT Right 06/06/2018   Procedure: ARTERIOVENOUS (AV) FISTULA CREATION;  Surgeon: Marty Heck, MD;  Location: [place];  Service: Vascular;  Laterality: Right;   BIOPSY  10/16/2021   Procedure: BIOPSY;  Surgeon: Daneil Dolin, MD;  [facility];  Service: Endoscopy;;   BRONCHIAL DILITATION  12/13/2020   Procedure: BRONCHIAL DILITATION;  Surgeon: Garner Nash, DO;  Location: [place] [facility];  Service: Pulmonary;;   CESAREAN SECTION     CHOLECYSTECTOMY     COLONOSCOPY  08/2010   normal TI, sigmoid polyp (adenoma ). Next TCS due  08/2015,   COLONOSCOPY WITH PROPOFOL N/A 07/08/2020   Procedure: COLONOSCOPY WITH PROPOFOL;  Surgeon: Daneil Dolin, MD;  [facility];  Service: Endoscopy;  Laterality: N/A;  AM (diabetic and facility patient)   ESOPHAGOGASTRODUODENOSCOPY  08/2010   antral and duodenal erosions s/p bx (chronic gastritis, no h.pylori, no celiac dz ), hiatal hernia   ESOPHAGOGASTRODUODENOSCOPY (EGD) WITH PROPOFOL N/A 10/16/2021   Procedure:  ESOPHAGOGASTRODUODENOSCOPY (EGD) WITH PROPOFOL;  Surgeon: Daneil Dolin, MD;  [facility];  Service: Endoscopy;  Laterality: N/A;  11:45am   FISTULA SUPERFICIALIZATION Right 08/25/2018   Procedure: FISTULA SUPERFICIALIZATION RIGHT ARM;  Surgeon: Waynetta Sandy, MD;  Location: [place];  Service: Cardiovascular;  Laterality: Right;   IR FLUORO GUIDE CV LINE RIGHT  05/31/2018   IR US GUIDE VASC ACCESS RIGHT  05/31/2018   KNEE SURGERY     right knee @ 66 years of age   LEFT AND RIGHT HEART CATHETERIZATION WITH CORONARY ANGIOGRAM N/A 09/21/2011   Procedure: LEFT AND RIGHT HEART CATHETERIZATION WITH CORONARY ANGIOGRAM;  Surgeon: Birdie Riddle, MD;  Location: [place] [facility];  Service: Cardiovascular;  Laterality: N/A;   POLYPECTOMY  07/08/2020   Procedure: POLYPECTOMY INTESTINAL;  Surgeon: Daneil Dolin, MD;  [facility];  Service: Endoscopy;;   VIDEO BRONCHOSCOPY Left 12/13/2020   Procedure: VIDEO BRONCHOSCOPY WITHOUT FLUORO;  Surgeon: Garner Nash, DO;  Location: [place];  Service: Pulmonary;  Laterality: Left;  Cryotherapy    Family History  Problem Relation Age of Onset   Diabetes Mother    Hypertension Mother    Heart failure Mother    Hypertension Father    Atrial fibrillation Father    Hypertension Son    Kidney disease Other        son reportedly has had cyst on kidney and lung s/p surgery [facility]   Colon cancer Neg Hx     Social History   Socioeconomic History   Marital status: Divorced    Spouse name:  Not on file   Number of children: 1   Years of education: Not on file   Highest education level: Not on file  Occupational History    Employer: UNEMPLOYED  Tobacco Use   Smoking status: Former    Packs/day: 1.00    Years: 0.00    Total pack years: 0.00    Types: Cigarettes    Quit date: 04/11/1996    Years since quitting: 25.7   Smokeless tobacco: Never   Tobacco comments:    quit a couple year ago  Vaping Use   Vaping Use: Never  used  Substance and Sexual Activity   Alcohol use: No   Drug use: No   Sexual activity: Never  Other Topics Concern   Not on file  Social History Narrative   Lives with parents.    Social Determinants of Health   Financial Resource Strain: Not on file  Food Insecurity: No Food Insecurity (12/17/2021)   Hunger Vital Sign    Worried About Running Out of Food in the Last Year: Never true    Ran Out of Food in the Last Year: Never true  Transportation Needs: No Transportation Needs (12/17/2021)   PRAPARE - Hydrologist (Medical): No    Lack of Transportation (Non-Medical): No  Physical Activity: Not on file  Stress: Not on file  Social Connections: Not on file  Intimate Partner Violence: Not At Risk (12/17/2021)   Humiliation, Afraid, Rape, and Kick questionnaire    Fear of Current or Ex-Partner: No    Emotionally Abused: No    Physically Abused: No    Sexually Abused: No     Review of System:   General: Negative for anorexia, weight loss, fever, chills, fatigue, positive weakness.  Complains of dizziness. Eyes: Negative for vision changes.  ENT: Negative for hoarseness, difficulty swallowing , positive nasal congestion. CV: Negative for chest pain, angina, palpitations, dyspnea on exertion, peripheral edema.  Respiratory: Negative for dyspnea at rest, dyspnea on exertion, positive cough, sputum, wheezing.  GI: See history of present illness. GU:  Negative for dysuria, hematuria, urinary incontinence, urinary frequency, nocturnal urination.  MS: Negative for joint pain, low back pain.  Derm: Negative for rash or itching.  Neuro: Negative for weakness, abnormal sensation, seizure, frequent headaches, memory loss, confusion.  Psych: Negative for anxiety, depression, suicidal ideation, hallucinations.  Endo: Negative for unusual weight change.  Heme: Negative for bruising or bleeding. Allergy: Negative for rash or hives.      Physical Examination:    Vital signs in last 24 hours: Temp:  [97.5 F (36.4 C)-98.3 F (36.8 C)] 97.6 F (36.4 C) (09/21 0554) Pulse Rate:  [51-85] 85 (09/21 0554) Resp:  [11-23] 18 (09/21 0554) BP: (93-137)/(57-92) 93/61 (09/21 0554) SpO2:  [96 %-100 %] 100 % (09/21 0554) Weight:  [118.6 kg] 118.6 kg (09/20 2141) Last BM Date : 12/16/21  General: Well-nourished, well-developed in no acute distress.  Head: Normocephalic, atraumatic.   Eyes: Conjunctiva pink, no icterus. Mouth: Oropharyngeal mucosa moist and pink , no lesions erythema or exudate. Neck: Supple without thyromegaly, masses, or lymphadenopathy.  Lungs: Clear to auscultation bilaterally.  Heart: Regular rate and rhythm, no murmurs rubs or gallops.  Abdomen: Bowel sounds are normal, nontender, nondistended, no hepatosplenomegaly or masses, no abdominal bruits or hernia , no rebound or guarding.   Rectal: Not performed  Lower extremities: 1+  lower extremity edema, clubbing, deformity.  Neuro: Alert and oriented x 4 , grossly normal  neurologically.  Skin: Warm and dry, no rash or jaundice.   Psych: Alert and cooperative, normal mood and affect.        Intake/Output from previous day: 09/20 0701 - 09/21 0700 In: 671 [Blood:671] Out: 3300 [Urine:3300] Intake/Output this shift: No intake/output data recorded.  Lab Results:   CBC Recent Labs    12/17/21 1454 12/18/21 0011  WBC 11.0* 9.6  HGB 4.8* 7.4*  7.6*  HCT 16.0* 23.4*  23.8*  MCV 92.5 90.0  PLT 310 276   BMET Recent Labs    12/17/21 1454 12/18/21 0011  NA 133* 136  K 4.4 3.5  CL 98 98  CO2 26 26  GLUCOSE 119* 100*  BUN 65* 60*  CREATININE 2.29* 2.21*  CALCIUM 8.8* 9.3    LFT No results for input(s): "BILITOT", "BILIDIR", "IBILI", "ALKPHOS", "AST", "ALT", "PROT", "ALBUMIN" in the last 72 hours.  Lipase No results for input(s): "LIPASE" in the last 72 hours.  PT/INR No results for input(s): "LABPROT", "INR" in the last 72 hours.   Hepatitis Panel No  results for input(s): "HEPBSAG", "HCVAB", "HEPAIGM", "HEPBIGM" in the last 72 hours.   Imaging Studies:   DG Chest Portable 1 View  Result Date: 12/17/2021 CLINICAL DATA:  Cough, chest pain. EXAM: PORTABLE CHEST 1 VIEW COMPARISON:  May 07, 2020. FINDINGS: Stable cardiomegaly. The heart size and mediastinal contours are within normal limits. Both lungs are clear. The visualized skeletal structures are unremarkable. IMPRESSION: No active disease. Electronically Signed   By: Marijo Conception M.D.   On: 12/17/2021 15:38  [4 week]  Assessment:   66 year old female with multiple comorbidities including morbid obesity, obstructive sleep apnea, diabetes, chronic kidney disease stage III, A-fib on Eliquis, chronic diastolic CHF, CAD, hypertension, left lower lung carcinoid tumor, IDA followed by hematology receiving iron infusions who was brought to the emergency department for dizziness for 1 week.  Recent antibiotics for upper respiratory infection.  Acute on chronic anemia: Significant decline in hemoglobin over the past 4 months from 11.1-4.8.  Reported dark to black stools, darker than usual.  Present over the past couple of days.  Fairly recent colonoscopy in April 2022 with tubular adenomas removed.  EGD in May 2023 with angry appearing gastric mucosa which was diffusely friable, biopsies unremarkable.  It was felt that these findings could contribute to IDA due to gastric blood loss in the setting of Eliquis.  Presenting now with acute drop in hemoglobin, symptomatic anemia, reported dark/black stools.  Likely upper GI bleed.  She has never had a capsule endoscopy because it previously was not indicated.  Plan:   Esophagogastroduodenoscopy today.  I have discussed the risks, alternatives, benefits with regards to but not limited to the risk of reaction to medication, bleeding, infection, perforation and the patient is agreeable to proceed. Written consent to be obtained. I spoke with DSS legal  guardian, [name], [phone] who also agrees to the procedure. Continue PPI IV BID.   LOS: 0 days   We would like to thank you for the opportunity to participate in the care of [name].  Laureen Ochs. Bernarda Caffey [facility] [phone] 9/21/20238:36 AM

## 2021-12-19 ENCOUNTER — Telehealth: Payer: Self-pay | Admitting: Gastroenterology

## 2021-12-19 ENCOUNTER — Other Ambulatory Visit: Payer: Self-pay | Admitting: *Deleted

## 2021-12-19 DIAGNOSIS — D649 Anemia, unspecified: Secondary | ICD-10-CM | POA: Diagnosis not present

## 2021-12-19 LAB — CBC
HCT: 29.4 % — ABNORMAL LOW (ref 36.0–46.0)
Hemoglobin: 9.4 g/dL — ABNORMAL LOW (ref 12.0–15.0)
MCH: 28.7 pg (ref 26.0–34.0)
MCHC: 32 g/dL (ref 30.0–36.0)
MCV: 89.9 fL (ref 80.0–100.0)
Platelets: 267 10*3/uL (ref 150–400)
RBC: 3.27 MIL/uL — ABNORMAL LOW (ref 3.87–5.11)
RDW: 15.3 % (ref 11.5–15.5)
WBC: 9.2 10*3/uL (ref 4.0–10.5)
nRBC: 0.2 % (ref 0.0–0.2)

## 2021-12-19 LAB — GLUCOSE, CAPILLARY
Glucose-Capillary: 153 mg/dL — ABNORMAL HIGH (ref 70–99)
Glucose-Capillary: 168 mg/dL — ABNORMAL HIGH (ref 70–99)
Glucose-Capillary: 200 mg/dL — ABNORMAL HIGH (ref 70–99)

## 2021-12-19 MED ORDER — FERROUS SULFATE 325 (65 FE) MG PO TABS: 325.0000 mg | ORAL_TABLET | Freq: Every day | ORAL | 3 refills | Status: DC

## 2021-12-19 MED ORDER — APIXABAN 5 MG PO TABS: 5.0000 mg | ORAL_TABLET | Freq: Two times a day (BID) | ORAL | 3 refills | Status: AC

## 2021-12-19 MED ORDER — PANTOPRAZOLE SODIUM 40 MG PO TBEC: 40.0000 mg | DELAYED_RELEASE_TABLET | Freq: Two times a day (BID) | ORAL | 4 refills | Status: DC

## 2021-12-19 NOTE — Telephone Encounter (Signed)
Please arrange for CBC in 1 week. Dx: anemia  Venetia Night, MSN, APRN, FNP-BC, AGACNP-BC [facility]

## 2021-12-19 NOTE — TOC Transition Note (Signed)
Transition of Care [facility]) - CM/SW Discharge Note   Patient Details  Name: [name] MRN: [id number] Date of Birth: [birth year]  Transition of Care [facility]) CM/SW Contact:  Ihor Gully, LCSW Phone Number: 12/19/2021, 11:14 AM   Clinical Narrative:    D/c clinicals sent to facility. Legal guardian notified. Facility to pick patient up. TOC signing off.    Final next level of care: Assisted Living Barriers to Discharge: No Barriers Identified   Patient Goals and CMS Choice Patient states their goals for this hospitalization and ongoing recovery are:: return to facility      Discharge Placement                       Discharge Plan and Services                          HH Arranged: PT          Social Determinants of Health (SDOH) Interventions Housing Interventions: Intervention Not Indicated   Readmission Risk Interventions     No data to display

## 2021-12-19 NOTE — Anesthesia Postprocedure Evaluation (Signed)
Anesthesia Post Note  Patient: [name]  Procedure(s) Performed: ESOPHAGOGASTRODUODENOSCOPY (EGD) WITH PROPOFOL HOT HEMOSTASIS (ARGON PLASMA COAGULATION/BICAP)  Patient location during evaluation: Phase II Anesthesia Type: General Level of consciousness: awake Pain management: pain level controlled Vital Signs Assessment: post-procedure vital signs reviewed and stable Respiratory status: spontaneous breathing and respiratory function stable Cardiovascular status: blood pressure returned to baseline and stable Postop Assessment: no headache and no apparent nausea or vomiting Anesthetic complications: no Comments: Late entry   No notable events documented.   Last Vitals:  Vitals:   12/18/21 2152 12/19/21 0611  BP: (!) 136/90 (!) 146/80  Pulse: 74 79  Resp: 20 20  Temp: 36.7 C 36.7 C  SpO2: 100% 97%    Last Pain:  Vitals:   12/19/21 1610  TempSrc: Oral  PainSc:                  Louann Sjogren

## 2021-12-19 NOTE — Discharge Instructions (Signed)
1) okay to restart apixaban/Eliquis on Saturday, 12/20/2021 as long as no further bleeding is noted 2) please take Protonix 40 mg twice daily 3)Avoid ibuprofen/Advil/Aleve/Motrin/Goody Powders/Naproxen/BC powders/Meloxicam/Diclofenac/Indomethacin and other Nonsteroidal anti-inflammatory medications as these will make you more likely to bleed and can cause stomach ulcers, can also cause Kidney problems.  4) please repeat CBC and BMP blood test on Tuesday, 12/23/2021 5) please follow-up with gastroenterologist for management of presumed liver cirrhosis and gastrointestinal bleeding Follow-up Gastroenterologist Dr. Hurshel Keys with [facility]--- for evaluation  -address: [address], [place], [place] [address], Phone: [phone] 6) you will need liver ultrasound to further evaluate your liver due to concerns about possible liver cirrhosis--please follow-up with gastroenterologist to discuss this further

## 2021-12-19 NOTE — Discharge Summary (Signed)
[name], is a 66 y.o. female  DOB 08/05/1955  MRN [id number].  Admission date:  12/17/2021  Admitting Physician  Roxan Hockey, MD  Discharge Date:  12/19/2021   Primary MD  Carrolyn Meiers, MD  Recommendations for primary care physician for things to follow:  1) okay to restart apixaban/Eliquis on Saturday, 12/20/2021 as long as no further bleeding is noted 2) please take Protonix 40 mg twice daily 3)Avoid ibuprofen/Advil/Aleve/Motrin/Goody Powders/Naproxen/BC powders/Meloxicam/Diclofenac/Indomethacin and other Nonsteroidal anti-inflammatory medications as these will make you more likely to bleed and can cause stomach ulcers, can also cause Kidney problems.  4) please repeat CBC and BMP blood test on Tuesday, 12/23/2021 5) please follow-up with gastroenterologist for management of presumed liver cirrhosis and gastrointestinal bleeding Follow-up Gastroenterologist Dr. Hurshel Keys with [facility]--- for evaluation  -address: [address], [place], [place] [address], Phone: [phone] 6) you will need liver ultrasound to further evaluate your liver due to concerns about possible liver cirrhosis--please follow-up with gastroenterologist to discuss this further  Admission Diagnosis  Acute blood loss anemia [D62] Gastrointestinal hemorrhage, unspecified gastrointestinal hemorrhage type [K92.2] Acute on chronic anemia [D64.9] Acute GI bleeding [K92.2]   Discharge Diagnosis  Acute blood loss anemia [D62] Gastrointestinal hemorrhage, unspecified gastrointestinal hemorrhage type [K92.2] Acute on chronic anemia [D64.9] Acute GI bleeding [K92.2]    Principal Problem:   Acute on chronic anemia Active Problems:   Iron deficiency anemia   Essential hypertension, benign   Type 2 diabetes mellitus with stage 4 chronic kidney disease, with long-term current use of insulin  (HCC)   Chronic diastolic HF (heart failure) (HCC)   COPD (chronic obstructive pulmonary disease) (HCC)   Gastrointestinal hemorrhage   Acute blood loss anemia   High risk medication use   Acute GI bleeding      Past Medical History:  Diagnosis Date   Arthritis    Asthma    Atrial fibrillation ([place])    Carcinoid tumor determined by biopsy of lung 05/2018   Left lung   Chronic diastolic CHF (congestive heart failure) ([place])    CKD (chronic kidney disease), stage III ([place])    Diabetes mellitus type 2 in obese [facility])    Essential hypertension    Iron deficiency anemia 10/16/2010   Mental handicap 10/16/2010   Mild CAD 2013   Morbid obesity ([place])    OSA (obstructive sleep apnea)     Past Surgical History:  Procedure Laterality Date   ABDOMINAL HYSTERECTOMY     AV FISTULA PLACEMENT Right 06/06/2018   Procedure: ARTERIOVENOUS (AV) FISTULA CREATION;  Surgeon: Marty Heck, MD;  Location: [place];  Service: Vascular;  Laterality: Right;   BIOPSY  10/16/2021   Procedure: BIOPSY;  Surgeon: Daneil Dolin, MD;  [facility];  Service: Endoscopy;;   BRONCHIAL DILITATION  12/13/2020   Procedure: BRONCHIAL DILITATION;  Surgeon: Garner Nash, DO;  [facility];  Service: Pulmonary;;   CESAREAN SECTION     CHOLECYSTECTOMY     COLONOSCOPY  08/2010   normal TI, sigmoid polyp (adenoma ). Next TCS due  08/2015,   COLONOSCOPY WITH PROPOFOL N/A 07/08/2020   Procedure: COLONOSCOPY WITH PROPOFOL;  Surgeon: Daneil Dolin, MD;  [facility];  Service: Endoscopy;  Laterality: N/A;  AM (diabetic and facility patient)   ESOPHAGOGASTRODUODENOSCOPY  08/2010   antral and duodenal erosions s/p bx (chronic gastritis, no h.pylori, no celiac dz ), hiatal hernia   ESOPHAGOGASTRODUODENOSCOPY (EGD) WITH PROPOFOL N/A 10/16/2021   Procedure: ESOPHAGOGASTRODUODENOSCOPY (EGD) WITH PROPOFOL;  Surgeon: Daneil Dolin, MD;  [facility];  Service: Endoscopy;  Laterality:  N/A;  11:45am   FISTULA SUPERFICIALIZATION Right 08/25/2018   Procedure: FISTULA SUPERFICIALIZATION RIGHT ARM;  Surgeon: Waynetta Sandy, MD;  Location: [place];  Service: Cardiovascular;  Laterality: Right;   IR FLUORO GUIDE CV LINE RIGHT  05/31/2018   IR US GUIDE VASC ACCESS RIGHT  05/31/2018   KNEE SURGERY     right knee @ 66 years of age   LEFT AND RIGHT HEART CATHETERIZATION WITH CORONARY ANGIOGRAM N/A 09/21/2011   Procedure: LEFT AND RIGHT HEART CATHETERIZATION WITH CORONARY ANGIOGRAM;  Surgeon: Birdie Riddle, MD;  Location: [facility];  Service: Cardiovascular;  Laterality: N/A;   POLYPECTOMY  07/08/2020   Procedure: POLYPECTOMY INTESTINAL;  Surgeon: Daneil Dolin, MD;  [facility];  Service: Endoscopy;;   VIDEO BRONCHOSCOPY Left 12/13/2020   Procedure: VIDEO BRONCHOSCOPY WITHOUT FLUORO;  Surgeon: Garner Nash, DO;  Location: [facility];  Service: Pulmonary;  Laterality: Left;  Cryotherapy    HPI  from the history and physical done on the day of admission:     Chief Complaint: Dizziness   HPI: [name] is a 66 y.o. female with medical history significant for COPD, diastolic CHF, diabetes mellitus, hypertension, CKD 3 fibrillation on anticoagulation. Patient presented to ED with complaints of dizziness of 1 week.  Dizziness is present with standing.  She denies difficulty breathing, no chest pain.  She reports over the past 2 days her stools have been darker than usual.  But she has not had any blood in stools or vomiting.  She has been compliant with her Eliquis.  She denies NSAID use. She has been having a productive cough over the past 2 weeks.  No difficulty breathing.  She reports she has been wheezing, but she intermittently has this.  No fevers no chills.  No leg swelling.  Saw outpatient provider she was prescribed amoxicillin 9/6 she reports compliance with it.  She has since had persistent cough.   ED Course: Blood pressure 1 12-1 37.  Stable  vitals.  Hemoglobin down to 4.8.  Creatinine at baseline 2.29.  Chest x-ray clear. EDP provider talked to Dr. [name], recommend admission, will see in consult.  N.p.o. after midnight, continue PPI.   Review of Systems: As per HPI all other systems reviewed and negative.   Hospital Course:   Brief Narrative:  66 y.o. female with medical history significant for COPD, diastolic CHF, diabetes mellitus, hypertension, CKD 3 fibrillation on anticoagulation admitted on 12/17/2021 with acute on chronic symptomatic anemia with hemoglobin down to 4.8     -Assessment and Plan: * Acute on chronic symptomatic anemia --Repeat EGD on 12/18/2021 suggest early GAVE, 1 portion was actively oozing (Gastric antral vascular ectasia with bleeding, Treated with argon plasma coagulation (APC).).   -she has a CT chest from 2020 which did mention cirrhosis.   -GI recommends holding Eliquis through 12/20/2021 -Treated  with IV Protonix Hgb up to 9.4 from 4.8 after transfusion of 3 units of PRBC -Follow-up with GI as outpatient -Repeat CBC on 12/23/2021 -Avoid NSAIDs -Discharge on twice daily oral Protonix   COPD (chronic obstructive pulmonary disease) ([place]) -Chest x-ray without definite pneumonia -Continue bronchodilators and mucolytics   Chronic diastolic HF (heart failure) (HCC) Stable and compensated.  Last echo 2020 EF of 65%.  On Lasix and metolazone med list.  Patient does not know what medications she takes.  BNP elevated at 461, compared to 125 from 3 years ago....  -Continue  Lasix   CKD stage - IV   -Renal function appears close to baseline at this time -Repeat BMP on 12/23/2021 - renally adjust medications, avoid nephrotoxic agents / dehydration  / hypotension   Type 2 diabetes mellitus with stage 4 chronic kidney disease, with long-term current use of insulin (HCC) .--A1c is 5.1 reflecting excellent diabetic control PTA -Resume home insulin - resume kerendia   Essential hypertension,  benign Stable. -Resume diltiazem  Imdur, metoprolol, losartan   Morbid Obesity- -Low calorie diet, portion control and increase physical activity discussed with patient -Body mass index is 47.58 kg/m.  Disposition: The patient is from: [facility]              Anticipated d/c is to: [facility]  Discharge Condition: stable  Follow UP   Follow-up Information     [name], DO. Call on 12/23/2021.   Specialty: Gastroenterology Contact information: [address] [place] [address] [phone]                 Consults obtained - Gi  Diet and Activity recommendation:  As advised  Discharge Instructions    Discharge Instructions     Call MD for:  difficulty breathing, headache or visual disturbances   Complete by: As directed    Call MD for:  persistant dizziness or light-headedness   Complete by: As directed    Call MD for:  persistant nausea and vomiting   Complete by: As directed    Call MD for:  temperature >100.4   Complete by: As directed    Diet - low sodium heart healthy   Complete by: As directed    Discharge instructions   Complete by: As directed    1) okay to restart apixaban/Eliquis on Saturday, 12/20/2021 as long as no further bleeding is noted 2) please take Protonix 40 mg twice daily 3)Avoid ibuprofen/Advil/Aleve/Motrin/Goody Powders/Naproxen/BC powders/Meloxicam/Diclofenac/Indomethacin and other Nonsteroidal anti-inflammatory medications as these will make you more likely to bleed and can cause stomach ulcers, can also cause Kidney problems.  4) please repeat CBC and BMP blood test on Tuesday, 12/23/2021 5) please follow-up with gastroenterologist for management of presumed liver cirrhosis and gastrointestinal bleeding Follow-up Gastroenterologist Dr. Hurshel Keys with [facility]--- for evaluation  -address: [address] [place], [place] [address], Phone: [phone] 6) you will need liver ultrasound to  further evaluate your liver due to concerns about possible liver cirrhosis--please follow-up with gastroenterologist to discuss this further   Increase activity slowly   Complete by: As directed         Discharge Medications     Allergies as of 12/19/2021   No Known Allergies      Medication List     STOP taking these medications    amoxicillin 500 MG capsule Commonly known as: AMOXIL       TAKE these medications    acetaminophen 325 MG  tablet Commonly known as: TYLENOL Take 325 mg by mouth every 12 (twelve) hours as needed for moderate pain.   albuterol 108 (90 Base) MCG/ACT inhaler Commonly known as: VENTOLIN HFA Inhale 2 puffs into the lungs every 4 (four) hours as needed for shortness of breath.   apixaban 5 MG Tabs tablet Commonly known as: ELIQUIS Take 1 tablet (5 mg total) by mouth 2 (two) times daily. Start taking on: December 20, 2021   benzonatate 100 MG capsule Commonly known as: TESSALON Take 100 mg by mouth 2 (two) times daily as needed for cough.   betamethasone dipropionate 0.05 % ointment Commonly known as: DIPROLENE Apply 1 application  topically 2 (two) times daily as needed (for rash under breasts).   calcitRIOL 0.25 MCG capsule Commonly known as: ROCALTROL Take 0.25 mcg by mouth every Monday, Wednesday, and Friday.   cetirizine 10 MG tablet Commonly known as: ZYRTEC Take 10 mg by mouth daily.   clotrimazole 1 % cream Commonly known as: LOTRIMIN Apply 1 application  topically 2 (two) times daily as needed (for rash under breasts).   diltiazem 240 MG 24 hr capsule Commonly known as: CARDIZEM CD Take 1 capsule (240 mg total) by mouth daily.   DropSafe Safety Pen Needles 31G X 6 MM Misc Generic drug: Insulin Pen Needle   EasyMax Test test strip Generic drug: glucose blood CHECK BLOOD SUGAR 4 TIMES A DAY. (BREAKFAST, LUNCH, SUPPER & BEDTIME)   ferrous sulfate 325 (65 FE) MG tablet Take 1 tablet (325 mg total) by mouth daily with  breakfast. Start taking on: December 20, 2021   fluticasone 50 MCG/ACT nasal spray Commonly known as: FLONASE Place 2 sprays into both nostrils daily as needed for allergies.   furosemide 40 MG tablet Commonly known as: LASIX Take 20 mg by mouth See admin instructions. Take 20 mg twice daily, alternating with 20 mg daily every other day   furosemide 20 MG tablet Commonly known as: LASIX Take 20 mg by mouth See admin instructions. Take 20 mg twice a day alternating with 20 mg once daily   Geri-Tussin DM 10-100 MG/5ML liquid Generic drug: Dextromethorphan-guaiFENesin Take by mouth.   guaiFENesin 600 MG 12 hr tablet Commonly known as: Mucinex Take 1 tablet (600 mg total) by mouth 2 (two) times daily as needed.   HumuLIN R U-500 KwikPen 500 UNIT/ML KwikPen Generic drug: insulin regular human CONCENTRATED INJECT 40 UNITS SUBCUTANEOUSLY AT BREAKFAST; INJECT 30 UNITS AT LUNCH & SUPPER; WHEN GLUCOSE IS ABOVE 90 & EATING.(HOLD IF BS BELOW 70: CALL MD IF BS ABOVE 400 What changed: See the new instructions.   hydrOXYzine 25 MG tablet Commonly known as: ATARAX Take 25 mg by mouth 3 (three) times daily.   ipratropium-albuterol 0.5-2.5 (3) MG/3ML Soln Commonly known as: DUONEB Take 3 mLs by nebulization every 6 (six) hours as needed (for cough).   isosorbide dinitrate 10 MG tablet Commonly known as: ISORDIL Take 1 tablet (10 mg total) by mouth 3 (three) times daily.   Kerendia 10 MG Tabs Generic drug: Finerenone Take 10 mg by mouth daily.   Lancets 28G Misc Use to test BG qid. E11.65   loperamide 2 MG capsule Commonly known as: IMODIUM Take 2-4 mg by mouth See admin instructions. Take 4 mg by mouth now: then 2 mg after each loose stool as needed, do not exceed 5 doses in 24 hours.   losartan 25 MG tablet Commonly known as: COZAAR Take 12.5 mg by mouth daily.   metolazone 5  MG tablet Commonly known as: ZAROXOLYN Take 5 mg by mouth in the morning.   metoprolol tartrate 50  MG tablet Commonly known as: LOPRESSOR Take 50 mg by mouth 2 (two) times daily.   montelukast 10 MG tablet Commonly known as: SINGULAIR Take 10 mg by mouth at bedtime.   pantoprazole 40 MG tablet Commonly known as: PROTONIX Take 1 tablet (40 mg total) by mouth 2 (two) times daily. What changed: when to take this   polyethylene glycol powder 17 GM/SCOOP powder Commonly known as: GLYCOLAX/MIRALAX Take 17 g by mouth daily as needed for mild constipation or moderate constipation.   potassium chloride SA 20 MEQ tablet Commonly known as: KLOR-CON M Take 20 mEq by mouth daily.   rosuvastatin 20 MG tablet Commonly known as: CRESTOR TAKE (1) TABLET BY MOUTH ONCE DAILY. What changed: See the new instructions.   Trelegy Ellipta 100-62.5-25 MCG/ACT Aepb Generic drug: Fluticasone-Umeclidin-Vilant Inhale 1 puff into the lungs daily.   triamcinolone cream 0.5 % Commonly known as: KENALOG Apply 1 application topically every 12 (twelve) hours as needed (applied to bilateral legs for allergic dermatitis).   Vitamin D3 50 MCG (2000 UT) Tabs TAKE 1 TABLET BY MOUTH ONCE DAILY. What changed: Another medication with the same name was changed. Make sure you understand how and when to take each.   Vitamin D (Cholecalciferol) 25 MCG (1000 UT) Tabs TAKE 1 TABLET BY MOUTH ONCE DAILY. What changed: how much to take       Major procedures and Radiology Reports - PLEASE review detailed and final reports for all details, in brief -   DG Chest Portable 1 View  Result Date: 12/17/2021 CLINICAL DATA:  Cough, chest pain. EXAM: PORTABLE CHEST 1 VIEW COMPARISON:  May 07, 2020. FINDINGS: Stable cardiomegaly. The heart size and mediastinal contours are within normal limits. Both lungs are clear. The visualized skeletal structures are unremarkable. IMPRESSION: No active disease. Electronically Signed   By: Marijo Conception M.D.   On: 12/17/2021 15:38    Micro Results   Recent Results (from the past  240 hour(s))  SARS Coronavirus 2 by RT PCR (hospital order, performed in [facility] hospital lab) *cepheid single result test* Anterior Nasal Swab     Status: None   Collection Time: 12/17/21  6:40 PM   Specimen: Anterior Nasal Swab  Result Value Ref Range Status   SARS Coronavirus 2 by RT PCR NEGATIVE NEGATIVE Final    Comment: (NOTE) SARS-CoV-2 target nucleic acids are NOT DETECTED.  The SARS-CoV-2 RNA is generally detectable in upper and lower respiratory specimens during the acute phase of infection. The lowest concentration of SARS-CoV-2 viral copies this assay can detect is 250 copies / mL. A negative result does not preclude SARS-CoV-2 infection and should not be used as the sole basis for treatment or other patient management decisions.  A negative result may occur with improper specimen collection / handling, submission of specimen other than nasopharyngeal swab, presence of viral mutation(s) within the areas targeted by this assay, and inadequate number of viral copies (<250 copies / mL). A negative result must be combined with clinical observations, patient history, and epidemiological information.  Fact Sheet for Patients:   https://www.patel.info/  Fact Sheet for Healthcare Providers: https://hall.com/  This test is not yet approved or  cleared by the [place] FDA and has been authorized for detection and/or diagnosis of SARS-CoV-2 by FDA under an Emergency Use Authorization (EUA).  This EUA will remain in effect (meaning this  test can be used) for the duration of the COVID-19 declaration under Section 564(b)(1) of the Act, 21 U.S.C. section 360bbb-3(b)(1), unless the authorization is terminated or revoked sooner.  Performed at [facility], [address] [place], [place] [address]     Today   Subjective    Odelle Kosier today has no new concerns -Tolerating soft diet well No nausea no vomiting no chest pain  or palpitations no dyspnea on exertion no dizziness   Patient has been seen and examined prior to discharge   Objective   Blood pressure (!) 146/80, pulse 79, temperature 98 F (36.7 C), temperature source Oral, resp. rate 20, height 5\' 2"  (1.575 m), weight 118 kg, SpO2 97 %.   Intake/Output Summary (Last 24 hours) at 12/19/2021 1032 Last data filed at 12/19/2021 0900 Gross per 24 hour  Intake 968 ml  Output 1300 ml  Net -332 ml    Exam Gen:- Awake Alert, no acute distress  HEENT:- [place].AT, No sclera icterus Neck-Supple Neck,No JVD,.  Lungs-  CTAB , good air movement bilaterally CV- S1, S2 normal, regular Abd-  +ve B.Sounds, Abd Soft, No tenderness, increased truncal adiposity noted Extremity/Skin:- No  edema,   good pulses Psych-affect is appropriate, oriented x3 Neuro-no new focal deficits, no tremors    Data Review   CBC w Diff:  Lab Results  Component Value Date   WBC 9.2 12/19/2021   HGB 9.4 (L) 12/19/2021   HGB 11.1 (L) 07/28/2021   HCT 29.4 (L) 12/19/2021   PLT 267 12/19/2021   PLT 352 07/28/2021   LYMPHOPCT 19 07/28/2021   MONOPCT 7 07/28/2021   EOSPCT 4 07/28/2021   BASOPCT 1 07/28/2021    CMP:  Lab Results  Component Value Date   NA 136 12/18/2021   NA 142 11/19/2020   K 3.5 12/18/2021   CL 98 12/18/2021   CO2 26 12/18/2021   BUN 60 (H) 12/18/2021   BUN 46 (H) 11/19/2020   CREATININE 2.21 (H) 12/18/2021   CREATININE 1.56 (H) 07/28/2021   CREATININE 1.57 (H) 09/12/2019   GLU 169 05/07/2020   PROT 7.1 07/28/2021   PROT 6.6 11/19/2020   ALBUMIN 3.5 07/28/2021   ALBUMIN 3.7 (L) 11/19/2020   BILITOT 0.4 07/28/2021   ALKPHOS 79 07/28/2021   AST 13 (L) 07/28/2021   ALT 27 07/28/2021  . Total Discharge time is about 33 minutes  Roxan Hockey M.D on 12/19/2021 at 10:32 AM  Go to [email or web address] -  for contact info  [facility] - Office  [phone]

## 2021-12-19 NOTE — Progress Notes (Signed)
Patient slept on and off throughout the night, she did wake a few times to go to the restroom with assistance. She mentioned discomfort in her hip area that resolved with ambulation in room. Patient received 1 unit PRBC yesterday evening and tolerated well. She denies pain and discomfort at this time.

## 2021-12-19 NOTE — Telephone Encounter (Signed)
Please arrange hospital follow up in 3-4 weeks for upper GI bleed, preferably with Magda Paganini or [place] who she has seen before.   Venetia Night, MSN, APRN, FNP-BC, AGACNP-BC [facility]

## 2021-12-19 NOTE — Telephone Encounter (Signed)
Labs entered into Epic, notified pt.

## 2021-12-19 NOTE — NC FL2 (Signed)
[place] LEVEL OF CARE SCREENING TOOL     IDENTIFICATION  Patient Name: [name] Birthdate: 02-Feb-1956 Sex: female Admission Date (Current Location): 12/17/2021  [place] and [place] Number:  [facility] [id number] [place] and Address:  [facility] [address], [place]      Provider Number: [id number]  Attending Physician Name and Address:  Roxan Hockey, MD  Relative Name and Phone Number:  Amos,Katie (Legal Guardian)   [phone]    Current Level of Care: Hospital Recommended Level of Care: [place] Prior Approval Number:    Date Approved/Denied:   PASRR Number:    Discharge Plan: Domiciliary (Rest home) ([facility])    Current Diagnoses: Patient Active Problem List   Diagnosis Date Noted   Acute GI bleeding 12/18/2021   Gastrointestinal hemorrhage    Acute blood loss anemia    High risk medication use    Acute on chronic anemia 12/17/2021   COPD (chronic obstructive pulmonary disease) ([place]) 12/17/2021   Malignant carcinoid tumor of lung ([place])    Endobronchial cancer, left ([place]) 11/15/2020   Airway obstruction 11/15/2020   Encounter for screening colonoscopy 05/08/2020   H/O adenomatous polyp of colon 05/08/2020   Chronic diastolic HF (heart failure) ([place])    Symptomatic bradycardia 09/19/2019   ESRD on dialysis ([place]) 08/23/2018   Carcinoid tumor determined by biopsy of lung    Aortic valve mass    Respiratory arrest ([place]) 05/21/2018   Acute renal failure (ARF) ([place]) 05/21/2018   CAP (community acquired pneumonia) 05/13/2018   Mixed hyperlipidemia 11/23/2017   Hypercortisolemia ([place]) 04/27/2016   Morbid obesity due to excess calories ([place]) 07/02/2015   Type 2 diabetes mellitus with stage 4 chronic kidney disease, with long-term current use of insulin ([place]) 02/26/2015   Acute kidney injury superimposed on chronic kidney disease ([place]) 10/07/2011   Cutaneous candidiasis 10/21/2010    Essential hypertension, benign 10/21/2010   Iron deficiency anemia 10/16/2010   Mental handicap 10/16/2010    Orientation RESPIRATION BLADDER Height & Weight     Self, Time, Situation, Place  Normal Continent, Incontinent Weight: 260 lb 2.3 oz (118 kg) Height:  5\' 2"  (157.5 cm)  BEHAVIORAL SYMPTOMS/MOOD NEUROLOGICAL BOWEL NUTRITION STATUS      Continent, Incontinent Diet (low sodium hh at hospital. Facility has her on reduced concentrated sweets. Per facility their regular diet is low sodium HH.)  AMBULATORY STATUS COMMUNICATION OF NEEDS Skin   Limited Assist   Normal                       Personal Care Assistance Level of Assistance  Bathing, Feeding, Dressing Bathing Assistance: Limited assistance Feeding assistance: Independent Dressing Assistance: Limited assistance     Functional Limitations Info  Sight, Hearing, Speech Sight Info: Adequate (glasses) Hearing Info: Adequate Speech Info: Adequate    SPECIAL CARE FACTORS FREQUENCY  PT (By licensed PT)     PT Frequency: 3x/week              Contractures Contractures Info: Not present    Additional Factors Info  Code Status, Allergies Code Status Info: Full code Allergies Info: NKA           Current Medications (12/19/2021):  This is the current hospital active medication list Current Facility-Administered Medications  Medication Dose Route Frequency Provider Last Rate Last Admin   acetaminophen (TYLENOL) tablet 650 mg  650 mg Oral Q6H PRN Emokpae, Leanne Chang, MD  650 mg at 12/18/21 2155   Or   acetaminophen (TYLENOL) suppository 650 mg  650 mg Rectal Q6H PRN Emokpae, Ejiroghene E, MD       calcitRIOL (ROCALTROL) capsule 0.25 mcg  0.25 mcg Oral Q M,W,F Emokpae, Ejiroghene E, MD   0.25 mcg at 12/19/21 8466   Chlorhexidine Gluconate Cloth 2 % PADS 6 each  6 each Topical Daily Roxan Hockey, MD   6 each at 12/19/21 0940   diltiazem (CARDIZEM CD) 24 hr capsule 240 mg  240 mg Oral Daily Emokpae,  Ejiroghene E, MD   240 mg at 12/19/21 0936   ferrous sulfate tablet 325 mg  325 mg Oral Q breakfast Emokpae, Courage, MD   325 mg at 12/19/21 0936   Finerenone TABS 10 mg  10 mg Oral Daily Emokpae, Ejiroghene E, MD       insulin aspart (novoLOG) injection 0-15 Units  0-15 Units Subcutaneous Q6H Emokpae, Ejiroghene E, MD   3 Units at 12/19/21 5993   ipratropium-albuterol (DUONEB) 0.5-2.5 (3) MG/3ML nebulizer solution 3 mL  3 mL Nebulization Q6H PRN Emokpae, Ejiroghene E, MD       ipratropium-albuterol (DUONEB) 0.5-2.5 (3) MG/3ML nebulizer solution 3 mL  3 mL Nebulization Q4H PRN Emokpae, Ejiroghene E, MD       isosorbide dinitrate (ISORDIL) tablet 10 mg  10 mg Oral TID Emokpae, Ejiroghene E, MD   10 mg at 12/19/21 0935   losartan (COZAAR) tablet 12.5 mg  12.5 mg Oral Daily Emokpae, Ejiroghene E, MD   12.5 mg at 12/19/21 0936   metoprolol tartrate (LOPRESSOR) tablet 50 mg  50 mg Oral BID Emokpae, Ejiroghene E, MD   50 mg at 12/19/21 0936   montelukast (SINGULAIR) tablet 10 mg  10 mg Oral QHS Emokpae, Ejiroghene E, MD   10 mg at 12/18/21 2014   ondansetron (ZOFRAN) tablet 4 mg  4 mg Oral Q6H PRN Emokpae, Ejiroghene E, MD       Or   ondansetron (ZOFRAN) injection 4 mg  4 mg Intravenous Q6H PRN Emokpae, Ejiroghene E, MD       pantoprazole (PROTONIX) injection 40 mg  40 mg Intravenous Q12H Emokpae, Courage, MD   40 mg at 12/19/21 0935   polyethylene glycol (MIRALAX / GLYCOLAX) packet 17 g  17 g Oral Daily PRN Emokpae, Ejiroghene E, MD       rosuvastatin (CRESTOR) tablet 20 mg  20 mg Oral Daily Emokpae, Ejiroghene E, MD   20 mg at 12/19/21 0936     Discharge Medications: TAKE these medications     acetaminophen 325 MG tablet Commonly known as: TYLENOL Take 325 mg by mouth every 12 (twelve) hours as needed for moderate pain.    albuterol 108 (90 Base) MCG/ACT inhaler Commonly known as: VENTOLIN HFA Inhale 2 puffs into the lungs every 4 (four) hours as needed for shortness of breath.    apixaban  5 MG Tabs tablet Commonly known as: ELIQUIS Take 1 tablet (5 mg total) by mouth 2 (two) times daily. Start taking on: December 20, 2021    benzonatate 100 MG capsule Commonly known as: TESSALON Take 100 mg by mouth 2 (two) times daily as needed for cough.    betamethasone dipropionate 0.05 % ointment Commonly known as: DIPROLENE Apply 1 application  topically 2 (two) times daily as needed (for rash under breasts).    calcitRIOL 0.25 MCG capsule Commonly known as: ROCALTROL Take 0.25 mcg by mouth every Monday, Wednesday, and Friday.  cetirizine 10 MG tablet Commonly known as: ZYRTEC Take 10 mg by mouth daily.    clotrimazole 1 % cream Commonly known as: LOTRIMIN Apply 1 application  topically 2 (two) times daily as needed (for rash under breasts).    diltiazem 240 MG 24 hr capsule Commonly known as: CARDIZEM CD Take 1 capsule (240 mg total) by mouth daily.    DropSafe Safety Pen Needles 31G X 6 MM Misc Generic drug: Insulin Pen Needle    EasyMax Test test strip Generic drug: glucose blood CHECK BLOOD SUGAR 4 TIMES A DAY. (BREAKFAST, LUNCH, SUPPER & BEDTIME)    ferrous sulfate 325 (65 FE) MG tablet Take 1 tablet (325 mg total) by mouth daily with breakfast. Start taking on: December 20, 2021    fluticasone 50 MCG/ACT nasal spray Commonly known as: FLONASE Place 2 sprays into both nostrils daily as needed for allergies.    furosemide 40 MG tablet Commonly known as: LASIX Take 20 mg by mouth See admin instructions. Take 20 mg twice daily, alternating with 20 mg daily every other day    furosemide 20 MG tablet Commonly known as: LASIX Take 20 mg by mouth See admin instructions. Take 20 mg twice a day alternating with 20 mg once daily    Geri-Tussin DM 10-100 MG/5ML liquid Generic drug: Dextromethorphan-guaiFENesin Take by mouth.    guaiFENesin 600 MG 12 hr tablet Commonly known as: Mucinex Take 1 tablet (600 mg total) by mouth 2 (two) times daily as needed.     HumuLIN R U-500 KwikPen 500 UNIT/ML KwikPen Generic drug: insulin regular human CONCENTRATED INJECT 40 UNITS SUBCUTANEOUSLY AT BREAKFAST; INJECT 30 UNITS AT LUNCH & SUPPER; WHEN GLUCOSE IS ABOVE 90 & EATING.(HOLD IF BS BELOW 70: CALL MD IF BS ABOVE 400 What changed: See the new instructions.    hydrOXYzine 25 MG tablet Commonly known as: ATARAX Take 25 mg by mouth 3 (three) times daily.    ipratropium-albuterol 0.5-2.5 (3) MG/3ML Soln Commonly known as: DUONEB Take 3 mLs by nebulization every 6 (six) hours as needed (for cough).    isosorbide dinitrate 10 MG tablet Commonly known as: ISORDIL Take 1 tablet (10 mg total) by mouth 3 (three) times daily.    Kerendia 10 MG Tabs Generic drug: Finerenone Take 10 mg by mouth daily.    Lancets 28G Misc Use to test BG qid. E11.65    loperamide 2 MG capsule Commonly known as: IMODIUM Take 2-4 mg by mouth See admin instructions. Take 4 mg by mouth now: then 2 mg after each loose stool as needed, do not exceed 5 doses in 24 hours.    losartan 25 MG tablet Commonly known as: COZAAR Take 12.5 mg by mouth daily.    metolazone 5 MG tablet Commonly known as: ZAROXOLYN Take 5 mg by mouth in the morning.    metoprolol tartrate 50 MG tablet Commonly known as: LOPRESSOR Take 50 mg by mouth 2 (two) times daily.    montelukast 10 MG tablet Commonly known as: SINGULAIR Take 10 mg by mouth at bedtime.    pantoprazole 40 MG tablet Commonly known as: PROTONIX Take 1 tablet (40 mg total) by mouth 2 (two) times daily. What changed: when to take this    polyethylene glycol powder 17 GM/SCOOP powder Commonly known as: GLYCOLAX/MIRALAX Take 17 g by mouth daily as needed for mild constipation or moderate constipation.    potassium chloride SA 20 MEQ tablet Commonly known as: KLOR-CON M Take 20 mEq by mouth daily.  rosuvastatin 20 MG tablet Commonly known as: CRESTOR TAKE (1) TABLET BY MOUTH ONCE DAILY. What changed: See the new  instructions.    Trelegy Ellipta 100-62.5-25 MCG/ACT Aepb Generic drug: Fluticasone-Umeclidin-Vilant Inhale 1 puff into the lungs daily.    triamcinolone cream 0.5 % Commonly known as: KENALOG Apply 1 application topically every 12 (twelve) hours as needed (applied to bilateral legs for allergic dermatitis).    Vitamin D3 50 MCG (2000 UT) Tabs TAKE 1 TABLET BY MOUTH ONCE DAILY. What changed: Another medication with the same name was changed. Make sure you understand how and when to take each.    Vitamin D (Cholecalciferol) 25 MCG (1000 UT) Tabs TAKE 1 TABLET BY MOUTH ONCE DAILY. What changed: how much to take      Relevant Imaging Results:  Relevant Lab Results:   Additional Information    Jonique Kulig, Clydene Pugh, LCSW

## 2021-12-21 LAB — BPAM RBC
Blood Product Expiration Date: 202310012359
Blood Product Expiration Date: 202310072359
Blood Product Expiration Date: 202310192359
Blood Product Expiration Date: 202310192359
Blood Product Expiration Date: 202310192359
ISSUE DATE / TIME: 202309201725
ISSUE DATE / TIME: 202309202010
ISSUE DATE / TIME: 202309211836
Unit Type and Rh: 600
Unit Type and Rh: 600
Unit Type and Rh: 6200
Unit Type and Rh: 6200
Unit Type and Rh: 6200

## 2021-12-21 LAB — TYPE AND SCREEN
ABO/RH(D): A POS
Antibody Screen: NEGATIVE
Unit division: 0
Unit division: 0
Unit division: 0
Unit division: 0
Unit division: 0

## 2021-12-22 DIAGNOSIS — M6281 Muscle weakness (generalized): Secondary | ICD-10-CM | POA: Diagnosis not present

## 2021-12-23 DIAGNOSIS — I10 Essential (primary) hypertension: Secondary | ICD-10-CM | POA: Diagnosis not present

## 2021-12-23 DIAGNOSIS — E1122 Type 2 diabetes mellitus with diabetic chronic kidney disease: Secondary | ICD-10-CM | POA: Diagnosis not present

## 2021-12-24 DIAGNOSIS — N184 Chronic kidney disease, stage 4 (severe): Secondary | ICD-10-CM | POA: Diagnosis not present

## 2021-12-24 DIAGNOSIS — I480 Paroxysmal atrial fibrillation: Secondary | ICD-10-CM | POA: Diagnosis not present

## 2021-12-24 DIAGNOSIS — E1165 Type 2 diabetes mellitus with hyperglycemia: Secondary | ICD-10-CM | POA: Diagnosis not present

## 2021-12-24 DIAGNOSIS — I5032 Chronic diastolic (congestive) heart failure: Secondary | ICD-10-CM | POA: Diagnosis not present

## 2021-12-24 DIAGNOSIS — K922 Gastrointestinal hemorrhage, unspecified: Secondary | ICD-10-CM | POA: Diagnosis not present

## 2021-12-24 DIAGNOSIS — Z23 Encounter for immunization: Secondary | ICD-10-CM | POA: Diagnosis not present

## 2021-12-24 DIAGNOSIS — M6281 Muscle weakness (generalized): Secondary | ICD-10-CM | POA: Diagnosis not present

## 2021-12-24 DIAGNOSIS — D649 Anemia, unspecified: Secondary | ICD-10-CM | POA: Diagnosis not present

## 2021-12-25 ENCOUNTER — Encounter (HOSPITAL_COMMUNITY): Payer: Self-pay | Admitting: Internal Medicine

## 2021-12-29 DIAGNOSIS — M6281 Muscle weakness (generalized): Secondary | ICD-10-CM | POA: Diagnosis not present

## 2022-01-01 DIAGNOSIS — M6281 Muscle weakness (generalized): Secondary | ICD-10-CM | POA: Diagnosis not present

## 2022-01-05 DIAGNOSIS — M6281 Muscle weakness (generalized): Secondary | ICD-10-CM | POA: Diagnosis not present

## 2022-01-06 DIAGNOSIS — D631 Anemia in chronic kidney disease: Secondary | ICD-10-CM | POA: Diagnosis not present

## 2022-01-06 DIAGNOSIS — E876 Hypokalemia: Secondary | ICD-10-CM | POA: Diagnosis not present

## 2022-01-06 DIAGNOSIS — M79675 Pain in left toe(s): Secondary | ICD-10-CM | POA: Diagnosis not present

## 2022-01-06 DIAGNOSIS — B351 Tinea unguium: Secondary | ICD-10-CM | POA: Diagnosis not present

## 2022-01-06 DIAGNOSIS — E1122 Type 2 diabetes mellitus with diabetic chronic kidney disease: Secondary | ICD-10-CM | POA: Diagnosis not present

## 2022-01-06 DIAGNOSIS — M79674 Pain in right toe(s): Secondary | ICD-10-CM | POA: Diagnosis not present

## 2022-01-06 DIAGNOSIS — N189 Chronic kidney disease, unspecified: Secondary | ICD-10-CM | POA: Diagnosis not present

## 2022-01-06 DIAGNOSIS — I5032 Chronic diastolic (congestive) heart failure: Secondary | ICD-10-CM | POA: Diagnosis not present

## 2022-01-06 DIAGNOSIS — E211 Secondary hyperparathyroidism, not elsewhere classified: Secondary | ICD-10-CM | POA: Diagnosis not present

## 2022-01-06 DIAGNOSIS — I10 Essential (primary) hypertension: Secondary | ICD-10-CM | POA: Diagnosis not present

## 2022-01-07 ENCOUNTER — Ambulatory Visit: Payer: Medicare Other | Admitting: Internal Medicine

## 2022-01-07 DIAGNOSIS — M6281 Muscle weakness (generalized): Secondary | ICD-10-CM | POA: Diagnosis not present

## 2022-01-08 ENCOUNTER — Other Ambulatory Visit: Payer: Self-pay | Admitting: Hematology

## 2022-01-08 DIAGNOSIS — D3A090 Benign carcinoid tumor of the bronchus and lung: Secondary | ICD-10-CM

## 2022-01-08 NOTE — Progress Notes (Signed)
CT chest ordered.

## 2022-01-12 ENCOUNTER — Other Ambulatory Visit: Payer: Medicare Other

## 2022-01-12 ENCOUNTER — Ambulatory Visit: Payer: Medicare Other | Admitting: Hematology

## 2022-01-12 DIAGNOSIS — M6281 Muscle weakness (generalized): Secondary | ICD-10-CM | POA: Diagnosis not present

## 2022-01-14 ENCOUNTER — Ambulatory Visit: Payer: Medicare Other | Admitting: Internal Medicine

## 2022-01-14 ENCOUNTER — Ambulatory Visit (HOSPITAL_COMMUNITY): Payer: Medicare Other

## 2022-01-14 DIAGNOSIS — E876 Hypokalemia: Secondary | ICD-10-CM | POA: Diagnosis not present

## 2022-01-14 DIAGNOSIS — I5032 Chronic diastolic (congestive) heart failure: Secondary | ICD-10-CM | POA: Diagnosis not present

## 2022-01-14 DIAGNOSIS — M6281 Muscle weakness (generalized): Secondary | ICD-10-CM | POA: Diagnosis not present

## 2022-01-14 DIAGNOSIS — D631 Anemia in chronic kidney disease: Secondary | ICD-10-CM | POA: Diagnosis not present

## 2022-01-14 DIAGNOSIS — N189 Chronic kidney disease, unspecified: Secondary | ICD-10-CM | POA: Diagnosis not present

## 2022-01-14 DIAGNOSIS — R809 Proteinuria, unspecified: Secondary | ICD-10-CM | POA: Diagnosis not present

## 2022-01-14 DIAGNOSIS — E1129 Type 2 diabetes mellitus with other diabetic kidney complication: Secondary | ICD-10-CM | POA: Diagnosis not present

## 2022-01-14 DIAGNOSIS — E1122 Type 2 diabetes mellitus with diabetic chronic kidney disease: Secondary | ICD-10-CM | POA: Diagnosis not present

## 2022-01-14 DIAGNOSIS — K31811 Angiodysplasia of stomach and duodenum with bleeding: Secondary | ICD-10-CM | POA: Diagnosis not present

## 2022-01-14 DIAGNOSIS — D381 Neoplasm of uncertain behavior of trachea, bronchus and lung: Secondary | ICD-10-CM | POA: Diagnosis not present

## 2022-01-14 DIAGNOSIS — E211 Secondary hyperparathyroidism, not elsewhere classified: Secondary | ICD-10-CM | POA: Diagnosis not present

## 2022-01-15 ENCOUNTER — Encounter: Payer: Self-pay | Admitting: Internal Medicine

## 2022-01-15 ENCOUNTER — Ambulatory Visit (INDEPENDENT_AMBULATORY_CARE_PROVIDER_SITE_OTHER): Payer: Medicare Other | Admitting: Internal Medicine

## 2022-01-15 VITALS — BP 135/83 | HR 84 | Temp 97.6°F | Ht 62.0 in | Wt 252.4 lb

## 2022-01-15 DIAGNOSIS — Z86010 Personal history of colonic polyps: Secondary | ICD-10-CM

## 2022-01-15 DIAGNOSIS — K922 Gastrointestinal hemorrhage, unspecified: Secondary | ICD-10-CM | POA: Diagnosis not present

## 2022-01-15 NOTE — Patient Instructions (Signed)
It was good seeing you again today!  As discussed, we will decrease your Protonix to 40 mg once daily 30 minutes before breakfast  We will need to return in 2025 for a repeat colonoscopy due to history of colon polyps  Please provide records of recent CBC done by Dr. Earnest Rosier may or may not have recurrent bleeding.  You may need a repeat endoscopy in the future  We will plan for follow-up here as needed  We will schedule colonoscopy in 2025.

## 2022-01-15 NOTE — Progress Notes (Signed)
Primary Care Physician:  Carrolyn Meiers, MD Primary Gastroenterologist:  Dr.   Pre-Procedure History & Physical: HPI:  [name] is a 66 y.o. female here for follow-up GI bleed.  Prior evaluation at this year demonstrated non-H. pylori gastritis/gastric ectasia of the antrum with active oozing noted in EGD status post APC on Eliquis.  Colonoscopy last year demonstrated 7 adenomas removed; due for surveillance colonoscopy 2025. Has not had any nausea vomiting melena or hematochezia.  Continues on twice daily Protonix.  She tells me her last CBC was "normal" to Dr. Toya Smothers office.   Past Medical History:  Diagnosis Date   Arthritis    Asthma    Atrial fibrillation ([place])    Carcinoid tumor determined by biopsy of lung 05/2018   Left lung   Chronic diastolic CHF (congestive heart failure) ([place])    CKD (chronic kidney disease), stage III ([place])    Diabetes mellitus type 2 in obese [facility])    Essential hypertension    Iron deficiency anemia 10/16/2010   Mental handicap 10/16/2010   Mild CAD 2013   Morbid obesity ([facility])    OSA (obstructive sleep apnea)     Past Surgical History:  Procedure Laterality Date   ABDOMINAL HYSTERECTOMY     AV FISTULA PLACEMENT Right 06/06/2018   Procedure: ARTERIOVENOUS (AV) FISTULA CREATION;  Surgeon: Marty Heck, MD;  Location: [facility];  Service: Vascular;  Laterality: Right;   BIOPSY  10/16/2021   Procedure: BIOPSY;  Surgeon: Daneil Dolin, MD;  [facility];  Service: Endoscopy;;   BRONCHIAL DILITATION  12/13/2020   Procedure: BRONCHIAL DILITATION;  Surgeon: Garner Nash, DO;  Location: [facility];  Service: Pulmonary;;   CESAREAN SECTION     CHOLECYSTECTOMY     COLONOSCOPY  08/2010   normal TI, sigmoid polyp (adenoma ). Next TCS due  08/2015,   COLONOSCOPY WITH PROPOFOL N/A 07/08/2020   Procedure: COLONOSCOPY WITH PROPOFOL;  Surgeon: Daneil Dolin, MD;  [facility];  Service: Endoscopy;  Laterality:  N/A;  AM (diabetic and facility patient)   ESOPHAGOGASTRODUODENOSCOPY  08/2010   antral and duodenal erosions s/p bx (chronic gastritis, no h.pylori, no celiac dz ), hiatal hernia   ESOPHAGOGASTRODUODENOSCOPY (EGD) WITH PROPOFOL N/A 10/16/2021   Procedure: ESOPHAGOGASTRODUODENOSCOPY (EGD) WITH PROPOFOL;  Surgeon: Daneil Dolin, MD;  [facility];  Service: Endoscopy;  Laterality: N/A;  11:45am   ESOPHAGOGASTRODUODENOSCOPY (EGD) WITH PROPOFOL N/A 12/18/2021   Procedure: ESOPHAGOGASTRODUODENOSCOPY (EGD) WITH PROPOFOL;  Surgeon: [name], DO;  [facility];  Service: Endoscopy;  Laterality: N/A;   FISTULA SUPERFICIALIZATION Right 08/25/2018   Procedure: FISTULA SUPERFICIALIZATION RIGHT ARM;  Surgeon: Waynetta Sandy, MD;  Location: [facility];  Service: Cardiovascular;  Laterality: Right;   HOT HEMOSTASIS  12/18/2021   Procedure: HOT HEMOSTASIS (ARGON PLASMA COAGULATION/BICAP);  Surgeon: [name], DO;  [facility];  Service: Endoscopy;;   IR FLUORO GUIDE CV LINE RIGHT  05/31/2018   IR US GUIDE VASC ACCESS RIGHT  05/31/2018   KNEE SURGERY     right knee @ 66 years of age   LEFT AND RIGHT HEART CATHETERIZATION WITH CORONARY ANGIOGRAM N/A 09/21/2011   Procedure: LEFT AND RIGHT HEART CATHETERIZATION WITH CORONARY ANGIOGRAM;  Surgeon: Birdie Riddle, MD;  Location: [facility];  Service: Cardiovascular;  Laterality: N/A;   POLYPECTOMY  07/08/2020   Procedure: POLYPECTOMY INTESTINAL;  Surgeon: Daneil Dolin, MD;  [facility];  Service: Endoscopy;;   VIDEO BRONCHOSCOPY Left 12/13/2020   Procedure: VIDEO BRONCHOSCOPY WITHOUT FLUORO;  Surgeon: Garner Nash, DO;  Location: [facility];  Service: Pulmonary;  Laterality: Left;  Cryotherapy    Prior to Admission medications   Medication Sig Start Date End Date Taking? Authorizing Provider  acetaminophen (TYLENOL) 325 MG tablet Take 325 mg by mouth every 12 (twelve) hours as needed for moderate  pain.    [provider]  albuterol (PROVENTIL HFA;VENTOLIN HFA) 108 (90 Base) MCG/ACT inhaler Inhale 2 puffs into the lungs every 4 (four) hours as needed for shortness of breath.    [provider]  apixaban (ELIQUIS) 5 MG TABS tablet Take 1 tablet (5 mg total) by mouth 2 (two) times daily. 12/20/21   Roxan Hockey, MD  benzonatate (TESSALON) 100 MG capsule Take 100 mg by mouth 2 (two) times daily as needed for cough.    [provider]  betamethasone dipropionate (DIPROLENE) 0.05 % ointment Apply 1 application  topically 2 (two) times daily as needed (for rash under breasts). 08/04/19   [provider]  calcitRIOL (ROCALTROL) 0.25 MCG capsule Take 0.25 mcg by mouth every Monday, Wednesday, and Friday. 08/08/20   [provider]  cetirizine (ZYRTEC) 10 MG tablet Take 10 mg by mouth daily. 06/06/19   [provider]  Cholecalciferol (VITAMIN D3) 50 MCG (2000 UT) TABS TAKE 1 TABLET BY MOUTH ONCE DAILY. 01/02/21   Cassandria Anger, MD  clotrimazole (LOTRIMIN) 1 % cream Apply 1 application  topically 2 (two) times daily as needed (for rash under breasts).    [provider]  diltiazem (CARDIZEM CD) 240 MG 24 hr capsule Take 1 capsule (240 mg total) by mouth daily. 06/10/18   Dessa Phi, DO  DROPSAFE SAFETY PEN NEEDLES 31G X 6 MM MISC  10/03/20   [provider]  EASYMAX TEST test strip CHECK BLOOD SUGAR 4 TIMES A DAY. (BREAKFAST, LUNCH, SUPPER & BEDTIME) 12/15/21   Nida, Marella Chimes, MD  ferrous sulfate 325 (65 FE) MG tablet Take 1 tablet (325 mg total) by mouth daily with breakfast. 12/20/21   Emokpae, Courage, MD  Finerenone (KERENDIA) 10 MG TABS Take 10 mg by mouth daily.    [provider]  fluticasone (FLONASE) 50 MCG/ACT nasal spray Place 2 sprays into both nostrils daily as needed for allergies.    [provider]  Fluticasone-Umeclidin-Vilant (TRELEGY ELLIPTA) 100-62.5-25 MCG/ACT AEPB Inhale 1 puff into  the lungs daily.    [provider]  furosemide (LASIX) 20 MG tablet Take 20 mg by mouth See admin instructions. Take 20 mg twice a day alternating with 20 mg once daily 12/12/21   [provider]  furosemide (LASIX) 40 MG tablet Take 20 mg by mouth See admin instructions. Take 20 mg twice daily, alternating with 20 mg daily every other day 04/20/19   [provider]  GERI-TUSSIN DM 10-100 MG/5ML liquid Take by mouth. 12/16/21   [provider]  guaiFENesin (MUCINEX) 600 MG 12 hr tablet Take 1 tablet (600 mg total) by mouth 2 (two) times daily as needed. 02/20/20   Strader, Fransisco Hertz, PA-C  HUMULIN R U-500 KWIKPEN 500 UNIT/ML KwikPen INJECT 40 UNITS SUBCUTANEOUSLY AT BREAKFAST; INJECT 30 UNITS AT LUNCH & SUPPER; WHEN GLUCOSE IS ABOVE 90 & EATING.(HOLD IF BS BELOW 70: CALL MD IF BS ABOVE 400 Patient taking differently: 30-40 Units See admin instructions. Inject 40 units at breakfast, Inject 30 units at lunch and  supper; when glucose is above 90 and eating (hold if BS below 70: Call MD if BS above 400) 09/19/21   Nida, Marella Chimes, MD  hydrOXYzine (ATARAX) 25 MG tablet Take 25 mg by mouth 3 (three) times daily. 12/16/21   [provider]  ipratropium-albuterol (DUONEB) 0.5-2.5 (3) MG/3ML SOLN Take 3 mLs by nebulization every 6 (six) hours as needed (for cough).    [provider]  isosorbide dinitrate (ISORDIL) 10 MG tablet Take 1 tablet (10 mg total) by mouth 3 (three) times daily. 06/09/18   Dessa Phi, DO  Lancets 28G MISC Use to test BG qid. E11.65 10/07/21   Cassandria Anger, MD  loperamide (IMODIUM) 2 MG capsule Take 2-4 mg by mouth See admin instructions. Take 4 mg by mouth now: then 2 mg after each loose stool as needed, do not exceed 5 doses in 24 hours.    [provider]  losartan (COZAAR) 25 MG tablet Take 12.5 mg by mouth daily.    [provider]  metolazone (ZAROXOLYN) 5 MG tablet Take 5 mg by mouth in the  morning.    [provider]  metoprolol tartrate (LOPRESSOR) 50 MG tablet Take 50 mg by mouth 2 (two) times daily. 05/08/20   [provider]  montelukast (SINGULAIR) 10 MG tablet Take 10 mg by mouth at bedtime.    [provider]  pantoprazole (PROTONIX) 40 MG tablet Take 1 tablet (40 mg total) by mouth 2 (two) times daily. 12/19/21   Roxan Hockey, MD  polyethylene glycol powder (GLYCOLAX/MIRALAX) 17 GM/SCOOP powder Take 17 g by mouth daily as needed for mild constipation or moderate constipation.    [provider]  potassium chloride SA (KLOR-CON M) 20 MEQ tablet Take 20 mEq by mouth daily. 09/11/19   [provider]  rosuvastatin (CRESTOR) 20 MG tablet TAKE (1) TABLET BY MOUTH ONCE DAILY. Patient taking differently: Take 20 mg by mouth daily. 12/20/19   Cassandria Anger, MD  triamcinolone cream (KENALOG) 0.5 % Apply 1 application topically every 12 (twelve) hours as needed (applied to bilateral legs for allergic dermatitis).    [provider]  Vitamin D, Cholecalciferol, 25 MCG (1000 UT) TABS TAKE 1 TABLET BY MOUTH ONCE DAILY. Patient taking differently: Take 2 tablets by mouth daily. 06/16/21   Cassandria Anger, MD    Allergies as of 01/15/2022   (No Known Allergies)    Family History  Problem Relation Age of Onset   Diabetes Mother    Hypertension Mother    Heart failure Mother    Hypertension Father    Atrial fibrillation Father    Hypertension Son    Kidney disease Other        son reportedly has had cyst on kidney and lung s/p surgery at [facility]   Colon cancer Neg Hx     Social History   Socioeconomic History   Marital status: Divorced    Spouse name: Not on file   Number of children: 1   Years of education: Not on file   Highest education level: Not on file  Occupational History    Employer: UNEMPLOYED  Tobacco Use   Smoking status: Former    Packs/day: 1.00    Years: 0.00    Total pack years: 0.00     Types: Cigarettes    Quit date: 04/11/1996    Years since quitting: 25.7   Smokeless tobacco: Never   Tobacco comments:    quit a couple year  ago  Vaping Use   Vaping Use: Never used  Substance and Sexual Activity   Alcohol use: No   Drug use: No   Sexual activity: Never  Other Topics Concern   Not on file  Social History Narrative   Lives with parents.    Social Determinants of Health   Financial Resource Strain: Not on file  Food Insecurity: No Food Insecurity (12/17/2021)   Hunger Vital Sign    Worried About Running Out of Food in the Last Year: Never true    Ran Out of Food in the Last Year: Never true  Transportation Needs: No Transportation Needs (12/17/2021)   PRAPARE - Hydrologist (Medical): No    Lack of Transportation (Non-Medical): No  Physical Activity: Not on file  Stress: Not on file  Social Connections: Not on file  Intimate Partner Violence: Not At Risk (12/17/2021)   Humiliation, Afraid, Rape, and Kick questionnaire    Fear of Current or Ex-Partner: No    Emotionally Abused: No    Physically Abused: No    Sexually Abused: No    Review of Systems: See HPI, otherwise negative ROS  Physical Exam: There were no vitals taken for this visit. General:   Alert,  Well-developed, well-nourished, pleasant and cooperative in NAD Neck:  Supple; no masses or thyromegaly. No significant cervical adenopathy. Lungs:  Clear throughout to auscultation.   No wheezes, crackles, or rhonchi. No acute distress. Heart:  Regular rate and rhythm; no murmurs, clicks, rubs,  or gallops. Abdomen: Obese.  Positive bowel sounds soft nontender obvious mass organomegaly Pulses:  Normal pulses noted. Extremities:  Without clubbing or edema.  Impression/Plan: 66 year old lady with atrial fibrillation on chronic Eliquis with multiple other comorbidities are recently evaluated for recurrent upper GI bleeding felt to be secondary to gastric show dysplasia.   Status post APC.  In the setting of ongoing anticoagulation-she may very well experience recurrent bleeding in the future and may need a repeat EGD as discussed.  Clinical surveillance warranted at this time  We can de-escalate PPI therapy once daily.  Avoid all NSAIDs.  History multiple colonic adenomas removed last year; due for surveillance examination 2025  Recommendations:  As discussed, we will decrease your Protonix to 40 mg once daily 30 minutes before breakfast  We will need to return in 2025 for a repeat colonoscopy due to history of colon polyps  Please provide records of recent CBC done by Dr. Earnest Rosier may or may not have recurrent bleeding.  You may need a repeat endoscopy in the future  We will plan for follow-up here as needed  We will schedule colonoscopy in 2025.   Notice: This dictation was prepared with Dragon dictation along with smaller phrase technology. Any transcriptional errors that result from this process are unintentional and may not be corrected upon review.

## 2022-01-19 ENCOUNTER — Ambulatory Visit: Payer: Medicare Other | Admitting: Gastroenterology

## 2022-01-19 ENCOUNTER — Other Ambulatory Visit: Payer: Self-pay

## 2022-01-19 ENCOUNTER — Ambulatory Visit (HOSPITAL_COMMUNITY)
Admission: RE | Admit: 2022-01-19 | Discharge: 2022-01-19 | Disposition: A | Discharge status: Home / Self Care | Payer: Medicare Other | Source: Ambulatory Visit | Attending: Hematology | Admitting: Hematology

## 2022-01-19 ENCOUNTER — Telehealth: Payer: Self-pay

## 2022-01-19 DIAGNOSIS — K922 Gastrointestinal hemorrhage, unspecified: Secondary | ICD-10-CM

## 2022-01-19 DIAGNOSIS — M6281 Muscle weakness (generalized): Secondary | ICD-10-CM | POA: Diagnosis not present

## 2022-01-19 DIAGNOSIS — D3A090 Benign carcinoid tumor of the bronchus and lung: Secondary | ICD-10-CM | POA: Insufficient documentation

## 2022-01-19 DIAGNOSIS — D509 Iron deficiency anemia, unspecified: Secondary | ICD-10-CM

## 2022-01-19 DIAGNOSIS — C7A090 Malignant carcinoid tumor of the bronchus and lung: Secondary | ICD-10-CM | POA: Diagnosis not present

## 2022-01-19 MED ORDER — PANTOPRAZOLE SODIUM 40 MG PO TBEC: DELAYED_RELEASE_TABLET | ORAL | 3 refills | Status: DC

## 2022-01-19 NOTE — Telephone Encounter (Signed)
[name] from [name] needs Pantoprazole 40 mg once per day sent to [facility]. This was changed from bid at last visit per your note on 01/15/2022. Thanks.

## 2022-01-19 NOTE — Telephone Encounter (Signed)
I sent the prescription to [facility] with the directions on one po 30 minutes prior to breakfast daily, # 90 with 3 refills. Dazey Janus at [facility] made aware.

## 2022-01-21 DIAGNOSIS — M6281 Muscle weakness (generalized): Secondary | ICD-10-CM | POA: Diagnosis not present

## 2022-01-23 DIAGNOSIS — I110 Hypertensive heart disease with heart failure: Secondary | ICD-10-CM | POA: Diagnosis not present

## 2022-01-23 DIAGNOSIS — N184 Chronic kidney disease, stage 4 (severe): Secondary | ICD-10-CM | POA: Diagnosis not present

## 2022-01-26 DIAGNOSIS — M6281 Muscle weakness (generalized): Secondary | ICD-10-CM | POA: Diagnosis not present

## 2022-01-28 DIAGNOSIS — M6281 Muscle weakness (generalized): Secondary | ICD-10-CM | POA: Diagnosis not present

## 2022-01-29 ENCOUNTER — Other Ambulatory Visit: Payer: Self-pay

## 2022-01-29 DIAGNOSIS — D3A090 Benign carcinoid tumor of the bronchus and lung: Secondary | ICD-10-CM

## 2022-01-30 ENCOUNTER — Inpatient Hospital Stay: Payer: Medicare Other | Attending: Hematology

## 2022-01-30 ENCOUNTER — Other Ambulatory Visit: Payer: Self-pay

## 2022-01-30 ENCOUNTER — Inpatient Hospital Stay (HOSPITAL_BASED_OUTPATIENT_CLINIC_OR_DEPARTMENT_OTHER): Payer: Medicare Other | Admitting: Hematology

## 2022-01-30 VITALS — BP 130/69 | HR 67 | Temp 97.7°F | Resp 16 | Wt 252.7 lb

## 2022-01-30 DIAGNOSIS — J45909 Unspecified asthma, uncomplicated: Secondary | ICD-10-CM | POA: Diagnosis not present

## 2022-01-30 DIAGNOSIS — E1122 Type 2 diabetes mellitus with diabetic chronic kidney disease: Secondary | ICD-10-CM | POA: Insufficient documentation

## 2022-01-30 DIAGNOSIS — N183 Chronic kidney disease, stage 3 unspecified: Secondary | ICD-10-CM | POA: Diagnosis not present

## 2022-01-30 DIAGNOSIS — D3A090 Benign carcinoid tumor of the bronchus and lung: Secondary | ICD-10-CM | POA: Diagnosis not present

## 2022-01-30 DIAGNOSIS — I129 Hypertensive chronic kidney disease with stage 1 through stage 4 chronic kidney disease, or unspecified chronic kidney disease: Secondary | ICD-10-CM | POA: Diagnosis not present

## 2022-01-30 DIAGNOSIS — C7A090 Malignant carcinoid tumor of the bronchus and lung: Secondary | ICD-10-CM | POA: Diagnosis not present

## 2022-01-30 DIAGNOSIS — D649 Anemia, unspecified: Secondary | ICD-10-CM | POA: Diagnosis not present

## 2022-01-30 LAB — CMP (CANCER CENTER ONLY)
ALT: 7 U/L (ref 0–44)
AST: 12 U/L — ABNORMAL LOW (ref 15–41)
Albumin: 3.9 g/dL (ref 3.5–5.0)
Alkaline Phosphatase: 74 U/L (ref 38–126)
Anion gap: 8 (ref 5–15)
BUN: 42 mg/dL — ABNORMAL HIGH (ref 8–23)
CO2: 29 mmol/L (ref 22–32)
Calcium: 9.4 mg/dL (ref 8.9–10.3)
Chloride: 99 mmol/L (ref 98–111)
Creatinine: 1.78 mg/dL — ABNORMAL HIGH (ref 0.44–1.00)
GFR, Estimated: 31 mL/min — ABNORMAL LOW (ref >60)
Glucose, Bld: 226 mg/dL — ABNORMAL HIGH (ref 70–99)
Potassium: 3.4 mmol/L — ABNORMAL LOW (ref 3.5–5.1)
Sodium: 136 mmol/L (ref 135–145)
Total Bilirubin: 0.3 mg/dL (ref 0.3–1.2)
Total Protein: 7.6 g/dL (ref 6.5–8.1)

## 2022-01-30 LAB — CBC WITH DIFFERENTIAL (CANCER CENTER ONLY)
Abs Immature Granulocytes: 0.05 10*3/uL (ref 0.00–0.07)
Basophils Absolute: 0 10*3/uL (ref 0.0–0.1)
Basophils Relative: 0 %
Eosinophils Absolute: 0.3 10*3/uL (ref 0.0–0.5)
Eosinophils Relative: 2 %
HCT: 29.5 % — ABNORMAL LOW (ref 36.0–46.0)
Hemoglobin: 9.4 g/dL — ABNORMAL LOW (ref 12.0–15.0)
Immature Granulocytes: 1 %
Lymphocytes Relative: 9 %
Lymphs Abs: 0.9 10*3/uL (ref 0.7–4.0)
MCH: 28.7 pg (ref 26.0–34.0)
MCHC: 31.9 g/dL (ref 30.0–36.0)
MCV: 89.9 fL (ref 80.0–100.0)
Monocytes Absolute: 0.7 10*3/uL (ref 0.1–1.0)
Monocytes Relative: 7 %
Neutro Abs: 8.3 10*3/uL — ABNORMAL HIGH (ref 1.7–7.7)
Neutrophils Relative %: 81 %
Platelet Count: 250 10*3/uL (ref 150–400)
RBC: 3.28 MIL/uL — ABNORMAL LOW (ref 3.87–5.11)
RDW: 15.6 % — ABNORMAL HIGH (ref 11.5–15.5)
WBC Count: 10.2 10*3/uL (ref 4.0–10.5)
nRBC: 0 % (ref 0.0–0.2)

## 2022-02-02 DIAGNOSIS — M6281 Muscle weakness (generalized): Secondary | ICD-10-CM | POA: Diagnosis not present

## 2022-02-02 LAB — CHROMOGRANIN A: Chromogranin A (ng/mL): 571.9 ng/mL — ABNORMAL HIGH (ref 0.0–101.8)

## 2022-02-04 DIAGNOSIS — M6281 Muscle weakness (generalized): Secondary | ICD-10-CM | POA: Diagnosis not present

## 2022-02-06 NOTE — Progress Notes (Signed)
HEMATOLOGY/ONCOLOGY CLINIC NOTE  Date of Service: .01/30/2022    Patient Care Team: Carrolyn Meiers, MD as PCP - General (Internal Medicine) Satira Sark, MD as PCP - Cardiology (Cardiology) Danie Binder, MD (Inactive) (Gastroenterology) [name] Cristopher Estimable, MD as Consulting Physician (Gastroenterology)  [place] [phone] Legal Guardian [employer]  CHIEF COMPLAINTS/PURPOSE OF CONSULTATION:  Follow-up for bronchopulmonary carcinoid tumor  HISTORY OF PRESENTING ILLNESS:  [name] is a 66 y.o. F with mental disability in LTC, asthma well-controlled, CKD III Baseline Cr 1.3-1.6, HTN, and DM who presented with severe dyspnea requiring BiPAP by EMS.  On arrival in the ER was in V. tach, cardiology consulted and given amiodarone bolus, then developed unstable wide-complex tachycardia and acute respiratory failure.  Creatinine 4.3, potassium 7.5 on admission, pH 7.15, PCO2 45, lactic acid 3.2, chest x-ray pneumonia.  Temporizing potassium measures given, broad-spectrum antibiotics started, intubated and transferred to ICU at [facility]. Extubated 2/28 after 6 days.   The patient was recently admitted to the hospital earlier in February 2020 for community-acquired pneumonia.  During her hospitalization, the patient had a CT of the chest performed on 05/13/2018 which showed a masslike consolidation measuring 5 x 8.2 cm over the left lower lobe.  It was recommended that she have a follow-up CT scan performed within the next month as an outpatient.  During this current hospitalization, it was recommended that she undergo TEE and bronchoscopy.  This was performed on 05/26/2018.  Biopsy results showed typical carcinoid tumor and atypical squamous metaplasia, favor reactive.     When seen today, the patient had just finished dialysis.  She reports having fatigue and a cough.  She states that she has occasional blood-tinged sputum when she coughs.  She denies fevers and chills.   Denies chest pain and shortness of breath.  Denies abdominal discomfort, nausea, vomiting, constipation, diarrhea.  She reports intermittent headaches. Oncology was consulted for recommendations regarding her carcinoid tumor.   Of note, the patient reside in an assisted living facility in [place], [place].  She tells me that she has a son who visits her regularly but he is not her power of attorney.  She has a guardian [name]) who oversees her care.  Telephone numbers are on the chart.  INTERVAL HISTORY:   [name] is here for continued evaluation and management of her bronchopulmonary carcinoid tumor.  She is accompanied by her caregiver from her nursing home. She notes no acute new symptoms since her last clinic visit.  No new shortness of breath or wheezing or uncontrolled cough.. Labs done today show some stable anemia with a hemoglobin of 9.4 creatinine of 1.78.   Patient is a CT of the chest without contrast on 01/19/2022 which were discussed in detail with her and show stable appearance of her endobronchial filling defect within the central left lower lobe.  Stable T10 vertebral compression fracture.  MEDICAL HISTORY:  Past Medical History:  Diagnosis Date   Arthritis    Asthma    Atrial fibrillation ([place])    Carcinoid tumor determined by biopsy of lung 05/2018   Left lung   Chronic diastolic CHF (congestive heart failure) ([place])    CKD (chronic kidney disease), stage III ([place])    Diabetes mellitus type 2 in obese [facility])    Essential hypertension    Iron deficiency anemia 10/16/2010   Mental handicap 10/16/2010   Mild CAD 2013   Morbid obesity ([facility])    OSA (  obstructive sleep apnea)     SURGICAL HISTORY: Past Surgical History:  Procedure Laterality Date   ABDOMINAL HYSTERECTOMY     AV FISTULA PLACEMENT Right 06/06/2018   Procedure: ARTERIOVENOUS (AV) FISTULA CREATION;  Surgeon: Marty Heck, MD;  Location: [facility];  Service: Vascular;  Laterality:  Right;   BRONCHIAL DILITATION  12/13/2020   Procedure: BRONCHIAL DILITATION;  Surgeon: Garner Nash, DO;  Location: [facility];  Service: Pulmonary;;   CESAREAN SECTION     CHOLECYSTECTOMY     COLONOSCOPY  08/2010   normal TI, sigmoid polyp (adenoma ). Next TCS due  08/2015,   COLONOSCOPY WITH PROPOFOL N/A 07/08/2020   Procedure: COLONOSCOPY WITH PROPOFOL;  Surgeon: Daneil Dolin, MD;  [facility];  Service: Endoscopy;  Laterality: N/A;  AM (diabetic and facility patient)   ESOPHAGOGASTRODUODENOSCOPY  08/2010   antral and duodenal erosions s/p bx (chronic gastritis, no h.pylori, no celiac dz ), hiatal hernia   FISTULA SUPERFICIALIZATION Right 08/25/2018   Procedure: FISTULA SUPERFICIALIZATION RIGHT ARM;  Surgeon: Waynetta Sandy, MD;  Location: [facility];  Service: Cardiovascular;  Laterality: Right;   IR FLUORO GUIDE CV LINE RIGHT  05/31/2018   IR US GUIDE VASC ACCESS RIGHT  05/31/2018   KNEE SURGERY     right knee @ 66 years of age   LEFT AND RIGHT HEART CATHETERIZATION WITH CORONARY ANGIOGRAM N/A 09/21/2011   Procedure: LEFT AND RIGHT HEART CATHETERIZATION WITH CORONARY ANGIOGRAM;  Surgeon: Birdie Riddle, MD;  Location: [facility];  Service: Cardiovascular;  Laterality: N/A;   POLYPECTOMY  07/08/2020   Procedure: POLYPECTOMY INTESTINAL;  Surgeon: Daneil Dolin, MD;  [facility];  Service: Endoscopy;;   VIDEO BRONCHOSCOPY Left 12/13/2020   Procedure: VIDEO BRONCHOSCOPY WITHOUT FLUORO;  Surgeon: Garner Nash, DO;  Location: [facility];  Service: Pulmonary;  Laterality: Left;  Cryotherapy    SOCIAL HISTORY: Social History   Socioeconomic History   Marital status: Divorced    Spouse name: Not on file   Number of children: 1   Years of education: Not on file   Highest education level: Not on file  Occupational History    Employer: UNEMPLOYED  Tobacco Use   Smoking status: Former    Packs/day: 1.00    Years: 0.00    Pack years: 0.00    Types:  Cigarettes    Quit date: 04/11/1996    Years since quitting: 25.3   Smokeless tobacco: Never   Tobacco comments:    quit a couple year ago  Vaping Use   Vaping Use: Never used  Substance and Sexual Activity   Alcohol use: No   Drug use: No   Sexual activity: Never  Other Topics Concern   Not on file  Social History Narrative   Lives with parents.    Social Determinants of Health   Financial Resource Strain: Not on file  Food Insecurity: Not on file  Transportation Needs: Not on file  Physical Activity: Not on file  Stress: Not on file  Social Connections: Not on file  Intimate Partner Violence: Not on file    FAMILY HISTORY: Family History  Problem Relation Age of Onset   Diabetes Mother    Hypertension Mother    Heart failure Mother    Hypertension Father    Atrial fibrillation Father    Kidney disease Other        son reportedly has had cyst on kidney and lung s/p surgery at  [facility]   Hypertension Son    Colon cancer Neg Hx     ALLERGIES:  has No Known Allergies.  MEDICATIONS:  Current Outpatient Medications  Medication Sig Dispense Refill   acetaminophen (TYLENOL) 325 MG tablet Take 325 mg by mouth every 12 (twelve) hours as needed for moderate pain.     albuterol (PROVENTIL HFA;VENTOLIN HFA) 108 (90 Base) MCG/ACT inhaler Inhale 2 puffs into the lungs every 6 (six) hours as needed for shortness of breath.     apixaban (ELIQUIS) 5 MG TABS tablet Take 1 tablet (5 mg total) by mouth 2 (two) times daily. 60 tablet 0   benzonatate (TESSALON) 100 MG capsule Take 1 capsule (100 mg total) by mouth 2 (two) times daily as needed for cough. 60 capsule 0   betamethasone dipropionate (DIPROLENE) 0.05 % ointment Apply 1 application topically daily as needed (for rash under breasts).      calcitRIOL (ROCALTROL) 0.25 MCG capsule Take 0.25 mcg by mouth every Monday, Wednesday, and Friday.     cetirizine (ZYRTEC) 10 MG tablet Take 10 mg by mouth daily. (0800)     Cholecalciferol  (VITAMIN D3) 50 MCG (2000 UT) TABS TAKE 1 TABLET BY MOUTH ONCE DAILY. 30 tablet 2   clotrimazole (LOTRIMIN) 1 % cream Apply 1 application topically daily as needed (under breasts as needed for rash).     DIABETIC TUSSIN EX 100 MG/5ML syrup Take 100 mg by mouth every 4 (four) hours as needed for cough or congestion.      diltiazem (CARDIZEM CD) 240 MG 24 hr capsule Take 1 capsule (240 mg total) by mouth daily. (Patient taking differently: Take 240 mg by mouth daily. (0800)) 30 capsule 0   DROPSAFE SAFETY PEN NEEDLES 31G X 6 MM MISC      EASYMAX TEST test strip CHECK BLOOD SUGAR 4 TIMES A DAY. (BREAKFAST, LUNCH, SUPPER & BEDTIME) 100 strip 5   fluticasone (FLONASE) 50 MCG/ACT nasal spray Place 2 sprays into both nostrils daily as needed for allergies or rhinitis.     Fluticasone-Umeclidin-Vilant 100-62.5-25 MCG/INH AEPB Inhale 1 puff into the lungs daily. (0800)     furosemide (LASIX) 40 MG tablet Take 40 mg by mouth See admin instructions. Take 40 mg twice daily, alternating with 40 mg daily every other day     guaiFENesin (MUCINEX) 600 MG 12 hr tablet Take 1 tablet (600 mg total) by mouth 2 (two) times daily as needed. (Patient taking differently: Take 600 mg by mouth 2 (two) times daily as needed (cough/congestion).) 60 tablet 11   HUMULIN R U-500 KWIKPEN 500 UNIT/ML KwikPen INJECT 40 UNITS SUBCUTANEOUSLY AT BREAKFAST; INJECT 30 UNITS AT LUNCH & SUPPER; WHEN GLUCOSE IS ABOVE 90 & EATING.(HOLD IF BS BELOW 70: CALL MD IF BS ABOVE 400 6 mL 0   ipratropium-albuterol (DUONEB) 0.5-2.5 (3) MG/3ML SOLN Take 3 mLs by nebulization every 6 (six) hours as needed (for cough).      isosorbide dinitrate (ISORDIL) 10 MG tablet Take 1 tablet (10 mg total) by mouth 3 (three) times daily. (Patient taking differently: Take 10 mg by mouth 3 (three) times daily. (0800, 1400 & 2000)) 90 tablet 0   Lancets 28G MISC CHECK BLOOD SUGAR 4 TIMES A DAY. (BREAKFAST, LUNCH, SUPPER, & BEDTIME) 100 each 5   losartan (COZAAR) 25 MG  tablet Take 25 mg by mouth daily.     metolazone (ZAROXOLYN) 5 MG tablet Take 5 mg by mouth in the morning. (0800)     metoprolol tartrate (  LOPRESSOR) 50 MG tablet Take 50 mg by mouth 2 (two) times daily. (0800 & 2000)     montelukast (SINGULAIR) 10 MG tablet Take 10 mg by mouth at bedtime. (2000)     polyethylene glycol powder (GLYCOLAX/MIRALAX) 17 GM/SCOOP powder Take 17 g by mouth daily as needed for mild constipation or moderate constipation.     potassium chloride SA (KLOR-CON) 20 MEQ tablet Take 20 mEq by mouth 3 (three) times daily. (0800, 1400 & 2000)     rosuvastatin (CRESTOR) 20 MG tablet TAKE (1) TABLET BY MOUTH ONCE DAILY. (Patient taking differently: Take 20 mg by mouth daily at 8 pm. (2000)) 30 tablet 3   triamcinolone cream (KENALOG) 0.5 % Apply 1 application topically every 12 (twelve) hours as needed (applied to bilateral legs for allergic dermatitis).     Vitamin D, Cholecalciferol, 25 MCG (1000 UT) TABS TAKE 1 TABLET BY MOUTH ONCE DAILY. 30 tablet 1   No current facility-administered medications for this visit.    REVIEW OF SYSTEMS:   10 Point review of Systems was done is negative except as noted above.  PHYSICAL EXAMINATION: ECOG PERFORMANCE STATUS: 3 - Symptomatic, >50% confined to bed  . Vitals:   07/28/21 0950  BP: 126/75  Pulse: 72  Resp: 18  Temp: (!) 97.3 F (36.3 C)  SpO2: 99%   Filed Weights   07/28/21 0950  Weight: 259 lb 6.4 oz (117.7 kg)   .Body mass index is 43.17 kg/m.   NAD GENERAL:alert, in no acute distress and comfortable SKIN: no acute rashes, no significant lesions EYES: conjunctiva are pink and non-injected, sclera anicteric OROPHARYNX: MMM, no exudates, no oropharyngeal erythema or ulceration NECK: supple, no JVD LYMPH:  no palpable lymphadenopathy in the cervical, axillary or inguinal regions LUNGS: clear to auscultation b/l with normal respiratory effort HEART: regular rate & rhythm ABDOMEN:  normoactive bowel sounds , non  tender, not distended. Extremity: no pedal edema PSYCH: alert & oriented x 3 with fluent speech NEURO: no focal motor/sensory deficits  LABORATORY DATA:  I have reviewed the data as listed  .    Latest Ref Rng & Units 07/28/2021    9:20 AM 01/28/2021    1:57 PM 12/13/2020    7:48 AM  CBC  WBC 4.0 - 10.5 K/uL 12.3   10.5   8.0    Hemoglobin 12.0 - 15.0 g/dL 11.1   10.1   10.6    Hematocrit 36.0 - 46.0 % 33.8   31.9   34.5    Platelets 150 - 400 K/uL 352   282   238      .    Latest Ref Rng & Units 07/28/2021    9:20 AM 01/28/2021    1:57 PM 11/19/2020    8:35 AM  CMP  Glucose 70 - 99 mg/dL 206   204   176    BUN 8 - 23 mg/dL 50   45   46    Creatinine 0.44 - 1.00 mg/dL 1.56   1.92   1.60    Sodium 135 - 145 mmol/L 140   137   142    Potassium 3.5 - 5.1 mmol/L 4.3   3.2   3.5    Chloride 98 - 111 mmol/L 98   96   97    CO2 22 - 32 mmol/L 34   31   25    Calcium 8.9 - 10.3 mg/dL 9.1   9.4   8.7  Total Protein 6.5 - 8.1 g/dL 7.1   7.5   6.6    Total Bilirubin 0.3 - 1.2 mg/dL 0.4   0.5   0.2    Alkaline Phos 38 - 126 U/L 79   81   93    AST 15 - 41 U/L 13   11   13     ALT 0 - 44 U/L 27   9   10      Chromogranin level pending  05/26/18 Biopsy:    RADIOGRAPHIC STUDIES: I have personally reviewed the radiological images as listed and agreed with the findings in the report. No results found.   ASSESSMENT & PLAN:  66 y.o. female with  1.  BronchoPulmonary carcinoid tumor - lung primary.  Causing some left lower lobe airway obstruction with post obstructive atelectasis of anterior left lower lobe.   2.  Anemia: Likely due to her renal disease. No transfusion is indicated.   She is currently on Aranesp.   3.  Kidney disease:  Management per nephrology.  PLAN:  Patient lab from today were discussed in detail with her.  CT chest was discussed in detail and shows stable left lower lobe pulmonary lesion from her bronchopulmonary carcinoid.  Patient has no clinical, lab  or radiographic evidence of disease progression at this time. We shall continue active surveillance at this time.  No indication at this time to pursue significant surgery or radiation.   FOLLOW UP: CT chest without contrast in 22 weeks Return to clinic with Dr. Irene Limbo with labs in 24 weeks  The total time spent in the appointment was 20 minutes*.  All of the patient's questions were answered with apparent satisfaction. The patient knows to call the clinic with any problems, questions or concerns.   Sullivan Lone MD MS AAHIVMS [facility] Hematology/Oncology Physician [facility]  .*Total Encounter Time as defined by the Centers for Medicare and Medicaid Services includes, in addition to the face-to-face time of a patient visit (documented in the note above) non-face-to-face time: obtaining and reviewing outside history, ordering and reviewing medications, tests or procedures, care coordination (communications with other health care professionals or caregivers) and documentation in the medical record.

## 2022-02-09 DIAGNOSIS — M6281 Muscle weakness (generalized): Secondary | ICD-10-CM | POA: Diagnosis not present

## 2022-02-11 DIAGNOSIS — M6281 Muscle weakness (generalized): Secondary | ICD-10-CM | POA: Diagnosis not present

## 2022-02-16 DIAGNOSIS — M6281 Muscle weakness (generalized): Secondary | ICD-10-CM | POA: Diagnosis not present

## 2022-02-17 ENCOUNTER — Ambulatory Visit (HOSPITAL_COMMUNITY)
Admission: RE | Admit: 2022-02-17 | Discharge: 2022-02-17 | Disposition: A | Discharge status: Home / Self Care | Payer: Medicare Other | Source: Ambulatory Visit | Attending: Internal Medicine | Admitting: Internal Medicine

## 2022-02-17 ENCOUNTER — Other Ambulatory Visit (HOSPITAL_COMMUNITY): Payer: Self-pay | Admitting: Internal Medicine

## 2022-02-17 DIAGNOSIS — I110 Hypertensive heart disease with heart failure: Secondary | ICD-10-CM | POA: Diagnosis not present

## 2022-02-17 DIAGNOSIS — J441 Chronic obstructive pulmonary disease with (acute) exacerbation: Secondary | ICD-10-CM | POA: Insufficient documentation

## 2022-02-17 DIAGNOSIS — N184 Chronic kidney disease, stage 4 (severe): Secondary | ICD-10-CM | POA: Diagnosis not present

## 2022-02-17 DIAGNOSIS — Z1331 Encounter for screening for depression: Secondary | ICD-10-CM | POA: Diagnosis not present

## 2022-02-17 DIAGNOSIS — M6281 Muscle weakness (generalized): Secondary | ICD-10-CM | POA: Diagnosis not present

## 2022-02-17 DIAGNOSIS — J449 Chronic obstructive pulmonary disease, unspecified: Secondary | ICD-10-CM | POA: Diagnosis not present

## 2022-02-17 DIAGNOSIS — E1165 Type 2 diabetes mellitus with hyperglycemia: Secondary | ICD-10-CM | POA: Diagnosis not present

## 2022-02-17 DIAGNOSIS — Z1389 Encounter for screening for other disorder: Secondary | ICD-10-CM | POA: Diagnosis not present

## 2022-02-17 DIAGNOSIS — I480 Paroxysmal atrial fibrillation: Secondary | ICD-10-CM | POA: Diagnosis not present

## 2022-02-17 DIAGNOSIS — I5032 Chronic diastolic (congestive) heart failure: Secondary | ICD-10-CM | POA: Diagnosis not present

## 2022-02-17 DIAGNOSIS — R059 Cough, unspecified: Secondary | ICD-10-CM | POA: Diagnosis not present

## 2022-02-23 DIAGNOSIS — M6281 Muscle weakness (generalized): Secondary | ICD-10-CM | POA: Diagnosis not present

## 2022-02-23 NOTE — Progress Notes (Unsigned)
Cardiology Office Note  Date: 02/24/2022   ID: [name], DOB Feb 10, 1956, MRN [id number]  PCP:  Carrolyn Meiers, MD  Cardiologist:  Rozann Lesches, MD Electrophysiologist:  None   Chief Complaint  Patient presents with   Cardiac follow-up    History of Present Illness: [name] is a 66 y.o. female last seen in May 2023.  She is here today for a follow-up visit, still resides at [facility].  This occasional intermittent sense of palpitations, nothing progressive or associated with shortness of breath.  She has had no syncope.  Continues to follow with Dr. Legrand Rams for primary care.  I reviewed her recent lab work.  She is anemic but does not report any spontaneous bleeding problems on Eliquis.  No obvious changes in her stool.  I personally reviewed recent ECG obtained showing rate controlled atrial fibrillation with nonspecific T wave changes.  I reviewed her medications which are otherwise stable and outlined below.  She continues on diuretic regimen and has follow-up with nephrology.  Most recent creatinine was 1.78.  Weights have been stable.  Past Medical History:  Diagnosis Date   Arthritis    Asthma    Atrial fibrillation ([place])    Carcinoid tumor determined by biopsy of lung 05/2018   Left lung   Chronic diastolic CHF (congestive heart failure) ([place])    CKD (chronic kidney disease), stage III ([place])    Diabetes mellitus type 2 in obese [facility])    Essential hypertension    Iron deficiency anemia 10/16/2010   Mental handicap 10/16/2010   Mild CAD 2013   Morbid obesity ([facility])    OSA (obstructive sleep apnea)     Past Surgical History:  Procedure Laterality Date   ABDOMINAL HYSTERECTOMY     AV FISTULA PLACEMENT Right 06/06/2018   Procedure: ARTERIOVENOUS (AV) FISTULA CREATION;  Surgeon: Marty Heck, MD;  Location: [place];  Service: Vascular;  Laterality: Right;   BIOPSY  10/16/2021   Procedure: BIOPSY;  Surgeon: Daneil Dolin, MD;  [facility];  Service: Endoscopy;;   BRONCHIAL DILITATION  12/13/2020   Procedure: BRONCHIAL DILITATION;  Surgeon: Garner Nash, DO;  Location: [facility];  Service: Pulmonary;;   CESAREAN SECTION     CHOLECYSTECTOMY     COLONOSCOPY  08/2010   normal TI, sigmoid polyp (adenoma ). Next TCS due  08/2015,   COLONOSCOPY WITH PROPOFOL N/A 07/08/2020   Procedure: COLONOSCOPY WITH PROPOFOL;  Surgeon: Daneil Dolin, MD;  [facility];  Service: Endoscopy;  Laterality: N/A;  AM (diabetic and facility patient)   ESOPHAGOGASTRODUODENOSCOPY  08/2010   antral and duodenal erosions s/p bx (chronic gastritis, no h.pylori, no celiac dz ), hiatal hernia   ESOPHAGOGASTRODUODENOSCOPY (EGD) WITH PROPOFOL N/A 10/16/2021   Procedure: ESOPHAGOGASTRODUODENOSCOPY (EGD) WITH PROPOFOL;  Surgeon: Daneil Dolin, MD;  [facility];  Service: Endoscopy;  Laterality: N/A;  11:45am   ESOPHAGOGASTRODUODENOSCOPY (EGD) WITH PROPOFOL N/A 12/18/2021   Procedure: ESOPHAGOGASTRODUODENOSCOPY (EGD) WITH PROPOFOL;  Surgeon: [name], DO;  [facility];  Service: Endoscopy;  Laterality: N/A;   FISTULA SUPERFICIALIZATION Right 08/25/2018   Procedure: FISTULA SUPERFICIALIZATION RIGHT ARM;  Surgeon: Waynetta Sandy, MD;  Location: [place];  Service: Cardiovascular;  Laterality: Right;   HOT HEMOSTASIS  12/18/2021   Procedure: HOT HEMOSTASIS (ARGON PLASMA COAGULATION/BICAP);  Surgeon: [name], DO;  [facility];  Service: Endoscopy;;   IR FLUORO GUIDE CV LINE RIGHT  05/31/2018   IR US GUIDE VASC ACCESS RIGHT  05/31/2018   KNEE SURGERY     right knee @ 66 years of age   LEFT AND RIGHT HEART CATHETERIZATION WITH CORONARY ANGIOGRAM N/A 09/21/2011   Procedure: LEFT AND RIGHT HEART CATHETERIZATION WITH CORONARY ANGIOGRAM;  Surgeon: Birdie Riddle, MD;  Location: [place] [facility];  Service: Cardiovascular;  Laterality: N/A;   POLYPECTOMY  07/08/2020   Procedure: POLYPECTOMY INTESTINAL;   Surgeon: Daneil Dolin, MD;  [facility];  Service: Endoscopy;;   VIDEO BRONCHOSCOPY Left 12/13/2020   Procedure: VIDEO BRONCHOSCOPY WITHOUT FLUORO;  Surgeon: Garner Nash, DO;  Location: [place];  Service: Pulmonary;  Laterality: Left;  Cryotherapy    Current Outpatient Medications  Medication Sig Dispense Refill   acetaminophen (TYLENOL) 325 MG tablet Take 325 mg by mouth every 12 (twelve) hours as needed for moderate pain.     albuterol (PROVENTIL HFA;VENTOLIN HFA) 108 (90 Base) MCG/ACT inhaler Inhale 2 puffs into the lungs every 4 (four) hours as needed for shortness of breath.     apixaban (ELIQUIS) 5 MG TABS tablet Take 1 tablet (5 mg total) by mouth 2 (two) times daily. 60 tablet 3   azithromycin (ZITHROMAX) 250 MG tablet Take 250 mg by mouth as directed.     betamethasone dipropionate (DIPROLENE) 0.05 % ointment Apply 1 application  topically 2 (two) times daily as needed (for rash under breasts).     calcitRIOL (ROCALTROL) 0.25 MCG capsule Take 0.25 mcg by mouth every Monday, Wednesday, and Friday.     cetirizine (ZYRTEC) 10 MG tablet Take 10 mg by mouth daily.     Cholecalciferol (VITAMIN D3) 50 MCG (2000 UT) TABS TAKE 1 TABLET BY MOUTH ONCE DAILY. 30 tablet 2   clotrimazole (LOTRIMIN) 1 % cream Apply 1 application  topically 2 (two) times daily as needed (for rash under breasts).     diltiazem (CARDIZEM CD) 240 MG 24 hr capsule Take 1 capsule (240 mg total) by mouth daily. 30 capsule 0   DROPSAFE SAFETY PEN NEEDLES 31G X 6 MM MISC      EASYMAX TEST test strip CHECK BLOOD SUGAR 4 TIMES A DAY. (BREAKFAST, LUNCH, SUPPER & BEDTIME) 200 strip 2   ferrous sulfate 325 (65 FE) MG tablet Take 1 tablet (325 mg total) by mouth daily with breakfast. 90 tablet 3   Finerenone (KERENDIA) 10 MG TABS Take 10 mg by mouth daily.     fluticasone (FLONASE) 50 MCG/ACT nasal spray Place 2 sprays into both nostrils daily as needed for allergies.     Fluticasone-Umeclidin-Vilant  (TRELEGY ELLIPTA) 100-62.5-25 MCG/ACT AEPB Inhale 1 puff into the lungs daily.     furosemide (LASIX) 40 MG tablet Take 20 mg by mouth See admin instructions. Take 20 mg twice daily, alternating with 20 mg daily every other day     GERI-TUSSIN DM 10-100 MG/5ML liquid Take by mouth.     HUMULIN R U-500 KWIKPEN 500 UNIT/ML KwikPen INJECT 40 UNITS SUBCUTANEOUSLY AT BREAKFAST; INJECT 30 UNITS AT LUNCH & SUPPER; WHEN GLUCOSE IS ABOVE 90 & EATING.(HOLD IF BS BELOW 70: CALL MD IF BS ABOVE 400 (Patient taking differently: 30-40 Units See admin instructions. Inject 40 units at breakfast, Inject 30 units at lunch and supper; when glucose is above 90 and eating (hold if BS below 70: Call MD if BS above 400)) 6 mL 2   hydrOXYzine (ATARAX) 25 MG tablet Take 25 mg by mouth 3 (three) times daily.  ipratropium-albuterol (DUONEB) 0.5-2.5 (3) MG/3ML SOLN Take 3 mLs by nebulization every 6 (six) hours as needed (for cough).     isosorbide dinitrate (ISORDIL) 10 MG tablet Take 1 tablet (10 mg total) by mouth 3 (three) times daily. 90 tablet 0   Lancets 28G MISC Use to test BG qid. E11.65 100 each 5   loperamide (IMODIUM) 2 MG capsule Take 2-4 mg by mouth See admin instructions. Take 4 mg by mouth now: then 2 mg after each loose stool as needed, do not exceed 5 doses in 24 hours.     losartan (COZAAR) 25 MG tablet Take 12.5 mg by mouth daily.     metolazone (ZAROXOLYN) 5 MG tablet Take 5 mg by mouth in the morning.     metoprolol tartrate (LOPRESSOR) 50 MG tablet Take 50 mg by mouth 2 (two) times daily.     montelukast (SINGULAIR) 10 MG tablet Take 10 mg by mouth at bedtime.     pantoprazole (PROTONIX) 40 MG tablet Take one po 30 minutes prior to breakfast daily. 90 tablet 3   polyethylene glycol powder (GLYCOLAX/MIRALAX) 17 GM/SCOOP powder Take 17 g by mouth daily as needed for mild constipation or moderate constipation.     potassium chloride SA (KLOR-CON M) 20 MEQ tablet Take 20 mEq by mouth daily.     rosuvastatin  (CRESTOR) 20 MG tablet TAKE (1) TABLET BY MOUTH ONCE DAILY. (Patient taking differently: Take 20 mg by mouth daily.) 30 tablet 3   triamcinolone cream (KENALOG) 0.5 % Apply 1 application topically every 12 (twelve) hours as needed (applied to bilateral legs for allergic dermatitis).     No current facility-administered medications for this visit.   Allergies:  Patient has no known allergies.   ROS: No orthopnea or PND.  Physical Exam: VS:  BP 132/66   Pulse 74   Ht 5\' 2"  (1.575 m)   Wt 250 lb 12.8 oz (113.8 kg)   SpO2 100%   BMI 45.87 kg/m , BMI Body mass index is 45.87 kg/m.  Wt Readings from Last 3 Encounters:  02/24/22 250 lb 12.8 oz (113.8 kg)  01/30/22 252 lb 11.2 oz (114.6 kg)  01/15/22 252 lb 6.4 oz (114.5 kg)    General: Patient appears comfortable at rest. HEENT: Conjunctiva and lids normal. Neck: Supple, no elevated JVP or carotid bruits. Lungs: Clear to auscultation, nonlabored breathing at rest. Cardiac: Irregularly irregular, no S3, 1/6 systolic murmur. Extremities: No pitting edema.  ECG:  An ECG dated 12/13/2020 was personally reviewed today and demonstrated:  Atrial fibrillation with prolonged QT interval.  Recent Labwork: 12/17/2021: B Natriuretic Peptide 461.0 01/30/2022: ALT 7; AST 12; BUN 42; Creatinine 1.78; Hemoglobin 9.4; Platelet Count 250; Potassium 3.4; Sodium 136     Component Value Date/Time   CHOL 127 05/07/2020 0000   CHOL 128 04/23/2020 0820   TRIG 150 05/07/2020 0000   HDL 42 05/07/2020 0000   HDL 43 04/23/2020 0820   CHOLHDL 3.0 04/23/2020 0820   CHOLHDL 5.5 (H) 09/12/2019 0831   VLDL 59 (H) 06/02/2018 0029   LDLCALC 62 05/07/2020 0000   LDLCALC 60 04/23/2020 0820   LDLCALC 124 (H) 09/12/2019 0831    Other Studies Reviewed Today:  TEE 05/26/2018:  1. Calcified, heterogeneous, primarily immobile lesion on the left  coronary cusp, appears to be primarily on the ventricular aspect of the  left coronary cusp. In clip 33 where it is well  seen it measures 0.98 cm x  0.56 cm. The lesion  has the appearance  of a subacute or healed vegetation. Differential also includes calcified  nodule in the setting of chronic dialysis.   2. The left ventricle has normal systolic function, with an ejection  fraction of 60-65%. The cavity size was normal.   3. The right ventricle has normal systolc function. The cavity was  normal. There is no increase in right ventricular wall thickness.   4. The aortic valve is tricuspid. Aortic valve regurgitation is trivial  by color flow Doppler.   5. The tricuspid valve was normal in structure.   6. The pulmonic valve was normal in structure.   7. There is evidence of severe atherosclerotic plaque in the aortic arch  and descending aorta.   8. Tiny patent foramen ovale with left to right shunting across the  atrial septum by color flow Doppler.   9. The interatrial septum appears to be lipomatous.  10. No left atrial appendage thrombus.   Assessment and Plan:  1.  Paroxysmal to persistent atrial fibrillation with CHA2DS2-VASc score of 4.  She is in atrial fibrillation today although with controlled heart rate and asymptomatic.  Continue Eliquis for stroke prophylaxis.  She is on Cardizem CD and Lopressor, no changes in current dosing.  I reviewed her recent ECG.  2.  HFpEF, LVEF 60 to 65% by last assessment.  Weight has been stable and she continues on Lasix with potassium supplement and Zaroxolyn.  She is also on losartan and [place].  3.  CKD stage IIIb, continue to follow with nephrology.  Creatinine 1.78.  Medication Adjustments/Labs and Tests Ordered: Current medicines are reviewed at length with the patient today.  Concerns regarding medicines are outlined above.   Tests Ordered: Orders Placed This Encounter  Procedures   EKG 12-Lead    Medication Changes: No orders of the defined types were placed in this encounter.   Disposition:  Follow up  6 months.  Signed, Satira Sark, MD, [facility] 02/24/2022 9:32 AM    [facility]. [address], [place], [place] [address] Phone: [phone]; Fax: [phone]

## 2022-02-24 ENCOUNTER — Ambulatory Visit: Payer: Medicare Other | Attending: Cardiology | Admitting: Cardiology

## 2022-02-24 ENCOUNTER — Encounter: Payer: Self-pay | Admitting: Cardiology

## 2022-02-24 VITALS — BP 132/66 | HR 74 | Ht 62.0 in | Wt 250.8 lb

## 2022-02-24 DIAGNOSIS — I480 Paroxysmal atrial fibrillation: Secondary | ICD-10-CM | POA: Diagnosis not present

## 2022-02-24 DIAGNOSIS — N1832 Chronic kidney disease, stage 3b: Secondary | ICD-10-CM | POA: Diagnosis not present

## 2022-02-24 DIAGNOSIS — I5032 Chronic diastolic (congestive) heart failure: Secondary | ICD-10-CM | POA: Insufficient documentation

## 2022-02-24 NOTE — Patient Instructions (Addendum)
Medication Instructions:  Your physician recommends that you continue on your current medications as directed. Please refer to the Current Medication list given to you today.   Labwork: None today  Testing/Procedures: None Today    Follow-Up: 6 months  Any Other Special Instructions Will Be Listed Below (If Applicable).  If you need a refill on your cardiac medications before your next appointment, please call your pharmacy.

## 2022-02-25 DIAGNOSIS — M6281 Muscle weakness (generalized): Secondary | ICD-10-CM | POA: Diagnosis not present

## 2022-03-02 DIAGNOSIS — Z794 Long term (current) use of insulin: Secondary | ICD-10-CM | POA: Diagnosis not present

## 2022-03-02 DIAGNOSIS — N184 Chronic kidney disease, stage 4 (severe): Secondary | ICD-10-CM | POA: Diagnosis not present

## 2022-03-02 DIAGNOSIS — M6281 Muscle weakness (generalized): Secondary | ICD-10-CM | POA: Diagnosis not present

## 2022-03-02 DIAGNOSIS — E1122 Type 2 diabetes mellitus with diabetic chronic kidney disease: Secondary | ICD-10-CM | POA: Diagnosis not present

## 2022-03-03 LAB — COMPREHENSIVE METABOLIC PANEL
ALT: 8 IU/L (ref 0–32)
AST: 15 IU/L (ref 0–40)
Albumin/Globulin Ratio: 1.3 (ref 1.2–2.2)
Albumin: 3.9 g/dL (ref 3.9–4.9)
Alkaline Phosphatase: 82 IU/L (ref 44–121)
BUN/Creatinine Ratio: 21 (ref 12–28)
BUN: 42 mg/dL — ABNORMAL HIGH (ref 8–27)
Bilirubin Total: 0.4 mg/dL (ref 0.0–1.2)
CO2: 21 mmol/L (ref 20–29)
Calcium: 9.5 mg/dL (ref 8.7–10.3)
Chloride: 95 mmol/L — ABNORMAL LOW (ref 96–106)
Creatinine, Ser: 1.98 mg/dL — ABNORMAL HIGH (ref 0.57–1.00)
Globulin, Total: 2.9 g/dL (ref 1.5–4.5)
Glucose: 174 mg/dL — ABNORMAL HIGH (ref 70–99)
Potassium: 4.1 mmol/L (ref 3.5–5.2)
Sodium: 137 mmol/L (ref 134–144)
Total Protein: 6.8 g/dL (ref 6.0–8.5)
eGFR: 27 mL/min/{1.73_m2} — ABNORMAL LOW (ref >59)

## 2022-03-03 LAB — LIPID PANEL
Chol/HDL Ratio: 2.9 ratio (ref 0.0–4.4)
Cholesterol, Total: 112 mg/dL (ref 100–199)
HDL: 39 mg/dL — ABNORMAL LOW (ref >39)
LDL Chol Calc (NIH): 42 mg/dL (ref 0–99)
Triglycerides: 190 mg/dL — ABNORMAL HIGH (ref 0–149)
VLDL Cholesterol Cal: 31 mg/dL (ref 5–40)

## 2022-03-03 LAB — T4, FREE: Free T4: 1.51 ng/dL (ref 0.82–1.77)

## 2022-03-03 LAB — TSH: TSH: 2.71 u[IU]/mL (ref 0.450–4.500)

## 2022-03-04 DIAGNOSIS — M6281 Muscle weakness (generalized): Secondary | ICD-10-CM | POA: Diagnosis not present

## 2022-03-09 ENCOUNTER — Encounter: Payer: Self-pay | Admitting: "Endocrinology

## 2022-03-09 ENCOUNTER — Ambulatory Visit (INDEPENDENT_AMBULATORY_CARE_PROVIDER_SITE_OTHER): Payer: Medicare Other | Admitting: "Endocrinology

## 2022-03-09 VITALS — BP 104/66 | HR 64 | Ht 62.0 in | Wt 251.4 lb

## 2022-03-09 DIAGNOSIS — I10 Essential (primary) hypertension: Secondary | ICD-10-CM | POA: Diagnosis not present

## 2022-03-09 DIAGNOSIS — Z794 Long term (current) use of insulin: Secondary | ICD-10-CM | POA: Diagnosis not present

## 2022-03-09 DIAGNOSIS — E1122 Type 2 diabetes mellitus with diabetic chronic kidney disease: Secondary | ICD-10-CM | POA: Diagnosis not present

## 2022-03-09 DIAGNOSIS — M6281 Muscle weakness (generalized): Secondary | ICD-10-CM | POA: Diagnosis not present

## 2022-03-09 DIAGNOSIS — N184 Chronic kidney disease, stage 4 (severe): Secondary | ICD-10-CM | POA: Diagnosis not present

## 2022-03-09 DIAGNOSIS — E782 Mixed hyperlipidemia: Secondary | ICD-10-CM | POA: Diagnosis not present

## 2022-03-09 NOTE — Progress Notes (Signed)
03/09/2022  Endocrinology follow-up note   Subjective:    Patient ID: [name], female    DOB: December 21, 1955, PCP Carrolyn Meiers, MD   Past Medical History:  Diagnosis Date   Arthritis    Asthma    Atrial fibrillation ([place])    Carcinoid tumor determined by biopsy of lung 05/2018   Left lung   Chronic diastolic CHF (congestive heart failure) ([place])    CKD (chronic kidney disease), stage III ([place])    Diabetes mellitus type 2 in obese [facility])    Essential hypertension    Iron deficiency anemia 10/16/2010   Mental handicap 10/16/2010   Mild CAD 2013   Morbid obesity ([place])    OSA (obstructive sleep apnea)    Past Surgical History:  Procedure Laterality Date   ABDOMINAL HYSTERECTOMY     AV FISTULA PLACEMENT Right 06/06/2018   Procedure: ARTERIOVENOUS (AV) FISTULA CREATION;  Surgeon: Marty Heck, MD;  Location: [place];  Service: Vascular;  Laterality: Right;   BIOPSY  10/16/2021   Procedure: BIOPSY;  Surgeon: Daneil Dolin, MD;  [facility];  Service: Endoscopy;;   BRONCHIAL DILITATION  12/13/2020   Procedure: BRONCHIAL DILITATION;  Surgeon: Garner Nash, DO;  Location: [facility];  Service: Pulmonary;;   CESAREAN SECTION     CHOLECYSTECTOMY     COLONOSCOPY  08/2010   normal TI, sigmoid polyp (adenoma ). Next TCS due  08/2015,   COLONOSCOPY WITH PROPOFOL N/A 07/08/2020   Procedure: COLONOSCOPY WITH PROPOFOL;  Surgeon: Daneil Dolin, MD;  [facility];  Service: Endoscopy;  Laterality: N/A;  AM (diabetic and facility patient)   ESOPHAGOGASTRODUODENOSCOPY  08/2010   antral and duodenal erosions s/p bx (chronic gastritis, no h.pylori, no celiac dz ), hiatal hernia   ESOPHAGOGASTRODUODENOSCOPY (EGD) WITH PROPOFOL N/A 10/16/2021   Procedure: ESOPHAGOGASTRODUODENOSCOPY (EGD) WITH PROPOFOL;  Surgeon: Daneil Dolin, MD;  [facility];  Service: Endoscopy;  Laterality: N/A;  11:45am   ESOPHAGOGASTRODUODENOSCOPY (EGD) WITH PROPOFOL  N/A 12/18/2021   Procedure: ESOPHAGOGASTRODUODENOSCOPY (EGD) WITH PROPOFOL;  Surgeon: [name], DO;  [facility];  Service: Endoscopy;  Laterality: N/A;   FISTULA SUPERFICIALIZATION Right 08/25/2018   Procedure: FISTULA SUPERFICIALIZATION RIGHT ARM;  Surgeon: Waynetta Sandy, MD;  Location: [place];  Service: Cardiovascular;  Laterality: Right;   HOT HEMOSTASIS  12/18/2021   Procedure: HOT HEMOSTASIS (ARGON PLASMA COAGULATION/BICAP);  Surgeon: [name], DO;  [facility];  Service: Endoscopy;;   IR FLUORO GUIDE CV LINE RIGHT  05/31/2018   IR US GUIDE VASC ACCESS RIGHT  05/31/2018   KNEE SURGERY     right knee @ 66 years of age   LEFT AND RIGHT HEART CATHETERIZATION WITH CORONARY ANGIOGRAM N/A 09/21/2011   Procedure: LEFT AND RIGHT HEART CATHETERIZATION WITH CORONARY ANGIOGRAM;  Surgeon: Birdie Riddle, MD;  Location: [place] [facility];  Service: Cardiovascular;  Laterality: N/A;   POLYPECTOMY  07/08/2020   Procedure: POLYPECTOMY INTESTINAL;  Surgeon: Daneil Dolin, MD;  [facility];  Service: Endoscopy;;   VIDEO BRONCHOSCOPY Left 12/13/2020   Procedure: VIDEO BRONCHOSCOPY WITHOUT FLUORO;  Surgeon: Garner Nash, DO;  Location: [place];  Service: Pulmonary;  Laterality: Left;  Cryotherapy   Social History   Socioeconomic History   Marital status: Divorced    Spouse name: Not on file   Number of children: 1   Years of education: Not on file   Highest education  level: Not on file  Occupational History    Employer: UNEMPLOYED  Tobacco Use   Smoking status: Former    Packs/day: 1.00    Years: 0.00    Total pack years: 0.00    Types: Cigarettes    Quit date: 04/11/1996    Years since quitting: 25.9   Smokeless tobacco: Never   Tobacco comments:    quit a couple year ago  Vaping Use   Vaping Use: Never used  Substance and Sexual Activity   Alcohol use: No   Drug use: No   Sexual activity: Never  Other Topics Concern   Not on  file  Social History Narrative   Lives with parents.    Social Determinants of Health   Financial Resource Strain: Not on file  Food Insecurity: No Food Insecurity (12/17/2021)   Hunger Vital Sign    Worried About Running Out of Food in the Last Year: Never true    Ran Out of Food in the Last Year: Never true  Transportation Needs: No Transportation Needs (12/17/2021)   PRAPARE - Hydrologist (Medical): No    Lack of Transportation (Non-Medical): No  Physical Activity: Not on file  Stress: Not on file  Social Connections: Not on file   Outpatient Encounter Medications as of 03/09/2022  Medication Sig   acetaminophen (TYLENOL) 325 MG tablet Take 325 mg by mouth every 12 (twelve) hours as needed for moderate pain.   albuterol (PROVENTIL HFA;VENTOLIN HFA) 108 (90 Base) MCG/ACT inhaler Inhale 2 puffs into the lungs every 4 (four) hours as needed for shortness of breath.   apixaban (ELIQUIS) 5 MG TABS tablet Take 1 tablet (5 mg total) by mouth 2 (two) times daily.   azithromycin (ZITHROMAX) 250 MG tablet Take 250 mg by mouth as directed.   betamethasone dipropionate (DIPROLENE) 0.05 % ointment Apply 1 application  topically 2 (two) times daily as needed (for rash under breasts).   calcitRIOL (ROCALTROL) 0.25 MCG capsule Take 0.25 mcg by mouth every Monday, Wednesday, and Friday.   cetirizine (ZYRTEC) 10 MG tablet Take 10 mg by mouth daily.   Cholecalciferol (VITAMIN D3) 50 MCG (2000 UT) TABS TAKE 1 TABLET BY MOUTH ONCE DAILY.   clotrimazole (LOTRIMIN) 1 % cream Apply 1 application  topically 2 (two) times daily as needed (for rash under breasts).   diltiazem (CARDIZEM CD) 240 MG 24 hr capsule Take 1 capsule (240 mg total) by mouth daily.   DROPSAFE SAFETY PEN NEEDLES 31G X 6 MM MISC    EASYMAX TEST test strip CHECK BLOOD SUGAR 4 TIMES A DAY. (BREAKFAST, LUNCH, SUPPER & BEDTIME)   ferrous sulfate 325 (65 FE) MG tablet Take 1 tablet (325 mg total) by mouth daily  with breakfast.   Finerenone (KERENDIA) 10 MG TABS Take 10 mg by mouth daily.   fluticasone (FLONASE) 50 MCG/ACT nasal spray Place 2 sprays into both nostrils daily as needed for allergies.   Fluticasone-Umeclidin-Vilant (TRELEGY ELLIPTA) 100-62.5-25 MCG/ACT AEPB Inhale 1 puff into the lungs daily.   furosemide (LASIX) 40 MG tablet Take 20 mg by mouth See admin instructions. Take 20 mg twice daily, alternating with 20 mg daily every other day   GERI-TUSSIN DM 10-100 MG/5ML liquid Take by mouth.   HUMULIN R U-500 KWIKPEN 500 UNIT/ML KwikPen INJECT 40 UNITS SUBCUTANEOUSLY AT BREAKFAST; INJECT 30 UNITS AT LUNCH & SUPPER; WHEN GLUCOSE IS ABOVE 90 & EATING.(HOLD IF BS BELOW 70: CALL MD IF BS ABOVE 400 (Patient  taking differently: 30-40 Units See admin instructions. Inject 40 units at breakfast, Inject 30 units at lunch and supper; when glucose is above 90 and eating (hold if BS below 70: Call MD if BS above 400))   hydrOXYzine (ATARAX) 25 MG tablet Take 25 mg by mouth 3 (three) times daily.   ipratropium-albuterol (DUONEB) 0.5-2.5 (3) MG/3ML SOLN Take 3 mLs by nebulization every 6 (six) hours as needed (for cough).   isosorbide dinitrate (ISORDIL) 10 MG tablet Take 1 tablet (10 mg total) by mouth 3 (three) times daily.   Lancets 28G MISC Use to test BG qid. E11.65   loperamide (IMODIUM) 2 MG capsule Take 2-4 mg by mouth See admin instructions. Take 4 mg by mouth now: then 2 mg after each loose stool as needed, do not exceed 5 doses in 24 hours.   losartan (COZAAR) 25 MG tablet Take 12.5 mg by mouth daily.   metolazone (ZAROXOLYN) 5 MG tablet Take 5 mg by mouth in the morning.   metoprolol tartrate (LOPRESSOR) 50 MG tablet Take 50 mg by mouth 2 (two) times daily.   montelukast (SINGULAIR) 10 MG tablet Take 10 mg by mouth at bedtime.   pantoprazole (PROTONIX) 40 MG tablet Take one po 30 minutes prior to breakfast daily.   polyethylene glycol powder (GLYCOLAX/MIRALAX) 17 GM/SCOOP powder Take 17 g by mouth  daily as needed for mild constipation or moderate constipation.   potassium chloride SA (KLOR-CON M) 20 MEQ tablet Take 20 mEq by mouth daily.   rosuvastatin (CRESTOR) 20 MG tablet TAKE (1) TABLET BY MOUTH ONCE DAILY. (Patient taking differently: Take 20 mg by mouth daily.)   triamcinolone cream (KENALOG) 0.5 % Apply 1 application topically every 12 (twelve) hours as needed (applied to bilateral legs for allergic dermatitis).   No facility-administered encounter medications on file as of 03/09/2022.   ALLERGIES: No Known Allergies VACCINATION STATUS: Immunization History  Administered Date(s) Administered   Hepatitis B, adult 07/29/2018, 08/29/2018, 10/07/2018   Influenza Inj Mdck Quad Pf 12/19/2016   Influenza,inj,Quad PF,6+ Mos 02/02/2013, 05/15/2018   Influenza-Unspecified 05/15/2018   PPD Test 07/22/2018   Pneumococcal Polysaccharide-23 05/15/2018   Pneumococcal-Unspecified 05/15/2018    Diabetes She presents for her follow-up diabetic visit. She has type 2 diabetes mellitus. Onset time: she was diagnosed at approximate age of 18 years. Her disease course has been improving. There are no hypoglycemic associated symptoms. Pertinent negatives for hypoglycemia include no confusion, headaches, pallor or seizures. Pertinent negatives for diabetes include no blurred vision, no chest pain, no fatigue, no polydipsia, no polyphagia and no polyuria. There are no hypoglycemic complications. Symptoms are improving. Diabetic complications include nephropathy. Risk factors for coronary artery disease include diabetes mellitus, dyslipidemia, hypertension, obesity and sedentary lifestyle. Current diabetic treatment includes intensive insulin program. Her weight is fluctuating minimally. She is following a generally unhealthy diet. She has had a previous visit with a dietitian. She never participates in exercise. Her home blood glucose trend is decreasing steadily. Her breakfast blood glucose range is  generally 140-180 mg/dl. Her lunch blood glucose range is generally 140-180 mg/dl. Her dinner blood glucose range is generally 140-180 mg/dl. Her bedtime blood glucose range is generally 140-180 mg/dl. Her overall blood glucose range is 140-180 mg/dl. (She is accompanied by her aide from nursing home.   Her glycemic profile was reviewed found to be near target.  Her recent A1c was 5.1%.  Her logs do not show any recent hypoglycemia.  ) An ACE inhibitor/angiotensin II receptor blocker is  being taken.  Hypertension This is a chronic problem. The current episode started more than 1 year ago. The problem is uncontrolled. Pertinent negatives include no blurred vision, chest pain, headaches, palpitations or shortness of breath. Risk factors for coronary artery disease include diabetes mellitus, dyslipidemia, obesity and sedentary lifestyle. Past treatments include ACE inhibitors. Hypertensive end-organ damage includes kidney disease. Identifiable causes of hypertension include chronic renal disease.  Hyperlipidemia This is a chronic problem. The current episode started more than 1 year ago. The problem is uncontrolled. Exacerbating diseases include chronic renal disease, diabetes and obesity. Pertinent negatives include no chest pain, myalgias or shortness of breath. Current antihyperlipidemic treatment includes statins and fibric acid derivatives. Risk factors for coronary artery disease include diabetes mellitus, dyslipidemia, obesity, a sedentary lifestyle and hypertension.    Review of Systems  Constitutional:  Negative for chills, fatigue and fever.  HENT:  Negative for trouble swallowing and voice change.   Eyes:  Negative for blurred vision and visual disturbance.  Respiratory:  Negative for cough, shortness of breath and wheezing.   Cardiovascular:  Negative for chest pain, palpitations and leg swelling.  Gastrointestinal:  Negative for diarrhea, nausea and vomiting.  Endocrine: Negative for cold  intolerance, heat intolerance, polydipsia, polyphagia and polyuria.  Musculoskeletal:  Positive for gait problem. Negative for arthralgias and myalgias.  Skin:  Negative for color change, pallor, rash and wound.  Neurological:  Negative for seizures and headaches.  Hematological:  Does not bruise/bleed easily.  Psychiatric/Behavioral:  Negative for confusion and suicidal ideas.     Objective:    BP 104/66   Pulse 64   Ht 5\' 2"  (1.575 m)   Wt 251 lb 6.4 oz (114 kg)   BMI 45.98 kg/m   Wt Readings from Last 3 Encounters:  03/09/22 251 lb 6.4 oz (114 kg)  02/24/22 250 lb 12.8 oz (113.8 kg)  01/30/22 252 lb 11.2 oz (114.6 kg)      Results for orders placed or performed in visit on 01/30/22  Chromogranin A  Result Value Ref Range   Chromogranin A (ng/mL) 571.9 (H) 0.0 - 101.8 ng/mL  CMP (Cancer Center only)  Result Value Ref Range   Sodium 136 135 - 145 mmol/L   Potassium 3.4 (L) 3.5 - 5.1 mmol/L   Chloride 99 98 - 111 mmol/L   CO2 29 22 - 32 mmol/L   Glucose, Bld 226 (H) 70 - 99 mg/dL   BUN 42 (H) 8 - 23 mg/dL   Creatinine 1.78 (H) 0.44 - 1.00 mg/dL   Calcium 9.4 8.9 - 10.3 mg/dL   Total Protein 7.6 6.5 - 8.1 g/dL   Albumin 3.9 3.5 - 5.0 g/dL   AST 12 (L) 15 - 41 U/L   ALT 7 0 - 44 U/L   Alkaline Phosphatase 74 38 - 126 U/L   Total Bilirubin 0.3 0.3 - 1.2 mg/dL   GFR, Estimated 31 (L) >60 mL/min   Anion gap 8 5 - 15  CBC with Differential (Cancer Center Only)  Result Value Ref Range   WBC Count 10.2 4.0 - 10.5 K/uL   RBC 3.28 (L) 3.87 - 5.11 MIL/uL   Hemoglobin 9.4 (L) 12.0 - 15.0 g/dL   HCT 29.5 (L) 36.0 - 46.0 %   MCV 89.9 80.0 - 100.0 fL   MCH 28.7 26.0 - 34.0 pg   MCHC 31.9 30.0 - 36.0 g/dL   RDW 15.6 (H) 11.5 - 15.5 %   Platelet Count 250 150 - 400 K/uL  nRBC 0.0 0.0 - 0.2 %   Neutrophils Relative % 81 %   Neutro Abs 8.3 (H) 1.7 - 7.7 K/uL   Lymphocytes Relative 9 %   Lymphs Abs 0.9 0.7 - 4.0 K/uL   Monocytes Relative 7 %   Monocytes Absolute 0.7 0.1 -  1.0 K/uL   Eosinophils Relative 2 %   Eosinophils Absolute 0.3 0.0 - 0.5 K/uL   Basophils Relative 0 %   Basophils Absolute 0.0 0.0 - 0.1 K/uL   Immature Granulocytes 1 %   Abs Immature Granulocytes 0.05 0.00 - 0.07 K/uL   Complete Blood Count (Most recent): Lab Results  Component Value Date   WBC 10.2 01/30/2022   HGB 9.4 (L) 01/30/2022   HCT 29.5 (L) 01/30/2022   MCV 89.9 01/30/2022   PLT 250 01/30/2022   Chemistry (most recent): Diabetic Labs (most recent): Lab Results  Component Value Date   HGBA1C 5.1 12/17/2021   HGBA1C 6.2 11/05/2021   HGBA1C 7.0 07/31/2021   MICROALBUR 150 04/25/2020   MICROALBUR 23.4 09/12/2019   MICROALBUR 0.2 07/13/2017   Lipid Panel     Component Value Date/Time   CHOL 112 03/02/2022 0831   TRIG 190 (H) 03/02/2022 0831   HDL 39 (L) 03/02/2022 0831   CHOLHDL 2.9 03/02/2022 0831   CHOLHDL 5.5 (H) 09/12/2019 0831   VLDL 59 (H) 06/02/2018 0029   LDLCALC 42 03/02/2022 0831   LDLCALC 124 (H) 09/12/2019 0831    Assessment & Plan:   1. Type 2 diabetes mellitus with stage 3-4 chronic kidney disease, with long-term current use of insulin   -Her diabetes is  complicated by CKD, congestive heart failure,  obesity/sedentary life, and patient remains at extremely high risk for more acute and chronic complications of diabetes which include CAD, CVA, CKD, retinopathy, and neuropathy. These are all discussed in detail with the patient.  She is accompanied by her aide from nursing home.   Her glycemic profile was reviewed found to be near target.  Her recent A1c was 5.1%.  Her logs do not show any recent hypoglycemia.    - I have re-counseled the patient on diet management and weight loss  by adopting a carbohydrate restricted / protein rich  Diet.  - she acknowledges that there is a room for improvement in her food and drink choices. - Suggestion is made for her to avoid simple carbohydrates  from her diet including Cakes, Sweet Desserts, Ice Cream,  Soda (diet and regular), Sweet Tea, Candies, Chips, Cookies, Store Bought Juices, Alcohol in Excess of  1-2 drinks a day, Artificial Sweeteners,  Coffee Creamer, and "Sugar-free" Products, Lemonade. This will help patient to have more stable blood glucose profile and potentially avoid unintended weight gain.   - Patient is advised to stick to a routine mealtimes to eat 3 meals  a day and avoid unnecessary snacks ( to snack only to correct hypoglycemia).  - I have approached patient with the following individualized plan to manage diabetes and patient agrees.  -Her placement in group home is the best development for her lately.  -Her presenting glycemic profile is on target, with recent A1c of 5.1%, her logs were reviewed and did not show any hypoglycemia. -It is better for her to stay on Humulin RU 500 insulin for the sake of simplicity.  -She is advised to continue  Humulin U500 40 units at breakfast, 30 units at lunch, 30 units with supper when glucose is above 90 mg/dl associated with monitoring of  blood glucose before meals and at bedtime. - She is advised to skip insulin if pre-meal blood glucose is below 90 mg/dL or if she is not eating. -She is not a candidate for metformin.   - Patient specific target  for A1c; LDL, HDL, Triglycerides, were discussed in detail.  2) BP/HTN:  -Her blood pressure is controlled to target.  She has urine microalbuminuria .She is advised on  salt restriction  and I advised her to continue current medications including lisinopril 20 mg p.o. daily, hydralazine 25 mg p.o. 3 times daily, furosemide 40 mg p.o. Daily.  3) Lipids/HPL: Her recent lipid panel showed significantly improved LDL to 42.  She is advised to continue Crestor 20 mg p.o. nightly.   Side effects and precautions discussed with her.    She is also on fenofibrate.     4)  Weight/Diet: Her BMI is 29.79- clearly complicating her diabetes care.   She is a candidate for modest weight loss.  CDE  consult in progress, exercise, and carbohydrates information provided.  5) Chronic Care/Health Maintenance:  -Patient is  on ACEI and encouraged to continue to follow up with Ophthalmology, Podiatrist at least yearly or according to recommendations, and advised to stay away from smoking. I have recommended yearly flu vaccine and pneumonia vaccination at least every 5 years;  and  sleep for at least 7 hours a day.    -She is advised to maintain close follow-up with her nephrologist.  - I advised patient to maintain close follow up with Legrand Rams, Normajean Baxter, MD for primary care needs.  I spent 28 minutes in the care of the patient today including review of labs from [place], Lipids, Thyroid Function, Hematology (current and previous including abstractions from other facilities); face-to-face time discussing  her blood glucose readings/logs, discussing hypoglycemia and hyperglycemia episodes and symptoms, medications doses, her options of short and long term treatment based on the latest standards of care / guidelines;  discussion about incorporating lifestyle medicine;  and documenting the encounter. Risk reduction counseling performed per USPSTF guidelines to reduce  obesity and cardiovascular risk factors.     Please refer to Patient Instructions for Blood Glucose Monitoring and Insulin/Medications Dosing Guide"  in media tab for additional information. Please  also refer to " Patient Self Inventory" in the Media  tab for reviewed elements of pertinent patient history.  [name] participated in the discussions, expressed understanding, and voiced agreement with the above plans.  All questions were answered to her satisfaction. she is encouraged to contact clinic should she have any questions or concerns prior to her return visit.   Follow up plan: -Return in about 3 months (around 06/08/2022) for Bring Meter/CGM Device/Logs- A1c in Office.  Glade Lloyd, MD Phone: [phone]  Fax:  [phone]  This note was partially dictated with voice recognition software. Similar sounding words can be transcribed inadequately or may not  be corrected upon review.  03/09/2022, 10:47 AM

## 2022-03-09 NOTE — Patient Instructions (Signed)
                                     Advice for Weight Management  -For most of us the best way to lose weight is by diet management. Generally speaking, diet management means consuming less calories intentionally which over time brings about progressive weight loss.  This can be achieved more effectively by avoiding ultra processed carbohydrates, processed meats, unhealthy fats.    It is critically important to know your numbers: how much calorie you are consuming and how much calorie you need. More importantly, our carbohydrates sources should be unprocessed naturally occurring  complex starch food items.  It is always important to balance nutrition also by  appropriate intake of proteins (mainly plant-based), healthy fats/oils, plenty of fruits and vegetables.   -The American College of Lifestyle Medicine (ACL M) recommends nutrition derived mostly from Whole Food, Plant Predominant Sources example an apple instead of applesauce or apple pie. Eat Plenty of vegetables, Mushrooms, fruits, Legumes, Whole Grains, Nuts, seeds in lieu of processed meats, processed snacks/pastries red meat, poultry, eggs.  Use only water or unsweetened tea for hydration.  The College also recommends the need to stay away from risky substances including alcohol, smoking; obtaining 7-9 hours of restorative sleep, at least 150 minutes of moderate intensity exercise weekly, importance of healthy social connections, and being mindful of stress and seek help when it is overwhelming.    -Sticking to a routine mealtime to eat 3 meals a day and avoiding unnecessary snacks is shown to have a big role in weight control. Under normal circumstances, the only time we burn stored energy is when we are hungry, so allow  some hunger to take place- hunger means no food between appropriate meal times, only water.  It is not advisable to starve.   -It is better to avoid simple carbohydrates including:  Cakes, Sweet Desserts, Ice Cream, Soda (diet and regular), Sweet Tea, Candies, Chips, Cookies, Store Bought Juices, Alcohol in Excess of  1-2 drinks a day, Lemonade,  Artificial Sweeteners, Doughnuts, Coffee Creamers, "Sugar-free" Products, etc, etc.  This is not a complete list.....    -Consulting with certified diabetes educators is proven to provide you with the most accurate and current information on diet.  Also, you may be  interested in discussing diet options/exchanges , we can schedule a visit with [name], RDN, CDE for individualized nutrition education.  -Exercise: If you are able: 30 -60 minutes a day ,4 days a week, or 150 minutes of moderate intensity exercise weekly.    The longer the better if tolerated.  Combine stretch, strength, and aerobic activities.  If you were told in the past that you have high risk for cardiovascular diseases, or if you are currently symptomatic, you may seek evaluation by your heart doctor prior to initiating moderate to intense exercise programs.                                  Additional Care Considerations for Diabetes/Prediabetes   -Diabetes  is a chronic disease.  The most important care consideration is regular follow-up with your diabetes care provider with the goal being avoiding or delaying its complications and to take advantage of advances in medications and technology.  If appropriate actions are taken early enough, type 2 diabetes can even be   reversed.  Seek information from the right source.  - Whole Food, Plant Predominant Nutrition is highly recommended: Eat Plenty of vegetables, Mushrooms, fruits, Legumes, Whole Grains, Nuts, seeds in lieu of processed meats, processed snacks/pastries red meat, poultry, eggs as recommended by American College of  Lifestyle Medicine (ACLM).  -Type 2 diabetes is known to coexist with other important comorbidities such as high blood pressure and high cholesterol.  It is critical to control not only the  diabetes but also the high blood pressure and high cholesterol to minimize and delay the risk of complications including coronary artery disease, stroke, amputations, blindness, etc.  The good news is that this diet recommendation for type 2 diabetes is also very helpful for managing high cholesterol and high blood blood pressure.  - Studies showed that people with diabetes will benefit from a class of medications known as ACE inhibitors and statins.  Unless there are specific reasons not to be on these medications, the standard of care is to consider getting one from these groups of medications at an optimal doses.  These medications are generally considered safe and proven to help protect the heart and the kidneys.    - People with diabetes are encouraged to initiate and maintain regular follow-up with eye doctors, foot doctors, dentists , and if necessary heart and kidney doctors.     - It is highly recommended that people with diabetes quit smoking or stay away from smoking, and get yearly  flu vaccine and pneumonia vaccine at least every 5 years.  See above for additional recommendations on exercise, sleep, stress management , and healthy social connections.      

## 2022-03-10 DIAGNOSIS — M79674 Pain in right toe(s): Secondary | ICD-10-CM | POA: Diagnosis not present

## 2022-03-10 DIAGNOSIS — B351 Tinea unguium: Secondary | ICD-10-CM | POA: Diagnosis not present

## 2022-03-10 DIAGNOSIS — M79675 Pain in left toe(s): Secondary | ICD-10-CM | POA: Diagnosis not present

## 2022-03-11 DIAGNOSIS — M6281 Muscle weakness (generalized): Secondary | ICD-10-CM | POA: Diagnosis not present

## 2022-03-16 ENCOUNTER — Other Ambulatory Visit: Payer: Self-pay | Admitting: "Endocrinology

## 2022-03-16 DIAGNOSIS — M6281 Muscle weakness (generalized): Secondary | ICD-10-CM | POA: Diagnosis not present

## 2022-03-18 DIAGNOSIS — M6281 Muscle weakness (generalized): Secondary | ICD-10-CM | POA: Diagnosis not present

## 2022-03-19 DIAGNOSIS — N184 Chronic kidney disease, stage 4 (severe): Secondary | ICD-10-CM | POA: Diagnosis not present

## 2022-03-19 DIAGNOSIS — I110 Hypertensive heart disease with heart failure: Secondary | ICD-10-CM | POA: Diagnosis not present

## 2022-03-24 DIAGNOSIS — M6281 Muscle weakness (generalized): Secondary | ICD-10-CM | POA: Diagnosis not present

## 2022-03-25 DIAGNOSIS — M6281 Muscle weakness (generalized): Secondary | ICD-10-CM | POA: Diagnosis not present

## 2022-04-01 DIAGNOSIS — E1165 Type 2 diabetes mellitus with hyperglycemia: Secondary | ICD-10-CM | POA: Diagnosis not present

## 2022-04-01 DIAGNOSIS — N1831 Chronic kidney disease, stage 3a: Secondary | ICD-10-CM | POA: Diagnosis not present

## 2022-04-01 DIAGNOSIS — Z0001 Encounter for general adult medical examination with abnormal findings: Secondary | ICD-10-CM | POA: Diagnosis not present

## 2022-04-01 DIAGNOSIS — I5032 Chronic diastolic (congestive) heart failure: Secondary | ICD-10-CM | POA: Diagnosis not present

## 2022-04-01 DIAGNOSIS — I10 Essential (primary) hypertension: Secondary | ICD-10-CM | POA: Diagnosis not present

## 2022-04-19 DIAGNOSIS — N184 Chronic kidney disease, stage 4 (severe): Secondary | ICD-10-CM | POA: Diagnosis not present

## 2022-04-19 DIAGNOSIS — I5032 Chronic diastolic (congestive) heart failure: Secondary | ICD-10-CM | POA: Diagnosis not present

## 2022-04-24 DIAGNOSIS — E211 Secondary hyperparathyroidism, not elsewhere classified: Secondary | ICD-10-CM | POA: Diagnosis not present

## 2022-04-24 DIAGNOSIS — R809 Proteinuria, unspecified: Secondary | ICD-10-CM | POA: Diagnosis not present

## 2022-04-24 DIAGNOSIS — D381 Neoplasm of uncertain behavior of trachea, bronchus and lung: Secondary | ICD-10-CM | POA: Diagnosis not present

## 2022-04-24 DIAGNOSIS — N189 Chronic kidney disease, unspecified: Secondary | ICD-10-CM | POA: Diagnosis not present

## 2022-04-24 DIAGNOSIS — E876 Hypokalemia: Secondary | ICD-10-CM | POA: Diagnosis not present

## 2022-04-24 DIAGNOSIS — E1122 Type 2 diabetes mellitus with diabetic chronic kidney disease: Secondary | ICD-10-CM | POA: Diagnosis not present

## 2022-04-24 DIAGNOSIS — D631 Anemia in chronic kidney disease: Secondary | ICD-10-CM | POA: Diagnosis not present

## 2022-04-24 DIAGNOSIS — E1129 Type 2 diabetes mellitus with other diabetic kidney complication: Secondary | ICD-10-CM | POA: Diagnosis not present

## 2022-04-24 DIAGNOSIS — I5032 Chronic diastolic (congestive) heart failure: Secondary | ICD-10-CM | POA: Diagnosis not present

## 2022-05-04 DIAGNOSIS — E1122 Type 2 diabetes mellitus with diabetic chronic kidney disease: Secondary | ICD-10-CM | POA: Diagnosis not present

## 2022-05-04 DIAGNOSIS — D631 Anemia in chronic kidney disease: Secondary | ICD-10-CM | POA: Diagnosis not present

## 2022-05-04 DIAGNOSIS — E1129 Type 2 diabetes mellitus with other diabetic kidney complication: Secondary | ICD-10-CM | POA: Diagnosis not present

## 2022-05-04 DIAGNOSIS — N189 Chronic kidney disease, unspecified: Secondary | ICD-10-CM | POA: Diagnosis not present

## 2022-05-04 DIAGNOSIS — E211 Secondary hyperparathyroidism, not elsewhere classified: Secondary | ICD-10-CM | POA: Diagnosis not present

## 2022-05-04 DIAGNOSIS — I5032 Chronic diastolic (congestive) heart failure: Secondary | ICD-10-CM | POA: Diagnosis not present

## 2022-05-04 DIAGNOSIS — E876 Hypokalemia: Secondary | ICD-10-CM | POA: Diagnosis not present

## 2022-05-04 DIAGNOSIS — R809 Proteinuria, unspecified: Secondary | ICD-10-CM | POA: Diagnosis not present

## 2022-05-04 DIAGNOSIS — D381 Neoplasm of uncertain behavior of trachea, bronchus and lung: Secondary | ICD-10-CM | POA: Diagnosis not present

## 2022-05-15 ENCOUNTER — Other Ambulatory Visit: Payer: Self-pay | Admitting: "Endocrinology

## 2022-05-25 DIAGNOSIS — B351 Tinea unguium: Secondary | ICD-10-CM | POA: Diagnosis not present

## 2022-05-25 DIAGNOSIS — M79675 Pain in left toe(s): Secondary | ICD-10-CM | POA: Diagnosis not present

## 2022-05-25 DIAGNOSIS — M79674 Pain in right toe(s): Secondary | ICD-10-CM | POA: Diagnosis not present

## 2022-05-29 DIAGNOSIS — I5032 Chronic diastolic (congestive) heart failure: Secondary | ICD-10-CM | POA: Diagnosis not present

## 2022-05-29 DIAGNOSIS — N184 Chronic kidney disease, stage 4 (severe): Secondary | ICD-10-CM | POA: Diagnosis not present

## 2022-06-08 ENCOUNTER — Ambulatory Visit (INDEPENDENT_AMBULATORY_CARE_PROVIDER_SITE_OTHER): Payer: Medicare Other | Admitting: "Endocrinology

## 2022-06-08 ENCOUNTER — Encounter: Payer: Self-pay | Admitting: "Endocrinology

## 2022-06-08 VITALS — BP 110/68 | HR 56 | Ht 62.0 in | Wt 255.2 lb

## 2022-06-08 DIAGNOSIS — N184 Chronic kidney disease, stage 4 (severe): Secondary | ICD-10-CM | POA: Diagnosis not present

## 2022-06-08 DIAGNOSIS — I10 Essential (primary) hypertension: Secondary | ICD-10-CM | POA: Diagnosis not present

## 2022-06-08 DIAGNOSIS — E1122 Type 2 diabetes mellitus with diabetic chronic kidney disease: Secondary | ICD-10-CM

## 2022-06-08 DIAGNOSIS — Z794 Long term (current) use of insulin: Secondary | ICD-10-CM | POA: Diagnosis not present

## 2022-06-08 DIAGNOSIS — E782 Mixed hyperlipidemia: Secondary | ICD-10-CM | POA: Diagnosis not present

## 2022-06-08 NOTE — Patient Instructions (Signed)

## 2022-06-08 NOTE — Progress Notes (Unsigned)
06/08/2022  Endocrinology follow-up note   Subjective:    Patient ID: [name], female    DOB: 05-19-1955, PCP Carrolyn Meiers, MD   Past Medical History:  Diagnosis Date   Arthritis    Asthma    Atrial fibrillation ([place])    Carcinoid tumor determined by biopsy of lung 05/2018   Left lung   Chronic diastolic CHF (congestive heart failure) ([place])    CKD (chronic kidney disease), stage III ([place])    Diabetes mellitus type 2 in obese [facility])    Essential hypertension    Iron deficiency anemia 10/16/2010   Mental handicap 10/16/2010   Mild CAD 2013   Morbid obesity ([place])    OSA (obstructive sleep apnea)    Past Surgical History:  Procedure Laterality Date   ABDOMINAL HYSTERECTOMY     AV FISTULA PLACEMENT Right 06/06/2018   Procedure: ARTERIOVENOUS (AV) FISTULA CREATION;  Surgeon: Marty Heck, MD;  Location: [place];  Service: Vascular;  Laterality: Right;   BIOPSY  10/16/2021   Procedure: BIOPSY;  Surgeon: Daneil Dolin, MD;  [facility];  Service: Endoscopy;;   BRONCHIAL DILITATION  12/13/2020   Procedure: BRONCHIAL DILITATION;  Surgeon: Garner Nash, DO;  Location: [facility];  Service: Pulmonary;;   CESAREAN SECTION     CHOLECYSTECTOMY     COLONOSCOPY  08/2010   normal TI, sigmoid polyp (adenoma ). Next TCS due  08/2015,   COLONOSCOPY WITH PROPOFOL N/A 07/08/2020   Procedure: COLONOSCOPY WITH PROPOFOL;  Surgeon: Daneil Dolin, MD;  [facility];  Service: Endoscopy;  Laterality: N/A;  AM (diabetic and facility patient)   ESOPHAGOGASTRODUODENOSCOPY  08/2010   antral and duodenal erosions s/p bx (chronic gastritis, no h.pylori, no celiac dz ), hiatal hernia   ESOPHAGOGASTRODUODENOSCOPY (EGD) WITH PROPOFOL N/A 10/16/2021   Procedure: ESOPHAGOGASTRODUODENOSCOPY (EGD) WITH PROPOFOL;  Surgeon: Daneil Dolin, MD;  [facility];  Service: Endoscopy;  Laterality: N/A;  11:45am   ESOPHAGOGASTRODUODENOSCOPY (EGD) WITH PROPOFOL  N/A 12/18/2021   Procedure: ESOPHAGOGASTRODUODENOSCOPY (EGD) WITH PROPOFOL;  Surgeon: [name], DO;  [facility];  Service: Endoscopy;  Laterality: N/A;   FISTULA SUPERFICIALIZATION Right 08/25/2018   Procedure: FISTULA SUPERFICIALIZATION RIGHT ARM;  Surgeon: Waynetta Sandy, MD;  Location: [place];  Service: Cardiovascular;  Laterality: Right;   HOT HEMOSTASIS  12/18/2021   Procedure: HOT HEMOSTASIS (ARGON PLASMA COAGULATION/BICAP);  Surgeon: [name], DO;  [facility];  Service: Endoscopy;;   IR FLUORO GUIDE CV LINE RIGHT  05/31/2018   IR US GUIDE VASC ACCESS RIGHT  05/31/2018   KNEE SURGERY     right knee @ 66 years of age   LEFT AND RIGHT HEART CATHETERIZATION WITH CORONARY ANGIOGRAM N/A 09/21/2011   Procedure: LEFT AND RIGHT HEART CATHETERIZATION WITH CORONARY ANGIOGRAM;  Surgeon: Birdie Riddle, MD;  Location: [facility];  Service: Cardiovascular;  Laterality: N/A;   POLYPECTOMY  07/08/2020   Procedure: POLYPECTOMY INTESTINAL;  Surgeon: Daneil Dolin, MD;  [facility];  Service: Endoscopy;;   VIDEO BRONCHOSCOPY Left 12/13/2020   Procedure: VIDEO BRONCHOSCOPY WITHOUT FLUORO;  Surgeon: Garner Nash, DO;  Location: [place];  Service: Pulmonary;  Laterality: Left;  Cryotherapy   Social History   Socioeconomic History   Marital status: Divorced    Spouse name: Not on file   Number of children: 1   Years of education: Not on file   Highest education  level: Not on file  Occupational History    Employer: UNEMPLOYED  Tobacco Use   Smoking status: Former    Packs/day: 1.00    Years: 0.00    Total pack years: 0.00    Types: Cigarettes    Quit date: 04/11/1996    Years since quitting: 26.1   Smokeless tobacco: Never   Tobacco comments:    quit a couple year ago  Vaping Use   Vaping Use: Never used  Substance and Sexual Activity   Alcohol use: No   Drug use: No   Sexual activity: Never  Other Topics Concern   Not on  file  Social History Narrative   Lives with parents.    Social Determinants of Health   Financial Resource Strain: Not on file  Food Insecurity: No Food Insecurity (12/17/2021)   Hunger Vital Sign    Worried About Running Out of Food in the Last Year: Never true    Ran Out of Food in the Last Year: Never true  Transportation Needs: No Transportation Needs (12/17/2021)   PRAPARE - Hydrologist (Medical): No    Lack of Transportation (Non-Medical): No  Physical Activity: Not on file  Stress: Not on file  Social Connections: Not on file   Outpatient Encounter Medications as of 06/08/2022  Medication Sig   acetaminophen (TYLENOL) 325 MG tablet Take 325 mg by mouth every 12 (twelve) hours as needed for moderate pain.   albuterol (PROVENTIL HFA;VENTOLIN HFA) 108 (90 Base) MCG/ACT inhaler Inhale 2 puffs into the lungs every 4 (four) hours as needed for shortness of breath.   apixaban (ELIQUIS) 5 MG TABS tablet Take 1 tablet (5 mg total) by mouth 2 (two) times daily.   azithromycin (ZITHROMAX) 250 MG tablet Take 250 mg by mouth as directed.   betamethasone dipropionate (DIPROLENE) 0.05 % ointment Apply 1 application  topically 2 (two) times daily as needed (for rash under breasts).   calcitRIOL (ROCALTROL) 0.25 MCG capsule Take 0.25 mcg by mouth every Monday, Wednesday, and Friday.   cetirizine (ZYRTEC) 10 MG tablet Take 10 mg by mouth daily.   Cholecalciferol (VITAMIN D3) 50 MCG (2000 UT) TABS TAKE 1 TABLET BY MOUTH ONCE DAILY.   clotrimazole (LOTRIMIN) 1 % cream Apply 1 application  topically 2 (two) times daily as needed (for rash under breasts).   diltiazem (CARDIZEM CD) 240 MG 24 hr capsule Take 1 capsule (240 mg total) by mouth daily.   DROPSAFE SAFETY PEN NEEDLES 31G X 6 MM MISC    EASYMAX TEST test strip CHECK BLOOD SUGAR 4 TIMES A DAY. (BREAKFAST, LUNCH, SUPPER & BEDTIME)   ferrous sulfate 325 (65 FE) MG tablet Take 1 tablet (325 mg total) by mouth daily  with breakfast.   Finerenone (KERENDIA) 10 MG TABS Take 10 mg by mouth daily.   fluticasone (FLONASE) 50 MCG/ACT nasal spray Place 2 sprays into both nostrils daily as needed for allergies.   Fluticasone-Umeclidin-Vilant (TRELEGY ELLIPTA) 100-62.5-25 MCG/ACT AEPB Inhale 1 puff into the lungs daily.   furosemide (LASIX) 40 MG tablet Take 20 mg by mouth See admin instructions. Take 20 mg twice daily, alternating with 20 mg daily every other day   GERI-TUSSIN DM 10-100 MG/5ML liquid Take by mouth.   HUMULIN R U-500 KWIKPEN 500 UNIT/ML KwikPen INJECT 40 UNITS SUBCUTANEOUSLY AT BREAKFAST; INJECT 30 UNITS AT LUNCH & SUPPER; WHEN GLUCOSE IS ABOVE 90 & EATING.(HOLD IF BS BELOW 70: CALL MD IF BS ABOVE 400 (Patient  taking differently: 30-40 Units See admin instructions. Inject 40 units at breakfast, Inject 30 units at lunch and supper; when glucose is above 90 and eating (hold if BS below 70: Call MD if BS above 400))   hydrOXYzine (ATARAX) 25 MG tablet Take 25 mg by mouth 3 (three) times daily.   ipratropium-albuterol (DUONEB) 0.5-2.5 (3) MG/3ML SOLN Take 3 mLs by nebulization every 6 (six) hours as needed (for cough).   isosorbide dinitrate (ISORDIL) 10 MG tablet Take 1 tablet (10 mg total) by mouth 3 (three) times daily.   Lancets 28G MISC Use to test BG qid. E11.65   loperamide (IMODIUM) 2 MG capsule Take 2-4 mg by mouth See admin instructions. Take 4 mg by mouth now: then 2 mg after each loose stool as needed, do not exceed 5 doses in 24 hours.   losartan (COZAAR) 25 MG tablet Take 12.5 mg by mouth daily.   metolazone (ZAROXOLYN) 5 MG tablet Take 5 mg by mouth in the morning.   metoprolol tartrate (LOPRESSOR) 50 MG tablet Take 50 mg by mouth 2 (two) times daily.   montelukast (SINGULAIR) 10 MG tablet Take 10 mg by mouth at bedtime.   pantoprazole (PROTONIX) 40 MG tablet Take one po 30 minutes prior to breakfast daily.   polyethylene glycol powder (GLYCOLAX/MIRALAX) 17 GM/SCOOP powder Take 17 g by mouth  daily as needed for mild constipation or moderate constipation.   potassium chloride SA (KLOR-CON M) 20 MEQ tablet Take 20 mEq by mouth daily.   rosuvastatin (CRESTOR) 20 MG tablet TAKE (1) TABLET BY MOUTH ONCE DAILY. (Patient taking differently: Take 20 mg by mouth daily.)   triamcinolone cream (KENALOG) 0.5 % Apply 1 application topically every 12 (twelve) hours as needed (applied to bilateral legs for allergic dermatitis).   No facility-administered encounter medications on file as of 06/08/2022.   ALLERGIES: No Known Allergies VACCINATION STATUS: Immunization History  Administered Date(s) Administered   Hepatitis B, ADULT 07/29/2018, 08/29/2018, 10/07/2018   Influenza Inj Mdck Quad Pf 12/19/2016   Influenza,inj,Quad PF,6+ Mos 02/02/2013, 05/15/2018   Influenza-Unspecified 05/15/2018   PPD Test 07/22/2018   Pneumococcal Polysaccharide-23 05/15/2018   Pneumococcal-Unspecified 05/15/2018    Diabetes She presents for her follow-up diabetic visit. She has type 2 diabetes mellitus. Onset time: she was diagnosed at approximate age of 26 years. Her disease course has been improving. There are no hypoglycemic associated symptoms. Pertinent negatives for hypoglycemia include no confusion, headaches, pallor or seizures. Pertinent negatives for diabetes include no blurred vision, no chest pain, no fatigue, no polydipsia, no polyphagia and no polyuria. There are no hypoglycemic complications. Symptoms are improving. Diabetic complications include nephropathy. Risk factors for coronary artery disease include diabetes mellitus, dyslipidemia, hypertension, obesity and sedentary lifestyle. Current diabetic treatment includes intensive insulin program. Her weight is increasing steadily. She is following a generally unhealthy diet. She has had a previous visit with a dietitian. She never participates in exercise. Her home blood glucose trend is fluctuating minimally. Her breakfast blood glucose range is  generally 180-200 mg/dl. Her lunch blood glucose range is generally 180-200 mg/dl. Her dinner blood glucose range is generally 180-200 mg/dl. Her bedtime blood glucose range is generally 180-200 mg/dl. Her overall blood glucose range is 180-200 mg/dl. (She is accompanied by her aide from nursing home.   Her glycemic profile was reviewed found to be near target.  Her point-of-care A1c is 6%.  Her logs did not show any hypoglycemia.  She has mild anemia.   ) An ACE  inhibitor/angiotensin II receptor blocker is being taken.  Hypertension This is a chronic problem. The current episode started more than 1 year ago. The problem is uncontrolled. Pertinent negatives include no blurred vision, chest pain, headaches, palpitations or shortness of breath. Risk factors for coronary artery disease include diabetes mellitus, dyslipidemia, obesity and sedentary lifestyle. Past treatments include ACE inhibitors. Hypertensive end-organ damage includes kidney disease. Identifiable causes of hypertension include chronic renal disease.  Hyperlipidemia This is a chronic problem. The current episode started more than 1 year ago. The problem is uncontrolled. Exacerbating diseases include chronic renal disease, diabetes and obesity. Pertinent negatives include no chest pain, myalgias or shortness of breath. Current antihyperlipidemic treatment includes statins and fibric acid derivatives. Risk factors for coronary artery disease include diabetes mellitus, dyslipidemia, obesity, a sedentary lifestyle and hypertension.    Review of Systems  Constitutional:  Negative for chills, fatigue and fever.  HENT:  Negative for trouble swallowing and voice change.   Eyes:  Negative for blurred vision and visual disturbance.  Respiratory:  Negative for cough, shortness of breath and wheezing.   Cardiovascular:  Negative for chest pain, palpitations and leg swelling.  Gastrointestinal:  Negative for diarrhea, nausea and vomiting.   Endocrine: Negative for cold intolerance, heat intolerance, polydipsia, polyphagia and polyuria.  Musculoskeletal:  Positive for gait problem. Negative for arthralgias and myalgias.  Skin:  Negative for color change, pallor, rash and wound.  Neurological:  Negative for seizures and headaches.  Hematological:  Does not bruise/bleed easily.  Psychiatric/Behavioral:  Negative for confusion and suicidal ideas.     Objective:    BP 110/68   Pulse (!) 56   Ht '5\' 2"'$  (1.575 m)   Wt 255 lb 3.2 oz (115.8 kg)   BMI 46.68 kg/m   Wt Readings from Last 3 Encounters:  06/08/22 255 lb 3.2 oz (115.8 kg)  03/09/22 251 lb 6.4 oz (114 kg)  02/24/22 250 lb 12.8 oz (113.8 kg)      Results for orders placed or performed in visit on 01/30/22  Chromogranin A  Result Value Ref Range   Chromogranin A (ng/mL) 571.9 (H) 0.0 - 101.8 ng/mL  CMP (Cancer Center only)  Result Value Ref Range   Sodium 136 135 - 145 mmol/L   Potassium 3.4 (L) 3.5 - 5.1 mmol/L   Chloride 99 98 - 111 mmol/L   CO2 29 22 - 32 mmol/L   Glucose, Bld 226 (H) 70 - 99 mg/dL   BUN 42 (H) 8 - 23 mg/dL   Creatinine 1.78 (H) 0.44 - 1.00 mg/dL   Calcium 9.4 8.9 - 10.3 mg/dL   Total Protein 7.6 6.5 - 8.1 g/dL   Albumin 3.9 3.5 - 5.0 g/dL   AST 12 (L) 15 - 41 U/L   ALT 7 0 - 44 U/L   Alkaline Phosphatase 74 38 - 126 U/L   Total Bilirubin 0.3 0.3 - 1.2 mg/dL   GFR, Estimated 31 (L) >60 mL/min   Anion gap 8 5 - 15  CBC with Differential (Cancer Center Only)  Result Value Ref Range   WBC Count 10.2 4.0 - 10.5 K/uL   RBC 3.28 (L) 3.87 - 5.11 MIL/uL   Hemoglobin 9.4 (L) 12.0 - 15.0 g/dL   HCT 29.5 (L) 36.0 - 46.0 %   MCV 89.9 80.0 - 100.0 fL   MCH 28.7 26.0 - 34.0 pg   MCHC 31.9 30.0 - 36.0 g/dL   RDW 15.6 (H) 11.5 - 15.5 %   Platelet  Count 250 150 - 400 K/uL   nRBC 0.0 0.0 - 0.2 %   Neutrophils Relative % 81 %   Neutro Abs 8.3 (H) 1.7 - 7.7 K/uL   Lymphocytes Relative 9 %   Lymphs Abs 0.9 0.7 - 4.0 K/uL   Monocytes Relative 7  %   Monocytes Absolute 0.7 0.1 - 1.0 K/uL   Eosinophils Relative 2 %   Eosinophils Absolute 0.3 0.0 - 0.5 K/uL   Basophils Relative 0 %   Basophils Absolute 0.0 0.0 - 0.1 K/uL   Immature Granulocytes 1 %   Abs Immature Granulocytes 0.05 0.00 - 0.07 K/uL   Complete Blood Count (Most recent): Lab Results  Component Value Date   WBC 10.2 01/30/2022   HGB 9.4 (L) 01/30/2022   HCT 29.5 (L) 01/30/2022   MCV 89.9 01/30/2022   PLT 250 01/30/2022   Chemistry (most recent): Diabetic Labs (most recent): Lab Results  Component Value Date   HGBA1C 5.1 12/17/2021   HGBA1C 6.2 11/05/2021   HGBA1C 7.0 07/31/2021   MICROALBUR 150 04/25/2020   MICROALBUR 23.4 09/12/2019   MICROALBUR 0.2 07/13/2017   Lipid Panel     Component Value Date/Time   CHOL 112 03/02/2022 0831   TRIG 190 (H) 03/02/2022 0831   HDL 39 (L) 03/02/2022 0831   CHOLHDL 2.9 03/02/2022 0831   CHOLHDL 5.5 (H) 09/12/2019 0831   VLDL 59 (H) 06/02/2018 0029   LDLCALC 42 03/02/2022 0831   LDLCALC 124 (H) 09/12/2019 0831    Assessment & Plan:   1. Type 2 diabetes mellitus with stage 3-4 chronic kidney disease, with long-term current use of insulin   -Her diabetes is  complicated by CKD, congestive heart failure,  obesity/sedentary life, and patient remains at extremely high risk for more acute and chronic complications of diabetes which include CAD, CVA, CKD, retinopathy, and neuropathy. These are all discussed in detail with the patient.  She is accompanied by her aide from nursing home.   Her glycemic profile was reviewed found to be near target.  Her point-of-care A1c is 6%.  Her logs did not show any hypoglycemia.  She has mild anemia.   - I have re-counseled the patient on diet management and weight loss  by adopting a carbohydrate restricted / protein rich  Diet.  - she acknowledges that there is a room for improvement in her food and drink choices. - Suggestion is made for her to avoid simple carbohydrates  from  her diet including Cakes, Sweet Desserts, Ice Cream, Soda (diet and regular), Sweet Tea, Candies, Chips, Cookies, Store Bought Juices, Alcohol in Excess of  1-2 drinks a day, Artificial Sweeteners,  Coffee Creamer, and "Sugar-free" Products, Lemonade. This will help patient to have more stable blood glucose profile and potentially avoid unintended weight gain.   - Patient is advised to stick to a routine mealtimes to eat 3 meals  a day and avoid unnecessary snacks ( to snack only to correct hypoglycemia).  - I have approached patient with the following individualized plan to manage diabetes and patient agrees.  -Her placement in group home is the best development for her lately.  -Her presentation with above target glycemia but point-of-care A1c of 6% is a reflection of her anemia with hemoglobin of 9.4.   -It is better for her to stay on Humulin RU 500 insulin for the sake of simplicity.  -She is advised to continue Humulin  U500 40 units at breakfast, 30 units at lunch, 30 units  with supper when glucose is above 90 mg/dl associated with monitoring of blood glucose before meals and at bedtime. - She is advised to skip insulin if pre-meal blood glucose is below 90 mg/dL or if she is not eating. -She is not a candidate for metformin.   - Patient specific target  for A1c; LDL, HDL, Triglycerides, were discussed in detail.  2) BP/HTN:  -Her blood pressure is controlled to target.  She has urine microalbuminuria .She is advised on  salt restriction  and I advised her to continue current medications including lisinopril 20 mg p.o. daily, hydralazine 25 mg p.o. 3 times daily, furosemide 40 mg p.o. Daily.  3) Lipids/HPL: Her recent lipid panel showed significantly improved LDL to 42.  She is advised to continue Crestor 20 mg p.o. nightly.     Side effects and precautions discussed with her.    She is also on fenofibrate.     4)  Weight/Diet: Her BMI is [id number]- clearly complicating her diabetes care.    She is a candidate for modest weight loss.  CDE consult in progress, exercise, and carbohydrates information provided.  5) Chronic Care/Health Maintenance:  -Patient is  on ACEI and encouraged to continue to follow up with Ophthalmology, Podiatrist at least yearly or according to recommendations, and advised to stay away from smoking. I have recommended yearly flu vaccine and pneumonia vaccination at least every 5 years;  and  sleep for at least 7 hours a day.    -She is advised to maintain close follow-up with her nephrologist.  - I advised patient to maintain close follow up with Legrand Rams, Normajean Baxter, MD for primary care needs.   I spent  26  minutes in the care of the patient today including review of labs from [facility], Lipids, Thyroid Function, Hematology (current and previous including abstractions from other facilities); face-to-face time discussing  her blood glucose readings/logs, discussing hypoglycemia and hyperglycemia episodes and symptoms, medications doses, her options of short and long term treatment based on the latest standards of care / guidelines;  discussion about incorporating lifestyle medicine;  and documenting the encounter. Risk reduction counseling performed per USPSTF guidelines to reduce  obesity and cardiovascular risk factors.     Please refer to Patient Instructions for Blood Glucose Monitoring and Insulin/Medications Dosing Guide"  in media tab for additional information. Please  also refer to " Patient Self Inventory" in the Media  tab for reviewed elements of pertinent patient history.  [name] participated in the discussions, expressed understanding, and voiced agreement with the above plans.  All questions were answered to her satisfaction. she is encouraged to contact clinic should she have any questions or concerns prior to her return visit.    Follow up plan: -Return in about 4 months (around 10/08/2022) for F/U with Pre-visit Labs, Meter/CGM/Logs,  A1c here.  Glade Lloyd, MD Phone: [phone]  Fax: [phone]  This note was partially dictated with voice recognition software. Similar sounding words can be transcribed inadequately or may not  be corrected upon review.  06/08/2022, 3:58 PM

## 2022-06-10 LAB — POCT GLYCOSYLATED HEMOGLOBIN (HGB A1C)

## 2022-06-23 ENCOUNTER — Other Ambulatory Visit: Payer: Self-pay | Admitting: "Endocrinology

## 2022-06-29 DIAGNOSIS — I5032 Chronic diastolic (congestive) heart failure: Secondary | ICD-10-CM | POA: Diagnosis not present

## 2022-06-29 DIAGNOSIS — N184 Chronic kidney disease, stage 4 (severe): Secondary | ICD-10-CM | POA: Diagnosis not present

## 2022-07-06 DIAGNOSIS — D631 Anemia in chronic kidney disease: Secondary | ICD-10-CM | POA: Diagnosis not present

## 2022-07-06 DIAGNOSIS — E876 Hypokalemia: Secondary | ICD-10-CM | POA: Diagnosis not present

## 2022-07-06 DIAGNOSIS — I5032 Chronic diastolic (congestive) heart failure: Secondary | ICD-10-CM | POA: Diagnosis not present

## 2022-07-06 DIAGNOSIS — E211 Secondary hyperparathyroidism, not elsewhere classified: Secondary | ICD-10-CM | POA: Diagnosis not present

## 2022-07-06 DIAGNOSIS — N189 Chronic kidney disease, unspecified: Secondary | ICD-10-CM | POA: Diagnosis not present

## 2022-07-06 DIAGNOSIS — E1129 Type 2 diabetes mellitus with other diabetic kidney complication: Secondary | ICD-10-CM | POA: Diagnosis not present

## 2022-07-06 DIAGNOSIS — E1122 Type 2 diabetes mellitus with diabetic chronic kidney disease: Secondary | ICD-10-CM | POA: Diagnosis not present

## 2022-07-06 DIAGNOSIS — D381 Neoplasm of uncertain behavior of trachea, bronchus and lung: Secondary | ICD-10-CM | POA: Diagnosis not present

## 2022-07-06 DIAGNOSIS — R809 Proteinuria, unspecified: Secondary | ICD-10-CM | POA: Diagnosis not present

## 2022-07-14 ENCOUNTER — Other Ambulatory Visit: Payer: Self-pay | Admitting: "Endocrinology

## 2022-07-14 DIAGNOSIS — E1122 Type 2 diabetes mellitus with diabetic chronic kidney disease: Secondary | ICD-10-CM

## 2022-07-30 ENCOUNTER — Other Ambulatory Visit: Payer: Self-pay

## 2022-07-30 DIAGNOSIS — D3A090 Benign carcinoid tumor of the bronchus and lung: Secondary | ICD-10-CM

## 2022-07-31 ENCOUNTER — Other Ambulatory Visit: Payer: Self-pay

## 2022-07-31 ENCOUNTER — Inpatient Hospital Stay (HOSPITAL_BASED_OUTPATIENT_CLINIC_OR_DEPARTMENT_OTHER): Payer: Medicare Other | Admitting: Hematology

## 2022-07-31 ENCOUNTER — Inpatient Hospital Stay: Payer: Medicare Other | Attending: Nurse Practitioner

## 2022-07-31 VITALS — BP 128/68 | HR 73 | Temp 97.9°F | Resp 18 | Ht 62.0 in | Wt 252.7 lb

## 2022-07-31 DIAGNOSIS — N183 Chronic kidney disease, stage 3 unspecified: Secondary | ICD-10-CM | POA: Diagnosis not present

## 2022-07-31 DIAGNOSIS — E1122 Type 2 diabetes mellitus with diabetic chronic kidney disease: Secondary | ICD-10-CM | POA: Diagnosis not present

## 2022-07-31 DIAGNOSIS — D3A090 Benign carcinoid tumor of the bronchus and lung: Secondary | ICD-10-CM | POA: Diagnosis not present

## 2022-07-31 DIAGNOSIS — C7A090 Malignant carcinoid tumor of the bronchus and lung: Secondary | ICD-10-CM | POA: Diagnosis not present

## 2022-07-31 DIAGNOSIS — I129 Hypertensive chronic kidney disease with stage 1 through stage 4 chronic kidney disease, or unspecified chronic kidney disease: Secondary | ICD-10-CM | POA: Insufficient documentation

## 2022-07-31 DIAGNOSIS — D649 Anemia, unspecified: Secondary | ICD-10-CM | POA: Diagnosis not present

## 2022-07-31 LAB — CMP (CANCER CENTER ONLY)
ALT: 8 U/L (ref 0–44)
AST: 14 U/L — ABNORMAL LOW (ref 15–41)
Albumin: 3.9 g/dL (ref 3.5–5.0)
Alkaline Phosphatase: 78 U/L (ref 38–126)
Anion gap: 7 (ref 5–15)
BUN: 44 mg/dL — ABNORMAL HIGH (ref 8–23)
CO2: 29 mmol/L (ref 22–32)
Calcium: 9.1 mg/dL (ref 8.9–10.3)
Chloride: 100 mmol/L (ref 98–111)
Creatinine: 1.81 mg/dL — ABNORMAL HIGH (ref 0.44–1.00)
GFR, Estimated: 30 mL/min — ABNORMAL LOW (ref >60)
Glucose, Bld: 245 mg/dL — ABNORMAL HIGH (ref 70–99)
Potassium: 3.6 mmol/L (ref 3.5–5.1)
Sodium: 136 mmol/L (ref 135–145)
Total Bilirubin: 0.4 mg/dL (ref 0.3–1.2)
Total Protein: 7.1 g/dL (ref 6.5–8.1)

## 2022-07-31 LAB — CBC WITH DIFFERENTIAL (CANCER CENTER ONLY)
Abs Immature Granulocytes: 0.03 10*3/uL (ref 0.00–0.07)
Basophils Absolute: 0 10*3/uL (ref 0.0–0.1)
Basophils Relative: 0 %
Eosinophils Absolute: 0.2 10*3/uL (ref 0.0–0.5)
Eosinophils Relative: 2 %
HCT: 30.2 % — ABNORMAL LOW (ref 36.0–46.0)
Hemoglobin: 9.7 g/dL — ABNORMAL LOW (ref 12.0–15.0)
Immature Granulocytes: 0 %
Lymphocytes Relative: 10 %
Lymphs Abs: 0.8 10*3/uL (ref 0.7–4.0)
MCH: 29.7 pg (ref 26.0–34.0)
MCHC: 32.1 g/dL (ref 30.0–36.0)
MCV: 92.4 fL (ref 80.0–100.0)
Monocytes Absolute: 0.6 10*3/uL (ref 0.1–1.0)
Monocytes Relative: 7 %
Neutro Abs: 6.8 10*3/uL (ref 1.7–7.7)
Neutrophils Relative %: 81 %
Platelet Count: 235 10*3/uL (ref 150–400)
RBC: 3.27 MIL/uL — ABNORMAL LOW (ref 3.87–5.11)
RDW: 14.6 % (ref 11.5–15.5)
WBC Count: 8.4 10*3/uL (ref 4.0–10.5)
nRBC: 0 % (ref 0.0–0.2)

## 2022-07-31 NOTE — Progress Notes (Signed)
HEMATOLOGY/ONCOLOGY CLINIC NOTE  Date of Service: 07/31/22   Patient Care Team: Benetta Spar, MD as PCP - General (Internal Medicine) Jonelle Sidle, MD as PCP - Cardiology (Cardiology) [place] Bali, MD (Inactive) (Gastroenterology) Jena Gauss Gerrit Friends, MD as Consulting Physician (Gastroenterology)  [facility] [phone] Legal Guardian DHSS  CHIEF COMPLAINTS/PURPOSE OF CONSULTATION:  Follow-up for bronchopulmonary carcinoid tumor  HISTORY OF PRESENTING ILLNESS:  [name] is a 67 y.o. F with mental disability in LTC, asthma well-controlled, CKD III Baseline Cr 1.3-1.6, HTN, and DM who presented with severe dyspnea requiring BiPAP by EMS.  On arrival in the ER was in V. tach, cardiology consulted and given amiodarone bolus, then developed unstable wide-complex tachycardia and acute respiratory failure.  Creatinine 4.3, potassium 7.5 on admission, pH 7.15, PCO2 45, lactic acid 3.2, chest x-ray pneumonia.  Temporizing potassium measures given, broad-spectrum antibiotics started, intubated and transferred to ICU at [facility]. Extubated 2/28 after 6 days.   The patient was recently admitted to the hospital earlier in February 2020 for community-acquired pneumonia.  During her hospitalization, the patient had a CT of the chest performed on 05/13/2018 which showed a masslike consolidation measuring 5 x 8.2 cm over the left lower lobe.  It was recommended that she have a follow-up CT scan performed within the next month as an outpatient.  During this current hospitalization, it was recommended that she undergo TEE and bronchoscopy.  This was performed on 05/26/2018.  Biopsy results showed typical carcinoid tumor and atypical squamous metaplasia, favor reactive.     When seen today, the patient had just finished dialysis.  She reports having fatigue and a cough.  She states that she has occasional blood-tinged sputum when she coughs.  She denies fevers and chills.  Denies  chest pain and shortness of breath.  Denies abdominal discomfort, nausea, vomiting, constipation, diarrhea.  She reports intermittent headaches. Oncology was consulted for recommendations regarding her carcinoid tumor.   Of note, the patient reside in an assisted living facility in [place], [place].  She tells me that she has a son who visits her regularly but he is not her power of attorney.  She has a guardian Lennox Grumbles) who oversees her care.  Telephone numbers are on the chart.  INTERVAL HISTORY:   [name] is a 67 y.o. female here for continued evaluation and management of her bronchopulmonary carcinoid tumor. Patient was last seen by me on 01/30/2022 and was doing well overall with no new medical concerns.   Today, she is accompanied by a Child psychotherapist. She does complain of a stable productive cough which she attributes to seasonal allergies. She does bring up thick-white colored phlegm.   She reports that her breathing has been stable and denies any chest pain, abdominal pain, or unexpected weight loss. She denies any recent falls or any recent infections.   Patient has normal p.o. intake continues to take oral iron. She has not needed any IV iron recently. Patient has not recently received any Aranespt injections. She continues to be on Eliquis with no toxicities.  MEDICAL HISTORY:  Past Medical History:  Diagnosis Date   Arthritis    Asthma    Atrial fibrillation (HCC)    Carcinoid tumor determined by biopsy of lung 05/2018   Left lung   Chronic diastolic CHF (congestive heart failure) (HCC)    CKD (chronic kidney disease), stage III (HCC)    Diabetes mellitus type 2 in obese (HCC)  Essential hypertension    Iron deficiency anemia 10/16/2010   Mental handicap 10/16/2010   Mild CAD 2013   Morbid obesity (HCC)    OSA (obstructive sleep apnea)     SURGICAL HISTORY: Past Surgical History:  Procedure Laterality Date   ABDOMINAL HYSTERECTOMY     AV FISTULA  PLACEMENT Right 06/06/2018   Procedure: ARTERIOVENOUS (AV) FISTULA CREATION;  Surgeon: Cephus Shelling, MD;  Location: [facility] OR;  Service: Vascular;  Laterality: Right;   BIOPSY  10/16/2021   Procedure: BIOPSY;  Surgeon: Corbin Ade, MD;  Location: [facility];  Service: Endoscopy;;   BRONCHIAL DILITATION  12/13/2020   Procedure: BRONCHIAL DILITATION;  Surgeon: Josephine Igo, DO;  Location: [facility] ENDOSCOPY;  Service: Pulmonary;;   CESAREAN SECTION     CHOLECYSTECTOMY     COLONOSCOPY  08/2010   normal TI, sigmoid polyp (adenoma ). Next TCS due  08/2015,   COLONOSCOPY WITH PROPOFOL N/A 07/08/2020   Procedure: COLONOSCOPY WITH PROPOFOL;  Surgeon: Corbin Ade, MD;  Location: [facility];  Service: Endoscopy;  Laterality: N/A;  AM (diabetic and facility patient)   ESOPHAGOGASTRODUODENOSCOPY  08/2010   antral and duodenal erosions s/p bx (chronic gastritis, no h.pylori, no celiac dz ), hiatal hernia   ESOPHAGOGASTRODUODENOSCOPY (EGD) WITH PROPOFOL N/A 10/16/2021   Procedure: ESOPHAGOGASTRODUODENOSCOPY (EGD) WITH PROPOFOL;  Surgeon: Corbin Ade, MD;  [facility];  Service: Endoscopy;  Laterality: N/A;  11:45am   ESOPHAGOGASTRODUODENOSCOPY (EGD) WITH PROPOFOL N/A 12/18/2021   Procedure: ESOPHAGOGASTRODUODENOSCOPY (EGD) WITH PROPOFOL;  Surgeon: Lanelle Bal, DO;  [facility];  Service: Endoscopy;  Laterality: N/A;   FISTULA SUPERFICIALIZATION Right 08/25/2018   Procedure: FISTULA SUPERFICIALIZATION RIGHT ARM;  Surgeon: Maeola Harman, MD;  Location: [facility] OR;  Service: Cardiovascular;  Laterality: Right;   HOT HEMOSTASIS  12/18/2021   Procedure: HOT HEMOSTASIS (ARGON PLASMA COAGULATION/BICAP);  Surgeon: Lanelle Bal, DO;  [facility];  Service: Endoscopy;;   IR FLUORO GUIDE CV LINE RIGHT  05/31/2018   IR US GUIDE VASC ACCESS RIGHT  05/31/2018   KNEE SURGERY     right knee @ 67 years of age   LEFT AND RIGHT HEART CATHETERIZATION WITH CORONARY  ANGIOGRAM N/A 09/21/2011   Procedure: LEFT AND RIGHT HEART CATHETERIZATION WITH CORONARY ANGIOGRAM;  Surgeon: Ricki Rodriguez, MD;  Location: [facility] CATH LAB;  Service: Cardiovascular;  Laterality: N/A;   POLYPECTOMY  07/08/2020   Procedure: POLYPECTOMY INTESTINAL;  Surgeon: Corbin Ade, MD;  Location: [facility];  Service: Endoscopy;;   VIDEO BRONCHOSCOPY Left 12/13/2020   Procedure: VIDEO BRONCHOSCOPY WITHOUT FLUORO;  Surgeon: Josephine Igo, DO;  Location: [facility] ENDOSCOPY;  Service: Pulmonary;  Laterality: Left;  Cryotherapy    SOCIAL HISTORY: Social History   Socioeconomic History   Marital status: Divorced    Spouse name: Not on file   Number of children: 1   Years of education: Not on file   Highest education level: Not on file  Occupational History    Employer: UNEMPLOYED  Tobacco Use   Smoking status: Former    Packs/day: 1.00    Years: 0.00    Additional pack years: 0.00    Total pack years: 0.00    Types: Cigarettes    Quit date: 04/11/1996    Years since quitting: 26.3   Smokeless tobacco: Never   Tobacco comments:    quit a couple year ago  Vaping Use   Vaping Use: Never used  Substance and Sexual Activity   Alcohol use: No   Drug use: No   Sexual activity: Never  Other Topics Concern   Not on file  Social History Narrative   Lives with parents.    Social Determinants of Health   Financial Resource Strain: Not on file  Food Insecurity: No Food Insecurity (12/17/2021)   Hunger Vital Sign    Worried About Running Out of Food in the Last Year: Never true    Ran Out of Food in the Last Year: Never true  Transportation Needs: No Transportation Needs (12/17/2021)   PRAPARE - Administrator, Civil Service (Medical): No    Lack of Transportation (Non-Medical): No  Physical Activity: Not on file  Stress: Not on file  Social Connections: Not on file  Intimate Partner Violence: Not At Risk (12/17/2021)   Humiliation, Afraid, Rape, and Kick questionnaire     Fear of Current or Ex-Partner: No    Emotionally Abused: No    Physically Abused: No    Sexually Abused: No    FAMILY HISTORY: Family History  Problem Relation Age of Onset   Diabetes Mother    Hypertension Mother    Heart failure Mother    Hypertension Father    Atrial fibrillation Father    Hypertension Son    Kidney disease Other        son reportedly has had cyst on kidney and lung s/p surgery at [facility]   Colon cancer Neg Hx     ALLERGIES:  has No Known Allergies.  MEDICATIONS:  Current Outpatient Medications  Medication Sig Dispense Refill   acetaminophen (TYLENOL) 325 MG tablet Take 325 mg by mouth every 12 (twelve) hours as needed for moderate pain.     albuterol (PROVENTIL HFA;VENTOLIN HFA) 108 (90 Base) MCG/ACT inhaler Inhale 2 puffs into the lungs every 4 (four) hours as needed for shortness of breath.     apixaban (ELIQUIS) 5 MG TABS tablet Take 1 tablet (5 mg total) by mouth 2 (two) times daily. 60 tablet 3   azithromycin (ZITHROMAX) 250 MG tablet Take 250 mg by mouth as directed.     betamethasone dipropionate (DIPROLENE) 0.05 % ointment Apply 1 application  topically 2 (two) times daily as needed (for rash under breasts).     calcitRIOL (ROCALTROL) 0.25 MCG capsule Take 0.25 mcg by mouth every Monday, Wednesday, and Friday.     cetirizine (ZYRTEC) 10 MG tablet Take 10 mg by mouth daily.     Cholecalciferol (VITAMIN D3) 50 MCG (2000 UT) TABS TAKE 1 TABLET BY MOUTH ONCE DAILY. 30 tablet 2   clotrimazole (LOTRIMIN) 1 % cream Apply 1 application  topically 2 (two) times daily as needed (for rash under breasts).     diltiazem (CARDIZEM CD) 240 MG 24 hr capsule Take 1 capsule (240 mg total) by mouth daily. 30 capsule 0   DROPSAFE SAFETY PEN NEEDLES 31G X 6 MM MISC      EASYMAX TEST test strip CHECK BLOOD SUGAR 4 TIMES A DAY. (BREAKFAST, LUNCH, SUPPER & BEDTIME) 100 each 0   ferrous sulfate 325 (65 FE) MG tablet Take 1 tablet (325 mg total) by mouth daily with  breakfast. 90 tablet 3   Finerenone (KERENDIA) 10 MG TABS Take 10 mg by mouth daily.     fluticasone (FLONASE) 50 MCG/ACT nasal spray Place 2 sprays into both nostrils daily as needed for allergies.     Fluticasone-Umeclidin-Vilant (TRELEGY ELLIPTA) 100-62.5-25 MCG/ACT AEPB Inhale 1 puff  into the lungs daily.     furosemide (LASIX) 40 MG tablet Take 20 mg by mouth See admin instructions. Take 20 mg twice daily, alternating with 20 mg daily every other day     GERI-TUSSIN DM 10-100 MG/5ML liquid Take by mouth.     HUMULIN R U-500 KWIKPEN 500 UNIT/ML KwikPen INJECT 40 UNITS SUBCUTANEOUSLY AT BREAKFAST; INJECT 30 UNITS AT LUNCH & SUPPER; WHEN GLUCOSE IS ABOVE 90 & EATING.(HOLD IF BS BELOW 70: CALL MD IF BS ABOVE 400 6 mL 0   hydrOXYzine (ATARAX) 25 MG tablet Take 25 mg by mouth 3 (three) times daily.     ipratropium-albuterol (DUONEB) 0.5-2.5 (3) MG/3ML SOLN Take 3 mLs by nebulization every 6 (six) hours as needed (for cough).     isosorbide dinitrate (ISORDIL) 10 MG tablet Take 1 tablet (10 mg total) by mouth 3 (three) times daily. 90 tablet 0   Lancets 28G MISC Use to test BG qid. E11.65 100 each 5   loperamide (IMODIUM) 2 MG capsule Take 2-4 mg by mouth See admin instructions. Take 4 mg by mouth now: then 2 mg after each loose stool as needed, do not exceed 5 doses in 24 hours.     losartan (COZAAR) 25 MG tablet Take 12.5 mg by mouth daily.     metolazone (ZAROXOLYN) 5 MG tablet Take 5 mg by mouth in the morning.     metoprolol tartrate (LOPRESSOR) 50 MG tablet Take 50 mg by mouth 2 (two) times daily.     montelukast (SINGULAIR) 10 MG tablet Take 10 mg by mouth at bedtime.     pantoprazole (PROTONIX) 40 MG tablet Take one po 30 minutes prior to breakfast daily. 90 tablet 3   polyethylene glycol powder (GLYCOLAX/MIRALAX) 17 GM/SCOOP powder Take 17 g by mouth daily as needed for mild constipation or moderate constipation.     potassium chloride SA (KLOR-CON M) 20 MEQ tablet Take 20 mEq by mouth  daily.     rosuvastatin (CRESTOR) 20 MG tablet TAKE (1) TABLET BY MOUTH ONCE DAILY. (Patient taking differently: Take 20 mg by mouth daily.) 30 tablet 3   triamcinolone cream (KENALOG) 0.5 % Apply 1 application topically every 12 (twelve) hours as needed (applied to bilateral legs for allergic dermatitis).     No current facility-administered medications for this visit.    REVIEW OF SYSTEMS:    10 Point review of Systems was done is negative except as noted above.   PHYSICAL EXAMINATION: ECOG PERFORMANCE STATUS: 3 - Symptomatic, >50% confined to bed  . Vitals:   07/31/22 1036  BP: 128/68  Pulse: 73  Resp: 18  Temp: 97.9 F (36.6 C)  SpO2: 100%    Filed Weights   07/31/22 1036  Weight: 252 lb 11.2 oz (114.6 kg)    .Body mass index is 46.22 kg/m.  GENERAL:alert, in no acute distress and comfortable SKIN: no acute rashes, no significant lesions EYES: conjunctiva are pink and non-injected, sclera anicteric OROPHARYNX: MMM, no exudates, no oropharyngeal erythema or ulceration NECK: supple, no JVD LYMPH:  no palpable lymphadenopathy in the cervical, axillary or inguinal regions LUNGS: clear to auscultation b/l with normal respiratory effort HEART: regular rate & rhythm ABDOMEN:  normoactive bowel sounds , non tender, not distended. Extremity: no pedal edema PSYCH: alert & oriented x 3 with fluent speech NEURO: no focal motor/sensory deficits   LABORATORY DATA:  I have reviewed the data as listed  .    Latest Ref Rng & Units 07/31/2022  9:40 AM 01/30/2022   10:22 AM 12/19/2021    2:59 AM  CBC  WBC 4.0 - 10.5 K/uL 8.4  10.2  9.2   Hemoglobin 12.0 - 15.0 g/dL 9.7  9.4  9.4   Hematocrit 36.0 - 46.0 % 30.2  29.5  29.4   Platelets 150 - 400 K/uL 235  250  267     .    Latest Ref Rng & Units 07/31/2022    9:40 AM 03/02/2022    8:31 AM 01/30/2022   10:22 AM  CMP  Glucose 70 - 99 mg/dL 161  096  045   BUN 8 - 23 mg/dL 44  42  42   Creatinine 0.44 - 1.00 mg/dL 4.09   8.11  9.14   Sodium 135 - 145 mmol/L 136  137  136   Potassium 3.5 - 5.1 mmol/L 3.6  4.1  3.4   Chloride 98 - 111 mmol/L 100  95  99   CO2 22 - 32 mmol/L 29  21  29    Calcium 8.9 - 10.3 mg/dL 9.1  9.5  9.4   Total Protein 6.5 - 8.1 g/dL 7.1  6.8  7.6   Total Bilirubin 0.3 - 1.2 mg/dL 0.4  0.4  0.3   Alkaline Phos 38 - 126 U/L 78  82  74   AST 15 - 41 U/L 14  15  12    ALT 0 - 44 U/L 8  8  7       05/26/18 Biopsy:    RADIOGRAPHIC STUDIES: I have personally reviewed the radiological images as listed and agreed with the findings in the report. No results found.   ASSESSMENT & PLAN:  67 y.o. female with  1.  BronchoPulmonary carcinoid tumor - lung primary.  Causing some left lower lobe airway obstruction with post obstructive atelectasis of anterior left lower lobe.   2.  Anemia: Likely due to her renal disease. No transfusion is indicated.   She is currently on Aranesp.   3.  Kidney disease:  Management per nephrology.  PLAN:  -Discussed lab results on 07/31/2022 in detail with patient. CBC showed WBC of 8.4K, hemoglobin of 9.7, and platelets of 235K. -mild anemia stable Chromogranin levels increased to 807.5. No symptoms of carcinoid syndrome. -discussed last CT chest scan October 2023 which revealed stable left lower lobe pulmonary lesion from her bronchopulmonary carcinoid. -CMP reveals stable CKD and glucose elevated at 245 -Patient has no clinical, lab or radiographic evidence of disease progression at this time. We shall continue active surveillance at this time.  No indication at this time to pursue significant surgery or radiation. -recommend Aranesp injections for anemia to boost low RBC caused by CKD -continue Eliquis  -continue to follow up with nephrologist to manage CKD-related anemia -Patient has a scheduled appointment with nephrology on 08/03/2022  -repeat CT chest scan in 6 months  FOLLOW UP: CT chest wo contrast in 22 weeks RTC with Dr Candise Che with labs in 24  weeks  The total time spent in the appointment was 23 minutes* .  All of the patient's questions were answered with apparent satisfaction. The patient knows to call the clinic with any problems, questions or concerns.   Wyvonnia Lora MD MS AAHIVMS [facility] Hematology/Oncology Physician [facility]  .*Total Encounter Time as defined by the Centers for Medicare and Medicaid Services includes, in addition to the face-to-face time of a patient visit (documented in the note above) non-face-to-face time: obtaining and reviewing  outside history, ordering and reviewing medications, tests or procedures, care coordination (communications with other health care professionals or caregivers) and documentation in the medical record.    I,Mitra Faeizi,acting as a Neurosurgeon for Wyvonnia Lora, MD.,have documented all relevant documentation on the behalf of Wyvonnia Lora, MD,as directed by  Wyvonnia Lora, MD while in the presence of Wyvonnia Lora, MD.  .I have reviewed the above documentation for accuracy and completeness, and I agree with the above. Johney Maine MD

## 2022-08-03 DIAGNOSIS — R809 Proteinuria, unspecified: Secondary | ICD-10-CM | POA: Diagnosis not present

## 2022-08-03 DIAGNOSIS — E1122 Type 2 diabetes mellitus with diabetic chronic kidney disease: Secondary | ICD-10-CM | POA: Diagnosis not present

## 2022-08-03 DIAGNOSIS — E876 Hypokalemia: Secondary | ICD-10-CM | POA: Diagnosis not present

## 2022-08-03 DIAGNOSIS — Z6841 Body Mass Index (BMI) 40.0 and over, adult: Secondary | ICD-10-CM | POA: Diagnosis not present

## 2022-08-03 DIAGNOSIS — N2581 Secondary hyperparathyroidism of renal origin: Secondary | ICD-10-CM | POA: Diagnosis not present

## 2022-08-03 DIAGNOSIS — I5032 Chronic diastolic (congestive) heart failure: Secondary | ICD-10-CM | POA: Diagnosis not present

## 2022-08-03 DIAGNOSIS — N1832 Chronic kidney disease, stage 3b: Secondary | ICD-10-CM | POA: Diagnosis not present

## 2022-08-04 LAB — CHROMOGRANIN A: Chromogranin A (ng/mL): 807.5 ng/mL — ABNORMAL HIGH (ref 0.0–101.8)

## 2022-08-17 ENCOUNTER — Other Ambulatory Visit: Payer: Self-pay | Admitting: "Endocrinology

## 2022-08-17 DIAGNOSIS — I480 Paroxysmal atrial fibrillation: Secondary | ICD-10-CM | POA: Diagnosis not present

## 2022-08-17 DIAGNOSIS — J449 Chronic obstructive pulmonary disease, unspecified: Secondary | ICD-10-CM | POA: Diagnosis not present

## 2022-08-17 DIAGNOSIS — I110 Hypertensive heart disease with heart failure: Secondary | ICD-10-CM | POA: Diagnosis not present

## 2022-08-17 DIAGNOSIS — I5032 Chronic diastolic (congestive) heart failure: Secondary | ICD-10-CM | POA: Diagnosis not present

## 2022-09-03 ENCOUNTER — Other Ambulatory Visit: Payer: Self-pay | Admitting: "Endocrinology

## 2022-09-07 NOTE — Progress Notes (Unsigned)
    Cardiology Office Note  Date: 09/08/2022   ID: SUEKO DIMICHELE, DOB Jan 16, 1956, MRN [id number]  History of Present Illness: [name] is a 67 y.o. female last seen in November 2023.  She continues to reside at [facility].  She is here today with patient advocate.  She does not report any progressive sense of palpitations, has had no dizziness or syncope.  She is limited by arthritic knee pain but is ambulatory about the facility.  She does not describe any exertional chest pain.  I reviewed her medications which are stable from a cardiac perspective.  She remains on Eliquis for stroke prophylaxis, no reported spontaneous bleeding problems.  Dose is correct based on recent lab work.  She also remains on Cardizem CD 240 mg daily and Lopressor 50 mg twice daily.  States that she continues to see Dr. Felecia Shelling for primary care, also follow-up with oncology, nephrology, and endocrinology.  Physical Exam: VS:  BP 118/78   Pulse 62   Ht 5\' 3"  (1.6 m)   Wt 248 lb (112.5 kg)   SpO2 97%   BMI 43.93 kg/m , BMI Body mass index is 43.93 kg/m.  Wt Readings from Last 3 Encounters:  09/08/22 248 lb (112.5 kg)  07/31/22 252 lb 11.2 oz (114.6 kg)  06/08/22 255 lb 3.2 oz (115.8 kg)    General: Patient appears comfortable at rest. HEENT: Conjunctiva and lids normal. Neck: Supple, no elevated JVP or carotid bruits. Lungs: Clear to auscultation, nonlabored breathing at rest. Cardiac: Regular rate and rhythm, no S3, 1/6 systolic murmur. Extremities: No pitting edema.  ECG:  An ECG dated 02/24/2022 was personally reviewed today and demonstrated:  Atrial fibrillation with nonspecific T wave changes.  Labwork: 12/17/2021: B Natriuretic Peptide 461.0 03/02/2022: TSH 2.710 07/31/2022: ALT 8; AST 14; BUN 44; Creatinine 1.81; Hemoglobin 9.7; Platelet Count 235; Potassium 3.6; Sodium 136     Component Value Date/Time   CHOL 112 03/02/2022 0831   TRIG 190 (H) 03/02/2022 0831   HDL 39 (L) 03/02/2022  0831   CHOLHDL 2.9 03/02/2022 0831   CHOLHDL 5.5 (H) 09/12/2019 0831   VLDL 59 (H) 06/02/2018 0029   LDLCALC 42 03/02/2022 0831   LDLCALC 124 (H) 09/12/2019 0831   Other Studies Reviewed Today:  No interval cardiac testing for review today.  Assessment and Plan:  1.  Paroxysmal to persistent atrial fibrillation with CHA2DS2-VASc score of 4.  She reports no progressive palpitations on current regimen including Cardizem CD and Lopressor.  Continue stroke prophylaxis with Eliquis, no spontaneous bleeding problems reported.  Heart rate is regular today in the 60s.  2.  HFpEF, LVEF 60 to 65% by echocardiogram in 2020.  No progressive fluid retention, weight is actually down about 4 pounds compared to last check.  She is on [place] with CKD stage IIIb, Lasix with potassium supplement, and Zaroxolyn.  3.  CKD stage IIIb, creatinine 1.81 in May.  Keep follow-up with nephrology.  4.  History of bronchopulmonary carcinoid tumor, continue to follow with Oncology.  I reviewed the note from May.  Disposition:  Follow up  6 months.  Signed, Jonelle Sidle, M.D., F.A.C.C. [facility]

## 2022-09-08 ENCOUNTER — Encounter: Payer: Self-pay | Admitting: Cardiology

## 2022-09-08 ENCOUNTER — Ambulatory Visit: Payer: Medicare Other | Admitting: Cardiology

## 2022-09-08 ENCOUNTER — Ambulatory Visit: Payer: Medicare Other | Attending: Cardiology | Admitting: Cardiology

## 2022-09-08 VITALS — BP 118/78 | HR 62 | Ht 63.0 in | Wt 248.0 lb

## 2022-09-08 DIAGNOSIS — N1832 Chronic kidney disease, stage 3b: Secondary | ICD-10-CM | POA: Insufficient documentation

## 2022-09-08 DIAGNOSIS — I480 Paroxysmal atrial fibrillation: Secondary | ICD-10-CM | POA: Diagnosis not present

## 2022-09-08 NOTE — Patient Instructions (Addendum)

## 2022-09-11 DIAGNOSIS — B351 Tinea unguium: Secondary | ICD-10-CM | POA: Diagnosis not present

## 2022-09-11 DIAGNOSIS — M79675 Pain in left toe(s): Secondary | ICD-10-CM | POA: Diagnosis not present

## 2022-09-11 DIAGNOSIS — M79674 Pain in right toe(s): Secondary | ICD-10-CM | POA: Diagnosis not present

## 2022-09-17 DIAGNOSIS — I5032 Chronic diastolic (congestive) heart failure: Secondary | ICD-10-CM | POA: Diagnosis not present

## 2022-09-17 DIAGNOSIS — N184 Chronic kidney disease, stage 4 (severe): Secondary | ICD-10-CM | POA: Diagnosis not present

## 2022-10-06 ENCOUNTER — Other Ambulatory Visit: Payer: Self-pay | Admitting: "Endocrinology

## 2022-10-06 DIAGNOSIS — N184 Chronic kidney disease, stage 4 (severe): Secondary | ICD-10-CM | POA: Diagnosis not present

## 2022-10-06 DIAGNOSIS — Z794 Long term (current) use of insulin: Secondary | ICD-10-CM | POA: Diagnosis not present

## 2022-10-06 DIAGNOSIS — E782 Mixed hyperlipidemia: Secondary | ICD-10-CM | POA: Diagnosis not present

## 2022-10-06 DIAGNOSIS — E1122 Type 2 diabetes mellitus with diabetic chronic kidney disease: Secondary | ICD-10-CM | POA: Diagnosis not present

## 2022-10-07 LAB — LIPID PANEL
Chol/HDL Ratio: 2.5 ratio (ref 0.0–4.4)
Cholesterol, Total: 107 mg/dL (ref 100–199)
HDL: 42 mg/dL (ref >39)
LDL Chol Calc (NIH): 42 mg/dL (ref 0–99)
Triglycerides: 133 mg/dL (ref 0–149)
VLDL Cholesterol Cal: 23 mg/dL (ref 5–40)

## 2022-10-07 LAB — COMPREHENSIVE METABOLIC PANEL
ALT: 8 IU/L (ref 0–32)
AST: 12 IU/L (ref 0–40)
Albumin: 3.9 g/dL (ref 3.9–4.9)
Alkaline Phosphatase: 84 IU/L (ref 44–121)
BUN/Creatinine Ratio: 26 (ref 12–28)
BUN: 53 mg/dL — ABNORMAL HIGH (ref 8–27)
Bilirubin Total: 0.3 mg/dL (ref 0.0–1.2)
CO2: 23 mmol/L (ref 20–29)
Calcium: 9.6 mg/dL (ref 8.7–10.3)
Chloride: 94 mmol/L — ABNORMAL LOW (ref 96–106)
Creatinine, Ser: 2.02 mg/dL — ABNORMAL HIGH (ref 0.57–1.00)
Globulin, Total: 2.7 g/dL (ref 1.5–4.5)
Glucose: 173 mg/dL — ABNORMAL HIGH (ref 70–99)
Potassium: 3.8 mmol/L (ref 3.5–5.2)
Sodium: 136 mmol/L (ref 134–144)
Total Protein: 6.6 g/dL (ref 6.0–8.5)
eGFR: 27 mL/min/{1.73_m2} — ABNORMAL LOW (ref >59)

## 2022-10-07 LAB — T4, FREE: Free T4: 1.56 ng/dL (ref 0.82–1.77)

## 2022-10-07 LAB — TSH: TSH: 2.19 u[IU]/mL (ref 0.450–4.500)

## 2022-10-13 ENCOUNTER — Encounter: Payer: Self-pay | Admitting: "Endocrinology

## 2022-10-13 ENCOUNTER — Other Ambulatory Visit: Payer: Self-pay

## 2022-10-13 ENCOUNTER — Ambulatory Visit: Payer: Medicare Other | Admitting: "Endocrinology

## 2022-10-13 VITALS — BP 110/74 | HR 64 | Ht 63.0 in | Wt 247.4 lb

## 2022-10-13 DIAGNOSIS — E1122 Type 2 diabetes mellitus with diabetic chronic kidney disease: Secondary | ICD-10-CM

## 2022-10-13 DIAGNOSIS — N184 Chronic kidney disease, stage 4 (severe): Secondary | ICD-10-CM | POA: Diagnosis not present

## 2022-10-13 DIAGNOSIS — Z794 Long term (current) use of insulin: Secondary | ICD-10-CM

## 2022-10-13 DIAGNOSIS — E782 Mixed hyperlipidemia: Secondary | ICD-10-CM

## 2022-10-13 DIAGNOSIS — I10 Essential (primary) hypertension: Secondary | ICD-10-CM | POA: Diagnosis not present

## 2022-10-13 LAB — POCT GLYCOSYLATED HEMOGLOBIN (HGB A1C): HbA1c, POC (controlled diabetic range): 5.4 % (ref 0.0–7.0)

## 2022-10-13 MED ORDER — HUMULIN R U-500 KWIKPEN 500 UNIT/ML ~~LOC~~ SOPN
40.0000 [IU] | PEN_INJECTOR | Freq: Three times a day (TID) | SUBCUTANEOUS | 2 refills | Status: DC
Start: 2022-10-13 — End: 2023-01-21

## 2022-10-13 MED ORDER — HUMULIN R U-500 KWIKPEN 500 UNIT/ML ~~LOC~~ SOPN
40.0000 [IU] | PEN_INJECTOR | Freq: Three times a day (TID) | SUBCUTANEOUS | 2 refills | Status: DC
Start: 2022-10-13 — End: 2022-10-13

## 2022-10-13 NOTE — Patient Instructions (Signed)

## 2022-10-13 NOTE — Progress Notes (Signed)
10/13/2022  Endocrinology follow-up note   Subjective:    Patient ID: [name], female    DOB: 1955/10/16, PCP Benetta Spar, MD   Past Medical History:  Diagnosis Date   Arthritis    Asthma    Atrial fibrillation (HCC)    Carcinoid tumor determined by biopsy of lung 05/2018   Left lung   Chronic diastolic CHF (congestive heart failure) (HCC)    CKD (chronic kidney disease), stage III (HCC)    Diabetes mellitus type 2 in obese    Essential hypertension    Iron deficiency anemia 10/16/2010   Mental handicap 10/16/2010   Mild CAD 2013   Morbid obesity (HCC)    OSA (obstructive sleep apnea)    Past Surgical History:  Procedure Laterality Date   ABDOMINAL HYSTERECTOMY     AV FISTULA PLACEMENT Right 06/06/2018   Procedure: ARTERIOVENOUS (AV) FISTULA CREATION;  Surgeon: Cephus Shelling, MD;  Location: [facility] OR;  Service: Vascular;  Laterality: Right;   BIOPSY  10/16/2021   Procedure: BIOPSY;  Surgeon: Corbin Ade, MD;  [facility];  Service: Endoscopy;;   BRONCHIAL DILITATION  12/13/2020   Procedure: BRONCHIAL DILITATION;  Surgeon: Josephine Igo, DO;  Location: [facility];  Service: Pulmonary;;   CESAREAN SECTION     CHOLECYSTECTOMY     COLONOSCOPY  08/2010   normal TI, sigmoid polyp (adenoma ). Next TCS due  08/2015,   COLONOSCOPY WITH PROPOFOL N/A 07/08/2020   Procedure: COLONOSCOPY WITH PROPOFOL;  Surgeon: Corbin Ade, MD;  [facility]: [facility];  Service: Endoscopy;  Laterality: N/A;  AM (diabetic and facility patient)   ESOPHAGOGASTRODUODENOSCOPY  08/2010   antral and duodenal erosions s/p bx (chronic gastritis, no h.pylori, no celiac dz ), hiatal hernia   ESOPHAGOGASTRODUODENOSCOPY (EGD) WITH PROPOFOL N/A 10/16/2021   Procedure: ESOPHAGOGASTRODUODENOSCOPY (EGD) WITH PROPOFOL;  Surgeon: Corbin Ade, MD;  [facility];  Service: Endoscopy;  Laterality: N/A;  11:45am   ESOPHAGOGASTRODUODENOSCOPY (EGD) WITH PROPOFOL N/A  12/18/2021   Procedure: ESOPHAGOGASTRODUODENOSCOPY (EGD) WITH PROPOFOL;  Surgeon: Lanelle Bal, DO;  [facility];  Service: Endoscopy;  Laterality: N/A;   FISTULA SUPERFICIALIZATION Right 08/25/2018   Procedure: FISTULA SUPERFICIALIZATION RIGHT ARM;  Surgeon: Maeola Harman, MD;  Location: [facility] OR;  Service: Cardiovascular;  Laterality: Right;   HOT HEMOSTASIS  12/18/2021   Procedure: HOT HEMOSTASIS (ARGON PLASMA COAGULATION/BICAP);  Surgeon: Lanelle Bal, DO;  [facility];  Service: Endoscopy;;   IR FLUORO GUIDE CV LINE RIGHT  05/31/2018   IR US GUIDE VASC ACCESS RIGHT  05/31/2018   KNEE SURGERY     right knee @ 67 years of age   LEFT AND RIGHT HEART CATHETERIZATION WITH CORONARY ANGIOGRAM N/A 09/21/2011   Procedure: LEFT AND RIGHT HEART CATHETERIZATION WITH CORONARY ANGIOGRAM;  Surgeon: Ricki Rodriguez, MD;  Location: [facility];  Service: Cardiovascular;  Laterality: N/A;   POLYPECTOMY  07/08/2020   Procedure: POLYPECTOMY INTESTINAL;  Surgeon: Corbin Ade, MD;  [facility];  Service: Endoscopy;;   VIDEO BRONCHOSCOPY Left 12/13/2020   Procedure: VIDEO BRONCHOSCOPY WITHOUT FLUORO;  Surgeon: Josephine Igo, DO;  Location: [facility];  Service: Pulmonary;  Laterality: Left;  Cryotherapy   Social History   Socioeconomic History   Marital status: Divorced    Spouse name: Not on file   Number of children: 1   Years of education: Not on file   Highest education level:  Not on file  Occupational History    Employer: UNEMPLOYED  Tobacco Use   Smoking status: Former    Current packs/day: 0.00    Types: Cigarettes    Start date: 04/11/1996    Quit date: 04/11/1996    Years since quitting: 26.5   Smokeless tobacco: Never   Tobacco comments:    quit a couple year ago  Vaping Use   Vaping status: Never Used  Substance and Sexual Activity   Alcohol use: No   Drug use: No   Sexual activity: Never  Other Topics Concern   Not on file   Social History Narrative   Lives with parents.    Social Determinants of Health   Financial Resource Strain: Not on file  Food Insecurity: No Food Insecurity (12/17/2021)   Hunger Vital Sign    Worried About Running Out of Food in the Last Year: Never true    Ran Out of Food in the Last Year: Never true  Transportation Needs: No Transportation Needs (12/17/2021)   PRAPARE - Administrator, Civil Service (Medical): No    Lack of Transportation (Non-Medical): No  Physical Activity: Not on file  Stress: Not on file  Social Connections: Not on file   Outpatient Encounter Medications as of 10/13/2022  Medication Sig   hydrOXYzine (ATARAX) 25 MG tablet Take 25 mg by mouth 3 (three) times daily.   acetaminophen (TYLENOL) 325 MG tablet Take 325 mg by mouth every 12 (twelve) hours as needed for moderate pain.   albuterol (PROVENTIL HFA;VENTOLIN HFA) 108 (90 Base) MCG/ACT inhaler Inhale 2 puffs into the lungs every 4 (four) hours as needed for shortness of breath.   apixaban (ELIQUIS) 5 MG TABS tablet Take 1 tablet (5 mg total) by mouth 2 (two) times daily.   betamethasone dipropionate (DIPROLENE) 0.05 % ointment Apply 1 application  topically 2 (two) times daily as needed (for rash under breasts).   calcitRIOL (ROCALTROL) 0.25 MCG capsule Take 0.25 mcg by mouth every Monday, Wednesday, and Friday.   cetirizine (ZYRTEC) 10 MG tablet Take 10 mg by mouth daily.   Cholecalciferol (VITAMIN D3) 50 MCG (2000 UT) TABS TAKE 1 TABLET BY MOUTH ONCE DAILY.   diltiazem (CARDIZEM CD) 240 MG 24 hr capsule Take 1 capsule (240 mg total) by mouth daily.   DROPSAFE SAFETY PEN NEEDLES 31G X 6 MM MISC    ferrous sulfate 325 (65 FE) MG tablet Take 1 tablet (325 mg total) by mouth daily with breakfast.   Finerenone (KERENDIA) 10 MG TABS Take 10 mg by mouth daily.   fluticasone (FLONASE) 50 MCG/ACT nasal spray Place 2 sprays into both nostrils daily as needed for allergies.   furosemide (LASIX) 40 MG  tablet Take 20 mg by mouth See admin instructions. Take 20 mg twice daily, alternating with 20 mg daily every other day   GERI-TUSSIN DM 10-100 MG/5ML liquid Take by mouth.   glucose blood (EASYMAX TEST) test strip CHECK BLOOD SUGAR 4 TIMES A DAY. (BREAKFAST, LUNCH, SUPPER & BEDTIME)   ipratropium-albuterol (DUONEB) 0.5-2.5 (3) MG/3ML SOLN Take 3 mLs by nebulization every 6 (six) hours as needed (for cough).   isosorbide dinitrate (ISORDIL) 10 MG tablet Take 1 tablet (10 mg total) by mouth 3 (three) times daily.   Lancets MISC CHECK BLOOD SUGAR 4 TIMES A DAY. (BREAKFAST, LUNCH, SUPPER, & BEDTIME)   loperamide (IMODIUM) 2 MG capsule Take 2-4 mg by mouth See admin instructions. Take 4 mg by mouth now: then  2 mg after each loose stool as needed, do not exceed 5 doses in 24 hours.   losartan (COZAAR) 25 MG tablet Take 12.5 mg by mouth daily.   metolazone (ZAROXOLYN) 5 MG tablet Take 5 mg by mouth in the morning.   metoprolol tartrate (LOPRESSOR) 50 MG tablet Take 50 mg by mouth 2 (two) times daily.   montelukast (SINGULAIR) 10 MG tablet Take 10 mg by mouth at bedtime.   pantoprazole (PROTONIX) 40 MG tablet Take one po 30 minutes prior to breakfast daily.   polyethylene glycol powder (GLYCOLAX/MIRALAX) 17 GM/SCOOP powder Take 17 g by mouth daily as needed for mild constipation or moderate constipation.   potassium chloride SA (KLOR-CON M) 20 MEQ tablet Take 20 mEq by mouth daily.   rosuvastatin (CRESTOR) 20 MG tablet TAKE (1) TABLET BY MOUTH ONCE DAILY. (Patient taking differently: Take 10 mg by mouth daily.)   triamcinolone cream (KENALOG) 0.5 % Apply 1 application topically every 12 (twelve) hours as needed (applied to bilateral legs for allergic dermatitis).   [DISCONTINUED] HUMULIN R U-500 KWIKPEN 500 UNIT/ML KwikPen INJECT 40 UNITS SUBCUTANEOUSLY AT BREAKFAST; INJECT 30 UNITS AT LUNCH & SUPPER; WHEN GLUCOSE IS ABOVE 90 & EATING.(HOLD IF BS BELOW 70: CALL MD IF BS ABOVE 400   [DISCONTINUED] insulin  regular human CONCENTRATED (HUMULIN R U-500 KWIKPEN) 500 UNIT/ML KwikPen Inject 40 Units into the skin 3 (three) times daily before meals.   No facility-administered encounter medications on file as of 10/13/2022.   ALLERGIES: No Known Allergies VACCINATION STATUS: Immunization History  Administered Date(s) Administered   Hepatitis B, ADULT 07/29/2018, 08/29/2018, 10/07/2018   Influenza Inj Mdck Quad Pf 12/19/2016   Influenza,inj,Quad PF,6+ Mos 02/02/2013, 05/15/2018   Influenza-Unspecified 05/15/2018   PPD Test 07/22/2018   Pneumococcal Polysaccharide-23 05/15/2018   Pneumococcal-Unspecified 05/15/2018    Diabetes She presents for her follow-up diabetic visit. She has type 2 diabetes mellitus. Onset time: she was diagnosed at approximate age of 89 years. Her disease course has been fluctuating. There are no hypoglycemic associated symptoms. Pertinent negatives for hypoglycemia include no confusion, headaches, pallor or seizures. Pertinent negatives for diabetes include no blurred vision, no chest pain, no fatigue, no polydipsia, no polyphagia and no polyuria. There are no hypoglycemic complications. Symptoms are improving. Diabetic complications include nephropathy. Risk factors for coronary artery disease include diabetes mellitus, dyslipidemia, hypertension, obesity and sedentary lifestyle. Current diabetic treatment includes intensive insulin program. Her weight is decreasing steadily. She is following a generally unhealthy diet. She has had a previous visit with a dietitian. She never participates in exercise. Her home blood glucose trend is fluctuating minimally. Her breakfast blood glucose range is generally 180-200 mg/dl. Her lunch blood glucose range is generally 180-200 mg/dl. Her dinner blood glucose range is generally 180-200 mg/dl. Her bedtime blood glucose range is generally 180-200 mg/dl. Her overall blood glucose range is 180-200 mg/dl. (She is accompanied by her aide from nursing  home.   Her glycemic profile was reviewed found to be significantly above target in discordance with her point-of-care A1c of 5.4%.  This may be due to her anemia.  She does not any significant hypoglycemia on her logs.   ) An ACE inhibitor/angiotensin II receptor blocker is being taken.  Hypertension This is a chronic problem. The current episode started more than 1 year ago. The problem is uncontrolled. Pertinent negatives include no blurred vision, chest pain, headaches, palpitations or shortness of breath. Risk factors for coronary artery disease include diabetes mellitus, dyslipidemia, obesity and  sedentary lifestyle. Past treatments include ACE inhibitors. Hypertensive end-organ damage includes kidney disease. Identifiable causes of hypertension include chronic renal disease.  Hyperlipidemia This is a chronic problem. The current episode started more than 1 year ago. The problem is uncontrolled. Exacerbating diseases include chronic renal disease, diabetes and obesity. Pertinent negatives include no chest pain, myalgias or shortness of breath. Current antihyperlipidemic treatment includes statins and fibric acid derivatives. Risk factors for coronary artery disease include diabetes mellitus, dyslipidemia, obesity, a sedentary lifestyle and hypertension.    Review of Systems  Constitutional:  Negative for chills, fatigue and fever.  HENT:  Negative for trouble swallowing and voice change.   Eyes:  Negative for blurred vision and visual disturbance.  Respiratory:  Negative for cough, shortness of breath and wheezing.   Cardiovascular:  Negative for chest pain, palpitations and leg swelling.  Gastrointestinal:  Negative for diarrhea, nausea and vomiting.  Endocrine: Negative for cold intolerance, heat intolerance, polydipsia, polyphagia and polyuria.  Musculoskeletal:  Positive for gait problem. Negative for arthralgias and myalgias.  Skin:  Negative for color change, pallor, rash and wound.   Neurological:  Negative for seizures and headaches.  Hematological:  Does not bruise/bleed easily.  Psychiatric/Behavioral:  Negative for confusion and suicidal ideas.     Objective:    BP 110/74   Pulse 64   Ht 5\' 3"  (1.6 m)   Wt 247 lb 6.4 oz (112.2 kg)   BMI 43.82 kg/m   Wt Readings from Last 3 Encounters:  10/13/22 247 lb 6.4 oz (112.2 kg)  09/08/22 248 lb (112.5 kg)  07/31/22 252 lb 11.2 oz (114.6 kg)      Results for orders placed or performed in visit on 10/13/22  HgB A1c  Result Value Ref Range   Hemoglobin A1C     HbA1c POC (<> result, manual entry)     HbA1c, POC (prediabetic range)     HbA1c, POC (controlled diabetic range) 5.4 0.0 - 7.0 %   Complete Blood Count (Most recent): Lab Results  Component Value Date   WBC 8.4 07/31/2022   HGB 9.7 (L) 07/31/2022   HCT 30.2 (L) 07/31/2022   MCV 92.4 07/31/2022   PLT 235 07/31/2022   Chemistry (most recent): Diabetic Labs (most recent): Lab Results  Component Value Date   HGBA1C 5.4 10/13/2022   HGBA1C 5.1 12/17/2021   HGBA1C 6.2 11/05/2021   MICROALBUR 150 04/25/2020   MICROALBUR 23.4 09/12/2019   MICROALBUR 0.2 07/13/2017   Lipid Panel     Component Value Date/Time   CHOL 107 10/06/2022 0802   TRIG 133 10/06/2022 0802   HDL 42 10/06/2022 0802   CHOLHDL 2.5 10/06/2022 0802   CHOLHDL 5.5 (H) 09/12/2019 0831   VLDL 59 (H) 06/02/2018 0029   LDLCALC 42 10/06/2022 0802   LDLCALC 124 (H) 09/12/2019 0831    Assessment & Plan:   1. Type 2 diabetes mellitus with stage 3-4 chronic kidney disease, with long-term current use of insulin   -Her diabetes is  complicated by CKD, congestive heart failure,  obesity/sedentary life, and patient remains at extremely high risk for more acute and chronic complications of diabetes which include CAD, CVA, CKD, retinopathy, and neuropathy. These are all discussed in detail with the patient.  She is accompanied by her aide from nursing home.   Her glycemic profile was  reviewed found to be significantly above target in discordance with her point-of-care A1c of 5.4%.  This may be due to her anemia.  She does  not any significant hypoglycemia on her logs.  - I have re-counseled the patient on diet management and weight loss  by adopting a carbohydrate restricted / protein rich  Diet.  - she acknowledges that there is a room for improvement in her food and drink choices. - Suggestion is made for her to avoid simple carbohydrates  from her diet including Cakes, Sweet Desserts, Ice Cream, Soda (diet and regular), Sweet Tea, Candies, Chips, Cookies, Store Bought Juices, Alcohol in Excess of  1-2 drinks a day, Artificial Sweeteners,  Coffee Creamer, and "Sugar-free" Products, Lemonade. This will help patient to have more stable blood glucose profile and potentially avoid unintended weight gain.   - Patient is advised to stick to a routine mealtimes to eat 3 meals  a day and avoid unnecessary snacks ( to snack only to correct hypoglycemia).  - I have approached patient with the following individualized plan to manage diabetes and patient agrees.  -Her placement in group home is the best development for her lately.  -Her presentation with above target glycemia but point-of-care A1c of 5.4 6% is a reflection of her anemia with hemoglobin of 9.7.   -It is better for her to stay on Humulin RU 500 insulin for the sake of simplicity.  -She is advised to continue Humulin  U500 40 units at breakfast, 40 units at lunch, 40 units with supper when glucose is above 90 mg/dl associated with monitoring of blood glucose before meals and at bedtime. - She is advised to skip insulin if pre-meal blood glucose is below 90 mg/dL or if she is not eating. -She is not a candidate for metformin.   - Patient specific target  for A1c; LDL, HDL, Triglycerides, were discussed in detail.  2) BP/HTN:  -Her blood pressure is controlled to target.  She has urine microalbuminuria .She is advised on   salt restriction  and I advised her to continue current medications including lisinopril 20 mg p.o. daily, hydralazine 25 mg p.o. 3 times daily, furosemide 40 mg p.o. Daily.  3) Lipids/HPL: Her recent lipid panel showed significantly controlled LDL at 42.   She is advised to continue Crestor 20 mg p.o. nightly.     Side effects and precautions discussed with her.    She is also on fenofibrate.     4)  Weight/Diet: Her BMI is 43.82-recently losing weight progressively.  She is still a candidate for modest weight loss.   CDE consult in progress, exercise, and carbohydrates information provided.  5) Chronic Care/Health Maintenance:  -Patient is  on ACEI and encouraged to continue to follow up with Ophthalmology, Podiatrist at least yearly or according to recommendations, and advised to stay away from smoking. I have recommended yearly flu vaccine and pneumonia vaccination at least every 5 years;  and  sleep for at least 7 hours a day.    -She is advised to maintain close follow-up with her nephrologist.  - I advised patient to maintain close follow up with Felecia Shelling, [name], MD for primary care needs.   I spent  26  minutes in the care of the patient today including review of labs from CMP, Lipids, Thyroid Function, Hematology (current and previous including abstractions from other facilities); face-to-face time discussing  her blood glucose readings/logs, discussing hypoglycemia and hyperglycemia episodes and symptoms, medications doses, her options of short and long term treatment based on the latest standards of care / guidelines;  discussion about incorporating lifestyle medicine;  and documenting the encounter. Risk  reduction counseling performed per USPSTF guidelines to reduce  obesity and cardiovascular risk factors.     Please refer to Patient Instructions for Blood Glucose Monitoring and Insulin/Medications Dosing Guide"  in media tab for additional information. Please  also refer to  " Patient Self Inventory" in the Media  tab for reviewed elements of pertinent patient history.  [name] participated in the discussions, expressed understanding, and voiced agreement with the above plans.  All questions were answered to her satisfaction. she is encouraged to contact clinic should she have any questions or concerns prior to her return visit.   Follow up plan: -Return in about 4 months (around 02/13/2023) for Bring Meter/CGM Device/Logs- A1c in Office.  Marquis Lunch, MD Phone: [phone]  Fax: [phone]  This note was partially dictated with voice recognition software. Similar sounding words can be transcribed inadequately or may not  be corrected upon review.  10/13/2022, 7:14 PM

## 2022-10-17 DIAGNOSIS — N184 Chronic kidney disease, stage 4 (severe): Secondary | ICD-10-CM | POA: Diagnosis not present

## 2022-10-17 DIAGNOSIS — I5032 Chronic diastolic (congestive) heart failure: Secondary | ICD-10-CM | POA: Diagnosis not present

## 2022-10-19 DIAGNOSIS — Z7984 Long term (current) use of oral hypoglycemic drugs: Secondary | ICD-10-CM | POA: Diagnosis not present

## 2022-10-19 DIAGNOSIS — E119 Type 2 diabetes mellitus without complications: Secondary | ICD-10-CM | POA: Diagnosis not present

## 2022-10-19 DIAGNOSIS — H25813 Combined forms of age-related cataract, bilateral: Secondary | ICD-10-CM | POA: Diagnosis not present

## 2022-10-19 DIAGNOSIS — Z794 Long term (current) use of insulin: Secondary | ICD-10-CM | POA: Diagnosis not present

## 2022-11-03 ENCOUNTER — Other Ambulatory Visit: Payer: Self-pay | Admitting: "Endocrinology

## 2022-11-17 DIAGNOSIS — N184 Chronic kidney disease, stage 4 (severe): Secondary | ICD-10-CM | POA: Diagnosis not present

## 2022-11-17 DIAGNOSIS — I5032 Chronic diastolic (congestive) heart failure: Secondary | ICD-10-CM | POA: Diagnosis not present

## 2022-11-19 DIAGNOSIS — M79675 Pain in left toe(s): Secondary | ICD-10-CM | POA: Diagnosis not present

## 2022-11-19 DIAGNOSIS — B351 Tinea unguium: Secondary | ICD-10-CM | POA: Diagnosis not present

## 2022-11-19 DIAGNOSIS — M79674 Pain in right toe(s): Secondary | ICD-10-CM | POA: Diagnosis not present

## 2022-12-08 DIAGNOSIS — N2581 Secondary hyperparathyroidism of renal origin: Secondary | ICD-10-CM | POA: Diagnosis not present

## 2022-12-08 DIAGNOSIS — R809 Proteinuria, unspecified: Secondary | ICD-10-CM | POA: Diagnosis not present

## 2022-12-08 DIAGNOSIS — N189 Chronic kidney disease, unspecified: Secondary | ICD-10-CM | POA: Diagnosis not present

## 2022-12-14 DIAGNOSIS — E1122 Type 2 diabetes mellitus with diabetic chronic kidney disease: Secondary | ICD-10-CM | POA: Diagnosis not present

## 2022-12-14 DIAGNOSIS — N184 Chronic kidney disease, stage 4 (severe): Secondary | ICD-10-CM | POA: Diagnosis not present

## 2022-12-14 DIAGNOSIS — R809 Proteinuria, unspecified: Secondary | ICD-10-CM | POA: Diagnosis not present

## 2022-12-14 DIAGNOSIS — I5032 Chronic diastolic (congestive) heart failure: Secondary | ICD-10-CM | POA: Diagnosis not present

## 2022-12-17 ENCOUNTER — Encounter: Payer: Self-pay | Admitting: Nephrology

## 2022-12-18 DIAGNOSIS — I5032 Chronic diastolic (congestive) heart failure: Secondary | ICD-10-CM | POA: Diagnosis not present

## 2022-12-18 DIAGNOSIS — N184 Chronic kidney disease, stage 4 (severe): Secondary | ICD-10-CM | POA: Diagnosis not present

## 2022-12-23 ENCOUNTER — Ambulatory Visit (INDEPENDENT_AMBULATORY_CARE_PROVIDER_SITE_OTHER): Payer: Medicare Other | Admitting: Gastroenterology

## 2022-12-23 ENCOUNTER — Telehealth: Payer: Self-pay | Admitting: *Deleted

## 2022-12-23 ENCOUNTER — Encounter: Payer: Self-pay | Admitting: Gastroenterology

## 2022-12-23 VITALS — BP 111/64 | HR 66 | Temp 97.9°F | Ht 61.5 in | Wt 243.2 lb

## 2022-12-23 DIAGNOSIS — Z86010 Personal history of colonic polyps: Secondary | ICD-10-CM

## 2022-12-23 DIAGNOSIS — K31819 Angiodysplasia of stomach and duodenum without bleeding: Secondary | ICD-10-CM

## 2022-12-23 DIAGNOSIS — D500 Iron deficiency anemia secondary to blood loss (chronic): Secondary | ICD-10-CM

## 2022-12-23 DIAGNOSIS — D509 Iron deficiency anemia, unspecified: Secondary | ICD-10-CM | POA: Diagnosis not present

## 2022-12-23 NOTE — Telephone Encounter (Signed)
I left a vm for surgery coordinator asking for a date for EGD.

## 2022-12-23 NOTE — Telephone Encounter (Signed)
Will route to pharm for input. Last OV 08/2022, doing well, f/u 6 months. Should be able to finalize once pharm reviews.

## 2022-12-23 NOTE — Telephone Encounter (Signed)
I called to speak to pt. I spoke with Tammy, the director at the assisted living she is in. Tammy states that pt is not able to understand what is going on and that she has a legal guardian at [employer]. They will need written order when to stop any meds. What is the next step?

## 2022-12-23 NOTE — Telephone Encounter (Signed)
Patient with diagnosis of A Fib on Eliquis for anticoagulation.    Procedure: Esophagogastroduodenoscopy (EGD)   Date of procedure: TBD   CHA2DS2-VASc Score = 6  This indicates a 9.7% annual risk of stroke. The patient's score is based upon: CHF History: 1 HTN History: 1 Diabetes History: 1 Stroke History: 0 Vascular Disease History: 1 Age Score: 1 Gender Score: 1   CrCl 47 mL/min Platelet count 323K   Per office protocol, patient can hold Eliquis for 2 days prior to procedure.    **This guidance is not considered finalized until pre-operative APP has relayed final recommendations.**

## 2022-12-23 NOTE — Telephone Encounter (Signed)
Name: [name]  DOB: 31-Oct-1955  MRN: [id number]  Primary Cardiologist: Nona Dell, MD   Preoperative team, please contact this patient and set up a phone call appointment for further preoperative risk assessment. Please obtain consent and complete medication review. Thank you for your help.  I confirm that guidance regarding antiplatelet and oral anticoagulation therapy has been completed and, if necessary, noted below.  Patient with diagnosis of A Fib on Eliquis for anticoagulation.     Procedure: Esophagogastroduodenoscopy (EGD)    Date of procedure: TBD     CHA2DS2-VASc Score = 6  This indicates a 9.7% annual risk of stroke. The patient's score is based upon: CHF History: 1 HTN History: 1 Diabetes History: 1 Stroke History: 0 Vascular Disease History: 1 Age Score: 1 Gender Score: 1   CrCl 47 mL/min Platelet count 323K     Per office protocol, patient can hold Eliquis for 2 days prior to procedure.    Ronney Asters, NP 12/23/2022, 3:08 PM [facility]

## 2022-12-23 NOTE — Telephone Encounter (Signed)
Preop Team , Once procedure is scheduled we will be able to provide recommendations. We can fax medication recommendations/ orders to the parties that need orders. Thank you.   Thomasene Ripple. Anay Rathe NP-C     12/23/2022, 3:49 PM [facility] [address] Office [phone] Fax [phone]

## 2022-12-23 NOTE — Patient Instructions (Signed)
Complete labs at [facility].  Upper endoscopy to be scheduled.

## 2022-12-23 NOTE — H&P (View-Only) (Signed)
GI Office Note    Referring Provider: Benetta Spar* Primary Care Physician:  Benetta Spar, MD  Primary Gastroenterologist: Roetta Sessions, MD   Chief Complaint   Chief Complaint  Patient presents with   Follow-up    HGB has dropped to 7.4 per nephrology    History of Present Illness   [name] is a 67 y.o. female presenting today at the request of Dr. Wolfgang Phoenix for anemia. She was last seen in 12/2021. 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    Patient has a DSS legal guardian, Roxanne Mins, who can be reached at [phone].   She has history of GAVE s/p APC in 11/2021 when she presented with Hgb of 4.8.    Labs 12/08/2022: WBC 11.1, Hgb 7.4, Hct 32.3, Platelet 323K, Cre 2.04, MCV 95.  Labs 07/2022: Hgb 9.7  Labs 03/2022: ferritin 12  Today: patient came in today due to low Hgb of 7.4 on September 10.  Stools are dark on iron.  Has bowel movement about every other day.  Denies any heartburn, does have hiccups at times which she associates with reflux.  No nausea or vomiting.  However she does have deep coughing sometimes this will induce vomiting.  Appetite fair.  No abdominal pain.   Previous work-up:   EGD 2012 for IDA showed small hiatal hernia, couple of antral erosions, otherwise normal exam.  Biopsies negative for celiac disease or H. pylori.  Capsule study not completed at this time because of multiple Hemoccult negative stools.   Colonoscopy April 2022 with seven 5 to 9mm polyps in the rectum and ascending colon resected.  Pathology revealed multiple tubular adenomas.  She had nonbleeding internal hemorrhoids.  Recommended 3-year surveillance exam.   She presented to GI again for IDA back in May 2023.  EGD updated in July 2023.  She had angry appearing  gastric mucosa diffusely with erythema patchy erosions nodularity in the antrum.  Marked friability of the mucosa with touch bleeding.  IDA felt to be due to gastric blood loss in the setting of Eliquis.  Gastric biopsy showing chemical/reactive gastropathy.  No H. pylori.  Capsule study not felt to be indicated.  EGD 11/2021: -gastric antral vascular ectasia with bleeding s/p APC  Medications   Current Outpatient Medications  Medication Sig Dispense Refill   acetaminophen (TYLENOL) 325 MG tablet Take 325 mg by mouth every 12 (twelve) hours as needed for moderate pain.     albuterol (PROVENTIL HFA;VENTOLIN HFA) 108 (90 Base) MCG/ACT inhaler Inhale 2 puffs into the lungs every 4 (four) hours as needed for shortness of breath.     apixaban (ELIQUIS) 5 MG TABS tablet Take 1 tablet (5 mg total) by mouth 2 (two) times daily. 60 tablet 3   calcitRIOL (ROCALTROL) 0.25 MCG capsule Take 0.25 mcg by mouth every Monday, Wednesday, and Friday.     cetirizine (ZYRTEC) 10 MG tablet Take 10 mg by mouth daily.     Cholecalciferol (VITAMIN D3) 50 MCG (2000 UT) TABS TAKE 1 TABLET BY MOUTH ONCE DAILY. 30 tablet 2   diltiazem (CARDIZEM CD) 240 MG 24 hr capsule Take 1 capsule (240 mg total) by mouth daily. 30 capsule 0   ferrous sulfate 325 (65 FE) MG tablet Take 1 tablet (325 mg total) by mouth daily with breakfast.  90 tablet 3   Finerenone (KERENDIA) 10 MG TABS Take 10 mg by mouth daily.     fluticasone (FLONASE) 50 MCG/ACT nasal spray Place 2 sprays into both nostrils daily as needed for allergies.     furosemide (LASIX) 40 MG tablet Take 20 mg by mouth See admin instructions. Take 20 mg twice daily, alternating with 20 mg daily every other day     GERI-TUSSIN DM 10-100 MG/5ML liquid Take by mouth.     insulin regular human CONCENTRATED (HUMULIN R U-500 KWIKPEN) 500 UNIT/ML KwikPen Inject 40 Units into the skin 3 (three) times daily before meals. 6 mL 2   ipratropium-albuterol (DUONEB) 0.5-2.5 (3) MG/3ML SOLN  Take 3 mLs by nebulization every 6 (six) hours as needed (for cough).     isosorbide dinitrate (ISORDIL) 10 MG tablet Take 1 tablet (10 mg total) by mouth 3 (three) times daily. 90 tablet 0   loperamide (IMODIUM) 2 MG capsule Take 2-4 mg by mouth See admin instructions. Take 4 mg by mouth now: then 2 mg after each loose stool as needed, do not exceed 5 doses in 24 hours.     losartan (COZAAR) 25 MG tablet Take 12.5 mg by mouth daily.     metolazone (ZAROXOLYN) 5 MG tablet Take 5 mg by mouth in the morning.     metoprolol tartrate (LOPRESSOR) 50 MG tablet Take 50 mg by mouth 2 (two) times daily.     montelukast (SINGULAIR) 10 MG tablet Take 10 mg by mouth at bedtime.     pantoprazole (PROTONIX) 40 MG tablet Take one po 30 minutes prior to breakfast daily. 90 tablet 3   polyethylene glycol powder (GLYCOLAX/MIRALAX) 17 GM/SCOOP powder Take 17 g by mouth daily as needed for mild constipation or moderate constipation.     potassium chloride SA (KLOR-CON M) 20 MEQ tablet Take 20 mEq by mouth daily.     rosuvastatin (CRESTOR) 20 MG tablet TAKE (1) TABLET BY MOUTH ONCE DAILY. (Patient taking differently: Take 10 mg by mouth daily.) 30 tablet 3   TRELEGY ELLIPTA 100-62.5-25 MCG/ACT AEPB Inhale 1 puff into the lungs daily.     triamcinolone cream (KENALOG) 0.5 % Apply 1 application topically every 12 (twelve) hours as needed (applied to bilateral legs for allergic dermatitis).     No current facility-administered medications for this visit.    Allergies   Allergies as of 12/23/2022   (No Known Allergies)     Past Medical History   Past Medical History:  Diagnosis Date   Arthritis    Asthma    Atrial fibrillation (HCC)    Carcinoid tumor determined by biopsy of lung 05/2018   Left lung   Chronic diastolic CHF (congestive heart failure) (HCC)    CKD (chronic kidney disease), stage III (HCC)    Diabetes mellitus type 2 in obese    Essential hypertension    Iron deficiency anemia 10/16/2010    Mental handicap 10/16/2010   Mild CAD 2013   Morbid obesity (HCC)    OSA (obstructive sleep apnea)     Past Surgical History   Past Surgical History:  Procedure Laterality Date   ABDOMINAL HYSTERECTOMY     AV FISTULA PLACEMENT Right 06/06/2018   Procedure: ARTERIOVENOUS (AV) FISTULA CREATION;  Surgeon: Cephus Shelling, MD;  Location: [facility] OR;  Service: Vascular;  Laterality: Right;   BIOPSY  10/16/2021   Procedure: BIOPSY;  Surgeon: Corbin Ade, MD;  Location: [facility];  Service: Endoscopy;;  BRONCHIAL DILITATION  12/13/2020   Procedure: BRONCHIAL DILITATION;  Surgeon: Josephine Igo, DO;  Location: [facility] ENDOSCOPY;  Service: Pulmonary;;   CESAREAN SECTION     CHOLECYSTECTOMY     COLONOSCOPY  08/2010   normal TI, sigmoid polyp (adenoma ). Next TCS due  08/2015,   COLONOSCOPY WITH PROPOFOL N/A 07/08/2020   Procedure: COLONOSCOPY WITH PROPOFOL;  Surgeon: Corbin Ade, MD;  Location: [facility];  Service: Endoscopy;  Laterality: N/A;  AM (diabetic and facility patient)   ESOPHAGOGASTRODUODENOSCOPY  08/2010   antral and duodenal erosions s/p bx (chronic gastritis, no h.pylori, no celiac dz ), hiatal hernia   ESOPHAGOGASTRODUODENOSCOPY (EGD) WITH PROPOFOL N/A 10/16/2021   Procedure: ESOPHAGOGASTRODUODENOSCOPY (EGD) WITH PROPOFOL;  Surgeon: Corbin Ade, MD;  Location: [facility];  Service: Endoscopy;  Laterality: N/A;  11:45am   ESOPHAGOGASTRODUODENOSCOPY (EGD) WITH PROPOFOL N/A 12/18/2021   Procedure: ESOPHAGOGASTRODUODENOSCOPY (EGD) WITH PROPOFOL;  Surgeon: Lanelle Bal, DO;  Location: [facility];  Service: Endoscopy;  Laterality: N/A;   FISTULA SUPERFICIALIZATION Right 08/25/2018   Procedure: FISTULA SUPERFICIALIZATION RIGHT ARM;  Surgeon: Maeola Harman, MD;  Location: [facility] OR;  Service: Cardiovascular;  Laterality: Right;   HOT HEMOSTASIS  12/18/2021   Procedure: HOT HEMOSTASIS (ARGON PLASMA COAGULATION/BICAP);  Surgeon: Lanelle Bal, DO;  Location: [facility];  Service: Endoscopy;;   IR FLUORO GUIDE CV LINE RIGHT  05/31/2018   IR US GUIDE VASC ACCESS RIGHT  05/31/2018   KNEE SURGERY     right knee @ 67 years of age   LEFT AND RIGHT HEART CATHETERIZATION WITH CORONARY ANGIOGRAM N/A 09/21/2011   Procedure: LEFT AND RIGHT HEART CATHETERIZATION WITH CORONARY ANGIOGRAM;  Surgeon: Ricki Rodriguez, MD;  Location: [facility] CATH LAB;  Service: Cardiovascular;  Laterality: N/A;   POLYPECTOMY  07/08/2020   Procedure: POLYPECTOMY INTESTINAL;  Surgeon: Corbin Ade, MD;  Location: [facility];  Service: Endoscopy;;   VIDEO BRONCHOSCOPY Left 12/13/2020   Procedure: VIDEO BRONCHOSCOPY WITHOUT FLUORO;  Surgeon: Josephine Igo, DO;  Location: [facility] END[facility];  Service: Pulmonary;  Laterality: Left;  Cryotherapy    Past Family History   Family History  Problem Relation Age of Onset   Diabetes Mother    Hypertension Mother    Heart failure Mother    Hypertension Father    Atrial fibrillation Father    Hypertension Son    Kidney disease Other        son reportedly has had cyst on kidney and lung s/p surgery at [facility]   Colon cancer Neg Hx     Past Social History   Social History   Socioeconomic History   Marital status: Divorced    Spouse name: Not on file   Number of children: 1   Years of education: Not on file   Highest education level: Not on file  Occupational History    Employer: UNEMPLOYED  Tobacco Use   Smoking status: Former    Current packs/day: 0.00    Types: Cigarettes    Start date: 04/11/1996    Quit date: 04/11/1996    Years since quitting: 26.7   Smokeless tobacco: Never   Tobacco comments:    quit a couple year ago  Vaping Use   Vaping status: Never Used  Substance and Sexual Activity   Alcohol use: No   Drug use: No   Sexual activity: Never  Other Topics Concern   Not on file  Social History Narrative  Lives with parents.    Social Determinants of Health   Financial Resource Strain: Not on file  Food  Insecurity: No Food Insecurity (12/17/2021)   Hunger Vital Sign    Worried About Running Out of Food in the Last Year: Never true    Ran Out of Food in the Last Year: Never true  Transportation Needs: No Transportation Needs (12/17/2021)   PRAPARE - Administrator, Civil Service (Medical): No    Lack of Transportation (Non-Medical): No  Physical Activity: Not on file  Stress: Not on file  Social Connections: Not on file  Intimate Partner Violence: Not At Risk (12/17/2021)   Humiliation, Afraid, Rape, and Kick questionnaire    Fear of Current or Ex-Partner: No    Emotionally Abused: No    Physically Abused: No    Sexually Abused: No    Review of Systems   General: Negative for anorexia, weight loss, fever, chills, fatigue, weakness. ENT: Negative for hoarseness, difficulty swallowing , nasal congestion. CV: Negative for chest pain, angina, palpitations, dyspnea on exertion, peripheral edema.  Respiratory: Negative for dyspnea at rest, dyspnea on exertion, cough, sputum, wheezing.  GI: See history of present illness. GU:  Negative for dysuria, hematuria, urinary incontinence, urinary frequency, nocturnal urination.  Endo: Negative for unusual weight change.     Physical Exam   BP 111/64 (BP Location: Left Arm, Patient Position: Sitting, Cuff Size: Normal) Comment (BP Location): left forearm per pt  Pulse 66   Temp 97.9 F (36.6 C) (Oral)   Ht 5' 1.5" (1.562 m)   Wt 243 lb 3.2 oz (110.3 kg)   SpO2 100%   BMI 45.21 kg/m    General: Well-nourished, well-developed in no acute distress.  Eyes: No icterus. Mouth: Oropharyngeal mucosa moist and pink   Lungs: Clear to auscultation bilaterally.  Heart: Regular rate and rhythm, no murmurs rubs or gallops.  Abdomen: Bowel sounds are normal, nontender, nondistended, no hepatosplenomegaly or masses,  no abdominal bruits or hernia , no rebound or guarding.  Rectal: Not performed Extremities: No lower extremity edema. No  clubbing or deformities. Neuro: Alert and oriented x 4   Skin: Warm and dry, no jaundice.   Psych: Alert and cooperative, normal mood and affect.  Labs   Lab Results  Component Value Date   NA 136 10/06/2022   CL 94 (L) 10/06/2022   K 3.8 10/06/2022   CO2 23 10/06/2022   BUN 53 (H) 10/06/2022   CREATININE 2.02 (H) 10/06/2022   EGFR 27 (L) 10/06/2022   CALCIUM 9.6 10/06/2022   PHOS 5.9 (H) 06/09/2018   ALBUMIN 3.9 10/06/2022   GLUCOSE 173 (H) 10/06/2022   Lab Results  Component Value Date   ALT 8 10/06/2022   AST 12 10/06/2022   ALKPHOS 84 10/06/2022   BILITOT 0.3 10/06/2022   Lab Results  Component Value Date   WBC 8.4 07/31/2022   HGB 9.7 (L) 07/31/2022   HCT 30.2 (L) 07/31/2022   MCV 92.4 07/31/2022   PLT 235 07/31/2022      See hpi  Imaging Studies   No results found.  Assessment   *IDA *GAVE  *H/O adenomatous colon polyps  Declined in Hgb likely due to occult GI bleeding from GAVE in setting of anticoagulation.   PLAN   Recheck CBC today to make sure she is not in need of blood transfusion. EGD with Dr. Jena Gauss. ASA 3/4. She has a legal guardian who will need to provide consent.  I have discussed the risks, alternatives, benefits with regards to but not limited to the risk of reaction to medication, bleeding, infection, perforation and the patient is agreeable to proceed. Written consent to be obtained. Will request approval to hold Eliquis 48 hours prior to EGD. Colonoscopy next year unless clinical course warrants sooner exam.   Leanna Battles. Melvyn Neth, MHS, PA-C [facility]

## 2022-12-23 NOTE — Progress Notes (Signed)
GI Office Note    Referring Provider: Benetta Spar* Primary Care Physician:  Benetta Spar, MD  Primary Gastroenterologist: Roetta Sessions, MD   Chief Complaint   Chief Complaint  Patient presents with   Follow-up    HGB has dropped to 7.4 per nephrology    History of Present Illness   [name] is a 67 y.o. female presenting today at the request of Dr. Wolfgang Phoenix for anemia. She was last seen in 12/2021. 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    Patient has a DSS legal guardian, Roxanne Mins, who can be reached at [phone].   She has history of GAVE s/p APC in 11/2021 when she presented with Hgb of 4.8.    Labs 12/08/2022: WBC 11.1, Hgb 7.4, Hct 32.3, Platelet 323K, Cre 2.04, MCV 95.  Labs 07/2022: Hgb 9.7  Labs 03/2022: ferritin 12  Today: patient came in today due to low Hgb of 7.4 on September 10.  Stools are dark on iron.  Has bowel movement about every other day.  Denies any heartburn, does have hiccups at times which she associates with reflux.  No nausea or vomiting.  However she does have deep coughing sometimes this will induce vomiting.  Appetite fair.  No abdominal pain.   Previous work-up:   EGD 2012 for IDA showed small hiatal hernia, couple of antral erosions, otherwise normal exam.  Biopsies negative for celiac disease or H. pylori.  Capsule study not completed at this time because of multiple Hemoccult negative stools.   Colonoscopy April 2022 with seven 5 to 9mm polyps in the rectum and ascending colon resected.  Pathology revealed multiple tubular adenomas.  She had nonbleeding internal hemorrhoids.  Recommended 3-year surveillance exam.   She presented to GI again for IDA back in May 2023.  EGD updated in July 2023.  She had angry appearing  gastric mucosa diffusely with erythema patchy erosions nodularity in the antrum.  Marked friability of the mucosa with touch bleeding.  IDA felt to be due to gastric blood loss in the setting of Eliquis.  Gastric biopsy showing chemical/reactive gastropathy.  No H. pylori.  Capsule study not felt to be indicated.  EGD 11/2021: -gastric antral vascular ectasia with bleeding s/p APC  Medications   Current Outpatient Medications  Medication Sig Dispense Refill   acetaminophen (TYLENOL) 325 MG tablet Take 325 mg by mouth every 12 (twelve) hours as needed for moderate pain.     albuterol (PROVENTIL HFA;VENTOLIN HFA) 108 (90 Base) MCG/ACT inhaler Inhale 2 puffs into the lungs every 4 (four) hours as needed for shortness of breath.     apixaban (ELIQUIS) 5 MG TABS tablet Take 1 tablet (5 mg total) by mouth 2 (two) times daily. 60 tablet 3   calcitRIOL (ROCALTROL) 0.25 MCG capsule Take 0.25 mcg by mouth every Monday, Wednesday, and Friday.     cetirizine (ZYRTEC) 10 MG tablet Take 10 mg by mouth daily.     Cholecalciferol (VITAMIN D3) 50 MCG (2000 UT) TABS TAKE 1 TABLET BY MOUTH ONCE DAILY. 30 tablet 2   diltiazem (CARDIZEM CD) 240 MG 24 hr capsule Take 1 capsule (240 mg total) by mouth daily. 30 capsule 0   ferrous sulfate 325 (65 FE) MG tablet Take 1 tablet (325 mg total) by mouth daily with breakfast.  90 tablet 3   Finerenone (KERENDIA) 10 MG TABS Take 10 mg by mouth daily.     fluticasone (FLONASE) 50 MCG/ACT nasal spray Place 2 sprays into both nostrils daily as needed for allergies.     furosemide (LASIX) 40 MG tablet Take 20 mg by mouth See admin instructions. Take 20 mg twice daily, alternating with 20 mg daily every other day     GERI-TUSSIN DM 10-100 MG/5ML liquid Take by mouth.     insulin regular human CONCENTRATED (HUMULIN R U-500 KWIKPEN) 500 UNIT/ML KwikPen Inject 40 Units into the skin 3 (three) times daily before meals. 6 mL 2   ipratropium-albuterol (DUONEB) 0.5-2.5 (3) MG/3ML SOLN  Take 3 mLs by nebulization every 6 (six) hours as needed (for cough).     isosorbide dinitrate (ISORDIL) 10 MG tablet Take 1 tablet (10 mg total) by mouth 3 (three) times daily. 90 tablet 0   loperamide (IMODIUM) 2 MG capsule Take 2-4 mg by mouth See admin instructions. Take 4 mg by mouth now: then 2 mg after each loose stool as needed, do not exceed 5 doses in 24 hours.     losartan (COZAAR) 25 MG tablet Take 12.5 mg by mouth daily.     metolazone (ZAROXOLYN) 5 MG tablet Take 5 mg by mouth in the morning.     metoprolol tartrate (LOPRESSOR) 50 MG tablet Take 50 mg by mouth 2 (two) times daily.     montelukast (SINGULAIR) 10 MG tablet Take 10 mg by mouth at bedtime.     pantoprazole (PROTONIX) 40 MG tablet Take one po 30 minutes prior to breakfast daily. 90 tablet 3   polyethylene glycol powder (GLYCOLAX/MIRALAX) 17 GM/SCOOP powder Take 17 g by mouth daily as needed for mild constipation or moderate constipation.     potassium chloride SA (KLOR-CON M) 20 MEQ tablet Take 20 mEq by mouth daily.     rosuvastatin (CRESTOR) 20 MG tablet TAKE (1) TABLET BY MOUTH ONCE DAILY. (Patient taking differently: Take 10 mg by mouth daily.) 30 tablet 3   TRELEGY ELLIPTA 100-62.5-25 MCG/ACT AEPB Inhale 1 puff into the lungs daily.     triamcinolone cream (KENALOG) 0.5 % Apply 1 application topically every 12 (twelve) hours as needed (applied to bilateral legs for allergic dermatitis).     No current facility-administered medications for this visit.    Allergies   Allergies as of 12/23/2022   (No Known Allergies)     Past Medical History   Past Medical History:  Diagnosis Date   Arthritis    Asthma    Atrial fibrillation (HCC)    Carcinoid tumor determined by biopsy of lung 05/2018   Left lung   Chronic diastolic CHF (congestive heart failure) (HCC)    CKD (chronic kidney disease), stage III (HCC)    Diabetes mellitus type 2 in obese    Essential hypertension    Iron deficiency anemia 10/16/2010    Mental handicap 10/16/2010   Mild CAD 2013   Morbid obesity (HCC)    OSA (obstructive sleep apnea)     Past Surgical History   Past Surgical History:  Procedure Laterality Date   ABDOMINAL HYSTERECTOMY     AV FISTULA PLACEMENT Right 06/06/2018   Procedure: ARTERIOVENOUS (AV) FISTULA CREATION;  Surgeon: Cephus Shelling, MD;  Location: [facility] OR;  Service: Vascular;  Laterality: Right;   BIOPSY  10/16/2021   Procedure: BIOPSY;  Surgeon: Corbin Ade, MD;  [facility];  Service: Endoscopy;;  BRONCHIAL DILITATION  12/13/2020   Procedure: BRONCHIAL DILITATION;  Surgeon: Josephine Igo, DO;  Location: [facility] ENDOSCOPY;  Service: Pulmonary;;   CESAREAN SECTION     CHOLECYSTECTOMY     COLONOSCOPY  08/2010   normal TI, sigmoid polyp (adenoma ). Next TCS due  08/2015,   COLONOSCOPY WITH PROPOFOL N/A 07/08/2020   Procedure: COLONOSCOPY WITH PROPOFOL;  Surgeon: Corbin Ade, MD;  [facility];  Service: Endoscopy;  Laterality: N/A;  AM (diabetic and facility patient)   ESOPHAGOGASTRODUODENOSCOPY  08/2010   antral and duodenal erosions s/p bx (chronic gastritis, no h.pylori, no celiac dz ), hiatal hernia   ESOPHAGOGASTRODUODENOSCOPY (EGD) WITH PROPOFOL N/A 10/16/2021   Procedure: ESOPHAGOGASTRODUODENOSCOPY (EGD) WITH PROPOFOL;  Surgeon: Corbin Ade, MD;  [facility];  Service: Endoscopy;  Laterality: N/A;  11:45am   ESOPHAGOGASTRODUODENOSCOPY (EGD) WITH PROPOFOL N/A 12/18/2021   Procedure: ESOPHAGOGASTRODUODENOSCOPY (EGD) WITH PROPOFOL;  Surgeon: Lanelle Bal, DO;  [facility];  Service: Endoscopy;  Laterality: N/A;   FISTULA SUPERFICIALIZATION Right 08/25/2018   Procedure: FISTULA SUPERFICIALIZATION RIGHT ARM;  Surgeon: Maeola Harman, MD;  Location: [facility] OR;  Service: Cardiovascular;  Laterality: Right;   HOT HEMOSTASIS  12/18/2021   Procedure: HOT HEMOSTASIS (ARGON PLASMA COAGULATION/BICAP);  Surgeon: Lanelle Bal, DO;  [facility];  Service: Endoscopy;;   IR FLUORO GUIDE CV LINE RIGHT  05/31/2018   IR US GUIDE VASC ACCESS RIGHT  05/31/2018   KNEE SURGERY     right knee @ 67 years of age   LEFT AND RIGHT HEART CATHETERIZATION WITH CORONARY ANGIOGRAM N/A 09/21/2011   Procedure: LEFT AND RIGHT HEART CATHETERIZATION WITH CORONARY ANGIOGRAM;  Surgeon: Ricki Rodriguez, MD;  Location: [facility] CATH [facility];  Service: Cardiovascular;  Laterality: N/A;   POLYPECTOMY  07/08/2020   Procedure: POLYPECTOMY INTESTINAL;  Surgeon: Corbin Ade, MD;  [facility];  Service: Endoscopy;;   VIDEO BRONCHOSCOPY Left 12/13/2020   Procedure: VIDEO BRONCHOSCOPY WITHOUT FLUORO;  Surgeon: Josephine Igo, DO;  Location: [facility];  Service: Pulmonary;  Laterality: Left;  Cryotherapy    Past Family History   Family History  Problem Relation Age of Onset   Diabetes Mother    Hypertension Mother    Heart failure Mother    Hypertension Father    Atrial fibrillation Father    Hypertension Son    Kidney disease Other        son reportedly has had cyst on kidney and lung s/p surgery at [facility]   Colon cancer Neg Hx     Past Social History   Social History   Socioeconomic History   Marital status: Divorced    Spouse name: Not on file   Number of children: 1   Years of education: Not on file   Highest education level: Not on file  Occupational History    Employer: UNEMPLOYED  Tobacco Use   Smoking status: Former    Current packs/day: 0.00    Types: Cigarettes    Start date: 04/11/1996    Quit date: 04/11/1996    Years since quitting: 26.7   Smokeless tobacco: Never   Tobacco comments:    quit a couple year ago  Vaping Use   Vaping status: Never Used  Substance and Sexual Activity   Alcohol use: No   Drug use: No   Sexual activity: Never  Other Topics Concern   Not on file  Social History Narrative  Lives with parents.    Social Determinants of Health   Financial Resource Strain: Not on file  Food  Insecurity: No Food Insecurity (12/17/2021)   Hunger Vital Sign    Worried About Running Out of Food in the Last Year: Never true    Ran Out of Food in the Last Year: Never true  Transportation Needs: No Transportation Needs (12/17/2021)   PRAPARE - Administrator, Civil Service (Medical): No    Lack of Transportation (Non-Medical): No  Physical Activity: Not on file  Stress: Not on file  Social Connections: Not on file  Intimate Partner Violence: Not At Risk (12/17/2021)   Humiliation, Afraid, Rape, and Kick questionnaire    Fear of Current or Ex-Partner: No    Emotionally Abused: No    Physically Abused: No    Sexually Abused: No    Review of Systems   General: Negative for anorexia, weight loss, fever, chills, fatigue, weakness. ENT: Negative for hoarseness, difficulty swallowing , nasal congestion. CV: Negative for chest pain, angina, palpitations, dyspnea on exertion, peripheral edema.  Respiratory: Negative for dyspnea at rest, dyspnea on exertion, cough, sputum, wheezing.  GI: See history of present illness. GU:  Negative for dysuria, hematuria, urinary incontinence, urinary frequency, nocturnal urination.  Endo: Negative for unusual weight change.     Physical Exam   BP 111/64 (BP Location: Left Arm, Patient Position: Sitting, Cuff Size: Normal) Comment (BP Location): left forearm per pt  Pulse 66   Temp 97.9 F (36.6 C) (Oral)   Ht 5' 1.5" (1.562 m)   Wt 243 lb 3.2 oz (110.3 kg)   SpO2 100%   BMI 45.21 kg/m    General: Well-nourished, well-developed in no acute distress.  Eyes: No icterus. Mouth: Oropharyngeal mucosa moist and pink   Lungs: Clear to auscultation bilaterally.  Heart: Regular rate and rhythm, no murmurs rubs or gallops.  Abdomen: Bowel sounds are normal, nontender, nondistended, no hepatosplenomegaly or masses,  no abdominal bruits or hernia , no rebound or guarding.  Rectal: Not performed Extremities: No lower extremity edema. No  clubbing or deformities. Neuro: Alert and oriented x 4   Skin: Warm and dry, no jaundice.   Psych: Alert and cooperative, normal mood and affect.  Labs   Lab Results  Component Value Date   NA 136 10/06/2022   CL 94 (L) 10/06/2022   K 3.8 10/06/2022   CO2 23 10/06/2022   BUN 53 (H) 10/06/2022   CREATININE 2.02 (H) 10/06/2022   EGFR 27 (L) 10/06/2022   CALCIUM 9.6 10/06/2022   PHOS 5.9 (H) 06/09/2018   ALBUMIN 3.9 10/06/2022   GLUCOSE 173 (H) 10/06/2022   Lab Results  Component Value Date   ALT 8 10/06/2022   AST 12 10/06/2022   ALKPHOS 84 10/06/2022   BILITOT 0.3 10/06/2022   Lab Results  Component Value Date   WBC 8.4 07/31/2022   HGB 9.7 (L) 07/31/2022   HCT 30.2 (L) 07/31/2022   MCV 92.4 07/31/2022   PLT 235 07/31/2022      See hpi  Imaging Studies   No results found.  Assessment   *IDA *GAVE  *H/O adenomatous colon polyps  Declined in Hgb likely due to occult GI bleeding from GAVE in setting of anticoagulation.   PLAN   Recheck CBC today to make sure she is not in need of blood transfusion. EGD with Dr. Jena Gauss. ASA 3/4. She has a legal guardian who will need to provide consent.  I have discussed the risks, alternatives, benefits with regards to but not limited to the risk of reaction to medication, bleeding, infection, perforation and the patient is agreeable to proceed. Written consent to be obtained. Will request approval to hold Eliquis 48 hours prior to EGD. Colonoscopy next year unless clinical course warrants sooner exam.   Leanna Battles. Melvyn Neth, MHS, PA-C [facility]

## 2022-12-23 NOTE — Telephone Encounter (Signed)
Request for patient to stop medication prior to procedure or is needing cleareance  12/23/22  [name] 11-21-1955  What type of surgery is being performed? Esophagogastroduodenoscopy (EGD)  When is surgery scheduled? TBD  What type of clearance is required (medical or pharmacy to hold medication or both? medication  Are there any medications that need to be held prior to surgery and how long? Elquis x 2 days  Name of physician performing surgery?  Dr.Rourk [facility] [employer]: [phone] Fax: [phone]  Anethesia type (none, local, MAC, general)? MAC

## 2022-12-24 ENCOUNTER — Telehealth: Payer: Self-pay | Admitting: Cardiology

## 2022-12-24 NOTE — Telephone Encounter (Signed)
Surgery scheduler Rocco Serene, LPN sent a message back to me:  I received your vm. I sent a message to the PA that pt seen in office asking about scheduling the pt. The provider that is doing the procedure is booked until November. I'm making sure it is ok with her or if pt needs to be scheduled sooner. I will let you know as soon as I find out something.

## 2022-12-24 NOTE — Telephone Encounter (Signed)
Bolick, Rachel A51 minutes ago (12:56 PM)   RB Office giving the nurse a callback. Please advise       Note   Melonie [place] [phone]  Leonia Reader A53 minutes ago (12:55 PM)   Option 5  Incoming call   Left message for surgery scheduler Tammy to call back as to we are needing a date for procedure, per pre op APP, see previous notes. Pt resides in a nursing facility and will need to know date of procedure to be able to schedule tele appt in a timely manner.

## 2022-12-24 NOTE — Telephone Encounter (Signed)
Returned call to pre-op to return call.

## 2022-12-24 NOTE — Telephone Encounter (Signed)
Office giving the nurse a callback. Please advise

## 2022-12-28 DIAGNOSIS — D500 Iron deficiency anemia secondary to blood loss (chronic): Secondary | ICD-10-CM | POA: Diagnosis not present

## 2022-12-28 DIAGNOSIS — K31819 Angiodysplasia of stomach and duodenum without bleeding: Secondary | ICD-10-CM | POA: Diagnosis not present

## 2022-12-29 LAB — CBC WITH DIFFERENTIAL/PLATELET
Basophils Absolute: 0.1 10*3/uL (ref 0.0–0.2)
Basos: 1 %
EOS (ABSOLUTE): 0.2 10*3/uL (ref 0.0–0.4)
Eos: 3 %
Hematocrit: 27.9 % — ABNORMAL LOW (ref 34.0–46.6)
Hemoglobin: 8.1 g/dL — ABNORMAL LOW (ref 11.1–15.9)
Immature Grans (Abs): 0.1 10*3/uL (ref 0.0–0.1)
Immature Granulocytes: 1 %
Lymphocytes Absolute: 0.9 10*3/uL (ref 0.7–3.1)
Lymphs: 11 %
MCH: 26.6 pg (ref 26.6–33.0)
MCHC: 29 g/dL — ABNORMAL LOW (ref 31.5–35.7)
MCV: 92 fL (ref 79–97)
Monocytes Absolute: 0.7 10*3/uL (ref 0.1–0.9)
Monocytes: 9 %
Neutrophils Absolute: 6.2 10*3/uL (ref 1.4–7.0)
Neutrophils: 75 %
Platelets: 302 10*3/uL (ref 150–450)
RBC: 3.04 x10E6/uL — ABNORMAL LOW (ref 3.77–5.28)
RDW: 14.3 % (ref 11.7–15.4)
WBC: 8.2 10*3/uL (ref 3.4–10.8)

## 2022-12-30 DIAGNOSIS — Z23 Encounter for immunization: Secondary | ICD-10-CM | POA: Diagnosis not present

## 2022-12-31 ENCOUNTER — Ambulatory Visit (HOSPITAL_COMMUNITY)
Admission: RE | Admit: 2022-12-31 | Discharge: 2022-12-31 | Disposition: A | Discharge status: Home / Self Care | Payer: Medicare Other | Source: Ambulatory Visit | Attending: Hematology | Admitting: Hematology

## 2022-12-31 DIAGNOSIS — C7A00 Malignant carcinoid tumor of unspecified site: Secondary | ICD-10-CM | POA: Diagnosis not present

## 2022-12-31 DIAGNOSIS — J9811 Atelectasis: Secondary | ICD-10-CM | POA: Diagnosis not present

## 2022-12-31 DIAGNOSIS — I700 Atherosclerosis of aorta: Secondary | ICD-10-CM | POA: Diagnosis not present

## 2022-12-31 DIAGNOSIS — D3A090 Benign carcinoid tumor of the bronchus and lung: Secondary | ICD-10-CM | POA: Insufficient documentation

## 2022-12-31 DIAGNOSIS — D3501 Benign neoplasm of right adrenal gland: Secondary | ICD-10-CM | POA: Diagnosis not present

## 2022-12-31 NOTE — Telephone Encounter (Signed)
Pt has appt with Randall An, [facility] 03/16/23. I have added to appt notes need pre op clearance.

## 2023-01-01 NOTE — Telephone Encounter (Signed)
Anne R., With Hgb of 7, I feel like we need to proceed with EGD prior to November. I have asked Dr. Levon Hedger if he would be willing to do EGD and he agreed. Let's move forward as soon as possible.  He is requesting holding eliquis 72 hours if possible but said 48 would be acceptable if that is what cardiology prefers.

## 2023-01-04 NOTE — Telephone Encounter (Signed)
LMOVM for Hiram Gash, legal guardian, to return call

## 2023-01-05 NOTE — Telephone Encounter (Signed)
Pt has been scheduled for 01/19/23 at 2:30 pm with Dr.Castaneda.

## 2023-01-06 ENCOUNTER — Encounter: Payer: Self-pay | Admitting: *Deleted

## 2023-01-06 NOTE — Telephone Encounter (Signed)
Thank you [name].  [name], please let the patient know she needs to hold her Eliquis for 48 hours prior to the procedure.  Thanks

## 2023-01-06 NOTE — Telephone Encounter (Signed)
I will be sure to update Nettie Elm, Environmental education officer at facility pt will need to hold Eliquis x 48 hours prior to procedure. I will fax over notes once pre op APP Bernadene Person, NP has completed.

## 2023-01-06 NOTE — Telephone Encounter (Signed)
Patient Name: [name]  DOB: [birth year] MRN: [id number]  Primary Cardiologist: Nona Dell, MD  Clinical pharmacists have reviewed the patient's past medical history, labs, and current medications as part of preoperative protocol coverage. The following recommendations have been made:  Patient with diagnosis of A Fib on Eliquis for anticoagulation.     Procedure: Esophagogastroduodenoscopy (EGD)    Date of procedure: TBD     CHA2DS2-VASc Score = 6  This indicates a 9.7% annual risk of stroke. The patient's score is based upon: CHF History: 1 HTN History: 1 Diabetes History: 1 Stroke History: 0 Vascular Disease History: 1 Age Score: 1 Gender Score: 1   CrCl 47 mL/min Platelet count 323K     Per office protocol, patient can hold Eliquis for 2 days prior to procedure.  Please resume Eliquis as soon as possible postprocedure, at the discretion of the surgeon.   Patient was originally scheduled for a televisit, however, it was confirmed by Rocco Serene, LPN with [facility] that request is for pharmacy clearance only. Therefore, televisit was canceled. See pharmacy clearance above.   I will route this recommendation to the requesting party via Epic fax function and remove from pre-op pool.  Please call with questions.  Joylene Grapes, NP 01/06/2023, 2:43 PM

## 2023-01-06 NOTE — Telephone Encounter (Signed)
I will send a message to Dr. Diona Browner scheduling team to see if we can get the pt in sooner. Per requesting office the pt's procedure needs to be done sooner. Pt's procedure is now 01/19/23.

## 2023-01-06 NOTE — Telephone Encounter (Signed)
Notes faxed to Nettie Elm, RN Supervisor at [facility] fax # [phone].

## 2023-01-06 NOTE — Telephone Encounter (Signed)
Noted. Instructions faxed to [phone] attn: Harrold Donath

## 2023-01-06 NOTE — Telephone Encounter (Signed)
Per scheduling team for Dr. Diona Browner: [name], CMA Currently our first available in [place] is 11/04 and [facility] 11/01.  I will see if pt will be willing to come to [facility].

## 2023-01-06 NOTE — Telephone Encounter (Signed)
I will forward to Dr. Levon Hedger as that looks to be the preforming MD for procedure as since it has been moved up.

## 2023-01-13 ENCOUNTER — Ambulatory Visit: Payer: Medicare Other | Attending: Physician Assistant

## 2023-01-13 DIAGNOSIS — Z01810 Encounter for preprocedural cardiovascular examination: Secondary | ICD-10-CM

## 2023-01-15 ENCOUNTER — Encounter (HOSPITAL_COMMUNITY): Payer: Self-pay

## 2023-01-15 ENCOUNTER — Encounter (HOSPITAL_COMMUNITY)
Admission: RE | Admit: 2023-01-15 | Discharge: 2023-01-15 | Disposition: A | Discharge status: Home / Self Care | Payer: Medicare Other | Source: Ambulatory Visit | Attending: Gastroenterology | Admitting: Gastroenterology

## 2023-01-15 VITALS — BP 113/38 | HR 73 | Temp 97.8°F | Ht 61.5 in | Wt 245.0 lb

## 2023-01-15 DIAGNOSIS — N186 End stage renal disease: Secondary | ICD-10-CM

## 2023-01-15 HISTORY — DX: Cardiac murmur, unspecified: R01.1

## 2023-01-15 HISTORY — DX: Gastro-esophageal reflux disease without esophagitis: K21.9

## 2023-01-15 NOTE — Plan of Care (Signed)
[facility] Tonsillectomy/Adenoidectomy, Postoperative PEDS care plan entered in error.

## 2023-01-15 NOTE — Patient Instructions (Signed)
[name]  01/15/2023     @PREFPERIOPPHARMACY @   Your procedure is scheduled on 01/19/2023.  Report to [facility] at 12:30 PM  Call this number if you have problems the morning of surgery:  [phone]  If you experience any cold or flu symptoms such as cough, fever, chills, shortness of breath, etc. between now and your scheduled surgery, please notify us at the above number.   Remember:   Please Follow the diet and prep instructions given to you by Dr Charise Carwin office.    You may drink clear liquids until 10:30 am the day of the procedure. .    Clear liquids allowed are:                    Water, Juice (No red color; non-citric and without pulp; diabetics please choose diet or no sugar options), Carbonated beverages (diabetics please choose diet or no sugar options), Clear Tea (No creamer, milk, or cream, including half & half and powdered creamer), Black Coffee Only (No creamer, milk or cream, including half & half and powdered creamer), Plain Jell-O Only (No red color; diabetics please choose no sugar options), Clear Sports drink (No red color; diabetics please choose diet or no sugar options), and Plain Popsicles Only (No red color; diabetics please choose no sugar options)    Take these medicines the morning of surgery with A SIP OF WATER : Certrizine Diltiazem Isosorbide Metoprolol Pantoprazole and Finerenane   Last dose of Eliquis will be on 01/16/2023      Do not wear jewelry, make-up or nail polish, including gel polish,  artificial nails, or any other type of covering on natural nails (fingers and  toes).  Do not wear lotions, powders, or perfumes, or deodorant.  Do not shave 48 hours prior to surgery.  Men may shave face and neck.  Do not bring valuables to the hospital.  [facility] is not responsible for any belongings or valuables.  Contacts, dentures or bridgework may not be worn into surgery.  Leave your suitcase in the car.  After surgery it may be  brought to your room.  For patients admitted to the hospital, discharge time will be determined by your treatment team.  Patients discharged the day of surgery will not be allowed to drive home.   Name and phone number of your driver:   Family Special instructions:  N/A  Please read over the following fact sheets that you were given. Care and Recovery After Surgery   Upper Endoscopy, Adult Upper endoscopy is a procedure to look inside the upper GI (gastrointestinal) tract. The upper GI tract is made up of: The esophagus. This is the part of the body that moves food from your mouth to your stomach. The stomach. The duodenum. This is the first part of your small intestine. This procedure is also called esophagogastroduodenoscopy (EGD) or gastroscopy. In this procedure, your health care provider passes a thin, flexible tube (endoscope) through your mouth and down your esophagus into your stomach and into your duodenum. A small camera is attached to the end of the tube. Images from the camera appear on a monitor in the exam room. During this procedure, your health care provider may also remove a small piece of tissue to be sent to a lab and examined under a microscope (biopsy). Your health care provider may do an upper endoscopy to diagnose cancers of the upper GI tract. You may also have this procedure to find  the cause of other conditions, such as: Stomach pain. Heartburn. Pain or problems when swallowing. Nausea and vomiting. Stomach bleeding. Stomach ulcers. Tell a health care provider about: Any allergies you have. All medicines you are taking, including vitamins, herbs, eye drops, creams, and over-the-counter medicines. Any problems you or family members have had with anesthetic medicines. Any bleeding problems you have. Any surgeries you have had. Any medical conditions you have. Whether you are pregnant or may be pregnant. What are the risks? Your healthcare provider will talk  with you about risks. These may include: Infection. Bleeding. Allergic reactions to medicines. A tear or hole (perforation) in the esophagus, stomach, or duodenum. What happens before the procedure? When to stop eating and drinking Follow instructions from your health care provider about what you may eat and drink. These may include: 8 hours before your procedure Stop eating most foods. Do not eat meat, fried foods, or fatty foods. Eat only light foods, such as toast or crackers. All liquids are okay except energy drinks and alcohol. 6 hours before your procedure Stop eating. Drink only clear liquids, such as water, clear fruit juice, black coffee, plain tea, and sports drinks. Do not drink energy drinks or alcohol. 2 hours before your procedure Stop drinking all liquids. You may be allowed to take medicines with small sips of water. If you do not follow your health care provider's instructions, your procedure may be delayed or canceled. Medicines Ask your health care provider about: Changing or stopping your regular medicines. This is especially important if you are taking diabetes medicines or blood thinners. Taking medicines such as aspirin and ibuprofen. These medicines can thin your blood. Do not take these medicines unless your health care provider tells you to take them. Taking over-the-counter medicines, vitamins, herbs, and supplements. General instructions If you will be going home right after the procedure, plan to have a responsible adult: Take you home from the hospital or clinic. You will not be allowed to drive. Care for you for the time you are told. What happens during the procedure?  An IV will be inserted into one of your veins. You may be given one or more of the following: A medicine to help you relax (sedative). A medicine to numb the throat (local anesthetic). You will lie on your left side on an exam table. Your health care provider will pass the endoscope  through your mouth and down your esophagus. Your health care provider will use the scope to check the inside of your esophagus, stomach, and duodenum. Biopsies may be taken. The endoscope will be removed. The procedure may vary among health care providers and hospitals. What happens after the procedure? Your blood pressure, heart rate, breathing rate, and blood oxygen level will be monitored until you leave the hospital or clinic. When your throat is no longer numb, you may be given some fluids to drink. If you were given a sedative during the procedure, it can affect you for several hours. Do not drive or operate machinery until your health care provider says that it is safe. It is up to you to get the results of your procedure. Ask your health care provider, or the department that is doing the procedure, when your results will be ready. Contact a health care provider if you: Have a sore throat that lasts longer than 1 day. Have a fever. Get help right away if you: Vomit blood or your vomit looks like coffee grounds. Have bloody, black, or tarry stools.  Have a very bad sore throat or you cannot swallow. Have difficulty breathing or very bad pain in your chest or abdomen. These symptoms may be an emergency. Get help right away. Call 911. Do not wait to see if the symptoms will go away. Do not drive yourself to the hospital. Summary Upper endoscopy is a procedure to look inside the upper GI tract. During the procedure, an IV will be inserted into one of your veins. You may be given a medicine to help you relax. The endoscope will be passed through your mouth and down your esophagus. Follow instructions from your health care provider about what you can eat and drink. This information is not intended to replace advice given to you by your health care provider. Make sure you discuss any questions you have with your health care provider. Document Revised: 06/25/2021 Document Reviewed:  06/25/2021 Elsevier Patient Education  2024 [employer]  Monitored Anesthesia Care Anesthesia refers to the techniques, procedures, and medicines that help a person stay safe and comfortable during surgery. Monitored anesthesia care, or sedation, is one type of anesthesia. You may have sedation if you do not need to be asleep for your procedure. Procedures that use sedation may include: Surgery to remove cataracts from your eyes. A dental procedure. A biopsy. This is when a tissue sample is removed and looked at under a microscope. You will be watched closely during your procedure. Your level of sedation or type of anesthesia may be changed to fit your needs. Tell a health care provider about: Any allergies you have. All medicines you are taking, including vitamins, herbs, eye drops, creams, and over-the-counter medicines. Any problems you or family members have had with anesthesia. Any bleeding problems you have. Any surgeries you have had. Any medical conditions or illnesses you have. This includes sleep apnea, cough, fever, or the flu. Whether you are pregnant or may be pregnant. Whether you use cigarettes, alcohol, or drugs. Any use of steroids, whether by mouth or as a cream. What are the risks? Your health care provider will talk with you about risks. These may include: Getting too much medicine (oversedation). Nausea. Allergic reactions to medicines. Trouble breathing. If this happens, a breathing tube may be used to help you breathe. It will be removed when you are awake and breathing on your own. Heart trouble. Lung trouble. Confusion that gets better with time (emergence delirium). What happens before the procedure? When to stop eating and drinking Follow instructions from your health care provider about what you may eat and drink. These may include: 8 hours before your procedure Stop eating most foods. Do not eat meat, fried foods, or fatty foods. Eat only light foods,  such as toast or crackers. All liquids are okay except energy drinks and alcohol. 6 hours before your procedure Stop eating. Drink only clear liquids, such as water, clear fruit juice, black coffee, plain tea, and sports drinks. Do not drink energy drinks or alcohol. 2 hours before your procedure Stop drinking all liquids. You may be allowed to take medicines with small sips of water. If you do not follow your health care provider's instructions, your procedure may be delayed or canceled. Medicines Ask your health care provider about: Changing or stopping your regular medicines. These include any diabetes medicines or blood thinners you take. Taking medicines such as aspirin and ibuprofen. These medicines can thin your blood. Do not take them unless your health care provider tells you to. Taking over-the-counter medicines, vitamins, herbs, and  supplements. Testing You may have an exam or testing. You may have a blood or urine sample taken. General instructions Do not use any products that contain nicotine or tobacco for at least 4 weeks before the procedure. These products include cigarettes, chewing tobacco, and vaping devices, such as e-cigarettes. If you need help quitting, ask your health care provider. If you will be going home right after the procedure, plan to have a responsible adult: Take you home from the hospital or clinic. You will not be allowed to drive. Care for you for the time you are told. What happens during the procedure?  Your blood pressure, heart rate, breathing, level of pain, and blood oxygen level will be monitored. An IV will be inserted into one of your veins. You may be given: A sedative. This helps you relax. Anesthesia. This will: Numb certain areas of your body. Make you fall asleep for surgery. You will be given medicines as needed to keep you comfortable. The more medicine you are given, the deeper your level of sedation will be. Your level of  sedation may be changed to fit your needs. There are three levels of sedation: Mild sedation. At this level, you may feel awake and relaxed. You will be able to follow directions. Moderate sedation. At this level, you will be sleepy. You may not remember the procedure. Deep sedation. At this level, you will be asleep. You will not remember the procedure. How you get the medicines will depend on your age and the procedure. They may be given as: A pill. This may be taken by mouth (orally) or inserted into the rectum. An injection. This may be into a vein or muscle. A spray through the nose. After your procedure is over, the medicine will be stopped. The procedure may vary among health care providers and hospitals. What happens after the procedure? Your blood pressure, heart rate, breathing rate, and blood oxygen level will be monitored until you leave the hospital or clinic. You may feel sleepy, clumsy, or nauseous. You may not remember what happened during or after the procedure. Sedation can affect you for several hours. Do not drive or use machinery until your health care provider says that it is safe. This information is not intended to replace advice given to you by your health care provider. Make sure you discuss any questions you have with your health care provider. Document Revised: 08/11/2021 Document Reviewed: 08/11/2021 Elsevier Patient Education  2024 [employer].

## 2023-01-17 DIAGNOSIS — N184 Chronic kidney disease, stage 4 (severe): Secondary | ICD-10-CM | POA: Diagnosis not present

## 2023-01-17 DIAGNOSIS — I5032 Chronic diastolic (congestive) heart failure: Secondary | ICD-10-CM | POA: Diagnosis not present

## 2023-01-18 ENCOUNTER — Other Ambulatory Visit: Payer: Self-pay | Admitting: "Endocrinology

## 2023-01-19 ENCOUNTER — Ambulatory Visit (HOSPITAL_COMMUNITY): Payer: Medicare Other | Admitting: Anesthesiology

## 2023-01-19 ENCOUNTER — Telehealth (INDEPENDENT_AMBULATORY_CARE_PROVIDER_SITE_OTHER): Payer: Self-pay | Admitting: *Deleted

## 2023-01-19 ENCOUNTER — Ambulatory Visit (HOSPITAL_COMMUNITY)
Admission: RE | Admit: 2023-01-19 | Discharge: 2023-01-19 | Disposition: A | Discharge status: Home / Self Care | Payer: Medicare Other | Attending: Gastroenterology | Admitting: Gastroenterology

## 2023-01-19 ENCOUNTER — Encounter (HOSPITAL_COMMUNITY)
Admission: RE | Disposition: A | Discharge status: Home / Self Care | Payer: Self-pay | Source: Home / Self Care | Attending: Gastroenterology

## 2023-01-19 ENCOUNTER — Other Ambulatory Visit (INDEPENDENT_AMBULATORY_CARE_PROVIDER_SITE_OTHER): Payer: Self-pay | Admitting: *Deleted

## 2023-01-19 ENCOUNTER — Encounter (HOSPITAL_COMMUNITY): Payer: Self-pay | Admitting: Gastroenterology

## 2023-01-19 DIAGNOSIS — I2510 Atherosclerotic heart disease of native coronary artery without angina pectoris: Secondary | ICD-10-CM | POA: Diagnosis not present

## 2023-01-19 DIAGNOSIS — D649 Anemia, unspecified: Secondary | ICD-10-CM | POA: Diagnosis not present

## 2023-01-19 DIAGNOSIS — D509 Iron deficiency anemia, unspecified: Secondary | ICD-10-CM

## 2023-01-19 DIAGNOSIS — D3A090 Benign carcinoid tumor of the bronchus and lung: Secondary | ICD-10-CM | POA: Diagnosis not present

## 2023-01-19 DIAGNOSIS — K922 Gastrointestinal hemorrhage, unspecified: Secondary | ICD-10-CM

## 2023-01-19 DIAGNOSIS — Z79899 Other long term (current) drug therapy: Secondary | ICD-10-CM | POA: Insufficient documentation

## 2023-01-19 DIAGNOSIS — K31811 Angiodysplasia of stomach and duodenum with bleeding: Secondary | ICD-10-CM

## 2023-01-19 DIAGNOSIS — K219 Gastro-esophageal reflux disease without esophagitis: Secondary | ICD-10-CM | POA: Diagnosis not present

## 2023-01-19 DIAGNOSIS — K648 Other hemorrhoids: Secondary | ICD-10-CM | POA: Insufficient documentation

## 2023-01-19 DIAGNOSIS — N183 Chronic kidney disease, stage 3 unspecified: Secondary | ICD-10-CM | POA: Diagnosis not present

## 2023-01-19 DIAGNOSIS — Z87891 Personal history of nicotine dependence: Secondary | ICD-10-CM | POA: Insufficient documentation

## 2023-01-19 DIAGNOSIS — I4891 Unspecified atrial fibrillation: Secondary | ICD-10-CM | POA: Diagnosis not present

## 2023-01-19 DIAGNOSIS — E1122 Type 2 diabetes mellitus with diabetic chronic kidney disease: Secondary | ICD-10-CM | POA: Diagnosis not present

## 2023-01-19 DIAGNOSIS — Z6841 Body Mass Index (BMI) 40.0 and over, adult: Secondary | ICD-10-CM | POA: Diagnosis not present

## 2023-01-19 DIAGNOSIS — G4733 Obstructive sleep apnea (adult) (pediatric): Secondary | ICD-10-CM | POA: Diagnosis not present

## 2023-01-19 DIAGNOSIS — I5032 Chronic diastolic (congestive) heart failure: Secondary | ICD-10-CM | POA: Diagnosis not present

## 2023-01-19 DIAGNOSIS — Z7901 Long term (current) use of anticoagulants: Secondary | ICD-10-CM | POA: Diagnosis not present

## 2023-01-19 DIAGNOSIS — I130 Hypertensive heart and chronic kidney disease with heart failure and stage 1 through stage 4 chronic kidney disease, or unspecified chronic kidney disease: Secondary | ICD-10-CM | POA: Diagnosis not present

## 2023-01-19 DIAGNOSIS — J449 Chronic obstructive pulmonary disease, unspecified: Secondary | ICD-10-CM | POA: Diagnosis not present

## 2023-01-19 HISTORY — PX: HOT HEMOSTASIS: SHX5433

## 2023-01-19 HISTORY — PX: ESOPHAGOGASTRODUODENOSCOPY (EGD) WITH PROPOFOL: SHX5813

## 2023-01-19 SURGERY — ESOPHAGOGASTRODUODENOSCOPY (EGD) WITH PROPOFOL
Anesthesia: General

## 2023-01-19 MED ORDER — PANTOPRAZOLE SODIUM 40 MG PO TBEC
40.0000 mg | DELAYED_RELEASE_TABLET | Freq: Two times a day (BID) | ORAL | 0 refills | Status: DC
Start: 2023-01-19 — End: 2023-01-19

## 2023-01-19 MED ORDER — PROPOFOL 10 MG/ML IV BOLUS
INTRAVENOUS | Status: DC | PRN
  Administered 2023-01-19: 80 mg via INTRAVENOUS
  Administered 2023-01-19: 150 ug/kg/min via INTRAVENOUS

## 2023-01-19 MED ORDER — LACTATED RINGERS IV SOLN: INTRAVENOUS | Status: DC | PRN

## 2023-01-19 MED ORDER — LIDOCAINE HCL (CARDIAC) PF 100 MG/5ML IV SOSY
PREFILLED_SYRINGE | INTRAVENOUS | Status: DC | PRN
  Administered 2023-01-19: 60 mg via INTRAVENOUS

## 2023-01-19 MED ORDER — PANTOPRAZOLE SODIUM 40 MG PO TBEC: DELAYED_RELEASE_TABLET | ORAL | 5 refills | Status: DC

## 2023-01-19 MED ORDER — PANTOPRAZOLE SODIUM 40 MG PO TBEC: DELAYED_RELEASE_TABLET | ORAL | 0 refills | Status: DC

## 2023-01-19 NOTE — Telephone Encounter (Signed)
Fleet Contras at pharmacy notified and two new scripts sent in.

## 2023-01-19 NOTE — Telephone Encounter (Signed)
Hi, she needs to take 40 mg twice a day for the next 30 days, then go down to once a day dosing.

## 2023-01-19 NOTE — Discharge Instructions (Signed)
You are being discharged to home.  Resume your previous diet.  Your physician has recommended a repeat upper endoscopy in two months for surveillance.  Take pantoprazole 40 mg twice a day for one month, then decrease to once a day. Restart Eliquis tomorrow.

## 2023-01-19 NOTE — Transfer of Care (Signed)
Immediate Anesthesia Transfer of Care Note  Patient: [name]  Procedure(s) Performed: ESOPHAGOGASTRODUODENOSCOPY (EGD) WITH PROPOFOL HOT HEMOSTASIS (ARGON PLASMA COAGULATION/BICAP)  Patient Location: Short Stay  Anesthesia Type:General  Level of Consciousness: drowsy  Airway & Oxygen Therapy: Patient Spontanous Breathing  Post-op Assessment: Report given to RN and Post -op Vital signs reviewed and stable  Post vital signs: Reviewed and stable  Last Vitals:  Vitals Value Taken Time  BP 112/58 01/19/23 1531  Temp 36.9 C 01/19/23 1531  Pulse 73 01/19/23 1531  Resp 17 01/19/23 1531  SpO2 100 % 01/19/23 1531    Last Pain:  Vitals:   01/19/23 1531  TempSrc: Oral  PainSc: 0-No pain      Patients Stated Pain Goal: 4 (01/19/23 1239)  Complications: No notable events documented.

## 2023-01-19 NOTE — Interval H&P Note (Signed)
History and Physical Interval Note:  01/19/2023 12:48 PM  [name]  has presented today for surgery, with the diagnosis of IDA,GAVE.  The various methods of treatment have been discussed with the patient and family. After consideration of risks, benefits and other options for treatment, the patient has consented to  Procedure(s) with comments: ESOPHAGOGASTRODUODENOSCOPY (EGD) WITH PROPOFOL (N/A) - 2:30 pm, asa 3 as a surgical intervention.  The patient's history has been reviewed, patient examined, no change in status, stable for surgery.  I have reviewed the patient's chart and labs.  Questions were answered to the patient's satisfaction.     Katrinka Blazing Mayorga

## 2023-01-19 NOTE — Anesthesia Preprocedure Evaluation (Addendum)
Anesthesia Evaluation  Patient identified by MRN, date of birth, ID band Patient awake    Reviewed: Allergy & Precautions, H&P , NPO status , Patient's Chart, lab work & pertinent test results, reviewed documented beta blocker date and time   Airway Mallampati: II  TM Distance: >3 FB Neck ROM: full    Dental no notable dental hx. (+) Dental Advisory Given, Teeth Intact   Pulmonary asthma , sleep apnea , COPD, former smoker   Pulmonary exam normal breath sounds clear to auscultation       Cardiovascular Exercise Tolerance: Good hypertension, + CAD and +CHF  Normal cardiovascular exam+ dysrhythmias Atrial Fibrillation  Rhythm:regular Rate:Normal  Diastolic dysfunction   Neuro/Psych negative neurological ROS  negative psych ROS   GI/Hepatic Neg liver ROS,GERD  ,,  Endo/Other  diabetes  Morbid obesity  Renal/GU CRF and ARFRenal diseaseStage 3 CKD  negative genitourinary   Musculoskeletal   Abdominal   Peds  Hematology  (+) Blood dyscrasia, anemia   Anesthesia Other Findings Carcinoid tumor of the lung  Reproductive/Obstetrics negative OB ROS                             Anesthesia Physical Anesthesia Plan  ASA: 4  Anesthesia Plan: General   Post-op Pain Management: Minimal or no pain anticipated   Induction: Intravenous  PONV Risk Score and Plan: Propofol infusion  Airway Management Planned: Nasal Cannula and Natural Airway  Additional Equipment: None  Intra-op Plan:   Post-operative Plan:   Informed Consent: I have reviewed the patients History and Physical, chart, labs and discussed the procedure including the risks, benefits and alternatives for the proposed anesthesia with the patient or authorized representative who has indicated his/her understanding and acceptance.     Dental Advisory Given  Plan Discussed with: CRNA  Anesthesia Plan Comments:          Anesthesia Quick Evaluation

## 2023-01-19 NOTE — Anesthesia Postprocedure Evaluation (Signed)
Anesthesia Post Note  Patient: [name]  Procedure(s) Performed: ESOPHAGOGASTRODUODENOSCOPY (EGD) WITH PROPOFOL HOT HEMOSTASIS (ARGON PLASMA COAGULATION/BICAP)  Patient location during evaluation: PACU Anesthesia Type: General Level of consciousness: awake and alert Pain management: pain level controlled Vital Signs Assessment: post-procedure vital signs reviewed and stable Respiratory status: spontaneous breathing, nonlabored ventilation, respiratory function stable and patient connected to nasal cannula oxygen Cardiovascular status: blood pressure returned to baseline and stable Postop Assessment: no apparent nausea or vomiting Anesthetic complications: no   There were no known notable events for this encounter.   Last Vitals:  Vitals:   01/19/23 1246 01/19/23 1531  BP: (!) 112/58 (!) 112/58  Pulse: 65 73  Resp: 16 17  Temp: 36.6 C 36.9 C  SpO2: 99% 100%    Last Pain:  Vitals:   01/19/23 1531  TempSrc: Oral  PainSc: 0-No pain                 Gagandeep Kossman L Davianna Deutschman

## 2023-01-19 NOTE — Telephone Encounter (Signed)
Fleet Contras from [facility] calling to clarify order sent in today for pantoproazole. Has two different directions. Please clarify -   Take 1 tablet (40 mg total) by mouth 2 (two) times daily. Take one po 30 minutes prior to breakfast daily. Dispense: 60 tablet, Refills: 0 ordered

## 2023-01-19 NOTE — Op Note (Signed)
[facility] Patient Name: [name] Procedure Date: 01/19/2023 3:03 PM MRN: [id number] Date of Birth: Apr 22, 1955 Attending MD: Katrinka Blazing , , [id number] CSN: [id number] Age: 67 Admit Type: Outpatient Procedure:                Upper GI endoscopy Indications:              Watermelon stomach (GAVE syndrome) Providers:                Katrinka Blazing, Angelica Ran, Elinor Parkinson,                            Kristine L. Jessee Avers, Technician Referring MD:              Medicines:                Monitored Anesthesia Care Complications:            No immediate complications. Estimated Blood Loss:     Estimated blood loss: none. Procedure:                Pre-Anesthesia Assessment:                           - Prior to the procedure, a History and Physical                            was performed, and patient medications, allergies                            and sensitivities were reviewed. The patient's                            tolerance of previous anesthesia was reviewed.                           - The risks and benefits of the procedure and the                            sedation options and risks were discussed with the                            patient. All questions were answered and informed                            consent was obtained.                           - ASA Grade Assessment: III - A patient with severe                            systemic disease.                           After obtaining informed consent, the endoscope was                            passed under direct vision. Throughout  the                            procedure, the patient's blood pressure, pulse, and                            oxygen saturations were monitored continuously. The                            GIF-H190 ([id number]) scope was introduced through the                            mouth, and advanced to the second part of duodenum.                            The upper GI endoscopy  was accomplished without                            difficulty. The patient tolerated the procedure                            well. Scope In: 3:14:40 PM Scope Out: 3:25:20 PM Total Procedure Duration: 0 hours 10 minutes 40 seconds  Findings:      The examined esophagus was normal.      A single small angiodysplastic lesion with no bleeding was found on the       posterior wall of the gastric body. Coagulation for bleeding prevention       using argon plasma at 0.3 liters/minute and 20 watts was successful.      Localized gastric antral vascular ectasia with bleeding was present in       the gastric antrum. There was a medium size clot floating.. Coagulation       for hemostasis using argon plasma at 0.3 liters/minute and 20 watts was       successful.      The examined duodenum was normal. Impression:               - Normal esophagus.                           - A single non-bleeding angiodysplastic lesion in                            the stomach. Treated with argon plasma coagulation                            (APC).                           - Gastric antral vascular ectasia with bleeding.                            Treated with argon plasma coagulation (APC).                           - Normal examined duodenum.                           -  No specimens collected. Moderate Sedation:      Per Anesthesia Care Recommendation:           - Discharge patient to home (ambulatory).                           - Resume previous diet.                           - Repeat upper endoscopy in 2 months for                            surveillance with Dr. Jena Gauss.                           - Take pantoprazole 40 mg twice a day for one                            month, then decrease to once a day.                           - Restart Eliquis tomorrow. Procedure Code(s):        --- Professional ---                           [phone], Esophagogastroduodenoscopy, flexible,                             transoral; with ablation of tumor(s), polyp(s), or                            other lesion(s) (includes pre- and post-dilation                            and guide wire passage, when performed)                           43255, 59, Esophagogastroduodenoscopy, flexible,                            transoral; with control of bleeding, any method Diagnosis Code(s):        --- Professional ---                           U04.540, Angiodysplasia of stomach and duodenum                            with bleeding CPT copyright 2022 American Medical Association. All rights reserved. The codes documented in this report are preliminary and upon coder review may  be revised to meet current compliance requirements. Katrinka Blazing, MD Katrinka Blazing,  01/19/2023 3:35:14 PM This report has been signed electronically. Number of Addenda: 0

## 2023-01-21 ENCOUNTER — Other Ambulatory Visit: Payer: Self-pay | Admitting: "Endocrinology

## 2023-01-21 DIAGNOSIS — N184 Chronic kidney disease, stage 4 (severe): Secondary | ICD-10-CM

## 2023-01-26 ENCOUNTER — Encounter (HOSPITAL_COMMUNITY): Payer: Self-pay | Admitting: Gastroenterology

## 2023-01-27 ENCOUNTER — Inpatient Hospital Stay: Payer: Medicare Other | Attending: Internal Medicine

## 2023-01-27 ENCOUNTER — Other Ambulatory Visit: Payer: Self-pay

## 2023-01-27 ENCOUNTER — Inpatient Hospital Stay (HOSPITAL_BASED_OUTPATIENT_CLINIC_OR_DEPARTMENT_OTHER): Payer: Medicare Other | Admitting: Hematology

## 2023-01-27 VITALS — BP 99/76 | HR 96 | Temp 97.5°F | Resp 16 | Wt 242.0 lb

## 2023-01-27 DIAGNOSIS — Z992 Dependence on renal dialysis: Secondary | ICD-10-CM | POA: Insufficient documentation

## 2023-01-27 DIAGNOSIS — N186 End stage renal disease: Secondary | ICD-10-CM | POA: Diagnosis not present

## 2023-01-27 DIAGNOSIS — D3A090 Benign carcinoid tumor of the bronchus and lung: Secondary | ICD-10-CM

## 2023-01-27 DIAGNOSIS — D649 Anemia, unspecified: Secondary | ICD-10-CM | POA: Diagnosis not present

## 2023-01-27 DIAGNOSIS — D509 Iron deficiency anemia, unspecified: Secondary | ICD-10-CM

## 2023-01-27 DIAGNOSIS — C7A090 Malignant carcinoid tumor of the bronchus and lung: Secondary | ICD-10-CM | POA: Insufficient documentation

## 2023-01-27 LAB — CBC WITH DIFFERENTIAL (CANCER CENTER ONLY)
Abs Immature Granulocytes: 0.04 10*3/uL (ref 0.00–0.07)
Basophils Absolute: 0.1 10*3/uL (ref 0.0–0.1)
Basophils Relative: 1 %
Eosinophils Absolute: 0.3 10*3/uL (ref 0.0–0.5)
Eosinophils Relative: 3 %
HCT: 27.2 % — ABNORMAL LOW (ref 36.0–46.0)
Hemoglobin: 8.3 g/dL — ABNORMAL LOW (ref 12.0–15.0)
Immature Granulocytes: 0 %
Lymphocytes Relative: 12 %
Lymphs Abs: 1.1 10*3/uL (ref 0.7–4.0)
MCH: 27.4 pg (ref 26.0–34.0)
MCHC: 30.5 g/dL (ref 30.0–36.0)
MCV: 89.8 fL (ref 80.0–100.0)
Monocytes Absolute: 0.9 10*3/uL (ref 0.1–1.0)
Monocytes Relative: 9 %
Neutro Abs: 6.8 10*3/uL (ref 1.7–7.7)
Neutrophils Relative %: 75 %
Platelet Count: 272 10*3/uL (ref 150–400)
RBC: 3.03 MIL/uL — ABNORMAL LOW (ref 3.87–5.11)
RDW: 15.6 % — ABNORMAL HIGH (ref 11.5–15.5)
WBC Count: 9.2 10*3/uL (ref 4.0–10.5)
nRBC: 0 % (ref 0.0–0.2)

## 2023-01-27 LAB — CMP (CANCER CENTER ONLY)
ALT: 5 U/L (ref 0–44)
AST: 9 U/L — ABNORMAL LOW (ref 15–41)
Albumin: 3.8 g/dL (ref 3.5–5.0)
Alkaline Phosphatase: 70 U/L (ref 38–126)
Anion gap: 8 (ref 5–15)
BUN: 49 mg/dL — ABNORMAL HIGH (ref 8–23)
CO2: 31 mmol/L (ref 22–32)
Calcium: 9.3 mg/dL (ref 8.9–10.3)
Chloride: 93 mmol/L — ABNORMAL LOW (ref 98–111)
Creatinine: 2.39 mg/dL — ABNORMAL HIGH (ref 0.44–1.00)
GFR, Estimated: 22 mL/min — ABNORMAL LOW (ref >60)
Glucose, Bld: 155 mg/dL — ABNORMAL HIGH (ref 70–99)
Potassium: 4 mmol/L (ref 3.5–5.1)
Sodium: 132 mmol/L — ABNORMAL LOW (ref 135–145)
Total Bilirubin: 0.4 mg/dL (ref 0.3–1.2)
Total Protein: 7.4 g/dL (ref 6.5–8.1)

## 2023-01-27 LAB — IRON AND IRON BINDING CAPACITY (CC-WL,HP ONLY)
Iron: 115 ug/dL (ref 28–170)
Saturation Ratios: 29 % (ref 10.4–31.8)
TIBC: 398 ug/dL (ref 250–450)
UIBC: 283 ug/dL (ref 148–442)

## 2023-01-27 LAB — VITAMIN B12: Vitamin B-12: 466 pg/mL (ref 180–914)

## 2023-01-27 LAB — FERRITIN: Ferritin: 17 ng/mL (ref 11–307)

## 2023-01-27 NOTE — Progress Notes (Signed)
HEMATOLOGY/ONCOLOGY CLINIC NOTE  Date of Service: 01/27/23   Patient Care Team: Benetta Spar, MD as PCP - General (Internal Medicine) Jonelle Sidle, MD as PCP - Cardiology (Cardiology) West Bali, MD (Inactive) (Gastroenterology) Jena Gauss Gerrit Friends, MD as Consulting Physician (Gastroenterology)  [facility] [phone] Legal Guardian DHSS  CHIEF COMPLAINTS/PURPOSE OF CONSULTATION:  Follow-up for bronchopulmonary carcinoid tumor  HISTORY OF PRESENTING ILLNESS:  Mrs. Horsey is a 67 y.o. F with mental disability in LTC, asthma well-controlled, CKD III Baseline Cr 1.3-1.6, HTN, and DM who presented with severe dyspnea requiring BiPAP by EMS.  On arrival in the ER was in V. tach, cardiology consulted and given amiodarone bolus, then developed unstable wide-complex tachycardia and acute respiratory failure.  Creatinine 4.3, potassium 7.5 on admission, pH 7.15, PCO2 45, lactic acid 3.2, chest x-ray pneumonia.  Temporizing potassium measures given, broad-spectrum antibiotics started, intubated and transferred to ICU at [facility]. Extubated 2/28 after 6 days.   The patient was recently admitted to the hospital earlier in February 2020 for community-acquired pneumonia.  During her hospitalization, the patient had a CT of the chest performed on 05/13/2018 which showed a masslike consolidation measuring 5 x 8.2 cm over the left lower lobe.  It was recommended that she have a follow-up CT scan performed within the next month as an outpatient.  During this current hospitalization, it was recommended that she undergo TEE and bronchoscopy.  This was performed on 05/26/2018.  Biopsy results showed typical carcinoid tumor and atypical squamous metaplasia, favor reactive.     When seen today, the patient had just finished dialysis.  She reports having fatigue and a cough.  She states that she has occasional blood-tinged sputum when she coughs.  She denies fevers and chills.  Denies  chest pain and shortness of breath.  Denies abdominal discomfort, nausea, vomiting, constipation, diarrhea.  She reports intermittent headaches. Oncology was consulted for recommendations regarding her carcinoid tumor.   Of note, the patient reside in an assisted living facility in [place], [place].  She tells me that she has a son who visits her regularly but he is not her power of attorney.  She has a guardian Lennox Grumbles) who oversees her care.  Telephone numbers are on the chart.  INTERVAL HISTORY:   NEENA BEECHAM is a 67 y.o. female here for continued evaluation and management of her bronchopulmonary carcinoid tumor. Patient was last seen by me on 07/31/2022 and complained of a stable productive cough with thick white phlegm which she attributed to seasonal allergies.   Patient presents in a wheelchair and is accompanied by her legal guardian during today's visit. Patient was admitted on 01/19/2023 for gastrointestinal hemorrhage and had an esophagogastroduodenoscopy with propofol. She reports that there were concerns of stomach bleeding and black stools at that time. Patient did not receive any IV iron while she was in the hospital or otherwise. Patient denies any black stools or blood in the stools since her endoscopy. Patient does report some abdominal soreness at this time. Patient continues to be on Eliquis at this time.   She reports that her gastroenterologist is Dr. Levon Hedger and she will see her next in 2 months for repeat endoscopy.   She reports that she previously received Aranesp injections at [facility]. Patient does not believe that she has received Aranesp recently. Her nephrologist is Dr. Wolfgang Phoenix.   She complains of intermittent cough once in a while. Patient notes a history of COPD.  She has previously been seen by a pulmonologist and radiation doctor.   MEDICAL HISTORY:  Past Medical History:  Diagnosis Date   Arthritis    Asthma    Atrial fibrillation (HCC)     Carcinoid tumor determined by biopsy of lung 05/2018   Left lung   Chronic diastolic CHF (congestive heart failure) (HCC)    CKD (chronic kidney disease), stage III (HCC)    Diabetes mellitus type 2 in obese    Essential hypertension    GERD (gastroesophageal reflux disease)    Heart murmur    Iron deficiency anemia 10/16/2010   Mental handicap 10/16/2010   Mild CAD 2013   Morbid obesity (HCC)    OSA (obstructive sleep apnea)     SURGICAL HISTORY: Past Surgical History:  Procedure Laterality Date   ABDOMINAL HYSTERECTOMY     AV FISTULA PLACEMENT Right 06/06/2018   Procedure: ARTERIOVENOUS (AV) FISTULA CREATION;  Surgeon: Cephus Shelling, MD;  Location: [facility] OR;  Service: Vascular;  Laterality: Right;   BIOPSY  10/16/2021   Procedure: BIOPSY;  Surgeon: Corbin Ade, MD;  [facility];  Service: Endoscopy;;   BRONCHIAL DILITATION  12/13/2020   Procedure: BRONCHIAL DILITATION;  Surgeon: Josephine Igo, DO;  Location: [facility] ENDOSCOPY;  Service: Pulmonary;;   CESAREAN SECTION     CHOLECYSTECTOMY     COLONOSCOPY  08/2010   normal TI, sigmoid polyp (adenoma ). Next TCS due  08/2015,   COLONOSCOPY WITH PROPOFOL N/A 07/08/2020   Procedure: COLONOSCOPY WITH PROPOFOL;  Surgeon: Corbin Ade, MD;  Location: [facility];  Service: Endoscopy;  Laterality: N/A;  AM (diabetic and facility patient)   ESOPHAGOGASTRODUODENOSCOPY  08/2010   antral and duodenal erosions s/p bx (chronic gastritis, no h.pylori, no celiac dz ), hiatal hernia   ESOPHAGOGASTRODUODENOSCOPY (EGD) WITH PROPOFOL N/A 10/16/2021   Procedure: ESOPHAGOGASTRODUODENOSCOPY (EGD) WITH PROPOFOL;  Surgeon: Corbin Ade, MD;  [facility];  Service: Endoscopy;  Laterality: N/A;  11:45am   ESOPHAGOGASTRODUODENOSCOPY (EGD) WITH PROPOFOL N/A 12/18/2021   Procedure: ESOPHAGOGASTRODUODENOSCOPY (EGD) WITH PROPOFOL;  Surgeon: Lanelle Bal, DO;  [facility];  Service: Endoscopy;  Laterality: N/A;    ESOPHAGOGASTRODUODENOSCOPY (EGD) WITH PROPOFOL N/A 01/19/2023   Procedure: ESOPHAGOGASTRODUODENOSCOPY (EGD) WITH PROPOFOL;  Surgeon: Dolores Frame, MD;  [facility];  Service: Gastroenterology;  Laterality: N/A;  2:30 pm, asa 3   FISTULA SUPERFICIALIZATION Right 08/25/2018   Procedure: FISTULA SUPERFICIALIZATION RIGHT ARM;  Surgeon: Maeola Harman, MD;  Location[facility] OR;  Service: Cardiovascular;  Laterality: Right;   HOT HEMOSTASIS  12/18/2021   Procedure: HOT HEMOSTASIS (ARGON PLASMA COAGULATION/BICAP);  Surgeon: Lanelle Bal, DO;  [facility];  Service: Endoscopy;;   HOT HEMOSTASIS  01/19/2023   Procedure: HOT HEMOSTASIS (ARGON PLASMA COAGULATION/BICAP);  Surgeon: Marguerita Merles, Reuel Boom, MD;  [facility];  Service: Gastroenterology;;   IR FLUORO GUIDE CV LINE RIGHT  05/31/2018   IR US GUIDE VASC ACCESS RIGHT  05/31/2018   KNEE SURGERY     right knee @ 67 years of age   LEFT AND RIGHT HEART CATHETERIZATION WITH CORONARY ANGIOGRAM N/A 09/21/2011   Procedure: LEFT AND RIGHT HEART CATHETERIZATION WITH CORONARY ANGIOGRAM;  Surgeon: Ricki Rodriguez, MD;  Location: [facility] CATH LAB;  Service: Cardiovascular;  Laterality: N/A;   POLYPECTOMY  07/08/2020   Procedure: POLYPECTOMY INTESTINAL;  Surgeon: Corbin Ade, MD;  [facility];  Service: Endoscopy;;   VIDEO  BRONCHOSCOPY Left 12/13/2020   Procedure: VIDEO BRONCHOSCOPY WITHOUT FLUORO;  Surgeon: Josephine Igo, DO;  Location: [facility];  Service: Pulmonary;  Laterality: Left;  Cryotherapy    SOCIAL HISTORY: Social History   Socioeconomic History   Marital status: Divorced    Spouse name: Not on file   Number of children: 1   Years of education: Not on file   Highest education level: Not on file  Occupational History    Employer: UNEMPLOYED  Tobacco Use   Smoking status: Former    Current packs/day: 0.00    Types: Cigarettes    Start date: 04/11/1996    Quit date:  04/11/1996    Years since quitting: 26.8   Smokeless tobacco: Never   Tobacco comments:    quit a couple year ago  Vaping Use   Vaping status: Never Used  Substance and Sexual Activity   Alcohol use: No   Drug use: No   Sexual activity: Never  Other Topics Concern   Not on file  Social History Narrative   Lives with parents.    Social Determinants of Health   Financial Resource Strain: Not on file  Food Insecurity: No Food Insecurity (12/17/2021)   Hunger Vital Sign    Worried About Running Out of Food in the Last Year: Never true    Ran Out of Food in the Last Year: Never true  Transportation Needs: No Transportation Needs (12/17/2021)   PRAPARE - Administrator, Civil Service (Medical): No    Lack of Transportation (Non-Medical): No  Physical Activity: Not on file  Stress: Not on file  Social Connections: Not on file  Intimate Partner Violence: Not At Risk (12/17/2021)   Humiliation, Afraid, Rape, and Kick questionnaire    Fear of Current or Ex-Partner: No    Emotionally Abused: No    Physically Abused: No    Sexually Abused: No    FAMILY HISTORY: Family History  Problem Relation Age of Onset   Diabetes Mother    Hypertension Mother    Heart failure Mother    Hypertension Father    Atrial fibrillation Father    Hypertension Son    Kidney disease Other        son reportedly has had cyst on kidney and lung s/p surgery at [facility]   Colon cancer Neg Hx     ALLERGIES:  has No Known Allergies.  MEDICATIONS:  Current Outpatient Medications  Medication Sig Dispense Refill   acetaminophen (TYLENOL) 325 MG tablet Take 325 mg by mouth every 12 (twelve) hours as needed for moderate pain.     albuterol (PROVENTIL HFA;VENTOLIN HFA) 108 (90 Base) MCG/ACT inhaler Inhale 2 puffs into the lungs every 4 (four) hours as needed for shortness of breath.     apixaban (ELIQUIS) 5 MG TABS tablet Take 1 tablet (5 mg total) by mouth 2 (two) times daily. 60 tablet 3    calcitRIOL (ROCALTROL) 0.25 MCG capsule Take 0.25 mcg by mouth every Monday, Wednesday, and Friday.     cetirizine (ZYRTEC) 10 MG tablet Take 10 mg by mouth daily.     Cholecalciferol (VITAMIN D3) 50 MCG (2000 UT) TABS TAKE 1 TABLET BY MOUTH ONCE DAILY. 30 tablet 2   diltiazem (CARDIZEM CD) 240 MG 24 hr capsule Take 1 capsule (240 mg total) by mouth daily. 30 capsule 0   ferrous sulfate 325 (65 FE) MG tablet Take 1 tablet (325 mg total) by mouth daily with breakfast. 90 tablet 3  Finerenone (KERENDIA) 10 MG TABS Take 10 mg by mouth daily.     fluticasone (FLONASE) 50 MCG/ACT nasal spray Place 2 sprays into both nostrils daily as needed for allergies.     furosemide (LASIX) 40 MG tablet Take 20 mg by mouth See admin instructions. Take 20 mg twice daily, alternating with 20 mg daily every other day     GERI-TUSSIN DM 10-100 MG/5ML liquid Take by mouth.     glucose blood (EASYMAX TEST) test strip CHECK BLOOD SUGAR 4 TIMES A DAY. (BREAKFAST, LUNCH, SUPPER & BEDTIME) 100 strip 2   HUMULIN R U-500 KWIKPEN 500 UNIT/ML KwikPen INJECT 40 UNITS SUBCUTANEOUSLY AT BREAKFAST, LUNCH & SUPPER; WHEN GLUCOSE IS ABOVE 90 & EATING.(HOLD IF BS BELOW 70: CALL MD IF BS ABOVE 400) 6 mL 0   ipratropium-albuterol (DUONEB) 0.5-2.5 (3) MG/3ML SOLN Take 3 mLs by nebulization every 6 (six) hours as needed (for cough).     isosorbide dinitrate (ISORDIL) 10 MG tablet Take 1 tablet (10 mg total) by mouth 3 (three) times daily. 90 tablet 0   loperamide (IMODIUM) 2 MG capsule Take 2-4 mg by mouth See admin instructions. Take 4 mg by mouth now: then 2 mg after each loose stool as needed, do not exceed 5 doses in 24 hours.     losartan (COZAAR) 25 MG tablet Take 12.5 mg by mouth daily.     metolazone (ZAROXOLYN) 5 MG tablet Take 5 mg by mouth in the morning.     metoprolol tartrate (LOPRESSOR) 50 MG tablet Take 50 mg by mouth 2 (two) times daily.     montelukast (SINGULAIR) 10 MG tablet Take 10 mg by mouth at bedtime.      pantoprazole (PROTONIX) 40 MG tablet Take one bid 60 tablet 0   pantoprazole (PROTONIX) 40 MG tablet Take one daily 30 minutes before breakfast 30 tablet 5   polyethylene glycol powder (GLYCOLAX/MIRALAX) 17 GM/SCOOP powder Take 17 g by mouth daily as needed for mild constipation or moderate constipation.     potassium chloride SA (KLOR-CON M) 20 MEQ tablet Take 20 mEq by mouth daily.     rosuvastatin (CRESTOR) 20 MG tablet TAKE (1) TABLET BY MOUTH ONCE DAILY. (Patient taking differently: Take 10 mg by mouth daily.) 30 tablet 3   TRELEGY ELLIPTA 100-62.5-25 MCG/ACT AEPB Inhale 1 puff into the lungs daily.     triamcinolone cream (KENALOG) 0.5 % Apply 1 application topically every 12 (twelve) hours as needed (applied to bilateral legs for allergic dermatitis).     No current facility-administered medications for this visit.    REVIEW OF SYSTEMS:    10 Point review of Systems was done is negative except as noted above.   PHYSICAL EXAMINATION: ECOG PERFORMANCE STATUS: 3 - Symptomatic, >50% confined to bed  . Vitals:   01/27/23 1230  BP: 99/76  Pulse: 96  Resp: 16  Temp: (!) 97.5 F (36.4 C)  SpO2: 98%     Filed Weights   01/27/23 1230  Weight: 242 lb (109.8 kg)     .Body mass index is 44.99 kg/m.   GENERAL:alert, in no acute distress and comfortable SKIN: no acute rashes, no significant lesions EYES: conjunctiva are pink and non-injected, sclera anicteric OROPHARYNX: MMM, no exudates, no oropharyngeal erythema or ulceration NECK: supple, no JVD LYMPH:  no palpable lymphadenopathy in the cervical, axillary or inguinal regions LUNGS: clear to auscultation b/l with normal respiratory effort HEART: regular rate & rhythm ABDOMEN:  normoactive bowel sounds , non tender,  not distended. Extremity: no pedal edema PSYCH: alert & oriented x 3 with fluent speech NEURO: no focal motor/sensory deficits   LABORATORY DATA:  I have reviewed the data as listed  .    Latest Ref Rng  & Units 01/27/2023   10:43 AM 12/28/2022    9:05 AM 07/31/2022    9:40 AM  CBC  WBC 4.0 - 10.5 K/uL 9.2  8.2  8.4   Hemoglobin 12.0 - 15.0 g/dL 8.3  8.1  9.7   Hematocrit 36.0 - 46.0 % 27.2  27.9  30.2   Platelets 150 - 400 K/uL 272  302  235     .    Latest Ref Rng & Units 01/27/2023   10:43 AM 10/06/2022    8:02 AM 07/31/2022    9:40 AM  CMP  Glucose 70 - 99 mg/dL 712  458  099   BUN 8 - 23 mg/dL 49  53  44   Creatinine 0.44 - 1.00 mg/dL 8.33  8.25  0.53   Sodium 135 - 145 mmol/L 132  136  136   Potassium 3.5 - 5.1 mmol/L 4.0  3.8  3.6   Chloride 98 - 111 mmol/L 93  94  100   CO2 22 - 32 mmol/L 31  23  29    Calcium 8.9 - 10.3 mg/dL 9.3  9.6  9.1   Total Protein 6.5 - 8.1 g/dL 7.4  6.6  7.1   Total Bilirubin 0.3 - 1.2 mg/dL 0.4  0.3  0.4   Alkaline Phos 38 - 126 U/L 70  84  78   AST 15 - 41 U/L 9  12  14    ALT 0 - 44 U/L 5  8  8     . Lab Results  Component Value Date   IRON 115 01/27/2023   TIBC 398 01/27/2023   IRONPCTSAT 29 01/27/2023   (Iron and TIBC)  Lab Results  Component Value Date   FERRITIN 17 01/27/2023      05/26/18 Biopsy:    RADIOGRAPHIC STUDIES: I have personally reviewed the radiological images as listed and agreed with the findings in the report. CT Chest Wo Contrast  Result Date: 01/12/2023 CLINICAL DATA:  Follow-up of pulmonary carcinoid tumor. * Tracking Code: BO * EXAM: CT CHEST WITHOUT CONTRAST TECHNIQUE: Multidetector CT imaging of the chest was performed following the standard protocol without IV contrast. RADIATION DOSE REDUCTION: This exam was performed according to the departmental dose-optimization program which includes automated exposure control, adjustment of the mA and/or kV according to patient size and/or use of iterative reconstruction technique. COMPARISON:  01/19/2022 FINDINGS: Cardiovascular: Bovine arch. Aortic atherosclerosis. Moderate cardiomegaly, without pericardial effusion. Left circumflex and right coronary artery  calcification. No pericardial effusion. Aortic valve calcification. Pulmonary artery enlargement, outflow tract 3.1 cm. Mediastinum/Nodes: No mediastinal or hilar adenopathy, given limitations of unenhanced CT. Lungs/Pleura: No pleural fluid. The previously described anterior left lower lobe endobronchial lesion is difficult to differentiate from surrounding volume loss. Felt to measure similar, 1.5 cm on 71/6. Slight increase in volume loss within the anterior left lower lobe, secondary to endobronchial obstruction. For example, this area measures 2.5 cm in thickness on 94/6 versus maximally 1.9 cm on the prior exam when remeasured at a similar level. Upper Abdomen: Caudate lobe enlargement. Cholecystectomy. Normal imaged portions of the spleen, stomach, pancreas, kidneys, left adrenal gland. Right adrenal nodule measures 3 x 2 cm and 15 HU. Similar to 2.9 x 2.0 cm on the prior exam (  when remeasured). Musculoskeletal: Moderate compression deformity at T10 is unchanged, without ventral canal encroachment. IMPRESSION: 1. The left lower lobe endobronchial lesion is similar at 1.5 cm. Slight increase in anterior left lower lobe volume loss and atelectasis secondary to endobronchial obstruction. 2. No thoracic adenopathy. 3. Pulmonary artery enlargement suggests pulmonary arterial hypertension. 4. Aortic valvular calcifications. Consider echocardiography to evaluate for valvular dysfunction. 5. Coronary artery atherosclerosis. Aortic Atherosclerosis (ICD10-I70.0). 6. Right adrenal adenoma . In the absence of clinically indicated signs/symptoms require(s) no independent follow-up. Electronically Signed   By: Jeronimo Greaves M.D.   On: 01/12/2023 14:52     ASSESSMENT & PLAN:  67 y.o. female with  1.  BronchoPulmonary carcinoid tumor - lung primary.  Causing some left lower lobe airway obstruction with post obstructive atelectasis of anterior left lower lobe.   2.  Anemia: Likely due to her renal disease. No  transfusion is indicated.   She is currently on Aranesp.   3.  Kidney disease:  Management per nephrology.  PLAN:  -Discussed lab results on 01/27/23 in detail with patient. CBC showed WBC of 9.2K, hemoglobin of 8.3, and platelets of 272K. -patient is becoming anemic likely from CKD -Patient's hemoglobin continues to be low at 8.3.  -her anemia is likely from GI bleeding and CKD -no indication for blood transfusions at this time  -iron saturation 29%, ferritin 17 -CMP shows that creatinine increased from 1.81 six months ago to 2.39 currently.  -CT chest scan on 12/31/2022 showed that her left lower lobe lung lesion from bronchopulmonary carcinoid appears stable. No new nodules or enlarged lymph nodes suggestive of disease progression. -Patient has no clinical, lab or radiographic evidence of disease progression at this time. We shall continue active surveillance at this time.   -will plan for repeat scan in 1 year -continue to follow with gastroenterology for evaluation and management of GI bleeding -discussed goal to replace iron levels as a result of her stomach bleeding. Discussed that there may be considerations of IV iron replacement, though IV iron may not be be adequate enough to replace iron.  -recommend continuing Aranesp injections for anemia to boost low RBC caused by CKD -continue to follow up with nephrologist to manage CKD-related anemia -discussed option to transfer her care to Dr. Ellin Saba at [facility] to manage her multifactorial anemia and bronchopulmonary carcinoid. Patient is agreeable with this transfer of care due to location convenience. She shall have her IV iron and Aranesp injections managed at [facility].   FOLLOW UP: Transfer care to Dr. Ellin Saba for continued evaluation and management of her multifactorial anemia and bronchopulmonary carcinoid since the patient lives at a facility within 3 miles of any been hospital.  The total time spent in the appointment  was 30 minutes* .  All of the patient's questions were answered with apparent satisfaction. The patient knows to call the clinic with any problems, questions or concerns.   Wyvonnia Lora MD MS AAHIVMS [facility] Hematology/Oncology Physician [facility]  .*Total Encounter Time as defined by the Centers for Medicare and Medicaid Services includes, in addition to the face-to-face time of a patient visit (documented in the note above) non-face-to-face time: obtaining and reviewing outside history, ordering and reviewing medications, tests or procedures, care coordination (communications with other health care professionals or caregivers) and documentation in the medical record.    I,Mitra Faeizi,acting as a Neurosurgeon for Wyvonnia Lora, MD.,have documented all relevant documentation on the behalf of Wyvonnia Lora, MD,as directed by  Wyvonnia Lora, MD while in the presence of Wyvonnia Lora, MD.  .I have reviewed the above documentation for accuracy and completeness, and I agree with the above. Johney Maine MD

## 2023-02-10 ENCOUNTER — Inpatient Hospital Stay: Payer: Medicare Other | Attending: Oncology | Admitting: Oncology

## 2023-02-10 ENCOUNTER — Other Ambulatory Visit: Payer: Self-pay | Admitting: "Endocrinology

## 2023-02-10 ENCOUNTER — Inpatient Hospital Stay: Payer: Medicare Other

## 2023-02-10 ENCOUNTER — Encounter: Payer: Self-pay | Admitting: Oncology

## 2023-02-10 VITALS — BP 110/74 | HR 71 | Temp 97.2°F | Resp 16 | Ht 62.0 in | Wt 238.6 lb

## 2023-02-10 DIAGNOSIS — D3A090 Benign carcinoid tumor of the bronchus and lung: Secondary | ICD-10-CM

## 2023-02-10 DIAGNOSIS — I4891 Unspecified atrial fibrillation: Secondary | ICD-10-CM | POA: Insufficient documentation

## 2023-02-10 DIAGNOSIS — N185 Chronic kidney disease, stage 5: Secondary | ICD-10-CM | POA: Diagnosis not present

## 2023-02-10 DIAGNOSIS — N183 Chronic kidney disease, stage 3 unspecified: Secondary | ICD-10-CM | POA: Insufficient documentation

## 2023-02-10 DIAGNOSIS — C7A090 Malignant carcinoid tumor of the bronchus and lung: Secondary | ICD-10-CM | POA: Insufficient documentation

## 2023-02-10 DIAGNOSIS — K31811 Angiodysplasia of stomach and duodenum with bleeding: Secondary | ICD-10-CM | POA: Insufficient documentation

## 2023-02-10 DIAGNOSIS — D509 Iron deficiency anemia, unspecified: Secondary | ICD-10-CM

## 2023-02-10 DIAGNOSIS — Z7901 Long term (current) use of anticoagulants: Secondary | ICD-10-CM | POA: Insufficient documentation

## 2023-02-10 DIAGNOSIS — N189 Chronic kidney disease, unspecified: Secondary | ICD-10-CM | POA: Insufficient documentation

## 2023-02-10 MED ORDER — FERROUS SULFATE 325 (65 FE) MG PO TABS: 325.0000 mg | ORAL_TABLET | ORAL | 3 refills | Status: AC

## 2023-02-10 NOTE — Patient Instructions (Signed)
VISIT SUMMARY:  You came in today for a follow-up visit to discuss your ongoing health issues, including anemia, atrial fibrillation, heart failure, and a benign lung tumor. We reviewed your recent lab results and discussed your current symptoms and treatment plan.  YOUR PLAN:  -IRON DEFICIENCY ANEMIA: Iron deficiency anemia is a condition where your body lacks enough iron to produce hemoglobin, which is necessary for carrying oxygen in your blood. This can be caused by blood loss or chronic conditions like kidney disease. We will change your oral iron supplementation to every other day to reduce constipation and schedule you for an IV iron infusion. We will repeat your labs in 6 weeks to see how you are responding to the treatment.  -LUNG CARCINOID: A benign lung tumor is a non-cancerous growth in the lung. There are no current issues with this, so we will continue with your current management and monitoring.  INSTRUCTIONS:  Please follow up in 6 weeks and have your labs done before the visit.

## 2023-02-10 NOTE — Assessment & Plan Note (Signed)
Likely secondary to GI blood loss and chronic kidney disease. Recent endoscopy showed active bleeding. Currently on blood thinners for AFib. Iron deficient on labs. -Change oral iron supplementation to every other day to reduce constipation. -Schedule for IV iron infusion.The most likely cause of her anemia is due to chronic blood loss.We discussed some of the risks, benefits, and alternatives of intravenous iron infusions. The patient is symptomatic from anemia and the iron level is critically low. She tolerated oral iron supplement poorly and desires to achieved higher levels of iron faster for adequate hematopoesis. Some of the side-effects to be expected including risks of infusion reactions, phlebitis, headaches, nausea and fatigue.  The patient is willing to proceed. Patient education material was dispensed. Goal is to keep ferritin level greater than 100 and resolution of anemia -Repeat labs in 6 weeks to assess response.

## 2023-02-10 NOTE — Assessment & Plan Note (Signed)
Patient has a history of CKD following with Dr. Wolfgang Phoenix.  Was previously on EPO shots for anemia. -Continue to follow with Dr. Wolfgang Phoenix -If anemia does not correct with iron replacement, will consider EPO shots

## 2023-02-10 NOTE — Progress Notes (Signed)
[facility] at [facility] HEMATOLOGY NEW VISIT  Benetta Spar, MD  REASON FOR REFERRAL: Anemia  SUMMARY OF HEMATOLOGIC HISTORY:    Latest Ref Rng & Units 01/27/2023   10:43 AM 12/28/2022    9:05 AM 07/31/2022    9:40 AM  CBC  WBC 4.0 - 10.5 K/uL 9.2  8.2  8.4   Hemoglobin 12.0 - 15.0 g/dL 8.3  8.1  9.7   Hematocrit 36.0 - 46.0 % 27.2  27.9  30.2   Platelets 150 - 400 K/uL 272  302  235     Lab Results  Component Value Date   IRON 115 01/27/2023   TIBC 398 01/27/2023   FERRITIN 17 01/27/2023     Latest Reference Range & Units 06/21/08 12:17 09/05/10 17:45 09/18/10 09:45 10/16/10 09:45 12/15/10 09:19 02/16/11 10:26 05/31/18 06:09 01/27/23 10:45  Saturation Ratios 10.4 - 31.8 % NOT CALC Not calculated due to Iron <10. Not calculated due to Iron <10. 38 21 21 11  (L) 14 29  (L): Data is abnormally low  HISTORY OF PRESENT ILLNESS: [name] 67 y.o. female referred for anemia.  Patient is accompanied by her legal guardian today.  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  She was referred to [place] by Dr. Candise Che at [facility] for care closer to home and to get IV iron.  Patient recently had a GI bleed which was diagnosed on an endoscopy that showed gastric antral vascular ectasia with bleeding and was treated with argon plasma coagulation [APC].  Patient also reports being on blood thinners for atrial fibrillation and chronic kidney disease both of which can be contributing to anemia as well.  Patient has no complaints today reports occasional fatigue but overall is doing good.  She stated that she received EPO shots before but was stopped by Dr. Wolfgang Phoenix.  She denies any dark-colored stools or abdominal pain.  Appetite is good and no recent loss of weight.  Patient lives in a nursing home.   I have reviewed the past medical  history, past surgical history, social history and family history with the patient   ALLERGIES:  has No Known Allergies.  MEDICATIONS:  Current Outpatient Medications  Medication Sig Dispense Refill   apixaban (ELIQUIS) 5 MG TABS tablet Take 1 tablet (5 mg total) by mouth 2 (two) times daily. 60 tablet 3   calcitRIOL (ROCALTROL) 0.25 MCG capsule Take 0.25 mcg by mouth every Monday, Wednesday, and Friday.     cetirizine (ZYRTEC) 10 MG tablet Take 10 mg by mouth daily.     Cholecalciferol (VITAMIN D3) 50 MCG (2000 UT) TABS TAKE 1 TABLET BY MOUTH ONCE DAILY. 30 tablet 2   diltiazem (CARDIZEM CD) 240 MG 24 hr capsule Take 1 capsule (240 mg total) by mouth daily. 30 capsule 0   Finerenone (KERENDIA) 10 MG TABS Take 10 mg by mouth daily.     furosemide (LASIX) 40 MG tablet Take 20 mg by mouth See admin instructions. Take 20 mg twice daily, alternating with 20 mg daily every other day     HUMULIN R U-500 KWIKPEN 500 UNIT/ML KwikPen INJECT 40 UNITS SUBCUTANEOUSLY AT BREAKFAST, LUNCH & SUPPER; WHEN GLUCOSE IS ABOVE 90 & EATING.(HOLD IF BS BELOW 70: CALL MD IF BS ABOVE 400) 6 mL 0   isosorbide dinitrate (ISORDIL) 10 MG tablet Take 1 tablet (10 mg total)  by mouth 3 (three) times daily. 90 tablet 0   Lancets 28G MISC CHECK BLOOD SUGAR 4 TIMES A DAY. (BREAKFAST, LUNCH, SUPPER, & BEDTIME) 100 each 0   losartan (COZAAR) 25 MG tablet Take 12.5 mg by mouth daily.     metolazone (ZAROXOLYN) 5 MG tablet Take 5 mg by mouth in the morning.     metoprolol tartrate (LOPRESSOR) 50 MG tablet Take 50 mg by mouth 2 (two) times daily.     montelukast (SINGULAIR) 10 MG tablet Take 10 mg by mouth at bedtime.     pantoprazole (PROTONIX) 40 MG tablet Take one bid 60 tablet 0   pantoprazole (PROTONIX) 40 MG tablet Take one daily 30 minutes before breakfast 30 tablet 5   potassium chloride SA (KLOR-CON M) 20 MEQ tablet Take 20 mEq by mouth daily.     rosuvastatin (CRESTOR) 20 MG tablet TAKE (1) TABLET BY MOUTH ONCE DAILY.  (Patient taking differently: Take 10 mg by mouth daily.) 30 tablet 3   TRELEGY ELLIPTA 100-62.5-25 MCG/ACT AEPB Inhale 1 puff into the lungs daily.     acetaminophen (TYLENOL) 325 MG tablet Take 325 mg by mouth every 12 (twelve) hours as needed for moderate pain. (Patient not taking: Reported on 02/10/2023)     albuterol (PROVENTIL HFA;VENTOLIN HFA) 108 (90 Base) MCG/ACT inhaler Inhale 2 puffs into the lungs every 4 (four) hours as needed for shortness of breath. (Patient not taking: Reported on 02/10/2023)     ferrous sulfate 325 (65 FE) MG tablet Take 1 tablet (325 mg total) by mouth every other day. 90 tablet 3   fluticasone (FLONASE) 50 MCG/ACT nasal spray Place 2 sprays into both nostrils daily as needed for allergies. (Patient not taking: Reported on 02/10/2023)     GERI-TUSSIN DM 10-100 MG/5ML liquid Take by mouth. (Patient not taking: Reported on 02/10/2023)     glucose blood (EASYMAX TEST) test strip CHECK BLOOD SUGAR 4 TIMES A DAY. (BREAKFAST, LUNCH, SUPPER & BEDTIME) 100 strip 2   ipratropium-albuterol (DUONEB) 0.5-2.5 (3) MG/3ML SOLN Take 3 mLs by nebulization every 6 (six) hours as needed (for cough). (Patient not taking: Reported on 02/10/2023)     loperamide (IMODIUM) 2 MG capsule Take 2-4 mg by mouth See admin instructions. Take 4 mg by mouth now: then 2 mg after each loose stool as needed, do not exceed 5 doses in 24 hours. (Patient not taking: Reported on 02/10/2023)     polyethylene glycol powder (GLYCOLAX/MIRALAX) 17 GM/SCOOP powder Take 17 g by mouth daily as needed for mild constipation or moderate constipation. (Patient not taking: Reported on 02/10/2023)     triamcinolone cream (KENALOG) 0.5 % Apply 1 application topically every 12 (twelve) hours as needed (applied to bilateral legs for allergic dermatitis). (Patient not taking: Reported on 02/10/2023)     No current facility-administered medications for this visit.     REVIEW OF SYSTEMS:   Constitutional: Denies fevers,  chills or night sweats Eyes: Denies blurriness of vision Ears, nose, mouth, throat, and face: Denies mucositis or sore throat Respiratory: Denies cough, dyspnea or wheezes Cardiovascular: Denies palpitation, chest discomfort or lower extremity swelling Gastrointestinal:  Denies nausea, heartburn or change in bowel habits Skin: Denies abnormal skin rashes Lymphatics: Denies new lymphadenopathy or easy bruising Neurological:Denies numbness, tingling or new weaknesses Behavioral/Psych: Mood is stable, no new changes  All other systems were reviewed with the patient and are negative.  PHYSICAL EXAMINATION:   Vitals:   02/10/23 1505  BP: 110/74  Pulse: 71  Resp: 16  Temp: (!) 97.2 F (36.2 C)  SpO2: 99%    GENERAL:alert, no distress and comfortable SKIN: skin color, texture, turgor are normal, no rashes or significant lesions LUNGS: clear to auscultation and percussion with normal breathing effort HEART: regular rate & rhythm and no murmurs and no lower extremity edema ABDOMEN:abdomen soft, non-tender and normal bowel sounds Musculoskeletal:no cyanosis of digits and no clubbing  NEURO: alert & oriented x 3 with fluent speech  LABORATORY DATA:  I have reviewed the data as listed  Lab Results  Component Value Date   WBC 9.2 01/27/2023   NEUTROABS 6.8 01/27/2023   HGB 8.3 (L) 01/27/2023   HCT 27.2 (L) 01/27/2023   MCV 89.8 01/27/2023   PLT 272 01/27/2023      Component Value Date/Time   NA 132 (L) 01/27/2023 1043   NA 136 10/06/2022 0802   K 4.0 01/27/2023 1043   CL 93 (L) 01/27/2023 1043   CO2 31 01/27/2023 1043   GLUCOSE 155 (H) 01/27/2023 1043   BUN 49 (H) 01/27/2023 1043   BUN 53 (H) 10/06/2022 0802   CREATININE 2.39 (H) 01/27/2023 1043   CREATININE 1.57 (H) 09/12/2019 0831   CALCIUM 9.3 01/27/2023 1043   CALCIUM 7.8 (L) 05/31/2018 0609   PROT 7.4 01/27/2023 1043   PROT 6.6 10/06/2022 0802   ALBUMIN 3.8 01/27/2023 1043   ALBUMIN 3.9 10/06/2022 0802   AST 9  (L) 01/27/2023 1043   ALT 5 01/27/2023 1043   ALKPHOS 70 01/27/2023 1043   BILITOT 0.4 01/27/2023 1043   GFRNONAA 22 (L) 01/27/2023 1043   GFRNONAA 35 (L) 09/12/2019 0831   GFRAA 38 05/07/2020 0000   GFRAA 40 (L) 09/12/2019 0831       Chemistry      Component Value Date/Time   NA 132 (L) 01/27/2023 1043   NA 136 10/06/2022 0802   K 4.0 01/27/2023 1043   CL 93 (L) 01/27/2023 1043   CO2 31 01/27/2023 1043   BUN 49 (H) 01/27/2023 1043   BUN 53 (H) 10/06/2022 0802   CREATININE 2.39 (H) 01/27/2023 1043   CREATININE 1.57 (H) 09/12/2019 0831   GLU 169 05/07/2020 0000      Component Value Date/Time   CALCIUM 9.3 01/27/2023 1043   CALCIUM 7.8 (L) 05/31/2018 0609   ALKPHOS 70 01/27/2023 1043   AST 9 (L) 01/27/2023 1043   ALT 5 01/27/2023 1043   BILITOT 0.4 01/27/2023 1043     Endoscopy : 01/19/23:  Impression:  - Normal esophagus.  - A single non- bleeding angiodysplastic lesion in the stomach. Treated with argon plasma coagulation ( APC) .  - Gastric antral vascular ectasia with bleeding. Treated with argon plasma coagulation ( APC) .  - Normal examined duodenum.  ASSESSMENT & PLAN:  Patient is a 67 year old female with multiple comorbidities referred for iron deficiency anemia for IV iron infusions   IDA (iron deficiency anemia) Likely secondary to GI blood loss and chronic kidney disease. Recent endoscopy showed active bleeding. Currently on blood thinners for AFib. Iron deficient on labs. -Change oral iron supplementation to every other day to reduce constipation. -Schedule for IV iron infusion.The most likely cause of her anemia is due to chronic blood loss.We discussed some of the risks, benefits, and alternatives of intravenous iron infusions. The patient is symptomatic from anemia and the iron level is critically low. She tolerated oral iron supplement poorly and desires to achieved higher levels of iron faster for adequate hematopoesis. Some of  the side-effects to be  expected including risks of infusion reactions, phlebitis, headaches, nausea and fatigue.  The patient is willing to proceed. Patient education material was dispensed. Goal is to keep ferritin level greater than 100 and resolution of anemia -Repeat labs in 6 weeks to assess response.  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  She is asymptomatic currently. -Continue to monitor  CKD (chronic kidney disease) Patient has a history of CKD following with Dr. Wolfgang Phoenix.  Was previously on EPO shots for anemia. -Continue to follow with Dr. Wolfgang Phoenix -If anemia does not correct with iron replacement, will consider EPO shots   Orders Placed This Encounter  Procedures   Ferritin    Standing Status:   Future    Standing Expiration Date:   02/10/2024   Folate    Standing Status:   Future    Standing Expiration Date:   02/10/2024   Vitamin B12    Standing Status:   Future    Standing Expiration Date:   02/10/2024   CBC with Differential/Platelet    Standing Status:   Future    Standing Expiration Date:   02/10/2024   Comprehensive metabolic panel    Standing Status:   Future    Standing Expiration Date:   02/10/2024   Iron and TIBC    Standing Status:   Future    Standing Expiration Date:   02/10/2024    The total time spent in the appointment was 40 minutes encounter with patients including review of chart and various tests results, discussions about plan of care and coordination of care plan   All questions were answered. The patient knows to call the clinic with any problems, questions or concerns. No barriers to learning was detected.   Cindie Crumbly, MD 11/13/20244:05 PM

## 2023-02-10 NOTE — Assessment & Plan Note (Signed)
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  She is asymptomatic currently. -Continue to monitor

## 2023-02-12 ENCOUNTER — Encounter: Payer: Self-pay | Admitting: *Deleted

## 2023-02-15 DIAGNOSIS — I4821 Permanent atrial fibrillation: Secondary | ICD-10-CM | POA: Diagnosis not present

## 2023-02-15 DIAGNOSIS — I5032 Chronic diastolic (congestive) heart failure: Secondary | ICD-10-CM | POA: Diagnosis not present

## 2023-02-15 DIAGNOSIS — I110 Hypertensive heart disease with heart failure: Secondary | ICD-10-CM | POA: Diagnosis not present

## 2023-02-15 DIAGNOSIS — C3412 Malignant neoplasm of upper lobe, left bronchus or lung: Secondary | ICD-10-CM | POA: Diagnosis not present

## 2023-02-15 DIAGNOSIS — D649 Anemia, unspecified: Secondary | ICD-10-CM | POA: Diagnosis not present

## 2023-02-15 DIAGNOSIS — Z1331 Encounter for screening for depression: Secondary | ICD-10-CM | POA: Diagnosis not present

## 2023-02-15 DIAGNOSIS — E1169 Type 2 diabetes mellitus with other specified complication: Secondary | ICD-10-CM | POA: Diagnosis not present

## 2023-02-15 DIAGNOSIS — J449 Chronic obstructive pulmonary disease, unspecified: Secondary | ICD-10-CM | POA: Diagnosis not present

## 2023-02-15 DIAGNOSIS — Z1389 Encounter for screening for other disorder: Secondary | ICD-10-CM | POA: Diagnosis not present

## 2023-02-15 DIAGNOSIS — M170 Bilateral primary osteoarthritis of knee: Secondary | ICD-10-CM | POA: Diagnosis not present

## 2023-02-18 DIAGNOSIS — M79674 Pain in right toe(s): Secondary | ICD-10-CM | POA: Diagnosis not present

## 2023-02-18 DIAGNOSIS — M79675 Pain in left toe(s): Secondary | ICD-10-CM | POA: Diagnosis not present

## 2023-02-18 DIAGNOSIS — B351 Tinea unguium: Secondary | ICD-10-CM | POA: Diagnosis not present

## 2023-02-23 ENCOUNTER — Inpatient Hospital Stay: Payer: Medicare Other

## 2023-02-23 VITALS — BP 115/66 | HR 73 | Temp 97.6°F | Resp 18

## 2023-02-23 DIAGNOSIS — I4891 Unspecified atrial fibrillation: Secondary | ICD-10-CM | POA: Diagnosis not present

## 2023-02-23 DIAGNOSIS — D509 Iron deficiency anemia, unspecified: Secondary | ICD-10-CM | POA: Diagnosis not present

## 2023-02-23 DIAGNOSIS — K31811 Angiodysplasia of stomach and duodenum with bleeding: Secondary | ICD-10-CM | POA: Diagnosis not present

## 2023-02-23 DIAGNOSIS — C7A090 Malignant carcinoid tumor of the bronchus and lung: Secondary | ICD-10-CM | POA: Diagnosis not present

## 2023-02-23 DIAGNOSIS — D500 Iron deficiency anemia secondary to blood loss (chronic): Secondary | ICD-10-CM

## 2023-02-23 DIAGNOSIS — N183 Chronic kidney disease, stage 3 unspecified: Secondary | ICD-10-CM | POA: Diagnosis not present

## 2023-02-23 DIAGNOSIS — Z7901 Long term (current) use of anticoagulants: Secondary | ICD-10-CM | POA: Diagnosis not present

## 2023-02-23 MED ORDER — CETIRIZINE HCL 10 MG PO TABS
10.0000 mg | ORAL_TABLET | Freq: Once | ORAL | Status: AC
  Administered 2023-02-23: 10 mg via ORAL
  Filled 2023-02-23: qty 1

## 2023-02-23 MED ORDER — SODIUM CHLORIDE 0.9 % IV SOLN: INTRAVENOUS | Status: DC

## 2023-02-23 MED ORDER — ACETAMINOPHEN 325 MG PO TABS
650.0000 mg | ORAL_TABLET | Freq: Once | ORAL | Status: AC
  Administered 2023-02-23: 650 mg via ORAL
  Filled 2023-02-23: qty 2

## 2023-02-23 MED ORDER — SODIUM CHLORIDE 0.9 % IV SOLN
1000.0000 mg | Freq: Once | INTRAVENOUS | Status: AC
  Administered 2023-02-23: 1000 mg via INTRAVENOUS
  Filled 2023-02-23: qty 10

## 2023-02-23 NOTE — Patient Instructions (Signed)
[facility] - A DEPT OF [facility]  Discharge Instructions: Thank you for choosing [facility] to provide your oncology and hematology care.  If you have a lab appointment with the Cancer Center - please note that after April 8th, 2024, all labs will be drawn in the cancer center.  You do not have to check in or register with the main entrance as you have in the past but will complete your check-in in the cancer center.  Wear comfortable clothing and clothing appropriate for easy access to any Portacath or PICC line.   We strive to give you quality time with your provider. You may need to reschedule your appointment if you arrive late (15 or more minutes).  Arriving late affects you and other patients whose appointments are after yours.  Also, if you miss three or more appointments without notifying the office, you may be dismissed from the clinic at the provider's discretion.      For prescription refill requests, have your pharmacy contact our office and allow 72 hours for refills to be completed.    Today you received Monoferic IV iron infusion.     BELOW ARE SYMPTOMS THAT SHOULD BE REPORTED IMMEDIATELY: *FEVER GREATER THAN 100.4 F (38 C) OR HIGHER *CHILLS OR SWEATING *NAUSEA AND VOMITING THAT IS NOT CONTROLLED WITH YOUR NAUSEA MEDICATION *UNUSUAL SHORTNESS OF BREATH *UNUSUAL BRUISING OR BLEEDING *URINARY PROBLEMS (pain or burning when urinating, or frequent urination) *BOWEL PROBLEMS (unusual diarrhea, constipation, pain near the anus) TENDERNESS IN MOUTH AND THROAT WITH OR WITHOUT PRESENCE OF ULCERS (sore throat, sores in mouth, or a toothache) UNUSUAL RASH, SWELLING OR PAIN  UNUSUAL VAGINAL DISCHARGE OR ITCHING   Items with * indicate a potential emergency and should be followed up as soon as possible or go to the Emergency Department if any problems should occur.  Please show the CHEMOTHERAPY ALERT CARD or IMMUNOTHERAPY ALERT CARD  at check-in to the Emergency Department and triage nurse.  Should you have questions after your visit or need to cancel or reschedule your appointment, please contact [facility] - A DEPT OF [facility] [phone]  and follow the prompts.  Office hours are 8:00 a.m. to 4:30 p.m. Monday - Friday. Please note that voicemails left after 4:00 p.m. may not be returned until the following business day.  We are closed weekends and major holidays. You have access to a nurse at all times for urgent questions. Please call the main number to the clinic [phone] and follow the prompts.  For any non-urgent questions, you may also contact your provider using MyChart. We now offer e-Visits for anyone 95 and older to request care online for non-urgent symptoms. For details visit [email or web address].   Also download the MyChart app! Go to the app store, search "MyChart", open the app, select [facility], and log in with your MyChart username and password.

## 2023-02-23 NOTE — Progress Notes (Signed)
Patient presents today for iron infusion. Patient is in satisfactory condition with no new complaints voiced.  Vital signs are stable.  We will proceed with infusion per provider orders.    Peripheral IV started with good blood return pre and post infusion.  Monoferric 1,000 mg given today per MD orders. Tolerated infusion without adverse affects. Vital signs stable. No complaints at this time. Discharged from clinic via wheelchair in stable condition. Alert and oriented x 3. F/U with [facility] as scheduled.

## 2023-03-03 DIAGNOSIS — E1169 Type 2 diabetes mellitus with other specified complication: Secondary | ICD-10-CM | POA: Diagnosis not present

## 2023-03-03 DIAGNOSIS — D649 Anemia, unspecified: Secondary | ICD-10-CM | POA: Diagnosis not present

## 2023-03-03 DIAGNOSIS — N184 Chronic kidney disease, stage 4 (severe): Secondary | ICD-10-CM | POA: Diagnosis not present

## 2023-03-03 DIAGNOSIS — Z0001 Encounter for general adult medical examination with abnormal findings: Secondary | ICD-10-CM | POA: Diagnosis not present

## 2023-03-03 DIAGNOSIS — E559 Vitamin D deficiency, unspecified: Secondary | ICD-10-CM | POA: Diagnosis not present

## 2023-03-03 DIAGNOSIS — I10 Essential (primary) hypertension: Secondary | ICD-10-CM | POA: Diagnosis not present

## 2023-03-04 ENCOUNTER — Ambulatory Visit (INDEPENDENT_AMBULATORY_CARE_PROVIDER_SITE_OTHER): Payer: Medicare Other | Admitting: "Endocrinology

## 2023-03-04 ENCOUNTER — Encounter: Payer: Self-pay | Admitting: "Endocrinology

## 2023-03-04 VITALS — BP 110/58 | HR 76 | Ht 62.0 in | Wt 241.6 lb

## 2023-03-04 DIAGNOSIS — E782 Mixed hyperlipidemia: Secondary | ICD-10-CM

## 2023-03-04 DIAGNOSIS — E1122 Type 2 diabetes mellitus with diabetic chronic kidney disease: Secondary | ICD-10-CM

## 2023-03-04 DIAGNOSIS — N184 Chronic kidney disease, stage 4 (severe): Secondary | ICD-10-CM

## 2023-03-04 DIAGNOSIS — Z794 Long term (current) use of insulin: Secondary | ICD-10-CM | POA: Diagnosis not present

## 2023-03-04 DIAGNOSIS — I10 Essential (primary) hypertension: Secondary | ICD-10-CM | POA: Diagnosis not present

## 2023-03-04 LAB — POCT GLYCOSYLATED HEMOGLOBIN (HGB A1C): HbA1c, POC (controlled diabetic range): 5.8 % (ref 0.0–7.0)

## 2023-03-04 MED ORDER — HUMULIN R U-500 KWIKPEN 500 UNIT/ML ~~LOC~~ SOPN: 30.0000 [IU] | PEN_INJECTOR | Freq: Three times a day (TID) | SUBCUTANEOUS | 2 refills | Status: DC

## 2023-03-04 NOTE — Progress Notes (Signed)
03/04/2023  Endocrinology follow-up note   Subjective:    Patient ID: [name], female    DOB: [birth year], PCP Benetta Spar, MD   Past Medical History:  Diagnosis Date   Arthritis    Asthma    Atrial fibrillation [facility])    Carcinoid tumor determined by biopsy of lung 05/2018   Left lung   Chronic diastolic CHF (congestive heart failure) (HCC)    CKD (chronic kidney disease), stage III (HCC)    Diabetes mellitus type 2 in obese    Essential hypertension    GERD (gastroesophageal reflux disease)    Heart murmur    Iron deficiency anemia 10/16/2010   Mental handicap 10/16/2010   Mild CAD 2013   Morbid obesity (HCC)    OSA (obstructive sleep apnea)    Past Surgical History:  Procedure Laterality Date   ABDOMINAL HYSTERECTOMY     AV FISTULA PLACEMENT Right 06/06/2018   Procedure: ARTERIOVENOUS (AV) FISTULA CREATION;  Surgeon: Cephus Shelling, MD;  Location: [facility] OR;  Service: Vascular;  Laterality: Right;   BIOPSY  10/16/2021   Procedure: BIOPSY;  Surgeon: Corbin Ade, MD;  [facility]: [facility];  Service: Endoscopy;;   BRONCHIAL DILITATION  12/13/2020   Procedure: BRONCHIAL DILITATION;  Surgeon: Josephine Igo, DO;  Location: [facility];  Service: Pulmonary;;   CESAREAN SECTION     CHOLECYSTECTOMY     COLONOSCOPY  08/2010   normal TI, sigmoid polyp (adenoma ). Next TCS due  08/2015,   COLONOSCOPY WITH PROPOFOL N/A 07/08/2020   Procedure: COLONOSCOPY WITH PROPOFOL;  Surgeon: Corbin Ade, MD;  Location: [facility];  Service: Endoscopy;  Laterality: N/A;  AM (diabetic and facility patient)   ESOPHAGOGASTRODUODENOSCOPY  08/2010   antral and duodenal erosions s/p bx (chronic gastritis, no h.pylori, no celiac dz ), hiatal hernia   ESOPHAGOGASTRODUODENOSCOPY (EGD) WITH PROPOFOL N/A 10/16/2021   Procedure: ESOPHAGOGASTRODUODENOSCOPY (EGD) WITH PROPOFOL;  Surgeon: Corbin Ade, MD;  [facility];  Service: Endoscopy;  Laterality: N/A;   11:45am   ESOPHAGOGASTRODUODENOSCOPY (EGD) WITH PROPOFOL N/A 12/18/2021   Procedure: ESOPHAGOGASTRODUODENOSCOPY (EGD) WITH PROPOFOL;  Surgeon: Lanelle Bal, DO;  [facility];  Service: Endoscopy;  Laterality: N/A;   ESOPHAGOGASTRODUODENOSCOPY (EGD) WITH PROPOFOL N/A 01/19/2023   Procedure: ESOPHAGOGASTRODUODENOSCOPY (EGD) WITH PROPOFOL;  Surgeon: Dolores Frame, MD;  [facility]: [facility];  Service: Gastroenterology;  Laterality: N/A;  2:30 pm, asa 3   FISTULA SUPERFICIALIZATION Right 08/25/2018   Procedure: FISTULA SUPERFICIALIZATION RIGHT ARM;  Surgeon: Maeola Harman, MD;  Location: [facility] OR;  Service: Cardiovascular;  Laterality: Right;   HOT HEMOSTASIS  12/18/2021   Procedure: HOT HEMOSTASIS (ARGON PLASMA COAGULATION/BICAP);  Surgeon: Lanelle Bal, DO;  [facility];  Service: Endoscopy;;   HOT HEMOSTASIS  01/19/2023   Procedure: HOT HEMOSTASIS (ARGON PLASMA COAGULATION/BICAP);  Surgeon: Marguerita Merles, Reuel Boom, MD;  [facility]: [facility];  Service: Gastroenterology;;   IR FLUORO GUIDE CV LINE RIGHT  05/31/2018   IR US GUIDE VASC ACCESS RIGHT  05/31/2018   KNEE SURGERY     right knee @ 67 years of age   LEFT AND RIGHT HEART CATHETERIZATION WITH CORONARY ANGIOGRAM N/A 09/21/2011   Procedure: LEFT AND RIGHT HEART CATHETERIZATION WITH CORONARY ANGIOGRAM;  Surgeon: Ricki Rodriguez, MD;  Location: [facility];  Service: Cardiovascular;  Laterality: N/A;   POLYPECTOMY  07/08/2020   Procedure: POLYPECTOMY INTESTINAL;  Surgeon: Corbin Ade, MD;  [facility];  Service: Endoscopy;;   VIDEO BRONCHOSCOPY Left 12/13/2020   Procedure: VIDEO BRONCHOSCOPY WITHOUT FLUORO;  Surgeon: Josephine Igo, DO;  Location: [facility];  Service: Pulmonary;  Laterality: Left;  Cryotherapy   Social History   Socioeconomic History   Marital status: Divorced    Spouse name: Not on file   Number of children: 1   Years of education: Not on file    Highest education level: Not on file  Occupational History    Employer: UNEMPLOYED  Tobacco Use   Smoking status: Former    Current packs/day: 0.00    Types: Cigarettes    Start date: 04/11/1996    Quit date: 04/11/1996    Years since quitting: 26.9   Smokeless tobacco: Never   Tobacco comments:    quit a couple year ago  Vaping Use   Vaping status: Never Used  Substance and Sexual Activity   Alcohol use: No   Drug use: No   Sexual activity: Never  Other Topics Concern   Not on file  Social History Narrative   Lives with parents.    Social Determinants of Health   Financial Resource Strain: Not on file  Food Insecurity: No Food Insecurity (12/17/2021)   Hunger Vital Sign    Worried About Running Out of Food in the Last Year: Never true    Ran Out of Food in the Last Year: Never true  Transportation Needs: No Transportation Needs (12/17/2021)   PRAPARE - Administrator, Civil Service (Medical): No    Lack of Transportation (Non-Medical): No  Physical Activity: Not on file  Stress: Not on file  Social Connections: Not on file   Outpatient Encounter Medications as of 03/04/2023  Medication Sig   acetaminophen (TYLENOL) 325 MG tablet Take 325 mg by mouth every 12 (twelve) hours as needed for moderate pain (pain score 4-6).   albuterol (PROVENTIL HFA;VENTOLIN HFA) 108 (90 Base) MCG/ACT inhaler Inhale 2 puffs into the lungs every 4 (four) hours as needed for shortness of breath.   apixaban (ELIQUIS) 5 MG TABS tablet Take 1 tablet (5 mg total) by mouth 2 (two) times daily.   calcitRIOL (ROCALTROL) 0.25 MCG capsule Take 0.25 mcg by mouth every Monday, Wednesday, and Friday.   cetirizine (ZYRTEC) 10 MG tablet Take 10 mg by mouth daily.   Cholecalciferol (VITAMIN D3) 50 MCG (2000 UT) TABS TAKE 1 TABLET BY MOUTH ONCE DAILY.   diltiazem (CARDIZEM CD) 240 MG 24 hr capsule Take 1 capsule (240 mg total) by mouth daily.   ferrous sulfate 325 (65 FE) MG tablet Take 1 tablet (325  mg total) by mouth every other day.   Finerenone (KERENDIA) 10 MG TABS Take 10 mg by mouth daily.   fluticasone (FLONASE) 50 MCG/ACT nasal spray Place 2 sprays into both nostrils daily as needed for allergies.   furosemide (LASIX) 40 MG tablet Take 20 mg by mouth See admin instructions. Take 20 mg twice daily, alternating with 20 mg daily every other day   GERI-TUSSIN DM 10-100 MG/5ML liquid Take by mouth.   glucose blood (EASYMAX TEST) test strip CHECK BLOOD SUGAR 4 TIMES A DAY. (BREAKFAST, LUNCH, SUPPER & BEDTIME)   insulin regular human CONCENTRATED (HUMULIN R U-500 KWIKPEN) 500 UNIT/ML KwikPen Inject 30-40 Units into the skin 3 (three) times daily with meals. Inject 40 units with breakfast, 30 units with lunch, and 40 units with supper only if pre-meal glucose is above 90 and only if she  eats.   ipratropium-albuterol (DUONEB) 0.5-2.5 (3) MG/3ML SOLN Take 3 mLs by nebulization every 6 (six) hours as needed (for cough).   isosorbide dinitrate (ISORDIL) 10 MG tablet Take 1 tablet (10 mg total) by mouth 3 (three) times daily.   Lancets 28G MISC CHECK BLOOD SUGAR 4 TIMES A DAY. (BREAKFAST, LUNCH, SUPPER, & BEDTIME)   loperamide (IMODIUM) 2 MG capsule Take 2-4 mg by mouth See admin instructions. Take 4 mg by mouth now: then 2 mg after each loose stool as needed, do not exceed 5 doses in 24 hours.   losartan (COZAAR) 25 MG tablet Take 12.5 mg by mouth daily.   metolazone (ZAROXOLYN) 5 MG tablet Take 5 mg by mouth in the morning.   metoprolol tartrate (LOPRESSOR) 50 MG tablet Take 50 mg by mouth 2 (two) times daily.   montelukast (SINGULAIR) 10 MG tablet Take 10 mg by mouth at bedtime.   pantoprazole (PROTONIX) 40 MG tablet Take one bid   pantoprazole (PROTONIX) 40 MG tablet Take one daily 30 minutes before breakfast   polyethylene glycol powder (GLYCOLAX/MIRALAX) 17 GM/SCOOP powder Take 17 g by mouth daily as needed for mild constipation or moderate constipation.   potassium chloride SA (KLOR-CON M)  20 MEQ tablet Take 20 mEq by mouth daily.   rosuvastatin (CRESTOR) 20 MG tablet TAKE (1) TABLET BY MOUTH ONCE DAILY. (Patient taking differently: Take 10 mg by mouth daily.)   TRELEGY ELLIPTA 100-62.5-25 MCG/ACT AEPB Inhale 1 puff into the lungs daily.   triamcinolone cream (KENALOG) 0.5 % Apply 1 application  topically every 12 (twelve) hours as needed (applied to bilateral legs for allergic dermatitis).   [DISCONTINUED] HUMULIN R U-500 KWIKPEN 500 UNIT/ML KwikPen INJECT 40 UNITS SUBCUTANEOUSLY AT BREAKFAST, LUNCH & SUPPER; WHEN GLUCOSE IS ABOVE 90 & EATING.(HOLD IF BS BELOW 70: CALL MD IF BS ABOVE 400)   No facility-administered encounter medications on file as of 03/04/2023.   ALLERGIES: No Known Allergies VACCINATION STATUS: Immunization History  Administered Date(s) Administered   Hepatitis B, ADULT 07/29/2018, 08/29/2018, 10/07/2018   Influenza Inj Mdck Quad Pf 12/19/2016   Influenza,inj,Quad PF,6+ Mos 02/02/2013, 05/15/2018   Influenza-Unspecified 05/15/2018   PPD Test 07/22/2018   Pneumococcal Polysaccharide-23 05/15/2018   Pneumococcal-Unspecified 05/15/2018    Diabetes She presents for her follow-up diabetic visit. She has type 2 diabetes mellitus. Onset time: she was diagnosed at approximate age of 77 years. Her disease course has been fluctuating. There are no hypoglycemic associated symptoms. Pertinent negatives for hypoglycemia include no confusion, headaches, pallor or seizures. Pertinent negatives for diabetes include no blurred vision, no chest pain, no fatigue, no polydipsia, no polyphagia and no polyuria. There are no hypoglycemic complications. Symptoms are worsening. Diabetic complications include nephropathy. Risk factors for coronary artery disease include diabetes mellitus, dyslipidemia, hypertension, obesity and sedentary lifestyle. Current diabetic treatment includes intensive insulin program. Her weight is decreasing steadily. She is following a generally unhealthy  diet. She has had a previous visit with a dietitian. She never participates in exercise. Her home blood glucose trend is fluctuating minimally. Her breakfast blood glucose range is generally 180-200 mg/dl. Her lunch blood glucose range is generally 140-180 mg/dl. Her dinner blood glucose range is generally 110-130 mg/dl. Her bedtime blood glucose range is generally 140-180 mg/dl. Her overall blood glucose range is 140-180 mg/dl. (She is accompanied by her aide from nursing home.   Her glycemic profile continues to be discordant with her measured A1c of 5.8% today.  This is due to her ongoing  anemia.  She does not report significant hypoglycemia, however tightening glycemic profile before supper.     ) An ACE inhibitor/angiotensin II receptor blocker is being taken.  Hypertension This is a chronic problem. The current episode started more than 1 year ago. The problem is uncontrolled. Pertinent negatives include no blurred vision, chest pain, headaches, palpitations or shortness of breath. Risk factors for coronary artery disease include diabetes mellitus, dyslipidemia, obesity and sedentary lifestyle. Past treatments include ACE inhibitors. Hypertensive end-organ damage includes kidney disease. Identifiable causes of hypertension include chronic renal disease.  Hyperlipidemia This is a chronic problem. The current episode started more than 1 year ago. The problem is uncontrolled. Exacerbating diseases include chronic renal disease, diabetes and obesity. Pertinent negatives include no chest pain, myalgias or shortness of breath. Current antihyperlipidemic treatment includes statins and fibric acid derivatives. Risk factors for coronary artery disease include diabetes mellitus, dyslipidemia, obesity, a sedentary lifestyle and hypertension.    Review of Systems  Constitutional:  Negative for chills, fatigue and fever.  HENT:  Negative for trouble swallowing and voice change.   Eyes:  Negative for blurred  vision and visual disturbance.  Respiratory:  Negative for cough, shortness of breath and wheezing.   Cardiovascular:  Negative for chest pain, palpitations and leg swelling.  Gastrointestinal:  Negative for diarrhea, nausea and vomiting.  Endocrine: Negative for cold intolerance, heat intolerance, polydipsia, polyphagia and polyuria.  Musculoskeletal:  Positive for gait problem. Negative for arthralgias and myalgias.  Skin:  Negative for color change, pallor, rash and wound.  Neurological:  Negative for seizures and headaches.  Hematological:  Does not bruise/bleed easily.  Psychiatric/Behavioral:  Negative for confusion and suicidal ideas.     Objective:    BP (!) 110/58   Pulse 76   Ht 5\' 2"  (1.575 m)   Wt 241 lb 9.6 oz (109.6 kg)   BMI 44.19 kg/m   Wt Readings from Last 3 Encounters:  03/04/23 241 lb 9.6 oz (109.6 kg)  02/10/23 238 lb 9.6 oz (108.2 kg)  01/27/23 242 lb (109.8 kg)      Results for orders placed or performed in visit on 03/04/23  HgB A1c  Result Value Ref Range   Hemoglobin A1C     HbA1c POC (<> result, manual entry)     HbA1c, POC (prediabetic range)     HbA1c, POC (controlled diabetic range) 5.8 0.0 - 7.0 %   Complete Blood Count (Most recent): Lab Results  Component Value Date   WBC 9.2 01/27/2023   HGB 8.3 (L) 01/27/2023   HCT 27.2 (L) 01/27/2023   MCV 89.8 01/27/2023   PLT 272 01/27/2023    Diabetic Labs (most recent): Lab Results  Component Value Date   HGBA1C 5.8 03/04/2023   HGBA1C 5.4 10/13/2022   HGBA1C 5.1 12/17/2021   MICROALBUR 150 04/25/2020   MICROALBUR 23.4 09/12/2019   MICROALBUR 0.2 07/13/2017   Lipid Panel     Component Value Date/Time   CHOL 107 10/06/2022 0802   TRIG 133 10/06/2022 0802   HDL 42 10/06/2022 0802   CHOLHDL 2.5 10/06/2022 0802   CHOLHDL 5.5 (H) 09/12/2019 0831   VLDL 59 (H) 06/02/2018 0029   LDLCALC 42 10/06/2022 0802   LDLCALC 124 (H) 09/12/2019 0831    Assessment & Plan:   1. Type 2 diabetes  mellitus with stage 3-4 chronic kidney disease, with long-term current use of insulin   -Her diabetes is  complicated by CKD, congestive heart failure,  obesity/sedentary life, and  patient remains at extremely high risk for more acute and chronic complications of diabetes which include CAD, CVA, CKD, retinopathy, and neuropathy. These are all discussed in detail with the patient.  She is accompanied by her aide from nursing home.   Her glycemic profile continues to be discordant with her measured A1c of 5.8% today.  This is due to her ongoing anemia.  She does not report significant hypoglycemia, however tightening glycemic profile before supper.   - I have re-counseled the patient on diet management and weight loss  by adopting a carbohydrate restricted / protein rich  Diet.   - she acknowledges that there is a room for improvement in her food and drink choices. - Suggestion is made for her to avoid simple carbohydrates  from her diet including Cakes, Sweet Desserts, Ice Cream, Soda (diet and regular), Sweet Tea, Candies, Chips, Cookies, Store Bought Juices, Alcohol in Excess of  1-2 drinks a day, Artificial Sweeteners,  Coffee Creamer, and "Sugar-free" Products, Lemonade. This will help patient to have more stable blood glucose profile and potentially avoid unintended weight gain.  - Patient is advised to stick to a routine mealtimes to eat 3 meals  a day and avoid unnecessary snacks ( to snack only to correct hypoglycemia).  - I have approached patient with the following individualized plan to manage diabetes and patient agrees.  -Her placement in group home is the best development for her lately.  -Her presentation with above target glycemia but point-of-care A1c of 5.8% is a reflection of her anemia with hemoglobin of 8.3.   -It is better for her to stay on Humulin RU 500 insulin for the sake of simplicity.  -She is advised to continue Humulin  U500 40 units at breakfast, 30 units at  lunch, 40 units with supper when glucose is above 90 mg/dl associated with monitoring of blood glucose before meals and at bedtime. - She is advised to skip insulin if pre-meal blood glucose is below 90 mg/dL or if she is not eating. -She is not a candidate for metformin.   - Patient specific target  for A1c; LDL, HDL, Triglycerides, were discussed in detail.  2) BP/HTN:  -Her blood pressure is controlled to target.  She has urine microalbuminuria .She is advised on  salt restriction  and I advised her to continue current medications including lisinopril 20 mg p.o. daily, hydralazine 25 mg p.o. 3 times daily, furosemide 40 mg p.o. Daily.  3) Lipids/HPL: Her recent lipid panel showed significantly controlled LDL at 42.   She is advised to continue Crestor 20 mg p.o. nightly.     Side effects and precautions discussed with her.    She is also on fenofibrate.     4)  Weight/Diet: Her BMI is 44.19-recently losing weight progressively.  She is still a candidate for modest weight loss.   CDE consult in progress, exercise, and carbohydrates information provided.  5) Chronic Care/Health Maintenance:  -Patient is  on ACEI and encouraged to continue to follow up with Ophthalmology, Podiatrist at least yearly or according to recommendations, and advised to stay away from smoking. I have recommended yearly flu vaccine and pneumonia vaccination at least every 5 years;  and  sleep for at least 7 hours a day.    -She is advised to maintain close follow-up with her nephrologist.  - I advised patient to maintain close follow up with Felecia Shelling, [name], MD for primary care needs.   I spent  41  minutes  in the care of the patient today including review of labs from CMP, Lipids, Thyroid Function, Hematology (current and previous including abstractions from other facilities); face-to-face time discussing  her blood glucose readings/logs, discussing hypoglycemia and hyperglycemia episodes and symptoms,  medications doses, her options of short and long term treatment based on the latest standards of care / guidelines;  discussion about incorporating lifestyle medicine;  and documenting the encounter. Risk reduction counseling performed per USPSTF guidelines to reduce  obesity and cardiovascular risk factors.     Please refer to Patient Instructions for Blood Glucose Monitoring and Insulin/Medications Dosing Guide"  in media tab for additional information. Please  also refer to " Patient Self Inventory" in the Media  tab for reviewed elements of pertinent patient history.  [name] participated in the discussions, expressed understanding, and voiced agreement with the above plans.  All questions were answered to her satisfaction. she is encouraged to contact clinic should she have any questions or concerns prior to her return visit.    Follow up plan: -Return in about 4 months (around 07/03/2023) for Bring Meter/CGM Device/Logs- A1c in Office.  Marquis Lunch, MD Phone: [phone]  Fax: [phone]  This note was partially dictated with voice recognition software. Similar sounding words can be transcribed inadequately or may not  be corrected upon review.  03/04/2023, 2:16 PM

## 2023-03-04 NOTE — Patient Instructions (Signed)
                                     Advice for Weight Management  -For most of us the best way to lose weight is by diet management. Generally speaking, diet management means consuming less calories intentionally which over time brings about progressive weight loss.  This can be achieved more effectively by avoiding ultra processed carbohydrates, processed meats, unhealthy fats.    It is critically important to know your numbers: how much calorie you are consuming and how much calorie you need. More importantly, our carbohydrates sources should be unprocessed naturally occurring  complex starch food items.  It is always important to balance nutrition also by  appropriate intake of proteins (mainly plant-based), healthy fats/oils, plenty of fruits and vegetables.   -The American College of Lifestyle Medicine (ACL M) recommends nutrition derived mostly from Whole Food, Plant Predominant Sources example an apple instead of applesauce or apple pie. Eat Plenty of vegetables, Mushrooms, fruits, Legumes, Whole Grains, Nuts, seeds in lieu of processed meats, processed snacks/pastries red meat, poultry, eggs.  Use only water or unsweetened tea for hydration.  The College also recommends the need to stay away from risky substances including alcohol, smoking; obtaining 7-9 hours of restorative sleep, at least 150 minutes of moderate intensity exercise weekly, importance of healthy social connections, and being mindful of stress and seek help when it is overwhelming.    -Sticking to a routine mealtime to eat 3 meals a day and avoiding unnecessary snacks is shown to have a big role in weight control. Under normal circumstances, the only time we burn stored energy is when we are hungry, so allow  some hunger to take place- hunger means no food between appropriate meal times, only water.  It is not advisable to starve.   -It is better to avoid simple carbohydrates including:  Cakes, Sweet Desserts, Ice Cream, Soda (diet and regular), Sweet Tea, Candies, Chips, Cookies, Store Bought Juices, Alcohol in Excess of  1-2 drinks a day, Lemonade,  Artificial Sweeteners, Doughnuts, Coffee Creamers, "Sugar-free" Products, etc, etc.  This is not a complete list.....    -Consulting with certified diabetes educators is proven to provide you with the most accurate and current information on diet.  Also, you may be  interested in discussing diet options/exchanges , we can schedule a visit with [name], RDN, CDE for individualized nutrition education.  -Exercise: If you are able: 30 -60 minutes a day ,4 days a week, or 150 minutes of moderate intensity exercise weekly.    The longer the better if tolerated.  Combine stretch, strength, and aerobic activities.  If you were told in the past that you have high risk for cardiovascular diseases, or if you are currently symptomatic, you may seek evaluation by your heart doctor prior to initiating moderate to intense exercise programs.                                  Additional Care Considerations for Diabetes/Prediabetes   -Diabetes  is a chronic disease.  The most important care consideration is regular follow-up with your diabetes care provider with the goal being avoiding or delaying its complications and to take advantage of advances in medications and technology.  If appropriate actions are taken early enough, type 2 diabetes can even be   reversed.  Seek information from the right source.  - Whole Food, Plant Predominant Nutrition is highly recommended: Eat Plenty of vegetables, Mushrooms, fruits, Legumes, Whole Grains, Nuts, seeds in lieu of processed meats, processed snacks/pastries red meat, poultry, eggs as recommended by American College of  Lifestyle Medicine (ACLM).  -Type 2 diabetes is known to coexist with other important comorbidities such as high blood pressure and high cholesterol.  It is critical to control not only the  diabetes but also the high blood pressure and high cholesterol to minimize and delay the risk of complications including coronary artery disease, stroke, amputations, blindness, etc.  The good news is that this diet recommendation for type 2 diabetes is also very helpful for managing high cholesterol and high blood blood pressure.  - Studies showed that people with diabetes will benefit from a class of medications known as ACE inhibitors and statins.  Unless there are specific reasons not to be on these medications, the standard of care is to consider getting one from these groups of medications at an optimal doses.  These medications are generally considered safe and proven to help protect the heart and the kidneys.    - People with diabetes are encouraged to initiate and maintain regular follow-up with eye doctors, foot doctors, dentists , and if necessary heart and kidney doctors.     - It is highly recommended that people with diabetes quit smoking or stay away from smoking, and get yearly  flu vaccine and pneumonia vaccine at least every 5 years.  See above for additional recommendations on exercise, sleep, stress management , and healthy social connections.      

## 2023-03-11 DIAGNOSIS — N2581 Secondary hyperparathyroidism of renal origin: Secondary | ICD-10-CM | POA: Diagnosis not present

## 2023-03-11 DIAGNOSIS — I5032 Chronic diastolic (congestive) heart failure: Secondary | ICD-10-CM | POA: Diagnosis not present

## 2023-03-11 DIAGNOSIS — R809 Proteinuria, unspecified: Secondary | ICD-10-CM | POA: Diagnosis not present

## 2023-03-11 DIAGNOSIS — N189 Chronic kidney disease, unspecified: Secondary | ICD-10-CM | POA: Diagnosis not present

## 2023-03-11 DIAGNOSIS — E876 Hypokalemia: Secondary | ICD-10-CM | POA: Diagnosis not present

## 2023-03-11 DIAGNOSIS — N1832 Chronic kidney disease, stage 3b: Secondary | ICD-10-CM | POA: Diagnosis not present

## 2023-03-11 DIAGNOSIS — E1122 Type 2 diabetes mellitus with diabetic chronic kidney disease: Secondary | ICD-10-CM | POA: Diagnosis not present

## 2023-03-16 ENCOUNTER — Ambulatory Visit: Payer: Medicare Other | Admitting: Student

## 2023-03-17 DIAGNOSIS — I5032 Chronic diastolic (congestive) heart failure: Secondary | ICD-10-CM | POA: Diagnosis not present

## 2023-03-17 DIAGNOSIS — N184 Chronic kidney disease, stage 4 (severe): Secondary | ICD-10-CM | POA: Diagnosis not present

## 2023-03-18 ENCOUNTER — Other Ambulatory Visit: Payer: Self-pay | Admitting: "Endocrinology

## 2023-03-19 ENCOUNTER — Ambulatory Visit: Payer: Medicare Other | Admitting: Cardiology

## 2023-03-22 DIAGNOSIS — E1122 Type 2 diabetes mellitus with diabetic chronic kidney disease: Secondary | ICD-10-CM | POA: Diagnosis not present

## 2023-03-22 DIAGNOSIS — I5032 Chronic diastolic (congestive) heart failure: Secondary | ICD-10-CM | POA: Diagnosis not present

## 2023-03-22 DIAGNOSIS — N2581 Secondary hyperparathyroidism of renal origin: Secondary | ICD-10-CM | POA: Diagnosis not present

## 2023-03-22 DIAGNOSIS — R809 Proteinuria, unspecified: Secondary | ICD-10-CM | POA: Diagnosis not present

## 2023-04-07 ENCOUNTER — Inpatient Hospital Stay: Payer: Medicare Other | Attending: Oncology

## 2023-04-07 DIAGNOSIS — I4891 Unspecified atrial fibrillation: Secondary | ICD-10-CM | POA: Insufficient documentation

## 2023-04-07 DIAGNOSIS — Z08 Encounter for follow-up examination after completed treatment for malignant neoplasm: Secondary | ICD-10-CM | POA: Insufficient documentation

## 2023-04-07 DIAGNOSIS — N185 Chronic kidney disease, stage 5: Secondary | ICD-10-CM | POA: Diagnosis not present

## 2023-04-07 DIAGNOSIS — I120 Hypertensive chronic kidney disease with stage 5 chronic kidney disease or end stage renal disease: Secondary | ICD-10-CM | POA: Insufficient documentation

## 2023-04-07 DIAGNOSIS — Z7901 Long term (current) use of anticoagulants: Secondary | ICD-10-CM | POA: Diagnosis not present

## 2023-04-07 DIAGNOSIS — E1122 Type 2 diabetes mellitus with diabetic chronic kidney disease: Secondary | ICD-10-CM | POA: Insufficient documentation

## 2023-04-07 DIAGNOSIS — Z85110 Personal history of malignant carcinoid tumor of bronchus and lung: Secondary | ICD-10-CM | POA: Diagnosis not present

## 2023-04-07 DIAGNOSIS — D509 Iron deficiency anemia, unspecified: Secondary | ICD-10-CM

## 2023-04-07 DIAGNOSIS — K922 Gastrointestinal hemorrhage, unspecified: Secondary | ICD-10-CM | POA: Insufficient documentation

## 2023-04-07 DIAGNOSIS — E538 Deficiency of other specified B group vitamins: Secondary | ICD-10-CM | POA: Insufficient documentation

## 2023-04-07 DIAGNOSIS — D500 Iron deficiency anemia secondary to blood loss (chronic): Secondary | ICD-10-CM | POA: Insufficient documentation

## 2023-04-07 LAB — IRON AND TIBC
Iron: 39 ug/dL (ref 28–170)
Saturation Ratios: 11 % (ref 10.4–31.8)
TIBC: 346 ug/dL (ref 250–450)
UIBC: 307 ug/dL

## 2023-04-07 LAB — FOLATE: Folate: 5.4 ng/mL — ABNORMAL LOW (ref >5.9)

## 2023-04-07 LAB — COMPREHENSIVE METABOLIC PANEL
ALT: 12 U/L (ref 0–44)
AST: 16 U/L (ref 15–41)
Albumin: 3.5 g/dL (ref 3.5–5.0)
Alkaline Phosphatase: 64 U/L (ref 38–126)
Anion gap: 9 (ref 5–15)
BUN: 45 mg/dL — ABNORMAL HIGH (ref 8–23)
CO2: 26 mmol/L (ref 22–32)
Calcium: 9.3 mg/dL (ref 8.9–10.3)
Chloride: 97 mmol/L — ABNORMAL LOW (ref 98–111)
Creatinine, Ser: 1.97 mg/dL — ABNORMAL HIGH (ref 0.44–1.00)
GFR, Estimated: 27 mL/min — ABNORMAL LOW (ref >60)
Glucose, Bld: 175 mg/dL — ABNORMAL HIGH (ref 70–99)
Potassium: 3.7 mmol/L (ref 3.5–5.1)
Sodium: 132 mmol/L — ABNORMAL LOW (ref 135–145)
Total Bilirubin: 0.4 mg/dL (ref 0.0–1.2)
Total Protein: 7.2 g/dL (ref 6.5–8.1)

## 2023-04-07 LAB — FERRITIN: Ferritin: 63 ng/mL (ref 11–307)

## 2023-04-07 LAB — CBC WITH DIFFERENTIAL/PLATELET
Abs Immature Granulocytes: 0.05 10*3/uL (ref 0.00–0.07)
Basophils Absolute: 0 10*3/uL (ref 0.0–0.1)
Basophils Relative: 1 %
Eosinophils Absolute: 0.3 10*3/uL (ref 0.0–0.5)
Eosinophils Relative: 4 %
HCT: 26.6 % — ABNORMAL LOW (ref 36.0–46.0)
Hemoglobin: 8.2 g/dL — ABNORMAL LOW (ref 12.0–15.0)
Immature Granulocytes: 1 %
Lymphocytes Relative: 14 %
Lymphs Abs: 1 10*3/uL (ref 0.7–4.0)
MCH: 29.5 pg (ref 26.0–34.0)
MCHC: 30.8 g/dL (ref 30.0–36.0)
MCV: 95.7 fL (ref 80.0–100.0)
Monocytes Absolute: 0.6 10*3/uL (ref 0.1–1.0)
Monocytes Relative: 9 %
Neutro Abs: 5.2 10*3/uL (ref 1.7–7.7)
Neutrophils Relative %: 71 %
Platelets: 260 10*3/uL (ref 150–400)
RBC: 2.78 MIL/uL — ABNORMAL LOW (ref 3.87–5.11)
RDW: 17.1 % — ABNORMAL HIGH (ref 11.5–15.5)
WBC: 7.2 10*3/uL (ref 4.0–10.5)
nRBC: 0 % (ref 0.0–0.2)

## 2023-04-07 LAB — VITAMIN B12: Vitamin B-12: 419 pg/mL (ref 180–914)

## 2023-04-14 ENCOUNTER — Inpatient Hospital Stay: Payer: Medicare Other | Admitting: Oncology

## 2023-04-17 DIAGNOSIS — N184 Chronic kidney disease, stage 4 (severe): Secondary | ICD-10-CM | POA: Diagnosis not present

## 2023-04-17 DIAGNOSIS — I5032 Chronic diastolic (congestive) heart failure: Secondary | ICD-10-CM | POA: Diagnosis not present

## 2023-04-19 ENCOUNTER — Other Ambulatory Visit: Payer: Self-pay | Admitting: "Endocrinology

## 2023-04-21 ENCOUNTER — Inpatient Hospital Stay (HOSPITAL_BASED_OUTPATIENT_CLINIC_OR_DEPARTMENT_OTHER): Payer: Medicare Other | Admitting: Oncology

## 2023-04-21 ENCOUNTER — Encounter: Payer: Self-pay | Admitting: Oncology

## 2023-04-21 VITALS — BP 121/70 | HR 85 | Temp 97.7°F | Resp 18 | Wt 224.0 lb

## 2023-04-21 DIAGNOSIS — N185 Chronic kidney disease, stage 5: Secondary | ICD-10-CM | POA: Diagnosis not present

## 2023-04-21 DIAGNOSIS — K922 Gastrointestinal hemorrhage, unspecified: Secondary | ICD-10-CM | POA: Diagnosis not present

## 2023-04-21 DIAGNOSIS — E538 Deficiency of other specified B group vitamins: Secondary | ICD-10-CM | POA: Diagnosis not present

## 2023-04-21 DIAGNOSIS — D500 Iron deficiency anemia secondary to blood loss (chronic): Secondary | ICD-10-CM | POA: Diagnosis not present

## 2023-04-21 DIAGNOSIS — D509 Iron deficiency anemia, unspecified: Secondary | ICD-10-CM

## 2023-04-21 DIAGNOSIS — Z08 Encounter for follow-up examination after completed treatment for malignant neoplasm: Secondary | ICD-10-CM | POA: Diagnosis not present

## 2023-04-21 DIAGNOSIS — Z85110 Personal history of malignant carcinoid tumor of bronchus and lung: Secondary | ICD-10-CM | POA: Diagnosis not present

## 2023-04-21 NOTE — Progress Notes (Signed)
[facility] HEMATOLOGY F/U VISIT  Benetta Spar, MD  REASON FOR REFERRAL: Anemia  SUMMARY OF HEMATOLOGIC HISTORY:    Latest Ref Rng & Units 04/07/2023   10:35 AM 01/27/2023   10:43 AM 12/28/2022    9:05 AM  CBC  WBC 4.0 - 10.5 K/uL 7.2  9.2  8.2   Hemoglobin 12.0 - 15.0 g/dL 8.2  8.3  8.1   Hematocrit 36.0 - 46.0 % 26.6  27.2  27.9   Platelets 150 - 400 K/uL 260  272  302     Lab Results  Component Value Date   IRON 39 04/07/2023   TIBC 346 04/07/2023   FERRITIN 63 04/07/2023     Latest Reference Range & Units 06/21/08 12:17 09/05/10 17:45 09/18/10 09:45 10/16/10 09:45 12/15/10 09:19 02/16/11 10:26 05/31/18 06:09 01/27/23 10:45  Saturation Ratios 10.4 - 31.8 % NOT CALC Not calculated due to Iron <10. Not calculated due to Iron <10. 38 21 21 11  (L) 14 29  (L): Data is abnormally low  HISTORY OF PRESENT ILLNESS: [name] 68 y.o. female referred for anemia.  Patient is accompanied by her legal guardian today.  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  She was referred to [place] by Dr. Candise Che at [facility] for care closer to home and to get IV iron.  Patient recently had a GI bleed which was diagnosed on an endoscopy that showed gastric antral vascular ectasia with bleeding and was treated with argon plasma coagulation [APC].  Patient also reports being on blood thinners for atrial fibrillation and chronic kidney disease both of which can be contributing to anemia as well.  Patient has no complaints today reports occasional fatigue but overall is doing good.  She stated that she received EPO shots before but was stopped by Dr. Wolfgang Phoenix.  She denies any dark-colored stools or abdominal pain.  Appetite is good and no recent loss of weight.  Patient lives in a nursing home.   I have reviewed the past medical  history, past surgical history, social history and family history with the patient    Interval history:  Patient presents back to the for follow-up.  She received 1 dose of Monoferric on 02/23/2023 with good tolerance.  Previously received Feraheme.  She is tolerating iron supplements 325 mg tablets well.   She is here today with her guardian, chance.  Reports feeling increased energy levels following her iron infusion.  Appetite is 75% energy levels are 100%.  Denies pain.  Denies any obvious GI bleeding, melena, hematochezia or bright red blood per rectum.  ALLERGIES:  has no known allergies.  MEDICATIONS:  Current Outpatient Medications  Medication Sig Dispense Refill   acetaminophen (TYLENOL) 325 MG tablet Take 325 mg by mouth every 12 (twelve) hours as needed for moderate pain (pain score 4-6).     albuterol (PROVENTIL HFA;VENTOLIN HFA) 108 (90 Base) MCG/ACT inhaler Inhale 2 puffs into the lungs every 4 (four) hours as needed for shortness of breath.     apixaban (ELIQUIS) 5 MG TABS tablet Take 1 tablet (5 mg total) by mouth 2 (two) times daily. 60 tablet 3   calcitRIOL (ROCALTROL) 0.25 MCG capsule Take 0.25 mcg by mouth every Monday, Wednesday, and Friday.     cetirizine (ZYRTEC) 10 MG tablet Take 10 mg by mouth daily.  Cholecalciferol (VITAMIN D3) 50 MCG (2000 UT) TABS TAKE 1 TABLET BY MOUTH ONCE DAILY. 30 tablet 2   diltiazem (CARDIZEM CD) 240 MG 24 hr capsule Take 1 capsule (240 mg total) by mouth daily. 30 capsule 0   ferrous sulfate 325 (65 FE) MG tablet Take 1 tablet (325 mg total) by mouth every other day. 90 tablet 3   Finerenone (KERENDIA) 10 MG TABS Take 10 mg by mouth daily.     fluticasone (FLONASE) 50 MCG/ACT nasal spray Place 2 sprays into both nostrils daily as needed for allergies.     furosemide (LASIX) 40 MG tablet Take 20 mg by mouth See admin instructions. Take 20 mg twice daily, alternating with 20 mg daily every other day     GERI-TUSSIN DM 10-100 MG/5ML liquid  Take by mouth.     glucose blood (EASYMAX TEST) test strip CHECK BLOOD SUGAR 4 TIMES A DAY. (BREAKFAST, LUNCH, SUPPER & BEDTIME) 200 strip 2   insulin regular human CONCENTRATED (HUMULIN R U-500 KWIKPEN) 500 UNIT/ML KwikPen Inject 30-40 Units into the skin 3 (three) times daily with meals. Inject 40 units with breakfast, 30 units with lunch, and 40 units with supper only if pre-meal glucose is above 90 and only if she eats. 6 mL 2   ipratropium-albuterol (DUONEB) 0.5-2.5 (3) MG/3ML SOLN Take 3 mLs by nebulization every 6 (six) hours as needed (for cough).     isosorbide dinitrate (ISORDIL) 10 MG tablet Take 1 tablet (10 mg total) by mouth 3 (three) times daily. 90 tablet 0   Lancets 28G MISC CHECK BLOOD SUGAR 4 TIMES A DAY. (BREAKFAST, LUNCH, SUPPER, & BEDTIME) 100 each 1   loperamide (IMODIUM) 2 MG capsule Take 2-4 mg by mouth See admin instructions. Take 4 mg by mouth now: then 2 mg after each loose stool as needed, do not exceed 5 doses in 24 hours.     losartan (COZAAR) 25 MG tablet Take 12.5 mg by mouth daily.     metolazone (ZAROXOLYN) 5 MG tablet Take 5 mg by mouth in the morning.     metoprolol tartrate (LOPRESSOR) 50 MG tablet Take 50 mg by mouth 2 (two) times daily.     montelukast (SINGULAIR) 10 MG tablet Take 10 mg by mouth at bedtime.     pantoprazole (PROTONIX) 40 MG tablet Take one bid 60 tablet 0   pantoprazole (PROTONIX) 40 MG tablet Take one daily 30 minutes before breakfast 30 tablet 5   polyethylene glycol powder (GLYCOLAX/MIRALAX) 17 GM/SCOOP powder Take 17 g by mouth daily as needed for mild constipation or moderate constipation.     potassium chloride SA (KLOR-CON M) 20 MEQ tablet Take 20 mEq by mouth daily.     rosuvastatin (CRESTOR) 20 MG tablet TAKE (1) TABLET BY MOUTH ONCE DAILY. (Patient taking differently: Take 10 mg by mouth daily.) 30 tablet 3   TRELEGY ELLIPTA 100-62.5-25 MCG/ACT AEPB Inhale 1 puff into the lungs daily.     triamcinolone cream (KENALOG) 0.5 % Apply 1  application  topically every 12 (twelve) hours as needed (applied to bilateral legs for allergic dermatitis).     No current facility-administered medications for this visit.     REVIEW OF SYSTEMS:   Review of Systems  Constitutional:  Positive for malaise/fatigue.  Gastrointestinal:  Negative for blood in stool.  Genitourinary:  Negative for hematuria.    PHYSICAL EXAMINATION:   Vitals:   04/21/23 1054  BP: 121/70  Pulse: 85  Resp: 18  Temp: 97.7  F (36.5 C)  SpO2: 96%    Physical Exam Constitutional:      General: She is not in acute distress.    Appearance: Normal appearance.  Cardiovascular:     Rate and Rhythm: Normal rate and regular rhythm.  Pulmonary:     Effort: Pulmonary effort is normal.     Breath sounds: Normal breath sounds.  Abdominal:     General: Bowel sounds are normal.     Palpations: Abdomen is soft.  Musculoskeletal:        General: No swelling. Normal range of motion.  Neurological:     Mental Status: She is alert and oriented to person, place, and time. Mental status is at baseline.     LABORATORY DATA:  I have reviewed the data as listed  Lab Results  Component Value Date   WBC 7.2 04/07/2023   NEUTROABS 5.2 04/07/2023   HGB 8.2 (L) 04/07/2023   HCT 26.6 (L) 04/07/2023   MCV 95.7 04/07/2023   PLT 260 04/07/2023      Component Value Date/Time   NA 132 (L) 04/07/2023 1035   NA 136 10/06/2022 0802   K 3.7 04/07/2023 1035   CL 97 (L) 04/07/2023 1035   CO2 26 04/07/2023 1035   GLUCOSE 175 (H) 04/07/2023 1035   BUN 45 (H) 04/07/2023 1035   BUN 53 (H) 10/06/2022 0802   CREATININE 1.97 (H) 04/07/2023 1035   CREATININE 2.39 (H) 01/27/2023 1043   CREATININE 1.57 (H) 09/12/2019 0831   CALCIUM 9.3 04/07/2023 1035   CALCIUM 7.8 (L) 05/31/2018 0609   PROT 7.2 04/07/2023 1035   PROT 6.6 10/06/2022 0802   ALBUMIN 3.5 04/07/2023 1035   ALBUMIN 3.9 10/06/2022 0802   AST 16 04/07/2023 1035   AST 9 (L) 01/27/2023 1043   ALT 12 04/07/2023  1035   ALT 5 01/27/2023 1043   ALKPHOS 64 04/07/2023 1035   BILITOT 0.4 04/07/2023 1035   BILITOT 0.4 01/27/2023 1043   GFRNONAA 27 (L) 04/07/2023 1035   GFRNONAA 22 (L) 01/27/2023 1043   GFRNONAA 35 (L) 09/12/2019 0831   GFRAA 38 05/07/2020 0000   GFRAA 40 (L) 09/12/2019 0831       Chemistry      Component Value Date/Time   NA 132 (L) 04/07/2023 1035   NA 136 10/06/2022 0802   K 3.7 04/07/2023 1035   CL 97 (L) 04/07/2023 1035   CO2 26 04/07/2023 1035   BUN 45 (H) 04/07/2023 1035   BUN 53 (H) 10/06/2022 0802   CREATININE 1.97 (H) 04/07/2023 1035   CREATININE 2.39 (H) 01/27/2023 1043   CREATININE 1.57 (H) 09/12/2019 0831   GLU 169 05/07/2020 0000      Component Value Date/Time   CALCIUM 9.3 04/07/2023 1035   CALCIUM 7.8 (L) 05/31/2018 0609   ALKPHOS 64 04/07/2023 1035   AST 16 04/07/2023 1035   AST 9 (L) 01/27/2023 1043   ALT 12 04/07/2023 1035   ALT 5 01/27/2023 1043   BILITOT 0.4 04/07/2023 1035   BILITOT 0.4 01/27/2023 1043     Endoscopy : 01/19/23:  Impression:  - Normal esophagus.  - A single non- bleeding angiodysplastic lesion in the stomach. Treated with argon plasma coagulation ( APC) .  - Gastric antral vascular ectasia with bleeding. Treated with argon plasma coagulation ( APC) .  - Normal examined duodenum.  ASSESSMENT & PLAN:  Patient is a 68 year old female with multiple comorbidities referred for iron deficiency anemia for IV iron infusions.  1. Iron deficiency anemia due to chronic blood loss (Primary) Etiology likely secondary to chronic GI bleed.  No obvious bleeding per patient. On oral iron supplements 325 mg ferrous sulfate daily. She received 1 dose of Monoferric on 02/23/2023. Repeat labs from 04/07/2023 show hemoglobin of 8.2 (8.3), iron saturation 11% (29%) ferritin of 63 (17). Had upper endoscopy on 01/19/2023 by Dr. Levon Hedger which showed normal esophagus with a single nonbleeding angiodysplastic lesion in the stomach.  This was treated  with argon plasma coagulation.  Had a colonoscopy on 07/08/2020 which showed 5 to 9 mm polyps in the rectum and in the ascending colon removed with a cold snare.  Nonbleeding internal hemorrhoids. She continues to deny any GI bleeding. Recommend 1 additional dose of Monoferric with repeat labs in approximately 10 to 12 weeks. Continue oral iron supplements.  2. Folate deficiency Folate level is low at 5.4.  Recommend folic acid 1 mg supplements daily.  3. Stage 5 chronic kidney disease not on chronic dialysis (HCC) Followed by Dr. Wolfgang Phoenix. She was previously on hemodialysis but no longer needs this. Previously was started on EPO injections although she is currently receiving. We discussed obtaining a prior authorization for Retacrit should she need it in the future.  Although iron storage levels have improved would like to administer 1 more dose and if no improvement of her hemoglobin would recommend initiating Retacrit.  No problem-specific Assessment & Plan notes found for this encounter.  No orders of the defined types were placed in this encounter.  I spent 20 minutes dedicated to the care of this patient (face-to-face and non-face-to-face) on the date of the encounter to include what is described in the assessment and plan.   All questions were answered. The patient knows to call the clinic with any problems, questions or concerns. No barriers to learning was detected.   Mauro Kaufmann, NP 1/22/202512:40 PM

## 2023-04-23 ENCOUNTER — Ambulatory Visit: Payer: Medicare Other | Attending: Nurse Practitioner | Admitting: Nurse Practitioner

## 2023-04-23 ENCOUNTER — Encounter: Payer: Self-pay | Admitting: Nurse Practitioner

## 2023-04-23 VITALS — BP 118/62 | HR 61 | Ht 62.0 in | Wt 246.0 lb

## 2023-04-23 DIAGNOSIS — I10 Essential (primary) hypertension: Secondary | ICD-10-CM | POA: Diagnosis not present

## 2023-04-23 DIAGNOSIS — I480 Paroxysmal atrial fibrillation: Secondary | ICD-10-CM | POA: Diagnosis not present

## 2023-04-23 DIAGNOSIS — I5032 Chronic diastolic (congestive) heart failure: Secondary | ICD-10-CM | POA: Insufficient documentation

## 2023-04-23 DIAGNOSIS — E782 Mixed hyperlipidemia: Secondary | ICD-10-CM | POA: Insufficient documentation

## 2023-04-23 NOTE — Patient Instructions (Signed)
Medication Instructions:  Your physician recommends that you continue on your current medications as directed. Please refer to the Current Medication list given to you today.   Labwork: None  Testing/Procedures: None  Follow-Up: Your physician recommends that you schedule a follow-up appointment in: 3-4 months  Any Other Special Instructions Will Be Listed Below (If Applicable). Thank you for choosing [facility]!      If you need a refill on your cardiac medications before your next appointment, please call your pharmacy.

## 2023-04-23 NOTE — Progress Notes (Unsigned)
Cardiology Office Note:  .   Date:  04/23/2023 ID:  [name], DOB 20-Aug-1955, MRN [id number] PCP: Benetta Spar, MD  [facility] Providers Cardiologist:  Nona Dell, MD    History of Present Illness: [name] Kitchen   [name] is a 68 y.o. female with a PMH of paroxysmal to persistent A-fib, HFpEF, CKD stage IIIb, history of bronchopulmonary carcinoid tumor (followed by oncology), hypertension, COPD, type 2 diabetes, IDA, mixed hyperlipidemia, and mental handicap, who presents today for 33-month follow-up appointment.  Resident at [facility].  Last seen by Dr. Diona Browner on September 08, 2022.  Activity was limited by arthritis along her knees.  She was overall doing very well.  Today she presents for 62-month follow-up appointment with her guardian.  Difficult historian d/t mental handicap. She admits to palpitations/what sounds like episodes of A-fib every once in a while that are "painful", says sometimes it lasts a few hours and is resolved the next day. Denies any shortness of breath, syncope, presyncope, dizziness, orthopnea, PND, swelling or significant weight changes, acute bleeding, or claudication.   ROS: Negative.  See HPI.  Studies Reviewed: [name] Kitchen    EKG: EKG Interpretation Date/Time:  Friday April 23 2023 10:25:47 EST Ventricular Rate:  60 PR Interval:    QRS Duration:  98 QT Interval:  412 QTC Calculation: 412 R Axis:   95  Text Interpretation: Atrial fibrillation Rightward axis Nonspecific ST abnormality When compared with ECG of 13-Dec-2020 08:03, Questionable change in QRS axis QT has shortened Confirmed by Sharlene Dory [phone]) on 04/23/2023 10:47:26 AM   TEE 04/2018:  IMPRESSIONS    1. Calcified, heterogeneous, primarily immobile lesion on the left  coronary cusp, appears to be primarily on the ventricular aspect of the  left coronary cusp. In clip 33 where it is well seen it measures 0.98 cm x 0.56 cm. The lesion has the appearance of a  subacute or healed vegetation. Differential also includes calcified nodule in the setting of chronic dialysis.   2. The left ventricle has normal systolic function, with an ejection  fraction of 60-65%. The cavity size was normal.   3. The right ventricle has normal systolc function. The cavity was  normal. There is no increase in right ventricular wall thickness.   4. The aortic valve is tricuspid. Aortic valve regurgitation is trivial  by color flow Doppler.   5. The tricuspid valve was normal in structure.   6. The pulmonic valve was normal in structure.   7. There is evidence of severe atherosclerotic plaque in the aortic arch and descending aorta.   8. Tiny patent foramen ovale with left to right shunting across the  atrial septum by color flow Doppler.   9. The interatrial septum appears to be lipomatous.  10. No left atrial appendage thrombus.  Echo 04/2018: 1. The left ventricle has hyperdynamic systolic function, with an  ejection fraction of >65%. The cavity size was normal. Left ventricular  diastolic Doppler parameters are consistent with impaired relaxation.   2. The right ventricle has normal systolic function. The cavity was  normal. There is no increase in right ventricular wall thickness.   3. The mitral valve is normal in structure.   4. The tricuspid valve is normal in structure.   5. The aortic valve is tricuspid Mild thickening of the aortic valve Mild  calcification of the aortic valve.   6. There is a small mobile density (1.1 x 0.5cm) on the ventricular  surface  of the aortic valve. Challenging to visualize. Consider TEE if  clinically warranted.   7. The pulmonic valve was normal in structure.  Physical Exam:   VS:  BP 118/62   Pulse 61   Ht 5\' 2"  (1.575 m)   Wt 246 lb (111.6 kg)   SpO2 100%   BMI 44.99 kg/m    Wt Readings from Last 3 Encounters:  04/23/23 246 lb (111.6 kg)  04/21/23 224 lb (101.6 kg)  03/04/23 241 lb 9.6 oz (109.6 kg)    GEN:  Morbidly obese, 68 y.o. female in no acute distress NECK: No JVD; No carotid bruits CARDIAC: S1/S2, irregularly irregular rhythm, no murmurs, rubs, gallops RESPIRATORY:  Clear to auscultation without rales, wheezing or rhonchi  ABDOMEN: Soft, non-tender, non-distended EXTREMITIES:  No edema; No deformity   ASSESSMENT AND PLAN: .    A-fib Admits to intermittent palpitations/episodes of A-fib that occur occasionally, says is not often.  She is in A-fib today, heart rate is well-controlled.  Discussed monitoring with patient, and came to shared medical decision that we will hold off at this time.  Discussed antiarrhythmic options, however came to shared medical decision that we will hold off at this time due to patient's history of seeing oncology at this time. Continue current medication regimen. Recommended to log her symptoms. Heart healthy diet and regular cardiovascular exercise encouraged.  Not felt to be a good surgical candidate due to mental handicap.   HFpEF Stage C, NYHA class I symptoms. EF 60-65%. Euvolemic and well compensated on exam. No medication changes at this time. Low sodium diet, fluid restriction <2L, and daily weights encouraged. Educated to contact our office for weight gain of 2 lbs overnight or 5 lbs in one week.  HTN BP stable. Discussed to monitor BP at home at least 2 hours after medications and sitting for 5-10 minutes.  No medication changes at this time. Heart healthy diet and regular cardiovascular exercise encouraged.   Mixed HLD LDL 42 last year. LDL at goal. No medication changes at this time. Heart healthy diet and regular cardiovascular exercise encouraged.   Morbid obesity Weight loss via diet and exercise encouraged. Discussed the impact being overweight would have on cardiovascular risk.   Dispo: Follow-up with me/APP in 3-4 months or sooner if anything changes.   Signed, Sharlene Dory, NP

## 2023-04-27 ENCOUNTER — Other Ambulatory Visit: Payer: Self-pay

## 2023-04-27 ENCOUNTER — Other Ambulatory Visit: Payer: Self-pay | Admitting: Oncology

## 2023-04-27 MED ORDER — FOLIC ACID 1 MG PO TABS: 1.0000 mg | ORAL_TABLET | Freq: Every day | ORAL | 2 refills | Status: DC

## 2023-04-27 MED ORDER — FOLIC ACID 1 MG PO TABS: 1.0000 mg | ORAL_TABLET | Freq: Every day | ORAL | 2 refills | Status: AC

## 2023-04-27 NOTE — Telephone Encounter (Signed)
[name] with [facility] called needing order for folic acid to be sent to her facility.   Script faxed to [phone]

## 2023-05-06 ENCOUNTER — Inpatient Hospital Stay: Payer: Medicare Other | Attending: Oncology

## 2023-05-06 VITALS — BP 105/47 | HR 61 | Temp 97.5°F | Resp 16 | Ht 62.5 in | Wt 245.0 lb

## 2023-05-06 DIAGNOSIS — N185 Chronic kidney disease, stage 5: Secondary | ICD-10-CM | POA: Diagnosis not present

## 2023-05-06 DIAGNOSIS — D500 Iron deficiency anemia secondary to blood loss (chronic): Secondary | ICD-10-CM | POA: Diagnosis not present

## 2023-05-06 DIAGNOSIS — K922 Gastrointestinal hemorrhage, unspecified: Secondary | ICD-10-CM | POA: Insufficient documentation

## 2023-05-06 MED ORDER — SODIUM CHLORIDE 0.9 % IV SOLN
1000.0000 mg | Freq: Once | INTRAVENOUS | Status: AC
  Administered 2023-05-06: 1000 mg via INTRAVENOUS
  Filled 2023-05-06: qty 1000

## 2023-05-06 MED ORDER — SODIUM CHLORIDE 0.9 % IV SOLN
Freq: Once | INTRAVENOUS | Status: AC
Start: 2023-05-06 — End: 2023-05-06

## 2023-05-06 MED ORDER — ACETAMINOPHEN 325 MG PO TABS
650.0000 mg | ORAL_TABLET | Freq: Once | ORAL | Status: AC
  Administered 2023-05-06: 650 mg via ORAL
  Filled 2023-05-06: qty 2

## 2023-05-06 MED ORDER — CETIRIZINE HCL 10 MG/ML IV SOLN
10.0000 mg | Freq: Once | INTRAVENOUS | Status: AC
  Administered 2023-05-06: 10 mg via INTRAVENOUS
  Filled 2023-05-06: qty 1

## 2023-05-06 NOTE — Progress Notes (Signed)
 Patient tolerated iron infusion with no complaints voiced.  Peripheral IV site clean and dry with good blood return noted before and after infusion.  Band aid applied. Pt observed for 30 minutes post iron without any complications.  VSS with discharge and left in satisfactory condition with no s/s of distress noted. All follow ups as scheduled.   Sherika Kubicki

## 2023-05-06 NOTE — Patient Instructions (Signed)

## 2023-05-17 DIAGNOSIS — B351 Tinea unguium: Secondary | ICD-10-CM | POA: Diagnosis not present

## 2023-05-17 DIAGNOSIS — M79674 Pain in right toe(s): Secondary | ICD-10-CM | POA: Diagnosis not present

## 2023-05-17 DIAGNOSIS — M79675 Pain in left toe(s): Secondary | ICD-10-CM | POA: Diagnosis not present

## 2023-05-18 DIAGNOSIS — N184 Chronic kidney disease, stage 4 (severe): Secondary | ICD-10-CM | POA: Diagnosis not present

## 2023-05-18 DIAGNOSIS — I5032 Chronic diastolic (congestive) heart failure: Secondary | ICD-10-CM | POA: Diagnosis not present

## 2023-06-04 ENCOUNTER — Encounter: Payer: Self-pay | Admitting: *Deleted

## 2023-06-15 DIAGNOSIS — N184 Chronic kidney disease, stage 4 (severe): Secondary | ICD-10-CM | POA: Diagnosis not present

## 2023-06-15 DIAGNOSIS — I5032 Chronic diastolic (congestive) heart failure: Secondary | ICD-10-CM | POA: Diagnosis not present

## 2023-06-21 ENCOUNTER — Inpatient Hospital Stay: Payer: Medicare Other | Attending: Oncology

## 2023-06-21 DIAGNOSIS — D509 Iron deficiency anemia, unspecified: Secondary | ICD-10-CM | POA: Insufficient documentation

## 2023-06-21 DIAGNOSIS — N185 Chronic kidney disease, stage 5: Secondary | ICD-10-CM | POA: Diagnosis not present

## 2023-06-21 DIAGNOSIS — Z79899 Other long term (current) drug therapy: Secondary | ICD-10-CM | POA: Diagnosis not present

## 2023-06-21 DIAGNOSIS — Z7901 Long term (current) use of anticoagulants: Secondary | ICD-10-CM | POA: Diagnosis not present

## 2023-06-21 DIAGNOSIS — D3A090 Benign carcinoid tumor of the bronchus and lung: Secondary | ICD-10-CM | POA: Diagnosis not present

## 2023-06-21 LAB — CBC WITH DIFFERENTIAL/PLATELET
Abs Immature Granulocytes: 0.07 10*3/uL (ref 0.00–0.07)
Basophils Absolute: 0.1 10*3/uL (ref 0.0–0.1)
Basophils Relative: 1 %
Eosinophils Absolute: 0.3 10*3/uL (ref 0.0–0.5)
Eosinophils Relative: 4 %
HCT: 30.9 % — ABNORMAL LOW (ref 36.0–46.0)
Hemoglobin: 9.8 g/dL — ABNORMAL LOW (ref 12.0–15.0)
Immature Granulocytes: 1 %
Lymphocytes Relative: 13 %
Lymphs Abs: 1 10*3/uL (ref 0.7–4.0)
MCH: 29.3 pg (ref 26.0–34.0)
MCHC: 31.7 g/dL (ref 30.0–36.0)
MCV: 92.5 fL (ref 80.0–100.0)
Monocytes Absolute: 0.7 10*3/uL (ref 0.1–1.0)
Monocytes Relative: 9 %
Neutro Abs: 5.7 10*3/uL (ref 1.7–7.7)
Neutrophils Relative %: 72 %
Platelets: 314 10*3/uL (ref 150–400)
RBC: 3.34 MIL/uL — ABNORMAL LOW (ref 3.87–5.11)
RDW: 14.8 % (ref 11.5–15.5)
WBC: 7.8 10*3/uL (ref 4.0–10.5)
nRBC: 0 % (ref 0.0–0.2)

## 2023-06-21 LAB — COMPREHENSIVE METABOLIC PANEL
ALT: 13 U/L (ref 0–44)
AST: 19 U/L (ref 15–41)
Albumin: 3.5 g/dL (ref 3.5–5.0)
Alkaline Phosphatase: 62 U/L (ref 38–126)
Anion gap: 12 (ref 5–15)
BUN: 53 mg/dL — ABNORMAL HIGH (ref 8–23)
CO2: 24 mmol/L (ref 22–32)
Calcium: 9.5 mg/dL (ref 8.9–10.3)
Chloride: 98 mmol/L (ref 98–111)
Creatinine, Ser: 1.93 mg/dL — ABNORMAL HIGH (ref 0.44–1.00)
GFR, Estimated: 28 mL/min — ABNORMAL LOW (ref >60)
Glucose, Bld: 144 mg/dL — ABNORMAL HIGH (ref 70–99)
Potassium: 3.7 mmol/L (ref 3.5–5.1)
Sodium: 134 mmol/L — ABNORMAL LOW (ref 135–145)
Total Bilirubin: 0.2 mg/dL (ref 0.0–1.2)
Total Protein: 7.8 g/dL (ref 6.5–8.1)

## 2023-06-21 LAB — IRON AND TIBC
Iron: 73 ug/dL (ref 28–170)
Saturation Ratios: 26 % (ref 10.4–31.8)
TIBC: 280 ug/dL (ref 250–450)
UIBC: 207 ug/dL

## 2023-06-21 LAB — FERRITIN: Ferritin: 170 ng/mL (ref 11–307)

## 2023-06-21 LAB — FOLATE: Folate: >40 ng/mL (ref >5.9)

## 2023-06-28 ENCOUNTER — Inpatient Hospital Stay

## 2023-06-28 ENCOUNTER — Inpatient Hospital Stay (HOSPITAL_BASED_OUTPATIENT_CLINIC_OR_DEPARTMENT_OTHER): Payer: Medicare Other | Admitting: Oncology

## 2023-06-28 VITALS — BP 112/57 | HR 60 | Temp 98.0°F | Resp 18

## 2023-06-28 DIAGNOSIS — N184 Chronic kidney disease, stage 4 (severe): Secondary | ICD-10-CM | POA: Diagnosis not present

## 2023-06-28 DIAGNOSIS — N185 Chronic kidney disease, stage 5: Secondary | ICD-10-CM

## 2023-06-28 DIAGNOSIS — Z7901 Long term (current) use of anticoagulants: Secondary | ICD-10-CM | POA: Diagnosis not present

## 2023-06-28 DIAGNOSIS — D509 Iron deficiency anemia, unspecified: Secondary | ICD-10-CM | POA: Diagnosis not present

## 2023-06-28 DIAGNOSIS — D631 Anemia in chronic kidney disease: Secondary | ICD-10-CM | POA: Diagnosis not present

## 2023-06-28 DIAGNOSIS — D3A090 Benign carcinoid tumor of the bronchus and lung: Secondary | ICD-10-CM

## 2023-06-28 DIAGNOSIS — Z79899 Other long term (current) drug therapy: Secondary | ICD-10-CM | POA: Diagnosis not present

## 2023-06-28 LAB — CBC WITH DIFFERENTIAL/PLATELET
Abs Immature Granulocytes: 0.03 10*3/uL (ref 0.00–0.07)
Basophils Absolute: 0.1 10*3/uL (ref 0.0–0.1)
Basophils Relative: 1 %
Eosinophils Absolute: 0.3 10*3/uL (ref 0.0–0.5)
Eosinophils Relative: 4 %
HCT: 27.9 % — ABNORMAL LOW (ref 36.0–46.0)
Hemoglobin: 9.1 g/dL — ABNORMAL LOW (ref 12.0–15.0)
Immature Granulocytes: 0 %
Lymphocytes Relative: 13 %
Lymphs Abs: 1.1 10*3/uL (ref 0.7–4.0)
MCH: 30.3 pg (ref 26.0–34.0)
MCHC: 32.6 g/dL (ref 30.0–36.0)
MCV: 93 fL (ref 80.0–100.0)
Monocytes Absolute: 0.6 10*3/uL (ref 0.1–1.0)
Monocytes Relative: 8 %
Neutro Abs: 5.8 10*3/uL (ref 1.7–7.7)
Neutrophils Relative %: 74 %
Platelets: 222 10*3/uL (ref 150–400)
RBC: 3 MIL/uL — ABNORMAL LOW (ref 3.87–5.11)
RDW: 15.2 % (ref 11.5–15.5)
WBC: 7.9 10*3/uL (ref 4.0–10.5)
nRBC: 0 % (ref 0.0–0.2)

## 2023-06-28 LAB — VITAMIN B12: Vitamin B-12: 510 pg/mL (ref 180–914)

## 2023-06-28 NOTE — Patient Instructions (Signed)
 VISIT SUMMARY:  You came in today because you have been feeling tired. You mentioned that you have been taking iron supplements, which have helped a bit, but you still feel fatigued. You have a history of anemia and kidney disease, which might be making your anemia worse. You also have COPD and a lung nodule, and you haven't seen your lung doctor, Dr. Wolfgang Phoenix, in several months.  YOUR PLAN:  -ANEMIA: Anemia is a condition where you don't have enough healthy red blood cells to carry oxygen to your body's tissues. Your hemoglobin level has improved with iron supplements but is still low. We will do a blood test to rule out multiple myeloma, a type of cancer. If your hemoglobin remains low, we may start erythropoietin injections to help your body make more red blood cells. These injections can have side effects like increased blood pressure and risk of blood clots, but they may help improve your symptoms.  -CHRONIC KIDNEY DISEASE: Chronic kidney disease means your kidneys are damaged and can't filter blood as well as they should. This can contribute to anemia because your kidneys produce less erythropoietin, a hormone that helps make red blood cells.  -GASTROINTESTINAL BLEEDING (RESOLVED): Your previous gastrointestinal bleeding has resolved, and your iron levels are stable, indicating no current bleeding. Continue to follow up with Dr. Levon Hedger for ongoing management.  INSTRUCTIONS:  1. Schedule a follow-up appointment with Dr. Wolfgang Phoenix. 2. Get a blood test to rule out multiple myeloma. 3. Repeat labs in one month. 4. Follow up in one month to review lab results and discuss further treatment options if needed.

## 2023-06-28 NOTE — Assessment & Plan Note (Signed)
 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  She is asymptomatic currently. -Continue to monitor

## 2023-06-28 NOTE — Assessment & Plan Note (Signed)
 Patient has chronic anemia likely secondary to iron deficiency and chronic kidney disease.  Iron levels improved with IV iron with slight improvement in hemoglobin.  Patient reports improvement in fatigue. -Will order labs today to rule out multiple myeloma and obtain a baseline EPO level -If patient has no significant improvement in hemoglobin and if it stays below 10, will start on ESA shots.  Return to clinic in 1 month with labs to discuss further management.

## 2023-06-28 NOTE — Progress Notes (Signed)
 [facility] at [facility]  HEMATOLOGY FOLLOW-UP VISIT  [name], Anne Salinas, MD  REASON FOR FOLLOW-UP: Iron deficiency anemia  ASSESSMENT & PLAN:  Patient is a 68 year old female following for iron deficiency anemia.    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  She is asymptomatic currently. -Continue to monitor  IDA (iron deficiency anemia) Patient has chronic anemia likely secondary to iron deficiency and chronic kidney disease.  Iron levels improved with IV iron with slight improvement in hemoglobin.  Patient reports improvement in fatigue. -Will order labs today to rule out multiple myeloma and obtain a baseline EPO level -If patient has no significant improvement in hemoglobin and if it stays below 10, will start on ESA shots.  Return to clinic in 1 month with labs to discuss further management.  CKD (chronic kidney disease) Patient has a history of CKD following with Dr. Wolfgang Phoenix.  Was previously on EPO shots for anemia. -Continue to follow with Dr. Wolfgang Phoenix -If anemia does not correct with iron replacement, will consider EPO shots   Orders Placed This Encounter  Procedures   CBC with Differential/Platelet    Standing Status:   Future    Number of Occurrences:   1    Expected Date:   06/28/2023    Expiration Date:   06/27/2024   Kappa/lambda light chains    Standing Status:   Future    Number of Occurrences:   1    Expected Date:   06/28/2023    Expiration Date:   06/27/2024   Multiple Myeloma Panel (SPEP&IFE w/QIG)    Standing Status:   Future    Number of Occurrences:   1    Expected Date:   06/28/2023    Expiration Date:   06/27/2024   Vitamin B12    Standing Status:   Future    Number of Occurrences:   1    Expected Date:   06/28/2023    Expiration Date:   06/27/2024   Erythropoietin    Standing Status:   Future    Number of Occurrences:   1    Expected Date:   06/28/2023    Expiration  Date:   06/27/2024    The total time spent in the appointment was 20 minutes encounter with patients including review of chart and various tests results, discussions about plan of care and coordination of care plan   All questions were answered. The patient knows to call the clinic with any problems, questions or concerns. No barriers to learning was detected.  Cindie Crumbly, MD 3/31/20252:06 PM   SUMMARY OF HEMATOLOGIC HISTORY: -Iron deficiency anemia likely secondary to GI bleeding -S/p IV Monoferric 1000 mg on 02/23/2023 and 05/06/2023.   INTERVAL HISTORY: [name] 68 y.o. female following for iron deficiency anemia.  Patient is accompanied by her guardian today.  She reports ongoing fatigue, but has some improvement since starting iron supplementation.  She sees Dr. Wolfgang Phoenix for chronic kidney disease.  She has no other complaints today.  She denies melena, hematochezia, weight loss, loss of appetite, fevers, chills.  Overall feeling well.   I have reviewed the past medical history, past surgical history, social history and family history with the patient   ALLERGIES:  has no known allergies.  MEDICATIONS:  Current Outpatient Medications  Medication Sig Dispense Refill   acetaminophen (TYLENOL) 325 MG tablet Take 325 mg by mouth every 12 (twelve) hours as needed for  moderate pain (pain score 4-6).     albuterol (PROVENTIL HFA;VENTOLIN HFA) 108 (90 Base) MCG/ACT inhaler Inhale 2 puffs into the lungs every 4 (four) hours as needed for shortness of breath.     apixaban (ELIQUIS) 5 MG TABS tablet Take 1 tablet (5 mg total) by mouth 2 (two) times daily. 60 tablet 3   calcitRIOL (ROCALTROL) 0.25 MCG capsule Take 0.25 mcg by mouth every Monday, Wednesday, and Friday.     cetirizine (ZYRTEC) 10 MG tablet Take 10 mg by mouth daily.     Cholecalciferol (VITAMIN D3) 50 MCG (2000 UT) TABS TAKE 1 TABLET BY MOUTH ONCE DAILY. 30 tablet 2   diltiazem (CARDIZEM CD) 240 MG 24 hr capsule Take  1 capsule (240 mg total) by mouth daily. 30 capsule 0   DROPSAFE SAFETY PEN NEEDLES 31G X 6 MM MISC      ferrous sulfate 325 (65 FE) MG tablet Take 1 tablet (325 mg total) by mouth every other day. 90 tablet 3   Finerenone (KERENDIA) 10 MG TABS Take 10 mg by mouth daily.     fluticasone (FLONASE) 50 MCG/ACT nasal spray Place 2 sprays into both nostrils daily as needed for allergies.     folic acid (FOLVITE) 1 MG tablet Take 1 tablet (1 mg total) by mouth daily. 60 tablet 2   furosemide (LASIX) 40 MG tablet Take 20 mg by mouth See admin instructions. Take 20 mg twice daily, alternating with 20 mg daily every other day     GERI-TUSSIN DM 10-100 MG/5ML liquid Take by mouth.     glucose blood (EASYMAX TEST) test strip CHECK BLOOD SUGAR 4 TIMES A DAY. (BREAKFAST, LUNCH, SUPPER & BEDTIME) 200 strip 2   hydrOXYzine (ATARAX) 10 MG tablet Take 10 mg by mouth 3 (three) times daily.     insulin regular human CONCENTRATED (HUMULIN R U-500 KWIKPEN) 500 UNIT/ML KwikPen Inject 30-40 Units into the skin 3 (three) times daily with meals. Inject 40 units with breakfast, 30 units with lunch, and 40 units with supper only if pre-meal glucose is above 90 and only if she eats. 6 mL 2   ipratropium-albuterol (DUONEB) 0.5-2.5 (3) MG/3ML SOLN Take 3 mLs by nebulization every 6 (six) hours as needed (for cough).     isosorbide dinitrate (ISORDIL) 10 MG tablet Take 1 tablet (10 mg total) by mouth 3 (three) times daily. 90 tablet 0   Lancets 28G MISC CHECK BLOOD SUGAR 4 TIMES A DAY. (BREAKFAST, LUNCH, SUPPER, & BEDTIME) 100 each 1   loperamide (IMODIUM) 2 MG capsule Take 2-4 mg by mouth See admin instructions. Take 4 mg by mouth now: then 2 mg after each loose stool as needed, do not exceed 5 doses in 24 hours.     losartan (COZAAR) 25 MG tablet Take 12.5 mg by mouth daily.     metolazone (ZAROXOLYN) 5 MG tablet Take 5 mg by mouth in the morning.     metoprolol tartrate (LOPRESSOR) 50 MG tablet Take 50 mg by mouth 2 (two)  times daily.     montelukast (SINGULAIR) 10 MG tablet Take 10 mg by mouth at bedtime.     pantoprazole (PROTONIX) 40 MG tablet Take one bid 60 tablet 0   pantoprazole (PROTONIX) 40 MG tablet Take one daily 30 minutes before breakfast 30 tablet 5   polyethylene glycol powder (GLYCOLAX/MIRALAX) 17 GM/SCOOP powder Take 17 g by mouth daily as needed for mild constipation or moderate constipation.     potassium chloride SA (  KLOR-CON M) 20 MEQ tablet Take 20 mEq by mouth daily.     rosuvastatin (CRESTOR) 20 MG tablet TAKE (1) TABLET BY MOUTH ONCE DAILY. (Patient taking differently: Take 10 mg by mouth daily.) 30 tablet 3   TRELEGY ELLIPTA 100-62.5-25 MCG/ACT AEPB Inhale 1 puff into the lungs daily.     triamcinolone cream (KENALOG) 0.5 % Apply 1 application  topically every 12 (twelve) hours as needed (applied to bilateral legs for allergic dermatitis).     No current facility-administered medications for this visit.     REVIEW OF SYSTEMS:   Constitutional: Denies fevers, chills or night sweats Eyes: Denies blurriness of vision Ears, nose, mouth, throat, and face: Denies mucositis or sore throat Respiratory: Denies cough, dyspnea or wheezes Cardiovascular: Denies palpitation, chest discomfort or lower extremity swelling Gastrointestinal:  Denies nausea, heartburn or change in bowel habits Skin: Denies abnormal skin rashes Lymphatics: Denies new lymphadenopathy or easy bruising Neurological:Denies numbness, tingling or new weaknesses Behavioral/Psych: Mood is stable, no new changes  All other systems were reviewed with the patient and are negative.  PHYSICAL EXAMINATION:   Vitals:   06/28/23 1253  BP: (!) 112/57  Pulse: 60  Resp: 18  Temp: 98 F (36.7 C)  SpO2: 100%    GENERAL:alert, no distress and comfortable LUNGS: clear to auscultation and percussion with normal breathing effort HEART: regular rate & rhythm and no murmurs and no lower extremity edema ABDOMEN:abdomen soft,  non-tender and normal bowel sounds Musculoskeletal:no cyanosis of digits and no clubbing  NEURO: alert & oriented x 3 with fluent speech  LABORATORY DATA:  I have reviewed the data as listed  Lab Results  Component Value Date   WBC 7.9 06/28/2023   NEUTROABS 5.8 06/28/2023   HGB 9.1 (L) 06/28/2023   HCT 27.9 (L) 06/28/2023   MCV 93.0 06/28/2023   PLT 222 06/28/2023       Chemistry      Component Value Date/Time   NA 134 (L) 06/21/2023 1021   NA 136 10/06/2022 0802   K 3.7 06/21/2023 1021   CL 98 06/21/2023 1021   CO2 24 06/21/2023 1021   BUN 53 (H) 06/21/2023 1021   BUN 53 (H) 10/06/2022 0802   CREATININE 1.93 (H) 06/21/2023 1021   CREATININE 2.39 (H) 01/27/2023 1043   CREATININE 1.57 (H) 09/12/2019 0831   GLU 169 05/07/2020 0000      Component Value Date/Time   CALCIUM 9.5 06/21/2023 1021   CALCIUM 7.8 (L) 05/31/2018 0609   ALKPHOS 62 06/21/2023 1021   AST 19 06/21/2023 1021   AST 9 (L) 01/27/2023 1043   ALT 13 06/21/2023 1021   ALT 5 01/27/2023 1043   BILITOT 0.2 06/21/2023 1021   BILITOT 0.4 01/27/2023 1043      Latest Reference Range & Units 06/21/23 10:21  Iron 28 - 170 ug/dL 73  UIBC ug/dL 782  TIBC 956 - 213 ug/dL 086  Saturation Ratios 10.4 - 31.8 % 26  Ferritin 11 - 307 ng/mL 170  Folate >5.9 ng/mL >40.0

## 2023-06-28 NOTE — Assessment & Plan Note (Signed)
 Patient has a history of CKD following with Dr. Wolfgang Phoenix.  Was previously on EPO shots for anemia. -Continue to follow with Dr. Wolfgang Phoenix -If anemia does not correct with iron replacement, will consider EPO shots

## 2023-06-29 LAB — KAPPA/LAMBDA LIGHT CHAINS
Kappa free light chain: 80.4 mg/L — ABNORMAL HIGH (ref 3.3–19.4)
Kappa, lambda light chain ratio: 1.52 (ref 0.26–1.65)
Lambda free light chains: 53 mg/L — ABNORMAL HIGH (ref 5.7–26.3)

## 2023-06-29 LAB — ERYTHROPOIETIN: Erythropoietin: 38.5 m[IU]/mL — ABNORMAL HIGH (ref 2.6–18.5)

## 2023-07-02 LAB — MULTIPLE MYELOMA PANEL, SERUM
Albumin SerPl Elph-Mcnc: 3 g/dL (ref 2.9–4.4)
Albumin/Glob SerPl: 0.9 (ref 0.7–1.7)
Alpha 1: 0.4 g/dL (ref 0.0–0.4)
Alpha2 Glob SerPl Elph-Mcnc: 1 g/dL (ref 0.4–1.0)
B-Globulin SerPl Elph-Mcnc: 0.9 g/dL (ref 0.7–1.3)
Gamma Glob SerPl Elph-Mcnc: 1.2 g/dL (ref 0.4–1.8)
Globulin, Total: 3.4 g/dL (ref 2.2–3.9)
IgA: 292 mg/dL (ref 87–352)
IgG (Immunoglobin G), Serum: 1203 mg/dL (ref 586–1602)
IgM (Immunoglobulin M), Srm: 38 mg/dL (ref 26–217)
Total Protein ELP: 6.4 g/dL (ref 6.0–8.5)

## 2023-07-06 DIAGNOSIS — N189 Chronic kidney disease, unspecified: Secondary | ICD-10-CM | POA: Diagnosis not present

## 2023-07-06 DIAGNOSIS — R809 Proteinuria, unspecified: Secondary | ICD-10-CM | POA: Diagnosis not present

## 2023-07-06 DIAGNOSIS — D631 Anemia in chronic kidney disease: Secondary | ICD-10-CM | POA: Diagnosis not present

## 2023-07-07 ENCOUNTER — Ambulatory Visit: Payer: Medicare Other | Admitting: "Endocrinology

## 2023-07-15 ENCOUNTER — Other Ambulatory Visit: Payer: Self-pay

## 2023-07-15 DIAGNOSIS — D631 Anemia in chronic kidney disease: Secondary | ICD-10-CM

## 2023-07-15 DIAGNOSIS — E538 Deficiency of other specified B group vitamins: Secondary | ICD-10-CM

## 2023-07-15 DIAGNOSIS — D500 Iron deficiency anemia secondary to blood loss (chronic): Secondary | ICD-10-CM

## 2023-07-16 DIAGNOSIS — I5032 Chronic diastolic (congestive) heart failure: Secondary | ICD-10-CM | POA: Diagnosis not present

## 2023-07-16 DIAGNOSIS — N184 Chronic kidney disease, stage 4 (severe): Secondary | ICD-10-CM | POA: Diagnosis not present

## 2023-07-19 ENCOUNTER — Inpatient Hospital Stay: Attending: Oncology

## 2023-07-19 DIAGNOSIS — D500 Iron deficiency anemia secondary to blood loss (chronic): Secondary | ICD-10-CM

## 2023-07-19 DIAGNOSIS — E538 Deficiency of other specified B group vitamins: Secondary | ICD-10-CM

## 2023-07-19 DIAGNOSIS — D509 Iron deficiency anemia, unspecified: Secondary | ICD-10-CM | POA: Diagnosis not present

## 2023-07-19 DIAGNOSIS — D631 Anemia in chronic kidney disease: Secondary | ICD-10-CM

## 2023-07-19 DIAGNOSIS — D3A090 Benign carcinoid tumor of the bronchus and lung: Secondary | ICD-10-CM | POA: Insufficient documentation

## 2023-07-19 DIAGNOSIS — N189 Chronic kidney disease, unspecified: Secondary | ICD-10-CM | POA: Diagnosis not present

## 2023-07-19 LAB — COMPREHENSIVE METABOLIC PANEL WITH GFR
ALT: 11 U/L (ref 0–44)
AST: 14 U/L — ABNORMAL LOW (ref 15–41)
Albumin: 3.4 g/dL — ABNORMAL LOW (ref 3.5–5.0)
Alkaline Phosphatase: 74 U/L (ref 38–126)
Anion gap: 11 (ref 5–15)
BUN: 44 mg/dL — ABNORMAL HIGH (ref 8–23)
CO2: 28 mmol/L (ref 22–32)
Calcium: 9.2 mg/dL (ref 8.9–10.3)
Chloride: 98 mmol/L (ref 98–111)
Creatinine, Ser: 1.74 mg/dL — ABNORMAL HIGH (ref 0.44–1.00)
GFR, Estimated: 32 mL/min — ABNORMAL LOW (ref >60)
Glucose, Bld: 71 mg/dL (ref 70–99)
Potassium: 3.6 mmol/L (ref 3.5–5.1)
Sodium: 137 mmol/L (ref 135–145)
Total Bilirubin: 0.6 mg/dL (ref 0.0–1.2)
Total Protein: 7.5 g/dL (ref 6.5–8.1)

## 2023-07-19 LAB — CBC WITH DIFFERENTIAL/PLATELET
Abs Immature Granulocytes: 0.05 10*3/uL (ref 0.00–0.07)
Basophils Absolute: 0.1 10*3/uL (ref 0.0–0.1)
Basophils Relative: 1 %
Eosinophils Absolute: 0.3 10*3/uL (ref 0.0–0.5)
Eosinophils Relative: 4 %
HCT: 29.6 % — ABNORMAL LOW (ref 36.0–46.0)
Hemoglobin: 9.1 g/dL — ABNORMAL LOW (ref 12.0–15.0)
Immature Granulocytes: 1 %
Lymphocytes Relative: 14 %
Lymphs Abs: 1.1 10*3/uL (ref 0.7–4.0)
MCH: 29.4 pg (ref 26.0–34.0)
MCHC: 30.7 g/dL (ref 30.0–36.0)
MCV: 95.5 fL (ref 80.0–100.0)
Monocytes Absolute: 1 10*3/uL (ref 0.1–1.0)
Monocytes Relative: 12 %
Neutro Abs: 5.4 10*3/uL (ref 1.7–7.7)
Neutrophils Relative %: 68 %
Platelets: 327 10*3/uL (ref 150–400)
RBC: 3.1 MIL/uL — ABNORMAL LOW (ref 3.87–5.11)
RDW: 15.4 % (ref 11.5–15.5)
WBC: 7.9 10*3/uL (ref 4.0–10.5)
nRBC: 0 % (ref 0.0–0.2)

## 2023-07-19 LAB — IRON AND TIBC
Iron: 51 ug/dL (ref 28–170)
Saturation Ratios: 16 % (ref 10.4–31.8)
TIBC: 320 ug/dL (ref 250–450)
UIBC: 269 ug/dL

## 2023-07-19 LAB — FERRITIN: Ferritin: 85 ng/mL (ref 11–307)

## 2023-07-19 LAB — VITAMIN B12: Vitamin B-12: 440 pg/mL (ref 180–914)

## 2023-07-19 LAB — FOLATE: Folate: 36.7 ng/mL (ref >5.9)

## 2023-07-26 ENCOUNTER — Inpatient Hospital Stay (HOSPITAL_BASED_OUTPATIENT_CLINIC_OR_DEPARTMENT_OTHER): Admitting: Oncology

## 2023-07-26 VITALS — BP 111/65 | HR 63 | Temp 97.7°F | Resp 19 | Ht 62.0 in | Wt 242.0 lb

## 2023-07-26 DIAGNOSIS — D649 Anemia, unspecified: Secondary | ICD-10-CM | POA: Diagnosis not present

## 2023-07-26 DIAGNOSIS — N189 Chronic kidney disease, unspecified: Secondary | ICD-10-CM | POA: Diagnosis not present

## 2023-07-26 DIAGNOSIS — D3A090 Benign carcinoid tumor of the bronchus and lung: Secondary | ICD-10-CM | POA: Diagnosis not present

## 2023-07-26 DIAGNOSIS — D509 Iron deficiency anemia, unspecified: Secondary | ICD-10-CM | POA: Diagnosis not present

## 2023-07-26 MED ORDER — VITAMIN B-12 1000 MCG PO TABS: 1000.0000 ug | ORAL_TABLET | Freq: Every day | ORAL | 2 refills | Status: DC

## 2023-07-26 NOTE — Progress Notes (Unsigned)
 GI Office Note    Referring Provider: Wyvonna Heidelberg* Primary Care Physician:  Wyvonna Heidelberg, MD  Primary Gastroenterologist: Rheba Cedar, MD   Chief Complaint   No chief complaint on file.   History of Present Illness   [name] is a 68 y.o. female presenting today for follow up. Hematology reached out yesterday requesting patinet be seen for persistent IDA. She was last seen in the officein 11/2022.   Patient is chronically anticoagulated for Afib, with h/o chronic diastolic heart failure, CAD, HTN, left lower lung carcinoid tumor (not a candidate for treatment), IDA followed by hematology with history of iron  infusions, CKD.     Patient has a DSS legal guardian, [name], who can be reached at [phone].    She has history of GAVE s/p APC in 11/2021 when she presented with Hgb of 4.8.      Previous work-up:   EGD 2012 for IDA showed small hiatal hernia, couple of antral erosions, otherwise normal exam.  Biopsies negative for celiac disease or H. pylori.  Capsule study not completed at this time because of multiple Hemoccult negative stools.   Colonoscopy April 2022 with seven 5 to 9mm polyps in the rectum and ascending colon resected.  Pathology revealed multiple tubular adenomas.  She had nonbleeding internal hemorrhoids.  Recommended 3-year surveillance exam.   She presented to GI again for IDA back in May 2023.  EGD updated in July 2023.  She had angry appearing gastric mucosa diffusely with erythema patchy erosions nodularity in the antrum.  Marked friability of the mucosa with touch bleeding.  IDA felt to be due to gastric blood loss in the setting of Eliquis .  Gastric biopsy showing chemical/reactive gastropathy.  No H. pylori.  Capsule study not felt to be indicated.   EGD 11/2021: -gastric antral vascular ectasia with bleeding s/p APC  Medications   Current Outpatient Medications  Medication Sig Dispense Refill   acetaminophen   (TYLENOL ) 325 MG tablet Take 325 mg by mouth every 12 (twelve) hours as needed for moderate pain (pain score 4-6).     albuterol  (PROVENTIL  HFA;VENTOLIN  HFA) 108 (90 Base) MCG/ACT inhaler Inhale 2 puffs into the lungs every 4 (four) hours as needed for shortness of breath.     apixaban  (ELIQUIS ) 5 MG TABS tablet Take 1 tablet (5 mg total) by mouth 2 (two) times daily. 60 tablet 3   calcitRIOL  (ROCALTROL ) 0.25 MCG capsule Take 0.25 mcg by mouth every Monday, Wednesday, and Friday.     cetirizine  (ZYRTEC ) 10 MG tablet Take 10 mg by mouth daily.     Cholecalciferol  (VITAMIN D3) 50 MCG (2000 UT) TABS TAKE 1 TABLET BY MOUTH ONCE DAILY. 30 tablet 2   cyanocobalamin  (VITAMIN B12) 1000 MCG tablet Take 1 tablet (1,000 mcg total) by mouth daily. 90 tablet 2   diltiazem  (CARDIZEM  CD) 240 MG 24 hr capsule Take 1 capsule (240 mg total) by mouth daily. 30 capsule 0   DROPSAFE SAFETY PEN NEEDLES 31G X 6 MM MISC      ferrous sulfate  325 (65 FE) MG tablet Take 1 tablet (325 mg total) by mouth every other day. 90 tablet 3   Finerenone  (KERENDIA ) 10 MG TABS Take 10 mg by mouth daily.     fluticasone  (FLONASE ) 50 MCG/ACT nasal spray Place 2 sprays into both nostrils daily as needed for allergies.     folic acid  (FOLVITE ) 1 MG tablet Take 1 tablet (1 mg total) by mouth daily.  60 tablet 2   furosemide  (LASIX ) 40 MG tablet Take 20 mg by mouth See admin instructions. Take 20 mg twice daily, alternating with 20 mg daily every other day     GERI-TUSSIN DM 10-100 MG/5ML liquid Take by mouth.     glucose blood (EASYMAX TEST) test strip CHECK BLOOD SUGAR 4 TIMES A DAY. (BREAKFAST, LUNCH, SUPPER & BEDTIME) 200 strip 2   hydrOXYzine (ATARAX) 10 MG tablet Take 10 mg by mouth 3 (three) times daily.     insulin  regular human CONCENTRATED (HUMULIN  R U-500 KWIKPEN) 500 UNIT/ML KwikPen Inject 30-40 Units into the skin 3 (three) times daily with meals. Inject 40 units with breakfast, 30 units with lunch, and 40 units with supper only if  pre-meal glucose is above 90 and only if she eats. 6 mL 2   ipratropium-albuterol  (DUONEB) 0.5-2.5 (3) MG/3ML SOLN Take 3 mLs by nebulization every 6 (six) hours as needed (for cough).     isosorbide  dinitrate (ISORDIL ) 10 MG tablet Take 1 tablet (10 mg total) by mouth 3 (three) times daily. 90 tablet 0   Lancets 28G MISC CHECK BLOOD SUGAR 4 TIMES A DAY. (BREAKFAST, LUNCH, SUPPER, & BEDTIME) 100 each 1   loperamide (IMODIUM) 2 MG capsule Take 2-4 mg by mouth See admin instructions. Take 4 mg by mouth now: then 2 mg after each loose stool as needed, do not exceed 5 doses in 24 hours.     losartan  (COZAAR ) 25 MG tablet Take 12.5 mg by mouth daily.     metolazone (ZAROXOLYN) 5 MG tablet Take 5 mg by mouth in the morning.     metoprolol  tartrate (LOPRESSOR ) 50 MG tablet Take 50 mg by mouth 2 (two) times daily.     montelukast  (SINGULAIR ) 10 MG tablet Take 10 mg by mouth at bedtime.     pantoprazole  (PROTONIX ) 40 MG tablet Take one bid 60 tablet 0   pantoprazole  (PROTONIX ) 40 MG tablet Take one daily 30 minutes before breakfast 30 tablet 5   polyethylene glycol powder (GLYCOLAX /MIRALAX ) 17 GM/SCOOP powder Take 17 g by mouth daily as needed for mild constipation or moderate constipation.     potassium chloride  SA (KLOR-CON  M) 20 MEQ tablet Take 20 mEq by mouth daily.     rosuvastatin  (CRESTOR ) 20 MG tablet TAKE (1) TABLET BY MOUTH ONCE DAILY. (Patient taking differently: Take 10 mg by mouth daily.) 30 tablet 3   TRELEGY ELLIPTA 100-62.5-25 MCG/ACT AEPB Inhale 1 puff into the lungs daily.     triamcinolone cream (KENALOG) 0.5 % Apply 1 application  topically every 12 (twelve) hours as needed (applied to bilateral legs for allergic dermatitis).     No current facility-administered medications for this visit.    Allergies   Allergies as of 07/27/2023   (No Known Allergies)     Past Medical History   Past Medical History:  Diagnosis Date   Arthritis    Asthma    Atrial fibrillation (HCC)     Carcinoid tumor determined by biopsy of lung 05/2018   Left lung   Chronic diastolic CHF (congestive heart failure) (HCC)    CKD (chronic kidney disease), stage III (HCC)    Diabetes mellitus type 2 in obese    Essential hypertension    GERD (gastroesophageal reflux disease)    Heart murmur    Iron  deficiency anemia 10/16/2010   Mental handicap 10/16/2010   Mild CAD 2013   Morbid obesity (HCC)    OSA (obstructive sleep apnea)  Past Surgical History   Past Surgical History:  Procedure Laterality Date   ABDOMINAL HYSTERECTOMY     AV FISTULA PLACEMENT Right 06/06/2018   Procedure: ARTERIOVENOUS (AV) FISTULA CREATION;  Surgeon: Young Hensen, MD;  Location: [facility] OR;  Service: Vascular;  Laterality: Right;   BIOPSY  10/16/2021   Procedure: BIOPSY;  Surgeon: Suzette Espy, MD;  Location: [facility];  Service: Endoscopy;;   BRONCHIAL DILITATION  12/13/2020   Procedure: BRONCHIAL DILITATION;  Surgeon: Prudy Brownie, DO;  Location: [facility] ENDOSCOPY;  Service: Pulmonary;;   CESAREAN SECTION     CHOLECYSTECTOMY     COLONOSCOPY  08/2010   normal TI, sigmoid polyp (adenoma ). Next TCS due  08/2015,   COLONOSCOPY WITH PROPOFOL  N/A 07/08/2020   Procedure: COLONOSCOPY WITH PROPOFOL ;  Surgeon: Suzette Espy, MD;  Location: [facility];  Service: Endoscopy;  Laterality: N/A;  AM (diabetic and facility patient)   ESOPHAGOGASTRODUODENOSCOPY  08/2010   antral and duodenal erosions s/p bx (chronic gastritis, no h.pylori, no celiac dz ), hiatal hernia   ESOPHAGOGASTRODUODENOSCOPY (EGD) WITH PROPOFOL  N/A 10/16/2021   Procedure: ESOPHAGOGASTRODUODENOSCOPY (EGD) WITH PROPOFOL ;  Surgeon: Suzette Espy, MD;  Location: [facility];  Service: Endoscopy;  Laterality: N/A;  11:45am   ESOPHAGOGASTRODUODENOSCOPY (EGD) WITH PROPOFOL  N/A 12/18/2021   Procedure: ESOPHAGOGASTRODUODENOSCOPY (EGD) WITH PROPOFOL ;  Surgeon: Vinetta Greening, DO;  Location: [facility];  Service: Endoscopy;  Laterality: N/A;    ESOPHAGOGASTRODUODENOSCOPY (EGD) WITH PROPOFOL  N/A 01/19/2023   Procedure: ESOPHAGOGASTRODUODENOSCOPY (EGD) WITH PROPOFOL ;  Surgeon: Urban Garden, MD;  Location: [facility];  Service: Gastroenterology;  Laterality: N/A;  2:30 pm, asa 3   FISTULA SUPERFICIALIZATION Right 08/25/2018   Procedure: FISTULA SUPERFICIALIZATION RIGHT ARM;  Surgeon: Adine Hoof, MD;  Location: [facility] OR;  Service: Cardiovascular;  Laterality: Right;   HOT HEMOSTASIS  12/18/2021   Procedure: HOT HEMOSTASIS (ARGON PLASMA COAGULATION/BICAP);  Surgeon: Vinetta Greening, DO;  [facility]: [facility];  Service: Endoscopy;;   HOT HEMOSTASIS  01/19/2023   Procedure: HOT HEMOSTASIS (ARGON PLASMA COAGULATION/BICAP);  Surgeon: Umberto Ganong, Bearl Limes, MD;  Location: [facility];  Service: Gastroenterology;;   IR FLUORO GUIDE CV LINE RIGHT  05/31/2018   IR US  GUIDE VASC ACCESS RIGHT  05/31/2018   KNEE SURGERY     right knee @ 68 years of age   LEFT AND RIGHT HEART CATHETERIZATION WITH CORONARY ANGIOGRAM N/A 09/21/2011   Procedure: LEFT AND RIGHT HEART CATHETERIZATION WITH CORONARY ANGIOGRAM;  Surgeon: Darrold Emms, MD;  Location: [facility] CATH LAB;  Service: Cardiovascular;  Laterality: N/A;   POLYPECTOMY  07/08/2020   Procedure: POLYPECTOMY INTESTINAL;  Surgeon: Suzette Espy, MD;  Location: [facility];  Service: Endoscopy;;   VIDEO BRONCHOSCOPY Left 12/13/2020   Procedure: VIDEO BRONCHOSCOPY WITHOUT FLUORO;  Surgeon: Prudy Brownie, DO;  Location: [facility];  Service: Pulmonary;  Laterality: Left;  Cryotherapy    Past Family History   Family History  Problem Relation Age of Onset   Diabetes Mother    Hypertension Mother    Heart failure Mother    Hypertension Father    Atrial fibrillation Father    Hypertension Son    Kidney disease Other        son reportedly has had cyst on kidney and lung s/p surgery at [facility]   Colon cancer Neg Hx     Past Social History   Social History    Socioeconomic History   Marital  status: Divorced    Spouse name: Not on file   Number of children: 1   Years of education: Not on file   Highest education level: Not on file  Occupational History    Employer: UNEMPLOYED  Tobacco Use   Smoking status: Former    Current packs/day: 0.00    Types: Cigarettes    Start date: 04/11/1996    Quit date: 04/11/1996    Years since quitting: 27.3   Smokeless tobacco: Never   Tobacco comments:    quit a couple year ago  Vaping Use   Vaping status: Never Used  Substance and Sexual Activity   Alcohol  use: No   Drug use: No   Sexual activity: Never  Other Topics Concern   Not on file  Social History Narrative   Lives with parents.    Social Drivers of Corporate investment banker Strain: Not on file  Food Insecurity: No Food Insecurity (12/17/2021)   Hunger Vital Sign    Worried About Running Out of Food in the Last Year: Never true    Ran Out of Food in the Last Year: Never true  Transportation Needs: No Transportation Needs (12/17/2021)   PRAPARE - Administrator, Civil Service (Medical): No    Lack of Transportation (Non-Medical): No  Physical Activity: Not on file  Stress: Not on file  Social Connections: Not on file  Intimate Partner Violence: Not At Risk (12/17/2021)   Humiliation, Afraid, Rape, and Kick questionnaire    Fear of Current or Ex-Partner: No    Emotionally Abused: No    Physically Abused: No    Sexually Abused: No    Review of Systems   General: Negative for anorexia, weight loss, fever, chills, fatigue, weakness. ENT: Negative for hoarseness, difficulty swallowing , nasal congestion. CV: Negative for chest pain, angina, palpitations, dyspnea on exertion, peripheral edema.  Respiratory: Negative for dyspnea at rest, dyspnea on exertion, cough, sputum, wheezing.  GI: See history of present illness. GU:  Negative for dysuria, hematuria, urinary incontinence, urinary frequency, nocturnal urination.   Endo: Negative for unusual weight change.     Physical Exam   There were no vitals taken for this visit.   General: Well-nourished, well-developed in no acute distress.  Eyes: No icterus. Mouth: Oropharyngeal mucosa moist and pink , no lesions erythema or exudate. Lungs: Clear to auscultation bilaterally.  Heart: Regular rate and rhythm, no murmurs rubs or gallops.  Abdomen: Bowel sounds are normal, nontender, nondistended, no hepatosplenomegaly or masses,  no abdominal bruits or hernia , no rebound or guarding.  Rectal: ***  Extremities: No lower extremity edema. No clubbing or deformities. Neuro: Alert and oriented x 4   Skin: Warm and dry, no jaundice.   Psych: Alert and cooperative, normal mood and affect.  Labs   Lab Results  Component Value Date   NA 137 07/19/2023   CL 98 07/19/2023   K 3.6 07/19/2023   CO2 28 07/19/2023   BUN 44 (H) 07/19/2023   CREATININE 1.74 (H) 07/19/2023   GFRNONAA 32 (L) 07/19/2023   CALCIUM  9.2 07/19/2023   PHOS 5.9 (H) 06/09/2018   ALBUMIN 3.4 (L) 07/19/2023   GLUCOSE 71 07/19/2023   Lab Results  Component Value Date   ALT 11 07/19/2023   AST 14 (L) 07/19/2023   ALKPHOS 74 07/19/2023   BILITOT 0.6 07/19/2023   Lab Results  Component Value Date   WBC 7.9 07/19/2023   HGB 9.1 (L) 07/19/2023  HCT 29.6 (L) 07/19/2023   MCV 95.5 07/19/2023   PLT 327 07/19/2023   Lab Results  Component Value Date   IRON  51 07/19/2023   TIBC 320 07/19/2023   FERRITIN 85 07/19/2023   Lab Results  Component Value Date   VITAMINB12 440 07/19/2023   Lab Results  Component Value Date   FOLATE 36.7 07/19/2023    Imaging Studies   No results found.  Assessment       PLAN   ***   Trudie Fuse. Harles Lied, MHS, PA-C [facility]

## 2023-07-26 NOTE — Patient Instructions (Signed)
 VISIT SUMMARY:  Today, we discussed your persistent iron  deficiency anemia and its potential causes, including gastrointestinal bleeding and chronic kidney disease. We reviewed your recent lab results and current treatments, and we have outlined a plan to address your ongoing health concerns.  YOUR PLAN:  -IRON  DEFICIENCY ANEMIA: Iron  deficiency anemia means you have low levels of iron  in your blood, which can cause fatigue and other symptoms. Despite previous treatments, your iron  levels remain low. We will continue your oral iron  supplements every other day and recheck your iron  levels with lab tests in one month. If your iron  levels are still low, we may consider iron  injections.  -GASTROINTESTINAL BLEEDING: Gastrointestinal bleeding refers to bleeding in your digestive tract, which can contribute to low iron  levels. Your previous endoscopy showed some bleeding that was treated, but there may be ongoing bleeding. We will send a message to Dr. Riley Cheadle to schedule a follow-up appointment and consider a capsule endoscopy to check for any further bleeding.  -CHRONIC KIDNEY DISEASE: Chronic kidney disease affects your kidneys' ability to function properly, which can impact your iron  levels. It is important to maintain higher iron  levels to manage this condition.  INSTRUCTIONS:  Please continue taking your oral iron  supplements every other day. We will schedule laboratory tests in one month to reassess your iron  levels. Additionally, we will contact Dr. Riley Cheadle to arrange a follow-up appointment and discuss the possibility of a capsule endoscopy to check for ongoing gastrointestinal bleeding.

## 2023-07-27 ENCOUNTER — Encounter: Payer: Self-pay | Admitting: Nurse Practitioner

## 2023-07-27 ENCOUNTER — Ambulatory Visit (INDEPENDENT_AMBULATORY_CARE_PROVIDER_SITE_OTHER): Admitting: Gastroenterology

## 2023-07-27 ENCOUNTER — Telehealth: Payer: Self-pay | Admitting: Gastroenterology

## 2023-07-27 ENCOUNTER — Ambulatory Visit: Payer: Medicare Other | Attending: Nurse Practitioner | Admitting: Nurse Practitioner

## 2023-07-27 ENCOUNTER — Encounter: Payer: Self-pay | Admitting: Gastroenterology

## 2023-07-27 VITALS — BP 138/70 | HR 63 | Temp 97.6°F | Ht 62.0 in | Wt 246.2 lb

## 2023-07-27 VITALS — BP 134/82 | HR 61 | Ht 62.0 in | Wt 245.2 lb

## 2023-07-27 DIAGNOSIS — E782 Mixed hyperlipidemia: Secondary | ICD-10-CM | POA: Diagnosis not present

## 2023-07-27 DIAGNOSIS — D500 Iron deficiency anemia secondary to blood loss (chronic): Secondary | ICD-10-CM

## 2023-07-27 DIAGNOSIS — D509 Iron deficiency anemia, unspecified: Secondary | ICD-10-CM | POA: Diagnosis not present

## 2023-07-27 DIAGNOSIS — R6881 Early satiety: Secondary | ICD-10-CM

## 2023-07-27 DIAGNOSIS — I5032 Chronic diastolic (congestive) heart failure: Secondary | ICD-10-CM | POA: Insufficient documentation

## 2023-07-27 DIAGNOSIS — I10 Essential (primary) hypertension: Secondary | ICD-10-CM | POA: Insufficient documentation

## 2023-07-27 DIAGNOSIS — K219 Gastro-esophageal reflux disease without esophagitis: Secondary | ICD-10-CM | POA: Diagnosis not present

## 2023-07-27 DIAGNOSIS — K31819 Angiodysplasia of stomach and duodenum without bleeding: Secondary | ICD-10-CM | POA: Insufficient documentation

## 2023-07-27 DIAGNOSIS — I4891 Unspecified atrial fibrillation: Secondary | ICD-10-CM | POA: Insufficient documentation

## 2023-07-27 DIAGNOSIS — Z860101 Personal history of adenomatous and serrated colon polyps: Secondary | ICD-10-CM | POA: Diagnosis not present

## 2023-07-27 MED ORDER — PANTOPRAZOLE SODIUM 40 MG PO TBEC: 40.0000 mg | DELAYED_RELEASE_TABLET | Freq: Two times a day (BID) | ORAL | 5 refills | Status: DC

## 2023-07-27 NOTE — Progress Notes (Signed)
 Cardiology Office Note:  .   Date: 07/27/2023 ID:  [name], DOB 06-10-1955, MRN [id number] PCP: Wyvonna Heidelberg, MD  [facility] Providers Cardiologist:  Teddie Favre, MD    History of Present Illness: [name]   [name] is a 68 y.o. 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  Resident at [facility].  Last seen by Dr. Londa Rival on September 08, 2022.  Activity was limited by arthritis along her knees.  She was overall doing very well.  04/23/2023 - Today she presents for 100-month follow-up appointment with her guardian.  Difficult historian d/t mental handicap. She admits to palpitations/what sounds like episodes of A-fib every once in a while that are "painful", says sometimes it lasts a few hours and is resolved the next day. Denies any shortness of breath, syncope, presyncope, dizziness, orthopnea, PND, swelling or significant weight changes, acute bleeding, or claudication.   07/27/2023 -presents today with her caregiver.  She is doing well.  Difficult historian due to mental handicap.  Denies any episodes of A-fib or any tachycardia or palpitations.  Caregiver admits to patient experiencing nocturia and wants to know if Lasix  dosage should be adjusted.  Admits to chronic, intermittent DOE related to her COPD.  Remains stable over time. Denies any chest pain, worsening shortness of breath, palpitations, syncope, presyncope, dizziness, orthopnea, PND, swelling or significant weight changes, acute bleeding, or claudication.  ROS: Negative.  See HPI.  Studies Reviewed: [name]    EKG: EKG is not ordered today.     TEE 04/2018:  IMPRESSIONS    1. Calcified, heterogeneous, primarily immobile lesion on the left  coronary cusp, appears  to be primarily on the ventricular aspect of the  left coronary cusp. In clip 33 where it is well seen it measures 0.98 cm x 0.56 cm. The lesion has the appearance of a subacute or healed vegetation. Differential also includes calcified nodule in the setting of chronic dialysis.   2. The left ventricle has normal systolic function, with an ejection  fraction of 60-65%. The cavity size was normal.   3. The right ventricle has normal systolc function. The cavity was  normal. There is no increase in right ventricular wall thickness.   4. The aortic valve is tricuspid. Aortic valve regurgitation is trivial  by color flow Doppler.   5. The tricuspid valve was normal in structure.   6. The pulmonic valve was normal in structure.   7. There is evidence of severe atherosclerotic plaque in the aortic arch and descending aorta.   8. Tiny patent foramen ovale with left to right shunting across the  atrial septum by color flow Doppler.   9. The interatrial septum appears to be lipomatous.  10. No left atrial appendage thrombus.  Echo 04/2018: 1. The left ventricle has hyperdynamic systolic function, with an  ejection fraction of >65%. The cavity size was normal. Left ventricular  diastolic Doppler parameters are consistent with impaired relaxation.   2. The right ventricle has normal systolic function. The cavity was  normal. There is no increase in right ventricular wall thickness.   3. The mitral valve is normal in structure.   4. The tricuspid valve is normal in structure.   5. The aortic valve is tricuspid Mild thickening of the aortic valve  Mild  calcification of the aortic valve.   6. There is a small mobile density (1.1 x 0.5cm) on the ventricular  surface of the aortic valve. Challenging to visualize. Consider TEE if  clinically warranted.   7. The pulmonic valve was normal in structure.  Physical Exam:   VS:  BP 134/82   Pulse 61   Ht 5\' 2"  (1.575 m)   Wt 245 lb 3.2 oz (111.2 kg)    SpO2 99%   BMI 44.85 kg/m    Wt Readings from Last 3 Encounters:  07/27/23 246 lb 3.2 oz (111.7 kg)  07/27/23 245 lb 3.2 oz (111.2 kg)  07/26/23 242 lb (109.8 kg)    GEN: Morbidly obese, 68 y.o. female in no acute distress NECK: No JVD; No carotid bruits CARDIAC: S1/S2, irregularly irregular rhythm, no murmurs, rubs, gallops RESPIRATORY:  Clear and diminished to auscultation without rales, wheezing or rhonchi  ABDOMEN: Soft, non-tender, non-distended EXTREMITIES:  No edema; No deformity   ASSESSMENT AND PLAN: .    A-fib Denies any tachycardia or palpitations.  Heart rate is well-controlled.  Continue diltiazem , Lopressor . Continue Eliquis  for stroke prevention.  She denies any bleeding issues and is on appropriate dosage. Heart healthy diet and regular cardiovascular exercise encouraged.  Not felt to be a good surgical candidate due to mental handicap.   HFpEF Stage C, NYHA class I symptoms. EF 60-65%. Euvolemic and well compensated on exam.  Instructed patient and caregiver to take Lasix  40 mg every other day alternating with 20 mg every other day and to take medication in morning to avoid nocturia.  No other medication changes at this time. Low sodium diet, fluid restriction <2L, and daily weights encouraged. Educated to contact our office for weight gain of 2 lbs overnight or 5 lbs in one week.  HTN BP stable. Discussed to monitor BP at home at least 2 hours after medications and sitting for 5-10 minutes.  No medication changes at this time besides what is noted above. Heart healthy diet and regular cardiovascular exercise encouraged.   Mixed HLD LDL 42 last year. LDL at goal. No medication changes at this time. Heart healthy diet and regular cardiovascular exercise encouraged.   Morbid obesity Weight loss via diet and exercise encouraged. Discussed the impact being overweight would have on cardiovascular risk.   Dispo: Follow-up with Dr. Londa Rival or APP in 6 months or sooner if  anything changes.   Signed, Lasalle Pointer, NP

## 2023-07-27 NOTE — Patient Instructions (Signed)
 Medication Instructions:  Your physician has recommended you make the following change in your medication:  Updated Furosemide  Take 40 mg every other day, alternating with 20 mg daily every other day Continue taking all other medications as prescribed  Labwork: None  Testing/Procedures: None  Follow-Up: Your physician recommends that you schedule a follow-up appointment in: 6 months  Any Other Special Instructions Will Be Listed Below (If Applicable). Thank you for choosing [facility]!     If you need a refill on your cardiac medications before your next appointment, please call your pharmacy.

## 2023-07-27 NOTE — Telephone Encounter (Signed)
 Patient seen in the office today.   -Please schedule colonoscopy and upper endoscopy with Dr. Riley Cheadle. ASA 3. Reach out to her social worker, Chance, to arrange. He will reach out to patient's legal guardian for us .   -She will need cardiac clearance to HOLD eliquis  48 hours before procedures.  -Hold iron  7 days. -Day of prep: Humulin  R, if pre-meal glucose above 90 and she eats, then use  inject 20 units with BF, 15 units with lunch, 20 units with supper -given bisacodyl 10mg  daily for 3 days before prep -use trilyte  due to kidney function

## 2023-07-27 NOTE — Telephone Encounter (Signed)
   What type of surgery is being performed? colonoscopy and upper endoscopy   When is surgery scheduled? TBD  What type of clearance is required (medical or pharmacy to hold medication or both? Medication  Are there any medications that need to be held prior to surgery and how long? ELIQUIS  x 48 hours prior  Name of physician performing surgery?  Dr. Loa Riling [facility] at [facility] Phone: [phone] Fax: [phone]  Anethesia type (none, local, MAC, general)? MAC

## 2023-07-27 NOTE — Patient Instructions (Signed)
 We will be in touch to schedule colonoscopy and upper endoscopy once we get clearance to hold Eliquis  for 48 hours before.  Increase pantoprazole  to 40mg  twice daily before breakfast and evening meal.

## 2023-07-28 ENCOUNTER — Ambulatory Visit

## 2023-07-29 DIAGNOSIS — E1122 Type 2 diabetes mellitus with diabetic chronic kidney disease: Secondary | ICD-10-CM | POA: Diagnosis not present

## 2023-07-29 DIAGNOSIS — E8770 Fluid overload, unspecified: Secondary | ICD-10-CM | POA: Diagnosis not present

## 2023-07-29 DIAGNOSIS — D638 Anemia in other chronic diseases classified elsewhere: Secondary | ICD-10-CM | POA: Diagnosis not present

## 2023-07-29 DIAGNOSIS — N1832 Chronic kidney disease, stage 3b: Secondary | ICD-10-CM | POA: Diagnosis not present

## 2023-07-30 ENCOUNTER — Other Ambulatory Visit: Payer: Self-pay | Admitting: Cardiology

## 2023-07-30 ENCOUNTER — Inpatient Hospital Stay: Attending: Oncology

## 2023-07-30 ENCOUNTER — Encounter: Payer: Self-pay | Admitting: Oncology

## 2023-07-30 VITALS — BP 96/51 | HR 61 | Temp 97.4°F | Resp 18

## 2023-07-30 DIAGNOSIS — D509 Iron deficiency anemia, unspecified: Secondary | ICD-10-CM | POA: Insufficient documentation

## 2023-07-30 DIAGNOSIS — D500 Iron deficiency anemia secondary to blood loss (chronic): Secondary | ICD-10-CM

## 2023-07-30 MED ORDER — ACETAMINOPHEN 325 MG PO TABS
650.0000 mg | ORAL_TABLET | Freq: Once | ORAL | Status: AC
Start: 2023-07-30 — End: 2023-07-30
  Administered 2023-07-30: 650 mg via ORAL
  Filled 2023-07-30: qty 2

## 2023-07-30 MED ORDER — SODIUM CHLORIDE 0.9 % IV SOLN
Freq: Once | INTRAVENOUS | Status: AC
Start: 2023-07-30 — End: 2023-07-30

## 2023-07-30 MED ORDER — CETIRIZINE HCL 10 MG/ML IV SOLN
10.0000 mg | Freq: Once | INTRAVENOUS | Status: AC
  Administered 2023-07-30: 10 mg via INTRAVENOUS
  Filled 2023-07-30: qty 1

## 2023-07-30 MED ORDER — SODIUM CHLORIDE 0.9 % IV SOLN
1000.0000 mg | Freq: Once | INTRAVENOUS | Status: AC
  Administered 2023-07-30: 1000 mg via INTRAVENOUS
  Filled 2023-07-30: qty 1000

## 2023-07-30 NOTE — Assessment & Plan Note (Deleted)
 Patient has chronic anemia likely secondary to iron deficiency and chronic kidney disease.  Iron levels improved with IV iron with slight improvement in hemoglobin.  Patient reports improvement in fatigue. -Will order labs today to rule out multiple myeloma and obtain a baseline EPO level -If patient has no significant improvement in hemoglobin and if it stays below 10, will start on ESA shots.  Return to clinic in 1 month with labs to discuss further management.

## 2023-07-30 NOTE — Patient Instructions (Signed)

## 2023-07-30 NOTE — Progress Notes (Signed)
 Patient tolerated iron infusion with no complaints voiced.  Peripheral IV site clean and dry with good blood return noted before and after infusion.  Band aid applied.  VSS with discharge and left in satisfactory condition with no s/s of distress noted.

## 2023-07-30 NOTE — Assessment & Plan Note (Signed)
 Patient has chronic anemia likely secondary to iron  deficiency and chronic kidney disease.   Iron  levels improved with IV iron  with slight improvement in hemoglobin.   Patient reports improvement in fatigue. Multiple myeloma workup: Negative  - TSAT still low for goal of 35 for chronic kidney disease.  Will administer 1 more dose of Monoferric  - Will reach out to Dr. Sammi Crick to see if patient can be scheduled for workup for GI bleeding - Continue oral iron  supplements every other day - Once the iron  deficiency is fixed, we will consider erythropoietin  shots to bring up the hemoglobin above 10.  Return to clinic in 4 weeks with labs

## 2023-07-30 NOTE — Assessment & Plan Note (Signed)
 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  She is asymptomatic currently.  -Will repeat CT scan in 1 year that is 12/2023 -Continue to monitor

## 2023-07-30 NOTE — Progress Notes (Signed)
 [facility] at [facility]  HEMATOLOGY FOLLOW-UP VISIT  [name], Anne Awe, MD  REASON FOR FOLLOW-UP: Iron  deficiency anemia  ASSESSMENT & PLAN:  Patient is a 68 year old female following for iron  deficiency anemia.    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  She is asymptomatic currently.  -Will repeat CT scan in 1 year that is 12/2023 -Continue to monitor  Anemia Patient has chronic anemia likely secondary to iron  deficiency and chronic kidney disease.   Iron  levels improved with IV iron  with slight improvement in hemoglobin.   Patient reports improvement in fatigue. Multiple myeloma workup: Negative  - TSAT still low for goal of 35 for chronic kidney disease.  Will administer 1 more dose of Monoferric  - Will reach out to Dr. Sammi Crick to see if patient can be scheduled for workup for GI bleeding - Continue oral iron  supplements every other day - Once the iron  deficiency is fixed, we will consider erythropoietin  shots to bring up the hemoglobin above 10.  Return to clinic in 4 weeks with labs    Orders Placed This Encounter  Procedures   Ferritin    Standing Status:   Future    Expected Date:   08/26/2023    Expiration Date:   07/29/2024   Folate    Standing Status:   Future    Expected Date:   08/26/2023    Expiration Date:   07/29/2024   Vitamin B12    Standing Status:   Future    Expected Date:   08/26/2023    Expiration Date:   07/29/2024   CBC with Differential/Platelet    Standing Status:   Future    Expected Date:   08/26/2023    Expiration Date:   07/29/2024   Comprehensive metabolic panel with GFR    Standing Status:   Future    Expected Date:   08/26/2023    Expiration Date:   07/29/2024   Iron  and TIBC    Standing Status:   Future    Expected Date:   08/26/2023    Expiration Date:   07/29/2024    The total time spent in the appointment was 30 minutes encounter with patients  including review of chart and various tests results, discussions about plan of care and coordination of care plan   All questions were answered. The patient knows to call the clinic with any problems, questions or concerns. No barriers to learning was detected.  Anne Grade, MD 5/2/20253:02 PM   SUMMARY OF HEMATOLOGIC HISTORY: -Iron  deficiency anemia likely secondary to GI bleeding -S/p IV Monoferric  1000 mg on 02/23/2023 and 05/06/2023. - SPEP: No M spike, IFE: Normal, slight elevated free light chains with normal FLC ratio.   INTERVAL HISTORY: [name] 68 y.o. female following for iron  deficiency anemia.  Patient is accompanied by her guardian today.  She reports ongoing fatigue, but has some improvement since starting iron  supplementation.  She sees Dr. Carrolyn Clan for chronic kidney disease.  She has no other complaints today.  She denies melena, hematochezia, weight loss, loss of appetite, fevers, chills.  Overall feeling well.   I have reviewed the past medical history, past surgical history, social history and family history with the patient   ALLERGIES:  has no known allergies.  MEDICATIONS:  Current Outpatient Medications  Medication Sig Dispense Refill   acetaminophen  (TYLENOL ) 325 MG tablet Take 325 mg by mouth every 12 (twelve) hours as needed  for moderate pain (pain score 4-6).     albuterol  (PROVENTIL  HFA;VENTOLIN  HFA) 108 (90 Base) MCG/ACT inhaler Inhale 2 puffs into the lungs every 4 (four) hours as needed for shortness of breath.     apixaban  (ELIQUIS ) 5 MG TABS tablet Take 1 tablet (5 mg total) by mouth 2 (two) times daily. 60 tablet 3   calcitRIOL  (ROCALTROL ) 0.25 MCG capsule Take 0.25 mcg by mouth every Monday, Wednesday, and Friday.     cetirizine  (ZYRTEC ) 10 MG tablet Take 10 mg by mouth daily.     Cholecalciferol  (VITAMIN D3) 50 MCG (2000 UT) TABS TAKE 1 TABLET BY MOUTH ONCE DAILY. 30 tablet 2   cyanocobalamin  (VITAMIN B12) 1000 MCG tablet Take 1 tablet  (1,000 mcg total) by mouth daily. 90 tablet 2   diltiazem  (CARDIZEM  CD) 240 MG 24 hr capsule Take 1 capsule (240 mg total) by mouth daily. 30 capsule 0   DROPSAFE SAFETY PEN NEEDLES 31G X 6 MM MISC      ferrous sulfate  325 (65 FE) MG tablet Take 1 tablet (325 mg total) by mouth every other day. 90 tablet 3   Finerenone  (KERENDIA ) 10 MG TABS Take 10 mg by mouth daily.     fluticasone  (FLONASE ) 50 MCG/ACT nasal spray Place 2 sprays into both nostrils daily as needed for allergies.     folic acid  (FOLVITE ) 1 MG tablet Take 1 tablet (1 mg total) by mouth daily. 60 tablet 2   furosemide  (LASIX ) 40 MG tablet Take 40 mg by mouth See admin instructions. Take 40 mg every other day, alternating with 20 mg daily every other day     GERI-TUSSIN DM 10-100 MG/5ML liquid Take by mouth.     glucose blood (EASYMAX TEST) test strip CHECK BLOOD SUGAR 4 TIMES A DAY. (BREAKFAST, LUNCH, SUPPER & BEDTIME) 200 strip 2   hydrOXYzine (ATARAX) 10 MG tablet Take 10 mg by mouth 3 (three) times daily.     insulin  regular human CONCENTRATED (HUMULIN  R U-500 KWIKPEN) 500 UNIT/ML KwikPen Inject 30-40 Units into the skin 3 (three) times daily with meals. Inject 40 units with breakfast, 30 units with lunch, and 40 units with supper only if pre-meal glucose is above 90 and only if she eats. 6 mL 2   ipratropium-albuterol  (DUONEB) 0.5-2.5 (3) MG/3ML SOLN Take 3 mLs by nebulization every 6 (six) hours as needed (for cough).     isosorbide  dinitrate (ISORDIL ) 10 MG tablet Take 1 tablet (10 mg total) by mouth 3 (three) times daily. 90 tablet 0   Lancets 28G MISC CHECK BLOOD SUGAR 4 TIMES A DAY. (BREAKFAST, LUNCH, SUPPER, & BEDTIME) 100 each 1   loperamide (IMODIUM) 2 MG capsule Take 2-4 mg by mouth See admin instructions. Take 4 mg by mouth now: then 2 mg after each loose stool as needed, do not exceed 5 doses in 24 hours.     losartan  (COZAAR ) 25 MG tablet Take 12.5 mg by mouth daily.     metolazone (ZAROXOLYN) 5 MG tablet Take 5 mg by  mouth in the morning.     metoprolol  tartrate (LOPRESSOR ) 50 MG tablet Take 50 mg by mouth 2 (two) times daily.     montelukast  (SINGULAIR ) 10 MG tablet Take 10 mg by mouth at bedtime.     polyethylene glycol powder (GLYCOLAX /MIRALAX ) 17 GM/SCOOP powder Take 17 g by mouth daily as needed for mild constipation or moderate constipation.     potassium chloride  SA (KLOR-CON  M) 20 MEQ tablet Take 20 mEq  by mouth daily.     rosuvastatin  (CRESTOR ) 20 MG tablet TAKE (1) TABLET BY MOUTH ONCE DAILY. (Patient taking differently: Take 10 mg by mouth daily.) 30 tablet 3   TRELEGY ELLIPTA 100-62.5-25 MCG/ACT AEPB Inhale 1 puff into the lungs daily.     triamcinolone cream (KENALOG) 0.5 % Apply 1 application  topically every 12 (twelve) hours as needed (applied to bilateral legs for allergic dermatitis).     pantoprazole  (PROTONIX ) 40 MG tablet Take 1 tablet (40 mg total) by mouth 2 (two) times daily before a meal. 60 tablet 5   No current facility-administered medications for this visit.     REVIEW OF SYSTEMS:   Constitutional: Denies fevers, chills or night sweats Eyes: Denies blurriness of vision Ears, nose, mouth, throat, and face: Denies mucositis or sore throat Respiratory: Denies cough, dyspnea or wheezes Cardiovascular: Denies palpitation, chest discomfort or lower extremity swelling Gastrointestinal:  Denies nausea, heartburn or change in bowel habits Skin: Denies abnormal skin rashes Lymphatics: Denies new lymphadenopathy or easy bruising Neurological:Denies numbness, tingling or new weaknesses Behavioral/Psych: Mood is stable, no new changes  All other systems were reviewed with the patient and are negative.  PHYSICAL EXAMINATION:   Vitals:   07/26/23 1403  BP: 111/65  Pulse: 63  Resp: 19  Temp: 97.7 F (36.5 C)  SpO2: 93%    GENERAL:alert, no distress and comfortable LUNGS: clear to auscultation and percussion with normal breathing effort HEART: regular rate & rhythm and no  murmurs and no lower extremity edema ABDOMEN:abdomen soft, non-tender and normal bowel sounds Musculoskeletal:no cyanosis of digits and no clubbing  NEURO: alert & oriented x 3 with fluent speech  LABORATORY DATA:  I have reviewed the data as listed  Lab Results  Component Value Date   WBC 7.9 07/19/2023   NEUTROABS 5.4 07/19/2023   HGB 9.1 (L) 07/19/2023   HCT 29.6 (L) 07/19/2023   MCV 95.5 07/19/2023   PLT 327 07/19/2023       Chemistry      Component Value Date/Time   NA 137 07/19/2023 1408   NA 136 10/06/2022 0802   K 3.6 07/19/2023 1408   CL 98 07/19/2023 1408   CO2 28 07/19/2023 1408   BUN 44 (H) 07/19/2023 1408   BUN 53 (H) 10/06/2022 0802   CREATININE 1.74 (H) 07/19/2023 1408   CREATININE 2.39 (H) 01/27/2023 1043   CREATININE 1.57 (H) 09/12/2019 0831   GLU 169 05/07/2020 0000      Component Value Date/Time   CALCIUM  9.2 07/19/2023 1408   CALCIUM  7.8 (L) 05/31/2018 0609   ALKPHOS 74 07/19/2023 1408   AST 14 (L) 07/19/2023 1408   AST 9 (L) 01/27/2023 1043   ALT 11 07/19/2023 1408   ALT 5 01/27/2023 1043   BILITOT 0.6 07/19/2023 1408   BILITOT 0.4 01/27/2023 1043      Latest Reference Range & Units 07/19/23 14:07 07/19/23 14:08  Iron  28 - 170 ug/dL  51  UIBC ug/dL  409  TIBC 811 - 914 ug/dL  782  Saturation Ratios 10.4 - 31.8 %  16  Ferritin 11 - 307 ng/mL  85  Folate >5.9 ng/mL 36.7   Vitamin B12 180 - 914 pg/mL  440    Latest Reference Range & Units 06/28/23 13:15  Total Protein ELP 6.0 - 8.5 g/dL 6.4 (C)  Albumin SerPl Elph-Mcnc 2.9 - 4.4 g/dL 3.0 (C)  Albumin/Glob SerPl 0.7 - 1.7  0.9 (C)  Alpha2 Glob SerPl Elph-Mcnc 0.4 -  1.0 g/dL 1.0 (C)  Alpha 1 0.0 - 0.4 g/dL 0.4 (C)  Gamma Glob SerPl Elph-Mcnc 0.4 - 1.8 g/dL 1.2 (C)  M Protein SerPl Elph-Mcnc Not Observed g/dL Not Observed (C)  IFE 1  Comment (C)  Globulin, Total 2.2 - 3.9 g/dL 3.4 (C)  B-Globulin SerPl Elph-Mcnc 0.7 - 1.3 g/dL 0.9 (C)  IgG (Immunoglobin G), Serum 586 - 1,602 mg/dL 1,610   IgM (Immunoglobulin M), Srm 26 - 217 mg/dL 38  IgA 87 - 960 mg/dL 454  (C): Corrected IFE 1 Comment VC       Comment: (NOTE) The immunofixation pattern appears unremarkable. Evidence of monoclonal protein is not apparent.    Latest Reference Range & Units 06/28/23 13:15  Kappa free light chain 3.3 - 19.4 mg/L 80.4 (H)  Lambda free light chains 5.7 - 26.3 mg/L 53.0 (H)  Kappa, lambda light chain ratio 0.26 - 1.65  1.52  (H): Data is abnormally high

## 2023-08-02 NOTE — Telephone Encounter (Signed)
   Primary Cardiologist: Teddie Favre, MD  Chart reviewed as part of pre-operative protocol coverage. Given past medical history and time since last visit, based on ACC/AHA guidelines, [name] would be at acceptable risk for the planned procedure without further cardiovascular testing.   Patient was advised that if she develops new symptoms prior to surgery to contact our office to arrange a follow-up appointment. She verbalized understanding.  Per office protocol, patient can hold Eliquis   for 2 days prior to procedure.  Please resume Eliquis  as soon as possible postprocedure, at the discretion of the surgeon   I will route this recommendation to the requesting party via Epic fax function and remove from pre-op  pool.  Please call with questions.  Gerldine Koch, NP-C  08/02/2023, 12:01 PM [address], [address] [place], [place] [address] Office [phone] Fax [phone]

## 2023-08-02 NOTE — Telephone Encounter (Signed)
 Patient with diagnosis of Afib on Eliquis  for anticoagulation.    Procedure: olonoscopy and upper endoscopy  Date of procedure: TBD   CHA2DS2-VASc Score = 6   This indicates a 9.7% annual risk of stroke. The patient's score is based upon: CHF History: 1 HTN History: 1 Diabetes History: 1 Stroke History: 0 Vascular Disease History: 1 Age Score: 1 Gender Score: 1   CrCl 37 mL/min Platelet count 327 K  Patient has not  had an Afib/aflutter ablation within the last 3 months or DCCV within the last 30 days    Per office protocol, patient can hold Eliquis   for 2 days prior to procedure.  Please resume Eliquis  as soon as possible postprocedure, at the discretion of the surgeon   **This guidance is not considered finalized until pre-operative APP has relayed final recommendations.**

## 2023-08-09 ENCOUNTER — Other Ambulatory Visit: Payer: Self-pay

## 2023-08-09 DIAGNOSIS — E1122 Type 2 diabetes mellitus with diabetic chronic kidney disease: Secondary | ICD-10-CM

## 2023-08-09 MED ORDER — LANCETS 30G MISC: 2 refills | Status: DC

## 2023-08-11 DIAGNOSIS — I110 Hypertensive heart disease with heart failure: Secondary | ICD-10-CM | POA: Diagnosis not present

## 2023-08-11 DIAGNOSIS — J449 Chronic obstructive pulmonary disease, unspecified: Secondary | ICD-10-CM | POA: Diagnosis not present

## 2023-08-11 DIAGNOSIS — I4821 Permanent atrial fibrillation: Secondary | ICD-10-CM | POA: Diagnosis not present

## 2023-08-11 DIAGNOSIS — E1169 Type 2 diabetes mellitus with other specified complication: Secondary | ICD-10-CM | POA: Diagnosis not present

## 2023-08-12 ENCOUNTER — Encounter: Payer: Self-pay | Admitting: "Endocrinology

## 2023-08-12 ENCOUNTER — Ambulatory Visit (INDEPENDENT_AMBULATORY_CARE_PROVIDER_SITE_OTHER): Admitting: "Endocrinology

## 2023-08-12 VITALS — BP 120/78 | HR 60 | Ht 62.0 in | Wt 240.0 lb

## 2023-08-12 DIAGNOSIS — N184 Chronic kidney disease, stage 4 (severe): Secondary | ICD-10-CM | POA: Diagnosis not present

## 2023-08-12 DIAGNOSIS — I10 Essential (primary) hypertension: Secondary | ICD-10-CM

## 2023-08-12 DIAGNOSIS — Z794 Long term (current) use of insulin: Secondary | ICD-10-CM | POA: Diagnosis not present

## 2023-08-12 DIAGNOSIS — E782 Mixed hyperlipidemia: Secondary | ICD-10-CM

## 2023-08-12 DIAGNOSIS — E1122 Type 2 diabetes mellitus with diabetic chronic kidney disease: Secondary | ICD-10-CM

## 2023-08-12 LAB — POCT GLYCOSYLATED HEMOGLOBIN (HGB A1C): HbA1c, POC (controlled diabetic range): 6.3 % (ref 0.0–7.0)

## 2023-08-12 MED ORDER — HUMULIN R U-500 KWIKPEN 500 UNIT/ML ~~LOC~~ SOPN
PEN_INJECTOR | SUBCUTANEOUS | 2 refills | Status: DC
Start: 2023-08-12 — End: 2023-08-24

## 2023-08-12 NOTE — Progress Notes (Signed)
 08/12/2023  Endocrinology follow-up note   Subjective:    Patient ID: [name], female    DOB: 08-03-1955, PCP Wyvonna Heidelberg, MD   Past Medical History:  Diagnosis Date   Arthritis    Asthma    Atrial fibrillation [facility])    Carcinoid tumor determined by biopsy of lung 05/2018   Left lung   Chronic diastolic CHF (congestive heart failure) (HCC)    CKD (chronic kidney disease), stage III (HCC)    Diabetes mellitus type 2 in obese    Essential hypertension    GERD (gastroesophageal reflux disease)    Heart murmur    Iron  deficiency anemia 10/16/2010   Mental handicap 10/16/2010   Mild CAD 2013   Morbid obesity (HCC)    OSA (obstructive sleep apnea)    Past Surgical History:  Procedure Laterality Date   ABDOMINAL HYSTERECTOMY     AV FISTULA PLACEMENT Right 06/06/2018   Procedure: ARTERIOVENOUS (AV) FISTULA CREATION;  Surgeon: Young Hensen, MD;  Location: [facility] OR;  Service: Vascular;  Laterality: Right;   BIOPSY  10/16/2021   Procedure: BIOPSY;  Surgeon: Suzette Espy, MD;  Location: [facility];  Service: Endoscopy;;   BRONCHIAL DILITATION  12/13/2020   Procedure: BRONCHIAL DILITATION;  Surgeon: Prudy Brownie, DO;  Location: [facility] ENDOSCOPY;  Service: Pulmonary;;   CESAREAN SECTION     CHOLECYSTECTOMY     COLONOSCOPY  08/2010   normal TI, sigmoid polyp (adenoma ). Next TCS due  08/2015,   COLONOSCOPY WITH PROPOFOL  N/A 07/08/2020   Procedure: COLONOSCOPY WITH PROPOFOL ;  Surgeon: Suzette Espy, MD;  Location: [facility];  Service: Endoscopy;  Laterality: N/A;  AM (diabetic and facility patient)   ESOPHAGOGASTRODUODENOSCOPY  08/2010   antral and duodenal erosions s/p bx (chronic gastritis, no h.pylori, no celiac dz ), hiatal hernia   ESOPHAGOGASTRODUODENOSCOPY (EGD) WITH PROPOFOL  N/A 10/16/2021   Procedure: ESOPHAGOGASTRODUODENOSCOPY (EGD) WITH PROPOFOL ;  Surgeon: Suzette Espy, MD;  [facility]: [facility];  Service: Endoscopy;  Laterality: N/A;   11:45am   ESOPHAGOGASTRODUODENOSCOPY (EGD) WITH PROPOFOL  N/A 12/18/2021   Procedure: ESOPHAGOGASTRODUODENOSCOPY (EGD) WITH PROPOFOL ;  Surgeon: Vinetta Greening, DO;  [facility]: [facility];  Service: Endoscopy;  Laterality: N/A;   ESOPHAGOGASTRODUODENOSCOPY (EGD) WITH PROPOFOL  N/A 01/19/2023   Procedure: ESOPHAGOGASTRODUODENOSCOPY (EGD) WITH PROPOFOL ;  Surgeon: Urban Garden, MD;  Location: [facility];  Service: Gastroenterology;  Laterality: N/A;  2:30 pm, asa 3   FISTULA SUPERFICIALIZATION Right 08/25/2018   Procedure: FISTULA SUPERFICIALIZATION RIGHT ARM;  Surgeon: Adine Hoof, MD;  Location: [facility] OR;  Service: Cardiovascular;  Laterality: Right;   HOT HEMOSTASIS  12/18/2021   Procedure: HOT HEMOSTASIS (ARGON PLASMA COAGULATION/BICAP);  Surgeon: Vinetta Greening, DO;  [facility];  Service: Endoscopy;;   HOT HEMOSTASIS  01/19/2023   Procedure: HOT HEMOSTASIS (ARGON PLASMA COAGULATION/BICAP);  Surgeon: Umberto Ganong, Bearl Limes, MD;  Location: [facility];  Service: Gastroenterology;;   IR FLUORO GUIDE CV LINE RIGHT  05/31/2018   IR US  GUIDE VASC ACCESS RIGHT  05/31/2018   KNEE SURGERY     right knee @ 68 years of age   LEFT AND RIGHT HEART CATHETERIZATION WITH CORONARY ANGIOGRAM N/A 09/21/2011   Procedure: LEFT AND RIGHT HEART CATHETERIZATION WITH CORONARY ANGIOGRAM;  Surgeon: Darrold Emms, MD;  Location: [facility];  Service: Cardiovascular;  Laterality: N/A;   POLYPECTOMY  07/08/2020   Procedure: POLYPECTOMY INTESTINAL;  Surgeon: Suzette Espy, MD;  Location:  [facility];  Service: Endoscopy;;   VIDEO BRONCHOSCOPY Left 12/13/2020   Procedure: VIDEO BRONCHOSCOPY WITHOUT FLUORO;  Surgeon: Prudy Brownie, DO;  Location: [facility];  Service: Pulmonary;  Laterality: Left;  Cryotherapy   Social History   Socioeconomic History   Marital status: Divorced    Spouse name: Not on file   Number of children: 1   Years of education: Not on file    Highest education level: Not on file  Occupational History    Employer: UNEMPLOYED  Tobacco Use   Smoking status: Former    Current packs/day: 0.00    Types: Cigarettes    Start date: 04/11/1996    Quit date: 04/11/1996    Years since quitting: 27.3   Smokeless tobacco: Never   Tobacco comments:    quit a couple year ago  Vaping Use   Vaping status: Never Used  Substance and Sexual Activity   Alcohol  use: No   Drug use: No   Sexual activity: Never  Other Topics Concern   Not on file  Social History Narrative   Lives with parents.    Social Drivers of Corporate investment banker Strain: Not on file  Food Insecurity: No Food Insecurity (12/17/2021)   Hunger Vital Sign    Worried About Running Out of Food in the Last Year: Never true    Ran Out of Food in the Last Year: Never true  Transportation Needs: No Transportation Needs (12/17/2021)   PRAPARE - Administrator, Civil Service (Medical): No    Lack of Transportation (Non-Medical): No  Physical Activity: Not on file  Stress: Not on file  Social Connections: Not on file   Outpatient Encounter Medications as of 08/12/2023  Medication Sig   acetaminophen  (TYLENOL ) 325 MG tablet Take 325 mg by mouth every 12 (twelve) hours as needed for moderate pain (pain score 4-6).   albuterol  (PROVENTIL  HFA;VENTOLIN  HFA) 108 (90 Base) MCG/ACT inhaler Inhale 2 puffs into the lungs every 4 (four) hours as needed for shortness of breath.   apixaban  (ELIQUIS ) 5 MG TABS tablet Take 1 tablet (5 mg total) by mouth 2 (two) times daily.   calcitRIOL  (ROCALTROL ) 0.25 MCG capsule Take 0.25 mcg by mouth every Monday, Wednesday, and Friday.   cetirizine  (ZYRTEC ) 10 MG tablet Take 10 mg by mouth daily.   Cholecalciferol  (VITAMIN D3) 50 MCG (2000 UT) TABS TAKE 1 TABLET BY MOUTH ONCE DAILY.   cyanocobalamin  (VITAMIN B12) 1000 MCG tablet Take 1 tablet (1,000 mcg total) by mouth daily.   diltiazem  (CARDIZEM  CD) 240 MG 24 hr capsule Take 1 capsule  (240 mg total) by mouth daily.   DROPSAFE SAFETY PEN NEEDLES 31G X 6 MM MISC    ferrous sulfate  325 (65 FE) MG tablet Take 1 tablet (325 mg total) by mouth every other day.   Finerenone  (KERENDIA ) 10 MG TABS Take 10 mg by mouth daily.   fluticasone  (FLONASE ) 50 MCG/ACT nasal spray Place 2 sprays into both nostrils daily as needed for allergies.   folic acid  (FOLVITE ) 1 MG tablet Take 1 tablet (1 mg total) by mouth daily.   furosemide  (LASIX ) 40 MG tablet TAKE 1 TABLET BY MOUTH EVERY OTHER DAY,ALTERNATE WITH 20 MG TABLET.   GERI-TUSSIN DM 10-100 MG/5ML liquid Take by mouth.   glucose blood (EASYMAX TEST) test strip CHECK BLOOD SUGAR 4 TIMES A DAY. (BREAKFAST, LUNCH, SUPPER & BEDTIME)   hydrOXYzine (ATARAX) 10 MG tablet Take 10 mg by mouth 3 (  three) times daily.   insulin  regular human CONCENTRATED (HUMULIN  R U-500 KWIKPEN) 500 UNIT/ML KwikPen Inject 50 units with breakfast, 20 units with lunch, and 40 units with supper only if pre-meal glucose is above 90 and only if she eats.   ipratropium-albuterol  (DUONEB) 0.5-2.5 (3) MG/3ML SOLN Take 3 mLs by nebulization every 6 (six) hours as needed (for cough).   isosorbide  dinitrate (ISORDIL ) 10 MG tablet Take 1 tablet (10 mg total) by mouth 3 (three) times daily.   Lancets 30G MISC Use to check blood glucose four times daily as instructed.   loperamide (IMODIUM) 2 MG capsule Take 2-4 mg by mouth See admin instructions. Take 4 mg by mouth now: then 2 mg after each loose stool as needed, do not exceed 5 doses in 24 hours.   losartan  (COZAAR ) 25 MG tablet Take 12.5 mg by mouth daily.   metolazone (ZAROXOLYN) 5 MG tablet Take 5 mg by mouth in the morning.   metoprolol  tartrate (LOPRESSOR ) 50 MG tablet Take 50 mg by mouth 2 (two) times daily.   montelukast  (SINGULAIR ) 10 MG tablet Take 10 mg by mouth at bedtime.   pantoprazole  (PROTONIX ) 40 MG tablet Take 1 tablet (40 mg total) by mouth 2 (two) times daily before a meal.   polyethylene glycol powder  (GLYCOLAX /MIRALAX ) 17 GM/SCOOP powder Take 17 g by mouth daily as needed for mild constipation or moderate constipation.   potassium chloride  SA (KLOR-CON  M) 20 MEQ tablet Take 20 mEq by mouth daily.   rosuvastatin  (CRESTOR ) 20 MG tablet TAKE (1) TABLET BY MOUTH ONCE DAILY. (Patient taking differently: Take 10 mg by mouth daily.)   TRELEGY ELLIPTA 100-62.5-25 MCG/ACT AEPB Inhale 1 puff into the lungs daily.   triamcinolone cream (KENALOG) 0.5 % Apply 1 application  topically every 12 (twelve) hours as needed (applied to bilateral legs for allergic dermatitis).   [DISCONTINUED] insulin  regular human CONCENTRATED (HUMULIN  R U-500 KWIKPEN) 500 UNIT/ML KwikPen Inject 30-40 Units into the skin 3 (three) times daily with meals. Inject 40 units with breakfast, 30 units with lunch, and 40 units with supper only if pre-meal glucose is above 90 and only if she eats.   No facility-administered encounter medications on file as of 08/12/2023.   ALLERGIES: No Known Allergies VACCINATION STATUS: Immunization History  Administered Date(s) Administered   Hepatitis B, ADULT 07/29/2018, 08/29/2018, 10/07/2018   Influenza Inj Mdck Quad Pf 12/19/2016   Influenza,inj,Quad PF,6+ Mos 02/02/2013, 05/15/2018   Influenza-Unspecified 05/15/2018   PPD Test 07/22/2018   Pneumococcal Polysaccharide-23 05/15/2018   Pneumococcal-Unspecified 05/15/2018    Diabetes She presents for her follow-up diabetic visit. She has type 2 diabetes mellitus. Onset time: she was diagnosed at approximate age of 65 years. Her disease course has been fluctuating. There are no hypoglycemic associated symptoms. Pertinent negatives for hypoglycemia include no confusion, headaches, pallor or seizures. Pertinent negatives for diabetes include no blurred vision, no chest pain, no fatigue, no polydipsia, no polyphagia and no polyuria. There are no hypoglycemic complications. Symptoms are worsening. Diabetic complications include nephropathy. Risk  factors for coronary artery disease include diabetes mellitus, dyslipidemia, hypertension, obesity and sedentary lifestyle. Current diabetic treatment includes intensive insulin  program. Her weight is decreasing steadily. She is following a generally unhealthy diet. She has had a previous visit with a dietitian. She never participates in exercise. Her home blood glucose trend is fluctuating minimally. Her breakfast blood glucose range is generally 140-180 mg/dl. Her lunch blood glucose range is generally 140-180 mg/dl. Her dinner blood glucose range  is generally 110-130 mg/dl. Her bedtime blood glucose range is generally 180-200 mg/dl. Her overall blood glucose range is 140-180 mg/dl. (She presents with fluctuating glycemic profile: Above target fasting and prelunch, tight before supper and above target at bedtime. Her glycemic profile continues to be discordant with her measured A1c of 6.3%.    This is due to her ongoing anemia.  She does not report significant hypoglycemia.   ) An ACE inhibitor/angiotensin II receptor blocker is being taken.  Hypertension This is a chronic problem. The current episode started more than 1 year ago. The problem is uncontrolled. Pertinent negatives include no blurred vision, chest pain, headaches, palpitations or shortness of breath. Risk factors for coronary artery disease include diabetes mellitus, dyslipidemia, obesity and sedentary lifestyle. Past treatments include ACE inhibitors. Hypertensive end-organ damage includes kidney disease. Identifiable causes of hypertension include chronic renal disease.  Hyperlipidemia This is a chronic problem. The current episode started more than 1 year ago. The problem is uncontrolled. Exacerbating diseases include chronic renal disease, diabetes and obesity. Pertinent negatives include no chest pain, myalgias or shortness of breath. Current antihyperlipidemic treatment includes statins and fibric acid derivatives. Risk factors for  coronary artery disease include diabetes mellitus, dyslipidemia, obesity, a sedentary lifestyle and hypertension.    Objective:    BP 120/78   Pulse 60   Ht 5\' 2"  (1.575 m)   Wt 240 lb (108.9 kg)   BMI 43.90 kg/m   Wt Readings from Last 3 Encounters:  08/12/23 240 lb (108.9 kg)  07/27/23 246 lb 3.2 oz (111.7 kg)  07/27/23 245 lb 3.2 oz (111.2 kg)      Results for orders placed or performed in visit on 08/12/23  HgB A1c   Collection Time: 08/12/23 11:24 AM  Result Value Ref Range   Hemoglobin A1C     HbA1c POC (<> result, manual entry)     HbA1c, POC (prediabetic range)     HbA1c, POC (controlled diabetic range) 6.3 0.0 - 7.0 %   Complete Blood Count (Most recent): Lab Results  Component Value Date   WBC 7.9 07/19/2023   HGB 9.1 (L) 07/19/2023   HCT 29.6 (L) 07/19/2023   MCV 95.5 07/19/2023   PLT 327 07/19/2023    Diabetic Labs (most recent): Lab Results  Component Value Date   HGBA1C 6.3 08/12/2023   HGBA1C 5.8 03/04/2023   HGBA1C 5.4 10/13/2022   MICROALBUR 150 04/25/2020   MICROALBUR 23.4 09/12/2019   MICROALBUR 0.2 07/13/2017   Lipid Panel     Component Value Date/Time   CHOL 107 10/06/2022 0802   TRIG 133 10/06/2022 0802   HDL 42 10/06/2022 0802   CHOLHDL 2.5 10/06/2022 0802   CHOLHDL 5.5 (H) 09/12/2019 0831   VLDL 59 (H) 06/02/2018 0029   LDLCALC 42 10/06/2022 0802   LDLCALC 124 (H) 09/12/2019 0831    Assessment & Plan:   1. Type 2 diabetes mellitus with stage 3-4 chronic kidney disease, with long-term current use of insulin    -Her diabetes is  complicated by CKD, congestive heart failure,  obesity/sedentary life, and patient remains at extremely high risk for more acute and chronic complications of diabetes which include CAD, CVA, CKD, retinopathy, and neuropathy. These are all discussed in detail with the patient.  She presents with fluctuating glycemic profile: Above target fasting and prelunch, tight before supper and above target at  bedtime. Her glycemic profile continues to be discordant with her measured A1c of 6.3%.    This is due  to her ongoing anemia.  She does not report significant hypoglycemia.  - I have re-counseled the patient on diet management and weight loss  by adopting a carbohydrate restricted / protein rich  Diet.   - she acknowledges that there is a room for improvement in her food and drink choices. - Suggestion is made for her to avoid simple carbohydrates  from her diet including Cakes, Sweet Desserts, Ice Cream, Soda (diet and regular), Sweet Tea, Candies, Chips, Cookies, Store Bought Juices, Alcohol  in Excess of  1-2 drinks a day, Artificial Sweeteners,  Coffee Creamer, and "Sugar-free" Products, Lemonade. This will help patient to have more stable blood glucose profile and potentially avoid unintended weight gain.  - Patient is advised to stick to a routine mealtimes to eat 3 meals  a day and avoid unnecessary snacks ( to snack only to correct hypoglycemia).  - I have approached patient with the following individualized plan to manage diabetes and patient agrees.   -Her presentation with above target glycemia but point-of-care A1c of 6.3% is a reflection of her anemia with hemoglobin of 9.1.   -It is better for her to stay on Humulin  RU 500 insulin  for the sake of simplicity.  -She is advised to increase Humulin   U500 to 50 units at breakfast, decrease to 20 units at lunch, 40 units with supper when glucose is above 90 mg/dl associated with monitoring of blood glucose before meals and at bedtime. - She is advised to skip insulin  if pre-meal blood glucose is below 90 mg/dL or if she is not eating. -She is not a candidate for metformin .   - Patient specific target  for A1c; LDL, HDL, Triglycerides, were discussed in detail.  2) BP/HTN:  - Her blood pressure is controlled to target.  She has urine microalbuminuria .She is advised on  salt restriction  and I advised her to continue current  medications including lisinopril  20 mg p.o. daily, hydralazine  25 mg p.o. 3 times daily, furosemide  40 mg p.o. Daily.  3) Lipids/HPL: Her recent lipid panel showed significantly controlled LDL at 42.   She is advised to continue Crestor  20 mg p.o. nightly.    Side effects and precautions discussed with her.    She is also on fenofibrate .     4)  Weight/Diet: Her BMI is 44.19-recently losing weight progressively.  She is still a candidate for modest weight loss.   CDE consult in progress, exercise, and carbohydrates information provided.  5) Chronic Care/Health Maintenance:  -Patient is  on ACEI and encouraged to continue to follow up with Ophthalmology, Podiatrist at least yearly or according to recommendations, and advised to stay away from smoking. I have recommended yearly flu vaccine and pneumonia vaccination at least every 5 years;  and  sleep for at least 7 hours a day.    -She is advised to maintain close follow-up with her nephrologist.  - I advised patient to maintain close follow up with [name], Tesfaye Demissie, MD for primary care needs.   I spent  26  minutes in the care of the patient today including review of labs from CMP, Lipids, Thyroid  Function, Hematology (current and previous including abstractions from other facilities); face-to-face time discussing  her blood glucose readings/logs, discussing hypoglycemia and hyperglycemia episodes and symptoms, medications doses, her options of short and long term treatment based on the latest standards of care / guidelines;  discussion about incorporating lifestyle medicine;  and documenting the encounter. Risk reduction counseling performed per USPSTF guidelines  to reduce  obesity and cardiovascular risk factors.     Please refer to Patient Instructions for Blood Glucose Monitoring and Insulin /Medications Dosing Guide"  in media tab for additional information. Please  also refer to " Patient Self Inventory" in the Media  tab for reviewed  elements of pertinent patient history.  [name] participated in the discussions, expressed understanding, and voiced agreement with the above plans.  All questions were answered to her satisfaction. she is encouraged to contact clinic should she have any questions or concerns prior to her return visit.     Follow up plan: -Return in about 4 months (around 12/13/2023) for Bring Meter/CGM Device/Logs- A1c in Office.  Kalvin Orf, MD Phone: [phone]  Fax: [phone]  This note was partially dictated with voice recognition software. Similar sounding words can be transcribed inadequately or may not  be corrected upon review.  08/12/2023, 12:46 PM

## 2023-08-12 NOTE — Patient Instructions (Signed)

## 2023-08-16 ENCOUNTER — Inpatient Hospital Stay

## 2023-08-16 DIAGNOSIS — D509 Iron deficiency anemia, unspecified: Secondary | ICD-10-CM | POA: Diagnosis not present

## 2023-08-16 LAB — CBC WITH DIFFERENTIAL/PLATELET
Abs Immature Granulocytes: 0.04 10*3/uL (ref 0.00–0.07)
Basophils Absolute: 0.1 10*3/uL (ref 0.0–0.1)
Basophils Relative: 1 %
Eosinophils Absolute: 0.1 10*3/uL (ref 0.0–0.5)
Eosinophils Relative: 1 %
HCT: 27.7 % — ABNORMAL LOW (ref 36.0–46.0)
Hemoglobin: 9.1 g/dL — ABNORMAL LOW (ref 12.0–15.0)
Immature Granulocytes: 0 %
Lymphocytes Relative: 10 %
Lymphs Abs: 1 10*3/uL (ref 0.7–4.0)
MCH: 31.3 pg (ref 26.0–34.0)
MCHC: 32.9 g/dL (ref 30.0–36.0)
MCV: 95.2 fL (ref 80.0–100.0)
Monocytes Absolute: 0.9 10*3/uL (ref 0.1–1.0)
Monocytes Relative: 10 %
Neutro Abs: 7.1 10*3/uL (ref 1.7–7.7)
Neutrophils Relative %: 78 %
Platelets: 267 10*3/uL (ref 150–400)
RBC: 2.91 MIL/uL — ABNORMAL LOW (ref 3.87–5.11)
RDW: 15.7 % — ABNORMAL HIGH (ref 11.5–15.5)
WBC: 9.2 10*3/uL (ref 4.0–10.5)
nRBC: 0 % (ref 0.0–0.2)

## 2023-08-16 LAB — COMPREHENSIVE METABOLIC PANEL WITH GFR
ALT: 9 U/L (ref 0–44)
AST: 15 U/L (ref 15–41)
Albumin: 3.4 g/dL — ABNORMAL LOW (ref 3.5–5.0)
Alkaline Phosphatase: 74 U/L (ref 38–126)
Anion gap: 11 (ref 5–15)
BUN: 58 mg/dL — ABNORMAL HIGH (ref 8–23)
CO2: 25 mmol/L (ref 22–32)
Calcium: 9.1 mg/dL (ref 8.9–10.3)
Chloride: 94 mmol/L — ABNORMAL LOW (ref 98–111)
Creatinine, Ser: 2.21 mg/dL — ABNORMAL HIGH (ref 0.44–1.00)
GFR, Estimated: 24 mL/min — ABNORMAL LOW (ref >60)
Glucose, Bld: 237 mg/dL — ABNORMAL HIGH (ref 70–99)
Potassium: 3.8 mmol/L (ref 3.5–5.1)
Sodium: 130 mmol/L — ABNORMAL LOW (ref 135–145)
Total Bilirubin: 0.6 mg/dL (ref 0.0–1.2)
Total Protein: 7.5 g/dL (ref 6.5–8.1)

## 2023-08-16 LAB — FERRITIN: Ferritin: 268 ng/mL (ref 11–307)

## 2023-08-16 LAB — VITAMIN B12: Vitamin B-12: 1068 pg/mL — ABNORMAL HIGH (ref 180–914)

## 2023-08-16 LAB — IRON AND TIBC
Iron: 23 ug/dL — ABNORMAL LOW (ref 28–170)
Saturation Ratios: 9 % — ABNORMAL LOW (ref 10.4–31.8)
TIBC: 259 ug/dL (ref 250–450)
UIBC: 236 ug/dL

## 2023-08-16 LAB — FOLATE: Folate: 37.9 ng/mL (ref >5.9)

## 2023-08-16 NOTE — Telephone Encounter (Signed)
 Ok to proceed with colonoscopy and upper endoscopy as outlined with below orders.

## 2023-08-17 NOTE — Telephone Encounter (Signed)
 LMOVM to call back for Chance

## 2023-08-20 ENCOUNTER — Other Ambulatory Visit: Payer: Self-pay | Admitting: "Endocrinology

## 2023-08-20 DIAGNOSIS — E1122 Type 2 diabetes mellitus with diabetic chronic kidney disease: Secondary | ICD-10-CM

## 2023-08-25 ENCOUNTER — Inpatient Hospital Stay (HOSPITAL_BASED_OUTPATIENT_CLINIC_OR_DEPARTMENT_OTHER): Admitting: Oncology

## 2023-08-25 VITALS — BP 93/50 | HR 54 | Temp 97.5°F | Resp 20 | Wt 244.2 lb

## 2023-08-25 DIAGNOSIS — D3A090 Benign carcinoid tumor of the bronchus and lung: Secondary | ICD-10-CM | POA: Diagnosis not present

## 2023-08-25 DIAGNOSIS — D509 Iron deficiency anemia, unspecified: Secondary | ICD-10-CM | POA: Diagnosis not present

## 2023-08-25 DIAGNOSIS — D500 Iron deficiency anemia secondary to blood loss (chronic): Secondary | ICD-10-CM | POA: Diagnosis not present

## 2023-08-25 NOTE — Progress Notes (Unsigned)
 [facility] at [facility]  HEMATOLOGY FOLLOW-UP VISIT  [name], Anne Awe, [name]  REASON FOR FOLLOW-UP: Iron  deficiency anemia  ASSESSMENT & PLAN:  Patient is a 68 year old female following for iron  deficiency anemia.    No problem-specific Assessment & Plan notes found for this encounter.     No orders of the defined types were placed in this encounter.   The total time spent in the appointment was 30 minutes encounter with patients including review of chart and various tests results, discussions about plan of care and coordination of care plan   All questions were answered. The patient knows to call the clinic with any problems, questions or concerns. No barriers to learning was detected.  Eduardo Grade, [name] 5/28/20251:17 PM   SUMMARY OF HEMATOLOGIC HISTORY: -Iron  deficiency anemia likely secondary to GI bleeding -S/p IV Monoferric  1000 mg on 02/23/2023 and 05/06/2023. - SPEP: No M spike, IFE: Normal, slight elevated free light chains with normal FLC ratio.   INTERVAL HISTORY: [name] 68 y.o. female following for iron  deficiency anemia.  Patient is accompanied by her guardian today.  She reports ongoing fatigue, but has some improvement since starting iron  supplementation.  She sees Dr. Carrolyn Clan for chronic kidney disease.  She has no other complaints today.  She denies melena, hematochezia, weight loss, loss of appetite, fevers, chills.  Overall feeling well.   I have reviewed the past medical history, past surgical history, social history and family history with the patient   ALLERGIES:  has no known allergies.  MEDICATIONS:  Current Outpatient Medications  Medication Sig Dispense Refill   acetaminophen  (TYLENOL ) 325 MG tablet Take 325 mg by mouth every 12 (twelve) hours as needed for moderate pain (pain score 4-6).     albuterol  (PROVENTIL  HFA;VENTOLIN  HFA) 108 (90 Base) MCG/ACT inhaler Inhale 2 puffs into the lungs every 4 (four)  hours as needed for shortness of breath.     apixaban  (ELIQUIS ) 5 MG TABS tablet Take 1 tablet (5 mg total) by mouth 2 (two) times daily. 60 tablet 3   calcitRIOL  (ROCALTROL ) 0.25 MCG capsule Take 0.25 mcg by mouth every Monday, Wednesday, and Friday.     cetirizine  (ZYRTEC ) 10 MG tablet Take 10 mg by mouth daily.     Cholecalciferol  (VITAMIN D3) 50 MCG (2000 UT) TABS TAKE 1 TABLET BY MOUTH ONCE DAILY. 30 tablet 2   cyanocobalamin  (VITAMIN B12) 1000 MCG tablet Take 1 tablet (1,000 mcg total) by mouth daily. 90 tablet 2   diltiazem  (CARDIZEM  CD) 240 MG 24 hr capsule Take 1 capsule (240 mg total) by mouth daily. 30 capsule 0   DROPSAFE SAFETY PEN NEEDLES 31G X 6 MM MISC      ferrous sulfate  325 (65 FE) MG tablet Take 1 tablet (325 mg total) by mouth every other day. 90 tablet 3   Finerenone  (KERENDIA ) 10 MG TABS Take 10 mg by mouth daily.     fluticasone  (FLONASE ) 50 MCG/ACT nasal spray Place 2 sprays into both nostrils daily as needed for allergies.     folic acid  (FOLVITE ) 1 MG tablet Take 1 tablet (1 mg total) by mouth daily. 60 tablet 2   furosemide  (LASIX ) 40 MG tablet TAKE 1 TABLET BY MOUTH EVERY OTHER DAY,ALTERNATE WITH 20 MG TABLET. 90 tablet 0   GERI-TUSSIN DM 10-100 MG/5ML liquid Take by mouth.     glucose blood (EASYMAX TEST) test strip CHECK BLOOD SUGAR 4 TIMES A DAY. (BREAKFAST, LUNCH, SUPPER & BEDTIME) 200  strip 2   hydrOXYzine (ATARAX) 10 MG tablet Take 10 mg by mouth 3 (three) times daily.     insulin  regular human CONCENTRATED (HUMULIN  R U-500 KWIKPEN) 500 UNIT/ML KwikPen INJECT 50 UNITS SUBCUTANEOUSLY AT BREAKFAST, 20 UNITS AT LUNCH & 40 UNITS AT SUPPER; WHEN GLUCOSE IS ABOVE 90 & EATING.(HOLD IF BS BELOW 70: CALL [name] IF BS ABOVE 400) 12 mL 0   ipratropium-albuterol  (DUONEB) 0.5-2.5 (3) MG/3ML SOLN Take 3 mLs by nebulization every 6 (six) hours as needed (for cough).     isosorbide  dinitrate (ISORDIL ) 10 MG tablet Take 1 tablet (10 mg total) by mouth 3 (three) times daily. 90 tablet  0   Lancets 30G MISC Use to check blood glucose four times daily as instructed. 100 each 2   loperamide (IMODIUM) 2 MG capsule Take 2-4 mg by mouth See admin instructions. Take 4 mg by mouth now: then 2 mg after each loose stool as needed, do not exceed 5 doses in 24 hours.     losartan  (COZAAR ) 25 MG tablet Take 12.5 mg by mouth daily.     metolazone (ZAROXOLYN) 5 MG tablet Take 5 mg by mouth in the morning.     metoprolol  tartrate (LOPRESSOR ) 50 MG tablet Take 50 mg by mouth 2 (two) times daily.     montelukast  (SINGULAIR ) 10 MG tablet Take 10 mg by mouth at bedtime.     pantoprazole  (PROTONIX ) 40 MG tablet Take 1 tablet (40 mg total) by mouth 2 (two) times daily before a meal. 60 tablet 5   polyethylene glycol powder (GLYCOLAX /MIRALAX ) 17 GM/SCOOP powder Take 17 g by mouth daily as needed for mild constipation or moderate constipation.     potassium chloride  SA (KLOR-CON  M) 20 MEQ tablet Take 20 mEq by mouth daily.     rosuvastatin  (CRESTOR ) 20 MG tablet TAKE (1) TABLET BY MOUTH ONCE DAILY. (Patient taking differently: Take 10 mg by mouth daily.) 30 tablet 3   TRELEGY ELLIPTA 100-62.5-25 MCG/ACT AEPB Inhale 1 puff into the lungs daily.     triamcinolone cream (KENALOG) 0.5 % Apply 1 application  topically every 12 (twelve) hours as needed (applied to bilateral legs for allergic dermatitis).     No current facility-administered medications for this visit.     REVIEW OF SYSTEMS:   Constitutional: Denies fevers, chills or night sweats Eyes: Denies blurriness of vision Ears, nose, mouth, throat, and face: Denies mucositis or sore throat Respiratory: Denies cough, dyspnea or wheezes Cardiovascular: Denies palpitation, chest discomfort or lower extremity swelling Gastrointestinal:  Denies nausea, heartburn or change in bowel habits Skin: Denies abnormal skin rashes Lymphatics: Denies new lymphadenopathy or easy bruising Neurological:Denies numbness, tingling or new  weaknesses Behavioral/Psych: Mood is stable, no new changes  All other systems were reviewed with the patient and are negative.  PHYSICAL EXAMINATION:   Vitals:   08/25/23 1312  BP: (!) 93/50  Pulse: (!) 54  Resp: 20  Temp: (!) 97.5 F (36.4 C)  SpO2: 98%    GENERAL:alert, no distress and comfortable LUNGS: clear to auscultation and percussion with normal breathing effort HEART: regular rate & rhythm and no murmurs and no lower extremity edema ABDOMEN:abdomen soft, non-tender and normal bowel sounds Musculoskeletal:no cyanosis of digits and no clubbing  NEURO: alert & oriented x 3 with fluent speech  LABORATORY DATA:  I have reviewed the data as listed  Lab Results  Component Value Date   WBC 9.2 08/16/2023   NEUTROABS 7.1 08/16/2023   HGB 9.1 (L)  08/16/2023   HCT 27.7 (L) 08/16/2023   MCV 95.2 08/16/2023   PLT 267 08/16/2023       Chemistry      Component Value Date/Time   NA 130 (L) 08/16/2023 1357   NA 136 10/06/2022 0802   K 3.8 08/16/2023 1357   CL 94 (L) 08/16/2023 1357   CO2 25 08/16/2023 1357   BUN 58 (H) 08/16/2023 1357   BUN 53 (H) 10/06/2022 0802   CREATININE 2.21 (H) 08/16/2023 1357   CREATININE 2.39 (H) 01/27/2023 1043   CREATININE 1.57 (H) 09/12/2019 0831   GLU 169 05/07/2020 0000      Component Value Date/Time   CALCIUM  9.1 08/16/2023 1357   CALCIUM  7.8 (L) 05/31/2018 0609   ALKPHOS 74 08/16/2023 1357   AST 15 08/16/2023 1357   AST 9 (L) 01/27/2023 1043   ALT 9 08/16/2023 1357   ALT 5 01/27/2023 1043   BILITOT 0.6 08/16/2023 1357   BILITOT 0.4 01/27/2023 1043      Latest Reference Range & Units 08/16/23 13:57  Iron  28 - 170 ug/dL 23 (L)  UIBC ug/dL 161  TIBC 096 - 045 ug/dL 409  Saturation Ratios 10.4 - 31.8 % 9 (L)  Ferritin 11 - 307 ng/mL 268  Folate >5.9 ng/mL 37.9  Vitamin B12 180 - 914 pg/mL 1,068 (H)  (L): Data is abnormally low (H): Data is abnormally high   Latest Reference Range & Units 06/28/23 13:15  Total Protein  ELP 6.0 - 8.5 g/dL 6.4 (C)  Albumin SerPl Elph-Mcnc 2.9 - 4.4 g/dL 3.0 (C)  Albumin/Glob SerPl 0.7 - 1.7  0.9 (C)  Alpha2 Glob SerPl Elph-Mcnc 0.4 - 1.0 g/dL 1.0 (C)  Alpha 1 0.0 - 0.4 g/dL 0.4 (C)  Gamma Glob SerPl Elph-Mcnc 0.4 - 1.8 g/dL 1.2 (C)  M Protein SerPl Elph-Mcnc Not Observed g/dL Not Observed (C)  IFE 1  Comment (C)  Globulin, Total 2.2 - 3.9 g/dL 3.4 (C)  B-Globulin SerPl Elph-Mcnc 0.7 - 1.3 g/dL 0.9 (C)  IgG (Immunoglobin G), Serum 586 - 1,602 mg/dL 8,119  IgM (Immunoglobulin M), Srm 26 - 217 mg/dL 38  IgA 87 - 147 mg/dL 829  (C): Corrected IFE 1 Comment VC       Comment: (NOTE) The immunofixation pattern appears unremarkable. Evidence of monoclonal protein is not apparent.    Latest Reference Range & Units 06/28/23 13:15  Kappa free light chain 3.3 - 19.4 mg/L 80.4 (H)  Lambda free light chains 5.7 - 26.3 mg/L 53.0 (H)  Kappa, lambda light chain ratio 0.26 - 1.65  1.52  (H): Data is abnormally high

## 2023-08-25 NOTE — Patient Instructions (Signed)
 VISIT SUMMARY:  During your visit, we discussed your ongoing issues with iron  deficiency anemia, chronic kidney disease, and hyponatremia. We reviewed your current treatments and made some adjustments to better manage your conditions. We also discussed your upcoming colonoscopy and endoscopy procedures.  YOUR PLAN:  -IRON  DEFICIENCY ANEMIA: Iron  deficiency anemia is a condition where your body lacks enough iron  to produce healthy red blood cells. Your hemoglobin level is currently 9.1. We will administer an additional dose of IV iron  and continue your oral iron  supplementation every other day. We are coordinating with gastroenterology to schedule your colonoscopy and endoscopy to investigate potential gastrointestinal bleeding. We will monitor your hemoglobin levels and may consider additional treatments if necessary.  -CHRONIC KIDNEY DISEASE: Chronic kidney disease is a long-term condition where the kidneys do not work as well as they should. This can contribute to anemia. We will continue to monitor your iron  saturation levels to manage your anemia effectively. Your iron  saturation should be maintained between 25 to 34.  -HYPONATREMIA: Hyponatremia is a condition where your blood sodium levels are too low. You have been experiencing signs of dehydration. We encourage you to maintain adequate hydration by drinking enough fluids.  INSTRUCTIONS:  Please continue with your current medications and follow the hydration recommendations. We will coordinate with gastroenterology to confirm the dates for your colonoscopy and endoscopy. Monitor your hemoglobin levels regularly and report any significant changes in your condition. Follow up with Dr. Nita for your diabetes management.

## 2023-08-26 ENCOUNTER — Other Ambulatory Visit: Payer: Self-pay | Admitting: Cardiology

## 2023-08-26 NOTE — Telephone Encounter (Signed)
 Will forward to provider to review last kidney function result and confirm lasix  instructions.

## 2023-08-27 ENCOUNTER — Encounter: Payer: Self-pay | Admitting: Oncology

## 2023-08-27 NOTE — Assessment & Plan Note (Signed)
 Patient has chronic anemia likely secondary to iron  deficiency and chronic kidney disease.   Iron  levels improved with IV iron  with slight improvement in hemoglobin initially.  Currently, dropping iron  levels again. Patient is being worked up by GI and is scheduled for endoscopy and colonoscopy.  Awaiting confirmation on procedure date Multiple myeloma workup: SPEP: No M spike, IFE: Normal.  Slightly elevated free light chains with normal ratio  - Will proceed with IV iron  infusion. - If iron  deficiency resolves and patient is still anemic with hemoglobin less than 10, can consider starting erythropoietin  shots - Continue oral iron  every other day  Return to clinic in 6 weeks with labs to assess response for IV iron 

## 2023-08-27 NOTE — Assessment & Plan Note (Signed)
 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  She is asymptomatic currently.  -Will repeat CT scan in 1 year that is 12/2023 -Continue to monitor

## 2023-08-30 ENCOUNTER — Encounter: Payer: Self-pay | Admitting: *Deleted

## 2023-08-30 MED ORDER — SIMETHICONE 40 MG/0.6ML PO SUSP
ORAL | 0 refills | Status: DC
Start: 2023-08-30 — End: 2024-02-15

## 2023-08-30 MED ORDER — PEG 3350-KCL-NA BICARB-NACL 420 G PO SOLR: 4000.0000 mL | Freq: Once | ORAL | 0 refills | Status: AC

## 2023-08-30 MED ORDER — FLEET ENEMA RE ENEM: 1.0000 | ENEMA | RECTAL | 0 refills | Status: DC

## 2023-08-30 MED ORDER — BISACODYL EC 5 MG PO TBEC: DELAYED_RELEASE_TABLET | ORAL | 0 refills | Status: DC

## 2023-08-30 NOTE — Telephone Encounter (Signed)
 Lamond Pilot with [facility] called to schedule procedures. Scheduled for 7/9. Aware will fax instructions/pre-op  to her. Rx for prep sent to pharmacy.

## 2023-08-30 NOTE — Addendum Note (Signed)
 Addended by: Feliz Hosteller on: 08/30/2023 11:50 AM   Modules accepted: Orders

## 2023-08-31 ENCOUNTER — Inpatient Hospital Stay: Attending: Oncology

## 2023-08-31 VITALS — BP 97/50 | HR 44 | Temp 97.3°F | Resp 18 | Wt 237.9 lb

## 2023-08-31 DIAGNOSIS — D509 Iron deficiency anemia, unspecified: Secondary | ICD-10-CM | POA: Insufficient documentation

## 2023-08-31 DIAGNOSIS — D500 Iron deficiency anemia secondary to blood loss (chronic): Secondary | ICD-10-CM

## 2023-08-31 MED ORDER — CETIRIZINE HCL 10 MG/ML IV SOLN
10.0000 mg | Freq: Once | INTRAVENOUS | Status: AC
  Administered 2023-08-31: 10 mg via INTRAVENOUS
  Filled 2023-08-31: qty 1

## 2023-08-31 MED ORDER — SODIUM CHLORIDE 0.9 % IV SOLN: Freq: Once | INTRAVENOUS | Status: AC

## 2023-08-31 MED ORDER — SODIUM CHLORIDE 0.9 % IV SOLN
1000.0000 mg | Freq: Once | INTRAVENOUS | Status: AC
  Administered 2023-08-31: 1000 mg via INTRAVENOUS
  Filled 2023-08-31: qty 1000

## 2023-08-31 MED ORDER — ACETAMINOPHEN 325 MG PO TABS
650.0000 mg | ORAL_TABLET | Freq: Once | ORAL | Status: AC
  Administered 2023-08-31: 650 mg via ORAL
  Filled 2023-08-31: qty 2

## 2023-08-31 NOTE — Progress Notes (Signed)
 Patient presents today for iron  infusion.  Patient is in satisfactory condition with no new complaints voiced.  Vital signs are stable.  IV placed in L hand.  IV flushed well with good blood return noted.  We will proceed with infusion per provider orders.    Patient tolerated infusion well with no complaints voiced.  Patient left via wheelchair with nurse aid in stable condition.  Vital signs stable at discharge.  Follow up as scheduled.

## 2023-08-31 NOTE — Patient Instructions (Signed)
 [facility] [place] - A DEPT OF [facility]  Discharge Instructions: Thank you for choosing [facility] to provide your oncology and hematology care.  If you have a lab appointment with the Cancer Center - please note that after April 8th, 2024, all labs will be drawn in the cancer center.  You do not have to check in or register with the main entrance as you have in the past but will complete your check-in in the cancer center.  Wear comfortable clothing and clothing appropriate for easy access to any Portacath or PICC line.   We strive to give you quality time with your provider. You may need to reschedule your appointment if you arrive late (15 or more minutes).  Arriving late affects you and other patients whose appointments are after yours.  Also, if you miss three or more appointments without notifying the office, you may be dismissed from the clinic at the provider's discretion.      For prescription refill requests, have your pharmacy contact our office and allow 72 hours for refills to be completed.    Today you received the following:  Monoferric.  Ferric Derisomaltose Injection What is this medication? FERRIC DERISOMALTOSE (FER ik der EYE soe MAWL tose) treats low levels of iron in your body (iron deficiency anemia). Iron is a mineral that plays an important role in making red blood cells, which carry oxygen from your lungs to the rest of your body. This medicine may be used for other purposes; ask your health care provider or pharmacist if you have questions. COMMON BRAND NAME(S): MONOFERRIC What should I tell my care team before I take this medication? They need to know if you have any of these conditions: High levels of iron in the blood An unusual or allergic reaction to iron, other medications, foods, dyes, or preservatives Pregnant or trying to get pregnant Breastfeeding How should I use this medication? This medication is injected into  a vein. It is given by your care team in a hospital or clinic setting. Talk to your care team about the use of this medication in children. Special care may be needed. Overdosage: If you think you have taken too much of this medicine contact a poison control center or emergency room at once. NOTE: This medicine is only for you. Do not share this medicine with others. What if I miss a dose? It is important not to miss your dose. Call your care team if you are unable to keep an appointment. What may interact with this medication? Do not take this medication with any of the following: Deferoxamine Dimercaprol Other iron products This list may not describe all possible interactions. Give your health care provider a list of all the medicines, herbs, non-prescription drugs, or dietary supplements you use. Also tell them if you smoke, drink alcohol, or use illegal drugs. Some items may interact with your medicine. What should I watch for while using this medication? Visit your care team for regular checks on your progress. Tell your care team if your symptoms do not start to get better or if they get worse. You may need blood work done while you are taking this medication. You may need to eat more foods that contain iron. Talk to your care team. Foods that contain iron include whole grains or cereals, dried fruits, beans, peas, leafy green vegetables, and organ meats (liver, kidney). What side effects may I notice from receiving this medication? Side  effects that you should report to your care team as soon as possible: Allergic reactions--skin rash, itching, hives, swelling of the face, lips, tongue, or throat Low blood pressure--dizziness, feeling faint or lightheaded, blurry vision Shortness of breath Side effects that usually do not require medical attention (report to your care team if they continue or are bothersome): Flushing Headache Joint pain Muscle pain Nausea Pain, redness, or  irritation at injection site This list may not describe all possible side effects. Call your doctor for medical advice about side effects. You may report side effects to FDA at 1-800-FDA-1088. Where should I keep my medication? This medication is given in a hospital or clinic. It will not be stored at home. NOTE: This sheet is a summary. It may not cover all possible information. If you have questions about this medicine, talk to your doctor, pharmacist, or health care provider.  2024 Elsevier/Gold Standard (2022-11-04 00:00:00)       To help prevent nausea and vomiting after your treatment, we encourage you to take your nausea medication as directed.  BELOW ARE SYMPTOMS THAT SHOULD BE REPORTED IMMEDIATELY: *FEVER GREATER THAN 100.4 F (38 C) OR HIGHER *CHILLS OR SWEATING *NAUSEA AND VOMITING THAT IS NOT CONTROLLED WITH YOUR NAUSEA MEDICATION *UNUSUAL SHORTNESS OF BREATH *UNUSUAL BRUISING OR BLEEDING *URINARY PROBLEMS (pain or burning when urinating, or frequent urination) *BOWEL PROBLEMS (unusual diarrhea, constipation, pain near the anus) TENDERNESS IN MOUTH AND THROAT WITH OR WITHOUT PRESENCE OF ULCERS (sore throat, sores in mouth, or a toothache) UNUSUAL RASH, SWELLING OR PAIN  UNUSUAL VAGINAL DISCHARGE OR ITCHING   Items with * indicate a potential emergency and should be followed up as soon as possible or go to the Emergency Department if any problems should occur.  Please show the CHEMOTHERAPY ALERT CARD or IMMUNOTHERAPY ALERT CARD at check-in to the Emergency Department and triage nurse.  Should you have questions after your visit or need to cancel or reschedule your appointment, please contact [facility] [place] - A DEPT OF [facility] [phone]  and follow the prompts.  Office hours are 8:00 a.m. to 4:30 p.m. Monday - Friday. Please note that voicemails left after 4:00 p.m. may not be returned until the following business day.  We are closed weekends  and major holidays. You have access to a nurse at all times for urgent questions. Please call the main number to the clinic [phone] and follow the prompts.  For any non-urgent questions, you may also contact your provider using MyChart. We now offer e-Visits for anyone 70 and older to request care online for non-urgent symptoms. For details visit [email or web address].   Also download the MyChart app! Go to the app store, search "MyChart", open the app, select [facility], and log in with your MyChart username and password.

## 2023-09-11 DIAGNOSIS — I5032 Chronic diastolic (congestive) heart failure: Secondary | ICD-10-CM | POA: Diagnosis not present

## 2023-09-11 DIAGNOSIS — N184 Chronic kidney disease, stage 4 (severe): Secondary | ICD-10-CM | POA: Diagnosis not present

## 2023-09-22 DIAGNOSIS — E1169 Type 2 diabetes mellitus with other specified complication: Secondary | ICD-10-CM | POA: Diagnosis not present

## 2023-09-22 DIAGNOSIS — I5032 Chronic diastolic (congestive) heart failure: Secondary | ICD-10-CM | POA: Diagnosis not present

## 2023-09-22 DIAGNOSIS — I110 Hypertensive heart disease with heart failure: Secondary | ICD-10-CM | POA: Diagnosis not present

## 2023-09-22 DIAGNOSIS — J449 Chronic obstructive pulmonary disease, unspecified: Secondary | ICD-10-CM | POA: Diagnosis not present

## 2023-09-22 DIAGNOSIS — J029 Acute pharyngitis, unspecified: Secondary | ICD-10-CM | POA: Diagnosis not present

## 2023-09-27 ENCOUNTER — Other Ambulatory Visit: Payer: Self-pay | Admitting: Oncology

## 2023-09-30 ENCOUNTER — Ambulatory Visit: Payer: Self-pay | Admitting: *Deleted

## 2023-09-30 ENCOUNTER — Inpatient Hospital Stay

## 2023-09-30 ENCOUNTER — Inpatient Hospital Stay: Attending: Oncology | Admitting: Oncology

## 2023-09-30 ENCOUNTER — Other Ambulatory Visit: Payer: Self-pay | Admitting: Hematology

## 2023-09-30 DIAGNOSIS — D509 Iron deficiency anemia, unspecified: Secondary | ICD-10-CM | POA: Insufficient documentation

## 2023-09-30 DIAGNOSIS — N189 Chronic kidney disease, unspecified: Secondary | ICD-10-CM | POA: Insufficient documentation

## 2023-09-30 DIAGNOSIS — I4891 Unspecified atrial fibrillation: Secondary | ICD-10-CM | POA: Insufficient documentation

## 2023-09-30 DIAGNOSIS — Z79899 Other long term (current) drug therapy: Secondary | ICD-10-CM | POA: Insufficient documentation

## 2023-09-30 DIAGNOSIS — Z7901 Long term (current) use of anticoagulants: Secondary | ICD-10-CM | POA: Insufficient documentation

## 2023-09-30 DIAGNOSIS — D500 Iron deficiency anemia secondary to blood loss (chronic): Secondary | ICD-10-CM

## 2023-09-30 LAB — IRON AND TIBC
Iron: 51 ug/dL (ref 28–170)
Saturation Ratios: 18 % (ref 10.4–31.8)
TIBC: 291 ug/dL (ref 250–450)
UIBC: 240 ug/dL

## 2023-09-30 LAB — COMPREHENSIVE METABOLIC PANEL WITH GFR
ALT: 12 U/L (ref 0–44)
AST: 18 U/L (ref 15–41)
Albumin: 3.1 g/dL — ABNORMAL LOW (ref 3.5–5.0)
Alkaline Phosphatase: 67 U/L (ref 38–126)
Anion gap: 14 (ref 5–15)
BUN: 54 mg/dL — ABNORMAL HIGH (ref 8–23)
CO2: 26 mmol/L (ref 22–32)
Calcium: 9 mg/dL (ref 8.9–10.3)
Chloride: 93 mmol/L — ABNORMAL LOW (ref 98–111)
Creatinine, Ser: 2 mg/dL — ABNORMAL HIGH (ref 0.44–1.00)
GFR, Estimated: 27 mL/min — ABNORMAL LOW (ref >60)
Glucose, Bld: 238 mg/dL — ABNORMAL HIGH (ref 70–99)
Potassium: 2.8 mmol/L — ABNORMAL LOW (ref 3.5–5.1)
Sodium: 133 mmol/L — ABNORMAL LOW (ref 135–145)
Total Bilirubin: 0.4 mg/dL (ref 0.0–1.2)
Total Protein: 6.7 g/dL (ref 6.5–8.1)

## 2023-09-30 LAB — CBC WITH DIFFERENTIAL/PLATELET
Abs Immature Granulocytes: 0.11 10*3/uL — ABNORMAL HIGH (ref 0.00–0.07)
Basophils Absolute: 0.1 10*3/uL (ref 0.0–0.1)
Basophils Relative: 1 %
Eosinophils Absolute: 0.2 10*3/uL (ref 0.0–0.5)
Eosinophils Relative: 3 %
HCT: 22.5 % — ABNORMAL LOW (ref 36.0–46.0)
Hemoglobin: 6.9 g/dL — CL (ref 12.0–15.0)
Immature Granulocytes: 2 %
Lymphocytes Relative: 11 %
Lymphs Abs: 0.7 10*3/uL (ref 0.7–4.0)
MCH: 30.5 pg (ref 26.0–34.0)
MCHC: 30.7 g/dL (ref 30.0–36.0)
MCV: 99.6 fL (ref 80.0–100.0)
Monocytes Absolute: 0.4 10*3/uL (ref 0.1–1.0)
Monocytes Relative: 6 %
Neutro Abs: 5.5 10*3/uL (ref 1.7–7.7)
Neutrophils Relative %: 77 %
Platelets: 303 10*3/uL (ref 150–400)
RBC: 2.26 MIL/uL — ABNORMAL LOW (ref 3.87–5.11)
RDW: 15.9 % — ABNORMAL HIGH (ref 11.5–15.5)
WBC: 6.9 10*3/uL (ref 4.0–10.5)
nRBC: 0 % (ref 0.0–0.2)

## 2023-09-30 LAB — FOLATE: Folate: >40 ng/mL (ref >5.9)

## 2023-09-30 LAB — FERRITIN: Ferritin: 239 ng/mL (ref 11–307)

## 2023-09-30 LAB — PREPARE RBC (CROSSMATCH)

## 2023-09-30 LAB — VITAMIN B12: Vitamin B-12: 1024 pg/mL — ABNORMAL HIGH (ref 180–914)

## 2023-09-30 MED ORDER — SODIUM CHLORIDE 0.9% IV SOLUTION
250.0000 mL | INTRAVENOUS | Status: DC
  Administered 2023-09-30: 100 mL via INTRAVENOUS

## 2023-09-30 MED ORDER — ACETAMINOPHEN 325 MG PO TABS
650.0000 mg | ORAL_TABLET | Freq: Once | ORAL | Status: AC
  Administered 2023-09-30: 650 mg via ORAL
  Filled 2023-09-30: qty 2

## 2023-09-30 MED ORDER — DIPHENHYDRAMINE HCL 25 MG PO CAPS
25.0000 mg | ORAL_CAPSULE | Freq: Once | ORAL | Status: AC
  Administered 2023-09-30: 25 mg via ORAL
  Filled 2023-09-30: qty 1

## 2023-09-30 NOTE — Progress Notes (Signed)
 Patient on schedule for 2 units of PRBC

## 2023-09-30 NOTE — Progress Notes (Signed)
 Patient presents today for 2 units of PRBC's. MAR reviewed and updated. Vital signs stable. Patient has no complaints of chest pain, or dizziness. Patient has COPD and has shortness of breath and slight cough that has not changed in severity.   2 units of blood given today per MD orders. Tolerated infusion without adverse affects. Vital signs stable. No complaints at this time. Discharged from clinic wheel chair in stable condition. Alert and oriented x 3. F/U with [facility] as scheduled.

## 2023-09-30 NOTE — Patient Instructions (Signed)
 [facility] - A DEPT OF [facility]  Discharge Instructions: Thank you for choosing [facility] to provide your oncology and hematology care.  If you have a lab appointment with the Cancer Center - please note that after April 8th, 2024, all labs will be drawn in the cancer center.  You do not have to check in or register with the main entrance as you have in the past but will complete your check-in in the cancer center.  Wear comfortable clothing and clothing appropriate for easy access to any Portacath or PICC line.   We strive to give you quality time with your provider. You may need to reschedule your appointment if you arrive late (15 or more minutes).  Arriving late affects you and other patients whose appointments are after yours.  Also, if you miss three or more appointments without notifying the office, you may be dismissed from the clinic at the provider's discretion.      For prescription refill requests, have your pharmacy contact our office and allow 72 hours for refills to be completed.    Today you received the following chemotherapy and/or immunotherapy agents 2 units of blood products. Blood Transfusion, Adult, Care After After a blood transfusion, it is common to have: Bruising and soreness at the IV site. A headache. Follow these instructions at home: Your doctor may give you more instructions. If you have problems, contact your doctor. Insertion site care     Follow instructions from your doctor about how to take care of your insertion site. This is where an IV tube was put into your vein. Make sure you: Wash your hands with soap and water  for at least 20 seconds before and after you change your bandage. If you cannot use soap and water , use hand sanitizer. Change your bandage as told by your doctor. Check your insertion site every day for signs of infection. Check for: Redness, swelling, or pain. Bleeding from the  site. Warmth. Pus or a bad smell. General instructions Take over-the-counter and prescription medicines only as told by your doctor. Rest as told by your doctor. Go back to your normal activities as told by your doctor. Keep all follow-up visits. You may need to have tests at certain times to check your blood. Contact a doctor if: You have itching or red, swollen areas of skin (hives). You have a fever or chills. You have pain in the head, back, or chest. You feel worried or nervous (anxious). You feel weak after doing your normal activities. You have any of these problems at the insertion site: Redness, swelling, warmth, or pain. Bleeding that does not stop with pressure. Pus or a bad smell. If you received your blood transfusion in an outpatient setting, you will be told whom to contact to report any reactions. Get help right away if: You have signs of a serious reaction. This may be coming from an allergy or the body's defense system (immune system). Signs include: Trouble breathing or shortness of breath. Swelling of the face or feeling warm (flushed). A widespread rash. Dark pee (urine) or blood in the pee. Fast heartbeat. These symptoms may be an emergency. Get help right away. Call 911. Do not wait to see if the symptoms will go away. Do not drive yourself to the hospital. Summary Bruising and soreness at the IV site are common. Check your insertion site every day for signs of infection. Rest as told by your doctor.  Go back to your normal activities as told by your doctor. Get help right away if you have signs of a serious reaction. This information is not intended to replace advice given to you by your health care provider. Make sure you discuss any questions you have with your health care provider. Document Revised: 06/13/2021 Document Reviewed: 06/13/2021 Elsevier Patient Education  2024 Elsevier Inc.      To help prevent nausea and vomiting after your treatment, we  encourage you to take your nausea medication as directed.  BELOW ARE SYMPTOMS THAT SHOULD BE REPORTED IMMEDIATELY: *FEVER GREATER THAN 100.4 F (38 C) OR HIGHER *CHILLS OR SWEATING *NAUSEA AND VOMITING THAT IS NOT CONTROLLED WITH YOUR NAUSEA MEDICATION *UNUSUAL SHORTNESS OF BREATH *UNUSUAL BRUISING OR BLEEDING *URINARY PROBLEMS (pain or burning when urinating, or frequent urination) *BOWEL PROBLEMS (unusual diarrhea, constipation, pain near the anus) TENDERNESS IN MOUTH AND THROAT WITH OR WITHOUT PRESENCE OF ULCERS (sore throat, sores in mouth, or a toothache) UNUSUAL RASH, SWELLING OR PAIN  UNUSUAL VAGINAL DISCHARGE OR ITCHING   Items with * indicate a potential emergency and should be followed up as soon as possible or go to the Emergency Department if any problems should occur.  Please show the CHEMOTHERAPY ALERT CARD or IMMUNOTHERAPY ALERT CARD at check-in to the Emergency Department and triage nurse.  Should you have questions after your visit or need to cancel or reschedule your appointment, please contact [facility] [place] - A DEPT OF [facility] [phone]  and follow the prompts.  Office hours are 8:00 a.m. to 4:30 p.m. Monday - Friday. Please note that voicemails left after 4:00 p.m. may not be returned until the following business day.  We are closed weekends and major holidays. You have access to a nurse at all times for urgent questions. Please call the main number to the clinic [phone] and follow the prompts.  For any non-urgent questions, you may also contact your provider using MyChart. We now offer e-Visits for anyone 84 and older to request care online for non-urgent symptoms. For details visit [email or web address].   Also download the MyChart app! Go to the app store, search MyChart, open the app, select [facility], and log in with your MyChart username and password.

## 2023-09-30 NOTE — Progress Notes (Signed)
 CRITICAL VALUE ALERT Critical value received:  HGB 6.9 Date of notification:  09-30-2023 Time of notification: 09:25 am. Critical value read back:  Yes.   Nurse who received alert:  B.Lindy Pennisi RN.  MD notified time and response:  Dr. Katragadda @ 09:40 am.   Patient is a resident at [facility]. Called and spoke with nurse caring for patient. Understanding verbalized to bring patient back to the clinic for 2 units of blood. Blood bank, lab, scheduling and primary care nurse aware and understanding verbalized.

## 2023-10-01 LAB — TYPE AND SCREEN
ABO/RH(D): A POS
Antibody Screen: NEGATIVE
Unit division: 0
Unit division: 0

## 2023-10-01 LAB — BPAM RBC
Blood Product Expiration Date: 202508022359
Blood Product Expiration Date: 202508022359
ISSUE DATE / TIME: 202507031208
ISSUE DATE / TIME: 202507031349
Unit Type and Rh: 6200
Unit Type and Rh: 6200

## 2023-10-04 ENCOUNTER — Inpatient Hospital Stay (HOSPITAL_BASED_OUTPATIENT_CLINIC_OR_DEPARTMENT_OTHER): Admitting: Oncology

## 2023-10-04 VITALS — BP 140/80 | HR 74 | Temp 98.3°F | Resp 19 | Ht 62.0 in | Wt 243.5 lb

## 2023-10-04 DIAGNOSIS — N189 Chronic kidney disease, unspecified: Secondary | ICD-10-CM | POA: Diagnosis not present

## 2023-10-04 DIAGNOSIS — N185 Chronic kidney disease, stage 5: Secondary | ICD-10-CM

## 2023-10-04 DIAGNOSIS — Z79899 Other long term (current) drug therapy: Secondary | ICD-10-CM | POA: Diagnosis not present

## 2023-10-04 DIAGNOSIS — D500 Iron deficiency anemia secondary to blood loss (chronic): Secondary | ICD-10-CM | POA: Diagnosis not present

## 2023-10-04 DIAGNOSIS — D3A090 Benign carcinoid tumor of the bronchus and lung: Secondary | ICD-10-CM | POA: Diagnosis not present

## 2023-10-04 DIAGNOSIS — D509 Iron deficiency anemia, unspecified: Secondary | ICD-10-CM | POA: Diagnosis not present

## 2023-10-04 DIAGNOSIS — Z7901 Long term (current) use of anticoagulants: Secondary | ICD-10-CM | POA: Diagnosis not present

## 2023-10-04 DIAGNOSIS — I4891 Unspecified atrial fibrillation: Secondary | ICD-10-CM | POA: Diagnosis not present

## 2023-10-04 NOTE — Assessment & Plan Note (Signed)
 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  She is asymptomatic currently.  -Will repeat CT scan in 1 year that is 12/2023 -Continue to monitor

## 2023-10-04 NOTE — Progress Notes (Signed)
 [facility] at [facility]  HEMATOLOGY FOLLOW-UP VISIT  [name], [name] Area, MD  REASON FOR FOLLOW-UP: Iron  deficiency anemia  ASSESSMENT & PLAN:  Patient is a 68 year old female following for iron  deficiency anemia.    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  She is asymptomatic currently.  -Will repeat CT scan in 1 year that is 12/2023 -Continue to monitor  IDA (iron  deficiency anemia) Patient has chronic anemia likely secondary to iron  deficiency and chronic kidney disease.   Iron  levels improved with IV iron  with slight improvement in hemoglobin initially.  Currently, dropping iron  levels again. Patient is being worked up by GI and is scheduled for endoscopy and colonoscopy.  Currently scheduled for Wednesday Multiple myeloma workup: SPEP: No M spike, IFE: Normal.  Slightly elevated free light chains with normal ratio Recent blood work with a hemoglobin of 6.9, received 2 units of packed red blood cells Patient is on Eliquis  for A-fib  -Please follow-up with GI for evaluation of GI bleed -Continue iron  pills every other day - Will hold off on IV iron  infusion considering patient just received 2 units of packed red blood cells  Return to clinic in 1 month with labs to assess for further need of IV iron   CKD (chronic kidney disease) Patient has a history of CKD following with Dr. Rachele.  Was previously on EPO shots for anemia.  -Continue to follow with Dr. Rachele -If anemia does not correct with iron  replacement, will consider EPO shots    Orders Placed This Encounter  Procedures   Ferritin    Standing Status:   Future    Expected Date:   11/01/2023    Expiration Date:   01/30/2024   Folate    Standing Status:   Future    Expected Date:   11/01/2023    Expiration Date:   01/30/2024   Vitamin B12    Standing Status:   Future    Expected Date:   11/01/2023    Expiration Date:   01/30/2024    CBC with Differential/Platelet    Standing Status:   Future    Expected Date:   11/01/2023    Expiration Date:   01/30/2024   Comprehensive metabolic panel with GFR    Standing Status:   Future    Expected Date:   11/01/2023    Expiration Date:   01/30/2024   Iron  and TIBC    Standing Status:   Future    Expected Date:   11/01/2023    Expiration Date:   01/30/2024    The total time spent in the appointment was 20 minutes encounter with patients including review of chart and various tests results, discussions about plan of care and coordination of care plan   All questions were answered. The patient knows to call the clinic with any problems, questions or concerns. No barriers to learning was detected.  Mickiel Dry, MD 7/7/20253:14 PM   SUMMARY OF HEMATOLOGIC HISTORY: - Iron  deficiency anemia likely secondary to GI bleeding - S/p IV Monoferric  1000 mg on 02/23/2023, 05/06/2023, 07/30/2023 and 08/31/2023. - SPEP: No M spike, IFE: Normal, slight elevated free light chains with normal FLC ratio.   INTERVAL HISTORY: [place] COHICK 68 y.o. female following for iron  deficiency anemia.  Patient is accompanied by her legal guardian today.  She reported that she was feeling fatigued until she got blood transfusion since then has been feeling better.  She  has no complaints today.  Denies hematochezia or melena.  She has an appointment with GI scheduled on Wednesday to do a colonoscopy and endoscopy.    I have reviewed the past medical history, past surgical history, social history and family history with the patient   ALLERGIES:  has no known allergies.  MEDICATIONS:  Current Outpatient Medications  Medication Sig Dispense Refill   acetaminophen  (TYLENOL ) 325 MG tablet Take 325 mg by mouth every 12 (twelve) hours as needed for moderate pain (pain score 4-6).     albuterol  (PROVENTIL  HFA;VENTOLIN  HFA) 108 (90 Base) MCG/ACT inhaler Inhale 2 puffs into the lungs every 4 (four) hours as needed for  shortness of breath.     apixaban  (ELIQUIS ) 5 MG TABS tablet Take 1 tablet (5 mg total) by mouth 2 (two) times daily. 60 tablet 3   benzonatate  (TESSALON ) 100 MG capsule Take 100 mg by mouth 3 (three) times daily.     bisacodyl  5 MG EC tablet As directed for procedure instructions 8 tablet 0   calcitRIOL  (ROCALTROL ) 0.25 MCG capsule Take 0.25 mcg by mouth every Monday, Wednesday, and Friday.     cetirizine  (ZYRTEC ) 10 MG tablet Take 10 mg by mouth daily.     Cholecalciferol  (VITAMIN D3) 50 MCG (2000 UT) TABS TAKE 1 TABLET BY MOUTH ONCE DAILY. 30 tablet 2   cyanocobalamin  (VITAMIN B12) 1000 MCG tablet Take 1 tablet (1,000 mcg total) by mouth daily. 90 tablet 2   diltiazem  (CARDIZEM  CD) 240 MG 24 hr capsule Take 1 capsule (240 mg total) by mouth daily. 30 capsule 0   DROPSAFE SAFETY PEN NEEDLES 31G X 6 MM MISC      ferrous sulfate  325 (65 FE) MG tablet Take 1 tablet (325 mg total) by mouth every other day. 90 tablet 3   Finerenone  (KERENDIA ) 10 MG TABS Take 10 mg by mouth daily.     fluticasone  (FLONASE ) 50 MCG/ACT nasal spray Place 2 sprays into both nostrils daily as needed for allergies.     folic acid  (FOLVITE ) 1 MG tablet Take 1 tablet (1 mg total) by mouth daily. 60 tablet 2   furosemide  (LASIX ) 20 MG tablet TAKE 1 TABLET BY MOUTH EVERY OTHER DAY,ALTERNATE WITH 40 MG TABLET. 45 tablet 3   furosemide  (LASIX ) 40 MG tablet TAKE 1 TABLET BY MOUTH EVERY OTHER DAY,ALTERNATE WITH 20 MG TABLET. 90 tablet 0   GERI-TUSSIN DM 10-100 MG/5ML liquid Take by mouth.     glucose blood (EASYMAX TEST) test strip CHECK BLOOD SUGAR 4 TIMES A DAY. (BREAKFAST, LUNCH, SUPPER & BEDTIME) 200 strip 2   hydrOXYzine (ATARAX) 10 MG tablet Take 10 mg by mouth 3 (three) times daily.     insulin  regular human CONCENTRATED (HUMULIN  R U-500 KWIKPEN) 500 UNIT/ML KwikPen INJECT 50 UNITS SUBCUTANEOUSLY AT BREAKFAST, 20 UNITS AT LUNCH & 40 UNITS AT SUPPER; WHEN GLUCOSE IS ABOVE 90 & EATING.(HOLD IF BS BELOW 70: CALL MD IF BS ABOVE  400) 12 mL 0   ipratropium-albuterol  (DUONEB) 0.5-2.5 (3) MG/3ML SOLN Take 3 mLs by nebulization every 6 (six) hours as needed (for cough).     isosorbide  dinitrate (ISORDIL ) 10 MG tablet Take 1 tablet (10 mg total) by mouth 3 (three) times daily. 90 tablet 0   Lancets 30G MISC Use to check blood glucose four times daily as instructed. 100 each 2   loperamide (IMODIUM) 2 MG capsule Take 2-4 mg by mouth See admin instructions. Take 4 mg by mouth now: then 2 mg  after each loose stool as needed, do not exceed 5 doses in 24 hours.     losartan  (COZAAR ) 25 MG tablet Take 12.5 mg by mouth daily.     metolazone (ZAROXOLYN) 5 MG tablet Take 5 mg by mouth in the morning.     metoprolol  tartrate (LOPRESSOR ) 50 MG tablet Take 50 mg by mouth 2 (two) times daily.     montelukast  (SINGULAIR ) 10 MG tablet Take 10 mg by mouth at bedtime.     pantoprazole  (PROTONIX ) 40 MG tablet Take 1 tablet (40 mg total) by mouth 2 (two) times daily before a meal. 60 tablet 5   polyethylene glycol powder (GLYCOLAX /MIRALAX ) 17 GM/SCOOP powder Take 17 g by mouth daily as needed for mild constipation or moderate constipation.     potassium chloride  SA (KLOR-CON  M) 20 MEQ tablet Take 20 mEq by mouth daily.     rosuvastatin  (CRESTOR ) 10 MG tablet Take 10 mg by mouth at bedtime.     simethicone  (SIMETHICONE  DROPS INFANTS) 40 MG/0.6ML drops As directed for colonoscopy instructions 30 mL 0   sodium phosphate  (FLEET) ENEM Place 133 mLs (1 enema total) rectally as directed. 133 mL 0   TRELEGY ELLIPTA 100-62.5-25 MCG/ACT AEPB Inhale 1 puff into the lungs daily.     triamcinolone cream (KENALOG) 0.5 % Apply 1 application  topically every 12 (twelve) hours as needed (applied to bilateral legs for allergic dermatitis).     No current facility-administered medications for this visit.     REVIEW OF SYSTEMS:   Constitutional: Denies fevers, chills or night sweats Eyes: Denies blurriness of vision Ears, nose, mouth, throat, and face: Denies  mucositis or sore throat Respiratory: Denies cough, dyspnea or wheezes Cardiovascular: Denies palpitation, chest discomfort or lower extremity swelling Gastrointestinal:  Denies nausea, heartburn or change in bowel habits Skin: Denies abnormal skin rashes Lymphatics: Denies new lymphadenopathy or easy bruising Neurological:Denies numbness, tingling or new weaknesses Behavioral/Psych: Mood is stable, no new changes  All other systems were reviewed with the patient and are negative.  PHYSICAL EXAMINATION:   Vitals:   10/04/23 0901  BP: (!) 140/80  Pulse: 74  Resp: 19  Temp: 98.3 F (36.8 C)  SpO2: 98%    GENERAL:alert, no distress and comfortable LUNGS: clear to auscultation and percussion with normal breathing effort HEART: regular rate & rhythm and no murmurs and no lower extremity edema ABDOMEN:abdomen soft, non-tender and normal bowel sounds Musculoskeletal:no cyanosis of digits and no clubbing  NEURO: alert & oriented x 3 with fluent speech  LABORATORY DATA:  I have reviewed the data as listed  Lab Results  Component Value Date   WBC 6.9 09/30/2023   NEUTROABS 5.5 09/30/2023   HGB 6.9 (LL) 09/30/2023   HCT 22.5 (L) 09/30/2023   MCV 99.6 09/30/2023   PLT 303 09/30/2023       Chemistry      Component Value Date/Time   NA 133 (L) 09/30/2023 0851   NA 136 10/06/2022 0802   K 2.8 (L) 09/30/2023 0851   CL 93 (L) 09/30/2023 0851   CO2 26 09/30/2023 0851   BUN 54 (H) 09/30/2023 0851   BUN 53 (H) 10/06/2022 0802   CREATININE 2.00 (H) 09/30/2023 0851   CREATININE 2.39 (H) 01/27/2023 1043   CREATININE 1.57 (H) 09/12/2019 0831   GLU 169 05/07/2020 0000      Component Value Date/Time   CALCIUM  9.0 09/30/2023 0851   CALCIUM  7.8 (L) 05/31/2018 0609   ALKPHOS 67 09/30/2023 0851  AST 18 09/30/2023 0851   AST 9 (L) 01/27/2023 1043   ALT 12 09/30/2023 0851   ALT 5 01/27/2023 1043   BILITOT 0.4 09/30/2023 0851   BILITOT 0.4 01/27/2023 1043      Latest Reference  Range & Units 09/30/23 08:51  Iron  28 - 170 ug/dL 51  UIBC ug/dL 759  TIBC 749 - 549 ug/dL 708  Saturation Ratios 10.4 - 31.8 % 18  Ferritin 11 - 307 ng/mL 239  Folate >5.9 ng/mL >40.0  Vitamin B12 180 - 914 pg/mL 1,024 (H)  (H): Data is abnormally high   Latest Reference Range & Units 06/28/23 13:15  Total Protein ELP 6.0 - 8.5 g/dL 6.4 (C)  Albumin SerPl Elph-Mcnc 2.9 - 4.4 g/dL 3.0 (C)  Albumin/Glob SerPl 0.7 - 1.7  0.9 (C)  Alpha2 Glob SerPl Elph-Mcnc 0.4 - 1.0 g/dL 1.0 (C)  Alpha 1 0.0 - 0.4 g/dL 0.4 (C)  Gamma Glob SerPl Elph-Mcnc 0.4 - 1.8 g/dL 1.2 (C)  M Protein SerPl Elph-Mcnc Not Observed g/dL Not Observed (C)  IFE 1  Comment (C)  Globulin, Total 2.2 - 3.9 g/dL 3.4 (C)  B-Globulin SerPl Elph-Mcnc 0.7 - 1.3 g/dL 0.9 (C)  IgG (Immunoglobin G), Serum 586 - 1,602 mg/dL 8,796  IgM (Immunoglobulin M), Srm 26 - 217 mg/dL 38  IgA 87 - 647 mg/dL 707  (C): Corrected IFE 1 Comment VC       Comment: (NOTE) The immunofixation pattern appears unremarkable. Evidence of monoclonal protein is not apparent.    Latest Reference Range & Units 06/28/23 13:15  Kappa free light chain 3.3 - 19.4 mg/L 80.4 (H)  Lambda free light chains 5.7 - 26.3 mg/L 53.0 (H)  Kappa, lambda light chain ratio 0.26 - 1.65  1.52  (H): Data is abnormally high

## 2023-10-04 NOTE — Patient Instructions (Signed)
 [name]  10/04/2023     @PREFPERIOPPHARMACY @   Your procedure is scheduled on  10/06/2023.   Report to [name] at  1100  A.M.   Call this number if you have problems the morning of surgery:  [phone]  If you experience any cold or flu symptoms such as cough, fever, chills, shortness of breath, etc. between now and your scheduled surgery, please notify us  at the above number.   Remember:        Your last dose of eliquis  should be on 10/03/2023.        Use your nebulizer and your inhaler before you come and bring your rescue inhaler with you.       Take 1/2 of your usual night time insulin  the night before your procedure.         DO NOT take any medications for diabets the morning of yor procedure.    Follow the diet and prep instructions given to you by the office.    You may drink clear liquids until  0900 am on 10/06/2023.    Clear liquids allowed are:                    Water , Juice (No red color; non-citric and without pulp; diabetics please choose diet or no sugar options), Carbonated beverages (diabetics please choose diet or no sugar options), Clear Tea (No creamer, milk, or cream, including half & half and powdered creamer), Black Coffee Only (No creamer, milk or cream, including half & half and powdered creamer), and Clear Sports drink (No red color; diabetics please choose diet or no sugar options)    Take these medicines the morning of surgery with A SIP OF WATER         diltiazem , finerenone , hydroxyzine, isosorbide , metoprolol , pantoprazole .    Do not wear jewelry, make-up or nail polish, including gel polish,  artificial nails, or any other type of covering on natural nails (fingers and  toes).  Do not wear lotions, powders, or perfumes, or deodorant.  Do not shave 48 hours prior to surgery.  Men may shave face and neck.  Do not bring valuables to the hospital.  [facility] is not responsible for any belongings or  valuables.  Contacts, dentures or bridgework may not be worn into surgery.  Leave your suitcase in the car.  After surgery it may be brought to your room.  For patients admitted to the hospital, discharge time will be determined by your treatment team.  Patients discharged the day of surgery will not be allowed to drive home and must have someone with them for 24 hours.    Special instructions:   DO NOT smoke tobacco or vape for 24 hours before your procedure.  Please read over the following fact sheets that you were given. Anesthesia Post-op Instructions and Care and Recovery After Surgery      Upper Endoscopy, Adult, Care After After the procedure, it is common to have a sore throat. It is also common to have: Mild stomach pain or discomfort. Bloating. Nausea. Follow these instructions at home: The instructions below may help you care for yourself at home. Your health care provider may give you more instructions. If you have questions, ask your health care provider. If you were given a sedative during the procedure, it can affect you for several hours. Do not drive or operate machinery until your health care provider says that it  is safe. If you will be going home right after the procedure, plan to have a responsible adult: Take you home from the hospital or clinic. You will not be allowed to drive. Care for you for the time you are told. Follow instructions from your health care provider about what you may eat and drink. Return to your normal activities as told by your health care provider. Ask your health care provider what activities are safe for you. Take over-the-counter and prescription medicines only as told by your health care provider. Contact a health care provider if you: Have a sore throat that lasts longer than one day. Have trouble swallowing. Have a fever. Get help right away if you: Vomit blood or your vomit looks like coffee grounds. Have bloody, black, or tarry  stools. Have a very bad sore throat or you cannot swallow. Have difficulty breathing or very bad pain in your chest or abdomen. These symptoms may be an emergency. Get help right away. Call 911. Do not wait to see if the symptoms will go away. Do not drive yourself to the hospital. Summary After the procedure, it is common to have a sore throat, mild stomach discomfort, bloating, and nausea. If you were given a sedative during the procedure, it can affect you for several hours. Do not drive until your health care provider says that it is safe. Follow instructions from your health care provider about what you may eat and drink. Return to your normal activities as told by your health care provider. This information is not intended to replace advice given to you by your health care provider. Make sure you discuss any questions you have with your health care provider. Document Revised: 06/25/2021 Document Reviewed: 06/25/2021 Elsevier Patient Education  2024 [employer]Colonoscopy, Adult, Care After The following information offers guidance on how to care for yourself after your procedure. Your health care provider may also give you more specific instructions. If you have problems or questions, contact your health care provider. What can I expect after the procedure? After the procedure, it is common to have: A small amount of blood in your stool for 24 hours after the procedure. Some gas. Mild cramping or bloating of your abdomen. Follow these instructions at home: Eating and drinking  Drink enough fluid to keep your urine pale yellow. Follow instructions from your health care provider about eating or drinking restrictions. Resume your normal diet as told by your health care provider. Avoid heavy or fried foods that are hard to digest. Activity Rest as told by your health care provider. Avoid sitting for a long time without moving. Get up to take short walks every 1-2 hours. This is  important to improve blood flow and breathing. Ask for help if you feel weak or unsteady. Return to your normal activities as told by your health care provider. Ask your health care provider what activities are safe for you. Managing cramping and bloating  Try walking around when you have cramps or feel bloated. If directed, apply heat to your abdomen as told by your health care provider. Use the heat source that your health care provider recommends, such as a moist heat pack or a heating pad. Place a towel between your skin and the heat source. Leave the heat on for 20-30 minutes. Remove the heat if your skin turns bright red. This is especially important if you are unable to feel pain, heat, or cold. You have a greater risk of getting burned. General instructions If  you were given a sedative during the procedure, it can affect you for several hours. Do not drive or operate machinery until your health care provider says that it is safe. For the first 24 hours after the procedure: Do not sign important documents. Do not drink alcohol . Do your regular daily activities at a slower pace than normal. Eat soft foods that are easy to digest. Take over-the-counter and prescription medicines only as told by your health care provider. Keep all follow-up visits. This is important. Contact a health care provider if: You have blood in your stool 2-3 days after the procedure. Get help right away if: You have more than a small spotting of blood in your stool. You have large blood clots in your stool. You have swelling of your abdomen. You have nausea or vomiting. You have a fever. You have increasing pain in your abdomen that is not relieved with medicine. These symptoms may be an emergency. Get help right away. Call 911. Do not wait to see if the symptoms will go away. Do not drive yourself to the hospital. Summary After the procedure, it is common to have a small amount of blood in your stool. You  may also have mild cramping and bloating of your abdomen. If you were given a sedative during the procedure, it can affect you for several hours. Do not drive or operate machinery until your health care provider says that it is safe. Get help right away if you have a lot of blood in your stool, nausea or vomiting, a fever, or increased pain in your abdomen. This information is not intended to replace advice given to you by your health care provider. Make sure you discuss any questions you have with your health care provider. Document Revised: 04/28/2022 Document Reviewed: 11/06/2020 Elsevier Patient Education  2024 [employer].General Anesthesia, Adult, Care After The following information offers guidance on how to care for yourself after your procedure. Your health care provider may also give you more specific instructions. If you have problems or questions, contact your health care provider. What can I expect after the procedure? After the procedure, it is common for people to: Have pain or discomfort at the IV site. Have nausea or vomiting. Have a sore throat or hoarseness. Have trouble concentrating. Feel cold or chills. Feel weak, sleepy, or tired (fatigue). Have soreness and body aches. These can affect parts of the body that were not involved in surgery. Follow these instructions at home: For the time period you were told by your health care provider:  Rest. Do not participate in activities where you could fall or become injured. Do not drive or use machinery. Do not drink alcohol . Do not take sleeping pills or medicines that cause drowsiness. Do not make important decisions or sign legal documents. Do not take care of children on your own. General instructions Drink enough fluid to keep your urine pale yellow. If you have sleep apnea, surgery and certain medicines can increase your risk for breathing problems. Follow instructions from your health care provider about wearing your  sleep device: Anytime you are sleeping, including during daytime naps. While taking prescription pain medicines, sleeping medicines, or medicines that make you drowsy. Return to your normal activities as told by your health care provider. Ask your health care provider what activities are safe for you. Take over-the-counter and prescription medicines only as told by your health care provider. Do not use any products that contain nicotine or tobacco. These products include cigarettes,  chewing tobacco, and vaping devices, such as e-cigarettes. These can delay incision healing after surgery. If you need help quitting, ask your health care provider. Contact a health care provider if: You have nausea or vomiting that does not get better with medicine. You vomit every time you eat or drink. You have pain that does not get better with medicine. You cannot urinate or have bloody urine. You develop a skin rash. You have a fever. Get help right away if: You have trouble breathing. You have chest pain. You vomit blood. These symptoms may be an emergency. Get help right away. Call 911. Do not wait to see if the symptoms will go away. Do not drive yourself to the hospital. Summary After the procedure, it is common to have a sore throat, hoarseness, nausea, vomiting, or to feel weak, sleepy, or fatigue. For the time period you were told by your health care provider, do not drive or use machinery. Get help right away if you have difficulty breathing, have chest pain, or vomit blood. These symptoms may be an emergency. This information is not intended to replace advice given to you by your health care provider. Make sure you discuss any questions you have with your health care provider. Document Revised: 06/13/2021 Document Reviewed: 06/13/2021 Elsevier Patient Education  2024 [employer].

## 2023-10-04 NOTE — Assessment & Plan Note (Addendum)
 Patient has chronic anemia likely secondary to iron  deficiency and chronic kidney disease.   Iron  levels improved with IV iron  with slight improvement in hemoglobin initially.  Currently, dropping iron  levels again. Patient is being worked up by GI and is scheduled for endoscopy and colonoscopy.  Currently scheduled for Wednesday Multiple myeloma workup: SPEP: No M spike, IFE: Normal.  Slightly elevated free light chains with normal ratio Recent blood work with a hemoglobin of 6.9, received 2 units of packed red blood cells Patient is on Eliquis  for A-fib  -Please follow-up with GI for evaluation of GI bleed -Continue iron  pills every other day - Will hold off on IV iron  infusion considering patient just received 2 units of packed red blood cells  Return to clinic in 1 month with labs to assess for further need of IV iron 

## 2023-10-04 NOTE — Assessment & Plan Note (Signed)
 Patient has a history of CKD following with Dr. Wolfgang Phoenix.  Was previously on EPO shots for anemia. -Continue to follow with Dr. Wolfgang Phoenix -If anemia does not correct with iron replacement, will consider EPO shots

## 2023-10-05 ENCOUNTER — Encounter (HOSPITAL_COMMUNITY)
Admission: RE | Admit: 2023-10-05 | Discharge: 2023-10-05 | Disposition: A | Discharge status: Home / Self Care | Source: Ambulatory Visit | Attending: Internal Medicine | Admitting: Internal Medicine

## 2023-10-05 ENCOUNTER — Encounter (HOSPITAL_COMMUNITY): Payer: Self-pay

## 2023-10-05 ENCOUNTER — Telehealth: Payer: Self-pay

## 2023-10-05 VITALS — BP 128/69 | HR 74 | Resp 18 | Ht 62.0 in | Wt 243.5 lb

## 2023-10-05 DIAGNOSIS — N184 Chronic kidney disease, stage 4 (severe): Secondary | ICD-10-CM | POA: Diagnosis not present

## 2023-10-05 DIAGNOSIS — Z01812 Encounter for preprocedural laboratory examination: Secondary | ICD-10-CM | POA: Diagnosis not present

## 2023-10-05 DIAGNOSIS — E1122 Type 2 diabetes mellitus with diabetic chronic kidney disease: Secondary | ICD-10-CM | POA: Diagnosis not present

## 2023-10-05 DIAGNOSIS — Z794 Long term (current) use of insulin: Secondary | ICD-10-CM | POA: Diagnosis not present

## 2023-10-05 DIAGNOSIS — D649 Anemia, unspecified: Secondary | ICD-10-CM | POA: Insufficient documentation

## 2023-10-05 LAB — CBC WITH DIFFERENTIAL/PLATELET
Abs Immature Granulocytes: 0.05 K/uL (ref 0.00–0.07)
Basophils Absolute: 0 K/uL (ref 0.0–0.1)
Basophils Relative: 1 %
Eosinophils Absolute: 0.3 K/uL (ref 0.0–0.5)
Eosinophils Relative: 5 %
HCT: 30.5 % — ABNORMAL LOW (ref 36.0–46.0)
Hemoglobin: 9.4 g/dL — ABNORMAL LOW (ref 12.0–15.0)
Immature Granulocytes: 1 %
Lymphocytes Relative: 11 %
Lymphs Abs: 0.7 K/uL (ref 0.7–4.0)
MCH: 30.5 pg (ref 26.0–34.0)
MCHC: 30.8 g/dL (ref 30.0–36.0)
MCV: 99 fL (ref 80.0–100.0)
Monocytes Absolute: 0.5 K/uL (ref 0.1–1.0)
Monocytes Relative: 8 %
Neutro Abs: 4.7 K/uL (ref 1.7–7.7)
Neutrophils Relative %: 74 %
Platelets: 231 K/uL (ref 150–400)
RBC: 3.08 MIL/uL — ABNORMAL LOW (ref 3.87–5.11)
RDW: 16.1 % — ABNORMAL HIGH (ref 11.5–15.5)
WBC: 6.3 K/uL (ref 4.0–10.5)
nRBC: 0 % (ref 0.0–0.2)

## 2023-10-05 LAB — BASIC METABOLIC PANEL WITH GFR
Anion gap: 11 (ref 5–15)
BUN: 41 mg/dL — ABNORMAL HIGH (ref 8–23)
CO2: 27 mmol/L (ref 22–32)
Calcium: 9 mg/dL (ref 8.9–10.3)
Chloride: 98 mmol/L (ref 98–111)
Creatinine, Ser: 1.71 mg/dL — ABNORMAL HIGH (ref 0.44–1.00)
GFR, Estimated: 32 mL/min — ABNORMAL LOW (ref >60)
Glucose, Bld: 278 mg/dL — ABNORMAL HIGH (ref 70–99)
Potassium: 2.8 mmol/L — ABNORMAL LOW (ref 3.5–5.1)
Sodium: 136 mmol/L (ref 135–145)

## 2023-10-05 NOTE — Telephone Encounter (Signed)
-----   Message from Lamar Hollingshead sent at 10/05/2023  2:41 PM EDT ----- Potassium 2.8.  She is supposed to be on potassium already.  Need to check about that she is got chronic kidney disease and on furosemide .  Can we confirm.  I need to know about these things.  I need to give her some potassium between now and tomorrow.

## 2023-10-05 NOTE — Telephone Encounter (Signed)
 Spoke with [name] at [facility] and instructed her to have pt increase her potassium by 20 meq tonight and have pt take her normal 20 meq in the morning. Faxed over an rx for this to the facility as well. [name] at [facility] verbalized understanding.

## 2023-10-06 ENCOUNTER — Encounter (HOSPITAL_COMMUNITY): Payer: Self-pay | Admitting: Certified Registered Nurse Anesthetist

## 2023-10-06 ENCOUNTER — Encounter (HOSPITAL_COMMUNITY)
Admission: RE | Disposition: A | Discharge status: Skilled Nursing Facility | Payer: Self-pay | Source: Home / Self Care | Attending: Internal Medicine

## 2023-10-06 ENCOUNTER — Ambulatory Visit (HOSPITAL_COMMUNITY)
Admission: RE | Admit: 2023-10-06 | Discharge: 2023-10-06 | Disposition: A | Discharge status: Skilled Nursing Facility | Attending: Internal Medicine | Admitting: Internal Medicine

## 2023-10-06 ENCOUNTER — Ambulatory Visit (HOSPITAL_COMMUNITY): Payer: Self-pay | Admitting: Certified Registered Nurse Anesthetist

## 2023-10-06 DIAGNOSIS — E1122 Type 2 diabetes mellitus with diabetic chronic kidney disease: Secondary | ICD-10-CM | POA: Diagnosis not present

## 2023-10-06 DIAGNOSIS — N183 Chronic kidney disease, stage 3 unspecified: Secondary | ICD-10-CM | POA: Diagnosis not present

## 2023-10-06 DIAGNOSIS — J4489 Other specified chronic obstructive pulmonary disease: Secondary | ICD-10-CM | POA: Diagnosis not present

## 2023-10-06 DIAGNOSIS — K766 Portal hypertension: Secondary | ICD-10-CM | POA: Diagnosis not present

## 2023-10-06 DIAGNOSIS — Z833 Family history of diabetes mellitus: Secondary | ICD-10-CM | POA: Insufficient documentation

## 2023-10-06 DIAGNOSIS — K219 Gastro-esophageal reflux disease without esophagitis: Secondary | ICD-10-CM | POA: Insufficient documentation

## 2023-10-06 DIAGNOSIS — K3189 Other diseases of stomach and duodenum: Secondary | ICD-10-CM | POA: Insufficient documentation

## 2023-10-06 DIAGNOSIS — Z1211 Encounter for screening for malignant neoplasm of colon: Secondary | ICD-10-CM | POA: Diagnosis not present

## 2023-10-06 DIAGNOSIS — G4733 Obstructive sleep apnea (adult) (pediatric): Secondary | ICD-10-CM | POA: Diagnosis not present

## 2023-10-06 DIAGNOSIS — I4891 Unspecified atrial fibrillation: Secondary | ICD-10-CM | POA: Insufficient documentation

## 2023-10-06 DIAGNOSIS — K635 Polyp of colon: Secondary | ICD-10-CM

## 2023-10-06 DIAGNOSIS — I2510 Atherosclerotic heart disease of native coronary artery without angina pectoris: Secondary | ICD-10-CM | POA: Insufficient documentation

## 2023-10-06 DIAGNOSIS — I130 Hypertensive heart and chronic kidney disease with heart failure and stage 1 through stage 4 chronic kidney disease, or unspecified chronic kidney disease: Secondary | ICD-10-CM | POA: Insufficient documentation

## 2023-10-06 DIAGNOSIS — Z860101 Personal history of adenomatous and serrated colon polyps: Secondary | ICD-10-CM | POA: Insufficient documentation

## 2023-10-06 DIAGNOSIS — K31811 Angiodysplasia of stomach and duodenum with bleeding: Secondary | ICD-10-CM

## 2023-10-06 DIAGNOSIS — I110 Hypertensive heart disease with heart failure: Secondary | ICD-10-CM

## 2023-10-06 DIAGNOSIS — K31819 Angiodysplasia of stomach and duodenum without bleeding: Secondary | ICD-10-CM | POA: Diagnosis not present

## 2023-10-06 DIAGNOSIS — R011 Cardiac murmur, unspecified: Secondary | ICD-10-CM | POA: Insufficient documentation

## 2023-10-06 DIAGNOSIS — Q438 Other specified congenital malformations of intestine: Secondary | ICD-10-CM

## 2023-10-06 DIAGNOSIS — D120 Benign neoplasm of cecum: Secondary | ICD-10-CM | POA: Diagnosis not present

## 2023-10-06 DIAGNOSIS — K6389 Other specified diseases of intestine: Secondary | ICD-10-CM | POA: Insufficient documentation

## 2023-10-06 DIAGNOSIS — D500 Iron deficiency anemia secondary to blood loss (chronic): Secondary | ICD-10-CM | POA: Diagnosis not present

## 2023-10-06 DIAGNOSIS — I5032 Chronic diastolic (congestive) heart failure: Secondary | ICD-10-CM

## 2023-10-06 DIAGNOSIS — Z8249 Family history of ischemic heart disease and other diseases of the circulatory system: Secondary | ICD-10-CM | POA: Diagnosis not present

## 2023-10-06 DIAGNOSIS — Z87891 Personal history of nicotine dependence: Secondary | ICD-10-CM | POA: Diagnosis not present

## 2023-10-06 HISTORY — PX: COLONOSCOPY: SHX5424

## 2023-10-06 HISTORY — PX: ESOPHAGOGASTRODUODENOSCOPY: SHX5428

## 2023-10-06 LAB — GLUCOSE, CAPILLARY: Glucose-Capillary: 189 mg/dL — ABNORMAL HIGH (ref 70–99)

## 2023-10-06 SURGERY — COLONOSCOPY
Anesthesia: General

## 2023-10-06 MED ORDER — PROPOFOL 500 MG/50ML IV EMUL
INTRAVENOUS | Status: DC | PRN
Start: 2023-10-06 — End: 2023-10-06
  Administered 2023-10-06: 150 ug/kg/min via INTRAVENOUS

## 2023-10-06 MED ORDER — LIDOCAINE 2% (20 MG/ML) 5 ML SYRINGE
INTRAMUSCULAR | Status: DC | PRN
  Administered 2023-10-06: 100 mg via INTRAVENOUS

## 2023-10-06 MED ORDER — PROPOFOL 10 MG/ML IV BOLUS
INTRAVENOUS | Status: DC | PRN
  Administered 2023-10-06 (×4): 25 mg via INTRAVENOUS

## 2023-10-06 MED ORDER — LACTATED RINGERS IV SOLN: INTRAVENOUS | Status: DC | PRN

## 2023-10-06 NOTE — H&P (Signed)
 @LOGO @   Primary Care Physician:  [name], Tesfaye Demissie, MD Primary Gastroenterologist:  Dr. Shaaron  Pre-Procedure History & Physical: HPI:  [name] is a 68 y.o. female here for a diagnostic EGD and surveillance colonoscopy.  History of ongoing IDA.  History of gastric AVMs ablated previously.  History of colonic adenomas removed previously.  Past Medical History:  Diagnosis Date   Arthritis    Asthma    Atrial fibrillation (HCC)    Carcinoid tumor determined by biopsy of lung 05/2018   Left lung   Chronic diastolic CHF (congestive heart failure) (HCC)    CKD (chronic kidney disease), stage III (HCC)    Diabetes mellitus type 2 in obese    Essential hypertension    GERD (gastroesophageal reflux disease)    Heart murmur    Iron  deficiency anemia 10/16/2010   Mental handicap 10/16/2010   Mild CAD 2013   Morbid obesity (HCC)    OSA (obstructive sleep apnea)     Past Surgical History:  Procedure Laterality Date   ABDOMINAL HYSTERECTOMY     AV FISTULA PLACEMENT Right 06/06/2018   Procedure: ARTERIOVENOUS (AV) FISTULA CREATION;  Surgeon: Gretta Lonni PARAS, MD;  Location: [facility] OR;  Service: Vascular;  Laterality: Right;   BIOPSY  10/16/2021   Procedure: BIOPSY;  Surgeon: Shaaron Lamar HERO, MD;  Location: [facility];  Service: Endoscopy;;   BRONCHIAL DILITATION  12/13/2020   Procedure: BRONCHIAL DILITATION;  Surgeon: Brenna Adine CROME, DO;  Location: [facility];  Service: Pulmonary;;   CESAREAN SECTION     CHOLECYSTECTOMY     COLONOSCOPY  08/2010   normal TI, sigmoid polyp (adenoma ). Next TCS due  08/2015,   COLONOSCOPY WITH PROPOFOL  N/A 07/08/2020   Procedure: COLONOSCOPY WITH PROPOFOL ;  Surgeon: Shaaron Lamar HERO, MD;  Location: [facility];  Service: Endoscopy;  Laterality: N/A;  AM (diabetic and facility patient)   ESOPHAGOGASTRODUODENOSCOPY  08/2010   antral and duodenal erosions s/p bx (chronic gastritis, no h.pylori, no celiac dz ), hiatal hernia    ESOPHAGOGASTRODUODENOSCOPY (EGD) WITH PROPOFOL  N/A 10/16/2021   Procedure: ESOPHAGOGASTRODUODENOSCOPY (EGD) WITH PROPOFOL ;  Surgeon: Shaaron Lamar HERO, MD;  Location: [facility];  Service: Endoscopy;  Laterality: N/A;  11:45am   ESOPHAGOGASTRODUODENOSCOPY (EGD) WITH PROPOFOL  N/A 12/18/2021   Procedure: ESOPHAGOGASTRODUODENOSCOPY (EGD) WITH PROPOFOL ;  Surgeon: Cindie Carlin POUR, DO;  Location: [facility];  Service: Endoscopy;  Laterality: N/A;   ESOPHAGOGASTRODUODENOSCOPY (EGD) WITH PROPOFOL  N/A 01/19/2023   Procedure: ESOPHAGOGASTRODUODENOSCOPY (EGD) WITH PROPOFOL ;  Surgeon: Eartha Angelia Sieving, MD;  Location: [facility];  Service: Gastroenterology;  Laterality: N/A;  2:30 pm, asa 3   FISTULA SUPERFICIALIZATION Right 08/25/2018   Procedure: FISTULA SUPERFICIALIZATION RIGHT ARM;  Surgeon: Sheree Penne Lonni, MD;  Location: [facility] OR;  Service: Cardiovascular;  Laterality: Right;   HOT HEMOSTASIS  12/18/2021   Procedure: HOT HEMOSTASIS (ARGON PLASMA COAGULATION/BICAP);  Surgeon: Cindie Carlin POUR, DO;  Location: [facility];  Service: Endoscopy;;   HOT HEMOSTASIS  01/19/2023   Procedure: HOT HEMOSTASIS (ARGON PLASMA COAGULATION/BICAP);  Surgeon: Eartha Angelia, Sieving, MD;  Location: [facility];  Service: Gastroenterology;;   IR FLUORO GUIDE CV LINE RIGHT  05/31/2018   IR US  GUIDE VASC ACCESS RIGHT  05/31/2018   KNEE SURGERY     right knee @ 68 years of age   LEFT AND RIGHT HEART CATHETERIZATION WITH CORONARY ANGIOGRAM N/A 09/21/2011   Procedure: LEFT AND RIGHT HEART CATHETERIZATION WITH CORONARY ANGIOGRAM;  Surgeon: Salena RAMAN  Claudene, MD;  Location: [facility];  Service: Cardiovascular;  Laterality: N/A;   POLYPECTOMY  07/08/2020   Procedure: POLYPECTOMY INTESTINAL;  Surgeon: Shaaron Lamar HERO, MD;  Location: [facility];  Service: Endoscopy;;   VIDEO BRONCHOSCOPY Left 12/13/2020   Procedure: VIDEO BRONCHOSCOPY WITHOUT FLUORO;  Surgeon: Brenna Adine CROME, DO;  Location: [facility];   Service: Pulmonary;  Laterality: Left;  Cryotherapy    Prior to Admission medications   Medication Sig Start Date End Date Taking? Authorizing Provider  albuterol  (PROVENTIL  HFA;VENTOLIN  HFA) 108 (90 Base) MCG/ACT inhaler Inhale 2 puffs into the lungs every 4 (four) hours as needed for shortness of breath.   Yes [provider]  benzonatate  (TESSALON ) 100 MG capsule Take 100 mg by mouth 3 (three) times daily. 09/22/23  Yes [provider]  bisacodyl  5 MG EC tablet As directed for procedure instructions 08/30/23  Yes Wyland Rastetter, Lamar HERO, MD  calcitRIOL  (ROCALTROL ) 0.25 MCG capsule Take 0.25 mcg by mouth every Monday, Wednesday, and Friday. 08/08/20  Yes [provider]  diltiazem  (CARDIZEM  CD) 240 MG 24 hr capsule Take 1 capsule (240 mg total) by mouth daily. 06/10/18  Yes Rojelio Nest, DO  Finerenone  (KERENDIA ) 10 MG TABS Take 10 mg by mouth daily.   Yes [provider]  fluticasone  (FLONASE ) 50 MCG/ACT nasal spray Place 2 sprays into both nostrils daily as needed for allergies.   Yes [provider]  folic acid  (FOLVITE ) 1 MG tablet Take 1 tablet (1 mg total) by mouth daily. 04/27/23  Yes Pennington, Pleasant M, PA-C  furosemide  (LASIX ) 20 MG tablet TAKE 1 TABLET BY MOUTH EVERY OTHER DAY,ALTERNATE WITH 40 MG TABLET. 08/30/23  Yes Miriam Norris, NP  GERI-TUSSIN DM 10-100 MG/5ML liquid Take by mouth. 12/16/21  Yes [provider]  hydrOXYzine (ATARAX) 10 MG tablet Take 10 mg by mouth 3 (three) times daily. 06/07/23  Yes [provider]  isosorbide  dinitrate (ISORDIL ) 10 MG tablet Take 1 tablet (10 mg total) by mouth 3 (three) times daily. 06/09/18  Yes Rojelio Nest, DO  Lancets 30G MISC Use to check blood glucose four times daily as instructed. 08/09/23  Yes Nida, Gebreselassie W, MD  loperamide (IMODIUM) 2 MG capsule Take 2-4 mg by mouth See admin instructions. Take 4 mg by mouth now: then 2 mg after each loose stool as needed, do not exceed 5  doses in 24 hours.   Yes [provider]  losartan  (COZAAR ) 25 MG tablet Take 12.5 mg by mouth daily.   Yes [provider]  metolazone (ZAROXOLYN) 5 MG tablet Take 5 mg by mouth in the morning.   Yes [provider]  metoprolol  tartrate (LOPRESSOR ) 50 MG tablet Take 50 mg by mouth 2 (two) times daily. 05/08/20  Yes [provider]  montelukast  (SINGULAIR ) 10 MG tablet Take 10 mg by mouth at bedtime.   Yes [provider]  pantoprazole  (PROTONIX ) 40 MG tablet Take 1 tablet (40 mg total) by mouth 2 (two) times daily before a meal. 07/27/23  Yes Ezzard Sonny RAMAN, PA-C  polyethylene glycol powder (GLYCOLAX /MIRALAX ) 17 GM/SCOOP powder Take 17 g by mouth daily as needed for mild constipation or moderate constipation.   Yes [provider]  potassium chloride  SA (KLOR-CON  M) 20 MEQ tablet Take 20 mEq by mouth daily. 09/11/19  Yes [provider]  rosuvastatin  (CRESTOR ) 10 MG tablet Take 10 mg by mouth at bedtime. 09/29/23  Yes [provider]  simethicone  (SIMETHICONE  DROPS INFANTS) 40  MG/0.6ML drops As directed for colonoscopy instructions 08/30/23  Yes Viviana Trimble, Lamar HERO, MD  sodium phosphate  (FLEET) ENEM Place 133 mLs (1 enema total) rectally as directed. 08/30/23  Yes Ladan Vanderzanden, Lamar HERO, MD  acetaminophen  (TYLENOL ) 325 MG tablet Take 325 mg by mouth every 12 (twelve) hours as needed for moderate pain (pain score 4-6).    [provider]  apixaban  (ELIQUIS ) 5 MG TABS tablet Take 1 tablet (5 mg total) by mouth 2 (two) times daily. 12/20/21   Pearlean Manus, MD  cetirizine  (ZYRTEC ) 10 MG tablet Take 10 mg by mouth daily. 06/06/19   [provider]  Cholecalciferol  (VITAMIN D3) 50 MCG (2000 UT) TABS TAKE 1 TABLET BY MOUTH ONCE DAILY. 01/02/21   Nida, Gebreselassie W, MD  cyanocobalamin  (VITAMIN B12) 1000 MCG tablet Take 1 tablet (1,000 mcg total) by mouth daily. 07/26/23   Davonna Siad, MD  DROPSAFE SAFETY PEN NEEDLES 31G X 6 MM MISC   06/17/23   [provider]  ferrous sulfate  325 (65 FE) MG tablet Take 1 tablet (325 mg total) by mouth every other day. 02/10/23   Kandala, Hyndavi, MD  furosemide  (LASIX ) 40 MG tablet TAKE 1 TABLET BY MOUTH EVERY OTHER DAY,ALTERNATE WITH 20 MG TABLET. 07/30/23   Debera Jayson MATSU, MD  glucose blood (EASYMAX TEST) test strip CHECK BLOOD SUGAR 4 TIMES A DAY. (BREAKFAST, LUNCH, SUPPER & BEDTIME) 04/20/23   Nida, Ethelle ORN, MD  insulin  regular human CONCENTRATED (HUMULIN  R U-500 KWIKPEN) 500 UNIT/ML KwikPen INJECT 50 UNITS SUBCUTANEOUSLY AT BREAKFAST, 20 UNITS AT LUNCH & 40 UNITS AT SUPPER; WHEN GLUCOSE IS ABOVE 90 & EATING.(HOLD IF BS BELOW 70: CALL MD IF BS ABOVE 400) 08/24/23   Nida, Ethelle ORN, MD  ipratropium-albuterol  (DUONEB) 0.5-2.5 (3) MG/3ML SOLN Take 3 mLs by nebulization every 6 (six) hours as needed (for cough).    [provider]  TRELEGY ELLIPTA 100-62.5-25 MCG/ACT AEPB Inhale 1 puff into the lungs daily. 12/14/22   [provider]  triamcinolone cream (KENALOG) 0.5 % Apply 1 application  topically every 12 (twelve) hours as needed (applied to bilateral legs for allergic dermatitis).    [provider]    Allergies as of 08/30/2023   (No Known Allergies)    Family History  Problem Relation Age of Onset   Diabetes Mother    Hypertension Mother    Heart failure Mother    Hypertension Father    Atrial fibrillation Father    Hypertension Son    Kidney disease Other        son reportedly has had cyst on kidney and lung s/p surgery at [facility]   Colon cancer Neg Hx     Social History   Socioeconomic History   Marital status: Divorced    Spouse name: Not on file   Number of children: 1   Years of education: Not on file   Highest education level: Not on file  Occupational History    Employer: UNEMPLOYED  Tobacco Use   Smoking status: Former    Current packs/day: 0.00    Types: Cigarettes    Start date: 04/11/1996    Quit date:  04/11/1996    Years since quitting: 27.5   Smokeless tobacco: Never   Tobacco comments:    quit a couple year ago  Vaping Use   Vaping status: Never Used  Substance and Sexual Activity   Alcohol  use: No   Drug use: No   Sexual activity: Never  Other Topics Concern  Not on file  Social History Narrative   Lives with parents.    Social Drivers of Corporate investment banker Strain: Not on file  Food Insecurity: No Food Insecurity (12/17/2021)   Hunger Vital Sign    Worried About Running Out of Food in the Last Year: Never true    Ran Out of Food in the Last Year: Never true  Transportation Needs: No Transportation Needs (12/17/2021)   PRAPARE - Administrator, Civil Service (Medical): No    Lack of Transportation (Non-Medical): No  Physical Activity: Not on file  Stress: Not on file  Social Connections: Not on file  Intimate Partner Violence: Not At Risk (12/17/2021)   Humiliation, Afraid, Rape, and Kick questionnaire    Fear of Current or Ex-Partner: No    Emotionally Abused: No    Physically Abused: No    Sexually Abused: No    Review of Systems: See HPI, otherwise negative ROS  Physical Exam: Pulse (P) 86   Temp (P) 97.9 F (36.6 C) (Oral)   Resp (P) 16   SpO2 (P) 96%  General:   Alert,  Well-developed, well-nourished, pleasant and cooperative in NAD Neck:  Supple; no masses or thyromegaly. No significant cervical adenopathy. Lungs:  Clear throughout to auscultation.   No wheezes, crackles, or rhonchi. No acute distress. Heart:  Regular rate and rhythm; no murmurs, clicks, rubs,  or gallops. Abdomen: Non-distended, normal bowel sounds.  Soft and nontender without appreciable mass or hepatosplenomegaly.  Pulses:  Normal pulses noted. Extremities:  Without clubbing or edema.  Impression/Plan: 68 year old lady with multiple comorbidities here for further evaluation of iron  deficiency anemia via EGD and colonoscopy.  History of gastric AVMs and colonic  adenomas  Eliquis  held x 2 days..  The risks, benefits, limitations, imponderables and alternatives regarding both EGD and colonoscopy have been reviewed with the patient. Questions have been answered. All parties agreeable.       Notice: This dictation was prepared with Dragon dictation along with smaller phrase technology. Any transcriptional errors that result from this process are unintentional and may not be corrected upon review.

## 2023-10-06 NOTE — Op Note (Signed)
 [facility] Patient Name: [name] Procedure Date: 10/06/2023 12:28 PM MRN: [id number] Date of Birth: 04/24/1955 Attending MD: Lamar Ozell Hollingshead , MD, [id number] CSN: [id number] Age: 68 Admit Type: Outpatient Procedure:                Upper GI endoscopy Indications:              Iron  deficiency anemia secondary to chronic blood                            loss Providers:                Lamar Ozell Hollingshead, MD, Crystal Page, Bascom Blush Referring MD:              Medicines:                Propofol  per Anesthesia Complications:            No immediate complications. Estimated Blood Loss:     Estimated blood loss was minimal. Procedure:                Pre-Anesthesia Assessment:                           - Prior to the procedure, a History and Physical                            was performed, and patient medications and                            allergies were reviewed. The patient's tolerance of                            previous anesthesia was also reviewed. The risks                            and benefits of the procedure and the sedation                            options and risks were discussed with the patient.                            All questions were answered, and informed consent                            was obtained. Prior Anticoagulants: The patient                            last took Eliquis  (apixaban ) 3 days prior to the                            procedure. ASA Grade Assessment: III - A patient                            with severe systemic disease. After reviewing the  risks and benefits, the patient was deemed in                            satisfactory condition to undergo the procedure.                           After obtaining informed consent, the endoscope was                            passed under direct vision. Throughout the                            procedure, the patient's blood pressure, pulse, and                             oxygen  saturations were monitored continuously. The                            GIF-H190 ([id number]) scope was introduced through the                            mouth, and advanced to the second part of duodenum.                            The upper GI endoscopy was accomplished without                            difficulty. The patient tolerated the procedure                            well. Scope In: 1:20:59 PM Scope Out: 1:27:09 PM Total Procedure Duration: 0 hours 6 minutes 10 seconds  Findings:      The examined esophagus was normal.      Mild portal hypertensive gastropathy was found in the entire examined       stomach.      Gastric antral vascular ectasia present. 1 ectatic area in the antrum       that was actively oozing. 3 others appeared quite friable.      The duodenal bulb and second portion of the duodenum were normal.      Utilizing APC with circular probe the ectatic lesions or sealed.       Multiple applications of 20 J each. Good hemostasis obtained. Impression:               - Normal esophagus.                           - Portal hypertensive gastropathy.                           - GAVE with active oozing. Status post [facility]                            treatment                           -  Normal duodenal bulb and second portion of the                            duodenum.                           - Moderate Sedation:      Moderate (conscious) sedation was personally administered by an       anesthesia professional. The following parameters were monitored: oxygen        saturation, heart rate, blood pressure, respiratory rate, EKG, adequacy       of pulmonary ventilation, and response to care. Recommendation:           - Patient has a contact number available for                            emergencies. The signs and symptoms of potential                            delayed complications were discussed with the                            patient. Return  to normal activities tomorrow.                            Written discharge instructions were provided to the                            patient.                           - Advance diet as tolerated.                           - Continue present medications. Do not resume                            Eliquis  until 10/08/2023- Return to my office in 3                            months. See colonoscopy report. Procedure Code(s):        --- Professional ---                           [id number], Esophagogastroduodenoscopy, flexible,                            transoral; diagnostic, including collection of                            specimen(s) by brushing or washing, when performed                            (separate procedure) Diagnosis Code(s):        --- Professional ---  K76.6, Portal hypertension                           K31.89, Other diseases of stomach and duodenum                           D50.0, Iron  deficiency anemia secondary to blood                            loss (chronic) CPT copyright 2022 American Medical Association. All rights reserved. The codes documented in this report are preliminary and upon coder review may  be revised to meet current compliance requirements. Lamar HERO. Daci Stubbe, MD Lamar Ozell Hollingshead, MD 10/06/2023 1:49:18 PM This report has been signed electronically. Number of Addenda: 0

## 2023-10-06 NOTE — Anesthesia Preprocedure Evaluation (Signed)
 Anesthesia Evaluation  Patient identified by MRN, date of birth, ID band Patient awake    Reviewed: Allergy & Precautions, H&P , NPO status , Patient's Chart, lab work & pertinent test results, reviewed documented beta blocker date and time   Airway Mallampati: II  TM Distance: >3 FB Neck ROM: full    Dental no notable dental hx.    Pulmonary neg pulmonary ROS, asthma , sleep apnea , pneumonia, COPD, former smoker   Pulmonary exam normal breath sounds clear to auscultation       Cardiovascular Exercise Tolerance: Good hypertension, + CAD and +CHF  + dysrhythmias Atrial Fibrillation + Valvular Problems/Murmurs  Rhythm:regular Rate:Normal     Neuro/Psych negative neurological ROS  negative psych ROS   GI/Hepatic negative GI ROS, Neg liver ROS,GERD  ,,  Endo/Other  negative endocrine ROSdiabetes    Renal/GU Renal diseasenegative Renal ROS  negative genitourinary   Musculoskeletal   Abdominal   Peds  Hematology negative hematology ROS (+) Blood dyscrasia, anemia   Anesthesia Other Findings   Reproductive/Obstetrics negative OB ROS                              Anesthesia Physical Anesthesia Plan  ASA: 3  Anesthesia Plan: General   Post-op Pain Management:    Induction:   PONV Risk Score and Plan: Propofol  infusion  Airway Management Planned:   Additional Equipment:   Intra-op Plan:   Post-operative Plan:   Informed Consent: I have reviewed the patients History and Physical, chart, labs and discussed the procedure including the risks, benefits and alternatives for the proposed anesthesia with the patient or authorized representative who has indicated his/her understanding and acceptance.     Dental Advisory Given  Plan Discussed with: CRNA  Anesthesia Plan Comments:         Anesthesia Quick Evaluation

## 2023-10-06 NOTE — Discharge Instructions (Signed)
 EGD Discharge instructions Please read the instructions outlined below and refer to this sheet in the next few weeks. These discharge instructions provide you with general information on caring for yourself after you leave the hospital. Your doctor may also give you specific instructions. While your treatment has been planned according to the most current medical practices available, unavoidable complications occasionally occur. If you have any problems or questions after discharge, please call your doctor. ACTIVITY You may resume your regular activity but move at a slower pace for the next 24 hours.  Take frequent rest periods for the next 24 hours.  Walking will help expel (get rid of) the air and reduce the bloated feeling in your abdomen.  No driving for 24 hours (because of the anesthesia (medicine) used during the test).  You may shower.  Do not sign any important legal documents or operate any machinery for 24 hours (because of the anesthesia used during the test).  NUTRITION Drink plenty of fluids.  You may resume your normal diet.  Begin with a light meal and progress to your normal diet.  Avoid alcoholic beverages for 24 hours or as instructed by your caregiver.  MEDICATIONS You may resume your normal medications unless your caregiver tells you otherwise.  WHAT YOU CAN EXPECT TODAY You may experience abdominal discomfort such as a feeling of fullness or "gas" pains.  FOLLOW-UP Your doctor will discuss the results of your test with you.  SEEK IMMEDIATE MEDICAL ATTENTION IF ANY OF THE FOLLOWING OCCUR: Excessive nausea (feeling sick to your stomach) and/or vomiting.  Severe abdominal pain and distention (swelling).  Trouble swallowing.  Temperature over 101 F (37.8 C).  Rectal bleeding or vomiting of blood.    Colonoscopy Discharge Instructions  Read the instructions outlined below and refer to this sheet in the next few weeks. These discharge instructions provide you with  general information on caring for yourself after you leave the hospital. Your doctor may also give you specific instructions. While your treatment has been planned according to the most current medical practices available, unavoidable complications occasionally occur. If you have any problems or questions after discharge, call Dr. Shaaron at [phone]. ACTIVITY You may resume your regular activity, but move at a slower pace for the next 24 hours.  Take frequent rest periods for the next 24 hours.  Walking will help get rid of the air and reduce the bloated feeling in your belly (abdomen).  No driving for 24 hours (because of the medicine (anesthesia) used during the test).   Do not sign any important legal documents or operate any machinery for 24 hours (because of the anesthesia used during the test).  NUTRITION Drink plenty of fluids.  You may resume your normal diet as instructed by your doctor.  Begin with a light meal and progress to your normal diet. Heavy or fried foods are harder to digest and may make you feel sick to your stomach (nauseated).  Avoid alcoholic beverages for 24 hours or as instructed.  MEDICATIONS You may resume your normal medications unless your doctor tells you otherwise.  WHAT YOU CAN EXPECT TODAY Some feelings of bloating in the abdomen.  Passage of more gas than usual.  Spotting of blood in your stool or on the toilet paper.  IF YOU HAD POLYPS REMOVED DURING THE COLONOSCOPY: No aspirin  products for 7 days or as instructed.  No alcohol  for 7 days or as instructed.  Eat a soft diet for the next 24 hours.  FINDING  OUT THE RESULTS OF YOUR TEST Not all test results are available during your visit. If your test results are not back during the visit, make an appointment with your caregiver to find out the results. Do not assume everything is normal if you have not heard from your caregiver or the medical facility. It is important for you to follow up on all of your test  results.  SEEK IMMEDIATE MEDICAL ATTENTION IF: You have more than a spotting of blood in your stool.  Your belly is swollen (abdominal distention).  You are nauseated or vomiting.  You have a temperature over 101.  You have abdominal pain or discomfort that is severe or gets worse throughout the day.     2 polyps found and removed from your colon.  Tiny bleeding  blood vessels in your stomach were coagulated   further recommendations to follow pending review of pathology report do not resume Eliquis  prior to 10/08/2023   office visit with us  in 3 months   at patient request, call Chance Pierre at [phone]-  reviewed findings and recommendations

## 2023-10-06 NOTE — Transfer of Care (Signed)
 Immediate Anesthesia Transfer of Care Note  Patient: [name]  Procedure(s) Performed: COLONOSCOPY EGD (ESOPHAGOGASTRODUODENOSCOPY)  Patient Location: Short Stay  Anesthesia Type:MAC  Level of Consciousness: awake, alert , oriented, and patient cooperative  Airway & Oxygen  Therapy: Patient Spontanous Breathing  Post-op Assessment: Report given to RN and Post -op Vital signs reviewed and stable  Post vital signs: Reviewed and stable  Last Vitals:  Vitals Value Taken Time  BP 124/73 10/06/23 13:55  Temp    Pulse 83 10/06/23 13:55  Resp 20 10/06/23 13:55  SpO2 100% on RA 10/06/23 13:55    Last Pain:  Vitals:   10/06/23 1308  TempSrc:   PainSc: 0-No pain         Complications: No notable events documented.

## 2023-10-06 NOTE — Op Note (Signed)
 [facility] Patient Name: [name] Procedure Date: 10/06/2023 12:28 PM MRN: [id number] Date of Birth: [birth year] Attending MD: Lamar Ozell Hollingshead , MD, [id number] CSN: [id number] Age: 68 Admit Type: Outpatient Procedure:                Colonoscopy Indications:              Iron  deficiency anemia secondary to chronic blood                            loss Providers:                Lamar Ozell Hollingshead, MD, Crystal Page, Bascom Blush Referring MD:              Medicines:                Propofol  per Anesthesia Complications:            No immediate complications. Estimated Blood Loss:     Estimated blood loss was minimal. Procedure:                Pre-Anesthesia Assessment:                           - Prior to the procedure, a History and Physical                            was performed, and patient medications and                            allergies were reviewed. The patient's tolerance of                            previous anesthesia was also reviewed. The risks                            and benefits of the procedure and the sedation                            options and risks were discussed with the patient.                            All questions were answered, and informed consent                            was obtained. Prior Anticoagulants: The patient has                            taken no anticoagulant or antiplatelet agents. ASA                            Grade Assessment: III - A patient with severe                            systemic disease. After reviewing the risks and  benefits, the patient was deemed in satisfactory                            condition to undergo the procedure.                           After obtaining informed consent, the colonoscope                            was passed under direct vision. Throughout the                            procedure, the patient's blood pressure, pulse, and                             oxygen  saturations were monitored continuously. The                            [id number]) scope was introduced through the                            anus and advanced to the the cecum, identified by                            appendiceal orifice and ileocecal valve. Scope In: 1:32:15 PM Scope Out: 1:43:38 PM Scope Withdrawal Time: 0 hours 6 minutes 46 seconds  Total Procedure Duration: 0 hours 11 minutes 23 seconds  Findings:      The perianal and digital rectal examinations were normal. Redundant and       elongated colon. Diffuse pigmentation consistent with melanosis coli      Two sessile polyps were found in the cecum. The polyps were 4 to 5 mm in       size. These polyps were removed with a cold snare. Resection and       retrieval were complete. Estimated blood loss was minimal.      The exam was otherwise without abnormality on direct and retroflexion       views. Impression:               - Two 4 to 5 mm polyps in the cecum, removed with a                            cold snare. Resected and retrieved. Redundant and                            elongated colon. Melanosis coli                           - The examination was otherwise normal on direct                            and retroflexion views. Moderate Sedation:      Moderate (conscious) sedation was personally administered by an       anesthesia professional. The following parameters were monitored: oxygen        saturation, heart  rate, blood pressure, respiratory rate, EKG, adequacy       of pulmonary ventilation, and response to care. Recommendation:           - Patient has a contact number available for                            emergencies. The signs and symptoms of potential                            delayed complications were discussed with the                            patient. Return to normal activities tomorrow.                            Written discharge instructions were provided to the                             patient.                           - Advance diet as tolerated.                           - Continue present medications -wait till 7/11 to                            resume Eliquis                            - Repeat colonoscopy date to be determined after                            pending pathology results are reviewed for                            surveillance.                           - Return to GI office in 3 months. See EGD report Procedure Code(s):        --- Professional ---                           [phone], Colonoscopy, flexible; with removal of                            tumor(s), polyp(s), or other lesion(s) by snare                            technique Diagnosis Code(s):        --- Professional ---                           D12.0, Benign neoplasm of cecum  D50.0, Iron  deficiency anemia secondary to blood                            loss (chronic) CPT copyright 2022 [employer]. All rights reserved. The codes documented in this report are preliminary and upon coder review may  be revised to meet current compliance requirements. Lamar HERO. Zhara Gieske, MD Lamar Ozell Hollingshead, MD 10/06/2023 1:54:25 PM This report has been signed electronically. Number of Addenda: 0

## 2023-10-07 ENCOUNTER — Ambulatory Visit: Payer: Self-pay | Admitting: Internal Medicine

## 2023-10-08 ENCOUNTER — Encounter (HOSPITAL_COMMUNITY): Payer: Self-pay | Admitting: Internal Medicine

## 2023-10-08 LAB — SURGICAL PATHOLOGY

## 2023-10-08 NOTE — Anesthesia Postprocedure Evaluation (Signed)
 Anesthesia Post Note  Patient: [name]  Procedure(s) Performed: COLONOSCOPY EGD (ESOPHAGOGASTRODUODENOSCOPY)  Patient location during evaluation: Phase II Anesthesia Type: General Level of consciousness: awake Pain management: pain level controlled Vital Signs Assessment: post-procedure vital signs reviewed and stable Respiratory status: spontaneous breathing and respiratory function stable Cardiovascular status: blood pressure returned to baseline and stable Postop Assessment: no headache and no apparent nausea or vomiting Anesthetic complications: no Comments: Late entry   No notable events documented.   Last Vitals:  Vitals:   10/06/23 1137 10/06/23 1355  BP:  124/73  Pulse: (P) 86 87  Resp: (P) 16 16  Temp: (P) 36.6 C (!) 36.3 C  SpO2: (P) 96% 100%    Last Pain:  Vitals:   10/07/23 1305  TempSrc:   PainSc: 0-No pain                 [name]

## 2023-10-10 ENCOUNTER — Ambulatory Visit: Payer: Self-pay | Admitting: Internal Medicine

## 2023-10-21 DIAGNOSIS — M79675 Pain in left toe(s): Secondary | ICD-10-CM | POA: Diagnosis not present

## 2023-10-21 DIAGNOSIS — B351 Tinea unguium: Secondary | ICD-10-CM | POA: Diagnosis not present

## 2023-10-21 DIAGNOSIS — M79674 Pain in right toe(s): Secondary | ICD-10-CM | POA: Diagnosis not present

## 2023-10-22 DIAGNOSIS — N184 Chronic kidney disease, stage 4 (severe): Secondary | ICD-10-CM | POA: Diagnosis not present

## 2023-10-22 DIAGNOSIS — I5032 Chronic diastolic (congestive) heart failure: Secondary | ICD-10-CM | POA: Diagnosis not present

## 2023-10-25 ENCOUNTER — Inpatient Hospital Stay

## 2023-11-01 ENCOUNTER — Inpatient Hospital Stay: Admitting: Oncology

## 2023-11-09 ENCOUNTER — Inpatient Hospital Stay: Attending: Oncology

## 2023-11-09 ENCOUNTER — Other Ambulatory Visit: Payer: Self-pay | Admitting: "Endocrinology

## 2023-11-09 DIAGNOSIS — Z7901 Long term (current) use of anticoagulants: Secondary | ICD-10-CM | POA: Diagnosis not present

## 2023-11-09 DIAGNOSIS — E538 Deficiency of other specified B group vitamins: Secondary | ICD-10-CM | POA: Insufficient documentation

## 2023-11-09 DIAGNOSIS — I4891 Unspecified atrial fibrillation: Secondary | ICD-10-CM | POA: Insufficient documentation

## 2023-11-09 DIAGNOSIS — N185 Chronic kidney disease, stage 5: Secondary | ICD-10-CM | POA: Diagnosis not present

## 2023-11-09 DIAGNOSIS — K31811 Angiodysplasia of stomach and duodenum with bleeding: Secondary | ICD-10-CM | POA: Diagnosis not present

## 2023-11-09 DIAGNOSIS — D631 Anemia in chronic kidney disease: Secondary | ICD-10-CM | POA: Diagnosis not present

## 2023-11-09 DIAGNOSIS — D500 Iron deficiency anemia secondary to blood loss (chronic): Secondary | ICD-10-CM | POA: Diagnosis not present

## 2023-11-09 DIAGNOSIS — N189 Chronic kidney disease, unspecified: Secondary | ICD-10-CM | POA: Diagnosis not present

## 2023-11-09 DIAGNOSIS — R809 Proteinuria, unspecified: Secondary | ICD-10-CM | POA: Diagnosis not present

## 2023-11-09 DIAGNOSIS — E211 Secondary hyperparathyroidism, not elsewhere classified: Secondary | ICD-10-CM | POA: Diagnosis not present

## 2023-11-09 LAB — COMPREHENSIVE METABOLIC PANEL WITH GFR
ALT: 10 U/L (ref 0–44)
AST: 15 U/L (ref 15–41)
Albumin: 3.4 g/dL — ABNORMAL LOW (ref 3.5–5.0)
Alkaline Phosphatase: 77 U/L (ref 38–126)
Anion gap: 12 (ref 5–15)
BUN: 50 mg/dL — ABNORMAL HIGH (ref 8–23)
CO2: 26 mmol/L (ref 22–32)
Calcium: 9.5 mg/dL (ref 8.9–10.3)
Chloride: 95 mmol/L — ABNORMAL LOW (ref 98–111)
Creatinine, Ser: 1.88 mg/dL — ABNORMAL HIGH (ref 0.44–1.00)
GFR, Estimated: 29 mL/min — ABNORMAL LOW (ref >60)
Glucose, Bld: 235 mg/dL — ABNORMAL HIGH (ref 70–99)
Potassium: 3.6 mmol/L (ref 3.5–5.1)
Sodium: 133 mmol/L — ABNORMAL LOW (ref 135–145)
Total Bilirubin: 0.6 mg/dL (ref 0.0–1.2)
Total Protein: 7.5 g/dL (ref 6.5–8.1)

## 2023-11-09 LAB — CBC WITH DIFFERENTIAL/PLATELET
Abs Immature Granulocytes: 0.06 K/uL (ref 0.00–0.07)
Basophils Absolute: 0 K/uL (ref 0.0–0.1)
Basophils Relative: 0 %
Eosinophils Absolute: 0.2 K/uL (ref 0.0–0.5)
Eosinophils Relative: 3 %
HCT: 30.6 % — ABNORMAL LOW (ref 36.0–46.0)
Hemoglobin: 9.8 g/dL — ABNORMAL LOW (ref 12.0–15.0)
Immature Granulocytes: 1 %
Lymphocytes Relative: 10 %
Lymphs Abs: 0.7 K/uL (ref 0.7–4.0)
MCH: 30.4 pg (ref 26.0–34.0)
MCHC: 32 g/dL (ref 30.0–36.0)
MCV: 95 fL (ref 80.0–100.0)
Monocytes Absolute: 0.5 K/uL (ref 0.1–1.0)
Monocytes Relative: 7 %
Neutro Abs: 5.8 K/uL (ref 1.7–7.7)
Neutrophils Relative %: 79 %
Platelets: 278 K/uL (ref 150–400)
RBC: 3.22 MIL/uL — ABNORMAL LOW (ref 3.87–5.11)
RDW: 14.5 % (ref 11.5–15.5)
WBC: 7.3 K/uL (ref 4.0–10.5)
nRBC: 0 % (ref 0.0–0.2)

## 2023-11-09 LAB — FERRITIN: Ferritin: 244 ng/mL (ref 11–307)

## 2023-11-09 LAB — IRON AND TIBC
Iron: 48 ug/dL (ref 28–170)
Saturation Ratios: 17 % (ref 10.4–31.8)
TIBC: 291 ug/dL (ref 250–450)
UIBC: 243 ug/dL

## 2023-11-09 LAB — FOLATE: Folate: >40 ng/mL (ref >5.9)

## 2023-11-09 LAB — VITAMIN B12: Vitamin B-12: 1019 pg/mL — ABNORMAL HIGH (ref 180–914)

## 2023-11-11 ENCOUNTER — Encounter: Payer: Self-pay | Admitting: Oncology

## 2023-11-18 ENCOUNTER — Inpatient Hospital Stay (HOSPITAL_BASED_OUTPATIENT_CLINIC_OR_DEPARTMENT_OTHER): Admitting: Oncology

## 2023-11-18 VITALS — BP 109/57 | HR 69 | Temp 98.6°F | Resp 18 | Ht 62.5 in | Wt 234.7 lb

## 2023-11-18 DIAGNOSIS — D3A090 Benign carcinoid tumor of the bronchus and lung: Secondary | ICD-10-CM | POA: Diagnosis not present

## 2023-11-18 DIAGNOSIS — I4891 Unspecified atrial fibrillation: Secondary | ICD-10-CM | POA: Insufficient documentation

## 2023-11-18 DIAGNOSIS — D500 Iron deficiency anemia secondary to blood loss (chronic): Secondary | ICD-10-CM | POA: Diagnosis not present

## 2023-11-18 DIAGNOSIS — E538 Deficiency of other specified B group vitamins: Secondary | ICD-10-CM | POA: Diagnosis not present

## 2023-11-18 DIAGNOSIS — K31811 Angiodysplasia of stomach and duodenum with bleeding: Secondary | ICD-10-CM | POA: Diagnosis not present

## 2023-11-18 DIAGNOSIS — N185 Chronic kidney disease, stage 5: Secondary | ICD-10-CM

## 2023-11-18 DIAGNOSIS — Z7901 Long term (current) use of anticoagulants: Secondary | ICD-10-CM | POA: Diagnosis not present

## 2023-11-18 NOTE — Assessment & Plan Note (Addendum)
 Patient has chronic anemia likely secondary to iron  deficiency and chronic kidney disease.   Multiple myeloma workup: SPEP: No M spike, IFE: Normal.  Slightly elevated free light chains with normal ratio Patient had acute GI bleeding last month with drop in hemoglobin.  Endoscopy at that time showed gastric antral vascular ectasia s/p [facility] PC treatment  -Labs reviewed today: CMP with slight worsening of creatinine, CBC: Anemia with a hemoglobin of 9.8, iron  panel: TSAT: 17, ferritin: 244. -Will administer 1 more dose of IV iron .  Discussed status versus benefits again in detail.  Side effects including allergic reactions, nausea and headaches were discussed considering patient has chronic kidney disease will keep a TSAT of 25-30 as long as ferritin is less than 200. -Continue iron  pills every other day -Continue to follow-up with GI  Return to clinic in 3 months with labs to assess for further need of IV iron 

## 2023-11-18 NOTE — Assessment & Plan Note (Addendum)
 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  She is asymptomatic currently.  -Will repeat CT scan in 1 year that is 12/2023.  Ordered today -Continue to monitor

## 2023-11-18 NOTE — Assessment & Plan Note (Addendum)
 Vitamin B12 levels normal today  - Continue oral vitamin B12 1000 mcg daily

## 2023-11-18 NOTE — Assessment & Plan Note (Addendum)
 Patient has a history of CKD following with Dr. Wolfgang Phoenix.  Was previously on EPO shots for anemia. -Continue to follow with Dr. Wolfgang Phoenix -If anemia does not correct with iron replacement, will consider EPO shots

## 2023-11-18 NOTE — Progress Notes (Signed)
 [facility] at [facility]  HEMATOLOGY FOLLOW-UP VISIT  [name], Benita Area, MD  REASON FOR FOLLOW-UP: Iron  deficiency anemia  ASSESSMENT & PLAN:  Patient is a 68 y.o. female female following for iron  deficiency anemia.    Assessment & Plan Iron  deficiency anemia due to chronic blood loss Patient has chronic anemia likely secondary to iron  deficiency and chronic kidney disease.   Multiple myeloma workup: SPEP: No M spike, IFE: Normal.  Slightly elevated free light chains with normal ratio Patient had acute GI bleeding last month with drop in hemoglobin.  Endoscopy at that time showed gastric antral vascular ectasia s/p [facility] PC treatment  -Labs reviewed today: CMP with slight worsening of creatinine, CBC: Anemia with a hemoglobin of 9.8, iron  panel: TSAT: 17, ferritin: 244. -Will administer 1 more dose of IV iron .  Discussed status versus benefits again in detail.  Side effects including allergic reactions, nausea and headaches were discussed considering patient has chronic kidney disease will keep a TSAT of 25-30 as long as ferritin is less than 200. 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  She is asymptomatic currently.  -Will repeat CT scan in 1 year that is 12/2023.  Ordered today -Continue to monitor Stage 5 chronic kidney disease not on chronic dialysis [facility]) Patient has a history of CKD following with Dr. Rachele.  Was previously on EPO shots for anemia.  -Continue to follow with Dr. Rachele -If anemia does not correct with iron  replacement, will consider EPO shots Atrial fibrillation, unspecified type [facility]) Patient with a history of A-fib on Eliquis  Risk versus benefits discussed with patient  - Continue Eliquis  as prescribed Vitamin B12  deficiency Vitamin B12 levels normal today  - Continue oral vitamin B12 1000 mcg daily    Orders Placed This Encounter  Procedures   CT CHEST W CONTRAST    Standing Status:   Future    Expected Date:   02/18/2024    Expiration Date:   11/17/2024    If indicated for the ordered procedure, I authorize the administration of contrast media per Radiology protocol:   Yes    Does the patient have a contrast media/X-ray dye allergy?:   No    Preferred imaging location?:   [facility]   CBC with Differential    Standing Status:   Future    Expected Date:   02/14/2024    Expiration Date:   05/14/2024   Comprehensive metabolic panel    Standing Status:   Future    Expected Date:   02/14/2024    Expiration Date:   05/14/2024   Iron  and TIBC ([facility] ONLY)    Standing Status:   Future    Expected Date:   02/14/2024    Expiration Date:   05/14/2024   Ferritin    Standing Status:   Future    Expected Date:   02/14/2024    Expiration Date:   05/14/2024   Vitamin B12    Standing Status:   Future    Expected Date:   02/14/2024    Expiration Date:   05/14/2024   Folate    Standing Status:   Future    Expected Date:   02/14/2024    Expiration Date:   05/14/2024    The  total time spent in the appointment was 30 minutes encounter with patients including review of chart and various tests results, discussions about plan of care and coordination of care plan   All questions were answered. The patient knows to call the clinic with any problems, questions or concerns. No barriers to learning was detected.   LILLETTE Verneta SAUNDERS Teague,acting as a Neurosurgeon for Mickiel Dry, MD.,have documented all relevant documentation on the behalf of Mickiel Dry, MD,as directed by  Mickiel Dry, MD while in the presence of Mickiel Dry, MD.  I, Mickiel Dry MD, have reviewed the above documentation for accuracy and completeness, and I agree with the above.     Mickiel Dry,  MD 8/21/20254:58 PM   SUMMARY OF HEMATOLOGIC HISTORY: - Iron  deficiency anemia likely secondary to GI bleeding - S/p IV Monoferric  1000 mg on 02/23/2023, 05/06/2023, 07/30/2023 and 08/31/2023. - SPEP: No M spike, IFE: Normal, slight elevated free light chains with normal FLC ratio.   INTERVAL HISTORY: YULEIMY KRETZ 68 y.o. female following for iron  deficiency anemia.  Patient is accompanied by her legal guardian today.   Since her last visit, she underwent a colonoscopy on 10/06/2023 which found two 4-5 mm polyps in the cecum negative for high-grade dyplasia or malignancy. Her legal guardian reports Aviannah has a worsening cough over the past few months with associated dyspnea. She does have COPD and is using inhalers, which somewhat improves dyspnea. She is taking iron  supplements every other day and vitamin B12 supplements as prescribed.   I have reviewed the past medical history, past surgical history, social history and family history with the patient.  ALLERGIES:  has no known allergies.  MEDICATIONS:  Current Outpatient Medications  Medication Sig Dispense Refill   acetaminophen  (TYLENOL ) 325 MG tablet Take 325 mg by mouth every 12 (twelve) hours as needed for moderate pain (pain score 4-6).     albuterol  (PROVENTIL  HFA;VENTOLIN  HFA) 108 (90 Base) MCG/ACT inhaler Inhale 2 puffs into the lungs every 4 (four) hours as needed for shortness of breath.     apixaban  (ELIQUIS ) 5 MG TABS tablet Take 1 tablet (5 mg total) by mouth 2 (two) times daily. 60 tablet 3   benzonatate  (TESSALON ) 100 MG capsule Take 100 mg by mouth 3 (three) times daily.     bisacodyl  5 MG EC tablet As directed for procedure instructions 8 tablet 0   calcitRIOL  (ROCALTROL ) 0.25 MCG capsule Take 0.25 mcg by mouth every Monday, Wednesday, and Friday.     cetirizine  (ZYRTEC ) 10 MG tablet Take 10 mg by mouth daily.     Cholecalciferol  (VITAMIN D3) 50 MCG (2000 UT) TABS TAKE 1 TABLET BY MOUTH ONCE DAILY. 30 tablet 2    cyanocobalamin  (VITAMIN B12) 1000 MCG tablet Take 1 tablet (1,000 mcg total) by mouth daily. 90 tablet 2   diltiazem  (CARDIZEM  CD) 240 MG 24 hr capsule Take 1 capsule (240 mg total) by mouth daily. 30 capsule 0   DROPSAFE SAFETY PEN NEEDLES 31G X 6 MM MISC      EASYMAX TEST test strip CHECK BLOOD SUGAR 4 TIMES A DAY. (BREAKFAST, LUNCH, SUPPER & BEDTIME) 200 strip 0   ferrous sulfate  325 (65 FE) MG tablet Take 1 tablet (325 mg total) by mouth every other day. 90 tablet 3   Finerenone  (KERENDIA ) 10 MG TABS Take 10 mg by mouth daily.     fluticasone  (FLONASE ) 50 MCG/ACT nasal spray Place 2 sprays into both nostrils daily as needed for allergies.  folic acid  (FOLVITE ) 1 MG tablet Take 1 tablet (1 mg total) by mouth daily. 60 tablet 2   furosemide  (LASIX ) 20 MG tablet TAKE 1 TABLET BY MOUTH EVERY OTHER DAY,ALTERNATE WITH 40 MG TABLET. 45 tablet 3   furosemide  (LASIX ) 40 MG tablet TAKE 1 TABLET BY MOUTH EVERY OTHER DAY,ALTERNATE WITH 20 MG TABLET. 90 tablet 0   GERI-TUSSIN DM 10-100 MG/5ML liquid Take by mouth.     hydrOXYzine (ATARAX) 10 MG tablet Take 10 mg by mouth 3 (three) times daily.     insulin  regular human CONCENTRATED (HUMULIN  R U-500 KWIKPEN) 500 UNIT/ML KwikPen INJECT 50 UNITS SUBCUTANEOUSLY AT BREAKFAST, 20 UNITS AT LUNCH & 40 UNITS AT SUPPER; WHEN GLUCOSE IS ABOVE 90 & EATING.(HOLD IF BS BELOW 70: CALL MD IF BS ABOVE 400) 12 mL 0   ipratropium-albuterol  (DUONEB) 0.5-2.5 (3) MG/3ML SOLN Take 3 mLs by nebulization every 6 (six) hours as needed (for cough).     isosorbide  dinitrate (ISORDIL ) 10 MG tablet Take 1 tablet (10 mg total) by mouth 3 (three) times daily. 90 tablet 0   Lancets 30G MISC Use to check blood glucose four times daily as instructed. 100 each 2   loperamide (IMODIUM) 2 MG capsule Take 2-4 mg by mouth See admin instructions. Take 4 mg by mouth now: then 2 mg after each loose stool as needed, do not exceed 5 doses in 24 hours.     losartan  (COZAAR ) 25 MG tablet Take 12.5 mg  by mouth daily.     metolazone (ZAROXOLYN) 5 MG tablet Take 5 mg by mouth in the morning.     metoprolol  tartrate (LOPRESSOR ) 50 MG tablet Take 50 mg by mouth 2 (two) times daily.     montelukast  (SINGULAIR ) 10 MG tablet Take 10 mg by mouth at bedtime.     pantoprazole  (PROTONIX ) 40 MG tablet Take 1 tablet (40 mg total) by mouth 2 (two) times daily before a meal. 60 tablet 5   polyethylene glycol powder (GLYCOLAX /MIRALAX ) 17 GM/SCOOP powder Take 17 g by mouth daily as needed for mild constipation or moderate constipation.     potassium chloride  SA (KLOR-CON  M) 20 MEQ tablet Take 20 mEq by mouth daily.     rosuvastatin  (CRESTOR ) 10 MG tablet Take 10 mg by mouth at bedtime.     simethicone  (SIMETHICONE  DROPS INFANTS) 40 MG/0.6ML drops As directed for colonoscopy instructions 30 mL 0   sodium phosphate  (FLEET) ENEM Place 133 mLs (1 enema total) rectally as directed. 133 mL 0   TRELEGY ELLIPTA 100-62.5-25 MCG/ACT AEPB Inhale 1 puff into the lungs daily.     triamcinolone cream (KENALOG) 0.5 % Apply 1 application  topically every 12 (twelve) hours as needed (applied to bilateral legs for allergic dermatitis).     No current facility-administered medications for this visit.     REVIEW OF SYSTEMS:   Constitutional: Denies fevers, chills or night sweats Eyes: Denies blurriness of vision Ears, nose, mouth, throat, and face: Denies mucositis or sore throat Respiratory: Positive for cough, Denies dyspnea or wheezes Cardiovascular: Denies palpitation, chest discomfort or lower extremity swelling Gastrointestinal:  Positive for nausea , Denies heartburn or change in bowel habits Skin: Denies abnormal skin rashes Lymphatics: Denies new lymphadenopathy or easy bruising Neurological:Denies numbness, tingling or new weaknesses Behavioral/Psych: Mood is stable, no new changes  All other systems were reviewed with the patient and are negative.  PHYSICAL EXAMINATION:   Vitals:   11/18/23 1043  BP: (!)  109/57  Pulse: 69  Resp: 18  Temp: 98.6 F (37 C)  SpO2: 100%     GENERAL:alert, no distress and comfortable LUNGS: clear to auscultation and percussion with normal breathing effort HEART: regular rate & rhythm and no murmurs and no lower extremity edema ABDOMEN:abdomen soft, non-tender and normal bowel sounds Musculoskeletal:no cyanosis of digits and no clubbing  NEURO: alert & oriented x 3 with fluent speech  LABORATORY DATA:  I have reviewed the data as listed  Lab Results  Component Value Date   WBC 7.3 11/09/2023   NEUTROABS 5.8 11/09/2023   HGB 9.8 (L) 11/09/2023   HCT 30.6 (L) 11/09/2023   MCV 95.0 11/09/2023   PLT 278 11/09/2023       Chemistry      Component Value Date/Time   NA 133 (L) 11/09/2023 1002   NA 136 10/06/2022 0802   K 3.6 11/09/2023 1002   CL 95 (L) 11/09/2023 1002   CO2 26 11/09/2023 1002   BUN 50 (H) 11/09/2023 1002   BUN 53 (H) 10/06/2022 0802   CREATININE 1.88 (H) 11/09/2023 1002   CREATININE 2.39 (H) 01/27/2023 1043   CREATININE 1.57 (H) 09/12/2019 0831   GLU 169 05/07/2020 0000      Component Value Date/Time   CALCIUM  9.5 11/09/2023 1002   CALCIUM  7.8 (L) 05/31/2018 0609   ALKPHOS 77 11/09/2023 1002   AST 15 11/09/2023 1002   AST 9 (L) 01/27/2023 1043   ALT 10 11/09/2023 1002   ALT 5 01/27/2023 1043   BILITOT 0.6 11/09/2023 1002   BILITOT 0.4 01/27/2023 1043      Latest Reference Range & Units 11/09/23 10:02  Iron  28 - 170 ug/dL 48  UIBC ug/dL 756  TIBC 749 - 549 ug/dL 708  Saturation Ratios 10.4 - 31.8 % 17  Ferritin 11 - 307 ng/mL 244  Folate >5.9 ng/mL >40.0  Vitamin B12 180 - 914 pg/mL 1,019 (H)  (H): Data is abnormally high   Latest Reference Range & Units 06/28/23 13:15  Total Protein ELP 6.0 - 8.5 g/dL 6.4 (C)  Albumin SerPl Elph-Mcnc 2.9 - 4.4 g/dL 3.0 (C)  Albumin/Glob SerPl 0.7 - 1.7  0.9 (C)  Alpha2 Glob SerPl Elph-Mcnc 0.4 - 1.0 g/dL 1.0 (C)  Alpha 1 0.0 - 0.4 g/dL 0.4 (C)  Gamma Glob SerPl Elph-Mcnc 0.4  - 1.8 g/dL 1.2 (C)  M Protein SerPl Elph-Mcnc Not Observed g/dL Not Observed (C)  IFE 1  Comment (C)  Globulin, Total 2.2 - 3.9 g/dL 3.4 (C)  B-Globulin SerPl Elph-Mcnc 0.7 - 1.3 g/dL 0.9 (C)  IgG (Immunoglobin G), Serum 586 - 1,602 mg/dL 8,796  IgM (Immunoglobulin M), Srm 26 - 217 mg/dL 38  IgA 87 - 647 mg/dL 707  (C): Corrected IFE 1 Comment VC       Comment: (NOTE) The immunofixation pattern appears unremarkable. Evidence of monoclonal protein is not apparent.    Latest Reference Range & Units 06/28/23 13:15  Kappa free light chain 3.3 - 19.4 mg/L 80.4 (H)  Lambda free light chains 5.7 - 26.3 mg/L 53.0 (H)  Kappa, lambda light chain ratio 0.26 - 1.65  1.52  (H): Data is abnormally high  Endoscopy: 10/06/2023: Impression:  - Normal esophagus.  - Portal hypertensive gastropathy.  - GAVE with active oozing. Status post [facility] PC treatment. - Normal duodenal bulb and second portion of the duodenum.  Colonoscopy: 10/06/2023: Impression:  - Two 4 to 5 mm polyps in the cecum, removed with a cold snare. Resected and retrieved.  Redundant and elongated colon. Melanosis coli  - The examination was otherwise normal on direct and retroflexion views.

## 2023-11-18 NOTE — Assessment & Plan Note (Signed)
 Patient with a history of A-fib on Eliquis  Risk versus benefits discussed with patient  - Continue Eliquis  as prescribed

## 2023-11-18 NOTE — Patient Instructions (Signed)
 [facility] at [facility] Discharge Instructions   You were seen and examined today by Dr. Davonna.  She reviewed the results of your lab work which are normal/stable. Your iron  saturation is low and your hemoglobin is 9.8. We will arrange for you to have an iron  infusion.   We will see you back in 3 months. We will repeat lab work and a CT scan prior to this visit.    Return as scheduled.    Thank you for choosing [facility] at [facility] to provide your oncology and hematology care.  To afford each patient quality time with our provider, please arrive at least 15 minutes before your scheduled appointment time.   If you have a lab appointment with the Cancer Center please come in thru the Main Entrance and check in at the main information desk.  You need to re-schedule your appointment should you arrive 10 or more minutes late.  We strive to give you quality time with our providers, and arriving late affects you and other patients whose appointments are after yours.  Also, if you no show three or more times for appointments you may be dismissed from the clinic at the providers discretion.     Again, thank you for choosing [facility].  Our hope is that these requests will decrease the amount of time that you wait before being seen by our physicians.       _____________________________________________________________  Should you have questions after your visit to [facility], please contact our office at [phone] and follow the prompts.  Our office hours are 8:00 a.m. and 4:30 p.m. Monday - Friday.  Please note that voicemails left after 4:00 p.m. may not be returned until the following business day.  We are closed weekends and major holidays.  You do have access to a nurse 24-7, just call the main number to the clinic [phone] and do not press any options, hold on the line and a nurse will answer the phone.     For prescription refill requests, have your pharmacy contact our office and allow 72 hours.    Due to Covid, you will need to wear a mask upon entering the hospital. If you do not have a mask, a mask will be given to you at the Main Entrance upon arrival. For doctor visits, patients may have 1 support person age 26 or older with them. For treatment visits, patients can not have anyone with them due to social distancing guidelines and our immunocompromised population.

## 2023-11-22 DIAGNOSIS — I5032 Chronic diastolic (congestive) heart failure: Secondary | ICD-10-CM | POA: Diagnosis not present

## 2023-11-22 DIAGNOSIS — N184 Chronic kidney disease, stage 4 (severe): Secondary | ICD-10-CM | POA: Diagnosis not present

## 2023-11-26 ENCOUNTER — Inpatient Hospital Stay

## 2023-11-26 VITALS — BP 100/51 | HR 62 | Temp 97.9°F | Resp 18

## 2023-11-26 DIAGNOSIS — N185 Chronic kidney disease, stage 5: Secondary | ICD-10-CM | POA: Diagnosis not present

## 2023-11-26 DIAGNOSIS — D500 Iron deficiency anemia secondary to blood loss (chronic): Secondary | ICD-10-CM

## 2023-11-26 DIAGNOSIS — Z7901 Long term (current) use of anticoagulants: Secondary | ICD-10-CM | POA: Diagnosis not present

## 2023-11-26 DIAGNOSIS — K31811 Angiodysplasia of stomach and duodenum with bleeding: Secondary | ICD-10-CM | POA: Diagnosis not present

## 2023-11-26 DIAGNOSIS — E538 Deficiency of other specified B group vitamins: Secondary | ICD-10-CM | POA: Diagnosis not present

## 2023-11-26 DIAGNOSIS — I4891 Unspecified atrial fibrillation: Secondary | ICD-10-CM | POA: Diagnosis not present

## 2023-11-26 MED ORDER — SODIUM CHLORIDE 0.9 % IV SOLN
1000.0000 mg | Freq: Once | INTRAVENOUS | Status: AC
  Administered 2023-11-26: 1000 mg via INTRAVENOUS
  Filled 2023-11-26: qty 10

## 2023-11-26 MED ORDER — ACETAMINOPHEN 325 MG PO TABS
650.0000 mg | ORAL_TABLET | Freq: Once | ORAL | Status: AC
  Administered 2023-11-26: 650 mg via ORAL
  Filled 2023-11-26: qty 2

## 2023-11-26 MED ORDER — SODIUM CHLORIDE 0.9 % IV SOLN: Freq: Once | INTRAVENOUS | Status: AC

## 2023-11-26 MED ORDER — CETIRIZINE HCL 10 MG/ML IV SOLN
10.0000 mg | Freq: Once | INTRAVENOUS | Status: AC
  Administered 2023-11-26: 10 mg via INTRAVENOUS
  Filled 2023-11-26: qty 1

## 2023-11-26 NOTE — Progress Notes (Signed)
 Patient tolerated iron  infusion with no complaints voiced.  Peripheral IV site clean and dry with good blood return noted before and after infusion.  Pt observed for 30 minutes post iron  infusion without any complications. VSS with discharge and left in satisfactory condition with no s/s of distress noted. All follow ups as scheduled.  Adamariz Gillott

## 2023-11-26 NOTE — Patient Instructions (Signed)

## 2023-12-01 ENCOUNTER — Ambulatory Visit

## 2023-12-02 ENCOUNTER — Other Ambulatory Visit: Payer: Self-pay | Admitting: "Endocrinology

## 2023-12-02 DIAGNOSIS — D638 Anemia in other chronic diseases classified elsewhere: Secondary | ICD-10-CM | POA: Diagnosis not present

## 2023-12-02 DIAGNOSIS — N184 Chronic kidney disease, stage 4 (severe): Secondary | ICD-10-CM | POA: Diagnosis not present

## 2023-12-02 DIAGNOSIS — E1122 Type 2 diabetes mellitus with diabetic chronic kidney disease: Secondary | ICD-10-CM

## 2023-12-02 DIAGNOSIS — I5032 Chronic diastolic (congestive) heart failure: Secondary | ICD-10-CM | POA: Diagnosis not present

## 2023-12-08 DIAGNOSIS — Z23 Encounter for immunization: Secondary | ICD-10-CM | POA: Diagnosis not present

## 2023-12-14 ENCOUNTER — Encounter: Payer: Self-pay | Admitting: "Endocrinology

## 2023-12-14 ENCOUNTER — Ambulatory Visit (INDEPENDENT_AMBULATORY_CARE_PROVIDER_SITE_OTHER): Admitting: "Endocrinology

## 2023-12-14 VITALS — BP 106/58 | HR 60 | Ht 62.5 in | Wt 236.2 lb

## 2023-12-14 DIAGNOSIS — E1122 Type 2 diabetes mellitus with diabetic chronic kidney disease: Secondary | ICD-10-CM

## 2023-12-14 DIAGNOSIS — Z794 Long term (current) use of insulin: Secondary | ICD-10-CM | POA: Diagnosis not present

## 2023-12-14 DIAGNOSIS — I10 Essential (primary) hypertension: Secondary | ICD-10-CM

## 2023-12-14 DIAGNOSIS — N184 Chronic kidney disease, stage 4 (severe): Secondary | ICD-10-CM

## 2023-12-14 DIAGNOSIS — E782 Mixed hyperlipidemia: Secondary | ICD-10-CM | POA: Diagnosis not present

## 2023-12-14 LAB — POCT GLYCOSYLATED HEMOGLOBIN (HGB A1C): HbA1c, POC (controlled diabetic range): 5.4 % (ref 0.0–7.0)

## 2023-12-14 MED ORDER — HUMULIN R U-500 KWIKPEN 500 UNIT/ML ~~LOC~~ SOPN: 50.0000 [IU] | PEN_INJECTOR | Freq: Two times a day (BID) | SUBCUTANEOUS | 0 refills | Status: DC

## 2023-12-14 NOTE — Patient Instructions (Signed)

## 2023-12-14 NOTE — Progress Notes (Signed)
 12/14/2023  Endocrinology follow-up note   Subjective:    Patient ID: [name], female    DOB: 1956/03/16, PCP Anne Benita Area, [name]   Past Medical History:  Diagnosis Date   Arthritis    Asthma    Atrial fibrillation [facility])    Carcinoid tumor determined by biopsy of lung 05/2018   Left lung   Chronic diastolic CHF (congestive heart failure) (HCC)    CKD (chronic kidney disease), stage III (HCC)    Diabetes mellitus type 2 in obese    Essential hypertension    GERD (gastroesophageal reflux disease)    Heart murmur    Iron  deficiency anemia 10/16/2010   Mental handicap 10/16/2010   Mild CAD 2013   Morbid obesity (HCC)    OSA (obstructive sleep apnea)    Past Surgical History:  Procedure Laterality Date   ABDOMINAL HYSTERECTOMY     AV FISTULA PLACEMENT Right 06/06/2018   Procedure: ARTERIOVENOUS (AV) FISTULA CREATION;  Surgeon: Gretta Lonni PARAS, [name];  Location: [facility] OR;  Service: Vascular;  Laterality: Right;   BIOPSY  10/16/2021   Procedure: BIOPSY;  Surgeon: Shaaron Lamar HERO, [name];  [facility];  Service: Endoscopy;;   BRONCHIAL DILITATION  12/13/2020   Procedure: BRONCHIAL DILITATION;  Surgeon: Brenna Adine CROME, DO;  [facility]: [facility];  Service: Pulmonary;;   CESAREAN SECTION     CHOLECYSTECTOMY     COLONOSCOPY  08/2010   normal TI, sigmoid polyp (adenoma ). Next TCS due  08/2015,   COLONOSCOPY N/A 10/06/2023   Procedure: COLONOSCOPY;  Surgeon: Shaaron Lamar HERO, [name];  [facility];  Service: Endoscopy;  Laterality: N/A;  1:00pm, asa 3,a t [place]   COLONOSCOPY WITH PROPOFOL  N/A 07/08/2020   Procedure: COLONOSCOPY WITH PROPOFOL ;  Surgeon: Shaaron Lamar HERO, [name];  [facility];  Service: Endoscopy;  Laterality: N/A;  AM (diabetic and facility patient)   ESOPHAGOGASTRODUODENOSCOPY  08/2010   antral and duodenal erosions s/p bx (chronic gastritis, no h.pylori, no celiac dz ), hiatal hernia   ESOPHAGOGASTRODUODENOSCOPY N/A 10/06/2023    Procedure: EGD (ESOPHAGOGASTRODUODENOSCOPY);  Surgeon: Shaaron Lamar HERO, [name];  [facility];  Service: Endoscopy;  Laterality: N/A;   ESOPHAGOGASTRODUODENOSCOPY (EGD) WITH PROPOFOL  N/A 10/16/2021   Procedure: ESOPHAGOGASTRODUODENOSCOPY (EGD) WITH PROPOFOL ;  Surgeon: Shaaron Lamar HERO, [name];  [facility];  Service: Endoscopy;  Laterality: N/A;  11:45am   ESOPHAGOGASTRODUODENOSCOPY (EGD) WITH PROPOFOL  N/A 12/18/2021   Procedure: ESOPHAGOGASTRODUODENOSCOPY (EGD) WITH PROPOFOL ;  Surgeon: Cindie Carlin POUR, DO;  [facility];  Service: Endoscopy;  Laterality: N/A;   ESOPHAGOGASTRODUODENOSCOPY (EGD) WITH PROPOFOL  N/A 01/19/2023   Procedure: ESOPHAGOGASTRODUODENOSCOPY (EGD) WITH PROPOFOL ;  Surgeon: Eartha Angelia Sieving, [name];  [facility];  Service: Gastroenterology;  Laterality: N/A;  2:30 pm, asa 3   FISTULA SUPERFICIALIZATION Right 08/25/2018   Procedure: FISTULA SUPERFICIALIZATION RIGHT ARM;  Surgeon: Sheree Penne Lonni, [name];  [facility] OR;  Service: Cardiovascular;  Laterality: Right;   HOT HEMOSTASIS  12/18/2021   Procedure: HOT HEMOSTASIS (ARGON PLASMA COAGULATION/BICAP);  Surgeon: Cindie Carlin POUR, DO;  [facility];  Service: Endoscopy;;   HOT HEMOSTASIS  01/19/2023   Procedure: HOT HEMOSTASIS (ARGON PLASMA COAGULATION/BICAP);  Surgeon: Eartha Angelia, Sieving, [name];  [facility];  Service: Gastroenterology;;   IR FLUORO GUIDE CV LINE RIGHT  05/31/2018   IR US  GUIDE VASC ACCESS RIGHT  05/31/2018   KNEE SURGERY     right knee @ 68 years  of age   LEFT AND RIGHT HEART CATHETERIZATION WITH CORONARY ANGIOGRAM N/A 09/21/2011   Procedure: LEFT AND RIGHT HEART CATHETERIZATION WITH CORONARY ANGIOGRAM;  Surgeon: Salena GORMAN Negri, [name];  Location: [facility];  Service: Cardiovascular;  Laterality: N/A;   POLYPECTOMY  07/08/2020   Procedure: POLYPECTOMY INTESTINAL;  Surgeon: Shaaron Lamar HERO, [name];  [facility];  Service: Endoscopy;;    VIDEO BRONCHOSCOPY Left 12/13/2020   Procedure: VIDEO BRONCHOSCOPY WITHOUT FLUORO;  Surgeon: Brenna Adine CROME, DO;  Location: [facility];  Service: Pulmonary;  Laterality: Left;  Cryotherapy   Social History   Socioeconomic History   Marital status: Divorced    Spouse name: Not on file   Number of children: 1   Years of education: Not on file   Highest education level: Not on file  Occupational History    Employer: UNEMPLOYED  Tobacco Use   Smoking status: Former    Current packs/day: 0.00    Types: Cigarettes    Start date: 04/11/1996    Quit date: 04/11/1996    Years since quitting: 27.6   Smokeless tobacco: Never   Tobacco comments:    quit a couple year ago  Vaping Use   Vaping status: Never Used  Substance and Sexual Activity   Alcohol  use: No   Drug use: No   Sexual activity: Never  Other Topics Concern   Not on file  Social History Narrative   Lives with parents.    Social Drivers of Corporate investment banker Strain: Not on file  Food Insecurity: No Food Insecurity (12/17/2021)   Hunger Vital Sign    Worried About Running Out of Food in the Last Year: Never true    Ran Out of Food in the Last Year: Never true  Transportation Needs: No Transportation Needs (12/17/2021)   PRAPARE - Administrator, Civil Service (Medical): No    Lack of Transportation (Non-Medical): No  Physical Activity: Not on file  Stress: Not on file  Social Connections: Not on file   Outpatient Encounter Medications as of 12/14/2023  Medication Sig   acetaminophen  (TYLENOL ) 325 MG tablet Take 325 mg by mouth every 12 (twelve) hours as needed for moderate pain (pain score 4-6).   albuterol  (PROVENTIL  HFA;VENTOLIN  HFA) 108 (90 Base) MCG/ACT inhaler Inhale 2 puffs into the lungs every 4 (four) hours as needed for shortness of breath.   apixaban  (ELIQUIS ) 5 MG TABS tablet Take 1 tablet (5 mg total) by mouth 2 (two) times daily.   benzonatate  (TESSALON ) 100 MG capsule Take 100 mg by  mouth 3 (three) times daily.   bisacodyl  5 MG EC tablet As directed for procedure instructions   calcitRIOL  (ROCALTROL ) 0.25 MCG capsule Take 0.25 mcg by mouth every Monday, Wednesday, and Friday.   cetirizine  (ZYRTEC ) 10 MG tablet Take 10 mg by mouth daily.   Cholecalciferol  (VITAMIN D3) 50 MCG (2000 UT) TABS TAKE 1 TABLET BY MOUTH ONCE DAILY.   cyanocobalamin  (VITAMIN B12) 1000 MCG tablet Take 1 tablet (1,000 mcg total) by mouth daily.   diltiazem  (CARDIZEM  CD) 240 MG 24 hr capsule Take 1 capsule (240 mg total) by mouth daily.   DROPSAFE SAFETY PEN NEEDLES 31G X 6 MM MISC    EASYMAX TEST test strip CHECK BLOOD SUGAR 4 TIMES A DAY. (BREAKFAST, LUNCH, SUPPER & BEDTIME)   ferrous sulfate  325 (65 FE) MG tablet Take 1 tablet (325 mg total) by mouth every other day.   Finerenone  (KERENDIA ) 10  MG TABS Take 10 mg by mouth daily.   fluticasone  (FLONASE ) 50 MCG/ACT nasal spray Place 2 sprays into both nostrils daily as needed for allergies.   folic acid  (FOLVITE ) 1 MG tablet Take 1 tablet (1 mg total) by mouth daily.   furosemide  (LASIX ) 20 MG tablet TAKE 1 TABLET BY MOUTH EVERY OTHER DAY,ALTERNATE WITH 40 MG TABLET.   furosemide  (LASIX ) 40 MG tablet TAKE 1 TABLET BY MOUTH EVERY OTHER DAY,ALTERNATE WITH 20 MG TABLET.   GERI-TUSSIN DM 10-100 MG/5ML liquid Take by mouth.   hydrOXYzine (ATARAX) 10 MG tablet Take 10 mg by mouth 3 (three) times daily.   insulin  regular human CONCENTRATED (HUMULIN  R U-500 KWIKPEN) 500 UNIT/ML KwikPen Inject 50 Units into the skin 2 (two) times daily with a meal. INJECT 50 UNITS SUBCUTANEOUSLY AT BREAKFAST, 50 UNITS AT SUPPER; WHEN GLUCOSE IS ABOVE 90 & EATING.(HOLD IF BS BELOW 70: CALL [name] IF BS ABOVE 400)   ipratropium-albuterol  (DUONEB) 0.5-2.5 (3) MG/3ML SOLN Take 3 mLs by nebulization every 6 (six) hours as needed (for cough).   isosorbide  dinitrate (ISORDIL ) 10 MG tablet Take 1 tablet (10 mg total) by mouth 3 (three) times daily.   Lancets 30G MISC Use to check blood  glucose four times daily as instructed.   loperamide (IMODIUM) 2 MG capsule Take 2-4 mg by mouth See admin instructions. Take 4 mg by mouth now: then 2 mg after each loose stool as needed, do not exceed 5 doses in 24 hours.   losartan  (COZAAR ) 25 MG tablet Take 12.5 mg by mouth daily.   metolazone (ZAROXOLYN) 5 MG tablet Take 5 mg by mouth in the morning.   metoprolol  tartrate (LOPRESSOR ) 50 MG tablet Take 50 mg by mouth 2 (two) times daily.   montelukast  (SINGULAIR ) 10 MG tablet Take 10 mg by mouth at bedtime.   pantoprazole  (PROTONIX ) 40 MG tablet Take 1 tablet (40 mg total) by mouth 2 (two) times daily before a meal.   polyethylene glycol powder (GLYCOLAX /MIRALAX ) 17 GM/SCOOP powder Take 17 g by mouth daily as needed for mild constipation or moderate constipation.   potassium chloride  SA (KLOR-CON  M) 20 MEQ tablet Take 20 mEq by mouth daily.   rosuvastatin  (CRESTOR ) 10 MG tablet Take 10 mg by mouth at bedtime.   simethicone  (SIMETHICONE  DROPS INFANTS) 40 MG/0.6ML drops As directed for colonoscopy instructions   sodium phosphate  (FLEET) ENEM Place 133 mLs (1 enema total) rectally as directed.   TRELEGY ELLIPTA 100-62.5-25 MCG/ACT AEPB Inhale 1 puff into the lungs daily.   triamcinolone cream (KENALOG) 0.5 % Apply 1 application  topically every 12 (twelve) hours as needed (applied to bilateral legs for allergic dermatitis).   [DISCONTINUED] insulin  regular human CONCENTRATED (HUMULIN  R U-500 KWIKPEN) 500 UNIT/ML KwikPen Inject 20-50 Units into the skin 3 (three) times daily with meals. INJECT 50 UNITS SUBCUTANEOUSLY AT BREAKFAST, 20 UNITS AT LUNCH & 40 UNITS AT SUPPER; WHEN GLUCOSE IS ABOVE 90 & EATING.(HOLD IF BS BELOW 70: CALL [name] IF BS ABOVE 400)   No facility-administered encounter medications on file as of 12/14/2023.   ALLERGIES: No Known Allergies VACCINATION STATUS: Immunization History  Administered Date(s) Administered   Hepatitis B, ADULT 07/29/2018, 08/29/2018, 10/07/2018    Influenza Inj Mdck Quad Pf 12/19/2016   Influenza,inj,Quad PF,6+ Mos 02/02/2013, 05/15/2018   Influenza-Unspecified 05/15/2018   PPD Test 07/22/2018   Pneumococcal Polysaccharide-23 05/15/2018   Pneumococcal-Unspecified 05/15/2018    Diabetes She presents for her follow-up diabetic visit. She has type 2 diabetes mellitus.  Onset time: she was diagnosed at approximate age of 76 years. Her disease course has been improving. There are no hypoglycemic associated symptoms. Pertinent negatives for hypoglycemia include no confusion, headaches, pallor or seizures. Pertinent negatives for diabetes include no blurred vision, no chest pain, no fatigue, no polydipsia, no polyphagia and no polyuria. There are no hypoglycemic complications. Symptoms are improving. Diabetic complications include nephropathy. Risk factors for coronary artery disease include diabetes mellitus, dyslipidemia, hypertension, obesity and sedentary lifestyle. Current diabetic treatment includes intensive insulin  program. Her weight is fluctuating minimally. She is following a generally unhealthy diet. She has had a previous visit with a dietitian. She never participates in exercise. Her home blood glucose trend is decreasing steadily. Her breakfast blood glucose range is generally 130-140 mg/dl. Her lunch blood glucose range is generally 130-140 mg/dl. Her dinner blood glucose range is generally 110-130 mg/dl. Her bedtime blood glucose range is generally 140-180 mg/dl. Her overall blood glucose range is 130-140 mg/dl. (She presents with fluctuating glycemic profile: Controlled with near target, tightly before supper no significant hypoglycemia.  Her point-of-care A1c is 5.4% improving.  This may be partly due to her ongoing anemia.   ) An ACE inhibitor/angiotensin II receptor blocker is being taken.  Hypertension This is a chronic problem. The current episode started more than 1 year ago. The problem is uncontrolled. Pertinent negatives include  no blurred vision, chest pain, headaches, palpitations or shortness of breath. Risk factors for coronary artery disease include diabetes mellitus, dyslipidemia, obesity and sedentary lifestyle. Past treatments include ACE inhibitors. Hypertensive end-organ damage includes kidney disease. Identifiable causes of hypertension include chronic renal disease.  Hyperlipidemia This is a chronic problem. The current episode started more than 1 year ago. The problem is uncontrolled. Exacerbating diseases include chronic renal disease, diabetes and obesity. Pertinent negatives include no chest pain, myalgias or shortness of breath. Current antihyperlipidemic treatment includes statins and fibric acid derivatives. Risk factors for coronary artery disease include diabetes mellitus, dyslipidemia, obesity, a sedentary lifestyle and hypertension.    Objective:    BP (!) 106/58   Pulse 60   Ht 5' 2.5 (1.588 m)   Wt 236 lb 3.2 oz (107.1 kg)   BMI 42.51 kg/m   Wt Readings from Last 3 Encounters:  12/14/23 236 lb 3.2 oz (107.1 kg)  11/18/23 234 lb 11.2 oz (106.5 kg)  10/05/23 243 lb 8 oz (110.5 kg)      Results for orders placed or performed in visit on 12/14/23  HgB A1c   Collection Time: 12/14/23 11:07 AM  Result Value Ref Range   Hemoglobin A1C     HbA1c POC (<> result, manual entry)     HbA1c, POC (prediabetic range)     HbA1c, POC (controlled diabetic range) 5.4 0.0 - 7.0 %   Complete Blood Count (Most recent): Lab Results  Component Value Date   WBC 7.3 11/09/2023   HGB 9.8 (L) 11/09/2023   HCT 30.6 (L) 11/09/2023   MCV 95.0 11/09/2023   PLT 278 11/09/2023    Diabetic Labs (most recent): Lab Results  Component Value Date   HGBA1C 5.4 12/14/2023   HGBA1C 6.3 08/12/2023   HGBA1C 5.8 03/04/2023   MICROALBUR 150 04/25/2020   MICROALBUR 23.4 09/12/2019   MICROALBUR 0.2 07/13/2017   Lipid Panel     Component Value Date/Time   CHOL 107 10/06/2022 0802   TRIG 133 10/06/2022 0802    HDL 42 10/06/2022 0802   CHOLHDL 2.5 10/06/2022 0802   CHOLHDL 5.5 (  H) 09/12/2019 0831   VLDL 59 (H) 06/02/2018 0029   LDLCALC 42 10/06/2022 0802   LDLCALC 124 (H) 09/12/2019 0831    Assessment & Plan:   1. Type 2 diabetes mellitus with stage 3-4 chronic kidney disease, with long-term current use of insulin    -Her diabetes is  complicated by CKD, congestive heart failure,  obesity/sedentary life, and patient remains at extremely high risk for more acute and chronic complications of diabetes which include CAD, CVA, CKD, retinopathy, and neuropathy. These are all discussed in detail with the patient.  She presents with fluctuating glycemic profile: Controlled with near target, tightly before supper no significant hypoglycemia.  Her point-of-care A1c is 5.4% improving.  This may be partly due to her ongoing anemia.    - I have re-counseled the patient on diet management and weight loss  by adopting a carbohydrate restricted / protein rich  Diet.   - she acknowledges that there is a room for improvement in her food and drink choices. - Suggestion is made for her to avoid simple carbohydrates  from her diet including Cakes, Sweet Desserts, Ice Cream, Soda (diet and regular), Sweet Tea, Candies, Chips, Cookies, Store Bought Juices, Alcohol  in Excess of  1-2 drinks a day, Artificial Sweeteners,  Coffee Creamer, and Sugar-free Products, Lemonade. This will help patient to have more stable blood glucose profile and potentially avoid unintended weight gain.  - Patient is advised to stick to a routine mealtimes to eat 3 meals  a day and avoid unnecessary snacks ( to snack only to correct hypoglycemia).  - I have approached patient with the following individualized plan to manage diabetes and patient agrees.   - In light of her presentation with tight predinner glycemic profile, she is advised to discontinue lunchtime insulin  U500,  she is advised to continue U500  50 units at breakfast, and 50  units with supper  when glucose is above 90 mg/dl associated with monitoring of blood glucose before meals and at bedtime. - She is advised to skip insulin  if pre-meal blood glucose is below 90 mg/dL or if she is not eating. -She is not a candidate for metformin .   - Patient specific target  for A1c; LDL, HDL, Triglycerides, were discussed in detail.  2) BP/HTN:  - Her blood pressure is controlled to target.  She has urine microalbuminuria .  -She is advised  to continue current medications including lisinopril  20 mg p.o. daily, hydralazine  25 mg p.o. 3 times daily, furosemide  40 mg p.o. Daily.  3) Lipids/HPL: Her recent lipid panel showed significantly controlled LDL at 42.   She is advised to continue Crestor  20 mg p.o. nightly.     Side effects and precautions discussed with her.    She is also on fenofibrate .     4)  Weight/Diet: Her BMI is 42.51-recently losing weight progressively.  She is still a candidate for modest weight loss.   CDE consult in progress, exercise, and carbohydrates information provided.  5) Chronic Care/Health Maintenance:  -Patient is  on ACEI and encouraged to continue to follow up with Ophthalmology, Podiatrist at least yearly or according to recommendations, and advised to stay away from smoking. I have recommended yearly flu vaccine and pneumonia vaccination at least every 5 years;  and  sleep for at least 7 hours a day.    -She is advised to maintain close follow-up with her nephrologist.  - I advised patient to maintain close follow up with [name], Anne Demissie, [name] for  primary care needs.  I spent  26  minutes in the care of the patient today including review of labs from CMP, Lipids, Thyroid  Function, Hematology (current and previous including abstractions from other facilities); face-to-face time discussing  her blood glucose readings/logs, discussing hypoglycemia and hyperglycemia episodes and symptoms, medications doses, her options of short and long  term treatment based on the latest standards of care / guidelines;  discussion about incorporating lifestyle medicine;  and documenting the encounter. Risk reduction counseling performed per USPSTF guidelines to reduce obesity and cardiovascular risk factors.     Please refer to Patient Instructions for Blood Glucose Monitoring and Insulin /Medications Dosing Guide  in media tab for additional information. Please  also refer to  Patient Self Inventory in the Media  tab for reviewed elements of pertinent patient history.  [name] participated in the discussions, expressed understanding, and voiced agreement with the above plans.  All questions were answered to her satisfaction. she is encouraged to contact clinic should she have any questions or concerns prior to her return visit.   Follow up plan: -Return in about 4 months (around 04/14/2024) for Bring Meter/CGM Device/Logs- A1c in Office.  Ranny Earl, [name] Phone: [phone]  Fax: [phone]  This note was partially dictated with voice recognition software. Similar sounding words can be transcribed inadequately or may not  be corrected upon review.  12/14/2023, 12:12 PM

## 2023-12-16 ENCOUNTER — Other Ambulatory Visit: Payer: Self-pay

## 2023-12-16 DIAGNOSIS — E1122 Type 2 diabetes mellitus with diabetic chronic kidney disease: Secondary | ICD-10-CM

## 2023-12-16 MED ORDER — HUMULIN R U-500 KWIKPEN 500 UNIT/ML ~~LOC~~ SOPN: 50.0000 [IU] | PEN_INJECTOR | Freq: Two times a day (BID) | SUBCUTANEOUS | 0 refills | Status: DC

## 2023-12-23 DIAGNOSIS — N184 Chronic kidney disease, stage 4 (severe): Secondary | ICD-10-CM | POA: Diagnosis not present

## 2023-12-23 DIAGNOSIS — I5032 Chronic diastolic (congestive) heart failure: Secondary | ICD-10-CM | POA: Diagnosis not present

## 2024-01-11 DIAGNOSIS — M79675 Pain in left toe(s): Secondary | ICD-10-CM | POA: Diagnosis not present

## 2024-01-11 DIAGNOSIS — B351 Tinea unguium: Secondary | ICD-10-CM | POA: Diagnosis not present

## 2024-01-11 DIAGNOSIS — M79674 Pain in right toe(s): Secondary | ICD-10-CM | POA: Diagnosis not present

## 2024-01-12 ENCOUNTER — Encounter (INDEPENDENT_AMBULATORY_CARE_PROVIDER_SITE_OTHER): Payer: Self-pay | Admitting: Gastroenterology

## 2024-01-13 DIAGNOSIS — E1169 Type 2 diabetes mellitus with other specified complication: Secondary | ICD-10-CM | POA: Diagnosis not present

## 2024-01-13 DIAGNOSIS — H6692 Otitis media, unspecified, left ear: Secondary | ICD-10-CM | POA: Diagnosis not present

## 2024-01-13 DIAGNOSIS — H9222 Otorrhagia, left ear: Secondary | ICD-10-CM | POA: Diagnosis not present

## 2024-01-17 ENCOUNTER — Encounter: Payer: Self-pay | Admitting: Oncology

## 2024-01-28 ENCOUNTER — Ambulatory Visit: Admitting: Nurse Practitioner

## 2024-01-28 DIAGNOSIS — J449 Chronic obstructive pulmonary disease, unspecified: Secondary | ICD-10-CM | POA: Diagnosis not present

## 2024-01-28 DIAGNOSIS — R052 Subacute cough: Secondary | ICD-10-CM | POA: Diagnosis not present

## 2024-01-28 DIAGNOSIS — J0100 Acute maxillary sinusitis, unspecified: Secondary | ICD-10-CM | POA: Diagnosis not present

## 2024-01-28 DIAGNOSIS — E1169 Type 2 diabetes mellitus with other specified complication: Secondary | ICD-10-CM | POA: Diagnosis not present

## 2024-01-31 ENCOUNTER — Other Ambulatory Visit: Payer: Self-pay | Admitting: Gastroenterology

## 2024-02-08 ENCOUNTER — Other Ambulatory Visit: Payer: Self-pay | Admitting: "Endocrinology

## 2024-02-08 ENCOUNTER — Other Ambulatory Visit (HOSPITAL_COMMUNITY): Payer: Self-pay | Admitting: Internal Medicine

## 2024-02-08 DIAGNOSIS — E1169 Type 2 diabetes mellitus with other specified complication: Secondary | ICD-10-CM | POA: Diagnosis not present

## 2024-02-08 DIAGNOSIS — M170 Bilateral primary osteoarthritis of knee: Secondary | ICD-10-CM | POA: Diagnosis not present

## 2024-02-08 DIAGNOSIS — Z0001 Encounter for general adult medical examination with abnormal findings: Secondary | ICD-10-CM | POA: Diagnosis not present

## 2024-02-08 DIAGNOSIS — I4821 Permanent atrial fibrillation: Secondary | ICD-10-CM | POA: Diagnosis not present

## 2024-02-08 DIAGNOSIS — Z1389 Encounter for screening for other disorder: Secondary | ICD-10-CM | POA: Diagnosis not present

## 2024-02-08 DIAGNOSIS — Z1331 Encounter for screening for depression: Secondary | ICD-10-CM | POA: Diagnosis not present

## 2024-02-08 DIAGNOSIS — J449 Chronic obstructive pulmonary disease, unspecified: Secondary | ICD-10-CM | POA: Diagnosis not present

## 2024-02-08 DIAGNOSIS — Z1231 Encounter for screening mammogram for malignant neoplasm of breast: Secondary | ICD-10-CM

## 2024-02-08 DIAGNOSIS — I5032 Chronic diastolic (congestive) heart failure: Secondary | ICD-10-CM | POA: Diagnosis not present

## 2024-02-08 DIAGNOSIS — I110 Hypertensive heart disease with heart failure: Secondary | ICD-10-CM | POA: Diagnosis not present

## 2024-02-15 ENCOUNTER — Ambulatory Visit: Attending: Cardiology | Admitting: Cardiology

## 2024-02-15 ENCOUNTER — Encounter: Payer: Self-pay | Admitting: Cardiology

## 2024-02-15 VITALS — BP 104/60 | HR 60 | Ht 63.0 in | Wt 230.4 lb

## 2024-02-15 DIAGNOSIS — I5032 Chronic diastolic (congestive) heart failure: Secondary | ICD-10-CM | POA: Diagnosis not present

## 2024-02-15 DIAGNOSIS — N1832 Chronic kidney disease, stage 3b: Secondary | ICD-10-CM | POA: Diagnosis not present

## 2024-02-15 DIAGNOSIS — I480 Paroxysmal atrial fibrillation: Secondary | ICD-10-CM | POA: Insufficient documentation

## 2024-02-15 NOTE — Progress Notes (Signed)
    Cardiology Office Note  Date: 02/15/2024   ID: RAFAELA DINIUS, DOB February 19, 1956, MRN [id number]  History of Present Illness: CHRYSTA FULCHER is a 68 y.o. female last seen in April by Ms. Miriam NP, I reviewed her note.  She is here today for a follow-up visit, continues to reside at [facility].  She does not report any increasing shortness of breath with low-level activity, no angina.  Does report intermittent palpitations that occur every few months.  No syncope.  Her weight is down about 5 pounds over the last 6 months.  I reviewed her medications.  No changes from a cardiac perspective.  She does not report any spontaneous bleeding problems on Eliquis .  I went over her most recent lab work.  We discussed getting a follow-up echocardiogram to reassess degree of valvular calcification and LVEF.  Physical Exam: VS:  BP 104/60 (BP Location: Left Arm, Patient Position: Sitting)   Pulse 60   Ht 5' 3 (1.6 m)   Wt 230 lb 6.4 oz (104.5 kg)   SpO2 99%   BMI 40.81 kg/m , BMI Body mass index is 40.81 kg/m.  Wt Readings from Last 3 Encounters:  02/15/24 230 lb 6.4 oz (104.5 kg)  12/14/23 236 lb 3.2 oz (107.1 kg)  11/18/23 234 lb 11.2 oz (106.5 kg)    General: Patient appears comfortable at rest. HEENT: Conjunctiva and lids normal. Neck: Supple, no elevated JVP or carotid bruits. Lungs: Clear to auscultation, nonlabored breathing at rest. Cardiac: Irregularly irregular, no S3, 2/6 systolic murmur. Extremities: No pitting edema.  ECG:  An ECG dated 04/23/2023 was personally reviewed today and demonstrated:  Atrial fibrillation at 60 bpm, nonspecific ST changes.  Labwork: 11/09/2023: ALT 10; AST 15; BUN 50; Creatinine, Ser 1.88; Hemoglobin 9.8; Platelets 278; Potassium 3.6; Sodium 133     Component Value Date/Time   CHOL 107 10/06/2022 0802   TRIG 133 10/06/2022 0802   HDL 42 10/06/2022 0802   CHOLHDL 2.5 10/06/2022 0802   CHOLHDL 5.5 (H) 09/12/2019 0831   VLDL 59 (H) 06/02/2018 0029    LDLCALC 42 10/06/2022 0802   LDLCALC 124 (H) 09/12/2019 0831   Other Studies Reviewed Today:  No interval cardiac testing for review today.  Assessment and Plan:  1.  Paroxysmal to persistent atrial fibrillation with CHA2DS2-VASc score of 4.  Symptomatically stable with intermittent palpitations as before, no syncope.  Continue Eliquis  5 mg twice daily for stroke prophylaxis.  She does not report any spontaneous bleeding problems or stool changes.  I reviewed her interval lab work.  Continue Cardizem  CD 240 mg daily and Lopressor  50 mg twice daily.   2.  HFpEF, LVEF 60 to 65% by echocardiogram in 2020.  Plan to update echocardiogram, also with heart murmur and previous documented valvular calcification.  She is currently on Kerendia  10 mg daily, Lasix  20 mg alternating with 40 mg every other day, potassium supplement, and Zaroxolyn 5 mg daily.   3.  CKD stage IIIb-IV.  Creatinine 1.88 with GFR 29.   4.  History of bronchopulmonary carcinoid tumor s/p resection, continues to follow with Oncology.  5.  Mixed hyperlipidemia.  Currently on Crestor  10 mg daily, LDL 42 in July 2024.  Disposition:  Follow up 6 months.  Signed, Jayson JUDITHANN Sierras, M.D., F.A.C.C. [facility]

## 2024-02-15 NOTE — Patient Instructions (Addendum)

## 2024-02-16 ENCOUNTER — Other Ambulatory Visit: Payer: Self-pay | Admitting: "Endocrinology

## 2024-02-16 ENCOUNTER — Other Ambulatory Visit (HOSPITAL_COMMUNITY): Payer: Self-pay

## 2024-02-16 ENCOUNTER — Other Ambulatory Visit: Payer: Self-pay | Admitting: Oncology

## 2024-02-16 ENCOUNTER — Ambulatory Visit (HOSPITAL_COMMUNITY)
Admission: RE | Admit: 2024-02-16 | Discharge: 2024-02-16 | Disposition: A | Discharge status: Home / Self Care | Source: Ambulatory Visit | Attending: Oncology | Admitting: Oncology

## 2024-02-16 ENCOUNTER — Inpatient Hospital Stay: Attending: Oncology | Admitting: Oncology

## 2024-02-16 ENCOUNTER — Encounter (HOSPITAL_COMMUNITY): Payer: Self-pay

## 2024-02-16 DIAGNOSIS — D3A090 Benign carcinoid tumor of the bronchus and lung: Secondary | ICD-10-CM | POA: Insufficient documentation

## 2024-02-16 DIAGNOSIS — N186 End stage renal disease: Secondary | ICD-10-CM | POA: Diagnosis not present

## 2024-02-16 DIAGNOSIS — E278 Other specified disorders of adrenal gland: Secondary | ICD-10-CM | POA: Insufficient documentation

## 2024-02-16 DIAGNOSIS — I700 Atherosclerosis of aorta: Secondary | ICD-10-CM | POA: Diagnosis not present

## 2024-02-16 DIAGNOSIS — C7A090 Malignant carcinoid tumor of the bronchus and lung: Secondary | ICD-10-CM | POA: Diagnosis not present

## 2024-02-16 DIAGNOSIS — N185 Chronic kidney disease, stage 5: Secondary | ICD-10-CM

## 2024-02-16 DIAGNOSIS — Z7901 Long term (current) use of anticoagulants: Secondary | ICD-10-CM | POA: Insufficient documentation

## 2024-02-16 DIAGNOSIS — I4891 Unspecified atrial fibrillation: Secondary | ICD-10-CM | POA: Insufficient documentation

## 2024-02-16 DIAGNOSIS — D631 Anemia in chronic kidney disease: Secondary | ICD-10-CM | POA: Insufficient documentation

## 2024-02-16 DIAGNOSIS — E538 Deficiency of other specified B group vitamins: Secondary | ICD-10-CM | POA: Insufficient documentation

## 2024-02-16 DIAGNOSIS — D500 Iron deficiency anemia secondary to blood loss (chronic): Secondary | ICD-10-CM

## 2024-02-16 DIAGNOSIS — E1122 Type 2 diabetes mellitus with diabetic chronic kidney disease: Secondary | ICD-10-CM

## 2024-02-16 DIAGNOSIS — R918 Other nonspecific abnormal finding of lung field: Secondary | ICD-10-CM | POA: Diagnosis not present

## 2024-02-16 LAB — CBC WITH DIFFERENTIAL/PLATELET
Abs Immature Granulocytes: 0.02 K/uL (ref 0.00–0.07)
Basophils Absolute: 0 K/uL (ref 0.0–0.1)
Basophils Relative: 1 %
Eosinophils Absolute: 0.2 K/uL (ref 0.0–0.5)
Eosinophils Relative: 2 %
HCT: 21.5 % — ABNORMAL LOW (ref 36.0–46.0)
Hemoglobin: 6.5 g/dL — CL (ref 12.0–15.0)
Immature Granulocytes: 0 %
Lymphocytes Relative: 11 %
Lymphs Abs: 0.8 K/uL (ref 0.7–4.0)
MCH: 30.4 pg (ref 26.0–34.0)
MCHC: 30.2 g/dL (ref 30.0–36.0)
MCV: 100.5 fL — ABNORMAL HIGH (ref 80.0–100.0)
Monocytes Absolute: 0.9 K/uL (ref 0.1–1.0)
Monocytes Relative: 12 %
Neutro Abs: 5.6 K/uL (ref 1.7–7.7)
Neutrophils Relative %: 74 %
Platelets: 295 K/uL (ref 150–400)
RBC: 2.14 MIL/uL — ABNORMAL LOW (ref 3.87–5.11)
RDW: 17.3 % — ABNORMAL HIGH (ref 11.5–15.5)
WBC: 7.6 K/uL (ref 4.0–10.5)
nRBC: 0 % (ref 0.0–0.2)

## 2024-02-16 LAB — FERRITIN: Ferritin: 92 ng/mL (ref 11–307)

## 2024-02-16 LAB — COMPREHENSIVE METABOLIC PANEL WITH GFR
ALT: 8 U/L (ref 0–44)
AST: 16 U/L (ref 15–41)
Albumin: 4 g/dL (ref 3.5–5.0)
Alkaline Phosphatase: 74 U/L (ref 38–126)
Anion gap: 12 (ref 5–15)
BUN: 52 mg/dL — ABNORMAL HIGH (ref 8–23)
CO2: 27 mmol/L (ref 22–32)
Calcium: 9.6 mg/dL (ref 8.9–10.3)
Chloride: 98 mmol/L (ref 98–111)
Creatinine, Ser: 2.36 mg/dL — ABNORMAL HIGH (ref 0.44–1.00)
GFR, Estimated: 22 mL/min — ABNORMAL LOW (ref >60)
Glucose, Bld: 97 mg/dL (ref 70–99)
Potassium: 4.4 mmol/L (ref 3.5–5.1)
Sodium: 137 mmol/L (ref 135–145)
Total Bilirubin: 0.4 mg/dL (ref 0.0–1.2)
Total Protein: 7.2 g/dL (ref 6.5–8.1)

## 2024-02-16 LAB — IRON AND TIBC
Iron: 32 ug/dL (ref 28–170)
Saturation Ratios: 9 % — ABNORMAL LOW (ref 10.4–31.8)
TIBC: 347 ug/dL (ref 250–450)
UIBC: 316 ug/dL

## 2024-02-16 LAB — FOLATE: Folate: >20 ng/mL (ref >5.9)

## 2024-02-16 LAB — VITAMIN B12: Vitamin B-12: 2423 pg/mL — ABNORMAL HIGH (ref 180–914)

## 2024-02-16 NOTE — Progress Notes (Signed)
 CRITICAL VALUE ALERT Critical value received:  HGB 6.5.  Date of notification:  02-16-2024 Time of notification: 13;26 pm. Critical value read back:  Yes.   Nurse who received alert:  B.Haroun Cotham RN.  MD notified time and response:  J.Burns NP @ 13:40 pm. Patient will receive 2 units of blood Friday February 18, 2024.

## 2024-02-17 ENCOUNTER — Inpatient Hospital Stay (HOSPITAL_BASED_OUTPATIENT_CLINIC_OR_DEPARTMENT_OTHER): Admitting: Oncology

## 2024-02-17 ENCOUNTER — Ambulatory Visit: Payer: Self-pay | Admitting: Oncology

## 2024-02-17 DIAGNOSIS — N186 End stage renal disease: Secondary | ICD-10-CM | POA: Diagnosis not present

## 2024-02-17 DIAGNOSIS — D631 Anemia in chronic kidney disease: Secondary | ICD-10-CM | POA: Diagnosis not present

## 2024-02-17 DIAGNOSIS — I4891 Unspecified atrial fibrillation: Secondary | ICD-10-CM | POA: Diagnosis not present

## 2024-02-17 DIAGNOSIS — E538 Deficiency of other specified B group vitamins: Secondary | ICD-10-CM | POA: Diagnosis not present

## 2024-02-17 DIAGNOSIS — D3A090 Benign carcinoid tumor of the bronchus and lung: Secondary | ICD-10-CM | POA: Diagnosis not present

## 2024-02-17 DIAGNOSIS — D500 Iron deficiency anemia secondary to blood loss (chronic): Secondary | ICD-10-CM

## 2024-02-17 DIAGNOSIS — C7A090 Malignant carcinoid tumor of the bronchus and lung: Secondary | ICD-10-CM | POA: Diagnosis not present

## 2024-02-17 LAB — PREPARE RBC (CROSSMATCH)

## 2024-02-17 NOTE — Progress Notes (Signed)
 Yes she is getting blood today or tomorrow.  Thank you.  Delon Hope, NP 02/17/2024 7:55 AM

## 2024-02-18 ENCOUNTER — Inpatient Hospital Stay

## 2024-02-18 VITALS — BP 96/68 | HR 62 | Temp 96.5°F | Resp 18

## 2024-02-18 DIAGNOSIS — D3A090 Benign carcinoid tumor of the bronchus and lung: Secondary | ICD-10-CM | POA: Diagnosis not present

## 2024-02-18 DIAGNOSIS — I4891 Unspecified atrial fibrillation: Secondary | ICD-10-CM | POA: Diagnosis not present

## 2024-02-18 DIAGNOSIS — D500 Iron deficiency anemia secondary to blood loss (chronic): Secondary | ICD-10-CM

## 2024-02-18 DIAGNOSIS — C7A090 Malignant carcinoid tumor of the bronchus and lung: Secondary | ICD-10-CM | POA: Diagnosis not present

## 2024-02-18 DIAGNOSIS — E538 Deficiency of other specified B group vitamins: Secondary | ICD-10-CM | POA: Diagnosis not present

## 2024-02-18 DIAGNOSIS — N186 End stage renal disease: Secondary | ICD-10-CM | POA: Diagnosis not present

## 2024-02-18 DIAGNOSIS — D631 Anemia in chronic kidney disease: Secondary | ICD-10-CM | POA: Diagnosis not present

## 2024-02-18 MED ORDER — ACETAMINOPHEN 325 MG PO TABS
650.0000 mg | ORAL_TABLET | Freq: Once | ORAL | Status: AC
  Administered 2024-02-18: 650 mg via ORAL
  Filled 2024-02-18: qty 2

## 2024-02-18 MED ORDER — SODIUM CHLORIDE 0.9 % IV SOLN
1000.0000 mg | Freq: Once | INTRAVENOUS | Status: AC
  Administered 2024-02-18: 1000 mg via INTRAVENOUS
  Filled 2024-02-18: qty 10

## 2024-02-18 MED ORDER — SODIUM CHLORIDE 0.9% IV SOLUTION
250.0000 mL | INTRAVENOUS | Status: DC
  Administered 2024-02-18: 100 mL via INTRAVENOUS

## 2024-02-18 MED ORDER — CETIRIZINE HCL 10 MG/ML IV SOLN: 10.0000 mg | Freq: Once | INTRAVENOUS | Status: DC

## 2024-02-18 MED ORDER — SODIUM CHLORIDE 0.9% FLUSH: 10.0000 mL | Freq: Two times a day (BID) | INTRAVENOUS | Status: DC

## 2024-02-18 MED ORDER — DIPHENHYDRAMINE HCL 25 MG PO CAPS
25.0000 mg | ORAL_CAPSULE | Freq: Once | ORAL | Status: AC
  Administered 2024-02-18: 25 mg via ORAL
  Filled 2024-02-18: qty 1

## 2024-02-18 MED ORDER — SODIUM CHLORIDE 0.9 % IV SOLN: Freq: Once | INTRAVENOUS | Status: AC

## 2024-02-18 NOTE — Patient Instructions (Addendum)
 [facility] - A DEPT OF [place]  Discharge Instructions: Thank you for choosing [facility] to provide your oncology and hematology care.  If you have a lab appointment with the Cancer Center - please note that after April 8th, 2024, all labs will be drawn in the cancer center.  You do not have to check in or register with the main entrance as you have in the past but will complete your check-in in the cancer center.  Wear comfortable clothing and clothing appropriate for easy access to any Portacath or PICC line.   We strive to give you quality time with your provider. You may need to reschedule your appointment if you arrive late (15 or more minutes).  Arriving late affects you and other patients whose appointments are after yours.  Also, if you miss three or more appointments without notifying the office, you may be dismissed from the clinic at the provider's discretion.      For prescription refill requests, have your pharmacy contact our office and allow 72 hours for refills to be completed.    Today you received the following chemotherapy and/or immunotherapy agents [id number] Monoferric       To help prevent nausea and vomiting after your treatment, we encourage you to take your nausea medication as directed.  BELOW ARE SYMPTOMS THAT SHOULD BE REPORTED IMMEDIATELY: *FEVER GREATER THAN 100.4 F (38 C) OR HIGHER *CHILLS OR SWEATING *NAUSEA AND VOMITING THAT IS NOT CONTROLLED WITH YOUR NAUSEA MEDICATION *UNUSUAL SHORTNESS OF BREATH *UNUSUAL BRUISING OR BLEEDING *URINARY PROBLEMS (pain or burning when urinating, or frequent urination) *BOWEL PROBLEMS (unusual diarrhea, constipation, pain near the anus) TENDERNESS IN MOUTH AND THROAT WITH OR WITHOUT PRESENCE OF ULCERS (sore throat, sores in mouth, or a toothache) UNUSUAL RASH, SWELLING OR PAIN  UNUSUAL VAGINAL DISCHARGE OR ITCHING   Items with * indicate a potential emergency and should be  followed up as soon as possible or go to the Emergency Department if any problems should occur.  Please show the CHEMOTHERAPY ALERT CARD or IMMUNOTHERAPY ALERT CARD at check-in to the Emergency Department and triage nurse.  Should you have questions after your visit or need to cancel or reschedule your appointment, please contact [facility] - A DEPT OF [facility] [phone]  and follow the prompts.  Office hours are 8:00 a.m. to 4:30 p.m. Monday - Friday. Please note that voicemails left after 4:00 p.m. may not be returned until the following business day.  We are closed weekends and major holidays. You have access to a nurse at all times for urgent questions. Please call the main number to the clinic [phone] and follow the prompts.  For any non-urgent questions, you may also contact your provider using MyChart. We now offer e-Visits for anyone 17 and older to request care online for non-urgent symptoms. For details visit [email or web address].   Also download the MyChart app! Go to the app store, search MyChart, open the app, select [facility], and log in with your MyChart username and password.

## 2024-02-18 NOTE — Progress Notes (Signed)
 Stable during transfusions without adverse affects.  Vital signs stable.  No complaints at this time.  Discharge from clinic via wheelchair in stable condition.  Alert and oriented X 3.  Follow up with [facility] as scheduled.

## 2024-02-19 LAB — TYPE AND SCREEN
ABO/RH(D): A POS
Antibody Screen: NEGATIVE
Unit division: 0
Unit division: 0

## 2024-02-19 LAB — BPAM RBC
Blood Product Expiration Date: 202511282359
Blood Product Expiration Date: 202511292359
ISSUE DATE / TIME: 202511211119
ISSUE DATE / TIME: 202511211250
Unit Type and Rh: 6200
Unit Type and Rh: 6200

## 2024-02-23 ENCOUNTER — Inpatient Hospital Stay

## 2024-02-23 ENCOUNTER — Other Ambulatory Visit: Payer: Self-pay

## 2024-02-23 ENCOUNTER — Inpatient Hospital Stay: Admitting: Physician Assistant

## 2024-02-23 ENCOUNTER — Other Ambulatory Visit: Payer: Self-pay | Admitting: Physician Assistant

## 2024-02-23 VITALS — BP 110/57 | HR 50 | Temp 97.8°F | Resp 17

## 2024-02-23 VITALS — BP 92/50 | HR 70 | Temp 98.1°F | Resp 17 | Ht 62.0 in | Wt 231.0 lb

## 2024-02-23 DIAGNOSIS — N184 Chronic kidney disease, stage 4 (severe): Secondary | ICD-10-CM | POA: Diagnosis not present

## 2024-02-23 DIAGNOSIS — C7A090 Malignant carcinoid tumor of the bronchus and lung: Secondary | ICD-10-CM | POA: Diagnosis not present

## 2024-02-23 DIAGNOSIS — D500 Iron deficiency anemia secondary to blood loss (chronic): Secondary | ICD-10-CM

## 2024-02-23 DIAGNOSIS — D631 Anemia in chronic kidney disease: Secondary | ICD-10-CM

## 2024-02-23 DIAGNOSIS — E538 Deficiency of other specified B group vitamins: Secondary | ICD-10-CM

## 2024-02-23 DIAGNOSIS — D3A090 Benign carcinoid tumor of the bronchus and lung: Secondary | ICD-10-CM

## 2024-02-23 DIAGNOSIS — I4891 Unspecified atrial fibrillation: Secondary | ICD-10-CM | POA: Diagnosis not present

## 2024-02-23 DIAGNOSIS — N186 End stage renal disease: Secondary | ICD-10-CM | POA: Diagnosis not present

## 2024-02-23 DIAGNOSIS — M79671 Pain in right foot: Secondary | ICD-10-CM | POA: Diagnosis not present

## 2024-02-23 LAB — CBC
HCT: 25.8 % — ABNORMAL LOW (ref 36.0–46.0)
Hemoglobin: 7.8 g/dL — ABNORMAL LOW (ref 12.0–15.0)
MCH: 30 pg (ref 26.0–34.0)
MCHC: 30.2 g/dL (ref 30.0–36.0)
MCV: 99.2 fL (ref 80.0–100.0)
Platelets: 229 K/uL (ref 150–400)
RBC: 2.6 MIL/uL — ABNORMAL LOW (ref 3.87–5.11)
RDW: 17.3 % — ABNORMAL HIGH (ref 11.5–15.5)
WBC: 7.1 K/uL (ref 4.0–10.5)
nRBC: 0 % (ref 0.0–0.2)

## 2024-02-23 LAB — PREPARE RBC (CROSSMATCH)

## 2024-02-23 LAB — SAMPLE TO BLOOD BANK

## 2024-02-23 MED ORDER — ACETAMINOPHEN 325 MG PO TABS
650.0000 mg | ORAL_TABLET | Freq: Once | ORAL | Status: AC
  Administered 2024-02-23: 650 mg via ORAL
  Filled 2024-02-23: qty 2

## 2024-02-23 MED ORDER — VITAMIN B-12 1000 MCG PO TABS: 1000.0000 ug | ORAL_TABLET | ORAL | 1 refills | Status: AC

## 2024-02-23 MED ORDER — DIPHENHYDRAMINE HCL 25 MG PO CAPS
25.0000 mg | ORAL_CAPSULE | Freq: Once | ORAL | Status: AC
  Administered 2024-02-23: 25 mg via ORAL
  Filled 2024-02-23: qty 1

## 2024-02-23 MED ORDER — SODIUM CHLORIDE 0.9% IV SOLUTION
250.0000 mL | INTRAVENOUS | Status: DC
  Administered 2024-02-23: 250 mL via INTRAVENOUS

## 2024-02-23 NOTE — Progress Notes (Signed)
 [facility] [address], [name]   CLINIC:  Medical Oncology/Hematology  PCP:  [name], [name]   REASON FOR VISIT:  Follow-up for anemia (GI blood loss + CKD stage IV) + carcinoid tumor of the lung (surveillance)  CURRENT THERAPY: Intermittent IV iron  and blood transfusions  INTERVAL HISTORY:   [name] 68 y.o. female returns for routine follow-up of anemia related to CKD stage V and iron  deficiency. She was last seen by Dr. Davonna on 11/18/2023. She is accompanied today by her [employer]/legal guardian, [name].  At today's visit, she reports feeling fairly well.  She  reports 80% energy and 50% appetite.   She  is maintaining stable weight at this time.  She reports wheezing when she is sick, but denies any facial flushing.  She does have diarrhea from time to time. She reports dyspnea on exertion.  She has intermittent cough.  No hemoptysis or chest pain.  Patient reports melanotic stool, but is unable to give timeline for when this occurred.  She denies any gross hematochezia. Denies any headaches, lightheadedness, or syncope.  ASSESSMENT & PLAN:  1.  Iron  deficiency anemia due to chronic blood loss # Anemia due to CKD stage IV - Chronic multifactorial anemia related to advanced CKD and intermittent blood loss from GAVE - She follows with Dr. Rachele for her CKD stage V.  (She was previously on dialysis and receiving EPO injections).  Erythropoietin  level from 06/28/2023 was mildly elevated at 38.5. - Multiple myeloma workup: SPEP: No M spike, IFE: Normal. Slightly elevated free light chains with normal ratio  - Most recent EGD (10/06/2023): Portal hypertensive gastropathy.  GAVE with active oozing.  Treated with APC. - Colonoscopy (10/06/2023): Polyps x 2 (tubular adenoma) - Patient has required intermittent blood transfusions.  Most recently received PRBC x 2 on 02/18/2024  (Hgb 6.5 on 02/16/2024) - Received intermittent IV iron , most recently with Monoferric  x 1 g on 11/26/2023 and again on 02/18/2024  - She is taking iron  pill every other day - Reports melanotic stool - Most recent labs (02/16/2024): Ferritin 92, iron  saturation 9%.  Hgb 6.5.  (Received 2 units PRBC and Monoferric  on 02/17/2024).  Creatinine 2.36/GFR 22. - CBC today (02/23/2024): Hgb 7.8 - PLAN: Transfuse PRBC x 1 today. - Patient has already received her Monoferric  1000 mg in 02/18/2024, in addition to 2 units PRBC last week.  Per Dr. Davonna, IV iron  as needed to keep TSAT of 25-30, as long as ferritin is <200 - Due to high risk for recurrent bleeding, recommend CBC/BB sample weekly  - Since her baseline hemoglobin in between bleeding episodes and still <10.0, we will start her on ESA therapy with Retacrit  10,000 units weekly   - Notified gastroenterologist regarding melanotic stool and acute drop in hemoglobin.  They will try to bring patient in for visit next week, as she likely needs repeat EGD sooner rather than later.  - Continue iron  pills every other day - RTC in 6-8 weeks with labs obtained 1 week prior  2.  Bronchopulmonary carcinoid tumor - CT chest performed during hospitalization in February 2020 showed masslike consolidation measuring 5 x 8.2 cm over left lower lobe - Endobronchial biopsy of left lower lobe mass (05/26/2018): Typical carcinoid tumor, stain positive for synaptophysin and chromogranin.  Atypical squamous metaplasia (favor reactive).  No mitotic activity or necrosis identified.  Ki-67 shows proliferative index of <1%. - Evaluated  by cardiothoracic surgery (Dr. Shyrl) in November 2022, and felt to be poor candidate for surgical resection.  Since tumor has been stable and asymptomatic, active surveillance with oncology and pulmonology was recommended instead.   - Radiation therapy not felt to be helpful with this tumor type, per tumor board discussion in 2022. - NCCN  Guidelines for Locoregional Unresectable Neuroendocrine Tumor of the Lung (low-grade, typical carcinoid) Observation if asymptomatic For symptom and/or tumor control, possible treatment options include everolimus, octreotide (if SSTR positive and/or hormonal symptoms), or temozolomide +/- capecitabine - Tumor thought to be causing some left lower lobe airway obstruction with some postobstructive atelectasis of anterior left lobe  - She has been undergoing active surveillance since time of diagnosis with annual CT chest  - Chromogranin A last checked 07/31/2022, elevated at 807.5.  (24-hour urine 5HIAA was ordered, but never returned by patient.) NOTE that she is also on pantoprazole  twice daily since at least 2023, which can also cause some elevation in chromogranin A - Most recent CT chest (02/16/2024): Partially endobronchial left lower lobe nodule measures 1.2 x 1.4 cm, similar on prior exam. Anterior left lower lobe collapse/consolidation secondary to endobronchial obstruction is minimally increased Left lower lobe 1 mm calcified granuloma Right lower lobe subpleural 1 mm pulmonary nodule (not readily apparent on prior) - attention recommended at follow-up No thoracic adenopathy or evidence of metastatic disease - No major/persistent symptoms of carcinoid syndrome - she reports wheezing when she is sick, but denies any facial flushing.  She does have diarrhea from time to time. - Reports dyspnea on exertion.  She has intermittent cough.  No hemoptysis or chest pain. - PLAN: Discussed with supervising Anne (Dr. Davonna) - will continue annual CT chest for surveillance (next due November 2026). - We will check chromogranin A and 24-hour urine 5-HIAA prior to next visit.    - If patient has any worsening pulmonary symptoms or symptoms of carcinoid syndrome, would consider discussion regarding treatment options versus palliative care/hospice. - Patient last seen by pulmonology (Dr. Brenna) in 2022,  prior to patient being referred to cardiothoracic surgery.  Per note by CT surgeon, patient was recommended to continue follow-up with pulmonology, but I wonder if this may have fallen through the cracks.  I will reach out to [facility] (Dr. Brenna no longer there) to see if he would like to continue following this patient.   3.  Vitamin B12 deficiency - She is taking vitamin B12 1000 mcg daily - Most recent labs show elevated vitamin B12 at 2423.  Folate normal. - PLAN: Recommend to DECREASE vitamin B12 to take EVERY OTHER DAY.  Recheck at follow-up.  4.  Atrial fibrillation, on Eliquis  - She has history of atrial fibrillation, on Eliquis  - PLAN: Recommend the patient discussed with GI and cardiology whether or not to remain on Eliquis    5.  Right adrenal nodule, adenoma - Most recent CT chest with some visualization of upper abdomen shows right adrenal mass measuring 3.3 x 2.5 cm and 14 HU, present and stable since at least 05/21/2016.  Consistent with benign adenoma, no specific follow-up indicated.  6.  Other history - Patient with mental disability and long-term care facility.  She has a legal guardian with [employer]. - Other PMH includes asthma, hypertension, diabetes, CKD stage IV/V    PLAN SUMMARY: >> PRBC x 1 today >> Starting this Monday 02/28/24 - CBC/BBS + Retacrit  every week >> Send patient home with 24-hour urine kit for 5HIAA on 03/13/2024 >>  Labs in 6 weeks = CBC/D, CMP, ferritin, iron /TIBC, B12, MMA, chromogranin A + BB sample >> OFFICE visit in 8 weeks (with same-day CBC + Retacrit ) ** CC chart sent to Dr. Darlean to inquire regarding pulmonary follow-up     REVIEW OF SYSTEMS:   Review of Systems  Constitutional:  Negative for appetite change, chills, diaphoresis, fatigue, fever and unexpected weight change.  HENT:   Negative for lump/mass and nosebleeds.   Eyes:  Negative for eye problems.  Respiratory:  Positive for cough (at times), shortness of breath (with  exertion) and wheezing (at times). Negative for hemoptysis.   Cardiovascular:  Negative for chest pain, leg swelling and palpitations.  Gastrointestinal:  Positive for diarrhea. Negative for abdominal pain, blood in stool, constipation, nausea and vomiting.  Genitourinary:  Negative for hematuria.   Skin: Negative.   Neurological:  Negative for dizziness, headaches and light-headedness.  Hematological:  Does not bruise/bleed easily.     PHYSICAL EXAM:  ECOG PERFORMANCE STATUS: 2 - Symptomatic, <50% confined to bed  Vitals:   02/23/24 1016  BP: (!) 92/50  Pulse: 70  Resp: 17  Temp: 98.1 F (36.7 C)  SpO2: 100%   Filed Weights   02/23/24 1016  Weight: 231 lb (104.8 kg)   Physical Exam Constitutional:      Appearance: Normal appearance. She is morbidly obese.  Cardiovascular:     Heart sounds: Normal heart sounds.  Pulmonary:     Breath sounds: Normal breath sounds.  Neurological:     General: No focal deficit present.     Mental Status: Mental status is at baseline.  Psychiatric:        Behavior: Behavior normal. Behavior is cooperative.     PAST MEDICAL/SURGICAL HISTORY:  Past Medical History:  Diagnosis Date   Arthritis    Asthma    Atrial fibrillation (HCC)    Carcinoid tumor determined by biopsy of lung (HCC) 05/2018   Left lung   Chronic diastolic CHF (congestive heart failure) (HCC)    CKD (chronic kidney disease), stage III (HCC)    Diabetes mellitus type 2 in obese    Essential hypertension    GERD (gastroesophageal reflux disease)    Heart murmur    Iron  deficiency anemia 10/16/2010   Mental handicap 10/16/2010   Mild CAD 2013   Morbid obesity (HCC)    OSA (obstructive sleep apnea)    Past Surgical History:  Procedure Laterality Date   ABDOMINAL HYSTERECTOMY     AV FISTULA PLACEMENT Right 06/06/2018   Procedure: ARTERIOVENOUS (AV) FISTULA CREATION;  Surgeon: Gretta Lonni PARAS, Anne;  Location: [facility] OR;  Service: Vascular;  Laterality: Right;    BIOPSY  10/16/2021   Procedure: BIOPSY;  Surgeon: Shaaron Lamar HERO, Anne;  [facility];  Service: Endoscopy;;   BRONCHIAL DILITATION  12/13/2020   Procedure: BRONCHIAL DILITATION;  Surgeon: Brenna Adine CROME, DO;  Location: [facility];  Service: Pulmonary;;   CESAREAN SECTION     CHOLECYSTECTOMY     COLONOSCOPY  08/2010   normal TI, sigmoid polyp (adenoma ). Next TCS due  08/2015,   COLONOSCOPY N/A 10/06/2023   Procedure: COLONOSCOPY;  Surgeon: Shaaron Lamar HERO, Anne;  Location: [facility];  Service: Endoscopy;  Laterality: N/A;  1:00pm, asa 3,a t [place]   COLONOSCOPY WITH PROPOFOL  N/A 07/08/2020   Procedure: COLONOSCOPY WITH PROPOFOL ;  Surgeon: Shaaron Lamar HERO, Anne;  [facility]: [facility];  Service: Endoscopy;  Laterality: N/A;  AM (diabetic and facility  patient)   ESOPHAGOGASTRODUODENOSCOPY  08/2010   antral and duodenal erosions s/p bx (chronic gastritis, no h.pylori, no celiac dz ), hiatal hernia   ESOPHAGOGASTRODUODENOSCOPY N/A 10/06/2023   Procedure: EGD (ESOPHAGOGASTRODUODENOSCOPY);  Surgeon: Shaaron Lamar HERO, Anne;  [facility];  Service: Endoscopy;  Laterality: N/A;   ESOPHAGOGASTRODUODENOSCOPY (EGD) WITH PROPOFOL  N/A 10/16/2021   Procedure: ESOPHAGOGASTRODUODENOSCOPY (EGD) WITH PROPOFOL ;  Surgeon: Shaaron Lamar HERO, Anne;  [facility];  Service: Endoscopy;  Laterality: N/A;  11:45am   ESOPHAGOGASTRODUODENOSCOPY (EGD) WITH PROPOFOL  N/A 12/18/2021   Procedure: ESOPHAGOGASTRODUODENOSCOPY (EGD) WITH PROPOFOL ;  Surgeon: Cindie Carlin POUR, DO;  [facility];  Service: Endoscopy;  Laterality: N/A;   ESOPHAGOGASTRODUODENOSCOPY (EGD) WITH PROPOFOL  N/A 01/19/2023   Procedure: ESOPHAGOGASTRODUODENOSCOPY (EGD) WITH PROPOFOL ;  Surgeon: Eartha Angelia Sieving, Anne;  [facility];  Service: Gastroenterology;  Laterality: N/A;  2:30 pm, asa 3   FISTULA SUPERFICIALIZATION Right 08/25/2018   Procedure: FISTULA SUPERFICIALIZATION RIGHT ARM;  Surgeon: Sheree Penne Bruckner, Anne;  Location[facility] OR;  Service: Cardiovascular;  Laterality: Right;   HOT HEMOSTASIS  12/18/2021   Procedure: HOT HEMOSTASIS (ARGON PLASMA COAGULATION/BICAP);  Surgeon: Cindie Carlin POUR, DO;  [facility];  Service: Endoscopy;;   HOT HEMOSTASIS  01/19/2023   Procedure: HOT HEMOSTASIS (ARGON PLASMA COAGULATION/BICAP);  Surgeon: Eartha Angelia, Sieving, Anne;  [facility];  Service: Gastroenterology;;   IR FLUORO GUIDE CV LINE RIGHT  05/31/2018   IR US  GUIDE VASC ACCESS RIGHT  05/31/2018   KNEE SURGERY     right knee @ 68 years of age   LEFT AND RIGHT HEART CATHETERIZATION WITH CORONARY ANGIOGRAM N/A 09/21/2011   Procedure: LEFT AND RIGHT HEART CATHETERIZATION WITH CORONARY ANGIOGRAM;  Surgeon: Salena GORMAN Negri, Anne;  Location: [facility];  Service: Cardiovascular;  Laterality: N/A;   POLYPECTOMY  07/08/2020   Procedure: POLYPECTOMY INTESTINAL;  Surgeon: Shaaron Lamar HERO, Anne;  [facility];  Service: Endoscopy;;   VIDEO BRONCHOSCOPY Left 12/13/2020   Procedure: VIDEO BRONCHOSCOPY WITHOUT FLUORO;  Surgeon: Brenna Adine CROME, DO;  Location: [facility];  Service: Pulmonary;  Laterality: Left;  Cryotherapy    SOCIAL HISTORY:  Social History   Socioeconomic History   Marital status: Divorced    Spouse name: Not on file   Number of children: 1   Years of education: Not on file   Highest education level: Not on file  Occupational History    Employer: UNEMPLOYED  Tobacco Use   Smoking status: Former    Current packs/day: 0.00    Types: Cigarettes    Start date: 04/11/1996    Quit date: 04/11/1996    Years since quitting: 27.8   Smokeless tobacco: Never   Tobacco comments:    quit a couple year ago  Vaping Use   Vaping status: Never Used  Substance and Sexual Activity   Alcohol  use: No   Drug use: No   Sexual activity: Never  Other Topics Concern   Not on file  Social History Narrative   Lives with parents.    Social Drivers of Manufacturing Engineer Strain: Not on file  Food Insecurity: No Food Insecurity (12/17/2021)   Hunger Vital Sign    Worried About Running Out of Food in the Last Year: Never true    Ran Out of Food in the Last Year: Never true  Transportation Needs: No Transportation Needs (12/17/2021)   PRAPARE - Transportation    Lack of  Transportation (Medical): No    Lack of Transportation (Non-Medical): No  Physical Activity: Not on file  Stress: Not on file  Social Connections: Not on file  Intimate Partner Violence: Not At Risk (12/17/2021)   Humiliation, Afraid, Rape, and Kick questionnaire    Fear of Current or Ex-Partner: No    Emotionally Abused: No    Physically Abused: No    Sexually Abused: No    FAMILY HISTORY:  Family History  Problem Relation Age of Onset   Diabetes Mother    Hypertension Mother    Heart failure Mother    Hypertension Father    Atrial fibrillation Father    Hypertension Son    Kidney disease Other        son reportedly has had cyst on kidney and lung s/p surgery at [facility]   Colon cancer Neg Hx     CURRENT MEDICATIONS:  Outpatient Encounter Medications as of 02/23/2024  Medication Sig Note   acetaminophen  (TYLENOL ) 325 MG tablet Take 325 mg by mouth every 12 (twelve) hours as needed for moderate pain (pain score 4-6).    albuterol  (PROVENTIL  HFA;VENTOLIN  HFA) 108 (90 Base) MCG/ACT inhaler Inhale 2 puffs into the lungs every 4 (four) hours as needed for shortness of breath.    apixaban  (ELIQUIS ) 5 MG TABS tablet Take 1 tablet (5 mg total) by mouth 2 (two) times daily.    calcitRIOL  (ROCALTROL ) 0.25 MCG capsule Take 0.25 mcg by mouth every Monday, Wednesday, and Friday. 10/13/2021: 0800   cetirizine  (ZYRTEC ) 10 MG tablet Take 10 mg by mouth daily. 10/13/2021: 0800   Cholecalciferol  (VITAMIN D3) 50 MCG (2000 UT) TABS TAKE 1 TABLET BY MOUTH ONCE DAILY. 10/13/2021: 0800   cyanocobalamin  (VITAMIN B12) 1000 MCG tablet Take 1 tablet (1,000 mcg total) by mouth every other  day.    diltiazem  (CARDIZEM  CD) 240 MG 24 hr capsule Take 1 capsule (240 mg total) by mouth daily. 10/13/2021: 0800   docusate sodium (COLACE) 100 MG capsule Take 100 mg by mouth 2 (two) times daily as needed for mild constipation.    DROPSAFE SAFETY PEN NEEDLES 31G X 6 MM MISC     EASYMAX TEST test strip CHECK BLOOD SUGAR 4 TIMES A DAY. (BREAKFAST, LUNCH, SUPPER & BEDTIME)    ferrous sulfate  325 (65 FE) MG tablet Take 1 tablet (325 mg total) by mouth every other day.    Finerenone  (KERENDIA ) 10 MG TABS Take 10 mg by mouth daily. 10/13/2021: 0800   fluticasone  (FLONASE ) 50 MCG/ACT nasal spray Place 2 sprays into both nostrils daily as needed for allergies.    folic acid  (FOLVITE ) 1 MG tablet Take 1 tablet (1 mg total) by mouth daily.    furosemide  (LASIX ) 20 MG tablet TAKE 1 TABLET BY MOUTH EVERY OTHER DAY,ALTERNATE WITH 40 MG TABLET.    furosemide  (LASIX ) 40 MG tablet TAKE 1 TABLET BY MOUTH EVERY OTHER DAY,ALTERNATE WITH 20 MG TABLET.    GERI-TUSSIN DM 10-100 MG/5ML liquid Take by mouth.    hydrOXYzine (ATARAX) 10 MG tablet Take 10 mg by mouth 3 (three) times daily as needed.    insulin  regular human CONCENTRATED (HUMULIN  R U-500 KWIKPEN) 500 UNIT/ML KwikPen Inject 50 Units into the skin 2 (two) times daily with a meal. INJECT 50 UNITS SUBCUTANEOUSLY AT BREAKFAST, 50 UNITS AT SUPPER; WHEN GLUCOSE IS ABOVE 90 & EATING.(HOLD IF BS BELOW 70: CALL Anne IF BS ABOVE 400)    ipratropium-albuterol  (DUONEB) 0.5-2.5 (3) MG/3ML SOLN Take 3 mLs by nebulization  every 6 (six) hours as needed (for cough).    isosorbide  dinitrate (ISORDIL ) 10 MG tablet Take 1 tablet (10 mg total) by mouth 3 (three) times daily.    Lancets 30G MISC CHECK BLOOD SUGAR 4 TIMES A DAY. (BREAKFAST, LUNCH, SUPPER, & BEDTIME)    loperamide (IMODIUM) 2 MG capsule Take 2-4 mg by mouth See admin instructions. Take 4 mg by mouth now: then 2 mg after each loose stool as needed, do not exceed 5 doses in 24 hours.    losartan  (COZAAR ) 25 MG tablet  Take 12.5 mg by mouth daily.    metolazone (ZAROXOLYN) 5 MG tablet Take 5 mg by mouth in the morning.    metoprolol  tartrate (LOPRESSOR ) 50 MG tablet Take 50 mg by mouth 2 (two) times daily.    montelukast  (SINGULAIR ) 10 MG tablet Take 10 mg by mouth at bedtime.    pantoprazole  (PROTONIX ) 40 MG tablet TAKE 1 TABLET BY MOUTH TWICE DAILY BEFORE MEALS.    polyethylene glycol powder (GLYCOLAX /MIRALAX ) 17 GM/SCOOP powder Take 17 g by mouth daily as needed for mild constipation or moderate constipation.    potassium chloride  SA (KLOR-CON  M) 20 MEQ tablet Take 20 mEq by mouth daily. 10/13/2021: 0800   rosuvastatin  (CRESTOR ) 10 MG tablet Take 10 mg by mouth at bedtime.    TRELEGY ELLIPTA 100-62.5-25 MCG/ACT AEPB Inhale 1 puff into the lungs daily.    triamcinolone cream (KENALOG) 0.5 % Apply 1 application  topically every 12 (twelve) hours as needed (applied to bilateral legs for allergic dermatitis).    [DISCONTINUED] benzonatate  (TESSALON ) 100 MG capsule Take 100 mg by mouth 3 (three) times daily.    [DISCONTINUED] bisacodyl  5 MG EC tablet As directed for procedure instructions    [DISCONTINUED] cyanocobalamin  (VITAMIN B12) 1000 MCG tablet Take 1 tablet (1,000 mcg total) by mouth daily.    [DISCONTINUED] Lancets 30G MISC Use to check blood glucose four times daily as instructed.    [DISCONTINUED] simethicone  (SIMETHICONE  DROPS INFANTS) 40 MG/0.6ML drops As directed for colonoscopy instructions    [DISCONTINUED] sodium phosphate  (FLEET) ENEM Place 133 mLs (1 enema total) rectally as directed.    No facility-administered encounter medications on file as of 02/23/2024.    ALLERGIES:  No Known Allergies  LABORATORY DATA:  I have reviewed the labs as listed.  CBC    Component Value Date/Time   WBC 7.1 02/23/2024 1003   RBC 2.60 (L) 02/23/2024 1003   HGB 7.8 (L) 02/23/2024 1003   HGB 8.3 (L) 01/27/2023 1043   HGB 8.1 (L) 12/28/2022 0905   HCT 25.8 (L) 02/23/2024 1003   HCT 27.9 (L) 12/28/2022  0905   PLT 229 02/23/2024 1003   PLT 272 01/27/2023 1043   PLT 302 12/28/2022 0905   MCV 99.2 02/23/2024 1003   MCV 92 12/28/2022 0905   MCH 30.0 02/23/2024 1003   MCHC 30.2 02/23/2024 1003   RDW 17.3 (H) 02/23/2024 1003   RDW 14.3 12/28/2022 0905   LYMPHSABS 0.8 02/16/2024 1246   LYMPHSABS 0.9 12/28/2022 0905   MONOABS 0.9 02/16/2024 1246   EOSABS 0.2 02/16/2024 1246   EOSABS 0.2 12/28/2022 0905   BASOSABS 0.0 02/16/2024 1246   BASOSABS 0.1 12/28/2022 0905      Latest Ref Rng & Units 02/16/2024   12:46 PM 11/09/2023   10:02 AM 10/05/2023    9:42 AM  CMP  Glucose 70 - 99 mg/dL 97  764  721   BUN 8 - 23 mg/dL 52  50  41   Creatinine 0.44 - 1.00 mg/dL 7.63  8.11  8.28   Sodium 135 - 145 mmol/L 137  133  136   Potassium 3.5 - 5.1 mmol/L 4.4  3.6  2.8   Chloride 98 - 111 mmol/L 98  95  98   CO2 22 - 32 mmol/L 27  26  27    Calcium  8.9 - 10.3 mg/dL 9.6  9.5  9.0   Total Protein 6.5 - 8.1 g/dL 7.2  7.5    Total Bilirubin 0.0 - 1.2 mg/dL 0.4  0.6    Alkaline Phos 38 - 126 U/L 74  77    AST 15 - 41 U/L 16  15    ALT 0 - 44 U/L 8  10      DIAGNOSTIC IMAGING:  I have independently reviewed the relevant imaging and discussed with the patient.   WRAP UP:  All questions were answered. The patient knows to call the clinic with any problems, questions or concerns.  Medical decision making: High (extensive review of chart, discussion with patient, and coordination of care with other specialists)  Time spent on visit: I spent 45 minutes counseling the patient face to face. The total time spent in the appointment was 65 minutes and more than 50% was on counseling.  Pleasant CHRISTELLA Barefoot, PA-C  02/23/24 3:56 PM

## 2024-02-23 NOTE — Progress Notes (Signed)
 Patient presents today for 1 unit of blood products. Vital signs stable. HGB 7.8. Patient seen by provider today . (R.Pennington PA).   1 unit of blood given today per MD orders. Tolerated infusion without adverse affects. Vital signs stable. No complaints at this time. Discharged from clinic by wheel chair and accompanied by care giver from [facility] facility in stable condition. Alert and oriented x 3. F/U with [facility] as scheduled.

## 2024-02-23 NOTE — Patient Instructions (Signed)
 [facility] at [facility] **VISIT SUMMARY & IMPORTANT INSTRUCTIONS **   You were seen today by Pleasant Barefoot PA-C for your follow-up visit.    ANEMIA DUE TO BLOOD LOSS: You have on and off bleeding from your stomach.  This causes severe anemia and low iron  levels. You received 1 unit blood transfusion today.  (You also received 2 units blood transfusion plus IV iron  last week.) Due to your high risk of ongoing blood loss, we will check your blood count once a week for the next 1 to 2 months. I have reached out to your gastroenterologist (Dr. Shaaron).  They will schedule you for office visit next week, as you likely need another endoscopy to help stop the bleeding. Please seek immediate medical attention in the EMERGENCY DEPARTMENT if you have large amounts of black/tarry bowel movements or have symptoms of increasing weakness/fatigue, chest pain, shortness of breath, or altered mental status.  These could be signs of severe anemia.  ANEMIA DUE TO CHRONIC KIDNEY DISEASE: Some of your anemia is also due to your chronic kidney disease, which makes it harder for your body to make new blood cells. To treat your anemia from chronic kidney disease we will start you on Retacrit  injections which will be given here at the [facility] once a week.  We will do this at the same time that we check your blood levels each week.  CARCINOID TUMOR OF THE LUNG Your most recent CT scans showed that the tumor in your lung is stable. We will check additional labs at your next visit and we will continue to check a CT scan once a year. I will also reach out to your lung doctor to see if you should continue to follow-up with them, since you have not seen them since 2022.  MEDICATIONS: - Continue every other day iron  tablet. - Please DECREASE your vitamin B12 to take it every other day. - Continue Eliquis  for your atrial fibrillation, but please speak with your cardiologist and your GI doctor  as to whether or not Eliquis  is still a safe option for you.  FOLLOW-UP APPOINTMENT: Office visit in 6 to 8 weeks  ** Thank you for trusting me with your healthcare!  I strive to provide all of my patients with quality care at each visit.  If you receive a survey for this visit, I would be so grateful to you for taking the time to provide feedback.  Thank you in advance!  ~ Hogan Hoobler                                        Dr. Mickiel Davonna Pleasant Barefoot, PA-C          Delon Hope, NP   - - - - - - - - - - - - - - - - - -    Thank you for choosing [facility] at [facility] to provide your oncology and hematology care.  To afford each patient quality time with our provider, please arrive at least 15 minutes before your scheduled appointment time.   If you have a lab appointment with the Cancer Center please come in thru the Main Entrance and check in at the main information desk.  You need to re-schedule your appointment should you arrive 10 or more minutes late.  We strive to give you quality time with our providers, and arriving late affects you and other patients whose appointments are after yours.  Also, if you no show three or more times for appointments you may be dismissed from the clinic at the providers discretion.     Again, thank you for choosing [facility].  Our hope is that these requests will decrease the amount of time that you wait before being seen by our physicians.       _____________________________________________________________  Should you have questions after your visit to [facility], please contact our office at [phone] and follow the prompts.  Our office hours are 8:00 a.m. and 4:30 p.m. Monday - Friday.  Please note that voicemails left after 4:00 p.m. may not be returned until the following business day.  We are closed weekends and major holidays.  You do have access to a nurse 24-7, just call the  main number to the clinic [phone] and do not press any options, hold on the line and a nurse will answer the phone.    For prescription refill requests, have your pharmacy contact our office and allow 72 hours.

## 2024-02-23 NOTE — Patient Instructions (Signed)
 [facility] [place] [facility]  Discharge Instructions: Thank you for choosing [facility] to provide your oncology and hematology care.  If you have a lab appointment with the Cancer Center - please note that after April 8th, 2024, all labs will be drawn in the cancer center.  You do not have to check in or register with the main entrance as you have in the past but will complete your check-in in the cancer center.  Wear comfortable clothing and clothing appropriate for easy access to any Portacath or PICC line.   We strive to give you quality time with your provider. You may need to reschedule your appointment if you arrive late (15 or more minutes).  Arriving late affects you and other patients whose appointments are after yours.  Also, if you miss three or more appointments without notifying the office, you may be dismissed from the clinic at the provider's discretion.      For prescription refill requests, have your pharmacy contact our office and allow 72 hours for refills to be completed.    Today you received the following chemotherapy and/or immunotherapy agents blood products. Blood Transfusion, Adult, Care After The following information offers guidance on how to care for yourself after your procedure. Your health care provider may also give you more specific instructions. If you have problems or questions, contact your health care provider. What can I expect after the procedure? After the procedure, it is common to have: Bruising and soreness where the IV was inserted. A headache. Follow these instructions at home: IV insertion site care     Follow instructions from your health care provider about how to take care of your IV insertion site. Make sure you: Wash your hands with soap and water for at least 20 seconds before and after you change your bandage (dressing). If soap and water are not available, use hand sanitizer. Change  your dressing as told by your health care provider. Check your IV insertion site every day for signs of infection. Check for: Redness, swelling, or pain. Bleeding from the site. Warmth. Pus or a bad smell. General instructions Take over-the-counter and prescription medicines only as told by your health care provider. Rest as told by your health care provider. Return to your normal activities as told by your health care provider. Keep all follow-up visits. Lab tests may need to be done at certain periods to recheck your blood counts. Contact a health care provider if: You have itching or red, swollen areas of skin (hives). You have a fever or chills. You have pain in the head, back, or chest. You feel anxious or you feel weak after doing your normal activities. You have redness, swelling, warmth, or pain around the IV insertion site. You have blood coming from the IV insertion site that does not stop with pressure. You have pus or a bad smell coming from your IV insertion site. If you received your blood transfusion in an outpatient setting, you will be told whom to contact to report any reactions. Get help right away if: You have symptoms of a serious allergic or immune system reaction, including: Trouble breathing or shortness of breath. Swelling of the face, feeling flushed, or widespread rash. Dark urine or blood in the urine. Fast heartbeat. These symptoms may be an emergency. Get help right away. Call 911. Do not wait to see if the symptoms will go away. Do not drive yourself  to the hospital. Summary Bruising and soreness around the IV insertion site are common. Check your IV insertion site every day for signs of infection. Rest as told by your health care provider. Return to your normal activities as told by your health care provider. Get help right away for symptoms of a serious allergic or immune system reaction to the blood transfusion. This information is not intended to  replace advice given to you by your health care provider. Make sure you discuss any questions you have with your health care provider. Document Revised: 06/13/2021 Document Reviewed: 06/13/2021 Elsevier Patient Education  2024 Elsevier Inc.      To help prevent nausea and vomiting after your treatment, we encourage you to take your nausea medication as directed.  BELOW ARE SYMPTOMS THAT SHOULD BE REPORTED IMMEDIATELY: *FEVER GREATER THAN 100.4 F (38 C) OR HIGHER *CHILLS OR SWEATING *NAUSEA AND VOMITING THAT IS NOT CONTROLLED WITH YOUR NAUSEA MEDICATION *UNUSUAL SHORTNESS OF BREATH *UNUSUAL BRUISING OR BLEEDING *URINARY PROBLEMS (pain or burning when urinating, or frequent urination) *BOWEL PROBLEMS (unusual diarrhea, constipation, pain near the anus) TENDERNESS IN MOUTH AND THROAT WITH OR WITHOUT PRESENCE OF ULCERS (sore throat, sores in mouth, or a toothache) UNUSUAL RASH, SWELLING OR PAIN  UNUSUAL VAGINAL DISCHARGE OR ITCHING   Items with * indicate a potential emergency and should be followed up as soon as possible or go to the Emergency Department if any problems should occur.  Please show the CHEMOTHERAPY ALERT CARD or IMMUNOTHERAPY ALERT CARD at check-in to the Emergency Department and triage nurse.  Should you have questions after your visit or need to cancel or reschedule your appointment, please contact [facility] [place] - A DEPT OF Eligha Bridegroom [facility] [phone]  and follow the prompts.  Office hours are 8:00 a.m. to 4:30 p.m. Monday - Friday. Please note that voicemails left after 4:00 p.m. may not be returned until the following business day.  We are closed weekends and major holidays. You have access to a nurse at all times for urgent questions. Please call the main number to the clinic [phone] and follow the prompts.  For any non-urgent questions, you may also contact your provider using MyChart. We now offer e-Visits for anyone 62 and older to  request care online for non-urgent symptoms. For details visit [email or web address].   Also download the MyChart app! Go to the app store, search "MyChart", open the app, select [place], and log in with your MyChart username and password.

## 2024-02-24 LAB — TYPE AND SCREEN
ABO/RH(D): A POS
Antibody Screen: NEGATIVE
Unit division: 0

## 2024-02-24 LAB — BPAM RBC
Blood Product Expiration Date: 202512142359
ISSUE DATE / TIME: 202511261216
Unit Type and Rh: 6200

## 2024-02-28 ENCOUNTER — Inpatient Hospital Stay

## 2024-02-28 ENCOUNTER — Ambulatory Visit (HOSPITAL_COMMUNITY)

## 2024-02-28 ENCOUNTER — Inpatient Hospital Stay: Attending: Oncology

## 2024-02-28 VITALS — BP 127/62 | HR 56 | Temp 97.7°F | Resp 17

## 2024-02-28 DIAGNOSIS — D500 Iron deficiency anemia secondary to blood loss (chronic): Secondary | ICD-10-CM

## 2024-02-28 DIAGNOSIS — D631 Anemia in chronic kidney disease: Secondary | ICD-10-CM | POA: Diagnosis present

## 2024-02-28 DIAGNOSIS — N186 End stage renal disease: Secondary | ICD-10-CM | POA: Diagnosis present

## 2024-02-28 DIAGNOSIS — N184 Chronic kidney disease, stage 4 (severe): Secondary | ICD-10-CM | POA: Insufficient documentation

## 2024-02-28 LAB — CBC
HCT: 28.4 % — ABNORMAL LOW (ref 36.0–46.0)
Hemoglobin: 8.9 g/dL — ABNORMAL LOW (ref 12.0–15.0)
MCH: 30.9 pg (ref 26.0–34.0)
MCHC: 31.3 g/dL (ref 30.0–36.0)
MCV: 98.6 fL (ref 80.0–100.0)
Platelets: 195 K/uL (ref 150–400)
RBC: 2.88 MIL/uL — ABNORMAL LOW (ref 3.87–5.11)
RDW: 16.5 % — ABNORMAL HIGH (ref 11.5–15.5)
WBC: 6.4 K/uL (ref 4.0–10.5)
nRBC: 0 % (ref 0.0–0.2)

## 2024-02-28 LAB — SAMPLE TO BLOOD BANK

## 2024-02-28 MED ORDER — EPOETIN ALFA-EPBX 10000 UNIT/ML IJ SOLN
10000.0000 [IU] | Freq: Once | INTRAMUSCULAR | Status: AC
  Administered 2024-02-28: 10000 [IU] via SUBCUTANEOUS
  Filled 2024-02-28: qty 1

## 2024-02-28 NOTE — Progress Notes (Signed)
 Patient presents today for Retacrit  injection. Hgb 8.9/ Hct 28.4. Patient tolerated injection in left arm with no complaints voiced.  Site clean and dry with no bruising or swelling noted.  No complaints of pain.  Discharged with vital signs stable and no signs or symptoms of distress noted.

## 2024-02-28 NOTE — Patient Instructions (Signed)
 [facility] [place] [facility]  Discharge Instructions: Thank you for choosing [facility] to provide your oncology and hematology care.  If you have a lab appointment with the Cancer Center - please note that after April 8th, 2024, all labs will be drawn in the cancer center.  You do not have to check in or register with the main entrance as you have in the past but will complete your check-in in the cancer center.  Wear comfortable clothing and clothing appropriate for easy access to any Portacath or PICC line.   We strive to give you quality time with your provider. You may need to reschedule your appointment if you arrive late (15 or more minutes).  Arriving late affects you and other patients whose appointments are after yours.  Also, if you miss three or more appointments without notifying the office, you may be dismissed from the clinic at the provider's discretion.      For prescription refill requests, have your pharmacy contact our office and allow 72 hours for refills to be completed.    Today you received the following :  Retacrit.  Epoetin Alfa Injection What is this medication? EPOETIN ALFA (e POE e tin AL fa) treats low levels of red blood cells (anemia) caused by kidney disease, chemotherapy, or HIV medications. It can also be used in people who are at risk for blood loss during surgery. It works by Systems analyst make more red blood cells, which reduces the need for blood transfusions. This medicine may be used for other purposes; ask your health care provider or pharmacist if you have questions. COMMON BRAND NAME(S): Epogen, Procrit, Retacrit What should I tell my care team before I take this medication? They need to know if you have any of these conditions: Blood clots Cancer Heart disease High blood pressure On dialysis Seizures Stroke An unusual or allergic reaction to epoetin alfa, albumin, benzyl alcohol, other  medications, foods, dyes, or preservatives Pregnant or trying to get pregnant Breast-feeding How should I use this medication? This medication is injected into a vein or under the skin. It is usually given by your care team in a hospital or clinic setting. It may also be given at home. If you get this medication at home, you will be taught how to prepare and give it. Use exactly as directed. Take it as directed on the prescription label at the same time every day. Keep taking it unless your care team tells you to stop. It is important that you put your used needles and syringes in a special sharps container. Do not put them in a trash can. If you do not have a sharps container, call your pharmacist or care team to get one. A special MedGuide will be given to you by the pharmacist with each prescription and refill. Be sure to read this information carefully each time. Talk to your care team about the use of this medication in children. While this medication may be used in children as young as 1 month of age for selected conditions, precautions do apply. Overdosage: If you think you have taken too much of this medicine contact a poison control center or emergency room at once. NOTE: This medicine is only for you. Do not share this medicine with others. What if I miss a dose? If you miss a dose, take it as soon as you can. If it is almost time for your  next dose, take only that dose. Do not take double or extra doses. What may interact with this medication? Darbepoetin alfa Methoxy polyethylene glycol-epoetin beta This list may not describe all possible interactions. Give your health care provider a list of all the medicines, herbs, non-prescription drugs, or dietary supplements you use. Also tell them if you smoke, drink alcohol, or use illegal drugs. Some items may interact with your medicine. What should I watch for while using this medication? Visit your care team for regular checks on your  progress. Check your blood pressure as directed. Know what your blood pressure should be and when to contact your care team. Your condition will be monitored carefully while you are receiving this medication. You may need blood work while taking this medication. What side effects may I notice from receiving this medication? Side effects that you should report to your care team as soon as possible: Allergic reactions--skin rash, itching, hives, swelling of the face, lips, tongue, or throat Blood clot--pain, swelling, or warmth in the leg, shortness of breath, chest pain Heart attack--pain or tightness in the chest, shoulders, arms, or jaw, nausea, shortness of breath, cold or clammy skin, feeling faint or lightheaded Increase in blood pressure Rash, fever, and swollen lymph nodes Redness, blistering, peeling, or loosening of the skin, including inside the mouth Seizures Stroke--sudden numbness or weakness of the face, arm, or leg, trouble speaking, confusion, trouble walking, loss of balance or coordination, dizziness, severe headache, change in vision Side effects that usually do not require medical attention (report to your care team if they continue or are bothersome): Bone, joint, or muscle pain Cough Headache Nausea Pain, redness, or irritation at injection site This list may not describe all possible side effects. Call your doctor for medical advice about side effects. You may report side effects to FDA at 1-800-FDA-1088. Where should I keep my medication? Keep out of the reach of children and pets. Store in a refrigerator. Do not freeze. Do not shake. Protect from light. Keep this medication in the original container until you are ready to take it. See product for storage information. Get rid of any unused medication after the expiration date. To get rid of medications that are no longer needed or have expired: Take the medication to a medication take-back program. Check with your  pharmacy or law enforcement to find a location. If you cannot return the medication, ask your pharmacist or care team how to get rid of the medication safely. NOTE: This sheet is a summary. It may not cover all possible information. If you have questions about this medicine, talk to your doctor, pharmacist, or health care provider.  2024 Elsevier/Gold Standard (2021-07-18 00:00:00)    To help prevent nausea and vomiting after your treatment, we encourage you to take your nausea medication as directed.  BELOW ARE SYMPTOMS THAT SHOULD BE REPORTED IMMEDIATELY: *FEVER GREATER THAN 100.4 F (38 C) OR HIGHER *CHILLS OR SWEATING *NAUSEA AND VOMITING THAT IS NOT CONTROLLED WITH YOUR NAUSEA MEDICATION *UNUSUAL SHORTNESS OF BREATH *UNUSUAL BRUISING OR BLEEDING *URINARY PROBLEMS (pain or burning when urinating, or frequent urination) *BOWEL PROBLEMS (unusual diarrhea, constipation, pain near the anus) TENDERNESS IN MOUTH AND THROAT WITH OR WITHOUT PRESENCE OF ULCERS (sore throat, sores in mouth, or a toothache) UNUSUAL RASH, SWELLING OR PAIN  UNUSUAL VAGINAL DISCHARGE OR ITCHING   Items with * indicate a potential emergency and should be followed up as soon as possible or go to the Emergency Department if any problems should  occur.  Please show the CHEMOTHERAPY ALERT CARD or IMMUNOTHERAPY ALERT CARD at check-in to the Emergency Department and triage nurse.  Should you have questions after your visit or need to cancel or reschedule your appointment, please contact [facility] [place] - A DEPT OF Eligh[facility] Bridegroom [facility] [phone]  and follow the prompts.  Office hours are 8:00 a.m. to 4:30 p.m. Monday - Friday. Please note that voicemails left after 4:00 p.m. may not be returned until the following business day.  We are closed weekends and major holidays. You have access to a nurse at all times for urgent questions. Please call the main number to the clinic [phone] and follow the  prompts.  For any non-urgent questions, you may also contact your provider using MyChart. We now offer e-Visits for anyone 73 and older to request care online for non-urgent symptoms. For details visit [email or web address].   Also download the MyChart app! Go to the app store, search "MyChart", open the app, select [place], and log in with your MyChart username and password.

## 2024-02-28 NOTE — Progress Notes (Signed)
 Orders placed under this lab encounter.

## 2024-03-02 ENCOUNTER — Ambulatory Visit: Attending: Cardiology

## 2024-03-02 DIAGNOSIS — I5032 Chronic diastolic (congestive) heart failure: Secondary | ICD-10-CM | POA: Insufficient documentation

## 2024-03-05 LAB — ECHOCARDIOGRAM COMPLETE
AR max vel: 2.19 cm2
AV Area VTI: 2.07 cm2
AV Area mean vel: 2.29 cm2
AV Mean grad: 9 mmHg
AV Peak grad: 18.5 mmHg
Ao pk vel: 2.15 m/s
Area-P 1/2: 4.44 cm2
Calc EF: 53 %
MV VTI: 2.52 cm2
S' Lateral: 3.6 cm
Single Plane A2C EF: 46.5 %
Single Plane A4C EF: 55.9 %

## 2024-03-06 ENCOUNTER — Encounter: Payer: Self-pay | Admitting: Oncology

## 2024-03-06 ENCOUNTER — Inpatient Hospital Stay

## 2024-03-06 ENCOUNTER — Ambulatory Visit: Payer: Self-pay | Admitting: Cardiology

## 2024-03-06 DIAGNOSIS — N184 Chronic kidney disease, stage 4 (severe): Secondary | ICD-10-CM | POA: Insufficient documentation

## 2024-03-06 DIAGNOSIS — N186 End stage renal disease: Secondary | ICD-10-CM | POA: Diagnosis not present

## 2024-03-06 DIAGNOSIS — D500 Iron deficiency anemia secondary to blood loss (chronic): Secondary | ICD-10-CM

## 2024-03-06 DIAGNOSIS — D631 Anemia in chronic kidney disease: Secondary | ICD-10-CM

## 2024-03-06 LAB — SAMPLE TO BLOOD BANK

## 2024-03-06 LAB — CBC
HCT: 29.7 % — ABNORMAL LOW (ref 36.0–46.0)
Hemoglobin: 9.4 g/dL — ABNORMAL LOW (ref 12.0–15.0)
MCH: 30.5 pg (ref 26.0–34.0)
MCHC: 31.6 g/dL (ref 30.0–36.0)
MCV: 96.4 fL (ref 80.0–100.0)
Platelets: 270 K/uL (ref 150–400)
RBC: 3.08 MIL/uL — ABNORMAL LOW (ref 3.87–5.11)
RDW: 16.3 % — ABNORMAL HIGH (ref 11.5–15.5)
WBC: 7.7 K/uL (ref 4.0–10.5)
nRBC: 0 % (ref 0.0–0.2)

## 2024-03-07 ENCOUNTER — Other Ambulatory Visit: Payer: Self-pay | Admitting: Physician Assistant

## 2024-03-07 ENCOUNTER — Ambulatory Visit: Admitting: Internal Medicine

## 2024-03-07 ENCOUNTER — Inpatient Hospital Stay

## 2024-03-07 ENCOUNTER — Encounter: Payer: Self-pay | Admitting: Internal Medicine

## 2024-03-07 ENCOUNTER — Telehealth: Payer: Self-pay | Admitting: *Deleted

## 2024-03-07 VITALS — BP 127/70 | HR 65 | Temp 96.8°F | Resp 20

## 2024-03-07 VITALS — BP 104/43 | HR 53 | Temp 97.4°F | Ht 62.0 in | Wt 231.6 lb

## 2024-03-07 DIAGNOSIS — D500 Iron deficiency anemia secondary to blood loss (chronic): Secondary | ICD-10-CM

## 2024-03-07 DIAGNOSIS — D3A090 Benign carcinoid tumor of the bronchus and lung: Secondary | ICD-10-CM

## 2024-03-07 DIAGNOSIS — Z79899 Other long term (current) drug therapy: Secondary | ICD-10-CM | POA: Diagnosis not present

## 2024-03-07 DIAGNOSIS — K31819 Angiodysplasia of stomach and duodenum without bleeding: Secondary | ICD-10-CM

## 2024-03-07 DIAGNOSIS — N186 End stage renal disease: Secondary | ICD-10-CM | POA: Diagnosis not present

## 2024-03-07 DIAGNOSIS — Z7901 Long term (current) use of anticoagulants: Secondary | ICD-10-CM | POA: Diagnosis not present

## 2024-03-07 DIAGNOSIS — N184 Chronic kidney disease, stage 4 (severe): Secondary | ICD-10-CM

## 2024-03-07 DIAGNOSIS — K219 Gastro-esophageal reflux disease without esophagitis: Secondary | ICD-10-CM | POA: Diagnosis not present

## 2024-03-07 MED ORDER — EPOETIN ALFA-EPBX 10000 UNIT/ML IJ SOLN
10000.0000 [IU] | Freq: Once | INTRAMUSCULAR | Status: AC
  Administered 2024-03-07: 10000 [IU] via SUBCUTANEOUS
  Filled 2024-03-07: qty 1

## 2024-03-07 MED ORDER — LANSOPRAZOLE 30 MG PO CPDR: 30.0000 mg | DELAYED_RELEASE_CAPSULE | Freq: Two times a day (BID) | ORAL | 11 refills | Status: AC

## 2024-03-07 NOTE — Progress Notes (Signed)
 Referral entered to pulmonology, as recommended by note from CT surgeon in 2022, who recommended ongoing pulm f/u, although patient has been temporarily lost to f/u.

## 2024-03-07 NOTE — Telephone Encounter (Signed)
  Request for patient to stop medication prior to procedure or is needing cleareance  03/07/24  Rollene DELENA Lee 1955/10/17  What type of surgery is being performed? Esophagogastroduodenoscopy (EGD) w/ APC  When is surgery scheduled? TBD  What type of clearance is required (medical or pharmacy to hold medication or both? medication  Are there any medications that need to be held prior to surgery and how long? Eliquis  x 2 days  Name of physician performing surgery?  Dr.Rourk [facility] at [employer]: [phone], option 5 Fax: [phone]  Anesthesia type (none, local, MAC, general)? MAC   ? Yes ? No Patient can hold medication as requested   Signature: ___________________________

## 2024-03-07 NOTE — Progress Notes (Signed)
 Gastroenterology Progress Note    Primary Care Physician:  Carlette Benita Area, MD Primary Gastroenterologist:  Dr. Shaaron  Pre-Procedure History & Physical: HPI:  [name] is a 68 y.o. female here for recurrent iron  deficiency anemia secondary to occult GI bleeding.  Known portal hypertensive gastropathy/GAVE actively oozing found on EGD earlier this year.  GAVE treated with APC she had a drop in her hemoglobin to 6 recent requiring blood transfusion and ongoing parenteral iron  she has multiple comorbidities, carcinoid of the lung-surveillance.  Chronic kidney disease stage III.  Has been receiving Epogen . She has had intermittent dark stools darker than usual but baseline they are darker with oral iron .  She states reflux symptoms suboptimally controlled on even twice daily pantoprazole . Past Medical History:  Diagnosis Date   Arthritis    Asthma    Atrial fibrillation (HCC)    Carcinoid tumor determined by biopsy of lung (HCC) 05/2018   Left lung   Chronic diastolic CHF (congestive heart failure) (HCC)    CKD (chronic kidney disease), stage III (HCC)    Diabetes mellitus type 2 in obese    Essential hypertension    GERD (gastroesophageal reflux disease)    Heart murmur    Iron  deficiency anemia 10/16/2010   Mental handicap 10/16/2010   Mild CAD 2013   Morbid obesity (HCC)    OSA (obstructive sleep apnea)     Past Surgical History:  Procedure Laterality Date   ABDOMINAL HYSTERECTOMY     AV FISTULA PLACEMENT Right 06/06/2018   Procedure: ARTERIOVENOUS (AV) FISTULA CREATION;  Surgeon: Gretta Lonni PARAS, MD;  Location: [facility] OR;  Service: Vascular;  Laterality: Right;   BIOPSY  10/16/2021   Procedure: BIOPSY;  Surgeon: Shaaron Lamar HERO, MD;  [facility];  Service: Endoscopy;;   BRONCHIAL DILITATION  12/13/2020   Procedure: BRONCHIAL DILITATION;  Surgeon: Brenna Adine CROME, DO;  [facility]: [facility];  Service: Pulmonary;;   CESAREAN SECTION      CHOLECYSTECTOMY     COLONOSCOPY  08/2010   normal TI, sigmoid polyp (adenoma ). Next TCS due  08/2015,   COLONOSCOPY N/A 10/06/2023   Procedure: COLONOSCOPY;  Surgeon: Shaaron Lamar HERO, MD;  [facility];  Service: Endoscopy;  Laterality: N/A;  1:00pm, asa 3,a t [place]   COLONOSCOPY WITH PROPOFOL  N/A 07/08/2020   Procedure: COLONOSCOPY WITH PROPOFOL ;  Surgeon: Shaaron Lamar HERO, MD;  [facility];  Service: Endoscopy;  Laterality: N/A;  AM (diabetic and facility patient)   ESOPHAGOGASTRODUODENOSCOPY  08/2010   antral and duodenal erosions s/p bx (chronic gastritis, no h.pylori, no celiac dz ), hiatal hernia   ESOPHAGOGASTRODUODENOSCOPY N/A 10/06/2023   Procedure: EGD (ESOPHAGOGASTRODUODENOSCOPY);  Surgeon: Shaaron Lamar HERO, MD;  [facility];  Service: Endoscopy;  Laterality: N/A;   ESOPHAGOGASTRODUODENOSCOPY (EGD) WITH PROPOFOL  N/A 10/16/2021   Procedure: ESOPHAGOGASTRODUODENOSCOPY (EGD) WITH PROPOFOL ;  Surgeon: Shaaron Lamar HERO, MD;  [facility];  Service: Endoscopy;  Laterality: N/A;  11:45am   ESOPHAGOGASTRODUODENOSCOPY (EGD) WITH PROPOFOL  N/A 12/18/2021   Procedure: ESOPHAGOGASTRODUODENOSCOPY (EGD) WITH PROPOFOL ;  Surgeon: Cindie Carlin POUR, DO;  [facility];  Service: Endoscopy;  Laterality: N/A;   ESOPHAGOGASTRODUODENOSCOPY (EGD) WITH PROPOFOL  N/A 01/19/2023   Procedure: ESOPHAGOGASTRODUODENOSCOPY (EGD) WITH PROPOFOL ;  Surgeon: Eartha Angelia Sieving, MD;  [facility];  Service: Gastroenterology;  Laterality: N/A;  2:30 pm, asa 3   FISTULA SUPERFICIALIZATION Right 08/25/2018   Procedure: FISTULA SUPERFICIALIZATION RIGHT ARM;  Surgeon:  Sheree Penne Bruckner, MD;  Location: [facility] OR;  Service: Cardiovascular;  Laterality: Right;   HOT HEMOSTASIS  12/18/2021   Procedure: HOT HEMOSTASIS (ARGON PLASMA COAGULATION/BICAP);  Surgeon: Cindie Carlin POUR, DO;  [facility];  Service: Endoscopy;;   HOT HEMOSTASIS  01/19/2023    Procedure: HOT HEMOSTASIS (ARGON PLASMA COAGULATION/BICAP);  Surgeon: Eartha Flavors, Toribio, MD;  [facility];  Service: Gastroenterology;;   IR FLUORO GUIDE CV LINE RIGHT  05/31/2018   IR US  GUIDE VASC ACCESS RIGHT  05/31/2018   KNEE SURGERY     right knee @ 68 years of age   LEFT AND RIGHT HEART CATHETERIZATION WITH CORONARY ANGIOGRAM N/A 09/21/2011   Procedure: LEFT AND RIGHT HEART CATHETERIZATION WITH CORONARY ANGIOGRAM;  Surgeon: Salena GORMAN Negri, MD;  [facility]: [facility];  Service: Cardiovascular;  Laterality: N/A;   POLYPECTOMY  07/08/2020   Procedure: POLYPECTOMY INTESTINAL;  Surgeon: Shaaron Lamar HERO, MD;  [facility];  Service: Endoscopy;;   VIDEO BRONCHOSCOPY Left 12/13/2020   Procedure: VIDEO BRONCHOSCOPY WITHOUT FLUORO;  Surgeon: Brenna Adine CROME, DO;  [facility]: [facility];  Service: Pulmonary;  Laterality: Left;  Cryotherapy    Prior to Admission medications   Medication Sig Start Date End Date Taking? Authorizing Provider  acetaminophen  (TYLENOL ) 325 MG tablet Take 325 mg by mouth every 12 (twelve) hours as needed for moderate pain (pain score 4-6).   Yes [provider]  albuterol  (PROVENTIL  HFA;VENTOLIN  HFA) 108 (90 Base) MCG/ACT inhaler Inhale 2 puffs into the lungs every 4 (four) hours as needed for shortness of breath.   Yes [provider]  apixaban  (ELIQUIS ) 5 MG TABS tablet Take 1 tablet (5 mg total) by mouth 2 (two) times daily. 12/20/21  Yes Pearlean Manus, MD  calcitRIOL  (ROCALTROL ) 0.25 MCG capsule Take 0.25 mcg by mouth every Monday, Wednesday, and Friday. 08/08/20  Yes [provider]  cetirizine  (ZYRTEC ) 10 MG tablet Take 10 mg by mouth daily. 06/06/19  Yes [provider]  Cholecalciferol  (VITAMIN D3) 50 MCG (2000 UT) TABS TAKE 1 TABLET BY MOUTH ONCE DAILY. 01/02/21  Yes Nida, Gebreselassie W, MD  cyanocobalamin  (VITAMIN B12) 1000 MCG tablet Take 1 tablet (1,000 mcg total) by mouth every other day. 02/23/24  Yes  Pennington, Rebekah M, PA-C  diltiazem  (CARDIZEM  CD) 240 MG 24 hr capsule Take 1 capsule (240 mg total) by mouth daily. 06/10/18  Yes Rojelio Nest, DO  docusate sodium (COLACE) 100 MG capsule Take 100 mg by mouth 2 (two) times daily as needed for mild constipation.   Yes [provider]  DROPSAFE SAFETY PEN NEEDLES 31G X 6 MM MISC  06/17/23  Yes [provider]  EASYMAX TEST test strip CHECK BLOOD SUGAR 4 TIMES A DAY. (BREAKFAST, LUNCH, SUPPER & BEDTIME) 02/08/24  Yes Nida, Ethelle ORN, MD  ferrous sulfate  325 (65 FE) MG tablet Take 1 tablet (325 mg total) by mouth every other day. 02/10/23  Yes Davonna Siad, MD  Finerenone  (KERENDIA ) 10 MG TABS Take 10 mg by mouth daily.   Yes [provider]  fluticasone  (FLONASE ) 50 MCG/ACT nasal spray Place 2 sprays into both nostrils daily as needed for allergies.   Yes [provider]  folic acid  (FOLVITE ) 1 MG tablet Take 1 tablet (1 mg total) by mouth daily. 04/27/23  Yes Pennington, Rebekah M, PA-C  furosemide  (LASIX ) 20 MG tablet TAKE 1 TABLET BY MOUTH EVERY OTHER DAY,ALTERNATE WITH 40 MG TABLET. 08/30/23  Yes Miriam Norris, NP  furosemide  (LASIX ) 40 MG tablet TAKE 1 TABLET BY MOUTH EVERY OTHER DAY,ALTERNATE WITH 20 MG TABLET. 07/30/23  Yes Debera Jayson MATSU, MD  GERI-TUSSIN DM 10-100 MG/5ML liquid Take by mouth. 12/16/21  Yes [provider]  hydrOXYzine (ATARAX) 10 MG tablet Take 10 mg by mouth 3 (three) times daily as needed. 06/07/23  Yes [provider]  insulin  regular human CONCENTRATED (HUMULIN  R U-500 KWIKPEN) 500 UNIT/ML KwikPen Inject 50 Units into the skin 2 (two) times daily with a meal. INJECT 50 UNITS SUBCUTANEOUSLY AT BREAKFAST, 50 UNITS AT SUPPER; WHEN GLUCOSE IS ABOVE 90 & EATING.(HOLD IF BS BELOW 70: CALL MD IF BS ABOVE 400) 12/16/23  Yes Nida, Ethelle ORN, MD  ipratropium-albuterol  (DUONEB) 0.5-2.5 (3) MG/3ML SOLN Take 3 mLs by nebulization every 6 (six) hours as needed (for  cough).   Yes [provider]  isosorbide  dinitrate (ISORDIL ) 10 MG tablet Take 1 tablet (10 mg total) by mouth 3 (three) times daily. 06/09/18  Yes Rojelio Nest, DO  Lancets 30G MISC CHECK BLOOD SUGAR 4 TIMES A DAY. (BREAKFAST, LUNCH, SUPPER, & BEDTIME) 02/16/24  Yes Nida, Gebreselassie W, MD  loperamide (IMODIUM) 2 MG capsule Take 2-4 mg by mouth See admin instructions. Take 4 mg by mouth now: then 2 mg after each loose stool as needed, do not exceed 5 doses in 24 hours.   Yes [provider]  losartan  (COZAAR ) 25 MG tablet Take 12.5 mg by mouth daily.   Yes [provider]  metolazone (ZAROXOLYN) 5 MG tablet Take 5 mg by mouth in the morning.   Yes [provider]  metoprolol  tartrate (LOPRESSOR ) 50 MG tablet Take 50 mg by mouth 2 (two) times daily. 05/08/20  Yes [provider]  montelukast  (SINGULAIR ) 10 MG tablet Take 10 mg by mouth at bedtime.   Yes [provider]  pantoprazole  (PROTONIX ) 40 MG tablet TAKE 1 TABLET BY MOUTH TWICE DAILY BEFORE MEALS. 02/01/24  Yes Ezzard Sonny RAMAN, PA-C  polyethylene glycol powder (GLYCOLAX /MIRALAX ) 17 GM/SCOOP powder Take 17 g by mouth daily as needed for mild constipation or moderate constipation.   Yes [provider]  potassium chloride  SA (KLOR-CON  M) 20 MEQ tablet Take 20 mEq by mouth daily. 09/11/19  Yes [provider]  rosuvastatin  (CRESTOR ) 10 MG tablet Take 10 mg by mouth at bedtime. 09/29/23  Yes [provider]  TRELEGY ELLIPTA 100-62.5-25 MCG/ACT AEPB Inhale 1 puff into the lungs daily. 12/14/22  Yes [provider]  triamcinolone cream (KENALOG) 0.5 % Apply 1 application  topically every 12 (twelve) hours as needed (applied to bilateral legs for allergic dermatitis).   Yes [provider]    Allergies as of 03/07/2024   (No Known Allergies)    Family History  Problem Relation Age of Onset   Diabetes Mother    Hypertension Mother    Heart failure  Mother    Hypertension Father    Atrial fibrillation Father    Hypertension Son    Kidney disease Other        son reportedly has had cyst on kidney and lung s/p surgery at [facility]   Colon cancer Neg Hx     Social History   Socioeconomic History   Marital status: Divorced    Spouse name: Not on file   Number of children: 1   Years of education: Not on file   Highest education level: Not on file  Occupational History    Employer: UNEMPLOYED  Tobacco Use  Smoking status: Former    Current packs/day: 0.00    Types: Cigarettes    Start date: 04/11/1996    Quit date: 04/11/1996    Years since quitting: 27.9   Smokeless tobacco: Never   Tobacco comments:    quit a couple year ago  Vaping Use   Vaping status: Never Used  Substance and Sexual Activity   Alcohol  use: No   Drug use: No   Sexual activity: Never  Other Topics Concern   Not on file  Social History Narrative   Lives with parents.    Social Drivers of Corporate Investment Banker Strain: Not on file  Food Insecurity: No Food Insecurity (12/17/2021)   Hunger Vital Sign    Worried About Running Out of Food in the Last Year: Never true    Ran Out of Food in the Last Year: Never true  Transportation Needs: No Transportation Needs (12/17/2021)   PRAPARE - Administrator, Civil Service (Medical): No    Lack of Transportation (Non-Medical): No  Physical Activity: Not on file  Stress: Not on file  Social Connections: Not on file  Intimate Partner Violence: Not At Risk (12/17/2021)   Humiliation, Afraid, Rape, and Kick questionnaire    Fear of Current or Ex-Partner: No    Emotionally Abused: No    Physically Abused: No    Sexually Abused: No    Review of Systems   See HPI, otherwise negative ROS  Physical Exam: BP (!) 104/43   Pulse (!) 53   Temp (!) 97.4 F (36.3 C) (Temporal)   Ht 5' 2 (1.575 m)   Wt 231 lb 9.6 oz (105.1 kg)   SpO2 95%   BMI 42.36 kg/m  General:   Alert,  Well-developed,  well-nourished, pleasant and cooperative in NAD  Impression/Plan:   Very pleasant 68 year old lady with atrial fibrillation on Eliquis  multiple other comorbidities including chronic kidney disease stage III, heart failure, OSA, and pulmonary carcinoid-surveillance presenting with acute on chronic iron  deficiency anemia GI bleeding likely emanating from her stomach.  Known GAVE.  At this point I feel would be reasonable to go ahead and relook at her upper GI tract and treat any residual GAVE.  Patient understands that this will not cure anything we are basically treating lesions that can recur.  She has not had her small bowel imaged.  This may or may not need to be done in the future.  If she continues to have recurrent bleeding would consider a serious  discussion whether or not the benefits outweigh the risk of continuing anticoagulation therapy.  I have offered the patient EGD with therapeutic intervention as feasible/approwed.  Questions have been answered. All parties agreeable. The risks, benefits, limitations, alternatives and imponderables have been reviewed with the patient. Potential for esophageal dilation, biopsy, etc. have also been revie  In addition, she states that Protonix  twice daily is not controlling her reflux symptoms very well.  Will switch her out to lansoprazole  30 mg orally twice daily taken 30 minutes before breakfast and supper.  Further recommendations to follow.     Notice: This dictation was prepared with Dragon dictation along with smaller phrase technology. Any transcriptional errors that result from this process are unintentional and may not be corrected upon review.

## 2024-03-07 NOTE — Patient Instructions (Signed)

## 2024-03-07 NOTE — Patient Instructions (Signed)
 It was nice to see you again today!  As discussed, we will repeat the EGD and treat the abnormal blood vessels as we did before.  ASA 3 room 3  He will need to stop the Eliquis  for 2 days prior to the procedure.  Lets stop the Protonix ; new prescription for Prevacid  30 mg capsule; take 1 twice daily 30 minutes for breakfast and supper (dispense 60 with 11 refills.  We may need to have a serious conversation about the risk and benefits of whether or not to continue Eliquis  in the future.  However, lets repeat the EGD and see what we can do to slow episodes of bleeding.  Further recommendations to follow.

## 2024-03-07 NOTE — Addendum Note (Signed)
 Addended by: WELLINGTON MILLING on: 03/07/2024 02:44 PM   Modules accepted: Orders

## 2024-03-07 NOTE — Progress Notes (Signed)
Patient presents today for Retacrit injection. Hemoglobin reviewed prior to administration. VSS tolerated without incident or complaint. See MAR for details. Patient stable during and after injection. Patient discharged in satisfactory condition with no s/s of distress noted.  

## 2024-03-08 ENCOUNTER — Other Ambulatory Visit (HOSPITAL_COMMUNITY): Payer: Self-pay | Admitting: Gerontology

## 2024-03-08 ENCOUNTER — Ambulatory Visit (HOSPITAL_COMMUNITY)

## 2024-03-08 ENCOUNTER — Ambulatory Visit (HOSPITAL_COMMUNITY)
Admission: RE | Admit: 2024-03-08 | Discharge: 2024-03-08 | Disposition: A | Discharge status: Home / Self Care | Source: Ambulatory Visit | Attending: Gerontology | Admitting: Gerontology

## 2024-03-08 DIAGNOSIS — R059 Cough, unspecified: Secondary | ICD-10-CM | POA: Diagnosis not present

## 2024-03-08 DIAGNOSIS — I517 Cardiomegaly: Secondary | ICD-10-CM | POA: Diagnosis not present

## 2024-03-08 DIAGNOSIS — R0989 Other specified symptoms and signs involving the circulatory and respiratory systems: Secondary | ICD-10-CM | POA: Diagnosis not present

## 2024-03-09 DIAGNOSIS — M79671 Pain in right foot: Secondary | ICD-10-CM | POA: Diagnosis not present

## 2024-03-13 ENCOUNTER — Inpatient Hospital Stay

## 2024-03-13 ENCOUNTER — Encounter: Payer: Self-pay | Admitting: Oncology

## 2024-03-13 VITALS — BP 107/66 | HR 75 | Temp 96.8°F | Resp 20

## 2024-03-13 DIAGNOSIS — D631 Anemia in chronic kidney disease: Secondary | ICD-10-CM

## 2024-03-13 DIAGNOSIS — D500 Iron deficiency anemia secondary to blood loss (chronic): Secondary | ICD-10-CM

## 2024-03-13 DIAGNOSIS — N184 Chronic kidney disease, stage 4 (severe): Secondary | ICD-10-CM

## 2024-03-13 DIAGNOSIS — N186 End stage renal disease: Secondary | ICD-10-CM | POA: Diagnosis not present

## 2024-03-13 LAB — CBC
HCT: 30.8 % — ABNORMAL LOW (ref 36.0–46.0)
Hemoglobin: 9.7 g/dL — ABNORMAL LOW (ref 12.0–15.0)
MCH: 30.7 pg (ref 26.0–34.0)
MCHC: 31.5 g/dL (ref 30.0–36.0)
MCV: 97.5 fL (ref 80.0–100.0)
Platelets: 227 K/uL (ref 150–400)
RBC: 3.16 MIL/uL — ABNORMAL LOW (ref 3.87–5.11)
RDW: 16 % — ABNORMAL HIGH (ref 11.5–15.5)
WBC: 7.6 K/uL (ref 4.0–10.5)
nRBC: 0 % (ref 0.0–0.2)

## 2024-03-13 LAB — SAMPLE TO BLOOD BANK

## 2024-03-13 MED ORDER — EPOETIN ALFA-EPBX 10000 UNIT/ML IJ SOLN
10000.0000 [IU] | Freq: Once | INTRAMUSCULAR | Status: AC
  Administered 2024-03-13: 10:00:00 10000 [IU] via SUBCUTANEOUS
  Filled 2024-03-13: qty 1

## 2024-03-13 NOTE — Progress Notes (Signed)
 Patient tolerated injection with no complaints voiced.  Site clean and dry with no bruising or swelling noted at site.  See MAR for details.  Band aid applied.  Patient stable during and after injection.  Vss with discharge and left in satisfactory condition with no s/s of distress noted.

## 2024-03-13 NOTE — Telephone Encounter (Signed)
 Patient with diagnosis of afib on Eliquis  for anticoagulation.    Procedure: Esophagogastroduodenoscopy (EGD) w/ APC  Date of procedure: TBD   CHA2DS2-VASc Score =     This indicates a  % annual risk of stroke. The patient's score is based upon: CHF History: 1 HTN History: 1 Diabetes History: 1 Stroke History: 0 Age Score: 1 Gender Score: 1      CrCl 25 ml/min Platelet count 227  Patient has not had an Afib/aflutter ablation in the last 3 months, DCCV within the last 4 weeks or a watchman implanted in the last 45 days   Per office protocol, patient can hold Eliquis  for 2.5 days prior to procedure.    **This guidance is not considered finalized until pre-operative APP has relayed final recommendations.**

## 2024-03-13 NOTE — Progress Notes (Signed)
 SABRA

## 2024-03-13 NOTE — Telephone Encounter (Signed)
° °  Patient Name: [name]  DOB: [birth year] MRN: [id number]  Primary Cardiologist: Jayson Sierras, MD  Clinical pharmacists have reviewed the patient's past medical history, labs, and current medications as part of preoperative protocol coverage. The following recommendations have been made:  Patient with diagnosis of afib on Eliquis  for anticoagulation.     Procedure: Esophagogastroduodenoscopy (EGD) w/ APC  Date of procedure: TBD     CHA2DS2-VASc Score =     This indicates a  % annual risk of stroke. The patient's score is based upon: CHF History: 1 HTN History: 1 Diabetes History: 1 Stroke History: 0 Age Score: 1 Gender Score: 1       CrCl 25 ml/min Platelet count 227   Patient has not had an Afib/aflutter ablation in the last 3 months, DCCV within the last 4 weeks or a watchman implanted in the last 45 days    Per office protocol, patient can hold Eliquis  for 2.5 days prior to procedure.         I will route this recommendation to the requesting party via Epic fax function and remove from pre-op  pool.  Please call with questions.  Lamarr Satterfield, NP 03/13/2024, 1:23 PM

## 2024-03-13 NOTE — Patient Instructions (Signed)

## 2024-03-15 ENCOUNTER — Encounter: Payer: Self-pay | Admitting: *Deleted

## 2024-03-15 NOTE — Telephone Encounter (Signed)
 Pt has been scheduled for 04/12/24, instructions faxed to Chance Pierre, guardian, at [phone]

## 2024-03-20 ENCOUNTER — Inpatient Hospital Stay

## 2024-03-20 VITALS — BP 104/61 | HR 69 | Temp 96.3°F | Resp 16

## 2024-03-20 DIAGNOSIS — D500 Iron deficiency anemia secondary to blood loss (chronic): Secondary | ICD-10-CM

## 2024-03-20 DIAGNOSIS — N184 Chronic kidney disease, stage 4 (severe): Secondary | ICD-10-CM

## 2024-03-20 DIAGNOSIS — N186 End stage renal disease: Secondary | ICD-10-CM | POA: Diagnosis not present

## 2024-03-20 LAB — CBC
HCT: 26.9 % — ABNORMAL LOW (ref 36.0–46.0)
Hemoglobin: 8.5 g/dL — ABNORMAL LOW (ref 12.0–15.0)
MCH: 30.9 pg (ref 26.0–34.0)
MCHC: 31.6 g/dL (ref 30.0–36.0)
MCV: 97.8 fL (ref 80.0–100.0)
Platelets: 215 K/uL (ref 150–400)
RBC: 2.75 MIL/uL — ABNORMAL LOW (ref 3.87–5.11)
RDW: 16 % — ABNORMAL HIGH (ref 11.5–15.5)
WBC: 6.4 K/uL (ref 4.0–10.5)
nRBC: 0 % (ref 0.0–0.2)

## 2024-03-20 LAB — SAMPLE TO BLOOD BANK

## 2024-03-20 MED ORDER — EPOETIN ALFA-EPBX 10000 UNIT/ML IJ SOLN
10000.0000 [IU] | Freq: Once | INTRAMUSCULAR | Status: AC
  Administered 2024-03-20: 10000 [IU] via SUBCUTANEOUS
  Filled 2024-03-20: qty 1

## 2024-03-20 NOTE — Progress Notes (Signed)
 Patient tolerated injection with no complaints voiced.  Site clean and dry with no bruising or swelling noted at site.  See MAR for details.  Band aid applied.  Patient stable during and after injection.  Vss with discharge and left in satisfactory condition with no s/s of distress noted.

## 2024-03-20 NOTE — Patient Instructions (Signed)
 [facility] [place] - A DEPT OF [facility]  Discharge Instructions: Thank you for choosing [facility] to provide your oncology and hematology care.  If you have a lab appointment with the Cancer Center - please note that after April 8th, 2024, all labs will be drawn in the cancer center.  You do not have to check in or register with the main entrance as you have in the past but will complete your check-in in the cancer center.  Wear comfortable clothing and clothing appropriate for easy access to any Portacath or PICC line.   We strive to give you quality time with your provider. You may need to reschedule your appointment if you arrive late (15 or more minutes).  Arriving late affects you and other patients whose appointments are after yours.  Also, if you miss three or more appointments without notifying the office, you may be dismissed from the clinic at the provider's discretion.      For prescription refill requests, have your pharmacy contact our office and allow 72 hours for refills to be completed.    Today you received the following chemotherapy and/or immunotherapy agents retacrit      To help prevent nausea and vomiting after your treatment, we encourage you to take your nausea medication as directed.  BELOW ARE SYMPTOMS THAT SHOULD BE REPORTED IMMEDIATELY: *FEVER GREATER THAN 100.4 F (38 C) OR HIGHER *CHILLS OR SWEATING *NAUSEA AND VOMITING THAT IS NOT CONTROLLED WITH YOUR NAUSEA MEDICATION *UNUSUAL SHORTNESS OF BREATH *UNUSUAL BRUISING OR BLEEDING *URINARY PROBLEMS (pain or burning when urinating, or frequent urination) *BOWEL PROBLEMS (unusual diarrhea, constipation, pain near the anus) TENDERNESS IN MOUTH AND THROAT WITH OR WITHOUT PRESENCE OF ULCERS (sore throat, sores in mouth, or a toothache) UNUSUAL RASH, SWELLING OR PAIN  UNUSUAL VAGINAL DISCHARGE OR ITCHING   Items with * indicate a potential emergency and should be followed up  as soon as possible or go to the Emergency Department if any problems should occur.  Please show the CHEMOTHERAPY ALERT CARD or IMMUNOTHERAPY ALERT CARD at check-in to the Emergency Department and triage nurse.  Should you have questions after your visit or need to cancel or reschedule your appointment, please contact [facility]  - A DEPT OF [facility] [phone]  and follow the prompts.  Office hours are 8:00 a.m. to 4:30 p.m. Monday - Friday. Please note that voicemails left after 4:00 p.m. may not be returned until the following business day.  We are closed weekends and major holidays. You have access to a nurse at all times for urgent questions. Please call the main number to the clinic [phone] and follow the prompts.  For any non-urgent questions, you may also contact your provider using MyChart. We now offer e-Visits for anyone 81 and older to request care online for non-urgent symptoms. For details visit [email or web address].   Also download the MyChart app! Go to the app store, search "MyChart", open the app, select [facility], and log in with your MyChart username and password.

## 2024-03-21 ENCOUNTER — Other Ambulatory Visit: Payer: Self-pay

## 2024-03-21 DIAGNOSIS — E1122 Type 2 diabetes mellitus with diabetic chronic kidney disease: Secondary | ICD-10-CM

## 2024-03-21 MED ORDER — LANCETS 30G MISC: 1 refills | Status: AC

## 2024-03-27 ENCOUNTER — Encounter: Payer: Self-pay | Admitting: *Deleted

## 2024-03-27 ENCOUNTER — Inpatient Hospital Stay

## 2024-04-03 ENCOUNTER — Inpatient Hospital Stay

## 2024-04-03 ENCOUNTER — Inpatient Hospital Stay: Attending: Oncology

## 2024-04-03 ENCOUNTER — Ambulatory Visit (HOSPITAL_COMMUNITY)
Admission: RE | Admit: 2024-04-03 | Discharge: 2024-04-03 | Disposition: A | Discharge status: Home / Self Care | Source: Ambulatory Visit | Attending: Internal Medicine | Admitting: Internal Medicine

## 2024-04-03 VITALS — BP 108/48 | HR 72 | Temp 97.7°F | Resp 17

## 2024-04-03 DIAGNOSIS — E278 Other specified disorders of adrenal gland: Secondary | ICD-10-CM | POA: Insufficient documentation

## 2024-04-03 DIAGNOSIS — K766 Portal hypertension: Secondary | ICD-10-CM | POA: Diagnosis not present

## 2024-04-03 DIAGNOSIS — Z85110 Personal history of malignant carcinoid tumor of bronchus and lung: Secondary | ICD-10-CM | POA: Diagnosis not present

## 2024-04-03 DIAGNOSIS — Z1231 Encounter for screening mammogram for malignant neoplasm of breast: Secondary | ICD-10-CM | POA: Diagnosis present

## 2024-04-03 DIAGNOSIS — N185 Chronic kidney disease, stage 5: Secondary | ICD-10-CM | POA: Diagnosis present

## 2024-04-03 DIAGNOSIS — E538 Deficiency of other specified B group vitamins: Secondary | ICD-10-CM | POA: Insufficient documentation

## 2024-04-03 DIAGNOSIS — Z7901 Long term (current) use of anticoagulants: Secondary | ICD-10-CM | POA: Diagnosis not present

## 2024-04-03 DIAGNOSIS — D631 Anemia in chronic kidney disease: Secondary | ICD-10-CM | POA: Insufficient documentation

## 2024-04-03 DIAGNOSIS — Z08 Encounter for follow-up examination after completed treatment for malignant neoplasm: Secondary | ICD-10-CM | POA: Diagnosis not present

## 2024-04-03 DIAGNOSIS — I4891 Unspecified atrial fibrillation: Secondary | ICD-10-CM | POA: Insufficient documentation

## 2024-04-03 DIAGNOSIS — N184 Chronic kidney disease, stage 4 (severe): Secondary | ICD-10-CM

## 2024-04-03 DIAGNOSIS — D500 Iron deficiency anemia secondary to blood loss (chronic): Secondary | ICD-10-CM

## 2024-04-03 LAB — CBC
HCT: 27.9 % — ABNORMAL LOW (ref 36.0–46.0)
Hemoglobin: 8.8 g/dL — ABNORMAL LOW (ref 12.0–15.0)
MCH: 30.9 pg (ref 26.0–34.0)
MCHC: 31.5 g/dL (ref 30.0–36.0)
MCV: 97.9 fL (ref 80.0–100.0)
Platelets: 229 K/uL (ref 150–400)
RBC: 2.85 MIL/uL — ABNORMAL LOW (ref 3.87–5.11)
RDW: 15.4 % (ref 11.5–15.5)
WBC: 4.9 K/uL (ref 4.0–10.5)
nRBC: 0 % (ref 0.0–0.2)

## 2024-04-03 LAB — SAMPLE TO BLOOD BANK

## 2024-04-03 MED ORDER — EPOETIN ALFA-EPBX 10000 UNIT/ML IJ SOLN
10000.0000 [IU] | Freq: Once | INTRAMUSCULAR | Status: AC
  Administered 2024-04-03: 10000 [IU] via SUBCUTANEOUS
  Filled 2024-04-03: qty 1

## 2024-04-03 NOTE — Progress Notes (Signed)
 Patient's Hgb 8.8 and blood pressure stable. Patient tolerated Retacrit  injection with no complaints voiced.  Site clean and dry with no bruising or swelling noted at site.  See MAR for details.  Band aid applied.  Patient stable during and after injection.  Vss with discharge and left in satisfactory condition with no s/s of distress noted. All follow ups as scheduled.  [name] [employer]

## 2024-04-07 ENCOUNTER — Encounter (HOSPITAL_COMMUNITY): Admission: RE | Admit: 2024-04-07 | Source: Ambulatory Visit

## 2024-04-07 ENCOUNTER — Other Ambulatory Visit: Payer: Self-pay

## 2024-04-07 ENCOUNTER — Encounter (HOSPITAL_COMMUNITY): Payer: Self-pay

## 2024-04-07 NOTE — Pre-Procedure Instructions (Signed)
 Faxed EGD instructions to pt's social worker Chance Pierre per her request at [phone]. She states she will get them to [facility] where the patient lives.

## 2024-04-10 ENCOUNTER — Inpatient Hospital Stay

## 2024-04-10 VITALS — BP 104/60 | HR 71 | Temp 96.9°F | Resp 16

## 2024-04-10 DIAGNOSIS — E538 Deficiency of other specified B group vitamins: Secondary | ICD-10-CM

## 2024-04-10 DIAGNOSIS — D500 Iron deficiency anemia secondary to blood loss (chronic): Secondary | ICD-10-CM

## 2024-04-10 DIAGNOSIS — D631 Anemia in chronic kidney disease: Secondary | ICD-10-CM

## 2024-04-10 DIAGNOSIS — D3A090 Benign carcinoid tumor of the bronchus and lung: Secondary | ICD-10-CM

## 2024-04-10 DIAGNOSIS — N184 Chronic kidney disease, stage 4 (severe): Secondary | ICD-10-CM

## 2024-04-10 DIAGNOSIS — N185 Chronic kidney disease, stage 5: Secondary | ICD-10-CM | POA: Diagnosis not present

## 2024-04-10 LAB — CBC WITH DIFFERENTIAL/PLATELET
Abs Immature Granulocytes: 0.08 K/uL — ABNORMAL HIGH (ref 0.00–0.07)
Basophils Absolute: 0.1 K/uL (ref 0.0–0.1)
Basophils Relative: 1 %
Eosinophils Absolute: 0.3 K/uL (ref 0.0–0.5)
Eosinophils Relative: 4 %
HCT: 30.5 % — ABNORMAL LOW (ref 36.0–46.0)
Hemoglobin: 9.7 g/dL — ABNORMAL LOW (ref 12.0–15.0)
Immature Granulocytes: 1 %
Lymphocytes Relative: 13 %
Lymphs Abs: 0.9 K/uL (ref 0.7–4.0)
MCH: 31.2 pg (ref 26.0–34.0)
MCHC: 31.8 g/dL (ref 30.0–36.0)
MCV: 98.1 fL (ref 80.0–100.0)
Monocytes Absolute: 0.7 K/uL (ref 0.1–1.0)
Monocytes Relative: 10 %
Neutro Abs: 5.1 K/uL (ref 1.7–7.7)
Neutrophils Relative %: 71 %
Platelets: 306 K/uL (ref 150–400)
RBC: 3.11 MIL/uL — ABNORMAL LOW (ref 3.87–5.11)
RDW: 15.9 % — ABNORMAL HIGH (ref 11.5–15.5)
WBC: 7.1 K/uL (ref 4.0–10.5)
nRBC: 0 % (ref 0.0–0.2)

## 2024-04-10 LAB — COMPREHENSIVE METABOLIC PANEL WITH GFR
ALT: 8 U/L (ref 0–44)
AST: 18 U/L (ref 15–41)
Albumin: 4.2 g/dL (ref 3.5–5.0)
Alkaline Phosphatase: 77 U/L (ref 38–126)
Anion gap: 15 (ref 5–15)
BUN: 38 mg/dL — ABNORMAL HIGH (ref 8–23)
CO2: 23 mmol/L (ref 22–32)
Calcium: 9.3 mg/dL (ref 8.9–10.3)
Chloride: 98 mmol/L (ref 98–111)
Creatinine, Ser: 1.77 mg/dL — ABNORMAL HIGH (ref 0.44–1.00)
GFR, Estimated: 31 mL/min — ABNORMAL LOW
Glucose, Bld: 192 mg/dL — ABNORMAL HIGH (ref 70–99)
Potassium: 4.1 mmol/L (ref 3.5–5.1)
Sodium: 136 mmol/L (ref 135–145)
Total Bilirubin: 0.4 mg/dL (ref 0.0–1.2)
Total Protein: 7.3 g/dL (ref 6.5–8.1)

## 2024-04-10 LAB — SAMPLE TO BLOOD BANK

## 2024-04-10 LAB — IRON AND TIBC
Iron: 44 ug/dL (ref 28–170)
Saturation Ratios: 17 % (ref 10.4–31.8)
TIBC: 269 ug/dL (ref 250–450)
UIBC: 224 ug/dL

## 2024-04-10 LAB — VITAMIN B12: Vitamin B-12: 1725 pg/mL — ABNORMAL HIGH (ref 180–914)

## 2024-04-10 LAB — FERRITIN: Ferritin: 259 ng/mL (ref 11–307)

## 2024-04-10 MED ORDER — EPOETIN ALFA-EPBX 10000 UNIT/ML IJ SOLN
10000.0000 [IU] | Freq: Once | INTRAMUSCULAR | Status: AC
  Administered 2024-04-10: 10000 [IU] via SUBCUTANEOUS
  Filled 2024-04-10: qty 1

## 2024-04-10 NOTE — Progress Notes (Signed)
 Hgb 9.7 and blood pressure stable. Patient tolerated Retacrit injection with no complaints voiced.  Site clean and dry with no bruising or swelling noted at site.  See MAR for details.  Band aid applied.  Patient stable during and after injection.  Vss with discharge and left in satisfactory condition with no s/s of distress noted. All follow ups as scheduled.   Yusef Lamp [employer]

## 2024-04-12 ENCOUNTER — Encounter (HOSPITAL_COMMUNITY): Payer: Self-pay | Admitting: Internal Medicine

## 2024-04-12 ENCOUNTER — Encounter (HOSPITAL_COMMUNITY): Payer: Self-pay | Admitting: Anesthesiology

## 2024-04-12 ENCOUNTER — Other Ambulatory Visit: Payer: Self-pay

## 2024-04-12 ENCOUNTER — Ambulatory Visit (HOSPITAL_COMMUNITY): Payer: Self-pay | Admitting: Anesthesiology

## 2024-04-12 ENCOUNTER — Encounter (HOSPITAL_COMMUNITY)
Admission: RE | Disposition: A | Discharge status: Skilled Nursing Facility | Payer: Self-pay | Source: Home / Self Care | Attending: Internal Medicine

## 2024-04-12 ENCOUNTER — Ambulatory Visit (HOSPITAL_COMMUNITY)
Admission: RE | Admit: 2024-04-12 | Discharge: 2024-04-12 | Disposition: A | Discharge status: Skilled Nursing Facility | Attending: Internal Medicine | Admitting: Internal Medicine

## 2024-04-12 DIAGNOSIS — E1122 Type 2 diabetes mellitus with diabetic chronic kidney disease: Secondary | ICD-10-CM | POA: Diagnosis not present

## 2024-04-12 DIAGNOSIS — I2510 Atherosclerotic heart disease of native coronary artery without angina pectoris: Secondary | ICD-10-CM | POA: Insufficient documentation

## 2024-04-12 DIAGNOSIS — K31819 Angiodysplasia of stomach and duodenum without bleeding: Secondary | ICD-10-CM | POA: Insufficient documentation

## 2024-04-12 DIAGNOSIS — K3189 Other diseases of stomach and duodenum: Secondary | ICD-10-CM | POA: Insufficient documentation

## 2024-04-12 DIAGNOSIS — Z87891 Personal history of nicotine dependence: Secondary | ICD-10-CM | POA: Diagnosis not present

## 2024-04-12 DIAGNOSIS — K766 Portal hypertension: Secondary | ICD-10-CM | POA: Diagnosis not present

## 2024-04-12 DIAGNOSIS — G4733 Obstructive sleep apnea (adult) (pediatric): Secondary | ICD-10-CM | POA: Diagnosis not present

## 2024-04-12 DIAGNOSIS — R195 Other fecal abnormalities: Secondary | ICD-10-CM | POA: Diagnosis present

## 2024-04-12 DIAGNOSIS — J4489 Other specified chronic obstructive pulmonary disease: Secondary | ICD-10-CM | POA: Insufficient documentation

## 2024-04-12 DIAGNOSIS — Z79899 Other long term (current) drug therapy: Secondary | ICD-10-CM | POA: Insufficient documentation

## 2024-04-12 DIAGNOSIS — D509 Iron deficiency anemia, unspecified: Secondary | ICD-10-CM | POA: Diagnosis not present

## 2024-04-12 DIAGNOSIS — Z7901 Long term (current) use of anticoagulants: Secondary | ICD-10-CM | POA: Diagnosis not present

## 2024-04-12 DIAGNOSIS — Z6841 Body Mass Index (BMI) 40.0 and over, adult: Secondary | ICD-10-CM | POA: Insufficient documentation

## 2024-04-12 DIAGNOSIS — E6689 Other obesity not elsewhere classified: Secondary | ICD-10-CM | POA: Diagnosis not present

## 2024-04-12 DIAGNOSIS — R011 Cardiac murmur, unspecified: Secondary | ICD-10-CM | POA: Insufficient documentation

## 2024-04-12 DIAGNOSIS — I130 Hypertensive heart and chronic kidney disease with heart failure and stage 1 through stage 4 chronic kidney disease, or unspecified chronic kidney disease: Secondary | ICD-10-CM | POA: Diagnosis not present

## 2024-04-12 DIAGNOSIS — N183 Chronic kidney disease, stage 3 unspecified: Secondary | ICD-10-CM | POA: Insufficient documentation

## 2024-04-12 HISTORY — PX: HOT HEMOSTASIS: SHX5433

## 2024-04-12 LAB — GLUCOSE, CAPILLARY: Glucose-Capillary: 102 mg/dL — ABNORMAL HIGH (ref 70–99)

## 2024-04-12 LAB — CHROMOGRANIN A: Chromogranin A (ng/mL): 1086 ng/mL — ABNORMAL HIGH (ref 0.0–101.8)

## 2024-04-12 SURGERY — EGD, WITH ARGON PLASMA COAGULATION
Anesthesia: Monitor Anesthesia Care

## 2024-04-12 MED ORDER — STERILE WATER FOR IRRIGATION IR SOLN
Status: DC | PRN
  Administered 2024-04-12: 60 mL

## 2024-04-12 MED ORDER — LIDOCAINE HCL 1 % IJ SOLN
INTRAMUSCULAR | Status: DC | PRN
  Administered 2024-04-12: 60 mg via INTRADERMAL

## 2024-04-12 MED ORDER — PROPOFOL 10 MG/ML IV BOLUS
INTRAVENOUS | Status: DC | PRN
  Administered 2024-04-12 (×2): 20 mg via INTRAVENOUS
  Administered 2024-04-12: 80 mg via INTRAVENOUS
  Administered 2024-04-12: 40 mg via INTRAVENOUS
  Administered 2024-04-12: 20 mg via INTRAVENOUS

## 2024-04-12 MED ORDER — LACTATED RINGERS IV SOLN: INTRAVENOUS | Status: DC

## 2024-04-12 NOTE — Anesthesia Postprocedure Evaluation (Signed)
"   Anesthesia Post Note  Patient: [name]  Procedure(s) Performed: EGD, WITH ARGON PLASMA COAGULATION  Patient location during evaluation: Short Stay Anesthesia Type: MAC Level of consciousness: awake and alert Pain management: pain level controlled Vital Signs Assessment: post-procedure vital signs reviewed and stable Respiratory status: spontaneous breathing Cardiovascular status: blood pressure returned to baseline Postop Assessment: no apparent nausea or vomiting Anesthetic complications: no   No notable events documented.   Last Vitals:  Vitals:   04/12/24 0642 04/12/24 0828  BP: 123/69 (!) 116/53  Pulse: 69 72  Resp: 12   Temp: 36.5 C 36.6 C  SpO2: 99% 100%    Last Pain:  Vitals:   04/12/24 0828  TempSrc: Axillary  PainSc: 0-No pain                 [name]      "

## 2024-04-12 NOTE — Discharge Instructions (Addendum)
 EGD Discharge instructions Please read the instructions outlined below and refer to this sheet in the next few weeks. These discharge instructions provide you with general information on caring for yourself after you leave the hospital. Your doctor may also give you specific instructions. While your treatment has been planned according to the most current medical practices available, unavoidable complications occasionally occur. If you have any problems or questions after discharge, please call your doctor. ACTIVITY You may resume your regular activity but move at a slower pace for the next 24 hours.  Take frequent rest periods for the next 24 hours.  Walking will help expel (get rid of) the air and reduce the bloated feeling in your abdomen.  No driving for 24 hours (because of the anesthesia (medicine) used during the test).  You may shower.  Do not sign any important legal documents or operate any machinery for 24 hours (because of the anesthesia used during the test).  NUTRITION Drink plenty of fluids.  You may resume your normal diet.  Begin with a light meal and progress to your normal diet.  Avoid alcoholic beverages for 24 hours or as instructed by your caregiver.  MEDICATIONS You may resume your normal medications unless your caregiver tells you otherwise.  WHAT YOU CAN EXPECT TODAY You may experience abdominal discomfort such as a feeling of fullness or gas pains.  FOLLOW-UP Your doctor will discuss the results of your test with you.  SEEK IMMEDIATE MEDICAL ATTENTION IF ANY OF THE FOLLOWING OCCUR: Excessive nausea (feeling sick to your stomach) and/or vomiting.  Severe abdominal pain and distention (swelling).  Trouble swallowing.  Temperature over 101 F (37.8 C).  Rectal bleeding or vomiting of blood.     Abnormal blood vessels (GAVE) treated once again today.  From a GI standpoint if still clinically indicated, can resume her Eliquis  today.  However, agree with  reassessing the need of Eliquis  for the long-term given recurrent GI blood loss  At patient request, I called Chance Pierre at [phone]-[id number] findings and recommendations

## 2024-04-12 NOTE — Transfer of Care (Signed)
 Immediate Anesthesia Transfer of Care Note  Patient: [name]  Procedure(s) Performed: EGD, WITH ARGON PLASMA COAGULATION  Patient Location: Short Stay  Anesthesia Type:General  Level of Consciousness: awake  Airway & Oxygen  Therapy: Patient Spontanous Breathing  Post-op Assessment: Report given to RN  Post vital signs: Reviewed and stable  Last Vitals:  Vitals Value Taken Time  BP    Temp    Pulse    Resp    SpO2      Last Pain:  Vitals:   04/12/24 0807  TempSrc:   PainSc: 0-No pain         Complications: No notable events documented.

## 2024-04-12 NOTE — Anesthesia Preprocedure Evaluation (Signed)
"                                    Anesthesia Evaluation  Patient identified by MRN, date of birth, ID band Patient awake    Reviewed: Allergy & Precautions, H&P , NPO status , Patient's Chart, lab work & pertinent test results, reviewed documented beta blocker date and time   Airway Mallampati: II  TM Distance: >3 FB Neck ROM: full    Dental no notable dental hx.    Pulmonary neg pulmonary ROS, asthma , sleep apnea , pneumonia, COPD, former smoker   Pulmonary exam normal breath sounds clear to auscultation       Cardiovascular Exercise Tolerance: Good hypertension, + CAD and +CHF  negative cardio ROS + Valvular Problems/Murmurs  Rhythm:regular Rate:Normal     Neuro/Psych negative neurological ROS  negative psych ROS   GI/Hepatic negative GI ROS, Neg liver ROS,GERD  ,,  Endo/Other  diabetes  Class 4 obesity  Renal/GU Renal diseasenegative Renal ROS  negative genitourinary   Musculoskeletal   Abdominal   Peds  Hematology negative hematology ROS (+) Blood dyscrasia, anemia   Anesthesia Other Findings   Reproductive/Obstetrics negative OB ROS                              Anesthesia Physical Anesthesia Plan  ASA: 3  Anesthesia Plan: MAC   Post-op Pain Management:    Induction:   PONV Risk Score and Plan: Propofol  infusion  Airway Management Planned:   Additional Equipment:   Intra-op Plan:   Post-operative Plan:   Informed Consent: I have reviewed the patients History and Physical, chart, labs and discussed the procedure including the risks, benefits and alternatives for the proposed anesthesia with the patient or authorized representative who has indicated his/her understanding and acceptance.     Dental Advisory Given  Plan Discussed with: CRNA  Anesthesia Plan Comments:         Anesthesia Quick Evaluation  "

## 2024-04-12 NOTE — Op Note (Addendum)
 [facility] Patient Name: [name] Procedure Date: 04/12/2024 7:50 AM MRN: [id number] Date of Birth: [birth year] Attending MD: Lamar Ozell Hollingshead , MD, [id number] CSN: [id number] Age: 69 Admit Type: Outpatient Procedure:                Upper GI endoscopy Indications:              Occult blood in stool Providers:                Lamar Ozell Hollingshead, MD, Devere Lodge, Daphne Mulch                            Technician, Technician Referring MD:              Medicines:                Propofol  per Anesthesia Complications:            No immediate complications. Estimated Blood Loss:     Estimated blood loss: none. Estimated blood loss:                            none. Procedure:                Pre-Anesthesia Assessment:                           - Prior to the procedure, a History and Physical                            was performed, and patient medications and                            allergies were reviewed. The patient's tolerance of                            previous anesthesia was also reviewed. The risks                            and benefits of the procedure and the sedation                            options and risks were discussed with the patient.                            All questions were answered, and informed consent                            was obtained. Prior Anticoagulants: The patient                            last took Eliquis  (apixaban ) 5 days prior to the                            procedure. ASA Grade Assessment: III - A patient  with severe systemic disease. After reviewing the                            risks and benefits, the patient was deemed in                            satisfactory condition to undergo the procedure.                           After obtaining informed consent, the endoscope was                            passed under direct vision. Throughout the                            procedure, the patient's  blood pressure, pulse, and                            oxygen  saturations were monitored continuously. The                            [id number] ([id number]) Upper was introduced through                            the mouth, and advanced to the second part of                            duodenum. The upper GI endoscopy was accomplished                            without difficulty. The patient tolerated the                            procedure well. Scope In: 8:13:50 AM Scope Out: 8:21:35 AM Total Procedure Duration: 0 hours 7 minutes 45 seconds  Findings:      The examined esophagus was normal.      Mild portal hypertensive gastropathy was found in the entire examined       stomach. Mild vascular ectatic areas in the antrum (GAVE). Looked less       prominent than previously seen.      The duodenal bulb and second portion of the duodenum were normal.       Utilizing circular APC probe ectatic areas ablated strategically. This       was done without difficulty or apparent complication. Impression:               - Normal esophagus.                           - Portal hypertensive gastropathy. GAVE?mild?status                            post APC ablation                           - Normal duodenal bulb and second  portion of the                            duodenum.                           -- No specimens collected. Because of technical                            difficulties with the computer images (which are in                            the computer) could not be brought over into the                            report. Moderate Sedation:      Moderate (conscious) sedation was personally administered by an       anesthesia professional. The following parameters were monitored: oxygen        saturation, heart rate, blood pressure, respiratory rate, EKG, adequacy       of pulmonary ventilation, and response to care. Recommendation:           - Patient has a contact number available for                             emergencies. The signs and symptoms of potential                            delayed complications were discussed with the                            patient. Return to normal activities tomorrow.                            Written discharge instructions were provided to the                            patient.                           - Advance diet as tolerated.                           - Continue present medications. Going forward,                            would reassess the need for ongoing anticoagulation                            therapy with Eliquis . Procedure Code(s):        --- Professional ---                           [phone], Esophagogastroduodenoscopy, flexible,                            transoral; diagnostic, including  collection of                            specimen(s) by brushing or washing, when performed                            (separate procedure) Diagnosis Code(s):        --- Professional ---                           K76.6, Portal hypertension                           K31.89, Other diseases of stomach and duodenum                           R19.5, Other fecal abnormalities CPT copyright 2022 American [employer] Association. All rights reserved. The codes documented in this report are preliminary and upon coder review may  be revised to meet current compliance requirements. Lamar HERO. Jarissa Sheriff, MD Lamar Ozell Hollingshead, MD 04/12/2024 8:33:39 AM This report has been signed electronically. Number of Addenda: 0

## 2024-04-12 NOTE — H&P (Signed)
 @LOGO @   Gastroenterology Progress Note    Primary Care Physician:  [name], Tesfaye Demissie, MD Primary Gastroenterologist:  Dr. Shaaron  Pre-Procedure History & Physical: HPI:  [name] is a 69 y.o. female here for with a multiple comorbidities including GAVE slow GI bleed requiring parenteral iron  here for repeat EGD with treatment of GAVE as feasible/appropriate today.  Past Medical History:  Diagnosis Date   Arthritis    Asthma    Atrial fibrillation (HCC)    Carcinoid tumor determined by biopsy of lung (HCC) 05/2018   Left lung   Chronic diastolic CHF (congestive heart failure) (HCC)    CKD (chronic kidney disease), stage III (HCC)    Diabetes mellitus type 2 in obese    Essential hypertension    GERD (gastroesophageal reflux disease)    Heart murmur    Iron  deficiency anemia 10/16/2010   Mental handicap 10/16/2010   Mild CAD 2013   Morbid obesity (HCC)    OSA (obstructive sleep apnea)     Past Surgical History:  Procedure Laterality Date   ABDOMINAL HYSTERECTOMY     AV FISTULA PLACEMENT Right 06/06/2018   Procedure: ARTERIOVENOUS (AV) FISTULA CREATION;  Surgeon: Gretta Lonni PARAS, MD;  Location: [facility] OR;  Service: Vascular;  Laterality: Right;   BIOPSY  10/16/2021   Procedure: BIOPSY;  Surgeon: Shaaron Lamar HERO, MD;  Location: [facility];  Service: Endoscopy;;   BRONCHIAL DILITATION  12/13/2020   Procedure: BRONCHIAL DILITATION;  Surgeon: Brenna Adine CROME, DO;  Location: [facility] ENDOSCOPY;  Service: Pulmonary;;   CESAREAN SECTION     CHOLECYSTECTOMY     COLONOSCOPY  08/2010   normal TI, sigmoid polyp (adenoma ). Next TCS due  08/2015,   COLONOSCOPY N/A 10/06/2023   Procedure: COLONOSCOPY;  Surgeon: Shaaron Lamar HERO, MD;  Location: [facility];  Service: Endoscopy;  Laterality: N/A;  1:00pm, asa 3,a t [place]   COLONOSCOPY WITH PROPOFOL  N/A 07/08/2020   Procedure: COLONOSCOPY WITH PROPOFOL ;  Surgeon: Shaaron Lamar HERO, MD;  Location: [facility];  Service:  Endoscopy;  Laterality: N/A;  AM (diabetic and facility patient)   ESOPHAGOGASTRODUODENOSCOPY  08/2010   antral and duodenal erosions s/p bx (chronic gastritis, no h.pylori, no celiac dz ), hiatal hernia   ESOPHAGOGASTRODUODENOSCOPY N/A 10/06/2023   Procedure: EGD (ESOPHAGOGASTRODUODENOSCOPY);  Surgeon: Shaaron Lamar HERO, MD;  Location: [facility];  Service: Endoscopy;  Laterality: N/A;   ESOPHAGOGASTRODUODENOSCOPY (EGD) WITH PROPOFOL  N/A 10/16/2021   Procedure: ESOPHAGOGASTRODUODENOSCOPY (EGD) WITH PROPOFOL ;  Surgeon: Shaaron Lamar HERO, MD;  Location: [facility];  Service: Endoscopy;  Laterality: N/A;  11:45am   ESOPHAGOGASTRODUODENOSCOPY (EGD) WITH PROPOFOL  N/A 12/18/2021   Procedure: ESOPHAGOGASTRODUODENOSCOPY (EGD) WITH PROPOFOL ;  Surgeon: Cindie Carlin POUR, DO;  Location: [facility];  Service: Endoscopy;  Laterality: N/A;   ESOPHAGOGASTRODUODENOSCOPY (EGD) WITH PROPOFOL  N/A 01/19/2023   Procedure: ESOPHAGOGASTRODUODENOSCOPY (EGD) WITH PROPOFOL ;  Surgeon: Eartha Angelia Sieving, MD;  Location: [facility];  Service: Gastroenterology;  Laterality: N/A;  2:30 pm, asa 3   FISTULA SUPERFICIALIZATION Right 08/25/2018   Procedure: FISTULA SUPERFICIALIZATION RIGHT ARM;  Surgeon: Sheree Penne Lonni, MD;  Location: [facility] OR;  Service: Cardiovascular;  Laterality: Right;   HOT HEMOSTASIS  12/18/2021   Procedure: HOT HEMOSTASIS (ARGON PLASMA COAGULATION/BICAP);  Surgeon: Cindie Carlin POUR, DO;  Location: [facility];  Service: Endoscopy;;   HOT HEMOSTASIS  01/19/2023   Procedure: HOT HEMOSTASIS (ARGON PLASMA COAGULATION/BICAP);  Surgeon: Eartha Angelia, Sieving, MD;  Location: [facility];  Service: Gastroenterology;;   IR FLUORO GUIDE CV LINE RIGHT  05/31/2018   IR US  GUIDE VASC ACCESS RIGHT  05/31/2018   KNEE SURGERY     right knee @ 69 years of age   LEFT AND RIGHT HEART CATHETERIZATION WITH CORONARY ANGIOGRAM N/A 09/21/2011   Procedure: LEFT AND RIGHT HEART CATHETERIZATION WITH CORONARY  ANGIOGRAM;  Surgeon: Salena GORMAN Negri, MD;  Location: [facility];  Service: Cardiovascular;  Laterality: N/A;   POLYPECTOMY  07/08/2020   Procedure: POLYPECTOMY INTESTINAL;  Surgeon: Shaaron Lamar HERO, MD;  Location: [facility];  Service: Endoscopy;;   VIDEO BRONCHOSCOPY Left 12/13/2020   Procedure: VIDEO BRONCHOSCOPY WITHOUT FLUORO;  Surgeon: Brenna Adine CROME, DO;  Location: [facility];  Service: Pulmonary;  Laterality: Left;  Cryotherapy    Prior to Admission medications  Medication Sig Start Date End Date Taking? Authorizing Provider  acetaminophen  (TYLENOL ) 325 MG tablet Take 325 mg by mouth every 12 (twelve) hours as needed for moderate pain (pain score 4-6).   Yes [provider]  albuterol  (PROVENTIL  HFA;VENTOLIN  HFA) 108 (90 Base) MCG/ACT inhaler Inhale 2 puffs into the lungs every 4 (four) hours as needed for shortness of breath.   Yes [provider]  apixaban  (ELIQUIS ) 5 MG TABS tablet Take 1 tablet (5 mg total) by mouth 2 (two) times daily. 12/20/21  Yes Pearlean Manus, MD  calcitRIOL  (ROCALTROL ) 0.25 MCG capsule Take 0.25 mcg by mouth every Monday, Wednesday, and Friday. 08/08/20  Yes [provider]  cetirizine  (ZYRTEC ) 10 MG tablet Take 10 mg by mouth daily. 06/06/19  Yes [provider]  Cholecalciferol  (VITAMIN D3) 50 MCG (2000 UT) TABS TAKE 1 TABLET BY MOUTH ONCE DAILY. 01/02/21  Yes Nida, Gebreselassie W, MD  cyanocobalamin  (VITAMIN B12) 1000 MCG tablet Take 1 tablet (1,000 mcg total) by mouth every other day. 02/23/24  Yes Pennington, Rebekah M, PA-C  diltiazem  (CARDIZEM  CD) 240 MG 24 hr capsule Take 1 capsule (240 mg total) by mouth daily. 06/10/18  Yes Rojelio Nest, DO  docusate sodium (COLACE) 100 MG capsule Take 100 mg by mouth 2 (two) times daily as needed for mild constipation.   Yes [provider]  ferrous sulfate  325 (65 FE) MG tablet Take 1 tablet (325 mg total) by mouth every other day. 02/10/23  Yes Davonna Siad, MD   Finerenone  (KERENDIA ) 10 MG TABS Take 10 mg by mouth daily.   Yes [provider]  folic acid  (FOLVITE ) 1 MG tablet Take 1 tablet (1 mg total) by mouth daily. 04/27/23  Yes Pennington, Pleasant HERO, PA-C  furosemide  (LASIX ) 20 MG tablet TAKE 1 TABLET BY MOUTH EVERY OTHER DAY,ALTERNATE WITH 40 MG TABLET. 08/30/23  Yes Miriam Norris, NP  furosemide  (LASIX ) 40 MG tablet TAKE 1 TABLET BY MOUTH EVERY OTHER DAY,ALTERNATE WITH 20 MG TABLET. 07/30/23  Yes Debera Jayson MATSU, MD  GERI-TUSSIN DM 10-100 MG/5ML liquid Take by mouth. 12/16/21  Yes [provider]  insulin  regular human CONCENTRATED (HUMULIN  R U-500 KWIKPEN) 500 UNIT/ML KwikPen Inject 50 Units into the skin 2 (two) times daily with a meal. INJECT 50 UNITS SUBCUTANEOUSLY AT BREAKFAST, 50 UNITS AT SUPPER; WHEN GLUCOSE IS ABOVE 90 & EATING.(HOLD IF BS BELOW 70: CALL MD IF BS ABOVE 400) 12/16/23  Yes Nida, Gebreselassie W, MD  ipratropium-albuterol  (DUONEB) 0.5-2.5 (3) MG/3ML SOLN Take 3 mLs by nebulization every 6 (six) hours as needed (for cough).   Yes [provider]  isosorbide  dinitrate (ISORDIL ) 10 MG tablet Take 1  tablet (10 mg total) by mouth 3 (three) times daily. 06/09/18  Yes Rojelio Nest, DO  lansoprazole  (PREVACID ) 30 MG capsule Take 1 capsule (30 mg total) by mouth 2 (two) times daily before a meal. 03/07/24  Yes Vandy Tsuchiya, Lamar HERO, MD  losartan  (COZAAR ) 25 MG tablet Take 12.5 mg by mouth daily.   Yes [provider]  metolazone (ZAROXOLYN) 5 MG tablet Take 5 mg by mouth in the morning.   Yes [provider]  metoprolol  tartrate (LOPRESSOR ) 50 MG tablet Take 50 mg by mouth 2 (two) times daily. 05/08/20  Yes [provider]  montelukast  (SINGULAIR ) 10 MG tablet Take 10 mg by mouth at bedtime.   Yes [provider]  potassium chloride  SA (KLOR-CON  M) 20 MEQ tablet Take 20 mEq by mouth daily. 09/11/19  Yes [provider]  rosuvastatin  (CRESTOR ) 10 MG tablet Take 10 mg by mouth at  bedtime. 09/29/23  Yes [provider]  TRELEGY ELLIPTA 100-62.5-25 MCG/ACT AEPB Inhale 1 puff into the lungs daily. 12/14/22  Yes [provider]  DROPSAFE SAFETY PEN NEEDLES 31G X 6 MM MISC  06/17/23   [provider]  EASYMAX TEST test strip CHECK BLOOD SUGAR 4 TIMES A DAY. (BREAKFAST, LUNCH, SUPPER & BEDTIME) 02/08/24   Nida, Gebreselassie W, MD  fluticasone  (FLONASE ) 50 MCG/ACT nasal spray Place 2 sprays into both nostrils daily as needed for allergies.    [provider]  hydrOXYzine (ATARAX) 10 MG tablet Take 10 mg by mouth 3 (three) times daily as needed. 06/07/23   [provider]  Lancets 30G MISC CHECK BLOOD SUGAR 4 TIMES A DAY. (BREAKFAST, LUNCH, SUPPER, & BEDTIME) 03/21/24   Nida, Gebreselassie W, MD  loperamide (IMODIUM) 2 MG capsule Take 2-4 mg by mouth See admin instructions. Take 4 mg by mouth now: then 2 mg after each loose stool as needed, do not exceed 5 doses in 24 hours.    [provider]  polyethylene glycol powder (GLYCOLAX /MIRALAX ) 17 GM/SCOOP powder Take 17 g by mouth daily as needed for mild constipation or moderate constipation.    [provider]  triamcinolone cream (KENALOG) 0.5 % Apply 1 application  topically every 12 (twelve) hours as needed (applied to bilateral legs for allergic dermatitis).    [provider]    Allergies as of 03/15/2024   (No Known Allergies)    Family History  Problem Relation Age of Onset   Diabetes Mother    Hypertension Mother    Heart failure Mother    Hypertension Father    Atrial fibrillation Father    Hypertension Son    Kidney disease Other        son reportedly has had cyst on kidney and lung s/p surgery at [facility]   Colon cancer Neg Hx     Social History   Socioeconomic History   Marital status: Divorced    Spouse name: Not on file   Number of children: 1   Years of education: Not on file   Highest education level: Not on file  Occupational History     Employer: UNEMPLOYED  Tobacco Use   Smoking status: Former    Current packs/day: 0.00    Average packs/day: 1.0 packs/day    Types: Cigarettes    Start date: 04/11/1996    Quit date: 04/11/1996    Years since quitting: 28.0   Smokeless tobacco: Never   Tobacco comments:    quit a couple year ago  Vaping Use  Vaping status: Never Used  Substance and Sexual Activity   Alcohol  use: No   Drug use: No   Sexual activity: Never  Other Topics Concern   Not on file  Social History Narrative   Lives with parents.    Social Drivers of Health   Tobacco Use: Medium Risk (04/12/2024)   Patient History    Smoking Tobacco Use: Former    Smokeless Tobacco Use: Never    Passive Exposure: Not on file  Financial Resource Strain: Not on file  Food Insecurity: No Food Insecurity (12/17/2021)   Hunger Vital Sign    Worried About Running Out of Food in the Last Year: Never true    Ran Out of Food in the Last Year: Never true  Transportation Needs: No Transportation Needs (12/17/2021)   PRAPARE - Administrator, Civil Service (Medical): No    Lack of Transportation (Non-Medical): No  Physical Activity: Not on file  Stress: Not on file  Social Connections: Not on file  Intimate Partner Violence: Not At Risk (12/17/2021)   Humiliation, Afraid, Rape, and Kick questionnaire    Fear of Current or Ex-Partner: No    Emotionally Abused: No    Physically Abused: No    Sexually Abused: No  Depression (PHQ2-9): Low Risk (02/23/2024)   Depression (PHQ2-9)    PHQ-2 Score: 0  Alcohol  Screen: Not on file  Housing: Low Risk (12/17/2021)   Housing    Last Housing Risk Score: 0  Utilities: Not At Risk (12/17/2021)   [facility] Utilities    Threatened with loss of utilities: No  Health Literacy: Not on file    Review of Systems   See HPI, otherwise negative ROS  Physical Exam: BP 123/69   Pulse 69   Temp 97.7 F (36.5 C) (Oral)   Resp 12   Ht 5' 2 (1.575 m)   Wt 105.1 kg   SpO2 99%    BMI 42.38 kg/m  General:   Alert,  Well-developed, well-nourished, pleasant and cooperative in NAD Neck:  Supple; no masses or thyromegaly. No significant cervical adenopathy. Lungs:  Clear throughout to auscultation.   No wheezes, crackles, or rhonchi. No acute distress. Heart:  Regular rate and rhythm; no murmurs, clicks, rubs,  or gallops. Abdomen: Non-distended, normal bowel sounds.  Soft and nontender without appreciable mass or hepatosplenomegaly.  Pulses:  Normal pulses noted. Extremities:  Without clubbing or edema.   Impression/Plan:   69 year old lady with multiple comorbidities including a GAVE with intermittent bleeding producing recurrent IDA.  Here for repeat EGD with treatment session as feasible/appropriate for planned.  No Eliquis  since 1/9. The risks, benefits, limitations, alternatives and imponderables have been reviewed with the patient. Potential for esophageal dilation, biopsy, etc. have also been reviewed.  Questions have been answered. All parties agreeable.     Notice: This dictation was prepared with Dragon dictation along with smaller phrase technology. Any transcriptional errors that result from this process are unintentional and may not be corrected upon review.

## 2024-04-13 ENCOUNTER — Encounter (HOSPITAL_COMMUNITY): Payer: Self-pay | Admitting: Internal Medicine

## 2024-04-14 LAB — METHYLMALONIC ACID, SERUM: Methylmalonic Acid, Quantitative: 253 nmol/L (ref 0–378)

## 2024-04-15 ENCOUNTER — Other Ambulatory Visit: Payer: Self-pay | Admitting: "Endocrinology

## 2024-04-15 DIAGNOSIS — E1122 Type 2 diabetes mellitus with diabetic chronic kidney disease: Secondary | ICD-10-CM

## 2024-04-17 ENCOUNTER — Inpatient Hospital Stay

## 2024-04-17 VITALS — BP 100/87 | HR 71 | Temp 98.2°F | Resp 16

## 2024-04-17 DIAGNOSIS — N184 Chronic kidney disease, stage 4 (severe): Secondary | ICD-10-CM

## 2024-04-17 DIAGNOSIS — N185 Chronic kidney disease, stage 5: Secondary | ICD-10-CM | POA: Diagnosis not present

## 2024-04-17 DIAGNOSIS — D500 Iron deficiency anemia secondary to blood loss (chronic): Secondary | ICD-10-CM

## 2024-04-17 DIAGNOSIS — D631 Anemia in chronic kidney disease: Secondary | ICD-10-CM

## 2024-04-17 LAB — SAMPLE TO BLOOD BANK

## 2024-04-17 LAB — CBC
HCT: 28.6 % — ABNORMAL LOW (ref 36.0–46.0)
Hemoglobin: 8.8 g/dL — ABNORMAL LOW (ref 12.0–15.0)
MCH: 30.9 pg (ref 26.0–34.0)
MCHC: 30.8 g/dL (ref 30.0–36.0)
MCV: 100.4 fL — ABNORMAL HIGH (ref 80.0–100.0)
Platelets: 199 K/uL (ref 150–400)
RBC: 2.85 MIL/uL — ABNORMAL LOW (ref 3.87–5.11)
RDW: 16.3 % — ABNORMAL HIGH (ref 11.5–15.5)
WBC: 6.5 K/uL (ref 4.0–10.5)
nRBC: 0 % (ref 0.0–0.2)

## 2024-04-17 MED ORDER — EPOETIN ALFA-EPBX 10000 UNIT/ML IJ SOLN
10000.0000 [IU] | Freq: Once | INTRAMUSCULAR | Status: AC
  Administered 2024-04-17: 10000 [IU] via SUBCUTANEOUS
  Filled 2024-04-17: qty 1

## 2024-04-17 NOTE — Patient Instructions (Signed)

## 2024-04-17 NOTE — Progress Notes (Signed)
 Patient's hgb 8.8 and blood pressure stable. Patient tolerated  Retacrit  injection with no complaints voiced.  Site clean and dry with no bruising or swelling noted at site.  See MAR for details.  Band aid applied.  Patient stable during and after injection.  Vss with discharge and left in satisfactory condition with no s/s of distress noted. All follow ups as scheduled.   [name]

## 2024-04-20 ENCOUNTER — Ambulatory Visit (INDEPENDENT_AMBULATORY_CARE_PROVIDER_SITE_OTHER): Admitting: "Endocrinology

## 2024-04-20 ENCOUNTER — Encounter: Payer: Self-pay | Admitting: "Endocrinology

## 2024-04-20 VITALS — BP 108/68 | HR 76 | Ht 62.0 in | Wt 231.0 lb

## 2024-04-20 DIAGNOSIS — Z794 Long term (current) use of insulin: Secondary | ICD-10-CM | POA: Diagnosis not present

## 2024-04-20 DIAGNOSIS — I10 Essential (primary) hypertension: Secondary | ICD-10-CM

## 2024-04-20 DIAGNOSIS — E782 Mixed hyperlipidemia: Secondary | ICD-10-CM | POA: Diagnosis not present

## 2024-04-20 DIAGNOSIS — N184 Chronic kidney disease, stage 4 (severe): Secondary | ICD-10-CM | POA: Diagnosis not present

## 2024-04-20 DIAGNOSIS — E1122 Type 2 diabetes mellitus with diabetic chronic kidney disease: Secondary | ICD-10-CM

## 2024-04-20 LAB — POCT GLYCOSYLATED HEMOGLOBIN (HGB A1C): HbA1c, POC (controlled diabetic range): 6.1 % (ref 0.0–7.0)

## 2024-04-20 NOTE — Progress Notes (Signed)
 " 04/20/2024  Endocrinology follow-up note   Subjective:    Patient ID: [name], female    DOB: 04/14/1955, PCP Carlette Benita Area, MD   Past Medical History:  Diagnosis Date   Arthritis    Asthma    Atrial fibrillation [facility])    Carcinoid tumor determined by biopsy of lung (HCC) 05/2018   Left lung   Chronic diastolic CHF (congestive heart failure) (HCC)    CKD (chronic kidney disease), stage III (HCC)    Diabetes mellitus type 2 in obese    Essential hypertension    GERD (gastroesophageal reflux disease)    Heart murmur    Iron  deficiency anemia 10/16/2010   Mental handicap 10/16/2010   Mild CAD 2013   Morbid obesity (HCC)    OSA (obstructive sleep apnea)    Past Surgical History:  Procedure Laterality Date   ABDOMINAL HYSTERECTOMY     AV FISTULA PLACEMENT Right 06/06/2018   Procedure: ARTERIOVENOUS (AV) FISTULA CREATION;  Surgeon: Gretta Lonni PARAS, MD;  Location[facility] OR;  Service: Vascular;  Laterality: Right;   BIOPSY  10/16/2021   Procedure: BIOPSY;  Surgeon: Shaaron Lamar HERO, MD;  Location: [facility];  Service: Endoscopy;;   BRONCHIAL DILITATION  12/13/2020   Procedure: BRONCHIAL DILITATION;  Surgeon: Brenna Adine CROME, DO;  Location: [facility] ENDOSCOPY;  Service: Pulmonary;;   CESAREAN SECTION     CHOLECYSTECTOMY     COLONOSCOPY  08/2010   normal TI, sigmoid polyp (adenoma ). Next TCS due  08/2015,   COLONOSCOPY N/A 10/06/2023   Procedure: COLONOSCOPY;  Surgeon: Shaaron Lamar HERO, MD;  Location: [facility];  Service: Endoscopy;  Laterality: N/A;  1:00pm, asa 3,a t [place]   COLONOSCOPY WITH PROPOFOL  N/A 07/08/2020   Procedure: COLONOSCOPY WITH PROPOFOL ;  Surgeon: Shaaron Lamar HERO, MD;  [facility]: [facility];  Service: Endoscopy;  Laterality: N/A;  AM (diabetic and facility patient)   ESOPHAGOGASTRODUODENOSCOPY  08/2010   antral and duodenal erosions s/p bx (chronic gastritis, no h.pylori, no celiac dz ), hiatal hernia   ESOPHAGOGASTRODUODENOSCOPY N/A  10/06/2023   Procedure: EGD (ESOPHAGOGASTRODUODENOSCOPY);  Surgeon: Shaaron Lamar HERO, MD;  [facility]: [facility];  Service: Endoscopy;  Laterality: N/A;   ESOPHAGOGASTRODUODENOSCOPY (EGD) WITH PROPOFOL  N/A 10/16/2021   Procedure: ESOPHAGOGASTRODUODENOSCOPY (EGD) WITH PROPOFOL ;  Surgeon: Shaaron Lamar HERO, MD;  [facility];  Service: Endoscopy;  Laterality: N/A;  11:45am   ESOPHAGOGASTRODUODENOSCOPY (EGD) WITH PROPOFOL  N/A 12/18/2021   Procedure: ESOPHAGOGASTRODUODENOSCOPY (EGD) WITH PROPOFOL ;  Surgeon: Cindie Carlin POUR, DO;  [facility];  Service: Endoscopy;  Laterality: N/A;   ESOPHAGOGASTRODUODENOSCOPY (EGD) WITH PROPOFOL  N/A 01/19/2023   Procedure: ESOPHAGOGASTRODUODENOSCOPY (EGD) WITH PROPOFOL ;  Surgeon: Eartha Angelia Sieving, MD;  [facility];  Service: Gastroenterology;  Laterality: N/A;  2:30 pm, asa 3   FISTULA SUPERFICIALIZATION Right 08/25/2018   Procedure: FISTULA SUPERFICIALIZATION RIGHT ARM;  Surgeon: Sheree Penne Lonni, MD;  Location: [facility] OR;  Service: Cardiovascular;  Laterality: Right;   HOT HEMOSTASIS  12/18/2021   Procedure: HOT HEMOSTASIS (ARGON PLASMA COAGULATION/BICAP);  Surgeon: Cindie Carlin POUR, DO;  [facility];  Service: Endoscopy;;   HOT HEMOSTASIS  01/19/2023   Procedure: HOT HEMOSTASIS (ARGON PLASMA COAGULATION/BICAP);  Surgeon: Eartha Angelia, Sieving, MD;  [facility];  Service: Gastroenterology;;   HOT HEMOSTASIS N/A 04/12/2024   Procedure: EGD, WITH ARGON PLASMA COAGULATION;  Surgeon: Shaaron Lamar HERO, MD;  [facility];  Service: Endoscopy;  Laterality: N/A;  8:30 am, asa 3   IR FLUORO GUIDE CV LINE RIGHT  05/31/2018   IR US  GUIDE VASC ACCESS RIGHT  05/31/2018   KNEE SURGERY     right knee @ 69 years of age   LEFT AND RIGHT HEART CATHETERIZATION WITH CORONARY ANGIOGRAM N/A 09/21/2011   Procedure: LEFT AND RIGHT HEART CATHETERIZATION WITH CORONARY ANGIOGRAM;  Surgeon: Salena GORMAN Negri, MD;   Location: [facility];  Service: Cardiovascular;  Laterality: N/A;   POLYPECTOMY  07/08/2020   Procedure: POLYPECTOMY INTESTINAL;  Surgeon: Shaaron Lamar HERO, MD;  [facility]: [facility];  Service: Endoscopy;;   VIDEO BRONCHOSCOPY Left 12/13/2020   Procedure: VIDEO BRONCHOSCOPY WITHOUT FLUORO;  Surgeon: Brenna Adine CROME, DO;  Location: [facility];  Service: Pulmonary;  Laterality: Left;  Cryotherapy   Social History   Socioeconomic History   Marital status: Divorced    Spouse name: Not on file   Number of children: 1   Years of education: Not on file   Highest education level: Not on file  Occupational History    Employer: UNEMPLOYED  Tobacco Use   Smoking status: Former    Current packs/day: 0.00    Average packs/day: 1.0 packs/day    Types: Cigarettes    Start date: 04/11/1996    Quit date: 04/11/1996    Years since quitting: 69.0   Smokeless tobacco: Never   Tobacco comments:    quit a couple year ago  Vaping Use   Vaping status: Never Used  Substance and Sexual Activity   Alcohol  use: No   Drug use: No   Sexual activity: Never  Other Topics Concern   Not on file  Social History Narrative   Lives with parents.    Social Drivers of Health   Tobacco Use: Medium Risk (04/20/2024)   Patient History    Smoking Tobacco Use: Former    Smokeless Tobacco Use: Never    Passive Exposure: Not on file  Financial Resource Strain: Not on file  Food Insecurity: No Food Insecurity (12/17/2021)   Hunger Vital Sign    Worried About Running Out of Food in the Last Year: Never true    Ran Out of Food in the Last Year: Never true  Transportation Needs: No Transportation Needs (12/17/2021)   PRAPARE - Administrator, Civil Service (Medical): No    Lack of Transportation (Non-Medical): No  Physical Activity: Not on file  Stress: Not on file  Social Connections: Not on file  Depression (PHQ2-9): Low Risk (02/23/2024)   Depression (PHQ2-9)    PHQ-2 Score: 0  Alcohol  Screen:  Not on file  Housing: Low Risk (12/17/2021)   Housing    Last Housing Risk Score: 0  Utilities: Not At Risk (12/17/2021)   AHC Utilities    Threatened with loss of utilities: No  Health Literacy: Not on file   Outpatient Encounter Medications as of 04/20/2024  Medication Sig   acetaminophen  (TYLENOL ) 325 MG tablet Take 325 mg by mouth every 12 (twelve) hours as needed for moderate pain (pain score 4-6).   albuterol  (PROVENTIL  HFA;VENTOLIN  HFA) 108 (90 Base) MCG/ACT inhaler Inhale 2 puffs into the lungs every 4 (four) hours as needed for shortness of breath.   apixaban  (ELIQUIS ) 5 MG TABS tablet Take 1 tablet (5 mg total) by mouth 2 (two) times daily.   calcitRIOL  (ROCALTROL ) 0.25 MCG capsule Take 0.25 mcg by mouth every Monday, Wednesday, and Friday.   cetirizine  (ZYRTEC ) 10 MG tablet Take 10 mg  by mouth daily.   Cholecalciferol  (VITAMIN D3) 50 MCG (2000 UT) TABS TAKE 1 TABLET BY MOUTH ONCE DAILY.   cyanocobalamin  (VITAMIN B12) 1000 MCG tablet Take 1 tablet (1,000 mcg total) by mouth every other day.   diltiazem  (CARDIZEM  CD) 240 MG 24 hr capsule Take 1 capsule (240 mg total) by mouth daily.   docusate sodium (COLACE) 100 MG capsule Take 100 mg by mouth 2 (two) times daily as needed for mild constipation.   DROPSAFE SAFETY PEN NEEDLES 31G X 6 MM MISC    EASYMAX TEST test strip CHECK BLOOD SUGAR 4 TIMES A DAY. (BREAKFAST, LUNCH, SUPPER & BEDTIME)   ferrous sulfate  325 (65 FE) MG tablet Take 1 tablet (325 mg total) by mouth every other day.   Finerenone  (KERENDIA ) 10 MG TABS Take 10 mg by mouth daily.   fluticasone  (FLONASE ) 50 MCG/ACT nasal spray Place 2 sprays into both nostrils daily as needed for allergies.   folic acid  (FOLVITE ) 1 MG tablet Take 1 tablet (1 mg total) by mouth daily.   furosemide  (LASIX ) 20 MG tablet TAKE 1 TABLET BY MOUTH EVERY OTHER DAY,ALTERNATE WITH 40 MG TABLET.   furosemide  (LASIX ) 40 MG tablet TAKE 1 TABLET BY MOUTH EVERY OTHER DAY,ALTERNATE WITH 20 MG TABLET.    GERI-TUSSIN DM 10-100 MG/5ML liquid Take by mouth.   hydrOXYzine (ATARAX) 10 MG tablet Take 10 mg by mouth 3 (three) times daily as needed.   insulin  regular human CONCENTRATED (HUMULIN  R U-500 KWIKPEN) 500 UNIT/ML KwikPen INJECT 50 UNITS SUBCUTANEOUSLY AT BREAKFAST & SUPPER; WHEN GLUCOSE IS ABOVE 90 & EATING.(HOLD IF BS BELOW 90: CALL MD IF BS ABOVE 400)   ipratropium-albuterol  (DUONEB) 0.5-2.5 (3) MG/3ML SOLN Take 3 mLs by nebulization every 6 (six) hours as needed (for cough).   isosorbide  dinitrate (ISORDIL ) 10 MG tablet Take 1 tablet (10 mg total) by mouth 3 (three) times daily.   Lancets 30G MISC CHECK BLOOD SUGAR 4 TIMES A DAY. (BREAKFAST, LUNCH, SUPPER, & BEDTIME)   lansoprazole  (PREVACID ) 30 MG capsule Take 1 capsule (30 mg total) by mouth 2 (two) times daily before a meal.   loperamide (IMODIUM) 2 MG capsule Take 2-4 mg by mouth See admin instructions. Take 4 mg by mouth now: then 2 mg after each loose stool as needed, do not exceed 5 doses in 24 hours.   losartan  (COZAAR ) 25 MG tablet Take 12.5 mg by mouth daily.   metolazone (ZAROXOLYN) 5 MG tablet Take 5 mg by mouth in the morning.   metoprolol  tartrate (LOPRESSOR ) 50 MG tablet Take 50 mg by mouth 2 (two) times daily.   montelukast  (SINGULAIR ) 10 MG tablet Take 10 mg by mouth at bedtime.   polyethylene glycol powder (GLYCOLAX /MIRALAX ) 17 GM/SCOOP powder Take 17 g by mouth daily as needed for mild constipation or moderate constipation.   potassium chloride  SA (KLOR-CON  M) 20 MEQ tablet Take 20 mEq by mouth daily.   rosuvastatin  (CRESTOR ) 10 MG tablet Take 10 mg by mouth at bedtime.   TRELEGY ELLIPTA 100-62.5-25 MCG/ACT AEPB Inhale 1 puff into the lungs daily.   triamcinolone cream (KENALOG) 0.5 % Apply 1 application  topically every 12 (twelve) hours as needed (applied to bilateral legs for allergic dermatitis).   No facility-administered encounter medications on file as of 04/20/2024.   ALLERGIES: No Known Allergies VACCINATION  STATUS: Immunization History  Administered Date(s) Administered   Hepatitis B, ADULT 07/29/2018, 08/29/2018, 10/07/2018   Influenza Inj Mdck Quad Pf 12/19/2016   Influenza,inj,Quad PF,6+ Mos  02/02/2013, 05/15/2018   Influenza-Unspecified 05/15/2018   PPD Test 07/22/2018   Pneumococcal Polysaccharide-23 05/15/2018   Pneumococcal-Unspecified 05/15/2018    Diabetes She presents for her follow-up diabetic visit. She has type 2 diabetes mellitus. Onset time: she was diagnosed at approximate age of 14 years. Her disease course has been fluctuating. There are no hypoglycemic associated symptoms. Pertinent negatives for hypoglycemia include no confusion, headaches, pallor or seizures. Pertinent negatives for diabetes include no blurred vision, no chest pain, no fatigue, no polydipsia, no polyphagia and no polyuria. There are no hypoglycemic complications. Symptoms are worsening. Diabetic complications include nephropathy. Risk factors for coronary artery disease include diabetes mellitus, dyslipidemia, hypertension, obesity and sedentary lifestyle. Current diabetic treatment includes intensive insulin  program. Her weight is stable. She is following a generally unhealthy diet. She has had a previous visit with a dietitian. She never participates in exercise. Her home blood glucose trend is fluctuating minimally. Her breakfast blood glucose range is generally 180-200 mg/dl. Her lunch blood glucose range is generally 180-200 mg/dl. Her dinner blood glucose range is generally 180-200 mg/dl. Her bedtime blood glucose range is generally 180-200 mg/dl. Her overall blood glucose range is 180-200 mg/dl. (She presents without her logs nor meter to review.  Her point-of-care A1c was 6.1% increasing from 5.4%.  Her ongoing anemia might have undermined her A1c.    ) An ACE inhibitor/angiotensin II receptor blocker is being taken.  Hypertension This is a chronic problem. The current episode started more than 1 year ago.  The problem is uncontrolled. Pertinent negatives include no blurred vision, chest pain, headaches, palpitations or shortness of breath. Risk factors for coronary artery disease include diabetes mellitus, dyslipidemia, obesity and sedentary lifestyle. Past treatments include ACE inhibitors. Hypertensive end-organ damage includes kidney disease. Identifiable causes of hypertension include chronic renal disease.  Hyperlipidemia This is a chronic problem. The current episode started more than 1 year ago. The problem is uncontrolled. Exacerbating diseases include chronic renal disease, diabetes and obesity. Pertinent negatives include no chest pain, myalgias or shortness of breath. Current antihyperlipidemic treatment includes statins and fibric acid derivatives. Risk factors for coronary artery disease include diabetes mellitus, dyslipidemia, obesity, a sedentary lifestyle and hypertension.    Objective:    BP 108/68   Pulse 76   Ht 5' 2 (1.575 m)   Wt 231 lb (104.8 kg)   BMI 42.25 kg/m   Wt Readings from Last 3 Encounters:  04/20/24 231 lb (104.8 kg)  04/12/24 231 lb 11.3 oz (105.1 kg)  04/07/24 231 lb 11.3 oz (105.1 kg)      Results for orders placed or performed in visit on 04/20/24  HgB A1c   Collection Time: 04/20/24 11:10 AM  Result Value Ref Range   Hemoglobin A1C     HbA1c POC (<> result, manual entry)     HbA1c, POC (prediabetic range)     HbA1c, POC (controlled diabetic range) 6.1 0.0 - 7.0 %   Complete Blood Count (Most recent): Lab Results  Component Value Date   WBC 6.5 04/17/2024   HGB 8.8 (L) 04/17/2024   HCT 28.6 (L) 04/17/2024   MCV 100.4 (H) 04/17/2024   PLT 199 04/17/2024    Diabetic Labs (most recent): Lab Results  Component Value Date   HGBA1C 6.1 04/20/2024   HGBA1C 5.4 12/14/2023   HGBA1C 6.3 08/12/2023   MICROALBUR 150 04/25/2020   MICROALBUR 23.4 09/12/2019   MICROALBUR 0.2 07/13/2017   Lipid Panel     Component Value Date/Time  CHOL 107  10/06/2022 0802   TRIG 133 10/06/2022 0802   HDL 42 10/06/2022 0802   CHOLHDL 2.5 10/06/2022 0802   CHOLHDL 5.5 (H) 09/12/2019 0831   VLDL 59 (H) 06/02/2018 0029   LDLCALC 42 10/06/2022 0802   LDLCALC 124 (H) 09/12/2019 0831    Assessment & Plan:   1. Type 2 diabetes mellitus with stage 3-4 chronic kidney disease, with long-term current use of insulin    -Her diabetes is  complicated by CKD, congestive heart failure,  obesity/sedentary life, and patient remains at extremely high risk for more acute and chronic complications of diabetes which include CAD, CVA, CKD, retinopathy, and neuropathy. These are all discussed in detail with the patient.  She presents without her logs nor meter to review.  Her point-of-care A1c was 6.1% increasing from 5.4%.  Her ongoing anemia might have undermined her A1c.    - I have re-counseled the patient on diet management and weight loss  by adopting a carbohydrate restricted / protein rich  Diet.   - she acknowledges that there is a room for improvement in her food and drink choices. - Suggestion is made for her to avoid simple carbohydrates  from her diet including Cakes, Sweet Desserts, Ice Cream, Soda (diet and regular), Sweet Tea, Candies, Chips, Cookies, Store Bought Juices, Alcohol  in Excess of  1-2 drinks a day, Artificial Sweeteners,  Coffee Creamer, and Sugar-free Products, Lemonade. This will help patient to have more stable blood glucose profile and potentially avoid unintended weight gain.  - Patient is advised to stick to a routine mealtimes to eat 3 meals  a day and avoid unnecessary snacks ( to snack only to correct hypoglycemia).  - I have approached patient with the following individualized plan to manage diabetes and patient agrees.   - In light of her presentation without her logs nor meter, optimal adjustment of insulin  dose is not possible today.  She is advised to continue   insulin  U500,    50 units at breakfast, and 50 units with  supper  when glucose is above 90 mg/dl associated with monitoring of blood glucose before meals and at bedtime. - She is advised to skip insulin  if pre-meal blood glucose is below 90 mg/dL or if she is not eating. -She is not a candidate for metformin .   - Patient specific target  for A1c; LDL, HDL, Triglycerides, were discussed in detail.  2) BP/HTN:  - Her blood pressure is controlled to target.  She has urine microalbuminuria .  -She is advised  to continue current medications including lisinopril  20 mg p.o. daily, hydralazine  25 mg p.o. 3 times daily, furosemide  40 mg p.o. Daily.  3) Lipids/HPL: Her recent lipid panel showed significantly controlled LDL at 42.  She is advised to continue Crestor  20 mg p.o. nightly.     Side effects and precautions discussed with her.    She is also on fenofibrate .     4)  Weight/Diet: Her BMI is 42.25 kg/m-  She is still a candidate for modest weight loss.   CDE consult in progress, exercise, and carbohydrates information provided.  5) Chronic Care/Health Maintenance:  -Patient is  on ACEI and encouraged to continue to follow up with Ophthalmology, Podiatrist at least yearly or according to recommendations, and advised to stay away from smoking. I have recommended yearly flu vaccine and pneumonia vaccination at least every 5 years;  and  sleep for at least 7 hours a day.    -She is  advised to maintain close follow-up with her nephrologist.  - I advised patient to maintain close follow up with [name], Tesfaye Demissie, MD for primary care needs.   I spent  25  minutes in the care of the patient today including review of labs from Thyroid  Function, CMP, and other relevant labs ; home blood glucose readings verbally reported, imaging/biopsy records (current and previous including abstractions from other facilities); face-to-face time discussing  her lab results and symptoms, medications doses, her options of short and long term treatment based on the  latest standards of care / guidelines;   and documenting the encounter.  [name]  participated in the discussions, expressed understanding, and voiced agreement with the above plans.  All questions were answered to her satisfaction. she is encouraged to contact clinic should she have any questions or concerns prior to her return visit. Dear Patient: Feel free to review your progress notes.  If you are reviewing this progress note and have questions about the meaning of /or medical terms being used, please make a note and address it at your next follow-up appointment.  Medical notes are meant to be a communication tool between medical professionals and require medical terms to be used for efficiency and insurance approval.    Follow up plan: -Return in about 3 months (around 07/19/2024) for Bring Meter/CGM Device/Logs- A1c in Office.  Ranny Earl, MD Phone: [phone]  Fax: [phone]  This note was partially dictated with voice recognition software. Similar sounding words can be transcribed inadequately or may not  be corrected upon review.  04/20/2024, 12:38 PM "

## 2024-04-24 ENCOUNTER — Inpatient Hospital Stay

## 2024-04-24 ENCOUNTER — Inpatient Hospital Stay: Admitting: Physician Assistant

## 2024-04-24 ENCOUNTER — Ambulatory Visit: Admitting: Internal Medicine

## 2024-04-24 NOTE — Progress Notes (Unsigned)
 "  [facility] [address], [place] [address]   CLINIC:  Medical Oncology/Hematology  PCP:  Carlette Benita Area, MD [address] [place] [address] [phone]   REASON FOR VISIT:  Follow-up for anemia (GI blood loss + CKD stage IV) + carcinoid tumor of the lung (surveillance)  CURRENT THERAPY: Intermittent IV iron  and blood transfusions  INTERVAL HISTORY:   [name] 69 y.o. female returns for routine follow-up of anemia related to CKD stage V and iron  deficiency. She was last seen by Pleasant Barefoot PA-C on 02/23/2024. She is accompanied today by staff member [phone]) from [facility].    At today's visit, she reports feeling fairly well.  She  reports 75% energy and 50% appetite.   She  is maintaining stable weight at this time.  At today's visit, patient reports increased respiratory symptoms.  She reports that she is wheezing all the time.  She also reports increased cough and dyspnea on exertion, which she attributes to her COPD. She does sometimes feel hot on the inside, but is not sure if she has any facial flushing. She does have diarrhea from time to time. No hemoptysis or chest pain.  Patient reports melanotic stool, but is unable to give timeline for when this occurred. She denies any gross hematochezia. Denies any headaches, lightheadedness, or syncope.  ASSESSMENT & PLAN:  1.  Iron  deficiency anemia due to chronic blood loss # Anemia due to CKD stage IV - Chronic multifactorial anemia related to advanced CKD and intermittent blood loss from GAVE - She follows with Dr. Rachele for her CKD stage V.  (She was previously on dialysis and receiving EPO injections).  Erythropoietin  level from 06/28/2023 was mildly elevated at 38.5. - Multiple myeloma workup: SPEP: No M spike, IFE: Normal. Slightly elevated free light chains with normal ratio - Most recent EGD (04/12/2024): Portal hypertensive gastropathy.   Mild GAVE, s/p APC ablation.  (Prior EGD from July 2025 showed GAVE with active oozing) - Colonoscopy (10/06/2023): Polyps x 2 (tubular adenoma) - Patient has required intermittent blood transfusions.  Most recently received PRBC x 2 on 02/18/2024 (Hgb 6.5 on 02/16/2024) - Received intermittent IV iron , most recently with Monoferric  x 1 g on 11/26/2023 and again on 02/18/2024  - She is taking iron  pill every other day - Started on Retacrit  10,000 units weekly as of November 2025. - Reports melanotic stool from time to time - Most recent labs (04/10/2024): Hgb 9.7/MCV 98.1 Ferritin 259, iron  saturation 17% Creatinine 1.77/GFR 31 - CBC today (04/26/2024):  Hgb 8.8/MCV 100.0. - PLAN: No indication for IV iron . - Hemoglobin not at goal.  Recommend to INCREASE Retacrit  to 20,000 units weekly. - Continue iron  pills every other day - RTC in 3 months ** Since patient also has bronchopulmonary carcinoid tumor, discussed with supervising MD (Dr. Davonna) regarding use of ESA injections.  She agrees with continuing Retacrit  at this time.  2.  Bronchopulmonary carcinoid tumor - CT chest performed during hospitalization in February 2020 showed masslike consolidation measuring 5 x 8.2 cm over left lower lobe - Endobronchial biopsy of left lower lobe mass (05/26/2018): Typical carcinoid tumor, stain positive for synaptophysin and chromogranin.  Atypical squamous metaplasia (favor reactive).  No mitotic activity or necrosis identified.  Ki-67 shows proliferative index of <1%. - Evaluated by cardiothoracic surgery (Dr. Shyrl) in November 2022, and felt to be poor candidate for surgical resection.  Since tumor has been stable and asymptomatic, active  surveillance with oncology and pulmonology was recommended instead.   - Radiation therapy not felt to be helpful with this tumor type, per tumor board discussion in 2022. - NCCN Guidelines for Locoregional Unresectable Neuroendocrine Tumor of the Lung (low-grade,  typical carcinoid) Observation if asymptomatic For symptom and/or tumor control, possible treatment options include everolimus, octreotide (if SSTR positive and/or hormonal symptoms), or temozolomide +/- capecitabine - Tumor thought to be causing some left lower lobe airway obstruction with some postobstructive atelectasis of anterior left lobe  - She has been undergoing active surveillance since time of diagnosis with annual CT chest - Chromogranin A (04/10/2024) trending upward at 1086.0 NOTE that she is also on pantoprazole  twice daily since at least 2023, which can also cause some elevation in chromogranin A - 24-hour urine 5HIAA was ordered, but never returned by patient. - Most recent CT chest (02/16/2024): Partially endobronchial left lower lobe nodule measures 1.2 x 1.4 cm, similar on prior exam. Anterior left lower lobe collapse/consolidation secondary to endobronchial obstruction is minimally increased Left lower lobe 1 mm calcified granuloma Right lower lobe subpleural 1 mm pulmonary nodule (not readily apparent on prior) - attention recommended at follow-up No thoracic adenopathy or evidence of metastatic disease - Patient reports worsening wheezing and respiratory symptoms.  Intermittently feels hot on the inside, unsure if she has any facial flushing.  She has diarrhea from time to time.  No hemoptysis or chest pain. - PLAN: Discussed with supervising MD (Dr. Davonna)  - Due to worsening respiratory symptoms and uptrending chromogranin A, we will check dotatate PET scan.  (Message sent to nephrologist Dr. Rachele to ensure that we can continue with dotatate PET in light of patient's CKD.) - Patient provided with 24-hour urine kit for 5-HIAA - MD visit with Dr. Davonna after PET scan to discuss possible treatment options as indicated. - Patient has been referred back to [facility].  Scheduled for initial visit with Dr. Darlean later this month. #ADDENDUM (04/26/24 4:16 PM):  Discussed with nephrologist Dr. Rachele regarding dotatate PET in light of patient's CKD.  He agrees with proceeding with PET scan, since patient's most recent GFR was 32.   3.  Vitamin B12 deficiency - She is taking vitamin B12 1000 mcg every other day  - Most recent labs (04/10/2024) show elevated vitamin B12 at 1725, normal MMA.  Folate normal. - PLAN: Recommend to DECREASE vitamin B12 to take TWICE WEEKLY.  Recheck at follow-up.  4.  Atrial fibrillation, on Eliquis  - She has history of atrial fibrillation, on Eliquis  - PLAN: Recommend the patient discussed with GI and cardiology whether or not to remain on Eliquis    5.  Right adrenal nodule, adenoma - Most recent CT chest with some visualization of upper abdomen shows right adrenal mass measuring 3.3 x 2.5 cm and 14 HU, present and stable since at least 05/21/2016.  Consistent with benign adenoma, no specific follow-up indicated.  6.  Other history - Patient with mental disability and long-term care facility.  She has a legal guardian with [employer]. - Other PMH includes asthma, hypertension, diabetes, CKD stage IV/V   PLAN SUMMARY:  >> Dotatate-PET scan, next available >> Continue CBC/BBS + Retacrit  every week >> 24-hour urine kit for 5HIAA  >> MD visit w/ Dr. Davonna 1 week after PET     REVIEW OF SYSTEMS:   Review of Systems  Constitutional:  Positive for fatigue. Negative for appetite change, chills, diaphoresis, fever and unexpected weight change.  HENT:   Negative  for lump/mass and nosebleeds.   Eyes:  Negative for eye problems.  Respiratory:  Positive for cough (at times), shortness of breath (with exertion) and wheezing. Negative for hemoptysis.   Cardiovascular:  Negative for chest pain, leg swelling and palpitations.  Gastrointestinal:  Positive for diarrhea. Negative for abdominal pain, blood in stool, constipation, nausea and vomiting.  Endocrine: Positive for hot flashes (at times).  Genitourinary:  Negative for hematuria.    Skin: Negative.   Neurological:  Negative for dizziness, headaches and light-headedness.  Hematological:  Does not bruise/bleed easily.     PHYSICAL EXAM:  ECOG PERFORMANCE STATUS: 2 - Symptomatic, <50% confined to bed  Vitals:   04/26/24 1007  BP: 132/68  Pulse: 70  Resp: 18  Temp: 98.9 F (37.2 C)  SpO2: 100%    Filed Weights   04/26/24 1007  Weight: 232 lb (105.2 kg)    Physical Exam Constitutional:      Appearance: Normal appearance. She is morbidly obese.  Cardiovascular:     Heart sounds: Normal heart sounds.  Pulmonary:     Breath sounds: Normal breath sounds.  Neurological:     General: No focal deficit present.     Mental Status: Mental status is at baseline.  Psychiatric:        Behavior: Behavior normal. Behavior is cooperative.     PAST MEDICAL/SURGICAL HISTORY:  Past Medical History:  Diagnosis Date   Arthritis    Asthma    Atrial fibrillation (HCC)    Carcinoid tumor determined by biopsy of lung (HCC) 05/2018   Left lung   Chronic diastolic CHF (congestive heart failure) (HCC)    CKD (chronic kidney disease), stage III (HCC)    Diabetes mellitus type 2 in obese    Essential hypertension    GERD (gastroesophageal reflux disease)    Heart murmur    Iron  deficiency anemia 10/16/2010   Mental handicap 10/16/2010   Mild CAD 2013   Morbid obesity (HCC)    OSA (obstructive sleep apnea)    Past Surgical History:  Procedure Laterality Date   ABDOMINAL HYSTERECTOMY     AV FISTULA PLACEMENT Right 06/06/2018   Procedure: ARTERIOVENOUS (AV) FISTULA CREATION;  Surgeon: Gretta Lonni PARAS, MD;  Location: [facility] OR;  Service: Vascular;  Laterality: Right;   BIOPSY  10/16/2021   Procedure: BIOPSY;  Surgeon: Shaaron Lamar HERO, MD;  [facility];  Service: Endoscopy;;   BRONCHIAL DILITATION  12/13/2020   Procedure: BRONCHIAL DILITATION;  Surgeon: Brenna Adine CROME, DO;  Location: [facility];  Service: Pulmonary;;   CESAREAN SECTION      CHOLECYSTECTOMY     COLONOSCOPY  08/2010   normal TI, sigmoid polyp (adenoma ). Next TCS due  08/2015,   COLONOSCOPY N/A 10/06/2023   Procedure: COLONOSCOPY;  Surgeon: Shaaron Lamar HERO, MD;  [facility];  Service: Endoscopy;  Laterality: N/A;  1:00pm, asa 3,a t [place]   COLONOSCOPY WITH PROPOFOL  N/A 07/08/2020   Procedure: COLONOSCOPY WITH PROPOFOL ;  Surgeon: Shaaron Lamar HERO, MD;  [facility];  Service: Endoscopy;  Laterality: N/A;  AM (diabetic and facility patient)   ESOPHAGOGASTRODUODENOSCOPY  08/2010   antral and duodenal erosions s/p bx (chronic gastritis, no h.pylori, no celiac dz ), hiatal hernia   ESOPHAGOGASTRODUODENOSCOPY N/A 10/06/2023   Procedure: EGD (ESOPHAGOGASTRODUODENOSCOPY);  Surgeon: Shaaron Lamar HERO, MD;  [facility];  Service: Endoscopy;  Laterality: N/A;   ESOPHAGOGASTRODUODENOSCOPY (EGD) WITH PROPOFOL  N/A 10/16/2021   Procedure: ESOPHAGOGASTRODUODENOSCOPY (EGD)  WITH PROPOFOL ;  Surgeon: Shaaron Lamar HERO, MD;  [facility];  Service: Endoscopy;  Laterality: N/A;  11:45am   ESOPHAGOGASTRODUODENOSCOPY (EGD) WITH PROPOFOL  N/A 12/18/2021   Procedure: ESOPHAGOGASTRODUODENOSCOPY (EGD) WITH PROPOFOL ;  Surgeon: Cindie Carlin POUR, DO;  [facility];  Service: Endoscopy;  Laterality: N/A;   ESOPHAGOGASTRODUODENOSCOPY (EGD) WITH PROPOFOL  N/A 01/19/2023   Procedure: ESOPHAGOGASTRODUODENOSCOPY (EGD) WITH PROPOFOL ;  Surgeon: Eartha Angelia Sieving, MD;  [facility];  Service: Gastroenterology;  Laterality: N/A;  2:30 pm, asa 3   FISTULA SUPERFICIALIZATION Right 08/25/2018   Procedure: FISTULA SUPERFICIALIZATION RIGHT ARM;  Surgeon: Sheree Penne Bruckner, MD;  Location: [facility] OR;  Service: Cardiovascular;  Laterality: Right;   HOT HEMOSTASIS  12/18/2021   Procedure: HOT HEMOSTASIS (ARGON PLASMA COAGULATION/BICAP);  Surgeon: Cindie Carlin POUR, DO;  [facility];  Service: Endoscopy;;   HOT HEMOSTASIS  01/19/2023    Procedure: HOT HEMOSTASIS (ARGON PLASMA COAGULATION/BICAP);  Surgeon: Eartha Angelia, Sieving, MD;  [facility];  Service: Gastroenterology;;   HOT HEMOSTASIS N/A 04/12/2024   Procedure: EGD, WITH ARGON PLASMA COAGULATION;  Surgeon: Shaaron Lamar HERO, MD;  [facility];  Service: Endoscopy;  Laterality: N/A;  8:30 am, asa 3   IR FLUORO GUIDE CV LINE RIGHT  05/31/2018   IR US  GUIDE VASC ACCESS RIGHT  05/31/2018   KNEE SURGERY     right knee @ 69 years of age   LEFT AND RIGHT HEART CATHETERIZATION WITH CORONARY ANGIOGRAM N/A 09/21/2011   Procedure: LEFT AND RIGHT HEART CATHETERIZATION WITH CORONARY ANGIOGRAM;  Surgeon: Salena GORMAN Negri, MD;  Location: [facility];  Service: Cardiovascular;  Laterality: N/A;   POLYPECTOMY  07/08/2020   Procedure: POLYPECTOMY INTESTINAL;  Surgeon: Shaaron Lamar HERO, MD;  [facility];  Service: Endoscopy;;   VIDEO BRONCHOSCOPY Left 12/13/2020   Procedure: VIDEO BRONCHOSCOPY WITHOUT FLUORO;  Surgeon: Brenna Adine CROME, DO;  Location: [facility];  Service: Pulmonary;  Laterality: Left;  Cryotherapy    SOCIAL HISTORY:  Social History   Socioeconomic History   Marital status: Divorced    Spouse name: Not on file   Number of children: 1   Years of education: Not on file   Highest education level: Not on file  Occupational History    Employer: UNEMPLOYED  Tobacco Use   Smoking status: Former    Current packs/day: 0.00    Average packs/day: 1.0 packs/day    Types: Cigarettes    Start date: 04/11/1996    Quit date: 04/11/1996    Years since quitting: 28.0   Smokeless tobacco: Never   Tobacco comments:    quit a couple year ago  Vaping Use   Vaping status: Never Used  Substance and Sexual Activity   Alcohol  use: No   Drug use: No   Sexual activity: Never  Other Topics Concern   Not on file  Social History Narrative   Lives with parents.    Social Drivers of Health   Tobacco Use: Medium Risk (04/20/2024)   Patient History     Smoking Tobacco Use: Former    Smokeless Tobacco Use: Never    Passive Exposure: Not on file  Financial Resource Strain: Not on file  Food Insecurity: No Food Insecurity (12/17/2021)   Hunger Vital Sign    Worried About Running Out of Food in the Last Year: Never true    Ran Out of Food in the Last Year: Never true  Transportation Needs: No Transportation Needs (  12/17/2021)   PRAPARE - Administrator, Civil Service (Medical): No    Lack of Transportation (Non-Medical): No  Physical Activity: Not on file  Stress: Not on file  Social Connections: Not on file  Intimate Partner Violence: Not At Risk (12/17/2021)   Humiliation, Afraid, Rape, and Kick questionnaire    Fear of Current or Ex-Partner: No    Emotionally Abused: No    Physically Abused: No    Sexually Abused: No  Depression (PHQ2-9): Low Risk (04/26/2024)   Depression (PHQ2-9)    PHQ-2 Score: 0  Alcohol  Screen: Not on file  Housing: Low Risk (12/17/2021)   Housing    Last Housing Risk Score: 0  Utilities: Not At Risk (12/17/2021)   AHC Utilities    Threatened with loss of utilities: No  Health Literacy: Not on file    FAMILY HISTORY:  Family History  Problem Relation Age of Onset   Diabetes Mother    Hypertension Mother    Heart failure Mother    Hypertension Father    Atrial fibrillation Father    Hypertension Son    Kidney disease Other        son reportedly has had cyst on kidney and lung s/p surgery at [facility]   Colon cancer Neg Hx     CURRENT MEDICATIONS:  Outpatient Encounter Medications as of 04/26/2024  Medication Sig   acetaminophen  (TYLENOL ) 325 MG tablet Take 325 mg by mouth every 12 (twelve) hours as needed for moderate pain (pain score 4-6).   albuterol  (PROVENTIL  HFA;VENTOLIN  HFA) 108 (90 Base) MCG/ACT inhaler Inhale 2 puffs into the lungs every 4 (four) hours as needed for shortness of breath.   apixaban  (ELIQUIS ) 5 MG TABS tablet Take 1 tablet (5 mg total) by mouth 2 (two) times daily.    calcitRIOL  (ROCALTROL ) 0.25 MCG capsule Take 0.25 mcg by mouth every Monday, Wednesday, and Friday.   cetirizine  (ZYRTEC ) 10 MG tablet Take 10 mg by mouth daily.   Cholecalciferol  (VITAMIN D3) 50 MCG (2000 UT) TABS TAKE 1 TABLET BY MOUTH ONCE DAILY.   cyanocobalamin  (VITAMIN B12) 1000 MCG tablet Take 1 tablet (1,000 mcg total) by mouth every other day.   diltiazem  (CARDIZEM  CD) 240 MG 24 hr capsule Take 1 capsule (240 mg total) by mouth daily.   docusate sodium (COLACE) 100 MG capsule Take 100 mg by mouth 2 (two) times daily as needed for mild constipation.   DROPSAFE SAFETY PEN NEEDLES 31G X 6 MM MISC    EASYMAX TEST test strip CHECK BLOOD SUGAR 4 TIMES A DAY. (BREAKFAST, LUNCH, SUPPER & BEDTIME)   ferrous sulfate  325 (65 FE) MG tablet Take 1 tablet (325 mg total) by mouth every other day.   Finerenone  (KERENDIA ) 10 MG TABS Take 10 mg by mouth daily.   fluticasone  (FLONASE ) 50 MCG/ACT nasal spray Place 2 sprays into both nostrils daily as needed for allergies.   folic acid  (FOLVITE ) 1 MG tablet Take 1 tablet (1 mg total) by mouth daily.   furosemide  (LASIX ) 20 MG tablet TAKE 1 TABLET BY MOUTH EVERY OTHER DAY,ALTERNATE WITH 40 MG TABLET.   furosemide  (LASIX ) 40 MG tablet TAKE 1 TABLET BY MOUTH EVERY OTHER DAY,ALTERNATE WITH 20 MG TABLET.   GERI-TUSSIN DM 10-100 MG/5ML liquid Take by mouth.   hydrOXYzine (ATARAX) 10 MG tablet Take 10 mg by mouth 3 (three) times daily as needed.   insulin  regular human CONCENTRATED (HUMULIN  R U-500 KWIKPEN) 500 UNIT/ML KwikPen INJECT 50 UNITS SUBCUTANEOUSLY AT  BREAKFAST & SUPPER; WHEN GLUCOSE IS ABOVE 90 & EATING.(HOLD IF BS BELOW 90: CALL MD IF BS ABOVE 400)   ipratropium-albuterol  (DUONEB) 0.5-2.5 (3) MG/3ML SOLN Take 3 mLs by nebulization every 6 (six) hours as needed (for cough).   isosorbide  dinitrate (ISORDIL ) 10 MG tablet Take 1 tablet (10 mg total) by mouth 3 (three) times daily.   Lancets 30G MISC CHECK BLOOD SUGAR 4 TIMES A DAY. (BREAKFAST, LUNCH, SUPPER,  & BEDTIME)   lansoprazole  (PREVACID ) 30 MG capsule Take 1 capsule (30 mg total) by mouth 2 (two) times daily before a meal.   loperamide (IMODIUM) 2 MG capsule Take 2-4 mg by mouth See admin instructions. Take 4 mg by mouth now: then 2 mg after each loose stool as needed, do not exceed 5 doses in 24 hours.   losartan  (COZAAR ) 25 MG tablet Take 12.5 mg by mouth daily.   metolazone (ZAROXOLYN) 5 MG tablet Take 5 mg by mouth in the morning.   metoprolol  tartrate (LOPRESSOR ) 50 MG tablet Take 50 mg by mouth 2 (two) times daily.   montelukast  (SINGULAIR ) 10 MG tablet Take 10 mg by mouth at bedtime.   polyethylene glycol powder (GLYCOLAX /MIRALAX ) 17 GM/SCOOP powder Take 17 g by mouth daily as needed for mild constipation or moderate constipation.   potassium chloride  SA (KLOR-CON  M) 20 MEQ tablet Take 20 mEq by mouth daily.   rosuvastatin  (CRESTOR ) 10 MG tablet Take 10 mg by mouth at bedtime.   TRELEGY ELLIPTA 100-62.5-25 MCG/ACT AEPB Inhale 1 puff into the lungs daily.   triamcinolone cream (KENALOG) 0.5 % Apply 1 application  topically every 12 (twelve) hours as needed (applied to bilateral legs for allergic dermatitis).   No facility-administered encounter medications on file as of 04/26/2024.    ALLERGIES:  No Known Allergies  LABORATORY DATA:  I have reviewed the labs as listed.  CBC    Component Value Date/Time   WBC 7.3 04/26/2024 0911   RBC 2.82 (L) 04/26/2024 0911   HGB 8.8 (L) 04/26/2024 0911   HGB 8.3 (L) 01/27/2023 1043   HGB 8.1 (L) 12/28/2022 0905   HCT 28.2 (L) 04/26/2024 0911   HCT 27.9 (L) 12/28/2022 0905   PLT 200 04/26/2024 0911   PLT 272 01/27/2023 1043   PLT 302 12/28/2022 0905   MCV 100.0 04/26/2024 0911   MCV 92 12/28/2022 0905   MCH 31.2 04/26/2024 0911   MCHC 31.2 04/26/2024 0911   RDW 15.9 (H) 04/26/2024 0911   RDW 14.3 12/28/2022 0905   LYMPHSABS 0.9 04/10/2024 1007   LYMPHSABS 0.9 12/28/2022 0905   MONOABS 0.7 04/10/2024 1007   EOSABS 0.3 04/10/2024  1007   EOSABS 0.2 12/28/2022 0905   BASOSABS 0.1 04/10/2024 1007   BASOSABS 0.1 12/28/2022 0905      Latest Ref Rng & Units 04/10/2024   10:07 AM 02/16/2024   12:46 PM 11/09/2023   10:02 AM  CMP  Glucose 70 - 99 mg/dL 807  97  764   BUN 8 - 23 mg/dL 38  52  50   Creatinine 0.44 - 1.00 mg/dL 8.22  7.63  8.11   Sodium 135 - 145 mmol/L 136  137  133   Potassium 3.5 - 5.1 mmol/L 4.1  4.4  3.6   Chloride 98 - 111 mmol/L 98  98  95   CO2 22 - 32 mmol/L 23  27  26    Calcium  8.9 - 10.3 mg/dL 9.3  9.6  9.5   Total Protein  6.5 - 8.1 g/dL 7.3  7.2  7.5   Total Bilirubin 0.0 - 1.2 mg/dL 0.4  0.4  0.6   Alkaline Phos 38 - 126 U/L 77  74  77   AST 15 - 41 U/L 18  16  15    ALT 0 - 44 U/L 8  8  10      DIAGNOSTIC IMAGING:  I have independently reviewed the relevant imaging and discussed with the patient.   WRAP UP:  All questions were answered. The patient knows to call the clinic with any problems, questions or concerns.  Medical decision making: Moderate  Time spent on visit: I spent 20 minutes counseling the patient face to face. The total time spent in the appointment was 30 minutes and more than 50% was on counseling.  Pleasant CHRISTELLA Barefoot, PA-C  04/26/24 12:03 PM  "

## 2024-04-26 ENCOUNTER — Inpatient Hospital Stay: Admitting: Physician Assistant

## 2024-04-26 ENCOUNTER — Inpatient Hospital Stay

## 2024-04-26 ENCOUNTER — Telehealth: Payer: Self-pay | Admitting: Physician Assistant

## 2024-04-26 VITALS — BP 132/68 | HR 70 | Temp 98.9°F | Resp 18 | Ht 62.0 in | Wt 232.0 lb

## 2024-04-26 DIAGNOSIS — D631 Anemia in chronic kidney disease: Secondary | ICD-10-CM

## 2024-04-26 DIAGNOSIS — D500 Iron deficiency anemia secondary to blood loss (chronic): Secondary | ICD-10-CM | POA: Diagnosis not present

## 2024-04-26 DIAGNOSIS — D3A090 Benign carcinoid tumor of the bronchus and lung: Secondary | ICD-10-CM

## 2024-04-26 DIAGNOSIS — N185 Chronic kidney disease, stage 5: Secondary | ICD-10-CM | POA: Diagnosis not present

## 2024-04-26 DIAGNOSIS — E538 Deficiency of other specified B group vitamins: Secondary | ICD-10-CM

## 2024-04-26 DIAGNOSIS — N184 Chronic kidney disease, stage 4 (severe): Secondary | ICD-10-CM

## 2024-04-26 LAB — CBC
HCT: 28.2 % — ABNORMAL LOW (ref 36.0–46.0)
Hemoglobin: 8.8 g/dL — ABNORMAL LOW (ref 12.0–15.0)
MCH: 31.2 pg (ref 26.0–34.0)
MCHC: 31.2 g/dL (ref 30.0–36.0)
MCV: 100 fL (ref 80.0–100.0)
Platelets: 200 10*3/uL (ref 150–400)
RBC: 2.82 MIL/uL — ABNORMAL LOW (ref 3.87–5.11)
RDW: 15.9 % — ABNORMAL HIGH (ref 11.5–15.5)
WBC: 7.3 10*3/uL (ref 4.0–10.5)
nRBC: 0 % (ref 0.0–0.2)

## 2024-04-26 LAB — SAMPLE TO BLOOD BANK

## 2024-04-26 MED ORDER — EPOETIN ALFA-EPBX 20000 UNIT/ML IJ SOLN
20000.0000 [IU] | Freq: Once | INTRAMUSCULAR | Status: AC
  Administered 2024-04-26: 20000 [IU] via SUBCUTANEOUS
  Filled 2024-04-26: qty 1

## 2024-04-26 NOTE — Patient Instructions (Signed)

## 2024-04-26 NOTE — Patient Instructions (Signed)
 [facility] at [facility] **VISIT SUMMARY & IMPORTANT INSTRUCTIONS **   You were seen today by Pleasant Barefoot PA-C for your follow-up visit.    ANEMIA DUE TO BLOOD LOSS: You have on and off bleeding from your stomach.  This causes severe anemia and low iron  levels. Your iron  level currently looks great.  Continue to take iron  tablet every other day. Continue to watch for any signs of bleeding, and continue close follow-up with your gastroenterologist (Dr. Shaaron). Please seek immediate medical attention in the EMERGENCY DEPARTMENT if you have large amounts of black/tarry bowel movements or have symptoms of increasing weakness/fatigue, chest pain, shortness of breath, or altered mental status.  These could be signs of severe anemia.  ANEMIA DUE TO CHRONIC KIDNEY DISEASE: Some of your anemia is also due to your chronic kidney disease, which makes it harder for your body to make new blood cells. To treat your anemia from chronic kidney disease we will continue to give you Retacrit  once a week here at the [facility].  CARCINOID TUMOR OF THE LUNG Since you are having worsening respiratory symptoms and one of your cancer markers (chromogranin A) is trending upward, we will check a special type of CT scan called a PET scan.  This will help to determine if your cancer is growing. We also need to check a 24-hour urine sample. You have also been referred to see Dr. Darlean (pulmonologist / lung doctor).  MEDICATIONS: - Continue every other day iron  tablet. - Please DECREASE your vitamin B12 to take it twice a week  FOLLOW-UP APPOINTMENT: Office visit in 6 to 8 weeks  ** Thank you for trusting me with your healthcare!  I strive to provide all of my patients with quality care at each visit.  If you receive a survey for this visit, I would be so grateful to you for taking the time to provide feedback.  Thank you in advance!  ~ Parissa Chiao                                        Dr.  Mickiel Davonna Pleasant Barefoot, PA-C          Delon Hope, NP   - - - - - - - - - - - - - - - - - -    Thank you for choosing [facility] at [facility] to provide your oncology and hematology care.  To afford each patient quality time with our provider, please arrive at least 15 minutes before your scheduled appointment time.   If you have a lab appointment with the Cancer Center please come in thru the Main Entrance and check in at the main information desk.  You need to re-schedule your appointment should you arrive 10 or more minutes late.  We strive to give you quality time with our providers, and arriving late affects you and other patients whose appointments are after yours.  Also, if you no show three or more times for appointments you may be dismissed from the clinic at the providers discretion.     Again, thank you for choosing [facility].  Our hope is that these requests will decrease the amount of time that you wait before being seen by our physicians.       _____________________________________________________________  Should you  have questions after your visit to [facility], please contact our office at [phone] and follow the prompts.  Our office hours are 8:00 a.m. and 4:30 p.m. Monday - Friday.  Please note that voicemails left after 4:00 p.m. may not be returned until the following business day.  We are closed weekends and major holidays.  You do have access to a nurse 24-7, just call the main number to the clinic [phone] and do not press any options, hold on the line and a nurse will answer the phone.    For prescription refill requests, have your pharmacy contact our office and allow 72 hours.

## 2024-04-26 NOTE — Progress Notes (Signed)
 Patient's Hgb 8.8 and blood pressure stable. patient tolerated Retacrit  injection with no complaints voiced.  Site clean and dry with no bruising or swelling noted at site.  See MAR for details.  Band aid applied.  Patient stable during and after injection.  Vss with discharge and left in satisfactory condition with no s/s of distress noted. All follow ups as scheduled.   [name]

## 2024-04-26 NOTE — Telephone Encounter (Signed)
 Since patient's legal guardian/social worker Chance Pierre was unable to be present at visit today, I called this afternoon to discuss updates.  We discussed results and plans as documented in full office note from 04/26/2024.  All questions and concerns were answered at that time.  Chance did also relate to me that Makayleigh has been considering filling out a DNR, as she reportedly does not want chest compressions or intubation.  I have recommended that they discuss this with Dr. Davonna at follow-up visit in February.  Pleasant CHRISTELLA Barefoot, PA-C 04/26/24 4:15 PM

## 2024-04-28 ENCOUNTER — Ambulatory Visit (HOSPITAL_COMMUNITY)
Admission: RE | Admit: 2024-04-28 | Discharge: 2024-04-28 | Disposition: A | Source: Ambulatory Visit | Attending: Internal Medicine | Admitting: Internal Medicine

## 2024-04-28 ENCOUNTER — Ambulatory Visit: Admitting: Internal Medicine

## 2024-04-28 ENCOUNTER — Encounter: Payer: Self-pay | Admitting: Internal Medicine

## 2024-04-28 VITALS — BP 113/72 | HR 83 | Temp 98.0°F | Ht 63.0 in | Wt 232.0 lb

## 2024-04-28 DIAGNOSIS — C7A090 Malignant carcinoid tumor of the bronchus and lung: Secondary | ICD-10-CM | POA: Diagnosis not present

## 2024-04-28 DIAGNOSIS — Z87891 Personal history of nicotine dependence: Secondary | ICD-10-CM

## 2024-04-28 DIAGNOSIS — J4489 Other specified chronic obstructive pulmonary disease: Secondary | ICD-10-CM | POA: Insufficient documentation

## 2024-04-28 DIAGNOSIS — Z6841 Body Mass Index (BMI) 40.0 and over, adult: Secondary | ICD-10-CM

## 2024-04-28 MED ORDER — BUDESONIDE 0.25 MG/2ML IN SUSP: RESPIRATORY_TRACT | 12 refills | Status: AC

## 2024-04-28 NOTE — Patient Instructions (Addendum)
 Stop trelegy   Continue nebulizer =  duoneb 4 x daily but add budesonide  0.25 mg to 1st and 3rd dose   Please remember to go to the  x-ray department  @  [facility] for your tests - we will call you with the results when they are available     Please schedule a follow up visit in 3 months but call sooner if needed

## 2024-04-28 NOTE — Progress Notes (Unsigned)
 "   [name], female    DOB: [birth year]    MRN: [id number]   Brief patient profile:  10  yowf quit smoking 1998  living in Assisted living  former [facility] Pt self-referred back to pulmonary clinic in [place]  04/28/2024  for follow    Last seen 2022 for carcinoid L lung proximally > turned down for resection by Dr Shyrl as too high risk    CT chest w/o contrast  02/26/24  1. Partially endobronchial left lower lobe primary is similar in size, with mildly increased associated anterior left lower lobe collapse/consolidation. 2. A 1 mm subpleural right lower lobe pulmonary nodule is not readily apparent on the prior and warrants follow-up attention. 3. No thoracic adenopathy or other evidence of metastatic disease. 4. Right adrenal nodule which has been present back to at least 2018 and can be presumed a benign adenoma. No specific follow up indicated.   History of Present Illness  04/28/2024  Pulmonary/ 1st office eval/ [facility] on trelegy  Chief Complaint  Patient presents with   Consult    Referred by Pleasant Barefoot, PA. Pt with hx of Lung CA. Increased SOB and cough x 2 months. She is coughing up some clear sputum.   Dyspnea:  worse since saw Dr Shyrl and decision was made not to intervene with carcinoid   Cough: mucus is slt yellow not bloody / worse in ams  Sleep: 30 degrees one pillow under head and no noct cc  SABA use: once or twice hfa and neb every 4 hours  02 use: none     No obvious day to day or daytime pattern/variability or assoc excess/ purulent sputum or mucus plugs or hemoptysis or cp or chest tightness, subjective wheeze or overt sinus or hb symptoms.    Also denies any obvious fluctuation of symptoms with weather or environmental changes or other aggravating or alleviating factors except as outlined above   No unusual exposure hx or h/o childhood pna/ asthma or knowledge of premature birth.  Current Allergies, Complete Past  Medical History, Past Surgical History, Family History, and Social History were reviewed in [employer] record.  ROS  The following are not active complaints unless bolded Hoarseness, sore throat, dysphagia, dental problems, itching, sneezing,  nasal congestion or discharge of excess mucus or purulent secretions, ear ache,   fever, chills, sweats, unintended wt loss or wt gain, classically pleuritic or exertional cp,  orthopnea pnd or arm/hand swelling  or leg swelling, presyncope, palpitations, abdominal pain, anorexia, nausea, vomiting, diarrhea  or change in bowel habits or change in bladder habits, change in stools or change in urine, dysuria, hematuria,  rash, arthralgias, visual complaints, headache, numbness, weakness or ataxia or problems with walking or coordination,  change in mood or  memory.            Outpatient Medications Prior to Visit  Medication Sig Dispense Refill   acetaminophen  (TYLENOL ) 325 MG tablet Take 325 mg by mouth every 12 (twelve) hours as needed for moderate pain (pain score 4-6).     albuterol  (PROVENTIL  HFA;VENTOLIN  HFA) 108 (90 Base) MCG/ACT inhaler Inhale 2 puffs into the lungs every 4 (four) hours as needed for shortness of breath.     apixaban  (ELIQUIS ) 5 MG TABS tablet Take 1 tablet (5 mg total) by mouth 2 (two) times daily. 60 tablet 3   calcitRIOL  (ROCALTROL ) 0.25 MCG capsule Take 0.25 mcg by mouth every Monday, Wednesday, and Friday.  cetirizine  (ZYRTEC ) 10 MG tablet Take 10 mg by mouth daily.     Cholecalciferol  (VITAMIN D3) 50 MCG (2000 UT) TABS TAKE 1 TABLET BY MOUTH ONCE DAILY. 30 tablet 2   cyanocobalamin  (VITAMIN B12) 1000 MCG tablet Take 1 tablet (1,000 mcg total) by mouth every other day. 90 tablet 1   diltiazem  (CARDIZEM  CD) 240 MG 24 hr capsule Take 1 capsule (240 mg total) by mouth daily. 30 capsule 0   docusate sodium (COLACE) 100 MG capsule Take 100 mg by mouth 2 (two) times daily as needed for mild constipation.      DROPSAFE SAFETY PEN NEEDLES 31G X 6 MM MISC      EASYMAX TEST test strip CHECK BLOOD SUGAR 4 TIMES A DAY. (BREAKFAST, LUNCH, SUPPER & BEDTIME) 200 strip 0   ferrous sulfate  325 (65 FE) MG tablet Take 1 tablet (325 mg total) by mouth every other day. 90 tablet 3   Finerenone  (KERENDIA ) 10 MG TABS Take 10 mg by mouth daily.     fluticasone  (FLONASE ) 50 MCG/ACT nasal spray Place 2 sprays into both nostrils daily as needed for allergies.     folic acid  (FOLVITE ) 1 MG tablet Take 1 tablet (1 mg total) by mouth daily. 60 tablet 2   furosemide  (LASIX ) 20 MG tablet TAKE 1 TABLET BY MOUTH EVERY OTHER DAY,ALTERNATE WITH 40 MG TABLET. 45 tablet 3   furosemide  (LASIX ) 40 MG tablet TAKE 1 TABLET BY MOUTH EVERY OTHER DAY,ALTERNATE WITH 20 MG TABLET. 90 tablet 0   GERI-TUSSIN DM 10-100 MG/5ML liquid Take by mouth.     hydrOXYzine (ATARAX) 10 MG tablet Take 10 mg by mouth 3 (three) times daily as needed.     insulin  regular human CONCENTRATED (HUMULIN  R U-500 KWIKPEN) 500 UNIT/ML KwikPen INJECT 50 UNITS SUBCUTANEOUSLY AT BREAKFAST & SUPPER; WHEN GLUCOSE IS ABOVE 90 & EATING.(HOLD IF BS BELOW 90: CALL MD IF BS ABOVE 400) 12 mL 0   ipratropium-albuterol  (DUONEB) 0.5-2.5 (3) MG/3ML SOLN Take 3 mLs by nebulization every 6 (six) hours as needed (for cough).     isosorbide  dinitrate (ISORDIL ) 10 MG tablet Take 1 tablet (10 mg total) by mouth 3 (three) times daily. 90 tablet 0   Lancets 30G MISC CHECK BLOOD SUGAR 4 TIMES A DAY. (BREAKFAST, LUNCH, SUPPER, & BEDTIME) 100 each 1   lansoprazole  (PREVACID ) 30 MG capsule Take 1 capsule (30 mg total) by mouth 2 (two) times daily before a meal. 60 capsule 11   loperamide (IMODIUM) 2 MG capsule Take 2-4 mg by mouth See admin instructions. Take 4 mg by mouth now: then 2 mg after each loose stool as needed, do not exceed 5 doses in 24 hours.     losartan  (COZAAR ) 25 MG tablet Take 12.5 mg by mouth daily.     metolazone (ZAROXOLYN) 5 MG tablet Take 5 mg by mouth in the morning.      metoprolol  tartrate (LOPRESSOR ) 50 MG tablet Take 50 mg by mouth 2 (two) times daily.     montelukast  (SINGULAIR ) 10 MG tablet Take 10 mg by mouth at bedtime.     polyethylene glycol powder (GLYCOLAX /MIRALAX ) 17 GM/SCOOP powder Take 17 g by mouth daily as needed for mild constipation or moderate constipation.     potassium chloride  SA (KLOR-CON  M) 20 MEQ tablet Take 20 mEq by mouth daily.     rosuvastatin  (CRESTOR ) 10 MG tablet Take 10 mg by mouth at bedtime.     TRELEGY ELLIPTA 100-62.5-25 MCG/ACT AEPB Inhale 1  puff into the lungs daily.     triamcinolone cream (KENALOG) 0.5 % Apply 1 application  topically every 12 (twelve) hours as needed (applied to bilateral legs for allergic dermatitis).     No facility-administered medications prior to visit.    Past Medical History:  Diagnosis Date   Arthritis    Asthma    Atrial fibrillation (HCC)    Carcinoid tumor determined by biopsy of lung (HCC) 05/2018   Left lung   Chronic diastolic CHF (congestive heart failure) (HCC)    CKD (chronic kidney disease), stage III (HCC)    Diabetes mellitus type 2 in obese    Essential hypertension    GERD (gastroesophageal reflux disease)    Heart murmur    Iron  deficiency anemia 10/16/2010   Mental handicap 10/16/2010   Mild CAD 2013   Morbid obesity (HCC)    OSA (obstructive sleep apnea)       Objective:     BP 113/72   Pulse 83   Temp 98 F (36.7 C) (Oral)   Ht 5' 3 (1.6 m) Comment: per pt  Wt 232 lb (105.2 kg)   SpO2 100% Comment: on  RA  BMI 41.10 kg/m   SpO2: 100 % (on  RA) morbidly obese wf upper airway pattern cough   Lung clear - no localized wheeze   CXR PA and Lateral:   04/28/2024 :    I personally reviewed images and impression is as follows:     CM with partial LLL atx     Assessment      "

## 2024-04-29 NOTE — Assessment & Plan Note (Addendum)
 PFts 01/01/21 with ERV 40% at wt 264   Body mass index is 41.1 kg/m.  -  Lab Results  Component Value Date   TSH 2.190 10/06/2022    Contributing to doe and risk of GERD/dvt/ PE  >>>   reviewed the need and the process to achieve and maintain neg calorie balance > defer f/u primary care including intermittently monitoring thyroid  status

## 2024-04-29 NOTE — Assessment & Plan Note (Addendum)
 She was turned down for any form of surgery by Dr Shyrl oin 2022 but has done reasonably well since but still is a very poor candidate at this point for any intrervention  - on the other hand not ready for palliative care either   Therefore rec stepped up f/u here and tackle the end of life issues when approp.           Each maintenance medication was reviewed in detail including emphasizing most importantly the difference between maintenance and prns and under what circumstances the prns are to be triggered using an action plan format where appropriate.  Total time for H and P, chart review, counseling, reviewing neb vs dpi  device(s) and generating customized AVS unique to this office visit / same day charting = 42 min with pt new to me with multiple chronic  refractory respiratory  symptoms.

## 2024-04-29 NOTE — Assessment & Plan Note (Addendum)
 Quit smoking 1998 - PFT's  10/502022  FEV1 1.61 (69 % ) ratio 0.92  p 0 % improvement from saba p BREO prior to study with DLCO  17 (88%%)   and FV curve no obst and ERV 40% at wt 264    No evidence of copd so doesn't need trelegy esp with main symptom being cough  >>> try duoneb qid with alb  0.25 mg bid   >>> f/u 6 weeks

## 2024-05-01 ENCOUNTER — Ambulatory Visit: Payer: Self-pay | Admitting: Internal Medicine

## 2024-05-02 ENCOUNTER — Inpatient Hospital Stay: Attending: Oncology

## 2024-05-02 ENCOUNTER — Inpatient Hospital Stay

## 2024-05-03 ENCOUNTER — Inpatient Hospital Stay

## 2024-05-03 ENCOUNTER — Telehealth: Payer: Self-pay

## 2024-05-03 ENCOUNTER — Inpatient Hospital Stay: Attending: Oncology

## 2024-05-03 NOTE — Telephone Encounter (Signed)
 Copied from CRM [id number]. Topic: Clinical - Prescription Issue >> May 02, 2024 12:30 PM Dedra B wrote: Reason for CRM: Vernell from [facility] needs clarification on patient's budesonide  (PULMICORT ) 0.25 MG/2ML nebulizer solution. Prescription was written to use with her Duoneb twice daily (first and third dose), but Duoneb is written for every 6 hrs as needed. She wants to know what is patient to do if she doesn't need her Duoneb since it's as needed. Patient is in an assisted living facility and they usually require specific instructions. Vernell can  be reached at [phone].   Please advise

## 2024-05-03 NOTE — Telephone Encounter (Signed)
 Called and spoke with [name] fro [facility] and explained Dr Darlean directions of neb solution, she confirmed understanding

## 2024-05-04 ENCOUNTER — Inpatient Hospital Stay

## 2024-05-04 VITALS — BP 113/70 | HR 63 | Temp 97.6°F | Resp 16

## 2024-05-04 DIAGNOSIS — D500 Iron deficiency anemia secondary to blood loss (chronic): Secondary | ICD-10-CM

## 2024-05-04 DIAGNOSIS — N184 Chronic kidney disease, stage 4 (severe): Secondary | ICD-10-CM

## 2024-05-04 DIAGNOSIS — D631 Anemia in chronic kidney disease: Secondary | ICD-10-CM

## 2024-05-04 LAB — CBC
HCT: 30.6 % — ABNORMAL LOW (ref 36.0–46.0)
Hemoglobin: 9.6 g/dL — ABNORMAL LOW (ref 12.0–15.0)
MCH: 30.4 pg (ref 26.0–34.0)
MCHC: 31.4 g/dL (ref 30.0–36.0)
MCV: 96.8 fL (ref 80.0–100.0)
Platelets: 266 10*3/uL (ref 150–400)
RBC: 3.16 MIL/uL — ABNORMAL LOW (ref 3.87–5.11)
RDW: 15.7 % — ABNORMAL HIGH (ref 11.5–15.5)
WBC: 7.5 10*3/uL (ref 4.0–10.5)
nRBC: 0 % (ref 0.0–0.2)

## 2024-05-04 LAB — SAMPLE TO BLOOD BANK

## 2024-05-04 MED ORDER — EPOETIN ALFA-EPBX 20000 UNIT/ML IJ SOLN
20000.0000 [IU] | Freq: Once | INTRAMUSCULAR | Status: AC
  Administered 2024-05-04: 20000 [IU] via SUBCUTANEOUS
  Filled 2024-05-04: qty 1

## 2024-05-04 NOTE — Progress Notes (Signed)
Patient presents today for Retacrit injection. Hemoglobin reviewed prior to administration. VSS tolerated without incident or complaint. See MAR for details. Patient stable during and after injection. Patient discharged in satisfactory condition with no s/s of distress noted.  

## 2024-05-04 NOTE — Patient Instructions (Signed)
 [facility] [place] - A DEPT OF [facility]  Discharge Instructions: Thank you for choosing [facility] to provide your oncology and hematology care.  If you have a lab appointment with the Cancer Center - please note that after April 8th, 2024, all labs will be drawn in the cancer center.  You do not have to check in or register with the main entrance as you have in the past but will complete your check-in in the cancer center.  Wear comfortable clothing and clothing appropriate for easy access to any Portacath or PICC line.   We strive to give you quality time with your provider. You may need to reschedule your appointment if you arrive late (15 or more minutes).  Arriving late affects you and other patients whose appointments are after yours.  Also, if you miss three or more appointments without notifying the office, you may be dismissed from the clinic at the provider's discretion.      For prescription refill requests, have your pharmacy contact our office and allow 72 hours for refills to be completed.    Today you received the following retacrit, return as scheduled.   To help prevent nausea and vomiting after your treatment, we encourage you to take your nausea medication as directed.  BELOW ARE SYMPTOMS THAT SHOULD BE REPORTED IMMEDIATELY: *FEVER GREATER THAN 100.4 F (38 C) OR HIGHER *CHILLS OR SWEATING *NAUSEA AND VOMITING THAT IS NOT CONTROLLED WITH YOUR NAUSEA MEDICATION *UNUSUAL SHORTNESS OF BREATH *UNUSUAL BRUISING OR BLEEDING *URINARY PROBLEMS (pain or burning when urinating, or frequent urination) *BOWEL PROBLEMS (unusual diarrhea, constipation, pain near the anus) TENDERNESS IN MOUTH AND THROAT WITH OR WITHOUT PRESENCE OF ULCERS (sore throat, sores in mouth, or a toothache) UNUSUAL RASH, SWELLING OR PAIN  UNUSUAL VAGINAL DISCHARGE OR ITCHING   Items with * indicate a potential emergency and should be followed up as soon as possible or  go to the Emergency Department if any problems should occur.  Please show the CHEMOTHERAPY ALERT CARD or IMMUNOTHERAPY ALERT CARD at check-in to the Emergency Department and triage nurse.  Should you have questions after your visit or need to cancel or reschedule your appointment, please contact [facility] [place] - A DEPT OF [facility] [phone]  and follow the prompts.  Office hours are 8:00 a.m. to 4:30 p.m. Monday - Friday. Please note that voicemails left after 4:00 p.m. may not be returned until the following business day.  We are closed weekends and major holidays. You have access to a nurse at all times for urgent questions. Please call the main number to the clinic [phone] and follow the prompts.  For any non-urgent questions, you may also contact your provider using MyChart. We now offer e-Visits for anyone 40 and older to request care online for non-urgent symptoms. For details visit [email or web address].   Also download the MyChart app! Go to the app store, search "MyChart", open the app, select [place], and log in with your MyChart username and password.

## 2024-05-10 ENCOUNTER — Inpatient Hospital Stay

## 2024-05-11 ENCOUNTER — Encounter (HOSPITAL_COMMUNITY)

## 2024-05-18 ENCOUNTER — Inpatient Hospital Stay

## 2024-05-18 ENCOUNTER — Inpatient Hospital Stay: Admitting: Oncology

## 2024-07-21 ENCOUNTER — Ambulatory Visit: Admitting: Internal Medicine

## 2024-07-24 ENCOUNTER — Ambulatory Visit: Admitting: "Endocrinology
